# Patient Record
Sex: Female | Born: 1945 | Race: White | Hispanic: No | Marital: Single | Smoking: Former smoker
Health system: Southern US, Community
[De-identification: ages and names within clinical notes are randomized; demographics above are authoritative.]

## PROBLEM LIST (undated history)

## (undated) DIAGNOSIS — E119 Type 2 diabetes mellitus without complications: Secondary | ICD-10-CM

## (undated) DIAGNOSIS — Z9889 Other specified postprocedural states: Secondary | ICD-10-CM

## (undated) DIAGNOSIS — K602 Anal fissure, unspecified: Secondary | ICD-10-CM

## (undated) DIAGNOSIS — I10 Essential (primary) hypertension: Secondary | ICD-10-CM

## (undated) DIAGNOSIS — I2699 Other pulmonary embolism without acute cor pulmonale: Secondary | ICD-10-CM

## (undated) DIAGNOSIS — M899 Disorder of bone, unspecified: Secondary | ICD-10-CM

## (undated) DIAGNOSIS — D3A8 Other benign neuroendocrine tumors: Secondary | ICD-10-CM

## (undated) DIAGNOSIS — E785 Hyperlipidemia, unspecified: Secondary | ICD-10-CM

## (undated) DIAGNOSIS — Z973 Presence of spectacles and contact lenses: Secondary | ICD-10-CM

## (undated) DIAGNOSIS — Z860100 Personal history of colon polyps, unspecified: Secondary | ICD-10-CM

## (undated) DIAGNOSIS — J309 Allergic rhinitis, unspecified: Secondary | ICD-10-CM

## (undated) DIAGNOSIS — R202 Paresthesia of skin: Secondary | ICD-10-CM

## (undated) DIAGNOSIS — N6452 Nipple discharge: Secondary | ICD-10-CM

## (undated) DIAGNOSIS — J45909 Unspecified asthma, uncomplicated: Secondary | ICD-10-CM

## (undated) DIAGNOSIS — K219 Gastro-esophageal reflux disease without esophagitis: Secondary | ICD-10-CM

## (undated) DIAGNOSIS — L409 Psoriasis, unspecified: Secondary | ICD-10-CM

## (undated) DIAGNOSIS — M1990 Unspecified osteoarthritis, unspecified site: Secondary | ICD-10-CM

## (undated) DIAGNOSIS — M419 Scoliosis, unspecified: Secondary | ICD-10-CM

## (undated) DIAGNOSIS — D649 Anemia, unspecified: Secondary | ICD-10-CM

## (undated) DIAGNOSIS — R112 Nausea with vomiting, unspecified: Secondary | ICD-10-CM

## (undated) DIAGNOSIS — G5600 Carpal tunnel syndrome, unspecified upper limb: Secondary | ICD-10-CM

## (undated) DIAGNOSIS — M949 Disorder of cartilage, unspecified: Secondary | ICD-10-CM

## (undated) DIAGNOSIS — I2510 Atherosclerotic heart disease of native coronary artery without angina pectoris: Secondary | ICD-10-CM

## (undated) HISTORY — DX: Gastro-esophageal reflux disease without esophagitis: K21.9

## (undated) HISTORY — DX: Essential (primary) hypertension: I10

## (undated) HISTORY — DX: Type 2 diabetes mellitus without complications: E11.9

## (undated) HISTORY — DX: Unspecified asthma, uncomplicated: J45.909

## (undated) HISTORY — DX: Psoriasis, unspecified: L40.9

## (undated) HISTORY — PX: BREAST EXCISIONAL BIOPSY: SUR124

## (undated) HISTORY — DX: Personal history of colon polyps, unspecified: Z86.0100

## (undated) HISTORY — PX: BREAST BIOPSY: SHX20

## (undated) HISTORY — DX: Hyperlipidemia, unspecified: E78.5

## (undated) HISTORY — DX: Anemia, unspecified: D64.9

## (undated) HISTORY — DX: Allergic rhinitis, unspecified: J30.9

## (undated) HISTORY — DX: Personal history of colonic polyps: Z86.010

## (undated) HISTORY — DX: Disorder of bone, unspecified: M94.9

## (undated) HISTORY — DX: Scoliosis, unspecified: M41.9

## (undated) HISTORY — DX: Disorder of bone, unspecified: M89.9

## (undated) HISTORY — DX: Carpal tunnel syndrome, unspecified upper limb: G56.00

## (undated) HISTORY — DX: Anal fissure, unspecified: K60.2

---

## 1986-02-14 HISTORY — PX: BREAST SURGERY: SHX581

## 1998-06-11 ENCOUNTER — Other Ambulatory Visit: Admission: RE | Admit: 1998-06-11 | Discharge: 1998-06-11 | Payer: Self-pay | Admitting: Obstetrics and Gynecology

## 1999-06-10 ENCOUNTER — Other Ambulatory Visit: Admission: RE | Admit: 1999-06-10 | Discharge: 1999-06-10 | Payer: Self-pay | Admitting: Obstetrics and Gynecology

## 2000-06-12 ENCOUNTER — Other Ambulatory Visit: Admission: RE | Admit: 2000-06-12 | Discharge: 2000-06-12 | Payer: Self-pay | Admitting: Obstetrics and Gynecology

## 2001-12-04 ENCOUNTER — Encounter: Payer: Self-pay | Admitting: Emergency Medicine

## 2001-12-04 ENCOUNTER — Emergency Department (HOSPITAL_COMMUNITY): Admission: EM | Admit: 2001-12-04 | Discharge: 2001-12-04 | Payer: Self-pay | Admitting: Emergency Medicine

## 2002-02-14 HISTORY — PX: OTHER SURGICAL HISTORY: SHX169

## 2002-07-08 ENCOUNTER — Other Ambulatory Visit: Admission: RE | Admit: 2002-07-08 | Discharge: 2002-07-08 | Payer: Self-pay | Admitting: Obstetrics and Gynecology

## 2002-07-19 ENCOUNTER — Ambulatory Visit (HOSPITAL_COMMUNITY): Admission: RE | Admit: 2002-07-19 | Discharge: 2002-07-19 | Payer: Self-pay | Admitting: Gastroenterology

## 2002-09-06 ENCOUNTER — Encounter: Admission: RE | Admit: 2002-09-06 | Discharge: 2002-09-06 | Payer: Self-pay | Admitting: General Surgery

## 2002-09-06 ENCOUNTER — Encounter: Payer: Self-pay | Admitting: General Surgery

## 2003-03-21 ENCOUNTER — Ambulatory Visit (HOSPITAL_BASED_OUTPATIENT_CLINIC_OR_DEPARTMENT_OTHER): Admission: RE | Admit: 2003-03-21 | Discharge: 2003-03-21 | Payer: Self-pay | Admitting: Family Medicine

## 2003-03-21 ENCOUNTER — Encounter: Payer: Self-pay | Admitting: Internal Medicine

## 2003-07-22 ENCOUNTER — Encounter: Admission: RE | Admit: 2003-07-22 | Discharge: 2003-07-22 | Payer: Self-pay | Admitting: Family Medicine

## 2004-01-30 ENCOUNTER — Encounter: Admission: RE | Admit: 2004-01-30 | Discharge: 2004-01-30 | Payer: Self-pay | Admitting: Obstetrics and Gynecology

## 2004-02-20 ENCOUNTER — Ambulatory Visit (HOSPITAL_COMMUNITY): Admission: RE | Admit: 2004-02-20 | Discharge: 2004-02-20 | Payer: Self-pay | Admitting: Gastroenterology

## 2004-07-16 ENCOUNTER — Encounter: Admission: RE | Admit: 2004-07-16 | Discharge: 2004-07-16 | Payer: Self-pay | Admitting: Obstetrics and Gynecology

## 2004-07-28 ENCOUNTER — Encounter: Admission: RE | Admit: 2004-07-28 | Discharge: 2004-07-28 | Payer: Self-pay | Admitting: Obstetrics and Gynecology

## 2005-02-18 ENCOUNTER — Encounter: Admission: RE | Admit: 2005-02-18 | Discharge: 2005-02-18 | Payer: Self-pay | Admitting: Obstetrics and Gynecology

## 2005-09-19 ENCOUNTER — Encounter: Admission: RE | Admit: 2005-09-19 | Discharge: 2005-09-19 | Payer: Self-pay | Admitting: Family Medicine

## 2006-06-15 LAB — CONVERTED CEMR LAB: Pap Smear: NORMAL

## 2006-07-14 ENCOUNTER — Encounter: Payer: Self-pay | Admitting: Internal Medicine

## 2006-09-22 ENCOUNTER — Encounter: Payer: Self-pay | Admitting: Internal Medicine

## 2006-09-22 ENCOUNTER — Encounter: Admission: RE | Admit: 2006-09-22 | Discharge: 2006-09-22 | Payer: Self-pay | Admitting: Obstetrics and Gynecology

## 2006-10-05 ENCOUNTER — Encounter: Admission: RE | Admit: 2006-10-05 | Discharge: 2006-10-05 | Payer: Self-pay | Admitting: Obstetrics and Gynecology

## 2006-11-14 ENCOUNTER — Encounter: Payer: Self-pay | Admitting: Internal Medicine

## 2006-11-15 LAB — HM COLONOSCOPY

## 2007-04-12 ENCOUNTER — Encounter: Admission: RE | Admit: 2007-04-12 | Discharge: 2007-04-12 | Payer: Self-pay | Admitting: Gastroenterology

## 2007-06-21 ENCOUNTER — Encounter: Payer: Self-pay | Admitting: Internal Medicine

## 2007-09-26 ENCOUNTER — Encounter: Payer: Self-pay | Admitting: Internal Medicine

## 2007-11-15 LAB — HM MAMMOGRAPHY: HM Mammogram: NORMAL

## 2007-12-14 ENCOUNTER — Encounter: Admission: RE | Admit: 2007-12-14 | Discharge: 2007-12-14 | Payer: Self-pay | Admitting: Obstetrics and Gynecology

## 2007-12-21 ENCOUNTER — Encounter: Admission: RE | Admit: 2007-12-21 | Discharge: 2007-12-21 | Payer: Self-pay | Admitting: Obstetrics and Gynecology

## 2008-06-03 ENCOUNTER — Ambulatory Visit: Payer: Self-pay | Admitting: Internal Medicine

## 2008-06-03 DIAGNOSIS — E1159 Type 2 diabetes mellitus with other circulatory complications: Secondary | ICD-10-CM | POA: Insufficient documentation

## 2008-06-03 DIAGNOSIS — J45909 Unspecified asthma, uncomplicated: Secondary | ICD-10-CM | POA: Insufficient documentation

## 2008-06-03 DIAGNOSIS — E1169 Type 2 diabetes mellitus with other specified complication: Secondary | ICD-10-CM | POA: Insufficient documentation

## 2008-06-03 DIAGNOSIS — M8580 Other specified disorders of bone density and structure, unspecified site: Secondary | ICD-10-CM | POA: Insufficient documentation

## 2008-06-03 DIAGNOSIS — E119 Type 2 diabetes mellitus without complications: Secondary | ICD-10-CM | POA: Insufficient documentation

## 2008-06-03 DIAGNOSIS — J309 Allergic rhinitis, unspecified: Secondary | ICD-10-CM | POA: Insufficient documentation

## 2008-06-03 DIAGNOSIS — I10 Essential (primary) hypertension: Secondary | ICD-10-CM | POA: Insufficient documentation

## 2008-06-03 DIAGNOSIS — E1122 Type 2 diabetes mellitus with diabetic chronic kidney disease: Secondary | ICD-10-CM | POA: Insufficient documentation

## 2008-06-03 DIAGNOSIS — E785 Hyperlipidemia, unspecified: Secondary | ICD-10-CM | POA: Insufficient documentation

## 2008-06-03 DIAGNOSIS — Z860100 Personal history of colon polyps, unspecified: Secondary | ICD-10-CM | POA: Insufficient documentation

## 2008-06-03 DIAGNOSIS — K219 Gastro-esophageal reflux disease without esophagitis: Secondary | ICD-10-CM | POA: Insufficient documentation

## 2008-06-03 DIAGNOSIS — D649 Anemia, unspecified: Secondary | ICD-10-CM | POA: Insufficient documentation

## 2008-06-03 DIAGNOSIS — G5600 Carpal tunnel syndrome, unspecified upper limb: Secondary | ICD-10-CM | POA: Insufficient documentation

## 2008-06-03 LAB — CONVERTED CEMR LAB: Vit D, 25-Hydroxy: 34 ng/mL (ref 30–89)

## 2008-06-04 LAB — CONVERTED CEMR LAB
ALT: 30 units/L (ref 0–35)
AST: 26 units/L (ref 0–37)
Albumin: 4.2 g/dL (ref 3.5–5.2)
Alkaline Phosphatase: 87 units/L (ref 39–117)
Bilirubin, Direct: 0.2 mg/dL (ref 0.0–0.3)
Cholesterol: 197 mg/dL (ref 0–200)
HDL: 63.1 mg/dL (ref >39.00)
Hgb A1c MFr Bld: 6.2 % (ref 4.6–6.5)
LDL Cholesterol: 113 mg/dL — ABNORMAL HIGH (ref 0–99)
Total Bilirubin: 1.3 mg/dL — ABNORMAL HIGH (ref 0.3–1.2)
Total CHOL/HDL Ratio: 3
Total Protein: 7.8 g/dL (ref 6.0–8.3)
Triglycerides: 106 mg/dL (ref 0.0–149.0)
VLDL: 21.2 mg/dL (ref 0.0–40.0)

## 2008-09-02 LAB — HM DIABETES EYE EXAM: HM Diabetic Eye Exam: NORMAL

## 2008-09-18 ENCOUNTER — Encounter: Payer: Self-pay | Admitting: Internal Medicine

## 2008-10-06 ENCOUNTER — Ambulatory Visit: Payer: Self-pay | Admitting: Internal Medicine

## 2008-10-06 LAB — CONVERTED CEMR LAB: Vit D, 25-Hydroxy: 32 ng/mL (ref 30–89)

## 2008-10-07 ENCOUNTER — Encounter (INDEPENDENT_AMBULATORY_CARE_PROVIDER_SITE_OTHER): Payer: Self-pay | Admitting: *Deleted

## 2008-10-07 LAB — CONVERTED CEMR LAB
ALT: 28 units/L (ref 0–35)
AST: 23 units/L (ref 0–37)
Albumin: 4.2 g/dL (ref 3.5–5.2)
Alkaline Phosphatase: 73 units/L (ref 39–117)
BUN: 20 mg/dL (ref 6–23)
Basophils Absolute: 0.1 10*3/uL (ref 0.0–0.1)
Basophils Relative: 1.9 % (ref 0.0–3.0)
Bilirubin Urine: NEGATIVE
Bilirubin, Direct: 0.1 mg/dL (ref 0.0–0.3)
CO2: 29 meq/L (ref 19–32)
Calcium: 9.8 mg/dL (ref 8.4–10.5)
Chloride: 105 meq/L (ref 96–112)
Cholesterol: 215 mg/dL — ABNORMAL HIGH (ref 0–200)
Creatinine, Ser: 0.8 mg/dL (ref 0.4–1.2)
Direct LDL: 127.3 mg/dL
Eosinophils Absolute: 0.3 10*3/uL (ref 0.0–0.7)
Eosinophils Relative: 4.7 % (ref 0.0–5.0)
GFR calc non Af Amer: 76.52 mL/min (ref >60)
Glucose, Bld: 112 mg/dL — ABNORMAL HIGH (ref 70–99)
HCT: 37.8 % (ref 36.0–46.0)
HDL: 54 mg/dL (ref >39.00)
Hemoglobin: 13.2 g/dL (ref 12.0–15.0)
Hgb A1c MFr Bld: 6 % (ref 4.6–6.5)
Ketones, ur: NEGATIVE mg/dL
Leukocytes, UA: NEGATIVE
Lymphocytes Relative: 27.5 % (ref 12.0–46.0)
Lymphs Abs: 1.5 10*3/uL (ref 0.7–4.0)
MCHC: 34.9 g/dL (ref 30.0–36.0)
MCV: 94.6 fL (ref 78.0–100.0)
Monocytes Absolute: 0.4 10*3/uL (ref 0.1–1.0)
Monocytes Relative: 7.4 % (ref 3.0–12.0)
Neutro Abs: 3.2 10*3/uL (ref 1.4–7.7)
Neutrophils Relative %: 58.5 % (ref 43.0–77.0)
Nitrite: NEGATIVE
Platelets: 243 10*3/uL (ref 150.0–400.0)
Potassium: 3.9 meq/L (ref 3.5–5.1)
RBC: 3.99 M/uL (ref 3.87–5.11)
RDW: 12.8 % (ref 11.5–14.6)
Sodium: 143 meq/L (ref 135–145)
Specific Gravity, Urine: <=1.005 (ref 1.000–1.030)
TSH: 3.03 microintl units/mL (ref 0.35–5.50)
Total Bilirubin: 1.3 mg/dL — ABNORMAL HIGH (ref 0.3–1.2)
Total CHOL/HDL Ratio: 4
Total Protein, Urine: NEGATIVE mg/dL
Total Protein: 8.5 g/dL — ABNORMAL HIGH (ref 6.0–8.3)
Triglycerides: 141 mg/dL (ref 0.0–149.0)
Urine Glucose: NEGATIVE mg/dL
Urobilinogen, UA: 0.2 (ref 0.0–1.0)
VLDL: 28.2 mg/dL (ref 0.0–40.0)
WBC: 5.5 10*3/uL (ref 4.5–10.5)
pH: 6 (ref 5.0–8.0)

## 2009-03-03 ENCOUNTER — Emergency Department (HOSPITAL_COMMUNITY): Admission: EM | Admit: 2009-03-03 | Discharge: 2009-03-03 | Payer: Self-pay | Admitting: Emergency Medicine

## 2009-03-09 ENCOUNTER — Ambulatory Visit: Payer: Self-pay | Admitting: Internal Medicine

## 2009-03-09 DIAGNOSIS — S0100XA Unspecified open wound of scalp, initial encounter: Secondary | ICD-10-CM | POA: Insufficient documentation

## 2009-06-26 ENCOUNTER — Encounter: Admission: RE | Admit: 2009-06-26 | Discharge: 2009-06-26 | Payer: Self-pay | Admitting: Obstetrics and Gynecology

## 2009-10-13 ENCOUNTER — Ambulatory Visit: Payer: Self-pay | Admitting: Internal Medicine

## 2009-10-13 ENCOUNTER — Encounter: Payer: Self-pay | Admitting: Internal Medicine

## 2009-10-13 DIAGNOSIS — R5381 Other malaise: Secondary | ICD-10-CM | POA: Insufficient documentation

## 2009-10-13 DIAGNOSIS — R5383 Other fatigue: Secondary | ICD-10-CM

## 2009-10-13 LAB — CONVERTED CEMR LAB
ALT: 38 units/L — ABNORMAL HIGH (ref 0–35)
AST: 30 units/L (ref 0–37)
Albumin: 4.2 g/dL (ref 3.5–5.2)
Alkaline Phosphatase: 79 units/L (ref 39–117)
BUN: 21 mg/dL (ref 6–23)
Basophils Absolute: 0.1 10*3/uL (ref 0.0–0.1)
Basophils Relative: 1.4 % (ref 0.0–3.0)
Bilirubin Urine: NEGATIVE
Bilirubin, Direct: 0.1 mg/dL (ref 0.0–0.3)
CO2: 26 meq/L (ref 19–32)
Calcium: 9.6 mg/dL (ref 8.4–10.5)
Chloride: 105 meq/L (ref 96–112)
Cholesterol: 271 mg/dL — ABNORMAL HIGH (ref 0–200)
Creatinine, Ser: 0.8 mg/dL (ref 0.4–1.2)
Direct LDL: 172 mg/dL
Eosinophils Absolute: 0.2 10*3/uL (ref 0.0–0.7)
Eosinophils Relative: 4.1 % (ref 0.0–5.0)
GFR calc non Af Amer: 80.93 mL/min (ref >60)
Glucose, Bld: 102 mg/dL — ABNORMAL HIGH (ref 70–99)
HCT: 37.9 % (ref 36.0–46.0)
HDL: 45.8 mg/dL (ref >39.00)
Hemoglobin: 13.2 g/dL (ref 12.0–15.0)
Hgb A1c MFr Bld: 6.2 % (ref 4.6–6.5)
Ketones, ur: NEGATIVE mg/dL
Leukocytes, UA: NEGATIVE
Lymphocytes Relative: 25.3 % (ref 12.0–46.0)
Lymphs Abs: 1.4 10*3/uL (ref 0.7–4.0)
MCHC: 34.8 g/dL (ref 30.0–36.0)
MCV: 94 fL (ref 78.0–100.0)
Monocytes Absolute: 0.4 10*3/uL (ref 0.1–1.0)
Monocytes Relative: 6.8 % (ref 3.0–12.0)
Neutro Abs: 3.5 10*3/uL (ref 1.4–7.7)
Neutrophils Relative %: 62.4 % (ref 43.0–77.0)
Nitrite: NEGATIVE
Platelets: 261 10*3/uL (ref 150.0–400.0)
Potassium: 4.6 meq/L (ref 3.5–5.1)
RBC: 4.03 M/uL (ref 3.87–5.11)
RDW: 13.8 % (ref 11.5–14.6)
Sodium: 142 meq/L (ref 135–145)
Specific Gravity, Urine: <=1.005 (ref 1.000–1.030)
TSH: 2.42 microintl units/mL (ref 0.35–5.50)
Total Bilirubin: 0.8 mg/dL (ref 0.3–1.2)
Total CHOL/HDL Ratio: 6
Total Protein, Urine: NEGATIVE mg/dL
Total Protein: 7.2 g/dL (ref 6.0–8.3)
Triglycerides: 207 mg/dL — ABNORMAL HIGH (ref 0.0–149.0)
Urine Glucose: NEGATIVE mg/dL
Urobilinogen, UA: 0.2 (ref 0.0–1.0)
VLDL: 41.4 mg/dL — ABNORMAL HIGH (ref 0.0–40.0)
WBC: 5.6 10*3/uL (ref 4.5–10.5)
pH: 6 (ref 5.0–8.0)

## 2009-10-14 ENCOUNTER — Encounter (INDEPENDENT_AMBULATORY_CARE_PROVIDER_SITE_OTHER): Payer: Self-pay | Admitting: *Deleted

## 2009-10-14 LAB — CONVERTED CEMR LAB: Vit D, 25-Hydroxy: 35 ng/mL (ref 30–89)

## 2010-03-07 ENCOUNTER — Encounter: Payer: Self-pay | Admitting: Obstetrics and Gynecology

## 2010-03-16 NOTE — Assessment & Plan Note (Signed)
Summary: cpx,uhc,$50,cd   Vital Signs:  Patient profile:   65 year old female Height:      62 inches (157.48 cm) Weight:      134.0 pounds (60.91 kg) O2 Sat:      98 % Temp:     98.7 degrees F (37.06 degrees C) oral Pulse rate:   76 / minute BP sitting:   132 / 94  (left arm) Cuff size:   regular  Vitals Entered By: Orlan Leavens (October 06, 2008 8:16 AM) CC: CPX Is Patient Diabetic? Yes  Pain Assessment Patient in pain? no        Primary Care Provider:  Newt Lukes MD  CC:  CPX.  History of Present Illness: patient is here today for annual physical. Patient feels well and has no complaints.  gyn exam sched next mo, eye exam last mo, mammo done <81yr/colo done <10y no problems with meds  not compliant with diet/exercise since last visit  Preventive Screening-Counseling & Management  Alcohol-Tobacco     Alcohol drinks/day: 0     Smoking Status: quit > 6 months     Year Quit: 1976  Clinical Review Panels:  Prevention   Last Mammogram:  normal (11/15/2007)   Last Pap Smear:  normal (06/15/2006)   Last Colonoscopy:  Polyp (11/15/2006)  Immunizations   Last Tetanus Booster:  Td (02/15/2004)   Last Pneumovax:  given (02/14/2002)  Lipid Management   Cholesterol:  197 (06/03/2008)   LDL (bad choesterol):  113 (06/03/2008)   HDL (good cholesterol):  63.10 (06/03/2008)  Diabetes Management   HgBA1C:  6.2 (06/03/2008)   Last Dilated Eye Exam:  normal (09/02/2008)   Last Foot Exam:  yes (10/06/2008)   Last Pneumovax:  given (02/14/2002)   Current Medications (verified): 1)  Pravastatin Sodium 20 Mg Tabs (Pravastatin Sodium) .... Take 1 By Mouth Qd 2)  Prilosec 20 Mg Cpdr (Omeprazole) .... Take 1 By Mouth Qd 3)  Vitamin D3 1000 Unit Tabs (Cholecalciferol) .... Take 1 By Mouth Qd 4)  Coq10 200 Mg Caps (Coenzyme Q10) .... Take 1 By Mouth Qd 5)  Oscal 500/200 D-3 500-200 Mg-Unit Tabs (Calcium-Vitamin D) .... Take 1 By Mouth Qd 6)  Afrin Nasal Spray 0.05 % Soln  (Oxymetazoline Hcl) .... Use Once Daily 7)  Finacea 15 % Gel (Azelaic Acid) .... Apply Once Daily 8)  Clobetasol Propionate 0.05 % Soln (Clobetasol Propionate) .... Use Two Times A Day 9)  Capex 0.01 % Sham (Fluocinolone Acetonide) .... Use Two Times A Week 10)  Benicar 20 Mg Tabs (Olmesartan Medoxomil) .Marland Kitchen.. 1 By Mouth Daily 11)  Halobetasol Propionate 0.05 % Crea (Halobetasol Propionate) .... Apply 3-4 Times A Week 12)  Nasonex 50 Mcg/act Susp (Mometasone Furoate) .... Use 2-3 Times Q Week 13)  Omega 3 1280mg   Mg Cpdr (Omega-3 Fatty Acids) .... Take 2 By Mouth Qd  Allergies (verified): 1)  ! * Biaxin 2)  ! * E-Mycins 3)  ! Tetracycline 4)  ! Codeine PMH-FH-SH reviewed-no changes except otherwise noted  Review of Systems       see HPI above. I have reviewed all other systems and they were negative.   Physical Exam  General:  alert, well-developed, well-nourished, and cooperative to examination.  slightly overweight-appearing.   Eyes:  vision grossly intact; pupils equal, round and reactive to light.  conjunctiva and lids normal.   ears corrective lenses Ears:  normal pinnae bilaterally, without erythema, swelling, or tenderness to palpation. TMs clear, without effusion,  or cerumen impaction. Hearing grossly normal bilaterally  Mouth:  Oral mucosa and oropharynx without lesions or exudates.  Teeth in good repair. Lungs:  normal respiratory effort, no intercostal retractions or use of accessory muscles; normal breath sounds bilaterally - no crackles and no wheezes.    Heart:  normal rate, regular rhythm, no murmur, and no rub. BLE without edema. normal DP pulses and normal cap refill in all 4 extremities    Abdomen:  soft, non-tender, normal bowel sounds, no distention; no masses and no appreciable hepatomegaly or splenomegaly.   Rectal:  defer to gyn Genitalia:  defer to gyn Msk:  no gross deformities or acute effusions -  Neurologic:  alert & oriented X3 and cranial nerves II-XII  symetrically intact.  strength normal in all extremities, sensation intact to light touch, and gait normal. speech fluent without dysarthria or aphasia  follows commands with good comprehension.  Skin:  no rashes, vesicles, ulcers, or erythema. No nodules or irregularity to palpation. smalll AK L lateral thigh without irritation Cervical Nodes:  No lymphadenopathy noted Axillary Nodes:  No palpable lymphadenopathy Inguinal Nodes:  No significant adenopathy Psych:  Oriented X3, memory intact for recent and remote, normally interactive, good eye contact, not anxious appearing, not depressed appearing, and not agitated.     Diabetes Management Exam:    Foot Exam (with socks and/or shoes not present):       Sensory-Pinprick/Light touch:          Left medial foot (L-4): normal          Left dorsal foot (L-5): normal          Left lateral foot (S-1): normal          Right medial foot (L-4): normal          Right dorsal foot (L-5): normal          Right lateral foot (S-1): normal       Sensory-Monofilament:          Left foot: normal          Right foot: normal       Inspection:          Left foot: normal          Right foot: normal       Nails:          Left foot: normal          Right foot: normal    Eye Exam:       Eye Exam done elsewhere          Date: 09/02/2008          Results: normal          Done by: dr. Rondel Baton   Impression & Recommendations:  Problem # 1:  PREVENTIVE HEALTH CARE (ICD-V70.0) Patient has been counseled on age-appropriate routine health concerns for screening and prevention.  These are reviewed and up-to-date.  Immunizations are up-to-date or declined. Labs ordered today  and ECG reviewed.   Problem # 2:  HYPERTENSION (ICD-401.9)  Her updated medication list for this problem includes:    Benicar 20 Mg Tabs (Olmesartan medoxomil) .Marland Kitchen... 1 by mouth daily  Orders: EKG w/ Interpretation (93000)  BP today: 132/94 Prior BP: 132/98 (06/03/2008)  Labs  Reviewed: Chol: 197 (06/03/2008)   HDL: 63.10 (06/03/2008)   LDL: 113 (06/03/2008)   TG: 106.0 (06/03/2008)  Problem # 3:  HYPERLIPIDEMIA (ICD-272.4)  Her updated medication list for this  problem includes:    Pravastatin Sodium 20 Mg Tabs (Pravastatin sodium) .Marland Kitchen... Take 1 by mouth qd  Labs Reviewed: SGOT: 26 (06/03/2008)   SGPT: 30 (06/03/2008)   HDL:63.10 (06/03/2008)  LDL:113 (06/03/2008)  Chol:197 (06/03/2008)  Trig:106.0 (06/03/2008)  Problem # 4:  CARPAL TUNNEL SYNDROME (ICD-354.0) still occ L hand symptoms but not frequwnt or troublesome to everyday life cont as needed survvellience w/o PNCS at this time  Problem # 5:  DIABETES MELLITUS, TYPE II (ICD-250.00)  Her updated medication list for this problem includes:    Benicar 20 Mg Tabs (Olmesartan medoxomil) .Marland Kitchen... 1 by mouth daily  Orders: TLB-A1C / Hgb A1C (Glycohemoglobin) (83036-A1C)  Last Eye Exam: normal (09/02/2008) Reviewed HgBA1c results: 6.2 (06/03/2008)  Complete Medication List: 1)  Pravastatin Sodium 20 Mg Tabs (Pravastatin sodium) .... Take 1 by mouth qd 2)  Prilosec 20 Mg Cpdr (Omeprazole) .... Take 1 by mouth qd 3)  Vitamin D3 1000 Unit Tabs (Cholecalciferol) .... Take 1 by mouth qd 4)  Coq10 200 Mg Caps (Coenzyme q10) .... Take 1 by mouth qd 5)  Oscal 500/200 D-3 500-200 Mg-unit Tabs (Calcium-vitamin d) .... Take 1 by mouth qd 6)  Afrin Nasal Spray 0.05 % Soln (Oxymetazoline hcl) .... Use once daily 7)  Finacea 15 % Gel (Azelaic acid) .... Apply once daily 8)  Clobetasol Propionate 0.05 % Soln (Clobetasol propionate) .... Use two times a day 9)  Capex 0.01 % Sham (Fluocinolone acetonide) .... Use two times a week 10)  Benicar 20 Mg Tabs (Olmesartan medoxomil) .Marland Kitchen.. 1 by mouth daily 11)  Halobetasol Propionate 0.05 % Crea (Halobetasol propionate) .... Apply 3-4 times a week 12)  Nasonex 50 Mcg/act Susp (Mometasone furoate) .... Use 2-3 times q week 13)  Omega 3 1280mg  Mg Cpdr (omega-3 Fatty Acids)  .... Take  2 by mouth qd 14)  Onetouch Test Strp (Glucose blood) .... Check two times a day 15)  Onetouch Lancets Misc (Lancets) .... Use as directed  Other Orders: TLB-CBC Platelet - w/Differential (85025-CBCD) TLB-TSH (Thyroid Stimulating Hormone) (84443-TSH) TLB-Lipid Panel (80061-LIPID) TLB-BMP (Basic Metabolic Panel-BMET) (80048-METABOL) TLB-Hepatic/Liver Function Pnl (80076-HEPATIC) T-Vitamin D (25-Hydroxy) ([address]) TLB-Udip ONLY (81003-UDIP)  Patient Instructions: 1)  physical labs today - you will be mailed a copy of these for review 2)  followup 3-4 months, sooner if problems 3)  refills on meds as discussed today Prescriptions: ONETOUCH LANCETS  MISC (LANCETS) use as directed  #100 x 11   Entered by:   Orlan Leavens   Authorized by:   Newt Lukes MD   Signed by:   Orlan Leavens on 10/06/2008   Method used:   Faxed to ...       [facility] (retail)       [address] [place]       [address]       [place], [place]  [address]       Ph: [phone]       Fax: [phone]   RxID:   [id number] [facility] TEST  STRP (GLUCOSE BLOOD) check two times a day  #100 x 11   Entered by:   Orlan Leavens   Authorized by:   Newt Lukes MD   Signed by:   Orlan Leavens on 10/06/2008   Method used:   Faxed to ...       [facility] (retail)       [address] [employer]       [address]  [place], [place]  [address]       Ph: [phone]       Fax: [phone]   RxID:   [id number] NASONEX 50 MCG/ACT SUSP (MOMETASONE FUROATE) use 2-3 times q week  #1 x 5   Entered and Authorized by:   Newt Lukes MD   Signed by:   Newt Lukes MD on 10/06/2008   Method used:   Faxed to ...       [facility] (retail)       [address] [place]       [address]       [place], [place]  [address]       Ph: [phone]       Fax: [phone]   RxID:   [id number] BENICAR 20 MG TABS (OLMESARTAN MEDOXOMIL) 1 by mouth daily  #30 x 11   Entered and Authorized by:   Newt Lukes MD    Signed by:   Newt Lukes MD on 10/06/2008   Method used:   Faxed to ...       [facility] (retail)       [address] [place]       [address]       [place], [place]  [address]       Ph: [phone]       Fax: [phone]   RxID:   [id number] PRAVASTATIN SODIUM 20 MG TABS (PRAVASTATIN SODIUM) TAKE 1 by mouth QD  #30 x 11   Entered and Authorized by:   Newt Lukes MD   Signed by:   Newt Lukes MD on 10/06/2008   Method used:   Faxed to ...       [facility] (retail)       [address] [place]       [address]       [place], [place]  [address]       Ph: [phone]       Fax: [phone]   RxID:   [id number]

## 2010-03-16 NOTE — Letter (Signed)
Summary: Lipid Letter  [facility]  [address] [place], [place] [address]   Phone: [phone]  Fax: [phone]    10/07/2008  Jennise Both [address] [place], [place]  [address]  Dear Ms. [place]mouth:  We have carefully reviewed your last lipid profile from 10/06/08 and the results are noted below with a summary of recommendations for lipid management.    Cholesterol:       215       HDL "good" Cholesterol:   57.84       LDL "bad" Cholesterol:   113       Triglycerides:       141.0      Labs are all within appropriate range-no medicine changes recommended. Attached a copy of your labs for your record.    LIFESTYLE RECOMMENDATIONS   TLC Diet (Therapeutic Lifestyle Change): Saturated Fats & Transfatty acids should be kept < 7% of total calories ***Reduce Saturated Fats Polyunstaurated Fat can be up to 10% of total calories Monounsaturated Fat Fat can be up to 20% of total calories Total Fat should be no greater than 25-35% of total calories Carbohydrates should be 50-60% of total calories Protein should be approximately 15% of total calories Fiber should be at least 20-30 grams a day ***Increased fiber may help lower LDL Total Cholesterol should be < 200mg /day Consider adding plant stanol/sterols to diet (example: Benacol spread) ***A higher intake of unsaturated fat may reduce Triglycerides and Increase HDL    Adjunctive Measures (may lower LIPIDS and reduce risk of Heart Attack) include: Aerobic Exercise (20-30 minutes 3-4 times a week) Limit Alcohol Consumption Weight Reduction Aspirin 75-81 mg a day by mouth (if not allergic or contraindicated) Dietary Fiber 20-30 grams a day by mouth     Current Medications: 1)    Pravastatin Sodium 20 Mg Tabs (Pravastatin sodium) .... Take 1 by mouth qd 2)    Prilosec 20 Mg Cpdr (Omeprazole) .... Take 1 by mouth qd 3)    Vitamin D3 1000 Unit Tabs (Cholecalciferol) .... Take 1 by mouth qd 4)     Coq10 200 Mg Caps (Coenzyme q10) .... Take 1 by mouth qd 5)    Oscal 500/200 D-3 500-200 Mg-unit Tabs (Calcium-vitamin d) .... Take 1 by mouth qd 6)    Afrin Nasal Spray 0.05 % Soln (Oxymetazoline hcl) .... Use once daily 7)    Finacea 15 % Gel (Azelaic acid) .... Apply once daily 8)    Clobetasol Propionate 0.05 % Soln (Clobetasol propionate) .... Use two times a day 9)    Capex 0.01 % Sham (Fluocinolone acetonide) .... Use two times a week 10)    Benicar 20 Mg Tabs (Olmesartan medoxomil) .Marland Kitchen.. 1 by mouth daily 11)    Halobetasol Propionate 0.05 % Crea (Halobetasol propionate) .... Apply 3-4 times a week 12)    Nasonex 50 Mcg/act Susp (Mometasone furoate) .... Use 2-3 times q week 13)    Omega 3 1280mg   Mg Cpdr (omega-3 Fatty Acids)  .... Take 2 by mouth qd 14)    Onetouch Test  Strp (Glucose blood) .... Check two times a day 15)    Onetouch Lancets  Misc (Lancets) .... Use as directed  If you have any questions, please call. We appreciate being able to work with you.   Sincerely,    [facility] Newt Lukes MD

## 2010-03-16 NOTE — Letter (Signed)
Summary: Mazzocchi,MD  Mazzocchi,MD   Imported By: Lester Webster 10/21/2008 10:53:00  _____________________________________________________________________  External Attachment:    Type:   Image     Comment:   External Document

## 2010-03-16 NOTE — Assessment & Plan Note (Signed)
Summary: YEARLY FU/ MEDICARE/ LABS SAME DAY /NWS  #   Vital Signs:  Patient profile:   65 year old female Height:      62 inches (157.48 cm) Weight:      137.0 pounds (62.27 kg) O2 Sat:      96 % on Room air Temp:     98.3 degrees F (36.83 degrees C) oral Pulse rate:   80 / minute BP sitting:   122 / 84  (left arm) Cuff size:   regular  Vitals Entered By: Orlan Leavens RMA (October 13, 2009 8:18 AM)  O2 Flow:  Room air CC: Yearly CPX Is Patient Diabetic? Yes Did you bring your meter with you today? No Pain Assessment Patient in pain? no       Does patient need assistance? Functional Status Self care Ambulation Normal Comments Need renewal on all meds   Primary Care Provider:  Newt Lukes MD  CC:  Yearly CPX.  History of Present Illness: Here for wellness Diet: Heart Healthy or DM if diabetic Physical Activities: Sedentary Depression/mood screen: Negative Hearing:grosslly normal Visual Acuity: Grossly normal, wears reading glasses, sees optho yearly  ADL's: Capable  Fall Risk: None Home Safety: Good End-of-Life Planning: Advance directive - Full code/I agree  also reviewed chronic med issues:  HTN - concerned her BP has been running higher than usual - stopped all her meds mid Nov 2010 due to muscle and bone fatigue - since head trauma 02/2009, feels BP running higher than usual so resumed benicar- no CP, or vision changes; mild occassional right side HA (relieved with ibuprofen)- mild fatigue and muscle c/o persist wether on or off medications  dyslipidemia - feels omega 3 increases her bleeding risk -  increase in telectasias (face, arms) while on this medication and esp while on ibuprofen did not feel different when off pravastatin (re: muscle fatigue) so resumed same last OV  DM2 - diet controlled  Preventive Screening-Counseling & Management  Alcohol-Tobacco     Alcohol drinks/day: 0     Smoking Status: quit > 6 months     Year Quit:  1976  Caffeine-Diet-Exercise     Caffeine use/day: 1     Caffeine Counseling: not indicated; caffeine use is not excessive or problematic     Does Patient Exercise: yes     Type of exercise: walk     Exercise (avg: min/session): 30-60     Times/week: 5     Exercise Counseling: not indicated; exercise is adequate     Depression Counseling: not indicated; screening negative for depression  Safety-Violence-Falls     Violence Counseling: not applicable     Fall Risk Counseling: not indicated; no significant falls noted  Clinical Review Panels:  Prevention   Last Mammogram:  normal (11/15/2007)   Last Pap Smear:  normal (06/15/2006)   Last Colonoscopy:  Polyp (11/15/2006)  Immunizations   Last Tetanus Booster:  Td (02/15/2004)   Last Pneumovax:  given (02/14/2002)  Lipid Management   Cholesterol:  215 (10/06/2008)   LDL (bad choesterol):  113 (06/03/2008)   HDL (good cholesterol):  54.00 (10/06/2008)  Diabetes Management   HgBA1C:  6.0 (10/06/2008)   Creatinine:  0.8 (10/06/2008)   Last Dilated Eye Exam:  normal (09/02/2008)   Last Foot Exam:  yes (10/13/2009)   Last Pneumovax:  given (02/14/2002)  CBC   WBC:  5.5 (10/06/2008)   RBC:  3.99 (10/06/2008)   Hgb:  13.2 (10/06/2008)   Hct:  37.8 (10/06/2008)  Platelets:  243.0 (10/06/2008)   MCV  94.6 (10/06/2008)   MCHC  34.9 (10/06/2008)   RDW  12.8 (10/06/2008)   PMN:  58.5 (10/06/2008)   Lymphs:  27.5 (10/06/2008)   Monos:  7.4 (10/06/2008)   Eosinophils:  4.7 (10/06/2008)   Basophil:  1.9 (10/06/2008)  Complete Metabolic Panel   Glucose:  112 (10/06/2008)   Sodium:  143 (10/06/2008)   Potassium:  3.9 (10/06/2008)   Chloride:  105 (10/06/2008)   CO2:  29 (10/06/2008)   BUN:  20 (10/06/2008)   Creatinine:  0.8 (10/06/2008)   Albumin:  4.2 (10/06/2008)   Total Protein:  8.5 (10/06/2008)   Calcium:  9.8 (10/06/2008)   Total Bili:  1.3 (10/06/2008)   Alk Phos:  73 (10/06/2008)   SGPT (ALT):  28 (10/06/2008)    SGOT (AST):  23 (10/06/2008)   Current Medications (verified): 1)  Afrin Nasal Spray 0.05 % Soln (Oxymetazoline Hcl) .... Use Once Daily 2)  Finacea 15 % Gel (Azelaic Acid) .... Apply Once Daily 3)  Clobetasol Propionate 0.05 % Soln (Clobetasol Propionate) .... Use Two Times A Day 4)  Capex 0.01 % Sham (Fluocinolone Acetonide) .... Use Two Times A Week 5)  Halobetasol Propionate 0.05 % Crea (Halobetasol Propionate) .... Apply 3-4 Times A Week 6)  Nasonex 50 Mcg/act Susp (Mometasone Furoate) .... Use 2-3 Times Q Week 7)  Onetouch Test  Strp (Glucose Blood) .... Check Two Times A Day 8)  Onetouch Lancets  Misc (Lancets) .... Use As Directed 9)  Prilosec 20 Mg Cpdr (Omeprazole) .... Take 1 By Mouth Qd 10)  Benicar 20 Mg Tabs (Olmesartan Medoxomil) .... 2 By Mouth Daily (Or As Directed) 11)  Pravastatin Sodium 20 Mg Tabs (Pravastatin Sodium) .Marland Kitchen.. 1 By Mouth Once Daily 12)  Aspirin 81 Mg Tabs (Aspirin) .... Take 1 By Mouth Once Daily 13)  Multivitamins  Tabs (Multiple Vitamin) .... Take 1 By Mouth Once Daily  Allergies (verified): 1)  ! * Biaxin 2)  ! * E-Mycins 3)  ! Tetracycline 4)  ! Codeine  Past History:  Past medical, surgical, family and social histories (including risk factors) reviewed, and no changes noted (except as noted below).  Past Medical History: Allergic rhinitis Anemia-NOS Asthma Colonic polyps, hx of Diabetes mellitus, type II, diet controlled GERD Hyperlipidemia Hypertension Rectal fissue  Past Surgical History: Reviewed history from 06/03/2008 and no changes required. Caesarean section Breast biopsy (1988) Benign rectal growth in 2004 removed by Dr.Rosenbower  Family History: Reviewed history from 06/03/2008 and no changes required. Family History of Arthritis (parents) Family History of Asthma (parent & other blood relative) Family History Breast cancer 1st degree relative <50 (other blood relative) Family History Hypertension (parent & other blood  relative) Family History Lung cancer (father) Family History of Sudden Death (father died at age 88 due to lung cancer) Heart Disease (parent & other blood relative)  Social History: Reviewed history from 06/03/2008 and no changes required. lives alone Remote smoker, none since 22. No alcohol. Retired Child psychotherapist  Review of Systems       see HPI above. I have reviewed all other systems and they were negative.   Physical Exam  General:  alert, well-developed, well-nourished, and cooperative to examination.  slightly overweight-appearing.   Head:  Normocephalic and atraumatic without obvious abnormalities. No apparent alopecia or balding. Eyes:  vision grossly intact; pupils equal, round and reactive to light.  conjunctiva and lids normal.   ears corrective lenses Ears:  normal pinnae bilaterally,  without erythema, swelling, or tenderness to palpation. TMs clear, without effusion, or cerumen impaction. Hearing grossly normal bilaterally  Mouth:  Oral mucosa and oropharynx without lesions or exudates.  Teeth in good repair. Neck:  supple, full ROM, no masses, no thyromegaly; no thyroid nodules or tenderness. no JVD or carotid bruits.   Lungs:  normal respiratory effort, no intercostal retractions or use of accessory muscles; normal breath sounds bilaterally - no crackles and no wheezes.    Heart:  normal rate, regular rhythm, no murmur, and no rub. BLE without edema. normal DP pulses and normal cap refill in all 4 extremities    Abdomen:  soft, non-tender, normal bowel sounds, no distention; no masses and no appreciable hepatomegaly or splenomegaly.   Genitalia:  defer Msk:  no gross deformities or acute effusions -  Neurologic:  alert & oriented X3 and cranial nerves II-XII symetrically intact.  strength normal in all extremities, sensation intact to light touch, and gait normal. speech fluent without dysarthria or aphasia  follows commands with good comprehension.  Skin:  no rashes,  vesicles, ulcers, or erythema. No nodules or irregularity to palpation.  Psych:  Oriented X3, memory intact for recent and remote, normally interactive, good eye contact, not anxious appearing, not depressed appearing, and not agitated.     Diabetes Management Exam:    Foot Exam (with socks and/or shoes not present):       Sensory-Pinprick/Light touch:          Left medial foot (L-4): normal          Left dorsal foot (L-5): normal          Left lateral foot (S-1): normal          Right medial foot (L-4): normal          Right dorsal foot (L-5): normal          Right lateral foot (S-1): normal       Sensory-Monofilament:          Left foot: normal          Right foot: normal       Inspection:          Left foot: normal          Right foot: normal       Nails:          Left foot: normal          Right foot: normal   Impression & Recommendations:  Problem # 1:  PREVENTIVE HEALTH CARE (ICD-V70.0) Patient has been counseled on age-appropriate routine health concerns for screening and prevention. These are reviewed and up-to-date. Immunizations are up-to-date or declined. Labs and ECG reviewed.  Risk factors for depression per USPSTF are reviewed and negative;Patient hearing function and visual acuity is screened today; ADLs are reviewed and addressed as needed; functional ability and level of safety have been reviewed and are appropriate. Education, counseling, and referrals are performed based on identified issues/dx - see below Orders: EKG w/ Interpretation (93000) Medicare -1st Annual Wellness Visit [phone])  Problem # 2:  HYPERTENSION (ICD-401.9)  Her updated medication list for this problem includes:    Benicar 20 Mg Tabs (Olmesartan medoxomil) .Marland Kitchen... 2 by mouth daily (or as directed)  BP today: 122/84 Prior BP: 162/110 (03/09/2009)  Labs Reviewed: K+: 3.9 (10/06/2008) Creat: : 0.8 (10/06/2008)   Chol: 215 (10/06/2008)   HDL: 54.00 (10/06/2008)   LDL: 113 (06/03/2008)   TG:  141.0 (10/06/2008)  Orders:  TLB-BMP (Basic Metabolic Panel-BMET) (80048-METABOL) TLB-Udip w/ Micro (81001-URINE)  Problem # 3:  HYPERLIPIDEMIA (ICD-272.4)  Her updated medication list for this problem includes:    Pravastatin Sodium 20 Mg Tabs (Pravastatin sodium) .Marland Kitchen... 1 by mouth once daily  Labs Reviewed: SGOT: 23 (10/06/2008)   SGPT: 28 (10/06/2008)   HDL:54.00 (10/06/2008), 63.10 (06/03/2008)  LDL:113 (06/03/2008)  Chol:215 (10/06/2008), 197 (06/03/2008)  Trig:141.0 (10/06/2008), 106.0 (06/03/2008)  as Mskel symptoms not improved off statin, so resumed the statin now esp with other med risk factors for ASD pt agrees but will remain off omega 3  Problem # 4:  DIABETES MELLITUS, TYPE II (ICD-250.00)  has been diet controlled to dat e- monitor a1c Her updated medication list for this problem includes:    Benicar 20 Mg Tabs (Olmesartan medoxomil) .Marland Kitchen... 2 by mouth daily (or as directed)    Aspirin 81 Mg Tabs (Aspirin) .Marland Kitchen... Take 1 by mouth once daily  Labs Reviewed: Creat: 0.8 (10/06/2008)     Last Eye Exam: normal (09/02/2008) Reviewed HgBA1c results: 6.0 (10/06/2008)  6.2 (06/03/2008)  Orders: TLB-A1C / Hgb A1C (Glycohemoglobin) (83036-A1C)  Problem # 5:  OSTEOPENIA (ICD-733.90)  diagnosed by previous bone scans per patient report check vitamin D level today given history of low level.  cont vit d supplement as ongoing with recommended calcium 1000 mg daily  Orders: T-Vitamin D (25-Hydroxy) (32951-88416)  Problem # 6:  FATIGUE (ICD-780.79)  Orders: TLB-BMP (Basic Metabolic Panel-BMET) (80048-METABOL) TLB-CBC Platelet - w/Differential (85025-CBCD) TLB-TSH (Thyroid Stimulating Hormone) (84443-TSH) TLB-Hepatic/Liver Function Pnl (80076-HEPATIC)  Complete Medication List: 1)  Afrin Nasal Spray 0.05 % Soln (Oxymetazoline hcl) .... Use once daily 2)  Finacea 15 % Gel (Azelaic acid) .... Apply once daily 3)  Clobetasol Propionate 0.05 % Soln (Clobetasol propionate)  .... Use two times a day 4)  Capex 0.01 % Sham (Fluocinolone acetonide) .... Use two times a week 5)  Halobetasol Propionate 0.05 % Crea (Halobetasol propionate) .... Apply 3-4 times a week 6)  Nasonex 50 Mcg/act Susp (Mometasone furoate) .... Use 2-3 times q week 7)  Onetouch Test Strp (Glucose blood) .... Check two times a day 8)  Onetouch Lancets Misc (Lancets) .... Use as directed 9)  Prilosec 20 Mg Cpdr (Omeprazole) .... Take 1 by mouth qd 10)  Benicar 20 Mg Tabs (Olmesartan medoxomil) .... 2 by mouth daily (or as directed) 11)  Pravastatin Sodium 20 Mg Tabs (Pravastatin sodium) .Marland Kitchen.. 1 by mouth once daily 12)  Aspirin 81 Mg Tabs (Aspirin) .... Take 1 by mouth once daily 13)  Multivitamins Tabs (Multiple vitamin) .... Take 1 by mouth once daily  Other Orders: TLB-Lipid Panel (80061-LIPID)  Patient Instructions: 1)  it was good to see you today. 2)  test(s) ordered today - your results will be mailed to you after review in 48-72 hours from the time of test completion; if any changes need to be made or there are abnormal results, you will be contacted directly.  3)  Please schedule a follow-up appointment in 3-6 months to monitor a1c and diet/weight, call sooner if problems.  4)  refills on medications done as discussed Prescriptions: NASONEX 50 MCG/ACT SUSP (MOMETASONE FUROATE) use 2-3 times q week  #1 x 5   Entered by:   Orlan Leavens RMA   Authorized by:   Newt Lukes MD   Signed by:   Orlan Leavens RMA on 10/13/2009   Method used:   Faxed to ...       [facility] (retail)  [address]       [address]       [place], [place]  [address]       Ph: [phone]       Fax: [phone]   RxID:   [id number] PRAVASTATIN SODIUM 20 MG TABS (PRAVASTATIN SODIUM) 1 by mouth once daily  #30 x 11   Entered by:   Orlan Leavens RMA   Authorized by:   Newt Lukes MD   Signed by:   Orlan Leavens RMA on 10/13/2009   Method used:   Faxed to ...       [facility]  (retail)       [address] [place]       [address]       [place], [place]  [address]       Ph: [phone]       Fax: [phone]   RxID:   [id number] BENICAR 20 MG TABS (OLMESARTAN MEDOXOMIL) 2 by mouth daily (or as directed)  #60 x 11   Entered by:   Orlan Leavens RMA   Authorized by:   Newt Lukes MD   Signed by:   Orlan Leavens RMA on 10/13/2009   Method used:   Faxed to ...       [facility] (retail)       [address] [place]       [address]       [place], [place]  [address]       Ph: [phone]       Fax: [phone]   RxID:   [id number] [facility] LANCETS  MISC (LANCETS) use as directed  #100 x 11   Entered by:   Orlan Leavens RMA   Authorized by:   Newt Lukes MD   Signed by:   Orlan Leavens RMA on 10/13/2009   Method used:   Faxed to ...       [facility] (retail)       [address] [place]       [address]       [place], [place]  [address]       Ph: [phone]       Fax: [phone]   RxID:   [id number] [facility] TEST  STRP (GLUCOSE BLOOD) check two times a day  #100 x 11   Entered by:   Orlan Leavens RMA   Authorized by:   Newt Lukes MD   Signed by:   Orlan Leavens RMA on 10/13/2009   Method used:   Faxed to ...       [facility] (retail)       [address] [place]       [address]       [place], [place]  [address]       Ph: [phone]       Fax: [phone]   RxID:   [id number]

## 2010-03-16 NOTE — Letter (Signed)
Summary: Lipid Letter  [facility]  [address] [place], [place] [address]   Phone: [phone]  Fax: [phone]    10/14/2009  [name] [address] [place], [place]  [address]  Dear Ms. Jasperson:  We have carefully reviewed your last lipid profile from 10/03/09 and the results are noted below with a summary of recommendations for lipid management.    Cholesterol:       271       HDL "good" Cholesterol:   40.10       LDL "bad" Cholesterol:   113       Triglycerides:       207.0      Cholestrol- total and LDL is creeping up as you expected. Diabetes still ok. Need to take pravastatin as we discuss at your office visit. No meds for Diabetes, but work on diet control. All other labs was normal. Attached copy of labwork for your records.        Current Medications: 1)    Afrin Nasal Spray 0.05 % Soln (Oxymetazoline hcl) .... Use once daily 2)    Finacea 15 % Gel (Azelaic acid) .... Apply once daily 3)    Clobetasol Propionate 0.05 % Soln (Clobetasol propionate) .... Use two times a day 4)    Capex 0.01 % Sham (Fluocinolone acetonide) .... Use two times a week 5)    Halobetasol Propionate 0.05 % Crea (Halobetasol propionate) .... Apply 3-4 times a week 6)    Nasonex 50 Mcg/act Susp (Mometasone furoate) .... Use 2-3 times q week 7)    Onetouch Test  Strp (Glucose blood) .... Check two times a day 8)    Onetouch Lancets  Misc (Lancets) .... Use as directed 9)    Prilosec 20 Mg Cpdr (Omeprazole) .... Take 1 by mouth qd 10)    Benicar 20 Mg Tabs (Olmesartan medoxomil) .... 2 by mouth daily (or as directed) 11)    Pravastatin Sodium 20 Mg Tabs (Pravastatin sodium) .Marland Kitchen.. 1 by mouth once daily 12)    Aspirin 81 Mg Tabs (Aspirin) .... Take 1 by mouth once daily 13)    Multivitamins  Tabs (Multiple vitamin) .... Take 1 by mouth once daily  If you have any questions, please call. We appreciate being able to work with you.   Sincerely,    [facility] Newt Lukes MD

## 2010-03-16 NOTE — Assessment & Plan Note (Signed)
Summary: staple removal from back of head, post hosp [facility]/cd   Vital Signs:  Patient profile:   65 year old female Height:      62 inches (157.48 cm) Weight:      137.12 pounds (62.33 kg) BMI:     25.17 O2 Sat:      97 % on Room air Temp:     98.5 degrees F (36.94 degrees C) oral Pulse rate:   78 / minute BP sitting:   162 / 110  (left arm) Cuff size:   regular  Vitals Entered By: Orlan Leavens (March 09, 2009 10:39 AM)  O2 Flow:  Room air CC: F/u from ER/ staples need to be removed/ Pt states she had stop taking all meds due to muscle/joint pain. Just started back  taking benicar  Is Patient Diabetic? Yes Did you bring your meter with you today? No Pain Assessment Patient in pain? no        Primary Care Provider:  Newt Lukes MD  CC:  F/u from ER/ staples need to be removed/ Pt states she had stop taking all meds due to muscle/joint pain. Just started back  taking benicar .  History of Present Illness: scalp lac - fell 1/18 - accidental slip on ice at home fell backwards - hit head and got laceration - seen in ed for same - dictation reviewed no abn on CT head - staples x 6 - needs removal of same - no headache - no vision change no bleeding or fever  HTN - concerned her BP has been running higher than usual - stopped all her meds mid Nov due to muscle and bone fatigue - since head trauma, noted BP running higher than usual so resumed benicar last week but feels it is not working - no CP, vision changes or HA - mild fatigue and muscle c/o persist even off medications ?if needs higher dose  dyslipidemia - feels omega 3 increases her bleeding risk -  increase in telectasias (face, arms) while on this medication and esp while on ibuprofen did not feel different when off pravastatin (re: muscle fatigue) so ?if should resume this med -  Clinical Review Panels:  Immunizations   Last Tetanus Booster:  Td (02/15/2004)   Last Pneumovax:  given  (02/14/2002)  Lipid Management   Cholesterol:  215 (10/06/2008)   LDL (bad choesterol):  113 (06/03/2008)   HDL (good cholesterol):  54.00 (10/06/2008)   Current Medications (verified): 1)  Coq10 200 Mg Caps (Coenzyme Q10) .... Take 1 By Mouth Qd 2)  Afrin Nasal Spray 0.05 % Soln (Oxymetazoline Hcl) .... Use Once Daily 3)  Finacea 15 % Gel (Azelaic Acid) .... Apply Once Daily 4)  Clobetasol Propionate 0.05 % Soln (Clobetasol Propionate) .... Use Two Times A Day 5)  Capex 0.01 % Sham (Fluocinolone Acetonide) .... Use Two Times A Week 6)  Benicar 20 Mg Tabs (Olmesartan Medoxomil) .Marland Kitchen.. 1 By Mouth Daily 7)  Halobetasol Propionate 0.05 % Crea (Halobetasol Propionate) .... Apply 3-4 Times A Week 8)  Nasonex 50 Mcg/act Susp (Mometasone Furoate) .... Use 2-3 Times Q Week 9)  Onetouch Test  Strp (Glucose Blood) .... Check Two Times A Day 10)  Onetouch Lancets  Misc (Lancets) .... Use As Directed 11)  Prilosec 20 Mg Cpdr (Omeprazole) .... Take 1 By Mouth Qd  Allergies (verified): 1)  ! * Biaxin 2)  ! * E-Mycins 3)  ! Tetracycline 4)  ! Codeine  Past History:  Past Medical History: Last updated: 06/03/2008 Allergic rhinitis Anemia-NOS Asthma Colonic polyps, hx of Diabetes mellitus, type II GERD Hyperlipidemia Hypertension Rectal fissue  Review of Systems  The patient denies anorexia, fever, weight gain, chest pain, syncope, and headaches.    Physical Exam  General:  alert, well-developed, well-nourished, and cooperative to examination.  slightly overweight-appearing.   Lungs:  normal respiratory effort, no intercostal retractions or use of accessory muscles; normal breath sounds bilaterally - no crackles and no wheezes.    Heart:  normal rate, regular rhythm, no murmur, and no rub. BLE without edema. normal DP pulses and normal cap refill in all 4 extremities    Neurologic:  alert & oriented X3 and cranial nerves II-XII symetrically intact.  strength normal in all extremities,  sensation intact to light touch, and gait normal. speech fluent without dysarthria or aphasia  follows commands with good comprehension.  Skin:  well approximated and closed 3 cm lac on posterior scalp - 6 staples removed without complication Psych:  Oriented X3, memory intact for recent and remote, normally interactive, good eye contact, not anxious appearing, not depressed appearing, and not agitated.      Impression & Recommendations:  Problem # 1:  LACERATION, SCALP (ICD-873.0) staples removed from injury 1/18 - good progress w/o signs infx Time spent with patient 25 minutes, more than 50% of this time was spent counseling patient on med effects of HTN and chol treatmetns and removal of staples from scalp lac  Problem # 2:  HYPERTENSION (ICD-401.9)  will increase dose benicar -  consider change in meds if this not effective - pt to monitor her BP at home and let us know in next 7 days- if good control with change, will send over new rx for 40mg  dose would like to remain on ARB due to tolerability and co-exisitng DM Her updated medication list for this problem includes:    Benicar 20 Mg Tabs (Olmesartan medoxomil) .Marland Kitchen... 2 by mouth daily (or as directed)  BP today: 162/110 Prior BP: 132/94 (10/06/2008)  Labs Reviewed: K+: 3.9 (10/06/2008) Creat: : 0.8 (10/06/2008)   Chol: 215 (10/06/2008)   HDL: 54.00 (10/06/2008)   LDL: 113 (06/03/2008)   TG: 141.0 (10/06/2008)  Problem # 3:  HYPERLIPIDEMIA (ICD-272.4) as Mskel symptoms not improved off statin, i rec resuming the statin now esp with other med risk factors for ASD pt agrees but will remain off omega 3 The following medications were removed from the medication list:    Pravastatin Sodium 20 Mg Tabs (Pravastatin sodium) .Marland Kitchen... Take 1 by mouth qd Her updated medication list for this problem includes:    Pravastatin Sodium 20 Mg Tabs (Pravastatin sodium) .Marland Kitchen... 1 by mouth once daily  Labs Reviewed: SGOT: 23 (10/06/2008)   SGPT: 28  (10/06/2008)   HDL:54.00 (10/06/2008), 63.10 (06/03/2008)  LDL:113 (06/03/2008)  Chol:215 (10/06/2008), 197 (06/03/2008)  Trig:141.0 (10/06/2008), 106.0 (06/03/2008)  Complete Medication List: 1)  Coq10 200 Mg Caps (Coenzyme q10) .... Take 1 by mouth qd 2)  Afrin Nasal Spray 0.05 % Soln (Oxymetazoline hcl) .... Use once daily 3)  Finacea 15 % Gel (Azelaic acid) .... Apply once daily 4)  Clobetasol Propionate 0.05 % Soln (Clobetasol propionate) .... Use two times a day 5)  Capex 0.01 % Sham (Fluocinolone acetonide) .... Use two times a week 6)  Halobetasol Propionate 0.05 % Crea (Halobetasol propionate) .... Apply 3-4 times a week 7)  Nasonex 50 Mcg/act Susp (Mometasone furoate) .... Use 2-3 times q week 8)  Onetouch Test Strp (Glucose blood) .... Check two times a day 9)  Onetouch Lancets Misc (Lancets) .... Use as directed 10)  Prilosec 20 Mg Cpdr (Omeprazole) .... Take 1 by mouth qd 11)  Benicar 20 Mg Tabs (Olmesartan medoxomil) .... 2 by mouth daily (or as directed) 12)  Pravastatin Sodium 20 Mg Tabs (Pravastatin sodium) .Marland Kitchen.. 1 by mouth once daily  Patient Instructions: 1)  staples removed without problems today - ok to gently wash affected skin when you are washing your hair as usual - no need for any other ointment to area 2)  increase Benicar as discussed - if this has improved your blood pressure, call us and we will send prescription for 40mg  tabs - if this has not improved your blood pressure, we will change to a new medication 3)  resume the pravastain as discussed but ok to stop fish oil 4)  Please schedule a follow-up appointment as previously scheduled, sooner if problems.

## 2010-03-16 NOTE — Assessment & Plan Note (Signed)
Summary: NEW PT ---[employer]----$50---PKG---STC   Vital Signs:  Patient profile:   65 year old female Height:      62 inches (157.48 cm) Weight:      133.0 pounds (60.45 kg) BMI:     24.41 O2 Sat:      99 % Temp:     98.4 degrees F (36.89 degrees C) oral Pulse rate:   81 / minute BP sitting:   132 / 98  (left arm) Cuff size:   regular  Vitals Entered By: Orlan Leavens (June 03, 2008 10:19 AM) CC: New patient est. Is Patient Diabetic? Yes  Pain Assessment Patient in pain? no        Primary Care Provider:  Newt Lukes MD  CC:  New patient est..  History of Present Illness: 65 year old patient here today to establish care. Previously followed by Dr. Smith Mince who is no longer in practice.  patient has history of diabetes, which has been diet controlled. She has never been on medications for control of her sugars and her A1c has generally run 6.0 or less. She was due for a recheck of her A1c in December 2009 but not done due to no primary care physician. she is concerned that this may be poorly controlled at this time due to an approximate 10 pound weight gain since last fall.   Patient also with history of hypertension. 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 she has tried HCTZ in the past, but when diagnosed with diabetes, discontinued this medication. She has also tried samples of Benicar 20 mg daily with good results but when her blood pressure became controlled with weight loss, she stopped the medication.  She also has history of high cholesterol, for which she takes statin medication. She has tolerated pravastatin, without complications of muscle weakness or pain. she also takes fish oil supplements for control of her cholesterol.  she describes history of microscopic hematuria with positive blood on urinalysis for "years. She relates this as a positive family treat as both her mother and her daughter had the same diagnosis.  She has had no dysuria. No gross hematuria. No change in frequency.  Lastly, she describes concerns that the entire left hand will occasionally go none. She is not localize this to any particular fingers or section of her hands, but describes the entirety of her hand. The numbness sensation will creep upwards towards her elbow and occasionally involve her entire arm. The symptoms are worse upon waking in the morning and also while driving. She has found she can relieve the numbness symptoms simply with change in position of her hand. she has no previous diagnosis of known carpal tunnel or other neuropathy related to her diabetes.  Preventive Screening-Counseling & Management     Alcohol drinks/day: 0     Smoking Status: quit > 6 months     Year Quit: 1976     Caffeine use/day: 1     Caffeine Counseling: not indicated; caffeine use is not excessive or problematic     Does Patient Exercise: yes     Type of exercise: walk     Exercise (avg: min/session): 30-60     Times/week: 5     Exercise Counseling: not indicated; exercise is adequate  Current Medications (verified): 1)  Pravastatin Sodium 20 Mg Tabs (Pravastatin Sodium) .... Take 1 By Mouth Qd 2)  Prilosec 20 Mg Cpdr (Omeprazole) .... Take 1 By Mouth Qd 3)  Vitamin  D3 1000 Unit Tabs (Cholecalciferol) .... Take 1 By Mouth Qd 4)  Hawthorne Berry 500 Mg Caps (Hawthorn) .... Take 2 By Mouth Qd 5)  Coq10 200 Mg Caps (Coenzyme Q10) .... Take 1 By Mouth Qd 6)  Oscal 500/200 D-3 500-200 Mg-Unit Tabs (Calcium-Vitamin D) .... Take 1 By Mouth Qd 7)  Afrin Nasal Spray 0.05 % Soln (Oxymetazoline Hcl) .... Use Once Daily 8)  Finacea 15 % Gel (Azelaic Acid) .... Apply Once Daily 9)  Clobetasol Propionate 0.05 % Crea (Clobetasol Propionate) .... Apply 3-5 Times A Week 10)  Clobetasol Propionate 0.05 % Soln (Clobetasol Propionate) .... Use Two Times A Day 11)  Capex 0.01 % Sham (Fluocinolone Acetonide) .... Use Two Times A Week  Allergies  (verified): 1)  ! * Biaxin 2)  ! * E-Mycins 3)  ! Tetracycline 4)  ! Codeine  Past History:  Past Medical History:    Allergic rhinitis    Anemia-NOS    Asthma    Colonic polyps, hx of    Diabetes mellitus, type II    GERD    Hyperlipidemia    Hypertension    Rectal fissue  Past Surgical History:    Caesarean section    Breast biopsy (1988)    Benign rectal growth in 2004 removed by Dr.Rosenbower  Family History:    Family History of Arthritis (parents)    Family History of Asthma (parent & other blood relative)    Family History Breast cancer 1st degree relative <50 (other blood relative)    Family History Hypertension (parent & other blood relative)    Family History Lung cancer (father)    Family History of Sudden Death (father died at age 38 due to lung cancer)    Heart Disease (parent & other blood relative)  Social History:    lives alone    Remote smoker, none since 89.    No alcohol.    Retired Child psychotherapist    Smoking Status:  quit > 6 months    Does Patient Exercise:  yes    Caffeine use/day:  1  Review of Systems       see HPI above. I have reviewed all other systems and they were negative.   Physical Exam  General:  alert, well-developed, well-nourished, and cooperative to examination.  slightly overweight-appearing.   Head:  Normocephalic and atraumatic without obvious abnormalities. No apparent alopecia or balding. Eyes:  vision grossly intact; pupils equal, round and reactive to light.  conjunctiva and lids normal.   ears corrective lenses Nose:  no external deformity, no external erythema, and no nasal discharge.   Mouth:  Oral mucosa and oropharynx without lesions or exudates.  Teeth in good repair. Neck:  supple, full ROM, no masses, no thyromegaly; no thyroid nodules or tenderness. no JVD or carotid bruits.   Chest Wall:  No deformities, masses, or tenderness noted. Lungs:  normal respiratory effort, no intercostal retractions or use of  accessory muscles; normal breath sounds bilaterally - no crackles and no wheezes.    Heart:  normal rate, regular rhythm, no murmur, and no rub. BLE without edema. normal DP pulses and normal cap refill in all 4 extremities    Abdomen:  soft, non-tender, normal bowel sounds, no distention; no masses and no appreciable hepatomegaly or splenomegaly.   Pulses:  R radial normal, R carotid normal, L radial normal, and L carotid normal.   Extremities:  No clubbing, cyanosis, edema, or deformity noted with normal full range of  motion of all joints.   Neurologic:  alert & oriented X3 and cranial nerves II-XII symetrically intact.  strength normal in all extremities, sensation intact to light touch, and gait normal. speech fluent without dysarthria or aphasia  follows commands with good comprehension.  Skin:  Intact without suspicious lesions or rashes Cervical Nodes:  No lymphadenopathy noted Axillary Nodes:  No palpable lymphadenopathy Psych:  Oriented X3, memory intact for recent and remote, normally interactive, good eye contact, not anxious appearing, not depressed appearing, and not agitated.      Impression & Recommendations:  Problem # 1:  DIABETES MELLITUS, TYPE II (ICD-250.00) diet controlled -A1c has been 6.0 or less.  However, with recent weight gain, concerned for lack of control. we'll check A1c today and encourage diet changes with weight loss as discussed. We'll arrange for eye exam and performed foot exam next visit for August -Physical. Orders: TLB-A1C / Hgb A1C (Glycohemoglobin) (83036-A1C)  Her updated medication list for this problem includes:    Benicar 20 Mg Tabs (Olmesartan medoxomil) .Marland Kitchen... 1 by mouth daily  Problem # 2:  HYPERTENSION (ICD-401.9) elev BP assoc with wt gain - also with white coat hypertensive syndrome by patient report. resume previously prescribed Benicar at this time patient to continue monitoring pressures at home and notify us of results. Her updated  medication list for this problem includes:    Benicar 20 Mg Tabs (Olmesartan medoxomil) .Marland Kitchen... 1 by mouth daily  BP today: 132/98  Orders: Prescription Created Electronically [phone])  Problem # 3:  OSTEOPENIA (ICD-733.90) diagnosed by previous bone scans per patient report check vitamin D level today given history of low level.  cont vit d supplement as ongoing with recommended calcium 1000 mg daily  Orders: T-Vitamin D (25-Hydroxy) ([id number])  Problem # 4:  HYPERLIPIDEMIA (ICD-272.4) has been on statin medications for over one year. Currently tolerating pravastatin well. Check lipids today as fasting, along with hepatic function for adverse side effects.  Also, continue current treatment with fish oil as ongoing pending results of test today. Her updated medication list for this problem includes:    Pravastatin Sodium 20 Mg Tabs (Pravastatin sodium) .Marland Kitchen... Take 1 by mouth qd  Orders: TLB-Lipid Panel (80061-LIPID) TLB-Hepatic/Liver Function Pnl (80076-HEPATIC)  Problem # 5:  CARPAL TUNNEL SYNDROME (ICD-354.0) history of burning, pain in hands, left greater than right, especially upon waking in morning and while driving.  Symptoms relieved with positional changes. Symptoms consistent with carpal tunnel by history and exam. unable to tolerate diuretics or B6 due to adverse side effects. also intolerant of steroid injections due to diabetes. discussed benefit of weight loss, and use of wrist splint at night. If persisting symptoms, we'll consider referral for nerve conduction studies to rule out other diabetic neuropathy versus carpal tunnel.  Problem # 6:  GERD (ICD-530.81)  Her updated medication list for this problem includes:    Prilosec 20 Mg Cpdr (Omeprazole) .Marland Kitchen... Take 1 by mouth qd  Complete Medication List: 1)  Pravastatin Sodium 20 Mg Tabs (Pravastatin sodium) .... Take 1 by mouth qd 2)  Prilosec 20 Mg Cpdr (Omeprazole) .... Take 1 by mouth qd 3)  Vitamin D3 1000  Unit Tabs (Cholecalciferol) .... Take 1 by mouth qd 4)  Hawthorne Berry 500 Mg Caps (Hawthorn) .... Take 2 by mouth qd 5)  Coq10 200 Mg Caps (Coenzyme q10) .... Take 1 by mouth qd 6)  Oscal 500/200 D-3 500-200 Mg-unit Tabs (Calcium-vitamin d) .... Take 1 by mouth qd 7)  Afrin Nasal  Spray 0.05 % Soln (Oxymetazoline hcl) .... Use once daily 8)  Finacea 15 % Gel (Azelaic acid) .... Apply once daily 9)  Clobetasol Propionate 0.05 % Crea (Clobetasol propionate) .... Apply 3-5 times a week 10)  Clobetasol Propionate 0.05 % Soln (Clobetasol propionate) .... Use two times a day 11)  Capex 0.01 % Sham (Fluocinolone acetonide) .... Use two times a week 12)  Benicar 20 Mg Tabs (Olmesartan medoxomil) .Marland Kitchen.. 1 by mouth daily  Patient Instructions: 1)  we'll check A1c, cholesterol panel, hepatic function, and vitamin D today. 2)  Will represcribed Benicar 20 mg once daily for elevated pressure and watch pressures with weight loss effort. 3)  Revisit in August 2010, sooner as needed, for evaluation of physical, labs and recheck of weight and pressure. 4)  for now continue current medications pending results of lab test. 5)  Please schedule a follow-up appointment in 4 months. 6)  You need to lose weight. Consider a lower calorie diet and regular exercise.  Prescriptions: BENICAR 20 MG TABS (OLMESARTAN MEDOXOMIL) 1 by mouth daily  #30 x 2   Entered by:   Orlan Leavens   Authorized by:   Newt Lukes MD   Signed by:   Orlan Leavens on 06/03/2008   Method used:   Faxed to ...       [facility] (retail)       [address] [place]       [address]       [place], [place]  [address]       Ph: [phone]       Fax: [phone]   RxID:   [id number] BENICAR 20 MG TABS (OLMESARTAN MEDOXOMIL) 1 by mouth daily  #30 x 2   Entered and Authorized by:   Newt Lukes MD   Signed by:   Newt Lukes MD on 06/03/2008   Method used:   Print then Give to Patient   RxID:    [id number]       Preventive Care Screening  Mammogram:    Date:  11/15/2007    Results:  normal   Colonoscopy:    Date:  11/15/2006    Results:  Polyp   Pap Smear:    Date:  06/15/2006    Results:  normal   Last Tetanus Booster:    Date:  02/15/2004    Results:  Td   Last Pneumovax:    Date:  02/14/2002    Results:  given

## 2010-03-16 NOTE — Letter (Signed)
Summary: [facility]abetic [facility] Exam/[facility]  [facility]   Imported By: Lester [place] 10/21/2008 11:00:48  _____________________________________________________________________  External Attachment:    Type:   Image     Comment:   External Document

## 2010-03-16 NOTE — Letter (Signed)
Summary: [facility]  [facility]   Imported By: Lester [place] 10/21/2008 10:55:36  _____________________________________________________________________  External Attachment:    Type:   Image     Comment:   External Document

## 2010-07-02 NOTE — Op Note (Signed)
NAME:  [name], [name]                     ACCOUNT NO.:  [id number]   MEDICAL RECORD NO.:  [id number]                   PATIENT TYPE:  AMB   LOCATION:  ENDO                                 FACILITY:  [facility]   PHYSICIAN:  Anselmo Rod, M.D.               DATE OF BIRTH:  1945/04/14   DATE OF PROCEDURE:  07/19/2002  DATE OF DISCHARGE:                                 OPERATIVE REPORT   PROCEDURE PERFORMED:  Colonoscopy.   ENDOSCOPIST:  Anselmo Rod, M.D.   INSTRUMENT USED:  Olympus video colonoscope.   INDICATIONS FOR PROCEDURE:  A 65 year old white female with a history of  rectal bleeding.  The patient denies a family history of colon cancer.  There is a history of breast cancer in a maternal aunt.  Rule out colonic  polyps, masses, etc.   PREPROCEDURE PREPARATION:  Informed consent was procured from the patient.  The patient was fasted for eight hours prior to the procedure after being  prepped with a bottle of magnesium citrate and a gallon of GoLYTELY the  night prior to the procedure.   PREPROCEDURE PHYSICAL EXAMINATION:  VITAL SIGNS:  The patient with stable  vital signs.  NECK:  Supple.  CHEST:  Clear to auscultation.  CARDIAC:  S1, S2 regular.  ABDOMEN:  Soft with normal bowel sounds.   DESCRIPTION OF PROCEDURE:  The patient was placed in left lateral decubitus  position, sedated with 50 mg of Demerol and 5 mg of Versed intravenously.  Once the patient was adequately sedated and maintained on low-flow oxygen  and continuous cardiac monitoring, the Olympus video colonoscope was  advanced from the rectum to the cecum without difficulty.  The patient had  an excellent prep.  No masses, polyps, erosions, or ulcerations were seen.  There was no evidence of diverticulosis.  The appendiceal orifice and the  ileocecal valve were clearly visualized and photographed.  Small internal  and external hemorrhoids were seen on anal inspection.  Small internal  hemorrhoid  was seen on retroflexion of rectum along with prominent anal  papillae.  The patient tolerated the procedure well without complications.   IMPRESSION:  1. Small internal and external hemorrhoids.  2. Prominent anal papillae seen on retroflexion.  3. Otherwise, normal colonoscopy.   RECOMMENDATIONS:  1. Surgical evaluation for removal of anal papillae and possible treatment     of hemorrhoids to help with rectal bleeding.  2. Repeat colorectal cancer screening in the next five years unless the     patient develops anymore symptoms in the     interim.  3. High fiber diet with liberal fluid intake.  4. Outpatient follow-up within the next two weeks or earlier if need be.  Anselmo Rod, M.D.    JNM/MEDQ  D:  07/19/2002  T:  07/19/2002  Job:  [id number]   cc:   Sherry A. Rosalio Macadamia, M.D.  [address]  [address]  [place]  [place] [address]  Fax: [phone]   Adolph Pollack, M.D.  [address]  [place]  [place] [address]  Fax: [phone]

## 2010-07-02 NOTE — Op Note (Signed)
NAMEHAYVEN, CROY NO.:  [id number]   MEDICAL RECORD NO.:  [id number]          PATIENT TYPE:  AMB   LOCATION:  ENDO                         FACILITY:  [facility]   PHYSICIAN:  Anselmo Rod, M.D.  DATE OF BIRTH:  [birth year]   DATE OF PROCEDURE:  02/20/2004  DATE OF DISCHARGE:                                 OPERATIVE REPORT   PROCEDURE PERFORMED:  Flexible sigmoidoscopy.   ENDOSCOPIST:  Anselmo Rod, M.D.   INSTRUMENT USED:  Olympus video colonoscope.   INDICATIONS FOR PROCEDURE:  The patient is a 65 year old white female with a  history of an anal papilla removed in the past undergoing repeat flexible  sigmoidoscopy for nodularity felt at the anal verge initially detected by  Cordelia Pen A. Rosalio Macadamia, M.D.  Rule out hemorrhoids, masses, etc.   PREPROCEDURE PREPARATION:  Informed consent was procured from the patient.  The patient was fasted for eight hours prior to the procedure and prepped  with two Fleet enemas prior to procedure.   PREPROCEDURE PHYSICAL:  The patient had stable vital signs, neck supple,  chest clear to auscultation, S1 and S2 regular.  Abdomen soft with normal  bowel sounds.   DESCRIPTION OF PROCEDURE:  The patient was placed in the left lateral  decubitus position and sedated with 50 mg of Demerol and 5 mg of Versed in  slow incremental doses.  Once the patient was adequately sedated and  maintained on low-flow oxygen and continuous cardiac monitoring.  The  Olympus video colonoscope was advanced from the rectum to 20 cm. The scope  could not be advanced beyond this point as there was a large amount of  residual stool.  Retroflexion revealed internal hemorrhoids with some  redundant tissue around at the anal verge.  No definite mass was identified.  Rectal mucosa up to 20 cm appeared healthy.  The patient tolerated the  procedure well without complication.   IMPRESSION:  1.  Small internal hemorrhoids.  2.  No other masses or polyps  identified at the anal verge.  3.  Some redundant tissue around the hemorrhoids.   RECOMMENDATIONS:  1.  I will discuss these findings with Dr. Abbey Chatters and make further      recommendations for the patient.  2.  Anusol HC 2.5% suppositories may help with local inflammation.  3.  Outpatient followup in the next two weeks or earlier if need be.      Jyot   JNM/MEDQ  D:  02/21/2004  T:  02/21/2004  Job:  [id number]   cc:   Sherry A. Rosalio Macadamia, M.D.  [address]  [place]  [place] [address]  Fax: [phone]   Adolph Pollack, M.D.  [address], [address]  [place]  [place] [address]  Fax: [phone]

## 2010-08-04 ENCOUNTER — Other Ambulatory Visit: Payer: Self-pay | Admitting: Internal Medicine

## 2010-08-04 DIAGNOSIS — Z1231 Encounter for screening mammogram for malignant neoplasm of breast: Secondary | ICD-10-CM

## 2010-08-16 ENCOUNTER — Encounter: Payer: Self-pay | Admitting: Internal Medicine

## 2010-09-01 ENCOUNTER — Ambulatory Visit
Admission: RE | Admit: 2010-09-01 | Discharge: 2010-09-01 | Disposition: A | Discharge status: Home / Self Care | Payer: Medicare Other | Source: Ambulatory Visit | Attending: Internal Medicine | Admitting: Internal Medicine

## 2010-09-01 DIAGNOSIS — Z1231 Encounter for screening mammogram for malignant neoplasm of breast: Secondary | ICD-10-CM

## 2010-12-20 ENCOUNTER — Other Ambulatory Visit: Payer: Self-pay | Admitting: Internal Medicine

## 2010-12-20 ENCOUNTER — Other Ambulatory Visit (INDEPENDENT_AMBULATORY_CARE_PROVIDER_SITE_OTHER): Payer: Medicare Other

## 2010-12-20 ENCOUNTER — Ambulatory Visit (INDEPENDENT_AMBULATORY_CARE_PROVIDER_SITE_OTHER): Payer: Medicare Other | Admitting: Internal Medicine

## 2010-12-20 ENCOUNTER — Encounter: Payer: Self-pay | Admitting: Internal Medicine

## 2010-12-20 VITALS — BP 146/102 | HR 83 | Temp 97.9°F | Ht 62.0 in | Wt 137.0 lb

## 2010-12-20 DIAGNOSIS — E785 Hyperlipidemia, unspecified: Secondary | ICD-10-CM

## 2010-12-20 DIAGNOSIS — Z0000 Encounter for general adult medical examination without abnormal findings: Secondary | ICD-10-CM

## 2010-12-20 DIAGNOSIS — I10 Essential (primary) hypertension: Secondary | ICD-10-CM

## 2010-12-20 DIAGNOSIS — E119 Type 2 diabetes mellitus without complications: Secondary | ICD-10-CM

## 2010-12-20 DIAGNOSIS — Z136 Encounter for screening for cardiovascular disorders: Secondary | ICD-10-CM

## 2010-12-20 DIAGNOSIS — R51 Headache: Secondary | ICD-10-CM

## 2010-12-20 DIAGNOSIS — Z13820 Encounter for screening for osteoporosis: Secondary | ICD-10-CM

## 2010-12-20 LAB — HEMOGLOBIN A1C: Hgb A1c MFr Bld: 6.2 % (ref 4.6–6.5)

## 2010-12-20 LAB — LDL CHOLESTEROL, DIRECT: Direct LDL: 180.7 mg/dL

## 2010-12-20 LAB — CREATININE, SERUM: Creatinine, Ser: 0.8 mg/dL (ref 0.4–1.2)

## 2010-12-20 LAB — LIPID PANEL
Cholesterol: 265 mg/dL — ABNORMAL HIGH (ref 0–200)
HDL: 56 mg/dL (ref >39.00)
Total CHOL/HDL Ratio: 5
Triglycerides: 173 mg/dL — ABNORMAL HIGH (ref 0.0–149.0)
VLDL: 34.6 mg/dL (ref 0.0–40.0)

## 2010-12-20 MED ORDER — ONETOUCH LANCETS MISC: Status: DC

## 2010-12-20 MED ORDER — GLUCOSE BLOOD VI STRP: ORAL_STRIP | Status: DC

## 2010-12-20 MED ORDER — OLMESARTAN MEDOXOMIL 40 MG PO TABS: 40.0000 mg | ORAL_TABLET | Freq: Every day | ORAL | Status: DC

## 2010-12-20 MED ORDER — PRAVASTATIN SODIUM 20 MG PO TABS: 20.0000 mg | ORAL_TABLET | Freq: Every day | ORAL | Status: DC

## 2010-12-20 NOTE — Assessment & Plan Note (Signed)
Uncontrolled today, variable compliance with prescribed therapy Renewal on ARB prescription today,  encourage followup to review adequate control of blood pressure and titration of meds as needed BP Readings from Last 3 Encounters:  12/20/10 146/102  10/13/09 122/84  10/13/09 122/84

## 2010-12-20 NOTE — Assessment & Plan Note (Signed)
Declines omega-3 - variable compliance with statin therapy as prescribed Prescribed Pravachol 2010, not taking Since summer 2012 present  Recheck lipid panel, represcribed statin therapy today

## 2010-12-20 NOTE — Progress Notes (Addendum)
Subjective:    Patient ID: [name], female    DOB: 1945-12-29, 65 y.o.   MRN: [id number]  HPI   Here for wellness visit today: - feels generally well:  Diet: Heart Healthy and carb modified as diabetic Physical Activities: fit and active Depression/mood screen: Negative Cognition screen: Normal orientation, appropriate identification and recall Hearing:grosslly normal Visual Acuity: Grossly normal, wears reading glasses, sees optho yearly   ADL's: Capable   Fall Risk: None Home Safety: Good End-of-Life Planning: Advance directive - Full code/I agree   also reviewed chronic med issues:   HTN - concerned her BP has been running high due to stress (mom sick) - stopped all her meds mid Nov 2010 due to muscle and bone fatigue - mild head trauma 02/2009, resumed benicar- but off med again since summer 2012 no CP, or vision changes; mild occassional left side HA (relieved with ibuprofen)-  Persisting headache for approx 2 years - worse when lying down - ? MRI mild fatigue and myalgia persist wether on or off ARB medications   dyslipidemia -prescribed statin  - feels omega 3 increases her bleeding risk -   increase in telectasias (face, arms) while on this medication and esp while on ibuprofen did not feel different when off pravastatin (re: muscle fatigue) so resumed same last OV   DM2 - diet controlled -but less attention to current status and exercise in past 6 months due to illness of mother   Past Medical History  Diagnosis Date  . ALLERGIC RHINITIS   . ANEMIA-NOS   . ASTHMA   . Carpal tunnel syndrome   . COLONIC POLYPS, HX OF   . DIABETES MELLITUS, TYPE II     diet controlled  . GERD   . HYPERLIPIDEMIA   . HYPERTENSION   . OSTEOPENIA   . Rectal fissure    Family History  Problem Relation Age of Onset  . Lung cancer Father   . Arthritis Other     Parents  . Asthma Other     parent, other relative  . Breast cancer Other     other relative  .  Hypertension Other     parent, other relative  . Heart disease Other     parent, other relative   History  Substance Use Topics  . Smoking status: Former Smoker    Quit date: 02/14/1974  . Smokeless tobacco: Not on file  . Alcohol Use: No     Review of Systems Constitutional: Negative for fever or weight change.  Respiratory: Negative for cough and shortness of breath.   Cardiovascular: Negative for chest pain or palpitations.  Gastrointestinal: Negative for abdominal pain, no bowel changes.  Musculoskeletal: Negative for gait problem or joint swelling.  Skin: Negative for rash.  Neurological: Negative for dizziness or dysarthria/weakness. chronic L occipital headache  No other specific complaints in a complete review of systems (except as listed in HPI above).     Objective:   Physical Exam BP 146/102  Pulse 83  Temp(Src) 97.9 F (36.6 C) (Oral)  Ht 5\' 2"  (1.575 m)  Wt 137 lb (62.143 kg)  BMI 25.06 kg/m2  SpO2 99% Wt Readings from Last 3 Encounters:  12/20/10 137 lb (62.143 kg)  10/13/09 137 lb (62.143 kg)  03/09/09 137 lb 1.9 oz (62.197 kg)   Constitutional: She appears well-developed and well-nourished. No distress.  HENT: Head: Normocephalic and atraumatic. Ears: B TMs ok, no erythema or effusion; Nose: Nose normal.  Mouth/Throat: Oropharynx is clear  and moist. No oropharyngeal exudate.  Eyes: Conjunctivae and EOM are normal. Pupils are equal, round, and reactive to light. No scleral icterus.  Neck: Normal range of motion. Neck supple. No JVD present. No thyromegaly present.  Cardiovascular: Normal rate, regular rhythm and normal heart sounds.  No murmur heard. No BLE edema. Pulmonary/Chest: Effort normal and breath sounds normal. No respiratory distress. She has no wheezes.  Abdominal: Soft. Bowel sounds are normal. She exhibits no distension. There is no tenderness. no masses Musculoskeletal: Normal range of motion, no joint effusions. No gross  deformities Neurological: She is alert and oriented to person, place, and time. No cranial nerve deficit. Coordination, speech, balance and cognition normal.  Skin: Skin is warm and dry. No rash noted. No erythema.  Psychiatric: She has a normal mood and affect. Her behavior is normal. Judgment and thought content normal.   Lab Results  Component Value Date   WBC 5.6 10/13/2009   HGB 13.2 10/13/2009   HCT 37.9 10/13/2009   PLT 261.0 10/13/2009   GLUCOSE 102* 10/13/2009   CHOL 271* 10/13/2009   TRIG 207.0* 10/13/2009   HDL 45.80 10/13/2009   LDLDIRECT 172.0 10/13/2009   LDLCALC 113* 06/03/2008   ALT 38* 10/13/2009   AST 30 10/13/2009   NA 142 10/13/2009   K 4.6 10/13/2009   CL 105 10/13/2009   CREATININE 0.8 10/13/2009   BUN 21 10/13/2009   CO2 26 10/13/2009   TSH 2.42 10/13/2009   HGBA1C 6.2 10/13/2009    EKG: sinus rhythm - no arrhythmia or ischemic change     Assessment & Plan:  AWV - v70.0 - Today patient counseled on age appropriate routine health concerns for screening and prevention, each reviewed and up to date or declined. Immunizations reviewed and up to date or declined. Labs/ECG reviewed. Risk factors for depression reviewed and negative. Hearing function and visual acuity are intact. ADLs screened and addressed as needed. Functional ability and level of safety reviewed and appropriate. Education, counseling and referrals performed based on assessed risks today. Patient provided with a copy of personalized plan for preventive services.  headache - chronic > 2 years - located left occipital region. Symptoms worse with supine position. Symptoms relieved with ibuprofen; neuro exam today benign.  the patient would like imaging to review the anatomy due to history of mild head trauma (precipitating event 02/2009)

## 2010-12-20 NOTE — Assessment & Plan Note (Signed)
Lab Results  Component Value Date   HGBA1C 6.2 10/13/2009   Diet controlled by history - check A1c today to monitor same Wt Readings from Last 3 Encounters:  12/20/10 137 lb (62.143 kg)  10/13/09 137 lb (62.143 kg)  03/09/09 137 lb 1.9 oz (62.197 kg)

## 2010-12-20 NOTE — Patient Instructions (Addendum)
It was good to see you today. We have reviewed your prior records including labs and tests today - Health Maintenance reviewed - glycohemoglobin ordered, other recommended immunizations are up to date. Medications reviewed, no changes at this time. Refill on medication(s) as discussed today.  we'll make referral for bone density at [facility] and MRI of brain for headache symptoms. Our office will contact you regarding appointment(s) once made. Test(s) ordered today. Your results will be called to you after review (48-72hours after test completion). If any changes need to be made, you will be notified at that time. Please schedule followup in 3-4 months for check on diabetes mellitus, cholesterol and blood pressure, call sooner if problems.

## 2010-12-29 ENCOUNTER — Ambulatory Visit
Admission: RE | Admit: 2010-12-29 | Discharge: 2010-12-29 | Disposition: A | Discharge status: Home / Self Care | Payer: Medicare Other | Source: Ambulatory Visit | Attending: Internal Medicine | Admitting: Internal Medicine

## 2010-12-29 DIAGNOSIS — R51 Headache: Secondary | ICD-10-CM

## 2011-03-21 ENCOUNTER — Ambulatory Visit: Payer: Medicare Other | Admitting: Internal Medicine

## 2011-04-18 ENCOUNTER — Encounter: Payer: Self-pay | Admitting: Internal Medicine

## 2011-04-18 ENCOUNTER — Other Ambulatory Visit (INDEPENDENT_AMBULATORY_CARE_PROVIDER_SITE_OTHER): Payer: Medicare Other

## 2011-04-18 ENCOUNTER — Ambulatory Visit (INDEPENDENT_AMBULATORY_CARE_PROVIDER_SITE_OTHER): Payer: Medicare Other | Admitting: Internal Medicine

## 2011-04-18 DIAGNOSIS — R5383 Other fatigue: Secondary | ICD-10-CM

## 2011-04-18 DIAGNOSIS — R5381 Other malaise: Secondary | ICD-10-CM

## 2011-04-18 DIAGNOSIS — E785 Hyperlipidemia, unspecified: Secondary | ICD-10-CM

## 2011-04-18 DIAGNOSIS — I10 Essential (primary) hypertension: Secondary | ICD-10-CM

## 2011-04-18 DIAGNOSIS — E119 Type 2 diabetes mellitus without complications: Secondary | ICD-10-CM

## 2011-04-18 DIAGNOSIS — J309 Allergic rhinitis, unspecified: Secondary | ICD-10-CM

## 2011-04-18 LAB — CBC WITH DIFFERENTIAL/PLATELET
Basophils Absolute: 0.1 10*3/uL (ref 0.0–0.1)
Basophils Relative: 1.1 % (ref 0.0–3.0)
Eosinophils Absolute: 0.2 10*3/uL (ref 0.0–0.7)
Eosinophils Relative: 2.7 % (ref 0.0–5.0)
HCT: 39.8 % (ref 36.0–46.0)
Hemoglobin: 13.3 g/dL (ref 12.0–15.0)
Lymphocytes Relative: 26.3 % (ref 12.0–46.0)
Lymphs Abs: 1.6 10*3/uL (ref 0.7–4.0)
MCHC: 33.4 g/dL (ref 30.0–36.0)
MCV: 94.3 fl (ref 78.0–100.0)
Monocytes Absolute: 0.4 10*3/uL (ref 0.1–1.0)
Monocytes Relative: 6.2 % (ref 3.0–12.0)
Neutro Abs: 4 10*3/uL (ref 1.4–7.7)
Neutrophils Relative %: 63.7 % (ref 43.0–77.0)
Platelets: 248 10*3/uL (ref 150.0–400.0)
RBC: 4.22 Mil/uL (ref 3.87–5.11)
RDW: 13.3 % (ref 11.5–14.6)
WBC: 6.3 10*3/uL (ref 4.5–10.5)

## 2011-04-18 LAB — TSH: TSH: 1.58 u[IU]/mL (ref 0.35–5.50)

## 2011-04-18 LAB — HEPATIC FUNCTION PANEL
ALT: 26 U/L (ref 0–35)
AST: 24 U/L (ref 0–37)
Albumin: 4.3 g/dL (ref 3.5–5.2)
Alkaline Phosphatase: 61 U/L (ref 39–117)
Bilirubin, Direct: 0.1 mg/dL (ref 0.0–0.3)
Total Bilirubin: 0.5 mg/dL (ref 0.3–1.2)
Total Protein: 7.2 g/dL (ref 6.0–8.3)

## 2011-04-18 LAB — HEMOGLOBIN A1C: Hgb A1c MFr Bld: 6.2 % (ref 4.6–6.5)

## 2011-04-18 LAB — LIPID PANEL
Cholesterol: 209 mg/dL — ABNORMAL HIGH (ref 0–200)
HDL: 56.8 mg/dL (ref >39.00)
Total CHOL/HDL Ratio: 4
Triglycerides: 104 mg/dL (ref 0.0–149.0)
VLDL: 20.8 mg/dL (ref 0.0–40.0)

## 2011-04-18 LAB — LDL CHOLESTEROL, DIRECT: Direct LDL: 132.3 mg/dL

## 2011-04-18 MED ORDER — OLMESARTAN MEDOXOMIL 20 MG PO TABS: 20.0000 mg | ORAL_TABLET | Freq: Every day | ORAL | Status: DC

## 2011-04-18 MED ORDER — MOMETASONE FUROATE 50 MCG/ACT NA SUSP: 2.0000 | Freq: Every day | NASAL | Status: DC

## 2011-04-18 NOTE — Assessment & Plan Note (Addendum)
Uncontrolled today, variable compliance with prescribed therapy Renewal on ARB prescription  12/2010 - but using less dose than rx'd Home BP readings reviewed - refill at lower dose  encourage followup to review adequate control of blood pressure and titration of meds as needed BP Readings from Last 3 Encounters:  04/18/11 130/92  12/20/10 146/102  10/13/09 122/84

## 2011-04-18 NOTE — Assessment & Plan Note (Signed)
Declines omega-3 - variable compliance with statin therapy as prescribed Prescribed Pravachol 2010, not taking Since summer 2012 present  Recheck lipid panel, represcribed statin therapy 12/2010 Also on better diet and exercise + omega 3 and Co Q 10

## 2011-04-18 NOTE — Assessment & Plan Note (Signed)
Lab Results  Component Value Date   HGBA1C 6.2 12/20/2010   Diet controlled by history - check A1c today to monitor same

## 2011-04-18 NOTE — Assessment & Plan Note (Signed)
Renew nasal steroid - The current medical regimen is effective;  continue present plan and medications.

## 2011-04-18 NOTE — Progress Notes (Signed)
  Subjective:    Patient ID: [name], female    DOB: [birth year], 66 y.o.   MRN: [id number]  HPI  Here for follow up - reviewed chronic medical issues:   HTN - concerned her BP has been running high due to stress (mom sick) - stopped all her meds mid Nov 2010 due to muscle and bone fatigue - mild head trauma 02/2009, resumed benicar- but off med again thru summer 2012 No chest pain, or vision changes; mild occassional left side HA (relieved with ibuprofen)-  Persisting headache for approx 2 years - worse when lying down - MRI 12/2010 unremarkable mild fatigue and myalgia persist wether on or off ARB medications   dyslipidemia -prescribed statin  - feels omega 3 increases her bleeding risk -   increase in telectasias (face, arms) while on this medication and esp while on ibuprofen did not feel different when off pravastatin (re: muscle fatigue) so resumed same last OV   DM2 - diet controlled -but less attention to current status and exercise in past 9 months due to illness of mother   Past Medical History  Diagnosis Date  . ALLERGIC RHINITIS   . ANEMIA-NOS   . ASTHMA   . Carpal tunnel syndrome   . COLONIC POLYPS, HX OF   . DIABETES MELLITUS, TYPE II     diet controlled  . GERD   . HYPERLIPIDEMIA   . HYPERTENSION   . OSTEOPENIA   . Rectal fissure      Review of Systems  Constitutional: Negative for fever or weight change. + fatigue Respiratory: Negative for cough and shortness of breath.   Cardiovascular: Negative for chest pain or palpitations.       Objective:   Physical Exam  BP 130/92  Pulse 76  Temp(Src) 97.9 F (36.6 C) (Oral)  Ht 5\' 2"  (1.575 m)  Wt 133 lb 6.4 oz (60.51 kg)  BMI 24.40 kg/m2  SpO2 98% Wt Readings from Last 3 Encounters:  04/18/11 133 lb 6.4 oz (60.51 kg)  12/20/10 137 lb (62.143 kg)  10/13/09 137 lb (62.143 kg)   Constitutional: She appears well-developed and well-nourished. No distress. Cardiovascular: Normal rate, regular  rhythm and normal heart sounds.  No murmur heard. No BLE edema. Pulmonary/Chest: Effort normal and breath sounds normal. No respiratory distress. She has no wheezes.  Psychiatric: She has a normal mood and affect. Her behavior is normal. Judgment and thought content normal.   Lab Results  Component Value Date   WBC 5.6 10/13/2009   HGB 13.2 10/13/2009   HCT 37.9 10/13/2009   PLT 261.0 10/13/2009   GLUCOSE 102* 10/13/2009   CHOL 265* 12/20/2010   TRIG 173.0* 12/20/2010   HDL 56.00 12/20/2010   LDLDIRECT 180.7 12/20/2010   LDLCALC 113* 06/03/2008   ALT 38* 10/13/2009   AST 30 10/13/2009   NA 142 10/13/2009   K 4.6 10/13/2009   CL 105 10/13/2009   CREATININE 0.8 12/20/2010   BUN 21 10/13/2009   CO2 26 10/13/2009   TSH 2.42 10/13/2009   HGBA1C 6.2 12/20/2010       Assessment & Plan:   See problem list. Medications and labs reviewed today.  Fatigue - nonspecific, check labs

## 2011-04-18 NOTE — Patient Instructions (Signed)
It was good to see you today. Medications reviewed, reduce Benicar to 20mg  daily, no other changes Refill on medication(s) as discussed today.  Test(s) ordered today. Your results will be called to you after review (48-72hours after test completion). If any changes need to be made, you will be notified at that time. Please schedule followup in 6 months for check on diabetes mellitus, cholesterol and blood pressure, call sooner if problems.

## 2011-09-01 ENCOUNTER — Other Ambulatory Visit: Payer: Self-pay | Admitting: Internal Medicine

## 2011-09-01 DIAGNOSIS — Z1231 Encounter for screening mammogram for malignant neoplasm of breast: Secondary | ICD-10-CM

## 2011-09-15 ENCOUNTER — Ambulatory Visit
Admission: RE | Admit: 2011-09-15 | Discharge: 2011-09-15 | Disposition: A | Discharge status: Home / Self Care | Payer: Medicare Other | Source: Ambulatory Visit | Attending: Internal Medicine | Admitting: Internal Medicine

## 2011-09-15 DIAGNOSIS — Z1231 Encounter for screening mammogram for malignant neoplasm of breast: Secondary | ICD-10-CM

## 2011-10-24 LAB — HM DIABETES EYE EXAM: HM Diabetic Eye Exam: NORMAL

## 2012-01-27 LAB — HEPATIC FUNCTION PANEL
ALT: 41 U/L — AB (ref 7–35)
AST: 27 U/L (ref 13–35)
Alkaline Phosphatase: 72 U/L (ref 25–125)
Bilirubin, Direct: 0.1 mg/dL (ref 0.01–0.4)

## 2012-01-27 LAB — CBC AND DIFFERENTIAL
Hemoglobin: 13.9 g/dL (ref 12.0–16.0)
Platelets: 304 10*3/uL (ref 150–399)
WBC: 8.2 10^3/mL

## 2012-02-20 ENCOUNTER — Other Ambulatory Visit (INDEPENDENT_AMBULATORY_CARE_PROVIDER_SITE_OTHER): Payer: 59

## 2012-02-20 ENCOUNTER — Encounter: Payer: Self-pay | Admitting: Internal Medicine

## 2012-02-20 ENCOUNTER — Ambulatory Visit (INDEPENDENT_AMBULATORY_CARE_PROVIDER_SITE_OTHER): Payer: 59 | Admitting: Internal Medicine

## 2012-02-20 VITALS — BP 142/98 | HR 85 | Temp 98.4°F | Ht 62.0 in | Wt 141.0 lb

## 2012-02-20 DIAGNOSIS — M899 Disorder of bone, unspecified: Secondary | ICD-10-CM

## 2012-02-20 DIAGNOSIS — L409 Psoriasis, unspecified: Secondary | ICD-10-CM

## 2012-02-20 DIAGNOSIS — Z79899 Other long term (current) drug therapy: Secondary | ICD-10-CM

## 2012-02-20 DIAGNOSIS — E785 Hyperlipidemia, unspecified: Secondary | ICD-10-CM

## 2012-02-20 DIAGNOSIS — Z136 Encounter for screening for cardiovascular disorders: Secondary | ICD-10-CM

## 2012-02-20 DIAGNOSIS — E119 Type 2 diabetes mellitus without complications: Secondary | ICD-10-CM

## 2012-02-20 DIAGNOSIS — L408 Other psoriasis: Secondary | ICD-10-CM

## 2012-02-20 DIAGNOSIS — M4120 Other idiopathic scoliosis, site unspecified: Secondary | ICD-10-CM

## 2012-02-20 DIAGNOSIS — M419 Scoliosis, unspecified: Secondary | ICD-10-CM | POA: Insufficient documentation

## 2012-02-20 DIAGNOSIS — M949 Disorder of cartilage, unspecified: Secondary | ICD-10-CM

## 2012-02-20 LAB — HEPATIC FUNCTION PANEL
ALT: 68 U/L — ABNORMAL HIGH (ref 0–35)
AST: 39 U/L — ABNORMAL HIGH (ref 0–37)
Albumin: 3.9 g/dL (ref 3.5–5.2)
Alkaline Phosphatase: 79 U/L (ref 39–117)
Bilirubin, Direct: 0 mg/dL (ref 0.0–0.3)
Total Bilirubin: 0.8 mg/dL (ref 0.3–1.2)
Total Protein: 7.6 g/dL (ref 6.0–8.3)

## 2012-02-20 LAB — LIPID PANEL
Cholesterol: 253 mg/dL — ABNORMAL HIGH (ref 0–200)
HDL: 50.2 mg/dL (ref >39.00)
Total CHOL/HDL Ratio: 5
Triglycerides: 200 mg/dL — ABNORMAL HIGH (ref 0.0–149.0)
VLDL: 40 mg/dL (ref 0.0–40.0)

## 2012-02-20 LAB — BASIC METABOLIC PANEL
BUN: 22 mg/dL (ref 6–23)
CO2: 26 mEq/L (ref 19–32)
Calcium: 9.7 mg/dL (ref 8.4–10.5)
Chloride: 106 mEq/L (ref 96–112)
Creatinine, Ser: 0.7 mg/dL (ref 0.4–1.2)
GFR: 82.87 mL/min (ref >60.00)
Glucose, Bld: 114 mg/dL — ABNORMAL HIGH (ref 70–99)
Potassium: 5.1 mEq/L (ref 3.5–5.1)
Sodium: 141 mEq/L (ref 135–145)

## 2012-02-20 LAB — HEMOGLOBIN A1C: Hgb A1c MFr Bld: 6.4 % (ref 4.6–6.5)

## 2012-02-20 LAB — LDL CHOLESTEROL, DIRECT: Direct LDL: 168 mg/dL

## 2012-02-20 NOTE — Progress Notes (Signed)
Subjective:    Patient ID: [name], female    DOB: 04-Mar-1945, 67 y.o.   MRN: [id number]  HPI  Here for wellness visit today - feels generally well:  Diet: Heart Healthy and carb modified as diabetic Physical Activities: fit and active Depression/mood screen: Negative Cognition screen: Normal orientation, appropriate identification and recall Hearing:grosslly normal Visual Acuity: Grossly normal, wears reading glasses, sees optho yearly   ADL's: Capable   Fall Risk: None Home Safety: Good End-of-Life Planning: Advance directive - Full code/I agree   also reviewed chronic medical issues:   HTN - generally attributes high BP to stress (mom sick) - Intermittent medical compliance use to side effects  No chest pain, or vision changes; mild occassional left side HA (relieved with ibuprofen)-  Persisting headache since approx 2010 - worse when lying down - MRI 12/2010 unremarkable mild fatigue and myalgia persist if on or off ARB medications   dyslipidemia -prescribed statin - also takes omega 3 - variable statin compliance   DM2 - diet controlled -but less attention to current status and exercise due to illness of mother    Past Medical History  Diagnosis Date  . ALLERGIC RHINITIS   . ANEMIA-NOS   . ASTHMA   . Carpal tunnel syndrome   . COLONIC POLYPS, HX OF   . DIABETES MELLITUS, TYPE II     diet controlled  . GERD   . HYPERLIPIDEMIA   . HYPERTENSION   . OSTEOPENIA   . Rectal fissure   . Psoriasis     began soriatane 01/2012  . Scoliosis    Family History  Problem Relation Age of Onset  . Lung cancer Father   . Arthritis Other     Parents  . Asthma Other     parent, other relative  . Breast cancer Other     other relative  . Hypertension Other     parent, other relative  . Heart disease Other     parent, other relative   History  Substance Use Topics  . Smoking status: Former Smoker    Quit date: 02/14/1974  . Smokeless tobacco: Not on file    . Alcohol Use: No    Review of Systems  Constitutional: Negative for fever or weight change.  Respiratory: Negative for cough and shortness of breath.   Cardiovascular: Negative for chest pain or palpitations.  Gastrointestinal: Negative for abdominal pain, no bowel changes.  Musculoskeletal: Negative for gait problem or joint swelling. intermittent back pain without radiation - ?PA or scoli or DDD? Skin: Negative for rash.  Neurological: Negative for dizziness or dysarthria/weakness. chronic L occipital headache without change  No other specific complaints in a complete review of systems (except as listed in HPI above).     Objective:   Physical Exam  BP 142/98  Pulse 85  Temp 98.4 F (36.9 C) (Oral)  Ht 5\' 2"  (1.575 m)  Wt 141 lb (63.957 kg)  BMI 25.79 kg/m2  SpO2 96% Wt Readings from Last 3 Encounters:  02/20/12 141 lb (63.957 kg)  04/18/11 133 lb 6.4 oz (60.51 kg)  12/20/10 137 lb (62.143 kg)   Constitutional: She appears well-developed and well-nourished. No distress.  HENT: Head: Normocephalic and atraumatic. Ears: B TMs ok, no erythema or effusion; Nose: Nose normal. Mouth/Throat: Oropharynx is clear and moist. No oropharyngeal exudate.  Eyes: Conjunctivae and EOM are normal. Pupils are equal, round, and reactive to light. No scleral icterus.  Neck: Normal range of motion. Neck supple.  No JVD present. No thyromegaly present.  Cardiovascular: Normal rate, regular rhythm and normal heart sounds.  No murmur heard. No BLE edema. Pulmonary/Chest: Effort normal and breath sounds normal. No respiratory distress. She has no wheezes.  Abdominal: Soft. Bowel sounds are normal. She exhibits no distension. There is no tenderness. no masses Musculoskeletal: thoracolumbar scoli with L curve - otherwise normal range of motion, no joint effusions. No gross deformities Neurological: She is alert and oriented to person, place, and time. No cranial nerve deficit. Coordination, speech,  balance and cognition normal.  Skin: Skin is warm and dry. No rash noted. No erythema.  Psychiatric: She has a normal mood and affect. Her behavior is normal. Judgment and thought content normal.   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 Immunizations reviewed and up to date or declined. Labs/ECG reviewed. Risk factors for depression reviewed and negative. Hearing function and visual acuity are intact. ADLs screened and addressed as needed. Functional ability and level of safety reviewed and appropriate. Education, counseling and referrals performed based on assessed risks today. Patient provided with a copy of personalized plan for preventive services.  Also See problem list. Medications and labs reviewed today.

## 2012-02-20 NOTE — Assessment & Plan Note (Signed)
Pt requests [facility] as done there previously Not currently on Ca or Vit D supplements

## 2012-02-20 NOTE — Patient Instructions (Signed)
It was good to see you today. We have reviewed your prior records including labs and tests today - Health Maintenance reviewed - all recommended immunizations and age-appropriate screenings are up-to-date. Medications reviewed and updated, no changes at this time. Refill on medication(s) as discussed today.  we'll make referral for bone density at [facility] and to spine and scoliosis specialist. Our office will contact you regarding appointment(s) once made. Test(s) ordered today. Your results will be called to you after review (48-72hours after test completion). If any changes need to be made, you will be notified at that time. Work on lifestyle changes as discussed (low fat, low carb, increased protein diet; improved exercise efforts; weight loss) to control sugar, blood pressure and cholesterol levels and/or reduce risk of developing other medical problems. Look into [email or web address] or other type of food journal to assist you in this process. Please schedule followup in 6 months for check on diabetes mellitus, cholesterol and blood pressure, call sooner if problems.   Health Maintenance, Females A healthy lifestyle and preventative care can promote health and wellness.  Maintain regular health, dental, and eye exams.   Eat a healthy diet. Foods like vegetables, fruits, whole grains, low-fat dairy products, and lean protein foods contain the nutrients you need without too many calories. Decrease your intake of foods high in solid fats, added sugars, and salt. Get information about a proper diet from your caregiver, if necessary.   Regular physical exercise is one of the most important things you can do for your health. Most adults should get at least 150 minutes of moderate-intensity exercise (any activity that increases your heart rate and causes you to sweat) each week. In addition, most adults need muscle-strengthening exercises on 2 or more days a week.     Maintain a healthy weight. The body  mass index (BMI) is a screening tool to identify possible weight problems. It provides an estimate of body fat based on height and weight. Your caregiver can help determine your BMI, and can help you achieve or maintain a healthy weight. For adults 20 years and older:   A BMI below 18.5 is considered underweight.   A BMI of 18.5 to 24.9 is normal.   A BMI of 25 to 29.9 is considered overweight.   A BMI of 30 and above is considered obese.   Maintain normal blood lipids and cholesterol by exercising and minimizing your intake of saturated fat. Eat a balanced diet with plenty of fruits and vegetables. Blood tests for lipids and cholesterol should begin at age 47 and be repeated every 5 years. If your lipid or cholesterol levels are high, you are over 50, or you are a high risk for heart disease, you may need your cholesterol levels checked more frequently. Ongoing high lipid and cholesterol levels should be treated with medicines if diet and exercise are not effective.   If you smoke, find out from your caregiver how to quit. If you do not use tobacco, do not start.   If you are pregnant, do not drink alcohol. If you are breastfeeding, be very cautious about drinking alcohol. If you are not pregnant and choose to drink alcohol, do not exceed 1 drink per day. One drink is considered to be 12 ounces (355 mL) of beer, 5 ounces (148 mL) of wine, or 1.5 ounces (44 mL) of liquor.   Avoid use of street drugs. Do not share needles with anyone. Ask for help if you need support or instructions about stopping  the use of drugs.   High blood pressure causes heart disease and increases the risk of stroke. Blood pressure should be checked at least every 1 to 2 years. Ongoing high blood pressure should be treated with medicines, if weight loss and exercise are not effective.   If you are 84 to 69 years old, ask your caregiver if you should take aspirin to prevent strokes.   Diabetes screening involves taking a  blood sample to check your fasting blood sugar level. This should be done once every 3 years, after age 63, if you are within normal weight and without risk factors for diabetes. Testing should be considered at a younger age or be carried out more frequently if you are overweight and have at least 1 risk factor for diabetes.   Breast cancer screening is essential preventative care for women. You should practice "breast self-awareness." This means understanding the normal appearance and feel of your breasts and may include breast self-examination. Any changes detected, no matter how small, should be reported to a caregiver. Women in their 38s and 30s should have a clinical breast exam (CBE) by a caregiver as part of a regular health exam every 1 to 3 years. After age 58, women should have a CBE every year. Starting at age 4, women should consider having a mammogram (breast X-ray) every year. Women who have a family history of breast cancer should talk to their caregiver about genetic screening. Women at a high risk of breast cancer should talk to their caregiver about having an MRI and a mammogram every year.   The Pap test is a screening test for cervical cancer. Women should have a Pap test starting at age 62. Between ages 53 and 54, Pap tests should be repeated every 2 years. Beginning at age 69, you should have a Pap test every 3 years as long as the past 3 Pap tests have been normal. If you had a hysterectomy for a problem that was not cancer or a condition that could lead to cancer, then you no longer need Pap tests. If you are between ages 89 and 40, and you have had normal Pap tests going back 10 years, you no longer need Pap tests. If you have had past treatment for cervical cancer or a condition that could lead to cancer, you need Pap tests and screening for cancer for at least 20 years after your treatment. If Pap tests have been discontinued, risk factors (such as a new sexual partner) need to be  reassessed to determine if screening should be resumed. Some women have medical problems that increase the chance of getting cervical cancer. In these cases, your caregiver may recommend more frequent screening and Pap tests.   The human papillomavirus (HPV) test is an additional test that may be used for cervical cancer screening. The HPV test looks for the virus that can cause the cell changes on the cervix. The cells collected during the Pap test can be tested for HPV. The HPV test could be used to screen women aged 44 years and older, and should be used in women of any age who have unclear Pap test results. After the age of 27, women should have HPV testing at the same frequency as a Pap test.   Colorectal cancer can be detected and often prevented. Most routine colorectal cancer screening begins at the age of 33 and continues through age 72. However, your caregiver may recommend screening at an earlier age if you have  risk factors for colon cancer. On a yearly basis, your caregiver may provide home test kits to check for hidden blood in the stool. Use of a small camera at the end of a tube, to directly examine the colon (sigmoidoscopy or colonoscopy), can detect the earliest forms of colorectal cancer. Talk to your caregiver about this at age 57, when routine screening begins. Direct examination of the colon should be repeated every 5 to 10 years through age 49, unless early forms of pre-cancerous polyps or small growths are found.   Hepatitis C blood testing is recommended for all people born from [birth year] through 1965 and any individual with known risks for hepatitis C.   Practice safe sex. Use condoms and avoid high-risk sexual practices to reduce the spread of sexually transmitted infections (STIs). Sexually active women aged 69 and younger should be checked for Chlamydia, which is a common sexually transmitted infection. Older women with new or multiple partners should also be tested for Chlamydia.  Testing for other STIs is recommended if you are sexually active and at increased risk.   Osteoporosis is a disease in which the bones lose minerals and strength with aging. This can result in serious bone fractures. The risk of osteoporosis can be identified using a bone density scan. Women ages 55 and over and women at risk for fractures or osteoporosis should discuss screening with their caregivers. Ask your caregiver whether you should be taking a calcium supplement or vitamin D to reduce the rate of osteoporosis.   Menopause can be associated with physical symptoms and risks. Hormone replacement therapy is available to decrease symptoms and risks. You should talk to your caregiver about whether hormone replacement therapy is right for you.   Use sunscreen with a sun protection factor (SPF) of 30 or greater. Apply sunscreen liberally and repeatedly throughout the day. You should seek shade when your shadow is shorter than you. Protect yourself by wearing long sleeves, pants, a wide-brimmed hat, and sunglasses year round, whenever you are outdoors.   Notify your caregiver of new moles or changes in moles, especially if there is a change in shape or color. Also notify your caregiver if a mole is larger than the size of a pencil eraser.   Stay current with your immunizations.  Document Released: 08/16/2010 Document Revised: 04/25/2011 Document Reviewed: 08/16/2010 [facility] Patient Information 2013 [place], [place].

## 2012-02-20 NOTE — Assessment & Plan Note (Signed)
on omega-3 + variable compliance with statin therapy as prescribed since 2010- represcribed statin therapy 12/2010 Also on better diet and exercise + omega 3 and Co Q 10 Recheck lipid panel annually and prn

## 2012-02-20 NOTE — Assessment & Plan Note (Signed)
Lab Results  Component Value Date   HGBA1C 6.2 04/18/2011   Diet controlled by history - check A1c today to monitor same

## 2012-02-20 NOTE — Assessment & Plan Note (Signed)
Intermittent back pain symptoms -?improved since starting Soriatane 01/2012 Chronic scoli but no radiculopathy Pt requests eval by specialists for "updated options" on same

## 2012-02-24 ENCOUNTER — Other Ambulatory Visit: Payer: Self-pay | Admitting: Internal Medicine

## 2012-02-27 ENCOUNTER — Other Ambulatory Visit: Payer: Self-pay | Admitting: *Deleted

## 2012-02-27 MED ORDER — PRAVASTATIN SODIUM 20 MG PO TABS: 20.0000 mg | ORAL_TABLET | Freq: Every day | ORAL | Status: DC

## 2012-03-19 ENCOUNTER — Telehealth: Payer: Self-pay | Admitting: Internal Medicine

## 2012-03-19 DIAGNOSIS — M899 Disorder of bone, unspecified: Secondary | ICD-10-CM

## 2012-03-19 NOTE — Telephone Encounter (Signed)
Notified pt with md response.../lmb 

## 2012-03-19 NOTE — Telephone Encounter (Signed)
Please call patient: Bone density scan from [facility] 03/09/12 received and reviewed: she remains mildly osteopenic without significant change since 2008 scan. No treatment changes recommended, maintain adequate dietary calcium and vitamin D with daily weight bearing activity (such as walking)

## 2012-03-26 ENCOUNTER — Encounter: Payer: Self-pay | Admitting: Internal Medicine

## 2012-05-08 ENCOUNTER — Other Ambulatory Visit: Payer: Self-pay | Admitting: Internal Medicine

## 2012-08-20 ENCOUNTER — Ambulatory Visit: Payer: 59 | Admitting: Internal Medicine

## 2012-12-28 ENCOUNTER — Ambulatory Visit (INDEPENDENT_AMBULATORY_CARE_PROVIDER_SITE_OTHER): Payer: 59 | Admitting: Internal Medicine

## 2012-12-28 ENCOUNTER — Encounter: Payer: Self-pay | Admitting: Internal Medicine

## 2012-12-28 ENCOUNTER — Other Ambulatory Visit (INDEPENDENT_AMBULATORY_CARE_PROVIDER_SITE_OTHER): Payer: 59

## 2012-12-28 VITALS — BP 160/102 | HR 72 | Temp 97.1°F | Wt 138.0 lb

## 2012-12-28 DIAGNOSIS — E119 Type 2 diabetes mellitus without complications: Secondary | ICD-10-CM

## 2012-12-28 DIAGNOSIS — L409 Psoriasis, unspecified: Secondary | ICD-10-CM

## 2012-12-28 DIAGNOSIS — Z79899 Other long term (current) drug therapy: Secondary | ICD-10-CM

## 2012-12-28 DIAGNOSIS — I10 Essential (primary) hypertension: Secondary | ICD-10-CM

## 2012-12-28 DIAGNOSIS — Z0000 Encounter for general adult medical examination without abnormal findings: Secondary | ICD-10-CM

## 2012-12-28 DIAGNOSIS — L408 Other psoriasis: Secondary | ICD-10-CM

## 2012-12-28 LAB — CBC WITH DIFFERENTIAL/PLATELET
Basophils Absolute: 0.1 10*3/uL (ref 0.0–0.1)
Basophils Relative: 0.7 % (ref 0.0–3.0)
Eosinophils Absolute: 0.2 10*3/uL (ref 0.0–0.7)
Eosinophils Relative: 2.9 % (ref 0.0–5.0)
HCT: 39.5 % (ref 36.0–46.0)
Hemoglobin: 13.3 g/dL (ref 12.0–15.0)
Lymphocytes Relative: 25.3 % (ref 12.0–46.0)
Lymphs Abs: 2.2 10*3/uL (ref 0.7–4.0)
MCHC: 33.7 g/dL (ref 30.0–36.0)
MCV: 92.3 fl (ref 78.0–100.0)
Monocytes Absolute: 0.5 10*3/uL (ref 0.1–1.0)
Monocytes Relative: 6.3 % (ref 3.0–12.0)
Neutro Abs: 5.6 10*3/uL (ref 1.4–7.7)
Neutrophils Relative %: 64.8 % (ref 43.0–77.0)
Platelets: 304 10*3/uL (ref 150.0–400.0)
RBC: 4.28 Mil/uL (ref 3.87–5.11)
RDW: 13.6 % (ref 11.5–14.6)
WBC: 8.6 10*3/uL (ref 4.5–10.5)

## 2012-12-28 LAB — HEPATIC FUNCTION PANEL
ALT: 27 U/L (ref 0–35)
AST: 21 U/L (ref 0–37)
Albumin: 4.1 g/dL (ref 3.5–5.2)
Alkaline Phosphatase: 69 U/L (ref 39–117)
Bilirubin, Direct: 0.1 mg/dL (ref 0.0–0.3)
Total Bilirubin: 0.9 mg/dL (ref 0.3–1.2)
Total Protein: 7.6 g/dL (ref 6.0–8.3)

## 2012-12-28 LAB — HEMOGLOBIN A1C: Hgb A1c MFr Bld: 6.4 % (ref 4.6–6.5)

## 2012-12-28 MED ORDER — OLMESARTAN MEDOXOMIL 20 MG PO TABS: 40.0000 mg | ORAL_TABLET | Freq: Every day | ORAL | Status: DC

## 2012-12-28 NOTE — Assessment & Plan Note (Signed)
Lab Results  Component Value Date   HGBA1C 6.4 02/20/2012   Diet controlled by history - check A1c today to monitor same

## 2012-12-28 NOTE — Assessment & Plan Note (Signed)
Following at [facility] since summer 2014 Changed from soriatane to [facility] 09/2012 - skeletal symptoms improved but concerned about side effects -  will check cbc and lft now *v58.69

## 2012-12-28 NOTE — Patient Instructions (Signed)
It was good to see you today.  Health Maintenance reviewed - all recommended immunizations and age-appropriate screenings are up-to-date.  Medications reviewed and updated, increase Benicar to 40mg  daily for blood pressure  Test(s) ordered today. Your results will be called to you after review (48-72hours after test completion). If any changes need to be made, you will be notified at that time.  Work on lifestyle changes as discussed (low fat, low carb, increased protein diet; improved exercise efforts; weight loss) to control sugar, blood pressure and cholesterol levels and/or reduce risk of developing other medical problems. Look into [email or web address] or other type of food journal to assist you in this process.  Please schedule followup in 02/2013 for annual wellness exam and labs  Health Maintenance, Females A healthy lifestyle and preventative care can promote health and wellness.  Maintain regular health, dental, and eye exams.   Eat a healthy diet. Foods like vegetables, fruits, whole grains, low-fat dairy products, and lean protein foods contain the nutrients you need without too many calories. Decrease your intake of foods high in solid fats, added sugars, and salt. Get information about a proper diet from your caregiver, if necessary.   Regular physical exercise is one of the most important things you can do for your health. Most adults should get at least 150 minutes of moderate-intensity exercise (any activity that increases your heart rate and causes you to sweat) each week. In addition, most adults need muscle-strengthening exercises on 2 or more days a week.     Maintain a healthy weight. The body mass index (BMI) is a screening tool to identify possible weight problems. It provides an estimate of body fat based on height and weight. Your caregiver can help determine your BMI, and can help you achieve or maintain a healthy weight. For adults 20 years and older:   A BMI below 18.5  is considered underweight.   A BMI of 18.5 to 24.9 is normal.   A BMI of 25 to 29.9 is considered overweight.   A BMI of 30 and above is considered obese.   Maintain normal blood lipids and cholesterol by exercising and minimizing your intake of saturated fat. Eat a balanced diet with plenty of fruits and vegetables. Blood tests for lipids and cholesterol should begin at age 12 and be repeated every 5 years. If your lipid or cholesterol levels are high, you are over 50, or you are a high risk for heart disease, you may need your cholesterol levels checked more frequently. Ongoing high lipid and cholesterol levels should be treated with medicines if diet and exercise are not effective.   If you smoke, find out from your caregiver how to quit. If you do not use tobacco, do not start.   If you are pregnant, do not drink alcohol. If you are breastfeeding, be very cautious about drinking alcohol. If you are not pregnant and choose to drink alcohol, do not exceed 1 drink per day. One drink is considered to be 12 ounces (355 mL) of beer, 5 ounces (148 mL) of wine, or 1.5 ounces (44 mL) of liquor.   Avoid use of street drugs. Do not share needles with anyone. Ask for help if you need support or instructions about stopping the use of drugs.   High blood pressure causes heart disease and increases the risk of stroke. Blood pressure should be checked at least every 1 to 2 years. Ongoing high blood pressure should be treated with medicines, if weight loss and  exercise are not effective.   If you are 55 to 69 years old, ask your caregiver if you should take aspirin to prevent strokes.   Diabetes screening involves taking a blood sample to check your fasting blood sugar level. This should be done once every 3 years, after age 25, if you are within normal weight and without risk factors for diabetes. Testing should be considered at a younger age or be carried out more frequently if you are overweight and have  at least 1 risk factor for diabetes.   Breast cancer screening is essential preventative care for women. You should practice "breast self-awareness." This means understanding the normal appearance and feel of your breasts and may include breast self-examination. Any changes detected, no matter how small, should be reported to a caregiver. Women in their 38s and 30s should have a clinical breast exam (CBE) by a caregiver as part of a regular health exam every 1 to 3 years. After age 69, women should have a CBE every year. Starting at age 53, women should consider having a mammogram (breast X-ray) every year. Women who have a family history of breast cancer should talk to their caregiver about genetic screening. Women at a high risk of breast cancer should talk to their caregiver about having an MRI and a mammogram every year.   The Pap test is a screening test for cervical cancer. Women should have a Pap test starting at age 27. Between ages 16 and 38, Pap tests should be repeated every 2 years. Beginning at age 23, you should have a Pap test every 3 years as long as the past 3 Pap tests have been normal. If you had a hysterectomy for a problem that was not cancer or a condition that could lead to cancer, then you no longer need Pap tests. If you are between ages 8 and 28, and you have had normal Pap tests going back 10 years, you no longer need Pap tests. If you have had past treatment for cervical cancer or a condition that could lead to cancer, you need Pap tests and screening for cancer for at least 20 years after your treatment. If Pap tests have been discontinued, risk factors (such as a new sexual partner) need to be reassessed to determine if screening should be resumed. Some women have medical problems that increase the chance of getting cervical cancer. In these cases, your caregiver may recommend more frequent screening and Pap tests.   The human papillomavirus (HPV) test is an additional test that  may be used for cervical cancer screening. The HPV test looks for the virus that can cause the cell changes on the cervix. The cells collected during the Pap test can be tested for HPV. The HPV test could be used to screen women aged 90 years and older, and should be used in women of any age who have unclear Pap test results. After the age of 4, women should have HPV testing at the same frequency as a Pap test.   Colorectal cancer can be detected and often prevented. Most routine colorectal cancer screening begins at the age of 78 and continues through age 23. However, your caregiver may recommend screening at an earlier age if you have risk factors for colon cancer. On a yearly basis, your caregiver may provide home test kits to check for hidden blood in the stool. Use of a small camera at the end of a tube, to directly examine the colon (sigmoidoscopy or colonoscopy),  can detect the earliest forms of colorectal cancer. Talk to your caregiver about this at age 17, when routine screening begins. Direct examination of the colon should be repeated every 5 to 10 years through age 75, unless early forms of pre-cancerous polyps or small growths are found.   Hepatitis C blood testing is recommended for all people born from [birth year] through 1967 and any individual with known risks for hepatitis C.   Practice safe sex. Use condoms and avoid high-risk sexual practices to reduce the spread of sexually transmitted infections (STIs). Sexually active women aged 8 and younger should be checked for Chlamydia, which is a common sexually transmitted infection. Older women with new or multiple partners should also be tested for Chlamydia. Testing for other STIs is recommended if you are sexually active and at increased risk.   Osteoporosis is a disease in which the bones lose minerals and strength with aging. This can result in serious bone fractures. The risk of osteoporosis can be identified using a bone density scan. Women  ages 76 and over and women at risk for fractures or osteoporosis should discuss screening with their caregivers. Ask your caregiver whether you should be taking a calcium supplement or vitamin D to reduce the rate of osteoporosis.   Menopause can be associated with physical symptoms and risks. Hormone replacement therapy is available to decrease symptoms and risks. You should talk to your caregiver about whether hormone replacement therapy is right for you.   Use sunscreen with a sun protection factor (SPF) of 30 or greater. Apply sunscreen liberally and repeatedly throughout the day. You should seek shade when your shadow is shorter than you. Protect yourself by wearing long sleeves, pants, a wide-brimmed hat, and sunglasses year round, whenever you are outdoors.   Notify your caregiver of new moles or changes in moles, especially if there is a change in shape or color. Also notify your caregiver if a mole is larger than the size of a pencil eraser.   Stay current with your immunizations.  Document Released: 08/16/2010 Document Revised: 04/25/2011 Document Reviewed: 08/16/2010 [facility] Patient Information 2013 [place], [place].

## 2012-12-28 NOTE — Assessment & Plan Note (Signed)
Uncontrolled today, variable compliance with prescribed therapy Renewal on [facility]B prescription 12/2010 - but using less dose than rx'd Home BP readings reviewed -  Increase to max dose Benicar 40mg  qd encourage followup to review adequate control of blood pressure and titration of meds as needed BP Readings from Last 3 Encounters:  12/28/12 160/102  02/20/12 142/98  04/18/11 130/92

## 2012-12-28 NOTE — Progress Notes (Signed)
  Subjective:    Patient ID: [name], female    DOB: [birth year], 67 y.o.   MRN: [id number]  HPI Here to review medication concerns -Embrel   also reviewed chronic medical issues:   HTN, dyslipidemia, DM2 (diet controlled)   Past Medical History  Diagnosis Date  . ALLERGIC RHINITIS   . ANEMIA-NOS   . ASTHMA   . Carpal tunnel syndrome   . COLONIC POLYPS, HX OF   . DIABETES MELLITUS, TYPE II     diet controlled  . GERD   . HYPERLIPIDEMIA   . HYPERTENSION   . OSTEOPENIA   . Rectal fissure   . Psoriasis     began soriatane 01/2012  . Scoliosis     Review of Systems  Cardiovascular: Negative for chest pain and leg swelling.  Gastrointestinal: Negative for diarrhea and constipation.  Psychiatric/Behavioral: Positive for dysphoric mood. Negative for sleep disturbance and self-injury. The patient is nervous/anxious.         Objective:   Physical Exam BP 160/102  Pulse 72  Temp(Src) 97.1 F (36.2 C) (Oral)  Wt 138 lb (62.596 kg)  SpO2 97% Wt Readings from Last 3 Encounters:  12/28/12 138 lb (62.596 kg)  02/20/12 141 lb (63.957 kg)  04/18/11 133 lb 6.4 oz (60.51 kg)   Constitutional: She appears well-developed and well-nourished. No distress.  Cardiovascular: Normal rate, regular rhythm and normal heart sounds.  No murmur heard. No BLE edema. Pulmonary/Chest: Effort normal and breath sounds normal. No respiratory distress. She has no wheezes.  Psychiatric: She has an anxious mood and affect. Her behavior is normal. Judgment and thought content normal.   Lab Results  Component Value Date   WBC 8.2 01/27/2012   HGB 13.9 01/27/2012   HCT 39.8 04/18/2011   PLT 304 01/27/2012   GLUCOSE 114* 02/20/2012   CHOL 253* 02/20/2012   TRIG 200.0* 02/20/2012   HDL 50.20 02/20/2012   LDLDIRECT 168.0 02/20/2012   LDLCALC 113* 06/03/2008   ALT 68* 02/20/2012   AST 39* 02/20/2012   NA 141 02/20/2012   K 5.1 02/20/2012   CL 106 02/20/2012   CREATININE 0.7 02/20/2012   BUN 22 02/20/2012   CO2 26 02/20/2012   TSH 1.58 04/18/2011   HGBA1C 6.4 02/20/2012       Assessment & Plan:   AWV/v70.0 - reviewed same issues as 02/2012 but told by insurance no "wellness" has been billed in 2014 - resubmit now  Also see problem list. Medications and labs reviewed today.

## 2013-01-30 ENCOUNTER — Other Ambulatory Visit: Payer: Self-pay | Admitting: Internal Medicine

## 2013-01-30 ENCOUNTER — Other Ambulatory Visit: Payer: Self-pay

## 2013-01-30 DIAGNOSIS — Z9889 Other specified postprocedural states: Secondary | ICD-10-CM

## 2013-01-30 DIAGNOSIS — Z1231 Encounter for screening mammogram for malignant neoplasm of breast: Secondary | ICD-10-CM

## 2013-02-12 ENCOUNTER — Other Ambulatory Visit: Payer: Self-pay

## 2013-02-12 ENCOUNTER — Ambulatory Visit
Admission: RE | Admit: 2013-02-12 | Discharge: 2013-02-12 | Disposition: A | Discharge status: Home / Self Care | Payer: Medicare Other | Source: Ambulatory Visit

## 2013-02-12 DIAGNOSIS — Z9889 Other specified postprocedural states: Secondary | ICD-10-CM

## 2013-02-12 DIAGNOSIS — Z1231 Encounter for screening mammogram for malignant neoplasm of breast: Secondary | ICD-10-CM

## 2013-03-04 ENCOUNTER — Ambulatory Visit (INDEPENDENT_AMBULATORY_CARE_PROVIDER_SITE_OTHER): Payer: Medicare Other | Admitting: Internal Medicine

## 2013-03-04 ENCOUNTER — Encounter: Payer: Self-pay | Admitting: Internal Medicine

## 2013-03-04 ENCOUNTER — Other Ambulatory Visit (INDEPENDENT_AMBULATORY_CARE_PROVIDER_SITE_OTHER): Payer: Medicare Other

## 2013-03-04 VITALS — BP 142/98 | HR 89 | Temp 98.3°F | Ht 62.0 in | Wt 139.4 lb

## 2013-03-04 DIAGNOSIS — E785 Hyperlipidemia, unspecified: Secondary | ICD-10-CM

## 2013-03-04 DIAGNOSIS — E119 Type 2 diabetes mellitus without complications: Secondary | ICD-10-CM

## 2013-03-04 DIAGNOSIS — Z0000 Encounter for general adult medical examination without abnormal findings: Secondary | ICD-10-CM

## 2013-03-04 DIAGNOSIS — M899 Disorder of bone, unspecified: Secondary | ICD-10-CM

## 2013-03-04 DIAGNOSIS — M949 Disorder of cartilage, unspecified: Secondary | ICD-10-CM

## 2013-03-04 DIAGNOSIS — I10 Essential (primary) hypertension: Secondary | ICD-10-CM

## 2013-03-04 DIAGNOSIS — H9190 Unspecified hearing loss, unspecified ear: Secondary | ICD-10-CM

## 2013-03-04 LAB — LIPID PANEL
Cholesterol: 291 mg/dL — ABNORMAL HIGH (ref 0–200)
HDL: 56.5 mg/dL (ref >39.00)
Total CHOL/HDL Ratio: 5
Triglycerides: 174 mg/dL — ABNORMAL HIGH (ref 0.0–149.0)
VLDL: 34.8 mg/dL (ref 0.0–40.0)

## 2013-03-04 LAB — LDL CHOLESTEROL, DIRECT: Direct LDL: 203.8 mg/dL

## 2013-03-04 LAB — BASIC METABOLIC PANEL
BUN: 14 mg/dL (ref 6–23)
CO2: 28 mEq/L (ref 19–32)
Calcium: 9.8 mg/dL (ref 8.4–10.5)
Chloride: 105 mEq/L (ref 96–112)
Creatinine, Ser: 0.9 mg/dL (ref 0.4–1.2)
GFR: 68.54 mL/min (ref >60.00)
Glucose, Bld: 113 mg/dL — ABNORMAL HIGH (ref 70–99)
Potassium: 4.3 mEq/L (ref 3.5–5.1)
Sodium: 141 mEq/L (ref 135–145)

## 2013-03-04 LAB — MAGNESIUM: Magnesium: 1.8 mg/dL (ref 1.5–2.5)

## 2013-03-04 LAB — HEMOGLOBIN A1C: Hgb A1c MFr Bld: 6.4 % (ref 4.6–6.5)

## 2013-03-04 MED ORDER — ONETOUCH ULTRASOFT LANCETS MISC: Status: DC

## 2013-03-04 MED ORDER — PRAVASTATIN SODIUM 20 MG PO TABS: 20.0000 mg | ORAL_TABLET | Freq: Every day | ORAL | Status: DC

## 2013-03-04 MED ORDER — OLMESARTAN MEDOXOMIL 20 MG PO TABS: 20.0000 mg | ORAL_TABLET | Freq: Every day | ORAL | Status: DC

## 2013-03-04 MED ORDER — GLUCOSE BLOOD VI STRP: ORAL_STRIP | Status: DC

## 2013-03-04 NOTE — Assessment & Plan Note (Signed)
Pt requests [facility] q24h as done there previously Not currently on Ca or Vit D supplements

## 2013-03-04 NOTE — Patient Instructions (Addendum)
It was good to see you today.  Health Maintenance reviewed - all recommended immunizations and age-appropriate screenings are up-to-date. Ask about Prevnar as discussed  Medications reviewed and updated, no changes recommended  Refill on medication(s) as discussed today.  Test(s) ordered today. Your results will be called to you after review (48-72hours after test completion). If any changes need to be made, you will be notified at that time.  we'll make referral to audiology for hearing test. Our office will contact you regarding appointment(s) once made.  Work on lifestyle changes as discussed (low fat, low carb, increased protein diet; improved exercise efforts; weight loss) to control sugar, blood pressure and cholesterol levels and/or reduce risk of developing other medical problems. Look into [email or web address] or other type of food journal to assist you in this process.  Please schedule followup in 6 months for diabetes mellitus exam and labs   Health Maintenance, Female A healthy lifestyle and preventative care can promote health and wellness.  Maintain regular health, dental, and eye exams.  Eat a healthy diet. Foods like vegetables, fruits, whole grains, low-fat dairy products, and lean protein foods contain the nutrients you need without too many calories. Decrease your intake of foods high in solid fats, added sugars, and salt. Get information about a proper diet from your caregiver, if necessary.  Regular physical exercise is one of the most important things you can do for your health. Most adults should get at least 150 minutes of moderate-intensity exercise (any activity that increases your heart rate and causes you to sweat) each week. In addition, most adults need muscle-strengthening exercises on 2 or more days a week.   Maintain a healthy weight. The body mass index (BMI) is a screening tool to identify possible weight problems. It provides an estimate of body fat based on  height and weight. Your caregiver can help determine your BMI, and can help you achieve or maintain a healthy weight. For adults 20 years and older:  A BMI below 18.5 is considered underweight.  A BMI of 18.5 to 24.9 is normal.  A BMI of 25 to 29.9 is considered overweight.  A BMI of 30 and above is considered obese.  Maintain normal blood lipids and cholesterol by exercising and minimizing your intake of saturated fat. Eat a balanced diet with plenty of fruits and vegetables. Blood tests for lipids and cholesterol should begin at age 79 and be repeated every 5 years. If your lipid or cholesterol levels are high, you are over 50, or you are a high risk for heart disease, you may need your cholesterol levels checked more frequently.Ongoing high lipid and cholesterol levels should be treated with medicines if diet and exercise are not effective.  If you smoke, find out from your caregiver how to quit. If you do not use tobacco, do not start.  Lung cancer screening is recommended for adults aged 69 80 years who are at high risk for developing lung cancer because of a history of smoking. Yearly low-dose computed tomography (CT) is recommended for people who have at least a 30-pack-year history of smoking and are a current smoker or have quit within the past 15 years. A pack year of smoking is smoking an average of 1 pack of cigarettes a day for 1 year (for example: 1 pack a day for 30 years or 2 packs a day for 15 years). Yearly screening should continue until the smoker has stopped smoking for at least 15 years. Yearly screening should also  be stopped for people who develop a health problem that would prevent them from having lung cancer treatment.  If you are pregnant, do not drink alcohol. If you are breastfeeding, be very cautious about drinking alcohol. If you are not pregnant and choose to drink alcohol, do not exceed 1 drink per day. One drink is considered to be 12 ounces (355 mL) of beer, 5  ounces (148 mL) of wine, or 1.5 ounces (44 mL) of liquor.  Avoid use of street drugs. Do not share needles with anyone. Ask for help if you need support or instructions about stopping the use of drugs.  High blood pressure causes heart disease and increases the risk of stroke. Blood pressure should be checked at least every 1 to 2 years. Ongoing high blood pressure should be treated with medicines, if weight loss and exercise are not effective.  If you are 30 to 69 years old, ask your caregiver if you should take aspirin to prevent strokes.  Diabetes screening involves taking a blood sample to check your fasting blood sugar level. This should be done once every 3 years, after age 12, if you are within normal weight and without risk factors for diabetes. Testing should be considered at a younger age or be carried out more frequently if you are overweight and have at least 1 risk factor for diabetes.  Breast cancer screening is essential preventative care for women. You should practice "breast self-awareness." This means understanding the normal appearance and feel of your breasts and may include breast self-examination. Any changes detected, no matter how small, should be reported to a caregiver. Women in their 26s and 30s should have a clinical breast exam (CBE) by a caregiver as part of a regular health exam every 1 to 3 years. After age 54, women should have a CBE every year. Starting at age 63, women should consider having a mammogram (breast X-ray) every year. Women who have a family history of breast cancer should talk to their caregiver about genetic screening. Women at a high risk of breast cancer should talk to their caregiver about having an MRI and a mammogram every year.  Breast cancer gene (BRCA)-related cancer risk assessment is recommended for women who have family members with BRCA-related cancers. BRCA-related cancers include breast, ovarian, tubal, and peritoneal cancers. Having family  members with these cancers may be associated with an increased risk for harmful changes (mutations) in the breast cancer genes BRCA1 and BRCA2. Results of the assessment will determine the need for genetic counseling and BRCA1 and BRCA2 testing.  The Pap test is a screening test for cervical cancer. Women should have a Pap test starting at age 39. Between ages 71 and 58, Pap tests should be repeated every 2 years. Beginning at age 59, you should have a Pap test every 3 years as long as the past 3 Pap tests have been normal. If you had a hysterectomy for a problem that was not cancer or a condition that could lead to cancer, then you no longer need Pap tests. If you are between ages 105 and 56, and you have had normal Pap tests going back 10 years, you no longer need Pap tests. If you have had past treatment for cervical cancer or a condition that could lead to cancer, you need Pap tests and screening for cancer for at least 20 years after your treatment. If Pap tests have been discontinued, risk factors (such as a new sexual partner) need to be reassessed  to determine if screening should be resumed. Some women have medical problems that increase the chance of getting cervical cancer. In these cases, your caregiver may recommend more frequent screening and Pap tests.  The human papillomavirus (HPV) test is an additional test that may be used for cervical cancer screening. The HPV test looks for the virus that can cause the cell changes on the cervix. The cells collected during the Pap test can be tested for HPV. The HPV test could be used to screen women aged 32 years and older, and should be used in women of any age who have unclear Pap test results. After the age of 81, women should have HPV testing at the same frequency as a Pap test.  Colorectal cancer can be detected and often prevented. Most routine colorectal cancer screening begins at the age of 52 and continues through age 23. However, your caregiver  may recommend screening at an earlier age if you have risk factors for colon cancer. On a yearly basis, your caregiver may provide home test kits to check for hidden blood in the stool. Use of a small camera at the end of a tube, to directly examine the colon (sigmoidoscopy or colonoscopy), can detect the earliest forms of colorectal cancer. Talk to your caregiver about this at age 8, when routine screening begins. Direct examination of the colon should be repeated every 5 to 10 years through age 66, unless early forms of pre-cancerous polyps or small growths are found.  Hepatitis C blood testing is recommended for all people born from [birth year] through 1967 and any individual with known risks for hepatitis C.  Practice safe sex. Use condoms and avoid high-risk sexual practices to reduce the spread of sexually transmitted infections (STIs). Sexually active women aged 51 and younger should be checked for Chlamydia, which is a common sexually transmitted infection. Older women with new or multiple partners should also be tested for Chlamydia. Testing for other STIs is recommended if you are sexually active and at increased risk.  Osteoporosis is a disease in which the bones lose minerals and strength with aging. This can result in serious bone fractures. The risk of osteoporosis can be identified using a bone density scan. Women ages 58 and over and women at risk for fractures or osteoporosis should discuss screening with their caregivers. Ask your caregiver whether you should be taking a calcium supplement or vitamin D to reduce the rate of osteoporosis.  Menopause can be associated with physical symptoms and risks. Hormone replacement therapy is available to decrease symptoms and risks. You should talk to your caregiver about whether hormone replacement therapy is right for you.  Use sunscreen. Apply sunscreen liberally and repeatedly throughout the day. You should seek shade when your shadow is shorter than  you. Protect yourself by wearing long sleeves, pants, a wide-brimmed hat, and sunglasses year round, whenever you are outdoors.  Notify your caregiver of new moles or changes in moles, especially if there is a change in shape or color. Also notify your caregiver if a mole is larger than the size of a pencil eraser.  Stay current with your immunizations. Document Released: 08/16/2010 Document Revised: 05/28/2012 Document Reviewed: 08/16/2010 [facility] Patient Information 2014 [place].

## 2013-03-04 NOTE — Progress Notes (Signed)
Pre-visit discussion using our clinic review tool. No additional management support is needed unless otherwise documented below in the visit note.  

## 2013-03-04 NOTE — Assessment & Plan Note (Signed)
Uncontrolled today, variable compliance with prescribed therapy Renewal on ARB prescription 12/2010 - but using less dose than rx'd Home BP readings reviewed -  Increased to max dose Benicar 40mg  qd 12/2012 but intolerant of same Continue 20mg  daily and work on life style control as able encourage followup to review adequate control of blood pressure and titration of meds as needed BP Readings from Last 3 Encounters:  03/04/13 142/98  12/28/12 160/102  02/20/12 142/98

## 2013-03-04 NOTE — Progress Notes (Signed)
Subjective:    Patient ID: [name], female    DOB: 07/15/1945, 68 y.o.   MRN: [id number]  HPI  Patient here today for annual wellness and physical exam.  Chronic medical issues also reviewed.     Here for medicare wellness  Diet: heart healthy and diabetic Physical activity: sedentary Depression/mood screen: negative Hearing: intact to whispered voice Visual acuity: grossly normal, performs annual eye exam  ADLs: capable Fall risk: none Home safety: good Cognitive evaluation: intact to orientation, naming, recall and repetition EOL planning: adv directives, full code/ I agree  I have personally reviewed and have noted 1. The patient's medical and social history 2. Their use of alcohol, tobacco or illicit drugs 3. Their current medications and supplements 4. The patient's functional ability including ADL's, fall risks, home safety risks and hearing or visual impairment. 5. Diet and physical activities 6. Evidence for depression or mood disorders  Also reviewed chronic medical issues:  HTN - maintained on ARB with suboptimal blood pressure control.  Benicar dose increased to 40mg  daily 12/2012.  Pt reports compliance with current therapy.  Home blood pressure readings reviewed. No CV symptoms.   Dyslipidemia - on pravachol.  Reports compliance with current therapy.  No adverse reactions noted.   Type II diabetes - diet controlled.  Pt denies polyuria, polyphagia.    Psoriasis - followed at [facility] since summer 2014.  Changed from soriatane to [facility] 09/2012 with improvement in symptoms.   Past Medical History  Diagnosis Date  . ALLERGIC RHINITIS   . ANEMIA-NOS   . ASTHMA   . Carpal tunnel syndrome   . COLONIC POLYPS, HX OF   . DIABETES MELLITUS, TYPE II     diet controlled  . GERD   . HYPERLIPIDEMIA   . HYPERTENSION   . OSTEOPENIA   . Rectal fissure   . Psoriasis     began soriatane 01/2012  . Scoliosis    Family History  Problem Relation Age of  Onset  . Lung cancer Father   . Arthritis Other     Parents  . Asthma Other     parent, other relative  . Breast cancer Other     other relative  . Hypertension Other     parent, other relative  . Heart disease Other     parent, other relative   History  Substance Use Topics  . Smoking status: Former Smoker    Quit date: 02/14/1974  . Smokeless tobacco: Not on file  . Alcohol Use: No    Review of Systems  Constitutional: Positive for fatigue. Negative for fever and unexpected weight change.  HENT: Positive for hearing loss.   Respiratory: Negative for cough, shortness of breath and wheezing.   Cardiovascular: Negative for chest pain, palpitations and leg swelling.  Gastrointestinal: Negative for nausea, abdominal pain and diarrhea.  Endocrine: Positive for polyphagia.  Neurological: Negative for dizziness, weakness, light-headedness and headaches.  Hematological: Bruises/bleeds easily.  Psychiatric/Behavioral: Negative for dysphoric mood. The patient is not nervous/anxious.   All other systems reviewed and are negative.       Objective:   Physical Exam BP 142/98  Pulse 89  Temp(Src) 98.3 F (36.8 C) (Oral)  Ht 5\' 2"  (1.575 m)  Wt 139 lb 6.4 oz (63.231 kg)  BMI 25.49 kg/m2  SpO2 98% Wt Readings from Last 3 Encounters:  12/28/12 138 lb (62.596 kg)  02/20/12 141 lb (63.957 kg)  04/18/11 133 lb 6.4 oz (60.51 kg)   Constitutional:  She appears well-developed and well-nourished. No distress.  HENT: Head: Normocephalic and atraumatic. Ears: B TMs ok, no erythema or effusion; Nose: Nose normal. Mouth/Throat: Oropharynx is clear and moist. No oropharyngeal exudate.  Eyes: Conjunctivae and EOM are normal. Pupils are equal, round, and reactive to light. No scleral icterus.  Neck: Normal range of motion. Neck supple. No JVD present. No thyromegaly present.  Cardiovascular: Normal rate, regular rhythm and normal heart sounds.  No murmur heard. No BLE edema. Pulmonary/Chest:  Effort normal and breath sounds normal. No respiratory distress. She has no wheezes.  Abdominal: Soft. Bowel sounds are normal. She exhibits no distension. There is no tenderness. no masses Musculoskeletal: Normal range of motion, no joint effusions. No gross deformities Neurological: She is alert and oriented to person, place, and time. No cranial nerve deficit. Coordination, balance, strength, speech and gait are normal.  Skin: Skin is warm and dry. No rash noted. No erythema.  Psychiatric: She has a normal mood and affect. Her behavior is normal. Judgment and thought content normal.    Lab Results  Component Value Date   WBC 8.6 12/28/2012   HGB 13.3 12/28/2012   HCT 39.5 12/28/2012   PLT 304.0 12/28/2012   GLUCOSE 114* 02/20/2012   CHOL 253* 02/20/2012   TRIG 200.0* 02/20/2012   HDL 50.20 02/20/2012   LDLDIRECT 168.0 02/20/2012   LDLCALC 113* 06/03/2008   ALT 27 12/28/2012   AST 21 12/28/2012   NA 141 02/20/2012   K 5.1 02/20/2012   CL 106 02/20/2012   CREATININE 0.7 02/20/2012   BUN 22 02/20/2012   CO2 26 02/20/2012   TSH 1.58 04/18/2011   HGBA1C 6.4 12/28/2012       Assessment & Plan:   CPX/v70.0 - Patient has been counseled on age-appropriate routine health concerns for screening and prevention. These are reviewed and up-to-date. Immunizations are up-to-date or declined. Labs ordered and reviewed.  Also See problem list. Medications and labs reviewed today.  Decreased hearing -no abnormality evident on exam. Refer to audiology for further hearing evaluation and treatment as needed

## 2013-03-04 NOTE — Assessment & Plan Note (Signed)
Lab Results  Component Value Date   HGBA1C 6.4 12/28/2012   Diet controlled by history - check A1c today to monitor same

## 2013-03-04 NOTE — Assessment & Plan Note (Signed)
on omega-3 + variable compliance with statin therapy as prescribed since 2010- represcribed statin therapy 12/2010, but refuses same due to concern for side effects  Also on better diet and exercise + omega 3 and Co Q 10 Recheck lipid panel annually and prn

## 2013-03-05 LAB — VITAMIN D 25 HYDROXY (VIT D DEFICIENCY, FRACTURES): Vit D, 25-Hydroxy: 56 ng/mL (ref 30–89)

## 2013-03-07 ENCOUNTER — Other Ambulatory Visit: Payer: Self-pay | Admitting: Internal Medicine

## 2013-04-17 LAB — HM DIABETES EYE EXAM

## 2013-04-22 ENCOUNTER — Encounter: Payer: Self-pay | Admitting: Internal Medicine

## 2013-06-07 ENCOUNTER — Other Ambulatory Visit: Payer: Self-pay | Admitting: Internal Medicine

## 2013-06-17 ENCOUNTER — Other Ambulatory Visit (INDEPENDENT_AMBULATORY_CARE_PROVIDER_SITE_OTHER): Payer: Medicare Other

## 2013-06-17 ENCOUNTER — Ambulatory Visit (INDEPENDENT_AMBULATORY_CARE_PROVIDER_SITE_OTHER): Payer: Medicare Other | Admitting: Internal Medicine

## 2013-06-17 ENCOUNTER — Encounter: Payer: Self-pay | Admitting: Internal Medicine

## 2013-06-17 VITALS — BP 148/102 | HR 74 | Temp 97.1°F | Wt 140.2 lb

## 2013-06-17 DIAGNOSIS — E785 Hyperlipidemia, unspecified: Secondary | ICD-10-CM

## 2013-06-17 DIAGNOSIS — E119 Type 2 diabetes mellitus without complications: Secondary | ICD-10-CM

## 2013-06-17 DIAGNOSIS — I10 Essential (primary) hypertension: Secondary | ICD-10-CM

## 2013-06-17 DIAGNOSIS — R0789 Other chest pain: Secondary | ICD-10-CM

## 2013-06-17 LAB — LIPID PANEL
Cholesterol: 230 mg/dL — ABNORMAL HIGH (ref 0–200)
HDL: 46.4 mg/dL (ref >39.00)
LDL Cholesterol: 138 mg/dL — ABNORMAL HIGH (ref 0–99)
Total CHOL/HDL Ratio: 5
Triglycerides: 226 mg/dL — ABNORMAL HIGH (ref 0.0–149.0)
VLDL: 45.2 mg/dL — ABNORMAL HIGH (ref 0.0–40.0)

## 2013-06-17 LAB — HEMOGLOBIN A1C: Hgb A1c MFr Bld: 6.1 % (ref 4.6–6.5)

## 2013-06-17 NOTE — Assessment & Plan Note (Signed)
Uncontrolled today, but reviewed home BP log from past week demonstrating SBP 102-140  Reviewed variable compliance with prescribed therapy Rx'd increased to max dose Benicar 40mg  qd 12/2012 but intolerant of same Continue 20mg  daily and work on life style control as able encourage followup to review adequate control of blood pressure and titration of meds as needed BP Readings from Last 3 Encounters:  06/17/13 148/102  03/04/13 142/98  12/28/12 160/102

## 2013-06-17 NOTE — Patient Instructions (Signed)
It was good to see you today.  We have reviewed your prior records including labs and tests today  Test(s) ordered today. Your results will be released to [place] (or called to you) after review, usually within 72hours after test completion. If any changes need to be made, you will be notified at that same time.  Medications reviewed and updated, no changes recommended at this time. Refill on medication(s) as discussed today.  we'll make referral for exercise stress test. Our office will contact you regarding appointment(s) once made.  Please schedule followup in 3-4 months, call sooner if problems.

## 2013-06-17 NOTE — Assessment & Plan Note (Signed)
Lab Results  Component Value Date   HGBA1C 6.4 03/04/2013   Diet controlled by history - check A1c today to monitor same

## 2013-06-17 NOTE — Assessment & Plan Note (Signed)
on omega-3 + variable compliance with statin therapy as prescribed since 2010- represcribed statin therapy 12/2010, but refused same due to concern for side effects  Again re-rx'd prava 02/2013 and reports improved compliance - recheck now Reminded importance of diet and exercise + omega 3 and Co Q 10

## 2013-06-17 NOTE — Progress Notes (Signed)
Subjective:    Patient ID: [name], female    DOB: 10/15/1945, 68 y.o.   MRN: [id number]  HPI  Patient is here for follow up  Reviewed chronic medical issues and interval medical events - hypertension, lipids and diet controlled DM2  Past Medical History  Diagnosis Date  . ALLERGIC RHINITIS   . ANEMIA-NOS   . ASTHMA   . Carpal tunnel syndrome   . COLONIC POLYPS, HX OF   . DIABETES MELLITUS, TYPE II     diet controlled  . GERD   . HYPERLIPIDEMIA   . HYPERTENSION   . OSTEOPENIA   . Rectal fissure   . Psoriasis     began soriatane 01/2012  . Scoliosis     Review of Systems  Constitutional: Positive for fatigue. Negative for fever.  Respiratory: Negative for cough and shortness of breath.   Cardiovascular: Positive for chest pain (intermittent, "like anxiety"). Negative for leg swelling.  Neurological: Negative for weakness and headaches.       Objective:   Physical Exam  BP 148/102  Pulse 74  Temp(Src) 97.1 F (36.2 C) (Oral)  Wt 140 lb 3.2 oz (63.594 kg)  SpO2 96% Wt Readings from Last 3 Encounters:  06/17/13 140 lb 3.2 oz (63.594 kg)  03/04/13 139 lb 6.4 oz (63.231 kg)  12/28/12 138 lb (62.596 kg)   Constitutional: She appears well-developed and well-nourished. No distress.  Neck: Normal range of motion. Neck supple. No JVD present. No thyromegaly present.  Cardiovascular: Normal rate, regular rhythm and normal heart sounds.  No murmur heard. No BLE edema. Pulmonary/Chest: Effort normal and breath sounds normal. No respiratory distress. She has no wheezes.  Psychiatric: She has a normal mood and affect. Her behavior is normal. Judgment and thought content normal.   Lab Results  Component Value Date   WBC 8.6 12/28/2012   HGB 13.3 12/28/2012   HCT 39.5 12/28/2012   PLT 304.0 12/28/2012   GLUCOSE 113* 03/04/2013   CHOL 291* 03/04/2013   TRIG 174.0* 03/04/2013   HDL 56.50 03/04/2013   LDLDIRECT 203.8 03/04/2013   LDLCALC 113* 06/03/2008   ALT 27  12/28/2012   AST 21 12/28/2012   NA 141 03/04/2013   K 4.3 03/04/2013   CL 105 03/04/2013   CREATININE 0.9 03/04/2013   BUN 14 03/04/2013   CO2 28 03/04/2013   TSH 1.58 04/18/2011   HGBA1C 6.4 03/04/2013    Mm Digital Screening  02/15/2013   CLINICAL DATA:  Screening.  EXAM: DIGITAL SCREENING BILATERAL MAMMOGRAM WITH CAD  DIGITAL BREAST TOMOSYNTHESIS  Digital breast tomosynthesis images are acquired in two projections. These images are reviewed in combination with the digital mammogram, confirming the findings below.  COMPARISON:  Previous exam(s).  ACR Breast Density Category c: The breasts are heterogeneously dense, which may obscure small masses.  FINDINGS: There are no findings suspicious for malignancy. Images were processed with CAD.  IMPRESSION: No mammographic evidence of malignancy. A result letter of this screening mammogram will be mailed directly to the patient.  RECOMMENDATION: Screening mammogram in one year. (Code:[id number]-B-01Y)  BI-RADS CATEGORY  1: Negative   Electronically Signed   By: Shon Hale M.D.   On: 02/15/2013 13:25       Assessment & Plan:   Atypical chest pain - resting only, intermittent, but ongoing >6 mo No symptoms present at this time -  Refer for ETT given cardiac risk factor for CAD  Problem List Items Addressed This Visit  DIABETES MELLITUS, TYPE II      Lab Results  Component Value Date   HGBA1C 6.4 03/04/2013   Diet controlled by history - check A1c today to monitor same    Relevant Orders      Hemoglobin A1c      Exercise Tolerance Test   HYPERLIPIDEMIA     on omega-3 + variable compliance with statin therapy as prescribed since 2010- represcribed statin therapy 12/2010, but refused same due to concern for side effects  Again re-rx'd prava 02/2013 and reports improved compliance - recheck now Reminded importance of diet and exercise + omega 3 and Co Q 10    Relevant Orders      Lipid panel      Exercise Tolerance Test   HYPERTENSION - Primary        Uncontrolled today, but reviewed home BP log from past week demonstrating SBP 102-140  Reviewed variable compliance with prescribed therapy Rx'd increased to max dose Benicar 40mg  qd 12/2012 but intolerant of same Continue 20mg  daily and work on life style control as able encourage followup to review adequate control of blood pressure and titration of meds as needed BP Readings from Last 3 Encounters:  06/17/13 148/102  03/04/13 142/98  12/28/12 160/102      Relevant Orders      Exercise Tolerance Test    Other Visit Diagnoses   Atypical chest pain        Relevant Orders       Exercise Tolerance Test

## 2013-06-17 NOTE — Progress Notes (Signed)
Pre visit review using our clinic review tool, if applicable. No additional management support is needed unless otherwise documented below in the visit note. 

## 2013-07-29 ENCOUNTER — Encounter: Payer: Self-pay | Admitting: Internal Medicine

## 2013-07-29 ENCOUNTER — Ambulatory Visit (INDEPENDENT_AMBULATORY_CARE_PROVIDER_SITE_OTHER): Payer: Medicare Other | Admitting: Internal Medicine

## 2013-07-29 VITALS — BP 132/90 | HR 78 | Temp 98.7°F | Wt 140.8 lb

## 2013-07-29 DIAGNOSIS — L409 Psoriasis, unspecified: Secondary | ICD-10-CM

## 2013-07-29 DIAGNOSIS — B370 Candidal stomatitis: Secondary | ICD-10-CM

## 2013-07-29 DIAGNOSIS — R05 Cough: Secondary | ICD-10-CM

## 2013-07-29 DIAGNOSIS — J029 Acute pharyngitis, unspecified: Secondary | ICD-10-CM

## 2013-07-29 DIAGNOSIS — R059 Cough, unspecified: Secondary | ICD-10-CM

## 2013-07-29 DIAGNOSIS — L408 Other psoriasis: Secondary | ICD-10-CM

## 2013-07-29 MED ORDER — LEVOFLOXACIN 500 MG PO TABS: 500.0000 mg | ORAL_TABLET | Freq: Every day | ORAL | Status: DC

## 2013-07-29 MED ORDER — NYSTATIN 100000 UNIT/ML MT SUSP: 5.0000 mL | Freq: Four times a day (QID) | OROMUCOSAL | Status: DC

## 2013-07-29 NOTE — Progress Notes (Signed)
Subjective:    Patient ID: [name], female    DOB: 1946/01/17, 68 y.o.   MRN: [id number]  Cough This is a new problem. The current episode started in the past 7 days. The problem has been gradually worsening. The problem occurs every few minutes. The cough is productive of sputum. Associated symptoms include chills, a fever and a sore throat. Pertinent negatives include no chest pain, headaches, hemoptysis, nasal congestion, rash, shortness of breath, weight loss or wheezing. The symptoms are aggravated by dust and lying down. She has tried OTC cough suppressant for the symptoms. The treatment provided no relief.  Fever  This is a new problem. The current episode started yesterday. The problem occurs daily. The problem has been waxing and waning. The maximum temperature noted was 101 to 101.9 F. The temperature was taken using an oral thermometer. Associated symptoms include coughing and a sore throat. Pertinent negatives include no chest pain, diarrhea, headaches, nausea, rash, sleepiness, urinary pain or wheezing. She has tried NSAIDs for the symptoms. The treatment provided moderate relief.    Also reviewed chronic medical issues and interval medical events  Past Medical History  Diagnosis Date  . ALLERGIC RHINITIS   . ANEMIA-NOS   . ASTHMA   . Carpal tunnel syndrome   . COLONIC POLYPS, HX OF   . DIABETES MELLITUS, TYPE II     diet controlled  . GERD   . HYPERLIPIDEMIA   . HYPERTENSION   . OSTEOPENIA   . Rectal fissure   . Psoriasis     began soriatane 01/2012  . Scoliosis     Review of Systems  Constitutional: Positive for fever and chills. Negative for weight loss.  HENT: Positive for sore throat and trouble swallowing.        Recurrent thrush in past 72h, unresponsive to Diflucan  Respiratory: Positive for cough. Negative for hemoptysis, shortness of breath and wheezing.   Cardiovascular: Negative for chest pain and leg swelling.  Gastrointestinal: Negative for  nausea and diarrhea.  Skin: Negative for rash.  Neurological: Negative for headaches.       Objective:   Physical Exam  BP 132/90  Pulse 78  Temp(Src) 98.7 F (37.1 C) (Oral)  Wt 140 lb 12.8 oz (63.866 kg)  SpO2 96% Wt Readings from Last 3 Encounters:  07/29/13 140 lb 12.8 oz (63.866 kg)  06/17/13 140 lb 3.2 oz (63.594 kg)  03/04/13 139 lb 6.4 oz (63.231 kg)   Constitutional: She is hoarse, coughing but nontoxic - appears well-developed and well-nourished.  HENT: Sinuses nontender, OP with erythema, no specific exudate or postnasal drip. TMs clear bilaterally Neck: Normal range of motion. Neck supple. No LAD or JVD present. No thyromegaly present.  Cardiovascular: Normal rate, regular rhythm and normal heart sounds.  No murmur heard. No BLE edema. Pulmonary/Chest: Effort normal and breath sounds with scattered rhonchi. No respiratory distress. She has no wheezes.  Psychiatric: She has a normal mood and affect. Her behavior is normal. Judgment and thought content normal.   Lab Results  Component Value Date   WBC 8.6 12/28/2012   HGB 13.3 12/28/2012   HCT 39.5 12/28/2012   PLT 304.0 12/28/2012   GLUCOSE 113* 03/04/2013   CHOL 230* 06/17/2013   TRIG 226.0* 06/17/2013   HDL 46.40 06/17/2013   LDLDIRECT 203.8 03/04/2013   LDLCALC 138* 06/17/2013   ALT 27 12/28/2012   AST 21 12/28/2012   NA 141 03/04/2013   K 4.3 03/04/2013   CL 105  03/04/2013   CREATININE 0.9 03/04/2013   BUN 14 03/04/2013   CO2 28 03/04/2013   TSH 1.58 04/18/2011   HGBA1C 6.1 06/17/2013    Mm Digital Screening  02/15/2013   CLINICAL DATA:  Screening.  EXAM: DIGITAL SCREENING BILATERAL MAMMOGRAM WITH CAD  DIGITAL BREAST TOMOSYNTHESIS  Digital breast tomosynthesis images are acquired in two projections. These images are reviewed in combination with the digital mammogram, confirming the findings below.  COMPARISON:  Previous exam(s).  ACR Breast Density Category c: The breasts are heterogeneously dense, which may obscure  small masses.  FINDINGS: There are no findings suspicious for malignancy. Images were processed with CAD.  IMPRESSION: No mammographic evidence of malignancy. A result letter of this screening mammogram will be mailed directly to the patient.  RECOMMENDATION: Screening mammogram in one year. (Code:SM-B-01Y)  BI-RADS CATEGORY  1: Negative   Electronically Signed   By: Shon Hale M.D.   On: 02/15/2013 13:25       Assessment & Plan:   Acute pharyngitis Fever Immunosuppression Oral candidiasis   Will treat for bacterial disease with Levaquin x7 days Also oral nystatin as requested in place of systemic Diflucan Continue Advil with Tylenol for fever and symptom control Patient understands to call in next 72 hours if symptoms unimproved or worse for further evaluation to include chest x-ray or labs as needed, but doing so today would not change planned course of treatment  Problem List Items Addressed This Visit   Psoriasis     Following at [facility] since summer 2014 Changed from Faxon to Enbrel 09/2012 - skeletal symptoms improved but concerned about side effects -  Given immunosuppression related to Enbrel, treat aggressively for potential infectious etiology of pharyngitis and cough symptoms ( see above)     Other Visit Diagnoses   Acute pharyngitis    -  Primary    Cough        Oral candidiasis        Relevant Medications       nystatin (MYCOSTATIN) 100000 UNIT/ML suspension

## 2013-07-29 NOTE — Progress Notes (Signed)
Pre visit review using our clinic review tool, if applicable. No additional management support is needed unless otherwise documented below in the visit note. 

## 2013-07-29 NOTE — Assessment & Plan Note (Signed)
Following at [facility] since summer 2014 Changed from [place] to Enbrel 09/2012 - skeletal symptoms improved but concerned about side effects -  Given immunosuppression related to Enbrel, treat aggressively for potential infectious etiology of pharyngitis and cough symptoms ( see above)

## 2013-07-29 NOTE — Patient Instructions (Addendum)
It was good to see you today.  Levaquin antibiotics and prescription mouthwash for yeast - Your prescription(s) have been submitted to your pharmacy. Please take as directed and contact our office if you believe you are having problem(s) with the medication(s).  Alternate between ibuprofen and tylenol for aches, pain and fever symptoms as discussed  Hydrate, rest and call if worse or unimproved  Pharyngitis Pharyngitis is redness, pain, and swelling (inflammation) of your pharynx.  CAUSES  Pharyngitis is usually caused by infection. Most of the time, these infections are from viruses (viral) and are part of a cold. However, sometimes pharyngitis is caused by bacteria (bacterial). Pharyngitis can also be caused by allergies. Viral pharyngitis may be spread from person to person by coughing, sneezing, and personal items or utensils (cups, forks, spoons, toothbrushes). Bacterial pharyngitis may be spread from person to person by more intimate contact, such as kissing.  SIGNS AND SYMPTOMS  Symptoms of pharyngitis include:   Sore throat.   Tiredness (fatigue).   Low-grade fever.   Headache.  Joint pain and muscle aches.  Skin rashes.  Swollen lymph nodes.  Plaque-like film on throat or tonsils (often seen with bacterial pharyngitis). DIAGNOSIS  Your health care provider will ask you questions about your illness and your symptoms. Your medical history, along with a physical exam, is often all that is needed to diagnose pharyngitis. Sometimes, a rapid strep test is done. Other lab tests may also be done, depending on the suspected cause.  TREATMENT  Viral pharyngitis will usually get better in 3 4 days without the use of medicine. Bacterial pharyngitis is treated with medicines that kill germs (antibiotics).  HOME CARE INSTRUCTIONS   Drink enough water and fluids to keep your urine clear or pale yellow.   Only take over-the-counter or prescription medicines as directed by your  health care provider:   If you are prescribed antibiotics, make sure you finish them even if you start to feel better.   Do not take aspirin.   Get lots of rest.   Gargle with 8 oz of salt water ( tsp of salt per 1 qt of water) as often as every 1 2 hours to soothe your throat.   Throat lozenges (if you are not at risk for choking) or sprays may be used to soothe your throat. SEEK MEDICAL CARE IF:   You have large, tender lumps in your neck.  You have a rash.  You cough up green, yellow-brown, or bloody spit. SEEK IMMEDIATE MEDICAL CARE IF:   Your neck becomes stiff.  You drool or are unable to swallow liquids.  You vomit or are unable to keep medicines or liquids down.  You have severe pain that does not go away with the use of recommended medicines.  You have trouble breathing (not caused by a stuffy nose). MAKE SURE YOU:   Understand these instructions.  Will watch your condition.  Will get help right away if you are not doing well or get worse. Document Released: 01/31/2005 Document Revised: 11/21/2012 Document Reviewed: 10/08/2012 [facility] Patient Information 2014 [place].

## 2013-08-20 ENCOUNTER — Telehealth: Payer: Self-pay

## 2013-08-20 DIAGNOSIS — E119 Type 2 diabetes mellitus without complications: Secondary | ICD-10-CM

## 2013-08-20 NOTE — Telephone Encounter (Signed)
Diabetic bundle-a1c and bmet ordered

## 2013-09-02 ENCOUNTER — Ambulatory Visit: Payer: Medicare Other | Admitting: Internal Medicine

## 2013-10-22 ENCOUNTER — Ambulatory Visit (INDEPENDENT_AMBULATORY_CARE_PROVIDER_SITE_OTHER): Payer: Medicare Other | Admitting: Internal Medicine

## 2013-10-22 ENCOUNTER — Other Ambulatory Visit (INDEPENDENT_AMBULATORY_CARE_PROVIDER_SITE_OTHER): Payer: Medicare Other

## 2013-10-22 ENCOUNTER — Encounter: Payer: Self-pay | Admitting: Internal Medicine

## 2013-10-22 VITALS — BP 132/80 | HR 70 | Temp 98.3°F | Ht 62.0 in | Wt 139.0 lb

## 2013-10-22 DIAGNOSIS — J029 Acute pharyngitis, unspecified: Secondary | ICD-10-CM

## 2013-10-22 DIAGNOSIS — E785 Hyperlipidemia, unspecified: Secondary | ICD-10-CM

## 2013-10-22 DIAGNOSIS — E119 Type 2 diabetes mellitus without complications: Secondary | ICD-10-CM

## 2013-10-22 DIAGNOSIS — Z23 Encounter for immunization: Secondary | ICD-10-CM

## 2013-10-22 DIAGNOSIS — I10 Essential (primary) hypertension: Secondary | ICD-10-CM

## 2013-10-22 LAB — BASIC METABOLIC PANEL
BUN: 14 mg/dL (ref 6–23)
CO2: 30 mEq/L (ref 19–32)
Calcium: 10.2 mg/dL (ref 8.4–10.5)
Chloride: 102 mEq/L (ref 96–112)
Creatinine, Ser: 0.9 mg/dL (ref 0.4–1.2)
GFR: 64.96 mL/min (ref >60.00)
Glucose, Bld: 106 mg/dL — ABNORMAL HIGH (ref 70–99)
Potassium: 4 mEq/L (ref 3.5–5.1)
Sodium: 141 mEq/L (ref 135–145)

## 2013-10-22 LAB — LIPID PANEL
Cholesterol: 231 mg/dL — ABNORMAL HIGH (ref 0–200)
HDL: 55 mg/dL (ref >39.00)
NonHDL: 176
Total CHOL/HDL Ratio: 4
Triglycerides: 231 mg/dL — ABNORMAL HIGH (ref 0.0–149.0)
VLDL: 46.2 mg/dL — ABNORMAL HIGH (ref 0.0–40.0)

## 2013-10-22 LAB — HEMOGLOBIN A1C: Hgb A1c MFr Bld: 6.3 % (ref 4.6–6.5)

## 2013-10-22 LAB — LDL CHOLESTEROL, DIRECT: Direct LDL: 148.8 mg/dL

## 2013-10-22 MED ORDER — AZITHROMYCIN 250 MG PO TABS: ORAL_TABLET | ORAL | Status: DC

## 2013-10-22 NOTE — Assessment & Plan Note (Signed)
Episodically aggrevated by anxiety and "white cot" reviewed home BP log from past week demonstrating SBP 102-140  Reviewed variable compliance with prescribed therapy Rx'd increased to max dose Benicar 40mg  qd 12/2012 but intolerant of same Continue 20mg  daily and work on life style control as able encourage followup to review adequate control of blood pressure and titration of meds as needed BP Readings from Last 3 Encounters:  10/22/13 132/80  07/29/13 132/90  06/17/13 148/102

## 2013-10-22 NOTE — Progress Notes (Signed)
Subjective:    Patient ID: [name], female    DOB: August 18, 1945, 68 y.o.   MRN: [id number]  HPI  Patient is here for follow up  Reviewed chronic medical issues and interval medical events  Past Medical History  Diagnosis Date  . ALLERGIC RHINITIS   . ANEMIA-NOS   . ASTHMA   . Carpal tunnel syndrome   . COLONIC POLYPS, HX OF   . DIABETES MELLITUS, TYPE II     diet controlled  . GERD   . HYPERLIPIDEMIA   . HYPERTENSION   . OSTEOPENIA   . Rectal fissure   . Psoriasis     began soriatane 01/2012  . Scoliosis     Review of Systems  Constitutional: Negative for fever and fatigue.  HENT: Positive for sinus pressure (chronic w/o change) and sore throat (scratchy x 24h). Negative for ear pain, facial swelling, postnasal drip and voice change.   Respiratory: Negative for cough and shortness of breath.   Cardiovascular: Negative for chest pain, palpitations and leg swelling.  Skin: Positive for rash. Negative for wound.       Objective:   Physical Exam  BP 132/80  Pulse 70  Temp(Src) 98.3 F (36.8 C) (Oral)  Ht 5\' 2"  (1.575 m)  Wt 139 lb (63.05 kg)  BMI 25.42 kg/m2  SpO2 96% Wt Readings from Last 3 Encounters:  10/22/13 139 lb (63.05 kg)  07/29/13 140 lb 12.8 oz (63.866 kg)  06/17/13 140 lb 3.2 oz (63.594 kg)    Constitutional: She appears well-developed and well-nourished. No distress.  HENT: NCAT, no sinus tenderness to palpation, OP with min erythema, mild shoddy cervical anterior LAD R>L Neck: Normal range of motion. Neck supple. No JVD present. No thyromegaly present.  Cardiovascular: Normal rate, regular rhythm and normal heart sounds.  No murmur heard. No BLE edema. Pulmonary/Chest: Effort normal and breath sounds normal. No respiratory distress. She has no wheezes.  Psychiatric: She has a normal mood and affect. Her behavior is normal. Judgment and thought content normal.   Lab Results  Component Value Date   WBC 8.6 12/28/2012   HGB 13.3  12/28/2012   HCT 39.5 12/28/2012   PLT 304.0 12/28/2012   GLUCOSE 113* 03/04/2013   CHOL 230* 06/17/2013   TRIG 226.0* 06/17/2013   HDL 46.40 06/17/2013   LDLDIRECT 203.8 03/04/2013   LDLCALC 138* 06/17/2013   ALT 27 12/28/2012   AST 21 12/28/2012   NA 141 03/04/2013   K 4.3 03/04/2013   CL 105 03/04/2013   CREATININE 0.9 03/04/2013   BUN 14 03/04/2013   CO2 28 03/04/2013   TSH 1.58 04/18/2011   HGBA1C 6.1 06/17/2013    Mm Digital Screening  02/15/2013   CLINICAL DATA:  Screening.  EXAM: DIGITAL SCREENING BILATERAL MAMMOGRAM WITH CAD  DIGITAL BREAST TOMOSYNTHESIS  Digital breast tomosynthesis images are acquired in two projections. These images are reviewed in combination with the digital mammogram, confirming the findings below.  COMPARISON:  Previous exam(s).  ACR Breast Density Category c: The breasts are heterogeneously dense, which may obscure small masses.  FINDINGS: There are no findings suspicious for malignancy. Images were processed with CAD.  IMPRESSION: No mammographic evidence of malignancy. A result letter of this screening mammogram will be mailed directly to the patient.  RECOMMENDATION: Screening mammogram in one year. (Code:SM-B-01Y)  BI-RADS CATEGORY  1: Negative   Electronically Signed   By: Shon Hale M.D.   On: 02/15/2013 13:25  Assessment & Plan:   sore throat - ?viral - ok for immunizations today - printed Zpack to take if symptoms worse  Problem List Items Addressed This Visit   DIABETES MELLITUS, TYPE II - Primary      Lab Results  Component Value Date   HGBA1C 6.1 06/17/2013   Diet controlled by history Reports recent "binge eating" due to faulty strips showing "low cbg", but now new meter and normalized cbg/eating habits check A1c today to monitor same    Relevant Orders      Hemoglobin A1c      Basic metabolic panel      Lipid panel   HYPERLIPIDEMIA     on omega-3 + variable compliance with statin therapy as prescribed since 2010- represcribed statin therapy  12/2010, but intermittent same due to concern for side effects  Again re-rx'd prava 02/2013 and reports improved compliance - recheck now Reminded importance of diet and exercise + omega 3 and Co Q 10    HYPERTENSION      Episodically aggrevated by anxiety and "white cot" reviewed home BP log from past week demonstrating SBP 102-140  Reviewed variable compliance with prescribed therapy Rx'd increased to max dose Benicar 40mg  qd 12/2012 but intolerant of same Continue 20mg  daily and work on life style control as able encourage followup to review adequate control of blood pressure and titration of meds as needed BP Readings from Last 3 Encounters:  10/22/13 132/80  07/29/13 132/90  06/17/13 148/102       Other Visit Diagnoses   Need for prophylactic vaccination against Streptococcus pneumoniae (pneumococcus)        Relevant Orders       Pneumococcal conjugate vaccine 13-valent    Sore throat

## 2013-10-22 NOTE — Assessment & Plan Note (Signed)
on omega-3 + variable compliance with statin therapy as prescribed since 2010- represcribed statin therapy 12/2010, but intermittent same due to concern for side effects  Again re-rx'd prava 02/2013 and reports improved compliance - recheck now Reminded importance of diet and exercise + omega 3 and Co Q 10

## 2013-10-22 NOTE — Progress Notes (Signed)
Pre visit review using our clinic review tool, if applicable. No additional management support is needed unless otherwise documented below in the visit note. 

## 2013-10-22 NOTE — Assessment & Plan Note (Signed)
Lab Results  Component Value Date   HGBA1C 6.1 06/17/2013   Diet controlled by history Reports recent "binge eating" due to faulty strips showing "low cbg", but now new meter and normalized cbg/eating habits check A1c today to monitor same

## 2013-10-22 NOTE — Patient Instructions (Addendum)
It was good to see you today.  We have reviewed your prior records including labs and tests today  Your annual flu shot and Prevnar immunization was given and/or updated today.  Test(s) ordered today. Your results will be released to [place] (or called to you) after review, usually within 72hours after test completion. If any changes need to be made, you will be notified at that same time.  Medications reviewed and updated, no changes recommended at this time. Printed prescription of Zpack given to you to take as needed Refill on medication(s) as discussed today.  Please schedule followup in 6 months for semiannual check and labs, call sooner if problems.  Diabetes and Standards of Medical Care Diabetes is complicated. You may find that your diabetes team includes a dietitian, nurse, diabetes educator, eye doctor, and more. To help everyone know what is going on and to help you get the care you deserve, the following schedule of care was developed to help keep you on track. Below are the tests, exams, vaccines, medicines, education, and plans you will need. HbA1c test This test shows how well you have controlled your glucose over the past 2-3 months. It is used to see if your diabetes management plan needs to be adjusted.   It is performed at least 2 times a year if you are meeting treatment goals.  It is performed 4 times a year if therapy has changed or if you are not meeting treatment goals. Blood pressure test  This test is performed at every routine medical visit. The goal is less than 140/90 mm Hg for most people, but 130/80 mm Hg in some cases. Ask your health care provider about your goal. Dental exam  Follow up with the dentist regularly. Eye exam  If you are diagnosed with type 1 diabetes as a child, get an exam upon reaching the age of 55 years or older and have had diabetes for 3-5 years. Yearly eye exams are recommended after that initial eye exam.  If you are diagnosed  with type 1 diabetes as an adult, get an exam within 5 years of diagnosis and then yearly.  If you are diagnosed with type 2 diabetes, get an exam as soon as possible after the diagnosis and then yearly. Foot care exam  Visual foot exams are performed at every routine medical visit. The exams check for cuts, injuries, or other problems with the feet.  A comprehensive foot exam should be done yearly. This includes visual inspection as well as assessing foot pulses and testing for loss of sensation.  Check your feet nightly for cuts, injuries, or other problems with your feet. Tell your health care provider if anything is not healing. Kidney function test (urine microalbumin)  This test is performed once a year.  Type 1 diabetes: The first test is performed 5 years after diagnosis.  Type 2 diabetes: The first test is performed at the time of diagnosis.  A serum creatinine and estimated glomerular filtration rate (eGFR) test is done once a year to assess the level of chronic kidney disease (CKD), if present. Lipid profile (cholesterol, HDL, LDL, triglycerides)  Performed every 5 years for most people.  The goal for LDL is less than 100 mg/dL. If you are at high risk, the goal is less than 70 mg/dL.  The goal for HDL is 40 mg/dL-50 mg/dL for men and 50 mg/dL-60 mg/dL for women. An HDL cholesterol of 60 mg/dL or higher gives some protection against heart disease.  The goal for triglycerides is less than 150 mg/dL. Influenza vaccine, pneumococcal vaccine, and hepatitis B vaccine  The influenza vaccine is recommended yearly.  It is recommended that people with diabetes who are over 59 years old get the pneumonia vaccine. In some cases, two separate shots may be given. Ask your health care provider if your pneumonia vaccination is up to date.  The hepatitis B vaccine is also recommended for adults with diabetes. Diabetes self-management education  Education is recommended at diagnosis  and ongoing as needed. Treatment plan  Your treatment plan is reviewed at every medical visit. Document Released: 11/28/2008 Document Revised: 06/17/2013 Document Reviewed: 07/03/2012 [facility] Patient Information 2015 [place], [place]. This information is not intended to replace advice given to you by your health care provider. Make sure you discuss any questions you have with your health care provider.

## 2014-02-03 ENCOUNTER — Other Ambulatory Visit: Payer: Self-pay | Admitting: Internal Medicine

## 2014-03-21 ENCOUNTER — Other Ambulatory Visit: Payer: Self-pay

## 2014-03-21 DIAGNOSIS — Z1231 Encounter for screening mammogram for malignant neoplasm of breast: Secondary | ICD-10-CM

## 2014-04-02 ENCOUNTER — Encounter (INDEPENDENT_AMBULATORY_CARE_PROVIDER_SITE_OTHER): Payer: Self-pay

## 2014-04-02 ENCOUNTER — Ambulatory Visit
Admission: RE | Admit: 2014-04-02 | Discharge: 2014-04-02 | Disposition: A | Discharge status: Home / Self Care | Payer: Medicare Other | Source: Ambulatory Visit

## 2014-04-02 ENCOUNTER — Other Ambulatory Visit: Payer: Self-pay | Admitting: Internal Medicine

## 2014-04-02 DIAGNOSIS — R928 Other abnormal and inconclusive findings on diagnostic imaging of breast: Secondary | ICD-10-CM

## 2014-04-02 DIAGNOSIS — Z1231 Encounter for screening mammogram for malignant neoplasm of breast: Secondary | ICD-10-CM

## 2014-04-09 ENCOUNTER — Other Ambulatory Visit: Payer: Self-pay | Admitting: Internal Medicine

## 2014-04-09 ENCOUNTER — Ambulatory Visit
Admission: RE | Admit: 2014-04-09 | Discharge: 2014-04-09 | Disposition: A | Discharge status: Home / Self Care | Payer: Medicare Other | Source: Ambulatory Visit | Attending: Internal Medicine | Admitting: Internal Medicine

## 2014-04-09 DIAGNOSIS — R928 Other abnormal and inconclusive findings on diagnostic imaging of breast: Secondary | ICD-10-CM

## 2014-04-09 DIAGNOSIS — N6310 Unspecified lump in the right breast, unspecified quadrant: Secondary | ICD-10-CM

## 2014-04-16 ENCOUNTER — Encounter: Payer: Medicare Other | Admitting: Internal Medicine

## 2014-04-21 ENCOUNTER — Other Ambulatory Visit: Payer: Self-pay | Admitting: Internal Medicine

## 2014-04-22 ENCOUNTER — Other Ambulatory Visit: Payer: Self-pay | Admitting: Internal Medicine

## 2014-04-22 DIAGNOSIS — N6310 Unspecified lump in the right breast, unspecified quadrant: Secondary | ICD-10-CM

## 2014-04-23 ENCOUNTER — Ambulatory Visit
Admission: RE | Admit: 2014-04-23 | Discharge: 2014-04-23 | Disposition: A | Discharge status: Home / Self Care | Payer: Medicare Other | Source: Ambulatory Visit | Attending: Internal Medicine | Admitting: Internal Medicine

## 2014-04-23 ENCOUNTER — Other Ambulatory Visit: Payer: Self-pay | Admitting: Internal Medicine

## 2014-04-23 DIAGNOSIS — N6310 Unspecified lump in the right breast, unspecified quadrant: Secondary | ICD-10-CM

## 2014-06-03 ENCOUNTER — Encounter: Payer: Self-pay | Admitting: Internal Medicine

## 2014-06-03 ENCOUNTER — Other Ambulatory Visit (INDEPENDENT_AMBULATORY_CARE_PROVIDER_SITE_OTHER): Payer: Medicare Other

## 2014-06-03 ENCOUNTER — Ambulatory Visit (INDEPENDENT_AMBULATORY_CARE_PROVIDER_SITE_OTHER): Payer: Medicare Other | Admitting: Internal Medicine

## 2014-06-03 ENCOUNTER — Ambulatory Visit (INDEPENDENT_AMBULATORY_CARE_PROVIDER_SITE_OTHER)
Admission: RE | Admit: 2014-06-03 | Discharge: 2014-06-03 | Disposition: A | Discharge status: Home / Self Care | Payer: Medicare Other | Source: Ambulatory Visit | Attending: Internal Medicine | Admitting: Internal Medicine

## 2014-06-03 VITALS — BP 166/94 | HR 80 | Temp 97.6°F | Ht 62.0 in | Wt 138.0 lb

## 2014-06-03 DIAGNOSIS — E785 Hyperlipidemia, unspecified: Secondary | ICD-10-CM | POA: Diagnosis not present

## 2014-06-03 DIAGNOSIS — R1032 Left lower quadrant pain: Secondary | ICD-10-CM

## 2014-06-03 DIAGNOSIS — E119 Type 2 diabetes mellitus without complications: Secondary | ICD-10-CM | POA: Diagnosis not present

## 2014-06-03 DIAGNOSIS — Z23 Encounter for immunization: Secondary | ICD-10-CM | POA: Diagnosis not present

## 2014-06-03 DIAGNOSIS — M5410 Radiculopathy, site unspecified: Secondary | ICD-10-CM

## 2014-06-03 DIAGNOSIS — M419 Scoliosis, unspecified: Secondary | ICD-10-CM

## 2014-06-03 DIAGNOSIS — Z0000 Encounter for general adult medical examination without abnormal findings: Secondary | ICD-10-CM | POA: Diagnosis not present

## 2014-06-03 LAB — HEMOGLOBIN A1C: Hgb A1c MFr Bld: 6.1 % (ref 4.6–6.5)

## 2014-06-03 LAB — BASIC METABOLIC PANEL
BUN: 15 mg/dL (ref 6–23)
CO2: 29 mEq/L (ref 19–32)
Calcium: 10 mg/dL (ref 8.4–10.5)
Chloride: 103 mEq/L (ref 96–112)
Creatinine, Ser: 0.86 mg/dL (ref 0.40–1.20)
GFR: 69.21 mL/min (ref >60.00)
Glucose, Bld: 111 mg/dL — ABNORMAL HIGH (ref 70–99)
Potassium: 4.1 mEq/L (ref 3.5–5.1)
Sodium: 138 mEq/L (ref 135–145)

## 2014-06-03 LAB — MICROALBUMIN / CREATININE URINE RATIO
Creatinine,U: 25 mg/dL
Microalb Creat Ratio: 2.8 mg/g (ref 0.0–30.0)
Microalb, Ur: <0.7 mg/dL (ref 0.0–1.9)

## 2014-06-03 MED ORDER — DOXYCYCLINE HYCLATE 100 MG PO TABS: 100.0000 mg | ORAL_TABLET | Freq: Two times a day (BID) | ORAL | Status: DC

## 2014-06-03 NOTE — Progress Notes (Signed)
Pre visit review using our clinic review tool, if applicable. No additional management support is needed unless otherwise documented below in the visit note. 

## 2014-06-03 NOTE — Assessment & Plan Note (Signed)
Intermittent back pain symptoms - Worse in past 4-42mo predominiately left side with radiation around into groin Prev symptoms improved after starting Soriatane 01/2012 but has since stopped all oral antiimmunologic meds Chronic scoli with new radiculopathy Check DG L-spine and MRI Refer to back specialists for "updated options" on same, though reluctant to consider surg if needed

## 2014-06-03 NOTE — Patient Instructions (Signed)
It was good to see you today.  We have reviewed your prior records including labs and tests today  Health Maintenance reviewed - tetanus updated today.  all other recommended immunizations and age-appropriate screenings are up-to-date.  Test(s) ordered today. Your results will be released to [name] (or called to you) after review, usually within 72hours after test completion. If any changes need to be made, you will be notified at that same time.  Medications reviewed and updated,  Doxycycline course as needed as discussed. No other changes recommended at this time.  Will refer for MRI and gastroenterology evaluation of pain symptoms as discussed My office will call with these appointments once made  Please schedule followup in 6 months for semiannual exam and diabetes labs, call sooner if problems.

## 2014-06-03 NOTE — Progress Notes (Signed)
Subjective:    Patient ID: [name], female    DOB: 07/14/1945, 69 y.o.   MRN: [id number]  HPI   Here for medicare wellness  Diet: heart healthy with carb mod, diabetic Physical activity: sedentary Depression/mood screen: negative Hearing: intact to whispered voice Visual acuity: grossly normal, performs annual eye exam  ADLs: capable Fall risk: none Home safety: good Cognitive evaluation: intact to orientation, naming, recall and repetition EOL planning: adv directives, full code/ I agree  I have personally reviewed and have noted 1. The patient's medical and social history 2. Their use of alcohol, tobacco or illicit drugs 3. Their current medications and supplements 4. The patient's functional ability including ADL's, fall risks, home safety risks and hearing or visual impairment. 5. Diet and physical activities 6. Evidence for depression or mood disorders  Also reviewed chronic conditions, interval events and current concerns   Past Medical History  Diagnosis Date  . ALLERGIC RHINITIS   . ANEMIA-NOS   . ASTHMA   . Carpal tunnel syndrome   . COLONIC POLYPS, HX OF   . DIABETES MELLITUS, TYPE II     diet controlled  . GERD   . HYPERLIPIDEMIA   . HYPERTENSION   . OSTEOPENIA   . Rectal fissure   . Psoriasis     severe, began soriatane 01/2012  . Scoliosis    Family History  Problem Relation Age of Onset  . Lung cancer Father   . Arthritis Other     Parents  . Asthma Other     parent, other relative  . Breast cancer Other     other relative  . Hypertension Other     parent, other relative  . Heart disease Other     parent, other relative   History  Substance Use Topics  . Smoking status: Former Smoker    Quit date: 02/14/1974  . Smokeless tobacco: Not on file  . Alcohol Use: No    Review of Systems  Constitutional: Negative for fatigue and unexpected weight change.  Respiratory: Negative for cough, shortness of breath and wheezing.     Cardiovascular: Negative for chest pain, palpitations and leg swelling.  Gastrointestinal: Negative for nausea, abdominal pain and diarrhea. Constipation: alt bowels.  Musculoskeletal: Positive for back pain (on/off x 6 mo, better since stopping statin, but not resolved - stop) and arthralgias (L hip/groin pain).  Neurological: Negative for dizziness, weakness, light-headedness and headaches.  Psychiatric/Behavioral: Negative for dysphoric mood. The patient is not nervous/anxious.   All other systems reviewed and are negative.      Objective:    Physical Exam  Constitutional: She appears well-developed and well-nourished. No distress.  Cardiovascular: Normal rate, regular rhythm and normal heart sounds.   No murmur heard. Pulmonary/Chest: Effort normal and breath sounds normal. No respiratory distress.  Abdominal: Soft. Bowel sounds are normal. She exhibits no distension and no mass. There is no tenderness. There is no rebound and no guarding.  Musculoskeletal: She exhibits no edema.  Back: scoliosis with left lumbar shift. Limited  range of motion of thoracic and lumbar spine. Non tender to palpation. Negative straight leg raise. DTR's are symmetrically intact. Sensation intact in all dermatomes of the lower extremities. grossly strength to manual muscle testing. patient is able to heel toe walk without difficulty and ambulates with antalgic gait.   Psychiatric: She has a normal mood and affect. Her behavior is normal. Judgment and thought content normal.    BP 166/94 mmHg  Pulse 80  Temp(Src) 97.6 F (36.4 C) (Oral)  Ht '5\' 2"'  (1.575 m)  Wt 138 lb (62.596 kg)  BMI 25.23 kg/m2  SpO2 99% Wt Readings from Last 3 Encounters:  06/03/14 138 lb (62.596 kg)  10/22/13 139 lb (63.05 kg)  07/29/13 140 lb 12.8 oz (63.866 kg)    Lab Results  Component Value Date   WBC 8.6 12/28/2012   HGB 13.3 12/28/2012   HCT 39.5 12/28/2012   PLT 304.0 12/28/2012   GLUCOSE 106* 10/22/2013   CHOL  231* 10/22/2013   TRIG 231.0* 10/22/2013   HDL 55.00 10/22/2013   LDLDIRECT 148.8 10/22/2013   LDLCALC 138* 06/17/2013   ALT 27 12/28/2012   AST 21 12/28/2012   NA 141 10/22/2013   K 4.0 10/22/2013   CL 102 10/22/2013   CREATININE 0.9 10/22/2013   BUN 14 10/22/2013   CO2 30 10/22/2013   TSH 1.58 04/18/2011   HGBA1C 6.3 10/22/2013    Mm Digital Diagnostic Unilat R  04/23/2014   CLINICAL DATA:  69 year old female status post ultrasound-guided right breast biopsy  EXAM: DIAGNOSTIC RIGHT MAMMOGRAM POST ULTRASOUND BIOPSY  COMPARISON:  Previous exam(s).  FINDINGS: Mammographic images were obtained following ultrasound guided biopsy of indeterminate right breast mass at 10 o'clock, 4 cm from the nipple. Post biopsy images demonstrate a coil clip within the expected position within the upper, outer right breast.  IMPRESSION: Satisfactory placement of a coil clip post ultrasound-guided right breast biopsy.  Final Assessment: Post Procedure Mammograms for Marker Placement   Electronically Signed   By: Pamelia Hoit M.D.   On: 04/23/2014 11:01   [place] Rt Breast Bx W Loc Dev 1st Lesion Img Bx Spec US Guide  04/25/2014   ADDENDUM REPORT: 04/25/2014 16:54  ADDENDUM: PATHOLOGY ADDENDUM:  Pathology: Fibrocystic changes with usual ductal hyperplasia. No evidence of malignancy.  Pathology concordant with imaging findings: Yes  Recommendation: Right diagnostic mammogram and possible ultrasound in 6 months.  Localization/excision considerations: Excision not necessary.  Results were given to the patient by telephone on 04/24/2014 by Susa Raring, RN. She reports doing well after the biopsy other than tenderness at the biopsy site.   Electronically Signed   By: Pamelia Hoit M.D.   On: 04/25/2014 16:54   04/25/2014   CLINICAL DATA:  69 year old female with indeterminate right breast mass at 10 o'clock, 4 cm from the nipple  EXAM: ULTRASOUND GUIDED RIGHT BREAST CORE NEEDLE BIOPSY  COMPARISON:  Previous exam(s).  PROCEDURE: I  met with the patient and we discussed the procedure of ultrasound-guided biopsy, including benefits and alternatives. We discussed the high likelihood of a successful procedure. We discussed the risks of the procedure including infection, bleeding, tissue injury, clip migration, and inadequate sampling. Informed written consent was given. The usual time-out protocol was performed immediately prior to the procedure.  Using sterile technique and 2% Lidocaine as local anesthetic, under direct ultrasound visualization, a 12 gauge vacuum-assisted device was used to perform biopsy of an indeterminate right breast mass at 10 o'clock, 4 cm from the nippleusing a lateral to medial approach. At the conclusion of the procedure, a coil shaped tissue marker clip was deployed into the biopsy cavity. Follow-up 2-view mammogram was performed and dictated separately.  IMPRESSION: Ultrasound-guided biopsy of an indeterminate right breast mass at 10 o'clock. No apparent complications.  Electronically Signed: By: Pamelia Hoit M.D. On: 04/23/2014 11:00       Assessment & Plan:   AWV/z00.00 - Today patient counseled on age appropriate routine health  concerns for screening and prevention, each reviewed and up to date or declined. Immunizations reviewed and up to date or declined. Labs ordered and reviewed. Risk factors for depression reviewed and negative. Hearing function and visual acuity are intact. ADLs screened and addressed as needed. Functional ability and level of safety reviewed and appropriate. Education, counseling and referrals performed based on assessed risks today. Patient provided with a copy of personalized plan for preventive services.  Problem List Items Addressed This Visit    Diabetes type 2, controlled    Lab Results  Component Value Date   HGBA1C 6.3 10/22/2013   Diet controlled by history Reports recent "binge eating" due to mother's illness and hosp/SNF stay check A1c today to monitor sam       Relevant Orders   Hemoglobin J2I   Basic metabolic panel   Microalbumin / creatinine urine ratio   Dyslipidemia    on omega-3 + variable compliance with statin therapy as prescribed since 2010- represcribed statin therapy 12/2010, but intermittent same due to concern for side effects  Again re-rx'd prava 02/2013 and reports improved compliance until back pain problems later fall 2015 Check lipids annually, last reviewed Reminded importance of diet and exercise + omega 3 and Co Q 10      Scoliosis    Intermittent back pain symptoms - Worse in past 4-20mo predominiately left side with radiation around into groin Prev symptoms improved after starting Soriatane 01/2012 but has since stopped all oral antiimmunologic meds Chronic scoli with new radiculopathy Check DG L-spine and MRI Refer to back specialists for "updated options" on same, though reluctant to consider surg if needed        Relevant Orders   DG Lumbar Spine 2-3 Views   MR Lumbar Spine Wo Contrast    Other Visit Diagnoses    Routine general medical examination at a health care facility    -  Primary    Need for prophylactic vaccination with tetanus toxoid alone        Relevant Orders    Td vaccine greater than or equal to 7yo preservative free IM (Completed)    LLQ abdominal pain        Relevant Orders    Ambulatory referral to Gastroenterology    Back pain with left-sided radiculopathy        Relevant Orders    DG Lumbar Spine 2-3 Views    MR Lumbar Spine Wo Contrast        VGwendolyn Grant MD

## 2014-06-03 NOTE — Assessment & Plan Note (Signed)
on omega-3 + variable compliance with statin therapy as prescribed since 2010- represcribed statin therapy 12/2010, but intermittent same due to concern for side effects  Again re-rx'd prava 02/2013 and reports improved compliance until back pain problems later fall 2015 Check lipids annually, last reviewed Reminded importance of diet and exercise + omega 3 and Co Q 10

## 2014-06-03 NOTE — Assessment & Plan Note (Signed)
Lab Results  Component Value Date   HGBA1C 6.3 10/22/2013   Diet controlled by history Reports recent "binge eating" due to mother's illness and hosp/SNF stay check A1c today to monitor sam

## 2014-06-18 ENCOUNTER — Other Ambulatory Visit: Payer: Self-pay | Admitting: Internal Medicine

## 2014-06-28 ENCOUNTER — Ambulatory Visit
Admission: RE | Admit: 2014-06-28 | Discharge: 2014-06-28 | Disposition: A | Discharge status: Home / Self Care | Payer: Medicare Other | Source: Ambulatory Visit | Attending: Internal Medicine | Admitting: Internal Medicine

## 2014-06-28 DIAGNOSIS — M419 Scoliosis, unspecified: Secondary | ICD-10-CM

## 2014-06-28 DIAGNOSIS — M5410 Radiculopathy, site unspecified: Secondary | ICD-10-CM

## 2014-07-03 ENCOUNTER — Other Ambulatory Visit: Payer: Self-pay | Admitting: Internal Medicine

## 2014-07-03 DIAGNOSIS — M5416 Radiculopathy, lumbar region: Secondary | ICD-10-CM

## 2014-10-17 ENCOUNTER — Other Ambulatory Visit: Payer: Self-pay | Admitting: Internal Medicine

## 2014-10-17 DIAGNOSIS — N63 Unspecified lump in unspecified breast: Secondary | ICD-10-CM

## 2014-10-27 ENCOUNTER — Ambulatory Visit
Admission: RE | Admit: 2014-10-27 | Discharge: 2014-10-27 | Disposition: A | Discharge status: Home / Self Care | Payer: Medicare Other | Source: Ambulatory Visit | Attending: Internal Medicine | Admitting: Internal Medicine

## 2014-10-27 DIAGNOSIS — N63 Unspecified lump in unspecified breast: Secondary | ICD-10-CM

## 2014-10-30 LAB — HM DIABETES EYE EXAM

## 2014-11-07 ENCOUNTER — Ambulatory Visit (INDEPENDENT_AMBULATORY_CARE_PROVIDER_SITE_OTHER): Payer: Medicare Other

## 2014-11-07 DIAGNOSIS — Z23 Encounter for immunization: Secondary | ICD-10-CM

## 2014-11-19 ENCOUNTER — Encounter: Payer: Self-pay | Admitting: Internal Medicine

## 2014-12-23 ENCOUNTER — Ambulatory Visit (INDEPENDENT_AMBULATORY_CARE_PROVIDER_SITE_OTHER): Payer: Medicare Other | Admitting: Internal Medicine

## 2014-12-23 ENCOUNTER — Encounter: Payer: Self-pay | Admitting: Internal Medicine

## 2014-12-23 ENCOUNTER — Other Ambulatory Visit (INDEPENDENT_AMBULATORY_CARE_PROVIDER_SITE_OTHER): Payer: Medicare Other

## 2014-12-23 VITALS — BP 128/80 | HR 68 | Temp 97.5°F | Ht 62.0 in | Wt 137.0 lb

## 2014-12-23 DIAGNOSIS — E785 Hyperlipidemia, unspecified: Secondary | ICD-10-CM

## 2014-12-23 DIAGNOSIS — I10 Essential (primary) hypertension: Secondary | ICD-10-CM | POA: Diagnosis not present

## 2014-12-23 DIAGNOSIS — Z23 Encounter for immunization: Secondary | ICD-10-CM | POA: Diagnosis not present

## 2014-12-23 DIAGNOSIS — E1165 Type 2 diabetes mellitus with hyperglycemia: Secondary | ICD-10-CM

## 2014-12-23 DIAGNOSIS — Z5181 Encounter for therapeutic drug level monitoring: Secondary | ICD-10-CM | POA: Diagnosis not present

## 2014-12-23 LAB — HEPATIC FUNCTION PANEL
ALT: 28 U/L (ref 0–35)
AST: 21 U/L (ref 0–37)
Albumin: 4.5 g/dL (ref 3.5–5.2)
Alkaline Phosphatase: 87 U/L (ref 39–117)
Bilirubin, Direct: 0.1 mg/dL (ref 0.0–0.3)
Total Bilirubin: 0.8 mg/dL (ref 0.2–1.2)
Total Protein: 8 g/dL (ref 6.0–8.3)

## 2014-12-23 LAB — MICROALBUMIN / CREATININE URINE RATIO
Creatinine,U: 22 mg/dL
Microalb Creat Ratio: 3.2 mg/g (ref 0.0–30.0)
Microalb, Ur: <0.7 mg/dL (ref 0.0–1.9)

## 2014-12-23 LAB — BASIC METABOLIC PANEL
BUN: 12 mg/dL (ref 6–23)
CO2: 27 mEq/L (ref 19–32)
Calcium: 9.9 mg/dL (ref 8.4–10.5)
Chloride: 103 mEq/L (ref 96–112)
Creatinine, Ser: 0.78 mg/dL (ref 0.40–1.20)
GFR: 77.34 mL/min (ref >60.00)
Glucose, Bld: 102 mg/dL — ABNORMAL HIGH (ref 70–99)
Potassium: 3.6 mEq/L (ref 3.5–5.1)
Sodium: 140 mEq/L (ref 135–145)

## 2014-12-23 LAB — LIPID PANEL
Cholesterol: 285 mg/dL — ABNORMAL HIGH (ref 0–200)
HDL: 48.9 mg/dL (ref >39.00)
NonHDL: 236.21
Total CHOL/HDL Ratio: 6
Triglycerides: 286 mg/dL — ABNORMAL HIGH (ref 0.0–149.0)
VLDL: 57.2 mg/dL — ABNORMAL HIGH (ref 0.0–40.0)

## 2014-12-23 LAB — HEMOGLOBIN A1C: Hgb A1c MFr Bld: 6.1 % (ref 4.6–6.5)

## 2014-12-23 LAB — LDL CHOLESTEROL, DIRECT: Direct LDL: 177 mg/dL

## 2014-12-23 MED ORDER — DIAZEPAM 2 MG PO TABS: 1.0000 mg | ORAL_TABLET | Freq: Two times a day (BID) | ORAL | Status: DC | PRN

## 2014-12-23 NOTE — Patient Instructions (Signed)
It was good to see you today.  We have reviewed your prior records including labs and tests today  Pneumovax updated today  Test(s) ordered today. Your results will be released to [place] (or called to you) after review, usually within 72hours after test completion. If any changes need to be made, you will be notified at that same time.  Medications reviewed and updated: Use generic Valium as discussed prior to endoscopic procedure to control blood pressure in the setting of anxiety -prescription for this has been given to you today to take to your pharmacy when needed   Also okay to try krill oil for triglyceride control in place of fish oil We'll consider resuming lovastatin for cholesterol if needed, but no changes recommended at this time.  Please schedule followup in 4-6 months for your annual visit and to establish care with new primary care provider Dr. Quay Burow, please call sooner if problems.

## 2014-12-23 NOTE — Assessment & Plan Note (Signed)
Episodically aggrevated by anxiety and "white coat" reviewed home BP log from past 3 months Also reviewed variable compliance with prescribed therapy Rx'd increased to max dose Benicar '40mg'$  qd 12/2012 but intolerant of same, prefers to continue '20mg'$  daily and work on life style control as able encourage followup to review adequate control of blood pressure and titration of meds as needed Also provided Limited supply of generic diazepam 2 mg to take prior to GI procedures to help control blood pressure in setting of increased anxiety (reports endo procedure canceled this summer due to uncontrolled blood pressure and has not yet been rescheduled with Dr. Collene Mares) BP Readings from Last 3 Encounters:  12/23/14 128/80  06/03/14 166/94  10/22/13 132/80

## 2014-12-23 NOTE — Progress Notes (Signed)
Subjective:    Patient ID: [name], female    DOB: 1945/08/17, 69 y.o.   MRN: [id number]  HPI  Patient here for follow-up. Reviewed chronic medical issues, interval events and current concerns. Reviewed home blood pressure log taken daily in both arms. No significant change between sides but variable readings systolic pressure ranging from 89-160 over past 3 months  Past Medical History  Diagnosis Date  . ALLERGIC RHINITIS   . ANEMIA-NOS   . ASTHMA   . Carpal tunnel syndrome   . COLONIC POLYPS, HX OF   . DIABETES MELLITUS, TYPE II     diet controlled  . GERD   . HYPERLIPIDEMIA   . HYPERTENSION   . OSTEOPENIA   . Rectal fissure   . Psoriasis     severe, began soriatane 01/2012  . Scoliosis     Review of Systems  Constitutional: Positive for fatigue. Negative for unexpected weight change.  Respiratory: Negative for cough and shortness of breath.   Cardiovascular: Negative for chest pain and leg swelling.       Objective:    Physical Exam  Constitutional: She appears well-developed and well-nourished. No distress.  Cardiovascular: Normal rate, regular rhythm and normal heart sounds.   No murmur heard. Pulmonary/Chest: Effort normal and breath sounds normal. No respiratory distress.  Musculoskeletal: She exhibits no edema.    BP 128/80 mmHg  Pulse 68  Temp(Src) 97.5 F (36.4 C) (Oral)  Ht '5\' 2"'$  (1.575 m)  Wt 137 lb (62.143 kg)  BMI 25.05 kg/m2  SpO2 98% Wt Readings from Last 3 Encounters:  12/23/14 137 lb (62.143 kg)  06/03/14 138 lb (62.596 kg)  10/22/13 139 lb (63.05 kg)     Lab Results  Component Value Date   WBC 8.6 12/28/2012   HGB 13.3 12/28/2012   HCT 39.5 12/28/2012   PLT 304.0 12/28/2012   GLUCOSE 111* 06/03/2014   CHOL 231* 10/22/2013   TRIG 231.0* 10/22/2013   HDL 55.00 10/22/2013   LDLDIRECT 148.8 10/22/2013   LDLCALC 138* 06/17/2013   ALT 27 12/28/2012   AST 21 12/28/2012   NA 138 06/03/2014   K 4.1 06/03/2014   CL 103  06/03/2014   CREATININE 0.86 06/03/2014   BUN 15 06/03/2014   CO2 29 06/03/2014   TSH 1.58 04/18/2011   HGBA1C 6.1 06/03/2014   MICROALBUR <0.7 06/03/2014    Mm Diag Breast Tomo Uni Right  10/27/2014  CLINICAL DATA:  Six month follow-up of the right breast following ultrasound-guided biopsy of the right breast. Pathology results showed fibrocystic changes with usual ductal hyperplasia. EXAM: DIGITAL DIAGNOSTIC RIGHT MAMMOGRAM WITH 3D TOMOSYNTHESIS AND CAD COMPARISON:  04/23/2014, 04/09/2014, 04/02/2014, 02/12/2013 ACR Breast Density Category b: There are scattered areas of fibroglandular density. FINDINGS: Coil shaped biopsy clip is present in the posterior third of the outer right breast. The previously described mass in the outer right breast that was biopsied under ultrasound guidance in March 2016 is no longer present following biopsy, consistent with resolution of a cyst. No new or suspicious mass, distortion, or suspicious microcalcification is identified in the right breast. Mammographic images were processed with CAD. IMPRESSION: No evidence of malignancy in the right breast. RECOMMENDATION: Bilateral screening mammogram is recommended in February 2017. I have discussed the findings and recommendations with the patient. Results were also provided in writing at the conclusion of the visit. If applicable, a reminder letter will be sent to the patient regarding the next appointment. BI-RADS CATEGORY  1:  Negative. Electronically Signed   By: Curlene Dolphin M.D.   On: 10/27/2014 14:41       Assessment & Plan:   Problem List Items Addressed This Visit    None       Deanza Grant, MD

## 2014-12-23 NOTE — Assessment & Plan Note (Signed)
on omega-3 but causes bruising  variable compliance with statin therapy as prescribed since 2010 -has tried both pravastatin and lovastatin in the past- represcribed statin therapy 12/2010, but intermittent same due to concern for side effects (increase in myalgias) Again re-rx'd prava 02/2013, but stopped same 5 2015 in the setting of exacerbation of back pain Check lipids annually, last reviewed Patient willing to resume low-dose lovastatin if needed but prefers to focus on diet and exercise -encouragement to focus on weight loss reviewed today

## 2014-12-23 NOTE — Assessment & Plan Note (Signed)
Lab Results  Component Value Date   HGBA1C 6.1 06/03/2014   Diet controlled by history Reports recent "binge eating" due to mother's illness and hosp/SNF stay check A1c today to monitor same

## 2014-12-25 ENCOUNTER — Telehealth: Payer: Self-pay | Admitting: Internal Medicine

## 2014-12-25 MED ORDER — LOVASTATIN 20 MG PO TABS: 20.0000 mg | ORAL_TABLET | Freq: Every day | ORAL | Status: DC

## 2014-12-25 NOTE — Telephone Encounter (Signed)
Pt called back and I read Dr's notes to her. She is willing to try the statin and you can call in to [facility] pharmacy. She would also like all her lab results mailed to her.

## 2014-12-25 NOTE — Telephone Encounter (Signed)
As discussed at our [place], will resume low-dose lovastatin - erc done Please let pt know same thanks

## 2014-12-29 NOTE — Telephone Encounter (Signed)
LVM for pt to call back as soon as possible. \  RE: MD response below.  

## 2014-12-30 NOTE — Telephone Encounter (Signed)
Patient called in. Advised of below

## 2015-03-23 ENCOUNTER — Other Ambulatory Visit: Payer: Self-pay

## 2015-03-23 DIAGNOSIS — Z1231 Encounter for screening mammogram for malignant neoplasm of breast: Secondary | ICD-10-CM

## 2015-04-08 ENCOUNTER — Ambulatory Visit
Admission: RE | Admit: 2015-04-08 | Discharge: 2015-04-08 | Disposition: A | Discharge status: Home / Self Care | Payer: Medicare Other | Source: Ambulatory Visit

## 2015-04-08 DIAGNOSIS — Z1231 Encounter for screening mammogram for malignant neoplasm of breast: Secondary | ICD-10-CM

## 2015-04-22 ENCOUNTER — Encounter: Payer: Self-pay | Admitting: Internal Medicine

## 2015-04-22 ENCOUNTER — Ambulatory Visit (INDEPENDENT_AMBULATORY_CARE_PROVIDER_SITE_OTHER): Payer: Medicare Other | Admitting: Internal Medicine

## 2015-04-22 ENCOUNTER — Other Ambulatory Visit (INDEPENDENT_AMBULATORY_CARE_PROVIDER_SITE_OTHER): Payer: Medicare Other

## 2015-04-22 VITALS — BP 140/88 | HR 77 | Temp 98.4°F | Resp 16 | Wt 140.0 lb

## 2015-04-22 DIAGNOSIS — E1165 Type 2 diabetes mellitus with hyperglycemia: Secondary | ICD-10-CM

## 2015-04-22 DIAGNOSIS — K219 Gastro-esophageal reflux disease without esophagitis: Secondary | ICD-10-CM

## 2015-04-22 DIAGNOSIS — I10 Essential (primary) hypertension: Secondary | ICD-10-CM

## 2015-04-22 DIAGNOSIS — E785 Hyperlipidemia, unspecified: Secondary | ICD-10-CM

## 2015-04-22 DIAGNOSIS — Z139 Encounter for screening, unspecified: Secondary | ICD-10-CM

## 2015-04-22 LAB — COMPREHENSIVE METABOLIC PANEL
ALT: 49 U/L — ABNORMAL HIGH (ref 0–35)
AST: 30 U/L (ref 0–37)
Albumin: 4.5 g/dL (ref 3.5–5.2)
Alkaline Phosphatase: 79 U/L (ref 39–117)
BUN: 17 mg/dL (ref 6–23)
CO2: 29 mEq/L (ref 19–32)
Calcium: 10.6 mg/dL — ABNORMAL HIGH (ref 8.4–10.5)
Chloride: 100 mEq/L (ref 96–112)
Creatinine, Ser: 0.93 mg/dL (ref 0.40–1.20)
GFR: 63.08 mL/min (ref >60.00)
Glucose, Bld: 120 mg/dL — ABNORMAL HIGH (ref 70–99)
Potassium: 4.6 mEq/L (ref 3.5–5.1)
Sodium: 138 mEq/L (ref 135–145)
Total Bilirubin: 1 mg/dL (ref 0.2–1.2)
Total Protein: 7.8 g/dL (ref 6.0–8.3)

## 2015-04-22 LAB — LIPID PANEL
Cholesterol: 274 mg/dL — ABNORMAL HIGH (ref 0–200)
HDL: 50.8 mg/dL (ref >39.00)
NonHDL: 222.83
Total CHOL/HDL Ratio: 5
Triglycerides: 300 mg/dL — ABNORMAL HIGH (ref 0.0–149.0)
VLDL: 60 mg/dL — ABNORMAL HIGH (ref 0.0–40.0)

## 2015-04-22 LAB — LDL CHOLESTEROL, DIRECT: Direct LDL: 151 mg/dL

## 2015-04-22 LAB — HEMOGLOBIN A1C: Hgb A1c MFr Bld: 6.4 % (ref 4.6–6.5)

## 2015-04-22 NOTE — Assessment & Plan Note (Signed)
Controlled Requires daily medication Continue omeprazole daily Improve diet

## 2015-04-22 NOTE — Assessment & Plan Note (Signed)
Has been well controlled Continue exercise Improve diet Check a1c Continue control with lifestyle

## 2015-04-22 NOTE — Patient Instructions (Signed)
   Test(s) ordered today. Your results will be released to [place] (or called to you) after review, usually within 72hours after test completion. If any changes need to be made, you will be notified at that same time.  All other Health Maintenance issues reviewed.   All recommended immunizations and age-appropriate screenings are up-to-date.  No immunizations administered today.   Medications reviewed and updated.  No changes recommended at this time.   Please followup in 6 months for a physical

## 2015-04-22 NOTE — Progress Notes (Signed)
Pre visit review using our clinic review tool, if applicable. No additional management support is needed unless otherwise documented below in the visit note. 

## 2015-04-22 NOTE — Assessment & Plan Note (Signed)
She monitors at home - overall well controlled Slightly elevated here - some degree of white coat htn Continue current meds Continue regular exercise Improve diet Check cmp

## 2015-04-22 NOTE — Progress Notes (Signed)
Subjective:    Patient ID: [name], female    DOB: Jul 11, 1945, 70 y.o.   MRN: [id number]  HPI She is here to establish with a new pcp.    Diabetes: She is controlling with lifestyle. She is not compliant with a diabetic diet. She is exercising regularly. She monitors her sugars on occasion and they have been running in the 90-120's. She checks her feet daily and denies foot lesions. She is up-to-date with an ophthalmology examination.   Hypertension: She is taking her medication daily. She is compliant with a low sodium diet.  She denies chest pain, palpitations, edema, shortness of breath and regular headaches. She is exercising regularly.  She does monitor her blood pressure at home and overall it is well controlled.    Hyperlipidemia: She has tried taking the lovastatin several times and has had to stop it intermittently.  She has not tolerated other statins due to muscle aches.  With the lovastatin she has dizziness, fatigue and it feels like her muscles due not work. She has tried even 10 mg and still has side effects.  She is not compliant with a low fat/cholesterol diet. She is exercising regularly - walks.   GERD:  She is taking her medication daily as prescribed.  She denies any GERD symptoms and feels her GERD is well controlled. She is not always compliant with a GERD diet, especially recent.y.  Psoriasis:  She is on enbrel and has just started laser treatment on her hands and feet.  Medications and allergies reviewed with patient and updated if appropriate.  Patient Active Problem List   Diagnosis Date Noted  . Psoriasis   . Scoliosis   . Diabetes type 2, controlled ([place]) 06/03/2008  . Dyslipidemia 06/03/2008  . ANEMIA-NOS 06/03/2008  . CARPAL TUNNEL SYNDROME 06/03/2008  . Essential hypertension 06/03/2008  . ALLERGIC RHINITIS 06/03/2008  . ASTHMA 06/03/2008  . GERD 06/03/2008  . OSTEOPENIA 06/03/2008  . COLONIC POLYPS, HX OF 06/03/2008    Current  Outpatient Prescriptions on File Prior to Visit  Medication Sig Dispense Refill  . Azelaic Acid (FINACEA) 15 % cream Apply topically daily. After skin is thoroughly washed and patted dry, gently but thoroughly massage a thin film of azelaic acid cream into the affected area once daily, in the morning and evening.    . betamethasone dipropionate (DIPROLENE) 0.05 % ointment Apply topically daily.     . clobetasol (TEMOVATE) 0.05 % external solution Apply topically 2 (two) times daily.      Marland Kitchen doxylamine, Sleep, (UNISOM) 25 MG tablet Take 15 mg by mouth at bedtime as needed.     Scarlette Shorts SURECLICK 50 MG/ML injection Once a week    . glucose blood (ONE TOUCH ULTRA TEST) test strip Use test strip to check blood sugar at least 3 times 300 each 1  . halobetasol (ULTRAVATE) 0.05 % cream Apply topically 3 (three) times a week.      Marland Kitchen ibuprofen (ADVIL) 200 MG tablet Take 400 mg by mouth as needed.      . Lancets (ONETOUCH ULTRASOFT) lancets Use lancet to test blood sugar at least twice a day or as directed by your physician. 200 each 11  . loratadine (CLARITIN REDITABS) 10 MG dissolvable tablet Take 10 mg by mouth every other day as needed for allergies.    Marland Kitchen lovastatin (MEVACOR) 20 MG tablet Take 1 tablet (20 mg total) by mouth at bedtime. 90 tablet 3  . Multiple Vitamin (MULTIVITAMIN)  tablet Take 1 tablet by mouth daily.      . NON FORMULARY [place] Tea 2-3 cups a day    . olmesartan (BENICAR) 20 MG tablet Take 1 tablet (20 mg total) by mouth daily. 30 tablet 11  . omeprazole (PRILOSEC) 20 MG capsule Take 20 mg by mouth daily. OTC    . oxymetazoline (AFRIN 12 HOUR) 0.05 % nasal spray Place 1 spray into the nose daily.      . diazepam (VALIUM) 2 MG tablet Take 0.5-1 tablets (1-2 mg total) by mouth every 12 (twelve) hours as needed for anxiety. (Patient not taking: Reported on 04/22/2015) 10 tablet 0   No current facility-administered medications on file prior to visit.    Past Medical History  Diagnosis  Date  . ALLERGIC RHINITIS   . ANEMIA-NOS   . ASTHMA   . Carpal tunnel syndrome   . COLONIC POLYPS, HX OF   . DIABETES MELLITUS, TYPE II     diet controlled  . GERD   . HYPERLIPIDEMIA   . HYPERTENSION   . OSTEOPENIA   . Rectal fissure   . Psoriasis     severe, began soriatane 01/2012  . Scoliosis     Past Surgical History  Procedure Laterality Date  . Cesarean section    . Breast surgery  1988    biopsy  . Benign rectal growth  2004    removed by Dr. Zella Richer    Social History   Social History  . Marital Status: Single    Spouse Name: N/A  . Number of Children: N/A  . Years of Education: N/A   Social History Main Topics  . Smoking status: Former Smoker    Quit date: 02/14/1974  . Smokeless tobacco: Not on file  . Alcohol Use: No  . Drug Use: No  . Sexual Activity: Not on file   Other Topics Concern  . Not on file   Social History Narrative    Family History  Problem Relation Age of Onset  . Lung cancer Father   . Arthritis Other     Parents  . Asthma Other     parent, other relative  . Breast cancer Other     other relative  . Hypertension Other     parent, other relative  . Heart disease Other     parent, other relative    Review of Systems  Constitutional: Negative for fever.  HENT: Positive for congestion and sneezing.   Respiratory: Negative for cough, shortness of breath and wheezing.   Cardiovascular: Negative for chest pain, palpitations and leg swelling.  Gastrointestinal: Positive for constipation.  Neurological: Positive for dizziness. Negative for light-headedness and headaches.       Objective:   Filed Vitals:   04/22/15 0801  BP: 140/88  Pulse: 77  Temp: 98.4 F (36.9 C)  Resp: 16   Filed Weights   04/22/15 0801  Weight: 140 lb (63.504 kg)   Body mass index is 25.6 kg/(m^2).   Physical Exam Constitutional: Appears well-developed and well-nourished. No distress.  Neck: Neck supple. No tracheal deviation present.  No thyromegaly present.  No carotid bruit. No cervical adenopathy.   Cardiovascular: Normal rate, regular rhythm and normal heart sounds.   No murmur heard.  No edema Pulmonary/Chest: Effort normal and breath sounds normal. No respiratory distress. No wheezes.        Assessment & Plan:   See Problem List for Assessment and Plan of chronic medical problems.  Follow up  in 6 months - PE Follow up in 6 months

## 2015-04-22 NOTE — Assessment & Plan Note (Signed)
Does not tolerate statins - on lovastatin 10 mg daily now -- doing ok, but has occasional side effects She will try to continue lovastatin at low dose Check lipids Will try zetia if LDL is still elevated, which it likely will be Recheck labs after 6 weeks if she takes the zetia Continue exercise Work on improving diet

## 2015-04-23 LAB — HEPATITIS C ANTIBODY: HCV Ab: NEGATIVE

## 2015-04-24 ENCOUNTER — Encounter: Payer: Self-pay | Admitting: Emergency Medicine

## 2015-04-24 ENCOUNTER — Other Ambulatory Visit: Payer: Self-pay | Admitting: Internal Medicine

## 2015-04-24 DIAGNOSIS — E785 Hyperlipidemia, unspecified: Secondary | ICD-10-CM

## 2015-04-24 MED ORDER — EZETIMIBE 10 MG PO TABS: 10.0000 mg | ORAL_TABLET | Freq: Every day | ORAL | Status: DC

## 2015-04-26 ENCOUNTER — Other Ambulatory Visit: Payer: Self-pay | Admitting: Internal Medicine

## 2015-05-19 ENCOUNTER — Inpatient Hospital Stay (HOSPITAL_COMMUNITY): Admission: RE | Admit: 2015-05-19 | Payer: Medicare Other | Source: Ambulatory Visit

## 2015-05-27 ENCOUNTER — Other Ambulatory Visit: Payer: Self-pay | Admitting: Emergency Medicine

## 2015-05-27 ENCOUNTER — Ambulatory Visit (HOSPITAL_COMMUNITY)
Admission: RE | Admit: 2015-05-27 | Discharge: 2015-05-27 | Disposition: A | Discharge status: Home / Self Care | Payer: Medicare Other | Source: Ambulatory Visit | Attending: Cardiology | Admitting: Cardiology

## 2015-05-27 DIAGNOSIS — K219 Gastro-esophageal reflux disease without esophagitis: Secondary | ICD-10-CM | POA: Diagnosis not present

## 2015-05-27 DIAGNOSIS — I6523 Occlusion and stenosis of bilateral carotid arteries: Secondary | ICD-10-CM | POA: Diagnosis not present

## 2015-05-27 DIAGNOSIS — I10 Essential (primary) hypertension: Secondary | ICD-10-CM | POA: Diagnosis not present

## 2015-05-27 DIAGNOSIS — E785 Hyperlipidemia, unspecified: Secondary | ICD-10-CM | POA: Diagnosis not present

## 2015-05-27 DIAGNOSIS — E1165 Type 2 diabetes mellitus with hyperglycemia: Secondary | ICD-10-CM | POA: Diagnosis not present

## 2015-05-27 DIAGNOSIS — R938 Abnormal findings on diagnostic imaging of other specified body structures: Secondary | ICD-10-CM | POA: Diagnosis not present

## 2015-05-27 MED ORDER — OLMESARTAN MEDOXOMIL 20 MG PO TABS: 20.0000 mg | ORAL_TABLET | Freq: Every day | ORAL | Status: DC

## 2015-05-27 MED ORDER — GLUCOSE BLOOD VI STRP: ORAL_STRIP | Status: DC

## 2015-05-31 ENCOUNTER — Encounter: Payer: Self-pay | Admitting: Internal Medicine

## 2015-05-31 ENCOUNTER — Other Ambulatory Visit: Payer: Self-pay | Admitting: Internal Medicine

## 2015-05-31 DIAGNOSIS — E041 Nontoxic single thyroid nodule: Secondary | ICD-10-CM

## 2015-05-31 DIAGNOSIS — I739 Peripheral vascular disease, unspecified: Secondary | ICD-10-CM

## 2015-05-31 DIAGNOSIS — I779 Disorder of arteries and arterioles, unspecified: Secondary | ICD-10-CM | POA: Insufficient documentation

## 2015-06-04 ENCOUNTER — Telehealth: Payer: Self-pay | Admitting: Internal Medicine

## 2015-06-04 NOTE — Telephone Encounter (Signed)
Pt called returning your call from 06/02/15 pt was out of town. Looks like pt had a [place] of carotid artery. Call pt @ [phone]. Thank you!

## 2015-06-04 NOTE — Telephone Encounter (Signed)
Spoke with pt to inform.  

## 2015-06-11 ENCOUNTER — Telehealth: Payer: Self-pay | Admitting: Emergency Medicine

## 2015-06-11 ENCOUNTER — Ambulatory Visit
Admission: RE | Admit: 2015-06-11 | Discharge: 2015-06-11 | Disposition: A | Discharge status: Home / Self Care | Payer: Medicare Other | Source: Ambulatory Visit | Attending: Internal Medicine | Admitting: Internal Medicine

## 2015-06-11 DIAGNOSIS — E041 Nontoxic single thyroid nodule: Secondary | ICD-10-CM

## 2015-06-11 NOTE — Telephone Encounter (Signed)
Labs ordered.

## 2015-06-11 NOTE — Telephone Encounter (Signed)
-----   Message from [name], MD sent at 06/11/2015  4:36 PM EDT ----- Yes, ok to order.  Also order cbc

## 2015-06-22 ENCOUNTER — Other Ambulatory Visit (INDEPENDENT_AMBULATORY_CARE_PROVIDER_SITE_OTHER): Payer: Medicare Other

## 2015-06-22 DIAGNOSIS — E041 Nontoxic single thyroid nodule: Secondary | ICD-10-CM | POA: Diagnosis not present

## 2015-06-22 DIAGNOSIS — E785 Hyperlipidemia, unspecified: Secondary | ICD-10-CM | POA: Diagnosis not present

## 2015-06-22 DIAGNOSIS — R7989 Other specified abnormal findings of blood chemistry: Secondary | ICD-10-CM

## 2015-06-22 LAB — COMPREHENSIVE METABOLIC PANEL
ALT: 38 U/L — ABNORMAL HIGH (ref 0–35)
AST: 22 U/L (ref 0–37)
Albumin: 4.3 g/dL (ref 3.5–5.2)
Alkaline Phosphatase: 71 U/L (ref 39–117)
BUN: 16 mg/dL (ref 6–23)
CO2: 29 mEq/L (ref 19–32)
Calcium: 10.1 mg/dL (ref 8.4–10.5)
Chloride: 103 mEq/L (ref 96–112)
Creatinine, Ser: 0.82 mg/dL (ref 0.40–1.20)
GFR: 72.9 mL/min (ref >60.00)
Glucose, Bld: 91 mg/dL (ref 70–99)
Potassium: 4.1 mEq/L (ref 3.5–5.1)
Sodium: 140 mEq/L (ref 135–145)
Total Bilirubin: 0.5 mg/dL (ref 0.2–1.2)
Total Protein: 7.5 g/dL (ref 6.0–8.3)

## 2015-06-22 LAB — CBC WITH DIFFERENTIAL/PLATELET
Basophils Absolute: 0.1 10*3/uL (ref 0.0–0.1)
Basophils Relative: 1.1 % (ref 0.0–3.0)
Eosinophils Absolute: 0.3 10*3/uL (ref 0.0–0.7)
Eosinophils Relative: 4.1 % (ref 0.0–5.0)
HCT: 39.3 % (ref 36.0–46.0)
Hemoglobin: 13.4 g/dL (ref 12.0–15.0)
Lymphocytes Relative: 36.9 % (ref 12.0–46.0)
Lymphs Abs: 2.4 10*3/uL (ref 0.7–4.0)
MCHC: 34.1 g/dL (ref 30.0–36.0)
MCV: 92.8 fl (ref 78.0–100.0)
Monocytes Absolute: 0.5 10*3/uL (ref 0.1–1.0)
Monocytes Relative: 7.8 % (ref 3.0–12.0)
Neutro Abs: 3.2 10*3/uL (ref 1.4–7.7)
Neutrophils Relative %: 50.1 % (ref 43.0–77.0)
Platelets: 303 10*3/uL (ref 150.0–400.0)
RBC: 4.23 Mil/uL (ref 3.87–5.11)
RDW: 14 % (ref 11.5–15.5)
WBC: 6.5 10*3/uL (ref 4.0–10.5)

## 2015-06-22 LAB — LIPID PANEL
Cholesterol: 264 mg/dL — ABNORMAL HIGH (ref 0–200)
HDL: 50.5 mg/dL (ref >39.00)
NonHDL: 213.48
Total CHOL/HDL Ratio: 5
Triglycerides: 228 mg/dL — ABNORMAL HIGH (ref 0.0–149.0)
VLDL: 45.6 mg/dL — ABNORMAL HIGH (ref 0.0–40.0)

## 2015-06-22 LAB — TSH: TSH: 2.44 u[IU]/mL (ref 0.35–4.50)

## 2015-06-22 LAB — LDL CHOLESTEROL, DIRECT: Direct LDL: 166 mg/dL

## 2015-06-23 ENCOUNTER — Encounter: Payer: Self-pay | Admitting: Emergency Medicine

## 2015-10-08 ENCOUNTER — Ambulatory Visit (INDEPENDENT_AMBULATORY_CARE_PROVIDER_SITE_OTHER): Payer: Medicare Other | Admitting: Family

## 2015-10-08 ENCOUNTER — Telehealth: Payer: Self-pay | Admitting: Internal Medicine

## 2015-10-08 ENCOUNTER — Encounter: Payer: Self-pay | Admitting: Family

## 2015-10-08 DIAGNOSIS — J4530 Mild persistent asthma, uncomplicated: Secondary | ICD-10-CM

## 2015-10-08 DIAGNOSIS — J45909 Unspecified asthma, uncomplicated: Secondary | ICD-10-CM | POA: Insufficient documentation

## 2015-10-08 MED ORDER — LEVOFLOXACIN 500 MG PO TABS: 500.0000 mg | ORAL_TABLET | Freq: Every day | ORAL | 0 refills | Status: DC

## 2015-10-08 MED ORDER — FLUTICASONE FUROATE-VILANTEROL 100-25 MCG/INH IN AEPB: 1.0000 | INHALATION_SPRAY | Freq: Every day | RESPIRATORY_TRACT | 0 refills | Status: DC

## 2015-10-08 NOTE — Progress Notes (Signed)
Subjective:    Patient ID: [name], female    DOB: Jun 15, 1945, 70 y.o.   MRN: [id number]  Chief Complaint  Patient presents with  . Cough    x4 days, cough, chest tightness, congestion, if she puts her head down she can not breath good at all, fever    HPI:  [name] is a 70 y.o. female who  has a past medical history of ALLERGIC RHINITIS; ANEMIA-NOS; ASTHMA; Carpal tunnel syndrome; COLONIC POLYPS, HX OF; DIABETES MELLITUS, TYPE II; GERD; HYPERLIPIDEMIA; HYPERTENSION; OSTEOPENIA; Psoriasis; Rectal fissure; and Scoliosis. and presents today for an acute office visit.  This is a new problem. Associated symptoms of cough, chest tightness and congestion hemming going on for approximately 4 days. Describes her symptoms are worse when she places her head down. Notes a fever of 100.5 yesterday . Modifying factors include Advil which helped a little with her symptoms. She is on Enbrel. Cough is non-productive. Describes feeling a large plug of mucus may be blocking her airway. No recent antibiotics.    Allergies  Allergen Reactions  . Clarithromycin   . Codeine   . Statins   . Tetracycline       Outpatient Medications Prior to Visit  Medication Sig Dispense Refill  . Azelaic Acid (FINACEA) 15 % cream Apply topically daily. After skin is thoroughly washed and patted dry, gently but thoroughly massage a thin film of azelaic acid cream into the affected area once daily, in the morning and evening.    . clobetasol (TEMOVATE) 0.05 % external solution Apply topically 2 (two) times daily.      Marland Kitchen doxylamine, Sleep, (UNISOM) 25 MG tablet Take 15 mg by mouth at bedtime as needed.     Scarlette Shorts SURECLICK 50 MG/ML injection Once a week    . ezetimibe (ZETIA) 10 MG tablet Take 1 tablet (10 mg total) by mouth daily. 90 tablet 3  . glucose blood (ONE TOUCH ULTRA TEST) test strip Use test strip to check blood sugar at least 3 times 300 each 1  . halobetasol (ULTRAVATE) 0.05 % cream  Apply topically 3 (three) times a week.      Marland Kitchen ibuprofen (ADVIL) 200 MG tablet Take 400 mg by mouth as needed.      . Lancets (ONETOUCH ULTRASOFT) lancets Use lancet to test blood sugar at least twice a day or as directed by your physician. 200 each 11  . loratadine (CLARITIN REDITABS) 10 MG dissolvable tablet Take 10 mg by mouth every other day as needed for allergies.    . Multiple Vitamin (MULTIVITAMIN) tablet Take 1 tablet by mouth daily.      . NON FORMULARY Hibicus Tea 2-3 cups a day    . olmesartan (BENICAR) 20 MG tablet Take 1 tablet (20 mg total) by mouth daily. 30 tablet 11  . omeprazole (PRILOSEC) 20 MG capsule Take 20 mg by mouth daily. OTC    . oxymetazoline (AFRIN 12 HOUR) 0.05 % nasal spray Place 1 spray into the nose daily.      . betamethasone dipropionate (DIPROLENE) 0.05 % ointment Apply topically daily.     . Coenzyme Q10 (CO Q 10 PO) Take 400 mg by mouth daily.    . Inulin (FIBER CHOICE PO) Take by mouth. 2-3 tablets daily    . Omega 3 1000 MG CAPS Take 1,000 mg by mouth daily.     No facility-administered medications prior to visit.       Past Surgical History:  Procedure Laterality Date  . benign rectal growth  2004   removed by Dr. Zella Richer  . BREAST SURGERY  1988   biopsy  . CESAREAN SECTION        Past Medical History:  Diagnosis Date  . ALLERGIC RHINITIS   . ANEMIA-NOS   . ASTHMA   . Carpal tunnel syndrome   . COLONIC POLYPS, HX OF   . DIABETES MELLITUS, TYPE II    diet controlled  . GERD   . HYPERLIPIDEMIA   . HYPERTENSION   . OSTEOPENIA   . Psoriasis    severe, began soriatane 01/2012  . Rectal fissure   . Scoliosis       Review of Systems  Constitutional: Positive for fever. Negative for chills.  HENT: Positive for congestion, ear pain and sore throat. Negative for sinus pressure.   Respiratory: Positive for cough, chest tightness and shortness of breath.   Neurological: Negative for headaches.      Objective:    BP (!)  160/110 (BP Location: Left Arm, Patient Position: Sitting, Cuff Size: Normal)   Pulse 87   Temp 98.4 F (36.9 C) (Oral)   Resp 18   Ht '5\' 2"'$  (1.575 m)   Wt 142 lb (64.4 kg)   SpO2 98%   BMI 25.97 kg/m  Nursing note and vital signs reviewed.  Physical Exam  Constitutional: She is oriented to person, place, and time. She appears well-developed and well-nourished. No distress.  HENT:  Right Ear: Hearing, tympanic membrane, external ear and ear canal normal.  Left Ear: Hearing, tympanic membrane, external ear and ear canal normal.  Nose: Nose normal.  Mouth/Throat: Uvula is midline, oropharynx is clear and moist and mucous membranes are normal.  Cardiovascular: Normal rate, regular rhythm, normal heart sounds and intact distal pulses.   Pulmonary/Chest: Effort normal. No respiratory distress. She has wheezes. She has no rales. She exhibits no tenderness.  Neurological: She is alert and oriented to person, place, and time.  Skin: Skin is warm and dry.  Psychiatric: She has a normal mood and affect. Her behavior is normal. Judgment and thought content normal.       Assessment & Plan:   Problem List Items Addressed This Visit      Respiratory   Asthmatic bronchitis    Symptoms and exam consistent with asthmatic bronchitis. Start levofloxacin. Start Breo. Continue over-the-counter medications as needed for symptom relief and supportive care. Follow-up if symptoms worsen or do not improve.      Relevant Medications   fluticasone furoate-vilanterol (BREO ELLIPTA) 100-25 MCG/INH AEPB   levofloxacin (LEVAQUIN) 500 MG tablet    Other Visit Diagnoses   None.     I have discontinued [name]'s betamethasone dipropionate, Inulin (FIBER CHOICE PO), Coenzyme Q10 (CO Q 10 PO), and Omega 3. I am also having her start on fluticasone furoate-vilanterol and levofloxacin. Additionally, I am having her maintain her oxymetazoline, clobetasol, Azelaic Acid, halobetasol, multivitamin, omeprazole,  doxylamine (Sleep), ibuprofen, ENBREL SURECLICK, NON FORMULARY, loratadine, onetouch ultrasoft, ezetimibe, glucose blood, and olmesartan.   Meds ordered this encounter  Medications  . fluticasone furoate-vilanterol (BREO ELLIPTA) 100-25 MCG/INH AEPB    Sig: Inhale 1 puff into the lungs daily.    Dispense:  28 each    Refill:  0    Order Specific Question:   Supervising Provider    Answer:   Pricilla Holm A [[id number]]  . levofloxacin (LEVAQUIN) 500 MG tablet    Sig: Take 1 tablet (500 mg total)  by mouth daily.    Dispense:  7 tablet    Refill:  0    Order Specific Question:   Supervising Provider    Answer:   Pricilla Holm A [[id number]]     Follow-up: Return if symptoms worsen or fail to improve.  Mauricio Po, FNP

## 2015-10-08 NOTE — Telephone Encounter (Signed)
Noted - to see Marya Amsler today

## 2015-10-08 NOTE — Assessment & Plan Note (Signed)
Symptoms and exam consistent with asthmatic bronchitis. Start levofloxacin. Start Breo. Continue over-the-counter medications as needed for symptom relief and supportive care. Follow-up if symptoms worsen or do not improve.

## 2015-10-08 NOTE — Patient Instructions (Signed)
Thank you for choosing [facility].  SUMMARY AND INSTRUCTIONS:  Medication:  Your prescription(s) have been submitted to your pharmacy or been printed and provided for you. Please take as directed and contact our office if you believe you are having problem(s) with the medication(s) or have any questions.  Follow up:  If your symptoms worsen or fail to improve, please contact our office for further instruction, or in case of emergency go directly to the emergency room at the closest medical facility.   General Recommendations:    Please drink plenty of fluids.  Get plenty of rest   Sleep in humidified air  Use saline nasal sprays  Netti pot   OTC Medications:  Decongestants - helps relieve congestion   Flonase (generic fluticasone) or Nasacort (generic triamcinolone) - please make sure to use the "cross-over" technique at a 45 degree angle towards the opposite eye as opposed to straight up the nasal passageway.   Sudafed (generic pseudoephedrine - Note this is the one that is available behind the pharmacy counter); Products with phenylephrine (-PE) may also be used but is often not as effective as pseudoephedrine.   If you have HIGH BLOOD PRESSURE - Coricidin HBP; AVOID any product that is -D as this contains pseudoephedrine which may increase your blood pressure.  Afrin (oxymetazoline) every 6-8 hours for up to 3 days.   Allergies - helps relieve runny nose, itchy eyes and sneezing   Claritin (generic loratidine), Allegra (fexofenidine), or Zyrtec (generic cyrterizine) for runny nose. These medications should not cause drowsiness.  Note - Benadryl (generic diphenhydramine) may be used however may cause drowsiness  Cough -   Delsym or Robitussin (generic dextromethorphan)  Expectorants - helps loosen mucus to ease removal   Mucinex (generic guaifenesin) as directed on the package.  Headaches / General Aches   Tylenol (generic acetaminophen) - DO NOT  EXCEED 3 grams (3,000 mg) in a 24 hour time period  Advil/Motrin (generic ibuprofen)   Sore Throat -   Salt water gargle   Chloraseptic (generic benzocaine) spray or lozenges / Sucrets (generic dyclonine)    Acute Bronchitis Bronchitis is inflammation of the airways that extend from the windpipe into the lungs (bronchi). The inflammation often causes mucus to develop. This leads to a cough, which is the most common symptom of bronchitis.  In acute bronchitis, the condition usually develops suddenly and goes away over time, usually in a couple weeks. Smoking, allergies, and asthma can make bronchitis worse. Repeated episodes of bronchitis may cause further lung problems.  CAUSES Acute bronchitis is most often caused by the same virus that causes a cold. The virus can spread from person to person (contagious) through coughing, sneezing, and touching contaminated objects. SIGNS AND SYMPTOMS   Cough.   Fever.   Coughing up mucus.   Body aches.   Chest congestion.   Chills.   Shortness of breath.   Sore throat.  DIAGNOSIS  Acute bronchitis is usually diagnosed through a physical exam. Your health care provider will also ask you questions about your medical history. Tests, such as chest X-rays, are sometimes done to rule out other conditions.  TREATMENT  Acute bronchitis usually goes away in a couple weeks. Oftentimes, no medical treatment is necessary. Medicines are sometimes given for relief of fever or cough. Antibiotic medicines are usually not needed but may be prescribed in certain situations. In some cases, an inhaler may be recommended to help reduce shortness of breath and control the cough. A cool mist  vaporizer may also be used to help thin bronchial secretions and make it easier to clear the chest.  HOME CARE INSTRUCTIONS  Get plenty of rest.   Drink enough fluids to keep your urine clear or pale yellow (unless you have a medical condition that requires fluid  restriction). Increasing fluids may help thin your respiratory secretions (sputum) and reduce chest congestion, and it will prevent dehydration.   Take medicines only as directed by your health care provider.  If you were prescribed an antibiotic medicine, finish it all even if you start to feel better.  Avoid smoking and secondhand smoke. Exposure to cigarette smoke or irritating chemicals will make bronchitis worse. If you are a smoker, consider using nicotine gum or skin patches to help control withdrawal symptoms. Quitting smoking will help your lungs heal faster.   Reduce the chances of another bout of acute bronchitis by washing your hands frequently, avoiding people with cold symptoms, and trying not to touch your hands to your mouth, nose, or eyes.   Keep all follow-up visits as directed by your health care provider.  SEEK MEDICAL CARE IF: Your symptoms do not improve after 1 week of treatment.  SEEK IMMEDIATE MEDICAL CARE IF:  You develop an increased fever or chills.   You have chest pain.   You have severe shortness of breath.  You have bloody sputum.   You develop dehydration.  You faint or repeatedly feel like you are going to pass out.  You develop repeated vomiting.  You develop a severe headache. MAKE SURE YOU:   Understand these instructions.  Will watch your condition.  Will get help right away if you are not doing well or get worse.   This information is not intended to replace advice given to you by your health care provider. Make sure you discuss any questions you have with your health care provider.   Document Released: 03/10/2004 Document Revised: 02/21/2014 Document Reviewed: 07/24/2012 Elsevier Interactive Patient Education [employer].

## 2015-10-08 NOTE — Telephone Encounter (Signed)
Patient Name: [name] DOB: 19-May-1945 Initial Comment Caller states she feels like she has something in her throat, she can't get any air. She has to sit up. Nurse Assessment Nurse: Ronnald Ramp, RN, Miranda Date/Time (Eastern Time): 10/08/2015 8:06:42 AM Confirm and document reason for call. If symptomatic, describe symptoms. You must click the next button to save text entered. ---Caller states for the last 3-4 days has felt like she an infection. Feels like there is mucus caught in her throat, sore throat, and having trouble breathing when laying down. She started running a fever yesterday. Has not checked temp today. Has the patient traveled out of the country within the last 30 days? ---No Does the patient have any new or worsening symptoms? ---Yes Will a triage be completed? ---Yes Related visit to physician within the last 2 weeks? ---No Does the PT have any chronic conditions? (i.e. diabetes, asthma, etc.) ---Yes List chronic conditions. ---Psoriasis, Diabetes, HTN, High Cholesterol Is this a behavioral health or substance abuse call? ---No Guidelines Guideline Title Affirmed Question Affirmed Notes Cough - Acute Productive [1] Fever > 100.5 F (38.1 C) AND [2] diabetes mellitus or weak immune system (e.g., HIV positive, cancer chemo, splenectomy, organ transplant, chronic steroids) Final Disposition User See Physician within 4 Hours (or PCP triage) Ronnald Ramp, RN, Miranda Comments No appt available with PCP. Appt scheduled at 10:30 today with Dr. Elna Breslow. Referrals REFERRED TO PCP OFFICE Disagree/Comply: Comply

## 2015-10-28 ENCOUNTER — Other Ambulatory Visit (INDEPENDENT_AMBULATORY_CARE_PROVIDER_SITE_OTHER): Payer: Medicare Other

## 2015-10-28 ENCOUNTER — Ambulatory Visit (INDEPENDENT_AMBULATORY_CARE_PROVIDER_SITE_OTHER): Payer: Medicare Other | Admitting: Internal Medicine

## 2015-10-28 ENCOUNTER — Encounter: Payer: Self-pay | Admitting: Internal Medicine

## 2015-10-28 VITALS — BP 200/106 | HR 79 | Temp 98.7°F | Resp 16 | Wt 141.0 lb

## 2015-10-28 DIAGNOSIS — Z0000 Encounter for general adult medical examination without abnormal findings: Secondary | ICD-10-CM | POA: Diagnosis not present

## 2015-10-28 DIAGNOSIS — L409 Psoriasis, unspecified: Secondary | ICD-10-CM

## 2015-10-28 DIAGNOSIS — K219 Gastro-esophageal reflux disease without esophagitis: Secondary | ICD-10-CM | POA: Diagnosis not present

## 2015-10-28 DIAGNOSIS — Z23 Encounter for immunization: Secondary | ICD-10-CM

## 2015-10-28 DIAGNOSIS — I10 Essential (primary) hypertension: Secondary | ICD-10-CM

## 2015-10-28 DIAGNOSIS — M8580 Other specified disorders of bone density and structure, unspecified site: Secondary | ICD-10-CM

## 2015-10-28 DIAGNOSIS — E1165 Type 2 diabetes mellitus with hyperglycemia: Secondary | ICD-10-CM

## 2015-10-28 DIAGNOSIS — J4520 Mild intermittent asthma, uncomplicated: Secondary | ICD-10-CM

## 2015-10-28 LAB — COMPREHENSIVE METABOLIC PANEL
ALT: 32 U/L (ref 0–35)
AST: 24 U/L (ref 0–37)
Albumin: 4.2 g/dL (ref 3.5–5.2)
Alkaline Phosphatase: 70 U/L (ref 39–117)
BUN: 11 mg/dL (ref 6–23)
CO2: 26 mEq/L (ref 19–32)
Calcium: 9.6 mg/dL (ref 8.4–10.5)
Chloride: 106 mEq/L (ref 96–112)
Creatinine, Ser: 0.88 mg/dL (ref 0.40–1.20)
GFR: 67.13 mL/min (ref >60.00)
Glucose, Bld: 114 mg/dL — ABNORMAL HIGH (ref 70–99)
Potassium: 4.2 mEq/L (ref 3.5–5.1)
Sodium: 142 mEq/L (ref 135–145)
Total Bilirubin: 0.6 mg/dL (ref 0.2–1.2)
Total Protein: 7.5 g/dL (ref 6.0–8.3)

## 2015-10-28 LAB — HEMOGLOBIN A1C: Hgb A1c MFr Bld: 6.2 % (ref 4.6–6.5)

## 2015-10-28 LAB — LIPID PANEL
Cholesterol: 231 mg/dL — ABNORMAL HIGH (ref 0–200)
HDL: 56.5 mg/dL (ref >39.00)
LDL Cholesterol: 141 mg/dL — ABNORMAL HIGH (ref 0–99)
NonHDL: 174.38
Total CHOL/HDL Ratio: 4
Triglycerides: 168 mg/dL — ABNORMAL HIGH (ref 0.0–149.0)
VLDL: 33.6 mg/dL (ref 0.0–40.0)

## 2015-10-28 LAB — MICROALBUMIN / CREATININE URINE RATIO
Creatinine,U: 26.9 mg/dL
Microalb Creat Ratio: 2.6 mg/g (ref 0.0–30.0)
Microalb, Ur: <0.7 mg/dL (ref 0.0–1.9)

## 2015-10-28 MED ORDER — GLUCOSE BLOOD VI STRP: ORAL_STRIP | 1 refills | Status: DC

## 2015-10-28 MED ORDER — ONETOUCH ULTRASOFT LANCETS MISC: 11 refills | Status: DC

## 2015-10-28 MED ORDER — AMLODIPINE BESYLATE 2.5 MG PO TABS: 2.5000 mg | ORAL_TABLET | Freq: Every day | ORAL | 3 refills | Status: DC

## 2015-10-28 NOTE — Progress Notes (Signed)
Subjective:    Patient ID: [name], female    DOB: 10/26/1945, 70 y.o.   MRN: [id number]  HPI Here for medicare wellness exam and an physical exam.   She spontaneously has bruising on hands, face, neck, chest, occasionally legs/feet. It does not always occur with trauma. She has gotten with for years, but it has gotten worse.   BP at home:  102/75-134/81 at rest.  Once it was 157/93.  She always checks it at rest.  She does feel is is higher on average and is spiking more with stress.   I have personally reviewed and have noted 1.The patient's medical and social history 2.Their use of alcohol, tobacco or illicit drugs 3.Their current medications and supplements 4.The patient's functional ability including ADL's, fall risks, home safety risks and hearing or visual impairment. 5.Diet and physical activities 6.Evidence for depression or mood disorders 7.Care team reviewed -- Dr Athena Masse for psoriasis   Are there smokers in your home (other than you)? No  Risk Factors Exercise: walking about 2 miles, exercising less in the past couple of weeks Dietary issues discussed:  Eating too much junk and sweets.  Needs to change her diet which she knows - will change diet  Cardiac risk factors: advanced age, hypertension, hyperlipidemia   Depression Screen  Have you felt down, depressed or hopeless? No  Have you felt little interest or pleasure in doing things?  No  Activities of Daily Living In your present state of health, do you have any difficulty performing the following activities?:  Driving? No Managing money?  No Feeding yourself? No Getting from bed to chair? No Climbing a flight of stairs? No Preparing food and eating?: No Bathing or showering? No Getting dressed: No Getting to/using the toilet? No Moving around from place to place: No In the past year have you fallen or had a near fall?: yes, fell  pulling ivy our of trees   Are you sexually active?  No  Do you have more than one partner?  N/A  Hearing Difficulties: No Do you often ask people to speak up or repeat themselves? No Do you experience ringing or noises in your ears? No Do you have difficulty understanding soft or whispered voices? No Vision:              Any change in vision:  no             Up to date with eye exam:  yes Memory:  Do you feel that you have a problem with memory? No  Do you often misplace items? Yes, places something somewhere and does not remember where she left it  Do you feel safe at home?  Yes  Cognitive Testing  Alert, Orientated? Yes  Normal Appearance? Yes  Recall of three objects?  Yes  Can perform simple calculations? Yes  Displays appropriate judgment? Yes  Can read the correct time from a watch face? Yes   Advanced Directives have been discussed with the patient? Yes   Medications and allergies reviewed with patient and updated if appropriate.  Patient Active Problem List   Diagnosis Date Noted  . Bilateral carotid artery disease, Mild 05/31/2015  . Thyroid nodule 05/31/2015  . Psoriasis   . Scoliosis   . Diabetes type 2, controlled ([place]) 06/03/2008  . Dyslipidemia 06/03/2008  . CARPAL TUNNEL SYNDROME 06/03/2008  . Essential hypertension 06/03/2008  . ALLERGIC RHINITIS 06/03/2008  . ASTHMA 06/03/2008  . GERD 06/03/2008  .  Osteopenia 06/03/2008  . COLONIC POLYPS, HX OF 06/03/2008    Current Outpatient Prescriptions on File Prior to Visit  Medication Sig Dispense Refill  . Azelaic Acid (FINACEA) 15 % cream Apply topically daily. After skin is thoroughly washed and patted dry, gently but thoroughly massage a thin film of azelaic acid cream into the affected area once daily, in the morning and evening.    . clobetasol (TEMOVATE) 0.05 % external solution Apply topically 2 (two) times daily.      Marland Kitchen doxylamine, Sleep, (UNISOM) 25 MG tablet Take 15 mg by mouth at bedtime as  needed.     Scarlette Shorts SURECLICK 50 MG/ML injection Once a week    . ezetimibe (ZETIA) 10 MG tablet Take 1 tablet (10 mg total) by mouth daily. 90 tablet 3  . glucose blood (ONE TOUCH ULTRA TEST) test strip Use test strip to check blood sugar at least 3 times 300 each 1  . halobetasol (ULTRAVATE) 0.05 % cream Apply topically 3 (three) times a week.      . Lancets (ONETOUCH ULTRASOFT) lancets Use lancet to test blood sugar at least twice a day or as directed by your physician. 200 each 11  . loratadine (CLARITIN REDITABS) 10 MG dissolvable tablet Take 10 mg by mouth every other day as needed for allergies.    . Multiple Vitamin (MULTIVITAMIN) tablet Take 1 tablet by mouth daily.      . NON FORMULARY [place] Tea 2-3 cups a day    . olmesartan (BENICAR) 20 MG tablet Take 1 tablet (20 mg total) by mouth daily. 30 tablet 11  . omeprazole (PRILOSEC) 20 MG capsule Take 20 mg by mouth daily. OTC    . oxymetazoline (AFRIN 12 HOUR) 0.05 % nasal spray Place 1 spray into the nose daily.       No current facility-administered medications on file prior to visit.     Past Medical History:  Diagnosis Date  . ALLERGIC RHINITIS   . ANEMIA-NOS   . ASTHMA   . Carpal tunnel syndrome   . COLONIC POLYPS, HX OF   . DIABETES MELLITUS, TYPE II    diet controlled  . GERD   . HYPERLIPIDEMIA   . HYPERTENSION   . OSTEOPENIA   . Psoriasis    severe, began soriatane 01/2012  . Rectal fissure   . Scoliosis     Past Surgical History:  Procedure Laterality Date  . benign rectal growth  2004   removed by Dr. Zella Richer  . BREAST SURGERY  1988   biopsy  . CESAREAN SECTION      Social History   Social History  . Marital status: Single    Spouse name: N/A  . Number of children: N/A  . Years of education: N/A   Social History Main Topics  . Smoking status: Former Smoker    Quit date: 02/14/1974  . Smokeless tobacco: None  . Alcohol use No  . Drug use: No  . Sexual activity: Not Asked   Other Topics  Concern  . None   Social History Narrative  . None    Family History  Problem Relation Age of Onset  . Lung cancer Father   . Arthritis Other     Parents  . Asthma Other     parent, other relative  . Breast cancer Other     other relative  . Hypertension Other     parent, other relative  . Heart disease Other  parent, other relative    Review of Systems  Constitutional: Negative for chills, diaphoresis and fever.  HENT: Negative for hearing loss and tinnitus.   Eyes: Negative for visual disturbance.  Respiratory: Positive for cough (still coughing from recent bronchitis - still productive of discolored phlegm). Negative for shortness of breath and wheezing.   Cardiovascular: Negative for chest pain, palpitations and leg swelling.  Gastrointestinal: Positive for anal bleeding (anal fissure, recurrent). Negative for abdominal pain, constipation and diarrhea.  Genitourinary: Negative for dysuria and hematuria.  Musculoskeletal: Positive for back pain (scoliosis).  Skin: Negative for color change and rash.  Neurological: Positive for headaches. Negative for light-headedness.  Psychiatric/Behavioral: Negative for dysphoric mood. The patient is not nervous/anxious.        Objective:   Vitals:   10/28/15 0801  BP: (!) 200/106  Pulse: 79  Resp: 16  Temp: 98.7 F (37.1 C)   Filed Weights   10/28/15 0801  Weight: 141 lb (64 kg)   Body mass index is 25.79 kg/m.   Physical Exam Constitutional: She appears well-developed and well-nourished. No distress.  HENT:  Head: Normocephalic and atraumatic.  Right Ear: External ear normal. Normal ear canal and TM Left Ear: External ear normal.  Normal ear canal and TM Mouth/Throat: Oropharynx is clear and moist.  Eyes: Conjunctivae and EOM are normal.  Neck: Neck supple. No tracheal deviation present. No thyromegaly present.  No carotid bruit  Cardiovascular: Normal rate, regular rhythm and normal heart sounds.   No murmur  heard.  No edema. Pulmonary/Chest: Effort normal and breath sounds normal. No respiratory distress. She has no wheezes. She has no rales.  Breast: deferred to Gyn Abdominal: Soft. She exhibits no distension. There is no tenderness.  Lymphadenopathy: She has no cervical adenopathy.  Skin: Skin is warm and dry. She is not diaphoretic. small, mild bruising on leg, hand and neck Psychiatric: She has a normal mood and affect. Her behavior is normal.       Assessment & Plan:   Wellness Exam: Immunizations flu vaccine today, other vaccines up to date Colonoscopy  Up to date  Mammogram  Up to date  Dexa - due -- deferred -  Will discuss in 6 months Gyn - no longer sees gyn Eye exam   Up to date  Hearing loss  none Memory concerns/difficulties - some difficulty recall and forgetting where she places things Independent of ADLs  - fully Stressed the importance of regular exercise   Patient received copy of preventative screening tests/immunizations recommended for the next 5-10 years.  Physical exam: Screening blood work Immunizations - flu vaccine today, other vaccines up to date Colonoscopy  Up to date  Mammogram   Up to date  20 - no longer seeing gyn Dexa - last done 2014 - osteopenia - due now - last done at [place] - deferred Eye exams  Up to date  Exercise - not currently exercising, but typically walks, stressed regular exercise Weight - overweight - will work on weight loss Skin  - none, sees derm Substance abuse  - none  Increased bruising likely related to enbrel and supplements  See Problem List for Assessment and Plan of chronic medical problems.  F/u in 6 months

## 2015-10-28 NOTE — Assessment & Plan Note (Signed)
Requires daily medication - symptomatic when missing one dose of medication GERD controlled Continue daily medication

## 2015-10-28 NOTE — Assessment & Plan Note (Addendum)
dexa due Will defer until 6 months from now Calcium and vitamin d daily Stressed regular exercise

## 2015-10-28 NOTE — Patient Instructions (Addendum)
[name] , Thank you for taking time to come for your Medicare Wellness Visit. I appreciate your ongoing commitment to your health goals. Please review the following plan we discussed and let me know if I can assist you in the future.   These are the goals we discussed: Goals    Work on weight loss, improve diet      This is a list of the screening recommended for you and due dates:  Health Maintenance  Topic Date Due  . DEXA scan (bone density measurement)  03/09/2014  . Complete foot exam   Done today  . Flu Shot  Given today  . Hemoglobin A1C  10/23/2015  . Eye exam for diabetics  10/30/2015  . Colon Cancer Screening  11/14/2016  . Mammogram  04/07/2017  . Tetanus Vaccine  06/02/2024  . Shingles Vaccine  Addressed  .  Hepatitis C: One time screening is recommended by Center for Disease Control  (CDC) for  adults born from [birth year] through 1967.   Completed  . Pneumonia vaccines  Completed     Test(s) ordered today. Your results will be released to [facility] (or called to you) after review, usually within 72hours after test completion. If any changes need to be made, you will be notified at that same time.  All other Health Maintenance issues reviewed.   All recommended immunizations and age-appropriate screenings are up-to-date or discussed.  Flu vaccine administered today.   Medications reviewed and updated.  Changes include adding amlodipine for your blood pressure.  Your prescription(s) have been submitted to your pharmacy. Please take as directed and contact our office if you believe you are having problem(s) with the medication(s).   Please followup in 6 months  Health Maintenance, Female Adopting a healthy lifestyle and getting preventive care can go a long way to promote health and wellness. Talk with your health care provider about what schedule of regular examinations is right for you. This is a good chance for you to check in with your provider about disease  prevention and staying healthy. In between checkups, there are plenty of things you can do on your own. Experts have done a lot of research about which lifestyle changes and preventive measures are most likely to keep you healthy. Ask your health care provider for more information. WEIGHT AND DIET  Eat a healthy diet  Be sure to include plenty of vegetables, fruits, low-fat dairy products, and lean protein.  Do not eat a lot of foods high in solid fats, added sugars, or salt.  Get regular exercise. This is one of the most important things you can do for your health.  Most adults should exercise for at least 150 minutes each week. The exercise should increase your heart rate and make you sweat (moderate-intensity exercise).  Most adults should also do strengthening exercises at least twice a week. This is in addition to the moderate-intensity exercise.  Maintain a healthy weight  Body mass index (BMI) is a measurement that can be used to identify possible weight problems. It estimates body fat based on height and weight. Your health care provider can help determine your BMI and help you achieve or maintain a healthy weight.  For females 37 years of age and older:   A BMI below 18.5 is considered underweight.  A BMI of 18.5 to 24.9 is normal.  A BMI of 25 to 29.9 is considered overweight.  A BMI of 30 and above is considered obese.  Watch levels  of cholesterol and blood lipids  You should start having your blood tested for lipids and cholesterol at 72 years of age, then have this test every 5 years.  You may need to have your cholesterol levels checked more often if:  Your lipid or cholesterol levels are high.  You are older than 72 years of age.  You are at high risk for heart disease.  CANCER SCREENING   Lung Cancer  Lung cancer screening is recommended for adults 12-45 years old who are at high risk for lung cancer because of a history of smoking.  A yearly low-dose  CT scan of the lungs is recommended for people who:  Currently smoke.  Have quit within the past 15 years.  Have at least a 30-pack-year history of smoking. A pack year is smoking an average of one pack of cigarettes a day for 1 year.  Yearly screening should continue until it has been 15 years since you quit.  Yearly screening should stop if you develop a health problem that would prevent you from having lung cancer treatment.  Breast Cancer  Practice breast self-awareness. This means understanding how your breasts normally appear and feel.  It also means doing regular breast self-exams. Let your health care provider know about any changes, no matter how small.  If you are in your 20s or 30s, you should have a clinical breast exam (CBE) by a health care provider every 1-3 years as part of a regular health exam.  If you are 62 or older, have a CBE every year. Also consider having a breast X-ray (mammogram) every year.  If you have a family history of breast cancer, talk to your health care provider about genetic screening.  If you are at high risk for breast cancer, talk to your health care provider about having an MRI and a mammogram every year.  Breast cancer gene (BRCA) assessment is recommended for women who have family members with BRCA-related cancers. BRCA-related cancers include:  Breast.  Ovarian.  Tubal.  Peritoneal cancers.  Results of the assessment will determine the need for genetic counseling and BRCA1 and BRCA2 testing. Cervical Cancer Your health care provider may recommend that you be screened regularly for cancer of the pelvic organs (ovaries, uterus, and vagina). This screening involves a pelvic examination, including checking for microscopic changes to the surface of your cervix (Pap test). You may be encouraged to have this screening done every 3 years, beginning at age 57.  For women ages 70-65, health care providers may recommend pelvic exams and Pap  testing every 3 years, or they may recommend the Pap and pelvic exam, combined with testing for human papilloma virus (HPV), every 5 years. Some types of HPV increase your risk of cervical cancer. Testing for HPV may also be done on women of any age with unclear Pap test results.  Other health care providers may not recommend any screening for nonpregnant women who are considered low risk for pelvic cancer and who do not have symptoms. Ask your health care provider if a screening pelvic exam is right for you.  If you have had past treatment for cervical cancer or a condition that could lead to cancer, you need Pap tests and screening for cancer for at least 20 years after your treatment. If Pap tests have been discontinued, your risk factors (such as having a new sexual partner) need to be reassessed to determine if screening should resume. Some women have medical problems that increase the  chance of getting cervical cancer. In these cases, your health care provider may recommend more frequent screening and Pap tests. Colorectal Cancer  This type of cancer can be detected and often prevented.  Routine colorectal cancer screening usually begins at 72 years of age and continues through 72 years of age.  Your health care provider may recommend screening at an earlier age if you have risk factors for colon cancer.  Your health care provider may also recommend using home test kits to check for hidden blood in the stool.  A small camera at the end of a tube can be used to examine your colon directly (sigmoidoscopy or colonoscopy). This is done to check for the earliest forms of colorectal cancer.  Routine screening usually begins at age 89.  Direct examination of the colon should be repeated every 5-10 years through 72 years of age. However, you may need to be screened more often if early forms of precancerous polyps or small growths are found. Skin Cancer  Check your skin from head to toe  regularly.  Tell your health care provider about any new moles or changes in moles, especially if there is a change in a mole's shape or color.  Also tell your health care provider if you have a mole that is larger than the size of a pencil eraser.  Always use sunscreen. Apply sunscreen liberally and repeatedly throughout the day.  Protect yourself by wearing long sleeves, pants, a wide-brimmed hat, and sunglasses whenever you are outside. HEART DISEASE, DIABETES, AND HIGH BLOOD PRESSURE   High blood pressure causes heart disease and increases the risk of stroke. High blood pressure is more likely to develop in:  People who have blood pressure in the high end of the normal range (130-139/85-89 mm Hg).  People who are overweight or obese.  People who are African American.  If you are 71-36 years of age, have your blood pressure checked every 3-5 years. If you are 38 years of age or older, have your blood pressure checked every year. You should have your blood pressure measured twice--once when you are at a hospital or clinic, and once when you are not at a hospital or clinic. Record the average of the two measurements. To check your blood pressure when you are not at a hospital or clinic, you can use:  An automated blood pressure machine at a pharmacy.  A home blood pressure monitor.  If you are between 33 years and 5 years old, ask your health care provider if you should take aspirin to prevent strokes.  Have regular diabetes screenings. This involves taking a blood sample to check your fasting blood sugar level.  If you are at a normal weight and have a low risk for diabetes, have this test once every three years after 72 years of age.  If you are overweight and have a high risk for diabetes, consider being tested at a younger age or more often. PREVENTING INFECTION  Hepatitis B  If you have a higher risk for hepatitis B, you should be screened for this virus. You are considered  at high risk for hepatitis B if:  You were born in a country where hepatitis B is common. Ask your health care provider which countries are considered high risk.  Your parents were born in a high-risk country, and you have not been immunized against hepatitis B (hepatitis B vaccine).  You have HIV or AIDS.  You use needles to inject street drugs.  You live with someone who has hepatitis B.  You have had sex with someone who has hepatitis B.  You get hemodialysis treatment.  You take certain medicines for conditions, including cancer, organ transplantation, and autoimmune conditions. Hepatitis C  Blood testing is recommended for:  Everyone born from [birth year] through 1967.  Anyone with known risk factors for hepatitis C. Sexually transmitted infections (STIs)  You should be screened for sexually transmitted infections (STIs) including gonorrhea and chlamydia if:  You are sexually active and are younger than 72 years of age.  You are older than 72 years of age and your health care provider tells you that you are at risk for this type of infection.  Your sexual activity has changed since you were last screened and you are at an increased risk for chlamydia or gonorrhea. Ask your health care provider if you are at risk.  If you do not have HIV, but are at risk, it may be recommended that you take a prescription medicine daily to prevent HIV infection. This is called pre-exposure prophylaxis (PrEP). You are considered at risk if:  You are sexually active and do not regularly use condoms or know the HIV status of your partner(s).  You take drugs by injection.  You are sexually active with a partner who has HIV. Talk with your health care provider about whether you are at high risk of being infected with HIV. If you choose to begin PrEP, you should first be tested for HIV. You should then be tested every 3 months for as long as you are taking PrEP.  PREGNANCY   If you are  premenopausal and you may become pregnant, ask your health care provider about preconception counseling.  If you may become pregnant, take 400 to 800 micrograms (mcg) of folic acid every day.  If you want to prevent pregnancy, talk to your health care provider about birth control (contraception). OSTEOPOROSIS AND MENOPAUSE   Osteoporosis is a disease in which the bones lose minerals and strength with aging. This can result in serious bone fractures. Your risk for osteoporosis can be identified using a bone density scan.  If you are 3 years of age or older, or if you are at risk for osteoporosis and fractures, ask your health care provider if you should be screened.  Ask your health care provider whether you should take a calcium or vitamin D supplement to lower your risk for osteoporosis.  Menopause may have certain physical symptoms and risks.  Hormone replacement therapy may reduce some of these symptoms and risks. Talk to your health care provider about whether hormone replacement therapy is right for you.  HOME CARE INSTRUCTIONS   Schedule regular health, dental, and eye exams.  Stay current with your immunizations.   Do not use any tobacco products including cigarettes, chewing tobacco, or electronic cigarettes.  If you are pregnant, do not drink alcohol.  If you are breastfeeding, limit how much and how often you drink alcohol.  Limit alcohol intake to no more than 1 drink per day for nonpregnant women. One drink equals 12 ounces of beer, 5 ounces of wine, or 1 ounces of hard liquor.  Do not use street drugs.  Do not share needles.  Ask your health care provider for help if you need support or information about quitting drugs.  Tell your health care provider if you often feel depressed.  Tell your health care provider if you have ever been abused or do not feel safe  at home.   This information is not intended to replace advice given to you by your health care  provider. Make sure you discuss any questions you have with your health care provider.   Document Released: 08/16/2010 Document Revised: 02/21/2014 Document Reviewed: 01/02/2013 Elsevier Interactive Patient Education [employer].

## 2015-10-28 NOTE — Progress Notes (Signed)
Pre visit review using our clinic review tool, if applicable. No additional management support is needed unless otherwise documented below in the visit note. 

## 2015-10-28 NOTE — Assessment & Plan Note (Signed)
Management per dermatology at [facility]

## 2015-10-28 NOTE — Assessment & Plan Note (Signed)
Has some white coat htn BP at home controlled, but occasionally high -- she feels it is getting higher and spiking more often Continue benicar 20 mg daily Add amlodipine 2.5 mg daily She will continue to monitor closely at home

## 2015-10-28 NOTE — Assessment & Plan Note (Signed)
No symptoms Not using any inhalers

## 2015-10-30 ENCOUNTER — Encounter: Payer: Self-pay | Admitting: Emergency Medicine

## 2015-12-18 ENCOUNTER — Emergency Department (HOSPITAL_COMMUNITY): Payer: Medicare Other

## 2015-12-18 ENCOUNTER — Emergency Department (HOSPITAL_COMMUNITY)
Admission: EM | Admit: 2015-12-18 | Discharge: 2015-12-19 | Disposition: A | Discharge status: Home / Self Care | Payer: Medicare Other | Attending: Emergency Medicine | Admitting: Emergency Medicine

## 2015-12-18 ENCOUNTER — Encounter (HOSPITAL_COMMUNITY): Payer: Self-pay

## 2015-12-18 DIAGNOSIS — R42 Dizziness and giddiness: Secondary | ICD-10-CM | POA: Diagnosis not present

## 2015-12-18 DIAGNOSIS — J45909 Unspecified asthma, uncomplicated: Secondary | ICD-10-CM | POA: Insufficient documentation

## 2015-12-18 DIAGNOSIS — R110 Nausea: Secondary | ICD-10-CM | POA: Insufficient documentation

## 2015-12-18 DIAGNOSIS — Z87891 Personal history of nicotine dependence: Secondary | ICD-10-CM | POA: Diagnosis not present

## 2015-12-18 DIAGNOSIS — I10 Essential (primary) hypertension: Secondary | ICD-10-CM | POA: Insufficient documentation

## 2015-12-18 DIAGNOSIS — R791 Abnormal coagulation profile: Secondary | ICD-10-CM | POA: Insufficient documentation

## 2015-12-18 DIAGNOSIS — E119 Type 2 diabetes mellitus without complications: Secondary | ICD-10-CM | POA: Diagnosis not present

## 2015-12-18 LAB — CBC
HCT: 39.3 % (ref 36.0–46.0)
Hemoglobin: 13.1 g/dL (ref 12.0–15.0)
MCH: 31 pg (ref 26.0–34.0)
MCHC: 33.3 g/dL (ref 30.0–36.0)
MCV: 93.1 fL (ref 78.0–100.0)
Platelets: 294 10*3/uL (ref 150–400)
RBC: 4.22 MIL/uL (ref 3.87–5.11)
RDW: 14 % (ref 11.5–15.5)
WBC: 9.6 10*3/uL (ref 4.0–10.5)

## 2015-12-18 LAB — I-STAT CHEM 8, ED
BUN: 14 mg/dL (ref 6–20)
Calcium, Ion: 1.13 mmol/L — ABNORMAL LOW (ref 1.15–1.40)
Chloride: 103 mmol/L (ref 101–111)
Creatinine, Ser: 0.9 mg/dL (ref 0.44–1.00)
Glucose, Bld: 113 mg/dL — ABNORMAL HIGH (ref 65–99)
HCT: 41 % (ref 36.0–46.0)
Hemoglobin: 13.9 g/dL (ref 12.0–15.0)
Potassium: 4 mmol/L (ref 3.5–5.1)
Sodium: 139 mmol/L (ref 135–145)
TCO2: 24 mmol/L (ref 0–100)

## 2015-12-18 LAB — COMPREHENSIVE METABOLIC PANEL
ALT: 43 U/L (ref 14–54)
AST: 35 U/L (ref 15–41)
Albumin: 3.9 g/dL (ref 3.5–5.0)
Alkaline Phosphatase: 70 U/L (ref 38–126)
Anion gap: 11 (ref 5–15)
BUN: 13 mg/dL (ref 6–20)
CO2: 25 mmol/L (ref 22–32)
Calcium: 10 mg/dL (ref 8.9–10.3)
Chloride: 102 mmol/L (ref 101–111)
Creatinine, Ser: 0.94 mg/dL (ref 0.44–1.00)
GFR calc Af Amer: >60 mL/min (ref >60)
GFR calc non Af Amer: 59 mL/min — ABNORMAL LOW (ref >60)
Glucose, Bld: 116 mg/dL — ABNORMAL HIGH (ref 65–99)
Potassium: 4.1 mmol/L (ref 3.5–5.1)
Sodium: 138 mmol/L (ref 135–145)
Total Bilirubin: 1 mg/dL (ref 0.3–1.2)
Total Protein: 7.4 g/dL (ref 6.5–8.1)

## 2015-12-18 LAB — APTT: aPTT: 27 seconds (ref 24–36)

## 2015-12-18 LAB — DIFFERENTIAL
Basophils Absolute: 0.1 10*3/uL (ref 0.0–0.1)
Basophils Relative: 1 %
Eosinophils Absolute: 0.3 10*3/uL (ref 0.0–0.7)
Eosinophils Relative: 3 %
Lymphocytes Relative: 31 %
Lymphs Abs: 3 10*3/uL (ref 0.7–4.0)
Monocytes Absolute: 0.6 10*3/uL (ref 0.1–1.0)
Monocytes Relative: 6 %
Neutro Abs: 5.6 10*3/uL (ref 1.7–7.7)
Neutrophils Relative %: 59 %

## 2015-12-18 LAB — I-STAT TROPONIN, ED: Troponin i, poc: 0 ng/mL (ref 0.00–0.08)

## 2015-12-18 LAB — PROTIME-INR
INR: 0.92
Prothrombin Time: 12.3 seconds (ref 11.4–15.2)

## 2015-12-18 MED ORDER — ONDANSETRON 4 MG PO TBDP
ORAL_TABLET | ORAL | Status: AC
  Filled 2015-12-18: qty 1

## 2015-12-18 MED ORDER — ONDANSETRON 4 MG PO TBDP
4.0000 mg | ORAL_TABLET | Freq: Once | ORAL | Status: AC | PRN
  Administered 2015-12-18: 4 mg via ORAL

## 2015-12-18 NOTE — ED Notes (Signed)
RN called to ensure CT ready for patient

## 2015-12-18 NOTE — ED Provider Notes (Signed)
[facility] DEPT Provider Note   CSN: [id number] Arrival date & time: 12/18/15  1740  History   Chief Complaint Chief Complaint  Patient presents with  . Dizziness  . Nausea   HPI [name] is a 70 y.o. female.   Dizziness  Quality:  Imbalance and lightheadedness Severity:  Moderate Onset quality:  Sudden Duration:  4 hours Timing:  Constant Progression:  Worsening Chronicity:  New Context: bending over and head movement   Context: not with inactivity   Associated symptoms: nausea and vomiting   Associated symptoms: no chest pain, no headaches, no shortness of breath, no syncope and no vision changes   Risk factors: no hx of stroke     Past Medical History:  Diagnosis Date  . ALLERGIC RHINITIS   . ANEMIA-NOS   . ASTHMA   . Carpal tunnel syndrome   . COLONIC POLYPS, HX OF   . DIABETES MELLITUS, TYPE II    diet controlled  . GERD   . HYPERLIPIDEMIA   . HYPERTENSION   . OSTEOPENIA   . Psoriasis    severe, began soriatane 01/2012  . Rectal fissure   . Scoliosis     Patient Active Problem List   Diagnosis Date Noted  . Bilateral carotid artery disease, Mild 05/31/2015  . Thyroid nodule 05/31/2015  . Psoriasis   . Scoliosis   . Diabetes type 2, controlled ([place]) 06/03/2008  . Dyslipidemia 06/03/2008  . CARPAL TUNNEL SYNDROME 06/03/2008  . Essential hypertension 06/03/2008  . ALLERGIC RHINITIS 06/03/2008  . Asthma 06/03/2008  . GERD 06/03/2008  . Osteopenia 06/03/2008  . COLONIC POLYPS, HX OF 06/03/2008    Past Surgical History:  Procedure Laterality Date  . benign rectal growth  2004   removed by Dr. Zella Richer  . BREAST SURGERY  1988   biopsy  . CESAREAN SECTION      OB History    No data available       Home Medications    Prior to Admission medications   Medication Sig Start Date End Date Taking? Authorizing Provider  amLODipine (NORVASC) 2.5 MG tablet Take 1 tablet (2.5 mg total) by mouth daily. 10/28/15   [name],  MD  Azelaic Acid (FINACEA) 15 % cream Apply topically daily. After skin is thoroughly washed and patted dry, gently but thoroughly massage a thin film of azelaic acid cream into the affected area once daily, in the morning and evening.    Historical Provider, MD  clobetasol (TEMOVATE) 0.05 % external solution Apply topically 2 (two) times daily.      Historical Provider, MD  doxylamine, Sleep, (UNISOM) 25 MG tablet Take 15 mg by mouth at bedtime as needed.     Historical Provider, MD  ENBREL SURECLICK 50 MG/ML injection Once a week 12/05/12   Historical Provider, MD  ezetimibe (ZETIA) 10 MG tablet Take 1 tablet (10 mg total) by mouth daily. 04/24/15   [name], MD  glucose blood (ONE TOUCH ULTRA TEST) test strip Use test strip to check blood sugar at least 3 times 10/28/15   [name], MD  halobetasol (ULTRAVATE) 0.05 % cream Apply topically 3 (three) times a week.      Historical Provider, MD  Lancets [facility] ULTRASOFT) lancets Use lancet to test blood sugar at least twice a day or as directed by your physician. 10/28/15   [name], MD  loratadine (CLARITIN REDITABS) 10 MG dissolvable tablet Take 10 mg by mouth every other day as  needed for allergies.    Historical Provider, MD  Multiple Vitamin (MULTIVITAMIN) tablet Take 1 tablet by mouth daily.      Historical Provider, MD  NON FORMULARY [place] Tea 2-3 cups a day    Historical Provider, MD  olmesartan (BENICAR) 20 MG tablet Take 1 tablet (20 mg total) by mouth daily. 05/27/15   [name], MD  omeprazole (PRILOSEC) 20 MG capsule Take 20 mg by mouth daily. OTC    Historical Provider, MD  oxymetazoline (AFRIN 12 HOUR) 0.05 % nasal spray Place 1 spray into the nose daily.      Historical Provider, MD    Family History Family History  Problem Relation Age of Onset  . Lung cancer Father   . Arthritis Other     Parents  . Asthma Other     parent, other relative  . Breast cancer Other     other relative  . Hypertension Other       parent, other relative  . Heart disease Other     parent, other relative    Social History Social History  Substance Use Topics  . Smoking status: Former Smoker    Quit date: 02/14/1974  . Smokeless tobacco: Not on file  . Alcohol use No     Allergies   Clarithromycin; Codeine; Statins; and Tetracycline   Review of Systems Review of Systems  Respiratory: Negative for shortness of breath.   Cardiovascular: Negative for chest pain and syncope.  Gastrointestinal: Positive for nausea and vomiting.  Neurological: Positive for dizziness. Negative for headaches.  All other systems reviewed and are negative.  Physical Exam Updated Vital Signs BP (!) 189/104   Pulse 79   Temp 98.1 F (36.7 C) (Oral)   Resp 14   Ht '5\' 1"'$  (1.549 m)   Wt 64 kg   SpO2 100%   BMI 26.64 kg/m   Physical Exam  Constitutional: She is oriented to person, place, and time. She appears well-developed and well-nourished. No distress.  HENT:  Head: Normocephalic and atraumatic.  Eyes: Pupils are equal, round, and reactive to light.  Neck: Normal range of motion.  Cardiovascular: Normal rate.   Pulmonary/Chest: Effort normal. No respiratory distress.  Abdominal: Soft.  Musculoskeletal: Normal range of motion.  Neurological: She is alert and oriented to person, place, and time. No cranial nerve deficit. She exhibits normal muscle tone. Coordination normal.  5/5 bilateral intrinsic hand grip, bicep flexion, tricep extension.   5/5 plantar flexion, dorsiflexion and hip flexion.   No nystagmus noted.   Normal finger to nose, no dysmetria  Unsteady gait  Skin: Skin is warm. She is not diaphoretic.  Nursing note and vitals reviewed.  ED Treatments / Results  Labs (all labs ordered are listed, but only abnormal results are displayed) Labs Reviewed  COMPREHENSIVE METABOLIC PANEL - Abnormal; Notable for the following:       Result Value   Glucose, Bld 116 (*)    GFR calc non Af Amer 59 (*)     All other components within normal limits  I-STAT CHEM 8, ED - Abnormal; Notable for the following:    Glucose, Bld 113 (*)    Calcium, Ion 1.13 (*)    All other components within normal limits  CBC  PROTIME-INR  APTT  DIFFERENTIAL  Randolm Idol, ED  CBG MONITORING, ED    EKG  EKG Interpretation  Date/Time:  Friday December 18 2015 18:04:12 EDT Ventricular Rate:  77 PR Interval:  150 QRS  Duration: 82 QT Interval:  382 QTC Calculation: 432 R Axis:   -9 Text Interpretation:  Normal sinus rhythm Moderate voltage criteria for LVH, may be normal variant Inferior infarct , age undetermined Anterior infarct , age undetermined Abnormal ECG Confirmed by Reather Converse MD, JOSHUA [phone]) on 12/18/2015 7:04:40 PM      Radiology No results found.  Procedures Procedures (including critical care time)  Medications Ordered in ED Medications  ondansetron (ZOFRAN-ODT) disintegrating tablet 4 mg (4 mg Oral Given 12/18/15 1803)     Initial Impression / Assessment and Plan / ED Course  I have reviewed the triage vital signs and the nursing notes.  Pertinent labs & imaging results that were available during my care of the patient were reviewed by me and considered in my medical decision making (see chart for details).  Clinical Course   Patient is a 70 year old female who presents versus primary today with sudden onset of dizziness, lightheadedness, imbalance and nausea. This started at 4 PM today after bending down to pick up something from her car.  Patient states that this is worse with head movement.  No significant neurological deficits with the exception of unsteady gait. Well appearing. Patient also has latency and worsening symptoms with Marye Round but no significant nystagmus.   Patient's signs and symptoms are more consistent with vertigo however gait instability, we'll obtain CT and if negative may need MRI no movement in symptoms. Possible hypertension emergency but patient's  BP has significantly improved since initial presentation and much less likely.   CT head negative. Discussed with patient and will obtain MRI.   MRI pending. If negative, believe her widened gait and instability to be secondary to vertigo and will discharge home with meclizine and zofran.  MRI negative. Continued improvement in symptomatology. Discussed meclizine in elderly patients.  Patient in agreement with plan. Patient discharged home in good health.   Final Clinical Impressions(s) / ED Diagnoses   Final diagnoses:  Vertigo  Nausea     Roberto Scales, MD 12/19/15 1595    Elnora Morrison, MD 12/25/15 1102

## 2015-12-18 NOTE — ED Notes (Signed)
Caitlin Ehrich, MD resident at bedside

## 2015-12-18 NOTE — ED Triage Notes (Signed)
Pt presents for evaluation of nausea/dizziness starting today. Pt. States dizziness started suddenly while reaching to get something out of car around 1600. Pt. States dizziness has not stopped since. Pt. Has intermittent vomiting. Pt. States nothing improves the symptoms, states she was hypertensive at home.

## 2015-12-18 NOTE — ED Notes (Signed)
RN called CT to ask that she go over for exam

## 2015-12-19 MED ORDER — ONDANSETRON 4 MG PO TBDP
4.0000 mg | ORAL_TABLET | Freq: Once | ORAL | Status: AC
  Administered 2015-12-19: 4 mg via ORAL
  Filled 2015-12-19: qty 1

## 2015-12-19 MED ORDER — ONDANSETRON 4 MG PO TBDP: 4.0000 mg | ORAL_TABLET | Freq: Three times a day (TID) | ORAL | 0 refills | Status: DC | PRN

## 2015-12-19 MED ORDER — MECLIZINE HCL 12.5 MG PO TABS: 12.5000 mg | ORAL_TABLET | Freq: Three times a day (TID) | ORAL | 0 refills | Status: DC | PRN

## 2015-12-24 ENCOUNTER — Ambulatory Visit (INDEPENDENT_AMBULATORY_CARE_PROVIDER_SITE_OTHER): Payer: Medicare Other | Admitting: Internal Medicine

## 2015-12-24 ENCOUNTER — Other Ambulatory Visit (INDEPENDENT_AMBULATORY_CARE_PROVIDER_SITE_OTHER): Payer: Medicare Other

## 2015-12-24 ENCOUNTER — Encounter: Payer: Self-pay | Admitting: Internal Medicine

## 2015-12-24 VITALS — BP 132/76 | HR 88 | Temp 98.5°F | Resp 16 | Wt 142.0 lb

## 2015-12-24 DIAGNOSIS — H8113 Benign paroxysmal vertigo, bilateral: Secondary | ICD-10-CM

## 2015-12-24 DIAGNOSIS — E041 Nontoxic single thyroid nodule: Secondary | ICD-10-CM

## 2015-12-24 DIAGNOSIS — R9431 Abnormal electrocardiogram [ECG] [EKG]: Secondary | ICD-10-CM

## 2015-12-24 DIAGNOSIS — R5383 Other fatigue: Secondary | ICD-10-CM | POA: Diagnosis not present

## 2015-12-24 DIAGNOSIS — R42 Dizziness and giddiness: Secondary | ICD-10-CM | POA: Insufficient documentation

## 2015-12-24 LAB — BRAIN NATRIURETIC PEPTIDE: Pro B Natriuretic peptide (BNP): 21 pg/mL (ref 0.0–100.0)

## 2015-12-24 LAB — CARDIAC PANEL
CK-MB: 3.9 ng/mL (ref 0.3–4.0)
Relative Index: 4.4 calc — ABNORMAL HIGH (ref 0.0–2.5)
Total CK: 88 U/L (ref 7–177)

## 2015-12-24 LAB — TROPONIN I: TNIDX: 0 ug/l (ref 0.00–0.06)

## 2015-12-24 MED ORDER — DIAZEPAM 2 MG PO TABS: 2.0000 mg | ORAL_TABLET | Freq: Four times a day (QID) | ORAL | 0 refills | Status: DC | PRN

## 2015-12-24 NOTE — Progress Notes (Signed)
Subjective:  Patient ID: [name], female    DOB: May 12, 1945  Age: 70 y.o. MRN: [id number]  CC: Fatigue  NEW TO ME  HPI [name] presents for an ER follow-up after a recent episode of abrupt and acute onset of vertigo and spinning sensation. Her symptoms have improved but are still present. She doesn't think Antivert helped much. She had an MRI that showed chronic microvascular changes but no acute pathology that would explain dizziness or vertigo.  She also complains of about a 6 week history of profound fatigue. She denies chest pain, dyspnea on exertion, palpitations, edema, or near-syncope. She had an abnormal EKG done about 3 years ago.  Outpatient Medications Prior to Visit  Medication Sig Dispense Refill  . Azelaic Acid (FINACEA) 15 % cream Apply topically daily. After skin is thoroughly washed and patted dry, gently but thoroughly massage a thin film of azelaic acid cream into the affected area once daily, in the morning and evening.    . clobetasol (TEMOVATE) 0.05 % external solution Apply 1 application topically at bedtime.     Marland Kitchen doxylamine, Sleep, (UNISOM) 25 MG tablet Take 15 mg by mouth at bedtime as needed for sleep.     Scarlette Shorts SURECLICK 50 MG/ML injection Inject into the skin once a week. Once a week sometimes on sundays or mondays. Patient unsure of dose    . glucose blood (ONE TOUCH ULTRA TEST) test strip Use test strip to check blood sugar at least 3 times 300 each 1  . halobetasol (ULTRAVATE) 0.05 % cream Apply 1 application topically daily.     Marland Kitchen ibuprofen (ADVIL,MOTRIN) 200 MG tablet Take 200 mg by mouth every 6 (six) hours as needed for mild pain.    . Lancets (ONETOUCH ULTRASOFT) lancets Use lancet to test blood sugar at least twice a day or as directed by your physician. 200 each 11  . loratadine (CLARITIN REDITABS) 10 MG dissolvable tablet Take 10 mg by mouth every other day as needed for allergies.    . Multiple Vitamin (MULTIVITAMIN) tablet  Take 1 tablet by mouth daily.      Marland Kitchen olmesartan (BENICAR) 20 MG tablet Take 1 tablet (20 mg total) by mouth daily. 30 tablet 11  . omeprazole (PRILOSEC) 20 MG capsule Take 20 mg by mouth daily. OTC    . oxymetazoline (AFRIN 12 HOUR) 0.05 % nasal spray Place 1 spray into the nose daily.      Marland Kitchen amLODipine (NORVASC) 2.5 MG tablet Take 1 tablet (2.5 mg total) by mouth daily. 30 tablet 3  . ezetimibe (ZETIA) 10 MG tablet Take 1 tablet (10 mg total) by mouth daily. (Patient not taking: Reported on 12/24/2015) 90 tablet 3  . meclizine (ANTIVERT) 12.5 MG tablet Take 1 tablet (12.5 mg total) by mouth 3 (three) times daily as needed for dizziness. 30 tablet 0  . ondansetron (ZOFRAN ODT) 4 MG disintegrating tablet Take 1 tablet (4 mg total) by mouth every 8 (eight) hours as needed for nausea or vomiting. 8 tablet 0   No facility-administered medications prior to visit.     ROS Review of Systems  Constitutional: Positive for fatigue. Negative for activity change, appetite change and unexpected weight change.  HENT: Negative.   Eyes: Negative for photophobia and visual disturbance.  Respiratory: Negative.  Negative for cough, choking, chest tightness, shortness of breath and stridor.   Cardiovascular: Negative.  Negative for chest pain, palpitations and leg swelling.  Gastrointestinal: Negative for abdominal pain,  constipation, diarrhea, nausea and vomiting.  Endocrine: Negative.   Genitourinary: Negative.   Musculoskeletal: Negative.  Negative for back pain, neck pain and neck stiffness.  Skin: Negative.   Allergic/Immunologic: Negative.   Neurological: Positive for dizziness. Negative for tremors, syncope, facial asymmetry, speech difficulty, weakness, light-headedness, numbness and headaches.  Hematological: Negative.  Negative for adenopathy. Does not bruise/bleed easily.  Psychiatric/Behavioral: Negative.     Objective:  BP 132/76   Pulse 88   Temp 98.5 F (36.9 C) (Oral)   Resp 16   Wt  142 lb (64.4 kg)   SpO2 99%   BMI 26.83 kg/m   BP Readings from Last 3 Encounters:  12/24/15 132/76  12/19/15 164/89  10/28/15 (!) 200/106    Wt Readings from Last 3 Encounters:  12/24/15 142 lb (64.4 kg)  12/18/15 141 lb (64 kg)  10/28/15 141 lb (64 kg)    Physical Exam  Constitutional: She is oriented to person, place, and time. No distress.  HENT:  Mouth/Throat: Oropharynx is clear and moist. No oropharyngeal exudate.  Eyes: Conjunctivae are normal. Right eye exhibits no discharge. Left eye exhibits no discharge. No scleral icterus.  Neck: Normal range of motion. Neck supple. No JVD present. No tracheal deviation present. No thyromegaly present.  Cardiovascular: Normal rate, regular rhythm, normal heart sounds and intact distal pulses.  Exam reveals no gallop and no friction rub.   No murmur heard. Pulmonary/Chest: Effort normal and breath sounds normal. No stridor. No respiratory distress. She has no wheezes. She has no rales. She exhibits no tenderness.  Abdominal: Soft. Bowel sounds are normal. She exhibits no distension and no mass. There is no tenderness. There is no rebound and no guarding.  Musculoskeletal: Normal range of motion. She exhibits no edema, tenderness or deformity.  Lymphadenopathy:    She has no cervical adenopathy.  Neurological: She is alert and oriented to person, place, and time. She has normal reflexes. She displays normal reflexes. No cranial nerve deficit. She exhibits normal muscle tone. Coordination normal.  Skin: Skin is warm and dry. No rash noted. She is not diaphoretic. No erythema. No pallor.  Psychiatric: She has a normal mood and affect. Her behavior is normal. Judgment and thought content normal.  Vitals reviewed.   Lab Results  Component Value Date   WBC 9.6 12/18/2015   HGB 13.9 12/18/2015   HCT 41.0 12/18/2015   PLT 294 12/18/2015   GLUCOSE 113 (H) 12/18/2015   CHOL 231 (H) 10/28/2015   TRIG 168.0 (H) 10/28/2015   HDL 56.50  10/28/2015   LDLDIRECT 166.0 06/22/2015   LDLCALC 141 (H) 10/28/2015   ALT 43 12/18/2015   AST 35 12/18/2015   NA 139 12/18/2015   K 4.0 12/18/2015   CL 103 12/18/2015   CREATININE 0.90 12/18/2015   BUN 14 12/18/2015   CO2 25 12/18/2015   TSH 2.54 12/24/2015   INR 0.92 12/18/2015   HGBA1C 6.2 10/28/2015   MICROALBUR <0.7 10/28/2015    Ct Head Wo Contrast  Result Date: 12/18/2015 CLINICAL DATA:  Dizziness EXAM: CT HEAD WITHOUT CONTRAST TECHNIQUE: Contiguous axial images were obtained from the base of the skull through the vertex without intravenous contrast. COMPARISON:  03/03/2009 FINDINGS: Brain: No evidence of acute infarction, hemorrhage, hydrocephalus, extra-axial collection or mass lesion/mass effect. Vascular: No hyperdense vessel or unexpected calcification. Skull: Normal. Negative for fracture or focal lesion. Sinuses/Orbits: No acute finding. Other: None. IMPRESSION: No acute abnormality noted. Electronically Signed   By: Linus Mako.D.  On: 12/18/2015 20:23   Mr Brain Wo Contrast  Result Date: 12/19/2015 CLINICAL DATA:  Nausea and dizziness.  Intermittent vomiting. EXAM: MRI HEAD WITHOUT CONTRAST TECHNIQUE: Multiplanar, multiecho pulse sequences of the brain and surrounding structures were obtained without intravenous contrast. COMPARISON:  Head CT 12/18/2015 FINDINGS: Brain: No acute infarct or intraparenchymal hemorrhage. The midline structures are normal. There is beginning confluent hyperintense T2-weighted signal within the periventricular and deep white matter, most often seen in the setting of chronic microvascular ischemia. No mass lesion or midline shift. No hydrocephalus or extra-axial fluid collection. Vascular: Major intracranial arterial and venous sinus flow voids are preserved. No evidence of chronic microhemorrhage or amyloid angiopathy. Skull and upper cervical spine: The visualized skull base, calvarium, upper cervical spine and extracranial soft tissues are  normal. Sinuses/Orbits: No fluid levels or advanced mucosal thickening. No mastoid effusion. Normal orbits. IMPRESSION: 1. Acute intracranial abnormality. 2. Chronic microvascular ischemia. Electronically Signed   By: Ulyses Jarred M.D.   On: 12/19/2015 00:31    Assessment & Plan:   Axel was seen today for fatigue.  Diagnoses and all orders for this visit:  Thyroid nodule  BPPV (benign paroxysmal positional vertigo), bilateral- she has not benefited much from [place] so I have asked her to stop taking it, will try low-dose Valium for symptom relief. If she doesn't get better soon then she agrees to be referred for vestibular rehabilitation -     diazepam (VALIUM) 2 MG tablet; Take 1 tablet (2 mg total) by mouth every 6 (six) hours as needed for anxiety.  Fatigue, unspecified type- her EKG is abnormal, her labs are negative for any secondary causes such as thyroid disease or hypercalcemia. See notes below for further evaluation. -     Thyroid Panel With TSH; Future -     Myocardial Perfusion Imaging; Future -     Troponin I; Future -     Cardiac panel; Future -     Brain natriuretic peptide; Future  Nonspecific abnormal electrocardiogram (ECG) (EKG)- an EKG done nearly 3 years ago showed Q waves and loss of voltage in lead 3 and aVF, the EKG recently done in the emergency room showed those changes in addition to Q waves and loss of voltage in V3, V4, and V5. These changes are new compared to the prior EKG. Her cardiac enzymes are normal today. I'm concerned her fatigue is an angina equivalent and have asked her to undergo a myocardial perfusion imaging test to screen for coronary artery disease and ischemia. -     Myocardial Perfusion Imaging; Future -     Troponin I; Future -     Cardiac panel; Future -     Brain natriuretic peptide; Future   I have discontinued Ms. Larrabee's ezetimibe, amLODipine, meclizine, and ondansetron. I am also having her start on diazepam. Additionally, I  am having her maintain her oxymetazoline, clobetasol, Azelaic Acid, halobetasol, multivitamin, omeprazole, doxylamine (Sleep), ENBREL SURECLICK, loratadine, olmesartan, glucose blood, onetouch ultrasoft, and ibuprofen.  Meds ordered this encounter  Medications  . diazepam (VALIUM) 2 MG tablet    Sig: Take 1 tablet (2 mg total) by mouth every 6 (six) hours as needed for anxiety.    Dispense:  45 tablet    Refill:  0     Follow-up: Return in about 4 weeks (around 01/21/2016).  Scarlette Calico, MD

## 2015-12-24 NOTE — Progress Notes (Signed)
Pre visit review using our clinic review tool, if applicable. No additional management support is needed unless otherwise documented below in the visit note. 

## 2015-12-24 NOTE — Patient Instructions (Signed)

## 2015-12-25 LAB — THYROID PANEL WITH TSH
Free Thyroxine Index: 2.3 (ref 1.4–3.8)
T3 Uptake: 26 % (ref 22–35)
T4, Total: 9 ug/dL (ref 4.5–12.0)
TSH: 2.54 mIU/L

## 2015-12-31 ENCOUNTER — Telehealth (HOSPITAL_COMMUNITY): Payer: Self-pay

## 2015-12-31 NOTE — Telephone Encounter (Signed)
Encounter complete. 

## 2016-01-05 ENCOUNTER — Ambulatory Visit (HOSPITAL_COMMUNITY)
Admission: RE | Admit: 2016-01-05 | Discharge: 2016-01-05 | Disposition: A | Discharge status: Home / Self Care | Payer: Medicare Other | Source: Ambulatory Visit | Attending: Cardiovascular Disease | Admitting: Cardiovascular Disease

## 2016-01-05 DIAGNOSIS — R5383 Other fatigue: Secondary | ICD-10-CM | POA: Diagnosis not present

## 2016-01-05 DIAGNOSIS — E041 Nontoxic single thyroid nodule: Secondary | ICD-10-CM | POA: Diagnosis not present

## 2016-01-05 DIAGNOSIS — R9431 Abnormal electrocardiogram [ECG] [EKG]: Secondary | ICD-10-CM

## 2016-01-05 DIAGNOSIS — Z8249 Family history of ischemic heart disease and other diseases of the circulatory system: Secondary | ICD-10-CM | POA: Insufficient documentation

## 2016-01-05 DIAGNOSIS — R42 Dizziness and giddiness: Secondary | ICD-10-CM | POA: Insufficient documentation

## 2016-01-05 DIAGNOSIS — I779 Disorder of arteries and arterioles, unspecified: Secondary | ICD-10-CM | POA: Insufficient documentation

## 2016-01-05 DIAGNOSIS — R0609 Other forms of dyspnea: Secondary | ICD-10-CM | POA: Insufficient documentation

## 2016-01-05 LAB — MYOCARDIAL PERFUSION IMAGING
LV dias vol: 5 mL (ref 46–106)
LV sys vol: 31 mL
Peak HR: 103 {beats}/min
Rest HR: 76 {beats}/min
SDS: 12
SRS: 0
SSS: 12
TID: 0.77

## 2016-01-05 MED ORDER — TECHNETIUM TC 99M TETROFOSMIN IV KIT
9.9000 | PACK | Freq: Once | INTRAVENOUS | Status: AC | PRN
  Administered 2016-01-05: 9.9 via INTRAVENOUS
  Filled 2016-01-05: qty 10

## 2016-01-05 MED ORDER — AMINOPHYLLINE 25 MG/ML IV SOLN
75.0000 mg | Freq: Once | INTRAVENOUS | Status: AC
  Administered 2016-01-05: 75 mg via INTRAVENOUS

## 2016-01-05 MED ORDER — REGADENOSON 0.4 MG/5ML IV SOLN
0.4000 mg | Freq: Once | INTRAVENOUS | Status: AC
  Administered 2016-01-05: 0.4 mg via INTRAVENOUS

## 2016-01-05 MED ORDER — TECHNETIUM TC 99M TETROFOSMIN IV KIT
31.9000 | PACK | Freq: Once | INTRAVENOUS | Status: AC | PRN
  Administered 2016-01-05: 31.9 via INTRAVENOUS
  Filled 2016-01-05: qty 32

## 2016-01-22 ENCOUNTER — Ambulatory Visit (INDEPENDENT_AMBULATORY_CARE_PROVIDER_SITE_OTHER): Payer: Medicare Other | Admitting: Internal Medicine

## 2016-01-22 VITALS — BP 146/90 | HR 97 | Temp 98.1°F | Resp 16 | Wt 143.0 lb

## 2016-01-22 DIAGNOSIS — I10 Essential (primary) hypertension: Secondary | ICD-10-CM | POA: Diagnosis not present

## 2016-01-22 DIAGNOSIS — E785 Hyperlipidemia, unspecified: Secondary | ICD-10-CM

## 2016-01-22 DIAGNOSIS — R9431 Abnormal electrocardiogram [ECG] [EKG]: Secondary | ICD-10-CM | POA: Diagnosis not present

## 2016-01-22 DIAGNOSIS — I517 Cardiomegaly: Secondary | ICD-10-CM

## 2016-01-22 DIAGNOSIS — R5383 Other fatigue: Secondary | ICD-10-CM | POA: Diagnosis not present

## 2016-01-22 MED ORDER — AMLODIPINE BESYLATE 2.5 MG PO TABS: 2.5000 mg | ORAL_TABLET | Freq: Two times a day (BID) | ORAL | 1 refills | Status: DC

## 2016-01-22 NOTE — Progress Notes (Signed)
Subjective:    Patient ID: [name], female    DOB: [birth year], 70 y.o.   MRN: [id number]  HPI The patient is here for follow up.  She had a nuclear stress test not long ago and was told that she had a stiff heart, but her heart function was normal. Since that time she has been very anxious and concerned that this was a death sentence. She had stopped the Zetia because she did not tolerate it and has restarted. She also stopped the amlodipine and has restarted that. She also started taking a baby aspirin daily.  She is concerned regarding her prior abnormal EKGs, the findings of her stress test in her overall prognosis.  She did not tolerate this at a daily basis because it caused increased bowel movements and possibly fatigue. She is tolerating on an every other day basis.  She has been monitoring her blood pressure at home and at times it is elevated. There has been several measures with diastolic pressure of around 90. Her systolic pressure varies from 110-140s.  She is taking both blood pressure medications once daily.  She has not been exercising because Dr. Ronnald Ramp told her she should not exercise. She was not instructed to restart exercise after the stress test and was unsure if she should or should not exercise.  Since the ED visit her balance has not been as good.  In last couple of weeks she also notes difficulty with thought process.  No obvious cause.  She has been very anxious about her diagnosis of a stiff heart.     Medications and allergies reviewed with patient and updated if appropriate.  Patient Active Problem List   Diagnosis Date Noted  . BPPV (benign paroxysmal positional vertigo), bilateral 12/24/2015  . Fatigue 12/24/2015  . Nonspecific abnormal electrocardiogram (ECG) (EKG) 12/24/2015  . Bilateral carotid artery disease, Mild 05/31/2015  . Thyroid nodule 05/31/2015  . Psoriasis   . Scoliosis   . Diabetes type 2, controlled ([place]) 06/03/2008  .  Dyslipidemia 06/03/2008  . CARPAL TUNNEL SYNDROME 06/03/2008  . Essential hypertension 06/03/2008  . ALLERGIC RHINITIS 06/03/2008  . Asthma 06/03/2008  . GERD 06/03/2008  . Osteopenia 06/03/2008  . COLONIC POLYPS, HX OF 06/03/2008    Current Outpatient Prescriptions on File Prior to Visit  Medication Sig Dispense Refill  . Azelaic Acid (FINACEA) 15 % cream Apply topically daily. After skin is thoroughly washed and patted dry, gently but thoroughly massage a thin film of azelaic acid cream into the affected area once daily, in the morning and evening.    . clobetasol (TEMOVATE) 0.05 % external solution Apply 1 application topically at bedtime.     . diazepam (VALIUM) 2 MG tablet Take 1 tablet (2 mg total) by mouth every 6 (six) hours as needed for anxiety. 45 tablet 0  . doxylamine, Sleep, (UNISOM) 25 MG tablet Take 15 mg by mouth at bedtime as needed for sleep.     Scarlette Shorts SURECLICK 50 MG/ML injection Inject into the skin once a week. Once a week sometimes on sundays or mondays. Patient unsure of dose    . glucose blood (ONE TOUCH ULTRA TEST) test strip Use test strip to check blood sugar at least 3 times 300 each 1  . halobetasol (ULTRAVATE) 0.05 % cream Apply 1 application topically daily.     Marland Kitchen ibuprofen (ADVIL,MOTRIN) 200 MG tablet Take 200 mg by mouth every 6 (six) hours as needed for mild pain.    Marland Kitchen  Lancets (ONETOUCH ULTRASOFT) lancets Use lancet to test blood sugar at least twice a day or as directed by your physician. 200 each 11  . loratadine (CLARITIN REDITABS) 10 MG dissolvable tablet Take 10 mg by mouth every other day as needed for allergies.    . Multiple Vitamin (MULTIVITAMIN) tablet Take 1 tablet by mouth daily.      Marland Kitchen olmesartan (BENICAR) 20 MG tablet Take 1 tablet (20 mg total) by mouth daily. 30 tablet 11  . omeprazole (PRILOSEC) 20 MG capsule Take 20 mg by mouth daily. OTC    . oxymetazoline (AFRIN 12 HOUR) 0.05 % nasal spray Place 1 spray into the nose daily.       No  current facility-administered medications on file prior to visit.     Past Medical History:  Diagnosis Date  . ALLERGIC RHINITIS   . ANEMIA-NOS   . ASTHMA   . Carpal tunnel syndrome   . COLONIC POLYPS, HX OF   . DIABETES MELLITUS, TYPE II    diet controlled  . GERD   . HYPERLIPIDEMIA   . HYPERTENSION   . OSTEOPENIA   . Psoriasis    severe, began soriatane 01/2012  . Rectal fissure   . Scoliosis     Past Surgical History:  Procedure Laterality Date  . benign rectal growth  2004   removed by Dr. Zella Richer  . BREAST SURGERY  1988   biopsy  . CESAREAN SECTION      Social History   Social History  . Marital status: Single    Spouse name: N/A  . Number of children: N/A  . Years of education: N/A   Social History Main Topics  . Smoking status: Former Smoker    Quit date: 02/14/1974  . Smokeless tobacco: Not on file  . Alcohol use No  . Drug use: No  . Sexual activity: Not on file   Other Topics Concern  . Not on file   Social History Narrative  . No narrative on file    Family History  Problem Relation Age of Onset  . Lung cancer Father   . Arthritis Other     Parents  . Asthma Other     parent, other relative  . Breast cancer Other     other relative  . Hypertension Other     parent, other relative  . Heart disease Other     parent, other relative    Review of Systems  Constitutional: Positive for fatigue (tires more easily). Negative for fever.  Respiratory: Negative for cough, shortness of breath and wheezing.   Cardiovascular: Positive for chest pain (occ tightness in chest). Negative for palpitations and leg swelling.  Neurological: Positive for dizziness (mild ) and headaches (occ). Negative for light-headedness.       Objective:   Vitals:   01/22/16 0946  BP: (!) 146/90  Pulse: 97  Resp: 16  Temp: 98.1 F (36.7 C)   Filed Weights   01/22/16 0946  Weight: 143 lb (64.9 kg)   Body mass index is 27.02 kg/m.   Physical Exam      Constitutional: Appears well-developed and well-nourished. No distress.  HENT:  Head: Normocephalic and atraumatic.  Neck: Neck supple. No tracheal deviation present. No thyromegaly present.  No cervical lymphadenopathy Cardiovascular: Normal rate, regular rhythm and normal heart sounds.   No murmur heard. No carotid bruit .  No edema Pulmonary/Chest: Effort normal and breath sounds normal. No respiratory distress. No has no wheezes. No  rales.  Skin: Skin is warm and dry. Not diaphoretic.  Psychiatric: Normal mood and affect. Behavior is normal.      Assessment & Plan:   Echocardiogram ordered-once the results are back we will get her in to see me within the next couple of days so we can review this in person  See Problem List for Assessment and Plan of chronic medical problems.

## 2016-01-22 NOTE — Progress Notes (Signed)
Pre visit review using our clinic review tool, if applicable. No additional management support is needed unless otherwise documented below in the visit note. 

## 2016-01-22 NOTE — Patient Instructions (Addendum)
   Medications reviewed and updated.  Changes include starting amlodipine 2.5 mg twice a day.  Your prescription(s) have been submitted to your pharmacy. Please take as directed and contact our office if you believe you are having problem(s) with the medication(s).   Please followup after Echo

## 2016-01-23 ENCOUNTER — Encounter: Payer: Self-pay | Admitting: Internal Medicine

## 2016-01-23 NOTE — Assessment & Plan Note (Signed)
Unsure of cause-include possibly be secondary to medication She was concerned it was related to her heart and reassured her this was not the case Restart regular exercise We'll evaluate fatigue further at her next visit

## 2016-01-23 NOTE — Assessment & Plan Note (Signed)
Intolerant of statins Taking Zetia, but does not tolerate it on a daily basis due to excessive bowel movements Continue every other day

## 2016-01-23 NOTE — Assessment & Plan Note (Signed)
Blood pressure not ideally controlled Continue Benicar 20 mg daily We'll try increasing amlodipine from 2.5 mg daily to twice daily Discussed possible side effects She will continue to monitor her blood pressure

## 2016-01-23 NOTE — Assessment & Plan Note (Signed)
Possible LVH on EKG-likely related to poorly controlled hypertension Will check 2-D echo for further evaluation of LVH and diastolic dysfunction

## 2016-01-23 NOTE — Assessment & Plan Note (Signed)
EKGs have been abnormal Nuclear stress test 12/2015 shows no evidence of ischemia Reassured her that the abnormal EKGs may not be accurate in her diagnosis of a history of myocardial infarction

## 2016-02-01 ENCOUNTER — Telehealth: Payer: Self-pay | Admitting: Internal Medicine

## 2016-02-01 ENCOUNTER — Other Ambulatory Visit: Payer: Self-pay

## 2016-02-01 ENCOUNTER — Ambulatory Visit (HOSPITAL_COMMUNITY): Payer: Medicare Other | Attending: Internal Medicine

## 2016-02-01 DIAGNOSIS — I2510 Atherosclerotic heart disease of native coronary artery without angina pectoris: Secondary | ICD-10-CM | POA: Diagnosis not present

## 2016-02-01 DIAGNOSIS — I517 Cardiomegaly: Secondary | ICD-10-CM

## 2016-02-01 DIAGNOSIS — I361 Nonrheumatic tricuspid (valve) insufficiency: Secondary | ICD-10-CM | POA: Insufficient documentation

## 2016-02-01 DIAGNOSIS — I501 Left ventricular failure: Secondary | ICD-10-CM | POA: Diagnosis not present

## 2016-02-01 DIAGNOSIS — I10 Essential (primary) hypertension: Secondary | ICD-10-CM | POA: Insufficient documentation

## 2016-02-01 NOTE — Telephone Encounter (Signed)
Patient called to check on EKG order. She has not heard anything and is worried with her dx given. Please follow up asap and call patient on both home and mobile # (which ever you are able to reach her at) to advise today.

## 2016-02-01 NOTE — Telephone Encounter (Signed)
[name] at [facility] will call pt to schedule

## 2016-02-03 ENCOUNTER — Encounter: Payer: Self-pay | Admitting: Internal Medicine

## 2016-02-03 ENCOUNTER — Ambulatory Visit (INDEPENDENT_AMBULATORY_CARE_PROVIDER_SITE_OTHER): Payer: Medicare Other | Admitting: Internal Medicine

## 2016-02-03 VITALS — BP 136/94 | HR 95 | Temp 98.3°F | Resp 16 | Wt 144.0 lb

## 2016-02-03 DIAGNOSIS — I519 Heart disease, unspecified: Secondary | ICD-10-CM | POA: Diagnosis not present

## 2016-02-03 DIAGNOSIS — I10 Essential (primary) hypertension: Secondary | ICD-10-CM | POA: Diagnosis not present

## 2016-02-03 DIAGNOSIS — I5189 Other ill-defined heart diseases: Secondary | ICD-10-CM | POA: Insufficient documentation

## 2016-02-03 NOTE — Patient Instructions (Addendum)
Discontinue the amlodipine because of the swelling. If the swelling does not improve call me.   Monitor your BP at home.  If elevated increase Benicar to twice daily.

## 2016-02-03 NOTE — Progress Notes (Signed)
Pre visit review using our clinic review tool, if applicable. No additional management support is needed unless otherwise documented below in the visit note. 

## 2016-02-03 NOTE — Progress Notes (Signed)
Subjective:    Patient ID: [name], female    DOB: Mar 25, 1945, 70 y.o.   MRN: [id number]  HPI She is here for follow up.   She is here to follow up on her recent echocardiogram.  She wanted to discuss the results in person.  She was initially advised the stress test was normal, but showed a stiff heart.  After looking this up on the internet she thought she was dying.  Her stress test showed no evidence of ischemia.   Swelling in right leg/ankle:  Her swelling started after she was here last and we increased the amlodipine to twice daily.  The swelling has gotten a little worse.  She is taking her BP medications as prescribed.  She monitors her BP at home.  She is tolerating the zetia every other day.    Medications and allergies reviewed with patient and updated if appropriate.  Patient Active Problem List   Diagnosis Date Noted  . LVH (left ventricular hypertrophy) 01/22/2016  . BPPV (benign paroxysmal positional vertigo), bilateral 12/24/2015  . Fatigue 12/24/2015  . Nonspecific abnormal electrocardiogram (ECG) (EKG) 12/24/2015  . Bilateral carotid artery disease, Mild 05/31/2015  . Thyroid nodule 05/31/2015  . Psoriasis   . Scoliosis   . Diabetes type 2, controlled ([place]) 06/03/2008  . Dyslipidemia 06/03/2008  . CARPAL TUNNEL SYNDROME 06/03/2008  . Essential hypertension 06/03/2008  . ALLERGIC RHINITIS 06/03/2008  . Asthma 06/03/2008  . GERD 06/03/2008  . Osteopenia 06/03/2008  . COLONIC POLYPS, HX OF 06/03/2008    Current Outpatient Prescriptions on File Prior to Visit  Medication Sig Dispense Refill  . amLODipine (NORVASC) 2.5 MG tablet Take 1 tablet (2.5 mg total) by mouth 2 (two) times daily. 180 tablet 1  . aspirin EC 81 MG tablet Take 81 mg by mouth daily.    . Azelaic Acid (FINACEA) 15 % cream Apply topically daily. After skin is thoroughly washed and patted dry, gently but thoroughly massage a thin film of azelaic acid cream into the affected area  once daily, in the morning and evening.    . clobetasol (TEMOVATE) 0.05 % external solution Apply 1 application topically at bedtime.     . diazepam (VALIUM) 2 MG tablet Take 1 tablet (2 mg total) by mouth every 6 (six) hours as needed for anxiety. 45 tablet 0  . doxylamine, Sleep, (UNISOM) 25 MG tablet Take 15 mg by mouth at bedtime as needed for sleep.     Scarlette Shorts SURECLICK 50 MG/ML injection Inject into the skin once a week. Once a week sometimes on sundays or mondays. Patient unsure of dose    . ezetimibe (ZETIA) 10 MG tablet Take 20 mg by mouth every other day.    Marland Kitchen glucose blood (ONE TOUCH ULTRA TEST) test strip Use test strip to check blood sugar at least 3 times 300 each 1  . halobetasol (ULTRAVATE) 0.05 % cream Apply 1 application topically daily.     Marland Kitchen ibuprofen (ADVIL,MOTRIN) 200 MG tablet Take 200 mg by mouth every 6 (six) hours as needed for mild pain.    . Lancets (ONETOUCH ULTRASOFT) lancets Use lancet to test blood sugar at least twice a day or as directed by your physician. 200 each 11  . loratadine (CLARITIN REDITABS) 10 MG dissolvable tablet Take 10 mg by mouth every other day as needed for allergies.    . Multiple Vitamin (MULTIVITAMIN) tablet Take 1 tablet by mouth daily.      Marland Kitchen  olmesartan (BENICAR) 20 MG tablet Take 1 tablet (20 mg total) by mouth daily. 30 tablet 11  . omeprazole (PRILOSEC) 20 MG capsule Take 20 mg by mouth daily. OTC    . oxymetazoline (AFRIN 12 HOUR) 0.05 % nasal spray Place 1 spray into the nose daily.       No current facility-administered medications on file prior to visit.     Past Medical History:  Diagnosis Date  . ALLERGIC RHINITIS   . ANEMIA-NOS   . ASTHMA   . Carpal tunnel syndrome   . COLONIC POLYPS, HX OF   . DIABETES MELLITUS, TYPE II    diet controlled  . GERD   . HYPERLIPIDEMIA   . HYPERTENSION   . OSTEOPENIA   . Psoriasis    severe, began soriatane 01/2012  . Rectal fissure   . Scoliosis     Past Surgical History:    Procedure Laterality Date  . benign rectal growth  2004   removed by Dr. Zella Richer  . BREAST SURGERY  1988   biopsy  . CESAREAN SECTION      Social History   Social History  . Marital status: Single    Spouse name: N/A  . Number of children: N/A  . Years of education: N/A   Social History Main Topics  . Smoking status: Former Smoker    Quit date: 02/14/1974  . Smokeless tobacco: Not on file  . Alcohol use No  . Drug use: No  . Sexual activity: Not on file   Other Topics Concern  . Not on file   Social History Narrative  . No narrative on file    Family History  Problem Relation Age of Onset  . Lung cancer Father   . Arthritis Other     Parents  . Asthma Other     parent, other relative  . Breast cancer Other     other relative  . Hypertension Other     parent, other relative  . Heart disease Other     parent, other relative    Review of Systems  Respiratory: Negative for cough, shortness of breath and wheezing.   Cardiovascular: Positive for leg swelling. Negative for chest pain and palpitations.  Neurological: Negative for light-headedness and headaches.       Objective:   Vitals:   02/03/16 1432  BP: (!) 136/94  Pulse: 95  Resp: 16  Temp: 98.3 F (36.8 C)   Filed Weights   02/03/16 1432  Weight: 144 lb (65.3 kg)   Body mass index is 27.21 kg/m.   Physical Exam        Assessment & Plan:   20 minutes were spent face-to-face with the patient, over 50% of which was spent counseling regarding the results of her echocardiogram and her blood pressure    See Problem List for Assessment and Plan of chronic medical problems.

## 2016-02-03 NOTE — Assessment & Plan Note (Signed)
Reviewed Echo with her - grade 1 diastolic dysfunction Will keep BP well controlled Stressed regular exercise

## 2016-02-03 NOTE — Assessment & Plan Note (Signed)
Edema with amlodipine 2.5 mg BID Stop amlodipine Continue Benicar 20 mg daily - if BP elevated increase Benicar to 20 mg BID Call if BP not controlled Follow up in March

## 2016-04-27 ENCOUNTER — Other Ambulatory Visit: Payer: Self-pay | Admitting: Internal Medicine

## 2016-04-27 ENCOUNTER — Encounter: Payer: Self-pay | Admitting: Internal Medicine

## 2016-04-27 ENCOUNTER — Ambulatory Visit (INDEPENDENT_AMBULATORY_CARE_PROVIDER_SITE_OTHER): Payer: Medicare Other | Admitting: Internal Medicine

## 2016-04-27 ENCOUNTER — Other Ambulatory Visit (INDEPENDENT_AMBULATORY_CARE_PROVIDER_SITE_OTHER): Payer: Medicare Other

## 2016-04-27 VITALS — BP 172/112 | HR 89 | Temp 98.3°F | Resp 16 | Wt 143.0 lb

## 2016-04-27 DIAGNOSIS — E1165 Type 2 diabetes mellitus with hyperglycemia: Secondary | ICD-10-CM

## 2016-04-27 DIAGNOSIS — I10 Essential (primary) hypertension: Secondary | ICD-10-CM

## 2016-04-27 DIAGNOSIS — R5383 Other fatigue: Secondary | ICD-10-CM | POA: Diagnosis not present

## 2016-04-27 DIAGNOSIS — K219 Gastro-esophageal reflux disease without esophagitis: Secondary | ICD-10-CM | POA: Diagnosis not present

## 2016-04-27 DIAGNOSIS — E785 Hyperlipidemia, unspecified: Secondary | ICD-10-CM

## 2016-04-27 DIAGNOSIS — Z0000 Encounter for general adult medical examination without abnormal findings: Secondary | ICD-10-CM

## 2016-04-27 LAB — LIPID PANEL
Cholesterol: 254 mg/dL — ABNORMAL HIGH (ref 0–200)
HDL: 52.7 mg/dL (ref >39.00)
LDL Cholesterol: 172 mg/dL — ABNORMAL HIGH (ref 0–99)
NonHDL: 201.51
Total CHOL/HDL Ratio: 5
Triglycerides: 147 mg/dL (ref 0.0–149.0)
VLDL: 29.4 mg/dL (ref 0.0–40.0)

## 2016-04-27 LAB — COMPREHENSIVE METABOLIC PANEL
ALT: 38 U/L — ABNORMAL HIGH (ref 0–35)
AST: 21 U/L (ref 0–37)
Albumin: 4.3 g/dL (ref 3.5–5.2)
Alkaline Phosphatase: 70 U/L (ref 39–117)
BUN: 16 mg/dL (ref 6–23)
CO2: 27 mEq/L (ref 19–32)
Calcium: 10.1 mg/dL (ref 8.4–10.5)
Chloride: 103 mEq/L (ref 96–112)
Creatinine, Ser: 0.92 mg/dL (ref 0.40–1.20)
GFR: 63.69 mL/min (ref >60.00)
Glucose, Bld: 120 mg/dL — ABNORMAL HIGH (ref 70–99)
Potassium: 4.5 mEq/L (ref 3.5–5.1)
Sodium: 141 mEq/L (ref 135–145)
Total Bilirubin: 0.9 mg/dL (ref 0.2–1.2)
Total Protein: 7.7 g/dL (ref 6.0–8.3)

## 2016-04-27 LAB — HEMOGLOBIN A1C: Hgb A1c MFr Bld: 6.6 % — ABNORMAL HIGH (ref 4.6–6.5)

## 2016-04-27 MED ORDER — GLUCOSE BLOOD VI STRP: ORAL_STRIP | 1 refills | Status: DC

## 2016-04-27 MED ORDER — OLMESARTAN MEDOXOMIL 20 MG PO TABS
20.0000 mg | ORAL_TABLET | Freq: Two times a day (BID) | ORAL | 1 refills | Status: DC
Start: 2016-04-27 — End: 2016-05-05

## 2016-04-27 MED ORDER — ONETOUCH ULTRASOFT LANCETS MISC: 11 refills | Status: DC

## 2016-04-27 NOTE — Patient Instructions (Addendum)
  Test(s) ordered today. Your results will be released to [place] (or called to you) after review, usually within 72hours after test completion. If any changes need to be made, you will be notified at that same time.   No immunizations administered today.   Medications reviewed and updated.   No changes recommended at this time.  Your prescription(s) have been submitted to your pharmacy. Please take as directed and contact our office if you believe you are having problem(s) with the medication(s).   Please followup in 6 months for a physical exam

## 2016-04-27 NOTE — Assessment & Plan Note (Addendum)
BP well controlled at home - has white coat htn Current regimen effective and well tolerated Continue current medications at current doses cmp today

## 2016-04-27 NOTE — Assessment & Plan Note (Signed)
GERD controlled Continue daily medication  

## 2016-04-27 NOTE — Progress Notes (Signed)
Subjective:    Patient ID: [name], female    DOB: 1945-11-19, 71 y.o.   MRN: [id number]  HPI The patient is here for follow up.  Hypertension: She is taking her medication daily - she did increase the benicar to 20 mg twice daily. She is compliant with a low sodium diet.  She denies chest pain, palpitations, edema, shortness of breath and regular headaches. She is exercising.  She does monitor her blood pressure at home - well controlled -- 99/68 -138/79.    GERD:  She is taking her medication daily as prescribed.  She denies any GERD symptoms and feels her GERD is well controlled.   Diabetes: She is taking her medication daily as prescribed. She has not been compliant with a diabetic diet recently. She is exercising regularly. She checks her feet daily and denies foot lesions. She is up-to-date with an ophthalmology examination.   Hyperlipidemia: She is taking her zetia every other day - she does not tolerate more. She is compliant with a low fat/cholesterol diet. She is exercising regularly. She denies myalgias.   Fatigue, ? Depression:  She has felt a little tired - it started after increasing the benicar.  She is having a hard time motivating herself to walk - she is not sure if she is just tired or depressed.  If she goes out and walks she feels well.    Cyst on top of right foot:  It has been there one month.  It is not painful.  It was larger and is a little smaller.   Medications and allergies reviewed with patient and updated if appropriate.  Patient Active Problem List   Diagnosis Date Noted  . Diastolic dysfunction 95/63/8756  . BPPV (benign paroxysmal positional vertigo), bilateral 12/24/2015  . Fatigue 12/24/2015  . Nonspecific abnormal electrocardiogram (ECG) (EKG) 12/24/2015  . Bilateral carotid artery disease, Mild 05/31/2015  . Thyroid nodule 05/31/2015  . Psoriasis   . Scoliosis   . Diabetes type 2, controlled ([place]) 06/03/2008  . Dyslipidemia  06/03/2008  . CARPAL TUNNEL SYNDROME 06/03/2008  . Essential hypertension 06/03/2008  . ALLERGIC RHINITIS 06/03/2008  . Asthma 06/03/2008  . GERD 06/03/2008  . Osteopenia 06/03/2008  . COLONIC POLYPS, HX OF 06/03/2008    Current Outpatient Prescriptions on File Prior to Visit  Medication Sig Dispense Refill  . Azelaic Acid (FINACEA) 15 % cream Apply topically daily. After skin is thoroughly washed and patted dry, gently but thoroughly massage a thin film of azelaic acid cream into the affected area once daily, in the morning and evening.    . clobetasol (TEMOVATE) 0.05 % external solution Apply 1 application topically at bedtime.     Marland Kitchen doxylamine, Sleep, (UNISOM) 25 MG tablet Take 15 mg by mouth at bedtime as needed for sleep.     Scarlette Shorts SURECLICK 50 MG/ML injection Inject into the skin once a week. Once a week sometimes on sundays or mondays. Patient unsure of dose    . ezetimibe (ZETIA) 10 MG tablet Take 20 mg by mouth every other day.    Marland Kitchen glucose blood (ONE TOUCH ULTRA TEST) test strip Use test strip to check blood sugar at least 3 times 300 each 1  . ibuprofen (ADVIL,MOTRIN) 200 MG tablet Take 200 mg by mouth every 6 (six) hours as needed for mild pain.    . Lancets (ONETOUCH ULTRASOFT) lancets Use lancet to test blood sugar at least twice a day or as directed by your  physician. 200 each 11  . loratadine (CLARITIN REDITABS) 10 MG dissolvable tablet Take 10 mg by mouth every other day as needed for allergies.    . Multiple Vitamin (MULTIVITAMIN) tablet Take 1 tablet by mouth daily.      Marland Kitchen olmesartan (BENICAR) 20 MG tablet Take 1 tablet (20 mg total) by mouth daily. (Patient taking differently: Take 20 mg by mouth 2 (two) times daily. ) 30 tablet 11  . omeprazole (PRILOSEC) 20 MG capsule Take 20 mg by mouth daily. OTC    . oxymetazoline (AFRIN 12 HOUR) 0.05 % nasal spray Place 1 spray into the nose daily.       No current facility-administered medications on file prior to visit.      Past Medical History:  Diagnosis Date  . ALLERGIC RHINITIS   . ANEMIA-NOS   . ASTHMA   . Carpal tunnel syndrome   . COLONIC POLYPS, HX OF   . DIABETES MELLITUS, TYPE II    diet controlled  . GERD   . HYPERLIPIDEMIA   . HYPERTENSION   . OSTEOPENIA   . Psoriasis    severe, began soriatane 01/2012  . Rectal fissure   . Scoliosis     Past Surgical History:  Procedure Laterality Date  . benign rectal growth  2004   removed by Dr. Zella Richer  . BREAST SURGERY  1988   biopsy  . CESAREAN SECTION      Social History   Social History  . Marital status: Single    Spouse name: N/A  . Number of children: N/A  . Years of education: N/A   Social History Main Topics  . Smoking status: Former Smoker    Quit date: 02/14/1974  . Smokeless tobacco: Not on file  . Alcohol use No  . Drug use: No  . Sexual activity: Not on file   Other Topics Concern  . Not on file   Social History Narrative  . No narrative on file    Family History  Problem Relation Age of Onset  . Lung cancer Father   . Arthritis Other     Parents  . Asthma Other     parent, other relative  . Breast cancer Other     other relative  . Hypertension Other     parent, other relative  . Heart disease Other     parent, other relative    Review of Systems  Constitutional: Negative for fever.       Red flushed face intermittently, body temp varies  Respiratory: Negative for cough, shortness of breath and wheezing.   Cardiovascular: Positive for palpitations (one episode) and leg swelling (mild foot swelling). Negative for chest pain.  Gastrointestinal:       Jerrye Bushy controlled  Neurological: Negative for light-headedness and headaches.       Objective:   Vitals:   04/27/16 0823  BP: (!) 172/112  Pulse: 89  Resp: 16  Temp: 98.3 F (36.8 C)   Wt Readings from Last 3 Encounters:  04/27/16 143 lb (64.9 kg)  02/03/16 144 lb (65.3 kg)  01/22/16 143 lb (64.9 kg)   Body mass index is 27.02  kg/m.   Physical Exam    Constitutional: Appears well-developed and well-nourished. No distress.  HENT:  Head: Normocephalic and atraumatic.  Neck: Neck supple. No tracheal deviation present. No thyromegaly present.  No cervical lymphadenopathy Cardiovascular: Normal rate, regular rhythm and normal heart sounds.   No murmur heard. No carotid bruit .  No  edema RLE, mild edema left foot Pulmonary/Chest: Effort normal and breath sounds normal. No respiratory distress. No has no wheezes. No rales.  Skin: Skin is warm and dry. Not diaphoretic. cyst on first MTP tendon - non tender Psychiatric: Normal mood and affect. Behavior is normal.      Assessment & Plan:    See Problem List for Assessment and Plan of chronic medical problems.   FU 6 months

## 2016-04-27 NOTE — Assessment & Plan Note (Signed)
Check a1c Low sugar / carb diet Stressed regular exercise, weight loss  

## 2016-04-27 NOTE — Assessment & Plan Note (Signed)
Mild  Unlikely depression ? Related to increasing benicar Continue regular exercise Check labs

## 2016-04-27 NOTE — Progress Notes (Signed)
Pre visit review using our clinic review tool, if applicable. No additional management support is needed unless otherwise documented below in the visit note. 

## 2016-04-28 ENCOUNTER — Encounter: Payer: Self-pay | Admitting: Emergency Medicine

## 2016-05-05 ENCOUNTER — Telehealth: Payer: Self-pay | Admitting: Emergency Medicine

## 2016-05-05 MED ORDER — OLMESARTAN MEDOXOMIL 20 MG PO TABS: ORAL_TABLET | ORAL | 1 refills | Status: DC

## 2016-05-05 NOTE — Telephone Encounter (Signed)
New RX sent to pharmacy, insurance would not cover Benicar 2 times daily.

## 2016-05-09 ENCOUNTER — Telehealth: Payer: Self-pay | Admitting: Emergency Medicine

## 2016-05-09 MED ORDER — OLMESARTAN MEDOXOMIL 40 MG PO TABS: 20.0000 mg | ORAL_TABLET | Freq: Every day | ORAL | 5 refills | Status: DC

## 2016-05-09 NOTE — Telephone Encounter (Signed)
Sent to [facility] 

## 2016-05-09 NOTE — Telephone Encounter (Signed)
Pt came into office after calling and states that the pharmacy told her that she could try having 40 mg of Benicar and taking 1/2 a tablet a day. Insurance will not cover 2 tablets per day. Please advise and send to [facility] pharmacy. Pt states she has to take the 20 mg bc that's the only thing that will regulate her BP.

## 2016-05-10 ENCOUNTER — Other Ambulatory Visit: Payer: Self-pay | Admitting: Internal Medicine

## 2016-05-23 ENCOUNTER — Encounter: Payer: Self-pay | Admitting: Nurse Practitioner

## 2016-05-23 ENCOUNTER — Ambulatory Visit (INDEPENDENT_AMBULATORY_CARE_PROVIDER_SITE_OTHER): Payer: Medicare Other | Admitting: Nurse Practitioner

## 2016-05-23 VITALS — BP 162/106 | HR 81 | Temp 98.5°F | Ht 61.2 in | Wt 144.0 lb

## 2016-05-23 DIAGNOSIS — S30861A Insect bite (nonvenomous) of abdominal wall, initial encounter: Secondary | ICD-10-CM

## 2016-05-23 DIAGNOSIS — W57XXXA Bitten or stung by nonvenomous insect and other nonvenomous arthropods, initial encounter: Secondary | ICD-10-CM | POA: Diagnosis not present

## 2016-05-23 MED ORDER — DOXYCYCLINE HYCLATE 100 MG PO TABS: 200.0000 mg | ORAL_TABLET | Freq: Once | ORAL | 0 refills | Status: AC

## 2016-05-23 NOTE — Patient Instructions (Signed)
Tick bite  Tick Bite Information Introduction Ticks are insects that attach themselves to the skin. There are many types of ticks. Common types include wood ticks and deer ticks. Sometimes, ticks carry diseases that can make a person very ill. The most common places for ticks to attach themselves are the scalp, neck, armpits, waist, and groin. HOW CAN YOU PREVENT TICK BITES? Take these steps to help prevent tick bites when you are outdoors:  Wear long sleeves and long pants.  Wear white clothes so you can see ticks more easily.  Tuck your pant legs into your socks.  If walking on a trail, stay in the middle of the trail to avoid brushing against bushes.  Avoid walking through areas with long grass.  Put bug spray on all skin that is showing and along boot tops, pant legs, and sleeve cuffs.  Check clothes, hair, and skin often and before going inside.  Brush off any ticks that are not attached.  Take a shower or bath as soon as possible after being outdoors. HOW SHOULD YOU REMOVE A TICK? Ticks should be removed as soon as possible to help prevent diseases. 1. If latex gloves are available, put them on before trying to remove a tick. 2. Use tweezers to grasp the tick as close to the skin as possible. You may also use curved forceps or a tick removal tool. Grasp the tick as close to its head as possible. Avoid grasping the tick on its body. 3. Pull gently upward until the tick lets go. Do not twist the tick or jerk it suddenly. This may break off the tick's head or mouth parts. 4. Do not squeeze or crush the tick's body. This could force disease-carrying fluids from the tick into your body. 5. After the tick is removed, wash the bite area and your hands with soap and water or alcohol. 6. Apply a small amount of antiseptic cream or ointment to the bite site. 7. Wash any tools that were used. Do not try to remove a tick by applying a hot match, petroleum jelly, or fingernail polish to  the tick. These methods do not work. They may also increase the chances of disease being spread from the tick bite. WHEN SHOULD YOU SEEK HELP? Contact your health care provider if you are unable to remove a tick or if a part of the tick breaks off in the skin. After a tick bite, you need to watch for signs and symptoms of diseases that can be spread by ticks. Contact your health care provider if you develop any of the following:  Fever.  Rash.  Redness and puffiness (swelling) in the area of the tick bite.  Tender, puffy lymph glands.  Watery poop (diarrhea).  Weight loss.  Cough.  Feeling more tired than normal (fatigue).  Muscle, joint, or bone pain.  Belly (abdominal) pain.  Headache.  Change in your level of consciousness.  Trouble walking or moving your legs.  Loss of feeling (numbness) in the legs.  Loss of movement (paralysis).  Shortness of breath.  Confusion.  Throwing up (vomiting) many times. This information is not intended to replace advice given to you by your health care provider. Make sure you discuss any questions you have with your health care provider. Document Released: 04/27/2009 Document Revised: 07/09/2015 Document Reviewed: 07/11/2012 Elsevier Interactive Patient Education  2017 [employer].

## 2016-05-23 NOTE — Progress Notes (Signed)
Pre visit review using our clinic review tool, if applicable. No additional management support is needed unless otherwise documented below in the visit note. 

## 2016-05-23 NOTE — Progress Notes (Signed)
Subjective:  Patient ID: [name], female    DOB: [birth year]  Age: 71 y.o. MRN: [id number]  CC: Tick Removal (found tick on body this morning. pt is concern due to low immune system. )   HPI [name] presents with remnant of tick on right side of ABD. She believes tick has been attached <48hrs. No fever, no joint pain, no nausea or fatigue.  She reports that she is able to take doxycycline but not tetracycline. Take doxycycline in past with no adverse effects.  Outpatient Medications Prior to Visit  Medication Sig Dispense Refill  . Azelaic Acid (FINACEA) 15 % cream Apply topically daily. After skin is thoroughly washed and patted dry, gently but thoroughly massage a thin film of azelaic acid cream into the affected area once daily, in the morning and evening.    . clobetasol (TEMOVATE) 0.05 % external solution Apply 1 application topically at bedtime.     Marland Kitchen doxylamine, Sleep, (UNISOM) 25 MG tablet Take 15 mg by mouth at bedtime as needed for sleep.     Scarlette Shorts SURECLICK 50 MG/ML injection Inject into the skin once a week. Once a week sometimes on sundays or mondays. Patient unsure of dose    . ezetimibe (ZETIA) 10 MG tablet Take 20 mg by mouth every other day.    Marland Kitchen glucose blood (ONE TOUCH ULTRA TEST) test strip Use test strip to check blood sugar at least 3 times 300 each 1  . ibuprofen (ADVIL,MOTRIN) 200 MG tablet Take 200 mg by mouth every 6 (six) hours as needed for mild pain.    . Lancets (ONETOUCH ULTRASOFT) lancets Use lancet to test blood sugar at least twice a day or as directed by your physician. 200 each 11  . loratadine (CLARITIN REDITABS) 10 MG dissolvable tablet Take 10 mg by mouth every other day as needed for allergies.    . Multiple Vitamin (MULTIVITAMIN) tablet Take 1 tablet by mouth daily.      Marland Kitchen olmesartan (BENICAR) 40 MG tablet Take 0.5 tablets (20 mg total) by mouth daily. Take 0.5 tablet if BP > 140/90 prn 30 tablet 5  . omeprazole (PRILOSEC) 20 MG  capsule Take 20 mg by mouth daily. OTC    . oxymetazoline (AFRIN 12 HOUR) 0.05 % nasal spray Place 1 spray into the nose daily.      Marland Kitchen ZETIA 10 MG tablet TAKE ONE (1) TABLET BY MOUTH EVERY DAY 90 tablet 1   No facility-administered medications prior to visit.     ROS See HPI  Objective:  BP (!) 162/106   Pulse 81   Temp 98.5 F (36.9 C)   Ht 5' 1.2" (1.554 m)   Wt 144 lb (65.3 kg)   SpO2 99%   BMI 27.03 kg/m   BP Readings from Last 3 Encounters:  05/23/16 (!) 162/106  04/27/16 (!) 172/112  02/03/16 (!) 136/94    Wt Readings from Last 3 Encounters:  05/23/16 144 lb (65.3 kg)  04/27/16 143 lb (64.9 kg)  02/03/16 144 lb (65.3 kg)    Physical Exam  Constitutional: She is oriented to person, place, and time. No distress.  Cardiovascular: Normal rate.   Pulmonary/Chest: Effort normal.  Abdominal: Soft. She exhibits no distension.    Procedure note:  Removal of tick Remnant:  The patient was placed in a decubitus position. The area was cleaned with betadine. Used #11 scapel to removed tick remnant.  The wound was dressed with antibiotic ointment and a  bandaid.  Tolerated well.  Complications: None.   Wound instructions provided.  Neurological: She is alert and oriented to person, place, and time.  Skin: Skin is warm and dry. There is erythema.  Vitals reviewed.   Lab Results  Component Value Date   WBC 9.6 12/18/2015   HGB 13.9 12/18/2015   HCT 41.0 12/18/2015   PLT 294 12/18/2015   GLUCOSE 120 (H) 04/27/2016   CHOL 254 (H) 04/27/2016   TRIG 147.0 04/27/2016   HDL 52.70 04/27/2016   LDLDIRECT 166.0 06/22/2015   LDLCALC 172 (H) 04/27/2016   ALT 38 (H) 04/27/2016   AST 21 04/27/2016   NA 141 04/27/2016   K 4.5 04/27/2016   CL 103 04/27/2016   CREATININE 0.92 04/27/2016   BUN 16 04/27/2016   CO2 27 04/27/2016   TSH 2.54 12/24/2015   INR 0.92 12/18/2015   HGBA1C 6.6 (H) 04/27/2016   MICROALBUR <0.7 10/28/2015    No results found.  Assessment &  Plan:   [name] was seen today for tick removal.  Diagnoses and all orders for this visit:  Tick bite of abdomen, initial encounter -     doxycycline (VIBRA-TABS) 100 MG tablet; Take 2 tablets (200 mg total) by mouth once.   I am having [name] start on doxycycline. I am also having her maintain her oxymetazoline, clobetasol, Azelaic Acid, multivitamin, omeprazole, doxylamine (Sleep), ENBREL SURECLICK, loratadine, ibuprofen, ezetimibe, glucose blood, onetouch ultrasoft, olmesartan, and ZETIA.  Meds ordered this encounter  Medications  . doxycycline (VIBRA-TABS) 100 MG tablet    Sig: Take 2 tablets (200 mg total) by mouth once.    Dispense:  2 tablet    Refill:  0    Order Specific Question:   Supervising Provider    Answer:   Cassandria Anger [[id number]]    Follow-up: No Follow-up on file.  Wilfred Lacy, NP

## 2016-06-06 ENCOUNTER — Other Ambulatory Visit: Payer: Self-pay | Admitting: Internal Medicine

## 2016-06-06 ENCOUNTER — Other Ambulatory Visit (INDEPENDENT_AMBULATORY_CARE_PROVIDER_SITE_OTHER): Payer: Medicare Other

## 2016-06-06 ENCOUNTER — Ambulatory Visit (INDEPENDENT_AMBULATORY_CARE_PROVIDER_SITE_OTHER): Payer: Medicare Other | Admitting: Internal Medicine

## 2016-06-06 ENCOUNTER — Encounter: Payer: Self-pay | Admitting: Internal Medicine

## 2016-06-06 VITALS — BP 156/102 | HR 87 | Temp 98.1°F | Resp 16 | Wt 142.0 lb

## 2016-06-06 DIAGNOSIS — W57XXXD Bitten or stung by nonvenomous insect and other nonvenomous arthropods, subsequent encounter: Secondary | ICD-10-CM

## 2016-06-06 DIAGNOSIS — I10 Essential (primary) hypertension: Secondary | ICD-10-CM | POA: Diagnosis not present

## 2016-06-06 DIAGNOSIS — R197 Diarrhea, unspecified: Secondary | ICD-10-CM | POA: Diagnosis not present

## 2016-06-06 DIAGNOSIS — R5383 Other fatigue: Secondary | ICD-10-CM

## 2016-06-06 DIAGNOSIS — S30861D Insect bite (nonvenomous) of abdominal wall, subsequent encounter: Secondary | ICD-10-CM

## 2016-06-06 DIAGNOSIS — W57XXXA Bitten or stung by nonvenomous insect and other nonvenomous arthropods, initial encounter: Secondary | ICD-10-CM | POA: Insufficient documentation

## 2016-06-06 LAB — CBC WITH DIFFERENTIAL/PLATELET
Basophils Absolute: 0.1 10*3/uL (ref 0.0–0.1)
Basophils Relative: 1.5 % (ref 0.0–3.0)
Eosinophils Absolute: 0.3 10*3/uL (ref 0.0–0.7)
Eosinophils Relative: 3.9 % (ref 0.0–5.0)
HCT: 39.8 % (ref 36.0–46.0)
Hemoglobin: 13.4 g/dL (ref 12.0–15.0)
Lymphocytes Relative: 39.7 % (ref 12.0–46.0)
Lymphs Abs: 3.1 10*3/uL (ref 0.7–4.0)
MCHC: 33.6 g/dL (ref 30.0–36.0)
MCV: 93.8 fl (ref 78.0–100.0)
Monocytes Absolute: 0.7 10*3/uL (ref 0.1–1.0)
Monocytes Relative: 9.1 % (ref 3.0–12.0)
Neutro Abs: 3.5 10*3/uL (ref 1.4–7.7)
Neutrophils Relative %: 45.8 % (ref 43.0–77.0)
Platelets: 302 10*3/uL (ref 150.0–400.0)
RBC: 4.25 Mil/uL (ref 3.87–5.11)
RDW: 14.2 % (ref 11.5–15.5)
WBC: 7.7 10*3/uL (ref 4.0–10.5)

## 2016-06-06 LAB — COMPREHENSIVE METABOLIC PANEL
ALT: 35 U/L (ref 0–35)
AST: 23 U/L (ref 0–37)
Albumin: 4.3 g/dL (ref 3.5–5.2)
Alkaline Phosphatase: 68 U/L (ref 39–117)
BUN: 17 mg/dL (ref 6–23)
CO2: 27 mEq/L (ref 19–32)
Calcium: 9.9 mg/dL (ref 8.4–10.5)
Chloride: 106 mEq/L (ref 96–112)
Creatinine, Ser: 0.94 mg/dL (ref 0.40–1.20)
GFR: 62.11 mL/min (ref >60.00)
Glucose, Bld: 82 mg/dL (ref 70–99)
Potassium: 4.4 mEq/L (ref 3.5–5.1)
Sodium: 141 mEq/L (ref 135–145)
Total Bilirubin: 0.4 mg/dL (ref 0.2–1.2)
Total Protein: 7.3 g/dL (ref 6.0–8.3)

## 2016-06-06 NOTE — Progress Notes (Signed)
Subjective:    Patient ID: [name], female    DOB: March 19, 1945, 71 y.o.   MRN: [id number]  HPI She is here for an acute visit.   She had tick bite two weeks ago.  She was given doxycyline 200 mg x 1.  She did bring the tick in that day, but it was not looked at.  She brought it in again today - it has been in the freezer.  She does not think it is a deer tick.    She has been having several symptoms and is concerned about lyme disease.   Two - three days after she was here she started having low grade fevers, headaches, diarrhea, achy joints - especially in the rib cage, stabbing transient pain near her tick bite, shortness of breath when walking.  She is very tired.    Medications and allergies reviewed with patient and updated if appropriate.  Patient Active Problem List   Diagnosis Date Noted  . Diastolic dysfunction 75/91/6384  . BPPV (benign paroxysmal positional vertigo), bilateral 12/24/2015  . Fatigue 12/24/2015  . Nonspecific abnormal electrocardiogram (ECG) (EKG) 12/24/2015  . Bilateral carotid artery disease, Mild 05/31/2015  . Thyroid nodule 05/31/2015  . Psoriasis   . Scoliosis   . Diabetes type 2, controlled ([place]) 06/03/2008  . Dyslipidemia 06/03/2008  . CARPAL TUNNEL SYNDROME 06/03/2008  . Essential hypertension 06/03/2008  . ALLERGIC RHINITIS 06/03/2008  . Asthma 06/03/2008  . GERD 06/03/2008  . Osteopenia 06/03/2008  . COLONIC POLYPS, HX OF 06/03/2008    Current Outpatient Prescriptions on File Prior to Visit  Medication Sig Dispense Refill  . Azelaic Acid (FINACEA) 15 % cream Apply topically daily. After skin is thoroughly washed and patted dry, gently but thoroughly massage a thin film of azelaic acid cream into the affected area once daily, in the morning and evening.    . clobetasol (TEMOVATE) 0.05 % external solution Apply 1 application topically at bedtime.     Marland Kitchen doxylamine, Sleep, (UNISOM) 25 MG tablet Take 15 mg by mouth at bedtime as  needed for sleep.     Scarlette Shorts SURECLICK 50 MG/ML injection Inject into the skin once a week. Once a week sometimes on sundays or mondays. Patient unsure of dose    . ezetimibe (ZETIA) 10 MG tablet Take 20 mg by mouth every other day.    Marland Kitchen glucose blood (ONE TOUCH ULTRA TEST) test strip Use test strip to check blood sugar at least 3 times 300 each 1  . ibuprofen (ADVIL,MOTRIN) 200 MG tablet Take 200 mg by mouth every 6 (six) hours as needed for mild pain.    . Lancets (ONETOUCH ULTRASOFT) lancets Use lancet to test blood sugar at least twice a day or as directed by your physician. 200 each 11  . loratadine (CLARITIN REDITABS) 10 MG dissolvable tablet Take 10 mg by mouth every other day as needed for allergies.    . Multiple Vitamin (MULTIVITAMIN) tablet Take 1 tablet by mouth daily.      Marland Kitchen olmesartan (BENICAR) 40 MG tablet Take 0.5 tablets (20 mg total) by mouth daily. Take 0.5 tablet if BP > 140/90 prn 30 tablet 5  . omeprazole (PRILOSEC) 20 MG capsule Take 20 mg by mouth daily. OTC    . oxymetazoline (AFRIN 12 HOUR) 0.05 % nasal spray Place 1 spray into the nose daily.      Marland Kitchen ZETIA 10 MG tablet TAKE ONE (1) TABLET BY MOUTH EVERY DAY 90 tablet  1   No current facility-administered medications on file prior to visit.     Past Medical History:  Diagnosis Date  . ALLERGIC RHINITIS   . ANEMIA-NOS   . ASTHMA   . Carpal tunnel syndrome   . COLONIC POLYPS, HX OF   . DIABETES MELLITUS, TYPE II    diet controlled  . GERD   . HYPERLIPIDEMIA   . HYPERTENSION   . OSTEOPENIA   . Psoriasis    severe, began soriatane 01/2012  . Rectal fissure   . Scoliosis     Past Surgical History:  Procedure Laterality Date  . benign rectal growth  2004   removed by Dr. Zella Richer  . BREAST SURGERY  1988   biopsy  . CESAREAN SECTION      Social History   Social History  . Marital status: Single    Spouse name: N/A  . Number of children: N/A  . Years of education: N/A   Social History Main  Topics  . Smoking status: Former Smoker    Quit date: 02/14/1974  . Smokeless tobacco: Never Used  . Alcohol use No  . Drug use: No  . Sexual activity: Not on file   Other Topics Concern  . Not on file   Social History Narrative  . No narrative on file    Family History  Problem Relation Age of Onset  . Lung cancer Father   . Arthritis Other     Parents  . Asthma Other     parent, other relative  . Breast cancer Other     other relative  . Hypertension Other     parent, other relative  . Heart disease Other     parent, other relative    Review of Systems  Constitutional: Positive for fatigue and fever (low grade).  Respiratory: Positive for cough (occ, not new) and shortness of breath (with walking for exercise). Negative for wheezing.   Cardiovascular: Positive for leg swelling (mild, chronic). Negative for chest pain and palpitations.  Gastrointestinal: Positive for abdominal pain (jabbing pain near the bite) and diarrhea (3-4 times a day, every day except yesterday). Negative for blood in stool.  Musculoskeletal: Positive for arthralgias (right hip, soreness right rib cage, left hip (chronic), knee ( chronic)). Negative for joint swelling.  Skin: Positive for rash (around bite x few days then resolved - did have a bandaid on it).  Neurological: Positive for headaches (none today).       Objective:   Vitals:   06/06/16 1542  BP: (!) 156/102  Pulse: 87  Resp: 16  Temp: 98.1 F (36.7 C)   Filed Weights   06/06/16 1542  Weight: 142 lb (64.4 kg)   Body mass index is 26.66 kg/m.  Wt Readings from Last 3 Encounters:  06/06/16 142 lb (64.4 kg)  05/23/16 144 lb (65.3 kg)  04/27/16 143 lb (64.9 kg)     Physical Exam Constitutional: Appears well-developed and well-nourished. No distress.  HENT:  Head: Normocephalic and atraumatic.  Neck: Neck supple. No tracheal deviation present. No thyromegaly present.  No cervical lymphadenopathy Cardiovascular: Normal  rate, regular rhythm and normal heart sounds.   No murmur heard. No carotid bruit .  No edema Pulmonary/Chest: Effort normal and breath sounds normal. No respiratory distress. No has no wheezes. No rales.  Abdomen: soft, NT, ND Skin: Skin is warm and dry. Not diaphoretic. indurated area the size of a dime on her right abdomen that is slightly tender.  No  surrounding rash or bulls eye. No other visible rash Psychiatric: Normal mood and affect. Behavior is normal.         Assessment & Plan:   See Problem List for Assessment and Plan of chronic medical problems.

## 2016-06-06 NOTE — Assessment & Plan Note (Signed)
Given doxy 200 mg x 1 Now with symptoms consistent with possible tick disease - unlikely lyme, but will check lyme Check cbc, cmp If symptoms persist - will refer to ID

## 2016-06-06 NOTE — Assessment & Plan Note (Signed)
In combination of several symptoms Concern for tick born disease Check lyme - the tick does not looks like a deer tick, but reassurance is needed as far as lyme disease goes Check cbc, cmp If symptoms persist will consider ID referral given tick she brought in - ? Lone star tick

## 2016-06-06 NOTE — Assessment & Plan Note (Signed)
She has white coat htn - BP better controlled at home Continue current medication and continue to monitor BP at home

## 2016-06-06 NOTE — Patient Instructions (Signed)
  Test(s) ordered today. Your results will be released to [place] (or called to you) after review, usually within 72hours after test completion. If any changes need to be made, you will be notified at that same time.   Monitor your symptoms

## 2016-06-06 NOTE — Assessment & Plan Note (Signed)
Unlikely related to other symptoms/ tick disease ? Related to zetia - she can only tolerate the medication every other day due to diarrhea Will hold zetia temporarily to see if that improves diarrhea

## 2016-06-06 NOTE — Progress Notes (Signed)
Pre visit review using our clinic review tool, if applicable. No additional management support is needed unless otherwise documented below in the visit note. 

## 2016-06-07 LAB — LYME AB/WESTERN BLOT REFLEX: B burgdorferi Ab IgG+IgM: 1.18 Index — ABNORMAL HIGH (ref <0.90)

## 2016-06-08 LAB — LYME ABY, WSTRN BLT IGG & IGM W/BANDS
B burgdorferi IgG Abs (IB): NEGATIVE
B burgdorferi IgM Abs (IB): NEGATIVE
Lyme Disease 18 kD IgG: NONREACTIVE
Lyme Disease 23 kD IgG: NONREACTIVE
Lyme Disease 23 kD IgM: NONREACTIVE
Lyme Disease 28 kD IgG: NONREACTIVE
Lyme Disease 30 kD IgG: NONREACTIVE
Lyme Disease 39 kD IgG: REACTIVE — AB
Lyme Disease 39 kD IgM: NONREACTIVE
Lyme Disease 41 kD IgG: NONREACTIVE
Lyme Disease 41 kD IgM: NONREACTIVE
Lyme Disease 45 kD IgG: NONREACTIVE
Lyme Disease 58 kD IgG: NONREACTIVE
Lyme Disease 66 kD IgG: NONREACTIVE
Lyme Disease 93 kD IgG: NONREACTIVE

## 2016-06-13 ENCOUNTER — Other Ambulatory Visit: Payer: Self-pay | Admitting: Internal Medicine

## 2016-06-13 DIAGNOSIS — Z1231 Encounter for screening mammogram for malignant neoplasm of breast: Secondary | ICD-10-CM

## 2016-06-30 ENCOUNTER — Ambulatory Visit
Admission: RE | Admit: 2016-06-30 | Discharge: 2016-06-30 | Disposition: A | Discharge status: Home / Self Care | Payer: Medicare Other | Source: Ambulatory Visit | Attending: Internal Medicine | Admitting: Internal Medicine

## 2016-06-30 DIAGNOSIS — Z1231 Encounter for screening mammogram for malignant neoplasm of breast: Secondary | ICD-10-CM

## 2016-07-01 LAB — HM DIABETES EYE EXAM

## 2016-07-12 ENCOUNTER — Encounter: Payer: Self-pay | Admitting: Internal Medicine

## 2016-07-12 NOTE — Progress Notes (Unsigned)
Results entered and sent to scan  

## 2016-10-29 NOTE — Progress Notes (Signed)
Subjective:    Patient ID: [name], female    DOB: [birth year], 71 y.o.   MRN: [id number]  HPI She is here for a physical exam.   After the tick bite she kept getting infections - left tonsil infection, face boil and right tonsil infection.  She started to get pus filled bumps in her hands, then legs, back, arms and scalp.  She saw her dermatologist at [facility], Dr Lyman Speller.  She also had fatigue, joint pain - one toe would become swollen and then a different one, intermittent fever, muscle aches and mental confusion.  He thought it was either related to the tick bite or a yeast infection.  She was put on doxycycline for 30 days and diflucan for 2 weeks.  She was bite by a lone star tick at that time and is concerned about ehrlichiosis.    Hypertension: She is taking her medication daily.  She does monitor her blood pressure at home - 121/73, 139/83, 132/79, 139/85, 128/79, 130/75.     She has not been exercising regularly for 3 weeks and has not been eating as well recently.   Her toes are red and they have been that way for a couple of years.  They do not vary in color - the redness is constant.  Her left foot swells at times.  She has intermittent toe pain.  She denies diffuse pain where the redness is.  She does use steroid cream on her feet a lot for psoriasis.  Her dermatologist did not know what the redness was from.     Medications and allergies reviewed with patient and updated if appropriate.  Patient Active Problem List   Diagnosis Date Noted  . Tick bite 06/06/2016  . Diarrhea 06/06/2016  . Diastolic dysfunction 41/32/4401  . BPPV (benign paroxysmal positional vertigo), bilateral 12/24/2015  . Fatigue 12/24/2015  . Nonspecific abnormal electrocardiogram (ECG) (EKG) 12/24/2015  . Bilateral carotid artery disease, Mild 05/31/2015  . Thyroid nodule 05/31/2015  . Psoriasis   . Scoliosis   . Diabetes type 2, controlled ([place]) 06/03/2008  . Dyslipidemia 06/03/2008    . CARPAL TUNNEL SYNDROME 06/03/2008  . Essential hypertension 06/03/2008  . ALLERGIC RHINITIS 06/03/2008  . Asthma 06/03/2008  . GERD 06/03/2008  . Osteopenia 06/03/2008  . COLONIC POLYPS, HX OF 06/03/2008    Current Outpatient Prescriptions on File Prior to Visit  Medication Sig Dispense Refill  . Azelaic Acid (FINACEA) 15 % cream Apply topically daily. After skin is thoroughly washed and patted dry, gently but thoroughly massage a thin film of azelaic acid cream into the affected area once daily, in the morning and evening.    . clobetasol (TEMOVATE) 0.05 % external solution Apply 1 application topically at bedtime.     Marland Kitchen doxylamine, Sleep, (UNISOM) 25 MG tablet Take 15 mg by mouth at bedtime as needed for sleep.     Scarlette Shorts SURECLICK 50 MG/ML injection Inject into the skin once a week. Once a week sometimes on sundays or mondays. Patient unsure of dose    . ezetimibe (ZETIA) 10 MG tablet Take 20 mg by mouth every other day.    Marland Kitchen glucose blood (ONE TOUCH ULTRA TEST) test strip Use test strip to check blood sugar at least 3 times 300 each 1  . ibuprofen (ADVIL,MOTRIN) 200 MG tablet Take 200 mg by mouth every 6 (six) hours as needed for mild pain.    . Lancets (ONETOUCH ULTRASOFT) lancets Use lancet to test  blood sugar at least twice a day or as directed by your physician. 200 each 11  . loratadine (CLARITIN REDITABS) 10 MG dissolvable tablet Take 10 mg by mouth every other day as needed for allergies.    . Multiple Vitamin (MULTIVITAMIN) tablet Take 1 tablet by mouth daily.      Marland Kitchen olmesartan (BENICAR) 40 MG tablet Take 0.5 tablets (20 mg total) by mouth daily. Take 0.5 tablet if BP > 140/90 prn 30 tablet 5  . omeprazole (PRILOSEC) 20 MG capsule Take 20 mg by mouth daily. OTC    . oxymetazoline (AFRIN 12 HOUR) 0.05 % nasal spray Place 1 spray into the nose daily.       No current facility-administered medications on file prior to visit.     Past Medical History:  Diagnosis Date  .  ALLERGIC RHINITIS   . ANEMIA-NOS   . ASTHMA   . Carpal tunnel syndrome   . COLONIC POLYPS, HX OF   . DIABETES MELLITUS, TYPE II    diet controlled  . GERD   . HYPERLIPIDEMIA   . HYPERTENSION   . OSTEOPENIA   . Psoriasis    severe, began soriatane 01/2012  . Rectal fissure   . Scoliosis     Past Surgical History:  Procedure Laterality Date  . benign rectal growth  2004   removed by Dr. Zella Richer  . BREAST SURGERY  1988   biopsy  . CESAREAN SECTION      Social History   Social History  . Marital status: Single    Spouse name: N/A  . Number of children: N/A  . Years of education: N/A   Social History Main Topics  . Smoking status: Former Smoker    Quit date: 02/14/1974  . Smokeless tobacco: Never Used  . Alcohol use No  . Drug use: No  . Sexual activity: Not Asked   Other Topics Concern  . None   Social History Narrative  . None    Family History  Problem Relation Age of Onset  . Lung cancer Father   . Arthritis Other        Parents  . Asthma Other        parent, other relative  . Breast cancer Other        other relative  . Hypertension Other        parent, other relative  . Heart disease Other        parent, other relative    Review of Systems  Constitutional: Positive for fatigue. Negative for chills and fever.  Eyes: Negative for visual disturbance.  Respiratory: Positive for cough (dry cough) and shortness of breath (recently - with exertion - with incline). Negative for wheezing.   Cardiovascular: Positive for leg swelling (left foot intermittent). Negative for chest pain and palpitations.  Gastrointestinal: Positive for anal bleeding (fissure bleeding intermittently). Negative for abdominal pain, blood in stool, constipation, diarrhea and nausea.       Gerd  Genitourinary: Negative for dysuria and hematuria.  Musculoskeletal: Positive for arthralgias.  Skin: Positive for rash (being treated by derm).  Neurological: Negative for dizziness,  light-headedness and headaches.  Psychiatric/Behavioral: Negative for dysphoric mood. The patient is not nervous/anxious.        Objective:   Vitals:   10/31/16 0838  BP: (!) 172/104  Pulse: 77  Resp: 16  Temp: 98 F (36.7 C)  SpO2: 97%   Filed Weights   10/31/16 0838  Weight: 139 lb (63 kg)  Body mass index is 26.09 kg/m.  Wt Readings from Last 3 Encounters:  10/31/16 139 lb (63 kg)  06/06/16 142 lb (64.4 kg)  05/23/16 144 lb (65.3 kg)     Physical Exam Constitutional: She appears well-developed and well-nourished. No distress.  HENT:  Head: Normocephalic and atraumatic.  Right Ear: External ear normal. Normal ear canal and TM Left Ear: External ear normal.  Normal ear canal and TM Mouth/Throat: Oropharynx is clear and moist.  Eyes: Conjunctivae and EOM are normal.  Neck: Neck supple. No tracheal deviation present. No thyromegaly present.  No carotid bruit  Cardiovascular: Normal rate, regular rhythm and normal heart sounds.   No murmur heard.  No edema. Pulmonary/Chest: Effort normal and breath sounds normal. No respiratory distress. She has no wheezes. She has no rales.  Breast: deferred Abdominal: Soft. She exhibits no distension. There is no tenderness.  Lymphadenopathy: She has no cervical adenopathy.  Skin: Skin is warm and dry. She is not diaphoretic.  redness in distal feet b/l - non tender, capillaries present and redness blanches with pressure Psychiatric: She has a normal mood and affect. Her behavior is normal.         Assessment & Plan:   Physical exam: Screening blood work   ordered Immunizations   Flu today, shingrix discussed Colonoscopy    Last done 2008,  Due this year Mammogram   Up to date  Gyn   No longer sees one Dexa    Last done 2014 - due - she declined Eye exams   Up to date  EKG   Done 12/2015 Exercise  Not currently - will try to get back into it Weight  - advised weight loss Skin   Derm annually Substance abuse   none  See Problem List for Assessment and Plan of chronic medical problems.   FU in 6 months

## 2016-10-29 NOTE — Patient Instructions (Addendum)
Test(s) ordered today. Your results will be released to MyChart (or called to you) after review, usually within 72hours after test completion. If any changes need to be made, you will be notified at that same time.  All other Health Maintenance issues reviewed.   All recommended immunizations and age-appropriate screenings are up-to-date or discussed.  Flu immunization administered today.    Medications reviewed and updated.   No changes recommended at this time.   Please followup in 6 months   Health Maintenance, Female Adopting a healthy lifestyle and getting preventive care can go a long way to promote health and wellness. Talk with your health care provider about what schedule of regular examinations is right for you. This is a good chance for you to check in with your provider about disease prevention and staying healthy. In between checkups, there are plenty of things you can do on your own. Experts have done a lot of research about which lifestyle changes and preventive measures are most likely to keep you healthy. Ask your health care provider for more information. Weight and diet Eat a healthy diet  Be sure to include plenty of vegetables, fruits, low-fat dairy products, and lean protein.  Do not eat a lot of foods high in solid fats, added sugars, or salt.  Get regular exercise. This is one of the most important things you can do for your health. ? Most adults should exercise for at least 150 minutes each week. The exercise should increase your heart rate and make you sweat (moderate-intensity exercise). ? Most adults should also do strengthening exercises at least twice a week. This is in addition to the moderate-intensity exercise.  Maintain a healthy weight  Body mass index (BMI) is a measurement that can be used to identify possible weight problems. It estimates body fat based on height and weight. Your health care provider can help determine your BMI and help you achieve  or maintain a healthy weight.  For females 20 years of age and older: ? A BMI below 18.5 is considered underweight. ? A BMI of 18.5 to 24.9 is normal. ? A BMI of 25 to 29.9 is considered overweight. ? A BMI of 30 and above is considered obese.  Watch levels of cholesterol and blood lipids  You should start having your blood tested for lipids and cholesterol at 73 years of age, then have this test every 5 years.  You may need to have your cholesterol levels checked more often if: ? Your lipid or cholesterol levels are high. ? You are older than 73 years of age. ? You are at high risk for heart disease.  Cancer screening Lung Cancer  Lung cancer screening is recommended for adults 55-80 years old who are at high risk for lung cancer because of a history of smoking.  A yearly low-dose CT scan of the lungs is recommended for people who: ? Currently smoke. ? Have quit within the past 15 years. ? Have at least a 30-pack-year history of smoking. A pack year is smoking an average of one pack of cigarettes a day for 1 year.  Yearly screening should continue until it has been 15 years since you quit.  Yearly screening should stop if you develop a health problem that would prevent you from having lung cancer treatment.  Breast Cancer  Practice breast self-awareness. This means understanding how your breasts normally appear and feel.  It also means doing regular breast self-exams. Let your health care provider know about   changes, no matter how small.  If you are in your 20s or 30s, you should have a clinical breast exam (CBE) by a health care provider every 1-3 years as part of a regular health exam.  If you are 40 or older, have a CBE every year. Also consider having a breast X-ray (mammogram) every year.  If you have a family history of breast cancer, talk to your health care provider about genetic screening.  If you are at high risk for breast cancer, talk to your health care  provider about having an MRI and a mammogram every year.  Breast cancer gene (BRCA) assessment is recommended for women who have family members with BRCA-related cancers. BRCA-related cancers include: ? Breast. ? Ovarian. ? Tubal. ? Peritoneal cancers.  Results of the assessment will determine the need for genetic counseling and BRCA1 and BRCA2 testing.  Cervical Cancer Your health care provider may recommend that you be screened regularly for cancer of the pelvic organs (ovaries, uterus, and vagina). This screening involves a pelvic examination, including checking for microscopic changes to the surface of your cervix (Pap test). You may be encouraged to have this screening done every 3 years, beginning at age 21.  For women ages 30-65, health care providers may recommend pelvic exams and Pap testing every 3 years, or they may recommend the Pap and pelvic exam, combined with testing for human papilloma virus (HPV), every 5 years. Some types of HPV increase your risk of cervical cancer. Testing for HPV may also be done on women of any age with unclear Pap test results.  Other health care providers may not recommend any screening for nonpregnant women who are considered low risk for pelvic cancer and who do not have symptoms. Ask your health care provider if a screening pelvic exam is right for you.  If you have had past treatment for cervical cancer or a condition that could lead to cancer, you need Pap tests and screening for cancer for at least 20 years after your treatment. If Pap tests have been discontinued, your risk factors (such as having a new sexual partner) need to be reassessed to determine if screening should resume. Some women have medical problems that increase the chance of getting cervical cancer. In these cases, your health care provider may recommend more frequent screening and Pap tests.  Colorectal Cancer  This type of cancer can be detected and often prevented.  Routine  colorectal cancer screening usually begins at 73 years of age and continues through 73 years of age.  Your health care provider may recommend screening at an earlier age if you have risk factors for colon cancer.  Your health care provider may also recommend using home test kits to check for hidden blood in the stool.  A small camera at the end of a tube can be used to examine your colon directly (sigmoidoscopy or colonoscopy). This is done to check for the earliest forms of colorectal cancer.  Routine screening usually begins at age 50.  Direct examination of the colon should be repeated every 5-10 years through 73 years of age. However, you may need to be screened more often if early forms of precancerous polyps or small growths are found.  Skin Cancer  Check your skin from head to toe regularly.  Tell your health care provider about any new moles or changes in moles, especially if there is a change in a mole's shape or color.  Also tell your health care provider if   you have a mole that is larger than the size of a pencil eraser.  Always use sunscreen. Apply sunscreen liberally and repeatedly throughout the day.  Protect yourself by wearing long sleeves, pants, a wide-brimmed hat, and sunglasses whenever you are outside.  Heart disease, diabetes, and high blood pressure  High blood pressure causes heart disease and increases the risk of stroke. High blood pressure is more likely to develop in: ? People who have blood pressure in the high end of the normal range (130-139/85-89 mm Hg). ? People who are overweight or obese. ? People who are African American.  If you are 55-1 years of age, have your blood pressure checked every 3-5 years. If you are 86 years of age or older, have your blood pressure checked every year. You should have your blood pressure measured twice-once when you are at a hospital or clinic, and once when you are not at a hospital or clinic. Record the average of the  two measurements. To check your blood pressure when you are not at a hospital or clinic, you can use: ? An automated blood pressure machine at a pharmacy. ? A home blood pressure monitor.  If you are between 15 years and 81 years old, ask your health care provider if you should take aspirin to prevent strokes.  Have regular diabetes screenings. This involves taking a blood sample to check your fasting blood sugar level. ? If you are at a normal weight and have a low risk for diabetes, have this test once every three years after 73 years of age. ? If you are overweight and have a high risk for diabetes, consider being tested at a younger age or more often. Preventing infection Hepatitis B  If you have a higher risk for hepatitis B, you should be screened for this virus. You are considered at high risk for hepatitis B if: ? You were born in a country where hepatitis B is common. Ask your health care provider which countries are considered high risk. ? Your parents were born in a high-risk country, and you have not been immunized against hepatitis B (hepatitis B vaccine). ? You have HIV or AIDS. ? You use needles to inject street drugs. ? You live with someone who has hepatitis B. ? You have had sex with someone who has hepatitis B. ? You get hemodialysis treatment. ? You take certain medicines for conditions, including cancer, organ transplantation, and autoimmune conditions.  Hepatitis C  Blood testing is recommended for: ? Everyone born from [birth year] through 1967. ? Anyone with known risk factors for hepatitis C.  Sexually transmitted infections (STIs)  You should be screened for sexually transmitted infections (STIs) including gonorrhea and chlamydia if: ? You are sexually active and are younger than 73 years of age. ? You are older than 73 years of age and your health care provider tells you that you are at risk for this type of infection. ? Your sexual activity has changed since you  were last screened and you are at an increased risk for chlamydia or gonorrhea. Ask your health care provider if you are at risk.  If you do not have HIV, but are at risk, it may be recommended that you take a prescription medicine daily to prevent HIV infection. This is called pre-exposure prophylaxis (PrEP). You are considered at risk if: ? You are sexually active and do not regularly use condoms or know the HIV status of your partner(s). ? You take drugs by  injection. ? You are sexually active with a partner who has HIV.  Talk with your health care provider about whether you are at high risk of being infected with HIV. If you choose to begin PrEP, you should first be tested for HIV. You should then be tested every 3 months for as long as you are taking PrEP. Pregnancy  If you are premenopausal and you may become pregnant, ask your health care provider about preconception counseling.  If you may become pregnant, take 400 to 800 micrograms (mcg) of folic acid every day.  If you want to prevent pregnancy, talk to your health care provider about birth control (contraception). Osteoporosis and menopause  Osteoporosis is a disease in which the bones lose minerals and strength with aging. This can result in serious bone fractures. Your risk for osteoporosis can be identified using a bone density scan.  If you are 69 years of age or older, or if you are at risk for osteoporosis and fractures, ask your health care provider if you should be screened.  Ask your health care provider whether you should take a calcium or vitamin D supplement to lower your risk for osteoporosis.  Menopause may have certain physical symptoms and risks.  Hormone replacement therapy may reduce some of these symptoms and risks. Talk to your health care provider about whether hormone replacement therapy is right for you. Follow these instructions at home:  Schedule regular health, dental, and eye exams.  Stay current  with your immunizations.  Do not use any tobacco products including cigarettes, chewing tobacco, or electronic cigarettes.  If you are pregnant, do not drink alcohol.  If you are breastfeeding, limit how much and how often you drink alcohol.  Limit alcohol intake to no more than 1 drink per day for nonpregnant women. One drink equals 12 ounces of beer, 5 ounces of wine, or 1 ounces of hard liquor.  Do not use street drugs.  Do not share needles.  Ask your health care provider for help if you need support or information about quitting drugs.  Tell your health care provider if you often feel depressed.  Tell your health care provider if you have ever been abused or do not feel safe at home. This information is not intended to replace advice given to you by your health care provider. Make sure you discuss any questions you have with your health care provider. Document Released: 08/16/2010 Document Revised: 07/09/2015 Document Reviewed: 11/04/2014 Elsevier Interactive Patient Education  [employer].

## 2016-10-31 ENCOUNTER — Encounter: Payer: Self-pay | Admitting: Internal Medicine

## 2016-10-31 ENCOUNTER — Other Ambulatory Visit (INDEPENDENT_AMBULATORY_CARE_PROVIDER_SITE_OTHER): Payer: Medicare Other

## 2016-10-31 ENCOUNTER — Ambulatory Visit (INDEPENDENT_AMBULATORY_CARE_PROVIDER_SITE_OTHER): Payer: Medicare Other | Admitting: Internal Medicine

## 2016-10-31 VITALS — BP 172/104 | HR 77 | Temp 98.0°F | Resp 16 | Ht 61.2 in | Wt 139.0 lb

## 2016-10-31 DIAGNOSIS — K219 Gastro-esophageal reflux disease without esophagitis: Secondary | ICD-10-CM

## 2016-10-31 DIAGNOSIS — Z0001 Encounter for general adult medical examination with abnormal findings: Secondary | ICD-10-CM

## 2016-10-31 DIAGNOSIS — E559 Vitamin D deficiency, unspecified: Secondary | ICD-10-CM

## 2016-10-31 DIAGNOSIS — M85839 Other specified disorders of bone density and structure, unspecified forearm: Secondary | ICD-10-CM | POA: Diagnosis not present

## 2016-10-31 DIAGNOSIS — I10 Essential (primary) hypertension: Secondary | ICD-10-CM

## 2016-10-31 DIAGNOSIS — Z0000 Encounter for general adult medical examination without abnormal findings: Secondary | ICD-10-CM | POA: Diagnosis not present

## 2016-10-31 DIAGNOSIS — E1165 Type 2 diabetes mellitus with hyperglycemia: Secondary | ICD-10-CM | POA: Diagnosis not present

## 2016-10-31 DIAGNOSIS — E785 Hyperlipidemia, unspecified: Secondary | ICD-10-CM

## 2016-10-31 DIAGNOSIS — L539 Erythematous condition, unspecified: Secondary | ICD-10-CM | POA: Insufficient documentation

## 2016-10-31 DIAGNOSIS — Z23 Encounter for immunization: Secondary | ICD-10-CM

## 2016-10-31 LAB — LIPID PANEL
Cholesterol: 205 mg/dL — ABNORMAL HIGH (ref 0–200)
HDL: 71.2 mg/dL (ref >39.00)
LDL Cholesterol: 104 mg/dL — ABNORMAL HIGH (ref 0–99)
NonHDL: 133.75
Total CHOL/HDL Ratio: 3
Triglycerides: 148 mg/dL (ref 0.0–149.0)
VLDL: 29.6 mg/dL (ref 0.0–40.0)

## 2016-10-31 LAB — CBC WITH DIFFERENTIAL/PLATELET
Basophils Absolute: 0.1 10*3/uL (ref 0.0–0.1)
Basophils Relative: 1 % (ref 0.0–3.0)
Eosinophils Absolute: 0.2 10*3/uL (ref 0.0–0.7)
Eosinophils Relative: 1.9 % (ref 0.0–5.0)
HCT: 39.4 % (ref 36.0–46.0)
Hemoglobin: 13.4 g/dL (ref 12.0–15.0)
Lymphocytes Relative: 20.9 % (ref 12.0–46.0)
Lymphs Abs: 2.4 10*3/uL (ref 0.7–4.0)
MCHC: 34 g/dL (ref 30.0–36.0)
MCV: 94.8 fl (ref 78.0–100.0)
Monocytes Absolute: 0.7 10*3/uL (ref 0.1–1.0)
Monocytes Relative: 6.2 % (ref 3.0–12.0)
Neutro Abs: 7.9 10*3/uL — ABNORMAL HIGH (ref 1.4–7.7)
Neutrophils Relative %: 70 % (ref 43.0–77.0)
Platelets: 378 10*3/uL (ref 150.0–400.0)
RBC: 4.16 Mil/uL (ref 3.87–5.11)
RDW: 13.7 % (ref 11.5–15.5)
WBC: 11.2 10*3/uL — ABNORMAL HIGH (ref 4.0–10.5)

## 2016-10-31 LAB — COMPREHENSIVE METABOLIC PANEL
ALT: 25 U/L (ref 0–35)
AST: 17 U/L (ref 0–37)
Albumin: 4.3 g/dL (ref 3.5–5.2)
Alkaline Phosphatase: 85 U/L (ref 39–117)
BUN: 14 mg/dL (ref 6–23)
CO2: 27 mEq/L (ref 19–32)
Calcium: 10.1 mg/dL (ref 8.4–10.5)
Chloride: 102 mEq/L (ref 96–112)
Creatinine, Ser: 0.89 mg/dL (ref 0.40–1.20)
GFR: 66.07 mL/min (ref >60.00)
Glucose, Bld: 120 mg/dL — ABNORMAL HIGH (ref 70–99)
Potassium: 4 mEq/L (ref 3.5–5.1)
Sodium: 140 mEq/L (ref 135–145)
Total Bilirubin: 0.9 mg/dL (ref 0.2–1.2)
Total Protein: 8.1 g/dL (ref 6.0–8.3)

## 2016-10-31 LAB — VITAMIN D 25 HYDROXY (VIT D DEFICIENCY, FRACTURES): VITD: 44.56 ng/mL (ref 30.00–100.00)

## 2016-10-31 LAB — TSH: TSH: 2.84 u[IU]/mL (ref 0.35–4.50)

## 2016-10-31 LAB — HEMOGLOBIN A1C: Hgb A1c MFr Bld: 6.3 % (ref 4.6–6.5)

## 2016-10-31 NOTE — Assessment & Plan Note (Signed)
Taking vitamin d daily Will check level

## 2016-10-31 NOTE — Assessment & Plan Note (Signed)
Chronic, unchanged and asymptomatic ? Related to chronic use of steroid cream Dermatology is not sure of cause Will monitor for now since it is unchanged, chronic and asymptomatic

## 2016-10-31 NOTE — Assessment & Plan Note (Signed)
Check a1c Low sugar / carb diet Stressed regular exercise, weight loss  

## 2016-10-31 NOTE — Assessment & Plan Note (Addendum)
Check lipid panel  Continue zetia every other day - she did stop it for a while Regular exercise and healthy diet encouraged

## 2016-10-31 NOTE — Assessment & Plan Note (Signed)
Deferred dexa Can not take calcium Taking vitamin d Exercising irregularly

## 2016-10-31 NOTE — Assessment & Plan Note (Signed)
Having increased GERD symptoms related to taking doxycycline  Advised increasing prilosec temporarily

## 2016-10-31 NOTE — Assessment & Plan Note (Signed)
Better controlled at home She will continue to monitor at home given her white coat htn Current regimen effective and well tolerated Continue current medications at current doses cmp

## 2017-02-16 ENCOUNTER — Other Ambulatory Visit: Payer: Self-pay | Admitting: Internal Medicine

## 2017-03-09 ENCOUNTER — Other Ambulatory Visit: Payer: Self-pay | Admitting: Internal Medicine

## 2017-03-15 ENCOUNTER — Telehealth: Payer: Self-pay | Admitting: Internal Medicine

## 2017-03-15 NOTE — Telephone Encounter (Signed)
Spoke with [name] regarding AWV. Pt stated declined to schedule appointment. SF.

## 2017-04-30 NOTE — Patient Instructions (Addendum)
  Test(s) ordered today. Your results will be released to [facility] (or called to you) after review, usually within 72hours after test completion. If any changes need to be made, you will be notified at that same time.  Medications reviewed and updated.  No changes recommended at this time.  Your prescription(s) have been submitted to your pharmacy. Please take as directed and contact our office if you believe you are having problem(s) with the medication(s).  A thyroid ultrasound was ordered.   Please followup in 6 months

## 2017-04-30 NOTE — Progress Notes (Signed)
Subjective:    Patient ID: [name], female    DOB: 03-17-1945, 72 y.o.   MRN: [id number]  HPI The patient is here for follow up.  Fatigue:  For the past few months she has been very tired.  She is not been exercising as much and has gained some weight.  Lump in throat:  She has a lump in her throat sometimes when she swallows.  She was concerned this may be her thyroid.  She does have a history of thyroid nodules.   She does have difficulty swallowing at times and there does not seem to be any obvious pattern to it.  She does need to drink liquid when she is eating.  Her skin is dry and she is losing hair much more than she should.  She has gained weight.    She has periodic, but consistent joint pain in toes.  She was not sure if that is from arthritis.  She has not needed to take anything. The pain comes and lasts a few days.  The joint gets a little swollen and feels hot.  She puts cortisone cream on it.  She has had two attacks.  She has applied topical cortisone liquid and it helps.  She has periodic knee pain.  She is chronic redness in her toes likely from using steroid cream on a daily basis.  Hypertension: She is taking her medication daily. She is compliant with a low sodium diet.  She denies chest pain, palpitations and regular headaches. She is exercising some.  She does monitor her blood pressure at home - 114/82 - 139/81.    Hyperlipidemia: She is taking her medication daily. She is compliant with a low fat/cholesterol diet. She is exercising some. She denies myalgias.   Diabetes: She is taking her medication daily as prescribed. She is compliant with a diabetic diet. She is exercising some - walks 5000-7000 steps a day. She checks her feet daily and denies foot lesions. She is up-to-date with an ophthalmology examination.     Medications and allergies reviewed with patient and updated if appropriate.  Patient Active Problem List   Diagnosis Date Noted  .  Vitamin D deficiency 10/31/2016  . Redness of skin, feet 10/31/2016  . Tick bite 06/06/2016  . Diastolic dysfunction 42/70/6237  . BPPV (benign paroxysmal positional vertigo), bilateral 12/24/2015  . Fatigue 12/24/2015  . Nonspecific abnormal electrocardiogram (ECG) (EKG) 12/24/2015  . Bilateral carotid artery disease, Mild 05/31/2015  . Thyroid nodule 05/31/2015  . Psoriasis   . Scoliosis   . Diabetes type 2, controlled ([place]) 06/03/2008  . Dyslipidemia 06/03/2008  . CARPAL TUNNEL SYNDROME 06/03/2008  . Essential hypertension 06/03/2008  . ALLERGIC RHINITIS 06/03/2008  . Asthma 06/03/2008  . GERD 06/03/2008  . Osteopenia 06/03/2008  . COLONIC POLYPS, HX OF 06/03/2008    Current Outpatient Medications on File Prior to Visit  Medication Sig Dispense Refill  . BENICAR 40 MG tablet TAKE 1/2 TABLET BY MOUTH DAILY. TAKE 1/2TABLET IF BLOOD PRESSURE IS GREATER THAN140/90 AS NEEDED 30 tablet 1  . clobetasol (TEMOVATE) 0.05 % external solution Apply 1 application topically at bedtime.     Marland Kitchen doxylamine, Sleep, (UNISOM) 25 MG tablet Take 15 mg by mouth at bedtime as needed for sleep.     Scarlette Shorts SURECLICK 50 MG/ML injection Inject into the skin once a week. Once a week sometimes on sundays or mondays. Patient unsure of dose    . halobetasol (ULTRAVATE) 0.05 %  cream Apply topically 2 (two) times daily.    Marland Kitchen ibuprofen (ADVIL,MOTRIN) 200 MG tablet Take 200 mg by mouth every 6 (six) hours as needed for mild pain.    Marland Kitchen loratadine (CLARITIN REDITABS) 10 MG dissolvable tablet Take 10 mg by mouth every other day as needed for allergies.    . Multiple Vitamin (MULTIVITAMIN) tablet Take 1 tablet by mouth daily.      Marland Kitchen omeprazole (PRILOSEC) 20 MG capsule Take 20 mg by mouth daily. OTC    . oxymetazoline (AFRIN 12 HOUR) 0.05 % nasal spray Place 1 spray into the nose daily.      Marland Kitchen ZETIA 10 MG tablet TAKE ONE (1) TABLET BY MOUTH EVERY DAY (Patient taking differently: TAKE ONE (1) TABLET BY MOUTH EVERY OTHER  DAY) 90 tablet 1   No current facility-administered medications on file prior to visit.     Past Medical History:  Diagnosis Date  . ALLERGIC RHINITIS   . ANEMIA-NOS   . ASTHMA   . Carpal tunnel syndrome   . COLONIC POLYPS, HX OF   . DIABETES MELLITUS, TYPE II    diet controlled  . GERD   . HYPERLIPIDEMIA   . HYPERTENSION   . OSTEOPENIA   . Psoriasis    severe, began soriatane 01/2012  . Rectal fissure   . Scoliosis     Past Surgical History:  Procedure Laterality Date  . benign rectal growth  2004   removed by Dr. Zella Richer  . BREAST SURGERY  1988   biopsy  . CESAREAN SECTION      Social History   Socioeconomic History  . Marital status: Single    Spouse name: None  . Number of children: None  . Years of education: None  . Highest education level: None  Social Needs  . Financial resource strain: None  . Food insecurity - worry: None  . Food insecurity - inability: None  . Transportation needs - medical: None  . Transportation needs - non-medical: None  Occupational History  . None  Tobacco Use  . Smoking status: Former Smoker    Last attempt to quit: 02/14/1974    Years since quitting: 43.2  . Smokeless tobacco: Never Used  Substance and Sexual Activity  . Alcohol use: No  . Drug use: No  . Sexual activity: None  Other Topics Concern  . None  Social History Narrative  . None    Family History  Problem Relation Age of Onset  . Lung cancer Father   . Arthritis Other        Parents  . Asthma Other        parent, other relative  . Breast cancer Other        other relative  . Hypertension Other        parent, other relative  . Heart disease Other        parent, other relative    Review of Systems  Constitutional: Positive for fatigue. Negative for chills and fever.  HENT: Positive for trouble swallowing (no pattern, need liquid).   Respiratory: Positive for cough (? benicar) and shortness of breath (with activity sometimes - definitely with  hills). Negative for wheezing.   Cardiovascular: Positive for leg swelling (sometimes on left, mild). Negative for chest pain and palpitations.  Gastrointestinal: Positive for anal bleeding (fissure). Negative for abdominal pain, blood in stool, constipation, diarrhea and nausea.       GERD controlled - prilosec daily  Skin:  Dry skin, hair loss  Neurological: Positive for light-headedness (rare) and headaches (rare).       Objective:   Vitals:   05/01/17 0829  BP: (!) 152/106  Pulse: 85  Resp: 16  Temp: 97.9 F (36.6 C)  SpO2: 97%   BP Readings from Last 3 Encounters:  05/01/17 (!) 152/106  10/31/16 (!) 172/104  06/06/16 (!) 156/102   Wt Readings from Last 3 Encounters:  05/01/17 144 lb (65.3 kg)  10/31/16 139 lb (63 kg)  06/06/16 142 lb (64.4 kg)   Body mass index is 27.03 kg/m.   Physical Exam    Constitutional: Appears well-developed and well-nourished. No distress.  HENT:  Head: Normocephalic and atraumatic.  Neck: Neck supple. No tracheal deviation present. No thyromegaly present.  No cervical lymphadenopathy Cardiovascular: Normal rate, regular rhythm and normal heart sounds.   No murmur heard. No carotid bruit .  No edema Pulmonary/Chest: Effort normal and breath sounds normal. No respiratory distress. No has no wheezes. No rales.  Abdomen:  Soft, nt, nd Msk: no joint swelling in toes or deformities Skin: Skin is warm and dry. Not diaphoretic. generalized erythema all toes Psychiatric: Normal mood and affect. Behavior is normal.    Diabetic Foot Exam - Simple   Simple Foot Form Diabetic Foot exam was performed with the following findings:  Yes 05/01/2017  8:52 AM  Visual Inspection No deformities, no ulcerations, no other skin breakdown bilaterally:  Yes Sensation Testing Intact to touch and monofilament testing bilaterally:  Yes Pulse Check Posterior Tibialis and Dorsalis pulse intact bilaterally:  Yes Comments Erythematous toes - likely from  chronic steroid use       Assessment & Plan:    See Problem List for Assessment and Plan of chronic medical problems.

## 2017-05-01 ENCOUNTER — Other Ambulatory Visit (INDEPENDENT_AMBULATORY_CARE_PROVIDER_SITE_OTHER): Payer: Medicare Other

## 2017-05-01 ENCOUNTER — Encounter: Payer: Self-pay | Admitting: Internal Medicine

## 2017-05-01 ENCOUNTER — Ambulatory Visit (INDEPENDENT_AMBULATORY_CARE_PROVIDER_SITE_OTHER): Payer: Medicare Other | Admitting: Internal Medicine

## 2017-05-01 VITALS — BP 152/106 | HR 85 | Temp 97.9°F | Resp 16 | Wt 144.0 lb

## 2017-05-01 DIAGNOSIS — R5383 Other fatigue: Secondary | ICD-10-CM | POA: Diagnosis not present

## 2017-05-01 DIAGNOSIS — K219 Gastro-esophageal reflux disease without esophagitis: Secondary | ICD-10-CM | POA: Diagnosis not present

## 2017-05-01 DIAGNOSIS — E1165 Type 2 diabetes mellitus with hyperglycemia: Secondary | ICD-10-CM

## 2017-05-01 DIAGNOSIS — I10 Essential (primary) hypertension: Secondary | ICD-10-CM

## 2017-05-01 DIAGNOSIS — E785 Hyperlipidemia, unspecified: Secondary | ICD-10-CM | POA: Diagnosis not present

## 2017-05-01 DIAGNOSIS — E041 Nontoxic single thyroid nodule: Secondary | ICD-10-CM

## 2017-05-01 DIAGNOSIS — L659 Nonscarring hair loss, unspecified: Secondary | ICD-10-CM | POA: Insufficient documentation

## 2017-05-01 DIAGNOSIS — M2550 Pain in unspecified joint: Secondary | ICD-10-CM | POA: Insufficient documentation

## 2017-05-01 LAB — URIC ACID: Uric Acid, Serum: 7.9 mg/dL — ABNORMAL HIGH (ref 2.4–7.0)

## 2017-05-01 LAB — T4, FREE: Free T4: 0.75 ng/dL (ref 0.60–1.60)

## 2017-05-01 LAB — COMPREHENSIVE METABOLIC PANEL
ALT: 27 U/L (ref 0–35)
AST: 19 U/L (ref 0–37)
Albumin: 4.3 g/dL (ref 3.5–5.2)
Alkaline Phosphatase: 76 U/L (ref 39–117)
BUN: 17 mg/dL (ref 6–23)
CO2: 27 mEq/L (ref 19–32)
Calcium: 10.1 mg/dL (ref 8.4–10.5)
Chloride: 102 mEq/L (ref 96–112)
Creatinine, Ser: 0.92 mg/dL (ref 0.40–1.20)
GFR: 63.51 mL/min (ref >60.00)
Glucose, Bld: 121 mg/dL — ABNORMAL HIGH (ref 70–99)
Potassium: 4.3 mEq/L (ref 3.5–5.1)
Sodium: 139 mEq/L (ref 135–145)
Total Bilirubin: 0.8 mg/dL (ref 0.2–1.2)
Total Protein: 7.9 g/dL (ref 6.0–8.3)

## 2017-05-01 LAB — LDL CHOLESTEROL, DIRECT: Direct LDL: 140 mg/dL

## 2017-05-01 LAB — LIPID PANEL
Cholesterol: 228 mg/dL — ABNORMAL HIGH (ref 0–200)
HDL: 54.4 mg/dL (ref >39.00)
NonHDL: 173.47
Total CHOL/HDL Ratio: 4
Triglycerides: 211 mg/dL — ABNORMAL HIGH (ref 0.0–149.0)
VLDL: 42.2 mg/dL — ABNORMAL HIGH (ref 0.0–40.0)

## 2017-05-01 LAB — CBC WITH DIFFERENTIAL/PLATELET
Basophils Absolute: 0.1 10*3/uL (ref 0.0–0.1)
Basophils Relative: 1.7 % (ref 0.0–3.0)
Eosinophils Absolute: 0.3 10*3/uL (ref 0.0–0.7)
Eosinophils Relative: 4 % (ref 0.0–5.0)
HCT: 42.1 % (ref 36.0–46.0)
Hemoglobin: 14.1 g/dL (ref 12.0–15.0)
Lymphocytes Relative: 28.5 % (ref 12.0–46.0)
Lymphs Abs: 2.3 10*3/uL (ref 0.7–4.0)
MCHC: 33.5 g/dL (ref 30.0–36.0)
MCV: 94.1 fl (ref 78.0–100.0)
Monocytes Absolute: 0.6 10*3/uL (ref 0.1–1.0)
Monocytes Relative: 6.9 % (ref 3.0–12.0)
Neutro Abs: 4.8 10*3/uL (ref 1.4–7.7)
Neutrophils Relative %: 58.9 % (ref 43.0–77.0)
Platelets: 301 10*3/uL (ref 150.0–400.0)
RBC: 4.47 Mil/uL (ref 3.87–5.11)
RDW: 13.9 % (ref 11.5–15.5)
WBC: 8.1 10*3/uL (ref 4.0–10.5)

## 2017-05-01 LAB — HEMOGLOBIN A1C: Hgb A1c MFr Bld: 6.4 % (ref 4.6–6.5)

## 2017-05-01 LAB — TSH: TSH: 3.01 u[IU]/mL (ref 0.35–4.50)

## 2017-05-01 LAB — VITAMIN B12: Vitamin B-12: 426 pg/mL (ref 211–911)

## 2017-05-01 MED ORDER — BENICAR 40 MG PO TABS: 40.0000 mg | ORAL_TABLET | Freq: Every day | ORAL | 3 refills | Status: DC

## 2017-05-01 MED ORDER — ONETOUCH ULTRASOFT LANCETS MISC: 11 refills | Status: DC

## 2017-05-01 MED ORDER — BLOOD GLUCOSE MONITOR KIT: PACK | 0 refills | Status: DC

## 2017-05-01 MED ORDER — GLUCOSE BLOOD VI STRP: ORAL_STRIP | 1 refills | Status: DC

## 2017-05-01 NOTE — Assessment & Plan Note (Addendum)
Experiencsing knee pain, toe pain at MCP joints Possible gout Check uric acid level

## 2017-05-01 NOTE — Assessment & Plan Note (Signed)
Diet controlled Check a1c Low sugar / carb diet Stressed regular exercise

## 2017-05-01 NOTE — Assessment & Plan Note (Signed)
Mild hair loss Will check B12 level, TSH

## 2017-05-01 NOTE — Assessment & Plan Note (Signed)
?    Cause  check blood work including CBC, TSH, free T4 She is exercising less and has gained weight-both may be contributing

## 2017-05-01 NOTE — Assessment & Plan Note (Signed)
Blood pressure elevated here today, but well controlled at home-she did bring her readings continue current medication at current doses CMP

## 2017-05-01 NOTE — Assessment & Plan Note (Signed)
GERD is controlled she takes her medication daily She does experience occasional dysphasia-if ultrasound of thyroid is negative advised her to follow-up with GI

## 2017-05-01 NOTE — Assessment & Plan Note (Signed)
Small bilateral thyroid nodules on ultrasound in 2017-unlikely the cause of her difficulty swallowing at times, but will recheck ultrasound-ordered

## 2017-05-01 NOTE — Assessment & Plan Note (Addendum)
Statin intolerant Tolerating Zetia every other day-we will continue  Check lipid panel Encouraged continuing regular exercise, heart healthy diet

## 2017-05-03 ENCOUNTER — Encounter: Payer: Self-pay | Admitting: Emergency Medicine

## 2017-05-03 ENCOUNTER — Encounter: Payer: Self-pay | Admitting: Internal Medicine

## 2017-05-03 DIAGNOSIS — E790 Hyperuricemia without signs of inflammatory arthritis and tophaceous disease: Secondary | ICD-10-CM | POA: Insufficient documentation

## 2017-05-04 ENCOUNTER — Telehealth: Payer: Self-pay | Admitting: Emergency Medicine

## 2017-05-04 ENCOUNTER — Other Ambulatory Visit: Payer: Self-pay | Admitting: Internal Medicine

## 2017-05-04 MED ORDER — INDOMETHACIN 50 MG PO CAPS: 50.0000 mg | ORAL_CAPSULE | Freq: Three times a day (TID) | ORAL | 1 refills | Status: DC

## 2017-05-04 NOTE — Telephone Encounter (Signed)
PA completed for Indomethacin- [id number]. Awaiting Response.

## 2017-05-05 ENCOUNTER — Encounter: Payer: Self-pay | Admitting: Family Medicine

## 2017-05-05 ENCOUNTER — Ambulatory Visit: Payer: Self-pay | Admitting: *Deleted

## 2017-05-05 ENCOUNTER — Ambulatory Visit (INDEPENDENT_AMBULATORY_CARE_PROVIDER_SITE_OTHER): Payer: Medicare Other | Admitting: Family Medicine

## 2017-05-05 VITALS — BP 150/110 | HR 82 | Temp 98.2°F | Wt 142.7 lb

## 2017-05-05 DIAGNOSIS — R030 Elevated blood-pressure reading, without diagnosis of hypertension: Secondary | ICD-10-CM | POA: Diagnosis not present

## 2017-05-05 DIAGNOSIS — R42 Dizziness and giddiness: Secondary | ICD-10-CM

## 2017-05-05 MED ORDER — ONDANSETRON 4 MG PO TBDP: 4.0000 mg | ORAL_TABLET | Freq: Three times a day (TID) | ORAL | 0 refills | Status: DC | PRN

## 2017-05-05 MED ORDER — DIAZEPAM 2 MG PO TABS: ORAL_TABLET | ORAL | 0 refills | Status: DC

## 2017-05-05 MED ORDER — SULINDAC 150 MG PO TABS: 150.0000 mg | ORAL_TABLET | Freq: Two times a day (BID) | ORAL | 5 refills | Status: DC

## 2017-05-05 NOTE — Telephone Encounter (Signed)
Alternative sent

## 2017-05-05 NOTE — Progress Notes (Signed)
Subjective:     Patient ID: [name], female   DOB: 29-Sep-1945, 72 y.o.   MRN: [id number]  HPI Patient seen with chief complaint of dizziness. She had similar episode back in November 2017 when she went to emergency room. She had workup including CT of head then along with MRI which were unremarkable. She was given Zofran which helped her nausea and prescribed meclizine which she never filled. She eventually received low-dose diazepam which seemed to help. She tried Epley maneuvers on just a couple of occasions.   These current symptoms are very similar. Onset yesterday.  She has sensation of vertigo which is worse with head movement. No syncope or orthostatic symptoms.  Mild nausea but no vomiting. No headache. No focal weakness. No speech changes. No visual changes.  She states she has long-standing history of "whitecoat syndrome". She brings in long list of home blood pressures which have been well controlled  Past Medical History:  Diagnosis Date  . ALLERGIC RHINITIS   . ANEMIA-NOS   . ASTHMA   . Carpal tunnel syndrome   . COLONIC POLYPS, HX OF   . DIABETES MELLITUS, TYPE II    diet controlled  . GERD   . HYPERLIPIDEMIA   . HYPERTENSION   . OSTEOPENIA   . Psoriasis    severe, began soriatane 01/2012  . Rectal fissure   . Scoliosis    Past Surgical History:  Procedure Laterality Date  . benign rectal growth  2004   removed by Dr. Zella Richer  . BREAST SURGERY  1988   biopsy  . CESAREAN SECTION      reports that she quit smoking about 43 years ago. She has never used smokeless tobacco. She reports that she does not drink alcohol or use drugs. family history includes Arthritis in her other; Asthma in her other; Breast cancer in her other; Heart disease in her other; Hypertension in her other; Lung cancer in her father. Allergies  Allergen Reactions  . Clarithromycin     Nausea, I can pass out   . Codeine     Nausea, I can pass out   . Erythromycin     Other  reaction(s): Vomiting (intolerance)  . Statins     Muscle pain   . Tetracycline      Review of Systems  Constitutional: Negative for chills and fever.  Respiratory: Negative for shortness of breath.   Cardiovascular: Negative for chest pain.  Gastrointestinal: Positive for nausea. Negative for abdominal pain and vomiting.  Genitourinary: Negative for dysuria.  Neurological: Positive for dizziness. Negative for seizures, syncope, speech difficulty, weakness, light-headedness and headaches.  Psychiatric/Behavioral: Negative for confusion.       Objective:   Physical Exam  Constitutional: She is oriented to person, place, and time. She appears well-developed and well-nourished.  Cardiovascular: Normal rate and regular rhythm.  Pulmonary/Chest: Effort normal and breath sounds normal. No respiratory distress. She has no wheezes. She has no rales.  Neurological: She is alert and oriented to person, place, and time. No cranial nerve deficit. Coordination normal.  She has some mild horizontal nystagmus Normal finger to nose testing No focal weakness       Assessment:     #1 recurrent vertigo. Symptoms worse with head to the left. Suspect benign peripheral positional vertigo  #2 elevated blood pressure. Reported history of whitecoat syndrome    Plan:     -Refilled ondansetron for as needed use for nausea -Refilled a few low-dose diazepam 2 mg every  6-8 hours as needed which she states helped in the past -Handout on Epley maneuvers given -Follow-up with primary if symptoms not resolving in the next week -consider vestibular PT if symptoms persist.    Eulas Post MD [facility] at [facility]

## 2017-05-05 NOTE — Telephone Encounter (Signed)
Pt called with complaints of dizziness, nausea, and "feeling like my balance is off"; her CBG is 132 this morning, nurse triage initiated and recommendations made per protocol to include seeing a physician within 24 hours; pt normally sees Dr Quay Burow but she has no appointments available today; the pt further states that she "does not want to be seen by a practitioner"; no MD appointments available today at [facility]; pt offered and accepted appointment with Dr Carolann Littler, [facility], 05/05/17 at 1045; pt verbalizes understanding; will route to both offices for notification of this upcoming appointment and notification of this encounter. Reason for Disposition . [1] MODERATE dizziness (e.g., interferes with normal activities) AND [2] has NOT been evaluated by physician for this  (Exception: dizziness caused by heat exposure, sudden standing, or poor fluid intake)  Answer Assessment - Initial Assessment Questions 1. DESCRIPTION: "Describe your dizziness."     "feels like I can't walk or keep balance" 2. LIGHTHEADED: "Do you feel lightheaded?" (e.g., somewhat faint, woozy, weak upon standing)     Feels tired 3. VERTIGO: "Do you feel like either you or the room is spinning or tilting?" (i.e. vertigo)     no 4. SEVERITY: "How bad is it?"  "Do you feel like you are going to faint?" "Can you stand and walk?"   - MILD - walking normally   - MODERATE - interferes with normal activities (e.g., work, school)    - SEVERE - unable to stand, requires support to walk, feels like passing out now.      moderate 5. ONSET:  "When did the dizziness begin?"     05/04/17 6. AGGRAVATING FACTORS: "Does anything make it worse?" (e.g., standing, change in head position)     Bending down 7. HEART RATE: "Can you tell me your heart rate?" "How many beats in 15 seconds?"  (Note: not all patients can do this)       21 beats in 15 seconds 8. CAUSE: "What do you think is causing the dizziness?"     unsure 9. RECURRENT  SYMPTOM: "Have you had dizziness before?" If so, ask: "When was the last time?" "What happened that time?"     Yes vertigo 10. OTHER SYMPTOMS: "Do you have any other symptoms?" (e.g., fever, chest pain, vomiting, diarrhea, bleeding)       nausea 11. PREGNANCY: "Is there any chance you are pregnant?" "When was your last menstrual period?"       no  Protocols used: DIZZINESS [facility]

## 2017-05-05 NOTE — Telephone Encounter (Signed)
Spoke with pt to advise alternative was sent to [facility]

## 2017-05-05 NOTE — Telephone Encounter (Signed)
PA will not be approved until pt has tried mitigare/ colchicine/ sulindac. Please send in alternative.

## 2017-05-05 NOTE — Telephone Encounter (Signed)
Spoke with pt this morning, advised if symptoms got worse before appt to go to the ED. She did advise that someone was driving her to appt.

## 2017-05-16 ENCOUNTER — Other Ambulatory Visit: Payer: Self-pay | Admitting: Internal Medicine

## 2017-05-16 DIAGNOSIS — E041 Nontoxic single thyroid nodule: Secondary | ICD-10-CM

## 2017-05-26 ENCOUNTER — Ambulatory Visit
Admission: RE | Admit: 2017-05-26 | Discharge: 2017-05-26 | Disposition: A | Discharge status: Home / Self Care | Payer: Medicare Other | Source: Ambulatory Visit | Attending: Internal Medicine | Admitting: Internal Medicine

## 2017-05-26 DIAGNOSIS — E041 Nontoxic single thyroid nodule: Secondary | ICD-10-CM

## 2017-05-29 DIAGNOSIS — R42 Dizziness and giddiness: Secondary | ICD-10-CM | POA: Insufficient documentation

## 2017-05-29 NOTE — Progress Notes (Signed)
Subjective:    Patient ID: [name], female    DOB: Sep 09, 1945, 72 y.o.   MRN: [id number]  HPI The patient is here for an acute visit.   Dizziness:  She saw Dr Elease Hashimoto on 3/22 for dizziness.  It felt like she is on a boat and is rocking.  She had a similar episode in 2017 and had a CT and MRI of her head at that time, which were normal.  Valium helped.  Her vertigo is worse with head movement.  She has mild nausea.  There were no concerning symptoms.  She received ondansetron, valium and a handout of epley maneuvers.  He discussed vestibular PT if symptoms persisted.   Her symptoms have improved, but are not gone.  She took both valium and zofran 3/day for 2-3 days and then tapered off.  She no longer feels like she is on the boat, but if she lays down she feels a fainting like feeling for about 5 minutes.  Once she gets up she has to sit there for several movements.  She still feels off balanced overall.    Fatigue, SOB:  She has had some fatigue and increased sob with walking a few months ago.  Since the dizziness started she has to stop more often to catch her breath and is not able to walk as far.    She has been bruising more frequently recently - it has been that for years, but it has gotten worse.  She has bruising on her arms, legs and neck.    This morning she woke up with a bitter taste in her mouth.  This is new.  It was causing some nausea.  She has chronic drainage from her sinuses and there has been no change.   Hypertension: She is taking her medication daily. She is compliant with a low sodium diet. She is exercising regularly.  She does monitor her blood pressure at home - it is 140-150's, which is higher than normal.  Whenever she does not feel well her BP does go up.     Medications and allergies reviewed with patient and updated if appropriate.  Patient Active Problem List   Diagnosis Date Noted  . Dizziness 05/29/2017  . Hyperuricemia 05/03/2017  .  Arthralgia 05/01/2017  . Hair loss 05/01/2017  . Vitamin D deficiency 10/31/2016  . Redness of skin, feet 10/31/2016  . Diastolic dysfunction 42/68/3419  . BPPV (benign paroxysmal positional vertigo), bilateral 12/24/2015  . Fatigue 12/24/2015  . Nonspecific abnormal electrocardiogram (ECG) (EKG) 12/24/2015  . Bilateral carotid artery disease, Mild 05/31/2015  . Thyroid nodule 05/31/2015  . Psoriasis   . Scoliosis   . Diabetes type 2, controlled ([place]) 06/03/2008  . Dyslipidemia 06/03/2008  . CARPAL TUNNEL SYNDROME 06/03/2008  . Essential hypertension 06/03/2008  . ALLERGIC RHINITIS 06/03/2008  . Asthma 06/03/2008  . GERD 06/03/2008  . Osteopenia 06/03/2008  . COLONIC POLYPS, HX OF 06/03/2008    Current Outpatient Medications on File Prior to Visit  Medication Sig Dispense Refill  . BENICAR 40 MG tablet Take 1 tablet (40 mg total) by mouth daily. 90 tablet 3  . blood glucose meter kit and supplies KIT Dispense based on patient and insurance preference. ONE TOUCH ULTRA 1 each 0  . Cholecalciferol (VITAMIN D3) 2000 units TABS Take by mouth.    . clobetasol (TEMOVATE) 0.05 % external solution Apply 1 application topically at bedtime.     . Coenzyme Q10 (COQ10) 200 MG CAPS  Take 1 capsule by mouth daily.    . diazepam (VALIUM) 2 MG tablet Take one every 6 hours as needed for vertigo 30 tablet 0  . doxylamine, Sleep, (UNISOM) 25 MG tablet Take 15 mg by mouth at bedtime as needed for sleep.     Scarlette Shorts SURECLICK 50 MG/ML injection Inject into the skin once a week. Once a week sometimes on sundays or mondays. Patient unsure of dose    . glucose blood (ONE TOUCH ULTRA TEST) test strip Use test strip to check blood sugar at least 3 times 300 each 1  . halobetasol (ULTRAVATE) 0.05 % cream Apply topically 2 (two) times daily.    Marland Kitchen ibuprofen (ADVIL,MOTRIN) 200 MG tablet Take 200 mg by mouth every 6 (six) hours as needed for mild pain.    . Lancets (ONETOUCH ULTRASOFT) lancets Use lancet to  test blood sugar at least twice a day or as directed by your physician. 200 each 11  . loratadine (CLARITIN REDITABS) 10 MG dissolvable tablet Take 10 mg by mouth every other day as needed for allergies.    . Multiple Vitamin (MULTIVITAMIN) tablet Take 1 tablet by mouth daily.      Marland Kitchen omeprazole (PRILOSEC) 20 MG capsule Take 20 mg by mouth daily. OTC    . ondansetron (ZOFRAN ODT) 4 MG disintegrating tablet Take 1 tablet (4 mg total) by mouth every 8 (eight) hours as needed for nausea or vomiting. 20 tablet 0  . oxymetazoline (AFRIN 12 HOUR) 0.05 % nasal spray Place 1 spray into the nose daily.      . sulindac (CLINORIL) 150 MG tablet Take 1 tablet (150 mg total) by mouth 2 (two) times daily. Take for 7 days prn for gout 28 tablet 5  . ZETIA 10 MG tablet TAKE ONE (1) TABLET BY MOUTH EVERY DAY (Patient taking differently: TAKE ONE (1) TABLET BY MOUTH EVERY OTHER DAY) 90 tablet 1   No current facility-administered medications on file prior to visit.     Past Medical History:  Diagnosis Date  . ALLERGIC RHINITIS   . ANEMIA-NOS   . ASTHMA   . Carpal tunnel syndrome   . COLONIC POLYPS, HX OF   . DIABETES MELLITUS, TYPE II    diet controlled  . GERD   . HYPERLIPIDEMIA   . HYPERTENSION   . OSTEOPENIA   . Psoriasis    severe, began soriatane 01/2012  . Rectal fissure   . Scoliosis     Past Surgical History:  Procedure Laterality Date  . benign rectal growth  2004   removed by Dr. Zella Richer  . BREAST SURGERY  1988   biopsy  . CESAREAN SECTION      Social History   Socioeconomic History  . Marital status: Single    Spouse name: Not on file  . Number of children: Not on file  . Years of education: Not on file  . Highest education level: Not on file  Occupational History  . Not on file  Social Needs  . Financial resource strain: Not on file  . Food insecurity:    Worry: Not on file    Inability: Not on file  . Transportation needs:    Medical: Not on file    Non-medical:  Not on file  Tobacco Use  . Smoking status: Former Smoker    Last attempt to quit: 02/14/1974    Years since quitting: 43.3  . Smokeless tobacco: Never Used  Substance and Sexual Activity  .  Alcohol use: No  . Drug use: No  . Sexual activity: Not on file  Lifestyle  . Physical activity:    Days per week: Not on file    Minutes per session: Not on file  . Stress: Not on file  Relationships  . Social connections:    Talks on phone: Not on file    Gets together: Not on file    Attends religious service: Not on file    Active member of club or organization: Not on file    Attends meetings of clubs or organizations: Not on file    Relationship status: Not on file  Other Topics Concern  . Not on file  Social History Narrative  . Not on file    Family History  Problem Relation Age of Onset  . Lung cancer Father   . Arthritis Other        Parents  . Asthma Other        parent, other relative  . Breast cancer Other        other relative  . Hypertension Other        parent, other relative  . Heart disease Other        parent, other relative    Review of Systems  Constitutional: Negative for chills and fever.  HENT: Positive for postnasal drip (right side) and voice change. Negative for sore throat.   Respiratory: Positive for cough (this morning from bitter taste) and shortness of breath. Negative for wheezing.   Cardiovascular: Negative for chest pain, palpitations and leg swelling.  Gastrointestinal: Positive for nausea (with bitterness in throat).       No gerd  Musculoskeletal: Positive for back pain.  Neurological: Negative for weakness, numbness and headaches.  Hematological: Bruises/bleeds easily.       Objective:   Vitals:   05/30/17 0747  BP: (!) 228/110  Pulse: 88  Resp: 16  Temp: 98.5 F (36.9 C)  SpO2: 96%   BP Readings from Last 3 Encounters:  05/30/17 (!) 228/110  05/05/17 (!) 150/110  05/01/17 (!) 152/106   Wt Readings from Last 3  Encounters:  05/05/17 142 lb 11.2 oz (64.7 kg)  05/01/17 144 lb (65.3 kg)  10/31/16 139 lb (63 kg)   There is no height or weight on file to calculate BMI.   Physical Exam  Constitutional: She is oriented to person, place, and time. She appears well-developed and well-nourished. No distress.  HENT:  Head: Normocephalic and atraumatic.  Right Ear: External ear normal.  Left Ear: External ear normal.  Nose: Nose normal.  Mouth/Throat: Oropharynx is clear and moist. No oropharyngeal exudate.  Normal B/L ear canals and TM  Eyes: Conjunctivae and EOM are normal.  Neck: Neck supple. No tracheal deviation present. No thyromegaly present.  Cardiovascular: Normal rate, regular rhythm and normal heart sounds.  No murmur heard. Pulmonary/Chest: Effort normal and breath sounds normal. No respiratory distress.  Musculoskeletal: She exhibits no edema.  Lymphadenopathy:    She has no cervical adenopathy.  Neurological: She is alert and oriented to person, place, and time. No cranial nerve deficit. Coordination normal.  Skin: Skin is warm and dry. She is not diaphoretic.  Bruising on both sides of neck  Psychiatric: She has a normal mood and affect. Her behavior is normal.           Assessment & Plan:    See Problem List for Assessment and Plan of chronic medical problems.

## 2017-05-30 ENCOUNTER — Ambulatory Visit (INDEPENDENT_AMBULATORY_CARE_PROVIDER_SITE_OTHER): Payer: Medicare Other | Admitting: Internal Medicine

## 2017-05-30 ENCOUNTER — Encounter: Payer: Self-pay | Admitting: Internal Medicine

## 2017-05-30 VITALS — BP 228/110 | HR 88 | Temp 98.5°F | Resp 16

## 2017-05-30 DIAGNOSIS — I10 Essential (primary) hypertension: Secondary | ICD-10-CM

## 2017-05-30 DIAGNOSIS — R238 Other skin changes: Secondary | ICD-10-CM | POA: Diagnosis not present

## 2017-05-30 DIAGNOSIS — H8113 Benign paroxysmal vertigo, bilateral: Secondary | ICD-10-CM | POA: Diagnosis not present

## 2017-05-30 DIAGNOSIS — R5383 Other fatigue: Secondary | ICD-10-CM | POA: Diagnosis not present

## 2017-05-30 DIAGNOSIS — R233 Spontaneous ecchymoses: Secondary | ICD-10-CM | POA: Insufficient documentation

## 2017-05-30 MED ORDER — NEBIVOLOL HCL 2.5 MG PO TABS: 2.5000 mg | ORAL_TABLET | Freq: Every day | ORAL | 5 refills | Status: DC

## 2017-05-30 NOTE — Assessment & Plan Note (Signed)
She does have white coat htn, but it is very elevated here today - at home 140-150's - which is higher than usual Continue benicar 40 mg daily Start bystolic 2.5 mg daily  - if not tolerated or too expensive can consider CCB ( not amlodipine) Continue to monitor at home

## 2017-05-30 NOTE — Assessment & Plan Note (Signed)
Chronic in nature - worse recently Labs unremarkable in past Consider cardio referral - she deferred today

## 2017-05-30 NOTE — Assessment & Plan Note (Addendum)
Chronic, slighlty worse No nsaids Cbc normal last month   she deferred labs today

## 2017-05-30 NOTE — Patient Instructions (Addendum)
Start claritin daily.   A referral was ordered for PT.   Bystolic was sent to your pharmacy for your blood pressure.     Continue to monitor your BP at home - it should be less than 140/90.     Schedule a follow up.

## 2017-05-30 NOTE — Assessment & Plan Note (Signed)
Improved, but not gone Take zofran or valium prn Will refer to PT Take claritin daily

## 2017-06-13 ENCOUNTER — Other Ambulatory Visit: Payer: Self-pay

## 2017-06-13 ENCOUNTER — Encounter: Payer: Self-pay | Admitting: Physical Therapy

## 2017-06-13 ENCOUNTER — Ambulatory Visit: Payer: Medicare Other | Attending: Internal Medicine | Admitting: Physical Therapy

## 2017-06-13 DIAGNOSIS — R2681 Unsteadiness on feet: Secondary | ICD-10-CM | POA: Insufficient documentation

## 2017-06-13 DIAGNOSIS — H8113 Benign paroxysmal vertigo, bilateral: Secondary | ICD-10-CM | POA: Insufficient documentation

## 2017-06-13 DIAGNOSIS — R2689 Other abnormalities of gait and mobility: Secondary | ICD-10-CM | POA: Diagnosis present

## 2017-06-13 NOTE — Therapy (Signed)
[facility] [address] [place], [place], [address] Phone: [phone]   Fax:  [phone]  Physical Therapy Evaluation  Patient Details  Name: [name] MRN: [id number] Date of Birth: 08-Aug-1945 Referring Provider: [name], MD    Encounter Date: 06/13/2017  PT End of Session - 06/13/17 1613    Visit Number  1    Number of Visits  9 eval plus 2x/week for 4 weeks    Date for PT Re-Evaluation  07/13/17    Authorization Type  [employer] Medicare    Authorization Time Period  06/13/17 to 09/11/2017    PT Start Time  0931    PT Stop Time  1125 pt not billed for time she was resting between sessions    PT Time Calculation (min)  114 min    Activity Tolerance  Treatment limited secondary to medical complications (Comment)    Behavior During Therapy  [facility] for tasks assessed/performed       Past Medical History:  Diagnosis Date  . ALLERGIC RHINITIS   . ANEMIA-NOS   . ASTHMA   . Carpal tunnel syndrome   . COLONIC POLYPS, HX OF   . DIABETES MELLITUS, TYPE II    diet controlled  . GERD   . HYPERLIPIDEMIA   . HYPERTENSION   . OSTEOPENIA   . Psoriasis    severe, began soriatane 01/2012  . Rectal fissure   . Scoliosis     Past Surgical History:  Procedure Laterality Date  . benign rectal growth  2004   removed by Dr. Zella Richer  . BREAST SURGERY  1988   biopsy  . CESAREAN SECTION      There were no vitals filed for this visit.   Subjective Assessment - 06/13/17 0932    Subjective  It wasn't a spinning or swirling, but a rocking on a boat feeling. Now it's a more constant low-level off-balance. It's there all the time. It's worse when I lie down on my left. I also have other things going on--my blood pressure has been up (if I drive by a medical facility it will go up) and they've given me new meds). And my blood glucose has been elevated (CBG 190).     Pertinent History  DM, HTN, osteopenia, psoriasis,  scoliosis    Patient Stated Goals  find out what is going on-- I don't think it's just positional    Currently in Pain?  No/denies         [facility] PT Assessment - 06/13/17 0936      Assessment   Medical Diagnosis  BPPV    Referring Provider  [name], MD     Onset Date/Surgical Date  05/30/17 referral date; began mid-March per pt    Prior Therapy  none for vertigo      Precautions   Precautions  Fall      Restrictions   Weight Bearing Restrictions  No      Balance Screen   Has the patient fallen in the past 6 months  No almost fell when not looking in the park and came up to step    Has the patient had a decrease in activity level because of a fear of falling?   No    Is the patient reluctant to leave their home because of a fear of falling?   No      Home Social worker  Private residence    Living  Arrangements  Alone      Prior Function   Level of Independence  Independent    Leisure  walks everyday      Cognition   Overall Cognitive Status  Within Functional Limits for tasks assessed      Observation/Other Assessments   Focus on Therapeutic Outcomes (FOTO)   accidentally discharged by PT after pt completed           Vestibular Assessment - 06/13/17 0936      Vestibular Assessment   General Observation  pt walks slowly and close to wall as moving from lobby to exam room      Symptom Behavior   Type of Dizziness  Imbalance going to left side or straight supine I feel faint    Frequency of Dizziness  off balance all the time; reaching down or looking up feeling of losing control    Duration of Dizziness  seconds for the bending dizziness; the sense of being off-balance is there all the time (even in sitting)    Aggravating Factors  Looking up to the ceiling;Lying supine;Sitting with head tilted back;Forward bending    Relieving Factors  Head stationary;Slow movements for positional vertigo; nothing helps sense of imbalance       Occulomotor Exam   Occulomotor Alignment  Normal    Spontaneous  Absent    Gaze-induced  Absent    Smooth Pursuits  Intact    Saccades  Intact    Comment  asymptomatic      Vestibulo-Occular Reflex   Comment  unable to assess as pt became too dizzy and BP too elevated for further examination      Positional Testing   Dix-Hallpike  Dix-Hallpike Right;Dix-Hallpike Left    Horizontal Canal Testing  Horizontal Canal Right;Horizontal Canal Left      Dix-Hallpike Right   Dix-Hallpike Right Duration  10    Dix-Hallpike Right Symptoms  Upbeat, right rotatory nystagmus      Dix-Hallpike Left   Dix-Hallpike Left Duration  20    Dix-Hallpike Left Symptoms  Upbeat, left rotatory nystagmus      Horizontal Canal Right   Horizontal Canal Right Duration  none    Horizontal Canal Right Symptoms  Normal      Horizontal Canal Left   Horizontal Canal Left Duration  none    Horizontal Canal Left Symptoms  Normal          Objective measurements completed on examination: See above findings.       Vestibular Treatment/Exercise - 06/13/17 1054      Vestibular Treatment/Exercise   Vestibular Treatment Provided  Canalith Repositioning    Canalith Repositioning  Epley Manuever Left;Epley Manuever Right       EPLEY MANUEVER RIGHT   Number of Reps   1    Overall Response  -- not reassessed; progressed to test Lt Hallpike-Dix    Response Details   very mild, brief vertigo with steps 1 and 3       EPLEY MANUEVER LEFT   Number of Reps   2    Overall Response   Symptoms Worsened s/s worsened after 1st treatment, improved after 2nd     RESPONSE DETAILS LEFT  Pt reported 7/10 dizziness during L Dix-Hallpike with no improvement after treatment. pt's BP at rest after treatment: 203/125 and HR: 67bpm.             PT Education - 06/13/17 1610    Education provided  Yes    Education  Details  instructed not to drive herself home (she called a friend to come get her, but was going to have to  wait 45 minutes and she called friend back and cancelled with plan to drive herself home). Advised to go directly home and take her BP medication, lie down and rest. Patient's friend planned to meet her at her home and "check on her."    Person(s) Educated  Patient    Methods  Explanation    Comprehension  Verbalized understanding          PT Long Term Goals - 06/13/17 1655      PT LONG TERM GOAL #1   Title  Patient will have negative positional testing for BPPV (Target all LTGs 07/13/17)    Baseline  + bil posterior (left worse)    Time  4    Period  Weeks    Status  New      PT LONG TERM GOAL #2   Title  Patient will complete FGA to assess balance and gait with goal to be set as appropriate.    Time  1    Period  Weeks    Status  New      PT LONG TERM GOAL #3   Title  Patient will complete gait velocity assessment and goal to be set as appropriate.     Time  1    Period  Weeks    Status  New      PT LONG TERM GOAL #4   Title  Patient will be independent with HEP for balance and able to continue/resume her walking program (as prior to vertigo/imbalance).     Time  4    Period  Weeks    Status  New             Plan - 06/13/17 1618    Clinical Impression Statement  Patient referred for PT Vestibular evaluation due to onset of sudden dizziness in mid-March. Patient reports she has had vertigo in the past and she does not think this is "just the crystals-thing." Patient was + for bil posterior canalithiasis with left side more symptomatic than right. After first Lt Epley maneuver (rt Epley had been completed already), she reported she felt worse. Nauseated and symptomatic when turning her head left and right. Concerned that pt now had converted to horizontal canal BPPV and pt in no condition to drive herself. Patient allowed to rest in treatment room and sipping on diet caffeine-free soda (she specifically asked for caffeine-free due to her recent elevated BP's). At that  time, a second PT Geoffry Paradise, PT) had an opening in her schedule and went in to check on pt. She checked for bil horizontal canalithiasis with both being negative. She repeated Lt Epley with patient reporting feeling better after second Epley. She was reporting feeling like her BP was high (dizzy, offbalance) and BP assessed at 203/125 and HR 67. Patient again allowed to rest in darkened room with PT educating her on what to expect to feel after Epley maneuver vs what symptoms would be concerning for high BP and potential need to go to the ED. (See pt education section for more detail). Unable to complete Vestibular evaluation due to elevated BP (?also has a hypofunction due to prolonged BPPV vs ongoing/constant symptoms due to elevated BP). Patient agreed to return for follow-up appointment this week for continued vestibular assessment. Anticipate patient can continue to benefit from the PT interventions listed below to address  the deficits listed below.     History and Personal Factors relevant to plan of care:  PMH-DM, HTN, osteopenia, psoriasis, scoliosis;  Personal factors-access to/from home (lives alone and not recommended to drive when experiencing vertigo); ?expected progression as vestibular evaluation incomplete    Clinical Presentation  Unstable    Clinical Presentation due to:  could not complete full vestibular evaluation due to elevated BP and nausea    Clinical Decision Making  High    Rehab Potential  Good    Clinical Impairments Affecting Rehab Potential  elevated BP (>200/100) limited today's participation    PT Frequency  2x / week    PT Duration  4 weeks    PT Treatment/Interventions  ADLs/Self Care Home Management;Canalith Repostioning;Gait training;Stair training;Functional mobility training;Therapeutic activities;Therapeutic exercise;Balance training;Neuromuscular re-education;Patient/family education;Passive range of motion;Vestibular;Visual/perceptual remediation/compensation     PT Next Visit Plan  reassess for Lt posterior BPPV; if still having imbalance issues, ? check for hypofunction; complete FGA, gait velocity and update goals as appropriate; will need to schedule more appts (I set 2x/wk x 4 weeks freq... I didn't get to discuss with her, so I just did 4 weeks...)    Consulted and Agree with Plan of Care  Patient       Patient will benefit from skilled therapeutic intervention in order to improve the following deficits and impairments:  Abnormal gait, Decreased activity tolerance, Decreased balance, Decreased mobility, Dizziness  Visit Diagnosis: BPPV (benign paroxysmal positional vertigo), bilateral - Plan: PT plan of care cert/re-cert  Unsteadiness on feet - Plan: PT plan of care cert/re-cert  Other abnormalities of gait and mobility - Plan: PT plan of care cert/re-cert     Problem List Patient Active Problem List   Diagnosis Date Noted  . Easy bruising 05/30/2017  . Hyperuricemia 05/03/2017  . Arthralgia 05/01/2017  . Hair loss 05/01/2017  . Vitamin D deficiency 10/31/2016  . Redness of skin, feet 10/31/2016  . Diastolic dysfunction 22/63/3354  . BPPV (benign paroxysmal positional vertigo), bilateral 12/24/2015  . Fatigue 12/24/2015  . Nonspecific abnormal electrocardiogram (ECG) (EKG) 12/24/2015  . Bilateral carotid artery disease, Mild 05/31/2015  . Thyroid nodule 05/31/2015  . Psoriasis   . Scoliosis   . Diabetes type 2, controlled ([place]) 06/03/2008  . Dyslipidemia 06/03/2008  . CARPAL TUNNEL SYNDROME 06/03/2008  . Essential hypertension 06/03/2008  . ALLERGIC RHINITIS 06/03/2008  . Asthma 06/03/2008  . GERD 06/03/2008  . Osteopenia 06/03/2008  . COLONIC POLYPS, HX OF 06/03/2008    Rexanne Mano, PT 06/13/2017, 5:19 PM  [facility] [address] [place], [place], [address] Phone: [phone]   Fax:  [phone]  Name: [name] MRN: [id number] Date of  Birth: Jul 30, 1945

## 2017-06-13 NOTE — Progress Notes (Signed)
Subjective:    Patient ID: [name], female    DOB: Nov 27, 1945, 72 y.o.   MRN: [id number]  HPI The patient is here for follow up.  BPPV:  She is taking zofran or valium as needed.  She just started PT yesterday - it did make her vertigo worse.  She is feeling dizziness today.  She has taken some Valium, which she would prefer not to take, but it does help with the vertigo.  She is also had headaches - left posterior head or above eye- relieved with advil.  The dizziness is sometimes related to head movements and sometimes occurs at rest.  She is to more physical therapy sessions scheduled.  Hypertension:  We started bystolic 2.5 mg a couple of weeks ago.  She is taking her medication daily.  She has tried taking the Benicar 40 mg-splitting it in half and taking half in the morning and half in the evening.  She is compliant with a low sodium diet.  She has had a couple episodes of chest pain that was nonexertional and lasted a couple minutes.  She is experiencing the dizziness as above-some of it is not related to head movements.  She has minimal leg edema which she feels is new.  She is still experiencing some shortness of breath when she is exercising and resting seems to improve this.  She also has chronic fatigue.She is exercising regularly-walking.  She does monitor her blood pressure at home - 118/78 - 153/89.  Her heart rate has been on the lower side 58-72 for the most part.  She wondered if it being on the lower side was causing some of her fatigue.      Medications and allergies reviewed with patient and updated if appropriate.  Patient Active Problem List   Diagnosis Date Noted  . Easy bruising 05/30/2017  . Hyperuricemia 05/03/2017  . Arthralgia 05/01/2017  . Hair loss 05/01/2017  . Vitamin D deficiency 10/31/2016  . Redness of skin, feet 10/31/2016  . Diastolic dysfunction 30/94/0768  . BPPV (benign paroxysmal positional vertigo), bilateral 12/24/2015  . Fatigue  12/24/2015  . Nonspecific abnormal electrocardiogram (ECG) (EKG) 12/24/2015  . Bilateral carotid artery disease, Mild 05/31/2015  . Thyroid nodule 05/31/2015  . Psoriasis   . Scoliosis   . Diabetes type 2, controlled ([place]) 06/03/2008  . Dyslipidemia 06/03/2008  . CARPAL TUNNEL SYNDROME 06/03/2008  . Essential hypertension 06/03/2008  . ALLERGIC RHINITIS 06/03/2008  . Asthma 06/03/2008  . GERD 06/03/2008  . Osteopenia 06/03/2008  . COLONIC POLYPS, HX OF 06/03/2008    Current Outpatient Medications on File Prior to Visit  Medication Sig Dispense Refill  . blood glucose meter kit and supplies KIT Dispense based on patient and insurance preference. ONE TOUCH ULTRA 1 each 0  . Cholecalciferol (VITAMIN D3) 2000 units TABS Take by mouth.    . clobetasol (TEMOVATE) 0.05 % external solution Apply 1 application topically at bedtime.     . Coenzyme Q10 (COQ10) 200 MG CAPS Take 1 capsule by mouth daily.    . diazepam (VALIUM) 2 MG tablet Take one every 6 hours as needed for vertigo 30 tablet 0  . doxylamine, Sleep, (UNISOM) 25 MG tablet Take 15 mg by mouth at bedtime as needed for sleep.     Scarlette Shorts SURECLICK 50 MG/ML injection Inject into the skin once a week. Once a week sometimes on sundays or mondays. Patient unsure of dose    . glucose blood (ONE TOUCH  ULTRA TEST) test strip Use test strip to check blood sugar at least 3 times 300 each 1  . halobetasol (ULTRAVATE) 0.05 % cream Apply topically 2 (two) times daily.    Marland Kitchen ibuprofen (ADVIL,MOTRIN) 200 MG tablet Take 200 mg by mouth every 6 (six) hours as needed for mild pain.    . Lancets (ONETOUCH ULTRASOFT) lancets Use lancet to test blood sugar at least twice a day or as directed by your physician. 200 each 11  . loratadine (CLARITIN REDITABS) 10 MG dissolvable tablet Take 10 mg by mouth every other day as needed for allergies.    . Multiple Vitamin (MULTIVITAMIN) tablet Take 1 tablet by mouth daily.      . nebivolol (BYSTOLIC) 2.5 MG tablet  Take 1 tablet (2.5 mg total) by mouth daily. 30 tablet 5  . omeprazole (PRILOSEC) 20 MG capsule Take 20 mg by mouth daily. OTC    . ondansetron (ZOFRAN ODT) 4 MG disintegrating tablet Take 1 tablet (4 mg total) by mouth every 8 (eight) hours as needed for nausea or vomiting. 20 tablet 0  . oxymetazoline (AFRIN 12 HOUR) 0.05 % nasal spray Place 1 spray into the nose daily.      Marland Kitchen ZETIA 10 MG tablet TAKE ONE (1) TABLET BY MOUTH EVERY DAY (Patient taking differently: TAKE ONE (1) TABLET BY MOUTH EVERY OTHER DAY) 90 tablet 1   No current facility-administered medications on file prior to visit.     Past Medical History:  Diagnosis Date  . ALLERGIC RHINITIS   . ANEMIA-NOS   . ASTHMA   . Carpal tunnel syndrome   . COLONIC POLYPS, HX OF   . DIABETES MELLITUS, TYPE II    diet controlled  . GERD   . HYPERLIPIDEMIA   . HYPERTENSION   . OSTEOPENIA   . Psoriasis    severe, began soriatane 01/2012  . Rectal fissure   . Scoliosis     Past Surgical History:  Procedure Laterality Date  . benign rectal growth  2004   removed by Dr. Zella Richer  . BREAST SURGERY  1988   biopsy  . CESAREAN SECTION      Social History   Socioeconomic History  . Marital status: Single    Spouse name: Not on file  . Number of children: Not on file  . Years of education: Not on file  . Highest education level: Not on file  Occupational History  . Not on file  Social Needs  . Financial resource strain: Not on file  . Food insecurity:    Worry: Not on file    Inability: Not on file  . Transportation needs:    Medical: Not on file    Non-medical: Not on file  Tobacco Use  . Smoking status: Former Smoker    Last attempt to quit: 02/14/1974    Years since quitting: 43.3  . Smokeless tobacco: Never Used  Substance and Sexual Activity  . Alcohol use: No  . Drug use: No  . Sexual activity: Not on file  Lifestyle  . Physical activity:    Days per week: Not on file    Minutes per session: Not on file    . Stress: Not on file  Relationships  . Social connections:    Talks on phone: Not on file    Gets together: Not on file    Attends religious service: Not on file    Active member of club or organization: Not on file  Attends meetings of clubs or organizations: Not on file    Relationship status: Not on file  Other Topics Concern  . Not on file  Social History Narrative  . Not on file    Family History  Problem Relation Age of Onset  . Lung cancer Father   . Arthritis Other        Parents  . Asthma Other        parent, other relative  . Breast cancer Other        other relative  . Hypertension Other        parent, other relative  . Heart disease Other        parent, other relative    Review of Systems  Constitutional: Negative for chills and fever.  Respiratory: Positive for shortness of breath. Negative for cough and wheezing.   Cardiovascular: Positive for chest pain (a little a couple of times, went away - lasted a 2 minutes, non exertional) and leg swelling (minimal). Negative for palpitations.  Neurological: Positive for headaches (left side - above eye or posterior head).       Objective:   Vitals:   06/14/17 0755  BP: (!) 158/92  Pulse: 81  Resp: 16  Temp: 98.5 F (36.9 C)  SpO2: 98%   BP Readings from Last 3 Encounters:  06/14/17 (!) 158/92  05/30/17 (!) 228/110  05/05/17 (!) 150/110   Wt Readings from Last 3 Encounters:  06/14/17 142 lb (64.4 kg)  05/05/17 142 lb 11.2 oz (64.7 kg)  05/01/17 144 lb (65.3 kg)   Body mass index is 26.66 kg/m.   Physical Exam    Constitutional: Appears well-developed and well-nourished. No distress.  HENT:  Head: Normocephalic and atraumatic.  Neck: Neck supple. No tracheal deviation present. No thyromegaly present.  No cervical lymphadenopathy Cardiovascular: Normal rate, regular rhythm and normal heart sounds.   No murmur heard. No carotid bruit .  No edema Pulmonary/Chest: Effort normal and breath  sounds normal. No respiratory distress. No has no wheezes. No rales.  Skin: Skin is warm and dry. Not diaphoretic.  Psychiatric: Normal mood and affect. Behavior is normal.      Assessment & Plan:    See Problem List for Assessment and Plan of chronic medical problems.

## 2017-06-14 ENCOUNTER — Telehealth: Payer: Self-pay | Admitting: Internal Medicine

## 2017-06-14 ENCOUNTER — Encounter: Payer: Self-pay | Admitting: Internal Medicine

## 2017-06-14 ENCOUNTER — Ambulatory Visit (INDEPENDENT_AMBULATORY_CARE_PROVIDER_SITE_OTHER): Payer: Medicare Other | Admitting: Internal Medicine

## 2017-06-14 VITALS — BP 158/92 | HR 81 | Temp 98.5°F | Resp 16 | Wt 142.0 lb

## 2017-06-14 DIAGNOSIS — H8113 Benign paroxysmal vertigo, bilateral: Secondary | ICD-10-CM

## 2017-06-14 DIAGNOSIS — I10 Essential (primary) hypertension: Secondary | ICD-10-CM | POA: Diagnosis not present

## 2017-06-14 DIAGNOSIS — R42 Dizziness and giddiness: Secondary | ICD-10-CM

## 2017-06-14 DIAGNOSIS — R0789 Other chest pain: Secondary | ICD-10-CM | POA: Diagnosis not present

## 2017-06-14 DIAGNOSIS — R5383 Other fatigue: Secondary | ICD-10-CM

## 2017-06-14 MED ORDER — OLMESARTAN MEDOXOMIL 20 MG PO TABS: 20.0000 mg | ORAL_TABLET | Freq: Two times a day (BID) | ORAL | 1 refills | Status: DC

## 2017-06-14 NOTE — Assessment & Plan Note (Signed)
Some of her dizziness is related to head movements and is likely BPPV, but she states other dizziness that is not related to head movements and can occur at rest and sometimes feels more like a lightheadedness Will be seen cardiology to make sure there is no cardiac cause Doing physical therapy for probable BPPV

## 2017-06-14 NOTE — Assessment & Plan Note (Signed)
Doing physical therapy Some of her dizziness is related to head movements-continue physical therapy Continue Valium only as needed

## 2017-06-14 NOTE — Patient Instructions (Addendum)
  Medications reviewed and updated.  Changes include change Benicar to the 20 mg twice daily.  Your prescription(s) have been submitted to your pharmacy. Please take as directed and contact our office if you believe you are having problem(s) with the medication(s).  Please followup in 6 months, sooner if needed.  Update me if your dizziness does not improve.  A referral for cardiology - Dr [name] was ordered.

## 2017-06-14 NOTE — Assessment & Plan Note (Signed)
Chronic Labs have been unremarkable Referred to cardiology to rule out cardiac cause given other symptoms

## 2017-06-14 NOTE — Assessment & Plan Note (Signed)
She has had a couple episodes of atypical chest pain-lasts for minutes and resolves She is walking regularly She has been experiencing some shortness of breath with exertion and fatigue Referred to cardiology for further evaluation

## 2017-06-14 NOTE — Assessment & Plan Note (Signed)
Blood pressure overall better controlled with addition of Bystolic, but still variable.  She is concerned that her blood pressure does elevate at times with reaction to anything happens We will try changing Benicar 20 mg twice daily Continue Bystolic 2.5 mg daily Continue regular exercise Continue to monitor blood pressure at home

## 2017-06-14 NOTE — Telephone Encounter (Signed)
Copied from [facility] [id number]. Topic: Quick Communication - See Telephone Encounter >> Jun 14, 2017 12:31 PM Rutherford Nail, NT wrote: CRM for notification. See Telephone encounter for: 06/14/17. Izora Gala from [facility] states that on the patient's olmesartan (BENICAR) 20 MG tablet that insurance will not cover 2 tablets a day. Requesting to change prescription because insurance will only cover one tablet a day. Please advise. Pharmacy would like a call back. CB#: [phone]

## 2017-06-15 ENCOUNTER — Encounter: Payer: Self-pay | Admitting: Physical Therapy

## 2017-06-15 ENCOUNTER — Ambulatory Visit: Payer: Medicare Other | Attending: Internal Medicine | Admitting: Physical Therapy

## 2017-06-15 DIAGNOSIS — R2681 Unsteadiness on feet: Secondary | ICD-10-CM | POA: Diagnosis present

## 2017-06-15 DIAGNOSIS — R2689 Other abnormalities of gait and mobility: Secondary | ICD-10-CM | POA: Insufficient documentation

## 2017-06-15 DIAGNOSIS — H8113 Benign paroxysmal vertigo, bilateral: Secondary | ICD-10-CM | POA: Insufficient documentation

## 2017-06-15 NOTE — Telephone Encounter (Signed)
PA completed. Awaiting response. Key [id number]

## 2017-06-15 NOTE — Therapy (Signed)
[facility] [address] [place], [place], [address] Phone: [phone]   Fax:  [phone]  Physical Therapy Treatment  Patient Details  Name: [name] MRN: [id number] Date of Birth: 11/18/1945 Referring Provider: [name], MD    Encounter Date: 06/15/2017  PT End of Session - 06/15/17 2145    Visit Number  2    Number of Visits  9    Date for PT Re-Evaluation  07/13/17    Authorization Type  UHC Medicare    Authorization Time Period  06/13/17 to 09/11/2017    PT Start Time  1017    PT Stop Time  1101    PT Time Calculation (min)  44 min       Past Medical History:  Diagnosis Date  . ALLERGIC RHINITIS   . ANEMIA-NOS   . ASTHMA   . Carpal tunnel syndrome   . COLONIC POLYPS, HX OF   . DIABETES MELLITUS, TYPE II    diet controlled  . GERD   . HYPERLIPIDEMIA   . HYPERTENSION   . OSTEOPENIA   . Psoriasis    severe, began soriatane 01/2012  . Rectal fissure   . Scoliosis     Past Surgical History:  Procedure Laterality Date  . benign rectal growth  2004   removed by Dr. Zella Richer  . BREAST SURGERY  1988   biopsy  . CESAREAN SECTION      There were no vitals filed for this visit.  Subjective Assessment - 06/15/17 2130    Subjective  Pt states she is better than she was at time of last treatment session (06-13-17) but says she is "still not right" - says some aspects are better but other aspects are different/not improved - pt states "now when I look up to the ceiling (at home) it looks like it is moving back and forth"  Pt states she saw Dr. Quay Burow yesterday and informed her of high BP at session on 06-13-17; pt states Dr. Quay Burow referred her to cardiologist but she is unable to get appt. til 4 months     Pertinent History  DM, HTN, osteopenia, psoriasis, scoliosis    Patient Stated Goals  find out what is going on-- I don't think it's just positional    Currently in Pain?  No/denies                Self Care; discussed symptoms, BP readings which pt had recorded at home; informed pt of symptoms consistent with orthostatic hypotension, but Unable to accurately assess due to pt's high BP readings which occur being in/near MD offices; pt agreed that readings would not be accurate  Rt and Lt Dix-Hallpike tests (-) with no nystagmus in any positions - including Rt and Lt sidelying; pt reported dizziness in Dix-Hallpike positions but denies True room spinning vertigo    Pt instructed in SLS and standing on floor (NOT FOAM) with EC in corner for 15 secs for HEP  Pt amb. 200' near wall at end of session to assist in decreasing dizziness provoked with positional testing/habituation exercises       Vestibular Treatment/Exercise - 06/15/17 0001      Vestibular Treatment/Exercise   Vestibular Treatment Provided  Habituation    Habituation Exercises  Legrand Como Daroff   Number of Reps   3    Symptom Description   no improvement - pt reported dizziness with sidelyng to sitting  position            PT Education - 06/15/17 2138    Education provided  Yes    Education Details  SLS and standing on floor (NOT PILLOW) with EC     Person(s) Educated  Patient    Methods  Explanation;Handout;Demonstration    Comprehension  Verbalized understanding;Returned demonstration          PT Long Term Goals - 06/13/17 1655      PT LONG TERM GOAL #1   Title  Patient will have negative positional testing for BPPV (Target all LTGs 07/13/17)    Baseline  + bil posterior (left worse)    Time  4    Period  Weeks    Status  New      PT LONG TERM GOAL #2   Title  Patient will complete FGA to assess balance and gait with goal to be set as appropriate.    Time  1    Period  Weeks    Status  New      PT LONG TERM GOAL #3   Title  Patient will complete gait velocity assessment and goal to be set as appropriate.     Time  1    Period  Weeks    Status  New       PT LONG TERM GOAL #4   Title  Patient will be independent with HEP for balance and able to continue/resume her walking program (as prior to vertigo/imbalance).     Time  4    Period  Weeks    Status  New            Plan - 06/15/17 2147    Clinical Impression Statement  No nystagmus was observed today with any positional testing; (-) Rt and Lt Dix-Hallpike tests for nystagmus, but pt reported "dizziness" in test position and also with return to upright sitting position.  Symptoms appear to be multi-factorial in etiology - and possibly orthostatic hypotension contributing to symptoms, however, BP not recorded today due to pt's BP increasing signficantly when in/around MD offices.  Pt has list of BP readings recorded at home and BP much lower than readings obtained during session on 06-13-17.  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      Rehab Potential  Good    PT Frequency  2x / week    PT Duration  4 weeks    PT Treatment/Interventions  ADLs/Self Care Home Management;Canalith Repostioning;Gait training;Stair training;Functional mobility training;Therapeutic activities;Therapeutic exercise;Balance training;Neuromuscular re-education;Patient/family education;Passive range of motion;Vestibular;Visual/perceptual remediation/compensation    PT Next Visit Plan  reassess for Lt posterior BPPV; if still having imbalance issues, check HEP     Consulted and Agree with Plan of Care  Patient       Patient will benefit from skilled therapeutic intervention in order to improve the following deficits and impairments:  Abnormal gait, Decreased activity tolerance, Decreased balance, Decreased mobility, Dizziness  Visit Diagnosis: BPPV (benign paroxysmal positional vertigo), bilateral  Unsteadiness on feet     Problem List Patient Active Problem List   Diagnosis Date Noted  . Atypical chest pain 06/14/2017  . Dizziness 06/14/2017  . Easy bruising 05/30/2017  .  Hyperuricemia 05/03/2017  . Arthralgia 05/01/2017  . Hair loss 05/01/2017  . Vitamin D deficiency 10/31/2016  . Redness of skin, feet 10/31/2016  . Diastolic dysfunction 19/14/7829  . BPPV (benign paroxysmal positional vertigo), bilateral 12/24/2015  . Fatigue 12/24/2015  .  Nonspecific abnormal electrocardiogram (ECG) (EKG) 12/24/2015  . Bilateral carotid artery disease, Mild 05/31/2015  . Thyroid nodule 05/31/2015  . Psoriasis   . Scoliosis   . Diabetes type 2, controlled ([place]) 06/03/2008  . Dyslipidemia 06/03/2008  . CARPAL TUNNEL SYNDROME 06/03/2008  . Essential hypertension 06/03/2008  . ALLERGIC RHINITIS 06/03/2008  . Asthma 06/03/2008  . GERD 06/03/2008  . Osteopenia 06/03/2008  . COLONIC POLYPS, HX OF 06/03/2008    Alda Lea, PT 06/15/2017, 10:03 PM  [facility] [address] [place], [place], [address] Phone: [phone]   Fax:  [phone]  Name: [name] MRN: [id number] Date of Birth: 03/31/1945

## 2017-06-15 NOTE — Telephone Encounter (Signed)
PA has been approved. Pharmacy notified.

## 2017-06-15 NOTE — Patient Instructions (Signed)
Feet Apart (Compliant Surface) Varied Arm Positions - Eyes Closed    Stand on compliant surface: __FLOOR _____ with feet shoulder width apart and arms out. Close eyes and visualize upright position. Hold_15___ seconds. Repeat ___2-3_ times per session. Do __2__ sessions per day.  STAND ON FLOOR!!!     SINGLE LIMB STANCE    Stance: single leg on floor. Raise leg. Hold _10__ seconds. Repeat with other leg. _2__ reps per set, __2 sets per day, __5_ days per week  Copyright  VHI. All rights reserved.

## 2017-06-20 ENCOUNTER — Ambulatory Visit: Payer: Medicare Other | Admitting: Physical Therapy

## 2017-06-20 DIAGNOSIS — R2689 Other abnormalities of gait and mobility: Secondary | ICD-10-CM

## 2017-06-20 DIAGNOSIS — H8113 Benign paroxysmal vertigo, bilateral: Secondary | ICD-10-CM | POA: Diagnosis not present

## 2017-06-20 DIAGNOSIS — R2681 Unsteadiness on feet: Secondary | ICD-10-CM

## 2017-06-20 NOTE — Patient Instructions (Addendum)
Tandem Stance    Right foot in front of left, heel touching toe both feet "straight ahead". Stand on Foot Triangle of Support with both feet. Balance in this position _30__ seconds. Do with left foot in front of right.  PARTIAL HEEL TO TOE  Sit to Side-Lying    Sit on edge of bed. 1. Turn head 45 to right. 2. Maintain head position and lie down slowly on left side. Hold until symptoms subside. 3. Sit up slowly. Hold until symptoms subside. 4. Turn head 45 to left. 5. Maintain head position and lie down slowly on right side. Hold until symptoms subside. 6. Sit up slowly. Repeat sequence __5__ times per session. Do _2-3___ sessions per day.  Copyright  VHI. All rights reserved.   Feet Together (Compliant Surface) Varied Arm Positions - Eyes Open    With eyes open, standing on compliant surface: ___pillow_____, feet together and arms out, look at a stationary object. Hold _30__ seconds. Repeat _1-2___ times per session. Do _1___ sessions per day.  ALSO with feet apart - head turns   Feet Apart (Compliant Surface) Head Motion - Eyes Closed    Stand on compliant surface: __pillow______ with feet shoulder width apart. Close eyes and move head slowly, up and down. Repeat __1-2__ times per session. Do _1___ sessions per day.  ALSO - with feet together - head turns

## 2017-06-21 ENCOUNTER — Encounter: Payer: Self-pay | Admitting: Physical Therapy

## 2017-06-21 NOTE — Therapy (Signed)
[facility] [address] [place], [place], [address] Phone: [phone]   Fax:  [phone]  Physical Therapy Treatment  Patient Details  Name: [name] MRN: [id number] Date of Birth: 11-18-1945 Referring Provider: [name], MD    Encounter Date: 06/20/2017  PT End of Session - 06/21/17 1019    Visit Number  3    Number of Visits  9    Date for PT Re-Evaluation  07/13/17    Authorization Type  [employer]HC Medicare    Authorization Time Period  06/13/17 to 09/11/2017    PT Start Time  1150    PT Stop Time  1236    PT Time Calculation (min)  46 min       Past Medical History:  Diagnosis Date  . ALLERGIC RHINITIS   . ANEMIA-NOS   . ASTHMA   . Carpal tunnel syndrome   . COLONIC POLYPS, HX OF   . DIABETES MELLITUS, TYPE II    diet controlled  . GERD   . HYPERLIPIDEMIA   . HYPERTENSION   . OSTEOPENIA   . Psoriasis    severe, began soriatane 01/2012  . Rectal fissure   . Scoliosis     Past Surgical History:  Procedure Laterality Date  . benign rectal growth  2004   removed by Dr. Zella Richer  . BREAST SURGERY  1988   biopsy  . CESAREAN SECTION      There were no vitals filed for this visit.  Subjective Assessment - 06/21/17 1012    Subjective  Pt states she slept on left side on Friday and woke up feeling "floaty"; states that she is doing better overall but that it varies from day to day; states she walked 1 3/4 miles the other day but says this was too much for her; cardiologist appt remains scheduled in 4 months but pt says "my blood pressure is doing great" now     Pertinent History  DM, HTN, osteopenia, psoriasis, scoliosis    Patient Stated Goals  find out what is going on-- I don't think it's just positional    Currently in Pain?  No/denies               Rt and Lt sidelying tests (-) with no nystagmus noted; pt reported feeling "floaty" in Lt sidelying position and reported more   Light-headedness with sidelying to sitting from Lt side than from Rt side  Pt performed x1 viewing exercise in standing - target on plain background 6' away - 60 secs horizontally and 60 secs vertically  Pt reported slightly increased symptoms with vertical head movement than with horizontal    Pt amb. For approx. 2" in exam room to assist with dizziness decreasing in severity           Balance Exercises - 06/21/17 1014      Balance Exercises: Standing   Standing Eyes Opened  Narrow base of support (BOS);Wide (BOA);Head turns;Foam/compliant surface;5 reps    Standing Eyes Closed  Narrow base of support (BOS);Wide (BOA);Head turns;Foam/compliant surface;5 reps    Tandem Stance  Eyes open;Intermittent upper extremity support;1 rep;30 secs on floor - solid surface    SLS  Eyes open;Solid surface;1 rep;Intermittent upper extremity support performed with each leg - approx. 4-6 secs        PT Education - 06/21/17 1018    Education provided  Yes    Education Details  discontinued standing on floor with EC due  to not challenging for pt;  added x1 viewing in standing, habituation for  sit to Lt sidelying, standing on pillow with feet apart/together with EO/EC and head turns ; added tandem stance    Person(s) Educated  Patient    Methods  Explanation;Demonstration;Handout    Comprehension  Verbalized understanding;Returned demonstration          PT Long Term Goals - 06/13/17 1655      PT LONG TERM GOAL #1   Title  Patient will have negative positional testing for BPPV (Target all LTGs 07/13/17)    Baseline  + bil posterior (left worse)    Time  4    Period  Weeks    Status  New      PT LONG TERM GOAL #2   Title  Patient will complete FGA to assess balance and gait with goal to be set as appropriate.    Time  1    Period  Weeks    Status  New      PT LONG TERM GOAL #3   Title  Patient will complete gait velocity assessment and goal to be set as appropriate.     Time  1     Period  Weeks    Status  New      PT LONG TERM GOAL #4   Title  Patient will be independent with HEP for balance and able to continue/resume her walking program (as prior to vertigo/imbalance).     Time  4    Period  Weeks    Status  New            Plan - 06/21/17 1020    Clinical Impression Statement  Pt reports more symptoms "feels more floaty" with Lt sidelying and return to sitting than with Rt sidelying and return to sitting position today; she states that it fluctuates and symptoms provoked are not consistent.  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  No nystagmus noted with Rt or Lt sidelying tests, indicative of no current episode of BPPV at this time.                                                                                                                    Rehab Potential  Good    PT Frequency  2x / week    PT Duration  4 weeks    PT Treatment/Interventions  ADLs/Self Care Home Management;Canalith Repostioning;Gait training;Stair training;Functional mobility training;Therapeutic activities;Therapeutic exercise;Balance training;Neuromuscular re-education;Patient/family education;Passive range of motion;Vestibular;Visual/perceptual remediation/compensation    PT Next Visit Plan  check HEP given on 06-20-17; continue with balance/vestibular exercises - discuss frequency of PT (do you think she needs 2x/week at this time?)     PT Home Exercise Plan  x1 viewing, standing balance on foam, habituation , balance exercises - SLS and tandem    Consulted and Agree with Plan of Care  Patient       Patient will benefit from skilled therapeutic  intervention in order to improve the following deficits and impairments:  Abnormal gait, Decreased activity tolerance, Decreased balance, Decreased mobility, Dizziness  Visit Diagnosis: Unsteadiness on feet  Other abnormalities of gait and mobility  BPPV (benign paroxysmal positional  vertigo), bilateral     Problem List Patient Active Problem List   Diagnosis Date Noted  . Atypical chest pain 06/14/2017  . Dizziness 06/14/2017  . Easy bruising 05/30/2017  . Hyperuricemia 05/03/2017  . Arthralgia 05/01/2017  . Hair loss 05/01/2017  . Vitamin D deficiency 10/31/2016  . Redness of skin, feet 10/31/2016  . Diastolic dysfunction 29/56/2130  . BPPV (benign paroxysmal positional vertigo), bilateral 12/24/2015  . Fatigue 12/24/2015  . Nonspecific abnormal electrocardiogram (ECG) (EKG) 12/24/2015  . Bilateral carotid artery disease, Mild 05/31/2015  . Thyroid nodule 05/31/2015  . Psoriasis   . Scoliosis   . Diabetes type 2, controlled ([place]) 06/03/2008  . Dyslipidemia 06/03/2008  . CARPAL TUNNEL SYNDROME 06/03/2008  . Essential hypertension 06/03/2008  . ALLERGIC RHINITIS 06/03/2008  . Asthma 06/03/2008  . GERD 06/03/2008  . Osteopenia 06/03/2008  . COLONIC POLYPS, HX OF 06/03/2008    Alda Lea, PT 06/21/2017, 10:27 AM  [facility] [address] [place], [place], [address] Phone: [phone]   Fax:  [phone]  Name: [name] MRN: [id number] Date of Birth: 12-20-1945

## 2017-06-23 ENCOUNTER — Encounter: Payer: Self-pay | Admitting: Physical Therapy

## 2017-06-23 ENCOUNTER — Ambulatory Visit: Payer: Medicare Other | Admitting: Physical Therapy

## 2017-06-23 ENCOUNTER — Ambulatory Visit: Payer: Medicare Other | Admitting: Internal Medicine

## 2017-06-23 DIAGNOSIS — H8113 Benign paroxysmal vertigo, bilateral: Secondary | ICD-10-CM | POA: Diagnosis not present

## 2017-06-23 DIAGNOSIS — R2681 Unsteadiness on feet: Secondary | ICD-10-CM

## 2017-06-23 NOTE — Therapy (Signed)
[facility] [address] [place], [place], [address] Phone: [phone]   Fax:  [phone]  Physical Therapy Treatment  Patient Details  Name: [name] MRN: [id number] Date of Birth: [birth year] Referring Provider: [name], MD    Encounter Date: 06/23/2017  PT End of Session - 06/23/17 0950    Visit Number  4    Number of Visits  9    Date for PT Re-Evaluation  07/13/17    Authorization Type  UHC Medicare    Authorization Time Period  06/13/17 to 09/11/2017    PT Start Time  0850    PT Stop Time  0935    PT Time Calculation (min)  45 min    Activity Tolerance  Patient tolerated treatment well    Behavior During Therapy  [facility] for tasks assessed/performed;Anxious       Past Medical History:  Diagnosis Date  . ALLERGIC RHINITIS   . ANEMIA-NOS   . ASTHMA   . Carpal tunnel syndrome   . COLONIC POLYPS, HX OF   . DIABETES MELLITUS, TYPE II    diet controlled  . GERD   . HYPERLIPIDEMIA   . HYPERTENSION   . OSTEOPENIA   . Psoriasis    severe, began soriatane 01/2012  . Rectal fissure   . Scoliosis     Past Surgical History:  Procedure Laterality Date  . benign rectal growth  2004   removed by Dr. Zella Richer  . BREAST SURGERY  1988   biopsy  . CESAREAN SECTION      There were no vitals filed for this visit.  Subjective Assessment - 06/23/17 0852    Subjective  Tues after I'd been here, eaten lunch, walked a mile, and then as sitting still I had a terrible wave of "vertigo" (walls crumple in and get wavy) lasted for a few minutes (not sure, but that's what it felt like), but the imbalance lasted for 24 hours. Since then I did my exercises the minimum, I had hoped to really hit them hard but didn't after that bad episode. I have not done the lying side to side exercise at all because it makes me feel bad and I don't feel safe doing them alone. Several years ago I had a lot of headaches and then  ultimately went away and my MRI was normal.  They were never diagnosed as migraines.     Pertinent History  DM, HTN, osteopenia, psoriasis, scoliosis    Patient Stated Goals  find out what is going on-- I don't think it's just positional    Currently in Pain?  No/denies             Vestibular Assessment - 06/23/17 0924      Dix-Hallpike Right   Dix-Hallpike Right Duration  15    Dix-Hallpike Right Symptoms  Upbeat Nystagmus      Dix-Hallpike Left   Dix-Hallpike Left Duration  0    Dix-Hallpike Left Symptoms  No nystagmus       Initially completed straight head hanging test to rule out anterior canal BPPV and pt with no symptoms.            Vestibular Treatment/Exercise - 06/23/17 0926      Vestibular Treatment/Exercise   Vestibular Treatment Provided  Canalith Repositioning    Canalith Repositioning  Epley Manuever Right       EPLEY MANUEVER RIGHT   Number of Reps   1  Response Details   + sense of environment moving with each step for several seconds (each with several seconds of latency)            PT Education - 06/23/17 0948    Education Details  rationale for Brandt-Daroff exercises (ultimately agreed for her to not do them as they make her nervous/uncomfortable to do when alone); continue balance exercises; unexplained episode that began when sitting still    Person(s) Educated  Patient    Methods  Explanation    Comprehension  Verbalized understanding          PT Long Term Goals - 06/13/17 1655      PT LONG TERM GOAL #1   Title  Patient will have negative positional testing for BPPV (Target all LTGs 07/13/17)    Baseline  + bil posterior (left worse)    Time  4    Period  Weeks    Status  New      PT LONG TERM GOAL #2   Title  Patient will complete FGA to assess balance and gait with goal to be set as appropriate.    Time  1    Period  Weeks    Status  New      PT LONG TERM GOAL #3   Title  Patient will complete gait velocity  assessment and goal to be set as appropriate.     Time  1    Period  Weeks    Status  New      PT LONG TERM GOAL #4   Title  Patient will be independent with HEP for balance and able to continue/resume her walking program (as prior to vertigo/imbalance).     Time  4    Period  Weeks    Status  New            Plan - 06/23/17 3545    Clinical Impression Statement  Patient reported 2 separate episodes of vertigo (she does not spin, but walls get wavey/crumple in) since last seen. Last night going to lie down on her right side had several seconds. Assessed for BPPV and again +rt Hallpike-Dix and treated with Epley x1. The other episode she described was not characteristic of inner ear or BP cause as she was sitting still watching TV and +vertigo that lasted several minutes. The imbalance feeling afterwards lasted 24 hours. Patient feels strongly she needs another MRI of her brain (last done 2017) and sees her PCP in ~10 days. Reviewed prior HEP provided and agreed to stop doing Brandt-Daroff (see education). Will continue to address balance and vestibular issues as appropriate. Will send note to Dr. Quay Burow re: recent episode that did not seem peripheral in nature.     Rehab Potential  Good    PT Frequency  2x / week    PT Duration  4 weeks    PT Treatment/Interventions  ADLs/Self Care Home Management;Canalith Repostioning;Gait training;Stair training;Functional mobility training;Therapeutic activities;Therapeutic exercise;Balance training;Neuromuscular re-education;Patient/family education;Passive range of motion;Vestibular;Visual/perceptual remediation/compensation    PT Next Visit Plan  check recent symptoms and if need to reassess for Rt posterior BPPV; check tolerance for VORx1; continue with balance/vestibular exercises - discuss frequency of PT (do you think she needs 2x/week at this time?)     PT Home Exercise Plan  x1 viewing, standing balance on foam, habituation , balance exercises -  SLS and tandem    Consulted and Agree with Plan of Care  Patient  Patient will benefit from skilled therapeutic intervention in order to improve the following deficits and impairments:  Abnormal gait, Decreased activity tolerance, Decreased balance, Decreased mobility, Dizziness  Visit Diagnosis: Unsteadiness on feet  BPPV (benign paroxysmal positional vertigo), bilateral     Problem List Patient Active Problem List   Diagnosis Date Noted  . Atypical chest pain 06/14/2017  . Dizziness 06/14/2017  . Easy bruising 05/30/2017  . Hyperuricemia 05/03/2017  . Arthralgia 05/01/2017  . Hair loss 05/01/2017  . Vitamin D deficiency 10/31/2016  . Redness of skin, feet 10/31/2016  . Diastolic dysfunction 45/36/4680  . BPPV (benign paroxysmal positional vertigo), bilateral 12/24/2015  . Fatigue 12/24/2015  . Nonspecific abnormal electrocardiogram (ECG) (EKG) 12/24/2015  . Bilateral carotid artery disease, Mild 05/31/2015  . Thyroid nodule 05/31/2015  . Psoriasis   . Scoliosis   . Diabetes type 2, controlled ([place]) 06/03/2008  . Dyslipidemia 06/03/2008  . CARPAL TUNNEL SYNDROME 06/03/2008  . Essential hypertension 06/03/2008  . ALLERGIC RHINITIS 06/03/2008  . Asthma 06/03/2008  . GERD 06/03/2008  . Osteopenia 06/03/2008  . COLONIC POLYPS, HX OF 06/03/2008    Rexanne Mano, PT 06/23/2017, 9:59 AM  [facility] [address] [place], [place], [address] Phone: [phone]   Fax:  [phone]  Name: RAKEL JUNIO MRN: [id number] Date of Birth: 1945/08/04

## 2017-06-29 ENCOUNTER — Encounter: Payer: Self-pay | Admitting: Physical Therapy

## 2017-06-29 ENCOUNTER — Ambulatory Visit: Payer: Medicare Other | Admitting: Physical Therapy

## 2017-06-29 DIAGNOSIS — H8113 Benign paroxysmal vertigo, bilateral: Secondary | ICD-10-CM | POA: Diagnosis not present

## 2017-06-29 DIAGNOSIS — R2681 Unsteadiness on feet: Secondary | ICD-10-CM

## 2017-06-29 DIAGNOSIS — R2689 Other abnormalities of gait and mobility: Secondary | ICD-10-CM

## 2017-06-29 NOTE — Therapy (Signed)
[facility] [address] [place], [place], [address] Phone: [phone]   Fax:  [phone]  Physical Therapy Treatment  Patient Details  Name: [name] MRN: [id number] Date of Birth: [birth year] Referring Provider: [name], MD    Encounter Date: 06/29/2017  PT End of Session - 06/29/17 1723    Visit Number  5    Number of Visits  9    Date for PT Re-Evaluation  07/13/17    Authorization Type  [employer] Medicare    Authorization Time Period  06/13/17 to 09/11/2017    PT Start Time  0804    PT Stop Time  0847    PT Time Calculation (min)  43 min    Activity Tolerance  Patient tolerated treatment well    Behavior During Therapy  [facility] for tasks assessed/performed;Anxious       Past Medical History:  Diagnosis Date  . ALLERGIC RHINITIS   . ANEMIA-NOS   . ASTHMA   . Carpal tunnel syndrome   . COLONIC POLYPS, HX OF   . DIABETES MELLITUS, TYPE II    diet controlled  . GERD   . HYPERLIPIDEMIA   . HYPERTENSION   . OSTEOPENIA   . Psoriasis    severe, began soriatane 01/2012  . Rectal fissure   . Scoliosis     Past Surgical History:  Procedure Laterality Date  . benign rectal growth  2004   removed by Dr. Zella Richer  . BREAST SURGERY  1988   biopsy  . CESAREAN SECTION      There were no vitals filed for this visit.  Subjective Assessment - 06/29/17 0806    Subjective  I didn't have any episodes at all since last week until last night. When I laid down (to my right) I had just a brief (seconds) episode feeling of rocking and that I was going to pass out. Did not have the underlying constant feeling of imbalance. Balance was better. Doesn't want to try [place] today since she has overall been doing well. Did not sleep well last night.     Pertinent History  DM, HTN, osteopenia, psoriasis, scoliosis    Patient Stated Goals  find out what is going on-- I don't think it's just positional    Currently in  Pain?  No/denies         [facility] PT Assessment - 06/29/17 0831      Functional Gait  Assessment   Gait assessed   Yes    Gait Level Surface  Walks 20 ft in less than 5.5 sec, no assistive devices, good speed, no evidence for imbalance, normal gait pattern, deviates no more than 6 in outside of the 12 in walkway width.    Change in Gait Speed  Able to smoothly change walking speed without loss of balance or gait deviation. Deviate no more than 6 in outside of the 12 in walkway width.    Gait with Horizontal Head Turns  Performs head turns smoothly with slight change in gait velocity (eg, minor disruption to smooth gait path), deviates 6-10 in outside 12 in walkway width, or uses an assistive device.    Gait with Vertical Head Turns  Performs task with slight change in gait velocity (eg, minor disruption to smooth gait path), deviates 6 - 10 in outside 12 in walkway width or uses assistive device    Gait and Pivot Turn  Pivot turns safely within 3 sec and stops quickly with  no loss of balance.    Step Over Obstacle  Is able to step over 2 stacked shoe boxes taped together (9 in total height) without changing gait speed. No evidence of imbalance.    Gait with Narrow Base of Support  Is able to ambulate for 10 steps heel to toe with no staggering.    Gait with Eyes Closed  Walks 20 ft, uses assistive device, slower speed, mild gait deviations, deviates 6-10 in outside 12 in walkway width. Ambulates 20 ft in less than 9 sec but greater than 7 sec.    Ambulating Backwards  Walks 20 ft, slow speed, abnormal gait pattern, evidence for imbalance, deviates 10-15 in outside 12 in walkway width.    Steps  Alternating feet, must use rail.    Total Score  24                   [facility] Adult PT Treatment/Exercise - 06/29/17 0831      Ambulation/Gait   Ambulation/Gait  Yes    Ambulation/Gait Assistance  7: Independent    Ambulation Distance (Feet)  75 Feet 120, 100    Assistive device  None     Gait Pattern  Step-through pattern;Wide base of support;Decreased trunk rotation    Gait velocity  32.8/10.04=3.27 normal; 32.8/7.72=4.25   norm for age 22.79 ft/sec    Stairs  Yes    Stairs Assistance  6: Modified independent (Device/Increase time)    Stair Management Technique  One rail Right;Alternating pattern;Forwards    Number of Stairs  4    Height of Stairs  6             PT Education - 06/29/17 1724    Education Details  discussed original POC ends 5/30; due to high pt census and difficulty getting appts, she wanted to schedule into June however explained anticipate she will not need an extension    Person(s) Educated  Patient    Methods  Explanation    Comprehension  Verbalized understanding;Need further instruction          PT Long Term Goals - 06/13/17 1655      PT LONG TERM GOAL #1   Title  Patient will have negative positional testing for BPPV (Target all LTGs 07/13/17)    Baseline  + bil posterior (left worse)    Time  4    Period  Weeks    Status  New      PT LONG TERM GOAL #2   Title  Patient will complete FGA to assess balance and gait with goal to be set as appropriate.    Time  1    Period  Weeks    Status  New      PT LONG TERM GOAL #3   Title  Patient will complete gait velocity assessment and goal to be set as appropriate.     Time  1    Period  Weeks    Status  New      PT LONG TERM GOAL #4   Title  Patient will be independent with HEP for balance and able to continue/resume her walking program (as prior to vertigo/imbalance).     Time  4    Period  Weeks    Status  New            Plan - 06/29/17 1726    Clinical Impression Statement  Overall pt much improved. Most of the week she did not have vertigo  or underlying sense of imbalance, however it did return last night. BPPV assessment deferred at pt's request ("it was so mild, let's just wait and see.") Further balance assessment completed with pt scoring 24/30 on FGA (moderate fall  risk category). She had episodes of imbalance during assessment with head turns and her velocity was below normal for test standards. Patient can continue to benefit from PT to work towards her goals.     Rehab Potential  Good    PT Frequency  2x / week    PT Duration  4 weeks    PT Treatment/Interventions  ADLs/Self Care Home Management;Canalith Repostioning;Gait training;Stair training;Functional mobility training;Therapeutic activities;Therapeutic exercise;Balance training;Neuromuscular re-education;Patient/family education;Passive range of motion;Vestibular;Visual/perceptual remediation/compensation    PT Next Visit Plan  check recent symptoms and if need to reassess for Rt posterior BPPV; continue with balance/vestibular exercises (add to HEP for balance)     PT Home Exercise Plan  x1 viewing, standing balance on foam, habituation , balance exercises - SLS and tandem    Consulted and Agree with Plan of Care  Patient       Patient will benefit from skilled therapeutic intervention in order to improve the following deficits and impairments:  Abnormal gait, Decreased activity tolerance, Decreased balance, Decreased mobility, Dizziness  Visit Diagnosis: Unsteadiness on feet  Other abnormalities of gait and mobility     Problem List Patient Active Problem List   Diagnosis Date Noted  . Atypical chest pain 06/14/2017  . Dizziness 06/14/2017  . Easy bruising 05/30/2017  . Hyperuricemia 05/03/2017  . Arthralgia 05/01/2017  . Hair loss 05/01/2017  . Vitamin D deficiency 10/31/2016  . Redness of skin, feet 10/31/2016  . Diastolic dysfunction 34/19/6222  . BPPV (benign paroxysmal positional vertigo), bilateral 12/24/2015  . Fatigue 12/24/2015  . Nonspecific abnormal electrocardiogram (ECG) (EKG) 12/24/2015  . Bilateral carotid artery disease, Mild 05/31/2015  . Thyroid nodule 05/31/2015  . Psoriasis   . Scoliosis   . Diabetes type 2, controlled ([place]) 06/03/2008  . Dyslipidemia  06/03/2008  . CARPAL TUNNEL SYNDROME 06/03/2008  . Essential hypertension 06/03/2008  . ALLERGIC RHINITIS 06/03/2008  . Asthma 06/03/2008  . GERD 06/03/2008  . Osteopenia 06/03/2008  . COLONIC POLYPS, HX OF 06/03/2008    Rexanne Mano, PT 06/29/2017, 5:32 PM  [facility] [address] [place], [place], [address] Phone: [phone]   Fax:  [phone]  Name: [name] MRN: [id number] Date of Birth: [birth year]

## 2017-07-01 NOTE — Progress Notes (Signed)
Subjective:    Patient ID: [name], female    DOB: 27-Nov-1945, 72 y.o.   MRN: [id number]  HPI The patient is here for follow up.   Concern over fatigue, low HR, BP  Hypertension: She is taking her medication daily - Bystolic 2.5 mg daily, Benicar 20 mg twice daily. She has had a little edema in her feet, which she feels may be related to the Bystolic.  It is very mild.  She is compliant with a low sodium diet.  She still experiences some tightness in her chest that she feels may be related to atypical GERD.  She denies  palpitations, shortness of breath and regular headaches. She is exercising regularly - walking.  She does monitor her blood pressure at home - 120-130-overall it has been well controlled..    Atypical chest pain, SOB with exertion, Fatigue:  She was referred to cardiology at her last visit early this month.  She has an appointment in August.  She still feels tightness in her chest on occasion, but she wonders if this is GERD.  She does have GERD, but as long as she takes her medication on a daily basis it typically is well controlled.  This has not worsened.  She continues to experience shortness of breath with exertion, but typically this is only with inclines and with warmer weather.  Vertigo - BPPV:  Her vertigo is sometimes related to head movements and sometimes not.  She has zofran and valium that she can take as needed-she does take this only as needed, but tries to avoid it.  She is doing PT. she has had 2 episodes of vertigo that did not seem related to head movements-this occurred when she was sitting still and it lasted less than 1 minutes.  She was concerned about these episodes.  Physical therapy stated this did not sound like benign positional vertigo.  She was worried about the possibility of having a TIA with these events.  Medications and allergies reviewed with patient and updated if appropriate.  Patient Active Problem List   Diagnosis Date Noted  .  Atypical chest pain 06/14/2017  . Dizziness 06/14/2017  . Easy bruising 05/30/2017  . Hyperuricemia 05/03/2017  . Arthralgia 05/01/2017  . Hair loss 05/01/2017  . Vitamin D deficiency 10/31/2016  . Redness of skin, feet 10/31/2016  . Diastolic dysfunction 37/05/8887  . Vertigo 12/24/2015  . Fatigue 12/24/2015  . Nonspecific abnormal electrocardiogram (ECG) (EKG) 12/24/2015  . Bilateral carotid artery disease, Mild 05/31/2015  . Thyroid nodule 05/31/2015  . Psoriasis   . Scoliosis   . Diabetes type 2, controlled ([place]) 06/03/2008  . Dyslipidemia 06/03/2008  . CARPAL TUNNEL SYNDROME 06/03/2008  . Essential hypertension 06/03/2008  . ALLERGIC RHINITIS 06/03/2008  . Asthma 06/03/2008  . GERD 06/03/2008  . Osteopenia 06/03/2008  . COLONIC POLYPS, HX OF 06/03/2008    Current Outpatient Medications on File Prior to Visit  Medication Sig Dispense Refill  . blood glucose meter kit and supplies KIT Dispense based on patient and insurance preference. ONE TOUCH ULTRA 1 each 0  . Cholecalciferol (VITAMIN D3) 2000 units TABS Take by mouth.    . clobetasol (TEMOVATE) 0.05 % external solution Apply 1 application topically at bedtime.     . Coenzyme Q10 (COQ10) 200 MG CAPS Take 1 capsule by mouth daily.    . diazepam (VALIUM) 2 MG tablet Take one every 6 hours as needed for vertigo 30 tablet 0  . doxylamine, Sleep, (  UNISOM) 25 MG tablet Take 15 mg by mouth at bedtime as needed for sleep.     Scarlette Shorts SURECLICK 50 MG/ML injection Inject into the skin once a week. Once a week sometimes on sundays or mondays. Patient unsure of dose    . glucose blood (ONE TOUCH ULTRA TEST) test strip Use test strip to check blood sugar at least 3 times 300 each 1  . halobetasol (ULTRAVATE) 0.05 % cream Apply topically 2 (two) times daily.    Marland Kitchen ibuprofen (ADVIL,MOTRIN) 200 MG tablet Take 200 mg by mouth every 6 (six) hours as needed for mild pain.    . Lancets (ONETOUCH ULTRASOFT) lancets Use lancet to test blood  sugar at least twice a day or as directed by your physician. 200 each 11  . loratadine (CLARITIN REDITABS) 10 MG dissolvable tablet Take 10 mg by mouth every other day as needed for allergies.    . Multiple Vitamin (MULTIVITAMIN) tablet Take 1 tablet by mouth daily.      . nebivolol (BYSTOLIC) 2.5 MG tablet Take 1 tablet (2.5 mg total) by mouth daily. 30 tablet 5  . olmesartan (BENICAR) 20 MG tablet Take 1 tablet (20 mg total) by mouth 2 (two) times daily. 180 tablet 1  . omeprazole (PRILOSEC) 20 MG capsule Take 20 mg by mouth daily. OTC    . ondansetron (ZOFRAN ODT) 4 MG disintegrating tablet Take 1 tablet (4 mg total) by mouth every 8 (eight) hours as needed for nausea or vomiting. 20 tablet 0  . oxymetazoline (AFRIN 12 HOUR) 0.05 % nasal spray Place 1 spray into the nose daily.      Marland Kitchen ZETIA 10 MG tablet TAKE ONE (1) TABLET BY MOUTH EVERY DAY (Patient taking differently: TAKE ONE (1) TABLET BY MOUTH EVERY OTHER DAY) 90 tablet 1   No current facility-administered medications on file prior to visit.     Past Medical History:  Diagnosis Date  . ALLERGIC RHINITIS   . ANEMIA-NOS   . ASTHMA   . Carpal tunnel syndrome   . COLONIC POLYPS, HX OF   . DIABETES MELLITUS, TYPE II    diet controlled  . GERD   . HYPERLIPIDEMIA   . HYPERTENSION   . OSTEOPENIA   . Psoriasis    severe, began soriatane 01/2012  . Rectal fissure   . Scoliosis     Past Surgical History:  Procedure Laterality Date  . benign rectal growth  2004   removed by Dr. Zella Richer  . BREAST SURGERY  1988   biopsy  . CESAREAN SECTION      Social History   Socioeconomic History  . Marital status: Single    Spouse name: Not on file  . Number of children: Not on file  . Years of education: Not on file  . Highest education level: Not on file  Occupational History  . Not on file  Social Needs  . Financial resource strain: Not on file  . Food insecurity:    Worry: Not on file    Inability: Not on file  .  Transportation needs:    Medical: Not on file    Non-medical: Not on file  Tobacco Use  . Smoking status: Former Smoker    Last attempt to quit: 02/14/1974    Years since quitting: 43.4  . Smokeless tobacco: Never Used  Substance and Sexual Activity  . Alcohol use: No  . Drug use: No  . Sexual activity: Not on file  Lifestyle  .  Physical activity:    Days per week: Not on file    Minutes per session: Not on file  . Stress: Not on file  Relationships  . Social connections:    Talks on phone: Not on file    Gets together: Not on file    Attends religious service: Not on file    Active member of club or organization: Not on file    Attends meetings of clubs or organizations: Not on file    Relationship status: Not on file  Other Topics Concern  . Not on file  Social History Narrative  . Not on file    Family History  Problem Relation Age of Onset  . Lung cancer Father   . Arthritis Other        Parents  . Asthma Other        parent, other relative  . Breast cancer Other        other relative  . Hypertension Other        parent, other relative  . Heart disease Other        parent, other relative    Review of Systems  Constitutional: Positive for fatigue (? related to not sleeping well). Negative for chills and fever.  Respiratory: Positive for shortness of breath (intermittent with exertion - occurs with inclines or very warm out). Negative for cough and wheezing.   Cardiovascular: Positive for chest pain (occ tightness in chest - she feels it is gastric) and leg swelling (mild foot swelling). Negative for palpitations.  Neurological: Positive for dizziness and headaches (occasional).       Objective:   Vitals:   07/03/17 0750  BP: (!) 154/110  Pulse: 81  Resp: 16  Temp: 98.5 F (36.9 C)  SpO2: 98%   BP Readings from Last 3 Encounters:  07/03/17 (!) 154/110  06/14/17 (!) 158/92  05/30/17 (!) 228/110   Wt Readings from Last 3 Encounters:  07/03/17 143  lb (64.9 kg)  06/14/17 142 lb (64.4 kg)  05/05/17 142 lb 11.2 oz (64.7 kg)   Body mass index is 26.84 kg/m.   Physical Exam    Constitutional: Appears well-developed and well-nourished. No distress.  HENT:  Head: Normocephalic and atraumatic.  Neck: Neck supple. No tracheal deviation present. No thyromegaly present.  No cervical lymphadenopathy Cardiovascular: Normal rate, regular rhythm and normal heart sounds.   No murmur heard. No carotid bruit .  No edema Pulmonary/Chest: Effort normal and breath sounds normal. No respiratory distress. No has no wheezes. No rales.  Skin: Skin is warm and dry. Not diaphoretic.  Psychiatric: Normal mood and affect. Behavior is normal.      Assessment & Plan:    See Problem List for Assessment and Plan of chronic medical problems.

## 2017-07-03 ENCOUNTER — Encounter: Payer: Self-pay | Admitting: Internal Medicine

## 2017-07-03 ENCOUNTER — Ambulatory Visit (INDEPENDENT_AMBULATORY_CARE_PROVIDER_SITE_OTHER): Payer: Medicare Other | Admitting: Internal Medicine

## 2017-07-03 VITALS — BP 154/110 | HR 81 | Temp 98.5°F | Resp 16 | Wt 143.0 lb

## 2017-07-03 DIAGNOSIS — I10 Essential (primary) hypertension: Secondary | ICD-10-CM | POA: Diagnosis not present

## 2017-07-03 DIAGNOSIS — R42 Dizziness and giddiness: Secondary | ICD-10-CM | POA: Diagnosis not present

## 2017-07-03 DIAGNOSIS — H81399 Other peripheral vertigo, unspecified ear: Secondary | ICD-10-CM | POA: Diagnosis not present

## 2017-07-03 DIAGNOSIS — R0789 Other chest pain: Secondary | ICD-10-CM | POA: Diagnosis not present

## 2017-07-03 NOTE — Patient Instructions (Addendum)
  Medications reviewed and updated.  No changes recommended at this time.    A referral was ordered for additional PT.    An MRI/ MRA of your head was ordered.    Please followup in 3 months

## 2017-07-03 NOTE — Assessment & Plan Note (Signed)
She continues to have intermittent tightness in her chest that is not associated with activity She feels this may be related to atypical GERD-she feels her GERD is controlled as long she takes her medication on a daily basis She is continuing to exercise No escalation chest pain Has an appointment with cardiology this summer-if her chest pain increases or changes she will let me know, but at this point I feel she is low risk and okay to wait until August to see cardiology

## 2017-07-03 NOTE — Assessment & Plan Note (Addendum)
Doing PT - some improvement in BPPV Now having vertigo that occurs when just sitting w/o movement - lasts < 1 min, but unsteady for a couple of hours Given change of vertigo will do imaging-MRI/MRA of brain Depending on results in the vertigo improves or does not improve we will consider neurology referral Continue Zofran and Valium as needed Continue physical therapy

## 2017-07-03 NOTE — Assessment & Plan Note (Signed)
Blood pressure elevated here today, but overall has been well controlled at home She has mild foot swelling and feels this may be related to the diastolic-we will continue versus trying a different medication that she may have other side effects to Continue current medications at current doses She will continue to monitor at home

## 2017-07-04 ENCOUNTER — Ambulatory Visit: Payer: Medicare Other | Admitting: Physical Therapy

## 2017-07-04 DIAGNOSIS — H8113 Benign paroxysmal vertigo, bilateral: Secondary | ICD-10-CM | POA: Diagnosis not present

## 2017-07-04 DIAGNOSIS — R2681 Unsteadiness on feet: Secondary | ICD-10-CM

## 2017-07-05 ENCOUNTER — Encounter: Payer: Self-pay | Admitting: Physical Therapy

## 2017-07-05 NOTE — Therapy (Signed)
[facility] [address] [place], [place], [address] Phone: [phone]   Fax:  [phone]  Physical Therapy Treatment  Patient Details  Name: [name] MRN: [id number] Date of Birth: 08/12/1945 Referring Provider: [name], MD    Encounter Date: 07/04/2017  PT End of Session - 07/05/17 1824    Visit Number  6    Number of Visits  9    Date for PT Re-Evaluation  07/13/17    Authorization Type  [employer] Medicare    Authorization Time Period  06/13/17 to 09/11/2017    PT Start Time  1150    PT Stop Time  1235    PT Time Calculation (min)  45 min       Past Medical History:  Diagnosis Date  . ALLERGIC RHINITIS   . ANEMIA-NOS   . ASTHMA   . Carpal tunnel syndrome   . COLONIC POLYPS, HX OF   . DIABETES MELLITUS, TYPE II    diet controlled  . GERD   . HYPERLIPIDEMIA   . HYPERTENSION   . OSTEOPENIA   . Psoriasis    severe, began soriatane 01/2012  . Rectal fissure   . Scoliosis     Past Surgical History:  Procedure Laterality Date  . benign rectal growth  2004   removed by Dr. Zella Richer  . BREAST SURGERY  1988   biopsy  . CESAREAN SECTION      There were no vitals filed for this visit.  Subjective Assessment - 07/05/17 1815    Subjective  Episode of dysequilibirum/disorientation/light headedness on Saturday, 5-18 that lasted for less than a minute; but then the unsteadiness lasted for a couple of hours; she had to walk for about 15" to get it to settle    Pertinent History  DM, HTN, osteopenia, psoriasis, scoliosis    Patient Stated Goals  find out what is going on-- I don't think it's just positional    Currently in Pain?  No/denies                       [facility] Adult PT Treatment/Exercise - 07/05/17 0001      Ambulation/Gait   Ambulation/Gait  Yes    Ambulation/Gait Assistance  5: Supervision    Ambulation Distance (Feet)  400 Feet    Assistive device  None    Gait  Pattern  Step-through pattern    Gait Comments  pt amb. making circles clockwise and counterclockwise with ball to improve gaze stabilization; amb. with quick turn and stop x 4 reps           Balance Exercises - 07/05/17 1817      Balance Exercises: Standing   Standing Eyes Opened  Narrow base of support (BOS);Wide (BOA);Head turns;Foam/compliant surface;5 reps    Standing Eyes Closed  Narrow base of support (BOS);Wide (BOA);Head turns;Foam/compliant surface;5 reps    Tandem Stance  Eyes open;2 reps;15 secs;Intermittent upper extremity support    SLS  Eyes open;2 reps;10 secs    Rockerboard  Anterior/posterior;Head turns;EO;EC;10 reps    Gait with Head Turns  Forward;2 reps    Other Standing Exercises  marching on foam with EO/EC and with head turns with CGA              PT Long Term Goals - 06/13/17 1655      PT LONG TERM GOAL #1   Title  Patient will have negative positional testing for BPPV (  Target all LTGs 07/13/17)    Baseline  + bil posterior (left worse)    Time  4    Period  Weeks    Status  New      PT LONG TERM GOAL #2   Title  Patient will complete FGA to assess balance and gait with goal to be set as appropriate.    Time  1    Period  Weeks    Status  New      PT LONG TERM GOAL #3   Title  Patient will complete gait velocity assessment and goal to be set as appropriate.     Time  1    Period  Weeks    Status  New      PT LONG TERM GOAL #4   Title  Patient will be independent with HEP for balance and able to continue/resume her walking program (as prior to vertigo/imbalance).     Time  4    Period  Weeks    Status  New            Plan - 07/05/17 1825    Clinical Impression Statement  Pt progressing very well with dynamic standing balance on compliant surfaces - only mild LOB noted with activities with EC with pt able to recover with use of wall as needed.      PT Frequency  2x / week    PT Duration  4 weeks    PT Treatment/Interventions   ADLs/Self Care Home Management;Canalith Repostioning;Gait training;Stair training;Functional mobility training;Therapeutic activities;Therapeutic exercise;Balance training;Neuromuscular re-education;Patient/family education;Passive range of motion;Vestibular;Visual/perceptual remediation/compensation    PT Next Visit Plan  cont balance and vestibular exercises    PT Home Exercise Plan  x1 viewing, standing balance on foam, habituation , balance exercises - SLS and tandem       Patient will benefit from skilled therapeutic intervention in order to improve the following deficits and impairments:  Abnormal gait, Decreased activity tolerance, Decreased balance, Decreased mobility, Dizziness  Visit Diagnosis: Unsteadiness on feet     Problem List Patient Active Problem List   Diagnosis Date Noted  . Atypical chest pain 06/14/2017  . Dizziness 06/14/2017  . Easy bruising 05/30/2017  . Hyperuricemia 05/03/2017  . Arthralgia 05/01/2017  . Hair loss 05/01/2017  . Vitamin D deficiency 10/31/2016  . Redness of skin, feet 10/31/2016  . Diastolic dysfunction 41/32/4401  . Vertigo 12/24/2015  . Fatigue 12/24/2015  . Nonspecific abnormal electrocardiogram (ECG) (EKG) 12/24/2015  . Bilateral carotid artery disease, Mild 05/31/2015  . Thyroid nodule 05/31/2015  . Psoriasis   . Scoliosis   . Diabetes type 2, controlled ([place]) 06/03/2008  . Dyslipidemia 06/03/2008  . CARPAL TUNNEL SYNDROME 06/03/2008  . Essential hypertension 06/03/2008  . ALLERGIC RHINITIS 06/03/2008  . Asthma 06/03/2008  . GERD 06/03/2008  . Osteopenia 06/03/2008  . COLONIC POLYPS, HX OF 06/03/2008    Alda Lea, PT 07/05/2017, 6:29 PM  [facility] [address] [place], [place], [address] Phone: [phone]   Fax:  [phone]  Name: CLARENE CURRAN MRN: [id number] Date of Birth: 22-Sep-1945

## 2017-07-06 ENCOUNTER — Ambulatory Visit: Payer: Medicare Other | Admitting: Physical Therapy

## 2017-07-06 DIAGNOSIS — H8113 Benign paroxysmal vertigo, bilateral: Secondary | ICD-10-CM | POA: Diagnosis not present

## 2017-07-06 DIAGNOSIS — R2689 Other abnormalities of gait and mobility: Secondary | ICD-10-CM

## 2017-07-06 DIAGNOSIS — R2681 Unsteadiness on feet: Secondary | ICD-10-CM

## 2017-07-07 ENCOUNTER — Encounter: Payer: Self-pay | Admitting: Physical Therapy

## 2017-07-07 NOTE — Therapy (Signed)
[facility] [address] [place], [place], [address] Phone: [phone]   Fax:  [phone]  Physical Therapy Treatment  Patient Details  Name: [name] MRN: [id number] Date of Birth: [birth year] Referring Provider: [name], MD    Encounter Date: 07/06/2017  PT End of Session - 07/07/17 1353    Visit Number  7    Number of Visits  9    Date for PT Re-Evaluation  07/13/17    Authorization Type  [employer] Medicare    Authorization Time Period  06/13/17 to 09/11/2017    PT Start Time  0931    PT Stop Time  1014    PT Time Calculation (min)  43 min       Past Medical History:  Diagnosis Date  . ALLERGIC RHINITIS   . ANEMIA-NOS   . ASTHMA   . Carpal tunnel syndrome   . COLONIC POLYPS, HX OF   . DIABETES MELLITUS, TYPE II    diet controlled  . GERD   . HYPERLIPIDEMIA   . HYPERTENSION   . OSTEOPENIA   . Psoriasis    severe, began soriatane 01/2012  . Rectal fissure   . Scoliosis     Past Surgical History:  Procedure Laterality Date  . benign rectal growth  2004   removed by Dr. Zella Richer  . BREAST SURGERY  1988   biopsy  . CESAREAN SECTION      There were no vitals filed for this visit.  Subjective Assessment - 07/07/17 1348    Subjective  Pt states she is not as good today as she was at time of previous PT session (on Tues. this week); pt states that she thinks that the exercises she did with all the head movements caused the vertigo to return    Pertinent History  DM, HTN, osteopenia, psoriasis, scoliosis    Patient Stated Goals  find out what is going on-- I don't think it's just positional    Currently in Pain?  No/denies         Self care; discussed pt's status with increased symptoms since treatment session on 07-04-17     Sit to sidelying right - no nystagmus but pt reported some dizziness in this position Sit to sidelying left - no nystagmus but c/o light-headedness with return  to upright position  (BP not recorded due to pt stating she had been taking readings at home and stating it had not been high)  Rt Dix-Hallpike test - no nystagmus but c/o dizziness in test position (pt denies room spinning vertigo  Lt Dix-Hallpike test - no nystagmus- pt reports less dizziness in this position compared to that in Rt Dix-Hallpike position        NeuroRe-ed;  Reviewed SLS and tandem stance for HEP;  Pt performed tandem stance with UE support for approx. 15 secs each position SLS with UE support 10 secs with UE support   Vestibular Treatment/Exercise - 07/07/17 0001      Vestibular Treatment/Exercise   Vestibular Treatment Provided  Canalith Repositioning    Canalith Repositioning  Epley Manuever Right       EPLEY MANUEVER RIGHT   Number of Reps   3    Overall Response  Improved Symptoms    Response Details   no nystagmus was noted on any rep of Epley manuever but pt reported improvement in symptoms on reps 2 and 3 with most improvement reported after 3rd rep  Pt amb. Around room, making turns after 3rd rep of Epley maneuver - no LOB occurred      PT Education - 07/07/17 1352    Education provided  Yes    Education Details  Pt instructed to continue to do SLS and tandem stance for balance exercises and do walking - incorporating backwards and sidestepping and to hold on the exercises with head movements at this time    Person(s) Educated  Patient    Methods  Explanation;Demonstration    Comprehension  Verbalized understanding;Returned demonstration          PT Long Term Goals - 06/13/17 1655      PT LONG TERM GOAL #1   Title  Patient will have negative positional testing for BPPV (Target all LTGs 07/13/17)    Baseline  + bil posterior (left worse)    Time  4    Period  Weeks    Status  New      PT LONG TERM GOAL #2   Title  Patient will complete FGA to assess balance and gait with goal to be set as appropriate.    Time  1    Period  Weeks     Status  New      PT LONG TERM GOAL #3   Title  Patient will complete gait velocity assessment and goal to be set as appropriate.     Time  1    Period  Weeks    Status  New      PT LONG TERM GOAL #4   Title  Patient will be independent with HEP for balance and able to continue/resume her walking program (as prior to vertigo/imbalance).     Time  4    Period  Weeks    Status  New            Plan - 07/07/17 1354    Clinical Impression Statement  Pt reported feeling much better at end of session, after completion of 3 reps of Epley maneuver for Rt BPPV (based on pt's subjective symptoms as no nystagmus noted with any positional testing);  vertigo appears to be multi-factorial and not strictly BPPV , but habituation helps to decrease vertigo per pt's report    Rehab Potential  Good    Clinical Impairments Affecting Rehab Potential  elevated BP (>200/100) limited today's participation    PT Frequency  2x / week    PT Duration  4 weeks    PT Treatment/Interventions  ADLs/Self Care Home Management;Canalith Repostioning;Gait training;Stair training;Functional mobility training;Therapeutic activities;Therapeutic exercise;Balance training;Neuromuscular re-education;Patient/family education;Passive range of motion;Vestibular;Visual/perceptual remediation/compensation    PT Next Visit Plan  reassess vertigo based on symptoms on 07-06-17;  D/C if resolved?    PT Home Exercise Plan  x1 viewing, standing balance on foam, habituation , balance exercises - SLS and tandem    Consulted and Agree with Plan of Care  Patient       Patient will benefit from skilled therapeutic intervention in order to improve the following deficits and impairments:  Abnormal gait, Decreased activity tolerance, Decreased balance, Decreased mobility, Dizziness  Visit Diagnosis: BPPV (benign paroxysmal positional vertigo), bilateral  Unsteadiness on feet  Other abnormalities of gait and mobility     Problem  List Patient Active Problem List   Diagnosis Date Noted  . Atypical chest pain 06/14/2017  . Dizziness 06/14/2017  . Easy bruising 05/30/2017  . Hyperuricemia 05/03/2017  . Arthralgia 05/01/2017  . Hair loss 05/01/2017  .  Vitamin D deficiency 10/31/2016  . Redness of skin, feet 10/31/2016  . Diastolic dysfunction 35/59/7416  . Vertigo 12/24/2015  . Fatigue 12/24/2015  . Nonspecific abnormal electrocardiogram (ECG) (EKG) 12/24/2015  . Bilateral carotid artery disease, Mild 05/31/2015  . Thyroid nodule 05/31/2015  . Psoriasis   . Scoliosis   . Diabetes type 2, controlled ([facility]) 06/03/2008  . Dyslipidemia 06/03/2008  . CARPAL TUNNEL SYNDROME 06/03/2008  . Essential hypertension 06/03/2008  . ALLERGIC RHINITIS 06/03/2008  . Asthma 06/03/2008  . GERD 06/03/2008  . Osteopenia 06/03/2008  . COLONIC POLYPS, HX OF 06/03/2008    Alda Lea, PT 07/07/2017, 2:01 PM  [facility] [address] [place], [place], [address] Phone: [phone]   Fax:  [phone]  Name: ETHELDA DEANGELO MRN: [id number] Date of Birth: 1945/04/23

## 2017-07-12 ENCOUNTER — Encounter: Payer: Self-pay | Admitting: Physical Therapy

## 2017-07-12 ENCOUNTER — Ambulatory Visit: Payer: Medicare Other | Admitting: Physical Therapy

## 2017-07-12 DIAGNOSIS — R2689 Other abnormalities of gait and mobility: Secondary | ICD-10-CM

## 2017-07-12 DIAGNOSIS — R2681 Unsteadiness on feet: Secondary | ICD-10-CM

## 2017-07-12 DIAGNOSIS — H8113 Benign paroxysmal vertigo, bilateral: Secondary | ICD-10-CM | POA: Diagnosis not present

## 2017-07-12 NOTE — Therapy (Signed)
[facility] [address] [place], [place], [address] Phone: [phone]   Fax:  [phone]  Physical Therapy Treatment  Patient Details  Name: [name] MRN: [id number] Date of Birth: [birth year] Referring Provider: [name], MD    Encounter Date: 07/12/2017  PT End of Session - 07/12/17 1429    Visit Number  8    Number of Visits  9    Date for PT Re-Evaluation  07/13/17    Authorization Type  [employer]HC Medicare    Authorization Time Period  06/13/17 to 09/11/2017    PT Start Time  0846    PT Stop Time  0932    PT Time Calculation (min)  46 min    Activity Tolerance  Patient tolerated treatment well    Behavior During Therapy  [facility] for tasks assessed/performed;Anxious       Past Medical History:  Diagnosis Date  . ALLERGIC RHINITIS   . ANEMIA-NOS   . ASTHMA   . Carpal tunnel syndrome   . COLONIC POLYPS, HX OF   . DIABETES MELLITUS, TYPE II    diet controlled  . GERD   . HYPERLIPIDEMIA   . HYPERTENSION   . OSTEOPENIA   . Psoriasis    severe, began soriatane 01/2012  . Rectal fissure   . Scoliosis     Past Surgical History:  Procedure Laterality Date  . benign rectal growth  2004   removed by Dr. Zella Richer  . BREAST SURGERY  1988   biopsy  . CESAREAN SECTION      There were no vitals filed for this visit.  Subjective Assessment - 07/12/17 0847    Subjective  Felt better after Epley last session. No episodes since. Don't feel as sharp as 3 months ago. Balance seems better. Stopped doing exercises that require her to look up and down quickly because they seem to make her feel worse. Still doing these movements in everyday life (as in grocery store, does scan higher shelves and lower shelves). Has been practicing walking backwards in the store (and decided not to practice when [place] alone in the park).     Pertinent History  DM, HTN, osteopenia, psoriasis, scoliosis    Patient Stated Goals   find out what is going on-- I don't think it's just positional    Currently in Pain?  No/denies                            Balance Exercises - 07/12/17 1425      Balance Exercises: Standing   Standing Eyes Opened  Narrow base of support (BOS);Head turns;Solid surface feet together; partial tandem left and right    Standing Eyes Closed  Narrow base of support (BOS);Head turns;Solid surface feet together, partial tandem left and right    Balance Beam  blue beam crosswise: EO step off/on forward and backward alternating legs; marching; EC static standing feet apart    Gait with Head Turns  Forward;Cognitive challenge manipulating small ball hand to hand; A to Z naming foods      During ambulation with cognitive challenge, pt maintained straight path with no evidence of imbalance, however had great difficulty staying on task (naming foods starting with A to Z). She frequently said words that were not foods and/or struggled to find foods that fit the letter she was on.    PT Education - 07/12/17 1428    Education Details  rationale for maintaining neck ROM and moving head in all directions to avoid hypofunction; does not have to be fast movements    Person(s) Educated  Patient    Methods  Explanation;Demonstration    Comprehension  Verbalized understanding;Returned demonstration          PT Long Term Goals - 06/13/17 1655      PT LONG TERM GOAL #1   Title  Patient will have negative positional testing for BPPV (Target all LTGs 07/13/17)    Baseline  + bil posterior (left worse)    Time  4    Period  Weeks    Status  New      PT LONG TERM GOAL #2   Title  Patient will complete FGA to assess balance and gait with goal to be set as appropriate.    Time  1    Period  Weeks    Status  New      PT LONG TERM GOAL #3   Title  Patient will complete gait velocity assessment and goal to be set as appropriate.     Time  1    Period  Weeks    Status  New      PT  LONG TERM GOAL #4   Title  Patient will be independent with HEP for balance and able to continue/resume her walking program (as prior to vertigo/imbalance).     Time  4    Period  Weeks    Status  New            Plan - 07/12/17 0929    Clinical Impression Statement  Patient with no indications of continued BPPV and agreed no need to do testing (she was afraid this could make it come back). Balance training completed with pt overall doing very well. Most challenging on compliant surface with eyes closed (as expected) with pt able to self-correct or catch her balance with single step forward or backward. Discussed PT POC moving forward. She is agreeable to instruction in basic balance HEP [facility] program) which we will do the end of this week and assess LTGs. Patient is to have an MRI 6/2 and agreed will not d/c her until results known in case further vestibular rehab is indicated. She is agreeable with this plan.     Rehab Potential  Good    Clinical Impairments Affecting Rehab Potential  elevated BP (>200/100) limited today's participation    PT Frequency  2x / week    PT Duration  4 weeks    PT Treatment/Interventions  ADLs/Self Care Home Management;Canalith Repostioning;Gait training;Stair training;Functional mobility training;Therapeutic activities;Therapeutic exercise;Balance training;Neuromuscular re-education;Patient/family education;Passive range of motion;Vestibular;Visual/perceptual remediation/compensation    PT Next Visit Plan  check LTGs; instruct in [place]; Epley handout;     PT Home Exercise Plan  x1 viewing (as of 5/29 pt reports not doing bc make her worse), standing balance on foam, habituation , balance exercises - SLS and tandem    Consulted and Agree with Plan of Care  Patient       Patient will benefit from skilled therapeutic intervention in order to improve the following deficits and impairments:  Abnormal gait, Decreased activity tolerance, Decreased balance, Decreased  mobility, Dizziness  Visit Diagnosis: Unsteadiness on feet  Other abnormalities of gait and mobility     Problem List Patient Active Problem List   Diagnosis Date Noted  . Atypical chest pain 06/14/2017  . Dizziness 06/14/2017  . Easy bruising 05/30/2017  . Hyperuricemia  05/03/2017  . Arthralgia 05/01/2017  . Hair loss 05/01/2017  . Vitamin D deficiency 10/31/2016  . Redness of skin, feet 10/31/2016  . Diastolic dysfunction 95/10/3265  . Vertigo 12/24/2015  . Fatigue 12/24/2015  . Nonspecific abnormal electrocardiogram (ECG) (EKG) 12/24/2015  . Bilateral carotid artery disease, Mild 05/31/2015  . Thyroid nodule 05/31/2015  . Psoriasis   . Scoliosis   . Diabetes type 2, controlled ([facility]) 06/03/2008  . Dyslipidemia 06/03/2008  . CARPAL TUNNEL SYNDROME 06/03/2008  . Essential hypertension 06/03/2008  . ALLERGIC RHINITIS 06/03/2008  . Asthma 06/03/2008  . GERD 06/03/2008  . Osteopenia 06/03/2008  . COLONIC POLYPS, HX OF 06/03/2008    Rexanne Mano, PT 07/12/2017, 2:35 PM  [facility] [address] [place], [place], [address] Phone: [phone]   Fax:  [phone]  Name: LADELLE TEODORO MRN: [id number] Date of Birth: 1945-03-17

## 2017-07-14 ENCOUNTER — Ambulatory Visit: Payer: Medicare Other | Admitting: Physical Therapy

## 2017-07-14 ENCOUNTER — Encounter: Payer: Self-pay | Admitting: Physical Therapy

## 2017-07-14 DIAGNOSIS — H8113 Benign paroxysmal vertigo, bilateral: Secondary | ICD-10-CM | POA: Diagnosis not present

## 2017-07-14 DIAGNOSIS — R2681 Unsteadiness on feet: Secondary | ICD-10-CM

## 2017-07-14 NOTE — Therapy (Signed)
[facility] [address] [place], [place], [address] Phone: [phone]   Fax:  [phone]  Physical Therapy Treatment  Patient Details  Name: [name] MRN: [id number] Date of Birth: Feb 15, 1946 Referring Provider: [name], MD    Encounter Date: 07/14/2017  PT End of Session - 07/14/17 1821    Visit Number  9    Number of Visits  9    Date for PT Re-Evaluation  07/13/17    Authorization Type  [employer]HC Medicare    Authorization Time Period  06/13/17 to 09/11/2017    PT Start Time  1150    PT Stop Time  1228    PT Time Calculation (min)  38 min    Activity Tolerance  Patient tolerated treatment well    Behavior During Therapy  [facility] for tasks assessed/performed       Past Medical History:  Diagnosis Date  . ALLERGIC RHINITIS   . ANEMIA-NOS   . ASTHMA   . Carpal tunnel syndrome   . COLONIC POLYPS, HX OF   . DIABETES MELLITUS, TYPE II    diet controlled  . GERD   . HYPERLIPIDEMIA   . HYPERTENSION   . OSTEOPENIA   . Psoriasis    severe, began soriatane 01/2012  . Rectal fissure   . Scoliosis     Past Surgical History:  Procedure Laterality Date  . benign rectal growth  2004   removed by Dr. Zella Richer  . BREAST SURGERY  1988   biopsy  . CESAREAN SECTION      There were no vitals filed for this visit.  Subjective Assessment - 07/14/17 1153    Subjective  Has felt like she was going to have a spell several times and then didn't. What causes that?    Pertinent History  DM, HTN, osteopenia, psoriasis, scoliosis    Patient Stated Goals  find out what is going on-- I don't think it's just positional    Currently in Pain?  No/denies                            Balance Exercises - 07/14/17 1809      OTAGO PROGRAM   Head Movements  Sitting;5 reps    Neck Movements  Sitting;5 reps    Back Extension  -- deferred due to pt reports incr back pain with this ex previ    Trunk  Movements  Sitting;5 reps    Ankle Movements  Sitting;10 reps    Knee Flexor  5 reps standing    Hip ABductor  5 reps each leg, alternating    Ankle Plantorflexors  -- 5 reps no support    Ankle Dorsiflexors  -- support, 5 reps    Knee Bends  -- no support 5 reps    Backwards Walking  No support    Walking and Turning Around  -- deferred, pt already has a version she does    Sideways Walking  No assistive device    Tandem Stance  10 seconds, no support    Tandem Walk  No support    One Leg Stand  10 seconds, no support    Heel Walking  No support    Toe Walk  No support    Heel Toe Walking Backward  No support    Sit to Stand  -- 2 reps no support    Stair Walking  -- deferred pt  does not have a place to do this    Overall OTAGO Comments  Due to time constraints only completed enough reps of exercises to assure pt was doing correctly; pt had experience with many of these exercises        PT Education - 07/14/17 1814    Education Details  [place] HEP, rationale for exercises and benefits to reduce fall risk; If remains asymptomatic and does not have ?s re: exercises she can cancel her appts next week. She is nervous about lying supine 45 minutes for MRI on Sunday and wants to keep the Tues appt for sure (in case symptoms return)    Person(s) Educated  Patient    Methods  Explanation;Demonstration;Verbal cues;Handout    Comprehension  Verbalized understanding;Returned demonstration          PT Long Term Goals - 07/14/17 1911      PT LONG TERM GOAL #1   Title  Patient will have negative positional testing for BPPV (Target all LTGs 07/13/17)    Baseline  + bil posterior (left worse); 5/31 pt did not want to assess--asymptomatic since last round of Epley maneuvers and does not want to take a chance     Time  4    Period  Weeks    Status  Deferred      PT LONG TERM GOAL #2   Title  Patient will complete FGA to assess balance and gait with goal to be set as appropriate.    Baseline   5/16 24/30    Time  1    Period  Weeks    Status  Achieved      PT LONG TERM GOAL #3   Title  Patient will complete gait velocity assessment and goal to be set as appropriate.     Baseline  5/16  3.27 ft/sec (above norm for age with no goal needed)    Time  1    Period  Weeks    Status  Achieved      PT LONG TERM GOAL #4   Title  Patient will be independent with HEP for balance and able to continue/resume her walking program (as prior to vertigo/imbalance).     Baseline  5/31 has not fully resumed walking program; HEP updated/finalized this date    Time  4    Period  Weeks    Status  Partially Met            Plan - 07/14/17 1822    Clinical Impression Statement  Skilled session focused on instructing pt in [place] exercise program for improved balance. Patient has had no further episodes of vertigo, therefore deferred assessing for BPPV. Remaining LTGs assessed with pt meeting 2 of 4, partially met 1 of 4 (progressed but not to goal level), and final goal deferred. Patient has 2 additional appts scheduled for next week in case of recurrence due to positioning during her upcoming MRI 6/2. If patient returns after MRI, will need to recertify for those additional visits.     Rehab Potential  Good    Clinical Impairments Affecting Rehab Potential  elevated BP (>200/100) limited today's participation    PT Frequency  2x / week    PT Duration  4 weeks    PT Treatment/Interventions  ADLs/Self Care Home Management;Canalith Repostioning;Gait training;Stair training;Functional mobility training;Therapeutic activities;Therapeutic exercise;Balance training;Neuromuscular re-education;Patient/family education;Passive range of motion;Vestibular;Visual/perceptual remediation/compensation    PT Next Visit Plan  check result of MRI and if need to assess for  BPPV; check for ?s re: [place] pgm; give Epley handout (pt request)    PT Home Exercise Plan  x1 viewing (as of 5/29 pt reports not doing bc make  her worse), standing balance on foam, habituation , balance exercises - SLS and tandem    Consulted and Agree with Plan of Care  Patient       Patient will benefit from skilled therapeutic intervention in order to improve the following deficits and impairments:  Abnormal gait, Decreased activity tolerance, Decreased balance, Decreased mobility, Dizziness  Visit Diagnosis: Unsteadiness on feet     Problem List Patient Active Problem List   Diagnosis Date Noted  . Atypical chest pain 06/14/2017  . Dizziness 06/14/2017  . Easy bruising 05/30/2017  . Hyperuricemia 05/03/2017  . Arthralgia 05/01/2017  . Hair loss 05/01/2017  . Vitamin D deficiency 10/31/2016  . Redness of skin, feet 10/31/2016  . Diastolic dysfunction 63/87/5643  . Vertigo 12/24/2015  . Fatigue 12/24/2015  . Nonspecific abnormal electrocardiogram (ECG) (EKG) 12/24/2015  . Bilateral carotid artery disease, Mild 05/31/2015  . Thyroid nodule 05/31/2015  . Psoriasis   . Scoliosis   . Diabetes type 2, controlled ([place] [facility]) 06/03/2008  . Dyslipidemia 06/03/2008  . CARPAL TUNNEL SYNDROME 06/03/2008  . Essential hypertension 06/03/2008  . ALLERGIC RHINITIS 06/03/2008  . Asthma 06/03/2008  . GERD 06/03/2008  . Osteopenia 06/03/2008  . COLONIC POLYPS, HX OF 06/03/2008    Rexanne Mano, PT 07/14/2017, 7:20 PM  [facility] [address] [place], [place], [address] Phone: [phone]   Fax:  [phone]  Name: [name] MRN: [id number] Date of Birth: 08-Aug-1945

## 2017-07-16 ENCOUNTER — Ambulatory Visit
Admission: RE | Admit: 2017-07-16 | Discharge: 2017-07-16 | Disposition: A | Discharge status: Home / Self Care | Payer: Medicare Other | Source: Ambulatory Visit | Attending: Internal Medicine | Admitting: Internal Medicine

## 2017-07-16 DIAGNOSIS — H81399 Other peripheral vertigo, unspecified ear: Secondary | ICD-10-CM

## 2017-07-17 ENCOUNTER — Encounter: Payer: Self-pay | Admitting: Emergency Medicine

## 2017-07-17 ENCOUNTER — Telehealth: Payer: Self-pay | Admitting: Internal Medicine

## 2017-07-17 NOTE — Telephone Encounter (Signed)
Results mailed 

## 2017-07-17 NOTE — Telephone Encounter (Signed)
Copied from [facility] [id number]. Topic: Quick Communication - See Telephone Encounter >> Jul 17, 2017  1:54 PM Rutherford Nail, NT wrote: CRM for notification. See Telephone encounter for: 07/17/17. Patient is requesting a written MRI results from 07/16/17 be sent to her home address. Please advise. CB#: [phone].

## 2017-07-18 ENCOUNTER — Encounter: Payer: Self-pay | Admitting: Physical Therapy

## 2017-07-21 ENCOUNTER — Encounter: Payer: Self-pay | Admitting: Physical Therapy

## 2017-07-27 ENCOUNTER — Encounter: Payer: Self-pay | Admitting: Physical Therapy

## 2017-07-30 NOTE — Progress Notes (Signed)
Subjective:    Patient ID: [name], female    DOB: 08/24/1945, 72 y.o.   MRN: [id number]  HPI The patient is here for follow up to discuss her recent MRI.  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  It was mild- mod in 2012, present but no quantified in 2017.   Hypertension:  Her BP at home is low to mid 110's/low 60's.  She is trying to get back to walking regularly.  She could do better with diet.    Hyperlipidemia: She is taking the zetia every other day. She is fairly compliant with a low fat/cholesterol diet. She is exercising irregularly. She denies myalgias.   Diabetes: She is controlling her sugars with diet. She is somewhat compliant with a diabetic diet, but could do better. She is exercising irregularly.        MR Angiogram Head Wo Contrast CLINICAL DATA:  Benign positional vertigo since March.  EXAM: MRI HEAD WITHOUT CONTRAST  MRA HEAD WITHOUT CONTRAST  TECHNIQUE: Multiplanar, multiecho pulse sequences of the brain and surrounding structures were obtained without intravenous contrast. Angiographic images of the head were obtained using MRA technique without contrast.  COMPARISON:  12/18/2015  FINDINGS: MRI HEAD FINDINGS  Brain: Diffusion imaging does not show any acute or subacute infarction. There chronic small-vessel ischemic changes throughout the pons, thalami, basal ganglia and hemispheric white matter. No focal cerebellar insult. No cortical or large vessel territory infarction. No mass lesion, hemorrhage, hydrocephalus or extra-axial collection. CP angle regions appear normal. No sign of vestibular schwannoma or other CP angle abnormality.  Vascular: Major vessels at the base of the brain show flow.  Skull and upper cervical spine: Negative  Sinuses/Orbits: Clear/normal  Other: Mastoids are clear.  MRA HEAD FINDINGS  Both internal carotid arteries are widely patent into the brain.  The anterior and middle cerebral vessels are patent without proximal stenosis, aneurysm or vascular malformation.  Both vertebral arteries are patent to the basilar. No basilar stenosis. Posterior circulation branch vessels are normal.  IMPRESSION: No acute or reversible finding. Advanced chronic small-vessel ischemic changes throughout the brain as outlined above. No specific cause of dizziness identified.  Normal intracranial MR angiography of the large and medium size vessels.  Electronically Signed   By: Nelson Chimes M.D.   On: 07/16/2017 11:03 MR Brain Wo Contrast CLINICAL DATA:  Benign positional vertigo since March.  EXAM: MRI HEAD WITHOUT CONTRAST  MRA HEAD WITHOUT CONTRAST  TECHNIQUE: Multiplanar, multiecho pulse sequences of the brain and surrounding structures were obtained without intravenous contrast. Angiographic images of the head were obtained using MRA technique without contrast.  COMPARISON:  12/18/2015  FINDINGS: MRI HEAD FINDINGS  Brain: Diffusion imaging does not show any acute or subacute infarction. There chronic small-vessel ischemic changes throughout the pons, thalami, basal ganglia and hemispheric white matter. No focal cerebellar insult. No cortical or large vessel territory infarction. No mass lesion, hemorrhage, hydrocephalus or extra-axial collection. CP angle regions appear normal. No sign of vestibular schwannoma or other CP angle abnormality.  Vascular: Major vessels at the base of the brain show flow.  Skull and upper cervical spine: Negative  Sinuses/Orbits: Clear/normal  Other: Mastoids are clear.  MRA HEAD FINDINGS  Both internal carotid arteries are widely patent into the brain. The anterior and middle cerebral vessels are patent without proximal stenosis, aneurysm or vascular malformation.  Both vertebral arteries are patent to the basilar. No basilar stenosis. Posterior  circulation branch vessels are  normal.  IMPRESSION: No acute or reversible finding. Advanced chronic small-vessel ischemic changes throughout the brain as outlined above. No specific cause of dizziness identified.  Normal intracranial MR angiography of the large and medium size vessels.  Electronically Signed   By: Nelson Chimes M.D.   On: 07/16/2017 11:03   Medications and allergies reviewed with patient and updated if appropriate.  Patient Active Problem List   Diagnosis Date Noted  . Atypical chest pain 06/14/2017  . Dizziness 06/14/2017  . Easy bruising 05/30/2017  . Hyperuricemia 05/03/2017  . Arthralgia 05/01/2017  . Hair loss 05/01/2017  . Vitamin D deficiency 10/31/2016  . Redness of skin, feet 10/31/2016  . Diastolic dysfunction 51/88/4166  . Vertigo 12/24/2015  . Fatigue 12/24/2015  . Nonspecific abnormal electrocardiogram (ECG) (EKG) 12/24/2015  . Bilateral carotid artery disease, Mild 05/31/2015  . Thyroid nodule 05/31/2015  . Psoriasis   . Scoliosis   . Diabetes type 2, controlled ([place]) 06/03/2008  . Dyslipidemia 06/03/2008  . CARPAL TUNNEL SYNDROME 06/03/2008  . Essential hypertension 06/03/2008  . ALLERGIC RHINITIS 06/03/2008  . Asthma 06/03/2008  . GERD 06/03/2008  . Osteopenia 06/03/2008  . COLONIC POLYPS, HX OF 06/03/2008    Current Outpatient Medications on File Prior to Visit  Medication Sig Dispense Refill  . blood glucose meter kit and supplies KIT Dispense based on patient and insurance preference. ONE TOUCH ULTRA 1 each 0  . Cholecalciferol (VITAMIN D3) 2000 units TABS Take by mouth.    . clobetasol (TEMOVATE) 0.05 % external solution Apply 1 application topically at bedtime.     . Coenzyme Q10 (COQ10) 200 MG CAPS Take 1 capsule by mouth daily.    . diazepam (VALIUM) 2 MG tablet Take one every 6 hours as needed for vertigo 30 tablet 0  . doxylamine, Sleep, (UNISOM) 25 MG tablet Take 15 mg by mouth at bedtime as needed for sleep.     Scarlette Shorts SURECLICK 50 MG/ML injection  Inject into the skin once a week. Once a week sometimes on sundays or mondays. Patient unsure of dose    . glucose blood (ONE TOUCH ULTRA TEST) test strip Use test strip to check blood sugar at least 3 times 300 each 1  . halobetasol (ULTRAVATE) 0.05 % cream Apply topically 2 (two) times daily.    Marland Kitchen ibuprofen (ADVIL,MOTRIN) 200 MG tablet Take 200 mg by mouth every 6 (six) hours as needed for mild pain.    . Lancets (ONETOUCH ULTRASOFT) lancets Use lancet to test blood sugar at least twice a day or as directed by your physician. 200 each 11  . loratadine (CLARITIN REDITABS) 10 MG dissolvable tablet Take 10 mg by mouth every other day as needed for allergies.    . Multiple Vitamin (MULTIVITAMIN) tablet Take 1 tablet by mouth daily.      . nebivolol (BYSTOLIC) 2.5 MG tablet Take 1 tablet (2.5 mg total) by mouth daily. 30 tablet 5  . olmesartan (BENICAR) 20 MG tablet Take 1 tablet (20 mg total) by mouth 2 (two) times daily. 180 tablet 1  . omeprazole (PRILOSEC) 20 MG capsule Take 20 mg by mouth daily. OTC    . ondansetron (ZOFRAN ODT) 4 MG disintegrating tablet Take 1 tablet (4 mg total) by mouth every 8 (eight) hours as needed for nausea or vomiting. 20 tablet 0  . oxymetazoline (AFRIN 12 HOUR) 0.05 % nasal spray Place 1 spray into the nose daily.      Marland Kitchen  ZETIA 10 MG tablet TAKE ONE (1) TABLET BY MOUTH EVERY DAY (Patient taking differently: TAKE ONE (1) TABLET BY MOUTH EVERY OTHER DAY) 90 tablet 1   No current facility-administered medications on file prior to visit.     Past Medical History:  Diagnosis Date  . ALLERGIC RHINITIS   . ANEMIA-NOS   . ASTHMA   . Carpal tunnel syndrome   . COLONIC POLYPS, HX OF   . DIABETES MELLITUS, TYPE II    diet controlled  . GERD   . HYPERLIPIDEMIA   . HYPERTENSION   . OSTEOPENIA   . Psoriasis    severe, began soriatane 01/2012  . Rectal fissure   . Scoliosis     Past Surgical History:  Procedure Laterality Date  . benign rectal growth  2004    removed by Dr. Zella Richer  . BREAST SURGERY  1988   biopsy  . CESAREAN SECTION      Social History   Socioeconomic History  . Marital status: Single    Spouse name: Not on file  . Number of children: Not on file  . Years of education: Not on file  . Highest education level: Not on file  Occupational History  . Not on file  Social Needs  . Financial resource strain: Not on file  . Food insecurity:    Worry: Not on file    Inability: Not on file  . Transportation needs:    Medical: Not on file    Non-medical: Not on file  Tobacco Use  . Smoking status: Former Smoker    Last attempt to quit: 02/14/1974    Years since quitting: 43.4  . Smokeless tobacco: Never Used  Substance and Sexual Activity  . Alcohol use: No  . Drug use: No  . Sexual activity: Not on file  Lifestyle  . Physical activity:    Days per week: Not on file    Minutes per session: Not on file  . Stress: Not on file  Relationships  . Social connections:    Talks on phone: Not on file    Gets together: Not on file    Attends religious service: Not on file    Active member of club or organization: Not on file    Attends meetings of clubs or organizations: Not on file    Relationship status: Not on file  Other Topics Concern  . Not on file  Social History Narrative  . Not on file    Family History  Problem Relation Age of Onset  . Lung cancer Father   . Arthritis Other        Parents  . Asthma Other        parent, other relative  . Breast cancer Other        other relative  . Hypertension Other        parent, other relative  . Heart disease Other        parent, other relative    Review of Systems  Musculoskeletal: Positive for gait problem (balance is not as good as it used to).  Neurological: Negative for dizziness, light-headedness and headaches.       Objective:   Vitals:   07/31/17 1500  BP: (!) 162/94  Pulse: 80  Resp: 16  Temp: 99.2 F (37.3 C)  SpO2: 95%   BP Readings  from Last 3 Encounters:  07/31/17 (!) 162/94  07/03/17 (!) 154/110  06/14/17 (!) 158/92   Wt Readings from Last 3  Encounters:  07/31/17 142 lb (64.4 kg)  07/03/17 143 lb (64.9 kg)  06/14/17 142 lb (64.4 kg)   Body mass index is 26.66 kg/m.   Physical Exam  Constitutional: She is oriented to person, place, and time. She appears well-developed and well-nourished. No distress.  HENT:  Head: Normocephalic and atraumatic.  Neurological: She is alert and oriented to person, place, and time.  Skin: She is not diaphoretic.           Assessment & Plan:   25 minutes were spent face-to-face with the patient, over 50% of which was spent counseling regarding her MRI - causes of the chronic small vessel disease, risk reduction and potential consequences.      See Problem List for Assessment and Plan of chronic medical problems.

## 2017-07-31 ENCOUNTER — Ambulatory Visit (INDEPENDENT_AMBULATORY_CARE_PROVIDER_SITE_OTHER): Payer: Medicare Other | Admitting: Internal Medicine

## 2017-07-31 ENCOUNTER — Encounter: Payer: Self-pay | Admitting: Internal Medicine

## 2017-07-31 VITALS — BP 162/94 | HR 80 | Temp 99.2°F | Resp 16 | Wt 142.0 lb

## 2017-07-31 DIAGNOSIS — I10 Essential (primary) hypertension: Secondary | ICD-10-CM | POA: Diagnosis not present

## 2017-07-31 DIAGNOSIS — E785 Hyperlipidemia, unspecified: Secondary | ICD-10-CM | POA: Diagnosis not present

## 2017-07-31 DIAGNOSIS — E1165 Type 2 diabetes mellitus with hyperglycemia: Secondary | ICD-10-CM | POA: Diagnosis not present

## 2017-07-31 DIAGNOSIS — I679 Cerebrovascular disease, unspecified: Secondary | ICD-10-CM

## 2017-07-31 MED ORDER — PITAVASTATIN CALCIUM 1 MG PO TABS: 1.0000 mg | ORAL_TABLET | Freq: Every day | ORAL | 5 refills | Status: DC

## 2017-07-31 NOTE — Assessment & Plan Note (Addendum)
zetia every other day (can not tolerate it due to loose bowels) Hold zetia Trial of pitavastatin 1 mg every other day - has had muscle aches with other statins but willing to try a new one - d/c if develops muscle pain

## 2017-07-31 NOTE — Assessment & Plan Note (Signed)
Diet controlled Stressed good sugar control Increase exercise Increase compliance to diabetic diet

## 2017-07-31 NOTE — Assessment & Plan Note (Signed)
BP well controlled at home, but elevated here Current regimen effective per home BP and well tolerated Continue current medications at current doses

## 2017-07-31 NOTE — Assessment & Plan Note (Signed)
Reveiwed MRI Discussed causes of the chronic small vessel disease, risk reduction and potential consequences

## 2017-07-31 NOTE — Patient Instructions (Addendum)
Continue your current medications.  Work on increasing your exercise.  Continue to work on [employer].     Try Pitavastatin twice a week.  If you have muscle pain stop the medication.

## 2017-08-07 ENCOUNTER — Other Ambulatory Visit: Payer: Self-pay | Admitting: Internal Medicine

## 2017-08-07 DIAGNOSIS — Z1231 Encounter for screening mammogram for malignant neoplasm of breast: Secondary | ICD-10-CM

## 2017-08-31 ENCOUNTER — Ambulatory Visit
Admission: RE | Admit: 2017-08-31 | Discharge: 2017-08-31 | Disposition: A | Discharge status: Home / Self Care | Payer: Medicare Other | Source: Ambulatory Visit | Attending: Internal Medicine | Admitting: Internal Medicine

## 2017-08-31 DIAGNOSIS — Z1231 Encounter for screening mammogram for malignant neoplasm of breast: Secondary | ICD-10-CM

## 2017-10-06 ENCOUNTER — Emergency Department (HOSPITAL_COMMUNITY): Payer: Medicare Other

## 2017-10-06 ENCOUNTER — Other Ambulatory Visit: Payer: Self-pay

## 2017-10-06 ENCOUNTER — Emergency Department (HOSPITAL_COMMUNITY)
Admission: EM | Admit: 2017-10-06 | Discharge: 2017-10-06 | Disposition: A | Discharge status: Home / Self Care | Payer: Medicare Other | Attending: Emergency Medicine | Admitting: Emergency Medicine

## 2017-10-06 ENCOUNTER — Encounter (HOSPITAL_COMMUNITY): Payer: Self-pay | Admitting: Emergency Medicine

## 2017-10-06 DIAGNOSIS — S0993XA Unspecified injury of face, initial encounter: Secondary | ICD-10-CM | POA: Diagnosis present

## 2017-10-06 DIAGNOSIS — S0081XA Abrasion of other part of head, initial encounter: Secondary | ICD-10-CM | POA: Insufficient documentation

## 2017-10-06 DIAGNOSIS — Y998 Other external cause status: Secondary | ICD-10-CM | POA: Insufficient documentation

## 2017-10-06 DIAGNOSIS — Y9301 Activity, walking, marching and hiking: Secondary | ICD-10-CM | POA: Diagnosis not present

## 2017-10-06 DIAGNOSIS — I10 Essential (primary) hypertension: Secondary | ICD-10-CM | POA: Insufficient documentation

## 2017-10-06 DIAGNOSIS — T148XXA Other injury of unspecified body region, initial encounter: Secondary | ICD-10-CM | POA: Diagnosis not present

## 2017-10-06 DIAGNOSIS — E119 Type 2 diabetes mellitus without complications: Secondary | ICD-10-CM | POA: Insufficient documentation

## 2017-10-06 DIAGNOSIS — Z87891 Personal history of nicotine dependence: Secondary | ICD-10-CM | POA: Diagnosis not present

## 2017-10-06 DIAGNOSIS — S80812A Abrasion, left lower leg, initial encounter: Secondary | ICD-10-CM | POA: Insufficient documentation

## 2017-10-06 DIAGNOSIS — W01198A Fall on same level from slipping, tripping and stumbling with subsequent striking against other object, initial encounter: Secondary | ICD-10-CM | POA: Diagnosis not present

## 2017-10-06 DIAGNOSIS — J45909 Unspecified asthma, uncomplicated: Secondary | ICD-10-CM | POA: Insufficient documentation

## 2017-10-06 DIAGNOSIS — R0781 Pleurodynia: Secondary | ICD-10-CM

## 2017-10-06 DIAGNOSIS — Y92480 Sidewalk as the place of occurrence of the external cause: Secondary | ICD-10-CM | POA: Diagnosis not present

## 2017-10-06 DIAGNOSIS — S40811A Abrasion of right upper arm, initial encounter: Secondary | ICD-10-CM | POA: Diagnosis not present

## 2017-10-06 DIAGNOSIS — W19XXXA Unspecified fall, initial encounter: Secondary | ICD-10-CM

## 2017-10-06 LAB — COMPREHENSIVE METABOLIC PANEL
ALT: 24 U/L (ref 0–44)
AST: 20 U/L (ref 15–41)
Albumin: 3.9 g/dL (ref 3.5–5.0)
Alkaline Phosphatase: 73 U/L (ref 38–126)
Anion gap: 9 (ref 5–15)
BUN: 13 mg/dL (ref 8–23)
CO2: 28 mmol/L (ref 22–32)
Calcium: 9.8 mg/dL (ref 8.9–10.3)
Chloride: 105 mmol/L (ref 98–111)
Creatinine, Ser: 0.98 mg/dL (ref 0.44–1.00)
GFR calc Af Amer: >60 mL/min (ref >60)
GFR calc non Af Amer: 56 mL/min — ABNORMAL LOW (ref >60)
Glucose, Bld: 104 mg/dL — ABNORMAL HIGH (ref 70–99)
Potassium: 3.9 mmol/L (ref 3.5–5.1)
Sodium: 142 mmol/L (ref 135–145)
Total Bilirubin: 0.9 mg/dL (ref 0.3–1.2)
Total Protein: 7.7 g/dL (ref 6.5–8.1)

## 2017-10-06 LAB — CBC
HCT: 42.2 % (ref 36.0–46.0)
Hemoglobin: 13.5 g/dL (ref 12.0–15.0)
MCH: 30.7 pg (ref 26.0–34.0)
MCHC: 32 g/dL (ref 30.0–36.0)
MCV: 95.9 fL (ref 78.0–100.0)
Platelets: 283 10*3/uL (ref 150–400)
RBC: 4.4 MIL/uL (ref 3.87–5.11)
RDW: 13.2 % (ref 11.5–15.5)
WBC: 14.5 10*3/uL — ABNORMAL HIGH (ref 4.0–10.5)

## 2017-10-06 LAB — I-STAT TROPONIN, ED: Troponin i, poc: 0.01 ng/mL (ref 0.00–0.08)

## 2017-10-06 NOTE — ED Triage Notes (Signed)
Per [facility], [place] and [place] a limb and landed facefirst on the pavement. No loc, not on thinners. Abrasions to area between nose and upper lip, right arm skin tear and abrasion to left knee. C-collar in place due to age. VSS. Axox4.

## 2017-10-06 NOTE — ED Triage Notes (Signed)
Fell   Rt side pain when she takes a deep breath see other notes

## 2017-10-06 NOTE — ED Provider Notes (Signed)
[facility] Emergency Department Provider Note MRN:  [id number]  Arrival date & time: 10/06/17     Chief Complaint   Fall   History of Present Illness   [name] is a 72 y.o. year-old female with a history of hypertension, hyperlipidemia, asthma presenting to the ED with chief complaint of fall.  Shortly prior to arrival, patient was walking on the concrete walkway that was uneven.  She thinks that she lost her balance of the uneven slopes, falling forward onto her face.  Her hands were in her pockets and unable to fully brace her fall.  Denies blood thinner use, no loss of consciousness, abrasions to the face right arm left leg.  Also endorsing mild to moderate severity chest pain, located on the right lateral ribs, constant but worse with deep breathing.  Denies any recent fever cough, denies any significant symptoms such as dizziness or chest pain prior to the fall.  Review of Systems  A complete 10 system review of systems was obtained and all systems are negative except as noted in the HPI and PMH.   Patient's Health History    Past Medical History:  Diagnosis Date  . ALLERGIC RHINITIS   . ANEMIA-NOS   . ASTHMA   . Carpal tunnel syndrome   . COLONIC POLYPS, HX OF   . DIABETES MELLITUS, TYPE II    diet controlled  . GERD   . HYPERLIPIDEMIA   . HYPERTENSION   . OSTEOPENIA   . Psoriasis    severe, began soriatane 01/2012  . Rectal fissure   . Scoliosis     Past Surgical History:  Procedure Laterality Date  . benign rectal growth  2004   removed by Dr. Zella Richer  . BREAST BIOPSY Right   . BREAST EXCISIONAL BIOPSY Left   . BREAST SURGERY  1988   biopsy  . CESAREAN SECTION      Family History  Problem Relation Age of Onset  . Lung cancer Father   . Arthritis Other        Parents  . Asthma Other        parent, other relative  . Breast cancer Other        other relative  . Hypertension Other        parent, other relative  . Heart  disease Other        parent, other relative  . Breast cancer Maternal Aunt 68    Social History   Socioeconomic History  . Marital status: Single    Spouse name: Not on file  . Number of children: Not on file  . Years of education: Not on file  . Highest education level: Not on file  Occupational History  . Not on file  Social Needs  . Financial resource strain: Not on file  . Food insecurity:    Worry: Not on file    Inability: Not on file  . Transportation needs:    Medical: Not on file    Non-medical: Not on file  Tobacco Use  . Smoking status: Former Smoker    Last attempt to quit: 02/14/1974    Years since quitting: 43.6  . Smokeless tobacco: Never Used  Substance and Sexual Activity  . Alcohol use: No  . Drug use: No  . Sexual activity: Not on file  Lifestyle  . Physical activity:    Days per week: Not on file    Minutes per session: Not on file  .  Stress: Not on file  Relationships  . Social connections:    Talks on phone: Not on file    Gets together: Not on file    Attends religious service: Not on file    Active member of club or organization: Not on file    Attends meetings of clubs or organizations: Not on file    Relationship status: Not on file  . Intimate partner violence:    Fear of current or ex partner: Not on file    Emotionally abused: Not on file    Physically abused: Not on file    Forced sexual activity: Not on file  Other Topics Concern  . Not on file  Social History Narrative  . Not on file     Physical Exam  Vital Signs and Nursing Notes reviewed Vitals:   10/06/17 1345 10/06/17 1400  BP:    Pulse: 79 76  Resp: 18 15  Temp:    SpO2: 100% 94%    CONSTITUTIONAL: Well-appearing, NAD NEURO:  Alert and oriented x 3, no focal deficits EYES:  eyes equal and reactive ENT/NECK:  no LAD, no JVD CARDIO: Regular rate, well-perfused, normal S1 and S2 PULM:  CTAB no wheezing or rhonchi GI/GU:  normal bowel sounds, non-distended,  non-tender MSK/SPINE:  No gross deformities, no edema SKIN:  no rash, mild abrasions to the midline upper lip, scattered skin tears to the right forearm, abrasion to the left knee PSYCH:  Appropriate speech and behavior  Diagnostic and Interventional Summary    EKG Interpretation  Date/Time:    Ventricular Rate:    PR Interval:    QRS Duration:   QT Interval:    QTC Calculation:   R Axis:     Text Interpretation:        Labs Reviewed  CBC - Abnormal; Notable for the following components:      Result Value   WBC 14.5 (*)    All other components within normal limits  COMPREHENSIVE METABOLIC PANEL - Abnormal; Notable for the following components:   Glucose, Bld 104 (*)    GFR calc non Af Amer 56 (*)    All other components within normal limits  I-STAT TROPONIN, ED    DG Chest 2 View  Final Result    DG Knee Complete 4 Views Left  Final Result      Medications - No data to display   Procedures Critical Care  ED Course and Medical Decision Making  I have reviewed the triage vital signs and the nursing notes.  Pertinent labs & imaging results that were available during my care of the patient were reviewed by me and considered in my medical decision making (see below for details). Clinical Course as of Oct 07 1622  Fri Oct 06, 2017  1142 Ground-level fall, thought to be mechanical in nature.  Vital signs stable, no anticoagulation use, no focal neurological deficits, preserved range of motion of the neck with no midline tenderness to suggest fracture.  C-spine cleared clinically.  Awaiting imaging of the chest knee, screening labs.   [MB]  1402 Imaging with no acute injuries, no evidence of septal hematoma, no significant dental trauma, pain to the right thenar eminence, but no snuffbox tenderness, no concern for scaphoid fracture, largely no bony tenderness to suggest need for imaging.  Wound care provided, encouraged Tylenol and ibuprofen at home for pain  management.After the discussed management above, the patient was determined to be safe for discharge.  The patient  was in agreement with this plan and all questions regarding their care were answered.  ED return precautions were discussed and the patient will return to the ED with any significant worsening of condition.   [MB]    Clinical Course User Index [MB] Sedonia Small Barth Kirks, MD     Barth Kirks. Sedonia Small, [place] [email or web address]  Final Clinical Impressions(s) / ED Diagnoses     ICD-10-CM   1. Fall, initial encounter W19.XXXA   2. Rib pain on right side R07.81 DG Chest 2 View    DG Chest 2 View  3. Abrasion [email or web address]     ED Discharge Orders    None         Maudie Flakes, MD 10/06/17 [phone]

## 2017-10-06 NOTE — Discharge Instructions (Addendum)
You were evaluated in the Emergency Department and after careful evaluation, we did not find any emergent condition requiring admission or further testing in the hospital.  Your symptoms today seem to be due to abrasions and bruising related to the fall.  Please use Tylenol and ibuprofen at home for the pain, keep your wounds clean and use Neosporin to prevent scarring.  Please return to the Emergency Department if you experience any worsening of your condition.  We encourage you to follow up with a primary care provider.  Thank you for allowing [place] to be a part of your care.

## 2017-10-13 ENCOUNTER — Encounter: Payer: Self-pay | Admitting: Neurology

## 2017-10-13 ENCOUNTER — Encounter: Payer: Self-pay | Admitting: Cardiology

## 2017-10-13 ENCOUNTER — Other Ambulatory Visit: Payer: Self-pay | Admitting: Cardiology

## 2017-10-13 ENCOUNTER — Ambulatory Visit (INDEPENDENT_AMBULATORY_CARE_PROVIDER_SITE_OTHER): Payer: Medicare Other | Admitting: Cardiology

## 2017-10-13 VITALS — BP 120/80 | HR 75 | Ht 61.2 in | Wt 138.2 lb

## 2017-10-13 DIAGNOSIS — L539 Erythematous condition, unspecified: Secondary | ICD-10-CM | POA: Diagnosis not present

## 2017-10-13 DIAGNOSIS — I739 Peripheral vascular disease, unspecified: Secondary | ICD-10-CM

## 2017-10-13 DIAGNOSIS — I10 Essential (primary) hypertension: Secondary | ICD-10-CM

## 2017-10-13 DIAGNOSIS — M79606 Pain in leg, unspecified: Secondary | ICD-10-CM

## 2017-10-13 DIAGNOSIS — R0609 Other forms of dyspnea: Secondary | ICD-10-CM | POA: Diagnosis not present

## 2017-10-13 DIAGNOSIS — R0602 Shortness of breath: Secondary | ICD-10-CM

## 2017-10-13 DIAGNOSIS — I679 Cerebrovascular disease, unspecified: Secondary | ICD-10-CM | POA: Diagnosis not present

## 2017-10-13 DIAGNOSIS — R0600 Dyspnea, unspecified: Secondary | ICD-10-CM

## 2017-10-13 MED ORDER — METOPROLOL TARTRATE 50 MG PO TABS: 50.0000 mg | ORAL_TABLET | Freq: Once | ORAL | 0 refills | Status: DC

## 2017-10-13 NOTE — Progress Notes (Signed)
Cardiology Office Note:    Date:  10/13/2017   ID:  BRIELL Rios, DOB 08-02-1945, MRN [id number]  PCP:  [name], MD  Cardiologist:  Ena Dawley, MD   Referring MD: [name], MD   Chief complain: Shortness of breath, uncontrolled hypertension, hyperlipidemia  History of Present Illness:    [name] is a 72 y.o. female with a hx of difficult to control hypertension, hyperlipidemia, who is coming for concern of worsening shortness of breath on exertion, she has been having difficulties controlling her blood pressure, now managed well by Dr. Quay Burow, she was previously on amlodipine that caused lower extremity edema, her blood pressure could be as high as 200, also any stool low she gets dizzy and she was experiencing falls. She states that since his blood pressure has been better controlled her shortness of breath has improved but is still persistent.  She can only walk about quarter mile before she has to stop.  She has also developed unusual redness in her toes, denies any claudications.  She has recently underwent brain MRI that showed progressive microvascular disease.  Past Medical History:  Diagnosis Date  . ALLERGIC RHINITIS   . ANEMIA-NOS   . ASTHMA   . Carpal tunnel syndrome   . COLONIC POLYPS, HX OF   . DIABETES MELLITUS, TYPE II    diet controlled  . GERD   . HYPERLIPIDEMIA   . HYPERTENSION   . OSTEOPENIA   . Psoriasis    severe, began soriatane 01/2012  . Rectal fissure   . Scoliosis     Past Surgical History:  Procedure Laterality Date  . benign rectal growth  2004   removed by Dr. Zella Richer  . BREAST BIOPSY Right   . BREAST EXCISIONAL BIOPSY Left   . BREAST SURGERY  1988   biopsy  . CESAREAN SECTION      Current Medications: Current Meds  Medication Sig  . blood glucose meter kit and supplies KIT Dispense based on patient and insurance preference. ONE TOUCH ULTRA  . Cholecalciferol (VITAMIN D3) 2000 units TABS Take  2,000 Units by mouth at bedtime.   . clindamycin (CLEOCIN T) 1 % external solution Apply 1 application topically as needed.  . clobetasol (TEMOVATE) 0.05 % external solution Apply 1 application topically as needed.  . Coenzyme Q10 (COQ10) 200 MG CAPS Take 1 capsule by mouth at bedtime. Gummies  . doxylamine, Sleep, (UNISOM) 25 MG tablet Take 12.5 mg by mouth at bedtime as needed. Take 1/2 a tablet (12.5 mg) by mouth daily at bedtime as needed for insomnia.  Marland Kitchen ENBREL SURECLICK 50 MG/ML injection Inject into the skin once a week. Once a week sometimes on sundays or mondays. Patient unsure of dose  . Flurandrenolide (CORDRAN) 4 MCG/SQCM TAPE Apply 4 each topically as needed.  Marland Kitchen glucose blood (ONE TOUCH ULTRA TEST) test strip Use test strip to check blood sugar at least 3 times  . halobetasol (ULTRAVATE) 0.05 % cream Apply 1 application topically daily.  Marland Kitchen ibuprofen (ADVIL,MOTRIN) 200 MG tablet Take 200 mg by mouth every 6 (six) hours as needed for mild pain.  . Lancets (ONETOUCH ULTRASOFT) lancets Use lancet to test blood sugar at least twice a day or as directed by your physician.  . loratadine (CLARITIN REDITABS) 10 MG dissolvable tablet Take 10 mg by mouth every other day as needed for allergies.  . Multiple Vitamin (MULTIVITAMIN) tablet Take 1 tablet by mouth at bedtime.   Marland Kitchen  nebivolol (BYSTOLIC) 2.5 MG tablet Take 1 tablet (2.5 mg total) by mouth daily.  Marland Kitchen olmesartan (BENICAR) 20 MG tablet Take 1 tablet (20 mg total) by mouth 2 (two) times daily.  Marland Kitchen omeprazole (PRILOSEC) 20 MG capsule Take 20 mg by mouth daily. OTC  . oxymetazoline (AFRIN 12 HOUR) 0.05 % nasal spray Place 1 spray into the nose 2 (two) times daily.   . Pitavastatin Calcium 1 MG TABS Take 1 tablet (1 mg total) by mouth daily. (Patient taking differently: Take 1 mg by mouth every other day. )     Allergies:   Amlodipine; Clarithromycin; Codeine; Erythromycin; Statins; and Tetracycline   Social History   Socioeconomic History    . Marital status: Single    Spouse name: Not on file  . Number of children: Not on file  . Years of education: Not on file  . Highest education level: Not on file  Occupational History  . Not on file  Social Needs  . Financial resource strain: Not on file  . Food insecurity:    Worry: Not on file    Inability: Not on file  . Transportation needs:    Medical: Not on file    Non-medical: Not on file  Tobacco Use  . Smoking status: Former Smoker    Last attempt to quit: 02/14/1974    Years since quitting: 43.6  . Smokeless tobacco: Never Used  Substance and Sexual Activity  . Alcohol use: No  . Drug use: No  . Sexual activity: Not on file  Lifestyle  . Physical activity:    Days per week: Not on file    Minutes per session: Not on file  . Stress: Not on file  Relationships  . Social connections:    Talks on phone: Not on file    Gets together: Not on file    Attends religious service: Not on file    Active member of club or organization: Not on file    Attends meetings of clubs or organizations: Not on file    Relationship status: Not on file  Other Topics Concern  . Not on file  Social History Narrative  . Not on file     Family History: The patient's family history includes Arthritis in her other; Asthma in her other; Breast cancer in her other; Breast cancer (age of onset: 12) in her maternal aunt; Heart disease in her other; Hypertension in her other; Lung cancer in her father.  ROS:   Please see the history of present illness.    All other systems reviewed and are negative.  EKGs/Labs/Other Studies Reviewed:    The following studies were reviewed today:  EKG:  EKG is not ordered today.   Recent Labs: 05/01/2017: TSH 3.01 10/06/2017: ALT 24; BUN 13; Creatinine, Ser 0.98; Hemoglobin 13.5; Platelets 283; Potassium 3.9; Sodium 142  Recent Lipid Panel    Component Value Date/Time   CHOL 228 (H) 05/01/2017 0901   TRIG 211.0 (H) 05/01/2017 0901   HDL 54.40  05/01/2017 0901   CHOLHDL 4 05/01/2017 0901   VLDL 42.2 (H) 05/01/2017 0901   LDLCALC 104 (H) 10/31/2016 0933   LDLDIRECT 140.0 05/01/2017 0901    Physical Exam:    VS:  BP 120/80   Pulse 75   Ht 5' 1.2" (1.554 m)   Wt 138 lb 3.2 oz (62.7 kg)   SpO2 97%   BMI 25.94 kg/m     Wt Readings from Last 3 Encounters:  10/13/17 138  lb 3.2 oz (62.7 kg)  07/31/17 142 lb (64.4 kg)  07/03/17 143 lb (64.9 kg)     GEN: Well nourished, well developed in no acute distress HEENT: Normal NECK: No JVD; No carotid bruits LYMPHATICS: No lymphadenopathy CARDIAC: RRR, no murmurs, rubs, gallops RESPIRATORY:  Clear to auscultation without rales, wheezing or rhonchi  ABDOMEN: Soft, non-tender, non-distended MUSCULOSKELETAL: Minimal bilateral edema around her ankles, significant erythema in bilateral toes but good pulses.   SKIN: Warm and dry NEUROLOGIC:  Alert and oriented x 3 PSYCHIATRIC:  Normal affect   ASSESSMENT:    1. Small vessel disease, cerebrovascular   2. SOB (shortness of breath)   3. DOE (dyspnea on exertion)   4. Erythema of lower extremity   5. Essential hypertension    PLAN:    In order of problems listed above:  1.  Shortness of breath -with significant risk factors including hypertension, hyperlipidemia, microvascular disease of her brain she has normal LVEF and grade 1 diastolic dysfunction on echo from 2017, EKG from 10/07/2017 showed only nonspecific ST-T wave abnormalities.  She is already on statin, I would continue.  We will obtain a coronary CTA to further evaluate.  2.  Hypertension -I agree with management that Dr. Quay Burow started I would continue.  3.  Hyperlipidemia -tolerating pivastatin  4.  Lower extremity erythema, I will obtain bilateral lower extremity arterial duplex  Medication Adjustments/Labs and Tests Ordered: Current medicines are reviewed at length with the patient today.  Concerns regarding medicines are outlined above.  Orders Placed This  Encounter  Procedures  . CT CORONARY MORPH W/CTA COR W/SCORE W/CA W/CM &/OR WO/CM  . CT CORONARY FRACTIONAL FLOW RESERVE DATA PREP  . CT CORONARY FRACTIONAL FLOW RESERVE FLUID ANALYSIS  . Basic Metabolic Panel (BMET)  . Ambulatory referral to Neurology   Meds ordered this encounter  Medications  . metoprolol tartrate (LOPRESSOR) 50 MG tablet    Sig: Take 1 tablet (50 mg total) by mouth once for 1 dose. Take 1 hour prior to your Coronary CT.    Dispense:  1 tablet    Refill:  0    Patient Instructions  Medication Instructions:   Your physician recommends that you continue on your current medications as directed. Please refer to the Current Medication list given to you today.   Labwork:  BMET--PRIOR TO WHEN YOU SCHEDULE HER CORONARY CT    Testing/Procedures:  Your physician has requested that you have a lower extremity arterial duplex. This test is an ultrasound of the arteries in the legs or arms. It looks at arterial blood flow in the legs and arms. Allow one hour for Lower and Upper Arterial scans. There are no restrictions or special instructions   CORONARY CT Please arrive at the [facility] main entrance of [facility] at xx:xx AM (30-45 minutes prior to test start time)  [facility] [place], [place] [address] [phone]  Proceed to the [facility] Radiology Department (First Floor).  Please follow these instructions carefully (unless otherwise directed):   On the Night Before the Test: . Drink plenty of water. . Do not consume any caffeinated/decaffeinated beverages or chocolate 12 hours prior to your test. . Do not take any antihistamines 12 hours prior to your test.   On the Day of the Test: . Drink plenty of water. Do not drink any water within one hour of the test. . Do not eat any food 4 hours prior to the test. .  You may take your regular medications prior to the test. . IF NOT ON A BETA BLOCKER - Take 50 mg of  lopressor (metoprolol) one hour before the test.   After the Test: . Drink plenty of water. . After receiving IV contrast, you may experience a mild flushed feeling. This is normal. . On occasion, you may experience a mild rash up to 24 hours after the test. This is not dangerous. If this occurs, you can take Benadryl 25 mg and increase your fluid intake. . If you experience trouble breathing, this can be serious. If it is severe call 911 IMMEDIATELY. If it is mild, please call our office.     You have been referred to DR. DONIKA PATEL WITH NEUROLOGY FOR NEW PATIENT CONSULT FOR YOUR MICROVASCULAR DISEAES      Follow-Up:  4 MONTHS WITH AN EXTENDER ON DR Langley Ingalls'S TEAM       If you need a refill on your cardiac medications before your next appointment, please call your pharmacy.      Signed, Ena Dawley, MD  10/13/2017 10:44 AM    [place]

## 2017-10-13 NOTE — Patient Instructions (Signed)
Medication Instructions:   Your physician recommends that you continue on your current medications as directed. Please refer to the Current Medication list given to you today.   Labwork:  BMET--PRIOR TO WHEN YOU SCHEDULE HER CORONARY CT    Testing/Procedures:  Your physician has requested that you have a lower extremity arterial duplex. This test is an ultrasound of the arteries in the legs or arms. It looks at arterial blood flow in the legs and arms. Allow one hour for Lower and Upper Arterial scans. There are no restrictions or special instructions   CORONARY CT Please arrive at the [facility] main entrance of [facility] at xx:xx AM (30-45 minutes prior to test start time)  [facility] [place], [place] [address] [phone]  Proceed to the [facility] Radiology Department (First Floor).  Please follow these instructions carefully (unless otherwise directed):   On the Night Before the Test: . Drink plenty of water. . Do not consume any caffeinated/decaffeinated beverages or chocolate 12 hours prior to your test. . Do not take any antihistamines 12 hours prior to your test.   On the Day of the Test: . Drink plenty of water. Do not drink any water within one hour of the test. . Do not eat any food 4 hours prior to the test. . You may take your regular medications prior to the test. . IF NOT ON A BETA BLOCKER - Take 50 mg of lopressor (metoprolol) one hour before the test.   After the Test: . Drink plenty of water. . After receiving IV contrast, you may experience a mild flushed feeling. This is normal. . On occasion, you may experience a mild rash up to 24 hours after the test. This is not dangerous. If this occurs, you can take Benadryl 25 mg and increase your fluid intake. . If you experience trouble breathing, this can be serious. If it is severe call 911 IMMEDIATELY. If it is mild, please call our office.     You have been  referred to DR. DONIKA PATEL WITH NEUROLOGY FOR NEW PATIENT CONSULT FOR YOUR MICROVASCULAR DISEAES      Follow-Up:  4 MONTHS WITH AN EXTENDER ON DR NELSON'S TEAM       If you need a refill on your cardiac medications before your next appointment, please call your pharmacy.

## 2017-10-17 ENCOUNTER — Ambulatory Visit (HOSPITAL_COMMUNITY)
Admission: RE | Admit: 2017-10-17 | Discharge: 2017-10-17 | Disposition: A | Discharge status: Home / Self Care | Payer: Medicare Other | Source: Ambulatory Visit | Attending: Cardiology | Admitting: Cardiology

## 2017-10-17 DIAGNOSIS — M79606 Pain in leg, unspecified: Secondary | ICD-10-CM | POA: Diagnosis not present

## 2017-10-17 DIAGNOSIS — I739 Peripheral vascular disease, unspecified: Secondary | ICD-10-CM | POA: Insufficient documentation

## 2017-10-17 DIAGNOSIS — L539 Erythematous condition, unspecified: Secondary | ICD-10-CM | POA: Diagnosis present

## 2017-10-29 NOTE — Patient Instructions (Addendum)
  Test(s) ordered today. Your results will be released to MyChart (or called to you) after review, usually within 72hours after test completion. If any changes need to be made, you will be notified at that same time.  Flu immunization administered today.    Medications reviewed and updated.  No changes recommended at this time.    Please followup in 6months   

## 2017-10-29 NOTE — Progress Notes (Signed)
Subjective:    Patient ID: [name], female    DOB: 11/02/1945, 72 y.o.   MRN: [id number]  HPI The patient is here for follow up.  Hypertension: She is taking her medication daily. She is compliant with a low sodium diet.  She denies chest pain, palpitations, shortness of breath. She is exercising - doing some walking.  She does monitor her blood pressure at home - 114/81-128/85.    Hyperlipidemia: She is taking her medication daily. She is compliant with a low fat/cholesterol diet. She is exercising regularly - walking some. She denies myalgias.   Diabetes: She is controlling her sugars with diet. She is compliant with a diabetic diet. She is exercising regularly.  She checks her feet daily and denies foot lesions. She is up-to-date with an ophthalmology examination.   GERD:  She is taking her medication daily as prescribed.  She has had increased GERD recently and is not sure why.     Dizziness: Last month she was at the beach and had a wave of dizziness.  Since then she has had intermittent dizziness of feeling off balanced.  She was walking on a trail and fell hard.  She has had more episodes of dizziness and has had headaches every other day.  Cardiology did refer her to neurology, but she is unsure if she wants to see a different neurologist.  She is concerned about her risk of stroke and dementia.  Medications and allergies reviewed with patient and updated if appropriate.  Patient Active Problem List   Diagnosis Date Noted  . Small vessel disease, cerebrovascular 07/31/2017  . Atypical chest pain 06/14/2017  . Dizziness 06/14/2017  . Easy bruising 05/30/2017  . Hyperuricemia 05/03/2017  . Arthralgia 05/01/2017  . Hair loss 05/01/2017  . Vitamin D deficiency 10/31/2016  . Redness of skin, feet 10/31/2016  . Diastolic dysfunction 40/98/1191  . Vertigo 12/24/2015  . Fatigue 12/24/2015  . Nonspecific abnormal electrocardiogram (ECG) (EKG) 12/24/2015  . Bilateral  carotid artery disease, Mild 05/31/2015  . Thyroid nodule 05/31/2015  . Psoriasis   . Scoliosis   . Diabetes type 2, controlled ([place]) 06/03/2008  . Dyslipidemia 06/03/2008  . CARPAL TUNNEL SYNDROME 06/03/2008  . Essential hypertension 06/03/2008  . ALLERGIC RHINITIS 06/03/2008  . Asthma 06/03/2008  . GERD 06/03/2008  . Osteopenia 06/03/2008  . COLONIC POLYPS, HX OF 06/03/2008    Current Outpatient Medications on File Prior to Visit  Medication Sig Dispense Refill  . blood glucose meter kit and supplies KIT Dispense based on patient and insurance preference. ONE TOUCH ULTRA 1 each 0  . Cholecalciferol (VITAMIN D3) 2000 units TABS Take 2,000 Units by mouth at bedtime.     . clindamycin (CLEOCIN T) 1 % external solution Apply 1 application topically as needed.    . clobetasol (TEMOVATE) 0.05 % external solution Apply 1 application topically as needed.    . Coenzyme Q10 (COQ10) 200 MG CAPS Take 1 capsule by mouth at bedtime. Gummies    . doxylamine, Sleep, (UNISOM) 25 MG tablet Take 12.5 mg by mouth at bedtime as needed. Take 1/2 a tablet (12.5 mg) by mouth daily at bedtime as needed for insomnia.    Marland Kitchen ENBREL SURECLICK 50 MG/ML injection Inject into the skin once a week. Once a week sometimes on sundays or mondays. Patient unsure of dose    . Flurandrenolide (CORDRAN) 4 MCG/SQCM TAPE Apply 4 each topically as needed.    Marland Kitchen glucose blood (ONE TOUCH ULTRA  TEST) test strip Use test strip to check blood sugar at least 3 times 300 each 1  . halobetasol (ULTRAVATE) 0.05 % cream Apply 1 application topically daily.    Marland Kitchen ibuprofen (ADVIL,MOTRIN) 200 MG tablet Take 200 mg by mouth every 6 (six) hours as needed for mild pain.    . Lancets (ONETOUCH ULTRASOFT) lancets Use lancet to test blood sugar at least twice a day or as directed by your physician. 200 each 11  . loratadine (CLARITIN REDITABS) 10 MG dissolvable tablet Take 10 mg by mouth every other day as needed for allergies.    . Multiple  Vitamin (MULTIVITAMIN) tablet Take 1 tablet by mouth at bedtime.     . nebivolol (BYSTOLIC) 2.5 MG tablet Take 1 tablet (2.5 mg total) by mouth daily. 30 tablet 5  . olmesartan (BENICAR) 20 MG tablet Take 1 tablet (20 mg total) by mouth 2 (two) times daily. 180 tablet 1  . omeprazole (PRILOSEC) 20 MG capsule Take 20 mg by mouth daily. OTC    . oxymetazoline (AFRIN 12 HOUR) 0.05 % nasal spray Place 1 spray into the nose 2 (two) times daily.     . Pitavastatin Calcium 1 MG TABS Take 1 tablet (1 mg total) by mouth daily. (Patient taking differently: Take 1 mg by mouth every other day. ) 30 tablet 5  . metoprolol tartrate (LOPRESSOR) 50 MG tablet Take 1 tablet (50 mg total) by mouth once for 1 dose. Take 1 hour prior to your Coronary CT. 1 tablet 0   No current facility-administered medications on file prior to visit.     Past Medical History:  Diagnosis Date  . ALLERGIC RHINITIS   . ANEMIA-NOS   . ASTHMA   . Carpal tunnel syndrome   . COLONIC POLYPS, HX OF   . DIABETES MELLITUS, TYPE II    diet controlled  . GERD   . HYPERLIPIDEMIA   . HYPERTENSION   . OSTEOPENIA   . Psoriasis    severe, began soriatane 01/2012  . Rectal fissure   . Scoliosis     Past Surgical History:  Procedure Laterality Date  . benign rectal growth  2004   removed by Dr. Zella Richer  . BREAST BIOPSY Right   . BREAST EXCISIONAL BIOPSY Left   . BREAST SURGERY  1988   biopsy  . CESAREAN SECTION      Social History   Socioeconomic History  . Marital status: Single    Spouse name: Not on file  . Number of children: Not on file  . Years of education: Not on file  . Highest education level: Not on file  Occupational History  . Not on file  Social Needs  . Financial resource strain: Not on file  . Food insecurity:    Worry: Not on file    Inability: Not on file  . Transportation needs:    Medical: Not on file    Non-medical: Not on file  Tobacco Use  . Smoking status: Former Smoker    Last attempt  to quit: 02/14/1974    Years since quitting: 43.7  . Smokeless tobacco: Never Used  Substance and Sexual Activity  . Alcohol use: No  . Drug use: No  . Sexual activity: Not on file  Lifestyle  . Physical activity:    Days per week: Not on file    Minutes per session: Not on file  . Stress: Not on file  Relationships  . Social connections:    Talks  on phone: Not on file    Gets together: Not on file    Attends religious service: Not on file    Active member of club or organization: Not on file    Attends meetings of clubs or organizations: Not on file    Relationship status: Not on file  Other Topics Concern  . Not on file  Social History Narrative  . Not on file    Family History  Problem Relation Age of Onset  . Lung cancer Father   . Arthritis Other        Parents  . Asthma Other        parent, other relative  . Breast cancer Other        other relative  . Hypertension Other        parent, other relative  . Heart disease Other        parent, other relative  . Breast cancer Maternal Aunt 68    Review of Systems  Constitutional: Negative for chills and fever.  Eyes: Negative for visual disturbance.  Respiratory: Positive for cough. Negative for shortness of breath (has not been walking as much) and wheezing.   Cardiovascular: Positive for leg swelling (left ankle). Negative for chest pain and palpitations.  Neurological: Positive for dizziness (not daily) and headaches (left side of head).       Objective:   Vitals:   10/30/17 0825  BP: (!) 188/108  Pulse: 64  Resp: 16  Temp: 97.9 F (36.6 C)  SpO2: 95%   BP Readings from Last 3 Encounters:  10/30/17 (!) 188/108  10/13/17 120/80  10/06/17 (!) 148/81   Wt Readings from Last 3 Encounters:  10/30/17 137 lb (62.1 kg)  10/13/17 138 lb 3.2 oz (62.7 kg)  07/31/17 142 lb (64.4 kg)   Body mass index is 25.72 kg/m.   Physical Exam    Constitutional: Appears well-developed and well-nourished. No  distress.  HENT:  Head: Normocephalic and atraumatic.  Neck: Neck supple. No tracheal deviation present. No thyromegaly present.  No cervical lymphadenopathy Cardiovascular: Normal rate, regular rhythm and normal heart sounds.   No murmur heard. No carotid bruit .  No edema Pulmonary/Chest: Effort normal and breath sounds normal. No respiratory distress. No has no wheezes. No rales.  Skin: Skin is warm and dry. Not diaphoretic.  Psychiatric: Normal mood and affect. Behavior is normal.      Assessment & Plan:    See Problem List for Assessment and Plan of chronic medical problems.

## 2017-10-30 ENCOUNTER — Encounter: Payer: Self-pay | Admitting: Internal Medicine

## 2017-10-30 ENCOUNTER — Ambulatory Visit (INDEPENDENT_AMBULATORY_CARE_PROVIDER_SITE_OTHER): Payer: Medicare Other | Admitting: Internal Medicine

## 2017-10-30 ENCOUNTER — Other Ambulatory Visit (INDEPENDENT_AMBULATORY_CARE_PROVIDER_SITE_OTHER): Payer: Medicare Other

## 2017-10-30 VITALS — BP 188/108 | HR 64 | Temp 97.9°F | Resp 16 | Ht 61.2 in | Wt 137.0 lb

## 2017-10-30 DIAGNOSIS — E1165 Type 2 diabetes mellitus with hyperglycemia: Secondary | ICD-10-CM | POA: Diagnosis not present

## 2017-10-30 DIAGNOSIS — I10 Essential (primary) hypertension: Secondary | ICD-10-CM | POA: Diagnosis not present

## 2017-10-30 DIAGNOSIS — Z23 Encounter for immunization: Secondary | ICD-10-CM | POA: Diagnosis not present

## 2017-10-30 DIAGNOSIS — K219 Gastro-esophageal reflux disease without esophagitis: Secondary | ICD-10-CM

## 2017-10-30 DIAGNOSIS — E785 Hyperlipidemia, unspecified: Secondary | ICD-10-CM

## 2017-10-30 LAB — COMPREHENSIVE METABOLIC PANEL
ALT: 20 U/L (ref 0–35)
AST: 16 U/L (ref 0–37)
Albumin: 4.3 g/dL (ref 3.5–5.2)
Alkaline Phosphatase: 74 U/L (ref 39–117)
BUN: 16 mg/dL (ref 6–23)
CO2: 28 mEq/L (ref 19–32)
Calcium: 10.2 mg/dL (ref 8.4–10.5)
Chloride: 101 mEq/L (ref 96–112)
Creatinine, Ser: 0.88 mg/dL (ref 0.40–1.20)
GFR: 66.76 mL/min (ref >60.00)
Glucose, Bld: 110 mg/dL — ABNORMAL HIGH (ref 70–99)
Potassium: 4.1 mEq/L (ref 3.5–5.1)
Sodium: 139 mEq/L (ref 135–145)
Total Bilirubin: 0.6 mg/dL (ref 0.2–1.2)
Total Protein: 8 g/dL (ref 6.0–8.3)

## 2017-10-30 LAB — LIPID PANEL
Cholesterol: 210 mg/dL — ABNORMAL HIGH (ref 0–200)
HDL: 48.3 mg/dL (ref >39.00)
NonHDL: 161.29
Total CHOL/HDL Ratio: 4
Triglycerides: 213 mg/dL — ABNORMAL HIGH (ref 0.0–149.0)
VLDL: 42.6 mg/dL — ABNORMAL HIGH (ref 0.0–40.0)

## 2017-10-30 LAB — CBC WITH DIFFERENTIAL/PLATELET
Basophils Absolute: 0 10*3/uL (ref 0.0–0.1)
Basophils Relative: 0.3 % (ref 0.0–3.0)
Eosinophils Absolute: 0.3 10*3/uL (ref 0.0–0.7)
Eosinophils Relative: 4.5 % (ref 0.0–5.0)
HCT: 40.6 % (ref 36.0–46.0)
Hemoglobin: 13.7 g/dL (ref 12.0–15.0)
Lymphocytes Relative: 31.6 % (ref 12.0–46.0)
Lymphs Abs: 2.2 10*3/uL (ref 0.7–4.0)
MCHC: 33.8 g/dL (ref 30.0–36.0)
MCV: 92.4 fl (ref 78.0–100.0)
Monocytes Absolute: 0.5 10*3/uL (ref 0.1–1.0)
Monocytes Relative: 7.1 % (ref 3.0–12.0)
Neutro Abs: 3.9 10*3/uL (ref 1.4–7.7)
Neutrophils Relative %: 56.5 % (ref 43.0–77.0)
Platelets: 340 10*3/uL (ref 150.0–400.0)
RBC: 4.39 Mil/uL (ref 3.87–5.11)
RDW: 13.7 % (ref 11.5–15.5)
WBC: 6.8 10*3/uL (ref 4.0–10.5)

## 2017-10-30 LAB — LDL CHOLESTEROL, DIRECT: Direct LDL: 131 mg/dL

## 2017-10-30 LAB — HEMOGLOBIN A1C: Hgb A1c MFr Bld: 6.3 % (ref 4.6–6.5)

## 2017-10-30 NOTE — Assessment & Plan Note (Signed)
BP well controlled Current regimen effective and well tolerated Continue current medications at current doses cmp  

## 2017-10-30 NOTE — Assessment & Plan Note (Signed)
Tolerating pitavastatin Check lipids, cmp Increase exercise

## 2017-10-30 NOTE — Assessment & Plan Note (Signed)
GERD controlled Continue daily medication  

## 2017-10-30 NOTE — Assessment & Plan Note (Signed)
Diet controlled Continue regular exercise Check a1c

## 2017-11-01 ENCOUNTER — Encounter: Payer: Self-pay | Admitting: Internal Medicine

## 2017-11-28 ENCOUNTER — Other Ambulatory Visit: Payer: Medicare Other

## 2017-11-28 DIAGNOSIS — I679 Cerebrovascular disease, unspecified: Secondary | ICD-10-CM

## 2017-11-28 DIAGNOSIS — R0602 Shortness of breath: Secondary | ICD-10-CM

## 2017-11-28 DIAGNOSIS — L539 Erythematous condition, unspecified: Secondary | ICD-10-CM

## 2017-11-28 DIAGNOSIS — R0609 Other forms of dyspnea: Secondary | ICD-10-CM

## 2017-11-28 DIAGNOSIS — R0600 Dyspnea, unspecified: Secondary | ICD-10-CM

## 2017-11-28 DIAGNOSIS — I10 Essential (primary) hypertension: Secondary | ICD-10-CM

## 2017-11-28 LAB — BASIC METABOLIC PANEL
BUN/Creatinine Ratio: 17 (ref 12–28)
BUN: 16 mg/dL (ref 8–27)
CO2: 24 mmol/L (ref 20–29)
Calcium: 10.1 mg/dL (ref 8.7–10.3)
Chloride: 100 mmol/L (ref 96–106)
Creatinine, Ser: 0.93 mg/dL (ref 0.57–1.00)
GFR calc Af Amer: 70 mL/min/{1.73_m2} (ref >59)
GFR calc non Af Amer: 61 mL/min/{1.73_m2} (ref >59)
Glucose: 105 mg/dL — ABNORMAL HIGH (ref 65–99)
Potassium: 4.4 mmol/L (ref 3.5–5.2)
Sodium: 141 mmol/L (ref 134–144)

## 2017-12-04 ENCOUNTER — Other Ambulatory Visit: Payer: Self-pay | Admitting: Internal Medicine

## 2017-12-05 ENCOUNTER — Telehealth: Payer: Self-pay | Admitting: Cardiology

## 2017-12-05 ENCOUNTER — Telehealth: Payer: Self-pay | Admitting: *Deleted

## 2017-12-05 ENCOUNTER — Ambulatory Visit (HOSPITAL_COMMUNITY)
Admission: RE | Admit: 2017-12-05 | Discharge: 2017-12-05 | Disposition: A | Discharge status: Home / Self Care | Payer: Medicare Other | Source: Ambulatory Visit | Attending: Cardiology | Admitting: Cardiology

## 2017-12-05 ENCOUNTER — Ambulatory Visit (HOSPITAL_COMMUNITY): Admission: RE | Admit: 2017-12-05 | Payer: Medicare Other | Source: Ambulatory Visit

## 2017-12-05 DIAGNOSIS — I2510 Atherosclerotic heart disease of native coronary artery without angina pectoris: Secondary | ICD-10-CM | POA: Diagnosis not present

## 2017-12-05 DIAGNOSIS — R911 Solitary pulmonary nodule: Secondary | ICD-10-CM

## 2017-12-05 DIAGNOSIS — I679 Cerebrovascular disease, unspecified: Secondary | ICD-10-CM | POA: Diagnosis not present

## 2017-12-05 DIAGNOSIS — R0602 Shortness of breath: Secondary | ICD-10-CM

## 2017-12-05 DIAGNOSIS — I10 Essential (primary) hypertension: Secondary | ICD-10-CM

## 2017-12-05 DIAGNOSIS — R0609 Other forms of dyspnea: Secondary | ICD-10-CM | POA: Diagnosis not present

## 2017-12-05 DIAGNOSIS — R918 Other nonspecific abnormal finding of lung field: Secondary | ICD-10-CM

## 2017-12-05 DIAGNOSIS — L539 Erythematous condition, unspecified: Secondary | ICD-10-CM | POA: Insufficient documentation

## 2017-12-05 DIAGNOSIS — R0600 Dyspnea, unspecified: Secondary | ICD-10-CM

## 2017-12-05 MED ORDER — NITROGLYCERIN 0.4 MG SL SUBL
SUBLINGUAL_TABLET | SUBLINGUAL | Status: AC
  Filled 2017-12-05: qty 1

## 2017-12-05 MED ORDER — IOPAMIDOL (ISOVUE-370) INJECTION 76%
100.0000 mL | Freq: Once | INTRAVENOUS | Status: AC
  Administered 2017-12-05: 80 mL via INTRAVENOUS

## 2017-12-05 NOTE — Telephone Encounter (Signed)
-----   Message from Dorothy Spark, MD sent at 12/05/2017  1:15 PM EDT ----- She has minimal CAD, but a lung mass suspicious for malignancy, she needs to be scheduled for PET CT and referred to oncology. Thank you, [place] Siren

## 2017-12-05 NOTE — Telephone Encounter (Signed)
Follow Up:     Pt said she received a call on her home and cell phone, but nobody left a message. SHe thought it might be about her CT test she had this morning.

## 2017-12-05 NOTE — Telephone Encounter (Signed)
Spoke with the pt and informed her that per Dr [name], her Coronary CT showed that she has minimal CAD, but noted on the CT showed a lung mass suspicious for malignancy, and she recommends that we schedule for her to have PET CT done and be referred to oncology.  Informed the pt that both of these are located at [facility].  Informed the pt that I will place the orders in the system, and have our [facility] schedulers call her back to arrange her PET CT and her referral to Oncology.  Pt verbalized understanding and agrees with this plan.

## 2017-12-05 NOTE — Telephone Encounter (Signed)
Conversation  Data processing manager First)  Me      12/05/17 2:30 PM  Note    Spoke with the pt and informed her that per Dr [name], her Coronary CT showed that she has minimal CAD, but noted on the CT showed a lung mass suspicious for malignancy, and she recommends that we schedule for her to have PET CT done and be referred to oncology.  Informed the pt that both of these are located at [facility].  Informed the pt that I will place the orders in the system, and have our [facility] schedulers call her back to arrange her PET CT and her referral to Oncology.  Pt verbalized understanding and agrees with this plan.     Me       12/05/17 2:30 PM  Note    ----- Message from Dorothy Spark, MD sent at 12/05/2017  1:15 PM EDT ----- She has minimal CAD, but a lung mass suspicious for malignancy, she needs to be scheduled for PET CT and referred to oncology. Thank you, Houston Siren

## 2017-12-06 ENCOUNTER — Encounter: Payer: Self-pay | Admitting: Internal Medicine

## 2017-12-06 ENCOUNTER — Telehealth: Payer: Self-pay | Admitting: Internal Medicine

## 2017-12-06 NOTE — Telephone Encounter (Signed)
New referral received from Dr. [name] for lung mass. Pt has been scheduled to see Dr. Julien Nordmann on 11/5 at 2:15pm and labs at 1:45pm. Pt agreed to the appt date and time. Letter mailed.

## 2017-12-15 ENCOUNTER — Telehealth: Payer: Self-pay | Admitting: Internal Medicine

## 2017-12-15 ENCOUNTER — Ambulatory Visit (HOSPITAL_COMMUNITY): Payer: Medicare Other

## 2017-12-15 NOTE — Telephone Encounter (Signed)
Patient came to scheduling inquiring about PET Scan that was cancelled.  She states she was never informed of this.  I called [facility] Sched[facility] and spoke with [place].  She stated it was cancelled 10/30 and patient was notified via VM that was left on home phone number.  I also called Dr Houston Siren Nelson's office inquiring about PET Scan auth.  They are still waiting for authorization and will notify patient once it has been received.  I sent IB message to Dr Julien Nordmann asking if he wanted to keep NP Appt as is for 11/5 or r/s until after patient has PET Scan.  I explained this to patient and will notify her of Dr Worthy Flank response.

## 2017-12-18 ENCOUNTER — Telehealth: Payer: Self-pay | Admitting: Internal Medicine

## 2017-12-18 NOTE — Telephone Encounter (Signed)
Patient notified regarding appointment with [facility] per 11/1 sch msg

## 2017-12-19 ENCOUNTER — Encounter: Payer: Self-pay | Admitting: Internal Medicine

## 2017-12-19 ENCOUNTER — Telehealth: Payer: Self-pay

## 2017-12-19 ENCOUNTER — Inpatient Hospital Stay: Payer: Medicare Other | Attending: Internal Medicine | Admitting: Internal Medicine

## 2017-12-19 ENCOUNTER — Other Ambulatory Visit: Payer: Self-pay | Admitting: *Deleted

## 2017-12-19 ENCOUNTER — Inpatient Hospital Stay: Payer: Medicare Other

## 2017-12-19 DIAGNOSIS — I2699 Other pulmonary embolism without acute cor pulmonale: Secondary | ICD-10-CM | POA: Diagnosis not present

## 2017-12-19 DIAGNOSIS — E785 Hyperlipidemia, unspecified: Secondary | ICD-10-CM | POA: Diagnosis not present

## 2017-12-19 DIAGNOSIS — R42 Dizziness and giddiness: Secondary | ICD-10-CM | POA: Diagnosis not present

## 2017-12-19 DIAGNOSIS — L409 Psoriasis, unspecified: Secondary | ICD-10-CM

## 2017-12-19 DIAGNOSIS — M419 Scoliosis, unspecified: Secondary | ICD-10-CM | POA: Insufficient documentation

## 2017-12-19 DIAGNOSIS — I10 Essential (primary) hypertension: Secondary | ICD-10-CM | POA: Diagnosis not present

## 2017-12-19 DIAGNOSIS — Z853 Personal history of malignant neoplasm of breast: Secondary | ICD-10-CM

## 2017-12-19 DIAGNOSIS — J45909 Unspecified asthma, uncomplicated: Secondary | ICD-10-CM

## 2017-12-19 DIAGNOSIS — R918 Other nonspecific abnormal finding of lung field: Secondary | ICD-10-CM | POA: Diagnosis not present

## 2017-12-19 DIAGNOSIS — D649 Anemia, unspecified: Secondary | ICD-10-CM | POA: Diagnosis not present

## 2017-12-19 DIAGNOSIS — K219 Gastro-esophageal reflux disease without esophagitis: Secondary | ICD-10-CM | POA: Diagnosis not present

## 2017-12-19 DIAGNOSIS — E119 Type 2 diabetes mellitus without complications: Secondary | ICD-10-CM | POA: Diagnosis not present

## 2017-12-19 DIAGNOSIS — R911 Solitary pulmonary nodule: Secondary | ICD-10-CM

## 2017-12-19 DIAGNOSIS — Z87891 Personal history of nicotine dependence: Secondary | ICD-10-CM

## 2017-12-19 DIAGNOSIS — Z79899 Other long term (current) drug therapy: Secondary | ICD-10-CM

## 2017-12-19 DIAGNOSIS — Z801 Family history of malignant neoplasm of trachea, bronchus and lung: Secondary | ICD-10-CM

## 2017-12-19 DIAGNOSIS — Z803 Family history of malignant neoplasm of breast: Secondary | ICD-10-CM

## 2017-12-19 LAB — CBC WITH DIFFERENTIAL (CANCER CENTER ONLY)
Abs Immature Granulocytes: 0.02 10*3/uL (ref 0.00–0.07)
Basophils Absolute: 0.1 10*3/uL (ref 0.0–0.1)
Basophils Relative: 1 %
Eosinophils Absolute: 0.2 10*3/uL (ref 0.0–0.5)
Eosinophils Relative: 3 %
HCT: 37.7 % (ref 36.0–46.0)
Hemoglobin: 12.1 g/dL (ref 12.0–15.0)
Immature Granulocytes: 0 %
Lymphocytes Relative: 33 %
Lymphs Abs: 2.6 10*3/uL (ref 0.7–4.0)
MCH: 30.9 pg (ref 26.0–34.0)
MCHC: 32.1 g/dL (ref 30.0–36.0)
MCV: 96.2 fL (ref 80.0–100.0)
Monocytes Absolute: 0.6 10*3/uL (ref 0.1–1.0)
Monocytes Relative: 7 %
Neutro Abs: 4.4 10*3/uL (ref 1.7–7.7)
Neutrophils Relative %: 56 %
Platelet Count: 253 10*3/uL (ref 150–400)
RBC: 3.92 MIL/uL (ref 3.87–5.11)
RDW: 14 % (ref 11.5–15.5)
WBC Count: 7.8 10*3/uL (ref 4.0–10.5)
nRBC: 0 % (ref 0.0–0.2)

## 2017-12-19 LAB — CMP (CANCER CENTER ONLY)
ALT: 23 U/L (ref 0–44)
AST: 18 U/L (ref 15–41)
Albumin: 3.7 g/dL (ref 3.5–5.0)
Alkaline Phosphatase: 73 U/L (ref 38–126)
Anion gap: 8 (ref 5–15)
BUN: 16 mg/dL (ref 8–23)
CO2: 26 mmol/L (ref 22–32)
Calcium: 9.8 mg/dL (ref 8.9–10.3)
Chloride: 107 mmol/L (ref 98–111)
Creatinine: 0.96 mg/dL (ref 0.44–1.00)
GFR, Est AFR Am: >60 mL/min (ref >60)
GFR, Estimated: 57 mL/min — ABNORMAL LOW (ref >60)
Glucose, Bld: 106 mg/dL — ABNORMAL HIGH (ref 70–99)
Potassium: 4.1 mmol/L (ref 3.5–5.1)
Sodium: 141 mmol/L (ref 135–145)
Total Bilirubin: 0.5 mg/dL (ref 0.3–1.2)
Total Protein: 7.4 g/dL (ref 6.5–8.1)

## 2017-12-19 NOTE — Telephone Encounter (Signed)
Printed avs and calender of upcoming appointment. Per 11/5 los. Gave CT information. Provider will order appointments after the scan results

## 2017-12-19 NOTE — Progress Notes (Signed)
[place] Telephone:([phone]) [phone]   Fax:([phone]) [phone]  CONSULT NOTE  REFERRING PHYSICIAN: Dr. Billey Gosling  REASON FOR CONSULTATION:  72 years old white female with suspicious lung cancer.  HPI [name] is a 72 y.o. female with past medical history significant for hypertension, diabetes mellitus, GERD, asthma, anemia, dyslipidemia, psoriasis, scoliosis, history of left breast cancer for benign lesion as well as remote history of few years of smoking but quit in 1976.  The patient mentioned that she has been complaining of increasing shortness of breath as well as vertigo recently.  She was referred by her primary care physician to Dr. [name] for cardiac evaluation. She had CT angiogram of the coronaries and heart performed on December 05, 2017 and that showed evidence of aortic atherosclerosis.  There was incidental finding of an elongated mass measuring 3.8 x 1.6 x 1.6 cm in the anterior aspect of the left lower lobe. The patient was referred to me today for further evaluation and recommendation regarding her condition.  She was supposed to have a PET scan performed but for some reason it was denied by her insurance. When seen today she is feeling fine except for fatigue in addition to intermittent back and hip pain.  She denied having any significant chest pain, shortness of breath but has mild cough with no hemoptysis.  She denied having any headache or visual changes.  The patient denied having any significant weight loss or night sweats. Family history significant for father died from lung cancer at age 92, maternal aunt had breast cancer in her 21s, mother died from congestive heart failure at age 47 and brother had prostate cancer. The patient is single and has 1 daughter.  She is a retired Education officer, [employer].  She was accompanied by a friend named Paediatric nurse.  She has remote history of few years of his smoking for around 5 years and quit in 1976.  She has no history of  alcohol or drug abuse.  HPI  Past Medical History:  Diagnosis Date  . ALLERGIC RHINITIS   . ANEMIA-NOS   . ASTHMA   . Carpal tunnel syndrome   . COLONIC POLYPS, HX OF   . DIABETES MELLITUS, TYPE II    diet controlled  . GERD   . HYPERLIPIDEMIA   . HYPERTENSION   . OSTEOPENIA   . Psoriasis    severe, began soriatane 01/2012  . Rectal fissure   . Scoliosis     Past Surgical History:  Procedure Laterality Date  . benign rectal growth  2004   removed by Dr. Zella Richer  . BREAST BIOPSY Right   . BREAST EXCISIONAL BIOPSY Left   . BREAST SURGERY  1988   biopsy  . CESAREAN SECTION      Family History  Problem Relation Age of Onset  . Lung cancer Father   . Arthritis Other        Parents  . Asthma Other        parent, other relative  . Breast cancer Other        other relative  . Hypertension Other        parent, other relative  . Heart disease Other        parent, other relative  . Breast cancer Maternal Aunt 68    Social History Social History   Tobacco Use  . Smoking status: Former Smoker    Last attempt to quit: 02/14/1974    Years since quitting: 43.8  .  Smokeless tobacco: Never Used  Substance Use Topics  . Alcohol use: No  . Drug use: No    Allergies  Allergen Reactions  . Amlodipine     swelling  . Clarithromycin     Nausea, I can pass out   . Codeine     Nausea, I can pass out   . Erythromycin     Other reaction(s): Vomiting (intolerance)  . Statins     Muscle pain   . Tetracycline     Current Outpatient Medications  Medication Sig Dispense Refill  . BENICAR 20 MG tablet TAKE ONE (1) TABLET BY MOUTH TWO (2) TIMES DAILY 180 tablet 1  . blood glucose meter kit and supplies KIT Dispense based on patient and insurance preference. ONE TOUCH ULTRA 1 each 0  . BYSTOLIC 2.5 MG tablet TAKE ONE (1) TABLET BY MOUTH EVERY DAY 30 tablet 5  . Cholecalciferol (VITAMIN D3) 2000 units TABS Take 2,000 Units by mouth at bedtime.     . clindamycin  (CLEOCIN T) 1 % external solution Apply 1 application topically as needed.    . clobetasol (TEMOVATE) 0.05 % external solution Apply 1 application topically as needed.    . Coenzyme Q10 (COQ10) 200 MG CAPS Take 1 capsule by mouth at bedtime. Gummies    . doxylamine, Sleep, (UNISOM) 25 MG tablet Take 12.5 mg by mouth at bedtime as needed. Take 1/2 a tablet (12.5 mg) by mouth daily at bedtime as needed for insomnia.    Marland Kitchen ENBREL SURECLICK 50 MG/ML injection Inject into the skin once a week. Once a week sometimes on sundays or mondays. Patient unsure of dose    . Flurandrenolide (CORDRAN) 4 MCG/SQCM TAPE Apply 4 each topically as needed.    Marland Kitchen glucose blood (ONE TOUCH ULTRA TEST) test strip Use test strip to check blood sugar at least 3 times 300 each 1  . halobetasol (ULTRAVATE) 0.05 % cream Apply 1 application topically daily.    Marland Kitchen ibuprofen (ADVIL,MOTRIN) 200 MG tablet Take 200 mg by mouth every 6 (six) hours as needed for mild pain.    . Lancets (ONETOUCH ULTRASOFT) lancets Use lancet to test blood sugar at least twice a day or as directed by your physician. 200 each 11  . loratadine (CLARITIN REDITABS) 10 MG dissolvable tablet Take 10 mg by mouth every other day as needed for allergies.    . Multiple Vitamin (MULTIVITAMIN) tablet Take 1 tablet by mouth at bedtime.     Marland Kitchen omeprazole (PRILOSEC) 20 MG capsule Take 20 mg by mouth daily. OTC    . oxymetazoline (AFRIN 12 HOUR) 0.05 % nasal spray Place 1 spray into the nose 2 (two) times daily.     . Pitavastatin Calcium 1 MG TABS Take 1 tablet (1 mg total) by mouth daily. (Patient taking differently: Take 1 mg by mouth every other day. ) 30 tablet 5  . metoprolol tartrate (LOPRESSOR) 50 MG tablet Take 1 tablet (50 mg total) by mouth once for 1 dose. Take 1 hour prior to your Coronary CT. 1 tablet 0  . Pediatric Multiple Vit-C-FA (MULTIVITAMIN ANIMAL SHAPES, WITH CA/FA,) with C & FA chewable tablet Chew by mouth.     No current facility-administered  medications for this visit.     Review of Systems  Constitutional: positive for fatigue Eyes: negative Ears, nose, mouth, throat, and face: negative Respiratory: positive for cough Cardiovascular: negative Gastrointestinal: negative Genitourinary:negative Integument/breast: negative Hematologic/lymphatic: negative Musculoskeletal:positive for arthralgias and back pain Neurological: negative  Behavioral/Psych: negative Endocrine: negative Allergic/Immunologic: negative  Physical Exam  OEH:OZYYQ, healthy, no distress, well nourished, well developed and anxious SKIN: skin color, texture, turgor are normal, no rashes or significant lesions HEAD: Normocephalic, No masses, lesions, tenderness or abnormalities EYES: normal, PERRLA, Conjunctiva are pink and non-injected EARS: External ears normal, Canals clear OROPHARYNX:no exudate, no erythema and lips, buccal mucosa, and tongue normal  NECK: supple, no adenopathy, no JVD LYMPH:  no palpable lymphadenopathy, no hepatosplenomegaly BREAST:not examined LUNGS: clear to auscultation , and palpation HEART: regular rate & rhythm, no murmurs and no gallops ABDOMEN:abdomen soft, non-tender, normal bowel sounds and no masses or organomegaly BACK: No CVA tenderness, Range of motion is normal EXTREMITIES:no joint deformities, effusion, or inflammation, no edema  NEURO: alert & oriented x 3 with fluent speech, no focal motor/sensory deficits  PERFORMANCE STATUS: ECOG 1  LABORATORY DATA: Lab Results  Component Value Date   WBC 7.8 12/19/2017   HGB 12.1 12/19/2017   HCT 37.7 12/19/2017   MCV 96.2 12/19/2017   PLT 253 12/19/2017      Chemistry      Component Value Date/Time   NA 141 12/19/2017 1327   NA 141 11/28/2017 0833   K 4.1 12/19/2017 1327   CL 107 12/19/2017 1327   CO2 26 12/19/2017 1327   BUN 16 12/19/2017 1327   BUN 16 11/28/2017 0833   CREATININE 0.96 12/19/2017 1327      Component Value Date/Time   CALCIUM 9.8  12/19/2017 1327   ALKPHOS 73 12/19/2017 1327   AST 18 12/19/2017 1327   ALT 23 12/19/2017 1327   BILITOT 0.5 12/19/2017 1327       RADIOGRAPHIC STUDIES: Ct Coronary Morph W/cta Cor W/score W/ca W/cm &/or Wo/cm  Addendum Date: 12/05/2017   ADDENDUM REPORT: 12/05/2017 13:09 CLINICAL DATA:  72 year old female with hypertension, hyperlipidemia, morbid obesity and SOB. EXAM: Cardiac/Coronary  CT TECHNIQUE: The patient was scanned on a [employer]. FINDINGS: A 120 kV prospective scan was triggered in the descending thoracic aorta at 111 HU's. Axial non-contrast 3 mm slices were carried out through the heart. The data set was analyzed on a dedicated work station and scored using the [place]. Gantry rotation speed was 250 msecs and collimation was .6 mm. No beta blockade and 0.8 mg of sl NTG was given. The 3D data set was reconstructed in 5% intervals of the 67-82 % of the R-R cycle. Diastolic phases were analyzed on a dedicated work station using MPR, MIP and VRT modes. The patient received 80 cc of contrast. Aorta:  Normal size.  Mild calcifications.  No dissection. Aortic Valve:  Trileaflet.  No calcifications. Coronary Arteries:  Normal coronary origin.  Right dominance. RCA is a large dominant artery that gives rise to PDA and PLVB. There is minimal plaque. Left main is a large artery that gives rise to LAD and LCX arteries. Left main has no plaque. LAD is a large vessel that gives rise to one large diagonal artery. Proximal LAD has mild plaque with associated stenosis 25-50%. D1 has no significant plaque. LCX is a non-dominant artery that gives rise to one large OM1 branch. There is no plaque. Other findings: Normal pulmonary vein drainage into the left atrium. Normal let atrial appendage without a thrombus. Normal size of the pulmonary artery. IMPRESSION: 1. Coronary calcium score of 6. This was 82 percentile for age and sex matched control. 2. Normal coronary origin with right  dominance. 3. 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 Electronically Signed   By: Ena Dawley   On: 12/05/2017 13:09   Result Date: 12/05/2017 EXAM: OVER-READ INTERPRETATION  CT CHEST The following report is an over-read performed by radiologist Dr. Vinnie Langton of [facility], [place] on 12/05/2017. This over-read does not include interpretation of cardiac or coronary anatomy or pathology. The coronary calcium score/coronary CTA interpretation by the cardiologist is attached. COMPARISON:  None. FINDINGS: Aortic atherosclerosis. In the anterior aspect of the left lower lobe there is an elongated mass which measures 3.8 x 1.6 x 1.6 cm (coronal image 53 of series 602 and axial image 33 of series 12). Within the visualized portions of the thorax there is no acute consolidative airspace disease, no pleural effusions, no pneumothorax and no lymphadenopathy. Visualized portions of the upper abdomen are unremarkable. There are no aggressive appearing lytic or blastic lesions noted in the visualized portions of the skeleton. IMPRESSION: 1. 3.8 x 1.6 x 1.6 cm macrolobulated elongated mass in the anterior aspect of the left lower lobe. This is suspicious for potential neoplasm, and further evaluation with PET-CT is recommended in the near future. 2.  Aortic Atherosclerosis (ICD10-I70.0). Electronically Signed: By: Vinnie Langton M.D. On: 12/05/2017 10:41    ASSESSMENT: This is a very pleasant 72 years old white female with concerning left lower lobe lung mass suspicious for lung cancer that was found incidentally on CT scan of the coronaries.   PLAN: I had a lengthy discussion with the patient today about her current condition and further investigation to confirm diagnosis as well as treatment options. I personally and independently reviewed the scan images and discussed the result and showed  the images to the patient today. I recommended for the patient to have a dedicated CT scan of the chest with contrast for evaluation of this left lower lobe lung lesion.  Once the CT scan of the chest confirm the lesion, we will consider the patient for a PET scan for further evaluation and staging of her disease. The patient had a lot of question about her condition and future treatment plans.  I explained to the patient that if the CT scan of the chest and the PET scan showed no other concerning findings and the lesion is hypermetabolic on the PET scan, I may refer her directly to cardiothoracic surgery for consideration of surgical resection if her pulmonary functions are adequate for surgery. If the lesion is hypermetabolic on the PET scan and the patient is not a good surgical candidate for resection, I will consider her for CT-guided core biopsy followed by referral to radiation oncology for curative radiotherapy if there is no other metastatic lesion in the lymph nodes or distant metastasis. I will arrange for the patient to come back for follow-up visit at the multidisciplinary thoracic oncology clinic for more detailed discussion of her treatment options based on the PET scan results. The patient was advised to call immediately if she has any concerning symptoms in the interval.  The patient voices understanding of current disease status and treatment options and is in agreement with the current care plan.  All questions were answered. The patient knows to call the clinic with any problems, questions or concerns. We can certainly see the patient much sooner if necessary.  Thank you so much for allowing me to participate in the care of [employer]. I will continue to follow up the patient with you and  assist in her care.  I spent 55 minutes counseling the patient face to face. The total time spent in the appointment was 80 minutes.  Disclaimer: This note was dictated with voice  recognition software. Similar sounding words can inadvertently be transcribed and may not be corrected upon review.   Eilleen Kempf December 19, 2017, 3:00 PM

## 2017-12-22 ENCOUNTER — Encounter (HOSPITAL_COMMUNITY): Payer: Self-pay

## 2017-12-22 ENCOUNTER — Ambulatory Visit (HOSPITAL_COMMUNITY)
Admission: RE | Admit: 2017-12-22 | Discharge: 2017-12-22 | Disposition: A | Discharge status: Home / Self Care | Payer: Medicare Other | Source: Ambulatory Visit | Attending: Internal Medicine | Admitting: Internal Medicine

## 2017-12-22 DIAGNOSIS — R911 Solitary pulmonary nodule: Secondary | ICD-10-CM | POA: Diagnosis present

## 2017-12-22 DIAGNOSIS — K760 Fatty (change of) liver, not elsewhere classified: Secondary | ICD-10-CM | POA: Diagnosis not present

## 2017-12-22 DIAGNOSIS — I700 Atherosclerosis of aorta: Secondary | ICD-10-CM | POA: Diagnosis not present

## 2017-12-22 DIAGNOSIS — R918 Other nonspecific abnormal finding of lung field: Secondary | ICD-10-CM | POA: Insufficient documentation

## 2017-12-22 DIAGNOSIS — I2510 Atherosclerotic heart disease of native coronary artery without angina pectoris: Secondary | ICD-10-CM | POA: Insufficient documentation

## 2017-12-22 MED ORDER — SODIUM CHLORIDE (PF) 0.9 % IJ SOLN
INTRAMUSCULAR | Status: AC
  Filled 2017-12-22: qty 50

## 2017-12-22 MED ORDER — IOHEXOL 300 MG/ML  SOLN
75.0000 mL | Freq: Once | INTRAMUSCULAR | Status: AC | PRN
  Administered 2017-12-22: 75 mL via INTRAVENOUS

## 2017-12-23 ENCOUNTER — Observation Stay (HOSPITAL_COMMUNITY)
Admission: EM | Admit: 2017-12-23 | Discharge: 2017-12-24 | Disposition: A | Discharge status: Home / Self Care | Payer: Medicare Other | Attending: Family Medicine | Admitting: Family Medicine

## 2017-12-23 ENCOUNTER — Encounter (HOSPITAL_COMMUNITY): Payer: Self-pay | Admitting: *Deleted

## 2017-12-23 ENCOUNTER — Other Ambulatory Visit: Payer: Self-pay

## 2017-12-23 DIAGNOSIS — I10 Essential (primary) hypertension: Secondary | ICD-10-CM

## 2017-12-23 DIAGNOSIS — E119 Type 2 diabetes mellitus without complications: Secondary | ICD-10-CM

## 2017-12-23 DIAGNOSIS — Z87891 Personal history of nicotine dependence: Secondary | ICD-10-CM | POA: Insufficient documentation

## 2017-12-23 DIAGNOSIS — Z853 Personal history of malignant neoplasm of breast: Secondary | ICD-10-CM | POA: Diagnosis not present

## 2017-12-23 DIAGNOSIS — I2699 Other pulmonary embolism without acute cor pulmonale: Secondary | ICD-10-CM | POA: Diagnosis not present

## 2017-12-23 DIAGNOSIS — Z79899 Other long term (current) drug therapy: Secondary | ICD-10-CM | POA: Insufficient documentation

## 2017-12-23 DIAGNOSIS — K219 Gastro-esophageal reflux disease without esophagitis: Secondary | ICD-10-CM | POA: Diagnosis not present

## 2017-12-23 DIAGNOSIS — E1159 Type 2 diabetes mellitus with other circulatory complications: Secondary | ICD-10-CM

## 2017-12-23 DIAGNOSIS — R918 Other nonspecific abnormal finding of lung field: Secondary | ICD-10-CM | POA: Diagnosis not present

## 2017-12-23 DIAGNOSIS — Z888 Allergy status to other drugs, medicaments and biological substances status: Secondary | ICD-10-CM | POA: Insufficient documentation

## 2017-12-23 DIAGNOSIS — E785 Hyperlipidemia, unspecified: Secondary | ICD-10-CM

## 2017-12-23 DIAGNOSIS — R0609 Other forms of dyspnea: Secondary | ICD-10-CM | POA: Diagnosis present

## 2017-12-23 DIAGNOSIS — E1122 Type 2 diabetes mellitus with diabetic chronic kidney disease: Secondary | ICD-10-CM

## 2017-12-23 DIAGNOSIS — Z86711 Personal history of pulmonary embolism: Secondary | ICD-10-CM | POA: Diagnosis present

## 2017-12-23 DIAGNOSIS — L409 Psoriasis, unspecified: Secondary | ICD-10-CM | POA: Diagnosis not present

## 2017-12-23 DIAGNOSIS — Z885 Allergy status to narcotic agent status: Secondary | ICD-10-CM | POA: Insufficient documentation

## 2017-12-23 DIAGNOSIS — Z881 Allergy status to other antibiotic agents status: Secondary | ICD-10-CM | POA: Insufficient documentation

## 2017-12-23 LAB — CBC WITH DIFFERENTIAL/PLATELET
Abs Immature Granulocytes: 0.02 10*3/uL (ref 0.00–0.07)
Basophils Absolute: 0.1 10*3/uL (ref 0.0–0.1)
Basophils Relative: 1 %
Eosinophils Absolute: 0.4 10*3/uL (ref 0.0–0.5)
Eosinophils Relative: 5 %
HCT: 43 % (ref 36.0–46.0)
Hemoglobin: 13.7 g/dL (ref 12.0–15.0)
Immature Granulocytes: 0 %
Lymphocytes Relative: 38 %
Lymphs Abs: 3 10*3/uL (ref 0.7–4.0)
MCH: 31.1 pg (ref 26.0–34.0)
MCHC: 31.9 g/dL (ref 30.0–36.0)
MCV: 97.7 fL (ref 80.0–100.0)
Monocytes Absolute: 0.8 10*3/uL (ref 0.1–1.0)
Monocytes Relative: 10 %
Neutro Abs: 3.7 10*3/uL (ref 1.7–7.7)
Neutrophils Relative %: 46 %
Platelets: 277 10*3/uL (ref 150–400)
RBC: 4.4 MIL/uL (ref 3.87–5.11)
RDW: 14.1 % (ref 11.5–15.5)
WBC: 8 10*3/uL (ref 4.0–10.5)
nRBC: 0 % (ref 0.0–0.2)

## 2017-12-23 NOTE — ED Triage Notes (Signed)
Pt had CT yesterday, received call from Dr. Benay Spice approx [phone] requesting to come in to start Lovenox.

## 2017-12-23 NOTE — ED Provider Notes (Addendum)
[facility] DEPT Provider Note   CSN: [id number] Arrival date & time: 12/23/17  2228     History   Chief Complaint Chief Complaint  Patient presents with  . Pulmonary embolus    HPI [name] is a 72 y.o. female.  72 yo F with a chief complaint of shortness of breath on exertion.  Patient work-up for this as an outpatient.  She ended up having a CT scan done with contrast to evaluate for a right lung mass.  Incidentally noted with pulmonary embolism.  She was then called this evening and sent here for admission.  Patient denies chest pain denies lower extremity edema.  Denies hemoptysis.  The history is provided by the patient.  Shortness of Breath  This is a new problem. The average episode lasts 2 weeks. The problem occurs continuously.The current episode started more than 1 week ago. The problem has not changed since onset.Pertinent negatives include no fever, no headaches, no rhinorrhea, no wheezing, no chest pain and no vomiting. She has tried nothing for the symptoms. The treatment provided no relief. She has had no prior hospitalizations. She has had no prior ED visits. She has had no prior ICU admissions.    Past Medical History:  Diagnosis Date  . ALLERGIC RHINITIS   . ANEMIA-NOS   . ASTHMA   . Carpal tunnel syndrome   . COLONIC POLYPS, HX OF   . DIABETES MELLITUS, TYPE II    diet controlled  . GERD   . HYPERLIPIDEMIA   . HYPERTENSION   . OSTEOPENIA   . Psoriasis    severe, began soriatane 01/2012  . Rectal fissure   . Scoliosis     Patient Active Problem List   Diagnosis Date Noted  . Pulmonary embolism ([place]) 12/24/2017  . Lung mass 12/24/2017  . HLD (hyperlipidemia) 12/24/2017  . Small vessel disease, cerebrovascular 07/31/2017  . Atypical chest pain 06/14/2017  . Dizziness 06/14/2017  . Easy bruising 05/30/2017  . Hyperuricemia 05/03/2017  . Arthralgia 05/01/2017  . Hair loss 05/01/2017  . Vitamin D  deficiency 10/31/2016  . Redness of skin, feet 10/31/2016  . Diastolic dysfunction 94/49/6759  . Vertigo 12/24/2015  . Fatigue 12/24/2015  . Nonspecific abnormal electrocardiogram (ECG) (EKG) 12/24/2015  . Bilateral carotid artery disease, Mild 05/31/2015  . Thyroid nodule 05/31/2015  . Psoriasis   . Scoliosis   . Diabetes type 2, controlled ([place]) 06/03/2008  . Dyslipidemia 06/03/2008  . CARPAL TUNNEL SYNDROME 06/03/2008  . HTN (hypertension) 06/03/2008  . ALLERGIC RHINITIS 06/03/2008  . Asthma 06/03/2008  . GERD (gastroesophageal reflux disease) 06/03/2008  . Osteopenia 06/03/2008  . COLONIC POLYPS, HX OF 06/03/2008    Past Surgical History:  Procedure Laterality Date  . benign rectal growth  2004   removed by Dr. Zella Richer  . BREAST BIOPSY Right   . BREAST EXCISIONAL BIOPSY Left   . BREAST SURGERY  1988   biopsy  . CESAREAN SECTION       OB History   None      Home Medications    Prior to Admission medications   Medication Sig Start Date End Date Taking? Authorizing Provider  BENICAR 20 MG tablet TAKE ONE (1) TABLET BY MOUTH TWO (2) TIMES DAILY Patient taking differently: Take 20 mg by mouth daily.  12/04/17  Yes Burns, Claudina Lick, MD  blood glucose meter kit and supplies KIT Dispense based on patient and insurance preference. ONE TOUCH ULTRA 05/01/17  Yes Billey Gosling  J, MD  BYSTOLIC 2.5 MG tablet TAKE ONE (1) TABLET BY MOUTH EVERY DAY Patient taking differently: Take 2.5 mg by mouth daily after breakfast.  12/04/17  Yes Burns, Claudina Lick, MD  Cholecalciferol (VITAMIN D3) 2000 units TABS Take 2,000 Units by mouth at bedtime.    Yes [provider]  clindamycin (CLEOCIN T) 1 % external solution Apply 1 application topically daily as needed (cuts).  09/13/17  Yes [provider]  clobetasol (TEMOVATE) 0.05 % external solution Apply 1 application topically daily.  04/17/17 04/17/18 Yes [provider]  Coenzyme Q10 (COQ10) 200 MG CAPS Take 1 capsule  by mouth at bedtime. Gummies   Yes [provider]  doxylamine, Sleep, (UNISOM) 25 MG tablet Take 12.5 mg by mouth at bedtime as needed for sleep.    Yes [provider]  ENBREL SURECLICK 50 MG/ML injection Inject into the skin once a week. Mondays. Patient unsure of dose 12/05/12  Yes [provider]  glucose blood (ONE TOUCH ULTRA TEST) test strip Use test strip to check blood sugar at least 3 times 05/01/17  Yes Burns, Claudina Lick, MD  halobetasol (ULTRAVATE) 0.05 % cream Apply 1 application topically daily. 10/24/16  Yes [provider]  ibuprofen (ADVIL,MOTRIN) 200 MG tablet Take 200 mg by mouth every 6 (six) hours as needed for mild pain.   Yes [provider]  Lancets (ONETOUCH ULTRASOFT) lancets Use lancet to test blood sugar at least twice a day or as directed by your physician. 05/01/17  Yes Burns, Claudina Lick, MD  loratadine (CLARITIN REDITABS) 10 MG dissolvable tablet Take 10 mg by mouth every other day as needed for allergies.   Yes [provider]  omeprazole (PRILOSEC) 20 MG capsule Take 20 mg by mouth daily. OTC   Yes [provider]  oxymetazoline (AFRIN 12 HOUR) 0.05 % nasal spray Place 1 spray into the nose 2 (two) times daily.    Yes [provider]  Pediatric Multiple Vit-C-FA (MULTIVITAMIN ANIMAL SHAPES, WITH CA/FA,) with C & FA chewable tablet Chew 1 tablet by mouth at bedtime.    Yes [provider]  Pitavastatin Calcium 1 MG TABS Take 1 tablet (1 mg total) by mouth daily. Patient taking differently: Take 1 mg by mouth at bedtime.  07/31/17  Yes [name], MD    Family History Family History  Problem Relation Age of Onset  . Lung cancer Father   . Arthritis Other        Parents  . Asthma Other        parent, other relative  . Breast cancer Other        other relative  . Hypertension Other        parent, other relative  . Heart disease Other        parent, other relative  . Breast cancer  Maternal Aunt 68    Social History Social History   Tobacco Use  . Smoking status: Former Smoker    Last attempt to quit: 02/14/1974    Years since quitting: 43.8  . Smokeless tobacco: Never Used  Substance Use Topics  . Alcohol use: No  . Drug use: No     Allergies   Amlodipine; Clarithromycin; Codeine; Erythromycin; Statins; and Tetracycline   Review of Systems Review of Systems  Constitutional: Negative for chills and fever.  HENT: Negative for congestion and rhinorrhea.   Eyes: Negative for redness and visual disturbance.  Respiratory: Positive for shortness of breath (  on exertion). Negative for wheezing.   Cardiovascular: Negative for chest pain and palpitations.  Gastrointestinal: Negative for nausea and vomiting.  Genitourinary: Negative for dysuria and urgency.  Musculoskeletal: Negative for arthralgias and myalgias.  Skin: Negative for pallor and wound.  Neurological: Negative for dizziness and headaches.     Physical Exam Updated Vital Signs BP (!) 162/77 (BP Location: Right Arm)   Pulse (!) 58   Temp 98 F (36.7 C) (Oral)   Resp 14   Ht '5\' 1"'  (1.549 m)   Wt 63.6 kg   SpO2 100%   BMI 26.51 kg/m   Physical Exam  Constitutional: She is oriented to person, place, and time. She appears well-developed and well-nourished. No distress.  HENT:  Head: Normocephalic and atraumatic.  Eyes: Pupils are equal, round, and reactive to light. EOM are normal.  Neck: Normal range of motion. Neck supple.  Cardiovascular: Normal rate and regular rhythm. Exam reveals no gallop and no friction rub.  No murmur heard. Pulmonary/Chest: Effort normal. She has no wheezes. She has no rales.  Abdominal: Soft. She exhibits no distension. There is no tenderness.  Musculoskeletal: She exhibits no edema or tenderness.  Neurological: She is alert and oriented to person, place, and time.  Skin: Skin is warm and dry. She is not diaphoretic.  Psychiatric: She has a normal mood and  affect. Her behavior is normal.  Nursing note and vitals reviewed.    ED Treatments / Results  Labs (all labs ordered are listed, but only abnormal results are displayed) Labs Reviewed  BASIC METABOLIC PANEL - Abnormal; Notable for the following components:      Result Value   BUN 25 (*)    GFR calc non Af Amer 56 (*)    All other components within normal limits  CBC WITH DIFFERENTIAL/PLATELET  BRAIN NATRIURETIC PEPTIDE  I-STAT TROPONIN, ED    EKG EKG Interpretation  Date/Time:  Saturday December 23 2017 23:23:11 EST Ventricular Rate:  70 PR Interval:    QRS Duration: 106 QT Interval:  412 QTC Calculation: 445 R Axis:   -8 Text Interpretation:  Sinus rhythm Low voltage, precordial leads No significant change since last tracing Confirmed by Deno Etienne [phone]) on 12/23/2017 11:50:46 PM   Radiology Ct Chest W Contrast  Result Date: 12/23/2017 CLINICAL DATA:  72 year old female with history of cough for the past 8-10 months. Shortness of breath for the past 4-5 months. EXAM: CT CHEST WITH CONTRAST TECHNIQUE: Multidetector CT imaging of the chest was performed during intravenous contrast administration. CONTRAST:  39m OMNIPAQUE IOHEXOL 300 MG/ML  SOLN COMPARISON:  Cardiac CTA 12/05/2017 FINDINGS: Cardiovascular: Small nonobstructive filling defect in the distal aspect of the left main pulmonary artery (axial image 63 of series 2), compatible with a nonobstructive pulmonary embolism. Heart size is normal. There is no significant pericardial fluid, thickening or pericardial calcification. There is aortic atherosclerosis, as well as atherosclerosis of the great vessels of the mediastinum and the coronary arteries, including calcified atherosclerotic plaque in the left main and left anterior descending coronary arteries. Calcifications of the aortic valve. Mediastinum/Nodes: No pathologically enlarged mediastinal or hilar lymph nodes. Esophagus is unremarkable in appearance. No axillary  lymphadenopathy. Lungs/Pleura: Previously noted elongated mass in the left lower lobe is similar to the recent prior examination currently measuring 3.6 x 1.7 x 1.8 cm (coronal image 69 of series 5 and axial image 112 of series 7). Several other smaller pulmonary nodules are scattered throughout the lungs bilaterally, many of which are somewhat  elongated and irregular in shape. No acute consolidative airspace disease. No pleural effusions. Upper Abdomen: Diffuse low attenuation throughout the visualized hepatic parenchyma, indicative of hepatic steatosis. Calcified granuloma in the liver incidentally noted. 3.7 x 2.8 cm low-attenuation lesion in the right kidney is incompletely imaged, but likely represents a simple cyst. Aortic atherosclerosis. Musculoskeletal: There are no aggressive appearing lytic or blastic lesions noted in the visualized portions of the skeleton. IMPRESSION: 1. In addition to the previously noted mass in the left lower lobe there several other smaller irregular-shaped nodules scattered throughout the lungs bilaterally. The irregular shape of the nodules would be unusual for metastatic disease. The possibility of a benign etiology such as multifocal bronchoceles should be considered, but is a diagnosis of exclusion. Malignancy should be excluded, and further evaluation with PET-CT is again suggested. 2. Nonocclusive filling defect in the distal left main pulmonary artery compatible with pulmonary embolism. 3. Hepatic steatosis. 4. Aortic atherosclerosis, in addition to left main and left anterior descending coronary artery disease. Assessment for potential risk factor modification, dietary therapy or pharmacologic therapy may be warranted, if clinically indicated. 5. There are calcifications of the aortic valve. Echocardiographic correlation for evaluation of potential valvular dysfunction may be warranted if clinically indicated. These results will be called to the ordering clinician or  representative by the Radiologist Assistant, and communication documented in the PACS or zVision Dashboard. Aortic Atherosclerosis (ICD10-I70.0). Electronically Signed   By: Vinnie Langton M.D.   On: 12/23/2017 20:34    Procedures Procedures (including critical care time)  Medications Ordered in ED Medications  irbesartan (AVAPRO) tablet 150 mg (150 mg Oral Given 12/24/17 0303)  nebivolol (BYSTOLIC) tablet 2.5 mg (2.5 mg Oral Not Given 12/24/17 0303)  pantoprazole (PROTONIX) EC tablet 40 mg (has no administration in time range)  cholecalciferol (VITAMIN D3) tablet 2,000 Units (2,000 Units Oral Not Given 12/24/17 0304)  multivitamin with minerals tablet 1 tablet (1 tablet Oral Not Given 12/24/17 0304)  loratadine (CLARITIN) tablet 10 mg (has no administration in time range)  oxymetazoline (AFRIN) 0.05 % nasal spray 1 spray (has no administration in time range)  acetaminophen (TYLENOL) tablet 650 mg (has no administration in time range)    Or  acetaminophen (TYLENOL) suppository 650 mg (has no administration in time range)  insulin aspart (novoLOG) injection 0-9 Units (has no administration in time range)  enoxaparin (LOVENOX) injection 65 mg (has no administration in time range)     Initial Impression / Assessment and Plan / ED Course  I have reviewed the triage vital signs and the nursing notes.  Pertinent labs & imaging results that were available during my care of the patient were reviewed by me and considered in my medical decision making (see chart for details).     72 yo F with a chief complaint of shortness of breath on exertion.  Patient had a pulmonary embolism incidentally found on a CT scan with contrast to further identify a right lung mass.  As the patient has likely cancer and a new pulmonary embolism this makes her intermediate risk on the PESI score. Will obtain labs, ecg start on lovenox if able and discuss with hospitalist.   CRITICAL CARE Performed by: Cecilio Asper   Total critical care time: 35 minutes  Critical care time was exclusive of separately billable procedures and treating other patients.  Critical care was necessary to treat or prevent imminent or life-threatening deterioration.  Critical care was time spent personally by me on the following  activities: development of treatment plan with patient and/or surrogate as well as nursing, discussions with consultants, evaluation of patient's response to treatment, examination of patient, obtaining history from patient or surrogate, ordering and performing treatments and interventions, ordering and review of laboratory studies, ordering and review of radiographic studies, pulse oximetry and re-evaluation of patient's condition.   The patients results and plan were reviewed and discussed.   Any x-rays performed were independently reviewed by myself.   Differential diagnosis were considered with the presenting HPI.  Medications  irbesartan (AVAPRO) tablet 150 mg (150 mg Oral Given 12/24/17 0303)  nebivolol (BYSTOLIC) tablet 2.5 mg (2.5 mg Oral Not Given 12/24/17 0303)  pantoprazole (PROTONIX) EC tablet 40 mg (has no administration in time range)  cholecalciferol (VITAMIN D3) tablet 2,000 Units (2,000 Units Oral Not Given 12/24/17 0304)  multivitamin with minerals tablet 1 tablet (1 tablet Oral Not Given 12/24/17 0304)  loratadine (CLARITIN) tablet 10 mg (has no administration in time range)  oxymetazoline (AFRIN) 0.05 % nasal spray 1 spray (has no administration in time range)  acetaminophen (TYLENOL) tablet 650 mg (has no administration in time range)    Or  acetaminophen (TYLENOL) suppository 650 mg (has no administration in time range)  insulin aspart (novoLOG) injection 0-9 Units (has no administration in time range)  enoxaparin (LOVENOX) injection 65 mg (has no administration in time range)    Vitals:   12/24/17 0100 12/24/17 0300 12/24/17 0320 12/24/17 0327  BP: (!) 184/94  (!) 151/84  (!) 162/77  Pulse: 65 63  (!) 58  Resp: '15 12  14  ' Temp:    98 F (36.7 C)  TempSrc:    Oral  SpO2: 98% 96%  100%  Weight:   63.6 kg   Height:   '5\' 1"'  (1.549 m)     Final diagnoses:  Acute pulmonary embolism without acute cor pulmonale, unspecified pulmonary embolism type ([place])    Admission/ observation were discussed with the admitting physician, patient and/or family and they are comfortable with the plan.   Final Clinical Impressions(s) / ED Diagnoses   Final diagnoses:  Acute pulmonary embolism without acute cor pulmonale, unspecified pulmonary embolism type [facility])    ED Discharge Orders    None       Deno Etienne, DO 12/24/17 [place], [place], DO 01/10/18 1211

## 2017-12-23 NOTE — ED Notes (Signed)
Bed: QJ33 Expected date:  Expected time:  Means of arrival:  Comments: Triage 1

## 2017-12-24 DIAGNOSIS — I2699 Other pulmonary embolism without acute cor pulmonale: Secondary | ICD-10-CM | POA: Diagnosis not present

## 2017-12-24 DIAGNOSIS — R918 Other nonspecific abnormal finding of lung field: Secondary | ICD-10-CM

## 2017-12-24 DIAGNOSIS — Z86711 Personal history of pulmonary embolism: Secondary | ICD-10-CM | POA: Diagnosis present

## 2017-12-24 DIAGNOSIS — E785 Hyperlipidemia, unspecified: Secondary | ICD-10-CM

## 2017-12-24 LAB — BASIC METABOLIC PANEL
Anion gap: 11 (ref 5–15)
BUN: 25 mg/dL — ABNORMAL HIGH (ref 8–23)
CO2: 25 mmol/L (ref 22–32)
Calcium: 9.6 mg/dL (ref 8.9–10.3)
Chloride: 105 mmol/L (ref 98–111)
Creatinine, Ser: 0.97 mg/dL (ref 0.44–1.00)
GFR calc Af Amer: >60 mL/min (ref >60)
GFR calc non Af Amer: 56 mL/min — ABNORMAL LOW (ref >60)
Glucose, Bld: 99 mg/dL (ref 70–99)
Potassium: 3.8 mmol/L (ref 3.5–5.1)
Sodium: 141 mmol/L (ref 135–145)

## 2017-12-24 LAB — BRAIN NATRIURETIC PEPTIDE: B Natriuretic Peptide: 40.2 pg/mL (ref 0.0–100.0)

## 2017-12-24 LAB — I-STAT TROPONIN, ED: Troponin i, poc: 0 ng/mL (ref 0.00–0.08)

## 2017-12-24 LAB — GLUCOSE, CAPILLARY: Glucose-Capillary: 103 mg/dL — ABNORMAL HIGH (ref 70–99)

## 2017-12-24 MED ORDER — ACETAMINOPHEN 325 MG PO TABS: 650.0000 mg | ORAL_TABLET | Freq: Four times a day (QID) | ORAL | Status: DC | PRN

## 2017-12-24 MED ORDER — PANTOPRAZOLE SODIUM 40 MG PO TBEC: 40.0000 mg | DELAYED_RELEASE_TABLET | Freq: Every day | ORAL | Status: DC

## 2017-12-24 MED ORDER — RIVAROXABAN (XARELTO) VTE STARTER PACK (15 & 20 MG): ORAL_TABLET | ORAL | 0 refills | Status: DC

## 2017-12-24 MED ORDER — INSULIN ASPART 100 UNIT/ML ~~LOC~~ SOLN: 0.0000 [IU] | Freq: Three times a day (TID) | SUBCUTANEOUS | Status: DC

## 2017-12-24 MED ORDER — IRBESARTAN 150 MG PO TABS
150.0000 mg | ORAL_TABLET | Freq: Every day | ORAL | Status: DC
  Administered 2017-12-24: 150 mg via ORAL
  Filled 2017-12-24: qty 1

## 2017-12-24 MED ORDER — ENOXAPARIN SODIUM 80 MG/0.8ML ~~LOC~~ SOLN
65.0000 mg | Freq: Once | SUBCUTANEOUS | Status: DC
  Administered 2017-12-24: 65 mg via SUBCUTANEOUS
  Filled 2017-12-24: qty 0.65

## 2017-12-24 MED ORDER — ENOXAPARIN (LOVENOX) PATIENT EDUCATION KIT
PACK | Freq: Once | Status: DC
  Filled 2017-12-24: qty 1

## 2017-12-24 MED ORDER — ANIMAL SHAPES WITH C & FA PO CHEW: 1.0000 | CHEWABLE_TABLET | Freq: Every day | ORAL | Status: DC

## 2017-12-24 MED ORDER — LORATADINE 10 MG PO TABS: 10.0000 mg | ORAL_TABLET | ORAL | Status: DC | PRN

## 2017-12-24 MED ORDER — ACETAMINOPHEN 650 MG RE SUPP: 650.0000 mg | Freq: Four times a day (QID) | RECTAL | Status: DC | PRN

## 2017-12-24 MED ORDER — RIVAROXABAN 15 MG PO TABS
15.0000 mg | ORAL_TABLET | Freq: Two times a day (BID) | ORAL | Status: DC
  Administered 2017-12-24: 15 mg via ORAL
  Filled 2017-12-24: qty 1

## 2017-12-24 MED ORDER — OXYMETAZOLINE HCL 0.05 % NA SOLN
1.0000 | Freq: Two times a day (BID) | NASAL | Status: DC
  Filled 2017-12-24: qty 15

## 2017-12-24 MED ORDER — VITAMIN D 25 MCG (1000 UNIT) PO TABS: 2000.0000 [IU] | ORAL_TABLET | Freq: Every day | ORAL | Status: DC

## 2017-12-24 MED ORDER — ADULT MULTIVITAMIN W/MINERALS CH: 1.0000 | ORAL_TABLET | Freq: Every day | ORAL | Status: DC

## 2017-12-24 MED ORDER — ENOXAPARIN SODIUM 80 MG/0.8ML ~~LOC~~ SOLN: 65.0000 mg | Freq: Two times a day (BID) | SUBCUTANEOUS | Status: DC

## 2017-12-24 MED ORDER — COQ10 200 MG PO CAPS: 1.0000 | ORAL_CAPSULE | Freq: Every day | ORAL | Status: DC

## 2017-12-24 MED ORDER — NEBIVOLOL HCL 2.5 MG PO TABS
2.5000 mg | ORAL_TABLET | Freq: Every day | ORAL | Status: DC
  Filled 2017-12-24: qty 1

## 2017-12-24 NOTE — Progress Notes (Signed)
Patient discharged home, discharge instructions/precription  given and explained to patient and he verbalized understanding, denies any pain/distress. No wound noted, skin intact, transported to the car by staff.

## 2017-12-24 NOTE — ED Notes (Signed)
Admitting provider at the bedside.

## 2017-12-24 NOTE — Progress Notes (Signed)
ANTICOAGULATION CONSULT NOTE - Initial Consult  Pharmacy Consult for enoxaparin Indication: pulmonary embolus  Allergies  Allergen Reactions  . Amlodipine     swelling  . Clarithromycin     Nausea, I can pass out   . Codeine     Nausea, I can pass out   . Erythromycin     Other reaction(s): Vomiting (intolerance)  . Statins     Muscle pain   . Tetracycline     Patient Measurements: Height: 5\' 1"  (154.9 cm) Weight: 140 lb 4.8 oz (63.6 kg) IBW/kg (Calculated) : 47.8 Heparin Dosing Weight:   Vital Signs: Temp: 98 F (36.7 C) (11/10 0327) Temp Source: Oral (11/10 0327) BP: 162/77 (11/10 0327) Pulse Rate: 58 (11/10 0327)  Labs: Recent Labs    12/23/17 2339  HGB 13.7  HCT 43.0  PLT 277  CREATININE 0.97    Estimated Creatinine Clearance: 43.5 mL/min (by C-G formula based on SCr of 0.97 mg/dL).   Medical History: Past Medical History:  Diagnosis Date  . ALLERGIC RHINITIS   . ANEMIA-NOS   . ASTHMA   . Carpal tunnel syndrome   . COLONIC POLYPS, HX OF   . DIABETES MELLITUS, TYPE II    diet controlled  . GERD   . HYPERLIPIDEMIA   . HYPERTENSION   . OSTEOPENIA   . Psoriasis    severe, began soriatane 01/2012  . Rectal fissure   . Scoliosis     Medications:  Scheduled:  . cholecalciferol  2,000 Units Oral QHS  . enoxaparin (LOVENOX) injection  65 mg Subcutaneous BID  . insulin aspart  0-9 Units Subcutaneous TID WC  . irbesartan  150 mg Oral Daily  . multivitamin with minerals  1 tablet Oral QHS  . nebivolol  2.5 mg Oral Daily  . oxymetazoline  1 spray Each Nare BID  . pantoprazole  40 mg Oral Daily    Assessment: Patient with new PE.  MD wishes for enoxaparin per pharmacy.  Goal of Therapy:  Anti-Xa level 0.6-1 units/ml 4hrs after LMWH dose given Monitor platelets by anticoagulation protocol: Yes   Plan:  Enoxaparin 65mg  sq q12hr  Tyler Deis, Shea Stakes [place] 12/24/2017,5:28 AM

## 2017-12-24 NOTE — ED Notes (Signed)
I have spoke with JC in the pharmacy. Clarified with him okay to give 65mg  lovenox at this time as it is appropriate dose. Okay to document on previous lovenox ordered prior.

## 2017-12-24 NOTE — ED Notes (Signed)
ED TO INPATIENT HANDOFF REPORT  Name/Age/Gender [name] 72 y.o. female  Code Status    Code Status Orders  (From admission, onward)         Start     Ordered   12/24/17 0125  Full code  Continuous     12/24/17 0125        Code Status History    This patient has a current code status but no historical code status.    Advance Directive Documentation     Most Recent Value  Type of Advance Directive  Healthcare Power of Attorney  Pre-existing out of facility DNR order (yellow form or pink MOST form)  -  "MOST" Form in Place?  -      Home/SNF/Other Home  Chief Complaint Blood clot in lung  Level of Care/Admitting Diagnosis ED Disposition    ED Disposition Condition [place]: [place] [[id number]]  Level of Care: Telemetry [5]  Admit to tele based on following criteria: Other see comments  Comments: PE  Diagnosis: Pulmonary embolism [facility]) [[id number]]  Admitting Physician: Shela Leff [[id number]]  Attending Physician: Shela Leff [[id number]]  PT Class (Do Not Modify): Observation [104]  PT Acc Code (Do Not Modify): Observation [10022]       Medical History Past Medical History:  Diagnosis Date  . ALLERGIC RHINITIS   . ANEMIA-NOS   . ASTHMA   . Carpal tunnel syndrome   . COLONIC POLYPS, HX OF   . DIABETES MELLITUS, TYPE II    diet controlled  . GERD   . HYPERLIPIDEMIA   . HYPERTENSION   . OSTEOPENIA   . Psoriasis    severe, began soriatane 01/2012  . Rectal fissure   . Scoliosis     Allergies Allergies  Allergen Reactions  . Amlodipine     swelling  . Clarithromycin     Nausea, I can pass out   . Codeine     Nausea, I can pass out   . Erythromycin     Other reaction(s): Vomiting (intolerance)  . Statins     Muscle pain   . Tetracycline     IV Location/Drains/Wounds Patient Lines/Drains/Airways Status   Active Line/Drains/Airways    Name:   Placement date:   Placement time:    Site:   Days:   Peripheral IV 12/23/17 Left;Lateral;Upper Forearm   12/23/17    2353    Forearm   1          Labs/Imaging Results for orders placed or performed during the hospital encounter of 12/23/17 (from the past 48 hour(s))  CBC with Differential     Status: None   Collection Time: 12/23/17 11:39 PM  Result Value Ref Range   WBC 8.0 4.0 - 10.5 K/uL   RBC 4.40 3.87 - 5.11 MIL/uL   Hemoglobin 13.7 12.0 - 15.0 g/dL   HCT 43.0 36.0 - 46.0 %   MCV 97.7 80.0 - 100.0 fL   MCH 31.1 26.0 - 34.0 pg   MCHC 31.9 30.0 - 36.0 g/dL   RDW 14.1 11.5 - 15.5 %   Platelets 277 150 - 400 K/uL   nRBC 0.0 0.0 - 0.2 %   Neutrophils Relative % 46 %   Neutro Abs 3.7 1.7 - 7.7 K/uL   Lymphocytes Relative 38 %   Lymphs Abs 3.0 0.7 - 4.0 K/uL   Monocytes Relative 10 %   Monocytes Absolute 0.8 0.1 - 1.0 K/uL  Eosinophils Relative 5 %   Eosinophils Absolute 0.4 0.0 - 0.5 K/uL   Basophils Relative 1 %   Basophils Absolute 0.1 0.0 - 0.1 K/uL   Immature Granulocytes 0 %   Abs Immature Granulocytes 0.02 0.00 - 0.07 K/uL    Comment: Performed at [facility], [place] [address], [place], [place] [address]  Basic metabolic panel     Status: Abnormal   Collection Time: 12/23/17 11:39 PM  Result Value Ref Range   Sodium 141 135 - 145 mmol/L   Potassium 3.8 3.5 - 5.1 mmol/L   Chloride 105 98 - 111 mmol/L   CO2 25 22 - 32 mmol/L   Glucose, Bld 99 70 - 99 mg/dL   BUN 25 (H) 8 - 23 mg/dL   Creatinine, Ser 0.97 0.44 - 1.00 mg/dL   Calcium 9.6 8.9 - 10.3 mg/dL   GFR calc non Af Amer 56 (L) >60 mL/min   GFR calc Af Amer >60 >60 mL/min    Comment: (NOTE) The eGFR has been calculated using the CKD EPI equation. This calculation has not been validated in all clinical situations. eGFR's persistently <60 mL/min signify possible Chronic Kidney Disease.    Anion gap 11 5 - 15    Comment: Performed at [facility], [place] [address] [place], [place] [address]  Brain  natriuretic peptide     Status: None   Collection Time: 12/23/17 11:39 PM  Result Value Ref Range   B Natriuretic Peptide 40.2 0.0 - 100.0 pg/mL    Comment: Performed at [facility], [place] [address], [place], [place] [address]  I-stat troponin, ED     Status: None   Collection Time: 12/23/17 11:50 PM  Result Value Ref Range   Troponin i, poc 0.00 0.00 - 0.08 ng/mL   Comment 3            Comment: Due to the release kinetics of cTnI, a negative result within the first hours of the onset of symptoms does not rule out myocardial infarction with certainty. If myocardial infarction is still suspected, repeat the test at appropriate intervals.    Ct Chest W Contrast  Result Date: 12/23/2017 CLINICAL DATA:  72 year old female with history of cough for the past 8-10 months. Shortness of breath for the past 4-5 months. EXAM: CT CHEST WITH CONTRAST TECHNIQUE: Multidetector CT imaging of the chest was performed during intravenous contrast administration. CONTRAST:  25m OMNIPAQUE IOHEXOL 300 MG/ML  SOLN COMPARISON:  Cardiac CTA 12/05/2017 FINDINGS: Cardiovascular: Small nonobstructive filling defect in the distal aspect of the left main pulmonary artery (axial image 63 of series 2), compatible with a nonobstructive pulmonary embolism. Heart size is normal. There is no significant pericardial fluid, thickening or pericardial calcification. There is aortic atherosclerosis, as well as atherosclerosis of the great vessels of the mediastinum and the coronary arteries, including calcified atherosclerotic plaque in the left main and left anterior descending coronary arteries. Calcifications of the aortic valve. Mediastinum/Nodes: No pathologically enlarged mediastinal or hilar lymph nodes. Esophagus is unremarkable in appearance. No axillary lymphadenopathy. Lungs/Pleura: Previously noted elongated mass in the left lower lobe is similar to the recent prior examination currently measuring 3.6 x  1.7 x 1.8 cm (coronal image 69 of series 5 and axial image 112 of series 7). Several other smaller pulmonary nodules are scattered throughout the lungs bilaterally, many of which are somewhat elongated and irregular in shape. No acute consolidative airspace disease. No pleural effusions. Upper Abdomen: Diffuse  low attenuation throughout the visualized hepatic parenchyma, indicative of hepatic steatosis. Calcified granuloma in the liver incidentally noted. 3.7 x 2.8 cm low-attenuation lesion in the right kidney is incompletely imaged, but likely represents a simple cyst. Aortic atherosclerosis. Musculoskeletal: There are no aggressive appearing lytic or blastic lesions noted in the visualized portions of the skeleton. IMPRESSION: 1. In addition to the previously noted mass in the left lower lobe there several other smaller irregular-shaped nodules scattered throughout the lungs bilaterally. The irregular shape of the nodules would be unusual for metastatic disease. The possibility of a benign etiology such as multifocal bronchoceles should be considered, but is a diagnosis of exclusion. Malignancy should be excluded, and further evaluation with PET-CT is again suggested. 2. Nonocclusive filling defect in the distal left main pulmonary artery compatible with pulmonary embolism. 3. Hepatic steatosis. 4. Aortic atherosclerosis, in addition to left main and left anterior descending coronary artery disease. Assessment for potential risk factor modification, dietary therapy or pharmacologic therapy may be warranted, if clinically indicated. 5. There are calcifications of the aortic valve. Echocardiographic correlation for evaluation of potential valvular dysfunction may be warranted if clinically indicated. These results will be called to the ordering clinician or representative by the Radiologist Assistant, and communication documented in the PACS or zVision Dashboard. Aortic Atherosclerosis (ICD10-I70.0). Electronically  Signed   By: Vinnie Langton M.D.   On: 12/23/2017 20:34   EKG Interpretation  Date/Time:  Saturday December 23 2017 23:23:11 EST Ventricular Rate:  70 PR Interval:    QRS Duration: 106 QT Interval:  412 QTC Calculation: 445 R Axis:   -8 Text Interpretation:  Sinus rhythm Low voltage, precordial leads No significant change since last tracing Confirmed by Deno Etienne [phone]) on 12/23/2017 11:50:46 PM   Pending Labs Unresulted Labs (From admission, onward)   None      Vitals/Pain Today's Vitals   12/23/17 2309 12/24/17 0030 12/24/17 0100 12/24/17 0300  BP: (!) 166/88 135/72 (!) 184/94 (!) 151/84  Pulse: (!) 59 64 65 63  Resp: (!) '8 12 15 12  ' Temp:      TempSrc:      SpO2: 99% 92% 98% 96%  PainSc:        Isolation Precautions No active isolations  Medications Medications  irbesartan (AVAPRO) tablet 150 mg (150 mg Oral Given 12/24/17 0303)  nebivolol (BYSTOLIC) tablet 2.5 mg (2.5 mg Oral Not Given 12/24/17 0303)  pantoprazole (PROTONIX) EC tablet 40 mg (has no administration in time range)  Vitamin D3 TABS 2,000 Units (2,000 Units Oral Not Given 12/24/17 0304)  multivitamin with minerals tablet 1 tablet (1 tablet Oral Not Given 12/24/17 0304)  loratadine (CLARITIN) tablet 10 mg (has no administration in time range)  oxymetazoline (AFRIN) 0.05 % nasal spray 1 spray (has no administration in time range)  acetaminophen (TYLENOL) tablet 650 mg (has no administration in time range)    Or  acetaminophen (TYLENOL) suppository 650 mg (has no administration in time range)  insulin aspart (novoLOG) injection 0-9 Units (has no administration in time range)  enoxaparin (LOVENOX) injection 65 mg (has no administration in time range)    Mobility walks

## 2017-12-24 NOTE — H&P (Signed)
History and Physical    [name] QTM:[id number] DOB: August 21, 1945 DOA: 12/23/2017  PCP: [name], MD Patient coming from: Home  Chief Complaint: Dyspnea on exertion  HPI: [name] is a 72 y.o. female with medical history significant of asthma, type 2 diabetes, hypertension, hyperlipidemia, history of left breast cancer presenting with a chief complaint of dyspnea on exertion.  Patient had an outpatient CT with contrast done to evaluate for a right lung mass and the study revealed an incidental finding of a PE.  She was called this evening and sent here for admission. Patient reports having dyspnea on exertion for several months.  Denies having any wheezing or chest pain.  Denies having any fevers or chills.  Denies having any calf pain, swelling, or erythema.  Denies history of blood clots.  No recent travel.  She is a former smoker; quit in 1976.  ED Course: Hemodynamically stable.  Satting well on room air.  No leukocytosis.  BNP pending.  I-STAT troponin negative and EKG not suggestive of ACS.  ED physician spoke to Dr. Benay Spice who advised starting Lovenox and admitting the patient.  Oncology will see the patient in the morning.   Review of Systems: As per HPI otherwise 10 point review of systems negative.  Past Medical History:  Diagnosis Date  . ALLERGIC RHINITIS   . ANEMIA-NOS   . ASTHMA   . Carpal tunnel syndrome   . COLONIC POLYPS, HX OF   . DIABETES MELLITUS, TYPE II    diet controlled  . GERD   . HYPERLIPIDEMIA   . HYPERTENSION   . OSTEOPENIA   . Psoriasis    severe, began soriatane 01/2012  . Rectal fissure   . Scoliosis     Past Surgical History:  Procedure Laterality Date  . benign rectal growth  2004   removed by Dr. Zella Richer  . BREAST BIOPSY Right   . BREAST EXCISIONAL BIOPSY Left   . BREAST SURGERY  1988   biopsy  . CESAREAN SECTION       reports that she quit smoking about 43 years ago. She has never used smokeless tobacco.  She reports that she does not drink alcohol or use drugs.  Allergies  Allergen Reactions  . Amlodipine     swelling  . Clarithromycin     Nausea, I can pass out   . Codeine     Nausea, I can pass out   . Erythromycin     Other reaction(s): Vomiting (intolerance)  . Statins     Muscle pain   . Tetracycline     Family History  Problem Relation Age of Onset  . Lung cancer Father   . Arthritis Other        Parents  . Asthma Other        parent, other relative  . Breast cancer Other        other relative  . Hypertension Other        parent, other relative  . Heart disease Other        parent, other relative  . Breast cancer Maternal Aunt 68    Prior to Admission medications   Medication Sig Start Date End Date Taking? Authorizing Provider  BENICAR 20 MG tablet TAKE ONE (1) TABLET BY MOUTH TWO (2) TIMES DAILY 12/04/17   Burns, Claudina Lick, MD  blood glucose meter kit and supplies KIT Dispense based on patient and insurance preference. ONE TOUCH ULTRA 05/01/17   Burns,  Claudina Lick, MD  BYSTOLIC 2.5 MG tablet TAKE ONE (1) TABLET BY MOUTH EVERY DAY 12/04/17   [name], MD  Cholecalciferol (VITAMIN D3) 2000 units TABS Take 2,000 Units by mouth at bedtime.     [provider]  clindamycin (CLEOCIN T) 1 % external solution Apply 1 application topically as needed. 09/13/17   [provider]  clobetasol (TEMOVATE) 0.05 % external solution Apply 1 application topically as needed. 04/17/17 04/17/18  [provider]  Coenzyme Q10 (COQ10) 200 MG CAPS Take 1 capsule by mouth at bedtime. Gummies    [provider]  doxylamine, Sleep, (UNISOM) 25 MG tablet Take 12.5 mg by mouth at bedtime as needed. Take 1/2 a tablet (12.5 mg) by mouth daily at bedtime as needed for insomnia.    [provider]  ENBREL SURECLICK 50 MG/ML injection Inject into the skin once a week. Once a week sometimes on sundays or mondays. Patient unsure of dose 12/05/12   [provider]  Flurandrenolide [facility]) 4 MCG/SQCM TAPE Apply 4 each topically as needed.    [provider]  glucose blood (ONE TOUCH ULTRA TEST) test strip Use test strip to check blood sugar at least 3 times 05/01/17   [name], MD  halobetasol (ULTRAVATE) 0.05 % cream Apply 1 application topically daily. 10/24/16   [provider]  ibuprofen (ADVIL,MOTRIN) 200 MG tablet Take 200 mg by mouth every 6 (six) hours as needed for mild pain.    [provider]  Lancets [facility] ULTRASOFT) lancets Use lancet to test blood sugar at least twice a day or as directed by your physician. 05/01/17   [name], MD  loratadine (CLARITIN REDITABS) 10 MG dissolvable tablet Take 10 mg by mouth every other day as needed for allergies.    [provider]  metoprolol tartrate (LOPRESSOR) 50 MG tablet Take 1 tablet (50 mg total) by mouth once for 1 dose. Take 1 hour prior to your Coronary CT. 10/13/17 10/13/17  Dorothy Spark, MD  Multiple Vitamin (MULTIVITAMIN) tablet Take 1 tablet by mouth at bedtime.     [provider]  omeprazole (PRILOSEC) 20 MG capsule Take 20 mg by mouth daily. OTC    [provider]  oxymetazoline (AFRIN 12 HOUR) 0.05 % nasal spray Place 1 spray into the nose 2 (two) times daily.     [provider]  Pediatric Multiple Vit-C-FA (MULTIVITAMIN ANIMAL SHAPES, WITH CA/FA,) with C & FA chewable tablet Chew by mouth.    [provider]  Pitavastatin Calcium 1 MG TABS Take 1 tablet (1 mg total) by mouth daily. Patient taking differently: Take 1 mg by mouth every other day.  07/31/17   [name], MD    Physical Exam: Vitals:   12/23/17 2237 12/23/17 2309 12/24/17 0030 12/24/17 0100  BP: (!) 186/82 (!) 166/88 135/72 (!) 184/94  Pulse: 61 (!) 59 64 65  Resp: 18 (!) _0 Temp: 97.7 F (36.5 C)     TempSrc: Oral     SpO2: 99% 99% 92% 98%    Physical Exam  Constitutional: She is oriented to person,  place, and time. She appears well-developed and well-nourished. No distress.  Sitting up comfortably in a hospital stretcher  HENT:  Head: Normocephalic and atraumatic.  Mouth/Throat: Oropharynx is clear and moist.  Eyes: Right eye exhibits no discharge. Left eye exhibits no discharge.  Neck: Neck supple. No tracheal deviation present.  Cardiovascular: Normal  rate, regular rhythm and intact distal pulses.  Pulmonary/Chest: Effort normal and breath sounds normal. No respiratory distress. She has no wheezes. She has no rales.  Abdominal: Soft. Bowel sounds are normal. She exhibits no distension. There is no tenderness.  Musculoskeletal: She exhibits no edema.  Calves appear symmetrical.  No erythema, increased warmth, or tenderness.  Neurological: She is alert and oriented to person, place, and time.  Skin: Skin is warm and dry. She is not diaphoretic.  Psychiatric: Her behavior is normal.     Labs on Admission: I have personally reviewed following labs and imaging studies  CBC: Recent Labs  Lab 12/19/17 1327 12/23/17 2339  WBC 7.8 8.0  NEUTROABS 4.4 3.7  HGB 12.1 13.7  HCT 37.7 43.0  MCV 96.2 97.7  PLT 253 709   Basic Metabolic Panel: Recent Labs  Lab 12/19/17 1327 12/23/17 2339  NA 141 141  K 4.1 3.8  CL 107 105  CO2 26 25  GLUCOSE 106* 99  BUN 16 25*  CREATININE 0.96 0.97  CALCIUM 9.8 9.6   GFR: Estimated Creatinine Clearance: 44 mL/min (by C-G formula based on SCr of 0.97 mg/dL). Liver Function Tests: Recent Labs  Lab 12/19/17 1327  AST 18  ALT 23  ALKPHOS 73  BILITOT 0.5  PROT 7.4  ALBUMIN 3.7   No results for input(s): LIPASE, AMYLASE in the last 168 hours. No results for input(s): AMMONIA in the last 168 hours. Coagulation Profile: No results for input(s): INR, PROTIME in the last 168 hours. Cardiac Enzymes: No results for input(s): CKTOTAL, CKMB, CKMBINDEX, TROPONINI in the last 168 hours. BNP (last 3 results) No results for input(s): PROBNP in  the last 8760 hours. HbA1C: No results for input(s): HGBA1C in the last 72 hours. CBG: No results for input(s): GLUCAP in the last 168 hours. Lipid Profile: No results for input(s): CHOL, HDL, LDLCALC, TRIG, CHOLHDL, LDLDIRECT in the last 72 hours. Thyroid Function Tests: No results for input(s): TSH, T4TOTAL, FREET4, T3FREE, THYROIDAB in the last 72 hours. Anemia Panel: No results for input(s): VITAMINB12, FOLATE, FERRITIN, TIBC, IRON, RETICCTPCT in the last 72 hours. Urine analysis:    Component Value Date/Time   COLORURINE LT. YELLOW 10/13/2009 0843   APPEARANCEUR CLEAR 10/13/2009 0843   LABSPEC <=1.005 10/13/2009 0843   PHURINE 6.0 10/13/2009 0843   GLUCOSEU NEGATIVE 10/13/2009 0843   BILIRUBINUR NEGATIVE 10/13/2009 0843   KETONESUR NEGATIVE 10/13/2009 0843   UROBILINOGEN 0.2 10/13/2009 0843   NITRITE NEGATIVE 10/13/2009 0843   LEUKOCYTESUR NEGATIVE 10/13/2009 0843    Radiological Exams on Admission: Ct Chest W Contrast  Result Date: 12/23/2017 CLINICAL DATA:  72 year old female with history of cough for the past 8-10 months. Shortness of breath for the past 4-5 months. EXAM: CT CHEST WITH CONTRAST TECHNIQUE: Multidetector CT imaging of the chest was performed during intravenous contrast administration. CONTRAST:  75m OMNIPAQUE IOHEXOL 300 MG/ML  SOLN COMPARISON:  Cardiac CTA 12/05/2017 FINDINGS: Cardiovascular: Small nonobstructive filling defect in the distal aspect of the left main pulmonary artery (axial image 63 of series 2), compatible with a nonobstructive pulmonary embolism. Heart size is normal. There is no significant pericardial fluid, thickening or pericardial calcification. There is aortic atherosclerosis, as well as atherosclerosis of the great vessels of the mediastinum and the coronary arteries, including calcified atherosclerotic plaque in the left main and left anterior descending coronary arteries. Calcifications of the aortic valve. Mediastinum/Nodes: No  pathologically enlarged mediastinal or hilar lymph nodes. Esophagus is unremarkable in appearance. No axillary  lymphadenopathy. Lungs/Pleura: Previously noted elongated mass in the left lower lobe is similar to the recent prior examination currently measuring 3.6 x 1.7 x 1.8 cm (coronal image 69 of series 5 and axial image 112 of series 7). Several other smaller pulmonary nodules are scattered throughout the lungs bilaterally, many of which are somewhat elongated and irregular in shape. No acute consolidative airspace disease. No pleural effusions. Upper Abdomen: Diffuse low attenuation throughout the visualized hepatic parenchyma, indicative of hepatic steatosis. Calcified granuloma in the liver incidentally noted. 3.7 x 2.8 cm low-attenuation lesion in the right kidney is incompletely imaged, but likely represents a simple cyst. Aortic atherosclerosis. Musculoskeletal: There are no aggressive appearing lytic or blastic lesions noted in the visualized portions of the skeleton. IMPRESSION: 1. In addition to the previously noted mass in the left lower lobe there several other smaller irregular-shaped nodules scattered throughout the lungs bilaterally. The irregular shape of the nodules would be unusual for metastatic disease. The possibility of a benign etiology such as multifocal bronchoceles should be considered, but is a diagnosis of exclusion. Malignancy should be excluded, and further evaluation with PET-CT is again suggested. 2. Nonocclusive filling defect in the distal left main pulmonary artery compatible with pulmonary embolism. 3. Hepatic steatosis. 4. Aortic atherosclerosis, in addition to left main and left anterior descending coronary artery disease. Assessment for potential risk factor modification, dietary therapy or pharmacologic therapy may be warranted, if clinically indicated. 5. There are calcifications of the aortic valve. Echocardiographic correlation for evaluation of potential valvular  dysfunction may be warranted if clinically indicated. These results will be called to the ordering clinician or representative by the Radiologist Assistant, and communication documented in the PACS or zVision Dashboard. Aortic Atherosclerosis (ICD10-I70.0). Electronically Signed   By: Vinnie Langton M.D.   On: 12/23/2017 20:34    EKG: Independently reviewed.  Sinus rhythm-heart rate 70.  No significant change since prior tracing.  Assessment/Plan Principal Problem:   Pulmonary embolism (HCC) Active Problems:   Diabetes type 2, controlled ([place])   HTN (hypertension)   GERD (gastroesophageal reflux disease)   Lung mass   HLD (hyperlipidemia)   Pulmonary embolism Likely related to suspected underlying lung malignancy.  CT chest done on November 8 for evaluation of lung lesion showing incidental finding of PE (nonocclusive filling defect in the distal left main pulmonary artery).  Patient is hemodynamically stable.  Satting well on room air and no respiratory distress.  I-STAT troponin negative.  EKG showing sinus rhythm.  ED physician spoke to Dr. Benay Spice who advised starting Lovenox and admitting the patient. -Lovenox per pharmacy -Cardiac monitoring  Lung mass Patient was found to have an incidental finding of a left lower lobe lung mass suspicious for lung cancer on CT scan of the coronaries.  She was seen by Dr. Ivin Poot on December 19, 2017 and a CT of the chest with contrast was ordered for evaluation of this left lower lung lesion.  CT chest done on November 8 showing "In addition to the previously noted mass in the left lower lobe there several other smaller irregular-shaped nodules scattered throughout the lungs bilaterally. The irregular shape of the nodules would be unusual for metastatic disease. The possibility of a benign etiology such as multifocal bronchoceles should be considered, but is a diagnosis of exclusion. Malignancy should be excluded, and further evaluation with PET-CT is  again suggested." -Patient will be seen by oncology in the morning  Hypertension -Currently hypertensive.  Continue home medications.  Hyperlipidemia -History of statin  intolerance.  Currently taking Pivastatin every other day at home which is not available on the hospital formulary.  Well-controlled type 2 diabetes -A1c 6.3 on October 30, 2017.  Currently not on any home medications. -Sliding scale insulin sensitive -CBG checks  GERD -Continue PPI  DVT prophylaxis: Lovenox Code Status: Patient wishes to be full code. Family Communication: Family at bedside updated. Disposition Plan: Anticipate discharge to home in 1 day. Consults called: Oncology- Dr. Benay Spice  Admission status: Observation  Shela Leff MD [facility] Pager [phone]  If 7PM-7AM, please contact night-coverage [email or web address] Password [email or web address]  12/24/2017, 1:38 AM

## 2017-12-24 NOTE — Discharge Summary (Signed)
Physician Discharge Summary  [name] MLY:[id number] DOB: 1945/05/01 DOA: 12/23/2017  PCP: [name], MD  Admit date: 12/23/2017 Discharge date: 12/24/2017  Admitted From: Home  Disposition:  Home   Recommendations for Outpatient Follow-up:  1. Follow up with PCP in 1-2 weeks 2. Follow up with Oncology in 1 week 3. Please obtain CBC in 2 weeks    Home Health: None  Equipment/Devices: None  Discharge Condition: Good  CODE STATUS: FULL Diet recommendation: Diabetic  Brief/Interim Summary: [name] is a 72 y.o. F with asthma, DM, HTN and left breast cancer as well as recently discovered lung mass who presented with incidental finding of pulmonary embolism.  The patient had a CT chest with contrast ordered yesterday as part of her work up for lung mass and in the read for this CT, was found an incidental non-occlusive left main pulmonary artery embolism.       PRINCIPAL HOSPITAL DIAGNOSIS: Pulmonary embolism    Discharge Diagnoses:   Acute pulmonary embolism Acuity unclear.  Case was discussed with Dr. Benay Spice by phone who recommended Xarelto or Lovenox per patient preference.  Would prefer to start anticoagulation for 2-3 weeks before biopsy if able.  She agreed to start Xarelto.  Has close follow up with Dr. Earlie Server.  Patient had many questions about bleeding risk, as she has frail skin and easy bruising and is worried about superficial scratches and bleeding, but also more serious bleeding risk and reversal agents.         Discharge Instructions  Discharge Instructions    Discharge instructions   Complete by:  As directed    From Dr. Loleta Books: You were admitted to start a blood thinner for the blood clot in your lungs.  You were started on Lovenox, and we have transitioned to Xarelto. Take Xarelto 15 mg twice daily for 21 days then transition to 20 mg once nightly on day 22 (this would be Sunday Dec 1)  If you haven't heard from Dr.  Lew Dawes office by Tuesday, call them at [phone]  If you have skin cuts that bleed longer than usual with the Xarelto, use pressure to stop the bleeding. If you have a large amount of red blood in your bowel movements, if you have black and tarry bowel movements, or if you have dizziness, weakness and fatigue, seek care immediately.   Increase activity slowly   Complete by:  As directed      Allergies as of 12/24/2017      Reactions   Amlodipine    swelling   Clarithromycin    Nausea, I can pass out    Codeine    Nausea, I can pass out    Erythromycin    Other reaction(s): Vomiting (intolerance)   Statins    Muscle pain    Tetracycline       Medication List    TAKE these medications   AFRIN 12 HOUR 0.05 % nasal spray Generic drug:  oxymetazoline Place 1 spray into the nose 2 (two) times daily.   BENICAR 20 MG tablet Generic drug:  olmesartan TAKE ONE (1) TABLET BY MOUTH TWO (2) TIMES DAILY What changed:  See the new instructions.   blood glucose meter kit and supplies Kit Dispense based on patient and insurance preference. ONE TOUCH ULTRA   BYSTOLIC 2.5 MG tablet Generic drug:  nebivolol TAKE ONE (1) TABLET BY MOUTH EVERY DAY What changed:  See the new instructions.   clindamycin 1 % external solution Commonly  known as:  CLEOCIN T Apply 1 application topically daily as needed (cuts).   clobetasol 0.05 % external solution Commonly known as:  TEMOVATE Apply 1 application topically daily.   CoQ10 200 MG Caps Take 1 capsule by mouth at bedtime. Gummies   ENBREL SURECLICK 50 MG/ML injection Generic drug:  etanercept Inject into the skin once a week. Mondays. Patient unsure of dose   glucose blood test strip Use test strip to check blood sugar at least 3 times   halobetasol 0.05 % cream Commonly known as:  ULTRAVATE Apply 1 application topically daily.   ibuprofen 200 MG tablet Commonly known as:  ADVIL,MOTRIN Take 200 mg by mouth every 6 (six)  hours as needed for mild pain.   loratadine 10 MG dissolvable tablet Commonly known as:  CLARITIN REDITABS Take 10 mg by mouth every other day as needed for allergies.   multivitamin animal shapes (with Ca/FA) with C & FA chewable tablet Chew 1 tablet by mouth at bedtime.   omeprazole 20 MG capsule Commonly known as:  PRILOSEC Take 20 mg by mouth daily. OTC   onetouch ultrasoft lancets Use lancet to test blood sugar at least twice a day or as directed by your physician.   Pitavastatin Calcium 1 MG Tabs Take 1 tablet (1 mg total) by mouth daily. What changed:  when to take this   Rivaroxaban 15 & 20 MG Tbpk Take as directed on package: Start with one 37m tablet by mouth twice a day with food. On Day 22, switch to one 279mtablet once a day with food.   UNISOM 25 MG tablet Generic drug:  doxylamine (Sleep) Take 12.5 mg by mouth at bedtime as needed for sleep.   Vitamin D3 50 MCG (2000 UT) Tabs Take 2,000 Units by mouth at bedtime.       Allergies  Allergen Reactions  . Amlodipine     swelling  . Clarithromycin     Nausea, I can pass out   . Codeine     Nausea, I can pass out   . Erythromycin     Other reaction(s): Vomiting (intolerance)  . Statins     Muscle pain   . Tetracycline     Consultations:  Oncology   Procedures/Studies: Ct Chest W Contrast  Result Date: 12/23/2017 CLINICAL DATA:  [age]ear old female with history of cough for the past 8-10 months. Shortness of breath for the past 4-5 months. EXAM: CT CHEST WITH CONTRAST TECHNIQUE: Multidetector CT imaging of the chest was performed during intravenous contrast administration. CONTRAST:  7569mMNIPAQUE IOHEXOL 300 MG/ML  SOLN COMPARISON:  Cardiac CTA 12/05/2017 FINDINGS: Cardiovascular: Small nonobstructive filling defect in the distal aspect of the left main pulmonary artery (axial image 63 of series 2), compatible with a nonobstructive pulmonary embolism. Heart size is normal. There is no significant  pericardial fluid, thickening or pericardial calcification. There is aortic atherosclerosis, as well as atherosclerosis of the great vessels of the mediastinum and the coronary arteries, including calcified atherosclerotic plaque in the left main and left anterior descending coronary arteries. Calcifications of the aortic valve. Mediastinum/Nodes: No pathologically enlarged mediastinal or hilar lymph nodes. Esophagus is unremarkable in appearance. No axillary lymphadenopathy. Lungs/Pleura: Previously noted elongated mass in the left lower lobe is similar to the recent prior examination currently measuring 3.6 x 1.7 x 1.8 cm (coronal image 69 of series 5 and axial image 112 of series 7). Several other smaller pulmonary nodules are scattered throughout the lungs bilaterally, many of which  are somewhat elongated and irregular in shape. No acute consolidative airspace disease. No pleural effusions. Upper Abdomen: Diffuse low attenuation throughout the visualized hepatic parenchyma, indicative of hepatic steatosis. Calcified granuloma in the liver incidentally noted. 3.7 x 2.8 cm low-attenuation lesion in the right kidney is incompletely imaged, but likely represents a simple cyst. Aortic atherosclerosis. Musculoskeletal: There are no aggressive appearing lytic or blastic lesions noted in the visualized portions of the skeleton. IMPRESSION: 1. In addition to the previously noted mass in the left lower lobe there several other smaller irregular-shaped nodules scattered throughout the lungs bilaterally. The irregular shape of the nodules would be unusual for metastatic disease. The possibility of a benign etiology such as multifocal bronchoceles should be considered, but is a diagnosis of exclusion. Malignancy should be excluded, and further evaluation with PET-CT is again suggested. 2. Nonocclusive filling defect in the distal left main pulmonary artery compatible with pulmonary embolism. 3. Hepatic steatosis. 4. Aortic  atherosclerosis, in addition to left main and left anterior descending coronary artery disease. Assessment for potential risk factor modification, dietary therapy or pharmacologic therapy may be warranted, if clinically indicated. 5. There are calcifications of the aortic valve. Echocardiographic correlation for evaluation of potential valvular dysfunction may be warranted if clinically indicated. These results will be called to the ordering clinician or representative by the Radiologist Assistant, and communication documented in the PACS or zVision Dashboard. Aortic Atherosclerosis (ICD10-I70.0). Electronically Signed   By: Vinnie Langton M.D.   On: 12/23/2017 20:34   Ct Coronary Morph W/cta Cor W/score W/ca W/cm &/or Wo/cm  Addendum Date: 12/05/2017   ADDENDUM REPORT: 12/05/2017 13:09 CLINICAL DATA:  72 year old female with hypertension, hyperlipidemia, morbid obesity and SOB. EXAM: Cardiac/Coronary  CT TECHNIQUE: The patient was scanned on a [employer]. FINDINGS: A 120 kV prospective scan was triggered in the descending thoracic aorta at 111 HU's. Axial non-contrast 3 mm slices were carried out through the heart. The data set was analyzed on a dedicated work station and scored using the Portia. Gantry rotation speed was 250 msecs and collimation was .6 mm. No beta blockade and 0.8 mg of sl NTG was given. The 3D data set was reconstructed in 5% intervals of the 67-82 % of the R-R cycle. Diastolic phases were analyzed on a dedicated work station using MPR, MIP and VRT modes. The patient received 80 cc of contrast. Aorta:  Normal size.  Mild calcifications.  No dissection. Aortic Valve:  Trileaflet.  No calcifications. Coronary Arteries:  Normal coronary origin.  Right dominance. RCA is a large dominant artery that gives rise to PDA and PLVB. There is minimal plaque. Left main is a large artery that gives rise to LAD and LCX arteries. Left main has no plaque. LAD is a large vessel that  gives rise to one large diagonal artery. Proximal LAD has mild plaque with associated stenosis 25-50%. D1 has no significant plaque. LCX is a non-dominant artery that gives rise to one large OM1 branch. There is no plaque. Other findings: Normal pulmonary vein drainage into the left atrium. Normal let atrial appendage without a thrombus. Normal size of the pulmonary artery. IMPRESSION: 1. Coronary calcium score of 6. This was 18 percentile for age and sex matched control. 2. Normal coronary origin with right dominance. 3. There is mild CAD in the proximal LAD, aggressive risk factor modification is recommended SOM most probably secondary to obesity and deconditioning. Electronically Signed   By: Ena Dawley   On: 12/05/2017 13:09  Result Date: 12/05/2017 EXAM: OVER-READ INTERPRETATION  CT CHEST The following report is an over-read performed by radiologist Dr. Vinnie Langton of [facility], [place] on 12/05/2017. This over-read does not include interpretation of cardiac or coronary anatomy or pathology. The coronary calcium score/coronary CTA interpretation by the cardiologist is attached. COMPARISON:  None. FINDINGS: Aortic atherosclerosis. In the anterior aspect of the left lower lobe there is an elongated mass which measures 3.8 x 1.6 x 1.6 cm (coronal image 53 of series 602 and axial image 33 of series 12). Within the visualized portions of the thorax there is no acute consolidative airspace disease, no pleural effusions, no pneumothorax and no lymphadenopathy. Visualized portions of the upper abdomen are unremarkable. There are no aggressive appearing lytic or blastic lesions noted in the visualized portions of the skeleton. IMPRESSION: 1. 3.8 x 1.6 x 1.6 cm macrolobulated elongated mass in the anterior aspect of the left lower lobe. This is suspicious for potential neoplasm, and further evaluation with PET-CT is recommended in the near future. 2.  Aortic Atherosclerosis (ICD10-I70.0).  Electronically Signed: By: Vinnie Langton M.D. On: 12/05/2017 10:41       Subjective: No chest pain, no dyspnea, no leg swelling.  No confusion, fever, malaise.    Discharge Exam: Vitals:   12/24/17 0300 12/24/17 0327  BP: (!) 151/84 (!) 162/77  Pulse: 63 (!) 58  Resp: 12 14  Temp:  98 F (36.7 C)  SpO2: 96% 100%   Vitals:   12/24/17 0100 12/24/17 0300 12/24/17 0320 12/24/17 0327  BP: (!) 184/94 (!) 151/84  (!) 162/77  Pulse: 65 63  (!) 58  Resp: '15 12  14  ' Temp:    98 F (36.7 C)  TempSrc:    Oral  SpO2: 98% 96%  100%  Weight:   63.6 kg   Height:   '5\' 1"'  (1.549 m)     General: Pt is alert, awake, not in acute distress Cardiovascular: RRR, nl S1-S2, no murmurs appreciated.   No LE edema.   Respiratory: Normal respiratory rate and rhythm.  CTAB without rales or wheezes. Abdominal: Abdomen soft and non-tender.  No distension or HSM.   Neuro/Psych: Strength symmetric in upper and lower extremities.  Judgment and insight appear normal.   The results of significant diagnostics from this hospitalization (including imaging, microbiology, ancillary and laboratory) are listed below for reference.     Microbiology: No results found for this or any previous visit (from the past 240 hour(s)).   Labs: BNP (last 3 results) Recent Labs    12/23/17 2339  BNP 75.4   Basic Metabolic Panel: Recent Labs  Lab 12/19/17 1327 12/23/17 2339  NA 141 141  K 4.1 3.8  CL 107 105  CO2 26 25  GLUCOSE 106* 99  BUN 16 25*  CREATININE 0.96 0.97  CALCIUM 9.8 9.6   Liver Function Tests: Recent Labs  Lab 12/19/17 1327  AST 18  ALT 23  ALKPHOS 73  BILITOT 0.5  PROT 7.4  ALBUMIN 3.7   No results for input(s): LIPASE, AMYLASE in the last 168 hours. No results for input(s): AMMONIA in the last 168 hours. CBC: Recent Labs  Lab 12/19/17 1327 12/23/17 2339  WBC 7.8 8.0  NEUTROABS 4.4 3.7  HGB 12.1 13.7  HCT 37.7 43.0  MCV 96.2 97.7  PLT 253 277   Cardiac Enzymes: No  results for input(s): CKTOTAL, CKMB, CKMBINDEX, TROPONINI in the last 168 hours. BNP: Invalid input(s): POCBNP CBG: Recent Labs  Lab  12/24/17 0745  GLUCAP 103*   D-Dimer No results for input(s): DDIMER in the last 72 hours. Hgb A1c No results for input(s): HGBA1C in the last 72 hours. Lipid Profile No results for input(s): CHOL, HDL, LDLCALC, TRIG, CHOLHDL, LDLDIRECT in the last 72 hours. Thyroid function studies No results for input(s): TSH, T4TOTAL, T3FREE, THYROIDAB in the last 72 hours.  Invalid input(s): FREET3 Anemia work up No results for input(s): VITAMINB12, FOLATE, FERRITIN, TIBC, IRON, RETICCTPCT in the last 72 hours. Urinalysis    Component Value Date/Time   COLORURINE LT. YELLOW 10/13/2009 [place] 10/13/2009 0843   LABSPEC <=1.005 10/13/2009 0843   PHURINE 6.0 10/13/2009 0843   GLUCOSEU NEGATIVE 10/13/2009 0843   BILIRUBINUR NEGATIVE 10/13/2009 0843   KETONESUR NEGATIVE 10/13/2009 0843   UROBILINOGEN 0.2 10/13/2009 0843   NITRITE NEGATIVE 10/13/2009 0843   LEUKOCYTESUR NEGATIVE 10/13/2009 0843   Sepsis Labs Invalid input(s): PROCALCITONIN,  WBC,  LACTICIDVEN Microbiology No results found for this or any previous visit (from the past 240 hour(s)).   Time coordinating discharge: 45 minutes       SIGNED:   Edwin Dada, MD  [facility] 12/24/2017, 5:20 PM

## 2017-12-25 ENCOUNTER — Telehealth: Payer: Self-pay | Admitting: *Deleted

## 2017-12-25 ENCOUNTER — Other Ambulatory Visit: Payer: Self-pay | Admitting: Internal Medicine

## 2017-12-25 ENCOUNTER — Telehealth: Payer: Self-pay | Admitting: Medical Oncology

## 2017-12-25 DIAGNOSIS — C3490 Malignant neoplasm of unspecified part of unspecified bronchus or lung: Secondary | ICD-10-CM

## 2017-12-25 NOTE — Telephone Encounter (Signed)
Pt was on TCM report admitted 12/23/17 for PE. Per summary for pt to f/u 1-2 weeks w/PCP, but pt states she will follow-up w/her oncologist Dr. Inda Merlin. They have scheduled her to have PET SCAN, and then she will see oncologist. She states with what going on with her she doesn't feel she need to see Dr. Quay Burow. Did not schedule f/u w/Dr. Quay Burow.Marland KitchenJohny Chess

## 2017-12-25 NOTE — Telephone Encounter (Signed)
err

## 2017-12-26 ENCOUNTER — Telehealth: Payer: Self-pay | Admitting: Medical Oncology

## 2017-12-26 NOTE — Telephone Encounter (Signed)
Pt stayed overnight in ED , had Lovenox and started Xarleto 15 mg bid ( started nov 3rd ). Schedule message snet for appt next week after PET scan.

## 2017-12-26 NOTE — Telephone Encounter (Signed)
-----   Message from Caitlin Bears, MD sent at 12/25/2017  8:40 AM EST ----- Please call her to find out what was done for her in the emergency room.  I also ordered a PET scan and hopefully this will be done within the next 7-10 days.  Then I will see her for follow-up visit afterwards the scan. ----- Message ----- From: Caitlin Pier, MD Sent: 12/23/2017  10:01 PM EST To: Caitlin Bears, MD  Called by radiology at 9:45 PM on 11/9 with PE found on ct done 11/8 AM,  I called [name]. She has no resp. Symptoms. She is hesitant to start xarelto, due to bleeding risk. Does not think she can give herself an injection.  She will go to ER to discuss Lovenox with MD.  I called Dr. William Hamburger in ER  Please check on her 11/11  Thanks  Brad

## 2017-12-28 ENCOUNTER — Telehealth: Payer: Self-pay | Admitting: Internal Medicine

## 2017-12-28 NOTE — Telephone Encounter (Signed)
Called regarding 11/22

## 2018-01-01 ENCOUNTER — Ambulatory Visit (HOSPITAL_COMMUNITY)
Admission: RE | Admit: 2018-01-01 | Discharge: 2018-01-01 | Disposition: A | Discharge status: Home / Self Care | Payer: Medicare Other | Source: Ambulatory Visit | Attending: Internal Medicine | Admitting: Internal Medicine

## 2018-01-01 DIAGNOSIS — C3490 Malignant neoplasm of unspecified part of unspecified bronchus or lung: Secondary | ICD-10-CM | POA: Insufficient documentation

## 2018-01-01 LAB — GLUCOSE, CAPILLARY: Glucose-Capillary: 97 mg/dL (ref 70–99)

## 2018-01-01 MED ORDER — FLUDEOXYGLUCOSE F - 18 (FDG) INJECTION
7.7500 | Freq: Once | INTRAVENOUS | Status: AC | PRN
  Administered 2018-01-01: 7.75 via INTRAVENOUS

## 2018-01-05 ENCOUNTER — Encounter: Payer: Self-pay | Admitting: Internal Medicine

## 2018-01-05 ENCOUNTER — Inpatient Hospital Stay (HOSPITAL_BASED_OUTPATIENT_CLINIC_OR_DEPARTMENT_OTHER): Payer: Medicare Other | Admitting: Internal Medicine

## 2018-01-05 VITALS — BP 179/93 | HR 67 | Temp 98.2°F | Resp 16 | Ht 61.0 in | Wt 140.1 lb

## 2018-01-05 DIAGNOSIS — Z853 Personal history of malignant neoplasm of breast: Secondary | ICD-10-CM

## 2018-01-05 DIAGNOSIS — I2699 Other pulmonary embolism without acute cor pulmonale: Secondary | ICD-10-CM

## 2018-01-05 DIAGNOSIS — M419 Scoliosis, unspecified: Secondary | ICD-10-CM | POA: Diagnosis not present

## 2018-01-05 DIAGNOSIS — R918 Other nonspecific abnormal finding of lung field: Secondary | ICD-10-CM | POA: Diagnosis not present

## 2018-01-05 DIAGNOSIS — I10 Essential (primary) hypertension: Secondary | ICD-10-CM

## 2018-01-05 DIAGNOSIS — K219 Gastro-esophageal reflux disease without esophagitis: Secondary | ICD-10-CM

## 2018-01-05 DIAGNOSIS — E785 Hyperlipidemia, unspecified: Secondary | ICD-10-CM

## 2018-01-05 DIAGNOSIS — D649 Anemia, unspecified: Secondary | ICD-10-CM | POA: Diagnosis not present

## 2018-01-05 DIAGNOSIS — R42 Dizziness and giddiness: Secondary | ICD-10-CM

## 2018-01-05 MED ORDER — RIVAROXABAN 20 MG PO TABS: 20.0000 mg | ORAL_TABLET | Freq: Every day | ORAL | 1 refills | Status: DC

## 2018-01-05 NOTE — Progress Notes (Signed)
[place] [phone]   [phone]  OFFICE PROGRESS NOTE  Caitlin Rail, MD [name] [id number]  DIAGNOSIS:   1) Left lower lobe lung mass, rule out lung cancer. 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  PRIOR THERAPY: None  CURRENT THERAPY: Xarelto 15 mg p.o. twice daily for 3 weeks followed by 20 mg p.o. daily.  INTERVAL HISTORY: [name] 72 y.o. female returns to the clinic today for follow-up visit.  The patient is feeling fine today with no concerning complaints.  She was recently diagnosed with incidental left main pulmonary artery embolism on CT scan of the chest.  The patient denied having any current chest pain, shortness breath, cough or hemoptysis.  She denied having any fever or chills.  She has no nausea, vomiting, diarrhea or constipation.  She denied having any headache or visual changes.  She had CT scan of the chest followed by a PET scan for evaluation of left lower lobe lung mass and the patient is here today for evaluation and discussion of her scan results and treatment options.  MEDICAL HISTORY: Past Medical History:  Diagnosis Date  . ALLERGIC RHINITIS   . ANEMIA-NOS   . ASTHMA   . Carpal tunnel syndrome   . COLONIC POLYPS, HX OF   . DIABETES MELLITUS, TYPE II    diet controlled  . GERD   . HYPERLIPIDEMIA   . HYPERTENSION   . OSTEOPENIA   . Psoriasis    severe, began soriatane 01/2012  . Rectal fissure   . Scoliosis     ALLERGIES:  is allergic to amlodipine; clarithromycin; codeine; erythromycin; statins; and tetracycline.  MEDICATIONS:  Current Outpatient Medications  Medication Sig Dispense Refill  . BENICAR 20 MG tablet TAKE ONE (1) TABLET BY MOUTH TWO (2) TIMES DAILY (Patient taking differently: Take 20 mg by mouth daily. ) 180 tablet 1  . blood glucose meter kit and supplies KIT Dispense based  on patient and insurance preference. ONE TOUCH ULTRA 1 each 0  . BYSTOLIC 2.5 MG tablet TAKE ONE (1) TABLET BY MOUTH EVERY DAY (Patient taking differently: Take 2.5 mg by mouth daily after breakfast. ) 30 tablet 5  . Cholecalciferol (VITAMIN D3) 2000 units TABS Take 2,000 Units by mouth at bedtime.     . clindamycin (CLEOCIN T) 1 % external solution Apply 1 application topically daily as needed (cuts).     . clobetasol (TEMOVATE) 0.05 % external solution Apply 1 application topically daily.     . Coenzyme Q10 (COQ10) 200 MG CAPS Take 1 capsule by mouth at bedtime. Gummies    . doxylamine, Sleep, (UNISOM) 25 MG tablet Take 12.5 mg by mouth at bedtime as needed for sleep.     Scarlette Shorts SURECLICK 50 MG/ML injection Inject into the skin once a week. Mondays. Patient unsure of dose    . glucose blood (ONE TOUCH ULTRA TEST) test strip Use test strip to check blood sugar at least 3 times 300 each 1  . halobetasol (ULTRAVATE) 0.05 % cream Apply 1 application topically daily.    Marland Kitchen ibuprofen (ADVIL,MOTRIN) 200 MG tablet Take 200 mg by mouth every 6 (six) hours as needed for mild pain.    . Lancets (ONETOUCH ULTRASOFT) lancets Use lancet to test blood sugar at least twice a day or as directed by your physician. 200 each 11  . loratadine (  CLARITIN REDITABS) 10 MG dissolvable tablet Take 10 mg by mouth every other day as needed for allergies.    Marland Kitchen omeprazole (PRILOSEC) 20 MG capsule Take 20 mg by mouth daily. OTC    . oxymetazoline (AFRIN 12 HOUR) 0.05 % nasal spray Place 1 spray into the nose 2 (two) times daily.     . Pediatric Multiple Vit-C-FA (MULTIVITAMIN ANIMAL SHAPES, WITH CA/FA,) with C & FA chewable tablet Chew 1 tablet by mouth at bedtime.     . Pitavastatin Calcium 1 MG TABS Take 1 tablet (1 mg total) by mouth daily. (Patient taking differently: Take 1 mg by mouth at bedtime. ) 30 tablet 5  . Rivaroxaban 15 & 20 MG TBPK Take as directed on package: Start with one 56m tablet by mouth twice a day with  food. On Day 22, switch to one 246mtablet once a day with food. 51 each 0   No current facility-administered medications for this visit.     SURGICAL HISTORY:  Past Surgical History:  Procedure Laterality Date  . benign rectal growth  2004   removed by Dr. RoZella Richer. BREAST BIOPSY Right   . BREAST EXCISIONAL BIOPSY Left   . BREAST SURGERY  1988   biopsy  . CESAREAN SECTION      REVIEW OF SYSTEMS:  Constitutional: positive for fatigue Eyes: negative Ears, nose, mouth, throat, and face: negative Respiratory: negative Cardiovascular: negative Gastrointestinal: negative Genitourinary:negative Integument/breast: negative Hematologic/lymphatic: negative Musculoskeletal:positive for back pain Neurological: negative Behavioral/Psych: negative Endocrine: negative Allergic/Immunologic: negative   PHYSICAL EXAMINATION: General appearance: alert, cooperative and no distress Head: Normocephalic, without obvious abnormality, atraumatic Neck: no adenopathy, no JVD, supple, symmetrical, trachea midline and thyroid not enlarged, symmetric, no tenderness/mass/nodules Lymph nodes: Cervical, supraclavicular, and axillary nodes normal. Resp: clear to auscultation bilaterally Back: symmetric, no curvature. ROM normal. No CVA tenderness. Cardio: regular rate and rhythm, S1, S2 normal, no murmur, click, rub or gallop GI: soft, non-tender; bowel sounds normal; no masses,  no organomegaly Extremities: extremities normal, atraumatic, no cyanosis or edema Neurologic: Alert and oriented X 3, normal strength and tone. Normal symmetric reflexes. Normal coordination and gait  ECOG PERFORMANCE STATUS: 1 - Symptomatic but completely ambulatory  Blood pressure (!) 179/93, pulse 67, temperature 98.2 F (36.8 C), temperature source Oral, resp. rate 16, height 5' 1" (1.549 m), weight 140 lb 1.6 oz (63.5 kg), SpO2 100 %.  LABORATORY DATA: Lab Results  Component Value Date   WBC 8.0 12/23/2017   HGB  13.7 12/23/2017   HCT 43.0 12/23/2017   MCV 97.7 12/23/2017   PLT 277 12/23/2017      Chemistry      Component Value Date/Time   NA 141 12/23/2017 2339   NA 141 11/28/2017 0833   K 3.8 12/23/2017 2339   CL 105 12/23/2017 2339   CO2 25 12/23/2017 2339   BUN 25 (H) 12/23/2017 2339   BUN 16 11/28/2017 0833   CREATININE 0.97 12/23/2017 2339   CREATININE 0.96 12/19/2017 1327      Component Value Date/Time   CALCIUM 9.6 12/23/2017 2339   ALKPHOS 73 12/19/2017 1327   AST 18 12/19/2017 1327   ALT 23 12/19/2017 1327   BILITOT 0.5 12/19/2017 1327       RADIOGRAPHIC STUDIES: Ct Chest W Contrast  Result Date: 12/23/2017 CLINICAL DATA:  [age]ear old female with history of cough for the past 8-10 months. Shortness of breath for the past 4-5 months. EXAM: CT CHEST WITH CONTRAST TECHNIQUE: Multidetector CT  imaging of the chest was performed during intravenous contrast administration. CONTRAST:  47m OMNIPAQUE IOHEXOL 300 MG/ML  SOLN COMPARISON:  Cardiac CTA 12/05/2017 FINDINGS: Cardiovascular: Small nonobstructive filling defect in the distal aspect of the left main pulmonary artery (axial image 63 of series 2), compatible with a nonobstructive pulmonary embolism. Heart size is normal. There is no significant pericardial fluid, thickening or pericardial calcification. There is aortic atherosclerosis, as well as atherosclerosis of the great vessels of the mediastinum and the coronary arteries, including calcified atherosclerotic plaque in the left main and left anterior descending coronary arteries. Calcifications of the aortic valve. Mediastinum/Nodes: No pathologically enlarged mediastinal or hilar lymph nodes. Esophagus is unremarkable in appearance. No axillary lymphadenopathy. Lungs/Pleura: Previously noted elongated mass in the left lower lobe is similar to the recent prior examination currently measuring 3.6 x 1.7 x 1.8 cm (coronal image 69 of series 5 and axial image 112 of series 7). Several  other smaller pulmonary nodules are scattered throughout the lungs bilaterally, many of which are somewhat elongated and irregular in shape. No acute consolidative airspace disease. No pleural effusions. Upper Abdomen: Diffuse low attenuation throughout the visualized hepatic parenchyma, indicative of hepatic steatosis. Calcified granuloma in the liver incidentally noted. 3.7 x 2.8 cm low-attenuation lesion in the right kidney is incompletely imaged, but likely represents a simple cyst. Aortic atherosclerosis. Musculoskeletal: There are no aggressive appearing lytic or blastic lesions noted in the visualized portions of the skeleton. IMPRESSION: 1. In addition to the previously noted mass in the left lower lobe there several other smaller irregular-shaped nodules scattered throughout the lungs bilaterally. The irregular shape of the nodules would be unusual for metastatic disease. The possibility of a benign etiology such as multifocal bronchoceles should be considered, but is a diagnosis of exclusion. Malignancy should be excluded, and further evaluation with PET-CT is again suggested. 2. Nonocclusive filling defect in the distal left main pulmonary artery compatible with pulmonary embolism. 3. Hepatic steatosis. 4. Aortic atherosclerosis, in addition to left main and left anterior descending coronary artery disease. Assessment for potential risk factor modification, dietary therapy or pharmacologic therapy may be warranted, if clinically indicated. 5. There are calcifications of the aortic valve. Echocardiographic correlation for evaluation of potential valvular dysfunction may be warranted if clinically indicated. These results will be called to the ordering clinician or representative by the Radiologist Assistant, and communication documented in the PACS or zVision Dashboard. Aortic Atherosclerosis (ICD10-I70.0). Electronically Signed   By: DVinnie LangtonM.D.   On: 12/23/2017 20:34   Nm Pet Image Initial  (pi) Skull Base To Thigh  Result Date: 01/01/2018 CLINICAL DATA:  Initial treatment strategy for lung cancer. EXAM: NUCLEAR MEDICINE PET SKULL BASE TO THIGH TECHNIQUE: 7.8 mCi F-18 FDG was injected intravenously. Full-ring PET imaging was performed from the skull base to thigh after the radiotracer. CT data was obtained and used for attenuation correction and anatomic localization. Fasting blood glucose: 97 mg/dl COMPARISON:  Multiple exams, including 12/22/2017 FINDINGS: Mediastinal blood pool activity: SUV max 2.8 NECK: Bilateral tonsillar activity without CT correlate, maximum SUV 9.6 on the right and 7.9 on the left. Incidental CT findings: Bilateral common carotid atherosclerotic calcification. CHEST: The branching nodularity posteriorly in the right upper lobe on image 16/8 has maximum SUV of 1.7. The nodularity inferiorly in the right lower lobe on image 25/8 has maximum SUV of 0.8. The subtle nodularity along the right hemidiaphragm in the right middle lobe is not appreciably hypermetabolic. The triangular density anteriorly in the left lower  lobe is not appreciably hypermetabolic, maximum SUV 0.5. The mild nodularity anteriorly in the left lower lobe currently shown on image 29/8 has a maximum SUV of 0.9. The left paramediastinal nodularity shown on image 18/8 has a maximum SUV of 1.6. Incidental CT findings: Aortic and branch vessel atherosclerotic calcification. Mild aortic valve calcification. Small hiatal hernia contains adipose tissue and a small portion of the stomach. ABDOMEN/PELVIS: Mildly complex but photopenic cyst of the right mid kidney laterally. Activity in the sigmoid colon without CT correlate, believed to probably be physiologic. Incidental CT findings: Punctate and likely postinflammatory calcification anteriorly along the liver margin, image 87/4. Dependent density in the gallbladder could be from sludge or gallstones. Fatty atrophy of the pancreas. Aortoiliac atherosclerotic vascular  disease. SKELETON: No significant abnormal hypermetabolic activity in this region. Incidental CT findings: Levoconvex lumbar scoliosis with rotary component. IMPRESSION: 1. For the most part the branching nodularity in the lungs is of low activity and likely benign. Given the somewhat branching tubular appearance, the possibility of multifocal bronchocele is certainly raised. The lesion along the left upper lobe paramediastinal area, and in the right upper lobe just adjacent to the fissure do have some low-grade activity (1.6 and 1.7, respectively) which may well be inflammatory but for which surveillance imaging in 6 months time would be suggested. 2. The larger lesion in the left lower lobe is not hypermetabolic and is likely benign. Bilateral tonsillar activity in the neck is likely benign. 3. No worrisome findings in the neck, abdomen/pelvis, or skeleton. 4. Other imaging findings of potential clinical significance: Aortic Atherosclerosis (ICD10-I70.0). Mild aortic valve calcification. Small hiatal hernia. Levoconvex lumbar scoliosis with rotary component. Possible cholelithiasis. Electronically Signed   By: Van Clines M.D.   On: 01/01/2018 15:34    ASSESSMENT AND PLAN: This is a very pleasant 72 years old white female who was seen for evaluation of left lower lobe lung mass in addition to multiple pulmonary nodules concerning for lung cancer.  The patient was also incidentally diagnosed with acute pulmonary embolism and currently on treatment with Xarelto. I order several studies recently including CT scan of the chest followed by PET scan for evaluation of the lung mass.  I personally and independently reviewed the scan images and discussed the results with the patient and her family.  Her PET scan showed no concerning hypermetabolic activity and the left lower lobe lesion or the pulmonary nodules.  These findings are more suggestive of benign etiology and could be inflammatory in origin. I  recommended for the patient to continue on observation with her primary care physician and probably repeat CT scan of the chest in 6 months. I will also refer the patient to pulmonary medicine for evaluation of her condition and further recommendation regarding the lung mass and pulmonary nodules. For the acute pulmonary embolism she will continue her current treatment with Xarelto and I gave the patient a refill for the next 2 months.  She was advised to get any further refills from her primary care physician since I am not seeing the patient at regular basis at this point. She was advised to call immediately if there is any other concerning findings. For hypertension she was very anxious today about her scan results and the patient was advised to take her blood pressure medication at regular basis and to consult with her primary care physician for adjustment of her medication if needed. The patient voices understanding of current disease status and treatment options and is in agreement with  the current care plan.  All questions were answered. The patient knows to call the clinic with any problems, questions or concerns. We can certainly see the patient much sooner if necessary.  I spent 15 minutes counseling the patient face to face. The total time spent in the appointment was 25 minutes.  Disclaimer: This note was dictated with voice recognition software. Similar sounding words can inadvertently be transcribed and may not be corrected upon review.

## 2018-01-10 ENCOUNTER — Ambulatory Visit (INDEPENDENT_AMBULATORY_CARE_PROVIDER_SITE_OTHER): Payer: Medicare Other | Admitting: Internal Medicine

## 2018-01-10 ENCOUNTER — Encounter: Payer: Self-pay | Admitting: Internal Medicine

## 2018-01-10 VITALS — BP 160/98 | HR 72 | Temp 98.2°F | Resp 16 | Ht 61.0 in | Wt 140.1 lb

## 2018-01-10 DIAGNOSIS — J984 Other disorders of lung: Secondary | ICD-10-CM | POA: Diagnosis not present

## 2018-01-10 DIAGNOSIS — I2699 Other pulmonary embolism without acute cor pulmonale: Secondary | ICD-10-CM | POA: Diagnosis not present

## 2018-01-10 DIAGNOSIS — I10 Essential (primary) hypertension: Secondary | ICD-10-CM | POA: Diagnosis not present

## 2018-01-10 MED ORDER — CLINDAMYCIN PHOSPHATE 1 % EX SOLN: 1.0000 "application " | Freq: Every day | CUTANEOUS | 1 refills | Status: DC | PRN

## 2018-01-10 NOTE — Patient Instructions (Addendum)
[facility] -  Address: [address], [place], [place] [address]     Phone: [phone]  Continue your current medications.

## 2018-01-10 NOTE — Progress Notes (Signed)
Subjective:    Patient ID: [name], female    DOB: July 30, 1945, 72 y.o.   MRN: [id number]  HPI The patient is here for an acute visit to review her recent tests.  She saw cardiology in August 2019 for shortness of breath and atypical chest pain.  Dr. [name] ordered a coronary CTA.  She also ordered a bilateral lower extremity arterial duplex to evaluate her lower extremity erythema.  The lower extremity ultrasound did not reveal any peripheral arterial disease.  The CT scan showed minimal coronary artery disease.  It did show a left lower lung mass that was concerning for possible malignancy.  A PET CT was ordered and a referral for oncology was placed.  LLL mass: She has seen oncology.  She has had a CT scan of the chest and a recent PET scan for further evaluation.  The PET scan did not show any hypermetabolic activity of the left lower lobe lesion or other pulmonary nodules in the lungs.  The radiologist felt the findings were more suggestive of a benign etiology likely inflammatory in origin, possible bronchocels.  The oncologist recommended repeat CT scan of the chest in 6 months.  She was referred to pulmonary for further evaluation as well.  Pulmonary embolism: This was an incidental finding on the CT scan of the chest with contrast on 12/22/2017 and she is currently taking Xarelto.  She has had increased bruising since being on the Xarelto, but denies any abnormal bleeding.  She is nervous about being on the Xarelto because she knows the risks.  She wonders if she should switch to warfarin.  Hypertension: She is taking her medication daily. She is compliant with a low sodium diet.    She does monitor her blood pressure at home and and has been well controlled.  At one point after starting the Xarelto it was actually very low.    White pus filled dots on hands - has used clindamycin liquid in the past and it has worked.  She has had this intermittently over the years.  She wanted  to get a refill of the clindamycin.  Medications and allergies reviewed with patient and updated if appropriate.  Patient Active Problem List   Diagnosis Date Noted  . Pulmonary embolism ([place]) 12/24/2017  . Lung mass 12/24/2017  . HLD (hyperlipidemia) 12/24/2017  . Small vessel disease, cerebrovascular 07/31/2017  . Atypical chest pain 06/14/2017  . Dizziness 06/14/2017  . Easy bruising 05/30/2017  . Hyperuricemia 05/03/2017  . Arthralgia 05/01/2017  . Hair loss 05/01/2017  . Vitamin D deficiency 10/31/2016  . Redness of skin, feet 10/31/2016  . Diastolic dysfunction 40/98/1191  . Vertigo 12/24/2015  . Fatigue 12/24/2015  . Nonspecific abnormal electrocardiogram (ECG) (EKG) 12/24/2015  . Bilateral carotid artery disease, Mild 05/31/2015  . Thyroid nodule 05/31/2015  . Psoriasis   . Scoliosis   . Diabetes type 2, controlled ([place]) 06/03/2008  . Dyslipidemia 06/03/2008  . CARPAL TUNNEL SYNDROME 06/03/2008  . HTN (hypertension) 06/03/2008  . ALLERGIC RHINITIS 06/03/2008  . Asthma 06/03/2008  . GERD (gastroesophageal reflux disease) 06/03/2008  . Osteopenia 06/03/2008  . COLONIC POLYPS, HX OF 06/03/2008    Current Outpatient Medications on File Prior to Visit  Medication Sig Dispense Refill  . BENICAR 20 MG tablet TAKE ONE (1) TABLET BY MOUTH TWO (2) TIMES DAILY (Patient taking differently: Take 20 mg by mouth daily. ) 180 tablet 1  . blood glucose meter kit and supplies KIT Dispense based  on patient and insurance preference. ONE TOUCH ULTRA 1 each 0  . BYSTOLIC 2.5 MG tablet TAKE ONE (1) TABLET BY MOUTH EVERY DAY (Patient taking differently: Take 2.5 mg by mouth daily after breakfast. ) 30 tablet 5  . Cholecalciferol (VITAMIN D3) 2000 units TABS Take 2,000 Units by mouth at bedtime.     . clindamycin (CLEOCIN T) 1 % external solution Apply 1 application topically daily as needed (cuts).     . clobetasol (TEMOVATE) 0.05 % external solution Apply 1 application topically daily.      . Coenzyme Q10 (COQ10) 200 MG CAPS Take 1 capsule by mouth at bedtime. Gummies    . doxylamine, Sleep, (UNISOM) 25 MG tablet Take 12.5 mg by mouth at bedtime as needed for sleep.     Scarlette Shorts SURECLICK 50 MG/ML injection Inject into the skin once a week. Mondays. Patient unsure of dose    . glucose blood (ONE TOUCH ULTRA TEST) test strip Use test strip to check blood sugar at least 3 times 300 each 1  . halobetasol (ULTRAVATE) 0.05 % cream Apply 1 application topically daily.    Marland Kitchen ibuprofen (ADVIL,MOTRIN) 200 MG tablet Take 200 mg by mouth every 6 (six) hours as needed for mild pain.    . Lancets (ONETOUCH ULTRASOFT) lancets Use lancet to test blood sugar at least twice a day or as directed by your physician. 200 each 11  . loratadine (CLARITIN REDITABS) 10 MG dissolvable tablet Take 10 mg by mouth every other day as needed for allergies.    Marland Kitchen omeprazole (PRILOSEC) 20 MG capsule Take 20 mg by mouth daily. OTC    . oxymetazoline (AFRIN 12 HOUR) 0.05 % nasal spray Place 1 spray into the nose 2 (two) times daily.     . Pediatric Multiple Vit-C-FA (MULTIVITAMIN ANIMAL SHAPES, WITH CA/FA,) with C & FA chewable tablet Chew 1 tablet by mouth at bedtime.     . Pitavastatin Calcium 1 MG TABS Take 1 tablet (1 mg total) by mouth daily. (Patient taking differently: Take 1 mg by mouth at bedtime. ) 30 tablet 5  . rivaroxaban (XARELTO) 20 MG TABS tablet Take 1 tablet (20 mg total) by mouth daily with supper. 30 tablet 1  . Rivaroxaban 15 & 20 MG TBPK Take as directed on package: Start with one 58m tablet by mouth twice a day with food. On Day 22, switch to one 223mtablet once a day with food. 51 each 0   No current facility-administered medications on file prior to visit.     Past Medical History:  Diagnosis Date  . ALLERGIC RHINITIS   . ANEMIA-NOS   . ASTHMA   . Carpal tunnel syndrome   . COLONIC POLYPS, HX OF   . DIABETES MELLITUS, TYPE II    diet controlled  . GERD   . HYPERLIPIDEMIA   .  HYPERTENSION   . OSTEOPENIA   . Psoriasis    severe, began soriatane 01/2012  . Rectal fissure   . Scoliosis     Past Surgical History:  Procedure Laterality Date  . benign rectal growth  2004   removed by Dr. RoZella Richer. BREAST BIOPSY Right   . BREAST EXCISIONAL BIOPSY Left   . BREAST SURGERY  1988   biopsy  . CESAREAN SECTION      Social History   Socioeconomic History  . Marital status: Single    Spouse name: Not on file  . Number of children: Not on file  .  Years of education: Not on file  . Highest education level: Not on file  Occupational History  . Not on file  Social Needs  . Financial resource strain: Not on file  . Food insecurity:    Worry: Not on file    Inability: Not on file  . Transportation needs:    Medical: Not on file    Non-medical: Not on file  Tobacco Use  . Smoking status: Former Smoker    Last attempt to quit: 02/14/1974    Years since quitting: 43.9  . Smokeless tobacco: Never Used  Substance and Sexual Activity  . Alcohol use: No  . Drug use: No  . Sexual activity: Not on file  Lifestyle  . Physical activity:    Days per week: Not on file    Minutes per session: Not on file  . Stress: Not on file  Relationships  . Social connections:    Talks on phone: Not on file    Gets together: Not on file    Attends religious service: Not on file    Active member of club or organization: Not on file    Attends meetings of clubs or organizations: Not on file    Relationship status: Not on file  Other Topics Concern  . Not on file  Social History Narrative  . Not on file    Family History  Problem Relation Age of Onset  . Lung cancer Father   . Arthritis Other        Parents  . Asthma Other        parent, other relative  . Breast cancer Other        other relative  . Hypertension Other        parent, other relative  . Heart disease Other        parent, other relative  . Breast cancer Maternal Aunt 68    Review of  Systems     Objective:   Vitals:   01/10/18 1315  BP: (!) 160/98  Pulse: 72  Resp: 16  Temp: 98.2 F (36.8 C)  SpO2: 99%   BP Readings from Last 3 Encounters:  01/10/18 (!) 160/98  01/05/18 (!) 179/93  12/24/17 (!) 162/77   Wt Readings from Last 3 Encounters:  01/10/18 140 lb 1.9 oz (63.6 kg)  01/05/18 140 lb 1.6 oz (63.5 kg)  12/24/17 140 lb 4.8 oz (63.6 kg)   Body mass index is 26.48 kg/m.   Physical Exam  Constitutional: She appears well-developed and well-nourished. No distress.  Pulmonary/Chest: Breath sounds normal.  Skin: Skin is warm and dry. She is not diaphoretic.  Few small white papules on right hand-areas where the papules had popped and there is mild skin erosion.  No surrounding erythema or swelling           Assessment & Plan:   30 minutes were spent face-to-face with the patient, over 50% of which was spent reviewing her recent test results in detail and counseling her regarding the follow-up and possible treatment.  We specifically discussed her mild coronary artery disease, lung mass, findings and lungs suggestive of possible bronchocele's, pulmonary embolism and other findings on the imaging such as hepatic steatosis and pancreatic atrophy.  We also discussed warfarin versus Xarelto.    See Problem List for Assessment and Plan of chronic medical problems.

## 2018-01-11 DIAGNOSIS — J984 Other disorders of lung: Secondary | ICD-10-CM | POA: Insufficient documentation

## 2018-01-11 NOTE — Assessment & Plan Note (Signed)
Incidental finding on CT of chest Currently on Xarelto-she may want to switch to warfarin, but will think about it Has been referred to pulmonary

## 2018-01-11 NOTE — Assessment & Plan Note (Signed)
Abnormal CT/PET scan of lungs No hypermetabolic activity-unlikely cancer Possible bronchocele's or other lung disease Will be seeing pulmonary for further evaluation Oncology right recommended possible CT in 6 months-we will let pulmonary follow-up as they see fit

## 2018-01-11 NOTE — Assessment & Plan Note (Signed)
Continue current medications She does monitor her blood pressure at home and it is much better controlled-she will continue to monitor her blood pressure on a regular basis Regular exercise stressed Work on weight loss Low-sodium diet

## 2018-01-15 ENCOUNTER — Encounter: Payer: Self-pay | Admitting: Neurology

## 2018-01-15 ENCOUNTER — Ambulatory Visit (INDEPENDENT_AMBULATORY_CARE_PROVIDER_SITE_OTHER): Payer: Medicare Other | Admitting: Neurology

## 2018-01-15 ENCOUNTER — Encounter

## 2018-01-15 VITALS — BP 150/100 | HR 74 | Ht 61.0 in | Wt 141.5 lb

## 2018-01-15 DIAGNOSIS — I679 Cerebrovascular disease, unspecified: Secondary | ICD-10-CM | POA: Diagnosis not present

## 2018-01-15 NOTE — Patient Instructions (Addendum)
General guidelines for stroke risk factor management, if present  Hypertension  Target blood pressure <140/90, <130/80 for high risk; normal is 120/80   Hyperlipidemia  Target total cholesterol < 200  Target LDL <100, < 70 for high risk  Target HDL >45 for men, >55 for women  Target triglycerides <150   Diabetes  Target HgbA1c <7%   Physical inactivity  Target is exercise at least 3 times per week  Target waist circumference, in inches is <35 for women and <40 for men  I recommend that you start daily aspirin after you have completed xeralto Please follow-up with your primary care doctor for cholesterol management Start low salt, low fat diet Stay active  Return to clinic as needed

## 2018-01-15 NOTE — Progress Notes (Signed)
[facility] Clinic Note - Initial Visit   Date: 01/15/18  [name] MRN: [id number] DOB: [birth year]   Dear Dr. Quay Burow:  Thank you for your kind referral of [name] for consultation of cerebrovascular disease. Although her history is well known to you, please allow [place] to reiterate it for the purpose of our medical record. The patient was accompanied to the clinic by self.    History of Present Illness: [name] is a 72 y.o. right-handed Caucasian female with hypertension, diabetes mellitus, GERD, asthma, hyperlipidemia, history of left breast cancer, remote history of tobacco use, PE, psoriasis, presenting for evaluation of cerebrovascular disease.   She is a retired Clinical biochemist.  She first developed vertigo in October 2017 and had MRI brain which showed moderate microvascular changes.  Symptoms improved with vestibular therapy.  She was doing well until April 2019 and had another bout of severe vertigo.  She was referred to vestibular therapy again and had MRI brain which showed progressive microvascular ischemic changes.  She did a lot of online research about this and is concerned about potential risk of developing stroke and dementia and is here for my opinion.  She continues to have spells of unsteadiness, such as swaying.  These spells last about 5-10 seconds and occur every 3-4 weeks. Symptoms tends to occur with turning in bed or sitting still and are alleviated with walking. She does not have numbness/tingling, weakness, or headache.  No personal history of migraine.  Diabetes is well-controlled on diet.  Her cholesterol has been elevated and is taking pitavastatin 84m daily.  She is unable to tolerate statin due to myalgia. She has severe white coat syndrome and her blood pressure is always elevated, but much better controlled at home.   She had no personal history of stroke or TIA.  She had not been on aspirin due to  easy bruising and concerns of bleeding.    Recently, she was being evaluated for shortness of breath and had imaging concerning for lung mass.  PET scan did not show concerning lesion and was more suggestive of benign inflammatory changes and referred to pulmonology for their opinion.   Out-side paper records, electronic medical record, and images have been reviewed where available and summarized as:  Lab Results  Component Value Date   HGBA1C 6.3 10/30/2017   Lab Results  Component Value Date   TSH 3.01 05/01/2017    MRI brain wo contrast 12/29/2010:  Mild atrophy with mild to moderate chronic microvascular ischemic change.  No acute intracranial findings.  MRI brain wo constat 12/19/2015: 1. Acute intracranial abnormality. 2. Chronic microvascular ischemia.  MRI/A brain 07/16/2017:  No acute or reversible finding. Advanced chronic small-vessel ischemic changes throughout the brain as outlined above. No specific cause of dizziness identified.  Normal intracranial MR angiography of the large and medium size vessels.  UKoreacarotids 05/27/2015:  1-39% bilateral ICA stenosis  Lab Results  Component Value Date   CHOL 210 (H) 10/30/2017   HDL 48.30 10/30/2017   LDLCALC 104 (H) 10/31/2016   LDLDIRECT 131.0 10/30/2017   TRIG 213.0 (H) 10/30/2017   CHOLHDL 4 10/30/2017     Past Medical History:  Diagnosis Date  . ALLERGIC RHINITIS   . ANEMIA-NOS   . ASTHMA   . Carpal tunnel syndrome   . COLONIC POLYPS, HX OF   . DIABETES MELLITUS, TYPE II    diet controlled  . GERD   . HYPERLIPIDEMIA   .  HYPERTENSION   . OSTEOPENIA   . Psoriasis    severe, began soriatane 01/2012  . Rectal fissure   . Scoliosis     Past Surgical History:  Procedure Laterality Date  . benign rectal growth  2004   removed by Dr. Zella Richer  . BREAST BIOPSY Right   . BREAST EXCISIONAL BIOPSY Left   . BREAST SURGERY  1988   biopsy  . CESAREAN SECTION       Medications:  Outpatient Encounter  Medications as of 01/15/2018  Medication Sig  . BENICAR 20 MG tablet TAKE ONE (1) TABLET BY MOUTH TWO (2) TIMES DAILY (Patient taking differently: Take 20 mg by mouth daily. )  . blood glucose meter kit and supplies KIT Dispense based on patient and insurance preference. ONE TOUCH ULTRA  . BYSTOLIC 2.5 MG tablet TAKE ONE (1) TABLET BY MOUTH EVERY DAY (Patient taking differently: Take 2.5 mg by mouth daily after breakfast. )  . Cholecalciferol (VITAMIN D3) 2000 units TABS Take 2,000 Units by mouth at bedtime.   . clindamycin (CLEOCIN T) 1 % external solution Apply 1 application topically daily as needed (cuts).  . clobetasol (TEMOVATE) 0.05 % external solution Apply 1 application topically daily.   . Coenzyme Q10 (COQ10) 200 MG CAPS Take 1 capsule by mouth at bedtime. Gummies  . doxylamine, Sleep, (UNISOM) 25 MG tablet Take 12.5 mg by mouth at bedtime as needed for sleep.   Scarlette Shorts SURECLICK 50 MG/ML injection Inject into the skin once a week. Mondays. Patient unsure of dose  . glucose blood (ONE TOUCH ULTRA TEST) test strip Use test strip to check blood sugar at least 3 times  . halobetasol (ULTRAVATE) 0.05 % cream Apply 1 application topically daily.  Marland Kitchen ibuprofen (ADVIL,MOTRIN) 200 MG tablet Take 200 mg by mouth every 6 (six) hours as needed for mild pain.  . Lancets (ONETOUCH ULTRASOFT) lancets Use lancet to test blood sugar at least twice a day or as directed by your physician.  . loratadine (CLARITIN REDITABS) 10 MG dissolvable tablet Take 10 mg by mouth every other day as needed for allergies.  Marland Kitchen omeprazole (PRILOSEC) 20 MG capsule Take 20 mg by mouth daily. OTC  . oxymetazoline (AFRIN 12 HOUR) 0.05 % nasal spray Place 1 spray into the nose 2 (two) times daily.   . Pediatric Multiple Vit-C-FA (MULTIVITAMIN ANIMAL SHAPES, WITH CA/FA,) with C & FA chewable tablet Chew 1 tablet by mouth at bedtime.   . Pitavastatin Calcium 1 MG TABS Take 1 tablet (1 mg total) by mouth daily. (Patient taking  differently: Take 1 mg by mouth at bedtime. )  . rivaroxaban (XARELTO) 20 MG TABS tablet Take 1 tablet (20 mg total) by mouth daily with supper.  . Rivaroxaban 15 & 20 MG TBPK Take as directed on package: Start with one 54m tablet by mouth twice a day with food. On Day 22, switch to one 261mtablet once a day with food.   No facility-administered encounter medications on file as of 01/15/2018.      Allergies:  Allergies  Allergen Reactions  . Amlodipine     swelling  . Clarithromycin     Nausea, I can pass out   . Codeine     Nausea, I can pass out   . Erythromycin     Other reaction(s): Vomiting (intolerance)  . Statins     Muscle pain   . Tetracycline     Family History: Family History  Problem Relation Age of  Onset  . Lung cancer Father   . Arthritis Other        Parents  . Asthma Other        parent, other relative  . Breast cancer Other        other relative  . Hypertension Other        parent, other relative  . Heart disease Other        parent, other relative  . Heart disease Mother   . Asthma Mother   . Breast cancer Maternal Aunt 68    Social History: Social History   Tobacco Use  . Smoking status: Former Smoker    Last attempt to quit: 02/14/1974    Years since quitting: 43.9  . Smokeless tobacco: Never Used  Substance Use Topics  . Alcohol use: No  . Drug use: No   Social History   Social History Narrative   Lives alone in a one story home.  Has one child and one grandchild.  Retired Careers adviser.  Education: college.     Review of Systems:  CONSTITUTIONAL: No fevers, chills, night sweats, or weight loss.   EYES: No visual changes or eye pain ENT: No hearing changes.  No history of nose bleeds.   RESPIRATORY: No cough, wheezing and shortness of breath.   CARDIOVASCULAR: Negative for chest pain, and palpitations.   GI: Negative for abdominal discomfort, blood in stools or black stools.  No recent change in  bowel habits.   GU:  No history of incontinence.   MUSCLOSKELETAL: +history of joint pain or swelling.  No myalgias.   SKIN: Negative for lesions, rash, and itching.   HEMATOLOGY/ONCOLOGY: Negative for prolonged bleeding, bruising easily, and swollen nodes.  +history of cancer.   ENDOCRINE: Negative for cold or heat intolerance, polydipsia or goiter.   PSYCH:  No depression +anxiety symptoms.   NEURO: As Above.   Vital Signs:  BP (!) 150/100   Pulse 74   Ht '5\' 1"'  (1.549 m)   Wt 141 lb 8 oz (64.2 kg)   SpO2 96%   BMI 26.74 kg/m    General Medical Exam:   General:  Well appearing, comfortable.   Eyes/ENT: see cranial nerve examination.   Neck: No masses appreciated.  Full range of motion without tenderness.  No carotid bruits. Respiratory:  Clear to auscultation, good air entry bilaterally.   Cardiac:  Regular rate and rhythm, no murmur.   Extremities:  No deformities, edema, or skin discoloration.  Skin:  No rashes or lesions.  Neurological Exam: MENTAL STATUS including orientation to time, place, person, recent and remote memory, attention span and concentration, language, and fund of knowledge is normal.  Speech is not dysarthric.  CRANIAL NERVES: II:  No visual field defects.  Unremarkable fundi.   III-IV-VI: Pupils equal round and reactive to light.  Normal conjugate, extra-ocular eye movements in all directions of gaze.  No nystagmus.  No ptosis.   V:  Normal facial sensation.     VII:  Normal facial symmetry and movements.   VIII:  Normal hearing and vestibular function.   IX-X:  Normal palatal movement.   XI:  Normal shoulder shrug and head rotation.   XII:  Normal tongue strength and range of motion, no deviation or fasciculation.  MOTOR:  No atrophy, fasciculations or abnormal movements.  No pronator drift.  Tone is normal.    Right Upper Extremity:    Left Upper Extremity:    Deltoid  5/5   Deltoid  5/5   Biceps  5/5   Biceps  5/5   Triceps  5/5   Triceps  5/5     Wrist extensors  5/5   Wrist extensors  5/5   Wrist flexors  5/5   Wrist flexors  5/5   Finger extensors  5/5   Finger extensors  5/5   Finger flexors  5/5   Finger flexors  5/5   Dorsal interossei  5/5   Dorsal interossei  5/5   Abductor pollicis  5/5   Abductor pollicis  5/5   Tone (Ashworth scale)  0  Tone (Ashworth scale)  0   Right Lower Extremity:    Left Lower Extremity:    Hip flexors  5/5   Hip flexors  5/5   Hip extensors  5/5   Hip extensors  5/5   Knee flexors  5/5   Knee flexors  5/5   Knee extensors  5/5   Knee extensors  5/5   Dorsiflexors  5/5   Dorsiflexors  5/5   Plantarflexors  5/5   Plantarflexors  5/5   Toe extensors  5/5   Toe extensors  5/5   Toe flexors  5/5   Toe flexors  5/5   Tone (Ashworth scale)  0  Tone (Ashworth scale)  0   MSRs:  Right                                                                 Left brachioradialis 2+  brachioradialis 2+  biceps 2+  biceps 2+  triceps 2+  triceps 2+  patellar 2+  patellar 2+  ankle jerk 2+  ankle jerk 2+  Hoffman no  Hoffman no  plantar response down  plantar response down   SENSORY:  Normal and symmetric perception of light touch, pinprick, vibration, and proprioception.  Romberg's sign absent.   COORDINATION/GAIT: Normal finger-to- nose-finger.  Intact rapid alternating movements bilaterally.  Able to rise from a chair without using arms.  Gait narrow based and stable. Tandem and stressed gait intact.    IMPRESSION: Cerebrovascular small vessel disease.  MRI brain from 2012, 2017, and 2019 was personally viewed with patient.  There is evidence of progressive small vessel changes on her imaging, which can be seen in individuals as they age and especially with vascular risk factors.  Fortunately, she has not had stroke or TIA and her neurological exam is non-focal.  She was informed that both uncontrolled hypertension and hyperlipidemia are her greatest risk factors contributing to these findings.  Her diabetes  is well-controlled on diet and she has remote history of tobacco use.  No personal history of migraines.  Patient was reassured that white matter changes are not suggestive of demyelinating disease or leukodystrophy.    She is taking pitavastatin 2m daily for cholesterol and does not tolerate other statins or zetia due to myalgias.  LDL is 131, goal is <100.  Will need to discuss with her PCP for alternative options, such as PCSK9 inhibitor.   Her blood pressure is elevated in the office, which she attributes to white coat syndrome, and states that her BP is better on her home checks. I stressed the importance of life style modification including low fat,  low salt diet and exercise.  She is currently on Xeralto for PE and once she has completed therapy, I recommend starting daily aspirin 87m daily.  She had many questions, which were addressed to the best of my ability.   Return to clinic as needed  The duration of this appointment visit was 50 minutes of face-to-face time with the patient.  Greater than 50% of this time was spent in counseling, explanation of diagnosis, planning of further management, and coordination of care.   Thank you for allowing me to participate in patient's care.  If I can answer any additional questions, I would be pleased to do so.    Sincerely,    Lakysha Kossman K. PPosey Pronto DO

## 2018-02-01 ENCOUNTER — Ambulatory Visit (INDEPENDENT_AMBULATORY_CARE_PROVIDER_SITE_OTHER): Payer: Medicare Other | Admitting: Pulmonary Disease

## 2018-02-01 ENCOUNTER — Encounter: Payer: Self-pay | Admitting: Pulmonary Disease

## 2018-02-01 ENCOUNTER — Ambulatory Visit (INDEPENDENT_AMBULATORY_CARE_PROVIDER_SITE_OTHER)
Admission: RE | Admit: 2018-02-01 | Discharge: 2018-02-01 | Disposition: A | Discharge status: Home / Self Care | Payer: Medicare Other | Source: Ambulatory Visit | Attending: Pulmonary Disease | Admitting: Pulmonary Disease

## 2018-02-01 VITALS — BP 158/88 | HR 77 | Ht 61.0 in | Wt 140.0 lb

## 2018-02-01 DIAGNOSIS — R9389 Abnormal findings on diagnostic imaging of other specified body structures: Secondary | ICD-10-CM | POA: Diagnosis not present

## 2018-02-01 DIAGNOSIS — R918 Other nonspecific abnormal finding of lung field: Secondary | ICD-10-CM

## 2018-02-01 DIAGNOSIS — J984 Other disorders of lung: Secondary | ICD-10-CM

## 2018-02-01 DIAGNOSIS — R0600 Dyspnea, unspecified: Secondary | ICD-10-CM | POA: Diagnosis not present

## 2018-02-01 DIAGNOSIS — J410 Simple chronic bronchitis: Secondary | ICD-10-CM

## 2018-02-01 MED ORDER — LEVOFLOXACIN 750 MG PO TABS: 750.0000 mg | ORAL_TABLET | Freq: Every day | ORAL | 0 refills | Status: DC

## 2018-02-01 NOTE — Patient Instructions (Signed)
Pulmonary embolism: Continue taking Xarelto as you are doing  Abnormal pulmonary nodules: As discussed today is not clear to me what is causing this.  There are multiple possible infectious or inflammatory causes.  I would like to check your blood for something called amyloidosis so we will send a blood test as well as a urine test.  We will also sample your mucus and send it for atypical cultures as you take Enbrel which increases the possibility of you having an atypical chest infection. Please provide [place] with a sample of your mucus. You may ultimately need a bronchoscopy to assess further  Acute bronchitis: I am hopeful that this is just bronchitis and not pneumonia.  We will check a chest x-ray. We will treat you with Levaquin 750 mg daily x5 days Call me if no improvement  Shortness of breath this year: I am glad this is improved I would like to get a pulmonary function test when you return to clinic.  We will see you back in 3 to 4 weeks or sooner if needed
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
## 2018-02-05 ENCOUNTER — Other Ambulatory Visit: Payer: Medicare Other

## 2018-02-05 LAB — PROTEIN ELECTROPHORESIS, SERUM
Albumin ELP: 3.7 g/dL — ABNORMAL LOW (ref 3.8–4.8)
Alpha 1: 0.3 g/dL (ref 0.2–0.3)
Alpha 2: 0.8 g/dL (ref 0.5–0.9)
Beta 2: 0.4 g/dL (ref 0.2–0.5)
Beta Globulin: 0.6 g/dL (ref 0.4–0.6)
Gamma Globulin: 1.1 g/dL (ref 0.8–1.7)
Total Protein: 6.9 g/dL (ref 6.1–8.1)

## 2018-02-05 NOTE — Addendum Note (Signed)
Addended by: Parke Poisson E on: 02/05/2018 11:06 AM   Modules accepted: Orders

## 2018-02-14 LAB — UPEP/UIFE/LIGHT CHAINS/TP, 24-HR UR
% BETA, Urine: 0 %
ALBUMIN, U: 0 %
ALPHA 1 URINE: 0 %
ALPHA-2-GLOBULIN, U: 0 %
Free Kappa Lt Chains,Ur: 18.8 mg/L (ref 1.35–24.19)
Free Lambda Lt Chains,Ur: 0.64 mg/L (ref 0.24–6.66)
GAMMA GLOBULIN URINE: 0 %
Kappa/Lambda Ratio,U: 29.38 — ABNORMAL HIGH (ref 2.04–10.37)
Protein, 24H Urine: 75 mg/24 hr (ref 30–150)
Protein, Ur: 4.4 mg/dL

## 2018-02-20 ENCOUNTER — Telehealth: Payer: Self-pay | Admitting: Pulmonary Disease

## 2018-02-20 NOTE — Telephone Encounter (Signed)
Called and spoke with patient, BQ had an opening for 02/28/18 here in [place] office. Patient has been scheduled and confirmed ok with [facility]. Nothing further needed.

## 2018-02-21 ENCOUNTER — Ambulatory Visit: Payer: Medicare Other | Admitting: Pulmonary Disease

## 2018-02-22 ENCOUNTER — Encounter: Payer: Self-pay | Admitting: Cardiology

## 2018-02-22 ENCOUNTER — Ambulatory Visit (INDEPENDENT_AMBULATORY_CARE_PROVIDER_SITE_OTHER): Payer: Medicare Other | Admitting: Cardiology

## 2018-02-22 VITALS — BP 144/90 | HR 72 | Ht 61.0 in | Wt 140.6 lb

## 2018-02-22 DIAGNOSIS — E785 Hyperlipidemia, unspecified: Secondary | ICD-10-CM

## 2018-02-22 DIAGNOSIS — R0609 Other forms of dyspnea: Secondary | ICD-10-CM

## 2018-02-22 DIAGNOSIS — I10 Essential (primary) hypertension: Secondary | ICD-10-CM | POA: Diagnosis not present

## 2018-02-22 DIAGNOSIS — R918 Other nonspecific abnormal finding of lung field: Secondary | ICD-10-CM | POA: Diagnosis not present

## 2018-02-22 DIAGNOSIS — I2699 Other pulmonary embolism without acute cor pulmonale: Secondary | ICD-10-CM | POA: Diagnosis not present

## 2018-02-22 DIAGNOSIS — I2510 Atherosclerotic heart disease of native coronary artery without angina pectoris: Secondary | ICD-10-CM

## 2018-02-22 DIAGNOSIS — R0600 Dyspnea, unspecified: Secondary | ICD-10-CM

## 2018-02-22 MED ORDER — RIVAROXABAN 20 MG PO TABS: 20.0000 mg | ORAL_TABLET | Freq: Every day | ORAL | 11 refills | Status: DC

## 2018-02-22 NOTE — Patient Instructions (Signed)
Medication Instructions:   Your physician recommends that you continue on your current medications as directed. Please refer to the Current Medication list given to you today.  If you need a refill on your cardiac medications before your next appointment, please call your pharmacy.       Lab work:  TODAY--CMET AND CBC W DIFF  If you have labs (blood work) drawn today and your tests are completely normal, you will receive your results only by: Marland Kitchen MyChart Message (if you have MyChart) OR . A paper copy in the mail If you have any lab test that is abnormal or we need to change your treatment, we will call you to review the results.     Follow-Up:  3 MONTHS WITH DR [name]

## 2018-02-22 NOTE — Progress Notes (Signed)
Cardiology Office Note:    Date:  02/22/2018   ID:  [name], DOB [birth year], MRN [id number]  PCP:  Caitlin Rail, MD  Cardiologist:  Caitlin Dawley, MD   Referring MD: Caitlin Rail, MD   Chief complain: Shortness of breath, uncontrolled hypertension, hyperlipidemia  History of Present Illness:    [name] is a 73 y.o. female with a hx of difficult to control hypertension, hyperlipidemia, who is coming for concern of worsening shortness of breath on exertion, she has been having difficulties controlling her blood pressure, now managed well by Dr. Quay Burow, she was previously on amlodipine that caused lower extremity edema, her blood pressure could be as high as 200, also any stool low she gets dizzy and she was experiencing falls. She states that since his blood pressure has been better controlled her shortness of breath has improved but is still persistent.  She can only walk about quarter mile before she has to stop.  She has also developed unusual redness in her toes, denies any claudications. She has recently underwent brain MRI that showed progressive microvascular disease.  02/22/2018 -this is a follow-up, the patient underwent coronary CTA that showed mild nonobstructive CAD.  She was started on pitavastatin 1 mg, she previously did not tolerate pravastatin and atorvastatin.  She developed hip pain and back pain and discontinued pitavastatin with some improvement of symptoms, she is willing to retry again.  She was also found to have a lung mass in her left lower lobe, full chest CT showed multiple other masses however PET CT was negative for malignancy.  She is now following with pulmonary Dr. Lake Bells who wants to perform bronchoscopy in the near future.  She denies any chest pain.  She recently had bronchitis and still is recovering from that continues to have productive cough.  She was given Levaquin by Dr. Lake Bells.  Past Medical History:  Diagnosis Date  .  ALLERGIC RHINITIS   . ANEMIA-NOS   . ASTHMA   . Carpal tunnel syndrome   . COLONIC POLYPS, HX OF   . DIABETES MELLITUS, TYPE II    diet controlled  . GERD   . HYPERLIPIDEMIA   . HYPERTENSION   . OSTEOPENIA   . Psoriasis    severe, began soriatane 01/2012  . Rectal fissure   . Scoliosis     Past Surgical History:  Procedure Laterality Date  . benign rectal growth  2004   removed by Dr. Zella Richer  . BREAST BIOPSY Right   . BREAST EXCISIONAL BIOPSY Left   . BREAST SURGERY  1988   biopsy  . CESAREAN SECTION     Current Medications: Current Meds  Medication Sig  . blood glucose meter kit and supplies KIT Dispense based on patient and insurance preference. ONE TOUCH ULTRA  . Cholecalciferol (VITAMIN D3) 2000 units TABS Take 2,000 Units by mouth at bedtime.   . clindamycin (CLEOCIN T) 1 % external solution Apply 1 application topically daily as needed (cuts).  . clobetasol (TEMOVATE) 0.05 % external solution Apply 1 application topically daily.   . Coenzyme Q10 (COQ10) 200 MG CAPS Take 1 capsule by mouth at bedtime. Gummies  . doxylamine, Sleep, (UNISOM) 25 MG tablet Take 12.5 mg by mouth at bedtime as needed for sleep.   Scarlette Shorts SURECLICK 50 MG/ML injection Inject into the skin once a week. Mondays. Patient unsure of dose  . glucose blood (ONE TOUCH ULTRA TEST) test strip Use test strip to check  blood sugar at least 3 times  . halobetasol (ULTRAVATE) 0.05 % cream Apply 1 application topically daily.  . Lancets (ONETOUCH ULTRASOFT) lancets Use lancet to test blood sugar at least twice a day or as directed by your physician.  . levofloxacin (LEVAQUIN) 750 MG tablet Take 1 tablet (750 mg total) by mouth daily.  Marland Kitchen loratadine (CLARITIN REDITABS) 10 MG dissolvable tablet Take 10 mg by mouth every other day as needed for allergies.  Marland Kitchen nebivolol (BYSTOLIC) 2.5 MG tablet Take 2.5 mg by mouth daily.  Marland Kitchen olmesartan (BENICAR) 20 MG tablet Take 20 mg by mouth 2 (two) times daily.  Marland Kitchen  omeprazole (PRILOSEC) 20 MG capsule Take 20 mg by mouth daily. OTC  . oxymetazoline (AFRIN 12 HOUR) 0.05 % nasal spray Place 1 spray into the nose 2 (two) times daily.   . Pediatric Multiple Vit-C-FA (MULTIVITAMIN ANIMAL SHAPES, WITH CA/FA,) with C & FA chewable tablet Chew 1 tablet by mouth at bedtime.   . Pitavastatin Calcium 1 MG TABS Take 1 mg by mouth every other day.  . rivaroxaban (XARELTO) 20 MG TABS tablet Take 1 tablet (20 mg total) by mouth daily with supper.  . [DISCONTINUED] rivaroxaban (XARELTO) 20 MG TABS tablet Take 1 tablet (20 mg total) by mouth daily with supper.    Allergies:   Amlodipine; Clarithromycin; Codeine; Erythromycin; Statins; and Tetracycline   Social History   Socioeconomic History  . Marital status: Single    Spouse name: Not on file  . Number of children: 1  . Years of education: 7  . Highest education level: Bachelor's degree (e.g., BA, AB, BS)  Occupational History  . Occupation: retired Careers adviser  Social Needs  . Financial resource strain: Not on file  . Food insecurity:    Worry: Not on file    Inability: Not on file  . Transportation needs:    Medical: Not on file    Non-medical: Not on file  Tobacco Use  . Smoking status: Former Smoker    Packs/day: 1.00    Years: 10.00    Pack years: 10.00    Last attempt to quit: 02/14/1974    Years since quitting: 44.0  . Smokeless tobacco: Never Used  Substance and Sexual Activity  . Alcohol use: No  . Drug use: No  . Sexual activity: Not on file  Lifestyle  . Physical activity:    Days per week: Not on file    Minutes per session: Not on file  . Stress: Not on file  Relationships  . Social connections:    Talks on phone: Not on file    Gets together: Not on file    Attends religious service: Not on file    Active member of club or organization: Not on file    Attends meetings of clubs or organizations: Not on file    Relationship status: Not on file    Other Topics Concern  . Not on file  Social History Narrative   Lives alone in a one story home.  Has one child and one grandchild.  Retired Careers adviser.  Education: college.      Family History: The patient's family history includes Arthritis in an other family member; Asthma in her mother and another family member; Breast cancer in an other family member; Breast cancer (age of onset: 28) in her maternal aunt; Heart disease in her mother and another family member; Hypertension in an other family member;  Lung cancer in her father; Parkinson's disease in her maternal grandmother; Rheumatic fever in her maternal grandfather.  ROS:   Please see the history of present illness.    All other systems reviewed and are negative.  EKGs/Labs/Other Studies Reviewed:    The following studies were reviewed today:  EKG:  EKG is not ordered today.   Recent Labs: 05/01/2017: TSH 3.01 12/19/2017: ALT 23 12/23/2017: B Natriuretic Peptide 40.2; BUN 25; Creatinine, Ser 0.97; Hemoglobin 13.7; Platelets 277; Potassium 3.8; Sodium 141  Recent Lipid Panel    Component Value Date/Time   CHOL 210 (H) 10/30/2017 0922   TRIG 213.0 (H) 10/30/2017 0922   HDL 48.30 10/30/2017 0922   CHOLHDL 4 10/30/2017 0922   VLDL 42.6 (H) 10/30/2017 0922   LDLCALC 104 (H) 10/31/2016 0933   LDLDIRECT 131.0 10/30/2017 0922    Physical Exam:    VS:  BP (!) 144/90   Pulse 72   Ht '5\' 1"'  (1.549 m)   Wt 140 lb 9.6 oz (63.8 kg)   SpO2 99%   BMI 26.57 kg/m     Wt Readings from Last 3 Encounters:  02/22/18 140 lb 9.6 oz (63.8 kg)  02/01/18 140 lb (63.5 kg)  01/15/18 141 lb 8 oz (64.2 kg)    GEN: Well nourished, well developed in no acute distress HEENT: Normal NECK: No JVD; No carotid bruits LYMPHATICS: No lymphadenopathy CARDIAC: RRR, no murmurs, rubs, gallops RESPIRATORY:  Clear to auscultation without rales, wheezing or rhonchi  ABDOMEN: Soft, non-tender,  non-distended MUSCULOSKELETAL: Minimal bilateral edema around her ankles, significant erythema in bilateral toes but good pulses.   SKIN: Warm and dry NEUROLOGIC:  Alert and oriented x 3 PSYCHIATRIC:  Normal affect   CT chest: 12/22/2017  1. In addition to the previously noted mass in the left lower lobe there several other smaller irregular-shaped nodules scattered throughout the lungs bilaterally. The irregular shape of the nodules would be unusual for metastatic disease. The possibility of a benign etiology such as multifocal bronchoceles should be considered, but is a diagnosis of exclusion. Malignancy should be excluded, and further evaluation with PET-CT is again suggested. 2. Nonocclusive filling defect in the distal left main pulmonary artery compatible with pulmonary embolism. 3. Hepatic steatosis. 4. Aortic atherosclerosis, in addition to left main and left anterior descending coronary artery disease. Assessment for potential risk factor modification, dietary therapy or pharmacologic therapy may be warranted, if clinically indicated. 5. There are calcifications of the aortic valve. Echocardiographic correlation for evaluation of potential valvular dysfunction may be warranted if clinically indicated.  Coronary CTA: 12/05/2017  1. Coronary calcium score of 6. This was 42 percentile for age and sex matched control. 2. Normal coronary origin with right dominance. 3. There is mild CAD in the proximal LAD, aggressive risk factor modification is recommended SOM most probably secondary to obesity and deconditioning.  PET CT: 01/01/2018  1. For the most part the branching nodularity in the lungs is of low activity and likely benign. Given the somewhat branching tubular appearance, the possibility of multifocal bronchocele is certainly raised. The lesion along the left upper lobe paramediastinal area, and in the right upper lobe just adjacent to the fissure do have some low-grade  activity (1.6 and 1.7, respectively) which may well be inflammatory but for which surveillance imaging in 6 months time would be suggested. 2. The larger lesion in the left lower lobe is not hypermetabolic and is likely benign. Bilateral tonsillar activity in the neck is likely benign. 3.  No worrisome findings in the neck, abdomen/pelvis, or skeleton.   ASSESSMENT:    1. Pulmonary embolism without acute cor pulmonale, unspecified chronicity, unspecified pulmonary embolism type ([place])   2. Hyperlipidemia, unspecified hyperlipidemia type   3. Essential hypertension   4. Lung mass    PLAN:    In order of problems listed above:  1.  Pulmonary embolism -on Xarelto, will refill, will repeat chest CTA in the next follow-up unless she has CT scheduled for follow-up for her pulmonary masses.  2.  Left lower lobe lung mass and other smaller masses, so far considered to be benign however Dr. Lake Bells has suspicion for malignancy and is planning to perform bronchoscopy.  3.  Mild nonobstructive CAD -and aortic atherosclerosis as well as microvascular disease on her brain imaging, I would support continuation of statin she is going to retry and if this fails we will refer to our pharmacy for PCSK9 inhibitors.  4.  Hypertension -controlled  5. Hyperlipidemia - retry pivastatin  4.  Lower extremity erythema, bilateral lower extremity arterial duplex did not show any peripheral arterial disease.  Medication Adjustments/Labs and Tests Ordered: Current medicines are reviewed at length with the patient today.  Concerns regarding medicines are outlined above.  Orders Placed This Encounter  Procedures  . Comp Met (CMET)  . CBC w/Diff   Meds ordered this encounter  Medications  . rivaroxaban (XARELTO) 20 MG TABS tablet    Sig: Take 1 tablet (20 mg total) by mouth daily with supper.    Dispense:  30 tablet    Refill:  11    Patient Instructions  Medication Instructions:   Your physician  recommends that you continue on your current medications as directed. Please refer to the Current Medication list given to you today.  If you need a refill on your cardiac medications before your next appointment, please call your pharmacy.       Lab work:  TODAY--CMET AND CBC W DIFF  If you have labs (blood work) drawn today and your tests are completely normal, you will receive your results only by: Marland Kitchen MyChart Message (if you have MyChart) OR . A paper copy in the mail If you have any lab test that is abnormal or we need to change your treatment, we will call you to review the results.     Follow-Up:  3 MONTHS WITH DR Kate Sable, Caitlin Dawley, MD  02/22/2018 11:55 AM    [place]

## 2018-02-23 LAB — CBC WITH DIFFERENTIAL/PLATELET
Basophils Absolute: 0.1 10*3/uL (ref 0.0–0.2)
Basos: 1 %
EOS (ABSOLUTE): 0.3 10*3/uL (ref 0.0–0.4)
Eos: 4 %
Hematocrit: 37.8 % (ref 34.0–46.6)
Hemoglobin: 12.7 g/dL (ref 11.1–15.9)
Immature Grans (Abs): 0 10*3/uL (ref 0.0–0.1)
Immature Granulocytes: 0 %
Lymphocytes Absolute: 2.9 10*3/uL (ref 0.7–3.1)
Lymphs: 38 %
MCH: 31.2 pg (ref 26.6–33.0)
MCHC: 33.6 g/dL (ref 31.5–35.7)
MCV: 93 fL (ref 79–97)
Monocytes Absolute: 0.6 10*3/uL (ref 0.1–0.9)
Monocytes: 8 %
Neutrophils Absolute: 3.7 10*3/uL (ref 1.4–7.0)
Neutrophils: 49 %
Platelets: 330 10*3/uL (ref 150–450)
RBC: 4.07 x10E6/uL (ref 3.77–5.28)
RDW: 13.1 % (ref 11.7–15.4)
WBC: 7.6 10*3/uL (ref 3.4–10.8)

## 2018-02-23 LAB — COMPREHENSIVE METABOLIC PANEL
ALT: 20 IU/L (ref 0–32)
AST: 17 IU/L (ref 0–40)
Albumin/Globulin Ratio: 1.4 (ref 1.2–2.2)
Albumin: 4.2 g/dL (ref 3.5–4.8)
Alkaline Phosphatase: 74 IU/L (ref 39–117)
BUN/Creatinine Ratio: 15 (ref 12–28)
BUN: 14 mg/dL (ref 8–27)
Bilirubin Total: 0.4 mg/dL (ref 0.0–1.2)
CO2: 22 mmol/L (ref 20–29)
Calcium: 10 mg/dL (ref 8.7–10.3)
Chloride: 103 mmol/L (ref 96–106)
Creatinine, Ser: 0.92 mg/dL (ref 0.57–1.00)
GFR calc Af Amer: 71 mL/min/{1.73_m2} (ref >59)
GFR calc non Af Amer: 62 mL/min/{1.73_m2} (ref >59)
Globulin, Total: 3 g/dL (ref 1.5–4.5)
Glucose: 101 mg/dL — ABNORMAL HIGH (ref 65–99)
Potassium: 4.4 mmol/L (ref 3.5–5.2)
Sodium: 141 mmol/L (ref 134–144)
Total Protein: 7.2 g/dL (ref 6.0–8.5)

## 2018-02-28 ENCOUNTER — Encounter: Payer: Self-pay | Admitting: Pulmonary Disease

## 2018-02-28 ENCOUNTER — Telehealth: Payer: Self-pay | Admitting: Pulmonary Disease

## 2018-02-28 ENCOUNTER — Ambulatory Visit (INDEPENDENT_AMBULATORY_CARE_PROVIDER_SITE_OTHER): Payer: Medicare Other | Admitting: Pulmonary Disease

## 2018-02-28 VITALS — BP 132/74 | HR 64 | Temp 98.2°F | Ht 61.0 in | Wt 141.0 lb

## 2018-02-28 DIAGNOSIS — R918 Other nonspecific abnormal finding of lung field: Secondary | ICD-10-CM

## 2018-02-28 NOTE — Telephone Encounter (Signed)
Thank you :)

## 2018-02-28 NOTE — Progress Notes (Signed)
 Synopsis: Referred in 01/2018 for a lung mass found on imaging as part of a work up for dyspnea Psoriasis, started on Enbrel 2016 Found to have an incidental pulmonary embolism in 01/2018  Subjective:   PATIENT ID: [name] GENDER: female DOB: 05/26/1945, MRN: [id number]   HPI  Chief Complaint  Patient presents with  . Follow-up    pt c/o chest congestion, prod cough with light green/white mucus.     Lynn had a difficult December as she had a severe cold that made her cough up a lot of mucus.  She says that she was not able to produce the sputum sample for our requested test until several days into taking her antibiotics.  She is here today to follow-up the results of the culture and then determine with neck steps are for her ill-defined pulmonary nodules.  Past Medical History:  Diagnosis Date  . ALLERGIC RHINITIS   . ANEMIA-NOS   . ASTHMA   . Carpal tunnel syndrome   . COLONIC POLYPS, HX OF   . DIABETES MELLITUS, TYPE II    diet controlled  . GERD   . HYPERLIPIDEMIA   . HYPERTENSION   . OSTEOPENIA   . Psoriasis    severe, began soriatane 01/2012  . Rectal fissure   . Scoliosis     Review of Systems  Constitutional: Negative for fever and weight loss.  HENT: Positive for congestion. Negative for ear pain, nosebleeds and sore throat.   Eyes: Negative for redness.  Respiratory: Positive for cough and shortness of breath. Negative for wheezing.   Cardiovascular: Negative for palpitations, leg swelling and PND.  Gastrointestinal: Negative for nausea and vomiting.  Genitourinary: Negative for dysuria.  Skin: Negative for rash.  Neurological: Negative for headaches.  Endo/Heme/Allergies: Does not bruise/bleed easily.  Psychiatric/Behavioral: Negative for depression. The patient is not nervous/anxious.       Objective:  Physical Exam   Vitals:   02/28/18 1206  BP: 132/74  Pulse: 64  Temp: 98.2 F (36.8 C)  TempSrc: Oral  SpO2: 97%  Weight: 141 lb  (64 kg)  Height: 5' 1" (1.549 m)   Gen: well appearing HENT: OP clear, TM's clear, neck supple PULM: few wheezes bases B, normal percussion CV: RRR, no mgr, trace edema GI: BS+, soft, nontender Derm: no cyanosis or rash Psyche: normal mood and affect   CBC    Component Value Date/Time   WBC 7.6 02/22/2018 0956   WBC 8.0 12/23/2017 2339   RBC 4.07 02/22/2018 0956   RBC 4.40 12/23/2017 2339   HGB 12.7 02/22/2018 0956   HCT 37.8 02/22/2018 0956   PLT 330 02/22/2018 0956   MCV 93 02/22/2018 0956   MCH 31.2 02/22/2018 0956   MCH 31.1 12/23/2017 2339   MCHC 33.6 02/22/2018 0956   MCHC 31.9 12/23/2017 2339   RDW 13.1 02/22/2018 0956   LYMPHSABS 2.9 02/22/2018 0956   MONOABS 0.8 12/23/2017 2339   EOSABS 0.3 02/22/2018 0956   BASOSABS 0.1 02/22/2018 0956     Chest imaging: 11/2017 Coronary calcium score 6, mild coronary calcification, LLL mass noted December 22, 2017 CT chest images independently reviewed showing multiple lobular masses of irregular shaped, solid in consistency without spiculation throughout both lungs, the largest of which is in the left lower lobeAnd is 2.7 cm in its widest dimension, a pulmonary embolism was also seen January 01, 2018 PET/CT images independently reviewed showing that the multiple areas of nodularity in the lungs was low and   FDG activity, question inflammatory.  Some low-grade FDG activity in the tonsils.  Question bronchocele  MICRO: 01/2018 Micro: AFB pending  PFT:  Labs: 01/2018 SPEP abnormal protein band detected in gammaglobulins and possible second abnormal protein band in gamma globulins that may represent monoclonal immunoglobulins or light chains 01/2018 UPEP: not observed  Path:  Echo:  Heart Catheterization:  Records from her visit with hematology oncology reviewed where she was seen for the lung nodules which were felt to be benign, she was continued on hypertension treatment and referred to us for further evaluation of the  lung nodules per     Assessment & Plan:   Multiple lung nodules - Plan: CT SUPER D CHEST WO CONTRAST  Discussion: Unfortunately the sputum culture result is going to be useless because she provided it to us after she was on antibiotics.  I still do not know exactly what is causing this very unusual pattern of pulmonary nodules throughout lens chest CT.  So I think the best approach moving forward is to perform a navigational bronchoscopy to get a biopsy and also perform a washing and BAL at the same time.  I remain concerned about the remote possibility of amyloidosis considering the fact that she did have some unusual gammaglobulin spike seen on her SPEP.  Plan: Multiple pulmonary nodules: I think the best approach moving forward is to perform a bronchoscopy We will determine the date of the bronchoscopy and call you You will need to hold Xarelto 48 hours prior to the procedure You should not eat any food or water the day of the procedure We will need to get a CT scan of your chest in order to create the data needed by the navigational software to guide us during this test When the test is performed I will request that my partner physicians will obtain a culture as well as send the tissue biopsy for a [place] red stain to look for a condition called amyloidosis You will be contacted by the anesthesia department prior to the procedure, you should share your concerns with them regarding postoperative nausea and vomiting.  We will see you back in 3 to 4 weeks or sooner if needed  > 50% of this 27 minute visit spent face to face      Current Outpatient Medications:  .  blood glucose meter kit and supplies KIT, Dispense based on patient and insurance preference. ONE TOUCH ULTRA, Disp: 1 each, Rfl: 0 .  Cholecalciferol (VITAMIN D3) 2000 units TABS, Take 2,000 Units by mouth at bedtime. , Disp: , Rfl:  .  clindamycin (CLEOCIN T) 1 % external solution, Apply 1 application topically daily as  needed (cuts)., Disp: 30 mL, Rfl: 1 .  clobetasol (TEMOVATE) 0.05 % external solution, Apply 1 application topically daily. , Disp: , Rfl:  .  Coenzyme Q10 (COQ10) 200 MG CAPS, Take 1 capsule by mouth at bedtime. Gummies, Disp: , Rfl:  .  doxylamine, Sleep, (UNISOM) 25 MG tablet, Take 12.5 mg by mouth at bedtime as needed for sleep. , Disp: , Rfl:  .  ENBREL SURECLICK 50 MG/ML injection, Inject into the skin once a week. Mondays. Patient unsure of dose, Disp: , Rfl:  .  glucose blood (ONE TOUCH ULTRA TEST) test strip, Use test strip to check blood sugar at least 3 times, Disp: 300 each, Rfl: 1 .  halobetasol (ULTRAVATE) 0.05 % cream, Apply 1 application topically daily., Disp: , Rfl:  .  Lancets (ONETOUCH ULTRASOFT) lancets, Use lancet   to test blood sugar at least twice a day or as directed by your physician., Disp: 200 each, Rfl: 11 .  loratadine (CLARITIN REDITABS) 10 MG dissolvable tablet, Take 10 mg by mouth as needed for allergies. , Disp: , Rfl:  .  nebivolol (BYSTOLIC) 2.5 MG tablet, Take 2.5 mg by mouth daily., Disp: , Rfl:  .  olmesartan (BENICAR) 20 MG tablet, Take 20 mg by mouth 2 (two) times daily., Disp: , Rfl:  .  omeprazole (PRILOSEC) 20 MG capsule, Take 20 mg by mouth daily. OTC, Disp: , Rfl:  .  oxymetazoline (AFRIN 12 HOUR) 0.05 % nasal spray, Place 1 spray into the nose 2 (two) times daily. , Disp: , Rfl:  .  Pediatric Multiple Vit-C-FA (MULTIVITAMIN ANIMAL SHAPES, WITH CA/FA,) with C & FA chewable tablet, Chew 1 tablet by mouth at bedtime. , Disp: , Rfl:  .  Pitavastatin Calcium 1 MG TABS, Take 1 mg by mouth every other day., Disp: , Rfl:  .  rivaroxaban (XARELTO) 20 MG TABS tablet, Take 1 tablet (20 mg total) by mouth daily with supper., Disp: 30 tablet, Rfl: 11  

## 2018-02-28 NOTE — Addendum Note (Signed)
Addended by: Len Blalock on: 02/28/2018 01:43 PM   Modules accepted: Orders

## 2018-02-28 NOTE — H&P (View-Only) (Signed)
Synopsis: Referred in 01/2018 for a lung mass found on imaging as part of a work up for dyspnea Psoriasis, started on Enbrel 2016 Found to have an incidental pulmonary embolism in 01/2018  Subjective:   PATIENT ID: [name] GENDER: female DOB: [birth year], MRN: [id number]   HPI  Chief Complaint  Patient presents with  . Follow-up    pt c/o chest congestion, prod cough with light green/white mucus.     [name] had a difficult December as she had a severe cold that made her cough up a lot of mucus.  She says that she was not able to produce the sputum sample for our requested test until several days into taking her antibiotics.  She is here today to follow-up the results of the culture and then determine with neck steps are for her ill-defined pulmonary nodules.  Past Medical History:  Diagnosis Date  . ALLERGIC RHINITIS   . ANEMIA-NOS   . ASTHMA   . Carpal tunnel syndrome   . COLONIC POLYPS, HX OF   . DIABETES MELLITUS, TYPE II    diet controlled  . GERD   . HYPERLIPIDEMIA   . HYPERTENSION   . OSTEOPENIA   . Psoriasis    severe, began soriatane 01/2012  . Rectal fissure   . Scoliosis     Review of Systems  Constitutional: Negative for fever and weight loss.  HENT: Positive for congestion. Negative for ear pain, nosebleeds and sore throat.   Eyes: Negative for redness.  Respiratory: Positive for cough and shortness of breath. Negative for wheezing.   Cardiovascular: Negative for palpitations, leg swelling and PND.  Gastrointestinal: Negative for nausea and vomiting.  Genitourinary: Negative for dysuria.  Skin: Negative for rash.  Neurological: Negative for headaches.  Endo/Heme/Allergies: Does not bruise/bleed easily.  Psychiatric/Behavioral: Negative for depression. The patient is not nervous/anxious.       Objective:  Physical Exam   Vitals:   02/28/18 1206  BP: 132/74  Pulse: 64  Temp: 98.2 F (36.8 C)  TempSrc: Oral  SpO2: 97%  Weight: 141 lb  (64 kg)  Height: 5' 1" (1.549 m)   Gen: well appearing HENT: OP clear, TM's clear, neck supple PULM: few wheezes bases B, normal percussion CV: RRR, no mgr, trace edema GI: BS+, soft, nontender Derm: no cyanosis or rash Psyche: normal mood and affect   CBC    Component Value Date/Time   WBC 7.6 02/22/2018 0956   WBC 8.0 12/23/2017 2339   RBC 4.07 02/22/2018 0956   RBC 4.40 12/23/2017 2339   HGB 12.7 02/22/2018 0956   HCT 37.8 02/22/2018 0956   PLT 330 02/22/2018 0956   MCV 93 02/22/2018 0956   MCH 31.2 02/22/2018 0956   MCH 31.1 12/23/2017 2339   MCHC 33.6 02/22/2018 0956   MCHC 31.9 12/23/2017 2339   RDW 13.1 02/22/2018 0956   LYMPHSABS 2.9 02/22/2018 0956   MONOABS 0.8 12/23/2017 2339   EOSABS 0.3 02/22/2018 0956   BASOSABS 0.1 02/22/2018 0956     Chest imaging: 11/2017 Coronary calcium score 6, mild coronary calcification, LLL mass noted December 22, 2017 CT chest images independently reviewed showing multiple lobular masses of irregular shaped, solid in consistency without spiculation throughout both lungs, the largest of which is in the left lower lobeAnd is 2.7 cm in its widest dimension, a pulmonary embolism was also seen January 01, 2018 PET/CT images independently reviewed showing that the multiple areas of nodularity in the lungs was low and  FDG activity, question inflammatory.  Some low-grade FDG activity in the tonsils.  Question bronchocele  MICRO: 01/2018 Micro: AFB pending  PFT:  Labs: 01/2018 SPEP abnormal protein band detected in gammaglobulins and possible second abnormal protein band in gamma globulins that may represent monoclonal immunoglobulins or light chains 01/2018 UPEP: not observed  Path:  Echo:  Heart Catheterization:  Records from her visit with hematology oncology reviewed where she was seen for the lung nodules which were felt to be benign, she was continued on hypertension treatment and referred to us for further evaluation of the  lung nodules per     Assessment & Plan:   Multiple lung nodules - Plan: CT SUPER D CHEST WO CONTRAST  Discussion: Unfortunately the sputum culture result is going to be useless because she provided it to us after she was on antibiotics.  I still do not know exactly what is causing this very unusual pattern of pulmonary nodules throughout lens chest CT.  So I think the best approach moving forward is to perform a navigational bronchoscopy to get a biopsy and also perform a washing and BAL at the same time.  I remain concerned about the remote possibility of amyloidosis considering the fact that she did have some unusual gammaglobulin spike seen on her SPEP.  Plan: Multiple pulmonary nodules: I think the best approach moving forward is to perform a bronchoscopy We will determine the date of the bronchoscopy and call you You will need to hold Xarelto 48 hours prior to the procedure You should not eat any food or water the day of the procedure We will need to get a CT scan of your chest in order to create the data needed by the navigational software to guide us during this test When the test is performed I will request that my partner physicians will obtain a culture as well as send the tissue biopsy for a [place] red stain to look for a condition called amyloidosis You will be contacted by the anesthesia department prior to the procedure, you should share your concerns with them regarding postoperative nausea and vomiting.  We will see you back in 3 to 4 weeks or sooner if needed  > 50% of this 27 minute visit spent face to face      Current Outpatient Medications:  .  blood glucose meter kit and supplies KIT, Dispense based on patient and insurance preference. ONE TOUCH ULTRA, Disp: 1 each, Rfl: 0 .  Cholecalciferol (VITAMIN D3) 2000 units TABS, Take 2,000 Units by mouth at bedtime. , Disp: , Rfl:  .  clindamycin (CLEOCIN T) 1 % external solution, Apply 1 application topically daily as  needed (cuts)., Disp: 30 mL, Rfl: 1 .  clobetasol (TEMOVATE) 0.05 % external solution, Apply 1 application topically daily. , Disp: , Rfl:  .  Coenzyme Q10 (COQ10) 200 MG CAPS, Take 1 capsule by mouth at bedtime. Gummies, Disp: , Rfl:  .  doxylamine, Sleep, (UNISOM) 25 MG tablet, Take 12.5 mg by mouth at bedtime as needed for sleep. , Disp: , Rfl:  .  ENBREL SURECLICK 50 MG/ML injection, Inject into the skin once a week. Mondays. Patient unsure of dose, Disp: , Rfl:  .  glucose blood (ONE TOUCH ULTRA TEST) test strip, Use test strip to check blood sugar at least 3 times, Disp: 300 each, Rfl: 1 .  halobetasol (ULTRAVATE) 0.05 % cream, Apply 1 application topically daily., Disp: , Rfl:  .  Lancets (ONETOUCH ULTRASOFT) lancets, Use lancet   to test blood sugar at least twice a day or as directed by your physician., Disp: 200 each, Rfl: 11 .  loratadine (CLARITIN REDITABS) 10 MG dissolvable tablet, Take 10 mg by mouth as needed for allergies. , Disp: , Rfl:  .  nebivolol (BYSTOLIC) 2.5 MG tablet, Take 2.5 mg by mouth daily., Disp: , Rfl:  .  olmesartan (BENICAR) 20 MG tablet, Take 20 mg by mouth 2 (two) times daily., Disp: , Rfl:  .  omeprazole (PRILOSEC) 20 MG capsule, Take 20 mg by mouth daily. OTC, Disp: , Rfl:  .  oxymetazoline (AFRIN 12 HOUR) 0.05 % nasal spray, Place 1 spray into the nose 2 (two) times daily. , Disp: , Rfl:  .  Pediatric Multiple Vit-C-FA (MULTIVITAMIN ANIMAL SHAPES, WITH CA/FA,) with C & FA chewable tablet, Chew 1 tablet by mouth at bedtime. , Disp: , Rfl:  .  Pitavastatin Calcium 1 MG TABS, Take 1 mg by mouth every other day., Disp: , Rfl:  .  rivaroxaban (XARELTO) 20 MG TABS tablet, Take 1 tablet (20 mg total) by mouth daily with supper., Disp: 30 tablet, Rfl: 11

## 2018-02-28 NOTE — Telephone Encounter (Signed)
Reviewed the scan - interesting case. I do believe there is at least one good ENB target. Will discuss further w Dr Lake Bells, see when we can perform if pt agrees.

## 2018-02-28 NOTE — Telephone Encounter (Signed)
Per BQ- RB can do the navigational bronch on 03/14/2018 at [facility].  A Super-D CT chest was ordered today in pt's OV.  This has not yet been scheduled.    I have placed an order for [facility] to help schedule the nav bronch at the above listed date/location.    Ria Comment please advise if anything further is needed for me regarding this case.  Thanks!

## 2018-02-28 NOTE — Patient Instructions (Signed)
Multiple pulmonary nodules: I think the best approach moving forward is to perform a bronchoscopy We will determine the date of the bronchoscopy and call you You will need to hold Xarelto 48 hours prior to the procedure You should not eat any food or water the day of the procedure We will need to get a CT scan of your chest in order to create the data needed by the navigational software to guide [place] during this test When the test is performed I will request that my partner physicians will obtain a culture as well as send the tissue biopsy for a [place] red stain to look for a condition called amyloidosis You will be contacted by the anesthesia department prior to the procedure, you should share your concerns with them regarding postoperative nausea and vomiting.  We will see you back in 3 to 4 weeks or sooner if needed

## 2018-02-28 NOTE — Telephone Encounter (Signed)
Please review case for possible navigational bronchoscopy

## 2018-03-01 ENCOUNTER — Telehealth: Payer: Self-pay | Admitting: Pulmonary Disease

## 2018-03-01 NOTE — Telephone Encounter (Signed)
Ok thanks. I called pt and scheduled her an app on 03/20/2018 at 10:00 am. Nothing further is needed.

## 2018-03-01 NOTE — Telephone Encounter (Signed)
Ok to use held spot

## 2018-03-01 NOTE — Telephone Encounter (Signed)
Spoke with pt, she states she is supposed to see Dr. Lake Bells in 3-4 weeks, but BQ has no openings. I asked her if she wanted to see a NP but she refused and said she wanted to see BQ. There are some held spots on 2/4 and 2/5, can I use one of those for pt appt? Super D is on 03/06/2018     Patient Instructions by Juanito Doom, MD at 02/28/2018 11:30 AM  Author: Juanito Doom, MD Author Type: Physician Filed: 02/28/2018 12:53 PM  Note Status: Signed Cosign: Cosign Not Required Encounter Date: 02/28/2018  Editor: Juanito Doom, MD (Physician)    Multiple pulmonary nodules: I think the best approach moving forward is to perform a bronchoscopy We will determine the date of the bronchoscopy and call you You will need to hold Xarelto 48 hours prior to the procedure You should not eat any food or water the day of the procedure We will need to get a CT scan of your chest in order to create the data needed by the navigational software to guide [place] during this test When the test is performed I will request that my partner physicians will obtain a culture as well as send the tissue biopsy for a [place] red stain to look for a condition called amyloidosis You will be contacted by the anesthesia department prior to the procedure, you should share your concerns with them regarding postoperative nausea and vomiting.  We will see you back in 3 to 4 weeks or sooner if needed

## 2018-03-06 ENCOUNTER — Ambulatory Visit (INDEPENDENT_AMBULATORY_CARE_PROVIDER_SITE_OTHER)
Admission: RE | Admit: 2018-03-06 | Discharge: 2018-03-06 | Disposition: A | Discharge status: Home / Self Care | Payer: Medicare Other | Source: Ambulatory Visit | Attending: Pulmonary Disease | Admitting: Pulmonary Disease

## 2018-03-06 DIAGNOSIS — R918 Other nonspecific abnormal finding of lung field: Secondary | ICD-10-CM | POA: Diagnosis not present

## 2018-03-07 NOTE — Pre-Procedure Instructions (Signed)
DEVONIA FARRO  03/07/2018      [facility] at [facility], [place] [address] Phone: [phone] Fax: [phone]  [id number] - [place], [place]. AT [address] [place] [place] [address] Phone: [phone] Fax: [phone]    Your procedure is scheduled on January 29  Report to [facility] at 0830 A.M.  Call this number if you have problems the morning of surgery:  [phone]   Remember:  Do not eat or drink after midnight.    Take these medicines the morning of surgery with A SIP OF WATER  loratadine (CLARITIN REDITABS) nebivolol (BYSTOLIC) omeprazole (PRILOSEC)   7 days prior to surgery STOP taking any Aspirin (unless otherwise instructed by your surgeon), Aleve, Naproxen, Ibuprofen, Motrin, Advil, Goody's, BC's, all herbal medications, fish oil, and all vitamins.  Stop Xarelto 2 days prior to surgery per Dr. Lamonte Sakai     Do not wear jewelry, make-up or nail polish.  Do not wear lotions, powders, or perfumes, or deodorant.  Do not shave 48 hours prior to surgery.   Do not bring valuables to the hospital.  [facility] is not responsible for any belongings or valuables.  Contacts, dentures or bridgework may not be worn into surgery.  Leave your suitcase in the car.  After surgery it may be brought to your room.  For patients admitted to the hospital, discharge time will be determined by your treatment team.  Patients discharged the day of surgery will not be allowed to drive home.    Special instructions:   [facility]- Preparing For Surgery  Before surgery, you can play an important role. Because skin is not sterile, your skin needs to be as free of germs as possible. You can reduce the number of germs on your skin by washing with CHG (chlorahexidine gluconate) Soap before surgery.  CHG  is an antiseptic cleaner which kills germs and bonds with the skin to continue killing germs even after washing.    Oral Hygiene is also important to reduce your risk of infection.  Remember - BRUSH YOUR TEETH THE MORNING OF SURGERY WITH YOUR REGULAR TOOTHPASTE  Please do not use if you have an allergy to CHG or antibacterial soaps. If your skin becomes reddened/irritated stop using the CHG.  Do not shave (including legs and underarms) for at least 48 hours prior to first CHG shower. It is OK to shave your face.  Please follow these instructions carefully.   1. Shower the NIGHT BEFORE SURGERY and the MORNING OF SURGERY with CHG.   2. If you chose to wash your hair, wash your hair first as usual with your normal shampoo.  3. After you shampoo, rinse your hair and body thoroughly to remove the shampoo.  4. Use CHG as you would any other liquid soap. You can apply CHG directly to the skin and wash gently with a scrungie or a clean washcloth.   5. Apply the CHG Soap to your body ONLY FROM THE NECK DOWN.  Do not use on open wounds or open sores. Avoid contact with your eyes, ears, mouth and genitals (private parts). Wash Face and genitals (private parts)  with your normal soap.  6. Wash thoroughly, paying special attention to the area where your surgery will be performed.  7. Thoroughly rinse your body with warm water from  the neck down.  8. DO NOT shower/wash with your normal soap after using and rinsing off the CHG Soap.  9. Pat yourself dry with a CLEAN TOWEL.  10. Wear CLEAN PAJAMAS to bed the night before surgery, wear comfortable clothes the morning of surgery  11. Place CLEAN SHEETS on your bed the night of your first shower and DO NOT SLEEP WITH PETS.    Day of Surgery:  Do not apply any deodorants/lotions.  Please wear clean clothes to the hospital/surgery center.   Remember to brush your teeth WITH YOUR REGULAR TOOTHPASTE.    Please read over the following fact sheets  that you were given.

## 2018-03-08 ENCOUNTER — Ambulatory Visit: Payer: Medicare Other | Admitting: Pulmonary Disease

## 2018-03-08 ENCOUNTER — Other Ambulatory Visit: Payer: Self-pay

## 2018-03-08 ENCOUNTER — Encounter (HOSPITAL_COMMUNITY)
Admission: RE | Admit: 2018-03-08 | Discharge: 2018-03-08 | Disposition: A | Discharge status: Home / Self Care | Payer: Medicare Other | Source: Ambulatory Visit | Attending: Emergency Medicine | Admitting: Emergency Medicine

## 2018-03-08 ENCOUNTER — Encounter (HOSPITAL_COMMUNITY): Payer: Self-pay

## 2018-03-08 DIAGNOSIS — Z01812 Encounter for preprocedural laboratory examination: Secondary | ICD-10-CM | POA: Diagnosis present

## 2018-03-08 HISTORY — DX: Atherosclerotic heart disease of native coronary artery without angina pectoris: I25.10

## 2018-03-08 HISTORY — DX: Other specified postprocedural states: Z98.890

## 2018-03-08 HISTORY — DX: Other specified postprocedural states: R11.2

## 2018-03-08 HISTORY — DX: Unspecified osteoarthritis, unspecified site: M19.90

## 2018-03-08 LAB — COMPREHENSIVE METABOLIC PANEL
ALT: 23 U/L (ref 0–44)
AST: 24 U/L (ref 15–41)
Albumin: 3.8 g/dL (ref 3.5–5.0)
Alkaline Phosphatase: 59 U/L (ref 38–126)
Anion gap: 8 (ref 5–15)
BUN: 13 mg/dL (ref 8–23)
CO2: 26 mmol/L (ref 22–32)
Calcium: 9.3 mg/dL (ref 8.9–10.3)
Chloride: 107 mmol/L (ref 98–111)
Creatinine, Ser: 0.94 mg/dL (ref 0.44–1.00)
GFR calc Af Amer: >60 mL/min (ref >60)
GFR calc non Af Amer: 60 mL/min — ABNORMAL LOW (ref >60)
Glucose, Bld: 113 mg/dL — ABNORMAL HIGH (ref 70–99)
Potassium: 4.2 mmol/L (ref 3.5–5.1)
Sodium: 141 mmol/L (ref 135–145)
Total Bilirubin: 0.4 mg/dL (ref 0.3–1.2)
Total Protein: 7.4 g/dL (ref 6.5–8.1)

## 2018-03-08 LAB — GLUCOSE, CAPILLARY: Glucose-Capillary: 107 mg/dL — ABNORMAL HIGH (ref 70–99)

## 2018-03-08 LAB — HEMOGLOBIN A1C
Hgb A1c MFr Bld: 6.2 % — ABNORMAL HIGH (ref 4.8–5.6)
Mean Plasma Glucose: 131.24 mg/dL

## 2018-03-08 LAB — CBC
HCT: 40.4 % (ref 36.0–46.0)
Hemoglobin: 12.8 g/dL (ref 12.0–15.0)
MCH: 30.5 pg (ref 26.0–34.0)
MCHC: 31.7 g/dL (ref 30.0–36.0)
MCV: 96.4 fL (ref 80.0–100.0)
Platelets: 273 10*3/uL (ref 150–400)
RBC: 4.19 MIL/uL (ref 3.87–5.11)
RDW: 13.4 % (ref 11.5–15.5)
WBC: 8.2 10*3/uL (ref 4.0–10.5)
nRBC: 0 % (ref 0.0–0.2)

## 2018-03-08 NOTE — Progress Notes (Signed)
PCP - Dr. Billey Gosling Cardiologist - Dr. Ena Dawley Pulmonologist- Dr. Simonne Maffucci   Chest x-ray - 02/01/18 EKG - 12/23/17 Stress Test - 01/05/16 ECHO - 02/01/16 Cardiac Cath - denies  Sleep Study - 20+ years ago; needed CPAP then but patient refused to use one.   CBG at PAT: 107 Fasting Blood Sugar - 100-110 Checks Blood Sugar 1 time a day  Blood Thinner Instructions: Xarelto; Hold 48 prior to surgery.  Will need to draw PT/INR and APTT on DOS.   Aspirin Instructions: N/A  Anesthesia review: Yes, mild CAD noted on coronary CT on 12/05/17  Patient denies shortness of breath, fever, cough and chest pain at PAT appointment   Patient verbalized understanding of instructions that were given to them at the PAT appointment. Patient was also instructed that they will need to review over the PAT instructions again at home before surgery.

## 2018-03-09 ENCOUNTER — Telehealth: Payer: Self-pay | Admitting: Pulmonary Disease

## 2018-03-09 NOTE — Telephone Encounter (Signed)
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 Can she use the is day of the procedure?  Are both lungs involved in the procedure because she thought it was only one but from the conversation she had yesterday, it seemed that a biopsy would be done on both lungs.   Can she take her Enbrel injection today or put it off until next week, since it effects her immune system. If she can does  she need to take an antibiotic before the procedure?  She loses bladder control under anesthesia and wondered if she can wear depends during surgery?  How long will surgery last?  BQ please advise.

## 2018-03-09 NOTE — Telephone Encounter (Signed)
Called and spoke with patient, she is aware of response below and verbalized understanding. Nothing further needed.

## 2018-03-09 NOTE — Telephone Encounter (Signed)
Afrin yes Typically just one lung biopsied OK to take Enbrel No antibiotic OK for depends I think, this is a question for anesthesia Procedure typically no longer than an hour

## 2018-03-09 NOTE — Anesthesia Preprocedure Evaluation (Addendum)
Anesthesia Evaluation  Patient identified by MRN, date of birth, ID band Patient awake    Reviewed: Allergy & Precautions, NPO status , Patient's Chart, lab work & pertinent test results, reviewed documented beta blocker date and time   History of Anesthesia Complications (+) PONV and history of anesthetic complications  Airway Mallampati: II  TM Distance: <3 FB Neck ROM: Full    Dental  (+) Teeth Intact, Dental Advisory Given, Caps,    Pulmonary asthma , former smoker,  Lung mass    Pulmonary exam normal breath sounds clear to auscultation       Cardiovascular hypertension, Pt. on home beta blockers and Pt. on medications (-) angina+ CAD  (-) Past MI and (-) Cardiac Stents Normal cardiovascular exam Rhythm:Regular Rate:Normal  Echo 02/01/16: LVEF 65-70%, normal wall thickness and motion, abnormal GLPSS at -16% with anteroseptal strain abnormality, diastolic dysfunction, indeterminate LV filling pressure, aortic valve sclerosis, normal LA size, trivial TR, normal RVSP   Neuro/Psych negative neurological ROS  negative psych ROS   GI/Hepatic Neg liver ROS, GERD  Medicated and Controlled,  Endo/Other  diabetes, Well Controlled, Type 2  Renal/GU negative Renal ROS     Musculoskeletal  (+) Arthritis , Osteoarthritis,  Scoliosis    Abdominal   Peds  Hematology  (+) Blood dyscrasia, anemia ,   Anesthesia Other Findings Day of surgery medications reviewed with the patient.  Reproductive/Obstetrics                          Anesthesia Physical Anesthesia Plan  ASA: III  Anesthesia Plan: General   Post-op Pain Management:    Induction: Intravenous  PONV Risk Score and Plan: 4 or greater and Dexamethasone, Ondansetron, Treatment may vary due to age or medical condition and TIVA  Airway Management Planned: Oral ETT  Additional Equipment:   Intra-op Plan:   Post-operative Plan: Extubation  in OR  Informed Consent: I have reviewed the patients History and Physical, chart, labs and discussed the procedure including the risks, benefits and alternatives for the proposed anesthesia with the patient or authorized representative who has indicated his/her understanding and acceptance.     Dental advisory given  Plan Discussed with: CRNA  Anesthesia Plan Comments:       Anesthesia Quick Evaluation

## 2018-03-12 ENCOUNTER — Telehealth: Payer: Self-pay | Admitting: Pulmonary Disease

## 2018-03-12 NOTE — Telephone Encounter (Signed)
Spoke with pt. States that she cut her hand over the weekend and it's bandaged. The pt's skin is "like paper" and tears very easily. She has been cleaning the area 2 times per day with antibacterial soap and applying antibiotic creams and ointments. Pt wants to make sure that she is still okay to have her bronch on Wednesday.  I spoke with Dr. Lamonte Sakai and he said it would be just fine.  Spoke with pt again. She is aware of what Dr. Lamonte Sakai said. Nothing further was needed at this time.

## 2018-03-14 ENCOUNTER — Ambulatory Visit (HOSPITAL_COMMUNITY): Payer: Medicare Other

## 2018-03-14 ENCOUNTER — Encounter (HOSPITAL_COMMUNITY): Payer: Self-pay | Admitting: *Deleted

## 2018-03-14 ENCOUNTER — Observation Stay (HOSPITAL_COMMUNITY)
Admission: RE | Admit: 2018-03-14 | Discharge: 2018-03-16 | Disposition: A | Discharge status: Home / Self Care | Payer: Medicare Other | Attending: Emergency Medicine | Admitting: Emergency Medicine

## 2018-03-14 ENCOUNTER — Encounter (HOSPITAL_COMMUNITY)
Admission: RE | Disposition: A | Discharge status: Home / Self Care | Payer: Self-pay | Source: Home / Self Care | Attending: Emergency Medicine

## 2018-03-14 ENCOUNTER — Ambulatory Visit (HOSPITAL_COMMUNITY): Payer: Medicare Other | Admitting: Certified Registered Nurse Anesthetist

## 2018-03-14 ENCOUNTER — Ambulatory Visit (HOSPITAL_COMMUNITY): Payer: Medicare Other | Admitting: Physician Assistant

## 2018-03-14 ENCOUNTER — Other Ambulatory Visit: Payer: Self-pay

## 2018-03-14 DIAGNOSIS — I10 Essential (primary) hypertension: Secondary | ICD-10-CM

## 2018-03-14 DIAGNOSIS — K219 Gastro-esophageal reflux disease without esophagitis: Secondary | ICD-10-CM | POA: Diagnosis not present

## 2018-03-14 DIAGNOSIS — Z86711 Personal history of pulmonary embolism: Secondary | ICD-10-CM | POA: Insufficient documentation

## 2018-03-14 DIAGNOSIS — Z801 Family history of malignant neoplasm of trachea, bronchus and lung: Secondary | ICD-10-CM | POA: Diagnosis not present

## 2018-03-14 DIAGNOSIS — I2510 Atherosclerotic heart disease of native coronary artery without angina pectoris: Secondary | ICD-10-CM | POA: Diagnosis not present

## 2018-03-14 DIAGNOSIS — Z885 Allergy status to narcotic agent status: Secondary | ICD-10-CM | POA: Diagnosis not present

## 2018-03-14 DIAGNOSIS — Y848 Other medical procedures as the cause of abnormal reaction of the patient, or of later complication, without mention of misadventure at the time of the procedure: Secondary | ICD-10-CM | POA: Insufficient documentation

## 2018-03-14 DIAGNOSIS — Z9889 Other specified postprocedural states: Secondary | ICD-10-CM | POA: Diagnosis not present

## 2018-03-14 DIAGNOSIS — Z419 Encounter for procedure for purposes other than remedying health state, unspecified: Secondary | ICD-10-CM

## 2018-03-14 DIAGNOSIS — E119 Type 2 diabetes mellitus without complications: Secondary | ICD-10-CM | POA: Diagnosis not present

## 2018-03-14 DIAGNOSIS — Z7901 Long term (current) use of anticoagulants: Secondary | ICD-10-CM | POA: Diagnosis not present

## 2018-03-14 DIAGNOSIS — J45909 Unspecified asthma, uncomplicated: Secondary | ICD-10-CM | POA: Insufficient documentation

## 2018-03-14 DIAGNOSIS — Z79899 Other long term (current) drug therapy: Secondary | ICD-10-CM | POA: Diagnosis not present

## 2018-03-14 DIAGNOSIS — Z881 Allergy status to other antibiotic agents status: Secondary | ICD-10-CM | POA: Diagnosis not present

## 2018-03-14 DIAGNOSIS — J95811 Postprocedural pneumothorax: Secondary | ICD-10-CM | POA: Diagnosis not present

## 2018-03-14 DIAGNOSIS — Z888 Allergy status to other drugs, medicaments and biological substances status: Secondary | ICD-10-CM | POA: Insufficient documentation

## 2018-03-14 DIAGNOSIS — J984 Other disorders of lung: Secondary | ICD-10-CM

## 2018-03-14 DIAGNOSIS — Z87891 Personal history of nicotine dependence: Secondary | ICD-10-CM | POA: Insufficient documentation

## 2018-03-14 DIAGNOSIS — E785 Hyperlipidemia, unspecified: Secondary | ICD-10-CM | POA: Diagnosis not present

## 2018-03-14 DIAGNOSIS — R918 Other nonspecific abnormal finding of lung field: Secondary | ICD-10-CM | POA: Diagnosis present

## 2018-03-14 DIAGNOSIS — L409 Psoriasis, unspecified: Secondary | ICD-10-CM | POA: Diagnosis not present

## 2018-03-14 DIAGNOSIS — I2699 Other pulmonary embolism without acute cor pulmonale: Secondary | ICD-10-CM

## 2018-03-14 DIAGNOSIS — J939 Pneumothorax, unspecified: Secondary | ICD-10-CM | POA: Diagnosis present

## 2018-03-14 HISTORY — PX: VIDEO BRONCHOSCOPY WITH ENDOBRONCHIAL NAVIGATION: SHX6175

## 2018-03-14 LAB — APTT: aPTT: 30 seconds (ref 24–36)

## 2018-03-14 LAB — PROTIME-INR
INR: 1
Prothrombin Time: 13.1 seconds (ref 11.4–15.2)

## 2018-03-14 LAB — GLUCOSE, CAPILLARY
Glucose-Capillary: 106 mg/dL — ABNORMAL HIGH (ref 70–99)
Glucose-Capillary: 102 mg/dL — ABNORMAL HIGH (ref 70–99)
Glucose-Capillary: 165 mg/dL — ABNORMAL HIGH (ref 70–99)

## 2018-03-14 SURGERY — VIDEO BRONCHOSCOPY WITH ENDOBRONCHIAL NAVIGATION
Anesthesia: General

## 2018-03-14 MED ORDER — ONDANSETRON HCL 4 MG/2ML IJ SOLN
INTRAMUSCULAR | Status: AC
  Filled 2018-03-14: qty 2

## 2018-03-14 MED ORDER — SODIUM CHLORIDE 0.9 % IV SOLN
INTRAVENOUS | Status: DC | PRN
  Administered 2018-03-14: 20 ug/min via INTRAVENOUS

## 2018-03-14 MED ORDER — PHENYLEPHRINE 40 MCG/ML (10ML) SYRINGE FOR IV PUSH (FOR BLOOD PRESSURE SUPPORT)
PREFILLED_SYRINGE | INTRAVENOUS | Status: DC | PRN
  Administered 2018-03-14: 80 ug via INTRAVENOUS

## 2018-03-14 MED ORDER — INSULIN ASPART 100 UNIT/ML ~~LOC~~ SOLN
0.0000 [IU] | Freq: Three times a day (TID) | SUBCUTANEOUS | Status: DC
  Administered 2018-03-15: 2 [IU] via SUBCUTANEOUS

## 2018-03-14 MED ORDER — VITAMIN D 25 MCG (1000 UNIT) PO TABS: 2000.0000 [IU] | ORAL_TABLET | Freq: Every day | ORAL | Status: DC

## 2018-03-14 MED ORDER — OLMESARTAN MEDOXOMIL 20 MG PO TABS
20.0000 mg | ORAL_TABLET | Freq: Two times a day (BID) | ORAL | Status: DC
  Administered 2018-03-14 – 2018-03-16 (×4): 20 mg via ORAL
  Filled 2018-03-14 (×4): qty 1

## 2018-03-14 MED ORDER — FENTANYL CITRATE (PF) 250 MCG/5ML IJ SOLN
INTRAMUSCULAR | Status: AC
  Filled 2018-03-14: qty 5

## 2018-03-14 MED ORDER — ONDANSETRON HCL 4 MG/2ML IJ SOLN: 4.0000 mg | Freq: Once | INTRAMUSCULAR | Status: DC | PRN

## 2018-03-14 MED ORDER — FENTANYL CITRATE (PF) 100 MCG/2ML IJ SOLN: 25.0000 ug | INTRAMUSCULAR | Status: DC | PRN

## 2018-03-14 MED ORDER — PROPOFOL 10 MG/ML IV BOLUS
INTRAVENOUS | Status: DC | PRN
  Administered 2018-03-14: 150 mg via INTRAVENOUS

## 2018-03-14 MED ORDER — PROPOFOL 10 MG/ML IV BOLUS
INTRAVENOUS | Status: AC
  Filled 2018-03-14: qty 20

## 2018-03-14 MED ORDER — 0.9 % SODIUM CHLORIDE (POUR BTL) OPTIME
TOPICAL | Status: DC | PRN
  Administered 2018-03-14: 1000 mL

## 2018-03-14 MED ORDER — DEXAMETHASONE SODIUM PHOSPHATE 10 MG/ML IJ SOLN
INTRAMUSCULAR | Status: AC
  Filled 2018-03-14: qty 1

## 2018-03-14 MED ORDER — FENTANYL CITRATE (PF) 100 MCG/2ML IJ SOLN
INTRAMUSCULAR | Status: DC | PRN
  Administered 2018-03-14 (×2): 50 ug via INTRAVENOUS

## 2018-03-14 MED ORDER — ACETAMINOPHEN 325 MG PO TABS
650.0000 mg | ORAL_TABLET | ORAL | Status: DC | PRN
  Administered 2018-03-14 – 2018-03-15 (×2): 650 mg via ORAL
  Filled 2018-03-14 (×3): qty 2

## 2018-03-14 MED ORDER — NEBIVOLOL HCL 2.5 MG PO TABS
2.5000 mg | ORAL_TABLET | Freq: Every day | ORAL | Status: DC
  Administered 2018-03-15 – 2018-03-16 (×2): 2.5 mg via ORAL
  Filled 2018-03-14 (×2): qty 1

## 2018-03-14 MED ORDER — ONDANSETRON HCL 4 MG PO TABS: 4.0000 mg | ORAL_TABLET | Freq: Every day | ORAL | 1 refills | Status: DC | PRN

## 2018-03-14 MED ORDER — RIVAROXABAN 20 MG PO TABS: 20.0000 mg | ORAL_TABLET | Freq: Every day | ORAL | 11 refills | Status: DC

## 2018-03-14 MED ORDER — ROCURONIUM BROMIDE 50 MG/5ML IV SOSY
PREFILLED_SYRINGE | INTRAVENOUS | Status: AC
  Filled 2018-03-14: qty 5

## 2018-03-14 MED ORDER — COQ10 200 MG PO CAPS: 200.0000 mg | ORAL_CAPSULE | Freq: Every day | ORAL | Status: DC

## 2018-03-14 MED ORDER — ONDANSETRON HCL 4 MG/2ML IJ SOLN
INTRAMUSCULAR | Status: DC | PRN
  Administered 2018-03-14: 4 mg via INTRAVENOUS

## 2018-03-14 MED ORDER — PHENYLEPHRINE 40 MCG/ML (10ML) SYRINGE FOR IV PUSH (FOR BLOOD PRESSURE SUPPORT)
PREFILLED_SYRINGE | INTRAVENOUS | Status: AC
  Filled 2018-03-14: qty 10

## 2018-03-14 MED ORDER — LIDOCAINE 2% (20 MG/ML) 5 ML SYRINGE
INTRAMUSCULAR | Status: DC | PRN
  Administered 2018-03-14 (×2): 50 mg via INTRAVENOUS

## 2018-03-14 MED ORDER — PANTOPRAZOLE SODIUM 40 MG PO TBEC
40.0000 mg | DELAYED_RELEASE_TABLET | Freq: Every day | ORAL | Status: DC
  Administered 2018-03-15 – 2018-03-16 (×2): 40 mg via ORAL
  Filled 2018-03-14 (×2): qty 1

## 2018-03-14 MED ORDER — PROPOFOL 500 MG/50ML IV EMUL
INTRAVENOUS | Status: DC | PRN
  Administered 2018-03-14: 125 ug/kg/min via INTRAVENOUS

## 2018-03-14 MED ORDER — ACETAMINOPHEN 325 MG PO TABS
ORAL_TABLET | ORAL | Status: AC
  Filled 2018-03-14: qty 2

## 2018-03-14 MED ORDER — TRAMADOL HCL 50 MG PO TABS
50.0000 mg | ORAL_TABLET | Freq: Once | ORAL | Status: AC
  Administered 2018-03-14: 50 mg via ORAL
  Filled 2018-03-14: qty 1

## 2018-03-14 MED ORDER — EPHEDRINE 5 MG/ML INJ
INTRAVENOUS | Status: AC
  Filled 2018-03-14: qty 10

## 2018-03-14 MED ORDER — SUGAMMADEX SODIUM 200 MG/2ML IV SOLN
INTRAVENOUS | Status: DC | PRN
  Administered 2018-03-14: 200 mg via INTRAVENOUS

## 2018-03-14 MED ORDER — LIDOCAINE 2% (20 MG/ML) 5 ML SYRINGE
INTRAMUSCULAR | Status: AC
  Filled 2018-03-14: qty 15

## 2018-03-14 MED ORDER — FENTANYL CITRATE (PF) 100 MCG/2ML IJ SOLN: 25.0000 ug | Freq: Once | INTRAMUSCULAR | Status: DC

## 2018-03-14 MED ORDER — ONDANSETRON HCL 4 MG/2ML IJ SOLN
4.0000 mg | Freq: Four times a day (QID) | INTRAMUSCULAR | Status: DC | PRN
  Filled 2018-03-14: qty 2

## 2018-03-14 MED ORDER — ROCURONIUM BROMIDE 50 MG/5ML IV SOSY
PREFILLED_SYRINGE | INTRAVENOUS | Status: DC | PRN
  Administered 2018-03-14: 50 mg via INTRAVENOUS
  Administered 2018-03-14: 20 mg via INTRAVENOUS

## 2018-03-14 MED ORDER — DEXAMETHASONE SODIUM PHOSPHATE 10 MG/ML IJ SOLN
INTRAMUSCULAR | Status: DC | PRN
  Administered 2018-03-14: 4 mg via INTRAVENOUS

## 2018-03-14 MED ORDER — IRBESARTAN 150 MG PO TABS
150.0000 mg | ORAL_TABLET | Freq: Every day | ORAL | Status: DC
  Filled 2018-03-14: qty 1

## 2018-03-14 MED ORDER — HEPARIN SODIUM (PORCINE) 5000 UNIT/ML IJ SOLN: 5000.0000 [IU] | Freq: Three times a day (TID) | INTRAMUSCULAR | Status: DC

## 2018-03-14 MED ORDER — NON FORMULARY: 20.0000 mg | Freq: Two times a day (BID) | Status: DC

## 2018-03-14 MED ORDER — LACTATED RINGERS IV SOLN
INTRAVENOUS | Status: DC
  Administered 2018-03-14 (×2): via INTRAVENOUS

## 2018-03-14 SURGICAL SUPPLY — 38 items
ADAPTER BRONCHOSCOPE OLYMPUS (ADAPTER) ×3 IMPLANT
BRUSH CYTOL CELLEBRITY 1.5X140 (MISCELLANEOUS) ×5 IMPLANT
BRUSH SUPERTRAX BIOPSY (INSTRUMENTS) IMPLANT
BRUSH SUPERTRAX NDL-TIP CYTO (INSTRUMENTS) ×3 IMPLANT
CANISTER SUCT 3000ML PPV (MISCELLANEOUS) ×3 IMPLANT
CONT SPEC 4OZ CLIKSEAL STRL BL (MISCELLANEOUS) ×3 IMPLANT
COVER BACK TABLE 60X90IN (DRAPES) ×3 IMPLANT
COVER WAND RF STERILE (DRAPES) ×3 IMPLANT
FILTER STRAW FLUID ASPIR (MISCELLANEOUS) IMPLANT
FORCEPS BIOP SUPERTRX PREMAR (INSTRUMENTS) ×3 IMPLANT
GAUZE SPONGE 4X4 12PLY STRL (GAUZE/BANDAGES/DRESSINGS) ×3 IMPLANT
GLOVE BIO SURGEON STRL SZ7.5 (GLOVE) ×6 IMPLANT
GOWN STRL REUS W/ TWL LRG LVL3 (GOWN DISPOSABLE) ×2 IMPLANT
GOWN STRL REUS W/TWL LRG LVL3 (GOWN DISPOSABLE) ×4
KIT CLEAN ENDO COMPLIANCE (KITS) ×3 IMPLANT
KIT LOCATABLE GUIDE (CANNULA) IMPLANT
KIT MARKER FIDUCIAL DELIVERY (KITS) IMPLANT
KIT PROCEDURE EDGE 180 (KITS) ×2 IMPLANT
KIT TURNOVER KIT B (KITS) ×3 IMPLANT
MARKER SKIN DUAL TIP RULER LAB (MISCELLANEOUS) ×3 IMPLANT
NDL SUPERTRX PREMARK BIOPSY (NEEDLE) ×1 IMPLANT
NEEDLE SUPERTRX PREMARK BIOPSY (NEEDLE) ×3 IMPLANT
NS IRRIG 1000ML POUR BTL (IV SOLUTION) ×3 IMPLANT
OIL SILICONE PENTAX (PARTS (SERVICE/REPAIRS)) ×3 IMPLANT
PAD ARMBOARD 7.5X6 YLW CONV (MISCELLANEOUS) ×6 IMPLANT
PATCHES PATIENT (LABEL) ×9 IMPLANT
SYR 20CC LL (SYRINGE) ×3 IMPLANT
SYR 20ML ECCENTRIC (SYRINGE) ×3 IMPLANT
SYR 50ML SLIP (SYRINGE) ×3 IMPLANT
TOWEL OR 17X24 6PK STRL BLUE (TOWEL DISPOSABLE) ×3 IMPLANT
TRAP SPECIMEN MUCOUS 40CC (MISCELLANEOUS) IMPLANT
TUBE CONNECTING 20'X1/4 (TUBING) ×1
TUBE CONNECTING 20X1/4 (TUBING) ×2 IMPLANT
UNDERPAD 30X30 (UNDERPADS AND DIAPERS) ×3 IMPLANT
VALVE BIOPSY  SINGLE USE (MISCELLANEOUS) ×2
VALVE BIOPSY SINGLE USE (MISCELLANEOUS) ×1 IMPLANT
VALVE SUCTION BRONCHIO DISP (MISCELLANEOUS) ×3 IMPLANT
WATER STERILE IRR 1000ML POUR (IV SOLUTION) ×3 IMPLANT

## 2018-03-14 NOTE — Anesthesia Procedure Notes (Signed)
Procedure Name: Intubation Date/Time: 03/14/2018 10:41 AM Performed by: Inda Coke, CRNA Pre-anesthesia Checklist: Patient identified, Emergency Drugs available, Suction available and Patient being monitored Patient Re-evaluated:Patient Re-evaluated prior to induction Oxygen Delivery Method: Circle System Utilized Preoxygenation: Pre-oxygenation with 100% oxygen Induction Type: IV induction Ventilation: Mask ventilation without difficulty Laryngoscope Size: Mac and 3 Grade View: Grade I Tube type: Oral Tube size: 8.5 mm Number of attempts: 1 Airway Equipment and Method: Stylet and Oral airway Placement Confirmation: ETT inserted through vocal cords under direct vision,  positive ETCO2 and breath sounds checked- equal and bilateral Secured at: 22 cm Tube secured with: Tape Dental Injury: Teeth and Oropharynx as per pre-operative assessment

## 2018-03-14 NOTE — Anesthesia Postprocedure Evaluation (Signed)
Anesthesia Post Note  Patient: [name]  Procedure(s) Performed: VIDEO BRONCHOSCOPY WITH ENDOBRONCHIAL NAVIGATION (N/A )     Patient location during evaluation: PACU Anesthesia Type: General Level of consciousness: awake and alert Pain management: pain level controlled Vital Signs Assessment: post-procedure vital signs reviewed and stable Respiratory status: spontaneous breathing, nonlabored ventilation, respiratory function stable and patient connected to nasal cannula oxygen Cardiovascular status: blood pressure returned to baseline and stable Postop Assessment: no apparent nausea or vomiting Anesthetic complications: no    Last Vitals:  Vitals:   03/14/18 1411 03/14/18 1427  BP: (!) 158/75 (!) 160/88  Pulse: 70 74  Resp: 11 14  Temp:  36.5 C  SpO2: 100% 100%    Last Pain:  Vitals:   03/14/18 1427  TempSrc:   PainSc: 0-No pain                 Ryan P Ellender

## 2018-03-14 NOTE — Transfer of Care (Signed)
Immediate Anesthesia Transfer of Care Note  Patient: [name]  Procedure(s) Performed: VIDEO BRONCHOSCOPY WITH ENDOBRONCHIAL NAVIGATION (N/A )  Patient Location: PACU  Anesthesia Type:General  Level of Consciousness: awake, alert  and oriented  Airway & Oxygen Therapy: Patient Spontanous Breathing and Patient connected to nasal cannula oxygen  Post-op Assessment: Report given to RN and Post -op Vital signs reviewed and stable  Post vital signs: Reviewed and stable  Last Vitals:  Vitals Value Taken Time  BP 143/73 03/14/2018 12:25 PM  Temp    Pulse 75 03/14/2018 12:32 PM  Resp 9 03/14/2018 12:32 PM  SpO2 98 % 03/14/2018 12:32 PM  Vitals shown include unvalidated device data.  Last Pain:  Vitals:   03/14/18 0903  TempSrc:   PainSc: 2       Patients Stated Pain Goal: 3 (07/61/51 8343)  Complications: No apparent anesthesia complications

## 2018-03-14 NOTE — Op Note (Signed)
Video Bronchoscopy with Electromagnetic Navigation Procedure Note  Date of Operation: 03/14/2018  Pre-op Diagnosis: Pulmonary nodules  Post-op Diagnosis: Same  Surgeon: Baltazar Apo  Assistants: None  Anesthesia: General endotracheal anesthesia  Operation: Flexible video fiberoptic bronchoscopy with electromagnetic navigation and biopsies.  Estimated Blood Loss: Minimal  Complications: None apparent  Indications and History: [name] is a 73 y.o. female who is being followed by Dr. Lake Bells in our office for pulmonary nodules, unclear etiology.  Recommendation was made to achieve tissue diagnosis via navigational bronchoscopy.  The risks, benefits, complications, treatment options and expected outcomes were discussed with the patient.  The possibilities of pneumothorax, pneumonia, reaction to medication, pulmonary aspiration, perforation of a viscus, bleeding, failure to diagnose a condition and creating a complication requiring transfusion or operation were discussed with the patient who freely signed the consent.    Description of Procedure: The patient was seen in the Preoperative Area, was examined and was deemed appropriate to proceed.  The patient was taken to OR 10, identified as [name] and the procedure verified as Flexible Video Fiberoptic Bronchoscopy.  A Time Out was held and the above information confirmed.   Prior to the date of the procedure a high-resolution CT scan of the chest was performed. Utilizing [place] a virtual tracheobronchial tree was generated to allow the creation of distinct navigation pathways to the patient's parenchymal abnormalities. After being taken to the operating room general anesthesia was initiated and the patient  was orally intubated. The video fiberoptic bronchoscope was introduced via the endotracheal tube and a general inspection was performed which showed normal airways throughout.  There were no  endobronchial lesions or abnormal secretions seen.. The extendable working channel and locator guide were introduced into the bronchoscope. The distinct navigation pathways prepared prior to this procedure were then utilized to navigate to within 0.6 cm from the center of patient's dominant left lower lobe nodular lesion identified on CT scan, and subsequently 0.8 cm from the center of a less well formed right lower lobe groundglass nodule. The extendable working channel was secured into place and the locator guide was withdrawn. Under fluoroscopic guidance transbronchial brushings were performed at both locations to be sent for cytology. Transbronchial forceps biopsies were performed at the left lower lobe nodule to be sent for pathology and Congo Red staining.  Needle sampling was deferred because the lesions were in the gutter adjacent to the pleura and diaphragm.  A bronchioalveolar lavage was performed in the left lower lobe and sent for cytology and microbiology (bacterial, fungal, AFB smears and cultures). At the end of the procedure a general airway inspection was performed and there was no evidence of active bleeding. The bronchoscope was removed.  The patient tolerated the procedure well. There was no significant blood loss and there were no obvious complications. A post-procedural chest x-ray is pending.  Samples: 1. Transbronchial brushings from LLL nodule 2. Transbronchial brushings from RLL nodule 3. Transbronchial forceps biopsies from LLL nodule 4. Bronchoalveolar lavage from LLL  Plans:  The patient will be discharged from the PACU to home when recovered from anesthesia and after chest x-ray is reviewed. We will review the cytology, pathology and microbiology results with the patient when they become available. Outpatient followup will be with Dr Lake Bells.    Baltazar Apo, MD, PhD 03/14/2018, 12:18 PM [facility] [phone] or if no answer [phone]

## 2018-03-14 NOTE — H&P (Signed)
NAME:  [name], MRN:  [id number], DOB:  [birth year], LOS: 0 ADMISSION DATE:  03/14/2018, CONSULTATION DATE: March 14, 2018 REFERRING MD: Dr. Lamonte Sakai, CHIEF COMPLAINT: Chest pain  Brief History   73 year old female with multiple pulmonary nodules underwent navigational biopsy/trans-bronchial biopsy on March 14, 2018.  Found to have a left-sided pneumothorax after the procedure.  History of present illness   This is a pleasant 73 year old female who I know from clinic who underwent an elective navigational bronchoscopy on January 29th 2020.  She had presented to my clinic in late 2019 with multiple pulmonary nodules.  Sputum culture was obtained which did not grow any particular organisms.  Repeat CT scan of her chest showed persistent pulmonary nodules.  After discussions about ways to obtain a diagnosis we elected to proceed with a navigational bronchoscopy.  She underwent that procedure today and afterward was found to have a small left pneumothorax.  Past Medical History  Hypertension, hyperlipidemia, diabetes mellitus, GERD, asthma  Significant Hospital Events   March 14, 2018 admission  Consults:    Procedures:  Navigational bronchoscopy January 29  Significant Diagnostic Tests:    Micro Data:  January 29 BAL  Antimicrobials:     Interim history/subjective:    Objective   Blood pressure (!) 156/95, pulse 70, temperature 97.7 F (36.5 C), resp. rate 20, height 5' 1" (1.549 m), weight 63.5 kg, SpO2 100 %.        Intake/Output Summary (Last 24 hours) at 03/14/2018 1459 Last data filed at 03/14/2018 1229 Gross per 24 hour  Intake 900 ml  Output 2 ml  Net 898 ml   Filed Weights   03/14/18 1583  Weight: 63.5 kg    Examination:  General:  Resting comfortably in bed HENT: NCAT OP clear PULM: CTA B, normal effort CV: RRR, no mgr GI: BS+, soft, nontender MSK: normal bulk and tone Neuro: awake, alert, no distress, MAEW   Resolved Hospital Problem  list     Assessment & Plan:  Postprocedural pneumothorax: Small, currently hemodynamically stable without respiratory compromise.  Will provide oxygen therapy, close monitoring with repeat chest imaging.  May need chest tube at this point think that is unnecessary  Hypertension: Continue home antihypertensive regimen  Diabetes mellitus: Sliding-scale insulin  Advance diet  Pulmonary embolism 01/2018: Hold Xarelto today, resume tomorrow  Best practice:  Diet: advance Pain/Anxiety/Delirium protocol (if indicated): no VAP protocol (if indicated): n/a DVT prophylaxis: scd GI prophylaxis: n/a Glucose control: SSI Mobility: up ad lib Code Status: full Family Communication: none bedside Disposition:   Labs   CBC: Recent Labs  Lab 03/08/18 0919  WBC 8.2  HGB 12.8  HCT 40.4  MCV 96.4  PLT 094    Basic Metabolic Panel: Recent Labs  Lab 03/08/18 0919  NA 141  K 4.2  CL 107  CO2 26  GLUCOSE 113*  BUN 13  CREATININE 0.94  CALCIUM 9.3   GFR: Estimated Creatinine Clearance: 44.8 mL/min (by C-G formula based on SCr of 0.94 mg/dL). Recent Labs  Lab 03/08/18 0919  WBC 8.2    Liver Function Tests: Recent Labs  Lab 03/08/18 0919  AST 24  ALT 23  ALKPHOS 59  BILITOT 0.4  PROT 7.4  ALBUMIN 3.8   No results for input(s): LIPASE, AMYLASE in the last 168 hours. No results for input(s): AMMONIA in the last 168 hours.  ABG    Component Value Date/Time   TCO2 24 12/18/2015 1819     Coagulation Profile: Recent  Labs  Lab 03/14/18 0920  INR 1.00    Cardiac Enzymes: No results for input(s): CKTOTAL, CKMB, CKMBINDEX, TROPONINI in the last 168 hours.  HbA1C: Hgb A1c MFr Bld  Date/Time Value Ref Range Status  03/08/2018 10:46 AM 6.2 (H) 4.8 - 5.6 % Final    Comment:    (NOTE) Pre diabetes:          5.7%-6.4% Diabetes:              >6.4% Glycemic control for   <7.0% adults with diabetes   10/30/2017 09:22 AM 6.3 4.6 - 6.5 % Final    Comment:     Glycemic Control Guidelines for People with Diabetes:Non Diabetic:  <6%Goal of Therapy: <7%Additional Action Suggested:  >8%     CBG: Recent Labs  Lab 03/08/18 0840 03/14/18 0820 03/14/18 1225  GLUCAP 107* 106* 102*    Review of Systems:   Gen: Denies fever, chills, weight change, fatigue, night sweats HEENT: Denies blurred vision, double vision, hearing loss, tinnitus, sinus congestion, rhinorrhea, sore throat, neck stiffness, dysphagia PULM: per HPI CV: notes some mild left sided chest pain, denies edema, orthopnea, paroxysmal nocturnal dyspnea, palpitations GI: Denies abdominal pain, nausea, vomiting, diarrhea, hematochezia, melena, constipation, change in bowel habits GU: Denies dysuria, hematuria, polyuria, oliguria, urethral discharge Endocrine: Denies hot or cold intolerance, polyuria, polyphagia or appetite change Derm: Denies rash, dry skin, scaling or peeling skin change Heme: Denies easy bruising, bleeding, bleeding gums Neuro: Denies headache, numbness, weakness, slurred speech, loss of memory or consciousness   Past Medical History  She,  has a past medical history of ALLERGIC RHINITIS, ANEMIA-NOS, Arthritis, ASTHMA, Carpal tunnel syndrome, COLONIC POLYPS, HX OF, Coronary artery disease, DIABETES MELLITUS, TYPE II, GERD, HYPERLIPIDEMIA, HYPERTENSION, OSTEOPENIA, PONV (postoperative nausea and vomiting), Psoriasis, Rectal fissure, and Scoliosis.   Surgical History    Past Surgical History:  Procedure Laterality Date  . benign rectal growth  2004   removed by Dr. Zella Richer  . BREAST BIOPSY Right   . BREAST EXCISIONAL BIOPSY Left   . BREAST SURGERY  1988   biopsy  . CESAREAN SECTION       Social History   reports that she quit smoking about 44 years ago. She has a 10.00 pack-year smoking history. She has never used smokeless tobacco. She reports that she does not drink alcohol or use drugs.   Family History   Her family history includes Arthritis in an other  family member; Asthma in her mother and another family member; Breast cancer in an other family member; Breast cancer (age of onset: 37) in her maternal aunt; Heart disease in her mother and another family member; Hypertension in an other family member; Lung cancer in her father; Parkinson's disease in her maternal grandmother; Rheumatic fever in her maternal grandfather.   Allergies Allergies  Allergen Reactions  . Amlodipine Swelling  . Clarithromycin Nausea Only    I can pass out   . Codeine Nausea Only    I can pass out   . Erythromycin     Other reaction(s): Vomiting (intolerance)  . Statins     Muscle pain   . Tetracycline      Home Medications  Prior to Admission medications   Medication Sig Start Date End Date Taking? Authorizing Provider  Cholecalciferol (VITAMIN D3) 2000 units TABS Take 2,000 Units by mouth at bedtime.    Yes [provider]  clindamycin (CLEOCIN T) 1 % external solution Apply 1 application topically daily as needed (  cuts). 01/10/18  Yes Burns, Stacy J, MD  clobetasol (TEMOVATE) 0.05 % external solution Apply 1 application topically daily as needed (psoriasis).  04/17/17 04/17/18 Yes [provider]  Coenzyme Q10 (COQ10) 200 MG CAPS Take 200 mg by mouth at bedtime. Gummies   Yes [provider]  doxylamine, Sleep, (UNISOM) 25 MG tablet Take 12.5 mg by mouth at bedtime.    Yes [provider]  ENBREL SURECLICK 50 MG/ML injection Inject 50 mg into the skin every Monday.  12/05/12  Yes [provider]  halobetasol (ULTRAVATE) 0.05 % cream Apply 1 application topically daily. 10/24/16  Yes [provider]  loratadine (CLARITIN REDITABS) 10 MG dissolvable tablet Take 10 mg by mouth as needed for allergies.    Yes [provider]  nebivolol (BYSTOLIC) 2.5 MG tablet Take 2.5 mg by mouth daily.   Yes [provider]  olmesartan (BENICAR) 20 MG tablet Take 20 mg by mouth 2 (two) times daily.   Yes  [provider]  omeprazole (PRILOSEC) 20 MG capsule Take 20 mg by mouth daily.    Yes [provider]  oxymetazoline (AFRIN 12 HOUR) 0.05 % nasal spray Place 1 spray into the nose 3 (three) times daily as needed for congestion.    Yes [provider]  Pediatric Multiple Vit-C-FA (MULTIVITAMIN ANIMAL SHAPES, WITH CA/FA,) with C & FA chewable tablet Chew 1 tablet by mouth at bedtime.    Yes [provider]  Pitavastatin Calcium 1 MG TABS Take 1 mg by mouth every other day.   Yes [provider]  blood glucose meter kit and supplies KIT Dispense based on patient and insurance preference. ONE TOUCH ULTRA 05/01/17   Burns, Stacy J, MD  glucose blood (ONE TOUCH ULTRA TEST) test strip Use test strip to check blood sugar at least 3 times 05/01/17   Burns, Stacy J, MD  Lancets (ONETOUCH ULTRASOFT) lancets Use lancet to test blood sugar at least twice a day or as directed by your physician. 05/01/17   Burns, Stacy J, MD  ondansetron (ZOFRAN) 4 MG tablet Take 1 tablet (4 mg total) by mouth daily as needed for nausea or vomiting. 03/14/18 03/14/19  Byrum, Robert S, MD  rivaroxaban (XARELTO) 20 MG TABS tablet Take 1 tablet (20 mg total) by mouth daily with supper. OK to restart this medication on 03/15/2018. 03/14/18   Byrum, Robert S, MD    Brent McQuaid, MD [facility] Pager: [phone] Cell: [phone] If no response, call [phone]   

## 2018-03-14 NOTE — Progress Notes (Signed)
PHARMACIST - PHYSICIAN ORDER COMMUNICATION  CONCERNING: P&T Medication Policy on Herbal Medications  DESCRIPTION:  This patient's order for:  Co Q 10  has been noted.  This product(s) is classified as an "herbal" or natural product. Due to a lack of definitive safety studies or FDA approval, nonstandard manufacturing practices, plus the potential risk of unknown drug-drug interactions while on inpatient medications, the Pharmacy and Therapeutics Committee does not permit the use of "herbal" or natural products of this type within [facility].   ACTION TAKEN: The pharmacy department is unable to verify this order at this time and your patient has been informed of this safety policy. Please reevaluate patient's clinical condition at discharge and address if the herbal or natural product(s) should be resumed at that time.   Salayah Meares A. Levada Dy, PharmD, [place] Please utilize Amion for appropriate phone number to reach the unit pharmacist ([place])

## 2018-03-14 NOTE — Interval H&P Note (Signed)
PCCM Interval Note  [name] is 65, followed by Dr. Lake Bells in our office for pulmonary nodular disease.  She has a history of psoriasis on Enbrel, pulmonary embolism in December 2019 for which she is been treated with Xarelto.  She tells me that she was treated recently for a bronchitis, has completed the antibiotics but still has some cough and mucus production.  Otherwise she is stable.  She notes that she does get nauseated when she has general anesthesia and we will be aware of this, probably treat prophylactically with Zofran.  Vitals:   03/14/18 0822  BP: (!) 174/88  Pulse: 64  Resp: 18  Temp: 98 F (36.7 C)  TempSrc: Oral  SpO2: 100%  Weight: 63.5 kg  Height: 5\' 1"  (1.549 m)  Obese pleasant woman, no distress.  Well oriented.  Lungs are clear bilaterally.  Heart regular without a murmur, borderline tachycardic.  Abdomen is obese, soft nondistended with positive bowel sounds.  There is no significant lower extremity edema.  Impression: Bilateral pulmonary nodules of unclear etiology, predominant nodule is in the left lower lobe and appears to be a good target for navigational bronchoscopy.  She had an abnormal protein band on her SPEP and we will make sure to check Congo red staining to evaluate for possible amyloidosis.  Risk and benefits of the procedure discussed with the patient, all questions answered.  She agrees to proceed.  No barriers identified.  She has been off her Xarelto for 4 days.  Baltazar Apo, MD, PhD 03/14/2018, 10:09 AM [facility] [phone] or if no answer [phone]

## 2018-03-15 ENCOUNTER — Observation Stay (HOSPITAL_COMMUNITY): Payer: Medicare Other

## 2018-03-15 ENCOUNTER — Encounter (HOSPITAL_COMMUNITY): Payer: Self-pay | Admitting: Emergency Medicine

## 2018-03-15 DIAGNOSIS — R918 Other nonspecific abnormal finding of lung field: Secondary | ICD-10-CM | POA: Diagnosis not present

## 2018-03-15 DIAGNOSIS — I10 Essential (primary) hypertension: Secondary | ICD-10-CM | POA: Diagnosis not present

## 2018-03-15 DIAGNOSIS — J95811 Postprocedural pneumothorax: Secondary | ICD-10-CM | POA: Diagnosis not present

## 2018-03-15 DIAGNOSIS — I2699 Other pulmonary embolism without acute cor pulmonale: Secondary | ICD-10-CM | POA: Diagnosis not present

## 2018-03-15 DIAGNOSIS — R739 Hyperglycemia, unspecified: Secondary | ICD-10-CM | POA: Diagnosis not present

## 2018-03-15 LAB — BASIC METABOLIC PANEL
Anion gap: 11 (ref 5–15)
BUN: 14 mg/dL (ref 8–23)
CO2: 26 mmol/L (ref 22–32)
Calcium: 9.5 mg/dL (ref 8.9–10.3)
Chloride: 103 mmol/L (ref 98–111)
Creatinine, Ser: 0.91 mg/dL (ref 0.44–1.00)
GFR calc Af Amer: >60 mL/min (ref >60)
GFR calc non Af Amer: >60 mL/min (ref >60)
Glucose, Bld: 150 mg/dL — ABNORMAL HIGH (ref 70–99)
Potassium: 3.8 mmol/L (ref 3.5–5.1)
Sodium: 140 mmol/L (ref 135–145)

## 2018-03-15 LAB — ACID FAST SMEAR (AFB, MYCOBACTERIA): Acid Fast Smear: NEGATIVE

## 2018-03-15 LAB — CBC
HCT: 38.4 % (ref 36.0–46.0)
Hemoglobin: 12.4 g/dL (ref 12.0–15.0)
MCH: 30.5 pg (ref 26.0–34.0)
MCHC: 32.3 g/dL (ref 30.0–36.0)
MCV: 94.3 fL (ref 80.0–100.0)
Platelets: 281 10*3/uL (ref 150–400)
RBC: 4.07 MIL/uL (ref 3.87–5.11)
RDW: 13.5 % (ref 11.5–15.5)
WBC: 12.5 10*3/uL — ABNORMAL HIGH (ref 4.0–10.5)
nRBC: 0 % (ref 0.0–0.2)

## 2018-03-15 LAB — GLUCOSE, CAPILLARY
Glucose-Capillary: 157 mg/dL — ABNORMAL HIGH (ref 70–99)
Glucose-Capillary: 98 mg/dL (ref 70–99)
Glucose-Capillary: 113 mg/dL — ABNORMAL HIGH (ref 70–99)
Glucose-Capillary: 115 mg/dL — ABNORMAL HIGH (ref 70–99)

## 2018-03-15 MED ORDER — RIVAROXABAN 20 MG PO TABS
20.0000 mg | ORAL_TABLET | Freq: Every day | ORAL | Status: DC
  Administered 2018-03-15: 20 mg via ORAL
  Filled 2018-03-15: qty 1

## 2018-03-15 NOTE — H&P (Addendum)
NAME:  [name], MRN:  [id number], DOB:  September 26, 1945, LOS: 0 ADMISSION DATE:  03/14/2018, CONSULTATION DATE: March 14, 2018 REFERRING MD: Dr. Lamonte Sakai, CHIEF COMPLAINT: Chest pain  Brief History   73 year old female with multiple pulmonary nodules underwent navigational biopsy/trans-bronchial biopsy on March 14, 2018.  Found to have a left-sided pneumothorax after the procedure.  Followed by Dr. Lake Bells since late 2019 with multiple pulmoanry nodules.   Sputum culture was obtained which did not grow any particular organisms.  Repeat CT scan of her chest showed persistent pulmonary nodules.  After discussions about ways to obtain a diagnosis we elected to proceed with a navigational bronchoscopy.   Past Medical History  Hypertension, hyperlipidemia, diabetes mellitus, GERD, asthma  Significant Hospital Events   March 14, 2018 admission  Consults:    Procedures:  Navigational Bronchoscopy 1/29   Significant Diagnostic Tests:    Micro Data:  January 29 BAL  Antimicrobials:     Interim history/subjective:  Pt states she wants to do anything to avoid chest tube if possible.  No acute events overnight.   Objective   Blood pressure 119/63, pulse 70, temperature 98.2 F (36.8 C), temperature source Oral, resp. rate 18, height _0  (1.549 m), weight 63.5 kg, SpO2 98 %.        Intake/Output Summary (Last 24 hours) at 03/15/2018 1044 Last data filed at 03/14/2018 2000 Gross per 24 hour  Intake 1140 ml  Output 602 ml  Net 538 ml   Filed Weights   03/14/18 0822  Weight: 63.5 kg    Examination: General: pleasant elderly adult female lying in bed in NAD  HEENT: MM pink/moist, no jvd Neuro: AAOx4, speech clear, MAE  CV: s1s2 rrr, no m/r/g PULM: even/non-labored, lungs bilaterally faint bibasilar crackles, otherwise clear  YB:WLSL, non-tender, bsx4 active  Extremities: warm/dry, no edema  Skin: no rashes or lesions  Resolved Hospital Problem list      Assessment & Plan:   Postprocedural pneumothorax: Small, currently hemodynamically stable without respiratory compromise.   P: O2 for nitrogen wash out > does not have a need for oxygen  No need for chest tube at this point   Hypertension P: Continue home regimen   Diabetes mellitus P: SSI   Pulmonary embolism  -01/2018 P: Resume xarelto   Best practice:  Diet: advance diet as tolerated  Pain/Anxiety/Delirium protocol (if indicated): no VAP protocol (if indicated): n/a DVT prophylaxis: scd GI prophylaxis: n/a Glucose control: SSI Mobility: up ad lib Code Status: full Family Communication:Patient updated at bedside on plan of care Disposition: Observation  Labs   CBC: Recent Labs  Lab 03/15/18 0252  WBC 12.5*  HGB 12.4  HCT 38.4  MCV 94.3  PLT 373    Basic Metabolic Panel: Recent Labs  Lab 03/15/18 0252  NA 140  K 3.8  CL 103  CO2 26  GLUCOSE 150*  BUN 14  CREATININE 0.91  CALCIUM 9.5   GFR: Estimated Creatinine Clearance: 46.3 mL/min (by C-G formula based on SCr of 0.91 mg/dL). Recent Labs  Lab 03/15/18 0252  WBC 12.5*    Liver Function Tests: No results for input(s): AST, ALT, ALKPHOS, BILITOT, PROT, ALBUMIN in the last 168 hours. No results for input(s): LIPASE, AMYLASE in the last 168 hours. No results for input(s): AMMONIA in the last 168 hours.  ABG    Component Value Date/Time   TCO2 24 12/18/2015 1819     Coagulation Profile: Recent Labs  Lab 03/14/18 0920  INR  1.00    Cardiac Enzymes: No results for input(s): CKTOTAL, CKMB, CKMBINDEX, TROPONINI in the last 168 hours.  HbA1C: Hgb A1c MFr Bld  Date/Time Value Ref Range Status  03/08/2018 10:46 AM 6.2 (H) 4.8 - 5.6 % Final    Comment:    (NOTE) Pre diabetes:          5.7%-6.4% Diabetes:              >6.4% Glycemic control for   <7.0% adults with diabetes   10/30/2017 09:22 AM 6.3 4.6 - 6.5 % Final    Comment:    Glycemic Control Guidelines for People with  Diabetes:Non Diabetic:  <6%Goal of Therapy: <7%Additional Action Suggested:  >8%     CBG: Recent Labs  Lab 03/14/18 0820 03/14/18 1225 03/14/18 2120 03/15/18 0810  GLUCAP 106* 102* 165* 157*    Noe Gens, NP-C [facility] Pgr: [phone] or if no answer [phone] 03/15/2018, 10:44 AM  Attending Note:  73 year old female s/p PTX post procedure who is doing very well this AM without complaints.  On exam, lungs are clear.  I reviewed CXR myself, PTX stable.  Discussed with PCCM-NP.  PTX:  - 100% O2  - CXR in AM  Essential HTN:  - Restart home regiment  DM:  - ISS  - CBGs  PE:  - Xarelto  Anticipate D/C in AM if CXR is stable  Patient seen and examined, agree with above note.  I dictated the care and orders written for this patient under my direction.  Rush Farmer, [place]

## 2018-03-15 NOTE — Plan of Care (Signed)

## 2018-03-16 ENCOUNTER — Observation Stay (HOSPITAL_COMMUNITY): Payer: Medicare Other

## 2018-03-16 DIAGNOSIS — R918 Other nonspecific abnormal finding of lung field: Secondary | ICD-10-CM | POA: Diagnosis not present

## 2018-03-16 DIAGNOSIS — J95811 Postprocedural pneumothorax: Secondary | ICD-10-CM | POA: Diagnosis not present

## 2018-03-16 DIAGNOSIS — Z9889 Other specified postprocedural states: Secondary | ICD-10-CM | POA: Diagnosis not present

## 2018-03-16 LAB — GLUCOSE, CAPILLARY: Glucose-Capillary: 93 mg/dL (ref 70–99)

## 2018-03-16 MED ORDER — RIVAROXABAN 20 MG PO TABS: 20.0000 mg | ORAL_TABLET | Freq: Every day | ORAL | 11 refills | Status: DC

## 2018-03-16 NOTE — Discharge Summary (Addendum)
Physician Discharge Summary  Patient ID: [name] MRN: [id number] DOB/AGE: [birth year] 75 y.o.  Admit date: 03/14/2018 Discharge date: 03/16/2018    Discharge Diagnoses:  Pneumothorax  Pulmonary Nodules  Hx PE on Eliquis  DM  HTN                                                                      DISCHARGE PLAN BY DIAGNOSIS      Pneumothorax  Pulmonary Nodules   Discharge Plan: Return to Pulmonary Clinic 2/4 for follow up  Close follow up encouraged given atypical cells on biopsy  May need repeat imaging vs further biopsy pending imaging review (close follow up of nodules)  Hx PE  Discharge Plan: Continue Eliquis   DM  -diet controlled  Discharge Plan: Resume cautious diet + exercise   HTN Discharge Plan: Continue Bystolic, Benicar                     DISCHARGE SUMMARY    73 year old female who was admitted on 03/14/2022 planned biopsy of pulmonary nodules.  Patient is followed by Dr. Lake Bells since late 2019 for pulmonary nodules.  Initial work-up included a sputum culture which was negative.  Follow-up CT imaging demonstrated persistent pulmonary nodules.  After further discussion with patient the decision was made to proceed with navigational bronchoscopy for evaluation.  She underwent navigational bronchoscopy with transbronchial biopsy on 03/14/2018.  Post procedure she was found to have a small left-sided pneumothorax and was admitted for observation.  She was placed on high flow O2 for pneumothorax.  On 1/30 chest x-ray evaluation was notable for stable small pneumothorax.  The patient had slight discomfort in her upper back and shoulder.  Follow-up chest x-ray 1/31 demonstrated resolution of pneumothorax.  Cultures are pending from procedure. Transbronchial needle aspiration of the LLL showed atypical cells. No evidence of amyloid on sampling.  The patient met criteria on 1/31 for discharge.  She has plans for follow-up with outpatient pulmonary as  below.            SIGNIFICANT DIAGNOSTIC STUDIES Super D CT Chest w/o 1/21 >> left lobe nodule again demonstrated, nodule minimally metabolic on comparison PET scan, other branching nodules unchanged  MICRO DATA  1/29 LLL Biopsy >> scant benign lung tissue, no evidence of amyloid 1/29 Transbronchial Needle Aspiration LLL >> atypical cells 1/29 Transbronchial Needle Bx RLL >> no evidence of malignant cells  1/29 AFB >> smear negative >>  1/29 Fungus >>   Discharge Exam: General: elderly female lying in bed in NAD  HEENT: MM pink/moist, no jvd Neuro: AAOx4, speech clear, MAE  CV: s1s2 rrr, no m/r/g PULM: even/non-labored, lungs bilaterally clear, good air movement bilaterally  ZD:GLOV, non-tender, bsx4 active  Extremities: warm/dry, no edema  Skin: no rashes or abrasions   Vitals:   03/15/18 1623 03/15/18 2034 03/16/18 0015 03/16/18 0729  BP: (!) 147/75 (!) 152/79 (!) 153/83 (!) 151/75  Pulse: 68  61 (!) 57  Resp: 18  18   Temp: 98.8 F (37.1 C)  (!) 97.5 F (36.4 C) 97.6 F (36.4 C)  TempSrc:   Oral Oral  SpO2: 100% 100% 100% 100%  Weight:      Height:  Discharge Labs  BMET Recent Labs  Lab 03/15/18 0252  NA 140  K 3.8  CL 103  CO2 26  GLUCOSE 150*  BUN 14  CREATININE 0.91  CALCIUM 9.5    CBC Recent Labs  Lab 03/15/18 0252  HGB 12.4  HCT 38.4  WBC 12.5*  PLT 281    Anti-Coagulation Recent Labs  Lab 03/14/18 0920  INR 1.00    Discharge Instructions    Call MD for:  difficulty breathing, headache or visual disturbances   Complete by:  As directed    Call MD for:  extreme fatigue   Complete by:  As directed    Call MD for:  hives   Complete by:  As directed    Call MD for:  persistant dizziness or light-headedness   Complete by:  As directed    Call MD for:  persistant nausea and vomiting   Complete by:  As directed    Call MD for:  redness, tenderness, or signs of infection (pain, swelling, redness, odor or green/yellow discharge  around incision site)   Complete by:  As directed    Call MD for:  severe uncontrolled pain   Complete by:  As directed    Call MD for:  temperature >100.4   Complete by:  As directed    Diet - low sodium heart healthy   Complete by:  As directed    Diet Carb Modified   Complete by:  As directed    Discharge instructions   Complete by:  As directed    1.  Resume prior home medications as prescribed 2.  Follow up with Dr. Lake Bells as planned on 2/4 3.  It is normal to have cough & occasional bloody sputum production   Discharge patient   Complete by:  As directed    Discharge disposition:  01-Home or Self Care   Discharge patient date:  03/14/2018   Increase activity slowly   Complete by:  As directed        Follow-up Information    Juanito Doom, MD On 03/20/2018.   Specialty:  Pulmonary Disease Why:  10:00am  Contact information: [address] [place]  [address] [phone]            Allergies as of 03/16/2018      Reactions   Amlodipine Swelling   Clarithromycin Nausea Only   I can pass out    Codeine Nausea Only   I can pass out    Erythromycin    Other reaction(s): Vomiting (intolerance)   Statins    Muscle pain    Tetracycline       Medication List    TAKE these medications   AFRIN 12 HOUR 0.05 % nasal spray Generic drug:  oxymetazoline Place 1 spray into the nose 3 (three) times daily as needed for congestion.   blood glucose meter kit and supplies Kit Dispense based on patient and insurance preference. ONE TOUCH ULTRA   clindamycin 1 % external solution Commonly known as:  CLEOCIN T Apply 1 application topically daily as needed (cuts).   clobetasol 0.05 % external solution Commonly known as:  TEMOVATE Apply 1 application topically daily as needed (psoriasis).   CoQ10 200 MG Caps Take 200 mg by mouth at bedtime. Gummies   ENBREL SURECLICK 50 MG/ML injection Generic drug:  etanercept Inject 50 mg into the skin every  Monday.   glucose blood test strip Commonly known as:  ONE TOUCH ULTRA  TEST Use test strip to check blood sugar at least 3 times   halobetasol 0.05 % cream Commonly known as:  ULTRAVATE Apply 1 application topically daily.   loratadine 10 MG dissolvable tablet Commonly known as:  CLARITIN REDITABS Take 10 mg by mouth as needed for allergies.   multivitamin animal shapes (with Ca/FA) with C & FA chewable tablet Chew 1 tablet by mouth at bedtime.   nebivolol 2.5 MG tablet Commonly known as:  BYSTOLIC Take 2.5 mg by mouth daily.   olmesartan 20 MG tablet Commonly known as:  BENICAR Take 20 mg by mouth 2 (two) times daily.   omeprazole 20 MG capsule Commonly known as:  PRILOSEC Take 20 mg by mouth daily.   ondansetron 4 MG tablet Commonly known as:  ZOFRAN Take 1 tablet (4 mg total) by mouth daily as needed for nausea or vomiting.   onetouch ultrasoft lancets Use lancet to test blood sugar at least twice a day or as directed by your physician.   Pitavastatin Calcium 1 MG Tabs Take 1 mg by mouth every other day.   rivaroxaban 20 MG Tabs tablet Commonly known as:  XARELTO Take 1 tablet (20 mg total) by mouth daily with supper. OK to restart this medication on 03/15/2018. What changed:  additional instructions   UNISOM 25 MG tablet Generic drug:  doxylamine (Sleep) Take 12.5 mg by mouth at bedtime.   Vitamin D3 50 MCG (2000 UT) Tabs Take 2,000 Units by mouth at bedtime.         Disposition: Home.  No new home health needs identified.   Discharged Condition: [name] has met maximum benefit of inpatient care and is medically stable and cleared for discharge.  Patient is pending follow up as above.      Time spent on disposition:  35 Minutes.   Signed: Noe Gens, NP-C [facility] Pgr: [phone] Office: [phone]   Attending Note:  Patient ready for discharge.  No events overnight.  Lungs are clear.  I reviewed CXR myself,  no PTX noted.  Discharge instruction as above.  F/U with Dr. Lake Bells in the clinic.  Patient seen and examined, agree with above note.  I dictated the care and orders written for this patient under my direction.  Rush Farmer, [place]

## 2018-03-16 NOTE — Discharge Instructions (Signed)
Flexible Bronchoscopy, Care After This sheet gives you information about how to care for yourself after your test. Your doctor may also give you more specific instructions. If you have problems or questions, contact your doctor. Follow these instructions at home: Eating and drinking  Do not eat or drink anything (not even water) for 2 hours after your test, or until your numbing medicine (local anesthetic) wears off.  When your numbness is gone and your cough and gag reflexes have come back, you may: ? Eat only soft foods. ? Slowly drink liquids.  The day after the test, go back to your normal diet. Driving  Do not drive for 24 hours if you were given a medicine to help you relax (sedative).  Do not drive or use heavy machinery while taking prescription pain medicine. General instructions   Take over-the-counter and prescription medicines only as told by your doctor.  Return to your normal activities as told. Ask what activities are safe for you.  Do not use any products that have nicotine or tobacco in them. This includes cigarettes and e-cigarettes. If you need help quitting, ask your doctor.  Keep all follow-up visits as told by your doctor. This is important. It is very important if you had a tissue sample (biopsy) taken. Get help right away if:  You have shortness of breath that gets worse.  You get light-headed.  You feel like you are going to pass out (faint).  You have chest pain.  You cough up: ? More than a little blood. ? More blood than before. Summary  Do not eat or drink anything (not even water) for 2 hours after your test, or until your numbing medicine wears off.  Do not use cigarettes. Do not use e-cigarettes.  Get help right away if you have chest pain.   Please call our office for any questions or concerns. [phone].   This information is not intended to replace advice given to you by your health care provider. Make sure you discuss any  questions you have with your health care provider. Document Released: 11/28/2008 Document Revised: 02/19/2016 Document Reviewed: 02/19/2016 Elsevier Interactive Patient Education  2019 [employer].  ==========================================================================  Information on my medicine - XARELTO (rivaroxaban)  [place]? Xarelto was prescribed to treat blood clots that were found in your lungs (pulmonary embolism) in November of 2019 and to reduce the risk of them occurring again.  What do you need to know about Xarelto?  Your current dose is one 20 mg tablet taken ONCE A DAY with your evening meal.  DO NOT stop taking Xarelto without talking to the health care provider who prescribed the medication.  Refill your prescription for 20 mg tablets before you run out.  After discharge, you should have regular check-up appointments with your healthcare provider that is prescribing your Xarelto.  In the future your dose may need to be changed if your kidney function changes by a significant amount.  What do you do if you miss a dose? If you are taking Xarelto TWICE DAILY and you miss a dose, take it as soon as you remember. You may take two 15 mg tablets (total 30 mg) at the same time then resume your regularly scheduled 15 mg twice daily the next day.  If you are taking Xarelto ONCE DAILY and you miss a dose, take it as soon as you remember on the same day then continue your regularly scheduled once daily regimen the next  day. Do not take two doses of Xarelto at the same time.   Important Safety Information Xarelto is a blood thinner medicine that can cause bleeding. You should call your healthcare provider right away if you experience any of the following: ? Bleeding from an injury or your nose that does not stop. ? Unusual colored urine (red or dark brown) or unusual colored stools (red or black). ? Unusual bruising for unknown reasons. ? A  serious fall or if you hit your head (even if there is no bleeding).  Some medicines may interact with Xarelto and might increase your risk of bleeding while on Xarelto. To help avoid this, consult your healthcare provider or pharmacist prior to using any new prescription or non-prescription medications, including herbals, vitamins, non-steroidal anti-inflammatory drugs (NSAIDs) and supplements.  This website has more information on Xarelto: [email or web address]

## 2018-03-17 LAB — CULTURE, RESPIRATORY W GRAM STAIN
Culture: NO GROWTH
Gram Stain: NONE SEEN

## 2018-03-20 ENCOUNTER — Encounter: Payer: Self-pay | Admitting: Pulmonary Disease

## 2018-03-20 ENCOUNTER — Ambulatory Visit (INDEPENDENT_AMBULATORY_CARE_PROVIDER_SITE_OTHER): Payer: Medicare Other | Admitting: Pulmonary Disease

## 2018-03-20 VITALS — BP 134/72 | HR 64 | Ht 61.0 in | Wt 139.0 lb

## 2018-03-20 DIAGNOSIS — R918 Other nonspecific abnormal finding of lung field: Secondary | ICD-10-CM | POA: Diagnosis not present

## 2018-03-20 DIAGNOSIS — J410 Simple chronic bronchitis: Secondary | ICD-10-CM

## 2018-03-20 DIAGNOSIS — I2699 Other pulmonary embolism without acute cor pulmonale: Secondary | ICD-10-CM | POA: Diagnosis not present

## 2018-03-20 DIAGNOSIS — L409 Psoriasis, unspecified: Secondary | ICD-10-CM

## 2018-03-20 NOTE — Progress Notes (Signed)
Synopsis: Referred in 01/2018 for a lung mass found on imaging as part of a work up for dyspnea Psoriasis, started on Enbrel 2016 Found to have an incidental pulmonary embolism in 01/2018 Navigational biopsy in January 2020 performed on the left lower lobe nodule showing no evidence of malignancy, Congo red staining was negative, brushings did show atypical cells.  Subjective:   PATIENT ID: [name] GENDER: female DOB: 07/07/1945, MRN: [id number]   HPI  Chief Complaint  Patient presents with  . Follow-up    review Super-D ct, bronchoscopy.     [name] has been doing well since hospital discharge.  Unfortunately she had a pneumothorax which was associated with some chest pain.  However, this is completely resolved.  March 16, 2018 chest x-ray showed resolution.  She no longer has chest pain or shortness of breath.  No problems with cough right now.  She is back to walking, she says that she walked a mile and a half yesterday.  She is here today to go over pathology report.  Past Medical History:  Diagnosis Date  . ALLERGIC RHINITIS   . ANEMIA-NOS   . Arthritis   . ASTHMA   . Carpal tunnel syndrome   . COLONIC POLYPS, HX OF   . Coronary artery disease    "mild CAD" noted on 12/05/17 in coronary CT scan  . DIABETES MELLITUS, TYPE II    diet controlled  . GERD   . HYPERLIPIDEMIA   . HYPERTENSION   . OSTEOPENIA   . PONV (postoperative nausea and vomiting)   . Psoriasis    severe, began soriatane 01/2012  . Rectal fissure   . Scoliosis     Review of Systems  Constitutional: Negative for fever and weight loss.  HENT: Positive for congestion. Negative for ear pain, nosebleeds and sore throat.   Eyes: Negative for redness.  Respiratory: Positive for cough and shortness of breath. Negative for wheezing.   Cardiovascular: Negative for palpitations, leg swelling and PND.  Gastrointestinal: Negative for nausea and vomiting.  Genitourinary: Negative for dysuria.  Skin:  Negative for rash.  Neurological: Negative for headaches.  Endo/Heme/Allergies: Does not bruise/bleed easily.  Psychiatric/Behavioral: Negative for depression. The patient is not nervous/anxious.       Objective:  Physical Exam   Vitals:   03/20/18 0954  BP: 134/72  Pulse: 64  SpO2: 97%  Weight: 139 lb (63 kg)  Height: 5' 1" (1.549 m)    Gen: well appearing HENT: OP clear, TM's clear, neck supple PULM: CTA B, normal percussion CV: RRR, no mgr, trace edema GI: BS+, soft, nontender Derm: no cyanosis or rash Psyche: normal mood and affect   CBC    Component Value Date/Time   WBC 12.5 (H) 03/15/2018 0252   RBC 4.07 03/15/2018 0252   HGB 12.4 03/15/2018 0252   HGB 12.7 02/22/2018 0956   HCT 38.4 03/15/2018 0252   HCT 37.8 02/22/2018 0956   PLT 281 03/15/2018 0252   PLT 330 02/22/2018 0956   MCV 94.3 03/15/2018 0252   MCV 93 02/22/2018 0956   MCH 30.5 03/15/2018 0252   MCHC 32.3 03/15/2018 0252   RDW 13.5 03/15/2018 0252   RDW 13.1 02/22/2018 0956   LYMPHSABS 2.9 02/22/2018 0956   MONOABS 0.8 12/23/2017 2339   EOSABS 0.3 02/22/2018 0956   BASOSABS 0.1 02/22/2018 0956     Chest imaging: 11/2017 Coronary calcium score 6, mild coronary calcification, LLL mass noted December 22, 2017 CT chest images independently  reviewed showing multiple lobular masses of irregular shaped, solid in consistency without spiculation throughout both lungs, the largest of which is in the left lower lobeAnd is 2.7 cm in its widest dimension, a pulmonary embolism was also seen January 01, 2018 PET/CT images independently reviewed showing that the multiple areas of nodularity in the lungs was low and FDG activity, question inflammatory.  Some low-grade FDG activity in the tonsils.  Question bronchocele January 2020 CT chest left lower lobe nodule again demonstrated, other branching nodules unchanged no significant change compared to November 2019  MICRO: 01/2018 Micro: AFB  pending  PFT:  Labs: 01/2018 SPEP abnormal protein band detected in gammaglobulins and possible second abnormal protein band in gamma globulins that may represent monoclonal immunoglobulins or light chains 01/2018 UPEP: not observed  Path: March 14, 2018 surgical pathology and cytology performed Congo red staining was negative, benign lung tissue seen however on 1 cytology report atypical cells were seen (left lower lobe brushing)  Echo:  Heart Catheterization:  Records from her visit with hematology oncology reviewed where she was seen for the lung nodules which were felt to be benign, she was continued on hypertension treatment and referred to [place] for further evaluation of the lung nodules per     Assessment & Plan:   Multiple lung nodules  Abnormal findings on diagnostic imaging of lung - Plan: CT Chest Wo Contrast  Simple chronic bronchitis (HCC)  Psoriasis  Other pulmonary embolism without acute cor pulmonale, unspecified chronicity ([place])  Discussion: Fortunately there was no evidence of malignancy on the left lower lobe biopsy.  There were some atypical cells seen which in her particular case I think are due to an underlying inflammatory problem.  The differential diagnosis of the nodule is broad, including organizing pneumonia, less likely an atypical infection in the setting of Enbrel use.  I do not have a high index of suspicion of malignancy even with the atypical cells so I do not think she needs to proceed with a surgical lung biopsy right now.  However, we do need to keep a close eye on the nodule so we will plan on repeating a CT scan of her chest in April.  If there is any evidence of growth then she will need to have a surgical biopsy.  In regards to her pulmonary embolism I consider it unprovoked and she needs lifelong anticoagulation.  Plan: Unprovoked pulmonary embolism: Continue taking Xarelto as you are doing In May 2020 we can consider reducing the dose to  10 mg daily  Pulmonary nodule: We will follow-up the results of the cultures which were obtained during the bronchoscopy and let she know the results between now and the next visit We need to continue watching this pulmonary nodule with a repeat CT scan of your chest in April 2020  We will plan on seeing you back in April or sooner if needed  Greater than 50% of this 30-minute visit was spent face-to-face      Current Outpatient Medications:  .  blood glucose meter kit and supplies KIT, Dispense based on patient and insurance preference. ONE TOUCH ULTRA, Disp: 1 each, Rfl: 0 .  Cholecalciferol (VITAMIN D3) 2000 units TABS, Take 2,000 Units by mouth at bedtime. , Disp: , Rfl:  .  clindamycin (CLEOCIN T) 1 % external solution, Apply 1 application topically daily as needed (cuts)., Disp: 30 mL, Rfl: 1 .  clobetasol (TEMOVATE) 0.05 % external solution, Apply 1 application topically daily as needed (psoriasis). ,  Disp: , Rfl:  .  Coenzyme Q10 (COQ10) 200 MG CAPS, Take 200 mg by mouth at bedtime. Gummies, Disp: , Rfl:  .  doxylamine, Sleep, (UNISOM) 25 MG tablet, Take 12.5 mg by mouth at bedtime. , Disp: , Rfl:  .  ENBREL SURECLICK 50 MG/ML injection, Inject 50 mg into the skin every Monday. , Disp: , Rfl:  .  glucose blood (ONE TOUCH ULTRA TEST) test strip, Use test strip to check blood sugar at least 3 times, Disp: 300 each, Rfl: 1 .  halobetasol (ULTRAVATE) 0.05 % cream, Apply 1 application topically daily., Disp: , Rfl:  .  Lancets (ONETOUCH ULTRASOFT) lancets, Use lancet to test blood sugar at least twice a day or as directed by your physician., Disp: 200 each, Rfl: 11 .  loratadine (CLARITIN REDITABS) 10 MG dissolvable tablet, Take 10 mg by mouth as needed for allergies. , Disp: , Rfl:  .  nebivolol (BYSTOLIC) 2.5 MG tablet, Take 2.5 mg by mouth daily., Disp: , Rfl:  .  olmesartan (BENICAR) 20 MG tablet, Take 20 mg by mouth 2 (two) times daily., Disp: , Rfl:  .  Pediatric Multiple  Vit-C-FA (MULTIVITAMIN ANIMAL SHAPES, WITH CA/FA,) with C & FA chewable tablet, Chew 1 tablet by mouth at bedtime. , Disp: , Rfl:  .  Pitavastatin Calcium 1 MG TABS, Take 1 mg by mouth every other day., Disp: , Rfl:  .  rivaroxaban (XARELTO) 20 MG TABS tablet, Take 1 tablet (20 mg total) by mouth daily with supper. OK to restart this medication on 03/15/2018., Disp: 30 tablet, Rfl: 11 .  ENBREL SURECLICK 50 MG/ML injection, Inject 50 mg into the skin every Monday. , Disp: , Rfl:  .  glucose blood (ONE TOUCH ULTRA TEST) test strip, Use test strip to check blood sugar at least 3 times, Disp: 300 each, Rfl: 1 .  Lancets (ONETOUCH ULTRASOFT) lancets, Use lancet to test blood sugar at least twice a day or as directed by your physician., Disp: 200 each, Rfl: 11 .  loratadine (CLARITIN REDITABS) 10 MG dissolvable tablet, Take 10 mg by mouth as needed for allergies. , Disp: , Rfl:  .  omeprazole (PRILOSEC) 20 MG capsule, Take 20 mg by mouth daily. , Disp: , Rfl:  .  oxymetazoline (AFRIN 12 HOUR) 0.05 % nasal spray, Place 1 spray into the nose 3 (three) times daily as needed for congestion. , Disp: , Rfl:

## 2018-03-20 NOTE — Patient Instructions (Signed)
Unprovoked pulmonary embolism: Continue taking Xarelto as you are doing In May 2020 we can consider reducing the dose to 10 mg daily  Pulmonary nodule: We will follow-up the results of the cultures which were obtained during the bronchoscopy and let she know the results between now and the next visit We need to continue watching this pulmonary nodule with a repeat CT scan of your chest in April 2020  We will plan on seeing you back in April or sooner if needed

## 2018-03-23 LAB — AFB CULTURE WITH SMEAR (NOT AT ARMC)
Acid Fast Culture: NEGATIVE
Acid Fast Smear: NEGATIVE

## 2018-04-03 ENCOUNTER — Encounter: Payer: Self-pay | Admitting: Physical Therapy

## 2018-04-03 NOTE — Therapy (Signed)
[facility] [address] [place], [place], [address] Phone: [phone]   Fax:  [phone]  Patient Details  Name: [name] MRN: [id number] Date of Birth: Jun 18, 1945 Referring Provider:  No ref. provider found  Encounter Date: 04/03/2018  PHYSICAL THERAPY DISCHARGE SUMMARY  Visits from Start of Care: 9  Current functional level related to goals / functional outcomes: Pt did not return to therapy after MRI   Remaining deficits: See last progress note 07/14/17   Education / Equipment: See HEP  Plan: Patient agrees to discharge.  Patient goals were partially met. Patient is being discharged due to not returning since the last visit.  ?????       Rexanne Mano, PT 04/03/2018, 10:50 AM  [facility] [address] [place], [place], [address] Phone: [phone]   Fax:  [phone]

## 2018-04-12 LAB — FUNGUS CULTURE WITH STAIN

## 2018-04-12 LAB — FUNGUS CULTURE RESULT

## 2018-04-12 LAB — FUNGAL ORGANISM REFLEX

## 2018-04-26 LAB — ACID FAST CULTURE WITH REFLEXED SENSITIVITIES (MYCOBACTERIA): Acid Fast Culture: NEGATIVE

## 2018-04-29 NOTE — Progress Notes (Signed)
Subjective:    Patient ID: [name], female    DOB: [birth year], 73 y.o.   MRN: [id number]  HPI The patient is here for follow up.  Hypertension: She is taking her medication daily. She is compliant with a low sodium diet.  She has mild foot edema. She denies chest pain, palpitations, shortness of breath and regular headaches. She is exercising regularly.  She does monitor her blood pressure at home - 86/59 - 130/81.    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  She is following with Dr Lake Bells and Dr [name].  Dr Lake Bells may reduce her Xarelto dose to 10 mg daily in May.    multiple pulmonary nodules:  She is following with Dr Lake Bells.  She had a biopsy that showed atypical cells, no malignancy and thought to be low risk for malignancy.  Pulmonary will monitor.  Next CT of chest is April 2020.      Diabetes: She is taking her medication daily as prescribed. She is compliant with a diabetic diet. She is exercising regularly - walking.  She checks her feet daily and denies foot lesions. She is up-to-date with an ophthalmology examination.   Hyperlipidemia: She is taking her medication daily. She is compliant with a low fat/cholesterol diet. She is exercising regularly - walking. She denies myalgias.   GERD:  She is taking her medication daily as prescribed.  She denies any GERD symptoms daily, but has occasional GERD.   joint pain:  She has had some joint pain recently, but it has gone away.  She typically does not have joint pain.  She wonders if it could be psoriasic arthritis or RA.  She denies pain right now.  She is on Enbrel.  She was unsure if she should be tested for an autoimmune arthritis.   Dizziness:  She has a history of BPPV and started having dizziness again.  It is intermittent.  She tried the eply maneuver at home and it did not help.  In the past valium has helped.   Medications and allergies reviewed with patient and updated if appropriate.  Patient Active Problem List   Diagnosis Date Noted  . Pneumothorax 03/14/2018  . S/P bronchoscopy with biopsy   . Lung abnormality 01/11/2018  . Pulmonary embolism ([place]) 12/24/2017  . Lung mass 12/24/2017  . HLD (hyperlipidemia) 12/24/2017  . Small vessel disease, cerebrovascular 07/31/2017  . Atypical chest pain 06/14/2017  . Dizziness 06/14/2017  . Easy bruising 05/30/2017  . Hyperuricemia 05/03/2017  . Arthralgia 05/01/2017  . Hair loss 05/01/2017  . Vitamin D deficiency 10/31/2016  . Redness of skin, feet 10/31/2016  . Diastolic dysfunction 92/42/6834  . Vertigo 12/24/2015  . Fatigue 12/24/2015  . Nonspecific abnormal electrocardiogram (ECG) (EKG) 12/24/2015  . Bilateral carotid artery disease, Mild 05/31/2015  . Thyroid nodule 05/31/2015  . Psoriasis   . Scoliosis   . Diabetes type 2, controlled ([place]) 06/03/2008  . Dyslipidemia 06/03/2008  . CARPAL TUNNEL SYNDROME 06/03/2008  . HTN (hypertension) 06/03/2008  . ALLERGIC RHINITIS 06/03/2008  . Asthma 06/03/2008  . GERD (gastroesophageal reflux disease) 06/03/2008  . Osteopenia 06/03/2008  . COLONIC POLYPS, HX OF 06/03/2008    Current Outpatient Medications on File Prior to Visit  Medication Sig Dispense Refill  . blood glucose meter kit and supplies KIT Dispense based on patient and insurance preference. ONE TOUCH ULTRA 1 each 0  .  Cholecalciferol (VITAMIN D3) 2000 units TABS Take 2,000 Units by mouth at bedtime.     . clindamycin (CLEOCIN T) 1 % external solution Apply 1 application topically daily as needed (cuts). 30 mL 1  . Coenzyme Q10 (COQ10) 200 MG CAPS Take 200 mg by mouth at bedtime. Gummies    . doxylamine, Sleep, (UNISOM) 25 MG tablet Take 12.5 mg by mouth at bedtime.     Scarlette Shorts SURECLICK 50 MG/ML injection Inject 50 mg into the skin every Monday.     Marland Kitchen glucose blood (ONE TOUCH ULTRA TEST) test strip Use test strip to check  blood sugar at least 3 times 300 each 1  . halobetasol (ULTRAVATE) 0.05 % cream Apply 1 application topically daily.    . Lancets (ONETOUCH ULTRASOFT) lancets Use lancet to test blood sugar at least twice a day or as directed by your physician. 200 each 11  . loratadine (CLARITIN REDITABS) 10 MG dissolvable tablet Take 10 mg by mouth as needed for allergies.     Marland Kitchen nebivolol (BYSTOLIC) 2.5 MG tablet Take 2.5 mg by mouth daily.    Marland Kitchen olmesartan (BENICAR) 20 MG tablet Take 20 mg by mouth 2 (two) times daily.    Marland Kitchen omeprazole (PRILOSEC) 20 MG capsule Take 20 mg by mouth daily.     Marland Kitchen oxymetazoline (AFRIN 12 HOUR) 0.05 % nasal spray Place 1 spray into the nose 3 (three) times daily as needed for congestion.     . Pediatric Multiple Vit-C-FA (MULTIVITAMIN ANIMAL SHAPES, WITH CA/FA,) with C & FA chewable tablet Chew 1 tablet by mouth at bedtime.     . Pitavastatin Calcium 1 MG TABS Take 1 mg by mouth every other day.    . rivaroxaban (XARELTO) 20 MG TABS tablet Take 1 tablet (20 mg total) by mouth daily with supper. OK to restart this medication on 03/15/2018. 30 tablet 11  . clobetasol (TEMOVATE) 0.05 % external solution Apply 1 application topically daily as needed (psoriasis).      No current facility-administered medications on file prior to visit.     Past Medical History:  Diagnosis Date  . ALLERGIC RHINITIS   . ANEMIA-NOS   . Arthritis   . ASTHMA   . Carpal tunnel syndrome   . COLONIC POLYPS, HX OF   . Coronary artery disease    "mild CAD" noted on 12/05/17 in coronary CT scan  . DIABETES MELLITUS, TYPE II    diet controlled  . GERD   . HYPERLIPIDEMIA   . HYPERTENSION   . OSTEOPENIA   . PONV (postoperative nausea and vomiting)   . Psoriasis    severe, began soriatane 01/2012  . Rectal fissure   . Scoliosis     Past Surgical History:  Procedure Laterality Date  . benign rectal growth  2004   removed by Dr. Zella Richer  . BREAST BIOPSY Right   . BREAST EXCISIONAL BIOPSY Left   .  BREAST SURGERY  1988   biopsy  . CESAREAN SECTION    . VIDEO BRONCHOSCOPY WITH ENDOBRONCHIAL NAVIGATION N/A 03/14/2018   Procedure: VIDEO BRONCHOSCOPY WITH ENDOBRONCHIAL NAVIGATION;  Surgeon: Collene Gobble, MD;  Location: [facility] OR;  Service: Thoracic;  Laterality: N/A;    Social History   Socioeconomic History  . Marital status: Single    Spouse name: Not on file  . Number of children: 1  . Years of education: 59  . Highest education level: Bachelor's degree (e.g., BA, AB, BS)  Occupational History  .  Occupation: retired Careers adviser  Social Needs  . Financial resource strain: Not on file  . Food insecurity:    Worry: Not on file    Inability: Not on file  . Transportation needs:    Medical: Not on file    Non-medical: Not on file  Tobacco Use  . Smoking status: Former Smoker    Packs/day: 1.00    Years: 10.00    Pack years: 10.00    Last attempt to quit: 02/14/1974    Years since quitting: 44.2  . Smokeless tobacco: Never Used  Substance and Sexual Activity  . Alcohol use: No  . Drug use: No  . Sexual activity: Not on file  Lifestyle  . Physical activity:    Days per week: Not on file    Minutes per session: Not on file  . Stress: Not on file  Relationships  . Social connections:    Talks on phone: Not on file    Gets together: Not on file    Attends religious service: Not on file    Active member of club or organization: Not on file    Attends meetings of clubs or organizations: Not on file    Relationship status: Not on file  Other Topics Concern  . Not on file  Social History Narrative   Lives alone in a one story home.  Has one child and one grandchild.  Retired Careers adviser.  Education: college.     Family History  Problem Relation Age of Onset  . Lung cancer Father   . Arthritis Other        Parents  . Asthma Other        parent, other relative  . Breast cancer Other        other  relative  . Hypertension Other        parent, other relative  . Heart disease Other        parent, other relative  . Heart disease Mother   . Asthma Mother   . Breast cancer Maternal Aunt 68  . Parkinson's disease Maternal Grandmother   . Rheumatic fever Maternal Grandfather     Review of Systems  Constitutional: Negative for chills and fever.  Respiratory: Negative for cough, shortness of breath and wheezing.   Cardiovascular: Positive for leg swelling (mild). Negative for chest pain and palpitations.  Gastrointestinal: Positive for nausea (with dizziness). Negative for abdominal pain.  Neurological: Positive for dizziness and headaches (occasional - no change).       Objective:   Vitals:   04/30/18 0819  BP: (!) 172/90  Pulse: 61  Resp: 16  Temp: 98.2 F (36.8 C)  SpO2: 99%   BP Readings from Last 3 Encounters:  04/30/18 (!) 172/90  03/20/18 134/72  03/16/18 (!) 151/75   Wt Readings from Last 3 Encounters:  04/30/18 141 lb 12.8 oz (64.3 kg)  03/20/18 139 lb (63 kg)  03/14/18 140 lb (63.5 kg)   Body mass index is 26.79 kg/m.   Physical Exam    Constitutional: Appears well-developed and well-nourished. No distress.  HENT:  Head: Normocephalic and atraumatic.  Neck: Neck supple. No tracheal deviation present. No thyromegaly present.  No cervical lymphadenopathy Cardiovascular: Normal rate, regular rhythm and normal heart sounds.   No murmur heard. No carotid bruit .  No edema Pulmonary/Chest: Effort normal and breath sounds normal. No respiratory distress. No has no wheezes. No rales.  Skin: Skin  is warm and dry. Not diaphoretic.  Psychiatric: Normal mood and affect. Behavior is normal.      Assessment & Plan:    See Problem List for Assessment and Plan of chronic medical problems.

## 2018-04-29 NOTE — Patient Instructions (Addendum)
  Tests ordered today. Your results will be released to [place] (or called to you) after review, usually within 72hours after test completion. If any changes need to be made, you will be notified at that same time.  Medications reviewed and updated.  Changes include :   Valium as needed for the dizziness. Adjust your BP medication as we discussed and monitor your BP.    Your prescription(s) have been submitted to your pharmacy. Please take as directed and contact our office if you believe you are having problem(s) with the medication(s).   Please followup in 6 months

## 2018-04-30 ENCOUNTER — Other Ambulatory Visit (INDEPENDENT_AMBULATORY_CARE_PROVIDER_SITE_OTHER): Payer: Medicare Other

## 2018-04-30 ENCOUNTER — Ambulatory Visit (INDEPENDENT_AMBULATORY_CARE_PROVIDER_SITE_OTHER): Payer: Medicare Other | Admitting: Internal Medicine

## 2018-04-30 ENCOUNTER — Other Ambulatory Visit: Payer: Self-pay

## 2018-04-30 ENCOUNTER — Other Ambulatory Visit: Payer: Self-pay | Admitting: Internal Medicine

## 2018-04-30 ENCOUNTER — Encounter: Payer: Self-pay | Admitting: Internal Medicine

## 2018-04-30 VITALS — BP 172/90 | HR 61 | Temp 98.2°F | Resp 16 | Ht 61.0 in | Wt 141.8 lb

## 2018-04-30 DIAGNOSIS — M2550 Pain in unspecified joint: Secondary | ICD-10-CM

## 2018-04-30 DIAGNOSIS — K219 Gastro-esophageal reflux disease without esophagitis: Secondary | ICD-10-CM

## 2018-04-30 DIAGNOSIS — R42 Dizziness and giddiness: Secondary | ICD-10-CM

## 2018-04-30 DIAGNOSIS — I10 Essential (primary) hypertension: Secondary | ICD-10-CM

## 2018-04-30 DIAGNOSIS — E785 Hyperlipidemia, unspecified: Secondary | ICD-10-CM | POA: Diagnosis not present

## 2018-04-30 DIAGNOSIS — E1165 Type 2 diabetes mellitus with hyperglycemia: Secondary | ICD-10-CM | POA: Diagnosis not present

## 2018-04-30 DIAGNOSIS — I2699 Other pulmonary embolism without acute cor pulmonale: Secondary | ICD-10-CM

## 2018-04-30 LAB — CBC WITH DIFFERENTIAL/PLATELET
Basophils Absolute: 0 10*3/uL (ref 0.0–0.1)
Basophils Relative: 0.2 % (ref 0.0–3.0)
Eosinophils Absolute: 0.3 10*3/uL (ref 0.0–0.7)
Eosinophils Relative: 4.5 % (ref 0.0–5.0)
HCT: 39.6 % (ref 36.0–46.0)
Hemoglobin: 13.4 g/dL (ref 12.0–15.0)
Lymphocytes Relative: 30.6 % (ref 12.0–46.0)
Lymphs Abs: 2.2 10*3/uL (ref 0.7–4.0)
MCHC: 33.9 g/dL (ref 30.0–36.0)
MCV: 92.6 fl (ref 78.0–100.0)
Monocytes Absolute: 0.6 10*3/uL (ref 0.1–1.0)
Monocytes Relative: 8.3 % (ref 3.0–12.0)
Neutro Abs: 4.1 10*3/uL (ref 1.4–7.7)
Neutrophils Relative %: 56.4 % (ref 43.0–77.0)
Platelets: 305 10*3/uL (ref 150.0–400.0)
RBC: 4.27 Mil/uL (ref 3.87–5.11)
RDW: 14.3 % (ref 11.5–15.5)
WBC: 7.2 10*3/uL (ref 4.0–10.5)

## 2018-04-30 LAB — COMPREHENSIVE METABOLIC PANEL
ALT: 19 U/L (ref 0–35)
AST: 16 U/L (ref 0–37)
Albumin: 4.2 g/dL (ref 3.5–5.2)
Alkaline Phosphatase: 71 U/L (ref 39–117)
BUN: 18 mg/dL (ref 6–23)
CO2: 27 mEq/L (ref 19–32)
Calcium: 10 mg/dL (ref 8.4–10.5)
Chloride: 102 mEq/L (ref 96–112)
Creatinine, Ser: 0.99 mg/dL (ref 0.40–1.20)
GFR: 54.75 mL/min — ABNORMAL LOW (ref >60.00)
Glucose, Bld: 118 mg/dL — ABNORMAL HIGH (ref 70–99)
Potassium: 4.4 mEq/L (ref 3.5–5.1)
Sodium: 139 mEq/L (ref 135–145)
Total Bilirubin: 0.8 mg/dL (ref 0.2–1.2)
Total Protein: 7.7 g/dL (ref 6.0–8.3)

## 2018-04-30 LAB — LIPID PANEL
Cholesterol: 262 mg/dL — ABNORMAL HIGH (ref 0–200)
HDL: 49.2 mg/dL (ref >39.00)
NonHDL: 213.19
Total CHOL/HDL Ratio: 5
Triglycerides: 227 mg/dL — ABNORMAL HIGH (ref 0.0–149.0)
VLDL: 45.4 mg/dL — ABNORMAL HIGH (ref 0.0–40.0)

## 2018-04-30 LAB — HEMOGLOBIN A1C: Hgb A1c MFr Bld: 6.4 % (ref 4.6–6.5)

## 2018-04-30 LAB — LDL CHOLESTEROL, DIRECT: Direct LDL: 165 mg/dL

## 2018-04-30 MED ORDER — NEBIVOLOL HCL 2.5 MG PO TABS: 2.5000 mg | ORAL_TABLET | Freq: Every day | ORAL | 1 refills | Status: DC

## 2018-04-30 MED ORDER — DIAZEPAM 2 MG PO TABS: 2.0000 mg | ORAL_TABLET | Freq: Four times a day (QID) | ORAL | 0 refills | Status: DC | PRN

## 2018-04-30 MED ORDER — OLMESARTAN MEDOXOMIL 20 MG PO TABS: 20.0000 mg | ORAL_TABLET | Freq: Two times a day (BID) | ORAL | 1 refills | Status: DC

## 2018-04-30 NOTE — Assessment & Plan Note (Signed)
BP elevated here today, but actually on the low side at home and she does not feel well with BP so low She is sensitive to the medication and we have had difficulty finding medications she tolerates Continue bystolic daily She will try benicar BID, QD alternating to see if that helps She will continue to monitor her BP closely F/u with Dr [name] next month, but can call with any concerns cmp

## 2018-04-30 NOTE — Assessment & Plan Note (Signed)
Have vertigo - BPPV Tried eply at home x 3 - did not help Does not want to do PT due to coronavirus now Valium has helped in past - will renew - she will use it only as needed

## 2018-04-30 NOTE — Assessment & Plan Note (Signed)
Taking pitavastatin twice daily-has not been able to tolerate it more than that Lipid, cmp

## 2018-04-30 NOTE — Telephone Encounter (Signed)
Copied from [place] [phone]. Topic: Quick Communication - Rx Refill/Question >> Apr 30, 2018  2:29 PM [place], [place] D wrote: Medication: olmesartan (BENICAR) 20 MG tablet / Pharmacy stated pt requested a 90 day supply and only 45 days were sent in. Please advise.   Has the patient contacted their pharmacy? Yes.   (Agent: If no, request that the patient contact the pharmacy for the refill.) (Agent: If yes, when and what did the pharmacy advise?)  Preferred Pharmacy (with phone number or street name): [facility] at [facility], [place] - [address] [address] [phone] (Phone) [phone] (Fax)  Agent: Please be advised that RX refills may take up to 3 business days. We ask that you follow-up with your pharmacy.

## 2018-04-30 NOTE — Assessment & Plan Note (Signed)
Has had some joint pain, but has not been persistent and currently has no pain She has psoriasis and wondered if she should be tested for psoriatic arthritis or rheumatoid arthritis Since she has no pain we will hold off on testing If pain recurs will test or refer to rheumatology Currently on Enbrel by dermatology

## 2018-04-30 NOTE — Assessment & Plan Note (Signed)
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

## 2018-04-30 NOTE — Assessment & Plan Note (Signed)
Diet controlled Compliant with a diabetic diet Walking for exercise Check A1c

## 2018-04-30 NOTE — Assessment & Plan Note (Signed)
GERD controlled Continue daily medication  

## 2018-05-01 MED ORDER — OLMESARTAN MEDOXOMIL 20 MG PO TABS: 20.0000 mg | ORAL_TABLET | Freq: Two times a day (BID) | ORAL | 1 refills | Status: DC

## 2018-05-01 NOTE — Addendum Note (Signed)
Addended by: Johney Frame, Adelina Mings M on: 05/01/2018 03:00 PM   Modules accepted: Orders

## 2018-05-01 NOTE — Telephone Encounter (Signed)
90 day supply with 1 refill sent to [facility]. Pt informed of same.

## 2018-05-23 ENCOUNTER — Telehealth: Payer: Self-pay | Admitting: *Deleted

## 2018-05-23 NOTE — Telephone Encounter (Signed)
Virtual Visit Pre-Appointment Phone Call  Steps For Call:  1. Confirm consent - "In the setting of the current Covid19 crisis, you are scheduled for a (phone ) visit with your Dr. [name] on 06/08/18 at 8 am .  Just as we do with many in-office visits, in order for you to participate in this visit, we must obtain consent.  If you'd like, I can send this to your mychart (if signed up) or email for you to review.  Otherwise, I can obtain your verbal consent now.  All virtual visits are billed to your insurance company just like a normal visit would be.  By agreeing to a virtual visit, we'd like you to understand that the technology does not allow for your provider to perform an examination, and thus may limit your provider's ability to fully assess your condition.  Finally, though the technology is pretty good, we cannot assure that it will always work on either your or our end, and in the setting of a video visit, we may have to convert it to a phone-only visit.  In either situation, we cannot ensure that we have a secure connection.  Are you willing to proceed?"   2. Advise patient to be prepared with any vital sign or heart rhythm information, their current medicines, and a piece of paper and pen handy for any instructions they may receive the day of their visit  3. Inform patient they will receive a phone call 15 minutes prior to their appointment time (may be from unknown caller ID) so they should be prepared to answer  4. Confirm that appointment type is correct in Epic appointment notes (video vs telephone)    TELEPHONE CALL NOTE  [name] has been deemed a candidate for a follow-up tele-health visit to limit community exposure during the Covid-19 pandemic. I spoke with the patient via phone to ensure availability of phone/video source, confirm preferred email & phone number, and discuss instructions and expectations.  I reminded [name] to be prepared with any vital  sign and/or heart rhythm information that could potentially be obtained via home monitoring, at the time of her visit. I reminded [name] to expect a phone call at the time of her visit if her visit.  Did the patient verbally acknowledge consent to treatment? PATIENT GAVE VERBAL CONSENT FOR DR NELSON TO DO A REGULAR TELEPHONE VISIT ON THE PATIENT FOR 06/07/12 AT 8 AM-PT DOES NOT HAVE A SMARTPHONE OR A COMPUTER-CAN DO TELEPHONE ONLY.   Caitlin Alpha, LPN 02/19/1094 0:45 PM     CONSENT FOR TELE-HEALTH VISIT - PLEASE REVIEW  I hereby voluntarily request, consent and authorize [facility] and its employed or contracted physicians, physician assistants, nurse practitioners or other licensed health care professionals (the Practitioner), to provide me with telemedicine health care services (the Services") as deemed necessary by the treating Practitioner. I acknowledge and consent to receive the Services by the Practitioner via telemedicine. I understand that the telemedicine visit will involve communicating with the Practitioner through live audiovisual communication technology and the disclosure of certain medical information by electronic transmission. I acknowledge that I have been given the opportunity to request an in-person assessment or other available alternative prior to the telemedicine visit and am voluntarily participating in the telemedicine visit.  I understand that I have the right to withhold or withdraw my consent to the use of telemedicine in the course of my care at any time, without affecting my right  to future care or treatment, and that the Practitioner or I may terminate the telemedicine visit at any time. I understand that I have the right to inspect all information obtained and/or recorded in the course of the telemedicine visit and may receive copies of available information for a reasonable fee.  I understand that some of the potential risks of receiving the Services  via telemedicine include:   Delay or interruption in medical evaluation due to technological equipment failure or disruption;  Information transmitted may not be sufficient (e.g. poor resolution of images) to allow for appropriate medical decision making by the Practitioner; and/or   In rare instances, security protocols could fail, causing a breach of personal health information.  Furthermore, I acknowledge that it is my responsibility to provide information about my medical history, conditions and care that is complete and accurate to the best of my ability. I acknowledge that Practitioner's advice, recommendations, and/or decision may be based on factors not within their control, such as incomplete or inaccurate data provided by me or distortions of diagnostic images or specimens that may result from electronic transmissions. I understand that the practice of medicine is not an exact science and that Practitioner makes no warranties or guarantees regarding treatment outcomes. I acknowledge that I will receive a copy of this consent concurrently upon execution via email to the email address I last provided but may also request a printed copy by calling the office of [facility].    I understand that my insurance will be billed for this visit.   I have read or had this consent read to me.  I understand the contents of this consent, which adequately explains the benefits and risks of the Services being provided via telemedicine.   I have been provided ample opportunity to ask questions regarding this consent and the Services and have had my questions answered to my satisfaction.  I give my informed consent for the services to be provided through the use of telemedicine in my medical care  By participating in this telemedicine visit I agree to the above.

## 2018-05-23 NOTE — Telephone Encounter (Signed)
Patient called and refused to come out for CT scan due to current COVID pandemic. Patient rescheduled for July 1.

## 2018-05-29 ENCOUNTER — Ambulatory Visit: Payer: Medicare Other | Admitting: Pulmonary Disease

## 2018-05-31 ENCOUNTER — Inpatient Hospital Stay: Admission: RE | Admit: 2018-05-31 | Payer: Medicare Other | Source: Ambulatory Visit

## 2018-06-04 ENCOUNTER — Ambulatory Visit: Payer: Medicare Other | Admitting: Pulmonary Disease

## 2018-06-08 ENCOUNTER — Telehealth: Payer: Self-pay | Admitting: Cardiology

## 2018-06-08 ENCOUNTER — Other Ambulatory Visit: Payer: Self-pay

## 2018-06-08 ENCOUNTER — Encounter: Payer: Self-pay | Admitting: Cardiology

## 2018-06-08 ENCOUNTER — Telehealth (INDEPENDENT_AMBULATORY_CARE_PROVIDER_SITE_OTHER): Payer: Medicare Other | Admitting: Cardiology

## 2018-06-08 DIAGNOSIS — I2609 Other pulmonary embolism with acute cor pulmonale: Secondary | ICD-10-CM | POA: Diagnosis not present

## 2018-06-08 DIAGNOSIS — I10 Essential (primary) hypertension: Secondary | ICD-10-CM

## 2018-06-08 DIAGNOSIS — E782 Mixed hyperlipidemia: Secondary | ICD-10-CM

## 2018-06-08 DIAGNOSIS — R0609 Other forms of dyspnea: Secondary | ICD-10-CM

## 2018-06-08 DIAGNOSIS — I2782 Chronic pulmonary embolism: Secondary | ICD-10-CM | POA: Diagnosis not present

## 2018-06-08 DIAGNOSIS — R0600 Dyspnea, unspecified: Secondary | ICD-10-CM

## 2018-06-08 DIAGNOSIS — I25118 Atherosclerotic heart disease of native coronary artery with other forms of angina pectoris: Secondary | ICD-10-CM

## 2018-06-08 DIAGNOSIS — Z789 Other specified health status: Secondary | ICD-10-CM

## 2018-06-08 MED ORDER — PITAVASTATIN CALCIUM 1 MG PO TABS: 1.0000 mg | ORAL_TABLET | ORAL | 1 refills | Status: DC

## 2018-06-08 MED ORDER — PITAVASTATIN CALCIUM 1 MG PO TABS: 1.0000 mg | ORAL_TABLET | ORAL | 2 refills | Status: DC

## 2018-06-08 MED ORDER — EZETIMIBE 10 MG PO TABS: 10.0000 mg | ORAL_TABLET | Freq: Every day | ORAL | 2 refills | Status: DC

## 2018-06-08 MED FILL — ZETIA 10 MG TABLET: 10 | 90 days supply | Qty: 90 | Fill #0

## 2018-06-08 MED FILL — CLOBETASOL 0.05% SOLUTION: 0.05 | 30 days supply | Qty: 50 | Fill #0

## 2018-06-08 MED FILL — LIVALO 1 MG TABLET: 1 | 90 days supply | Qty: 36 | Fill #0

## 2018-06-08 NOTE — Patient Instructions (Addendum)
Medication Instructions:   INCREASE YOUR PITAVASTATIN  (LIVALO) 1 MG TABS TO TAKING IT 3 TIMES WEEKLY  START TAKING ZETIA 10 MG BY MOUTH DAILY  If you need a refill on your cardiac medications before your next appointment, please call your pharmacy.    Lab work:  IS SCHEDULED AT OUR OFFICE ON Monday 09/03/18 TO CHECK CMET AND LIPIDS--PLEASE COME FASTING TO THIS LAB APPOINTMENT  If you have labs (blood work) drawn today and your tests are completely normal, you will receive your results only by: Marland Kitchen MyChart Message (if you have MyChart) OR . A paper copy in the mail If you have any lab test that is abnormal or we need to change your treatment, we will call you to review the results.    Follow-Up:  AS ANOTHER TELEPHONE VIRTUAL VISIT WITH DR [name] ON Friday 09/07/18 AT 8:40 AM--YOU WILL HAVE YOUR LABS DONE AT OUR OFFICE ON THE Monday PRIOR TO THIS VISIT ON 09/03/18.

## 2018-06-08 NOTE — Telephone Encounter (Signed)
Spoke with [name] at [facility] outpatient and informed her that yes the pts Livalo should be increased from taking it 2 times weekly to taking it now 3 times weekly, per Dr [name]. [name] verbalized understanding and agrees with this plan.  [name] will fill this now for the pt.

## 2018-06-08 NOTE — Addendum Note (Signed)
Addended by: Nuala Alpha on: 06/08/2018 12:28 PM   Modules accepted: Orders

## 2018-06-08 NOTE — Telephone Encounter (Signed)
5.Hyperlipidemia -increasepivastatin to 3x/week, add zetia, repeat lipids prior to the next visit in 3 months, she is very nervous to be on two different biological drugs in the settings of pandemia. We will recheck her lipids prior to the next visit and discuss with our lipid clinic.

## 2018-06-08 NOTE — Telephone Encounter (Signed)
New Message    Benjamine Mola has questions about a prescription that was sent over today    Please call

## 2018-06-08 NOTE — Progress Notes (Signed)
Virtual Visit via Video Note   This visit type was conducted due to national recommendations for restrictions regarding the COVID-19 Pandemic (e.g. social distancing) in an effort to limit this patient's exposure and mitigate transmission in our community.  Due to her co-morbid illnesses, this patient is at least at moderate risk for complications without adequate follow up.  This format is felt to be most appropriate for this patient at this time.  All issues noted in this document were discussed and addressed.  A limited physical exam was performed with this format.  Please refer to the patient's chart for her consent to telehealth for [facility].   Evaluation Performed:  Follow-up visit  Date:  06/08/2018   ID:  [name], [name] 25-May-1945, MRN [id number]  Patient Location: Home Provider Location: Home  PCP:  [name], MD  Cardiologist:  Ena Dawley, MD  Electrophysiologist:  None   Chief Complaint:   History of Present Illness:    [name] is a 73 y.o. female with hx of difficult to control hypertension, hyperlipidemia, who is coming for concern of worsening shortness of breath on exertion, she has been having difficulties controlling her blood pressure, now managed well by Dr. Quay Burow, she was previously on amlodipine that caused lower extremity edema, her blood pressure could be as high as 200, also any stool low she gets dizzy and she was experiencing falls. She states that since his blood pressure has been better controlled her shortness of breath has improved but is still persistent.  She can only walk about quarter mile before she has to stop.  She has also developed unusual redness in her toes, denies any claudications. She has recently underwent brain MRI that showed progressive microvascular disease.  02/22/2018 -this is a follow-up, the patient underwent coronary CTA that showed mild nonobstructive CAD.  She was started on pitavastatin 1 mg, she  previously did not tolerate pravastatin and atorvastatin.  She developed hip pain and back pain and discontinued pitavastatin with some improvement of symptoms, she is willing to retry again.  She was also found to have a lung mass in her left lower lobe, full chest CT showed multiple other masses however PET CT was negative for malignancy.  She is now following with pulmonary Dr. Lake Bells who wants to perform bronchoscopy in the near future.  She denies any chest pain.  She recently had bronchitis and still is recovering from that continues to have productive cough.  She was given Levaquin by Dr. Lake Bells.  06/08/2018 - 3 months follow up, she underwent bronchoscopy that didn't show malignancy. BP well control, occassional dizziness, no falls, she walks up to 2 miles a day with no chest pain and minimal SOB. No fever. She tried to [employer], but daily livalo causes severe back pains. Now down to 2x/week. Minimal RLE edema, no pain.   The patient does not have symptoms concerning for COVID-19 infection (fever, chills, cough, or new shortness of breath).    Past Medical History:  Diagnosis Date  . ALLERGIC RHINITIS   . ANEMIA-NOS   . Arthritis   . ASTHMA   . Carpal tunnel syndrome   . COLONIC POLYPS, HX OF   . Coronary artery disease    "mild CAD" noted on 12/05/17 in coronary CT scan  . DIABETES MELLITUS, TYPE II    diet controlled  . GERD   . HYPERLIPIDEMIA   . HYPERTENSION   . OSTEOPENIA   . PONV (postoperative  nausea and vomiting)   . Psoriasis    severe, began soriatane 01/2012  . Rectal fissure   . Scoliosis    Past Surgical History:  Procedure Laterality Date  . benign rectal growth  2004   removed by Dr. Zella Richer  . BREAST BIOPSY Right   . BREAST EXCISIONAL BIOPSY Left   . BREAST SURGERY  1988   biopsy  . CESAREAN SECTION    . VIDEO BRONCHOSCOPY WITH ENDOBRONCHIAL NAVIGATION N/A 03/14/2018   Procedure: VIDEO BRONCHOSCOPY WITH ENDOBRONCHIAL NAVIGATION;  Surgeon:  Collene Gobble, MD;  Location: [facility] OR;  Service: Thoracic;  Laterality: N/A;     Current Meds  Medication Sig  . blood glucose meter kit and supplies KIT Dispense based on patient and insurance preference. ONE TOUCH ULTRA  . Cholecalciferol (VITAMIN D3) 2000 units TABS Take 2,000 Units by mouth at bedtime.   . clindamycin (CLEOCIN T) 1 % external solution Apply 1 application topically daily as needed (cuts).  . Coenzyme Q10 (COQ10) 200 MG CAPS Take 200 mg by mouth at bedtime. Gummies  . diazepam (VALIUM) 2 MG tablet Take 1 tablet (2 mg total) by mouth every 6 (six) hours as needed (dizziness).  . diphenhydrAMINE HCl, Sleep, 25 MG TBDP Take 12 mg by mouth at bedtime.  Scarlette Shorts SURECLICK 50 MG/ML injection Inject 50 mg into the skin once a week.   Marland Kitchen glucose blood (ONE TOUCH ULTRA TEST) test strip Use test strip to check blood sugar at least 3 times  . halobetasol (ULTRAVATE) 0.05 % cream Apply 1 application topically daily.  . Lancets (ONETOUCH ULTRASOFT) lancets Use lancet to test blood sugar at least twice a day or as directed by your physician.  . loratadine (CLARITIN REDITABS) 10 MG dissolvable tablet Take 10 mg by mouth as needed for allergies.   Marland Kitchen nebivolol (BYSTOLIC) 2.5 MG tablet Take 1 tablet (2.5 mg total) by mouth daily.  Marland Kitchen olmesartan (BENICAR) 20 MG tablet Take 1 tablet (20 mg total) by mouth 2 (two) times daily.  Marland Kitchen omeprazole (PRILOSEC) 20 MG capsule Take 20 mg by mouth daily.   Marland Kitchen oxymetazoline (AFRIN 12 HOUR) 0.05 % nasal spray Place 1 spray into the nose 3 (three) times daily as needed for congestion.   . Pediatric Multiple Vit-C-FA (MULTIVITAMIN ANIMAL SHAPES, WITH CA/FA,) with C & FA chewable tablet Chew 1 tablet by mouth at bedtime.   . Pitavastatin Calcium 1 MG TABS Take 1 mg by mouth 2 (two) times a week.   . rivaroxaban (XARELTO) 20 MG TABS tablet Take 1 tablet (20 mg total) by mouth daily with supper. OK to restart this medication on 03/15/2018.     Allergies:    Amlodipine; Clarithromycin; Codeine; Erythromycin; Statins; and Tetracycline   Social History   Tobacco Use  . Smoking status: Former Smoker    Packs/day: 1.00    Years: 10.00    Pack years: 10.00    Last attempt to quit: 02/14/1974    Years since quitting: 44.3  . Smokeless tobacco: Never Used  Substance Use Topics  . Alcohol use: No  . Drug use: No     Family Hx: The patient's family history includes Arthritis in an other family member; Asthma in her mother and another family member; Breast cancer in an other family member; Breast cancer (age of onset: 101) in her maternal aunt; Heart disease in her mother and another family member; Hypertension in an other family member; Lung cancer in her father; Parkinson's disease in  her maternal grandmother; Rheumatic fever in her maternal grandfather.  ROS:   Please see the history of present illness.    All other systems reviewed and are negative.   Prior CV studies:   The following studies were reviewed today:  Labs/Other Tests and Data Reviewed:    EKG:  No ECG reviewed.  Recent Labs: 12/23/2017: B Natriuretic Peptide 40.2 04/30/2018: ALT 19; BUN 18; Creatinine, Ser 0.99; Hemoglobin 13.4; Platelets 305.0; Potassium 4.4; Sodium 139   Recent Lipid Panel Lab Results  Component Value Date/Time   CHOL 262 (H) 04/30/2018 09:08 AM   TRIG 227.0 (H) 04/30/2018 09:08 AM   HDL 49.20 04/30/2018 09:08 AM   CHOLHDL 5 04/30/2018 09:08 AM   LDLCALC 104 (H) 10/31/2016 09:33 AM   LDLDIRECT 165.0 04/30/2018 09:08 AM    Wt Readings from Last 3 Encounters:  06/08/18 143 lb (64.9 kg)  04/30/18 141 lb 12.8 oz (64.3 kg)  03/20/18 139 lb (63 kg)     Objective:    Vital Signs:  BP 123/74   Pulse 61   Temp 97.8 F (36.6 C)   Ht _0  (1.549 m)   Wt 143 lb (64.9 kg)   SpO2 98%   BMI 27.02 kg/m    VITAL SIGNS:  reviewed  ASSESSMENT & PLAN:     1. Pulmonary embolism -on Xarelto, we will continue, this might be lifelong given that it was  unprovoked, also there is unclear underlying lung malignancy. We will repeat chest CTA in the next follow-up unless she has CT scheduled for follow-up for her pulmonary masses.  2.  Left lower lobe lung mass and other smaller masses, so far considered to be benign so far on PET and bronchoscopy, she was supposed to undergo another surgcal biopsy, however postponed sec to Covid pandemic.  3.  Mild nonobstructive CAD -and aortic atherosclerosis as well as microvascular disease on her brain imaging, I would support continuation of statin she is going to retry and if this fails we will refer to our pharmacy for PCSK9 inhibitors.  4.  Hypertension -controlled  5. Hyperlipidemia - increase pivastatin to 3x/week, add zetia, repeat lipids prior to the next visit in 3 months, she is very nervous to be on two different biological drugs in the settings of pandemia. We will recheck her lipids prior to the next visit and discuss with our lipid clinic.  4.  Lower extremity erythema, unilateral, very mild, she is on xarelto already - low chance of DVT, no pain, she is instructed to elevate and use compression stockings  COVID-19 Education: The signs and symptoms of COVID-19 were discussed with the patient and how to seek care for testing (follow up with PCP or arrange E-visit).  The importance of social distancing was discussed today.  Time:   Today, I have spent 35 minutes with the patient with telehealth technology discussing the above problems.     Medication Adjustments/Labs and Tests Ordered: Current medicines are reviewed at length with the patient today.  Concerns regarding medicines are outlined above.   Tests Ordered: No orders of the defined types were placed in this encounter.   Medication Changes: No orders of the defined types were placed in this encounter.   Disposition:  Follow up in 3 month(s)  Signed, Ena Dawley, MD  06/08/2018 8:06 AM    [place]

## 2018-06-09 MED FILL — HALOBETASOL PROP 0.05% CRM: 0.05 | 30 days supply | Qty: 50 | Fill #0

## 2018-07-27 ENCOUNTER — Emergency Department (HOSPITAL_COMMUNITY): Payer: Medicare Other

## 2018-07-27 ENCOUNTER — Encounter (HOSPITAL_COMMUNITY): Payer: Self-pay | Admitting: Emergency Medicine

## 2018-07-27 ENCOUNTER — Ambulatory Visit: Payer: Self-pay | Admitting: Internal Medicine

## 2018-07-27 ENCOUNTER — Other Ambulatory Visit: Payer: Self-pay

## 2018-07-27 ENCOUNTER — Emergency Department (HOSPITAL_COMMUNITY)
Admission: EM | Admit: 2018-07-27 | Discharge: 2018-07-27 | Disposition: A | Discharge status: Home / Self Care | Payer: Medicare Other | Attending: Emergency Medicine | Admitting: Emergency Medicine

## 2018-07-27 DIAGNOSIS — R197 Diarrhea, unspecified: Secondary | ICD-10-CM

## 2018-07-27 DIAGNOSIS — R1084 Generalized abdominal pain: Secondary | ICD-10-CM | POA: Insufficient documentation

## 2018-07-27 DIAGNOSIS — Z79899 Other long term (current) drug therapy: Secondary | ICD-10-CM | POA: Diagnosis not present

## 2018-07-27 DIAGNOSIS — E119 Type 2 diabetes mellitus without complications: Secondary | ICD-10-CM | POA: Diagnosis not present

## 2018-07-27 DIAGNOSIS — I10 Essential (primary) hypertension: Secondary | ICD-10-CM | POA: Diagnosis not present

## 2018-07-27 DIAGNOSIS — R112 Nausea with vomiting, unspecified: Secondary | ICD-10-CM | POA: Diagnosis not present

## 2018-07-27 LAB — COMPREHENSIVE METABOLIC PANEL
ALT: 27 U/L (ref 0–44)
AST: 23 U/L (ref 15–41)
Albumin: 4 g/dL (ref 3.5–5.0)
Alkaline Phosphatase: 61 U/L (ref 38–126)
Anion gap: 10 (ref 5–15)
BUN: 9 mg/dL (ref 8–23)
CO2: 24 mmol/L (ref 22–32)
Calcium: 9.3 mg/dL (ref 8.9–10.3)
Chloride: 107 mmol/L (ref 98–111)
Creatinine, Ser: 1 mg/dL (ref 0.44–1.00)
GFR calc Af Amer: >60 mL/min (ref >60)
GFR calc non Af Amer: 55 mL/min — ABNORMAL LOW (ref >60)
Glucose, Bld: 121 mg/dL — ABNORMAL HIGH (ref 70–99)
Potassium: 4.2 mmol/L (ref 3.5–5.1)
Sodium: 141 mmol/L (ref 135–145)
Total Bilirubin: 1.1 mg/dL (ref 0.3–1.2)
Total Protein: 7.3 g/dL (ref 6.5–8.1)

## 2018-07-27 LAB — URINALYSIS, ROUTINE W REFLEX MICROSCOPIC
Bacteria, UA: NONE SEEN
Bilirubin Urine: NEGATIVE
Glucose, UA: NEGATIVE mg/dL
Ketones, ur: NEGATIVE mg/dL
Leukocytes,Ua: NEGATIVE
Nitrite: NEGATIVE
Protein, ur: NEGATIVE mg/dL
Specific Gravity, Urine: 1.02 (ref 1.005–1.030)
pH: 6 (ref 5.0–8.0)

## 2018-07-27 LAB — CBC
HCT: 39 % (ref 36.0–46.0)
Hemoglobin: 12.7 g/dL (ref 12.0–15.0)
MCH: 31.4 pg (ref 26.0–34.0)
MCHC: 32.6 g/dL (ref 30.0–36.0)
MCV: 96.5 fL (ref 80.0–100.0)
Platelets: 287 10*3/uL (ref 150–400)
RBC: 4.04 MIL/uL (ref 3.87–5.11)
RDW: 13.4 % (ref 11.5–15.5)
WBC: 8 10*3/uL (ref 4.0–10.5)
nRBC: 0 % (ref 0.0–0.2)

## 2018-07-27 LAB — LIPASE, BLOOD: Lipase: 27 U/L (ref 11–51)

## 2018-07-27 MED ORDER — SODIUM CHLORIDE 0.9% FLUSH
3.0000 mL | Freq: Once | INTRAVENOUS | Status: AC
  Administered 2018-07-27: 3 mL via INTRAVENOUS

## 2018-07-27 MED ORDER — IOHEXOL 300 MG/ML  SOLN
100.0000 mL | Freq: Once | INTRAMUSCULAR | Status: AC | PRN
  Administered 2018-07-27: 100 mL via INTRAVENOUS

## 2018-07-27 MED ORDER — SODIUM CHLORIDE 0.9 % IV SOLN
INTRAVENOUS | Status: DC
  Administered 2018-07-27: 11:00:00 via INTRAVENOUS

## 2018-07-27 MED ORDER — METOCLOPRAMIDE HCL 10 MG PO TABS: 10.0000 mg | ORAL_TABLET | Freq: Four times a day (QID) | ORAL | 0 refills | Status: DC

## 2018-07-27 NOTE — Telephone Encounter (Signed)
Patient has been advised to go to ED.

## 2018-07-27 NOTE — Discharge Instructions (Addendum)
Your testing today has not shown anything specific other than the presence of diarrhea.  This is reassuring however you will need to take lots of fluids over the next couple of days, you may use Reglan every 6 hours as needed for nausea.  If this makes you feel stiff or agitated take 25 mg of Benadryl.  Return to the emergency department for increasing abdominal pain vomiting or any blood in the stools.  Take Imodium, you may take 2 tablets immediately and then 1 tablet with every bowel movement with a maximum of 8/day.  You may get this medication at your pharmacy over-the-counter.

## 2018-07-27 NOTE — ED Provider Notes (Signed)
[facility] EMERGENCY DEPARTMENT Provider Note   CSN: [id number] Arrival date & time: 07/27/18  4098    History   Chief Complaint Chief Complaint  Patient presents with   Emesis   Nausea   Gastroesophageal Reflux   Diarrhea    HPI [name] is a 73 y.o. female.     HPI  73 year old female, she has a known history of hyperlipidemia, hypertension, chronic rectal fissure and anemia.  She states that over the last week she has had some nausea, intermittent vomiting but tolerated this well.  Overnight she developed diffuse watery diarrhea with abdominal mild cramping, lots of gas.  The report was that the patient had multiple episodes of diarrhea this morning and states that is now turned extremely watery.  There is no fevers or chills, no coughing or shortness of breath and no chest pain.  She has minimal left lower quadrant discomfort which she states she thinks may be related to her sciatic nerve.  She was told by her family doctor to come to the hospital today instead of going into the office.  The patient denies recent travel The patient denies recent antibiotics The patient denies any sick contacts and lives by herself She has last been out of the house on Thursday when she went to the grocery store She denies any food exposures that may be food poisoning. She has been taking ondansetron which helps with the nausea but gives her headache. She did take Pepto-Bismol  Past Medical History:  Diagnosis Date   ALLERGIC RHINITIS    ANEMIA-NOS    Arthritis    ASTHMA    Carpal tunnel syndrome    COLONIC POLYPS, HX OF    Coronary artery disease    "mild CAD" noted on 12/05/17 in coronary CT scan   DIABETES MELLITUS, TYPE II    diet controlled   GERD    HYPERLIPIDEMIA    HYPERTENSION    OSTEOPENIA    PONV (postoperative nausea and vomiting)    Psoriasis    severe, began soriatane 01/2012   Rectal fissure    Scoliosis      Patient Active Problem List   Diagnosis Date Noted   Pneumothorax 03/14/2018   S/P bronchoscopy with biopsy    Lung abnormality 01/11/2018   Pulmonary embolism ([place]) 12/24/2017   Lung mass 12/24/2017   Small vessel disease, cerebrovascular 07/31/2017   Atypical chest pain 06/14/2017   Dizziness 06/14/2017   Easy bruising 05/30/2017   Hyperuricemia 05/03/2017   Arthralgia 05/01/2017   Hair loss 05/01/2017   Vitamin D deficiency 10/31/2016   Redness of skin, feet 11/91/4782   Diastolic dysfunction 95/62/1308   Vertigo 12/24/2015   Fatigue 12/24/2015   Nonspecific abnormal electrocardiogram (ECG) (EKG) 12/24/2015   Bilateral carotid artery disease, Mild 05/31/2015   Thyroid nodule 05/31/2015   Psoriasis    Scoliosis    Diabetes type 2, controlled ([place]) 06/03/2008   Dyslipidemia 06/03/2008   CARPAL TUNNEL SYNDROME 06/03/2008   HTN (hypertension) 06/03/2008   ALLERGIC RHINITIS 06/03/2008   Asthma 06/03/2008   GERD (gastroesophageal reflux disease) 06/03/2008   Osteopenia 06/03/2008   COLONIC POLYPS, HX OF 06/03/2008    Past Surgical History:  Procedure Laterality Date   benign rectal growth  2004   removed by Dr. Zella Richer   BREAST BIOPSY Right    BREAST EXCISIONAL BIOPSY Left    BREAST SURGERY  1988   biopsy   [place]  BRONCHOSCOPY WITH ENDOBRONCHIAL NAVIGATION N/A 03/14/2018   Procedure: VIDEO BRONCHOSCOPY WITH ENDOBRONCHIAL NAVIGATION;  Surgeon: Collene Gobble, MD;  Location: [facility] OR;  Service: Thoracic;  Laterality: N/A;     OB History   No obstetric history on file.      Home Medications    Prior to Admission medications   Medication Sig Start Date End Date Taking? Authorizing Provider  blood glucose meter kit and supplies KIT Dispense based on patient and insurance preference. ONE TOUCH ULTRA 05/01/17   [name], MD  Cholecalciferol (VITAMIN D3) 2000 units TABS Take 2,000 Units by mouth at  bedtime.     [provider]  clindamycin (CLEOCIN T) 1 % external solution Apply 1 application topically daily as needed (cuts). 01/10/18   [name], MD  clobetasol (TEMOVATE) 0.05 % external solution Apply 1 application topically daily as needed (psoriasis).  04/17/17 04/17/18  [provider]  Coenzyme Q10 (COQ10) 200 MG CAPS Take 200 mg by mouth at bedtime. Gummies    [provider]  diazepam (VALIUM) 2 MG tablet Take 1 tablet (2 mg total) by mouth every 6 (six) hours as needed (dizziness). 04/30/18   [name], MD  diphenhydrAMINE HCl, Sleep, 25 MG TBDP Take 12 mg by mouth at bedtime.    [provider]  ENBREL SURECLICK 50 MG/ML injection Inject 50 mg into the skin once a week.  12/05/12   [provider]  ezetimibe (ZETIA) 10 MG tablet Take 1 tablet (10 mg total) by mouth daily. 06/08/18   Dorothy Spark, MD  glucose blood (ONE TOUCH ULTRA TEST) test strip Use test strip to check blood sugar at least 3 times 05/01/17   [name], MD  halobetasol (ULTRAVATE) 0.05 % cream Apply 1 application topically daily. 10/24/16   [provider]  Lancets (ONETOUCH ULTRASOFT) lancets Use lancet to test blood sugar at least twice a day or as directed by your physician. 05/01/17   [name], MD  loratadine (CLARITIN REDITABS) 10 MG dissolvable tablet Take 10 mg by mouth as needed for allergies.     [provider]  metoCLOPramide (REGLAN) 10 MG tablet Take 1 tablet (10 mg total) by mouth every 6 (six) hours. 07/27/18   Noemi Chapel, MD  nebivolol (BYSTOLIC) 2.5 MG tablet Take 1 tablet (2.5 mg total) by mouth daily. 04/30/18   [name], MD  olmesartan (BENICAR) 20 MG tablet Take 1 tablet (20 mg total) by mouth 2 (two) times daily. 05/01/18   [name], MD  omeprazole (PRILOSEC) 20 MG capsule Take 20 mg by mouth daily.     [provider]  oxymetazoline (AFRIN 12 HOUR) 0.05 % nasal spray Place 1 spray into the nose  3 (three) times daily as needed for congestion.     [provider]  Pediatric Multiple Vit-C-FA (MULTIVITAMIN ANIMAL SHAPES, WITH CA/FA,) with C & FA chewable tablet Chew 1 tablet by mouth at bedtime.     [provider]  Pitavastatin Calcium 1 MG TABS Take 1 tablet (1 mg total) by mouth 3 (three) times a week. 06/08/18   Dorothy Spark, MD  rivaroxaban (XARELTO) 20 MG TABS tablet Take 1 tablet (20 mg total) by mouth daily with supper. OK to restart this medication on 03/15/2018. 03/16/18   Donita Brooks, NP    Family History Family History  Problem Relation Age of Onset   Lung cancer Father    Arthritis  Other        Parents   Asthma Other        parent, other relative   Breast cancer Other        other relative   Hypertension Other        parent, other relative   Heart disease Other        parent, other relative   Heart disease Mother    Asthma Mother    Breast cancer Maternal Aunt 68   Parkinson's disease Maternal Grandmother    Rheumatic fever Maternal Grandfather     Social History Social History   Tobacco Use   Smoking status: Former Smoker    Packs/day: 1.00    Years: 10.00    Pack years: 10.00    Quit date: 02/14/1974    Years since quitting: 44.4   Smokeless tobacco: Never Used  Substance Use Topics   Alcohol use: No   Drug use: No     Allergies   Amlodipine, Clarithromycin, Codeine, Erythromycin, Statins, and Tetracycline   Review of Systems Review of Systems  All other systems reviewed and are negative.    Physical Exam Updated Vital Signs BP 132/73 (BP Location: Left Arm)    Pulse 71    Temp 98.5 F (36.9 C) (Oral)    Resp 14    Ht 1.549 m (5' 1")    SpO2 97%    BMI 27.02 kg/m   Physical Exam Vitals signs and nursing note reviewed.  Constitutional:      General: She is not in acute distress.    Appearance: She is well-developed.  HENT:     Head: Normocephalic and atraumatic.     Mouth/Throat:      Pharynx: No oropharyngeal exudate.  Eyes:     General: No scleral icterus.       Right eye: No discharge.        Left eye: No discharge.     Conjunctiva/sclera: Conjunctivae normal.     Pupils: Pupils are equal, round, and reactive to light.  Neck:     Musculoskeletal: Normal range of motion and neck supple.     Thyroid: No thyromegaly.     Vascular: No JVD.  Cardiovascular:     Rate and Rhythm: Normal rate and regular rhythm.     Heart sounds: Normal heart sounds. No murmur. No friction rub. No gallop.   Pulmonary:     Effort: Pulmonary effort is normal. No respiratory distress.     Breath sounds: Normal breath sounds. No wheezing or rales.  Abdominal:     General: There is no distension.     Palpations: Abdomen is soft. There is no mass.     Tenderness: There is no abdominal tenderness.     Comments: The abdomen is very soft, there is no focal tenderness except for the very small amount on the left lower quadrant.  There is no guarding or peritoneal signs, no tenderness in the right upper quadrant or the right lower quadrant, the bowel sounds are very increased  Musculoskeletal: Normal range of motion.        General: No tenderness.  Lymphadenopathy:     Cervical: No cervical adenopathy.  Skin:    General: Skin is warm and dry.     Findings: No erythema or rash.  Neurological:     Mental Status: She is alert.     Coordination: Coordination normal.  Psychiatric:        Behavior: Behavior normal.  ED Treatments / Results  Labs (all labs ordered are listed, but only abnormal results are displayed) Labs Reviewed  COMPREHENSIVE METABOLIC PANEL - Abnormal; Notable for the following components:      Result Value   Glucose, Bld 121 (*)    GFR calc non Af Amer 55 (*)    All other components within normal limits  URINALYSIS, ROUTINE W REFLEX MICROSCOPIC - Abnormal; Notable for the following components:   Color, Urine STRAW (*)    Hgb urine dipstick MODERATE (*)    All  other components within normal limits  LIPASE, BLOOD  CBC    EKG None  Radiology Ct Abdomen Pelvis W Contrast  Result Date: 07/27/2018 CLINICAL DATA:  Diarrhea.  Nausea vomiting for a week. EXAM: CT ABDOMEN AND PELVIS WITH CONTRAST TECHNIQUE: Multidetector CT imaging of the abdomen and pelvis was performed using the standard protocol following bolus administration of intravenous contrast. CONTRAST:  146m OMNIPAQUE IOHEXOL 300 MG/ML  SOLN COMPARISON:  12/22/2017 chest CT. 01/01/2018 PET. Abdominopelvic CT of 04/12/2007 FINDINGS: Lower chest: Left lower lobe somewhat irregular density is similar in size to 01/01/2018 PET, favored to represent a mucocele. There are also areas of right middle lobe nodularity which are unchanged and favored to represent mucoceles. New or increased right lower lobe 1.2 cm nodular density including on image 10/4. Normal heart size without pericardial or pleural effusion. Tiny hiatal hernia. Hepatobiliary: Mild hepatic steatosis, without focal liver lesion. Normal gallbladder, without biliary ductal dilatation. Pancreas: Pancreatic fatty replacement throughout, without duct dilatation or dominant mass. Spleen: Normal in size, without focal abnormality. Adrenals/Urinary Tract: Normal adrenal glands. Normal left kidney. Inter/lower pole right renal lesion measures 2.9 cm. Has mild interstitial thickening surrounding and heterogeneity within, including image 34/3. At the site of a simple 7.2 cm cyst back in 2009. Normal urinary bladder. Stomach/Bowel: The proximal stomach is underdistended but appears thick walled, including at 3.1 cm on 21/3. Fluid-filled colon. Scattered colonic diverticula. Normal terminal ileum. Normal small bowel. Vascular/Lymphatic: Aortic and branch vessel atherosclerosis. No abdominopelvic adenopathy. Reproductive: Central uterine hypoattenuation, including at 1.3 cm on sagittal image 92. No adnexal mass. Other: Moderate pelvic floor laxity.  No significant  free fluid. Musculoskeletal: Convex left lumbar spine curvature. Lumbosacral spondylosis. IMPRESSION: 1.  No acute process in the abdomen or pelvis. 2. Fluid-filled colon, consistent with a diarrheal state. 3. Primarily similar bilateral pulmonary nodular densities, favored to represent mucoceles. A right lower lobe density is new or increased. As per the prior PET, chest CT follow-up is recommended. 4. Hepatic steatosis. 5. Gastric underdistention. Cannot exclude concurrent gastritis. Appearance of wall thickening has been present over multiple prior exams. 6.  Aortic Atherosclerosis (ICD10-I70.0). 7. Right renal lesion which demonstrates complexity. Favored to represent an involuting cyst, possibly complicated by prior hemorrhage or infection. Recommend attention on follow-up. 8. Central uterine hypoattenuation, abnormal for age. Correlate with postmenopausal bleeding and consider nonemergent pelvic ultrasound. Electronically Signed   By: KAbigail MiyamotoM.D.   On: 07/27/2018 12:54    Procedures Procedures (including critical care time)  Medications Ordered in ED Medications  0.9 %  sodium chloride infusion ( Intravenous New Bag/Given 07/27/18 1120)  sodium chloride flush (NS) 0.9 % injection 3 mL (3 mLs Intravenous Given 07/27/18 1121)  iohexol (OMNIPAQUE) 300 MG/ML solution 100 mL (100 mLs Intravenous Contrast Given 07/27/18 1201)     Initial Impression / Assessment and Plan / ED Course  I have reviewed the triage vital signs and the nursing notes.  Pertinent  labs & imaging results that were available during my care of the patient were reviewed by me and considered in my medical decision making (see chart for details).        The patient does not appear to be in distress, she may very well be starting to get dehydrated but does not have a surgical abdomen.  Her labs are unremarkable with no leukocytosis, normal lipase and metabolic panel with renal function.  We will add a CT scan to make sure  this is not an infectious colitis or other complication.  The patient is agreeable.  She does report having a small amount of blood in the stool but also states this is related to a rectal fissure which is a chronic problem.  CT reassuring as are the lab tests  I informed the patient about all of the results She is agreeable to f/u and share results with her PCP She is aware of lung findings from prior w/u with Bronch / Pulm and PET scan.  Final Clinical Impressions(s) / ED Diagnoses   Final diagnoses:  Nausea vomiting and diarrhea    ED Discharge Orders         Ordered    metoCLOPramide (REGLAN) 10 MG tablet  Every 6 hours     07/27/18 1445           Noemi Chapel, MD 07/27/18 1448

## 2018-07-27 NOTE — Telephone Encounter (Signed)
Pt calling in c/o diarrhea and everything going straight thru her.  Still very nauseas and having watery diarrhea.   Only eating toast and Pedialyte but it's going straight thru.  I have referred her to the ED.   She is going to [facility].  I have sent these notes to Dr. Quay Burow so she would be aware of the ED referral.    Reason for Disposition . Patient sounds very sick or weak to the triager  Answer Assessment - Initial Assessment Questions 1. DIARRHEA SEVERITY: "How bad is the diarrhea?" "How many extra stools have you had in the past 24 hours than normal?"    - NO DIARRHEA (SCALE 0)   - MILD (SCALE 1-3): Few loose or mushy BMs; increase of 1-3 stools over normal daily number of stools; mild increase in ostomy output.   -  MODERATE (SCALE 4-7): Increase of 4-6 stools daily over normal; moderate increase in ostomy output. * SEVERE (SCALE 8-10; OR 'WORST POSSIBLE'): Increase of 7 or more stools daily over normal; moderate increase in ostomy output; incontinence.     A week ago I started having nausea and acid reflux.   The Prilosec didn't help.  Yesterday morning the diarrhea started it's explosive.   Everything is going straight thru me.   My rectum is bleeding.   I'm drinking Pedialyte but I'm not getting better. 2. ONSET: "When did the diarrhea begin?"      Yesterday morning 3. BM CONSISTENCY: "How loose or watery is the diarrhea?"      Explosive the first time and now it's pure liquid.  The Pepto-Bismal and it did not help.  The dark did not start until after taking the Pepto-Bismal.   I'm on blood thinner.  Diabetes and heart problems.   I have a bitter acidic taste in my mouth. 4. VOMITING: "Are you also vomiting?" If so, ask: "How many times in the past 24 hours?"      Vomited once 2 days ago.   I'm nauseated all the time now. 5. ABDOMINAL PAIN: "Are you having any abdominal pain?" If yes: "What does it feel like?" (e.g., crampy, dull, intermittent, constant)      A little  cramping.   A lot of gas and rumbling 6. ABDOMINAL PAIN SEVERITY: If present, ask: "How bad is the pain?"  (e.g., Scale 1-10; mild, moderate, or severe)   - MILD (1-3): doesn't interfere with normal activities, abdomen soft and not tender to touch    - MODERATE (4-7): interferes with normal activities or awakens from sleep, tender to touch    - SEVERE (8-10): excruciating pain, doubled over, unable to do any normal activities       3-5 on pain scale.    7. ORAL INTAKE: If vomiting, "Have you been able to drink liquids?" "How much fluids have you had in the past 24 hours?"     Drinking Pedialyte and eating toast and it's all going thru me. 8. HYDRATION: "Any signs of dehydration?" (e.g., dry mouth [not just dry lips], too weak to stand, dizziness, new weight loss) "When did you last urinate?"     I have vertigo but not now.    I'm so tired.  My urine is reduced. 9. EXPOSURE: "Have you traveled to a foreign country recently?" "Have you been exposed to anyone with diarrhea?" "Could you have eaten any food that was spoiled?"     No  Don't even leave the house due to the COVID-19 pandemic.  10. ANTIBIOTIC USE: "Are you taking antibiotics now or have you taken antibiotics in the past 2 months?"       No 11. OTHER SYMPTOMS: "Do you have any other symptoms?" (e.g., fever, blood in stool)       Rectal bleeding from the diarrhea.   No fever or chills. 12. PREGNANCY: "Is there any chance you are pregnant?" "When was your last menstrual period?"       Not asked due to age  Protocols used: DIARRHEA-A-AH

## 2018-07-27 NOTE — ED Triage Notes (Signed)
Pt. Stated, [name] had N/V/D for a week but the severe diarrhea started yesterday just liquid. I called my Dr. And they told me to come here.

## 2018-07-31 ENCOUNTER — Other Ambulatory Visit: Payer: Self-pay

## 2018-07-31 ENCOUNTER — Encounter: Payer: Self-pay | Admitting: Internal Medicine

## 2018-07-31 ENCOUNTER — Ambulatory Visit (INDEPENDENT_AMBULATORY_CARE_PROVIDER_SITE_OTHER): Payer: Medicare Other | Admitting: Internal Medicine

## 2018-07-31 ENCOUNTER — Other Ambulatory Visit (INDEPENDENT_AMBULATORY_CARE_PROVIDER_SITE_OTHER): Payer: Medicare Other

## 2018-07-31 VITALS — BP 150/84 | HR 72 | Temp 98.1°F | Resp 16 | Ht 61.0 in | Wt 142.0 lb

## 2018-07-31 DIAGNOSIS — R197 Diarrhea, unspecified: Secondary | ICD-10-CM | POA: Diagnosis not present

## 2018-07-31 DIAGNOSIS — R935 Abnormal findings on diagnostic imaging of other abdominal regions, including retroperitoneum: Secondary | ICD-10-CM | POA: Diagnosis not present

## 2018-07-31 LAB — CBC WITH DIFFERENTIAL/PLATELET
Basophils Absolute: 0 10*3/uL (ref 0.0–0.1)
Basophils Relative: 0.4 % (ref 0.0–3.0)
Eosinophils Absolute: 0.1 10*3/uL (ref 0.0–0.7)
Eosinophils Relative: 1.2 % (ref 0.0–5.0)
HCT: 39.7 % (ref 36.0–46.0)
Hemoglobin: 12.9 g/dL (ref 12.0–15.0)
Lymphocytes Relative: 23.6 % (ref 12.0–46.0)
Lymphs Abs: 1.4 10*3/uL (ref 0.7–4.0)
MCHC: 32.5 g/dL (ref 30.0–36.0)
MCV: 96.8 fl (ref 78.0–100.0)
Monocytes Absolute: 0.5 10*3/uL (ref 0.1–1.0)
Monocytes Relative: 8.8 % (ref 3.0–12.0)
Neutro Abs: 4 10*3/uL (ref 1.4–7.7)
Neutrophils Relative %: 66 % (ref 43.0–77.0)
Platelets: 276 10*3/uL (ref 150.0–400.0)
RBC: 4.1 Mil/uL (ref 3.87–5.11)
RDW: 14 % (ref 11.5–15.5)
WBC: 6 10*3/uL (ref 4.0–10.5)

## 2018-07-31 LAB — COMPREHENSIVE METABOLIC PANEL
ALT: 25 U/L (ref 0–35)
AST: 21 U/L (ref 0–37)
Albumin: 3.9 g/dL (ref 3.5–5.2)
Alkaline Phosphatase: 56 U/L (ref 39–117)
BUN: 9 mg/dL (ref 6–23)
CO2: 23 mEq/L (ref 19–32)
Calcium: 8.5 mg/dL (ref 8.4–10.5)
Chloride: 108 mEq/L (ref 96–112)
Creatinine, Ser: 1.01 mg/dL (ref 0.40–1.20)
GFR: 53.47 mL/min — ABNORMAL LOW (ref >60.00)
Glucose, Bld: 95 mg/dL (ref 70–99)
Potassium: 3.4 mEq/L — ABNORMAL LOW (ref 3.5–5.1)
Sodium: 140 mEq/L (ref 135–145)
Total Bilirubin: 1 mg/dL (ref 0.2–1.2)
Total Protein: 6.6 g/dL (ref 6.0–8.3)

## 2018-07-31 MED ORDER — DIPHENOXYLATE-ATROPINE 2.5-0.025 MG PO TABS: 2.0000 | ORAL_TABLET | Freq: Four times a day (QID) | ORAL | 1 refills | Status: DC | PRN

## 2018-07-31 NOTE — Patient Instructions (Signed)
  Tests ordered today. Your results will be released to Tobias (or called to you) after review, usually within 72hours after test completion. If any changes need to be made, you will be notified at that same time.   Medications reviewed and updated.  Changes include :   Lomotil as needed for diarrhea.  Continue zofran   Your prescription(s) have been submitted to your pharmacy. Please take as directed and contact our office if you believe you are having problem(s) with the medication(s).  Call Dr Collene Mares for an appointment.    A pelvic ultrasound was ordered.

## 2018-07-31 NOTE — Assessment & Plan Note (Signed)
T scan done in the emergency room showed central uterine hypoattenuation, abnormal for age.  Recommended correlation with nonemergent pelvic ultrasound Pelvic/transvaginal ultrasound ordered since she does not follow with a gynecologist

## 2018-07-31 NOTE — Progress Notes (Signed)
  Subjective:    Patient ID: [name], female    DOB: 07/04/1945, 72 y.o.   MRN: [id number]  HPI The patient is here for follow up from the ED.  ED on 07/27/18 for nausea, vomiting, GERD and diarrhea.  Blood work at that time was unremarkable.  A CT scan showed no acute cause for the diarrhea.  There is no evidence of colitis.   Her nausea started about two weeks ago.  She has had some vomiting.  She has taken some zofran.  She had increased GERD initially and a bitter taste in her mouth.  She had a lot of gurgling and gas.  She then started having diarrhea last Thursday 5 days ago.  She went to the ED the next day.   She took pepto bismol initially and then was prescribed imodium by th ED.  She has been taking zofran and imodium since the ED.  She is taking 4 imodium a day.  Today she has had liquid diarrhea 4-5 times today already. She is eating some, but not much.  Her nausea is better, but she did take a zofran this morning.    She initially had some mucus in her stool, but that has resolved.  Her diarrhea and vomit is non-bloody.  She has a anal fissure and hemorrhoids and had bleeding and that has gotten better.   She denies travel, sick contacts, new foods.  She has been very compliant with COVID-19 restrictions.   Her last antibiotic was levaquin 01/2018.     She denies new medications.  She stopped her zetia one week ago.  She does not take any nsaids.     Medications and allergies reviewed with patient and updated if appropriate.  Patient Active Problem List   Diagnosis Date Noted  . Pneumothorax 03/14/2018  . S/P bronchoscopy with biopsy   . Lung abnormality 01/11/2018  . Pulmonary embolism (HCC) 12/24/2017  . Lung mass 12/24/2017  . Small vessel disease, cerebrovascular 07/31/2017  . Atypical chest pain 06/14/2017  . Dizziness 06/14/2017  . Easy bruising 05/30/2017  . Hyperuricemia 05/03/2017  . Arthralgia 05/01/2017  . Hair loss 05/01/2017  . Vitamin D  deficiency 10/31/2016  . Redness of skin, feet 10/31/2016  . Diastolic dysfunction 02/03/2016  . Vertigo 12/24/2015  . Fatigue 12/24/2015  . Nonspecific abnormal electrocardiogram (ECG) (EKG) 12/24/2015  . Bilateral carotid artery disease, Mild 05/31/2015  . Thyroid nodule 05/31/2015  . Psoriasis   . Scoliosis   . Diabetes type 2, controlled (HCC) 06/03/2008  . Dyslipidemia 06/03/2008  . CARPAL TUNNEL SYNDROME 06/03/2008  . HTN (hypertension) 06/03/2008  . ALLERGIC RHINITIS 06/03/2008  . Asthma 06/03/2008  . GERD (gastroesophageal reflux disease) 06/03/2008  . Osteopenia 06/03/2008  . COLONIC POLYPS, HX OF 06/03/2008    Current Outpatient Medications on File Prior to Visit  Medication Sig Dispense Refill  . blood glucose meter kit and supplies KIT Dispense based on patient and insurance preference. ONE TOUCH ULTRA 1 each 0  . Cholecalciferol (VITAMIN D3) 2000 units TABS Take 2,000 Units by mouth at bedtime.     . clindamycin (CLEOCIN T) 1 % external solution Apply 1 application topically daily as needed (cuts). 30 mL 1  . Coenzyme Q10 (COQ10) 200 MG CAPS Take 200 mg by mouth at bedtime. Gummies    . diazepam (VALIUM) 2 MG tablet Take 1 tablet (2 mg total) by mouth every 6 (six) hours as needed (dizziness). 20 tablet 0  . diphenhydrAMINE   HCl, Sleep, 25 MG TBDP Take 12 mg by mouth at bedtime.    . ENBREL SURECLICK 50 MG/ML injection Inject 50 mg into the skin once a week.     . ezetimibe (ZETIA) 10 MG tablet Take 1 tablet (10 mg total) by mouth daily. 90 tablet 2  . glucose blood (ONE TOUCH ULTRA TEST) test strip Use test strip to check blood sugar at least 3 times 300 each 1  . halobetasol (ULTRAVATE) 0.05 % cream Apply 1 application topically daily.    . Lancets (ONETOUCH ULTRASOFT) lancets Use lancet to test blood sugar at least twice a day or as directed by your physician. 200 each 11  . loratadine (CLARITIN REDITABS) 10 MG dissolvable tablet Take 10 mg by mouth as needed for  allergies.     . nebivolol (BYSTOLIC) 2.5 MG tablet Take 1 tablet (2.5 mg total) by mouth daily. 90 tablet 1  . olmesartan (BENICAR) 20 MG tablet Take 1 tablet (20 mg total) by mouth 2 (two) times daily. 180 tablet 1  . omeprazole (PRILOSEC) 20 MG capsule Take 20 mg by mouth daily.     . ondansetron (ZOFRAN) 4 MG tablet Take 4 mg by mouth every 8 (eight) hours as needed for nausea or vomiting.    . oxymetazoline (AFRIN 12 HOUR) 0.05 % nasal spray Place 1 spray into the nose 3 (three) times daily as needed for congestion.     . Pediatric Multiple Vit-C-FA (MULTIVITAMIN ANIMAL SHAPES, WITH CA/FA,) with C & FA chewable tablet Chew 1 tablet by mouth at bedtime.     . Pitavastatin Calcium 1 MG TABS Take 1 tablet (1 mg total) by mouth 3 (three) times a week. 45 tablet 2  . rivaroxaban (XARELTO) 20 MG TABS tablet Take 1 tablet (20 mg total) by mouth daily with supper. OK to restart this medication on 03/15/2018. 30 tablet 11  . clobetasol (TEMOVATE) 0.05 % external solution Apply 1 application topically daily as needed (psoriasis).      No current facility-administered medications on file prior to visit.     Past Medical History:  Diagnosis Date  . ALLERGIC RHINITIS   . ANEMIA-NOS   . Arthritis   . ASTHMA   . Carpal tunnel syndrome   . COLONIC POLYPS, HX OF   . Coronary artery disease    "mild CAD" noted on 12/05/17 in coronary CT scan  . DIABETES MELLITUS, TYPE II    diet controlled  . GERD   . HYPERLIPIDEMIA   . HYPERTENSION   . OSTEOPENIA   . PONV (postoperative nausea and vomiting)   . Psoriasis    severe, began soriatane 01/2012  . Rectal fissure   . Scoliosis     Past Surgical History:  Procedure Laterality Date  . benign rectal growth  2004   removed by Dr. Rosenbower  . BREAST BIOPSY Right   . BREAST EXCISIONAL BIOPSY Left   . BREAST SURGERY  1988   biopsy  . CESAREAN SECTION    . VIDEO BRONCHOSCOPY WITH ENDOBRONCHIAL NAVIGATION N/A 03/14/2018   Procedure: VIDEO  BRONCHOSCOPY WITH ENDOBRONCHIAL NAVIGATION;  Surgeon: Byrum, Robert S, MD;  Location: [facility] OR;  Service: Thoracic;  Laterality: N/A;    Social History   Socioeconomic History  . Marital status: Single    Spouse name: Not on file  . Number of children: 1  . Years of education: 16  . Highest education level: Bachelor's degree (e.g., BA, AB, BS)  Occupational History  .   Occupation: retired Careers adviser  Social Needs  . Financial resource strain: Not on file  . Food insecurity    Worry: Not on file    Inability: Not on file  . Transportation needs    Medical: Not on file    Non-medical: Not on file  Tobacco Use  . Smoking status: Former Smoker    Packs/day: 1.00    Years: 10.00    Pack years: 10.00    Quit date: 02/14/1974    Years since quitting: 44.4  . Smokeless tobacco: Never Used  Substance and Sexual Activity  . Alcohol use: No  . Drug use: No  . Sexual activity: Not on file  Lifestyle  . Physical activity    Days per week: Not on file    Minutes per session: Not on file  . Stress: Not on file  Relationships  . Social Herbalist on phone: Not on file    Gets together: Not on file    Attends religious service: Not on file    Active member of club or organization: Not on file    Attends meetings of clubs or organizations: Not on file    Relationship status: Not on file  Other Topics Concern  . Not on file  Social History Narrative   Lives alone in a one story home.  Has one child and one grandchild.  Retired Careers adviser.  Education: college.     Family History  Problem Relation Age of Onset  . Lung cancer Father   . Arthritis Other        Parents  . Asthma Other        parent, other relative  . Breast cancer Other        other relative  . Hypertension Other        parent, other relative  . Heart disease Other        parent, other relative  . Heart disease Mother   . Asthma Mother    . Breast cancer Maternal Aunt 68  . Parkinson's disease Maternal Grandmother   . Rheumatic fever Maternal Grandfather     Review of Systems  Constitutional: Negative for chills and fever.  Respiratory: Negative for cough, shortness of breath and wheezing.   Cardiovascular: Negative for chest pain and palpitations.  Gastrointestinal: Positive for abdominal pain, anal bleeding, diarrhea, nausea and vomiting. Negative for blood in stool.  Genitourinary: Negative for dysuria.  Musculoskeletal: Positive for back pain (left lower back-side - chronic).  Neurological: Positive for light-headedness. Negative for headaches.       Objective:   Vitals:   07/31/18 1002  BP: (!) 150/84  Pulse: 72  Resp: 16  Temp: 98.1 F (36.7 C)  SpO2: 98%   BP Readings from Last 3 Encounters:  07/31/18 (!) 150/84  07/27/18 (!) 133/106  06/08/18 123/74   Wt Readings from Last 3 Encounters:  07/31/18 142 lb (64.4 kg)  06/08/18 143 lb (64.9 kg)  04/30/18 141 lb 12.8 oz (64.3 kg)   Body mass index is 26.83 kg/m.   Physical Exam    Constitutional: Appears well-developed and well-nourished. No distress.  HENT:  Head: Normocephalic and atraumatic.  Neck: Neck supple. No tracheal deviation present. No thyromegaly present.  No cervical lymphadenopathy Cardiovascular: Normal rate, regular rhythm and normal heart sounds.   No edema Pulmonary/Chest: Effort normal and breath sounds normal. No respiratory distress. No has no wheezes. No  rales. Abdomen: Soft, diffuse tenderness without rebound or guarding.  No distention.  No mass. Skin: Skin is warm and dry. Not diaphoretic.  Psychiatric: Normal mood and affect. Behavior is normal.    Lab Results  Component Value Date   WBC 6.0 07/31/2018   HGB 12.9 07/31/2018   HCT 39.7 07/31/2018   PLT 276.0 07/31/2018   GLUCOSE 95 07/31/2018   CHOL 262 (H) 04/30/2018   TRIG 227.0 (H) 04/30/2018   HDL 49.20 04/30/2018   LDLDIRECT 165.0 04/30/2018   LDLCALC  104 (H) 10/31/2016   ALT 25 07/31/2018   AST 21 07/31/2018   NA 140 07/31/2018   K 3.4 (L) 07/31/2018   CL 108 07/31/2018   CREATININE 1.01 07/31/2018   BUN 9 07/31/2018   CO2 23 07/31/2018   TSH 3.01 05/01/2017   INR 1.00 03/14/2018   HGBA1C 6.4 04/30/2018   MICROALBUR <0.7 10/28/2015   CT ABDOMEN PELVIS W CONTRAST CLINICAL DATA:  Diarrhea.  Nausea vomiting for a week.  EXAM: CT ABDOMEN AND PELVIS WITH CONTRAST  TECHNIQUE: Multidetector CT imaging of the abdomen and pelvis was performed using the standard protocol following bolus administration of intravenous contrast.  CONTRAST:  116m OMNIPAQUE IOHEXOL 300 MG/ML  SOLN  COMPARISON:  12/22/2017 chest CT. 01/01/2018 PET. Abdominopelvic CT of 04/12/2007  FINDINGS: Lower chest: Left lower lobe somewhat irregular density is similar in size to 01/01/2018 PET, favored to represent a mucocele. There are also areas of right middle lobe nodularity which are unchanged and favored to represent mucoceles.  New or increased right lower lobe 1.2 cm nodular density including on image 10/4.  Normal heart size without pericardial or pleural effusion. Tiny hiatal hernia.  Hepatobiliary: Mild hepatic steatosis, without focal liver lesion. Normal gallbladder, without biliary ductal dilatation.  Pancreas: Pancreatic fatty replacement throughout, without duct dilatation or dominant mass.  Spleen: Normal in size, without focal abnormality.  Adrenals/Urinary Tract: Normal adrenal glands. Normal left kidney. Inter/lower pole right renal lesion measures 2.9 cm. Has mild interstitial thickening surrounding and heterogeneity within, including image 34/3. At the site of a simple 7.2 cm cyst back in 2009. Normal urinary bladder.  Stomach/Bowel: The proximal stomach is underdistended but appears thick walled, including at 3.1 cm on 21/3.  Fluid-filled colon. Scattered colonic diverticula. Normal terminal ileum. Normal small bowel.   Vascular/Lymphatic: Aortic and branch vessel atherosclerosis. No abdominopelvic adenopathy.  Reproductive: Central uterine hypoattenuation, including at 1.3 cm on sagittal image 92. No adnexal mass.  Other: Moderate pelvic floor laxity.  No significant free fluid.  Musculoskeletal: Convex left lumbar spine curvature. Lumbosacral spondylosis.  IMPRESSION: 1.  No acute process in the abdomen or pelvis. 2. Fluid-filled colon, consistent with a diarrheal state. 3. Primarily similar bilateral pulmonary nodular densities, favored to represent mucoceles. A right lower lobe density is new or increased. As per the prior PET, chest CT follow-up is recommended. 4. Hepatic steatosis. 5. Gastric underdistention. Cannot exclude concurrent gastritis. Appearance of wall thickening has been present over multiple prior exams. 6.  Aortic Atherosclerosis (ICD10-I70.0). 7. Right renal lesion which demonstrates complexity. Favored to represent an involuting cyst, possibly complicated by prior hemorrhage or infection. Recommend attention on follow-up. 8. Central uterine hypoattenuation, abnormal for age. Correlate with postmenopausal bleeding and consider nonemergent pelvic ultrasound.  Electronically Signed   By: KAbigail MiyamotoM.D.   On: 07/27/2018 12:54    Assessment & Plan:    See Problem List for Assessment and Plan of chronic medical problems.

## 2018-07-31 NOTE — Assessment & Plan Note (Signed)
Diarrhea started 5 days ago and has gotten worse since it started Has had nausea, vomiting, GERD and abdominal discomfort, no fever or cold-like symptoms Blood work was unremarkable No cause seen on CT scan Low risk for infection-has been fairly isolated ?  Microscopic colitis We will check stool studies Advised her to follow-up with Dr. Jeb Levering will call to make an appointment.  May need a colonoscopy Will recheck CMP, CBC to make sure she is not getting dehydrated and no evidence of elevated white blood cell count We will try Lomotil-Imodium is not working [employer]

## 2018-08-01 ENCOUNTER — Emergency Department (HOSPITAL_COMMUNITY): Payer: Medicare Other

## 2018-08-01 ENCOUNTER — Encounter (HOSPITAL_COMMUNITY): Payer: Self-pay | Admitting: Emergency Medicine

## 2018-08-01 ENCOUNTER — Other Ambulatory Visit: Payer: Self-pay

## 2018-08-01 ENCOUNTER — Inpatient Hospital Stay (HOSPITAL_COMMUNITY)
Admission: EM | Admit: 2018-08-01 | Discharge: 2018-08-10 | DRG: 394 | Disposition: A | Discharge status: Home Health | Payer: Medicare Other | Attending: Internal Medicine | Admitting: Internal Medicine

## 2018-08-01 DIAGNOSIS — R55 Syncope and collapse: Secondary | ICD-10-CM | POA: Diagnosis not present

## 2018-08-01 DIAGNOSIS — K640 First degree hemorrhoids: Secondary | ICD-10-CM | POA: Diagnosis present

## 2018-08-01 DIAGNOSIS — N179 Acute kidney failure, unspecified: Secondary | ICD-10-CM | POA: Diagnosis present

## 2018-08-01 DIAGNOSIS — D132 Benign neoplasm of duodenum: Secondary | ICD-10-CM | POA: Diagnosis present

## 2018-08-01 DIAGNOSIS — G5600 Carpal tunnel syndrome, unspecified upper limb: Secondary | ICD-10-CM | POA: Diagnosis present

## 2018-08-01 DIAGNOSIS — Z825 Family history of asthma and other chronic lower respiratory diseases: Secondary | ICD-10-CM

## 2018-08-01 DIAGNOSIS — K2970 Gastritis, unspecified, without bleeding: Secondary | ICD-10-CM | POA: Diagnosis present

## 2018-08-01 DIAGNOSIS — E785 Hyperlipidemia, unspecified: Secondary | ICD-10-CM | POA: Diagnosis not present

## 2018-08-01 DIAGNOSIS — M1990 Unspecified osteoarthritis, unspecified site: Secondary | ICD-10-CM | POA: Diagnosis present

## 2018-08-01 DIAGNOSIS — I2510 Atherosclerotic heart disease of native coronary artery without angina pectoris: Secondary | ICD-10-CM | POA: Diagnosis present

## 2018-08-01 DIAGNOSIS — R5383 Other fatigue: Secondary | ICD-10-CM | POA: Diagnosis present

## 2018-08-01 DIAGNOSIS — R197 Diarrhea, unspecified: Secondary | ICD-10-CM

## 2018-08-01 DIAGNOSIS — K644 Residual hemorrhoidal skin tags: Secondary | ICD-10-CM | POA: Diagnosis present

## 2018-08-01 DIAGNOSIS — E1159 Type 2 diabetes mellitus with other circulatory complications: Secondary | ICD-10-CM | POA: Diagnosis present

## 2018-08-01 DIAGNOSIS — Z8249 Family history of ischemic heart disease and other diseases of the circulatory system: Secondary | ICD-10-CM

## 2018-08-01 DIAGNOSIS — Z86711 Personal history of pulmonary embolism: Secondary | ICD-10-CM | POA: Diagnosis not present

## 2018-08-01 DIAGNOSIS — I10 Essential (primary) hypertension: Secondary | ICD-10-CM | POA: Diagnosis present

## 2018-08-01 DIAGNOSIS — E876 Hypokalemia: Secondary | ICD-10-CM | POA: Diagnosis not present

## 2018-08-01 DIAGNOSIS — R112 Nausea with vomiting, unspecified: Secondary | ICD-10-CM | POA: Diagnosis present

## 2018-08-01 DIAGNOSIS — I119 Hypertensive heart disease without heart failure: Secondary | ICD-10-CM | POA: Diagnosis present

## 2018-08-01 DIAGNOSIS — J45909 Unspecified asthma, uncomplicated: Secondary | ICD-10-CM | POA: Diagnosis present

## 2018-08-01 DIAGNOSIS — E1169 Type 2 diabetes mellitus with other specified complication: Secondary | ICD-10-CM | POA: Diagnosis present

## 2018-08-01 DIAGNOSIS — M4150 Other secondary scoliosis, site unspecified: Secondary | ICD-10-CM | POA: Diagnosis not present

## 2018-08-01 DIAGNOSIS — F329 Major depressive disorder, single episode, unspecified: Secondary | ICD-10-CM | POA: Diagnosis not present

## 2018-08-01 DIAGNOSIS — K55019 Acute (reversible) ischemia of small intestine, extent unspecified: Secondary | ICD-10-CM | POA: Diagnosis not present

## 2018-08-01 DIAGNOSIS — K760 Fatty (change of) liver, not elsewhere classified: Secondary | ICD-10-CM | POA: Diagnosis present

## 2018-08-01 DIAGNOSIS — Z9111 Patient's noncompliance with dietary regimen: Secondary | ICD-10-CM

## 2018-08-01 DIAGNOSIS — K601 Chronic anal fissure: Secondary | ICD-10-CM | POA: Diagnosis present

## 2018-08-01 DIAGNOSIS — K5730 Diverticulosis of large intestine without perforation or abscess without bleeding: Secondary | ICD-10-CM | POA: Diagnosis present

## 2018-08-01 DIAGNOSIS — K449 Diaphragmatic hernia without obstruction or gangrene: Secondary | ICD-10-CM | POA: Diagnosis present

## 2018-08-01 DIAGNOSIS — K317 Polyp of stomach and duodenum: Secondary | ICD-10-CM | POA: Diagnosis present

## 2018-08-01 DIAGNOSIS — Z1159 Encounter for screening for other viral diseases: Secondary | ICD-10-CM | POA: Diagnosis not present

## 2018-08-01 DIAGNOSIS — M8580 Other specified disorders of bone density and structure, unspecified site: Secondary | ICD-10-CM | POA: Diagnosis present

## 2018-08-01 DIAGNOSIS — R1013 Epigastric pain: Secondary | ICD-10-CM | POA: Diagnosis not present

## 2018-08-01 DIAGNOSIS — Z87891 Personal history of nicotine dependence: Secondary | ICD-10-CM

## 2018-08-01 DIAGNOSIS — K219 Gastro-esophageal reflux disease without esophagitis: Secondary | ICD-10-CM | POA: Diagnosis present

## 2018-08-01 DIAGNOSIS — E1122 Type 2 diabetes mellitus with diabetic chronic kidney disease: Secondary | ICD-10-CM

## 2018-08-01 DIAGNOSIS — I5189 Other ill-defined heart diseases: Secondary | ICD-10-CM

## 2018-08-01 DIAGNOSIS — F488 Other specified nonpsychotic mental disorders: Secondary | ICD-10-CM | POA: Diagnosis not present

## 2018-08-01 DIAGNOSIS — E119 Type 2 diabetes mellitus without complications: Secondary | ICD-10-CM

## 2018-08-01 DIAGNOSIS — E860 Dehydration: Secondary | ICD-10-CM | POA: Diagnosis present

## 2018-08-01 DIAGNOSIS — Z86718 Personal history of other venous thrombosis and embolism: Secondary | ICD-10-CM

## 2018-08-01 DIAGNOSIS — L409 Psoriasis, unspecified: Secondary | ICD-10-CM | POA: Diagnosis present

## 2018-08-01 DIAGNOSIS — Z7901 Long term (current) use of anticoagulants: Secondary | ICD-10-CM

## 2018-08-01 DIAGNOSIS — M419 Scoliosis, unspecified: Secondary | ICD-10-CM | POA: Diagnosis present

## 2018-08-01 DIAGNOSIS — Z79899 Other long term (current) drug therapy: Secondary | ICD-10-CM

## 2018-08-01 DIAGNOSIS — Z881 Allergy status to other antibiotic agents status: Secondary | ICD-10-CM

## 2018-08-01 DIAGNOSIS — E1121 Type 2 diabetes mellitus with diabetic nephropathy: Secondary | ICD-10-CM | POA: Diagnosis not present

## 2018-08-01 DIAGNOSIS — Z888 Allergy status to other drugs, medicaments and biological substances status: Secondary | ICD-10-CM

## 2018-08-01 DIAGNOSIS — R42 Dizziness and giddiness: Secondary | ICD-10-CM

## 2018-08-01 DIAGNOSIS — D131 Benign neoplasm of stomach: Secondary | ICD-10-CM | POA: Diagnosis present

## 2018-08-01 DIAGNOSIS — I2699 Other pulmonary embolism without acute cor pulmonale: Secondary | ICD-10-CM | POA: Diagnosis present

## 2018-08-01 DIAGNOSIS — E118 Type 2 diabetes mellitus with unspecified complications: Secondary | ICD-10-CM | POA: Diagnosis not present

## 2018-08-01 DIAGNOSIS — Z9114 Patient's other noncompliance with medication regimen: Secondary | ICD-10-CM

## 2018-08-01 DIAGNOSIS — Z885 Allergy status to narcotic agent status: Secondary | ICD-10-CM

## 2018-08-01 DIAGNOSIS — Z860100 Personal history of colon polyps, unspecified: Secondary | ICD-10-CM

## 2018-08-01 LAB — COMPREHENSIVE METABOLIC PANEL
ALT: 33 U/L (ref 0–44)
AST: 26 U/L (ref 15–41)
Albumin: 3.6 g/dL (ref 3.5–5.0)
Alkaline Phosphatase: 63 U/L (ref 38–126)
Anion gap: 9 (ref 5–15)
BUN: 9 mg/dL (ref 8–23)
CO2: 17 mmol/L — ABNORMAL LOW (ref 22–32)
Calcium: 8.8 mg/dL — ABNORMAL LOW (ref 8.9–10.3)
Chloride: 113 mmol/L — ABNORMAL HIGH (ref 98–111)
Creatinine, Ser: 1.7 mg/dL — ABNORMAL HIGH (ref 0.44–1.00)
GFR calc Af Amer: 34 mL/min — ABNORMAL LOW (ref >60)
GFR calc non Af Amer: 29 mL/min — ABNORMAL LOW (ref >60)
Glucose, Bld: 108 mg/dL — ABNORMAL HIGH (ref 70–99)
Potassium: 3.4 mmol/L — ABNORMAL LOW (ref 3.5–5.1)
Sodium: 139 mmol/L (ref 135–145)
Total Bilirubin: 1.5 mg/dL — ABNORMAL HIGH (ref 0.3–1.2)
Total Protein: 6.7 g/dL (ref 6.5–8.1)

## 2018-08-01 LAB — C DIFFICILE QUICK SCREEN W PCR REFLEX
C Diff antigen: NEGATIVE
C Diff interpretation: NOT DETECTED
C Diff toxin: NEGATIVE

## 2018-08-01 LAB — URINALYSIS, ROUTINE W REFLEX MICROSCOPIC
Bilirubin Urine: NEGATIVE
Glucose, UA: NEGATIVE mg/dL
Hgb urine dipstick: NEGATIVE
Ketones, ur: 5 mg/dL — AB
Nitrite: NEGATIVE
Protein, ur: 100 mg/dL — AB
Specific Gravity, Urine: 1.02 (ref 1.005–1.030)
pH: 5 (ref 5.0–8.0)

## 2018-08-01 LAB — GLUCOSE, CAPILLARY: Glucose-Capillary: 74 mg/dL (ref 70–99)

## 2018-08-01 LAB — LACTIC ACID, PLASMA: Lactic Acid, Venous: 1.2 mmol/L (ref 0.5–1.9)

## 2018-08-01 LAB — CBC
HCT: 40.6 % (ref 36.0–46.0)
Hemoglobin: 12.7 g/dL (ref 12.0–15.0)
MCH: 31.3 pg (ref 26.0–34.0)
MCHC: 31.3 g/dL (ref 30.0–36.0)
MCV: 100 fL (ref 80.0–100.0)
Platelets: 266 10*3/uL (ref 150–400)
RBC: 4.06 MIL/uL (ref 3.87–5.11)
RDW: 14.1 % (ref 11.5–15.5)
WBC: 7.8 10*3/uL (ref 4.0–10.5)
nRBC: 0 % (ref 0.0–0.2)

## 2018-08-01 LAB — POC OCCULT BLOOD, ED: Fecal Occult Bld: POSITIVE — AB

## 2018-08-01 LAB — MAGNESIUM: Magnesium: 1 mg/dL — ABNORMAL LOW (ref 1.7–2.4)

## 2018-08-01 LAB — LIPASE, BLOOD: Lipase: 32 U/L (ref 11–51)

## 2018-08-01 MED ORDER — ONDANSETRON HCL 4 MG/2ML IJ SOLN
4.0000 mg | Freq: Four times a day (QID) | INTRAMUSCULAR | Status: DC | PRN
  Administered 2018-08-01 – 2018-08-05 (×8): 4 mg via INTRAVENOUS
  Filled 2018-08-01 (×8): qty 2

## 2018-08-01 MED ORDER — LACTATED RINGERS IV SOLN
INTRAVENOUS | Status: DC
  Administered 2018-08-01 – 2018-08-04 (×2): via INTRAVENOUS

## 2018-08-01 MED ORDER — LORATADINE 10 MG PO TABS: 10.0000 mg | ORAL_TABLET | Freq: Every day | ORAL | Status: DC | PRN

## 2018-08-01 MED ORDER — INSULIN ASPART 100 UNIT/ML ~~LOC~~ SOLN: 0.0000 [IU] | Freq: Every day | SUBCUTANEOUS | Status: DC

## 2018-08-01 MED ORDER — DIAZEPAM 2 MG PO TABS: 2.0000 mg | ORAL_TABLET | Freq: Four times a day (QID) | ORAL | Status: DC | PRN

## 2018-08-01 MED ORDER — LACTATED RINGERS IV BOLUS
1000.0000 mL | Freq: Once | INTRAVENOUS | Status: AC
  Administered 2018-08-01: 1000 mL via INTRAVENOUS

## 2018-08-01 MED ORDER — METRONIDAZOLE IN NACL 5-0.79 MG/ML-% IV SOLN
500.0000 mg | Freq: Three times a day (TID) | INTRAVENOUS | Status: DC
  Administered 2018-08-01 – 2018-08-06 (×13): 500 mg via INTRAVENOUS
  Filled 2018-08-01 (×13): qty 100

## 2018-08-01 MED ORDER — INSULIN ASPART 100 UNIT/ML ~~LOC~~ SOLN: 0.0000 [IU] | Freq: Three times a day (TID) | SUBCUTANEOUS | Status: DC

## 2018-08-01 MED ORDER — EZETIMIBE 10 MG PO TABS
10.0000 mg | ORAL_TABLET | Freq: Every day | ORAL | Status: DC
  Administered 2018-08-05: 10 mg via ORAL
  Filled 2018-08-01 (×3): qty 1

## 2018-08-01 MED ORDER — COQ10 200 MG PO CAPS: 200.0000 mg | ORAL_CAPSULE | Freq: Every day | ORAL | Status: DC

## 2018-08-01 MED ORDER — ONDANSETRON HCL 4 MG PO TABS: 4.0000 mg | ORAL_TABLET | Freq: Three times a day (TID) | ORAL | Status: DC | PRN

## 2018-08-01 MED ORDER — VITAMIN D 25 MCG (1000 UNIT) PO TABS
2000.0000 [IU] | ORAL_TABLET | Freq: Every day | ORAL | Status: DC
  Administered 2018-08-02: 2000 [IU] via ORAL
  Filled 2018-08-01 (×8): qty 2

## 2018-08-01 MED ORDER — DIPHENHYDRAMINE HCL (SLEEP) 25 MG PO TBDP: 12.0000 mg | ORAL_TABLET | Freq: Every day | ORAL | Status: DC

## 2018-08-01 MED ORDER — MAGNESIUM SULFATE IN D5W 1-5 GM/100ML-% IV SOLN
1.0000 g | Freq: Once | INTRAVENOUS | Status: AC
  Administered 2018-08-01: 1 g via INTRAVENOUS
  Filled 2018-08-01: qty 100

## 2018-08-01 MED ORDER — RIVAROXABAN 20 MG PO TABS
20.0000 mg | ORAL_TABLET | Freq: Every day | ORAL | Status: DC
  Administered 2018-08-03 – 2018-08-09 (×6): 20 mg via ORAL
  Filled 2018-08-01 (×8): qty 1

## 2018-08-01 MED ORDER — HALOBETASOL PROPIONATE 0.05 % EX CREA: 1.0000 "application " | TOPICAL_CREAM | Freq: Every day | CUTANEOUS | Status: DC

## 2018-08-01 MED ORDER — SODIUM CHLORIDE 0.9 % IV BOLUS: 1000.0000 mL | Freq: Once | INTRAVENOUS | Status: DC

## 2018-08-01 MED ORDER — SODIUM CHLORIDE 0.9% FLUSH
3.0000 mL | Freq: Once | INTRAVENOUS | Status: AC
  Administered 2018-08-01: 3 mL via INTRAVENOUS

## 2018-08-01 MED ORDER — ENSURE ENLIVE PO LIQD: 237.0000 mL | Freq: Two times a day (BID) | ORAL | Status: DC

## 2018-08-01 MED ORDER — ANIMAL SHAPES WITH C & FA PO CHEW: 1.0000 | CHEWABLE_TABLET | Freq: Every day | ORAL | Status: DC

## 2018-08-01 MED ORDER — NEBIVOLOL HCL 2.5 MG PO TABS
2.5000 mg | ORAL_TABLET | Freq: Every day | ORAL | Status: DC
  Administered 2018-08-01 – 2018-08-07 (×5): 2.5 mg via ORAL
  Filled 2018-08-01 (×7): qty 1

## 2018-08-01 MED ORDER — CLOBETASOL PROPIONATE 0.05 % EX CREA
1.0000 "application " | TOPICAL_CREAM | Freq: Every day | CUTANEOUS | Status: DC | PRN
  Filled 2018-08-01: qty 15

## 2018-08-01 MED ORDER — PRAVASTATIN SODIUM 10 MG PO TABS
20.0000 mg | ORAL_TABLET | Freq: Every day | ORAL | Status: DC
  Filled 2018-08-01 (×6): qty 2

## 2018-08-01 MED ORDER — ETANERCEPT 50 MG/ML ~~LOC~~ SOAJ: 50.0000 mg | SUBCUTANEOUS | Status: DC

## 2018-08-01 MED ORDER — CIPROFLOXACIN IN D5W 400 MG/200ML IV SOLN
400.0000 mg | INTRAVENOUS | Status: DC
  Administered 2018-08-01 – 2018-08-04 (×4): 400 mg via INTRAVENOUS
  Filled 2018-08-01 (×4): qty 200

## 2018-08-01 MED ORDER — ONDANSETRON HCL 4 MG PO TABS: 4.0000 mg | ORAL_TABLET | Freq: Four times a day (QID) | ORAL | Status: DC | PRN

## 2018-08-01 MED ORDER — PANTOPRAZOLE SODIUM 40 MG PO TBEC
40.0000 mg | DELAYED_RELEASE_TABLET | Freq: Every day | ORAL | Status: DC
  Administered 2018-08-02: 40 mg via ORAL
  Filled 2018-08-01: qty 1

## 2018-08-01 NOTE — ED Notes (Signed)
Doctor at bedside.

## 2018-08-01 NOTE — Progress Notes (Signed)
Patient arrived to unit [place] bed 19 from emergency room.Assisted to bed by staff.Oriented patient to nursing unit and call bell system.Educated patient to call for assistance prior to getting out of bed and verbalized understanding.No acute distress noted at present time. Will continue to monitor.

## 2018-08-01 NOTE — ED Provider Notes (Signed)
[facility] EMERGENCY DEPARTMENT Provider Note   CSN: [id number] Arrival date & time: 08/01/18  1546     History   Chief Complaint Chief Complaint  Patient presents with  . Nausea  . Diarrhea  . Loss of Consciousness    HPI NGOC DETJEN is a 73 y.o. female.     HPI  Patient is a 73 year old female past medical history of hyperlipidemia, hypertension, asthma, arthritis, type 2 diabetes mellitus, chronic rectal fissure presenting for diarrhea, nausea, and episode of syncope today.  Patient reports that she has had diarrhea for approximately 3 weeks.  It began getting better last week however over the past 5 days she has had greater than 8 episodes of watery stool per day.  She also reports nausea with intermittent vomiting.  She has not vomited today.  She reports that she has had some hemorrhoidal bleeding as result of the diarrhea and has seen some streaks of bright red blood, but no hematochezia no melena.  She does report she is developing more abdominal pain than when she presented last week.  Is primarily in the lower abdomen and crampy in nature.  Patient reports that her diarrhea is gotten significantly worse the past 24 hours and as she was walking back from the bathroom she thought she was going to pass out and her vision went "amber".  She reports that she lowered herself to the ground and sat down on the floor.  She reports that she woke up lying on the floor.  She denied having any chest pain or shortness of breath.  She called her daughter who transferred her to the emergency department.  Past Medical History:  Diagnosis Date  . ALLERGIC RHINITIS   . ANEMIA-NOS   . Arthritis   . ASTHMA   . Carpal tunnel syndrome   . COLONIC POLYPS, HX OF   . Coronary artery disease    "mild CAD" noted on 12/05/17 in coronary CT scan  . DIABETES MELLITUS, TYPE II    diet controlled  . GERD   . HYPERLIPIDEMIA   . HYPERTENSION   . OSTEOPENIA   . PONV  (postoperative nausea and vomiting)   . Psoriasis    severe, began soriatane 01/2012  . Rectal fissure   . Scoliosis     Patient Active Problem List   Diagnosis Date Noted  . Abnormal CT scan, pelvis 07/31/2018  . Pneumothorax 03/14/2018  . S/P bronchoscopy with biopsy   . Lung abnormality 01/11/2018  . Pulmonary embolism ([place]) 12/24/2017  . Lung mass 12/24/2017  . Small vessel disease, cerebrovascular 07/31/2017  . Atypical chest pain 06/14/2017  . Dizziness 06/14/2017  . Easy bruising 05/30/2017  . Hyperuricemia 05/03/2017  . Arthralgia 05/01/2017  . Hair loss 05/01/2017  . Vitamin D deficiency 10/31/2016  . Redness of skin, feet 10/31/2016  . Diarrhea 06/06/2016  . Diastolic dysfunction 82/50/5397  . Vertigo 12/24/2015  . Fatigue 12/24/2015  . Nonspecific abnormal electrocardiogram (ECG) (EKG) 12/24/2015  . Bilateral carotid artery disease, Mild 05/31/2015  . Thyroid nodule 05/31/2015  . Psoriasis   . Scoliosis   . Diabetes type 2, controlled ([place]) 06/03/2008  . Dyslipidemia 06/03/2008  . CARPAL TUNNEL SYNDROME 06/03/2008  . HTN (hypertension) 06/03/2008  . ALLERGIC RHINITIS 06/03/2008  . Asthma 06/03/2008  . GERD (gastroesophageal reflux disease) 06/03/2008  . Osteopenia 06/03/2008  . COLONIC POLYPS, HX OF 06/03/2008    Past Surgical History:  Procedure Laterality Date  . benign rectal growth  2004   removed by Dr. Zella Richer  . BREAST BIOPSY Right   . BREAST EXCISIONAL BIOPSY Left   . BREAST SURGERY  1988   biopsy  . CESAREAN SECTION    . VIDEO BRONCHOSCOPY WITH ENDOBRONCHIAL NAVIGATION N/A 03/14/2018   Procedure: VIDEO BRONCHOSCOPY WITH ENDOBRONCHIAL NAVIGATION;  Surgeon: Collene Gobble, MD;  Location: [facility] OR;  Service: Thoracic;  Laterality: N/A;     OB History   No obstetric history on file.      Home Medications    Prior to Admission medications   Medication Sig Start Date End Date Taking? Authorizing Provider  blood glucose meter kit and  supplies KIT Dispense based on patient and insurance preference. ONE TOUCH ULTRA 05/01/17   [name], MD  Cholecalciferol (VITAMIN D3) 2000 units TABS Take 2,000 Units by mouth at bedtime.     [provider]  clindamycin (CLEOCIN T) 1 % external solution Apply 1 application topically daily as needed (cuts). 01/10/18   [name], MD  clobetasol (TEMOVATE) 0.05 % external solution Apply 1 application topically daily as needed (psoriasis).  04/17/17 04/17/18  [provider]  Coenzyme Q10 (COQ10) 200 MG CAPS Take 200 mg by mouth at bedtime. Gummies    [provider]  diazepam (VALIUM) 2 MG tablet Take 1 tablet (2 mg total) by mouth every 6 (six) hours as needed (dizziness). 04/30/18   [name], MD  diphenhydrAMINE HCl, Sleep, 25 MG TBDP Take 12 mg by mouth at bedtime.    [provider]  diphenoxylate-atropine (LOMOTIL) 2.5-0.025 MG tablet Take 2 tablets by mouth 4 (four) times daily as needed for diarrhea or loose stools. 07/31/18   [name], MD  ENBREL SURECLICK 50 MG/ML injection Inject 50 mg into the skin once a week.  12/05/12   [provider]  ezetimibe (ZETIA) 10 MG tablet Take 1 tablet (10 mg total) by mouth daily. 06/08/18   Dorothy Spark, MD  glucose blood (ONE TOUCH ULTRA TEST) test strip Use test strip to check blood sugar at least 3 times 05/01/17   [name], MD  halobetasol (ULTRAVATE) 0.05 % cream Apply 1 application topically daily. 10/24/16   [provider]  Lancets (ONETOUCH ULTRASOFT) lancets Use lancet to test blood sugar at least twice a day or as directed by your physician. 05/01/17   [name], MD  loratadine (CLARITIN REDITABS) 10 MG dissolvable tablet Take 10 mg by mouth as needed for allergies.     [provider]  nebivolol (BYSTOLIC) 2.5 MG tablet Take 1 tablet (2.5 mg total) by mouth daily. 04/30/18   [name], MD  olmesartan (BENICAR) 20 MG tablet Take 1 tablet (20 mg total)  by mouth 2 (two) times daily. 05/01/18   [name], MD  omeprazole (PRILOSEC) 20 MG capsule Take 20 mg by mouth daily.     [provider]  ondansetron (ZOFRAN) 4 MG tablet Take 4 mg by mouth every 8 (eight) hours as needed for nausea or vomiting.    [provider]  oxymetazoline (AFRIN 12 HOUR) 0.05 % nasal spray Place 1 spray into the nose 3 (three) times daily as needed for congestion.     [provider]  Pediatric Multiple Vit-C-FA (MULTIVITAMIN ANIMAL SHAPES, WITH CA/FA,) with C & FA chewable tablet Chew 1 tablet by mouth at bedtime.     [provider]  Pitavastatin Calcium 1 MG TABS Take 1 tablet (1  mg total) by mouth 3 (three) times a week. 06/08/18   Dorothy Spark, MD  rivaroxaban (XARELTO) 20 MG TABS tablet Take 1 tablet (20 mg total) by mouth daily with supper. OK to restart this medication on 03/15/2018. 03/16/18   Donita Brooks, NP    Family History Family History  Problem Relation Age of Onset  . Lung cancer Father   . Arthritis Other        Parents  . Asthma Other        parent, other relative  . Breast cancer Other        other relative  . Hypertension Other        parent, other relative  . Heart disease Other        parent, other relative  . Heart disease Mother   . Asthma Mother   . Breast cancer Maternal Aunt 68  . Parkinson's disease Maternal Grandmother   . Rheumatic fever Maternal Grandfather     Social History Social History   Tobacco Use  . Smoking status: Former Smoker    Packs/day: 1.00    Years: 10.00    Pack years: 10.00    Quit date: 02/14/1974    Years since quitting: 44.4  . Smokeless tobacco: Never Used  Substance Use Topics  . Alcohol use: No  . Drug use: No     Allergies   Amlodipine, Clarithromycin, Codeine, Erythromycin, Statins, and Tetracycline   Review of Systems Review of Systems  Constitutional: Negative for chills and fever.  HENT: Negative for congestion and sore throat.    Eyes: Negative for visual disturbance.  Respiratory: Negative for cough, chest tightness and shortness of breath.   Cardiovascular: Negative for chest pain, palpitations and leg swelling.  Gastrointestinal: Positive for abdominal pain, anal bleeding, diarrhea, nausea and vomiting. Negative for constipation.  Genitourinary: Negative for dysuria and flank pain.  Musculoskeletal: Negative for back pain and myalgias.  Skin: Negative for rash.  Neurological: Positive for syncope and light-headedness. Negative for dizziness and headaches.     Physical Exam Updated Vital Signs BP (!) 92/58 (BP Location: Left Arm)   Pulse 72   Temp 98.5 F (36.9 C) (Oral)   Resp 16   Ht '5\' 1"'  (1.549 m)   Wt 64.4 kg   SpO2 98%   BMI 26.83 kg/m   Physical Exam Vitals signs and nursing note reviewed.  Constitutional:      General: She is not in acute distress.    Appearance: She is well-developed.  HENT:     Head: Normocephalic and atraumatic.     Mouth/Throat:     Mouth: Mucous membranes are dry.  Eyes:     Conjunctiva/sclera: Conjunctivae normal.     Pupils: Pupils are equal, round, and reactive to light.  Neck:     Musculoskeletal: Normal range of motion and neck supple.  Cardiovascular:     Rate and Rhythm: Normal rate and regular rhythm.     Heart sounds: S1 normal and S2 normal. No murmur.  Pulmonary:     Effort: Pulmonary effort is normal.     Breath sounds: Normal breath sounds. No wheezing or rales.  Abdominal:     General: There is no distension.     Palpations: Abdomen is soft.     Tenderness: There is abdominal tenderness. There is no guarding.     Comments: Mild lower abdominal tenderness without focality.   Genitourinary:    Comments: Rectal exam demonstrating excoriated tissue surrounding  the rectum.  Patient has multiple nonthrombosed external hemorrhoids.  Chronic and nonbleeding fissure present. Musculoskeletal: Normal range of motion.     Comments: Kyphosis and scoliosis  of spine.   Lymphadenopathy:     Cervical: No cervical adenopathy.  Skin:    General: Skin is warm and dry.     Findings: No erythema or rash.  Neurological:     Mental Status: She is alert.     Comments: Cranial nerves grossly intact. Patient moves extremities symmetrically and with good coordination.  Psychiatric:        Behavior: Behavior normal.        Thought Content: Thought content normal.        Judgment: Judgment normal.      ED Treatments / Results  Labs (all labs ordered are listed, but only abnormal results are displayed) Labs Reviewed  COMPREHENSIVE METABOLIC PANEL - Abnormal; Notable for the following components:      Result Value   Potassium 3.4 (*)    Chloride 113 (*)    CO2 17 (*)    Glucose, Bld 108 (*)    Creatinine, Ser 1.70 (*)    Calcium 8.8 (*)    Total Bilirubin 1.5 (*)    GFR calc non Af Amer 29 (*)    GFR calc Af Amer 34 (*)    All other components within normal limits  GASTROINTESTINAL PANEL BY PCR, STOOL (REPLACES STOOL CULTURE)  C DIFFICILE QUICK SCREEN W PCR REFLEX  LIPASE, BLOOD  CBC  URINALYSIS, ROUTINE W REFLEX MICROSCOPIC  LACTIC ACID, PLASMA  LACTIC ACID, PLASMA  MAGNESIUM  OCCULT BLOOD X 1 CARD TO LAB, STOOL  CBG MONITORING, ED    EKG EKG Interpretation  Date/Time:  Wednesday August 01 2018 16:03:35 EDT Ventricular Rate:  71 PR Interval:  160 QRS Duration: 90 QT Interval:  398 QTC Calculation: 432 R Axis:   -13 Text Interpretation:  Normal sinus rhythm Minimal voltage criteria for LVH, may be normal variant Inferior infarct , age undetermined Anterior infarct , age undetermined Abnormal ECG No significant change since last tracing Confirmed by Deno Etienne [phone]) on 08/01/2018 5:42:46 PM   Radiology No results found.  Procedures Procedures (including critical care time)  Medications Ordered in ED Medications  sodium chloride flush (NS) 0.9 % injection 3 mL (has no administration in time range)  lactated ringers  bolus 1,000 mL (has no administration in time range)     Initial Impression / Assessment and Plan / ED Course  I have reviewed the triage vital signs and the nursing notes.  Pertinent labs & imaging results that were available during my care of the patient were reviewed by me and considered in my medical decision making (see chart for details).  Clinical Course as of Jul 31 1908  Wed Aug 01, 2018  1758 Suggestive of dehydration.   CO2(!): 17 [AM]  1758 Evidence of AKI.   Creatinine(!): 1.70 [AM]  1910 Spoke with Dr. Jonelle Sidle who will admit patient.  Appreciate his involvement.   [AM]    Clinical Course User Index [AM] Albesa Seen, PA-C       Patient is nontoxic-appearing, afebrile, and hemodynamically stable on my exam.  Initial triage blood pressure was 92/58, however this improved to systolic of 024.  Clinically, patient appears dry.  Suspect that patient's syncope is in relation to volume depletion by history, and patient story not consistent with cardiogenic syncope.  She did not have any head trauma in her  syncopal event today.  Also doubt pulmonary embolism as cause of patient's syncope as she is on Xarelto for previous PE.  Work-up demonstrating no leukocytosis.  Patient has AKI with creatinine of 1.7, up from patient's baseline around 1.  No elevation in BUN.  Magnesium is 1.  Fecal occult blood is positive.  Lactic acid is normal at 1.2.  EKG normal sinus rhythm without evidence of acute ischemia, infarction, or arrhythmia or QT prolongation.  Patient received lactated Ringer's and magnesium supplementation here in the emergency department.  She is admitted to tried hospitalist.  Appreciate their involvement.  This is a shared visit with Dr. Deno Etienne. Patient was independently evaluated by this attending physician. Attending physician consulted in evaluation and admission management.  Final Clinical Impressions(s) / ED Diagnoses   Final diagnoses:  Diarrhea, unspecified  type  AKI (acute kidney injury) [facility])  Hypomagnesemia    ED Discharge Orders    None       Tamala Julian 08/01/18 [place], [place], DO 08/01/18 1936

## 2018-08-01 NOTE — ED Triage Notes (Signed)
Pt states she has had nausea for a week. Pt started having "severe diarrhea" for a week. States the diarrhea is only getting worse. Pt feeling very weak all over. Pt states she passed out today. Denies CP or SOB prior to the episode.

## 2018-08-01 NOTE — H&P (Signed)
History and Physical   [name] XLK:[id number] DOB: 08-10-1945 DOA: 08/01/2018  Referring MD/NP/PA: Dr. Tyrone Nine  PCP: [name], MD   Outpatient Specialists: None  Patient coming from: Home  Chief Complaint: Nausea vomiting and diarrhea  HPI: [name] is a 73 y.o. female with medical history significant of diabetes, hypertension, coronary artery disease, GERD, vertigo, scoliosis, hyperlipidemia, asthma and chronic anticoagulation with Xarelto who presented with 1 week of persistent diarrhea with some nausea.  Patient reported up to 8-10 stools a day.  Associated with persistent nausea and decreased oral intake.  She has been trying to keep up with fluids.  Denied any fever, denied any sick contact.  Denies eating outside the house.  No canned foods.  Patient has been trying to handle it at home not going out of her house at all.  Today however she was going to the bathroom when she felt dizzy weak and passed out.  She therefore decided to come to the ER.  She was found to be orthostatic here with signs of dehydration so patient is being admitted for work-up.  Patient reported seeing streaks of blood in her stool today..  ED Course: Initial temperature is 98.5 with blood pressure 92/58 pulse 76 respiratory rate of 16 oxygen sat 98% on room air.  Patient was also orthostatic.  Labs showed a white count 7.8 hemoglobin 12.7 and platelets of 266.  Sodium is 139 potassium 3.4 chloride 113.  Lactic acid is 1.2.  Magnesium 1.0.  Urinalysis not impressive. Fecal occult blood testing was positive x2.  CT abdomen pelvis showed acute enteritis and stool studies currently pending.  Patient is being admitted therefore for treatment and work-up.  Review of Systems: As per HPI otherwise 10 point review of systems negative.    Past Medical History:  Diagnosis Date  . ALLERGIC RHINITIS   . ANEMIA-NOS   . Arthritis   . ASTHMA   . Carpal tunnel syndrome   . COLONIC POLYPS, HX OF   .  Coronary artery disease    "mild CAD" noted on 12/05/17 in coronary CT scan  . DIABETES MELLITUS, TYPE II    diet controlled  . GERD   . HYPERLIPIDEMIA   . HYPERTENSION   . OSTEOPENIA   . PONV (postoperative nausea and vomiting)   . Psoriasis    severe, began soriatane 01/2012  . Rectal fissure   . Scoliosis     Past Surgical History:  Procedure Laterality Date  . benign rectal growth  2004   removed by Dr. Zella Richer  . BREAST BIOPSY Right   . BREAST EXCISIONAL BIOPSY Left   . BREAST SURGERY  1988   biopsy  . CESAREAN SECTION    . VIDEO BRONCHOSCOPY WITH ENDOBRONCHIAL NAVIGATION N/A 03/14/2018   Procedure: VIDEO BRONCHOSCOPY WITH ENDOBRONCHIAL NAVIGATION;  Surgeon: Collene Gobble, MD;  Location: [facility];  Service: Thoracic;  Laterality: N/A;     reports that she quit smoking about 44 years ago. She has a 10.00 pack-year smoking history. She has never used smokeless tobacco. She reports that she does not drink alcohol or use drugs.  Allergies  Allergen Reactions  . Amlodipine Swelling  . Clarithromycin Nausea Only    I can pass out   . Codeine Nausea Only    I can pass out   . Erythromycin     Other reaction(s): Vomiting (intolerance)  . Statins     Muscle pain   . Tetracycline  Family History  Problem Relation Age of Onset  . Lung cancer Father   . Arthritis Other        Parents  . Asthma Other        parent, other relative  . Breast cancer Other        other relative  . Hypertension Other        parent, other relative  . Heart disease Other        parent, other relative  . Heart disease Mother   . Asthma Mother   . Breast cancer Maternal Aunt 68  . Parkinson's disease Maternal Grandmother   . Rheumatic fever Maternal Grandfather      Prior to Admission medications   Medication Sig Start Date End Date Taking? Authorizing Provider  blood glucose meter kit and supplies KIT Dispense based on patient and insurance preference. ONE TOUCH ULTRA 05/01/17    [name], MD  Cholecalciferol (VITAMIN D3) 2000 units TABS Take 2,000 Units by mouth at bedtime.     [provider]  clindamycin (CLEOCIN T) 1 % external solution Apply 1 application topically daily as needed (cuts). 01/10/18   [name], MD  clobetasol (TEMOVATE) 0.05 % external solution Apply 1 application topically daily as needed (psoriasis).  04/17/17 04/17/18  [provider]  Coenzyme Q10 (COQ10) 200 MG CAPS Take 200 mg by mouth at bedtime. Gummies    [provider]  diazepam (VALIUM) 2 MG tablet Take 1 tablet (2 mg total) by mouth every 6 (six) hours as needed (dizziness). 04/30/18   [name], MD  diphenhydrAMINE HCl, Sleep, 25 MG TBDP Take 12 mg by mouth at bedtime.    [provider]  diphenoxylate-atropine (LOMOTIL) 2.5-0.025 MG tablet Take 2 tablets by mouth 4 (four) times daily as needed for diarrhea or loose stools. 07/31/18   [name], MD  ENBREL SURECLICK 50 MG/ML injection Inject 50 mg into the skin once a week.  12/05/12   [provider]  ezetimibe (ZETIA) 10 MG tablet Take 1 tablet (10 mg total) by mouth daily. 06/08/18   Dorothy Spark, MD  glucose blood (ONE TOUCH ULTRA TEST) test strip Use test strip to check blood sugar at least 3 times 05/01/17   [name], MD  halobetasol (ULTRAVATE) 0.05 % cream Apply 1 application topically daily. 10/24/16   [provider]  Lancets (ONETOUCH ULTRASOFT) lancets Use lancet to test blood sugar at least twice a day or as directed by your physician. 05/01/17   [name], MD  loratadine (CLARITIN REDITABS) 10 MG dissolvable tablet Take 10 mg by mouth as needed for allergies.     [provider]  nebivolol (BYSTOLIC) 2.5 MG tablet Take 1 tablet (2.5 mg total) by mouth daily. 04/30/18   [name], MD  olmesartan (BENICAR) 20 MG tablet Take 1 tablet (20 mg total) by mouth 2 (two) times daily. 05/01/18   [name], MD  omeprazole (PRILOSEC) 20 MG  capsule Take 20 mg by mouth daily.     [provider]  ondansetron (ZOFRAN) 4 MG tablet Take 4 mg by mouth every 8 (eight) hours as needed for nausea or vomiting.    [provider]  oxymetazoline (AFRIN 12 HOUR) 0.05 % nasal spray Place 1 spray into the nose 3 (three) times daily as needed for congestion.     [provider]  Pediatric Multiple Vit-C-FA (MULTIVITAMIN ANIMAL SHAPES, WITH CA/FA,) with C &  FA chewable tablet Chew 1 tablet by mouth at bedtime.     [provider]  Pitavastatin Calcium 1 MG TABS Take 1 tablet (1 mg total) by mouth 3 (three) times a week. 06/08/18   Dorothy Spark, MD  rivaroxaban (XARELTO) 20 MG TABS tablet Take 1 tablet (20 mg total) by mouth daily with supper. OK to restart this medication on 03/15/2018. 03/16/18   Donita Brooks, NP    Physical Exam: Vitals:   08/01/18 1555 08/01/18 1556 08/01/18 1900 08/01/18 1915  BP: (!) 92/58  (!) 97/59 111/62  Pulse: 72  76 76  Resp: 16     Temp: 98.5 F (36.9 C)     TempSrc: Oral     SpO2: 98%  100% 98%  Weight:  64.4 kg    Height:  '5\' 1"'  (1.549 m)        Constitutional: NAD, calm, acutely ill looking Vitals:   08/01/18 1555 08/01/18 1556 08/01/18 1900 08/01/18 1915  BP: (!) 92/58  (!) 97/59 111/62  Pulse: 72  76 76  Resp: 16     Temp: 98.5 F (36.9 C)     TempSrc: Oral     SpO2: 98%  100% 98%  Weight:  64.4 kg    Height:  '5\' 1"'  (1.549 m)     Eyes: PERRL, lids and conjunctivae normal ENMT: Mucous membranes are dry posterior pharynx clear of any exudate or lesions.Normal dentition.  Neck: normal, supple, no masses, no thyromegaly Respiratory: clear to auscultation bilaterally, no wheezing, no crackles. Normal respiratory effort. No accessory muscle use.  Cardiovascular: Regular rate and rhythm, no murmurs / rubs / gallops. No extremity edema. 2+ pedal pulses. No carotid bruits.  Abdomen: Distended, diffusely tender, no masses palpated. No hepatosplenomegaly. Bowel  sounds positive.  Musculoskeletal: no clubbing / cyanosis. No joint deformity upper and lower extremities. Good ROM, no contractures. Normal muscle tone.  Skin: no rashes, lesions, ulcers. No induration Neurologic: CN 2-12 grossly intact. Sensation intact, DTR normal. Strength 5/5 in all 4.  Psychiatric: Normal judgment and insight. Alert and oriented x 3. Normal mood.     Labs on Admission: I have personally reviewed following labs and imaging studies  CBC: Recent Labs  Lab 07/27/18 1006 07/31/18 1102 08/01/18 1600  WBC 8.0 6.0 7.8  NEUTROABS  --  4.0  --   HGB 12.7 12.9 12.7  HCT 39.0 39.7 40.6  MCV 96.5 96.8 100.0  PLT 287 276.0 150   Basic Metabolic Panel: Recent Labs  Lab 07/27/18 1006 07/31/18 1102 08/01/18 1600 08/01/18 1655  NA 141 140 139  --   K 4.2 3.4* 3.4*  --   CL 107 108 113*  --   CO2 24 23 17*  --   GLUCOSE 121* 95 108*  --   BUN '9 9 9  ' --   CREATININE 1.00 1.01 1.70*  --   CALCIUM 9.3 8.5 8.8*  --   MG  --   --   --  1.0*   GFR: Estimated Creatinine Clearance: 24.9 mL/min (A) (by C-G formula based on SCr of 1.7 mg/dL (H)). Liver Function Tests: Recent Labs  Lab 07/27/18 1006 07/31/18 1102 08/01/18 1600  AST '23 21 26  ' ALT 27 25 33  ALKPHOS 61 56 63  BILITOT 1.1 1.0 1.5*  PROT 7.3 6.6 6.7  ALBUMIN 4.0 3.9 3.6   Recent Labs  Lab 07/27/18 1006 08/01/18 1600  LIPASE 27 32   No results for input(s):  AMMONIA in the last 168 hours. Coagulation Profile: No results for input(s): INR, PROTIME in the last 168 hours. Cardiac Enzymes: No results for input(s): CKTOTAL, CKMB, CKMBINDEX, TROPONINI in the last 168 hours. BNP (last 3 results) No results for input(s): PROBNP in the last 8760 hours. HbA1C: No results for input(s): HGBA1C in the last 72 hours. CBG: No results for input(s): GLUCAP in the last 168 hours. Lipid Profile: No results for input(s): CHOL, HDL, LDLCALC, TRIG, CHOLHDL, LDLDIRECT in the last 72 hours. Thyroid Function  Tests: No results for input(s): TSH, T4TOTAL, FREET4, T3FREE, THYROIDAB in the last 72 hours. Anemia Panel: No results for input(s): VITAMINB12, FOLATE, FERRITIN, TIBC, IRON, RETICCTPCT in the last 72 hours. Urine analysis:    Component Value Date/Time   COLORURINE AMBER (A) 08/01/2018 1850   APPEARANCEUR CLOUDY (A) 08/01/2018 1850   LABSPEC 1.020 08/01/2018 1850   PHURINE 5.0 08/01/2018 1850   GLUCOSEU NEGATIVE 08/01/2018 1850   GLUCOSEU NEGATIVE 10/13/2009 0843   HGBUR NEGATIVE 08/01/2018 1850   BILIRUBINUR NEGATIVE 08/01/2018 1850   KETONESUR 5 (A) 08/01/2018 1850   PROTEINUR 100 (A) 08/01/2018 1850   UROBILINOGEN 0.2 10/13/2009 0843   NITRITE NEGATIVE 08/01/2018 1850   LEUKOCYTESUR TRACE (A) 08/01/2018 1850   Sepsis Labs: '@LABRCNTIP' (procalcitonin:4,lacticidven:4) ) Recent Results (from the past 240 hour(s))  Stool Giardia/Cryptosporidium     Status: None (In process)   Collection Time: 07/31/18  4:55 PM   Specimen: Stool  Result Value Ref Range Status   MICRO NUMBER: [id number]  Final   SPECIMEN QUALITY: Adequate  Final   Source: STOOL  Final   STATUS: FINAL  Final   RESULT: Not Detected  Final   COMMENT:   Final    NOTE: Due to intermittent shedding, one negative sample does not necessarily rule out the presence of a parasitic infection.     Radiological Exams on Admission: Ct Abdomen Pelvis Wo Contrast  Result Date: 08/01/2018 CLINICAL DATA:  Nausea for a week, diarrhea EXAM: CT ABDOMEN AND PELVIS WITHOUT CONTRAST TECHNIQUE: Multidetector CT imaging of the abdomen and pelvis was performed following the standard protocol without IV contrast. COMPARISON:  07/27/2018 FINDINGS: Lower chest: Stable low-attenuation 2 cm nodular density in the lingula likely reflecting a mucocele. Right middle lobe nodularity again noted. 13 mm known right lower lobe nodule. Hepatobiliary: No focal liver abnormality is seen. Hepatic steatosis. No gallstones, gallbladder wall thickening, or  biliary dilatation. High density material within the gallbladder likely reflecting contrast from recent IV contrast administration. Pancreas: Unremarkable. No pancreatic ductal dilatation or surrounding inflammatory changes. Spleen: Normal in size without focal abnormality. Adrenals/Urinary Tract: Normal adrenal glands. 2.7 cm hypodense, fluid attenuating right renal mass consistent with a cyst. No urolithiasis or obstructive uropathy. Normal decompressed bladder. Stomach/Bowel: Small hiatal hernia. Stomach is within normal limits. No evidence of bowel wall thickening, distention, or inflammatory changes. Multiple fluid-filled loops of small as can be seen with mild enteritis. Vascular/Lymphatic: Normal caliber abdominal aorta with mild atherosclerosis. No lymphadenopathy. Reproductive: Uterus and bilateral adnexa are unremarkable. Other: No abdominal wall hernia or abnormality. No abdominopelvic ascites. Musculoskeletal: Thoracolumbar spine spondylosis. No acute osseous abnormality. No aggressive osseous lesion. Levoscoliosis of the lumbar spine. IMPRESSION: 1. Multiple fluid-filled loops of small as can be seen with mild enteritis. 2.  Aortic Atherosclerosis (ICD10-I70.0). 3. 13 mm known right lower lobe nodule. As per the prior PET, chest CT follow-up is recommended. 4. Hepatic steatosis. 5. Right middle lobe and lingular low density nodules likely reflecting mucoceles. Electronically  Signed   By: Kathreen Devoid   On: 08/01/2018 18:26      Assessment/Plan Principal Problem:   Acute ischemic enteritis (HCC) Active Problems:   Diabetes type 2, controlled (HCC)   Dyslipidemia   HTN (hypertension)   GERD (gastroesophageal reflux disease)   Vertigo   Diastolic dysfunction   Pulmonary embolism (HCC)   Syncope   Hypokalemia     #1 acute enteritis: Most likely secondary to infection.  C. difficile is high on the list but patient has not had any recent exposure to antibiotics.  Her last exposure was in  December.  She could still be a carrier.  Other bacterial and nonbacterial pathogens are possible.  Stool studies is pending.  Initiate Cipro and Flagyl as well as IV fluids.  Supportive care in general.  #2 syncope: Most likely due to orthostasis and dehydration.  Aggressively being hydrated.  Blood pressure is responding to fluids.  Continue Ringer's lactate at 125.  #3 diabetes: Initiate sliding scale insulin with home regimen.  #4 hypertension: Hold antihypertensives now due to low blood pressure.  Resume when stable.  #5 history of pulmonary embolism: On chronic Xarelto.  Continue treatment  #6 hypokalemia: Most likely secondary to diarrhea and vomiting.  Replete potassium.  #7 history history of vertigo: No dizziness at the moment.  Symptoms seem to be more due to dehydration.  Continue to monitor  #8 hyperlipidemia: Resume home regimen when stable.  #9 coronary artery disease: Stable.  No chest pain.  No evidence of worsening symptoms.  #10 GERD: Continue PPIs.   DVT prophylaxis: Xarelto Code Status: Full code Family Communication: Discussed care with the patient no family at bedside Disposition Plan: To be determined Consults called: None Admission status: Inpatient  Severity of Illness: The appropriate patient status for this patient is INPATIENT. Inpatient status is judged to be reasonable and necessary in order to provide the required intensity of service to ensure the patient's safety. The patient's presenting symptoms, physical exam findings, and initial radiographic and laboratory data in the context of their chronic comorbidities is felt to place them at high risk for further clinical deterioration. Furthermore, it is not anticipated that the patient will be medically stable for discharge from the hospital within 2 midnights of admission. The following factors support the patient status of inpatient.   " The patient's presenting symptoms include nausea vomiting and  diarrhea. " The worrisome physical exam findings include generalized dehydration with orthostasis. " The initial radiographic and laboratory data are worrisome because of acute enteritis on CT scan. " The chronic co-morbidities include pulmonary embolism with coronary artery disease.   * I certify that at the point of admission it is my clinical judgment that the patient will require inpatient hospital care spanning beyond 2 midnights from the point of admission due to high intensity of service, high risk for further deterioration and high frequency of surveillance required.Barbette Merino MD [facility] Pager [phone]  If 7PM-7AM, please contact night-coverage [email or web address] Password [facility]  08/01/2018, 7:36 PM

## 2018-08-01 NOTE — ED Notes (Signed)
Pt to ct 

## 2018-08-01 NOTE — Progress Notes (Signed)
Called and received report from ED RN.

## 2018-08-01 NOTE — ED Notes (Signed)
Admitting doctor at the bedside 

## 2018-08-01 NOTE — ED Notes (Signed)
Unsuccessful attempt to call report  Was told bed just assigned  23 minutes on our clock

## 2018-08-02 LAB — GASTROINTESTINAL PANEL BY PCR, STOOL (REPLACES STOOL CULTURE)

## 2018-08-02 LAB — GLUCOSE, CAPILLARY
Glucose-Capillary: 75 mg/dL (ref 70–99)
Glucose-Capillary: 87 mg/dL (ref 70–99)
Glucose-Capillary: 73 mg/dL (ref 70–99)
Glucose-Capillary: 79 mg/dL (ref 70–99)

## 2018-08-02 LAB — CBC
HCT: 34.4 % — ABNORMAL LOW (ref 36.0–46.0)
Hemoglobin: 11.2 g/dL — ABNORMAL LOW (ref 12.0–15.0)
MCH: 31.7 pg (ref 26.0–34.0)
MCHC: 32.6 g/dL (ref 30.0–36.0)
MCV: 97.5 fL (ref 80.0–100.0)
Platelets: 226 10*3/uL (ref 150–400)
RBC: 3.53 MIL/uL — ABNORMAL LOW (ref 3.87–5.11)
RDW: 14 % (ref 11.5–15.5)
WBC: 5.6 10*3/uL (ref 4.0–10.5)
nRBC: 0 % (ref 0.0–0.2)

## 2018-08-02 LAB — COMPREHENSIVE METABOLIC PANEL
ALT: 28 U/L (ref 0–44)
AST: 19 U/L (ref 15–41)
Albumin: 3 g/dL — ABNORMAL LOW (ref 3.5–5.0)
Alkaline Phosphatase: 47 U/L (ref 38–126)
Anion gap: 9 (ref 5–15)
BUN: 9 mg/dL (ref 8–23)
CO2: 16 mmol/L — ABNORMAL LOW (ref 22–32)
Calcium: 8.4 mg/dL — ABNORMAL LOW (ref 8.9–10.3)
Chloride: 114 mmol/L — ABNORMAL HIGH (ref 98–111)
Creatinine, Ser: 1.59 mg/dL — ABNORMAL HIGH (ref 0.44–1.00)
GFR calc Af Amer: 37 mL/min — ABNORMAL LOW (ref >60)
GFR calc non Af Amer: 32 mL/min — ABNORMAL LOW (ref >60)
Glucose, Bld: 85 mg/dL (ref 70–99)
Potassium: 3.5 mmol/L (ref 3.5–5.1)
Sodium: 139 mmol/L (ref 135–145)
Total Bilirubin: 1.5 mg/dL — ABNORMAL HIGH (ref 0.3–1.2)
Total Protein: 5.6 g/dL — ABNORMAL LOW (ref 6.5–8.1)

## 2018-08-02 LAB — CLOSTRIDIUM DIFFICILE EIA: C difficile Toxins A+B, EIA: NEGATIVE

## 2018-08-02 MED ORDER — PROMETHAZINE HCL 25 MG/ML IJ SOLN
25.0000 mg | Freq: Four times a day (QID) | INTRAMUSCULAR | Status: DC | PRN
  Administered 2018-08-02: 25 mg via INTRAVENOUS
  Filled 2018-08-02: qty 1

## 2018-08-02 MED ORDER — SACCHAROMYCES BOULARDII 250 MG PO CAPS
500.0000 mg | ORAL_CAPSULE | Freq: Two times a day (BID) | ORAL | Status: DC
  Administered 2018-08-02 – 2018-08-04 (×4): 500 mg via ORAL
  Administered 2018-08-04: 250 mg via ORAL
  Administered 2018-08-05 – 2018-08-09 (×6): 500 mg via ORAL
  Filled 2018-08-02 (×16): qty 2

## 2018-08-02 MED ORDER — SODIUM CHLORIDE 0.9 % IV SOLN
INTRAVENOUS | Status: DC
  Administered 2018-08-03 – 2018-08-04 (×2): via INTRAVENOUS

## 2018-08-02 MED ORDER — GLUCERNA SHAKE PO LIQD
237.0000 mL | Freq: Three times a day (TID) | ORAL | Status: DC
  Administered 2018-08-02 – 2018-08-07 (×4): 237 mL via ORAL

## 2018-08-02 MED ORDER — POTASSIUM CHLORIDE IN NACL 20-0.9 MEQ/L-% IV SOLN
INTRAVENOUS | Status: DC
  Administered 2018-08-02 – 2018-08-10 (×14): via INTRAVENOUS
  Filled 2018-08-02 (×14): qty 1000

## 2018-08-02 NOTE — H&P (View-Only) (Signed)
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  Three weeks ago she started to have a worsening of her GERD and she tried to self-medicate with omeprazole, but it was not effective.  She started using omeprazole since the advent of the medication in 1990 and consistently eradicates all of her GERD symptoms, except for this time.  She feels that her GERD is uncontrollable, but she denies any problems with vomiting, hematemesis, or dysphagia.  Nausea is a significant problem for her.  One week prior to admission, last Thursday, she acutely started to have diarrhea.  The stools were soft at first, but as the week progressed her stools were clear an watery.  She denies any evidence of hematochezia or melena.  Her last colonoscopy with Dr. Collene Mares was in 2008 and two attempts to repeat the procedure in the years past failed.  She was not able to tolerate any prep.  Yesterday, she suffered an episode of syncope and this prompted her to seek medical attention.  In the hospital, her creatinine increased from a baseline of 0.9 up to 1.7 on admission.  Her electrolytes were stable and there was no leukocytosis.  On average she will have 8-10 watery bowel movements in a day and a hemoccult in the ER was positive for blood.  Her GI pathogen panel and C. Diff were negative for any overt infection.  Her abdominal CT was not revealing for any overt source of her symptoms.  Past Medical History:  Diagnosis Date  . ALLERGIC RHINITIS   . ANEMIA-NOS   . Arthritis   . ASTHMA   . Carpal tunnel syndrome   . COLONIC POLYPS, HX OF   . Coronary artery disease    "mild CAD" noted on 12/05/17 in coronary CT scan  . DIABETES MELLITUS, TYPE II    diet controlled  . GERD   .  HYPERLIPIDEMIA   . HYPERTENSION   . OSTEOPENIA   . PONV (postoperative nausea and vomiting)   . Psoriasis    severe, began soriatane 01/2012  . Rectal fissure   . Scoliosis     Past Surgical History:  Procedure Laterality Date  . benign rectal growth  2004   removed by Dr. Zella Richer  . BREAST BIOPSY Right   . BREAST EXCISIONAL BIOPSY Left   . BREAST SURGERY  1988   biopsy  . CESAREAN SECTION    . VIDEO BRONCHOSCOPY WITH ENDOBRONCHIAL NAVIGATION N/A 03/14/2018   Procedure: VIDEO BRONCHOSCOPY WITH ENDOBRONCHIAL NAVIGATION;  Surgeon: Collene Gobble, MD;  Location: [facility] OR;  Service: Thoracic;  Laterality: N/A;    Family History  Problem Relation Age of Onset  . Lung cancer Father   . Arthritis Other        Parents  . Asthma Other        parent, other relative  . Breast cancer Other        other relative  . Hypertension Other        parent, other relative  . Heart disease Other        parent, other relative  . Heart disease Mother   . Asthma Mother   . Breast cancer Maternal Aunt 68  . Parkinson's disease Maternal Grandmother   . Rheumatic fever Maternal  Grandfather     Social History:  reports that she quit smoking about 44 years ago. She has a 10.00 pack-year smoking history. She has never used smokeless tobacco. She reports that she does not drink alcohol or use drugs.  Allergies:  Allergies  Allergen Reactions  . Phenergan [Promethazine Hcl] Shortness Of Breath and Anxiety  . Amlodipine Swelling  . Clarithromycin Nausea Only    I can pass out   . Codeine Nausea Only    I can pass out   . Erythromycin     Other reaction(s): Vomiting (intolerance)  . Statins     Muscle pain   . Tetracycline     Medications:  Scheduled: . cholecalciferol  2,000 Units Oral QHS  . ezetimibe  10 mg Oral Daily  . feeding supplement (GLUCERNA SHAKE)  237 mL Oral TID BM  . insulin aspart  0-5 Units Subcutaneous QHS  . insulin aspart  0-9 Units Subcutaneous TID WC  . nebivolol   2.5 mg Oral Daily  . pantoprazole  40 mg Oral Daily  . pravastatin  20 mg Oral q1800  . rivaroxaban  20 mg Oral Q supper  . saccharomyces boulardii  500 mg Oral BID   Continuous: . 0.9 % NaCl with KCl 20 mEq / L 100 mL/hr at 08/02/18 1644  . ciprofloxacin 400 mg (08/01/18 2220)  . lactated ringers 125 mL/hr at 08/01/18 2217  . metronidazole 500 mg (08/02/18 1511)    Results for orders placed or performed during the hospital encounter of 08/01/18 (from the past 24 hour(s))  Urinalysis, Routine w reflex microscopic     Status: Abnormal   Collection Time: 08/01/18  6:50 PM  Result Value Ref Range   Color, Urine AMBER (A) YELLOW   APPearance CLOUDY (A) CLEAR   Specific Gravity, Urine 1.020 1.005 - 1.030   pH 5.0 5.0 - 8.0   Glucose, UA NEGATIVE NEGATIVE mg/dL   Hgb urine dipstick NEGATIVE NEGATIVE   Bilirubin Urine NEGATIVE NEGATIVE   Ketones, ur 5 (A) NEGATIVE mg/dL   Protein, ur 100 (A) NEGATIVE mg/dL   Nitrite NEGATIVE NEGATIVE   Leukocytes,Ua TRACE (A) NEGATIVE   RBC / HPF 0-5 0 - 5 RBC/hpf   WBC, UA 6-10 0 - 5 WBC/hpf   Bacteria, UA RARE (A) NONE SEEN   Squamous Epithelial / LPF 0-5 0 - 5   Mucus PRESENT    Hyaline Casts, UA PRESENT    Granular Casts, UA PRESENT    Non Squamous Epithelial 0-5 (A) NONE SEEN  Gastrointestinal Panel by PCR , Stool     Status: None   Collection Time: 08/01/18  6:50 PM   Specimen: Stool  Result Value Ref Range   Campylobacter species NOT DETECTED NOT DETECTED   Plesimonas shigelloides NOT DETECTED NOT DETECTED   Salmonella species NOT DETECTED NOT DETECTED   Yersinia enterocolitica NOT DETECTED NOT DETECTED   Vibrio species NOT DETECTED NOT DETECTED   Vibrio cholerae NOT DETECTED NOT DETECTED   Enteroaggregative E coli (EAEC) NOT DETECTED NOT DETECTED   Enteropathogenic E coli (EPEC) NOT DETECTED NOT DETECTED   Enterotoxigenic E coli (ETEC) NOT DETECTED NOT DETECTED   Shiga like toxin producing E coli (STEC) NOT DETECTED NOT DETECTED    Shigella/Enteroinvasive E coli (EIEC) NOT DETECTED NOT DETECTED   Cryptosporidium NOT DETECTED NOT DETECTED   Cyclospora cayetanensis NOT DETECTED NOT DETECTED   Entamoeba histolytica NOT DETECTED NOT DETECTED   Giardia lamblia NOT DETECTED NOT DETECTED  Adenovirus F40/41 NOT DETECTED NOT DETECTED   Astrovirus NOT DETECTED NOT DETECTED   Norovirus GI/GII NOT DETECTED NOT DETECTED   Rotavirus A NOT DETECTED NOT DETECTED   Sapovirus (I, II, IV, and V) NOT DETECTED NOT DETECTED  C Difficile Quick Screen w PCR reflex     Status: None   Collection Time: 08/01/18  6:50 PM   Specimen: Stool  Result Value Ref Range   C Diff antigen NEGATIVE NEGATIVE   C Diff toxin NEGATIVE NEGATIVE   C Diff interpretation No C. difficile detected.   POC occult blood, ED     Status: Abnormal   Collection Time: 08/01/18  7:07 PM  Result Value Ref Range   Fecal Occult Bld POSITIVE (A) NEGATIVE  Glucose, capillary     Status: None   Collection Time: 08/01/18  8:47 PM  Result Value Ref Range   Glucose-Capillary 74 70 - 99 mg/dL  Comprehensive metabolic panel     Status: Abnormal   Collection Time: 08/02/18  4:40 AM  Result Value Ref Range   Sodium 139 135 - 145 mmol/L   Potassium 3.5 3.5 - 5.1 mmol/L   Chloride 114 (H) 98 - 111 mmol/L   CO2 16 (L) 22 - 32 mmol/L   Glucose, Bld 85 70 - 99 mg/dL   BUN 9 8 - 23 mg/dL   Creatinine, Ser 1.59 (H) 0.44 - 1.00 mg/dL   Calcium 8.4 (L) 8.9 - 10.3 mg/dL   Total Protein 5.6 (L) 6.5 - 8.1 g/dL   Albumin 3.0 (L) 3.5 - 5.0 g/dL   AST 19 15 - 41 U/L   ALT 28 0 - 44 U/L   Alkaline Phosphatase 47 38 - 126 U/L   Total Bilirubin 1.5 (H) 0.3 - 1.2 mg/dL   GFR calc non Af Amer 32 (L) >60 mL/min   GFR calc Af Amer 37 (L) >60 mL/min   Anion gap 9 5 - 15  CBC     Status: Abnormal   Collection Time: 08/02/18  4:40 AM  Result Value Ref Range   WBC 5.6 4.0 - 10.5 K/uL   RBC 3.53 (L) 3.87 - 5.11 MIL/uL   Hemoglobin 11.2 (L) 12.0 - 15.0 g/dL   HCT 34.4 (L) 36.0 - 46.0 %    MCV 97.5 80.0 - 100.0 fL   MCH 31.7 26.0 - 34.0 pg   MCHC 32.6 30.0 - 36.0 g/dL   RDW 14.0 11.5 - 15.5 %   Platelets 226 150 - 400 K/uL   nRBC 0.0 0.0 - 0.2 %  Glucose, capillary     Status: None   Collection Time: 08/02/18  6:01 AM  Result Value Ref Range   Glucose-Capillary 75 70 - 99 mg/dL  Glucose, capillary     Status: None   Collection Time: 08/02/18 11:54 AM  Result Value Ref Range   Glucose-Capillary 87 70 - 99 mg/dL  Glucose, capillary     Status: None   Collection Time: 08/02/18  5:00 PM  Result Value Ref Range   Glucose-Capillary 73 70 - 99 mg/dL     Ct Abdomen Pelvis Wo Contrast  Result Date: 08/01/2018 CLINICAL DATA:  Nausea for a week, diarrhea EXAM: CT ABDOMEN AND PELVIS WITHOUT CONTRAST TECHNIQUE: Multidetector CT imaging of the abdomen and pelvis was performed following the standard protocol without IV contrast. COMPARISON:  07/27/2018 FINDINGS: Lower chest: Stable low-attenuation 2 cm nodular density in the lingula likely reflecting a mucocele. Right middle lobe nodularity again noted. [place]  mm known right lower lobe nodule. Hepatobiliary: No focal liver abnormality is seen. Hepatic steatosis. No gallstones, gallbladder wall thickening, or biliary dilatation. High density material within the gallbladder likely reflecting contrast from recent IV contrast administration. Pancreas: Unremarkable. No pancreatic ductal dilatation or surrounding inflammatory changes. Spleen: Normal in size without focal abnormality. Adrenals/Urinary Tract: Normal adrenal glands. 2.7 cm hypodense, fluid attenuating right renal mass consistent with a cyst. No urolithiasis or obstructive uropathy. Normal decompressed bladder. Stomach/Bowel: Small hiatal hernia. Stomach is within normal limits. No evidence of bowel wall thickening, distention, or inflammatory changes. Multiple fluid-filled loops of small as can be seen with mild enteritis. Vascular/Lymphatic: Normal caliber abdominal aorta with mild  atherosclerosis. No lymphadenopathy. Reproductive: Uterus and bilateral adnexa are unremarkable. Other: No abdominal wall hernia or abnormality. No abdominopelvic ascites. Musculoskeletal: Thoracolumbar spine spondylosis. No acute osseous abnormality. No aggressive osseous lesion. Levoscoliosis of the lumbar spine. IMPRESSION: 1. Multiple fluid-filled loops of small as can be seen with mild enteritis. 2.  Aortic Atherosclerosis (ICD10-I70.0). 3. 13 mm known right lower lobe nodule. As per the prior PET, chest CT follow-up is recommended. 4. Hepatic steatosis. 5. Right middle lobe and lingular low density nodules likely reflecting mucoceles. Electronically Signed   By: Kathreen Devoid   On: 08/01/2018 18:26    ROS:  As stated above in the HPI otherwise negative.  Blood pressure 117/81, pulse (!) 58, temperature 97.9 F (36.6 C), temperature source Oral, resp. rate 16, height 5\' 1"  (1.549 m), weight 64 kg, SpO2 100 %.    PE: Gen: NAD, Alert and Oriented HEENT:  [place]/AT, EOMI Neck: Supple, no LAD Lungs: CTA Bilaterally CV: RRR without M/G/R ABM: Soft, mid abdominal tenderness - mild to moderate, no rebound, +BS Ext: No C/C/E  Assessment/Plan: 1) Diarrhea. 2) GERD. 3) Dehydration. 4) Syncope. 5) ABM pain.   Further evaluation with an EGD and colonoscopy will be performed tomorrow.  She is adamant that she cannot tolerate any prep.  With the voluminous diarrhea, an unprepped colonoscopy will be pursued.  The goal is to identify any evidence of overt mucosal inflammation.  Biopsies of the colonic mucosal will be obtained.  An esophagitis can cause all of these symptoms, including the diarrhea, however, the diarrhea is typically not this severe.    Plan: 1) EGD/Colonoscopy tomorrow. 2) The procedure can be performed on Xarelto, but she declines using the medication. 3) Continue with IV hydration.  Deshondra Worst D 08/02/2018, 5:13 PM

## 2018-08-02 NOTE — Progress Notes (Signed)
PROGRESS NOTE    [name]  QVZ:[id number] DOB: August 22, 1945 DOA: 08/01/2018 PCP: [name], MD    Brief Narrative:73 y.o. female with medical history significant of diabetes, hypertension, coronary artery disease, GERD, vertigo, scoliosis, hyperlipidemia, asthma and chronic anticoagulation with Xarelto who presented with 1 week of persistent diarrhea with some nausea.  Patient reported up to 8-10 stools a day.  Associated with persistent nausea and decreased oral intake.  She has been trying to keep up with fluids.  Denied any fever, denied any sick contact.  Denies eating outside the house.  No canned foods.  Patient has been trying to handle it at home not going out of her house at all.  Today however she was going to the bathroom when she felt dizzy weak and passed out.  She therefore decided to come to the ER.  She was found to be orthostatic here with signs of dehydration so patient is being admitted for work-up.  Patient reported seeing streaks of blood in her stool today..  ED Course: Initial temperature is 98.5 with blood pressure 92/58 pulse 76 respiratory rate of 16 oxygen sat 98% on room air.  Patient was also orthostatic.  Labs showed a white count 7.8 hemoglobin 12.7 and platelets of 266.  Sodium is 139 potassium 3.4 chloride 113.  Lactic acid is 1.2.  Magnesium 1.0.  Urinalysis not impressive. Fecal occult blood testing was positive x2.  CT abdomen pelvis showed acute enteritis and stool studies currently pending.  Patient is being admitted therefore for treatment and work-up.  Assessment & Plan:   Principal Problem:   Acute ischemic enteritis (HCC) Active Problems:   Diabetes type 2, controlled ([place])   Dyslipidemia   HTN (hypertension)   GERD (gastroesophageal reflux disease)   Vertigo   Diastolic dysfunction   Pulmonary embolism (HCC)   Syncope   Hypokalemia    #1 acute enteritis: Patient admitted with nausea vomiting diarrhea.  She started on Cipro and  Flagyl.  Stool studies including C. difficile and GI panel negative.  She continues to be nauseous and having 8-10 stools daily.  Lactoferrin positive.  She is still on IV fluids and unable to tolerate p.o. intake.  She continues with lightheadedness.  Discussed with GI who will see her in consult today.  Dr. Benson Norway to see her today.  Continue supportive care IV fluids Phenergan and Zofran probiotics.   FOBT positive x2 on Xarelto no evidence of active bleeding monitor closely.  Patient needs ongoing medical care due to persistent nausea and diarrhea unable to tolerate p.o. intake in spite of Phenergan and Zofran.  #2 syncope: Most likely due to orthostasis and dehydration.  Continue IV fluids.   #3 diabetes: Initiate sliding scale insulin with home regimen.  #4 hypertension: Hold antihypertensives now due to low blood pressure.  Resume when stable.  #5 history of pulmonary embolism: On chronic Xarelto.  Continue treatment  #6 hypokalemia: Most likely secondary to diarrhea and vomiting.  Replete potassium.  #7history of vertigo: No dizziness at the moment.  Symptoms seem to be more due to dehydration.  Continue to monitor  #8 hyperlipidemia: Resume home regimen when stable.  #9 coronary artery disease: Stable.  No chest pain.  No evidence of worsening symptoms.  #10 GERD: Continue PPIs.  #11 AKI due to ongoing gastroenteritis and dehydration continue IV fluids.   DVT prophylaxis: Xarelto Code Status: Full code Family Communication:None Disposition Plan: To be determined Consults called: gi    Nutrition Problem:  Inadequate oral intake Etiology: nausea, vomiting     Signs/Symptoms: per patient/family report    Interventions: Glucerna shake, Magic cup  Estimated body mass index is 26.68 kg/m as calculated from the following:   Height as of this encounter: 5\' 1"  (1.549 m).   Weight as of this encounter: 64 kg.  Subjective: Continues to feel lightheaded with nausea  and 8-10 loose liquid loose stools she describes loose stools as opening a tap of water  Objective: Vitals:   08/02/18 0013 08/02/18 0359 08/02/18 0845 08/02/18 1157  BP: 116/62 (!) 98/57 111/62 117/81  Pulse: 69 66 61 (!) 58  Resp: 18  15 16   Temp: 98 F (36.7 C) 98 F (36.7 C) 97.6 F (36.4 C) 97.9 F (36.6 C)  TempSrc:  Oral Oral Oral  SpO2: 96% 97% 99% 100%  Weight:      Height:        Intake/Output Summary (Last 24 hours) at 08/02/2018 1403 Last data filed at 08/01/2018 1957 Gross per 24 hour  Intake 1000 ml  Output -  Net 1000 ml   Filed Weights   08/01/18 1556 08/01/18 2040  Weight: 64.4 kg 64 kg    Examination:  General exam: Appears calm and comfortable oral mucosa dry Respiratory system: Clear to auscultation. Respiratory effort normal. Cardiovascular system: S1 & S2 heard, RRR. No JVD, murmurs, rubs, gallops or clicks. No pedal edema. Gastrointestinal system: Abdomen is nondistended, soft and tender to palpation no organomegaly or masses felt. Normal bowel sounds heard. Central nervous system: Alert and oriented. No focal neurological deficits. Extremities: Symmetric 5 x 5 power. Skin: No rashes, lesions or ulcers Psychiatry: Judgement and insight appear normal. Mood & affect appropriate.     Data Reviewed: I have personally reviewed following labs and imaging studies  CBC: Recent Labs  Lab 07/27/18 1006 07/31/18 1102 08/01/18 1600 08/02/18 0440  WBC 8.0 6.0 7.8 5.6  NEUTROABS  --  4.0  --   --   HGB 12.7 12.9 12.7 11.2*  HCT 39.0 39.7 40.6 34.4*  MCV 96.5 96.8 100.0 97.5  PLT 287 276.0 266 485   Basic Metabolic Panel: Recent Labs  Lab 07/27/18 1006 07/31/18 1102 08/01/18 1600 08/01/18 1655 08/02/18 0440  NA 141 140 139  --  139  K 4.2 3.4* 3.4*  --  3.5  CL 107 108 113*  --  114*  CO2 24 23 17*  --  16*  GLUCOSE 121* 95 108*  --  85  BUN 9 9 9   --  9  CREATININE 1.00 1.01 1.70*  --  1.59*  CALCIUM 9.3 8.5 8.8*  --  8.4*  MG  --    --   --  1.0*  --    GFR: Estimated Creatinine Clearance: 26.6 mL/min (A) (by C-G formula based on SCr of 1.59 mg/dL (H)). Liver Function Tests: Recent Labs  Lab 07/27/18 1006 07/31/18 1102 08/01/18 1600 08/02/18 0440  AST 23 21 26 19   ALT 27 25 33 28  ALKPHOS 61 56 63 47  BILITOT 1.1 1.0 1.5* 1.5*  PROT 7.3 6.6 6.7 5.6*  ALBUMIN 4.0 3.9 3.6 3.0*   Recent Labs  Lab 07/27/18 1006 08/01/18 1600  LIPASE 27 32   No results for input(s): AMMONIA in the last 168 hours. Coagulation Profile: No results for input(s): INR, PROTIME in the last 168 hours. Cardiac Enzymes: No results for input(s): CKTOTAL, CKMB, CKMBINDEX, TROPONINI in the last 168 hours. BNP (last 3 results) No  results for input(s): PROBNP in the last 8760 hours. HbA1C: No results for input(s): HGBA1C in the last 72 hours. CBG: Recent Labs  Lab 08/01/18 2047 08/02/18 0601 08/02/18 1154  GLUCAP 74 75 87   Lipid Profile: No results for input(s): CHOL, HDL, LDLCALC, TRIG, CHOLHDL, LDLDIRECT in the last 72 hours. Thyroid Function Tests: No results for input(s): TSH, T4TOTAL, FREET4, T3FREE, THYROIDAB in the last 72 hours. Anemia Panel: No results for input(s): VITAMINB12, FOLATE, FERRITIN, TIBC, IRON, RETICCTPCT in the last 72 hours. Sepsis Labs: Recent Labs  Lab 08/01/18 1637  LATICACIDVEN 1.2    Recent Results (from the past 240 hour(s))  Stool C-Diff Toxin Assay     Status: None   Collection Time: 07/31/18  4:55 PM   Specimen: Stool   STOOL  Result Value Ref Range Status   C difficile Toxins A+B, EIA Negative Negative Final  Stool Giardia/Cryptosporidium     Status: None (In process)   Collection Time: 07/31/18  4:55 PM   Specimen: Stool  Result Value Ref Range Status   MICRO NUMBER: [id number]  Final   SPECIMEN QUALITY: Adequate  Final   Source: STOOL  Final   STATUS: FINAL  Final   RESULT: Not Detected  Final   COMMENT:   Final    NOTE: Due to intermittent shedding, one negative sample does  not necessarily rule out the presence of a parasitic infection.  Stool Culture     Status: None (Preliminary result)   Collection Time: 07/31/18  4:55 PM   Specimen: Stool  Result Value Ref Range Status   MICRO NUMBER: [id number]  Final   SPECIMEN QUALITY: Adequate  Final   SOURCE: STOOL  Final   STATUS: FINAL  Final   SHIGA RESULT: Not Detected  Final   MICRO NUMBER: [id number]  Preliminary   SPECIMEN QUALITY: Adequate  Preliminary   Source STOOL  Preliminary   STATUS: PRELIMINARY  Preliminary   CAM RESULT: Culture in progress  Preliminary   MICRO NUMBER: [id number]  Final   SPECIMEN QUALITY: Adequate  Final   SOURCE: STOOL  Final   STATUS: FINAL  Final   SS RESULT: No Salmonella or Shigella isolated  Final  Gastrointestinal Panel by PCR , Stool     Status: None   Collection Time: 08/01/18  6:50 PM   Specimen: Stool  Result Value Ref Range Status   Campylobacter species NOT DETECTED NOT DETECTED Final   Plesimonas shigelloides NOT DETECTED NOT DETECTED Final   Salmonella species NOT DETECTED NOT DETECTED Final   Yersinia enterocolitica NOT DETECTED NOT DETECTED Final   Vibrio species NOT DETECTED NOT DETECTED Final   Vibrio cholerae NOT DETECTED NOT DETECTED Final   Enteroaggregative E coli (EAEC) NOT DETECTED NOT DETECTED Final   Enteropathogenic E coli (EPEC) NOT DETECTED NOT DETECTED Final   Enterotoxigenic E coli (ETEC) NOT DETECTED NOT DETECTED Final   Shiga like toxin producing E coli (STEC) NOT DETECTED NOT DETECTED Final   Shigella/Enteroinvasive E coli (EIEC) NOT DETECTED NOT DETECTED Final   Cryptosporidium NOT DETECTED NOT DETECTED Final   Cyclospora cayetanensis NOT DETECTED NOT DETECTED Final   Entamoeba histolytica NOT DETECTED NOT DETECTED Final   Giardia lamblia NOT DETECTED NOT DETECTED Final   Adenovirus F40/41 NOT DETECTED NOT DETECTED Final   Astrovirus NOT DETECTED NOT DETECTED Final   Norovirus GI/GII NOT DETECTED NOT DETECTED Final   Rotavirus A NOT  DETECTED NOT DETECTED Final   Sapovirus (I, II, IV, and V)  NOT DETECTED NOT DETECTED Final    Comment: Performed at [facility], [place], [place] [address]  C Difficile Quick Screen w PCR reflex     Status: None   Collection Time: 08/01/18  6:50 PM   Specimen: Stool  Result Value Ref Range Status   C Diff antigen NEGATIVE NEGATIVE Final   C Diff toxin NEGATIVE NEGATIVE Final   C Diff interpretation No C. difficile detected.  Final    Comment: Performed at [facility], [place] [address] [place],  [address]         Radiology Studies: Ct Abdomen Pelvis Wo Contrast  Result Date: 08/01/2018 CLINICAL DATA:  Nausea for a week, diarrhea EXAM: CT ABDOMEN AND PELVIS WITHOUT CONTRAST TECHNIQUE: Multidetector CT imaging of the abdomen and pelvis was performed following the standard protocol without IV contrast. COMPARISON:  07/27/2018 FINDINGS: Lower chest: Stable low-attenuation 2 cm nodular density in the lingula likely reflecting a mucocele. Right middle lobe nodularity again noted. 13 mm known right lower lobe nodule. Hepatobiliary: No focal liver abnormality is seen. Hepatic steatosis. No gallstones, gallbladder wall thickening, or biliary dilatation. High density material within the gallbladder likely reflecting contrast from recent IV contrast administration. Pancreas: Unremarkable. No pancreatic ductal dilatation or surrounding inflammatory changes. Spleen: Normal in size without focal abnormality. Adrenals/Urinary Tract: Normal adrenal glands. 2.7 cm hypodense, fluid attenuating right renal mass consistent with a cyst. No urolithiasis or obstructive uropathy. Normal decompressed bladder. Stomach/Bowel: Small hiatal hernia. Stomach is within normal limits. No evidence of bowel wall thickening, distention, or inflammatory changes. Multiple fluid-filled loops of small as can be seen with mild enteritis. Vascular/Lymphatic: Normal caliber abdominal aorta with mild  atherosclerosis. No lymphadenopathy. Reproductive: Uterus and bilateral adnexa are unremarkable. Other: No abdominal wall hernia or abnormality. No abdominopelvic ascites. Musculoskeletal: Thoracolumbar spine spondylosis. No acute osseous abnormality. No aggressive osseous lesion. Levoscoliosis of the lumbar spine. IMPRESSION: 1. Multiple fluid-filled loops of small as can be seen with mild enteritis. 2.  Aortic Atherosclerosis (ICD10-I70.0). 3. 13 mm known right lower lobe nodule. As per the prior PET, chest CT follow-up is recommended. 4. Hepatic steatosis. 5. Right middle lobe and lingular low density nodules likely reflecting mucoceles. Electronically Signed   By: Kathreen Devoid   On: 08/01/2018 18:26        Scheduled Meds: . cholecalciferol  2,000 Units Oral QHS  . ezetimibe  10 mg Oral Daily  . feeding supplement (GLUCERNA SHAKE)  237 mL Oral TID BM  . insulin aspart  0-5 Units Subcutaneous QHS  . insulin aspart  0-9 Units Subcutaneous TID WC  . nebivolol  2.5 mg Oral Daily  . pantoprazole  40 mg Oral Daily  . pravastatin  20 mg Oral q1800  . rivaroxaban  20 mg Oral Q supper  . saccharomyces boulardii  500 mg Oral BID   Continuous Infusions: . ciprofloxacin 400 mg (08/01/18 2220)  . lactated ringers 125 mL/hr at 08/01/18 2217  . metronidazole 500 mg (08/02/18 0556)     LOS: 1 day     Georgette Shell, MD [facility]  If 7PM-7AM, please contact night-coverage [email or web address] Password [facility] 08/02/2018, 2:03 PM

## 2018-08-02 NOTE — Progress Notes (Signed)
Pt reports feeling anxious after IV phenergen. Was very drowsy and felt she couldn't "take a breath". Respiration were even and unlabored at 18 with sats of 97%. Lungs CTA. Pt quickly drifted off to sleep. Upon waking pt states the phenergan made her feel "awful" but did allevaite nausea symptoms. Did report to MD. Updated as an intolerance.

## 2018-08-02 NOTE — Progress Notes (Signed)
Pt refused xarelto and pravastation doses. States "me and statins don't get along and I'm already a high risk for bleeding". Did get clearance from Dr. Benson Norway to give xarelto but pt still declines. Not given per pt request.

## 2018-08-02 NOTE — Progress Notes (Signed)
Initial Nutrition Assessment  RD working remotely.  DOCUMENTATION CODES:   Not applicable  INTERVENTION:   - d/c Ensure Enlive  - Glucerna Shake po TID, each supplement provides 220 kcal and 10 grams of protein  - Magic cup TID with meals, each supplement provides 290 kcal and 9 grams of protein  NUTRITION DIAGNOSIS:   Inadequate oral intake related to nausea, vomiting as evidenced by per patient/family report.  GOAL:   Patient will meet greater than or equal to 90% of their needs  MONITOR:   PO intake, Supplement acceptance, Labs, I & O's, Weight trends  REASON FOR ASSESSMENT:   Malnutrition Screening Tool    ASSESSMENT:   73 year old female who presented to the ED on 6/17 with nausea and diarrhea x 1 week. PMH of HLD, HTN, asthma, arthritis, T2DM, chronic rectal fissure, GERD, CAD. Pt admitted with acute enteritis.  Pt is C Diff negative.  Spoke with pt via phone call to room. Pt is somewhat confused and states "I'm in and out of it because of this nausea medication."  Pt states she has no appetite and that everything she eats "comes back up." Pt reports that for breakfast this morning, she was able to eat a few bites and pieces of pancakes. Pt reports that right now she is drinking some ginger ale to help settle her stomach. No meal completions recorded in pt's chart at this time.  Pt reports that over the last 2 days PTA, she has just been eating crackers (saltines or graham crackers) with peanut butter and mashed potatoes. Pt states she has not been eating true meals.  Pt states that she received [place] sausage on her breakfast tray which she does not eat. RD will add sausage as a dislike to pt's profile in [place].  When asked if she is willing to try an oral nutrition supplement, pt states something unintelligible about the RN then states "I just don't know about that." RD will order Glucerna Shake between meals and Magic Cup with meals to aid  pt in meeting kcal and protein needs as it appears that pt is tolerating liquids/very soft foods better than most solids.  Reviewed weight history in chart. Weight stable over the last year.  Pt states she feels weak and asks to rest. Unable to obtain further diet and weight history at this time.  Medications reviewed and include: Ensure Enlive BID, vitamin D3, SSI, Protonix, Florastor, IV abx IVF: LR @ 125 ml/hr  Labs reviewed. CBG's: 74-83 x 24 hours  NUTRITION - FOCUSED PHYSICAL EXAM:  Unable to complete at this time. RD working remotely.  Diet Order:   Diet Order            Diet Carb Modified Fluid consistency: Thin; Room service appropriate? Yes  Diet effective now              EDUCATION NEEDS:   No education needs have been identified at this time  Skin:  Skin Assessment: Reviewed RN Assessment  Last BM:  08/02/18 medium type 7  Height:   Ht Readings from Last 1 Encounters:  08/01/18 5\' 1"  (1.549 m)    Weight:   Wt Readings from Last 1 Encounters:  08/01/18 64 kg    Ideal Body Weight:  47.7 kg  BMI:  Body mass index is 26.68 kg/m.  Estimated Nutritional Needs:   Kcal:  1450-1650  Protein:  70-85 grams  Fluid:  >/= 1.5 L    Gaynell Face,  MS, RD, LDN Inpatient Clinical Dietitian Pager: [phone] Weekend/After Hours: [phone]

## 2018-08-02 NOTE — Consult Note (Signed)
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  Three weeks ago she started to have a worsening of her GERD and she tried to self-medicate with omeprazole, but it was not effective.  She started using omeprazole since the advent of the medication in 1990 and consistently eradicates all of her GERD symptoms, except for this time.  She feels that her GERD is uncontrollable, but she denies any problems with vomiting, hematemesis, or dysphagia.  Nausea is a significant problem for her.  One week prior to admission, last Thursday, she acutely started to have diarrhea.  The stools were soft at first, but as the week progressed her stools were clear an watery.  She denies any evidence of hematochezia or melena.  Her last colonoscopy with Dr. Collene Mares was in 2008 and two attempts to repeat the procedure in the years past failed.  She was not able to tolerate any prep.  Yesterday, she suffered an episode of syncope and this prompted her to seek medical attention.  In the hospital, her creatinine increased from a baseline of 0.9 up to 1.7 on admission.  Her electrolytes were stable and there was no leukocytosis.  On average she will have 8-10 watery bowel movements in a day and a hemoccult in the ER was positive for blood.  Her GI pathogen panel and C. Diff were negative for any overt infection.  Her abdominal CT was not revealing for any overt source of her symptoms.  Past Medical History:  Diagnosis Date  . ALLERGIC RHINITIS   . ANEMIA-NOS   . Arthritis   . ASTHMA   . Carpal tunnel syndrome   . COLONIC POLYPS, HX OF   . Coronary artery disease    "mild CAD" noted on 12/05/17 in coronary CT scan  . DIABETES MELLITUS, TYPE II    diet controlled  . GERD   .  HYPERLIPIDEMIA   . HYPERTENSION   . OSTEOPENIA   . PONV (postoperative nausea and vomiting)   . Psoriasis    severe, began soriatane 01/2012  . Rectal fissure   . Scoliosis     Past Surgical History:  Procedure Laterality Date  . benign rectal growth  2004   removed by Dr. Zella Richer  . BREAST BIOPSY Right   . BREAST EXCISIONAL BIOPSY Left   . BREAST SURGERY  1988   biopsy  . CESAREAN SECTION    . VIDEO BRONCHOSCOPY WITH ENDOBRONCHIAL NAVIGATION N/A 03/14/2018   Procedure: VIDEO BRONCHOSCOPY WITH ENDOBRONCHIAL NAVIGATION;  Surgeon: Collene Gobble, MD;  Location: [facility] OR;  Service: Thoracic;  Laterality: N/A;    Family History  Problem Relation Age of Onset  . Lung cancer Father   . Arthritis Other        Parents  . Asthma Other        parent, other relative  . Breast cancer Other        other relative  . Hypertension Other        parent, other relative  . Heart disease Other        parent, other relative  . Heart disease Mother   . Asthma Mother   . Breast cancer Maternal Aunt 68  . Parkinson's disease Maternal Grandmother   . Rheumatic fever Maternal  Grandfather     Social History:  reports that she quit smoking about 44 years ago. She has a 10.00 pack-year smoking history. She has never used smokeless tobacco. She reports that she does not drink alcohol or use drugs.  Allergies:  Allergies  Allergen Reactions  . Phenergan [Promethazine Hcl] Shortness Of Breath and Anxiety  . Amlodipine Swelling  . Clarithromycin Nausea Only    I can pass out   . Codeine Nausea Only    I can pass out   . Erythromycin     Other reaction(s): Vomiting (intolerance)  . Statins     Muscle pain   . Tetracycline     Medications:  Scheduled: . cholecalciferol  2,000 Units Oral QHS  . ezetimibe  10 mg Oral Daily  . feeding supplement (GLUCERNA SHAKE)  237 mL Oral TID BM  . insulin aspart  0-5 Units Subcutaneous QHS  . insulin aspart  0-9 Units Subcutaneous TID WC  . nebivolol   2.5 mg Oral Daily  . pantoprazole  40 mg Oral Daily  . pravastatin  20 mg Oral q1800  . rivaroxaban  20 mg Oral Q supper  . saccharomyces boulardii  500 mg Oral BID   Continuous: . 0.9 % NaCl with KCl 20 mEq / L 100 mL/hr at 08/02/18 1644  . ciprofloxacin 400 mg (08/01/18 2220)  . lactated ringers 125 mL/hr at 08/01/18 2217  . metronidazole 500 mg (08/02/18 1511)    Results for orders placed or performed during the hospital encounter of 08/01/18 (from the past 24 hour(s))  Urinalysis, Routine w reflex microscopic     Status: Abnormal   Collection Time: 08/01/18  6:50 PM  Result Value Ref Range   Color, Urine AMBER (A) YELLOW   APPearance CLOUDY (A) CLEAR   Specific Gravity, Urine 1.020 1.005 - 1.030   pH 5.0 5.0 - 8.0   Glucose, UA NEGATIVE NEGATIVE mg/dL   Hgb urine dipstick NEGATIVE NEGATIVE   Bilirubin Urine NEGATIVE NEGATIVE   Ketones, ur 5 (A) NEGATIVE mg/dL   Protein, ur 100 (A) NEGATIVE mg/dL   Nitrite NEGATIVE NEGATIVE   Leukocytes,Ua TRACE (A) NEGATIVE   RBC / HPF 0-5 0 - 5 RBC/hpf   WBC, UA 6-10 0 - 5 WBC/hpf   Bacteria, UA RARE (A) NONE SEEN   Squamous Epithelial / LPF 0-5 0 - 5   Mucus PRESENT    Hyaline Casts, UA PRESENT    Granular Casts, UA PRESENT    Non Squamous Epithelial 0-5 (A) NONE SEEN  Gastrointestinal Panel by PCR , Stool     Status: None   Collection Time: 08/01/18  6:50 PM   Specimen: Stool  Result Value Ref Range   Campylobacter species NOT DETECTED NOT DETECTED   Plesimonas shigelloides NOT DETECTED NOT DETECTED   Salmonella species NOT DETECTED NOT DETECTED   Yersinia enterocolitica NOT DETECTED NOT DETECTED   Vibrio species NOT DETECTED NOT DETECTED   Vibrio cholerae NOT DETECTED NOT DETECTED   Enteroaggregative E coli (EAEC) NOT DETECTED NOT DETECTED   Enteropathogenic E coli (EPEC) NOT DETECTED NOT DETECTED   Enterotoxigenic E coli (ETEC) NOT DETECTED NOT DETECTED   Shiga like toxin producing E coli (STEC) NOT DETECTED NOT DETECTED    Shigella/Enteroinvasive E coli (EIEC) NOT DETECTED NOT DETECTED   Cryptosporidium NOT DETECTED NOT DETECTED   Cyclospora cayetanensis NOT DETECTED NOT DETECTED   Entamoeba histolytica NOT DETECTED NOT DETECTED   Giardia lamblia NOT DETECTED NOT DETECTED  Adenovirus F40/41 NOT DETECTED NOT DETECTED   Astrovirus NOT DETECTED NOT DETECTED   Norovirus GI/GII NOT DETECTED NOT DETECTED   Rotavirus A NOT DETECTED NOT DETECTED   Sapovirus (I, II, IV, and V) NOT DETECTED NOT DETECTED  C Difficile Quick Screen w PCR reflex     Status: None   Collection Time: 08/01/18  6:50 PM   Specimen: Stool  Result Value Ref Range   C Diff antigen NEGATIVE NEGATIVE   C Diff toxin NEGATIVE NEGATIVE   C Diff interpretation No C. difficile detected.   POC occult blood, ED     Status: Abnormal   Collection Time: 08/01/18  7:07 PM  Result Value Ref Range   Fecal Occult Bld POSITIVE (A) NEGATIVE  Glucose, capillary     Status: None   Collection Time: 08/01/18  8:47 PM  Result Value Ref Range   Glucose-Capillary 74 70 - 99 mg/dL  Comprehensive metabolic panel     Status: Abnormal   Collection Time: 08/02/18  4:40 AM  Result Value Ref Range   Sodium 139 135 - 145 mmol/L   Potassium 3.5 3.5 - 5.1 mmol/L   Chloride 114 (H) 98 - 111 mmol/L   CO2 16 (L) 22 - 32 mmol/L   Glucose, Bld 85 70 - 99 mg/dL   BUN 9 8 - 23 mg/dL   Creatinine, Ser 1.59 (H) 0.44 - 1.00 mg/dL   Calcium 8.4 (L) 8.9 - 10.3 mg/dL   Total Protein 5.6 (L) 6.5 - 8.1 g/dL   Albumin 3.0 (L) 3.5 - 5.0 g/dL   AST 19 15 - 41 U/L   ALT 28 0 - 44 U/L   Alkaline Phosphatase 47 38 - 126 U/L   Total Bilirubin 1.5 (H) 0.3 - 1.2 mg/dL   GFR calc non Af Amer 32 (L) >60 mL/min   GFR calc Af Amer 37 (L) >60 mL/min   Anion gap 9 5 - 15  CBC     Status: Abnormal   Collection Time: 08/02/18  4:40 AM  Result Value Ref Range   WBC 5.6 4.0 - 10.5 K/uL   RBC 3.53 (L) 3.87 - 5.11 MIL/uL   Hemoglobin 11.2 (L) 12.0 - 15.0 g/dL   HCT 34.4 (L) 36.0 - 46.0 %    MCV 97.5 80.0 - 100.0 fL   MCH 31.7 26.0 - 34.0 pg   MCHC 32.6 30.0 - 36.0 g/dL   RDW 14.0 11.5 - 15.5 %   Platelets 226 150 - 400 K/uL   nRBC 0.0 0.0 - 0.2 %  Glucose, capillary     Status: None   Collection Time: 08/02/18  6:01 AM  Result Value Ref Range   Glucose-Capillary 75 70 - 99 mg/dL  Glucose, capillary     Status: None   Collection Time: 08/02/18 11:54 AM  Result Value Ref Range   Glucose-Capillary 87 70 - 99 mg/dL  Glucose, capillary     Status: None   Collection Time: 08/02/18  5:00 PM  Result Value Ref Range   Glucose-Capillary 73 70 - 99 mg/dL     Ct Abdomen Pelvis Wo Contrast  Result Date: 08/01/2018 CLINICAL DATA:  Nausea for a week, diarrhea EXAM: CT ABDOMEN AND PELVIS WITHOUT CONTRAST TECHNIQUE: Multidetector CT imaging of the abdomen and pelvis was performed following the standard protocol without IV contrast. COMPARISON:  07/27/2018 FINDINGS: Lower chest: Stable low-attenuation 2 cm nodular density in the lingula likely reflecting a mucocele. Right middle lobe nodularity again noted. [place]  mm known right lower lobe nodule. Hepatobiliary: No focal liver abnormality is seen. Hepatic steatosis. No gallstones, gallbladder wall thickening, or biliary dilatation. High density material within the gallbladder likely reflecting contrast from recent IV contrast administration. Pancreas: Unremarkable. No pancreatic ductal dilatation or surrounding inflammatory changes. Spleen: Normal in size without focal abnormality. Adrenals/Urinary Tract: Normal adrenal glands. 2.7 cm hypodense, fluid attenuating right renal mass consistent with a cyst. No urolithiasis or obstructive uropathy. Normal decompressed bladder. Stomach/Bowel: Small hiatal hernia. Stomach is within normal limits. No evidence of bowel wall thickening, distention, or inflammatory changes. Multiple fluid-filled loops of small as can be seen with mild enteritis. Vascular/Lymphatic: Normal caliber abdominal aorta with mild  atherosclerosis. No lymphadenopathy. Reproductive: Uterus and bilateral adnexa are unremarkable. Other: No abdominal wall hernia or abnormality. No abdominopelvic ascites. Musculoskeletal: Thoracolumbar spine spondylosis. No acute osseous abnormality. No aggressive osseous lesion. Levoscoliosis of the lumbar spine. IMPRESSION: 1. Multiple fluid-filled loops of small as can be seen with mild enteritis. 2.  Aortic Atherosclerosis (ICD10-I70.0). 3. 13 mm known right lower lobe nodule. As per the prior PET, chest CT follow-up is recommended. 4. Hepatic steatosis. 5. Right middle lobe and lingular low density nodules likely reflecting mucoceles. Electronically Signed   By: Kathreen Devoid   On: 08/01/2018 18:26    ROS:  As stated above in the HPI otherwise negative.  Blood pressure 117/81, pulse (!) 58, temperature 97.9 F (36.6 C), temperature source Oral, resp. rate 16, height 5\' 1"  (1.549 m), weight 64 kg, SpO2 100 %.    PE: Gen: NAD, Alert and Oriented HEENT:  [place]/AT, EOMI Neck: Supple, no LAD Lungs: CTA Bilaterally CV: RRR without M/G/R ABM: Soft, mid abdominal tenderness - mild to moderate, no rebound, +BS Ext: No C/C/E  Assessment/Plan: 1) Diarrhea. 2) GERD. 3) Dehydration. 4) Syncope. 5) ABM pain.   Further evaluation with an EGD and colonoscopy will be performed tomorrow.  She is adamant that she cannot tolerate any prep.  With the voluminous diarrhea, an unprepped colonoscopy will be pursued.  The goal is to identify any evidence of overt mucosal inflammation.  Biopsies of the colonic mucosal will be obtained.  An esophagitis can cause all of these symptoms, including the diarrhea, however, the diarrhea is typically not this severe.    Plan: 1) EGD/Colonoscopy tomorrow. 2) The procedure can be performed on Xarelto, but she declines using the medication. 3) Continue with IV hydration.  [name] 08/02/2018, 5:13 PM

## 2018-08-03 LAB — COMPREHENSIVE METABOLIC PANEL
ALT: 25 U/L (ref 0–44)
AST: 19 U/L (ref 15–41)
Albumin: 2.8 g/dL — ABNORMAL LOW (ref 3.5–5.0)
Alkaline Phosphatase: 46 U/L (ref 38–126)
Anion gap: 7 (ref 5–15)
BUN: 8 mg/dL (ref 8–23)
CO2: 15 mmol/L — ABNORMAL LOW (ref 22–32)
Calcium: 8.6 mg/dL — ABNORMAL LOW (ref 8.9–10.3)
Chloride: 121 mmol/L — ABNORMAL HIGH (ref 98–111)
Creatinine, Ser: 1.24 mg/dL — ABNORMAL HIGH (ref 0.44–1.00)
GFR calc Af Amer: 50 mL/min — ABNORMAL LOW (ref >60)
GFR calc non Af Amer: 43 mL/min — ABNORMAL LOW (ref >60)
Glucose, Bld: 92 mg/dL (ref 70–99)
Potassium: 3.6 mmol/L (ref 3.5–5.1)
Sodium: 143 mmol/L (ref 135–145)
Total Bilirubin: 1.2 mg/dL (ref 0.3–1.2)
Total Protein: 5.3 g/dL — ABNORMAL LOW (ref 6.5–8.1)

## 2018-08-03 LAB — URINALYSIS, ROUTINE W REFLEX MICROSCOPIC
Bacteria, UA: NONE SEEN
Bilirubin Urine: NEGATIVE
Glucose, UA: NEGATIVE mg/dL
Ketones, ur: 20 mg/dL — AB
Nitrite: NEGATIVE
Protein, ur: NEGATIVE mg/dL
Specific Gravity, Urine: 1.014 (ref 1.005–1.030)
pH: 5 (ref 5.0–8.0)

## 2018-08-03 LAB — CBC WITH DIFFERENTIAL/PLATELET
Abs Immature Granulocytes: 0.02 10*3/uL (ref 0.00–0.07)
Basophils Absolute: 0 10*3/uL (ref 0.0–0.1)
Basophils Relative: 1 %
Eosinophils Absolute: 0.1 10*3/uL (ref 0.0–0.5)
Eosinophils Relative: 2 %
HCT: 33.2 % — ABNORMAL LOW (ref 36.0–46.0)
Hemoglobin: 10.7 g/dL — ABNORMAL LOW (ref 12.0–15.0)
Immature Granulocytes: 0 %
Lymphocytes Relative: 29 %
Lymphs Abs: 1.9 10*3/uL (ref 0.7–4.0)
MCH: 31.5 pg (ref 26.0–34.0)
MCHC: 32.2 g/dL (ref 30.0–36.0)
MCV: 97.6 fL (ref 80.0–100.0)
Monocytes Absolute: 0.7 10*3/uL (ref 0.1–1.0)
Monocytes Relative: 11 %
Neutro Abs: 3.8 10*3/uL (ref 1.7–7.7)
Neutrophils Relative %: 57 %
Platelets: 241 10*3/uL (ref 150–400)
RBC: 3.4 MIL/uL — ABNORMAL LOW (ref 3.87–5.11)
RDW: 14.5 % (ref 11.5–15.5)
WBC: 6.5 10*3/uL (ref 4.0–10.5)
nRBC: 0 % (ref 0.0–0.2)

## 2018-08-03 LAB — GLUCOSE, CAPILLARY
Glucose-Capillary: 84 mg/dL (ref 70–99)
Glucose-Capillary: 84 mg/dL (ref 70–99)
Glucose-Capillary: 84 mg/dL (ref 70–99)
Glucose-Capillary: 74 mg/dL (ref 70–99)
Glucose-Capillary: 87 mg/dL (ref 70–99)

## 2018-08-03 LAB — NOVEL CORONAVIRUS, NAA (HOSP ORDER, SEND-OUT TO REF LAB; TAT 18-24 HRS): SARS-CoV-2, NAA: NOT DETECTED

## 2018-08-03 LAB — LIPASE, BLOOD: Lipase: 32 U/L (ref 11–51)

## 2018-08-03 LAB — MAGNESIUM: Magnesium: 1.2 mg/dL — ABNORMAL LOW (ref 1.7–2.4)

## 2018-08-03 MED ORDER — MAGNESIUM SULFATE 4 GM/100ML IV SOLN
4.0000 g | Freq: Once | INTRAVENOUS | Status: AC
  Administered 2018-08-03: 4 g via INTRAVENOUS
  Filled 2018-08-03: qty 100

## 2018-08-03 MED ORDER — POTASSIUM CHLORIDE 10 MEQ/100ML IV SOLN
10.0000 meq | INTRAVENOUS | Status: AC
  Administered 2018-08-03: 10 meq via INTRAVENOUS
  Filled 2018-08-03 (×2): qty 100

## 2018-08-03 MED ORDER — POTASSIUM CHLORIDE 10 MEQ/100ML IV SOLN
10.0000 meq | INTRAVENOUS | Status: AC
  Administered 2018-08-03: 10 meq via INTRAVENOUS

## 2018-08-03 MED ORDER — SCOPOLAMINE 1 MG/3DAYS TD PT72
1.0000 | MEDICATED_PATCH | TRANSDERMAL | Status: DC
  Administered 2018-08-03 – 2018-08-09 (×3): 1.5 mg via TRANSDERMAL
  Filled 2018-08-03 (×3): qty 1

## 2018-08-03 NOTE — Care Management Important Message (Signed)
Important Message  Patient Details  Name: [name] MRN: [id number] Date of Birth: 1945/05/10   Medicare Important Message Given:  Yes     [name] 08/03/2018, 11:58 AM

## 2018-08-03 NOTE — Progress Notes (Signed)
PROGRESS NOTE    CHRISTELL STEINMILLER  TKW:[id number] DOB: 10-29-1945 DOA: 08/01/2018 PCP: [name], MD   Brief Narrative: 73 y.o.femalewith medical history significant ofdiabetes, hypertension, coronary artery disease, GERD, vertigo, scoliosis, hyperlipidemia, asthma and chronic anticoagulation with Xarelto who presented with 1 week of persistent diarrhea with some nausea. Patient reported up to 8-10 stools a day. Associated with persistent nausea and decreased oral intake. She has been trying to keep up with fluids. Denied any fever, denied any sick contact. Denies eating outside the house. No canned foods. Patient has been trying to handle it at home not going out of her house at all. Today however she was going to the bathroom when she felt dizzy weak and passed out. She therefore decided to come to the ER. She was found to be orthostatic here with signs of dehydration so patient is being admitted for work-up. Patient reported seeing streaks of blood in her stool today..  ED Course:Initial temperature is 98.5 with blood pressure 92/58 pulse 76 respiratory rate of 16 oxygen sat 98% on room air. Patient was also orthostatic. Labs showed a white count 7.8 hemoglobin 12.7 and platelets of 266. Sodium is 139 potassium 3.4 chloride 113. Lactic acid is 1.2. Magnesium 1.0. Urinalysis not impressive. Fecal occult blood testing was positive x2.CT abdomen pelvis showed acute enteritis and stool studies currently pending. Patient is being admitted therefore for treatment and work-up Assessment & Plan:   Principal Problem:   Acute ischemic enteritis ([place]) Active Problems:   Diabetes type 2, controlled ([place])   Dyslipidemia   HTN (hypertension)   GERD (gastroesophageal reflux disease)   Vertigo   Diastolic dysfunction   Pulmonary embolism ([facility])   Syncope   Hypokalemia  #1 acute enteritis: Patient admitted with nausea vomiting diarrhea.  She started on Cipro and Flagyl.   Stool studies including C. difficile and GI panel negative.  She continues to be nauseous and having 8-10 stools daily.  Lactoferrin positive.  She is still on IV fluids and unable to tolerate p.o. intake.  She continues with lightheadedness.dr hung to do egd/colonoscopy today   FOBT positive x2 on Xarelto no evidence of active bleeding monitor closely.  Patient needs ongoing medical care due to persistent nausea and diarrhea unable to tolerate p.o. intake in spite of Phenergan and Zofran.dc phenergan it made her weak Scopolamine patch ordered  #2 syncope:Most likely due to orthostasis and dehydration.  Continue IV fluids.  #3 diabetes:Initiate sliding scale insulin with home regimen.  #4 hypertension:Hold antihypertensives now due to low blood pressure. Resume when stable.  #5 history of pulmonary embolism:On chronic Xarelto. Continue treatment  #6 hypokalemia/hypomagnasemia:Most likely secondary to diarrhea and vomiting. Replete potassium and mag.  #7history of vertigo:No dizziness at the moment. Symptoms seem to be more due to dehydration. Continue to monitor  #8 hyperlipidemia:Resume home regimen when stable.  #9 coronary artery disease:Stable. No chest pain. No evidence of worsening symptoms.  #10 GERD:Continue PPIs.  #11 AKI due to ongoing gastroenteritis and dehydration continue IV fluids  DVT prophylaxis:Xarelto Code Status:Full code Family Communication:None Disposition Plan:To be determined Consults called:gi      Nutrition Problem: Inadequate oral intake Etiology: nausea, vomiting     Signs/Symptoms: per patient/family report    Interventions: Glucerna shake, Magic cup  Estimated body mass index is 26.51 kg/m as calculated from the following:   Height as of this encounter: 5\' 1"  (1.549 m).   Weight as of this encounter: 63.6 kg.   Subjective: Continues to  have multiple episodes of diarrhea overnight with nausea unable to  keep even clear liquids down in spite of Zofran and Phenergan Phenergan made her very weak  Objective: Vitals:   08/02/18 1927 08/03/18 0333 08/03/18 0336 08/03/18 1215  BP: 117/68  123/62 132/65  Pulse: 65  (!) 58 (!) 58  Resp: 18  13   Temp: 98.2 F (36.8 C)  98 F (36.7 C) 98.2 F (36.8 C)  TempSrc: Oral  Oral Oral  SpO2: 98%  98% 98%  Weight:  63.6 kg    Height:        Intake/Output Summary (Last 24 hours) at 08/03/2018 1218 Last data filed at 08/03/2018 0600 Gross per 24 hour  Intake 3006.9 ml  Output -  Net 3006.9 ml   Filed Weights   08/01/18 1556 08/01/18 2040 08/03/18 0333  Weight: 64.4 kg 64 kg 63.6 kg    Examination:  General exam: Appears calm and comfortable  Respiratory system: Clear to auscultation. Respiratory effort normal. Cardiovascular system: S1 & S2 heard, RRR. No JVD, murmurs, rubs, gallops or clicks. No pedal edema. Gastrointestinal system: Abdomen is nondistended, soft and generalized tender. No organomegaly or masses felt. Normal bowel sounds heard. Central nervous system: Alert and oriented. No focal neurological deficits. Extremities: Symmetric 5 x 5 power. Skin: No rashes, lesions or ulcers Psychiatry: Judgement and insight appear normal. Mood & affect appropriate.     Data Reviewed: I have personally reviewed following labs and imaging studies  CBC: Recent Labs  Lab 07/31/18 1102 08/01/18 1600 08/02/18 0440 08/03/18 0638  WBC 6.0 7.8 5.6 6.5  NEUTROABS 4.0  --   --  3.8  HGB 12.9 12.7 11.2* 10.7*  HCT 39.7 40.6 34.4* 33.2*  MCV 96.8 100.0 97.5 97.6  PLT 276.0 266 226 989   Basic Metabolic Panel: Recent Labs  Lab 07/31/18 1102 08/01/18 1600 08/01/18 1655 08/02/18 0440 08/03/18 0638  NA 140 139  --  139 143  K 3.4* 3.4*  --  3.5 3.6  CL 108 113*  --  114* 121*  CO2 23 17*  --  16* 15*  GLUCOSE 95 108*  --  85 92  BUN 9 9  --  9 8  CREATININE 1.01 1.70*  --  1.59* 1.24*  CALCIUM 8.5 8.8*  --  8.4* 8.6*  MG  --   --   1.0*  --  1.2*   GFR: Estimated Creatinine Clearance: 34 mL/min (A) (by C-G formula based on SCr of 1.24 mg/dL (H)). Liver Function Tests: Recent Labs  Lab 07/31/18 1102 08/01/18 1600 08/02/18 0440 08/03/18 0638  AST 21 26 19 19   ALT 25 33 28 25  ALKPHOS 56 63 47 46  BILITOT 1.0 1.5* 1.5* 1.2  PROT 6.6 6.7 5.6* 5.3*  ALBUMIN 3.9 3.6 3.0* 2.8*   Recent Labs  Lab 08/01/18 1600  LIPASE 32   No results for input(s): AMMONIA in the last 168 hours. Coagulation Profile: No results for input(s): INR, PROTIME in the last 168 hours. Cardiac Enzymes: No results for input(s): CKTOTAL, CKMB, CKMBINDEX, TROPONINI in the last 168 hours. BNP (last 3 results) No results for input(s): PROBNP in the last 8760 hours. HbA1C: No results for input(s): HGBA1C in the last 72 hours. CBG: Recent Labs  Lab 08/02/18 1700 08/02/18 2058 08/03/18 0042 08/03/18 0635 08/03/18 1213  GLUCAP 73 79 84 84 84   Lipid Profile: No results for input(s): CHOL, HDL, LDLCALC, TRIG, CHOLHDL, LDLDIRECT in the last 72 hours.  Thyroid Function Tests: No results for input(s): TSH, T4TOTAL, FREET4, T3FREE, THYROIDAB in the last 72 hours. Anemia Panel: No results for input(s): VITAMINB12, FOLATE, FERRITIN, TIBC, IRON, RETICCTPCT in the last 72 hours. Sepsis Labs: Recent Labs  Lab 08/01/18 1637  LATICACIDVEN 1.2    Recent Results (from the past 240 hour(s))  Stool C-Diff Toxin Assay     Status: None   Collection Time: 07/31/18  4:55 PM   Specimen: Stool   STOOL  Result Value Ref Range Status   C difficile Toxins A+B, EIA Negative Negative Final  Stool Giardia/Cryptosporidium     Status: None (In process)   Collection Time: 07/31/18  4:55 PM   Specimen: Stool  Result Value Ref Range Status   MICRO NUMBER: [id number]  Final   SPECIMEN QUALITY: Adequate  Final   Source: STOOL  Final   STATUS: FINAL  Final   RESULT: Not Detected  Final   COMMENT:   Final    NOTE: Due to intermittent shedding, one negative  sample does not necessarily rule out the presence of a parasitic infection.  Stool Culture     Status: None (Preliminary result)   Collection Time: 07/31/18  4:55 PM   Specimen: Stool  Result Value Ref Range Status   MICRO NUMBER: [id number]  Final   SPECIMEN QUALITY: Adequate  Final   SOURCE: STOOL  Final   STATUS: FINAL  Final   SHIGA RESULT: Not Detected  Final   MICRO NUMBER: [id number]  Preliminary   SPECIMEN QUALITY: Adequate  Preliminary   Source STOOL  Preliminary   STATUS: PRELIMINARY  Preliminary   CAM RESULT: Culture in progress  Preliminary   MICRO NUMBER: [id number]  Final   SPECIMEN QUALITY: Adequate  Final   SOURCE: STOOL  Final   STATUS: FINAL  Final   SS RESULT: No Salmonella or Shigella isolated  Final  Gastrointestinal Panel by PCR , Stool     Status: None   Collection Time: 08/01/18  6:50 PM   Specimen: Stool  Result Value Ref Range Status   Campylobacter species NOT DETECTED NOT DETECTED Final   Plesimonas shigelloides NOT DETECTED NOT DETECTED Final   Salmonella species NOT DETECTED NOT DETECTED Final   Yersinia enterocolitica NOT DETECTED NOT DETECTED Final   Vibrio species NOT DETECTED NOT DETECTED Final   Vibrio cholerae NOT DETECTED NOT DETECTED Final   Enteroaggregative E coli (EAEC) NOT DETECTED NOT DETECTED Final   Enteropathogenic E coli (EPEC) NOT DETECTED NOT DETECTED Final   Enterotoxigenic E coli (ETEC) NOT DETECTED NOT DETECTED Final   Shiga like toxin producing E coli (STEC) NOT DETECTED NOT DETECTED Final   Shigella/Enteroinvasive E coli (EIEC) NOT DETECTED NOT DETECTED Final   Cryptosporidium NOT DETECTED NOT DETECTED Final   Cyclospora cayetanensis NOT DETECTED NOT DETECTED Final   Entamoeba histolytica NOT DETECTED NOT DETECTED Final   Giardia lamblia NOT DETECTED NOT DETECTED Final   Adenovirus F40/41 NOT DETECTED NOT DETECTED Final   Astrovirus NOT DETECTED NOT DETECTED Final   Norovirus GI/GII NOT DETECTED NOT DETECTED Final   Rotavirus A  NOT DETECTED NOT DETECTED Final   Sapovirus (I, II, IV, and V) NOT DETECTED NOT DETECTED Final    Comment: Performed at [facility], [address] [place], [place] [address]  C Difficile Quick Screen w PCR reflex     Status: None   Collection Time: 08/01/18  6:50 PM   Specimen: Stool  Result Value Ref Range Status  C Diff antigen NEGATIVE NEGATIVE Final   C Diff toxin NEGATIVE NEGATIVE Final   C Diff interpretation No C. difficile detected.  Final    Comment: Performed at [facility], [place] [address], [place], [place] [address]         Radiology Studies: Ct Abdomen Pelvis Wo Contrast  Result Date: 08/01/2018 CLINICAL DATA:  Nausea for a week, diarrhea EXAM: CT ABDOMEN AND PELVIS WITHOUT CONTRAST TECHNIQUE: Multidetector CT imaging of the abdomen and pelvis was performed following the standard protocol without IV contrast. COMPARISON:  07/27/2018 FINDINGS: Lower chest: Stable low-attenuation 2 cm nodular density in the lingula likely reflecting a mucocele. Right middle lobe nodularity again noted. 13 mm known right lower lobe nodule. Hepatobiliary: No focal liver abnormality is seen. Hepatic steatosis. No gallstones, gallbladder wall thickening, or biliary dilatation. High density material within the gallbladder likely reflecting contrast from recent IV contrast administration. Pancreas: Unremarkable. No pancreatic ductal dilatation or surrounding inflammatory changes. Spleen: Normal in size without focal abnormality. Adrenals/Urinary Tract: Normal adrenal glands. 2.7 cm hypodense, fluid attenuating right renal mass consistent with a cyst. No urolithiasis or obstructive uropathy. Normal decompressed bladder. Stomach/Bowel: Small hiatal hernia. Stomach is within normal limits. No evidence of bowel wall thickening, distention, or inflammatory changes. Multiple fluid-filled loops of small as can be seen with mild enteritis. Vascular/Lymphatic: Normal caliber abdominal aorta with  mild atherosclerosis. No lymphadenopathy. Reproductive: Uterus and bilateral adnexa are unremarkable. Other: No abdominal wall hernia or abnormality. No abdominopelvic ascites. Musculoskeletal: Thoracolumbar spine spondylosis. No acute osseous abnormality. No aggressive osseous lesion. Levoscoliosis of the lumbar spine. IMPRESSION: 1. Multiple fluid-filled loops of small as can be seen with mild enteritis. 2.  Aortic Atherosclerosis (ICD10-I70.0). 3. 13 mm known right lower lobe nodule. As per the prior PET, chest CT follow-up is recommended. 4. Hepatic steatosis. 5. Right middle lobe and lingular low density nodules likely reflecting mucoceles. Electronically Signed   By: Kathreen Devoid   On: 08/01/2018 18:26        Scheduled Meds: . cholecalciferol  2,000 Units Oral QHS  . ezetimibe  10 mg Oral Daily  . feeding supplement (GLUCERNA SHAKE)  237 mL Oral TID BM  . insulin aspart  0-5 Units Subcutaneous QHS  . insulin aspart  0-9 Units Subcutaneous TID WC  . nebivolol  2.5 mg Oral Daily  . pantoprazole  40 mg Oral Daily  . pravastatin  20 mg Oral q1800  . rivaroxaban  20 mg Oral Q supper  . saccharomyces boulardii  500 mg Oral BID  . scopolamine  1 patch Transdermal Q72H   Continuous Infusions: . sodium chloride 20 mL/hr at 08/03/18 0939  . 0.9 % NaCl with KCl 20 mEq / L 100 mL/hr at 08/03/18 6803  . ciprofloxacin Stopped (08/02/18 2345)  . lactated ringers 125 mL/hr at 08/01/18 2217  . metronidazole 500 mg (08/03/18 2122)     LOS: 2 days      Georgette Shell, MD [facility]  If 7PM-7AM, please contact night-coverage [email or web address] Password [facility] 08/03/2018, 12:18 PM

## 2018-08-03 NOTE — Progress Notes (Signed)
Subjective: No change with her symptoms.  Objective: Vital signs in last 24 hours: Temp:  [98 F (36.7 C)-98.2 F (36.8 C)] 98.2 F (36.8 C) (06/19 1215) Pulse Rate:  [58-65] 58 (06/19 1215) Resp:  [13-18] 13 (06/19 0336) BP: (117-132)/(62-68) 132/65 (06/19 1215) SpO2:  [98 %] 98 % (06/19 1215) Weight:  [63.6 kg] 63.6 kg (06/19 0333) Last BM Date: 08/02/18  Intake/Output from previous day: 06/18 0701 - 06/19 0700 In: 3006.9 [P.O.:222; I.V.:1934.2; IV Piggyback:850.7] Out: -  Intake/Output this shift: Total I/O In: 917.2 [I.V.:727.6; IV Piggyback:189.7] Out: -   General appearance: alert and no distress GI: mid to lower abdominal tenderness  Lab Results: Recent Labs    08/01/18 1600 08/02/18 0440 08/03/18 0638  WBC 7.8 5.6 6.5  HGB 12.7 11.2* 10.7*  HCT 40.6 34.4* 33.2*  PLT 266 226 241   BMET Recent Labs    08/01/18 1600 08/02/18 0440 08/03/18 0638  NA 139 139 143  K 3.4* 3.5 3.6  CL 113* 114* 121*  CO2 17* 16* 15*  GLUCOSE 108* 85 92  BUN 9 9 8   CREATININE 1.70* 1.59* 1.24*  CALCIUM 8.8* 8.4* 8.6*   LFT Recent Labs    08/03/18 0638  PROT 5.3*  ALBUMIN 2.8*  AST 19  ALT 25  ALKPHOS 46  BILITOT 1.2   PT/INR No results for input(s): LABPROT, INR in the last 72 hours. Hepatitis Panel No results for input(s): HEPBSAG, HCVAB, HEPAIGM, HEPBIGM in the last 72 hours. C-Diff Recent Labs    07/31/18 1655 08/01/18 1850  CDIFFTOX Negative NEGATIVE   Fecal Lactopherrin Recent Labs    07/31/18 1655  FECLLACTOFRN Positive*    Studies/Results: Ct Abdomen Pelvis Wo Contrast  Result Date: 08/01/2018 CLINICAL DATA:  Nausea for a week, diarrhea EXAM: CT ABDOMEN AND PELVIS WITHOUT CONTRAST TECHNIQUE: Multidetector CT imaging of the abdomen and pelvis was performed following the standard protocol without IV contrast. COMPARISON:  07/27/2018 FINDINGS: Lower chest: Stable low-attenuation 2 cm nodular density in the lingula likely reflecting a mucocele.  Right middle lobe nodularity again noted. 13 mm known right lower lobe nodule. Hepatobiliary: No focal liver abnormality is seen. Hepatic steatosis. No gallstones, gallbladder wall thickening, or biliary dilatation. High density material within the gallbladder likely reflecting contrast from recent IV contrast administration. Pancreas: Unremarkable. No pancreatic ductal dilatation or surrounding inflammatory changes. Spleen: Normal in size without focal abnormality. Adrenals/Urinary Tract: Normal adrenal glands. 2.7 cm hypodense, fluid attenuating right renal mass consistent with a cyst. No urolithiasis or obstructive uropathy. Normal decompressed bladder. Stomach/Bowel: Small hiatal hernia. Stomach is within normal limits. No evidence of bowel wall thickening, distention, or inflammatory changes. Multiple fluid-filled loops of small as can be seen with mild enteritis. Vascular/Lymphatic: Normal caliber abdominal aorta with mild atherosclerosis. No lymphadenopathy. Reproductive: Uterus and bilateral adnexa are unremarkable. Other: No abdominal wall hernia or abnormality. No abdominopelvic ascites. Musculoskeletal: Thoracolumbar spine spondylosis. No acute osseous abnormality. No aggressive osseous lesion. Levoscoliosis of the lumbar spine. IMPRESSION: 1. Multiple fluid-filled loops of small as can be seen with mild enteritis. 2.  Aortic Atherosclerosis (ICD10-I70.0). 3. 13 mm known right lower lobe nodule. As per the prior PET, chest CT follow-up is recommended. 4. Hepatic steatosis. 5. Right middle lobe and lingular low density nodules likely reflecting mucoceles. Electronically Signed   By: Kathreen Devoid   On: 08/01/2018 18:26    Medications:  Scheduled: . cholecalciferol  2,000 Units Oral QHS  . ezetimibe  10 mg Oral Daily  .  feeding supplement (GLUCERNA SHAKE)  237 mL Oral TID BM  . insulin aspart  0-5 Units Subcutaneous QHS  . insulin aspart  0-9 Units Subcutaneous TID WC  . nebivolol  2.5 mg Oral  Daily  . pantoprazole  40 mg Oral Daily  . pravastatin  20 mg Oral q1800  . rivaroxaban  20 mg Oral Q supper  . saccharomyces boulardii  500 mg Oral BID  . scopolamine  1 patch Transdermal Q72H   Continuous: . sodium chloride 20 mL/hr at 08/03/18 0939  . 0.9 % NaCl with KCl 20 mEq / L 100 mL/hr at 08/03/18 3545  . ciprofloxacin Stopped (08/02/18 2345)  . lactated ringers 125 mL/hr at 08/01/18 2217  . magnesium sulfate bolus IVPB 4 g (08/03/18 1538)  . metronidazole 500 mg (08/03/18 6256)    Assessment/Plan: 1) GERD. 2) Diarrhea.   The patient was not able to undergo the EGD/unprepped colonoscopy today.  Her Covid-19 send out test did not return.  The plan is to perform the tests tomorrow.  Plan: 1) Continue with supportive care. 2) EGD/FFS tomorrow with Dr. Fuller Plan.  LOS: 2 days   Gill Delrossi D 08/03/2018, 4:05 PM

## 2018-08-04 ENCOUNTER — Inpatient Hospital Stay (HOSPITAL_COMMUNITY): Payer: Medicare Other | Admitting: Certified Registered"

## 2018-08-04 ENCOUNTER — Encounter (HOSPITAL_COMMUNITY)
Admission: EM | Disposition: A | Discharge status: Home Health | Payer: Self-pay | Source: Home / Self Care | Attending: Internal Medicine

## 2018-08-04 ENCOUNTER — Encounter (HOSPITAL_COMMUNITY): Payer: Self-pay | Admitting: Gastroenterology

## 2018-08-04 HISTORY — PX: FLEXIBLE SIGMOIDOSCOPY: SHX5431

## 2018-08-04 HISTORY — PX: ESOPHAGOGASTRODUODENOSCOPY (EGD) WITH PROPOFOL: SHX5813

## 2018-08-04 LAB — GLUCOSE, CAPILLARY
Glucose-Capillary: 83 mg/dL (ref 70–99)
Glucose-Capillary: 76 mg/dL (ref 70–99)
Glucose-Capillary: 85 mg/dL (ref 70–99)
Glucose-Capillary: 74 mg/dL (ref 70–99)

## 2018-08-04 SURGERY — ESOPHAGOGASTRODUODENOSCOPY (EGD) WITH PROPOFOL
Anesthesia: Monitor Anesthesia Care

## 2018-08-04 MED ORDER — DIPHENOXYLATE-ATROPINE 2.5-0.025 MG PO TABS
2.0000 | ORAL_TABLET | Freq: Four times a day (QID) | ORAL | Status: DC | PRN
  Administered 2018-08-04 – 2018-08-05 (×4): 2 via ORAL
  Filled 2018-08-04 (×6): qty 2

## 2018-08-04 MED ORDER — ONDANSETRON HCL 4 MG/2ML IJ SOLN
INTRAMUSCULAR | Status: DC | PRN
  Administered 2018-08-04: 4 mg via INTRAVENOUS

## 2018-08-04 MED ORDER — FAMOTIDINE 20 MG PO TABS
40.0000 mg | ORAL_TABLET | Freq: Every day | ORAL | Status: DC
  Administered 2018-08-04 – 2018-08-09 (×6): 40 mg via ORAL
  Filled 2018-08-04 (×6): qty 2

## 2018-08-04 MED ORDER — PANTOPRAZOLE SODIUM 40 MG PO TBEC
40.0000 mg | DELAYED_RELEASE_TABLET | Freq: Two times a day (BID) | ORAL | Status: DC
  Administered 2018-08-04 – 2018-08-10 (×12): 40 mg via ORAL
  Filled 2018-08-04 (×14): qty 1

## 2018-08-04 MED ORDER — PROPOFOL 500 MG/50ML IV EMUL
INTRAVENOUS | Status: DC | PRN
  Administered 2018-08-04: 100 ug/kg/min via INTRAVENOUS

## 2018-08-04 MED ORDER — LIDOCAINE 2% (20 MG/ML) 5 ML SYRINGE
INTRAMUSCULAR | Status: DC | PRN
  Administered 2018-08-04: 40 mg via INTRAVENOUS

## 2018-08-04 MED ORDER — PROPOFOL 10 MG/ML IV BOLUS
INTRAVENOUS | Status: DC | PRN
  Administered 2018-08-04 (×2): 20 mg via INTRAVENOUS

## 2018-08-04 MED ORDER — MAGNESIUM SULFATE 4 GM/100ML IV SOLN
4.0000 g | Freq: Once | INTRAVENOUS | Status: AC
  Administered 2018-08-04: 4 g via INTRAVENOUS
  Filled 2018-08-04: qty 100

## 2018-08-04 MED ORDER — SCOPOLAMINE 1 MG/3DAYS TD PT72
MEDICATED_PATCH | TRANSDERMAL | Status: DC | PRN
  Administered 2018-08-04: 1 via TRANSDERMAL

## 2018-08-04 SURGICAL SUPPLY — 25 items

## 2018-08-04 NOTE — Anesthesia Preprocedure Evaluation (Signed)
Anesthesia Evaluation  Patient identified by MRN, date of birth, ID band Patient awake    Reviewed: Allergy & Precautions, H&P , NPO status , Patient's Chart, lab work & pertinent test results  History of Anesthesia Complications (+) PONV and history of anesthetic complications  Airway Mallampati: II   Neck ROM: full    Dental   Pulmonary asthma , former smoker,    breath sounds clear to auscultation       Cardiovascular hypertension, + CAD   Rhythm:regular Rate:Normal     Neuro/Psych  Neuromuscular disease    GI/Hepatic GERD  ,  Endo/Other  diabetes, Type 2  Renal/GU      Musculoskeletal  (+) Arthritis ,   Abdominal   Peds  Hematology  (+) Blood dyscrasia, anemia ,   Anesthesia Other Findings   Reproductive/Obstetrics                             Anesthesia Physical Anesthesia Plan  ASA: III  Anesthesia Plan: MAC   Post-op Pain Management:    Induction: Intravenous  PONV Risk Score and Plan: 3 and Propofol infusion and Treatment may vary due to age or medical condition  Airway Management Planned: Nasal Cannula  Additional Equipment:   Intra-op Plan:   Post-operative Plan:   Informed Consent: I have reviewed the patients History and Physical, chart, labs and discussed the procedure including the risks, benefits and alternatives for the proposed anesthesia with the patient or authorized representative who has indicated his/her understanding and acceptance.       Plan Discussed with: CRNA and Anesthesiologist  Anesthesia Plan Comments:         Anesthesia Quick Evaluation

## 2018-08-04 NOTE — Transfer of Care (Signed)
Immediate Anesthesia Transfer of Care Note  Patient: [name]  Procedure(s) Performed: ESOPHAGOGASTRODUODENOSCOPY (EGD) WITH PROPOFOL (N/A ) FLEXIBLE SIGMOIDOSCOPY (N/A )  Patient Location: Endoscopy Unit  Anesthesia Type:MAC  Level of Consciousness: awake  Airway & Oxygen Therapy: Patient Spontanous Breathing and Patient connected to nasal cannula oxygen  Post-op Assessment: Report given to RN and Post -op Vital signs reviewed and stable  Post vital signs: Reviewed and stable  Last Vitals:  Vitals Value Taken Time  BP    Temp    Pulse    Resp    SpO2      Last Pain:  Vitals:   08/04/18 0833  TempSrc: Oral  PainSc: 3       Patients Stated Pain Goal: 0 (45/99/77 [id number])  Complications: No apparent anesthesia complications

## 2018-08-04 NOTE — Interval H&P Note (Signed)
History and Physical Interval Note:  08/04/2018 9:06 AM  [name]  has presented today for surgery, with the diagnosis of Diarrhea and GERD.  The various methods of treatment have been discussed with the patient and family. After consideration of risks, benefits and other options for treatment, the patient has consented to  Procedure(s): ESOPHAGOGASTRODUODENOSCOPY (EGD) WITH PROPOFOL (N/A) FLEXIBLE SIGMOIDOSCOPY (N/A) as a surgical intervention.  The patient's history has been reviewed, patient examined, no change in status, stable for surgery.  I have reviewed the patient's chart and labs.  Questions were answered to the patient's satisfaction.     Pricilla Riffle. Fuller Plan

## 2018-08-04 NOTE — Progress Notes (Signed)
PROGRESS NOTE    [name]  YVO:[id number] DOB: [birth year] DOA: 08/01/2018 PCP: [name], MD    Brief Narrative: 73 y.o.femalewith medical history significant ofdiabetes, hypertension, coronary artery disease, GERD, vertigo, scoliosis, hyperlipidemia, asthma and chronic anticoagulation with Xarelto who presented with 1 week of persistent diarrhea with some nausea. Patient reported up to 8-10 stools a day. Associated with persistent nausea and decreased oral intake. She has been trying to keep up with fluids. Denied any fever, denied any sick contact. Denies eating outside the house. No canned foods. Patient has been trying to handle it at home not going out of her house at all. Today however she was going to the bathroom when she felt dizzy weak and passed out. She therefore decided to come to the ER. She was found to be orthostatic here with signs of dehydration so patient is being admitted for work-up. Patient reported seeing streaks of blood in her stool today..  ED Course:Initial temperature is 98.5 with blood pressure 92/58 pulse 76 respiratory rate of 16 oxygen sat 98% on room air. Patient was also orthostatic. Labs showed a white count 7.8 hemoglobin 12.7 and platelets of 266. Sodium is 139 potassium 3.4 chloride 113. Lactic acid is 1.2. Magnesium 1.0. Urinalysis not impressive. Fecal occult blood testing was positive x2.CT abdomen pelvis showed acute enteritis and stool studies currently pending. Patient is being admitted therefore for treatment and work-up  Assessment & Plan:   Principal Problem:   Acute ischemic enteritis ([place]) Active Problems:   Diabetes type 2, controlled ([place])   Dyslipidemia   HTN (hypertension)   GERD (gastroesophageal reflux disease)   Vertigo   Diastolic dysfunction   Pulmonary embolism ([facility])   Syncope   Hypokalemia   #1 acute enteritis:Patient admitted with nausea vomiting diarrhea. She started on Cipro and Flagyl.  Stool studies including C. difficile and GI panel negative. She continues to be nauseous and having 8-10 stools daily. Lactoferrin positive. She is still on IV fluids and unable to tolerate p.o. intake. She continues with lightheadedness.dr hung to do egd/colonoscopy today FOBT positive x2 on Xarelto no evidence of active bleeding monitor closely. Patient needs ongoing medical care due to persistent nausea and diarrhea unable to tolerate p.o. intake in spite of Phenergan and Zofran.dc phenergan it made her weak Scopolamine patch ordered S/p  flex sig-mild diverticulosis left colon,internal hemorrhoids S/p egd gastiris and polyps added pepcid ppi increased to bid not biopsied as she is still on xarelto  #2 syncope:Most likely due to orthostasis and dehydration.Continue IV fluids.  #3 diabetes:Initiate sliding scale insulin with home regimen.  #4 hypertension:Hold antihypertensives now due to low blood pressure. Resume when stable.  #5 history of pulmonary embolism:On chronic Xarelto. Continue treatment  #6 hypokalemia/hypomagnasemia:Most likely secondary to diarrhea and vomiting. Replete potassium and mag.  #7history of vertigo:No dizziness at the moment. Symptoms seem to be more due to dehydration. Continue to monitor  #8 hyperlipidemia:Resume home regimen when stable.  #9 coronary artery disease:Stable. No chest pain. No evidence of worsening symptoms.  #10 GERD:Continue PPIs.   #11 AKI due to ongoing gastroenteritis and dehydration continue IV fluids  DVT prophylaxis:Xarelto Code Status:Full code Family Communication:None Disposition Plan:To be determined Consults called:gi      Nutrition Problem: Inadequate oral intake Etiology: nausea, vomiting     Signs/Symptoms: per patient/family report    Interventions: Glucerna shake, Magic cup  Estimated body mass index is 26.91 kg/m as calculated from the following:   Height as of  this encounter: 5\' 1"  (1.549 m).   Weight as of this encounter: 64.6 kg.    Subjective:  Still having lots of diarrhea and nausea and vomited  Objective: Vitals:   08/04/18 0544 08/04/18 0833 08/04/18 0948 08/04/18 0958  BP: (!) 144/95 (!) 166/76  (!) 119/58  Pulse: (!) 56 62 67 66  Resp: 16 13 14 16   Temp: 98.1 F (36.7 C) 97.9 F (36.6 C) 97.7 F (36.5 C)   TempSrc: Oral Oral Temporal   SpO2: 97% 100% 96% 99%  Weight: 64.6 kg 64.6 kg    Height:  5\' 1"  (1.549 m)      Intake/Output Summary (Last 24 hours) at 08/04/2018 1042 Last data filed at 08/04/2018 0941 Gross per 24 hour  Intake 2264.58 ml  Output 1900 ml  Net 364.58 ml   Filed Weights   08/03/18 0333 08/04/18 0544 08/04/18 0833  Weight: 63.6 kg 64.6 kg 64.6 kg    Examination:  General exam: Appears calm and comfortable  Respiratory system: Clear to auscultation. Respiratory effort normal. Cardiovascular system: S1 & S2 heard, RRR. No JVD, murmurs, rubs, gallops or clicks. No pedal edema. Gastrointestinal system: Abdomen is nondistended, soft and generalized  tender. No organomegaly or masses felt. Normal bowel sounds heard. Central nervous system: Alert and oriented. No focal neurological deficits. Extremities: Symmetric 5 x 5 power. Skin: No rashes, lesions or ulcers Psychiatry: Judgement and insight appear normal. Mood & affect appropriate.     Data Reviewed: I have personally reviewed following labs and imaging studies  CBC: Recent Labs  Lab 07/31/18 1102 08/01/18 1600 08/02/18 0440 08/03/18 0638  WBC 6.0 7.8 5.6 6.5  NEUTROABS 4.0  --   --  3.8  HGB 12.9 12.7 11.2* 10.7*  HCT 39.7 40.6 34.4* 33.2*  MCV 96.8 100.0 97.5 97.6  PLT 276.0 266 226 892   Basic Metabolic Panel: Recent Labs  Lab 07/31/18 1102 08/01/18 1600 08/01/18 1655 08/02/18 0440 08/03/18 0638  NA 140 139  --  139 143  K 3.4* 3.4*  --  3.5 3.6  CL 108 113*  --  114* 121*  CO2 23 17*  --  16* 15*  GLUCOSE 95 108*  --  85  92  BUN 9 9  --  9 8  CREATININE 1.01 1.70*  --  1.59* 1.24*  CALCIUM 8.5 8.8*  --  8.4* 8.6*  MG  --   --  1.0*  --  1.2*   GFR: Estimated Creatinine Clearance: 34.2 mL/min (A) (by C-G formula based on SCr of 1.24 mg/dL (H)). Liver Function Tests: Recent Labs  Lab 07/31/18 1102 08/01/18 1600 08/02/18 0440 08/03/18 0638  AST 21 26 19 19   ALT 25 33 28 25  ALKPHOS 56 63 47 46  BILITOT 1.0 1.5* 1.5* 1.2  PROT 6.6 6.7 5.6* 5.3*  ALBUMIN 3.9 3.6 3.0* 2.8*   Recent Labs  Lab 08/01/18 1600 08/03/18 0714  LIPASE 32 32   No results for input(s): AMMONIA in the last 168 hours. Coagulation Profile: No results for input(s): INR, PROTIME in the last 168 hours. Cardiac Enzymes: No results for input(s): CKTOTAL, CKMB, CKMBINDEX, TROPONINI in the last 168 hours. BNP (last 3 results) No results for input(s): PROBNP in the last 8760 hours. HbA1C: No results for input(s): HGBA1C in the last 72 hours. CBG: Recent Labs  Lab 08/03/18 0635 08/03/18 1213 08/03/18 1626 08/03/18 2109 08/04/18 0550  GLUCAP 84 84 74 87 83   Lipid Profile: No results  for input(s): CHOL, HDL, LDLCALC, TRIG, CHOLHDL, LDLDIRECT in the last 72 hours. Thyroid Function Tests: No results for input(s): TSH, T4TOTAL, FREET4, T3FREE, THYROIDAB in the last 72 hours. Anemia Panel: No results for input(s): VITAMINB12, FOLATE, FERRITIN, TIBC, IRON, RETICCTPCT in the last 72 hours. Sepsis Labs: Recent Labs  Lab 08/01/18 1637  LATICACIDVEN 1.2    Recent Results (from the past 240 hour(s))  Stool C-Diff Toxin Assay     Status: None   Collection Time: 07/31/18  4:55 PM   Specimen: Stool   STOOL  Result Value Ref Range Status   C difficile Toxins A+B, EIA Negative Negative Final  Stool Giardia/Cryptosporidium     Status: None (In process)   Collection Time: 07/31/18  4:55 PM   Specimen: Stool  Result Value Ref Range Status   MICRO NUMBER: [id number]  Final   SPECIMEN QUALITY: Adequate  Final   Source: STOOL   Final   STATUS: FINAL  Final   RESULT: Not Detected  Final   COMMENT:   Final    NOTE: Due to intermittent shedding, one negative sample does not necessarily rule out the presence of a parasitic infection.  Stool Culture     Status: None (Preliminary result)   Collection Time: 07/31/18  4:55 PM   Specimen: Stool  Result Value Ref Range Status   MICRO NUMBER: [id number]  Final   SPECIMEN QUALITY: Adequate  Final   SOURCE: STOOL  Final   STATUS: FINAL  Final   SHIGA RESULT: Not Detected  Final   MICRO NUMBER: [id number]  Preliminary   SPECIMEN QUALITY: Adequate  Preliminary   Source STOOL  Preliminary   STATUS: PRELIMINARY  Preliminary   CAM RESULT: Culture in progress  Preliminary   MICRO NUMBER: [id number]  Final   SPECIMEN QUALITY: Adequate  Final   SOURCE: STOOL  Final   STATUS: FINAL  Final   SS RESULT: No Salmonella or Shigella isolated  Final  Gastrointestinal Panel by PCR , Stool     Status: None   Collection Time: 08/01/18  6:50 PM   Specimen: Stool  Result Value Ref Range Status   Campylobacter species NOT DETECTED NOT DETECTED Final   Plesimonas shigelloides NOT DETECTED NOT DETECTED Final   Salmonella species NOT DETECTED NOT DETECTED Final   Yersinia enterocolitica NOT DETECTED NOT DETECTED Final   Vibrio species NOT DETECTED NOT DETECTED Final   Vibrio cholerae NOT DETECTED NOT DETECTED Final   Enteroaggregative E coli (EAEC) NOT DETECTED NOT DETECTED Final   Enteropathogenic E coli (EPEC) NOT DETECTED NOT DETECTED Final   Enterotoxigenic E coli (ETEC) NOT DETECTED NOT DETECTED Final   Shiga like toxin producing E coli (STEC) NOT DETECTED NOT DETECTED Final   Shigella/Enteroinvasive E coli (EIEC) NOT DETECTED NOT DETECTED Final   Cryptosporidium NOT DETECTED NOT DETECTED Final   Cyclospora cayetanensis NOT DETECTED NOT DETECTED Final   Entamoeba histolytica NOT DETECTED NOT DETECTED Final   Giardia lamblia NOT DETECTED NOT DETECTED Final   Adenovirus F40/41 NOT  DETECTED NOT DETECTED Final   Astrovirus NOT DETECTED NOT DETECTED Final   Norovirus GI/GII NOT DETECTED NOT DETECTED Final   Rotavirus A NOT DETECTED NOT DETECTED Final   Sapovirus (I, II, IV, and V) NOT DETECTED NOT DETECTED Final    Comment: Performed at [facility], [address], [place], [place] [address]  C Difficile Quick Screen w PCR reflex     Status: None   Collection Time: 08/01/18  6:50  PM   Specimen: Stool  Result Value Ref Range Status   C Diff antigen NEGATIVE NEGATIVE Final   C Diff toxin NEGATIVE NEGATIVE Final   C Diff interpretation No C. difficile detected.  Final    Comment: Performed at [facility], [place] [address], [place], [place] [address]  Novel Coronavirus,NAA,(SEND-OUT TO REF LAB - TAT 24-48 hrs); Hosp Order     Status: None   Collection Time: 08/01/18  7:35 PM   Specimen: Nasopharyngeal Swab; Respiratory  Result Value Ref Range Status   SARS-CoV-2, NAA NOT DETECTED NOT DETECTED Final    Comment: (NOTE) This test was developed and its performance characteristics determined by [employer]. This test has not been FDA cleared or approved. This test has been authorized by FDA under an Emergency Use Authorization (EUA). This test is only authorized for the duration of time the declaration that circumstances exist justifying the authorization of the emergency use of in vitro diagnostic tests for detection of SARS-CoV-2 virus and/or diagnosis of COVID-19 infection under section 564(b)(1) of the Act, 21 U.S.C. 979YIA-1(K)(5), unless the authorization is terminated or revoked sooner. When diagnostic testing is negative, the possibility of a false negative result should be considered in the context of a patient's recent exposures and the presence of clinical signs and symptoms consistent with COVID-19. An individual without symptoms of COVID-19 and who is not shedding SARS-CoV-2 virus would expect to have a negative (not detected) result  in this assay. Performed  At: [facility] [address] [place], [place] [id number] Rush Farmer MD EM:[id number]    [place]  Final    Comment: Performed at [facility], [place] [address] [place], [place] [address]         Radiology Studies: No results found.      Scheduled Meds: . cholecalciferol  2,000 Units Oral QHS  . ezetimibe  10 mg Oral Daily  . famotidine  40 mg Oral QHS  . feeding supplement (GLUCERNA SHAKE)  237 mL Oral TID BM  . insulin aspart  0-5 Units Subcutaneous QHS  . insulin aspart  0-9 Units Subcutaneous TID WC  . nebivolol  2.5 mg Oral Daily  . pantoprazole  40 mg Oral BID AC  . pravastatin  20 mg Oral q1800  . rivaroxaban  20 mg Oral Q supper  . saccharomyces boulardii  500 mg Oral BID  . scopolamine  1 patch Transdermal Q72H   Continuous Infusions: . 0.9 % NaCl with KCl 20 mEq / L 100 mL/hr at 08/04/18 0049  . ciprofloxacin Stopped (08/03/18 2250)  . lactated ringers 125 mL/hr at 08/01/18 2217  . metronidazole 500 mg (08/04/18 0029)     LOS: 3 days     Georgette Shell, MD [facility] If 7PM-7AM, please contact night-coverage [email or web address] Password [facility] 08/04/2018, 10:42 AM

## 2018-08-04 NOTE — Op Note (Signed)
[facility] Patient Name: [name] Procedure Date : 08/04/2018 MRN: [id number] Attending MD: Ladene Artist , MD Date of Birth: 11-21-1945 CSN: [id number] Age: 73 Admit Type: Inpatient Procedure:                Upper GI endoscopy Indications:              Epigastric abdominal pain, Gastroesophageal reflux                            disease Providers:                Pricilla Riffle. Fuller Plan, MD, Glori Bickers, RN, Laverda Sorenson, Technician, Clearnce Sorrel, CRNA Referring MD:             Carol Ada, MD Medicines:                Propofol per Anesthesia Complications:            No immediate complications. Estimated Blood Loss:     Estimated blood loss: none. Procedure:                Pre-Anesthesia Assessment:                           - Prior to the procedure, a History and Physical                            was performed, and patient medications and                            allergies were reviewed. The patient's tolerance of                            previous anesthesia was also reviewed. The risks                            and benefits of the procedure and the sedation                            options and risks were discussed with the patient.                            All questions were answered, and informed consent                            was obtained. Prior Anticoagulants: The patient has                            taken Xarelto (rivaroxaban), last dose was 1 day                            prior to procedure. ASA Grade Assessment: III - A  patient with severe systemic disease. After                            reviewing the risks and benefits, the patient was                            deemed in satisfactory condition to undergo the                            procedure.                           After obtaining informed consent, the endoscope was                            passed under direct vision.  Throughout the                            procedure, the patient's blood pressure, pulse, and                            oxygen saturations were monitored continuously. The                            [id number]-H190 ([id number]) Olympus gastroscope was                            introduced through the mouth, and advanced to the                            second part of duodenum. The upper GI endoscopy was                            accomplished without difficulty. The patient                            tolerated the procedure well. No biospies were                            obtained as patient is anticoagulated. Scope In: Scope Out: Findings:      The examined esophagus was normal.      Patchy mild inflammation characterized by erythema and granularity was       found in the gastric body.      Multiple 3 to 5 mm sessile polyps with no bleeding and no stigmata of       recent bleeding were found in the gastric body, appearance typcial of       benign fundic gland polyps.      A small hiatal hernia was present.      The exam of the stomach was otherwise normal.      A single 4 mm sessile polyp with no bleeding was found in the second       portion of the duodenum.      The exam of the duodenum was otherwise normal. Impression:               -  Normal esophagus.                           - Gastritis.                           - Multiple gastric polyps.                           - Small hiatal hernia.                           - A single duodenal polyp.                           - No specimens collected. Recommendation:           - Return patient to hospital ward for ongoing care.                           - Resume previous diet.                           - Continue present medications.                           - Increase Protonix to 40 mg po bid taken ac                           - Add famotidine 40 mg po hs                           - Dr. Benson Norway to consider repeat EGD off anticoagulant                             to biopsies duodenal polyp, gastric polyps,                            gastritis Procedure Code(s):        --- Professional ---                           [phone], Esophagogastroduodenoscopy, flexible,                            transoral; diagnostic, including collection of                            specimen(s) by brushing or washing, when performed                            (separate procedure) Diagnosis Code(s):        --- Professional ---                           K29.70, Gastritis, unspecified, without bleeding  K31.7, Polyp of stomach and duodenum                           K44.9, Diaphragmatic hernia without obstruction or                            gangrene                           R10.13, Epigastric pain                           K21.9, Gastro-esophageal reflux disease without                            esophagitis CPT copyright 2019 American Medical Association. All rights reserved. The codes documented in this report are preliminary and upon coder review may  be revised to meet current compliance requirements. Ladene Artist, MD 08/04/2018 9:55:02 AM This report has been signed electronically. Number of Addenda: 0

## 2018-08-04 NOTE — Op Note (Signed)
[facility] Patient Name: [name] Procedure Date : 08/04/2018 MRN: [id number] Attending MD: [name] , MD Date of Birth: 1945/03/23 CSN: [id number] Age: 73 Admit Type: Inpatient Procedure:                Flexible Sigmoidoscopy Indications:              Gastrointestinal occult blood loss, Diarrhea Providers:                Caitlin Riffle. Fuller Plan, MD, Caitlin Bickers, RN, [name], Technician Referring MD:             Caitlin Ada, MD Medicines:                Monitored Anesthesia Care Complications:            No immediate complications. Estimated Blood Loss:     Estimated blood loss: none. Procedure:                Pre-Anesthesia Assessment:                           - Prior to the procedure, a History and Physical                            was performed, and patient medications and                            allergies were reviewed. The patient's tolerance of                            previous anesthesia was also reviewed. The risks                            and benefits of the procedure and the sedation                            options and risks were discussed with the patient.                            All questions were answered, and informed consent                            was obtained. Prior Anticoagulants: The patient has                            taken Xarelto (rivaroxaban), last dose was 1 day                            prior to procedure. ASA Grade Assessment: III - A                            patient with severe systemic disease. After  reviewing the risks and benefits, the patient was                            deemed in satisfactory condition to undergo the                            procedure.                           After obtaining informed consent, the scope was                            passed under direct vision. The [id number]-H190DL                            ([id number]) Olympus  pediatric colonscope was                            introduced through the anus and advanced to the the                            descending colon. The flexible sigmoidoscopy was                            accomplished without difficulty. The patient                            tolerated the procedure well. Scope In: 9:19:59 AM Scope Out: 9:29:06 AM Total Procedure Duration: 0 hours 9 minutes 7 seconds  Findings:      Skin tags were found on perianal exam.      Scattered small-mouthed diverticula were found in the left colon. There       was no evidence of diverticular bleeding.      Internal hemorrhoids were found during retroflexion. The hemorrhoids       were small and Grade I (internal hemorrhoids that do not prolapse).      The exam was otherwise without abnormality. No biopsies performed with       patient on Xarleto. Impression:               - Perianal skin tags found on perianal exam.                           - Mild diverticulosis in the left colon.                           - Internal hemorrhoids.                           - The examination was otherwise normal to the                            descending colon.                           - No specimens collected. Recommendation:           -  Return patient to hospital ward for ongoing care.                           - Resume previous diet today.                           - Futher plans per Dr. Benson Rios. Procedure Code(s):        --- Professional ---                           [phone], Sigmoidoscopy, flexible; diagnostic,                            including collection of specimen(s) by brushing or                            washing, when performed (separate procedure) Diagnosis Code(s):        --- Professional ---                           K64.0, First degree hemorrhoids                           K64.4, Residual hemorrhoidal skin tags                           R19.5, Other fecal abnormalities                           R19.7,  Diarrhea, unspecified                           K57.30, Diverticulosis of large intestine without                            perforation or abscess without bleeding CPT copyright 2019 American Medical Association. All rights reserved. The codes documented in this report are preliminary and upon coder review may  be revised to meet current compliance requirements. Caitlin Artist, MD 08/04/2018 9:34:44 AM This report has been signed electronically. Number of Addenda: 0

## 2018-08-05 ENCOUNTER — Encounter (HOSPITAL_COMMUNITY): Payer: Self-pay | Admitting: Gastroenterology

## 2018-08-05 DIAGNOSIS — R1013 Epigastric pain: Secondary | ICD-10-CM

## 2018-08-05 DIAGNOSIS — K219 Gastro-esophageal reflux disease without esophagitis: Secondary | ICD-10-CM

## 2018-08-05 DIAGNOSIS — R197 Diarrhea, unspecified: Secondary | ICD-10-CM

## 2018-08-05 LAB — GLUCOSE, CAPILLARY
Glucose-Capillary: 76 mg/dL (ref 70–99)
Glucose-Capillary: 69 mg/dL — ABNORMAL LOW (ref 70–99)
Glucose-Capillary: 76 mg/dL (ref 70–99)
Glucose-Capillary: 102 mg/dL — ABNORMAL HIGH (ref 70–99)
Glucose-Capillary: 70 mg/dL (ref 70–99)

## 2018-08-05 LAB — BASIC METABOLIC PANEL
Anion gap: 10 (ref 5–15)
BUN: 6 mg/dL — ABNORMAL LOW (ref 8–23)
CO2: 10 mmol/L — ABNORMAL LOW (ref 22–32)
Calcium: 8.1 mg/dL — ABNORMAL LOW (ref 8.9–10.3)
Chloride: 120 mmol/L — ABNORMAL HIGH (ref 98–111)
Creatinine, Ser: 1.09 mg/dL — ABNORMAL HIGH (ref 0.44–1.00)
GFR calc Af Amer: 58 mL/min — ABNORMAL LOW (ref >60)
GFR calc non Af Amer: 50 mL/min — ABNORMAL LOW (ref >60)
Glucose, Bld: 73 mg/dL (ref 70–99)
Potassium: 4.9 mmol/L (ref 3.5–5.1)
Sodium: 140 mmol/L (ref 135–145)

## 2018-08-05 LAB — CBC WITH DIFFERENTIAL/PLATELET
Abs Immature Granulocytes: 0.05 10*3/uL (ref 0.00–0.07)
Basophils Absolute: 0 10*3/uL (ref 0.0–0.1)
Basophils Relative: 1 %
Eosinophils Absolute: 0.4 10*3/uL (ref 0.0–0.5)
Eosinophils Relative: 4 %
HCT: 33.2 % — ABNORMAL LOW (ref 36.0–46.0)
Hemoglobin: 10.5 g/dL — ABNORMAL LOW (ref 12.0–15.0)
Immature Granulocytes: 1 %
Lymphocytes Relative: 19 %
Lymphs Abs: 1.6 10*3/uL (ref 0.7–4.0)
MCH: 32 pg (ref 26.0–34.0)
MCHC: 31.6 g/dL (ref 30.0–36.0)
MCV: 101.2 fL — ABNORMAL HIGH (ref 80.0–100.0)
Monocytes Absolute: 0.8 10*3/uL (ref 0.1–1.0)
Monocytes Relative: 10 %
Neutro Abs: 5.4 10*3/uL (ref 1.7–7.7)
Neutrophils Relative %: 65 %
Platelets: 224 10*3/uL (ref 150–400)
RBC: 3.28 MIL/uL — ABNORMAL LOW (ref 3.87–5.11)
RDW: 14.7 % (ref 11.5–15.5)
WBC: 8.2 10*3/uL (ref 4.0–10.5)
nRBC: 0 % (ref 0.0–0.2)

## 2018-08-05 LAB — MAGNESIUM: Magnesium: 2 mg/dL (ref 1.7–2.4)

## 2018-08-05 MED ORDER — DICYCLOMINE HCL 10 MG PO CAPS
10.0000 mg | ORAL_CAPSULE | Freq: Three times a day (TID) | ORAL | Status: DC
  Administered 2018-08-05 – 2018-08-10 (×19): 10 mg via ORAL
  Filled 2018-08-05 (×20): qty 1

## 2018-08-05 MED ORDER — SODIUM CHLORIDE 0.9 % IV SOLN
INTRAVENOUS | Status: DC | PRN
  Administered 2018-08-05 (×2): 1000 mL via INTRAVENOUS

## 2018-08-05 MED ORDER — CIPROFLOXACIN IN D5W 400 MG/200ML IV SOLN
400.0000 mg | Freq: Two times a day (BID) | INTRAVENOUS | Status: DC
  Administered 2018-08-05 – 2018-08-06 (×2): 400 mg via INTRAVENOUS
  Filled 2018-08-05 (×2): qty 200

## 2018-08-05 MED ORDER — ONDANSETRON HCL 4 MG PO TABS
4.0000 mg | ORAL_TABLET | Freq: Three times a day (TID) | ORAL | Status: DC
  Administered 2018-08-05 – 2018-08-10 (×16): 4 mg via ORAL
  Filled 2018-08-05 (×16): qty 1

## 2018-08-05 NOTE — Progress Notes (Signed)
PHARMACY NOTE:  ANTIMICROBIAL RENAL DOSAGE ADJUSTMENT  Current antimicrobial regimen includes a mismatch between antimicrobial dosage and estimated renal function.  As per policy approved by the Pharmacy & Therapeutics and Medical Executive Committees, the antimicrobial dosage will be adjusted accordingly.  Current antimicrobial dosage:   - Ciprofloxacin 400mg  IV Q24hrs  - Metronidazole 500mg  IV Q8hrs  Indication: Acute enteritis   Renal Function:  Estimated Creatinine Clearance: 39.2 mL/min (A) (by C-G formula based on SCr of 1.09 mg/dL (H)).    Antimicrobial dosage has been changed to:   - Ciprofloxacin 400mg  IV Q12hrs  - Continue metronidazole at current dose   Additional comments: Will continue to follow renal function and LOT   Thank you for allowing pharmacy to be a part of this patient's care.  Juanell Fairly, PharmD PGY1 Pharmacy Resident 08/05/2018 1:31 PM

## 2018-08-05 NOTE — Progress Notes (Signed)
Hypoglycemic Event  CBG: 69  Treatment: 15 minute protocol. Gingerale and crackers. Symptoms: asymptomatic  Follow-up CBG: Time:1327 CBG Result:76  Comments/MD notified: Rodena Piety MD    Julianne Handler

## 2018-08-05 NOTE — Progress Notes (Signed)
PROGRESS NOTE    [name]  ZRA:[id number] DOB: 01/30/1946 DOA: 08/01/2018 PCP: [name], MD  Brief Narrative:73 y.o.femalewith medical history significant ofdiabetes, hypertension, coronary artery disease, GERD, vertigo, scoliosis, hyperlipidemia, asthma and chronic anticoagulation with Xarelto who presented with 1 week of persistent diarrhea with some nausea. Patient reported up to 8-10 stools a day. Associated with persistent nausea and decreased oral intake. She has been trying to keep up with fluids. Denied any fever, denied any sick contact. Denies eating outside the house. No canned foods. Patient has been trying to handle it at home not going out of her house at all. Today however she was going to the bathroom when she felt dizzy weak and passed out. She therefore decided to come to the ER. She was found to be orthostatic here with signs of dehydration so patient is being admitted for work-up. Patient reported seeing streaks of blood in her stool today..  ED Course:Initial temperature is 98.5 with blood pressure 92/58 pulse 76 respiratory rate of 16 oxygen sat 98% on room air. Patient was also orthostatic. Labs showed a white count 7.8 hemoglobin 12.7 and platelets of 266. Sodium is 139 potassium 3.4 chloride 113. Lactic acid is 1.2. Magnesium 1.0. Urinalysis not impressive. Fecal occult blood testing was positive x2.CT abdomen pelvis showed acute enteritis and stool studies currently pending. Patient is being admitted therefore for treatment and work-up   Assessment & Plan:   Principal Problem:   Acute ischemic enteritis ([place]) Active Problems:   Diabetes type 2, controlled ([place])   Dyslipidemia   HTN (hypertension)   GERD (gastroesophageal reflux disease)   Vertigo   Diastolic dysfunction   Pulmonary embolism ([facility])   Syncope   Hypokalemia    #1 acute enteritis:Patient admitted with nausea vomiting diarrhea. She started on Cipro and  Flagyl. Stool studies including C. difficile and GI panel negative. She continues to be nauseous and having 8-10 stools daily. Lactoferrin positive. She is still on IV fluids and unable to tolerate p.o. intake.FOBT positive x2 on Xarelto no evidence of active bleeding monitor closely. Patient needs ongoing medical care due to persistent nausea and diarrhea unable to tolerate p.o. intake in spite of Phenergan and Zofran.dc phenergan it made her weak Scopolamine patch ordered S/p  flex sig-mild diverticulosis left colon,internal hemorrhoids S/p egd gastiris and polyps added pepcid ppi increased to bid not biopsied as she is still on xarelto GI starting patient on dicyclomine hopefully this will help.  #2 syncope:Most likely due to orthostasis and dehydration.Continue IV fluids.  #3 diabetes:Initiate sliding scale insulin with home regimen.  #4 hypertension:Hold antihypertensives now due to low blood pressure. Resume when stable.  #5 history of pulmonary embolism:On chronic Xarelto. Continue treatment  #6 hypokalemia/hypomagnasemia:Most likely secondary to diarrhea and vomiting. Replete potassiumand mag.  #7history of vertigo:No dizziness at the moment. Symptoms seem to be more due to dehydration. Continue to monitor  #8 hyperlipidemia:Resume home regimen when stable.  #9 coronary artery disease:Stable. No chest pain. No evidence of worsening symptoms.  #10 GERD:Continue PPIs.   #11 AKI due to ongoing gastroenteritis and dehydration continue IV fluids  DVT prophylaxis:Xarelto Code Status:Full code Family Communication:None Disposition Plan:To be determined Consults called:gi  Nutrition Problem: Inadequate oral intake Etiology: nausea, vomiting     Signs/Symptoms: per patient/family report    Interventions: Glucerna shake, Magic cup  Estimated body mass index is 27.19 kg/m as calculated from the following:   Height as of this  encounter: 5\' 1"  (1.549 m).  Weight as of this encounter: 65.3 kg.   Subjective:  She is starting to complain of a dry cough and irritation in her throat fever shortness of breath Objective: Vitals:   08/04/18 1957 08/05/18 0306 08/05/18 0443 08/05/18 0942  BP: 128/62  127/69 134/71  Pulse: (!) 59  (!) 58 (!) 55  Resp: 18  16   Temp: 98 F (36.7 C)  99.1 F (37.3 C)   TempSrc: Oral  Oral   SpO2: 100%  96% 99%  Weight:  65.3 kg    Height:        Intake/Output Summary (Last 24 hours) at 08/05/2018 1155 Last data filed at 08/05/2018 0948 Gross per 24 hour  Intake 515.79 ml  Output -  Net 515.79 ml   Filed Weights   08/04/18 0544 08/04/18 0833 08/05/18 0306  Weight: 64.6 kg 64.6 kg 65.3 kg    Examination:  General exam: Appears calm and comfortable  Respiratory system: Clear to auscultation. Respiratory effort normal. Cardiovascular system: S1 & S2 heard, RRR. No JVD, murmurs, rubs, gallops or clicks. No pedal edema. Gastrointestinal system: Abdomen is nondistended, soft and nontender. No organomegaly or masses felt. Normal bowel sounds heard. Central nervous system: Alert and oriented. No focal neurological deficits. Extremities: Symmetric 5 x 5 power. Skin: No rashes, lesions or ulcers Psychiatry: Judgement and insight appear normal. Mood & affect appropriate.     Data Reviewed: I have personally reviewed following labs and imaging studies  CBC: Recent Labs  Lab 07/31/18 1102 08/01/18 1600 08/02/18 0440 08/03/18 0638 08/05/18 0711  WBC 6.0 7.8 5.6 6.5 8.2  NEUTROABS 4.0  --   --  3.8 5.4  HGB 12.9 12.7 11.2* 10.7* 10.5*  HCT 39.7 40.6 34.4* 33.2* 33.2*  MCV 96.8 100.0 97.5 97.6 101.2*  PLT 276.0 266 226 241 308   Basic Metabolic Panel: Recent Labs  Lab 07/31/18 1102 08/01/18 1600 08/01/18 1655 08/02/18 0440 08/03/18 0638 08/05/18 0711  NA 140 139  --  139 143 140  K 3.4* 3.4*  --  3.5 3.6 4.9  CL 108 113*  --  114* 121* 120*  CO2 23 17*  --   16* 15* 10*  GLUCOSE 95 108*  --  85 92 73  BUN 9 9  --  9 8 6*  CREATININE 1.01 1.70*  --  1.59* 1.24* 1.09*  CALCIUM 8.5 8.8*  --  8.4* 8.6* 8.1*  MG  --   --  1.0*  --  1.2* 2.0   GFR: Estimated Creatinine Clearance: 39.2 mL/min (A) (by C-G formula based on SCr of 1.09 mg/dL (H)). Liver Function Tests: Recent Labs  Lab 07/31/18 1102 08/01/18 1600 08/02/18 0440 08/03/18 0638  AST 21 26 19 19   ALT 25 33 28 25  ALKPHOS 56 63 47 46  BILITOT 1.0 1.5* 1.5* 1.2  PROT 6.6 6.7 5.6* 5.3*  ALBUMIN 3.9 3.6 3.0* 2.8*   Recent Labs  Lab 08/01/18 1600 08/03/18 0714  LIPASE 32 32   No results for input(s): AMMONIA in the last 168 hours. Coagulation Profile: No results for input(s): INR, PROTIME in the last 168 hours. Cardiac Enzymes: No results for input(s): CKTOTAL, CKMB, CKMBINDEX, TROPONINI in the last 168 hours. BNP (last 3 results) No results for input(s): PROBNP in the last 8760 hours. HbA1C: No results for input(s): HGBA1C in the last 72 hours. CBG: Recent Labs  Lab 08/04/18 0550 08/04/18 1200 08/04/18 1706 08/04/18 2104 08/05/18 0601  GLUCAP 83 76 85 74  76   Lipid Profile: No results for input(s): CHOL, HDL, LDLCALC, TRIG, CHOLHDL, LDLDIRECT in the last 72 hours. Thyroid Function Tests: No results for input(s): TSH, T4TOTAL, FREET4, T3FREE, THYROIDAB in the last 72 hours. Anemia Panel: No results for input(s): VITAMINB12, FOLATE, FERRITIN, TIBC, IRON, RETICCTPCT in the last 72 hours. Sepsis Labs: Recent Labs  Lab 08/01/18 1637  LATICACIDVEN 1.2    Recent Results (from the past 240 hour(s))  Stool C-Diff Toxin Assay     Status: None   Collection Time: 07/31/18  4:55 PM   Specimen: Stool   STOOL  Result Value Ref Range Status   C difficile Toxins A+B, EIA Negative Negative Final  Stool Giardia/Cryptosporidium     Status: None (In process)   Collection Time: 07/31/18  4:55 PM   Specimen: Stool  Result Value Ref Range Status   MICRO NUMBER: [id number]   Final   SPECIMEN QUALITY: Adequate  Final   Source: STOOL  Final   STATUS: FINAL  Final   RESULT: Not Detected  Final   COMMENT:   Final    NOTE: Due to intermittent shedding, one negative sample does not necessarily rule out the presence of a parasitic infection.  Stool Culture     Status: None   Collection Time: 07/31/18  4:55 PM   Specimen: Stool  Result Value Ref Range Status   MICRO NUMBER: [id number]  Final   SPECIMEN QUALITY: Adequate  Final   SOURCE: STOOL  Final   STATUS: FINAL  Final   SHIGA RESULT: Not Detected  Final   MICRO NUMBER: [id number]  Final   SPECIMEN QUALITY: Adequate  Final   Source STOOL  Final   STATUS: FINAL  Final   CAM RESULT: No enteric Campylobacter isolated  Final   MICRO NUMBER: [id number]  Final   SPECIMEN QUALITY: Adequate  Final   SOURCE: STOOL  Final   STATUS: FINAL  Final   SS RESULT: No Salmonella or Shigella isolated  Final  Gastrointestinal Panel by PCR , Stool     Status: None   Collection Time: 08/01/18  6:50 PM   Specimen: Stool  Result Value Ref Range Status   Campylobacter species NOT DETECTED NOT DETECTED Final   Plesimonas shigelloides NOT DETECTED NOT DETECTED Final   Salmonella species NOT DETECTED NOT DETECTED Final   Yersinia enterocolitica NOT DETECTED NOT DETECTED Final   Vibrio species NOT DETECTED NOT DETECTED Final   Vibrio cholerae NOT DETECTED NOT DETECTED Final   Enteroaggregative E coli (EAEC) NOT DETECTED NOT DETECTED Final   Enteropathogenic E coli (EPEC) NOT DETECTED NOT DETECTED Final   Enterotoxigenic E coli (ETEC) NOT DETECTED NOT DETECTED Final   Shiga like toxin producing E coli (STEC) NOT DETECTED NOT DETECTED Final   Shigella/Enteroinvasive E coli (EIEC) NOT DETECTED NOT DETECTED Final   Cryptosporidium NOT DETECTED NOT DETECTED Final   Cyclospora cayetanensis NOT DETECTED NOT DETECTED Final   Entamoeba histolytica NOT DETECTED NOT DETECTED Final   Giardia lamblia NOT DETECTED NOT DETECTED Final   Adenovirus  F40/41 NOT DETECTED NOT DETECTED Final   Astrovirus NOT DETECTED NOT DETECTED Final   Norovirus GI/GII NOT DETECTED NOT DETECTED Final   Rotavirus A NOT DETECTED NOT DETECTED Final   Sapovirus (I, II, IV, and V) NOT DETECTED NOT DETECTED Final    Comment: Performed at [facility], [address] [place], [place] [address]  C Difficile Quick Screen w PCR reflex     Status: None  Collection Time: 08/01/18  6:50 PM   Specimen: Stool  Result Value Ref Range Status   C Diff antigen NEGATIVE NEGATIVE Final   C Diff toxin NEGATIVE NEGATIVE Final   C Diff interpretation No C. difficile detected.  Final    Comment: Performed at [facility], [place] [address], [place], [place] [address]  Novel Coronavirus,NAA,(SEND-OUT TO REF LAB - TAT 24-48 hrs); Hosp Order     Status: None   Collection Time: 08/01/18  7:35 PM   Specimen: Nasopharyngeal Swab; Respiratory  Result Value Ref Range Status   SARS-CoV-2, NAA NOT DETECTED NOT DETECTED Final    Comment: (NOTE) This test was developed and its performance characteristics determined by [employer]. This test has not been FDA cleared or approved. This test has been authorized by FDA under an Emergency Use Authorization (EUA). This test is only authorized for the duration of time the declaration that circumstances exist justifying the authorization of the emergency use of in vitro diagnostic tests for detection of SARS-CoV-2 virus and/or diagnosis of COVID-19 infection under section 564(b)(1) of the Act, 21 U.S.C. 767HAL-9(F)(7), unless the authorization is terminated or revoked sooner. When diagnostic testing is negative, the possibility of a false negative result should be considered in the context of a patient's recent exposures and the presence of clinical signs and symptoms consistent with COVID-19. An individual without symptoms of COVID-19 and who is not shedding SARS-CoV-2 virus would expect to have a negative (not  detected) result in this assay. Performed  At: [facility] [address] [place], [place] [id number] Rush Farmer MD HG:[id number]    [place]  Final    Comment: Performed at [facility], [place] [address] [place], [place] [address]         Radiology Studies: No results found.      Scheduled Meds: . cholecalciferol  2,000 Units Oral QHS  . dicyclomine  10 mg Oral TID AC & HS  . ezetimibe  10 mg Oral Daily  . famotidine  40 mg Oral QHS  . feeding supplement (GLUCERNA SHAKE)  237 mL Oral TID BM  . insulin aspart  0-5 Units Subcutaneous QHS  . insulin aspart  0-9 Units Subcutaneous TID WC  . nebivolol  2.5 mg Oral Daily  . ondansetron  4 mg Oral TID AC  . pantoprazole  40 mg Oral BID AC  . pravastatin  20 mg Oral q1800  . rivaroxaban  20 mg Oral Q supper  . saccharomyces boulardii  500 mg Oral BID  . scopolamine  1 patch Transdermal Q72H   Continuous Infusions: . sodium chloride Stopped (08/05/18 1122)  . 0.9 % NaCl with KCl 20 mEq / L 100 mL/hr at 08/05/18 1129  . ciprofloxacin 400 mg (08/04/18 2103)  . lactated ringers Stopped (08/04/18 1320)  . metronidazole Stopped (08/05/18 1123)     LOS: 4 days     Georgette Shell, MD [facility]  If 7PM-7AM, please contact night-coverage [email or web address] Password [email or web address] 08/05/2018, 11:55 AM ,

## 2018-08-05 NOTE — Progress Notes (Signed)
  GI Progress Note Covering for Drs. Mann & Hung   Subjective  Persistent diarrhea, epigastric pain, nausea - no change.    Objective  Vital signs in last 24 hours: Temp:  [97.6 F (36.4 C)-99.1 F (37.3 C)] 99.1 F (37.3 C) (06/21 0443) Pulse Rate:  [52-67] 58 (06/21 0443) Resp:  [14-20] 16 (06/21 0443) BP: (119-139)/(58-69) 127/69 (06/21 0443) SpO2:  [96 %-100 %] 96 % (06/21 0443) Weight:  [65.3 kg] 65.3 kg (06/21 0306) Last BM Date: 08/04/18  General: Alert, well-developed, in NAD Heart:  Regular rate and rhythm; no murmurs Chest: Clear to ascultation bilaterally Abdomen:  Soft, nontender and nondistended. Normal bowel sounds, without guarding, and without rebound.   Extremities:  Without edema. Neurologic:  Alert and  oriented x4; grossly normal neurologically. Psych:  Alert and cooperative. Normal mood and affect.  Intake/Output from previous day: 06/20 0701 - 06/21 0700 In: 660 [P.O.:360; I.V.:300] Out: 0  Intake/Output this shift: No intake/output data recorded.  Lab Results: Recent Labs    08/03/18 0638 08/05/18 0711  WBC 6.5 8.2  HGB 10.7* 10.5*  HCT 33.2* 33.2*  PLT 241 224   BMET Recent Labs    08/03/18 0638 08/05/18 0711  NA 143 140  K 3.6 4.9  CL 121* 120*  CO2 15* 10*  GLUCOSE 92 73  BUN 8 6*  CREATININE 1.24* 1.09*  CALCIUM 8.6* 8.1*   LFT Recent Labs    08/03/18 0638  PROT 5.3*  ALBUMIN 2.8*  AST 19  ALT 25  ALKPHOS 46  BILITOT 1.2      Assessment & Recommendations   1. Epigastric pain, diarrhea, nausea are unchanged.  Mild gastritis was the primary finding on EGD/Flex sig yesterday. No improvement with step up in GERD therapy. Start dicyclomine 10 mg po ac & hs and Zofran 4 mg po ac.   2. Heme + stool, anemia on Xarleto.  Drs. Collene Mares and Benson Norway will resume GI care on Monday.    LOS: 4 days   Norberto Sorenson T. Fuller Plan MD 08/05/2018, 9:17 AM

## 2018-08-06 LAB — GLUCOSE, CAPILLARY
Glucose-Capillary: 75 mg/dL (ref 70–99)
Glucose-Capillary: 72 mg/dL (ref 70–99)
Glucose-Capillary: 95 mg/dL (ref 70–99)
Glucose-Capillary: 73 mg/dL (ref 70–99)
Glucose-Capillary: 86 mg/dL (ref 70–99)

## 2018-08-06 LAB — BASIC METABOLIC PANEL
Anion gap: 7 (ref 5–15)
BUN: <5 mg/dL — ABNORMAL LOW (ref 8–23)
CO2: 14 mmol/L — ABNORMAL LOW (ref 22–32)
Calcium: 8.2 mg/dL — ABNORMAL LOW (ref 8.9–10.3)
Chloride: 117 mmol/L — ABNORMAL HIGH (ref 98–111)
Creatinine, Ser: 1.03 mg/dL — ABNORMAL HIGH (ref 0.44–1.00)
GFR calc Af Amer: >60 mL/min (ref >60)
GFR calc non Af Amer: 54 mL/min — ABNORMAL LOW (ref >60)
Glucose, Bld: 108 mg/dL — ABNORMAL HIGH (ref 70–99)
Potassium: 3.8 mmol/L (ref 3.5–5.1)
Sodium: 138 mmol/L (ref 135–145)

## 2018-08-06 MED ORDER — DIPHENOXYLATE-ATROPINE 2.5-0.025 MG PO TABS
2.0000 | ORAL_TABLET | Freq: Three times a day (TID) | ORAL | Status: DC
  Administered 2018-08-06 – 2018-08-10 (×13): 2 via ORAL
  Filled 2018-08-06 (×13): qty 2

## 2018-08-06 MED ORDER — RIFAXIMIN 550 MG PO TABS
550.0000 mg | ORAL_TABLET | Freq: Two times a day (BID) | ORAL | Status: DC
  Administered 2018-08-06 – 2018-08-09 (×8): 550 mg via ORAL
  Filled 2018-08-06 (×9): qty 1

## 2018-08-06 NOTE — Anesthesia Postprocedure Evaluation (Signed)
Anesthesia Post Note  Patient: [name]  Procedure(s) Performed: ESOPHAGOGASTRODUODENOSCOPY (EGD) WITH PROPOFOL (N/A ) FLEXIBLE SIGMOIDOSCOPY (N/A )     Patient location during evaluation: PACU Anesthesia Type: MAC Level of consciousness: awake and alert Pain management: pain level controlled Vital Signs Assessment: post-procedure vital signs reviewed and stable Respiratory status: spontaneous breathing, nonlabored ventilation, respiratory function stable and patient connected to nasal cannula oxygen Cardiovascular status: stable and blood pressure returned to baseline Postop Assessment: no apparent nausea or vomiting Anesthetic complications: no    Last Vitals:  Vitals:   08/05/18 2142 08/06/18 0439  BP:  140/74  Pulse:  65  Resp:  18  Temp: 36.9 C 37 C  SpO2:  96%    Last Pain:  Vitals:   08/06/18 0439  TempSrc: Oral  PainSc:                  [name] S

## 2018-08-06 NOTE — Progress Notes (Signed)
Subjective: I had a long discussion with the patient about further workup of her diarrhea as she has not had a recent colonoscopy. Her last colonoscopy was done in 2008 and was essentially normal. She was sent several reminder but she never scheduled an office visit as she did not feel she could tolerate the prep. She had a flexible sigmoidoscopy but no biopsies were done. She is feeling much better overall as the nausea resolved once the Flagyl was discontinued. She has had 4 loose BM's today. She was having 8-10 BM's for about 10 days before she was admitted. She denies any sick contacts and has not travelled outside the state. She has been using artificial sweeteners on a daily basis. She denies having any melena but ahs had some hematochezia which she feels is from her hemorrhoids.    Objective: Vital signs in last 24 hours: Temp:  [98.5 F (36.9 C)-98.6 F (37 C)] 98.6 F (37 C) (06/22 0439) Pulse Rate:  [56-65] 56 (06/22 0933) Resp:  [16-18] 18 (06/22 0933) BP: (139-156)/(74-96) 139/76 (06/22 0933) SpO2:  [96 %-97 %] 96 % (06/22 0439) Weight:  [66.5 kg] 66.5 kg (06/22 0439) Last BM Date: 08/05/18  Intake/Output from previous day: 06/21 0701 - 06/22 0700 In: 2138.3 [P.O.:530; I.V.:1108.2; IV Piggyback:500] Out: -  Intake/Output this shift: Total I/O In: 50 [P.O.:50] Out: 1 [Stool:1]  General appearance: alert, cooperative, appears stated age and no distress Resp: clear to auscultation bilaterally Cardio: regular rate and rhythm, S1, S2 normal, no murmur, click, rub or gallop GI: soft, non-tender; bowel sounds normal; no masses,  no organomegaly Extremities: extremities normal, atraumatic, no cyanosis or edema  Lab Results: Recent Labs    08/05/18 0711  WBC 8.2  HGB 10.5*  HCT 33.2*  PLT 224   BMET Recent Labs    08/05/18 0711 08/06/18 0615  NA 140 138  K 4.9 3.8  CL 120* 117*  CO2 10* 14*  GLUCOSE 73 108*  BUN 6* <5*  CREATININE 1.09* 1.03*  CALCIUM 8.1* 8.2*    Medications: I have reviewed the patient's current medications.  Assessment/Plan: 1) Mild enteritis on CT scan/Change in bowel habits/diarhea with nausea and vomiting-the nausea and vomiting seem to have resolved. Inspite of discussing the need for a colonoscopy, after stopping Xarelto and putting her on Heparin drip for a colonoscopy, she says she is not going to be able to drink the prep as even drinking water makes her nauseated. I have discussed her situation at length with Dr. Zigmund Daniel. A trial of Xifaxan amy help. There is not much I can offer as the patient is very adamant about what she can and cannot do. She may have microscopic colitis but that cannot be confirmed without colonic biopsies. She has been advised to STOP the use of ALL artificial sweeteners as these can cause diarrhea as well. I also discussed stopping PPI's as these can cause diarrhea but she claims "she cannot live" without her PPI's. Flex sig done on 08/04/18 revealed a few scattered diverticulosis. 2) GERD/Hiatal hernia-on PPI's 3) Fatty liver/AODM-on SSC-low fat diet advised. 4) HTN-meds on hold at the present time. 5) History of PE on Xarelto. 6) AKI-resolved with IV fluids.   LOS: 5 days   Juanita Craver 08/06/2018, 12:26 PM

## 2018-08-06 NOTE — Progress Notes (Signed)
PROGRESS NOTE    [name]  BDZ:[id number] DOB: 08/06/1945 DOA: 08/01/2018 PCP: [name], MD    Brief Narrative:73 y.o.femalewith medical history significant ofdiabetes, hypertension, coronary artery disease, GERD, vertigo, scoliosis, hyperlipidemia, asthma and chronic anticoagulation with Xarelto who presented with 1 week of persistent diarrhea with some nausea. Patient reported up to 8-10 stools a day. Associated with persistent nausea and decreased oral intake. She has been trying to keep up with fluids. Denied any fever, denied any sick contact. Denies eating outside the house. No canned foods. Patient has been trying to handle it at home not going out of her house at all. Today however she was going to the bathroom when she felt dizzy weak and passed out. She therefore decided to come to the ER. She was found to be orthostatic here with signs of dehydration so patient is being admitted for work-up. Patient reported seeing streaks of blood in her stool today..  ED Course:Initial temperature is 98.5 with blood pressure 92/58 pulse 76 respiratory rate of 16 oxygen sat 98% on room air. Patient was also orthostatic. Labs showed a white count 7.8 hemoglobin 12.7 and platelets of 266. Sodium is 139 potassium 3.4 chloride 113. Lactic acid is 1.2. Magnesium 1.0. Urinalysis not impressive. Fecal occult blood testing was positive x2.CT abdomen pelvis showed acute enteritis and stool studies currently pending. Patient is being admitted therefore for treatment and work-up  Assessment & Plan:   Principal Problem:   Acute ischemic enteritis ([place]) Active Problems:   Diabetes type 2, controlled ([place])   Dyslipidemia   HTN (hypertension)   GERD (gastroesophageal reflux disease)   Vertigo   Diastolic dysfunction   Pulmonary embolism (HCC)   Syncope   Hypokalemia   #1 acute enteritis:Patient admitted with nausea vomiting diarrhea. She was  started on Cipro and  Flagyl. Stool studies including C. difficile and GI panel negative. She continues to be nauseous and having 8-10 stools daily. Lactoferrin positive. She is still on IV fluids and unable to tolerate p.o. intake.FOBT positive x2 on Xarelto no evidence of active bleeding monitor closely. Patient needs ongoing medical care due to persistent nausea and diarrhea unable to tolerate p.o. intake in spite of Phenergan and Zofran.dc phenergan it made her weak Scopolamine patch ordered S/p flex sig-mild diverticulosis left colon,internal hemorrhoids S/p egd gastiris and polyps added pepcid ppi increased to bid not biopsied as she is still on xarelto GI starting patient on dicyclomine  -I will stop her Cipro and Flagyl 08/06/2018 Flagyl must be worsening her nausea. -Changed Imodium to standing order  #2 syncope:Most likely due to orthostasis and dehydration.Continue IV fluids.  #3 diabetes:Initiate sliding scale insulin with home regimen.  #4 hypertension:Hold antihypertensives now due to low blood pressure. Resume when stable.  #5 history of pulmonary embolism:On chronic Xarelto. Continue treatment  #6 hypokalemia/hypomagnasemia:Most likely secondary to diarrhea and vomiting. Replete potassiumand mag.  #7history of vertigo:No dizziness at the moment. Symptoms seem to be more due to dehydration. Continue to monitor  #8 hyperlipidemia:Resume home regimen when stable.  #9 coronary artery disease:Stable. No chest pain. No evidence of worsening symptoms.  #10 GERD:Continue PPIs.  #11 AKI resolved with IV fluids.   DVT prophylaxis:Xarelto Code Status:Full code Family Communication:None Disposition Plan:To be determined Consults called:gi    Nutrition Problem: Inadequate oral intake Etiology: nausea, vomiting     Signs/Symptoms: per patient/family report    Interventions: Glucerna shake, Magic cup  Estimated body mass index is 27.68 kg/m as  calculated from  the following:   Height as of this encounter: 5\' 1"  (1.549 m).   Weight as of this encounter: 66.5 kg.    Subjective:  Still with similar complaints of nausea vomiting and diarrhea reports not able to eat or keep anything down vomited this morning she feels like nothing is helped her so far Objective: Vitals:   08/05/18 2142 08/06/18 0439 08/06/18 0933 08/06/18 1250  BP:  140/74 139/76 138/84  Pulse:  65 (!) 56 67  Resp:  18 18 18   Temp: 98.5 F (36.9 C) 98.6 F (37 C)  98.5 F (36.9 C)  TempSrc: Oral Oral  Oral  SpO2:  96%  97%  Weight:  66.5 kg    Height:        Intake/Output Summary (Last 24 hours) at 08/06/2018 1315 Last data filed at 08/06/2018 1000 Gross per 24 hour  Intake 1532.47 ml  Output 1 ml  Net 1531.47 ml   Filed Weights   08/04/18 0833 08/05/18 0306 08/06/18 0439  Weight: 64.6 kg 65.3 kg 66.5 kg    Examination:  General exam: Appears calm and comfortable  Respiratory system: Clear to auscultation. Respiratory effort normal. Cardiovascular system: S1 & S2 heard, RRR. No JVD, murmurs, rubs, gallops or clicks. No pedal edema. Gastrointestinal system: Abdomen is nondistended, soft and nontender. No organomegaly or masses felt. Normal bowel sounds heard. Central nervous system: Alert and oriented. No focal neurological deficits. Extremities: Symmetric 5 x 5 power. Skin: No rashes, lesions or ulcers Psychiatry: Judgement and insight appear normal. Mood & affect appropriate.     Data Reviewed: I have personally reviewed following labs and imaging studies  CBC: Recent Labs  Lab 07/31/18 1102 08/01/18 1600 08/02/18 0440 08/03/18 0638 08/05/18 0711  WBC 6.0 7.8 5.6 6.5 8.2  NEUTROABS 4.0  --   --  3.8 5.4  HGB 12.9 12.7 11.2* 10.7* 10.5*  HCT 39.7 40.6 34.4* 33.2* 33.2*  MCV 96.8 100.0 97.5 97.6 101.2*  PLT 276.0 266 226 241 614   Basic Metabolic Panel: Recent Labs  Lab 08/01/18 1600 08/01/18 1655 08/02/18 0440 08/03/18 0638  08/05/18 0711 08/06/18 0615  NA 139  --  139 143 140 138  K 3.4*  --  3.5 3.6 4.9 3.8  CL 113*  --  114* 121* 120* 117*  CO2 17*  --  16* 15* 10* 14*  GLUCOSE 108*  --  85 92 73 108*  BUN 9  --  9 8 6* <5*  CREATININE 1.70*  --  1.59* 1.24* 1.09* 1.03*  CALCIUM 8.8*  --  8.4* 8.6* 8.1* 8.2*  MG  --  1.0*  --  1.2* 2.0  --    GFR: Estimated Creatinine Clearance: 41.8 mL/min (A) (by C-G formula based on SCr of 1.03 mg/dL (H)). Liver Function Tests: Recent Labs  Lab 07/31/18 1102 08/01/18 1600 08/02/18 0440 08/03/18 0638  AST 21 26 19 19   ALT 25 33 28 25  ALKPHOS 56 63 47 46  BILITOT 1.0 1.5* 1.5* 1.2  PROT 6.6 6.7 5.6* 5.3*  ALBUMIN 3.9 3.6 3.0* 2.8*   Recent Labs  Lab 08/01/18 1600 08/03/18 0714  LIPASE 32 32   No results for input(s): AMMONIA in the last 168 hours. Coagulation Profile: No results for input(s): INR, PROTIME in the last 168 hours. Cardiac Enzymes: No results for input(s): CKTOTAL, CKMB, CKMBINDEX, TROPONINI in the last 168 hours. BNP (last 3 results) No results for input(s): PROBNP in the last 8760 hours. HbA1C: No  results for input(s): HGBA1C in the last 72 hours. CBG: Recent Labs  Lab 08/05/18 1327 08/05/18 1601 08/05/18 2140 08/06/18 0557 08/06/18 1101  GLUCAP 76 102* 70 72 95   Lipid Profile: No results for input(s): CHOL, HDL, LDLCALC, TRIG, CHOLHDL, LDLDIRECT in the last 72 hours. Thyroid Function Tests: No results for input(s): TSH, T4TOTAL, FREET4, T3FREE, THYROIDAB in the last 72 hours. Anemia Panel: No results for input(s): VITAMINB12, FOLATE, FERRITIN, TIBC, IRON, RETICCTPCT in the last 72 hours. Sepsis Labs: Recent Labs  Lab 08/01/18 1637  LATICACIDVEN 1.2    Recent Results (from the past 240 hour(s))  Stool C-Diff Toxin Assay     Status: None   Collection Time: 07/31/18  4:55 PM   Specimen: Stool   STOOL  Result Value Ref Range Status   C difficile Toxins A+B, EIA Negative Negative Final  Stool  Giardia/Cryptosporidium     Status: None (In process)   Collection Time: 07/31/18  4:55 PM   Specimen: Stool  Result Value Ref Range Status   MICRO NUMBER: [id number]  Final   SPECIMEN QUALITY: Adequate  Final   Source: STOOL  Final   STATUS: FINAL  Final   RESULT: Not Detected  Final   COMMENT:   Final    NOTE: Due to intermittent shedding, one negative sample does not necessarily rule out the presence of a parasitic infection.  Stool Culture     Status: None   Collection Time: 07/31/18  4:55 PM   Specimen: Stool  Result Value Ref Range Status   MICRO NUMBER: [id number]  Final   SPECIMEN QUALITY: Adequate  Final   SOURCE: STOOL  Final   STATUS: FINAL  Final   SHIGA RESULT: Not Detected  Final   MICRO NUMBER: [id number]  Final   SPECIMEN QUALITY: Adequate  Final   Source STOOL  Final   STATUS: FINAL  Final   CAM RESULT: No enteric Campylobacter isolated  Final   MICRO NUMBER: [id number]  Final   SPECIMEN QUALITY: Adequate  Final   SOURCE: STOOL  Final   STATUS: FINAL  Final   SS RESULT: No Salmonella or Shigella isolated  Final  Gastrointestinal Panel by PCR , Stool     Status: None   Collection Time: 08/01/18  6:50 PM   Specimen: Stool  Result Value Ref Range Status   Campylobacter species NOT DETECTED NOT DETECTED Final   Plesimonas shigelloides NOT DETECTED NOT DETECTED Final   Salmonella species NOT DETECTED NOT DETECTED Final   Yersinia enterocolitica NOT DETECTED NOT DETECTED Final   Vibrio species NOT DETECTED NOT DETECTED Final   Vibrio cholerae NOT DETECTED NOT DETECTED Final   Enteroaggregative E coli (EAEC) NOT DETECTED NOT DETECTED Final   Enteropathogenic E coli (EPEC) NOT DETECTED NOT DETECTED Final   Enterotoxigenic E coli (ETEC) NOT DETECTED NOT DETECTED Final   Shiga like toxin producing E coli (STEC) NOT DETECTED NOT DETECTED Final   Shigella/Enteroinvasive E coli (EIEC) NOT DETECTED NOT DETECTED Final   Cryptosporidium NOT DETECTED NOT DETECTED Final    Cyclospora cayetanensis NOT DETECTED NOT DETECTED Final   Entamoeba histolytica NOT DETECTED NOT DETECTED Final   Giardia lamblia NOT DETECTED NOT DETECTED Final   Adenovirus F40/41 NOT DETECTED NOT DETECTED Final   Astrovirus NOT DETECTED NOT DETECTED Final   Norovirus GI/GII NOT DETECTED NOT DETECTED Final   Rotavirus A NOT DETECTED NOT DETECTED Final   Sapovirus (I, II, IV, and V) NOT DETECTED NOT DETECTED Final  Comment: Performed at [facility], [place], [place] [address]  C Difficile Quick Screen w PCR reflex     Status: None   Collection Time: 08/01/18  6:50 PM   Specimen: Stool  Result Value Ref Range Status   C Diff antigen NEGATIVE NEGATIVE Final   C Diff toxin NEGATIVE NEGATIVE Final   C Diff interpretation No C. difficile detected.  Final    Comment: Performed at [facility], [place] [address], [place], [place] [address]  Novel Coronavirus,NAA,(SEND-OUT TO REF LAB - TAT 24-48 hrs); Hosp Order     Status: None   Collection Time: 08/01/18  7:35 PM   Specimen: Nasopharyngeal Swab; Respiratory  Result Value Ref Range Status   SARS-CoV-2, NAA NOT DETECTED NOT DETECTED Final    Comment: (NOTE) This test was developed and its performance characteristics determined by [employer]. This test has not been FDA cleared or approved. This test has been authorized by FDA under an Emergency Use Authorization (EUA). This test is only authorized for the duration of time the declaration that circumstances exist justifying the authorization of the emergency use of in vitro diagnostic tests for detection of SARS-CoV-2 virus and/or diagnosis of COVID-19 infection under section 564(b)(1) of the Act, 21 U.S.C. 782UMP-5(T)(6), unless the authorization is terminated or revoked sooner. When diagnostic testing is negative, the possibility of a false negative result should be considered in the context of a patient's recent exposures and the presence of  clinical signs and symptoms consistent with COVID-19. An individual without symptoms of COVID-19 and who is not shedding SARS-CoV-2 virus would expect to have a negative (not detected) result in this assay. Performed  At: [facility] [address] [place], [place] [id number] Rush Farmer MD QQ:[phone]    [place]  Final    Comment: Performed at [facility], [place] [address] [place], [place] [address]         Radiology Studies: No results found.      Scheduled Meds: . cholecalciferol  2,000 Units Oral QHS  . dicyclomine  10 mg Oral TID AC & HS  . diphenoxylate-atropine  2 tablet Oral TID  . famotidine  40 mg Oral QHS  . feeding supplement (GLUCERNA SHAKE)  237 mL Oral TID BM  . insulin aspart  0-5 Units Subcutaneous QHS  . insulin aspart  0-9 Units Subcutaneous TID WC  . nebivolol  2.5 mg Oral Daily  . ondansetron  4 mg Oral TID AC  . pantoprazole  40 mg Oral BID AC  . pravastatin  20 mg Oral q1800  . rivaroxaban  20 mg Oral Q supper  . saccharomyces boulardii  500 mg Oral BID  . scopolamine  1 patch Transdermal Q72H   Continuous Infusions: . sodium chloride 1,000 mL (08/05/18 1425)  . 0.9 % NaCl with KCl 20 mEq / L 100 mL/hr at 08/06/18 0943  . lactated ringers Stopped (08/04/18 1320)     LOS: 5 days    Georgette Shell, MD [facility]  If 7PM-7AM, please contact night-coverage [email or web address] Password [email or web address] 08/06/2018, 1:15 PM

## 2018-08-06 NOTE — Care Management Important Message (Signed)
Important Message  Patient Details  Name: [name] MRN: [id number] Date of Birth: 1945/09/14   Medicare Important Message Given:  Yes     [name] 08/06/2018, 12:49 PM

## 2018-08-07 LAB — BASIC METABOLIC PANEL
Anion gap: 7 (ref 5–15)
BUN: <5 mg/dL — ABNORMAL LOW (ref 8–23)
CO2: 17 mmol/L — ABNORMAL LOW (ref 22–32)
Calcium: 8.3 mg/dL — ABNORMAL LOW (ref 8.9–10.3)
Chloride: 115 mmol/L — ABNORMAL HIGH (ref 98–111)
Creatinine, Ser: 1.01 mg/dL — ABNORMAL HIGH (ref 0.44–1.00)
GFR calc Af Amer: >60 mL/min (ref >60)
GFR calc non Af Amer: 55 mL/min — ABNORMAL LOW (ref >60)
Glucose, Bld: 82 mg/dL (ref 70–99)
Potassium: 3.8 mmol/L (ref 3.5–5.1)
Sodium: 139 mmol/L (ref 135–145)

## 2018-08-07 LAB — STOOL CULTURE
MICRO NUMBER:: 574462
MICRO NUMBER:: 574463
MICRO NUMBER:: 574465
SHIGA RESULT:: NOT DETECTED
SPECIMEN QUALITY:: ADEQUATE
SPECIMEN QUALITY:: ADEQUATE
SPECIMEN QUALITY:: ADEQUATE

## 2018-08-07 LAB — GLUCOSE, CAPILLARY
Glucose-Capillary: 77 mg/dL (ref 70–99)
Glucose-Capillary: 92 mg/dL (ref 70–99)
Glucose-Capillary: 64 mg/dL — ABNORMAL LOW (ref 70–99)
Glucose-Capillary: 65 mg/dL — ABNORMAL LOW (ref 70–99)
Glucose-Capillary: 81 mg/dL (ref 70–99)
Glucose-Capillary: 93 mg/dL (ref 70–99)

## 2018-08-07 LAB — FECAL LACTOFERRIN, QUANT
Fecal Lactoferrin: POSITIVE — AB
MICRO NUMBER:: 574568
SPECIMEN QUALITY:: ADEQUATE

## 2018-08-07 LAB — GIARDIA/CRYPTOSPORIDIUM (EIA)
MICRO NUMBER:: 574464
MICRO NUMBER:: 574611
RESULT:: NOT DETECTED
RESULT:: NOT DETECTED
SPECIMEN QUALITY:: ADEQUATE
SPECIMEN QUALITY:: ADEQUATE

## 2018-08-07 MED ORDER — NEBIVOLOL HCL 5 MG PO TABS
5.0000 mg | ORAL_TABLET | Freq: Every day | ORAL | Status: DC
  Administered 2018-08-10: 5 mg via ORAL
  Filled 2018-08-07 (×3): qty 1

## 2018-08-07 MED ORDER — LOPERAMIDE HCL 2 MG PO CAPS
4.0000 mg | ORAL_CAPSULE | Freq: Four times a day (QID) | ORAL | Status: AC
  Administered 2018-08-07 – 2018-08-08 (×7): 4 mg via ORAL
  Filled 2018-08-07 (×9): qty 2

## 2018-08-07 MED ORDER — CHOLESTYRAMINE 4 G PO PACK
4.0000 g | PACK | ORAL | Status: DC
  Administered 2018-08-07 – 2018-08-08 (×2): 4 g via ORAL
  Filled 2018-08-07 (×10): qty 1

## 2018-08-07 NOTE — Progress Notes (Signed)
Subjective: Still with diarrhea.  GERD symptoms have essentially resolved.  Objective: Vital signs in last 24 hours: Temp:  [98.4 F (36.9 C)-98.8 F (37.1 C)] 98.8 F (37.1 C) (06/23 1145) Pulse Rate:  [57-66] 61 (06/23 1145) Resp:  [16-18] 16 (06/23 1145) BP: (136-146)/(57-72) 139/72 (06/23 1145) SpO2:  [97 %-99 %] 98 % (06/23 1145) Weight:  [66.2 kg] 66.2 kg (06/23 0422) Last BM Date: 08/07/18  Intake/Output from previous day: 06/22 0701 - 06/23 0700 In: 3302.9 [P.O.:890; I.V.:2412.9] Out: 202 [Urine:200; Stool:2] Intake/Output this shift: Total I/O In: 240 [P.O.:240] Out: 500 [Urine:500]  General appearance: alert and no distress  Lab Results: Recent Labs    08/05/18 0711  WBC 8.2  HGB 10.5*  HCT 33.2*  PLT 224   BMET Recent Labs    08/05/18 0711 08/06/18 0615 08/07/18 0537  NA 140 138 139  K 4.9 3.8 3.8  CL 120* 117* 115*  CO2 10* 14* 17*  GLUCOSE 73 108* 82  BUN 6* <5* <5*  CREATININE 1.09* 1.03* 1.01*  CALCIUM 8.1* 8.2* 8.3*   LFT No results for input(s): PROT, ALBUMIN, AST, ALT, ALKPHOS, BILITOT, BILIDIR, IBILI in the last 72 hours. PT/INR No results for input(s): LABPROT, INR in the last 72 hours. Hepatitis Panel No results for input(s): HEPBSAG, HCVAB, HEPAIGM, HEPBIGM in the last 72 hours. C-Diff No results for input(s): CDIFFTOX in the last 72 hours. Fecal Lactopherrin No results for input(s): FECLLACTOFRN in the last 72 hours.  Studies/Results: No results found.  Medications:  Scheduled: . cholecalciferol  2,000 Units Oral QHS  . cholestyramine  4 g Oral TID  . dicyclomine  10 mg Oral TID AC & HS  . diphenoxylate-atropine  2 tablet Oral TID  . famotidine  40 mg Oral QHS  . feeding supplement (GLUCERNA SHAKE)  237 mL Oral TID BM  . insulin aspart  0-5 Units Subcutaneous QHS  . insulin aspart  0-9 Units Subcutaneous TID WC  . loperamide  4 mg Oral QID  . [START ON 08/08/2018] nebivolol  5 mg Oral Daily  . ondansetron  4 mg Oral  TID AC  . pantoprazole  40 mg Oral BID AC  . pravastatin  20 mg Oral q1800  . rifaximin  550 mg Oral BID  . rivaroxaban  20 mg Oral Q supper  . saccharomyces boulardii  500 mg Oral BID  . scopolamine  1 patch Transdermal Q72H   Continuous: . sodium chloride 1,000 mL (08/05/18 1425)  . 0.9 % NaCl with KCl 20 mEq / L 100 mL/hr at 08/07/18 0656  . lactated ringers Stopped (08/04/18 1320)    Assessment/Plan: 1) Diarrhea. 2) GERD resolved. 3) History of DVT.   She continues to have diarrhea.  The source of her diarrhea is unclear.  The FFS was grossly normal and biopsies were not obtained as she restarted back her anticoagulation.  Xifaxan was started yesterday as well as scheduled Lomotil without any overt improvement.  Plan: 1) Continue with Lomotil and Xifaxan. 2) Start cholestyramine. 3) If the diarrhea persists, a repeat FFS with biopsies should be performed, on anticoagulation.  The bleeding will be minor, overall.  LOS: 6 days   Michall Noffke D 08/07/2018, 2:59 PM

## 2018-08-07 NOTE — TOC Progression Note (Deleted)
Transition of Care [facility]) - Progression Note    Patient Details  Name: [name] MRN: [id number] Date of Birth: [birth year]  [facility] CM/SW Contact  Bartholomew Crews, RN Phone Number: 08/07/2018, 5:23 PM  Clinical Narrative:       Expected Discharge Plan: [place] Barriers to Discharge: Continued Medical Work up  Expected Discharge Plan and Services Expected Discharge Plan: [place] In-house Referral: NA Discharge Planning Services: CM Consult Post Acute Care Choice: Home Health, Durable Medical Equipment Living arrangements for the past 2 months: Single Family Home                 DME Arranged: 3-N-1, [place] rolling DME Agency: [employer] Date DME Agency Contacted: 08/07/18 Time DME Agency Contacted: [phone] Representative spoke with at DME Agency: Thedore Mins HH Arranged: RN, PT [place] Agency: [place] ([place]) Date [place]: 08/07/18 Time [place]: 1610 Representative spoke with at [place]: [place] ([place]) Interventions    Readmission Risk Interventions No flowsheet data found.

## 2018-08-07 NOTE — Progress Notes (Addendum)
PROGRESS NOTE    [name]  VEL:[id number] DOB: Jun 29, 1945 DOA: 08/01/2018 PCP: [name], MD    Brief Narrative: 73 y.o.femalewith medical history significant ofdiabetes, hypertension, coronary artery disease, GERD, vertigo, scoliosis, hyperlipidemia, asthma and chronic anticoagulation with Xarelto who presented with 1 week of persistent diarrhea with some nausea. Patient reported up to 8-10 stools a day. Associated with persistent nausea and decreased oral intake. She has been trying to keep up with fluids. Denied any fever, denied any sick contact. Denies eating outside the house. No canned foods. Patient has been trying to handle it at home not going out of her house at all. Today however she was going to the bathroom when she felt dizzy weak and passed out. She therefore decided to come to the ER. She was found to be orthostatic here with signs of dehydration so patient is being admitted for work-up. Patient reported seeing streaks of blood in her stool today..  ED Course:Initial temperature is 98.5 with blood pressure 92/58 pulse 76 respiratory rate of 16 oxygen sat 98% on room air. Patient was also orthostatic. Labs showed a white count 7.8 hemoglobin 12.7 and platelets of 266. Sodium is 139 potassium 3.4 chloride 113. Lactic acid is 1.2. Magnesium 1.0. Urinalysis not impressive. Fecal occult blood testing was positive x2.CT abdomen pelvis showed acute enteritis and stool studies currently pending. Patient is being admitted therefore for treatment and work-up  In summary this patient was admitted with nausea vomiting and diarrhea GI panel and C. difficile negative lactoferrin positive.  She had EGD and flex sig unprepped.  She continues to complain of the same complaints in spite of treatment with Cipro Flagyl and Imodium probiotics dicyclomine IV fluids.  Assessment & Plan:   Principal Problem:   Acute ischemic enteritis (HCC) Active Problems:    Diabetes type 2, controlled ([place])   Dyslipidemia   HTN (hypertension)   GERD (gastroesophageal reflux disease)   Vertigo   Diastolic dysfunction   Pulmonary embolism (HCC)   Syncope   Hypokalemia  #1 acute enteritis:Patient admitted with nausea vomiting diarrhea. She was  started on Cipro and Flagyl. Stool studies including C. difficile and GI panel negative. She continues to be nauseous and having 8-10 stools daily. Lactoferrin positive. She is still on IV fluids and unable to tolerate p.o. intake.FOBT positive x2 on Xarelto no evidence of active bleeding monitor closely. Patient needs ongoing medical care due to persistent nausea and diarrhea unable to tolerate p.o. intake in spite of Phenergan and Zofran.dc phenergan it made her weak Scopolamine patch ordered S/p flex sig-mild diverticulosis left colon,internal hemorrhoids S/p egd gastiris and polyps added pepcid ppi increased to bid not biopsied as she is still on xarelto GI starting patient on dicyclomine  -I will stop her Cipro and Flagyl 08/06/2018 Flagyl must be worsening her nausea. -Changed Imodium to standing order -She feels her nausea is better however she has not had real food continues to have multiple episodes of diarrhea. -She was started on rifaximin 6/22 see if it makes any improvement in her symptoms. -Continue symptomatic treatment for now  -PT consulted 6/23 -Patient reports she does not want to go to rehab nor does she want to go home like this because of weakness.   #2 syncope:Most likely due to orthostasis and dehydration.Has not had any syncopal episodes during the hospital stay.  Any IV fluids since she is not taking p.o.  #3 diabetes:Initiate sliding scale insulin with home regimen.  #4 hypertension: Increase Bystolic  to 5 mg daily  #5 history of pulmonary embolism:On chronic Xarelto. Continue treatment  #6 hypokalemia/hypomagnasemia:Most likely secondary to diarrhea and vomiting.  Replete potassiumand mag.  #7history of vertigo:No dizziness at the moment. Symptoms seem to be more due to dehydration. Continue to monitor  #8 hyperlipidemia:Resume home regimen when stable.  #9 coronary artery disease:Stable. No chest pain. No evidence of worsening symptoms.  #10 GERD:Continue PPIs.    Nutrition Problem: Inadequate oral intake Etiology: nausea, vomiting     Signs/Symptoms: per patient/family report    Interventions: Glucerna shake, Magic cup  Estimated body mass index is 27.58 kg/m as calculated from the following:   Height as of this encounter: 5\' 1"  (1.549 m).   Weight as of this encounter: 66.2 kg.    Subjective:  Patient continues to complain of nausea not any better feeling worse since coming into the hospital Objective: Vitals:   08/06/18 1250 08/06/18 1924 08/07/18 0422 08/07/18 0911  BP: 138/84 (!) 136/57 140/71 (!) 146/72  Pulse: 67 (!) 59 66 (!) 57  Resp: 18 18 18 18   Temp: 98.5 F (36.9 C) 98.4 F (36.9 C) 98.6 F (37 C) 98.4 F (36.9 C)  TempSrc: Oral Oral Oral Oral  SpO2: 97% 99% 97% 99%  Weight:   66.2 kg   Height:        Intake/Output Summary (Last 24 hours) at 08/07/2018 1026 Last data filed at 08/07/2018 0900 Gross per 24 hour  Intake 2732.85 ml  Output 201 ml  Net 2531.85 ml   Filed Weights   08/05/18 0306 08/06/18 0439 08/07/18 0422  Weight: 65.3 kg 66.5 kg 66.2 kg    Examination:  General exam: Appears calm and comfortable  Respiratory system: Clear to auscultation. Respiratory effort normal. Cardiovascular system: S1 & S2 heard, RRR. No JVD, murmurs, rubs, gallops or clicks. No pedal edema. Gastrointestinal system: Abdomen is nondistended, soft and nontender. No organomegaly or masses felt. Normal bowel sounds heard. Central nervous system: Alert and oriented. No focal neurological deficits. Extremities: Symmetric 5 x 5 power. Skin: No rashes, lesions or ulcers Psychiatry: Judgement and  insight appear normal. Mood & affect appropriate.     Data Reviewed: I have personally reviewed following labs and imaging studies  CBC: Recent Labs  Lab 07/31/18 1102 08/01/18 1600 08/02/18 0440 08/03/18 0638 08/05/18 0711  WBC 6.0 7.8 5.6 6.5 8.2  NEUTROABS 4.0  --   --  3.8 5.4  HGB 12.9 12.7 11.2* 10.7* 10.5*  HCT 39.7 40.6 34.4* 33.2* 33.2*  MCV 96.8 100.0 97.5 97.6 101.2*  PLT 276.0 266 226 241 510   Basic Metabolic Panel: Recent Labs  Lab 08/01/18 1655 08/02/18 0440 08/03/18 0638 08/05/18 0711 08/06/18 0615 08/07/18 0537  NA  --  139 143 140 138 139  K  --  3.5 3.6 4.9 3.8 3.8  CL  --  114* 121* 120* 117* 115*  CO2  --  16* 15* 10* 14* 17*  GLUCOSE  --  85 92 73 108* 82  BUN  --  9 8 6* <5* <5*  CREATININE  --  1.59* 1.24* 1.09* 1.03* 1.01*  CALCIUM  --  8.4* 8.6* 8.1* 8.2* 8.3*  MG 1.0*  --  1.2* 2.0  --   --    GFR: Estimated Creatinine Clearance: 42.6 mL/min (A) (by C-G formula based on SCr of 1.01 mg/dL (H)). Liver Function Tests: Recent Labs  Lab 07/31/18 1102 08/01/18 1600 08/02/18 0440 08/03/18 0638  AST 21 26 19  19  ALT 25 33 28 25  ALKPHOS 56 63 47 46  BILITOT 1.0 1.5* 1.5* 1.2  PROT 6.6 6.7 5.6* 5.3*  ALBUMIN 3.9 3.6 3.0* 2.8*   Recent Labs  Lab 08/01/18 1600 08/03/18 0714  LIPASE 32 32   No results for input(s): AMMONIA in the last 168 hours. Coagulation Profile: No results for input(s): INR, PROTIME in the last 168 hours. Cardiac Enzymes: No results for input(s): CKTOTAL, CKMB, CKMBINDEX, TROPONINI in the last 168 hours. BNP (last 3 results) No results for input(s): PROBNP in the last 8760 hours. HbA1C: No results for input(s): HGBA1C in the last 72 hours. CBG: Recent Labs  Lab 08/06/18 0557 08/06/18 1101 08/06/18 1611 08/06/18 2124 08/07/18 0611  GLUCAP 72 95 73 86 77   Lipid Profile: No results for input(s): CHOL, HDL, LDLCALC, TRIG, CHOLHDL, LDLDIRECT in the last 72 hours. Thyroid Function Tests: No results for  input(s): TSH, T4TOTAL, FREET4, T3FREE, THYROIDAB in the last 72 hours. Anemia Panel: No results for input(s): VITAMINB12, FOLATE, FERRITIN, TIBC, IRON, RETICCTPCT in the last 72 hours. Sepsis Labs: Recent Labs  Lab 08/01/18 1637  LATICACIDVEN 1.2    Recent Results (from the past 240 hour(s))  Stool C-Diff Toxin Assay     Status: None   Collection Time: 07/31/18  4:55 PM   Specimen: Stool   STOOL  Result Value Ref Range Status   C difficile Toxins A+B, EIA Negative Negative Final  Stool Giardia/Cryptosporidium     Status: None (In process)   Collection Time: 07/31/18  4:55 PM   Specimen: Stool  Result Value Ref Range Status   MICRO NUMBER: [id number]  Final   SPECIMEN QUALITY: Adequate  Final   Source: STOOL  Final   STATUS: FINAL  Final   RESULT: Not Detected  Final   COMMENT:   Final    NOTE: Due to intermittent shedding, one negative sample does not necessarily rule out the presence of a parasitic infection.  Stool Culture     Status: None   Collection Time: 07/31/18  4:55 PM   Specimen: Stool  Result Value Ref Range Status   MICRO NUMBER: [id number]  Final   SPECIMEN QUALITY: Adequate  Final   SOURCE: STOOL  Final   STATUS: FINAL  Final   SHIGA RESULT: Not Detected  Final   MICRO NUMBER: [id number]  Final   SPECIMEN QUALITY: Adequate  Final   Source STOOL  Final   STATUS: FINAL  Final   CAM RESULT: No enteric Campylobacter isolated  Final   MICRO NUMBER: [id number]  Final   SPECIMEN QUALITY: Adequate  Final   SOURCE: STOOL  Final   STATUS: FINAL  Final   SS RESULT: No Salmonella or Shigella isolated  Final  Gastrointestinal Panel by PCR , Stool     Status: None   Collection Time: 08/01/18  6:50 PM   Specimen: Stool  Result Value Ref Range Status   Campylobacter species NOT DETECTED NOT DETECTED Final   Plesimonas shigelloides NOT DETECTED NOT DETECTED Final   Salmonella species NOT DETECTED NOT DETECTED Final   Yersinia enterocolitica NOT DETECTED NOT DETECTED Final    Vibrio species NOT DETECTED NOT DETECTED Final   Vibrio cholerae NOT DETECTED NOT DETECTED Final   Enteroaggregative E coli (EAEC) NOT DETECTED NOT DETECTED Final   Enteropathogenic E coli (EPEC) NOT DETECTED NOT DETECTED Final   Enterotoxigenic E coli (ETEC) NOT DETECTED NOT DETECTED Final   Shiga like toxin producing E coli (STEC) NOT  DETECTED NOT DETECTED Final   Shigella/Enteroinvasive E coli (EIEC) NOT DETECTED NOT DETECTED Final   Cryptosporidium NOT DETECTED NOT DETECTED Final   Cyclospora cayetanensis NOT DETECTED NOT DETECTED Final   Entamoeba histolytica NOT DETECTED NOT DETECTED Final   Giardia lamblia NOT DETECTED NOT DETECTED Final   Adenovirus F40/41 NOT DETECTED NOT DETECTED Final   Astrovirus NOT DETECTED NOT DETECTED Final   Norovirus GI/GII NOT DETECTED NOT DETECTED Final   Rotavirus A NOT DETECTED NOT DETECTED Final   Sapovirus (I, II, IV, and V) NOT DETECTED NOT DETECTED Final    Comment: Performed at [facility], [address], [place], [place] [address]  C Difficile Quick Screen w PCR reflex     Status: None   Collection Time: 08/01/18  6:50 PM   Specimen: Stool  Result Value Ref Range Status   C Diff antigen NEGATIVE NEGATIVE Final   C Diff toxin NEGATIVE NEGATIVE Final   C Diff interpretation No C. difficile detected.  Final    Comment: Performed at [facility], [place] [address], [place], [place] [address]  Novel Coronavirus,NAA,(SEND-OUT TO REF LAB - TAT 24-48 hrs); Hosp Order     Status: None   Collection Time: 08/01/18  7:35 PM   Specimen: Nasopharyngeal Swab; Respiratory  Result Value Ref Range Status   SARS-CoV-2, NAA NOT DETECTED NOT DETECTED Final    Comment: (NOTE) This test was developed and its performance characteristics determined by [employer]. This test has not been FDA cleared or approved. This test has been authorized by FDA under an Emergency Use Authorization (EUA). This test is only authorized for the duration  of time the declaration that circumstances exist justifying the authorization of the emergency use of in vitro diagnostic tests for detection of SARS-CoV-2 virus and/or diagnosis of COVID-19 infection under section 564(b)(1) of the Act, 21 U.S.C. 419FXT-0(W)(4), unless the authorization is terminated or revoked sooner. When diagnostic testing is negative, the possibility of a false negative result should be considered in the context of a patient's recent exposures and the presence of clinical signs and symptoms consistent with COVID-19. An individual without symptoms of COVID-19 and who is not shedding SARS-CoV-2 virus would expect to have a negative (not detected) result in this assay. Performed  At: [facility] [address] [place], [place] [id number] Rush Farmer MD ME:[id number]    [place]  Final    Comment: Performed at [facility], [place] [address] [place], [place] [address]         Radiology Studies: No results found.      Scheduled Meds: . cholecalciferol  2,000 Units Oral QHS  . dicyclomine  10 mg Oral TID AC & HS  . diphenoxylate-atropine  2 tablet Oral TID  . famotidine  40 mg Oral QHS  . feeding supplement (GLUCERNA SHAKE)  237 mL Oral TID BM  . insulin aspart  0-5 Units Subcutaneous QHS  . insulin aspart  0-9 Units Subcutaneous TID WC  . nebivolol  2.5 mg Oral Daily  . ondansetron  4 mg Oral TID AC  . pantoprazole  40 mg Oral BID AC  . pravastatin  20 mg Oral q1800  . rifaximin  550 mg Oral BID  . rivaroxaban  20 mg Oral Q supper  . saccharomyces boulardii  500 mg Oral BID  . scopolamine  1 patch Transdermal Q72H   Continuous Infusions: . sodium chloride 1,000 mL (08/05/18 1425)  . 0.9 % NaCl  with KCl 20 mEq / L 100 mL/hr at 08/07/18 0656  . lactated ringers Stopped (08/04/18 1320)     LOS: 6 days     Caitlin Shell, MD [facility]  If 7PM-7AM, please contact  night-coverage [email or web address] Password [facility] 08/07/2018, 10:26 AM

## 2018-08-07 NOTE — Progress Notes (Addendum)
CBG (1703) = 64, RN offered 8 oz orange juice, Pt refused, pt ate ice cream on dinner tray instead. CBG (1828) = 81. Will continue to monitor

## 2018-08-07 NOTE — Evaluation (Signed)
Physical Therapy Evaluation Patient Details Name: [name] MRN: [id number] DOB: [birth year] Today's Date: 08/07/2018   History of Present Illness  73 y.o. female with medical history significant of diabetes, hypertension, coronary artery disease, GERD, vertigo, scoliosis, hyperlipidemia, asthma and chronic anticoagulation with Xarelto. Pt admitted with 1 week of persistent diarrhea with some nausea with syncope and acute enteritis  Clinical Impression  Pt pleasant supine on arrival with cues and increased time for transfers and gait. Pt reports feeling weak and sluggish with decreased activity tolerance, balance and strength who will benefit from acute therapy to maximize mobility and independence with RW for return home. Pt encouraged to mobilize acutely and walk daily with nursing.     Follow Up Recommendations Home health PT    Equipment Recommendations  Rolling walker with 5" wheels    Recommendations for Other Services       Precautions / Restrictions Precautions Precautions: Fall Restrictions Weight Bearing Restrictions: No      Mobility  Bed Mobility Overal bed mobility: Needs Assistance Bed Mobility: Supine to Sit     Supine to sit: Min guard     General bed mobility comments: HOB flat with cues for sequence and need for one hand on rail  Transfers Overall transfer level: Needs assistance   Transfers: Sit to/from Stand Sit to Stand: Supervision         General transfer comment: supervision for lines.  Ambulation/Gait Ambulation/Gait assistance: Supervision Gait Distance (Feet): 350 Feet Assistive device: Rolling walker (2 wheeled) Gait Pattern/deviations: Step-through pattern;Decreased stride length;Trunk flexed   Gait velocity interpretation: <1.8 ft/sec, indicate of risk for recurrent falls General Gait Details: cues for posture and position in RW, very slow cautious gait with reliance on RW. Pt initiated gait without AD but reaching for  environmental support and utilized RW after 10'  Stairs            Wheelchair Mobility    Modified Rankin (Stroke Patients Only)       Balance Overall balance assessment: Needs assistance   Sitting balance-Leahy Scale: Good       Standing balance-Leahy Scale: Fair                               Pertinent Vitals/Pain Pain Assessment: No/denies pain    Home Living Family/patient expects to be discharged to:: Private residence Living Arrangements: Alone   Type of Home: House       Home Layout: One level Home Equipment: None      Prior Function Level of Independence: Independent               Hand Dominance        Extremity/Trunk Assessment   Upper Extremity Assessment Upper Extremity Assessment: Generalized weakness    Lower Extremity Assessment Lower Extremity Assessment: Generalized weakness    Cervical / Trunk Assessment Cervical / Trunk Assessment: Normal  Communication   Communication: No difficulties  Cognition Arousal/Alertness: Awake/alert Behavior During Therapy: Flat affect Overall Cognitive Status: Within Functional Limits for tasks assessed                                        General Comments      Exercises     Assessment/Plan    PT Assessment Patient needs continued PT services  PT Problem List Decreased strength;Decreased mobility;Decreased activity  tolerance;Decreased balance;Decreased knowledge of use of DME       PT Treatment Interventions Gait training;Therapeutic exercise;Patient/family education;Functional mobility training;DME instruction;Therapeutic activities    PT Goals (Current goals can be found in the Care Plan section)  Acute Rehab PT Goals Patient Stated Goal: return home PT Goal Formulation: With patient Time For Goal Achievement: 08/21/18 Potential to Achieve Goals: Good    Frequency Min 3X/week   Barriers to discharge Decreased caregiver support       Co-evaluation               AM-PAC PT "6 Clicks" Mobility  Outcome Measure Help needed turning from your back to your side while in a flat bed without using bedrails?: A Little Help needed moving from lying on your back to sitting on the side of a flat bed without using bedrails?: A Little Help needed moving to and from a bed to a chair (including a wheelchair)?: A Little Help needed standing up from a chair using your arms (e.g., wheelchair or bedside chair)?: A Little Help needed to walk in hospital room?: A Little Help needed climbing 3-5 steps with a railing? : A Little 6 Click Score: 18    End of Session Equipment Utilized During Treatment: Gait belt Activity Tolerance: Patient tolerated treatment well Patient left: in chair;with chair alarm set;with call bell/phone within reach Nurse Communication: Mobility status PT Visit Diagnosis: Other abnormalities of gait and mobility (R26.89);Muscle weakness (generalized) (M62.81)    Time: 6578-4696 PT Time Calculation (min) (ACUTE ONLY): 24 min   Charges:   PT Evaluation $PT Eval Moderate Complexity: 1 Mod PT Treatments $Gait Training: 8-22 mins        Billye Pickerel Pam Drown, PT Acute Rehabilitation Services Pager: [phone] Office: [place] 08/07/2018, 12:39 PM

## 2018-08-07 NOTE — Plan of Care (Signed)
  Problem: Education: Goal: Knowledge of General Education information will improve Description Including pain rating scale, medication(s)/side effects and non-pharmacologic comfort measures Outcome: Progressing   

## 2018-08-07 NOTE — TOC Initial Note (Signed)
Transition of Care [facility]) - Initial/Assessment Note    Patient Details  Name: [name] MRN: [id number] Date of Birth: 07-Jul-1945  [facility] of [facility]) CM/SW Contact:    Bartholomew Crews, RN Phone Number: [phone] 08/07/2018, 5:24 PM  Clinical Narrative:                 Spoke with patient at bedside. PTA home alone. Discussed choice for [facility] PT - patient agreeable to [facility], but needed time to make a decision. Agreed to RW and 3N1 from [employer]. Referral made. Patient will need HH PT order with Face to Face and DME orders for rolling walker and 3N1.  States that her daughter or a friend will pick her up at discharge. CM to follow for transition of care needs.   Expected Discharge Plan: [place] Barriers to Discharge: Continued Medical Work up   Patient Goals and CMS Choice   CMS Medicare.gov Compare Post Acute Care list provided to:: Patient Choice offered to / list presented to : Patient  Expected Discharge Plan and Services Expected Discharge Plan: [place] In-house Referral: NA Discharge Planning Services: CM Consult Post Acute Care Choice: Home Health, Durable Medical Equipment Living arrangements for the past 2 months: Single Family Home                 DME Arranged: 3-N-1, Walker rolling DME Agency: [employer] Date DME Agency Contacted: 08/07/18 Time DME Agency Contacted: [phone] Representative spoke with at DME Agency: Thedore Mins HH Arranged: RN, PT [place] Agency: [place] ([place]) Date [place]: 08/07/18 Time [place]: 83 Representative spoke with at [place] Arrangements/Services Living arrangements for the past 2 months: [place] with:: Self Patient language and need for interpreter reviewed:: Yes              Criminal Activity/Legal Involvement Pertinent to Current Situation/Hospitalization: No - Comment as needed  Activities of Daily Living Home  Assistive Devices/Equipment: Blood pressure cuff, Eyeglasses, Scales, Stimulators ADL Screening (condition at time of admission) Patient's cognitive ability adequate to safely complete daily activities?: Yes Is the patient deaf or have difficulty hearing?: No Does the patient have difficulty seeing, even when wearing glasses/contacts?: No Does the patient have difficulty concentrating, remembering, or making decisions?: No Patient able to express need for assistance with ADLs?: Yes Does the patient have difficulty dressing or bathing?: No Independently performs ADLs?: Yes (appropriate for developmental age) Does the patient have difficulty walking or climbing stairs?: No(no due to weakness) Weakness of Legs: None Weakness of Arms/Hands: None  Permission Sought/Granted                  Emotional Assessment Appearance:: Appears stated age Attitude/Demeanor/Rapport: Engaged Affect (typically observed): Accepting Orientation: : Oriented to Self, Oriented to Place, Oriented to  Time, Oriented to Situation      Admission diagnosis:  Hypomagnesemia [E83.42] AKI (acute kidney injury) ([place]) [N17.9] Diarrhea, unspecified type [R19.7] Patient Active Problem List   Diagnosis Date Noted  . Acute ischemic enteritis ([place]) 08/01/2018  . Syncope 08/01/2018  . Hypokalemia 08/01/2018  . Abnormal CT scan, pelvis 07/31/2018  . Pneumothorax 03/14/2018  . S/P bronchoscopy with biopsy   . Lung abnormality 01/11/2018  . Pulmonary embolism ([place]) 12/24/2017  . Lung mass 12/24/2017  . Small vessel disease, cerebrovascular 07/31/2017  . Atypical chest pain 06/14/2017  . Dizziness 06/14/2017  . Easy bruising 05/30/2017  .  Hyperuricemia 05/03/2017  . Arthralgia 05/01/2017  . Hair loss 05/01/2017  . Vitamin D deficiency 10/31/2016  . Redness of skin, feet 10/31/2016  . Diarrhea 06/06/2016  . Diastolic dysfunction 47/34/0370  . Vertigo 12/24/2015  . Fatigue 12/24/2015  . Nonspecific abnormal  electrocardiogram (ECG) (EKG) 12/24/2015  . Bilateral carotid artery disease, Mild 05/31/2015  . Thyroid nodule 05/31/2015  . Psoriasis   . Scoliosis   . Diabetes type 2, controlled ([place]) 06/03/2008  . Dyslipidemia 06/03/2008  . CARPAL TUNNEL SYNDROME 06/03/2008  . HTN (hypertension) 06/03/2008  . ALLERGIC RHINITIS 06/03/2008  . Asthma 06/03/2008  . GERD (gastroesophageal reflux disease) 06/03/2008  . Osteopenia 06/03/2008  . COLONIC POLYPS, HX OF 06/03/2008   PCP:  [name], MD Pharmacy:   [facility], [place] [address] Phone: [phone] Fax: [phone]  [id number] - [place], [place]. AT [address] [place]. [place] [address] Phone: [phone] Fax: [phone]     Social Determinants of Health (SDOH) Interventions    Readmission Risk Interventions No flowsheet data found.

## 2018-08-08 DIAGNOSIS — E785 Hyperlipidemia, unspecified: Secondary | ICD-10-CM

## 2018-08-08 DIAGNOSIS — E876 Hypokalemia: Secondary | ICD-10-CM

## 2018-08-08 DIAGNOSIS — I5189 Other ill-defined heart diseases: Secondary | ICD-10-CM

## 2018-08-08 DIAGNOSIS — I10 Essential (primary) hypertension: Secondary | ICD-10-CM

## 2018-08-08 DIAGNOSIS — Z86711 Personal history of pulmonary embolism: Secondary | ICD-10-CM

## 2018-08-08 DIAGNOSIS — R55 Syncope and collapse: Secondary | ICD-10-CM

## 2018-08-08 DIAGNOSIS — E118 Type 2 diabetes mellitus with unspecified complications: Secondary | ICD-10-CM

## 2018-08-08 LAB — BASIC METABOLIC PANEL
Anion gap: 5 (ref 5–15)
BUN: <5 mg/dL — ABNORMAL LOW (ref 8–23)
CO2: 20 mmol/L — ABNORMAL LOW (ref 22–32)
Calcium: 8.7 mg/dL — ABNORMAL LOW (ref 8.9–10.3)
Chloride: 116 mmol/L — ABNORMAL HIGH (ref 98–111)
Creatinine, Ser: 0.96 mg/dL (ref 0.44–1.00)
GFR calc Af Amer: >60 mL/min (ref >60)
GFR calc non Af Amer: 58 mL/min — ABNORMAL LOW (ref >60)
Glucose, Bld: 88 mg/dL (ref 70–99)
Potassium: 3.8 mmol/L (ref 3.5–5.1)
Sodium: 141 mmol/L (ref 135–145)

## 2018-08-08 LAB — GLUCOSE, CAPILLARY
Glucose-Capillary: 75 mg/dL (ref 70–99)
Glucose-Capillary: 103 mg/dL — ABNORMAL HIGH (ref 70–99)
Glucose-Capillary: 87 mg/dL (ref 70–99)
Glucose-Capillary: 73 mg/dL (ref 70–99)

## 2018-08-08 MED ORDER — SODIUM CHLORIDE 0.9 % IV SOLN
INTRAVENOUS | Status: DC
  Administered 2018-08-08: 18:00:00 via INTRAVENOUS

## 2018-08-08 NOTE — Progress Notes (Signed)
Subjective: Since I last evaluated the patient, there has not been much change in her overall condition.  She continues to have diarrhea with 4-5 bowel movements per day she denies having any nausea vomiting.  There is no history of melena hematochezia she is concerned about the green color of her stools and has multiple questions as to what her diagnosis could be.  She is wondering why her antihypertensives have not been given to her and claims that none of the interventions we have tried have helped her with her symptoms. Dr. Benson Norway had suggested cholestyramine but she could not even keep one dose down.  She continues to drink beverages with high fructose corn syrup and artificial sweeteners that can cause osmotic diarrhea in spite of my repeated efforts to tell her not to do so.  I had advised her to try Gatorade but she says she cannot drink that and cannot keep it down.  She is questioning whether she would be discharged before diagnosis is made.  Objective: Vital signs in last 24 hours: Temp:  [97.7 F (36.5 C)-98.4 F (36.9 C)] 97.7 F (36.5 C) (06/24 1149) Pulse Rate:  [57-64] 58 (06/24 1149) Resp:  [18] 18 (06/24 1149) BP: (127-156)/(65-75) 148/65 (06/24 1149) SpO2:  [98 %-100 %] 100 % (06/24 1149) Weight:  [65.6 kg] 65.6 kg (06/24 0610) Last BM Date: 08/08/18  Intake/Output from previous day: 06/23 0701 - 06/24 0700 In: 3549.8 [P.O.:1600; I.V.:1949.8] Out: 1200 [Urine:900; Stool:300] Intake/Output this shift: Total I/O In: 120 [P.O.:120] Out: 1 [Stool:1]  General appearance: alert, cooperative, appears stated age, no distress and pale Resp: clear to auscultation bilaterally Cardio: regular rate and rhythm, S1, S2 normal, no murmur, click, rub or gallop GI: soft, non-tender; bowel sounds are hyperactive, normal; no masses,  no organomegaly Extremities: extremities normal, atraumatic, no cyanosis or edema  Lab Results: No results for input(s): WBC, HGB, HCT, PLT in the last 72  hours. BMET Recent Labs    08/06/18 0615 08/07/18 0537 08/08/18 0626  NA 138 139 141  K 3.8 3.8 3.8  CL 117* 115* 116*  CO2 14* 17* 20*  GLUCOSE 108* 82 88  BUN <5* <5* <5*  CREATININE 1.03* 1.01* 0.96  CALCIUM 8.2* 8.3* 8.7*   Studies/Results: No results found.  Medications: I have reviewed the patient's current medications.  Assessment/Plan: 1) Persistent diarrhea with no evidence of melena hematochezia-plans are to do a repeat flex sig with mucosal biopsies tomorrow. It is very important for the patient to avoid all artificial sugars including high fructose corn syrup at this time as these may be worsening her symptoms.  As her stool studies are all negative mucosal biopsies will be done to rule out microscopic colitis further recommendations made as needed. 2) GERD/Hiatal hernia/Fatty liver. 3) History of PE on Xarelto. 4) HTN . LOS: 7 days   Juanita Craver 08/08/2018, 1:32 PM

## 2018-08-08 NOTE — H&P (View-Only) (Signed)
Subjective: Since I last evaluated the patient, there has not been much change in her overall condition.  She continues to have diarrhea with 4-5 bowel movements per day she denies having any nausea vomiting.  There is no history of melena hematochezia she is concerned about the green color of her stools and has multiple questions as to what her diagnosis could be.  She is wondering why her antihypertensives have not been given to her and claims that none of the interventions we have tried have helped her with her symptoms. Dr. Benson Norway had suggested cholestyramine but she could not even keep one dose down.  She continues to drink beverages with high fructose corn syrup and artificial sweeteners that can cause osmotic diarrhea in spite of my repeated efforts to tell her not to do so.  I had advised her to try Gatorade but she says she cannot drink that and cannot keep it down.  She is questioning whether she would be discharged before diagnosis is made.  Objective: Vital signs in last 24 hours: Temp:  [97.7 F (36.5 C)-98.4 F (36.9 C)] 97.7 F (36.5 C) (06/24 1149) Pulse Rate:  [57-64] 58 (06/24 1149) Resp:  [18] 18 (06/24 1149) BP: (127-156)/(65-75) 148/65 (06/24 1149) SpO2:  [98 %-100 %] 100 % (06/24 1149) Weight:  [65.6 kg] 65.6 kg (06/24 0610) Last BM Date: 08/08/18  Intake/Output from previous day: 06/23 0701 - 06/24 0700 In: 3549.8 [P.O.:1600; I.V.:1949.8] Out: 1200 [Urine:900; Stool:300] Intake/Output this shift: Total I/O In: 120 [P.O.:120] Out: 1 [Stool:1]  General appearance: alert, cooperative, appears stated age, no distress and pale Resp: clear to auscultation bilaterally Cardio: regular rate and rhythm, S1, S2 normal, no murmur, click, rub or gallop GI: soft, non-tender; bowel sounds are hyperactive, normal; no masses,  no organomegaly Extremities: extremities normal, atraumatic, no cyanosis or edema  Lab Results: No results for input(s): WBC, HGB, HCT, PLT in the last 72  hours. BMET Recent Labs    08/06/18 0615 08/07/18 0537 08/08/18 0626  NA 138 139 141  K 3.8 3.8 3.8  CL 117* 115* 116*  CO2 14* 17* 20*  GLUCOSE 108* 82 88  BUN <5* <5* <5*  CREATININE 1.03* 1.01* 0.96  CALCIUM 8.2* 8.3* 8.7*   Studies/Results: No results found.  Medications: I have reviewed the patient's current medications.  Assessment/Plan: 1) Persistent diarrhea with no evidence of melena hematochezia-plans are to do a repeat flex sig with mucosal biopsies tomorrow. It is very important for the patient to avoid all artificial sugars including high fructose corn syrup at this time as these may be worsening her symptoms.  As her stool studies are all negative mucosal biopsies will be done to rule out microscopic colitis further recommendations made as needed. 2) GERD/Hiatal hernia/Fatty liver. 3) History of PE on Xarelto. 4) HTN . LOS: 7 days   [name] 08/08/2018, 1:32 PM

## 2018-08-08 NOTE — TOC Progression Note (Signed)
Transition of Care [facility]) - Progression Note    Patient Details  Name: [name] MRN: [id number] Date of Birth: 08-11-1945  Transition of Care [facility]) CM/SW Contact  Bartholomew Crews, RN Phone Number: [phone] 08/08/2018, 4:34 PM  Clinical Narrative:    Spoke with patient at the bedside. Discussed choice list. Referral made to [facility] - pending. Patient states she is scheduled for sigmoid again tomorrow, and is hoping to feel better before discharging home. CM to follow for transition of care needs.    Expected Discharge Plan: [place] Barriers to Discharge: Continued Medical Work up  Expected Discharge Plan and Services Expected Discharge Plan: [place] In-house Referral: NA Discharge Planning Services: CM Consult Post Acute Care Choice: Home Health, Durable Medical Equipment Living arrangements for the past 2 months: Single Family Home                 DME Arranged: 3-N-1, [place] rolling DME Agency: [employer] Date DME Agency Contacted: 08/07/18 Time DME Agency Contacted: [phone] Representative spoke with at DME Agency: [place]: RN, PT [place] Agency: [place] ([place]) Date [place]: 08/07/18 Time [place]: 1610 Representative spoke with at [place]: [place] ([place]) Interventions    Readmission Risk Interventions No flowsheet data found.

## 2018-08-08 NOTE — TOC Progression Note (Signed)
Transition of Care [facility]) - Progression Note    Patient Details  Name: FRONIA DEPASS MRN: [id number] Date of Birth: Aug 18, 1945  Transition of Care [facility]) CM/SW Contact  Bartholomew Crews, RN Phone Number: [phone] 08/08/2018, 5:01 PM  Clinical Narrative:    Alvis Lemmings accepted referral for [facility] PT. Patient will need HH PT order with [facility] at discharge, and will need DME orders for RW and 3N1. [employer] to provide RW and 3N1 once orders in place. CM to continue to follow for transition of care needs.    Expected Discharge Plan: [place] Barriers to Discharge: Continued Medical Work up  Expected Discharge Plan and Services Expected Discharge Plan: [place] In-house Referral: NA Discharge Planning Services: CM Consult Post Acute Care Choice: [place] arrangements for the past 2 months: Single Family Home                 DME Arranged: 3-N-1, [place] DME Agency: [employer] Date DME Agency Contacted: 08/07/18 Time DME Agency Contacted: [phone] Representative spoke with at DME Agency: [place]: PT [place]: [place] Date [place]: 08/08/18 Time [place]: [phone] Representative spoke with at [place]: [place] ([place]) Interventions    Readmission Risk Interventions No flowsheet data found.

## 2018-08-08 NOTE — Progress Notes (Signed)
PROGRESS NOTE    [name]  ZHG:[id number] DOB: 05-16-1945 DOA: 08/01/2018 PCP: [name], MD   Brief Narrative:  73 y.o. WF PMHx DM Type 2 controlled with complication, HTN, CAD, HLD GERD, vertigo, scoliosis, asthma and chronic anticoagulation with Xarelto   Presented with 1 week of persistent diarrhea with some nausea.  Patient reported up to 8-10 stools a day.  Associated with persistent nausea and decreased oral intake.  She has been trying to keep up with fluids.  Denied any fever, denied any sick contact.  Denies eating outside the house.  No canned foods.  Patient has been trying to handle it at home not going out of her house at all.  Today however she was going to the bathroom when she felt dizzy weak and passed out.  She therefore decided to come to the ER.  She was found to be orthostatic here with signs of dehydration so patient is being admitted for work-up.  Patient reported seeing streaks of blood in her stool today..     Subjective: A/O x4, negative CP, negative S OB, positive mild nausea (better than yesterday), negative vomiting.   Assessment & Plan:   Principal Problem:   Acute ischemic enteritis (HCC) Active Problems:   Diabetes type 2, controlled ([place])   Dyslipidemia   HTN (hypertension)   GERD (gastroesophageal reflux disease)   Vertigo   Diastolic dysfunction   Pulmonary embolism (HCC)   Syncope   Hypokalemia   #1 acute enteritis: Patient admitted with nausea vomiting diarrhea.  She was  started on Cipro and Flagyl.  Stool studies including C. difficile and GI panel negative.  She continues to be nauseous and having 8-10 stools daily.  Lactoferrin positive.  She is still on IV fluids and unable to tolerate p.o. intake.   FOBT positive x2 on Xarelto no evidence of active bleeding monitor closely.  Patient needs ongoing medical care due to persistent nausea and diarrhea unable to tolerate p.o. intake in spite of Phenergan and Zofran.dc phenergan it  made her weak Scopolamine patch ordered S/p  flex sig-mild diverticulosis left colon,internal hemorrhoids S/p egd gastiris and polyps added pepcid ppi increased to bid not biopsied as she is still on xarelto GI starting patient on dicyclomine  -I will stop her Cipro and Flagyl 08/06/2018 Flagyl must be worsening her nausea. -Changed Imodium to standing order -She feels her nausea is better however she has not had real food continues to have multiple episodes of diarrhea. -She was started on rifaximin 6/22 see if it makes any improvement in her symptoms. -Continue symptomatic treatment for now  -PT consulted 6/23 -Patient reports she does not want to go to rehab nor does she want to go home like this because of weakness.    #2 syncope: Most likely due to orthostasis and dehydration.  Has not had any syncopal episodes during the hospital stay.  Any IV fluids since she is not taking p.o.   Diabetes type 2 controlled with complication - 1/96 hemoglobin A1c= 6.4   #4 hypertension: Increase Bystolic to 5 mg daily    #5 history of pulmonary embolism: On chronic Xarelto.  Continue treatment   #6 hypokalemia/hypomagnasemia: Most likely secondary to diarrhea and vomiting.  Replete potassium and mag.   #7history of vertigo: No dizziness at the moment.  Symptoms seem to be more due to dehydration.  Continue to monitor   #8 hyperlipidemia: Resume home regimen when stable.   #9 coronary artery disease: Stable.  No chest pain.  No evidence of worsening symptoms.   #10 GERD: Continue PPIs.    DVT prophylaxis: Xarelto Code Status: Full Family Communication: None Disposition Plan: TBD   Consultants:  GI Dr. Carol Ada    Procedures/Significant Events:  6/20 flexible sigmoidoscopy: Diverticulosis left colon, internal hemorrhoids 6/20 upper GI:- Patchy mild inflammation characterized by erythema and granularity was found in the gastric body.Multiple 3 to 5 mm sessile polyps with no  bleeding and no stigmata of      recent bleeding were found in the gastric body, appearance typcial of benign fundic gland polyps. -A small hiatal hernia  -single 4 mm sessile polyp with no bleeding was found in the second  portion of the duodenum. - Normal esophagus.                             I have personally reviewed and interpreted all radiology studies and my findings are as above.  VENTILATOR SETTINGS: None   Cultures   Antimicrobials:    Devices    LINES / TUBES:      Continuous Infusions: . sodium chloride 1,000 mL (08/05/18 1425)  . 0.9 % NaCl with KCl 20 mEq / L 100 mL/hr at 08/08/18 0634  . lactated ringers Stopped (08/04/18 1320)     Objective: Vitals:   08/07/18 1145 08/07/18 1646 08/07/18 2030 08/08/18 0610  BP: 139/72 (!) 156/72 140/75 127/72  Pulse: 61 64 64 62  Resp: 16 18 18 18   Temp: 98.8 F (37.1 C)  98.4 F (36.9 C) 98.2 F (36.8 C)  TempSrc: Oral  Oral Oral  SpO2: 98% 98% 98% 100%  Weight:    65.6 kg  Height:        Intake/Output Summary (Last 24 hours) at 08/08/2018 4332 Last data filed at 08/08/2018 0820 Gross per 24 hour  Intake 3669.76 ml  Output 1200 ml  Net 2469.76 ml   Filed Weights   08/06/18 0439 08/07/18 0422 08/08/18 0610  Weight: 66.5 kg 66.2 kg 65.6 kg    Examination:  General: A/O x4 no acute respiratory distress Eyes: negative scleral hemorrhage, negative anisocoria, negative icterus ENT: Negative Runny nose, negative gingival bleeding, Neck:  Negative scars, masses, torticollis, lymphadenopathy, JVD Lungs: Clear to auscultation bilaterally without wheezes or crackles Cardiovascular: Regular rate and rhythm without murmur gallop or rub normal S1 and S2 Abdomen: negative abdominal pain, nondistended, positive soft, bowel sounds, no rebound, no ascites, no appreciable mass Extremities: No significant cyanosis, clubbing, or edema bilateral lower extremities Skin: Negative rashes, lesions, ulcers Psychiatric:   Negative depression, negative anxiety, negative fatigue, negative mania  Central nervous system:  Cranial nerves II through XII intact, tongue/uvula midline, all extremities muscle strength 5/5, sensation intact throughout, negative dysarthria, negative expressive aphasia, negative receptive aphasia. .     Data Reviewed: Care during the described time interval was provided by me .  I have reviewed this patient's available data, including medical history, events of note, physical examination, and all test results as part of my evaluation.   CBC: Recent Labs  Lab 08/01/18 1600 08/02/18 0440 08/03/18 0638 08/05/18 0711  WBC 7.8 5.6 6.5 8.2  NEUTROABS  --   --  3.8 5.4  HGB 12.7 11.2* 10.7* 10.5*  HCT 40.6 34.4* 33.2* 33.2*  MCV 100.0 97.5 97.6 101.2*  PLT 266 226 241 951   Basic Metabolic Panel: Recent Labs  Lab 08/01/18 1655  08/03/18 0638 08/05/18 8841  08/06/18 0615 08/07/18 0537 08/08/18 0626  NA  --    < > 143 140 138 139 141  K  --    < > 3.6 4.9 3.8 3.8 3.8  CL  --    < > 121* 120* 117* 115* 116*  CO2  --    < > 15* 10* 14* 17* 20*  GLUCOSE  --    < > 92 73 108* 82 88  BUN  --    < > 8 6* <5* <5* <5*  CREATININE  --    < > 1.24* 1.09* 1.03* 1.01* 0.96  CALCIUM  --    < > 8.6* 8.1* 8.2* 8.3* 8.7*  MG 1.0*  --  1.2* 2.0  --   --   --    < > = values in this interval not displayed.   GFR: Estimated Creatinine Clearance: 44.6 mL/min (by C-G formula based on SCr of 0.96 mg/dL). Liver Function Tests: Recent Labs  Lab 08/01/18 1600 08/02/18 0440 08/03/18 0638  AST 26 19 19   ALT 33 28 25  ALKPHOS 63 47 46  BILITOT 1.5* 1.5* 1.2  PROT 6.7 5.6* 5.3*  ALBUMIN 3.6 3.0* 2.8*   Recent Labs  Lab 08/01/18 1600 08/03/18 0714  LIPASE 32 32   No results for input(s): AMMONIA in the last 168 hours. Coagulation Profile: No results for input(s): INR, PROTIME in the last 168 hours. Cardiac Enzymes: No results for input(s): CKTOTAL, CKMB, CKMBINDEX, TROPONINI in the last  168 hours. BNP (last 3 results) No results for input(s): PROBNP in the last 8760 hours. HbA1C: No results for input(s): HGBA1C in the last 72 hours. CBG: Recent Labs  Lab 08/07/18 1703 08/07/18 1730 08/07/18 1828 08/07/18 2140 08/08/18 0608  GLUCAP 64* 65* 81 93 75   Lipid Profile: No results for input(s): CHOL, HDL, LDLCALC, TRIG, CHOLHDL, LDLDIRECT in the last 72 hours. Thyroid Function Tests: No results for input(s): TSH, T4TOTAL, FREET4, T3FREE, THYROIDAB in the last 72 hours. Anemia Panel: No results for input(s): VITAMINB12, FOLATE, FERRITIN, TIBC, IRON, RETICCTPCT in the last 72 hours. Urine analysis:    Component Value Date/Time   COLORURINE YELLOW 08/03/2018 2115   APPEARANCEUR CLEAR 08/03/2018 2115   LABSPEC 1.014 08/03/2018 2115   PHURINE 5.0 08/03/2018 2115   GLUCOSEU NEGATIVE 08/03/2018 2115   GLUCOSEU NEGATIVE 10/13/2009 0843   HGBUR SMALL (A) 08/03/2018 2115   BILIRUBINUR NEGATIVE 08/03/2018 2115   KETONESUR 20 (A) 08/03/2018 2115   PROTEINUR NEGATIVE 08/03/2018 2115   UROBILINOGEN 0.2 10/13/2009 0843   NITRITE NEGATIVE 08/03/2018 2115   LEUKOCYTESUR TRACE (A) 08/03/2018 2115   Sepsis Labs: @LABRCNTIP (procalcitonin:4,lacticidven:4)  ) Recent Results (from the past 240 hour(s))  Stool C-Diff Toxin Assay     Status: None   Collection Time: 07/31/18  4:55 PM   Specimen: Stool   STOOL  Result Value Ref Range Status   C difficile Toxins A+B, EIA Negative Negative Final  Stool Giardia/Cryptosporidium     Status: None   Collection Time: 07/31/18  4:55 PM   Specimen: Stool  Result Value Ref Range Status   MICRO NUMBER: [id number]  Final   SPECIMEN QUALITY: Adequate  Final   Source: STOOL  Final   STATUS: FINAL  Final   RESULT: Not Detected  Final   COMMENT:   Final    NOTE: Due to intermittent shedding, one negative sample does not necessarily rule out the presence of a parasitic infection.   MICRO NUMBER: [id number]  Final   SPECIMEN QUALITY: Adequate   Final   Source: STOOL  Final   STATUS: FINAL  Final   RESULT: Not Detected  Final   COMMENT:   Final    NOTE: Due to intermittent shedding, one negative sample does not necessarily rule out the presence of a parasitic infection.  Stool Culture     Status: None   Collection Time: 07/31/18  4:55 PM   Specimen: Stool  Result Value Ref Range Status   MICRO NUMBER: [id number]  Final   SPECIMEN QUALITY: Adequate  Final   SOURCE: STOOL  Final   STATUS: FINAL  Final   SHIGA RESULT: Not Detected  Final   MICRO NUMBER: [id number]  Final   SPECIMEN QUALITY: Adequate  Final   Source STOOL  Final   STATUS: FINAL  Final   CAM RESULT: No enteric Campylobacter isolated  Final   MICRO NUMBER: [id number]  Final   SPECIMEN QUALITY: Adequate  Final   SOURCE: STOOL  Final   STATUS: FINAL  Final   SS RESULT: No Salmonella or Shigella isolated  Final  Gastrointestinal Panel by PCR , Stool     Status: None   Collection Time: 08/01/18  6:50 PM   Specimen: Stool  Result Value Ref Range Status   Campylobacter species NOT DETECTED NOT DETECTED Final   Plesimonas shigelloides NOT DETECTED NOT DETECTED Final   Salmonella species NOT DETECTED NOT DETECTED Final   Yersinia enterocolitica NOT DETECTED NOT DETECTED Final   Vibrio species NOT DETECTED NOT DETECTED Final   Vibrio cholerae NOT DETECTED NOT DETECTED Final   Enteroaggregative E coli (EAEC) NOT DETECTED NOT DETECTED Final   Enteropathogenic E coli (EPEC) NOT DETECTED NOT DETECTED Final   Enterotoxigenic E coli (ETEC) NOT DETECTED NOT DETECTED Final   Shiga like toxin producing E coli (STEC) NOT DETECTED NOT DETECTED Final   Shigella/Enteroinvasive E coli (EIEC) NOT DETECTED NOT DETECTED Final   Cryptosporidium NOT DETECTED NOT DETECTED Final   Cyclospora cayetanensis NOT DETECTED NOT DETECTED Final   Entamoeba histolytica NOT DETECTED NOT DETECTED Final   Giardia lamblia NOT DETECTED NOT DETECTED Final   Adenovirus F40/41 NOT DETECTED NOT DETECTED  Final   Astrovirus NOT DETECTED NOT DETECTED Final   Norovirus GI/GII NOT DETECTED NOT DETECTED Final   Rotavirus A NOT DETECTED NOT DETECTED Final   Sapovirus (I, II, IV, and V) NOT DETECTED NOT DETECTED Final    Comment: Performed at [facility], [address] [place], [place] [address]  C Difficile Quick Screen w PCR reflex     Status: None   Collection Time: 08/01/18  6:50 PM   Specimen: Stool  Result Value Ref Range Status   C Diff antigen NEGATIVE NEGATIVE Final   C Diff toxin NEGATIVE NEGATIVE Final   C Diff interpretation No C. difficile detected.  Final    Comment: Performed at [facility], [place] [address], [place], [place] [address]  Novel Coronavirus,NAA,(SEND-OUT TO REF LAB - TAT 24-48 hrs); Hosp Order     Status: None   Collection Time: 08/01/18  7:35 PM   Specimen: Nasopharyngeal Swab; Respiratory  Result Value Ref Range Status   SARS-CoV-2, NAA NOT DETECTED NOT DETECTED Final    Comment: (NOTE) This test was developed and its performance characteristics determined by [employer]. This test has not been FDA cleared or approved. This test has been authorized by FDA under an Emergency Use Authorization (EUA). This test is only authorized  for the duration of time the declaration that circumstances exist justifying the authorization of the emergency use of in vitro diagnostic tests for detection of SARS-CoV-2 virus and/or diagnosis of COVID-19 infection under section 564(b)(1) of the Act, 21 U.S.C. 354SFK-8(L)(2), unless the authorization is terminated or revoked sooner. When diagnostic testing is negative, the possibility of a false negative result should be considered in the context of a patient's recent exposures and the presence of clinical signs and symptoms consistent with COVID-19. An individual without symptoms of COVID-19 and who is not shedding SARS-CoV-2 virus would expect to have a negative (not detected) result in this  assay. Performed  At: [facility] [address] [place], [place] [id number] Rush Farmer MD BS:[id number]    [place]  Final    Comment: Performed at [facility], [place] [address] [place], [place] [address]         Radiology Studies: No results found.      Scheduled Meds: . cholecalciferol  2,000 Units Oral QHS  . cholestyramine  4 g Oral 3 times per day  . dicyclomine  10 mg Oral TID AC & HS  . diphenoxylate-atropine  2 tablet Oral TID  . famotidine  40 mg Oral QHS  . feeding supplement (GLUCERNA SHAKE)  237 mL Oral TID BM  . insulin aspart  0-5 Units Subcutaneous QHS  . insulin aspart  0-9 Units Subcutaneous TID WC  . loperamide  4 mg Oral QID  . nebivolol  5 mg Oral Daily  . ondansetron  4 mg Oral TID AC  . pantoprazole  40 mg Oral BID AC  . pravastatin  20 mg Oral q1800  . rifaximin  550 mg Oral BID  . rivaroxaban  20 mg Oral Q supper  . saccharomyces boulardii  500 mg Oral BID  . scopolamine  1 patch Transdermal Q72H   Continuous Infusions: . sodium chloride 1,000 mL (08/05/18 1425)  . 0.9 % NaCl with KCl 20 mEq / L 100 mL/hr at 08/08/18 0634  . lactated ringers Stopped (08/04/18 1320)     LOS: 7 days   The patient is critically ill with multiple organ systems failure and requires high complexity decision making for assessment and support, frequent evaluation and titration of therapies, application of advanced monitoring technologies and extensive interpretation of multiple databases. Critical Care Time devoted to patient care services described in this note  Time spent: 40 minutes     Toneshia Coello, Geraldo Docker, MD [facility] Pager [phone]  If 7PM-7AM, please contact night-coverage [email or web address] Password [facility] 08/08/2018, 8:37 AM

## 2018-08-09 ENCOUNTER — Encounter (HOSPITAL_COMMUNITY)
Admission: EM | Disposition: A | Discharge status: Home Health | Payer: Self-pay | Source: Home / Self Care | Attending: Internal Medicine

## 2018-08-09 ENCOUNTER — Encounter (HOSPITAL_COMMUNITY): Payer: Self-pay | Admitting: *Deleted

## 2018-08-09 DIAGNOSIS — F329 Major depressive disorder, single episode, unspecified: Secondary | ICD-10-CM

## 2018-08-09 DIAGNOSIS — I2510 Atherosclerotic heart disease of native coronary artery without angina pectoris: Secondary | ICD-10-CM

## 2018-08-09 DIAGNOSIS — N179 Acute kidney failure, unspecified: Secondary | ICD-10-CM

## 2018-08-09 DIAGNOSIS — R5383 Other fatigue: Secondary | ICD-10-CM

## 2018-08-09 DIAGNOSIS — R42 Dizziness and giddiness: Secondary | ICD-10-CM

## 2018-08-09 HISTORY — PX: BIOPSY: SHX5522

## 2018-08-09 HISTORY — PX: FLEXIBLE SIGMOIDOSCOPY: SHX5431

## 2018-08-09 LAB — COMPREHENSIVE METABOLIC PANEL
ALT: 19 U/L (ref 0–44)
AST: 16 U/L (ref 15–41)
Albumin: 2.8 g/dL — ABNORMAL LOW (ref 3.5–5.0)
Alkaline Phosphatase: 48 U/L (ref 38–126)
Anion gap: 6 (ref 5–15)
BUN: <5 mg/dL — ABNORMAL LOW (ref 8–23)
CO2: 21 mmol/L — ABNORMAL LOW (ref 22–32)
Calcium: 8.5 mg/dL — ABNORMAL LOW (ref 8.9–10.3)
Chloride: 113 mmol/L — ABNORMAL HIGH (ref 98–111)
Creatinine, Ser: 0.87 mg/dL (ref 0.44–1.00)
GFR calc Af Amer: >60 mL/min (ref >60)
GFR calc non Af Amer: >60 mL/min (ref >60)
Glucose, Bld: 83 mg/dL (ref 70–99)
Potassium: 4.2 mmol/L (ref 3.5–5.1)
Sodium: 140 mmol/L (ref 135–145)
Total Bilirubin: 0.8 mg/dL (ref 0.3–1.2)
Total Protein: 5.5 g/dL — ABNORMAL LOW (ref 6.5–8.1)

## 2018-08-09 LAB — CBC
HCT: 31.4 % — ABNORMAL LOW (ref 36.0–46.0)
Hemoglobin: 10.2 g/dL — ABNORMAL LOW (ref 12.0–15.0)
MCH: 31.6 pg (ref 26.0–34.0)
MCHC: 32.5 g/dL (ref 30.0–36.0)
MCV: 97.2 fL (ref 80.0–100.0)
Platelets: 229 10*3/uL (ref 150–400)
RBC: 3.23 MIL/uL — ABNORMAL LOW (ref 3.87–5.11)
RDW: 14.6 % (ref 11.5–15.5)
WBC: 8 10*3/uL (ref 4.0–10.5)
nRBC: 0 % (ref 0.0–0.2)

## 2018-08-09 LAB — MAGNESIUM: Magnesium: 1.4 mg/dL — ABNORMAL LOW (ref 1.7–2.4)

## 2018-08-09 LAB — TSH: TSH: 2.889 u[IU]/mL (ref 0.350–4.500)

## 2018-08-09 LAB — GLUCOSE, CAPILLARY
Glucose-Capillary: 70 mg/dL (ref 70–99)
Glucose-Capillary: 70 mg/dL (ref 70–99)
Glucose-Capillary: 58 mg/dL — ABNORMAL LOW (ref 70–99)
Glucose-Capillary: 74 mg/dL (ref 70–99)
Glucose-Capillary: 128 mg/dL — ABNORMAL HIGH (ref 70–99)

## 2018-08-09 SURGERY — SIGMOIDOSCOPY, FLEXIBLE
Anesthesia: Moderate Sedation

## 2018-08-09 MED ORDER — MAGNESIUM SULFATE 2 GM/50ML IV SOLN
2.0000 g | Freq: Once | INTRAVENOUS | Status: AC
  Administered 2018-08-09: 2 g via INTRAVENOUS
  Filled 2018-08-09: qty 50

## 2018-08-09 MED ORDER — BOOST / RESOURCE BREEZE PO LIQD CUSTOM
1.0000 | Freq: Three times a day (TID) | ORAL | Status: DC
  Administered 2018-08-09 – 2018-08-10 (×3): 1 via ORAL

## 2018-08-09 MED ORDER — ADULT MULTIVITAMIN W/MINERALS CH
1.0000 | ORAL_TABLET | Freq: Every day | ORAL | Status: DC
  Administered 2018-08-09 – 2018-08-10 (×2): 1 via ORAL
  Filled 2018-08-09 (×2): qty 1

## 2018-08-09 NOTE — Progress Notes (Signed)
PROGRESS NOTE    [name]  GDJ:[id number] DOB: 03-08-1945 DOA: 08/01/2018 PCP: [name], MD   Brief Narrative:  73 y.o. WF PMHx DM Type 2 controlled with complication, HTN, CAD, HLD GERD, vertigo, scoliosis, asthma and chronic anticoagulation with Xarelto   Presented with 1 week of persistent diarrhea with some nausea.  Patient reported up to 8-10 stools a day.  Associated with persistent nausea and decreased oral intake.  She has been trying to keep up with fluids.  Denied any fever, denied any sick contact.  Denies eating outside the house.  No canned foods.  Patient has been trying to handle it at home not going out of her house at all.  Today however she was going to the bathroom when she felt dizzy weak and passed out.  She therefore decided to come to the ER.  She was found to be orthostatic here with signs of dehydration so patient is being admitted for work-up.  Patient reported seeing streaks of blood in her stool today..     Subjective: 6/25 A/O x4, negative CP, S OB.  Negative N/V.  Negative BM since 0 400.  No diarrhea.   Assessment & Plan:   Principal Problem:   Acute ischemic enteritis (HCC) Active Problems:   Diabetes type 2, controlled (HCC)   Dyslipidemia   HTN (hypertension)   GERD (gastroesophageal reflux disease)   COLONIC POLYPS, HX OF   Scoliosis   Vertigo   Fatigue   Diastolic dysfunction   Diarrhea   Pulmonary embolism (HCC)   Syncope   Hypokalemia  Acute enteritis  - Admitted with N/V/D -FOBT positive x2; patient on Xarelto no evidence of acute bleed. - Was started on Cipro and Flagyl with no improvement of symptoms - Stool studies including C. difficile GI panel negative - Currently since being n.p.o. patient decreased N/V, and only 1 BM since 0 400 earlier this morning. - Yesterday previous to n.p.o. order patient consuming significant amounts of junk food consisting of graham crackers, juice cups. -S/p 6/20 upper GI multiple small  polyps negative biopsies obtained secondary to patient being on anticoagulant see results below. - S/p 6/20 flex sig diverticulosis of left colon see results below.  Patient scheduled for repeat flex sig on 6/25 with biopsies.  Diarrhea - Cholestyramine 4 g 3 times daily -Bentyl 10 mg 3 times daily - Rifaximin 550 mg twice daily  Essential HTN - Currently BP slightly elevated however patient about to go downstairs for flex sig explained to patient she will be given sedative therefore would not add additional BP medication at this time. - Bystolic 5 mg daily  CAD - Stable  Syncope - Initially most likely secondary to dehydration -Obtain orthostatic vitals 6/26  Hx vertigo - Patient on diazepam for vertigo this is not a appropriate treatment, in fact benzodiazepines could exacerbate condition will discontinue.     Diabetes type 2 controlled with complication - 6/22 hemoglobin A1c= 6.4   HLD - Previous LDL not within ADA/AHA guideline. - LDL goal<70 - Lipid panel pending - In the a.m. restart Zetia 10 mg daily  Hx PE - Continue Xarelto 20 mg daily    Hypomagnesmia -Secondary to N/V/D - Magnesium goal> 2 - Magnesium IV 2 g   Noncompliance  - Patient the exact medication which would help with her diagnosis.  In addition also eating and drinking food which will aggravate her symptoms.     Goals of care - 6/22 PT consulted SNF recommended,  patient stated does not want to be discharged to SNF.   DVT prophylaxis: Xarelto Code Status: Full Family Communication: None Disposition Plan: TBD   Consultants:  GI Dr. Carol Ada    Procedures/Significant Events:  6/20 flexible sigmoidoscopy: Diverticulosis left colon, internal hemorrhoids 6/20 upper GI:- Patchy mild inflammation characterized by erythema and granularity was found in the gastric body.Multiple 3 to 5 mm sessile polyps with no bleeding and no stigmata of      recent bleeding were found in the gastric body,  appearance typcial of benign fundic gland polyps. -A small hiatal hernia  -single 4 mm sessile polyp with no bleeding was found in the second  portion of the duodenum. - Normal esophagus.                             I have personally reviewed and interpreted all radiology studies and my findings are as above.  VENTILATOR SETTINGS: None   Cultures 6/16 stool negative  Antimicrobials: Anti-infectives (From admission, onward)   Start     Ordered Stop   08/06/18 1415  rifaximin (XIFAXAN) tablet 550 mg     08/06/18 1403     08/05/18 1330  ciprofloxacin (CIPRO) IVPB 400 mg  Status:  Discontinued     08/05/18 1329 08/06/18 0730   08/01/18 2200  ciprofloxacin (CIPRO) IVPB 400 mg  Status:  Discontinued     08/01/18 2051 08/05/18 1329   08/01/18 2200  metroNIDAZOLE (FLAGYL) IVPB 500 mg  Status:  Discontinued     08/01/18 2051 08/06/18 0730       Devices    LINES / TUBES:      Continuous Infusions: . sodium chloride 1,000 mL (08/05/18 1425)  . sodium chloride 20 mL/hr at 08/08/18 1730  . 0.9 % NaCl with KCl 20 mEq / L 100 mL/hr at 08/09/18 0459  . lactated ringers Stopped (08/04/18 1320)  . magnesium sulfate bolus IVPB       Objective: Vitals:   08/09/18 0117 08/09/18 0356 08/09/18 0911 08/09/18 1131  BP:  129/64 (!) 148/80 (!) 147/73  Pulse:  66 (!) 57 (!) 56  Resp:   18 18  Temp:  98.2 F (36.8 C)  98.2 F (36.8 C)  TempSrc:  Oral  Oral  SpO2:  99% 97% 98%  Weight: 67.4 kg     Height:        Intake/Output Summary (Last 24 hours) at 08/09/2018 1456 Last data filed at 08/09/2018 8315 Gross per 24 hour  Intake 1978.81 ml  Output 2 ml  Net 1976.81 ml   Filed Weights   08/07/18 0422 08/08/18 0610 08/09/18 0117  Weight: 66.2 kg 65.6 kg 67.4 kg   Physical Exam:  General: A/O x4, no acute respiratory distress Eyes: negative scleral hemorrhage, negative anisocoria, negative icterus ENT: Negative Runny nose, negative gingival bleeding, Neck:  Negative scars,  masses, torticollis, lymphadenopathy, JVD Lungs: Clear to auscultation bilaterally without wheezes or crackles Cardiovascular: Regular rate and rhythm without murmur gallop or rub normal S1 and S2 Abdomen: negative abdominal pain, nondistended, positive soft, bowel sounds, no rebound, no ascites, no appreciable mass Extremities: No significant cyanosis, clubbing, or edema bilateral lower extremities Skin: Negative rashes, lesions, ulcers Psychiatric:  Negative depression, negative anxiety, negative fatigue, negative mania  Central nervous system:  Cranial nerves II through XII intact, tongue/uvula midline, all extremities muscle strength 5/5, sensation intact throughout, negative dysarthria, negative expressive aphasia, negative receptive aphasia.  Data Reviewed: Care during the described time interval was provided by me .  I have reviewed this patient's available data, including medical history, events of note, physical examination, and all test results as part of my evaluation.   CBC: Recent Labs  Lab 08/03/18 0638 08/05/18 0711 08/09/18 1025  WBC 6.5 8.2 8.0  NEUTROABS 3.8 5.4  --   HGB 10.7* 10.5* 10.2*  HCT 33.2* 33.2* 31.4*  MCV 97.6 101.2* 97.2  PLT 241 224 914   Basic Metabolic Panel: Recent Labs  Lab 08/03/18 0638 08/05/18 0711 08/06/18 0615 08/07/18 0537 08/08/18 0626 08/09/18 1025  NA 143 140 138 139 141 140  K 3.6 4.9 3.8 3.8 3.8 4.2  CL 121* 120* 117* 115* 116* 113*  CO2 15* 10* 14* 17* 20* 21*  GLUCOSE 92 73 108* 82 88 83  BUN 8 6* <5* <5* <5* <5*  CREATININE 1.24* 1.09* 1.03* 1.01* 0.96 0.87  CALCIUM 8.6* 8.1* 8.2* 8.3* 8.7* 8.5*  MG 1.2* 2.0  --   --   --  1.4*   GFR: Estimated Creatinine Clearance: 49.8 mL/min (by C-G formula based on SCr of 0.87 mg/dL). Liver Function Tests: Recent Labs  Lab 08/03/18 0638 08/09/18 1025  AST 19 16  ALT 25 19  ALKPHOS 46 48  BILITOT 1.2 0.8  PROT 5.3* 5.5*  ALBUMIN 2.8* 2.8*   Recent Labs  Lab  08/03/18 0714  LIPASE 32   No results for input(s): AMMONIA in the last 168 hours. Coagulation Profile: No results for input(s): INR, PROTIME in the last 168 hours. Cardiac Enzymes: No results for input(s): CKTOTAL, CKMB, CKMBINDEX, TROPONINI in the last 168 hours. BNP (last 3 results) No results for input(s): PROBNP in the last 8760 hours. HbA1C: No results for input(s): HGBA1C in the last 72 hours. CBG: Recent Labs  Lab 08/08/18 1102 08/08/18 1600 08/08/18 2108 08/09/18 0604 08/09/18 1104  GLUCAP 103* 87 73 70 70   Lipid Profile: No results for input(s): CHOL, HDL, LDLCALC, TRIG, CHOLHDL, LDLDIRECT in the last 72 hours. Thyroid Function Tests: Recent Labs    08/09/18 1025  TSH 2.889   Anemia Panel: No results for input(s): VITAMINB12, FOLATE, FERRITIN, TIBC, IRON, RETICCTPCT in the last 72 hours. Urine analysis:    Component Value Date/Time   COLORURINE YELLOW 08/03/2018 2115   APPEARANCEUR CLEAR 08/03/2018 2115   LABSPEC 1.014 08/03/2018 2115   PHURINE 5.0 08/03/2018 2115   GLUCOSEU NEGATIVE 08/03/2018 2115   GLUCOSEU NEGATIVE 10/13/2009 0843   HGBUR SMALL (A) 08/03/2018 2115   BILIRUBINUR NEGATIVE 08/03/2018 2115   KETONESUR 20 (A) 08/03/2018 2115   PROTEINUR NEGATIVE 08/03/2018 2115   UROBILINOGEN 0.2 10/13/2009 0843   NITRITE NEGATIVE 08/03/2018 2115   LEUKOCYTESUR TRACE (A) 08/03/2018 2115   Sepsis Labs: @LABRCNTIP (procalcitonin:4,lacticidven:4)  ) Recent Results (from the past 240 hour(s))  Stool C-Diff Toxin Assay     Status: None   Collection Time: 07/31/18  4:55 PM   Specimen: Stool   STOOL  Result Value Ref Range Status   C difficile Toxins A+B, EIA Negative Negative Final  Stool Giardia/Cryptosporidium     Status: None   Collection Time: 07/31/18  4:55 PM   Specimen: Stool  Result Value Ref Range Status   MICRO NUMBER: [id number]  Final   SPECIMEN QUALITY: Adequate  Final   Source: STOOL  Final   STATUS: FINAL  Final   RESULT: Not  Detected  Final   COMMENT:   Final    NOTE:  Due to intermittent shedding, one negative sample does not necessarily rule out the presence of a parasitic infection.   MICRO NUMBER: [id number]  Final   SPECIMEN QUALITY: Adequate  Final   Source: STOOL  Final   STATUS: FINAL  Final   RESULT: Not Detected  Final   COMMENT:   Final    NOTE: Due to intermittent shedding, one negative sample does not necessarily rule out the presence of a parasitic infection.  Stool Culture     Status: None   Collection Time: 07/31/18  4:55 PM   Specimen: Stool  Result Value Ref Range Status   MICRO NUMBER: [id number]  Final   SPECIMEN QUALITY: Adequate  Final   SOURCE: STOOL  Final   STATUS: FINAL  Final   SHIGA RESULT: Not Detected  Final   MICRO NUMBER: [id number]  Final   SPECIMEN QUALITY: Adequate  Final   Source STOOL  Final   STATUS: FINAL  Final   CAM RESULT: No enteric Campylobacter isolated  Final   MICRO NUMBER: [id number]  Final   SPECIMEN QUALITY: Adequate  Final   SOURCE: STOOL  Final   STATUS: FINAL  Final   SS RESULT: No Salmonella or Shigella isolated  Final  Gastrointestinal Panel by PCR , Stool     Status: None   Collection Time: 08/01/18  6:50 PM   Specimen: Stool  Result Value Ref Range Status   Campylobacter species NOT DETECTED NOT DETECTED Final   Plesimonas shigelloides NOT DETECTED NOT DETECTED Final   Salmonella species NOT DETECTED NOT DETECTED Final   Yersinia enterocolitica NOT DETECTED NOT DETECTED Final   Vibrio species NOT DETECTED NOT DETECTED Final   Vibrio cholerae NOT DETECTED NOT DETECTED Final   Enteroaggregative E coli (EAEC) NOT DETECTED NOT DETECTED Final   Enteropathogenic E coli (EPEC) NOT DETECTED NOT DETECTED Final   Enterotoxigenic E coli (ETEC) NOT DETECTED NOT DETECTED Final   Shiga like toxin producing E coli (STEC) NOT DETECTED NOT DETECTED Final   Shigella/Enteroinvasive E coli (EIEC) NOT DETECTED NOT DETECTED Final   Cryptosporidium NOT DETECTED NOT  DETECTED Final   Cyclospora cayetanensis NOT DETECTED NOT DETECTED Final   Entamoeba histolytica NOT DETECTED NOT DETECTED Final   Giardia lamblia NOT DETECTED NOT DETECTED Final   Adenovirus F40/41 NOT DETECTED NOT DETECTED Final   Astrovirus NOT DETECTED NOT DETECTED Final   Norovirus GI/GII NOT DETECTED NOT DETECTED Final   Rotavirus A NOT DETECTED NOT DETECTED Final   Sapovirus (I, II, IV, and V) NOT DETECTED NOT DETECTED Final    Comment: Performed at [facility], [address] [place], [place] [address]  C Difficile Quick Screen w PCR reflex     Status: None   Collection Time: 08/01/18  6:50 PM   Specimen: Stool  Result Value Ref Range Status   C Diff antigen NEGATIVE NEGATIVE Final   C Diff toxin NEGATIVE NEGATIVE Final   C Diff interpretation No C. difficile detected.  Final    Comment: Performed at [facility], [place] [address], [place], [place] [address]  Novel Coronavirus,NAA,(SEND-OUT TO REF LAB - TAT 24-48 hrs); Hosp Order     Status: None   Collection Time: 08/01/18  7:35 PM   Specimen: Nasopharyngeal Swab; Respiratory  Result Value Ref Range Status   SARS-CoV-2, NAA NOT DETECTED NOT DETECTED Final    Comment: (NOTE) This test was developed and its performance characteristics determined by [employer]. This test has not  been FDA cleared or approved. This test has been authorized by FDA under an Emergency Use Authorization (EUA). This test is only authorized for the duration of time the declaration that circumstances exist justifying the authorization of the emergency use of in vitro diagnostic tests for detection of SARS-CoV-2 virus and/or diagnosis of COVID-19 infection under section 564(b)(1) of the Act, 21 U.S.C. 256LSL-3(T)(3), unless the authorization is terminated or revoked sooner. When diagnostic testing is negative, the possibility of a false negative result should be considered in the context of a patient's recent exposures and the  presence of clinical signs and symptoms consistent with COVID-19. An individual without symptoms of COVID-19 and who is not shedding SARS-CoV-2 virus would expect to have a negative (not detected) result in this assay. Performed  At: [facility] [address] [place], [place] [id number] Rush Farmer MD BW:[id number]    [place]  Final    Comment: Performed at [facility], [place] [address], [place], [place] [address]         Radiology Studies: No results found.      Scheduled Meds: . cholecalciferol  2,000 Units Oral QHS  . cholestyramine  4 g Oral 3 times per day  . dicyclomine  10 mg Oral TID AC & HS  . diphenoxylate-atropine  2 tablet Oral TID  . famotidine  40 mg Oral QHS  . feeding supplement  1 Container Oral TID BM  . insulin aspart  0-5 Units Subcutaneous QHS  . insulin aspart  0-9 Units Subcutaneous TID WC  . multivitamin with minerals  1 tablet Oral Daily  . nebivolol  5 mg Oral Daily  . ondansetron  4 mg Oral TID AC  . pantoprazole  40 mg Oral BID AC  . pravastatin  20 mg Oral q1800  . rifaximin  550 mg Oral BID  . rivaroxaban  20 mg Oral Q supper  . saccharomyces boulardii  500 mg Oral BID  . scopolamine  1 patch Transdermal Q72H   Continuous Infusions: . sodium chloride 1,000 mL (08/05/18 1425)  . sodium chloride 20 mL/hr at 08/08/18 1730  . 0.9 % NaCl with KCl 20 mEq / L 100 mL/hr at 08/09/18 0459  . lactated ringers Stopped (08/04/18 1320)  . magnesium sulfate bolus IVPB       LOS: 8 days   The patient is critically ill with multiple organ systems failure and requires high complexity decision making for assessment and support, frequent evaluation and titration of therapies, application of advanced monitoring technologies and extensive interpretation of multiple databases. Critical Care Time devoted to patient care services described in this note  Time spent: 40 minutes     Audie Stayer, Geraldo Docker, MD [facility] Pager [phone]  If 7PM-7AM, please contact night-coverage [email or web address] Password [facility] 08/09/2018, 2:56 PM

## 2018-08-09 NOTE — Interval H&P Note (Signed)
History and Physical Interval Note:  08/09/2018 3:42 PM  [name]  has presented today for surgery, with the diagnosis of Diarrhea..  The various methods of treatment have been discussed with the patient and family. After consideration of risks, benefits and other options for treatment, the patient has consented to  Procedure(s): FLEXIBLE SIGMOIDOSCOPY (N/A) BIOPSY as a surgical intervention.  The patient's history has been reviewed, patient examined, no change in status, stable for surgery.  I have reviewed the patient's chart and labs.  Questions were answered to the patient's satisfaction.     Kiffany Schelling D

## 2018-08-09 NOTE — Progress Notes (Signed)
Spoke with unit phlebotomist regarding stat lab work. phlebotomist stated that the lab work will be done as soon as they are finished with their 5 o'clock lab work.  Will call back again to make sure they are done as soon as possible.

## 2018-08-09 NOTE — Care Management Important Message (Signed)
Important Message  Patient Details  Name: [name] MRN: [id number] Date of Birth: [birth year]   Medicare Important Message Given:  Yes     [name] 08/09/2018, 1:19 PM

## 2018-08-09 NOTE — Progress Notes (Addendum)
Nutrition Follow-up  RD working remotely.  DOCUMENTATION CODES:   Not applicable  INTERVENTION:   -D/c Glucerna TID, due to poor acceptance -Continue Magic cup TID with meals, each supplement provides 290 kcal and 9 grams of protein -Boost Breeze po TID, each supplement provides 250 kcal and 9 grams of protein -MVI with minerals daily  NUTRITION DIAGNOSIS:   Inadequate oral intake related to nausea, vomiting as evidenced by per patient/family report.  Ongoing  GOAL:   Patient will meet greater than or equal to 90% of their needs  Progressing   MONITOR:   PO intake, Supplement acceptance, Labs, I & O's, Weight trends  REASON FOR ASSESSMENT:   Malnutrition Screening Tool    ASSESSMENT:   73 year old female who presented to the ED on 6/17 with nausea and diarrhea x 1 week. PMH of HLD, HTN, asthma, arthritis, T2DM, chronic rectal fissure, GERD, CAD. Pt admitted with acute enteritis.  6/20- s/p EGD and flex sig revealed mild gastritis  Reviewed I/O's: +2.7 L x 24 hours and +15.1 L since admission  Per chart review, pt remains with poor appetite, eating minimally off meal trays. Pt has had multiple hypoglycemic events (consumed only ice cream off of her tray yesterday). Noted meal completion 10-40%. Pt has been refusing Glucerna supplements.   Labs reviewed: CBGS: 70-103 (inpatient orders for glycemic control are 0-5 units insulin aspart q HS and 0-9 units insulin aspart TID with meals).   Diet Order:   Diet Order            Diet NPO time specified Except for: Sips with Meds  Diet effective now              EDUCATION NEEDS:   No education needs have been identified at this time  Skin:  Skin Assessment: Reviewed RN Assessment  Last BM:  08/09/18  Height:   Ht Readings from Last 1 Encounters:  08/04/18 5\' 1"  (1.549 m)    Weight:   Wt Readings from Last 1 Encounters:  08/09/18 67.4 kg    Ideal Body Weight:  47.7 kg  BMI:  Body mass index is 28.08  kg/m.  Estimated Nutritional Needs:   Kcal:  1450-1650  Protein:  70-85 grams  Fluid:  >/= 1.5 L    Geremy Rister A. Jimmye Norman, RD, LDN, [place] Registered Dietitian II Certified Diabetes Care and Education Specialist Pager: [phone] After hours Pager: [phone]

## 2018-08-09 NOTE — Op Note (Signed)
[facility] Patient Name: [name] Procedure Date : 08/09/2018 MRN: [id number] Attending MD: [name] , MD Date of Birth: [birth year] CSN: [id number] Age: 73 Admit Type: Inpatient Procedure:                Flexible Sigmoidoscopy Indications:              Diarrhea Providers:                Caitlin Ada, MD, Caitlin Reichert, RN, [name],                            Technician Referring MD:              Medicines:                None Complications:            No immediate complications. Estimated Blood Loss:     Estimated blood loss was minimal. Procedure:                Pre-Anesthesia Assessment:                           - Prior to the procedure, a History and Physical                            was performed, and patient medications and                            allergies were reviewed. The patient's tolerance of                            previous anesthesia was also reviewed. The risks                            and benefits of the procedure and the sedation                            options and risks were discussed with the patient.                            All questions were answered, and informed consent                            was obtained. Prior Anticoagulants: The patient has                            taken Xarelto (rivaroxaban), last dose was 1 day                            prior to procedure. ASA Grade Assessment: III - A                            patient with severe systemic disease. After                            reviewing the  risks and benefits, the patient was                            deemed in satisfactory condition to undergo the                            procedure.                           - No sedation medications were administered.                           After obtaining informed consent, the scope was                            passed under direct vision. The GIF-H190 ([id number])                            Olympus  gastroscope was introduced through the anus                            and advanced to the the sigmoid colon. The flexible                            sigmoidoscopy was accomplished without difficulty.                            The patient tolerated the procedure well. The                            quality of the bowel preparation was adequate. Scope In: Scope Out: Findings:      The entire examined colon appeared normal. Biopsies for histology were       taken with a cold forceps from the sigmoid colon and rectum for       evaluation of microscopic colitis.      Compared to the unprepped Shafter last Saturday and today's unprepped FFS,       she now demonstrated solid stool. The patient also reported her last       bowel movement was at 4 AM today. Even though solid stool was       encountered, with the severity of her diarrhea, that required       hospitalization, mucosal biopsies were obtained. Impression:               - The entire examined colon is normal. Biopsied. Recommendation:           - Return patient to hospital ward for ongoing care.                           - Resume regular diet.                           - Await pathology results.                           - Continue with current treatment.                           -  If she does not have any further diarrhea or                            significant diarrhea tonight, she can be discharged                            home from the GI standpoint.                           - Follow up with Dr. Collene Rios in 2 weeks upon discharge. Procedure Code(s):        --- Professional ---                           [phone], Sigmoidoscopy, flexible; with biopsy, single                            or multiple Diagnosis Code(s):        --- Professional ---                           R19.7, Diarrhea, unspecified CPT copyright 2019 [employer] Medical [employer]. All rights reserved. The codes documented in this report are preliminary and upon coder  review may  be revised to meet current compliance requirements. Caitlin Ada, MD Caitlin Ada, MD 08/09/2018 3:51:02 PM This report has been signed electronically. Number of Addenda: 0

## 2018-08-09 NOTE — Progress Notes (Signed)
Physical Therapy Treatment Patient Details Name: [name] MRN: [id number] DOB: [birth year] Today's Date: 08/09/2018    History of Present Illness 73 y.o. female with medical history significant of diabetes, hypertension, coronary artery disease, GERD, vertigo, scoliosis, hyperlipidemia, asthma and chronic anticoagulation with Xarelto. Pt admitted with 1 week of persistent diarrhea with some nausea with syncope and acute enteritis    PT Comments    Pt very pleasant and reports feeling slightly stronger today despite continued bouts of diarrhea, NPO and awaiting further testing. Pt with improved gait speed and tolerance with education for HEP and walking program. Pt again encouraged to walk with nursing as she states she only got to [facility] yesterday.     Follow Up Recommendations  Home health PT     Equipment Recommendations  Rolling walker with 5" wheels    Recommendations for Other Services       Precautions / Restrictions Precautions Precautions: Fall    Mobility  Bed Mobility Overal bed mobility: Modified Independent Bed Mobility: Supine to Sit           General bed mobility comments: HOB flat with use of rail  Transfers Overall transfer level: Needs assistance   Transfers: Sit to/from Stand Sit to Stand: Supervision         General transfer comment: supervision for lines.  Ambulation/Gait Ambulation/Gait assistance: Supervision Gait Distance (Feet): 375 Feet Assistive device: Rolling walker (2 wheeled) Gait Pattern/deviations: Step-through pattern;Decreased stride length;Trunk flexed   Gait velocity interpretation: 1.31 - 2.62 ft/sec, indicative of limited community ambulator General Gait Details: cues for posture and position in RW pt with slightly increased gait speed with maintained reliance on RW   Stairs             Wheelchair Mobility    Modified Rankin (Stroke Patients Only)       Balance Overall balance assessment: Needs  assistance   Sitting balance-Leahy Scale: Good       Standing balance-Leahy Scale: Fair Standing balance comment: able to stand without UE assist but reliant on RW for gait                            Cognition Arousal/Alertness: Awake/alert Behavior During Therapy: WFL for tasks assessed/performed Overall Cognitive Status: Within Functional Limits for tasks assessed                                        Exercises General Exercises - Lower Extremity Long Arc Quad: AROM;20 reps;Both;Seated Hip ABduction/ADduction: AROM;20 reps;Both;Seated Hip Flexion/Marching: AROM;20 reps;Both;Seated    General Comments        Pertinent Vitals/Pain Pain Assessment: No/denies pain    Home Living                      Prior Function            PT Goals (current goals can now be found in the care plan section) Progress towards PT goals: Progressing toward goals    Frequency           PT Plan      Co-evaluation              AM-PAC PT "6 Clicks" Mobility   Outcome Measure  Help needed turning from your back to your side while in a flat bed without using bedrails?: A  Little Help needed moving from lying on your back to sitting on the side of a flat bed without using bedrails?: A Little Help needed moving to and from a bed to a chair (including a wheelchair)?: A Little Help needed standing up from a chair using your arms (e.g., wheelchair or bedside chair)?: A Little Help needed to walk in hospital room?: A Little Help needed climbing 3-5 steps with a railing? : A Little 6 Click Score: 18    End of Session Equipment Utilized During Treatment: Gait belt Activity Tolerance: Patient tolerated treatment well Patient left: in chair;with call bell/phone within reach Nurse Communication: Mobility status PT Visit Diagnosis: Other abnormalities of gait and mobility (R26.89);Muscle weakness (generalized) (M62.81)     Time: 2909-0301 PT  Time Calculation (min) (ACUTE ONLY): 21 min  Charges:  $Gait Training: 8-22 mins                     [place], PT Acute Rehabilitation Services Pager: [phone] Office: [place] 08/09/2018, 1:04 PM

## 2018-08-09 NOTE — Progress Notes (Signed)
Hypoglycemic Event  CBG: 58  Treatment: 15 minute protocol. Boost drink offered with medications. Meal ordered.   Symptoms: asymptomatic  Follow-up CBG: Time:1738 CBG Result:74  Possible Reasons for Event: pt has been NPO for procedure.  Comments/MD notified:[place] MD    Julianne Handler

## 2018-08-10 DIAGNOSIS — F488 Other specified nonpsychotic mental disorders: Secondary | ICD-10-CM

## 2018-08-10 DIAGNOSIS — M4150 Other secondary scoliosis, site unspecified: Secondary | ICD-10-CM

## 2018-08-10 DIAGNOSIS — E1121 Type 2 diabetes mellitus with diabetic nephropathy: Secondary | ICD-10-CM

## 2018-08-10 LAB — BASIC METABOLIC PANEL
Anion gap: 8 (ref 5–15)
BUN: <5 mg/dL — ABNORMAL LOW (ref 8–23)
CO2: 20 mmol/L — ABNORMAL LOW (ref 22–32)
Calcium: 8.6 mg/dL — ABNORMAL LOW (ref 8.9–10.3)
Chloride: 112 mmol/L — ABNORMAL HIGH (ref 98–111)
Creatinine, Ser: 0.82 mg/dL (ref 0.44–1.00)
GFR calc Af Amer: >60 mL/min (ref >60)
GFR calc non Af Amer: >60 mL/min (ref >60)
Glucose, Bld: 86 mg/dL (ref 70–99)
Potassium: 3.8 mmol/L (ref 3.5–5.1)
Sodium: 140 mmol/L (ref 135–145)

## 2018-08-10 LAB — LIPID PANEL
Cholesterol: 124 mg/dL (ref 0–200)
HDL: 32 mg/dL — ABNORMAL LOW (ref >40)
LDL Cholesterol: 64 mg/dL (ref 0–99)
Total CHOL/HDL Ratio: 3.9 RATIO
Triglycerides: 140 mg/dL (ref <150)
VLDL: 28 mg/dL (ref 0–40)

## 2018-08-10 LAB — CBC
HCT: 33 % — ABNORMAL LOW (ref 36.0–46.0)
Hemoglobin: 10.7 g/dL — ABNORMAL LOW (ref 12.0–15.0)
MCH: 31.6 pg (ref 26.0–34.0)
MCHC: 32.4 g/dL (ref 30.0–36.0)
MCV: 97.3 fL (ref 80.0–100.0)
Platelets: 235 10*3/uL (ref 150–400)
RBC: 3.39 MIL/uL — ABNORMAL LOW (ref 3.87–5.11)
RDW: 14.6 % (ref 11.5–15.5)
WBC: 7.4 10*3/uL (ref 4.0–10.5)
nRBC: 0 % (ref 0.0–0.2)

## 2018-08-10 LAB — MAGNESIUM: Magnesium: 1.7 mg/dL (ref 1.7–2.4)

## 2018-08-10 LAB — GLUCOSE, CAPILLARY: Glucose-Capillary: 87 mg/dL (ref 70–99)

## 2018-08-10 MED ORDER — MAGNESIUM SULFATE 2 GM/50ML IV SOLN
2.0000 g | Freq: Once | INTRAVENOUS | Status: AC
  Administered 2018-08-10: 2 g via INTRAVENOUS
  Filled 2018-08-10: qty 50

## 2018-08-10 MED ORDER — PRAVASTATIN SODIUM 20 MG PO TABS: 20.0000 mg | ORAL_TABLET | Freq: Every day | ORAL | 0 refills | Status: DC

## 2018-08-10 MED ORDER — FAMOTIDINE 40 MG PO TABS: 40.0000 mg | ORAL_TABLET | Freq: Every day | ORAL | 0 refills | Status: DC

## 2018-08-10 MED ORDER — DIPHENOXYLATE-ATROPINE 2.5-0.025 MG PO TABS: 2.0000 | ORAL_TABLET | Freq: Three times a day (TID) | ORAL | 0 refills | Status: DC

## 2018-08-10 MED ORDER — SCOPOLAMINE 1 MG/3DAYS TD PT72: 1.0000 | MEDICATED_PATCH | TRANSDERMAL | 0 refills | Status: DC

## 2018-08-10 MED ORDER — NEBIVOLOL HCL 5 MG PO TABS: 5.0000 mg | ORAL_TABLET | Freq: Every day | ORAL | 0 refills | Status: DC

## 2018-08-10 MED ORDER — CHOLESTYRAMINE 4 G PO PACK: 4.0000 g | PACK | Freq: Three times a day (TID) | ORAL | 0 refills | Status: DC

## 2018-08-10 MED FILL — BYSTOLIC 5 MG TABLET: 5 | 30 days supply | Qty: 30 | Fill #0

## 2018-08-10 MED FILL — SCOPOLAMINE 1 MG/3DAYS PT72: 1 | 3 days supply | Qty: 1 | Fill #0

## 2018-08-10 MED FILL — CHOLESTYRAMINE PACKET: 4 | 20 days supply | Qty: 60 | Fill #0

## 2018-08-10 MED FILL — PRAVASTATIN NA 20 MG TAB: 20 | 30 days supply | Qty: 30 | Fill #0

## 2018-08-10 MED FILL — SM ACID REDUCER 20 MG TAB: 20 | 31 days supply | Qty: 75 | Fill #0

## 2018-08-10 NOTE — Discharge Summary (Signed)
Physician Discharge Summary  [name] MVH:[id number] DOB: 1945-08-30 DOA: 08/01/2018  PCP: [name], MD  Admit date: 08/01/2018 Discharge date: 08/10/2018  Time spent: 30 minutes  Recommendations for Outpatient Follow-up:   Acute enteritis  - Admitted with N/V/D -FOBT positive x2; patient on Xarelto no evidence of acute bleed. - Was started on Cipro and Flagyl with no improvement of symptoms - Stool studies including C. difficile GI panel negative -  Since becoming n.p.o. and discontinuing significant amounts of junk food consisting of graham crackers, juice cups, negative NV/D. -S/p 6/20 upper GI multiple small polyps negative biopsies obtained secondary to patient being on anticoagulant see results below. - S/p 6/20 flex sig diverticulosis of left colon see results below.  -S/P 6/ 20 5 repeat flex sig with biopsies.  - Schedule follow-up appointment with Dr. Juanita Craver in 2 weeks to review pathology results  Diarrhea - Cholestyramine 4 g 3 times daily -Lomotil 2.5--0.067m/tab, 2 tablets p.o. 3 times daily  Essential HTN -Start home medication  CAD - Stable  Syncope - Initially most likely secondary to dehydration  Hx vertigo - Patient on diazepam for vertigo this is not a appropriate treatment, in fact benzodiazepines could exacerbate condition will discontinue.    Diabetes type 2 controlled with complication - 38/41hemoglobin A1c= 6.4  HLD - Previous LDL not within ADA/AHA guideline. - LDL goal<70 - Zetia 10 mg daily  Hx PE - Continue Xarelto 20 mg daily  Hypomagnesmia -Secondary to N/V/D - Magnesium goal> 2 - Magnesium IV 2 g prior to discharge  Noncompliance  - Patient the exact medication which would help with her diagnosis.  In addition also eating and drinking food which will aggravate her symptoms.    Discharge Diagnoses:  Principal Problem:   Acute ischemic enteritis ([place] Active Problems:   Diabetes type 2, controlled  ([place]   Dyslipidemia   HTN (hypertension)   GERD (gastroesophageal reflux disease)   COLONIC POLYPS, HX OF   Scoliosis   Vertigo   Fatigue   Diastolic dysfunction   Diarrhea   Pulmonary embolism (HCC)   Syncope   Hypokalemia   Discharge Condition: Stable  Diet recommendation: Heart healthy/carb modified  Filed Weights   08/08/18 0610 08/09/18 0117 08/10/18 0534  Weight: 65.6 kg 67.4 kg 66 kg    History of present illness:  73y.o.WF PMHx DM Type 2 controlled with complication, HTN, CAD, HLD GERD, vertigo, scoliosis, asthma and chronic anticoagulation with Xarelto   Presented with 1 week of persistent diarrhea with some nausea. Patient reported up to 8-10 stools a day. Associated with persistent nausea and decreased oral intake. She has been trying to keep up with fluids. Denied any fever, denied any sick contact. Denies eating outside the house. No canned foods. Patient has been trying to handle it at home not going out of her house at all. Today however she was going to the bathroom when she felt dizzy weak and passed out. She therefore decided to come to the ER. She was found to be orthostatic here with signs of dehydration so patient is being admitted for work-up. Patient reported seeing streaks of blood in her stool today..Marland Kitchen  Hospital Course:  During his hospitalization patient was worked up for her acute enteritis.  Patient received upper GI scope, and flex sig x2 without finding significant cause of her diarrhea.  Hypothesized that patient's diarrhea because/exacerbated by her noncompliance with medications and diet, given that diarrhea cleared when these 2 factors  were controlled for.  Biopsies were obtained on the second flex sig for completeness patient will follow-up with GI to discuss results.     Procedures: 6/20 flexible sigmoidoscopy: Diverticulosis left colon, internal hemorrhoids 6/20 upper GI:-Patchy mild inflammation characterized by erythema and  granularity was found in the gastric body.Multiple 3 to 5 mm sessile polyps with no bleeding and no stigmata of recent bleeding were found in the gastric body, appearance typcial of benign fundic gland polyps. -A small hiatal hernia  -single 4 mm sessile polyp with no bleeding was found in the second portion of the duodenum. - Normal esophagus 6/25 flex sig  The entire examined colon is normal. Biopsied. - Return patient to hospital ward for ongoing care. - Resume regular diet. - Await pathology results. - Continue with current treatment. - If she does not have any further diarrhea or significant diarrhea tonight, she can be discharged home from the GI standpoint. - Follow up with Dr. Collene Mares in 2 weeks upon discharge.  Consultations: GI Dr. Carol Ada  Cultures   6/25: Biopsy pending    Discharge Exam: Vitals:   08/09/18 2003 08/10/18 0531 08/10/18 0532 08/10/18 0534  BP: (!) 141/76 138/71 (!) 144/69 (!) 144/67  Pulse: 61 64 65 66  Resp: 16 16    Temp: 98.4 F (36.9 C) 98.7 F (37.1 C)    TempSrc: Oral Oral    SpO2: 97% 98%    Weight:    66 kg  Height:        General: A/O x4, no acute respiratory distress Eyes: negative scleral hemorrhage, negative anisocoria, negative icterus ENT: Negative Runny nose, negative gingival bleeding, Neck:  Negative scars, masses, torticollis, lymphadenopathy, JVD Lungs: Clear to auscultation bilaterally without wheezes or crackles Cardiovascular: Regular rate and rhythm without murmur gallop or rub normal S1 and S2   Discharge Instructions   Allergies as of 08/10/2018      Reactions   Phenergan [promethazine Hcl] Shortness Of Breath, Anxiety   Amlodipine Swelling   Clarithromycin Nausea Only   I can pass out    Codeine Nausea Only   I can pass out    Erythromycin    Other reaction(s): Vomiting (intolerance)   Statins    Muscle pain    Tetracycline       Medication List    STOP taking these medications   clindamycin  1 % external solution Commonly known as: CLEOCIN T   diazepam 2 MG tablet Commonly known as: VALIUM   Enbrel SureClick 50 MG/ML injection Generic drug: etanercept   Pitavastatin Calcium 1 MG Tabs Replaced by: pravastatin 20 MG tablet     TAKE these medications   blood glucose meter kit and supplies Kit Dispense based on patient and insurance preference. ONE TOUCH ULTRA   cholestyramine 4 g packet Commonly known as: QUESTRAN Take 1 packet (4 g total) by mouth 3 (three) times daily.   diphenoxylate-atropine 2.5-0.025 MG tablet Commonly known as: LOMOTIL Take 2 tablets by mouth 3 (three) times daily. What changed:   when to take this  reasons to take this   ezetimibe 10 MG tablet Commonly known as: ZETIA Take 1 tablet (10 mg total) by mouth daily.   famotidine 40 MG tablet Commonly known as: PEPCID Take 1 tablet (40 mg total) by mouth at bedtime.   glucose blood test strip Commonly known as: ONE TOUCH ULTRA TEST Use test strip to check blood sugar at least 3 times   nebivolol 5 MG tablet Commonly  known as: BYSTOLIC Take 1 tablet (5 mg total) by mouth daily. What changed:   medication strength  how much to take   olmesartan 20 MG tablet Commonly known as: BENICAR Take 1 tablet (20 mg total) by mouth 2 (two) times daily.   omeprazole 20 MG capsule Commonly known as: PRILOSEC Take 20 mg by mouth daily.   ondansetron 4 MG tablet Commonly known as: ZOFRAN Take 4 mg by mouth every 8 (eight) hours as needed for nausea or vomiting.   onetouch ultrasoft lancets Use lancet to test blood sugar at least twice a day or as directed by your physician.   pravastatin 20 MG tablet Commonly known as: PRAVACHOL Take 1 tablet (20 mg total) by mouth daily at 6 PM. Replaces: Pitavastatin Calcium 1 MG Tabs   rivaroxaban 20 MG Tabs tablet Commonly known as: Xarelto Take 1 tablet (20 mg total) by mouth daily with supper. OK to restart this medication on 03/15/2018.    scopolamine 1 MG/3DAYS Commonly known as: TRANSDERM-SCOP Place 1 patch (1.5 mg total) onto the skin every 3 (three) days. Start taking on: August 12, 2018      Allergies  Allergen Reactions  . Phenergan [Promethazine Hcl] Shortness Of Breath and Anxiety  . Amlodipine Swelling  . Clarithromycin Nausea Only    I can pass out   . Codeine Nausea Only    I can pass out   . Erythromycin     Other reaction(s): Vomiting (intolerance)  . Statins     Muscle pain   . Tetracycline    Follow-up Information    Care, [facility] Follow up.   Specialty: [place] Why: someone will call to schedule a time for physical therapy Contact information: [address] [place] [address] [phone]        [place] Follow up.   Why: provided walker and bedside commode           The results of significant diagnostics from this hospitalization (including imaging, microbiology, ancillary and laboratory) are listed below for reference.    Significant Diagnostic Studies: Ct Abdomen Pelvis Wo Contrast  Result Date: 08/01/2018 CLINICAL DATA:  Nausea for a week, diarrhea EXAM: CT ABDOMEN AND PELVIS WITHOUT CONTRAST TECHNIQUE: Multidetector CT imaging of the abdomen and pelvis was performed following the standard protocol without IV contrast. COMPARISON:  07/27/2018 FINDINGS: Lower chest: Stable low-attenuation 2 cm nodular density in the lingula likely reflecting a mucocele. Right middle lobe nodularity again noted. 13 mm known right lower lobe nodule. Hepatobiliary: No focal liver abnormality is seen. Hepatic steatosis. No gallstones, gallbladder wall thickening, or biliary dilatation. High density material within the gallbladder likely reflecting contrast from recent IV contrast administration. Pancreas: Unremarkable. No pancreatic ductal dilatation or surrounding inflammatory changes. Spleen: Normal in size without focal abnormality. Adrenals/Urinary Tract:  Normal adrenal glands. 2.7 cm hypodense, fluid attenuating right renal mass consistent with a cyst. No urolithiasis or obstructive uropathy. Normal decompressed bladder. Stomach/Bowel: Small hiatal hernia. Stomach is within normal limits. No evidence of bowel wall thickening, distention, or inflammatory changes. Multiple fluid-filled loops of small as can be seen with mild enteritis. Vascular/Lymphatic: Normal caliber abdominal aorta with mild atherosclerosis. No lymphadenopathy. Reproductive: Uterus and bilateral adnexa are unremarkable. Other: No abdominal wall hernia or abnormality. No abdominopelvic ascites. Musculoskeletal: Thoracolumbar spine spondylosis. No acute osseous abnormality. No aggressive osseous lesion. Levoscoliosis of the lumbar spine. IMPRESSION: 1. Multiple fluid-filled loops of small as can be seen with mild enteritis.  2.  Aortic Atherosclerosis (ICD10-I70.0). 3. 13 mm known right lower lobe nodule. As per the prior PET, chest CT follow-up is recommended. 4. Hepatic steatosis. 5. Right middle lobe and lingular low density nodules likely reflecting mucoceles. Electronically Signed   By: Kathreen Devoid   On: 08/01/2018 18:26   Ct Abdomen Pelvis W Contrast  Result Date: 07/27/2018 CLINICAL DATA:  Diarrhea.  Nausea vomiting for a week. EXAM: CT ABDOMEN AND PELVIS WITH CONTRAST TECHNIQUE: Multidetector CT imaging of the abdomen and pelvis was performed using the standard protocol following bolus administration of intravenous contrast. CONTRAST:  122m OMNIPAQUE IOHEXOL 300 MG/ML  SOLN COMPARISON:  12/22/2017 chest CT. 01/01/2018 PET. Abdominopelvic CT of 04/12/2007 FINDINGS: Lower chest: Left lower lobe somewhat irregular density is similar in size to 01/01/2018 PET, favored to represent a mucocele. There are also areas of right middle lobe nodularity which are unchanged and favored to represent mucoceles. New or increased right lower lobe 1.2 cm nodular density including on image 10/4. Normal  heart size without pericardial or pleural effusion. Tiny hiatal hernia. Hepatobiliary: Mild hepatic steatosis, without focal liver lesion. Normal gallbladder, without biliary ductal dilatation. Pancreas: Pancreatic fatty replacement throughout, without duct dilatation or dominant mass. Spleen: Normal in size, without focal abnormality. Adrenals/Urinary Tract: Normal adrenal glands. Normal left kidney. Inter/lower pole right renal lesion measures 2.9 cm. Has mild interstitial thickening surrounding and heterogeneity within, including image 34/3. At the site of a simple 7.2 cm cyst back in 2009. Normal urinary bladder. Stomach/Bowel: The proximal stomach is underdistended but appears thick walled, including at 3.1 cm on 21/3. Fluid-filled colon. Scattered colonic diverticula. Normal terminal ileum. Normal small bowel. Vascular/Lymphatic: Aortic and branch vessel atherosclerosis. No abdominopelvic adenopathy. Reproductive: Central uterine hypoattenuation, including at 1.3 cm on sagittal image 92. No adnexal mass. Other: Moderate pelvic floor laxity.  No significant free fluid. Musculoskeletal: Convex left lumbar spine curvature. Lumbosacral spondylosis. IMPRESSION: 1.  No acute process in the abdomen or pelvis. 2. Fluid-filled colon, consistent with a diarrheal state. 3. Primarily similar bilateral pulmonary nodular densities, favored to represent mucoceles. A right lower lobe density is new or increased. As per the prior PET, chest CT follow-up is recommended. 4. Hepatic steatosis. 5. Gastric underdistention. Cannot exclude concurrent gastritis. Appearance of wall thickening has been present over multiple prior exams. 6.  Aortic Atherosclerosis (ICD10-I70.0). 7. Right renal lesion which demonstrates complexity. Favored to represent an involuting cyst, possibly complicated by prior hemorrhage or infection. Recommend attention on follow-up. 8. Central uterine hypoattenuation, abnormal for age. Correlate with  postmenopausal bleeding and consider nonemergent pelvic ultrasound. Electronically Signed   By: KAbigail MiyamotoM.D.   On: 07/27/2018 12:54    Microbiology: Recent Results (from the past 240 hour(s))  Stool C-Diff Toxin Assay     Status: None   Collection Time: 07/31/18  4:55 PM   Specimen: Stool   STOOL  Result Value Ref Range Status   C difficile Toxins A+B, EIA Negative Negative Final  Stool Giardia/Cryptosporidium     Status: None   Collection Time: 07/31/18  4:55 PM   Specimen: Stool  Result Value Ref Range Status   MICRO NUMBER: [id number] Final   SPECIMEN QUALITY: Adequate  Final   Source: STOOL  Final   STATUS: FINAL  Final   RESULT: Not Detected  Final   COMMENT:   Final    NOTE: Due to intermittent shedding, one negative sample does not necessarily rule out the presence of a parasitic infection.  MICRO NUMBER: [id number]  Final   SPECIMEN QUALITY: Adequate  Final   Source: STOOL  Final   STATUS: FINAL  Final   RESULT: Not Detected  Final   COMMENT:   Final    NOTE: Due to intermittent shedding, one negative sample does not necessarily rule out the presence of a parasitic infection.  Stool Culture     Status: None   Collection Time: 07/31/18  4:55 PM   Specimen: Stool  Result Value Ref Range Status   MICRO NUMBER: [id number]  Final   SPECIMEN QUALITY: Adequate  Final   SOURCE: STOOL  Final   STATUS: FINAL  Final   SHIGA RESULT: Not Detected  Final   MICRO NUMBER: [id number]  Final   SPECIMEN QUALITY: Adequate  Final   Source STOOL  Final   STATUS: FINAL  Final   CAM RESULT: No enteric Campylobacter isolated  Final   MICRO NUMBER: [id number]  Final   SPECIMEN QUALITY: Adequate  Final   SOURCE: STOOL  Final   STATUS: FINAL  Final   SS RESULT: No Salmonella or Shigella isolated  Final  Gastrointestinal Panel by PCR , Stool     Status: None   Collection Time: 08/01/18  6:50 PM   Specimen: Stool  Result Value Ref Range Status   Campylobacter species NOT DETECTED NOT  DETECTED Final   Plesimonas shigelloides NOT DETECTED NOT DETECTED Final   Salmonella species NOT DETECTED NOT DETECTED Final   Yersinia enterocolitica NOT DETECTED NOT DETECTED Final   Vibrio species NOT DETECTED NOT DETECTED Final   Vibrio cholerae NOT DETECTED NOT DETECTED Final   Enteroaggregative E coli (EAEC) NOT DETECTED NOT DETECTED Final   Enteropathogenic E coli (EPEC) NOT DETECTED NOT DETECTED Final   Enterotoxigenic E coli (ETEC) NOT DETECTED NOT DETECTED Final   Shiga like toxin producing E coli (STEC) NOT DETECTED NOT DETECTED Final   Shigella/Enteroinvasive E coli (EIEC) NOT DETECTED NOT DETECTED Final   Cryptosporidium NOT DETECTED NOT DETECTED Final   Cyclospora cayetanensis NOT DETECTED NOT DETECTED Final   Entamoeba histolytica NOT DETECTED NOT DETECTED Final   Giardia lamblia NOT DETECTED NOT DETECTED Final   Adenovirus F40/41 NOT DETECTED NOT DETECTED Final   Astrovirus NOT DETECTED NOT DETECTED Final   Norovirus GI/GII NOT DETECTED NOT DETECTED Final   Rotavirus A NOT DETECTED NOT DETECTED Final   Sapovirus (I, II, IV, and V) NOT DETECTED NOT DETECTED Final    Comment: Performed at [facility], [address] [place], [place] [address]  C Difficile Quick Screen w PCR reflex     Status: None   Collection Time: 08/01/18  6:50 PM   Specimen: Stool  Result Value Ref Range Status   C Diff antigen NEGATIVE NEGATIVE Final   C Diff toxin NEGATIVE NEGATIVE Final   C Diff interpretation No C. difficile detected.  Final    Comment: Performed at [facility], [place] [address] [place], [place] [address]  Novel Coronavirus,NAA,(SEND-OUT TO REF LAB - TAT 24-48 hrs); Hosp Order     Status: None   Collection Time: 08/01/18  7:35 PM   Specimen: Nasopharyngeal Swab; Respiratory  Result Value Ref Range Status   SARS-CoV-2, NAA NOT DETECTED NOT DETECTED Final    Comment: (NOTE) This test was developed and its performance characteristics determined by [employer]. This test has not been FDA cleared or approved. This test has been authorized by FDA under an Emergency Use Authorization (EUA). This  test is only authorized for the duration of time the declaration that circumstances exist justifying the authorization of the emergency use of in vitro diagnostic tests for detection of SARS-CoV-2 virus and/or diagnosis of COVID-19 infection under section 564(b)(1) of the Act, 21 U.S.C. 419QQI-2(L)(7), unless the authorization is terminated or revoked sooner. When diagnostic testing is negative, the possibility of a false negative result should be considered in the context of a patient's recent exposures and the presence of clinical signs and symptoms consistent with COVID-19. An individual without symptoms of COVID-19 and who is not shedding SARS-CoV-2 virus would expect to have a negative (not detected) result in this assay. Performed  At: [facility] [address] [place], [place] [id number] Rush Farmer MD DE:[id number]    [place]  Final    Comment: Performed at [facility], [place] [address] [place], [place] [address]     Labs: Basic Metabolic Panel: Recent Labs  Lab 08/05/18 [phone] 08/06/18 0615 08/07/18 0537 08/08/18 0626 08/09/18 1025  NA 140 138 139 141 140  K 4.9 3.8 3.8 3.8 4.2  CL 120* 117* 115* 116* 113*  CO2 10* 14* 17* 20* 21*  GLUCOSE 73 108* 82 88 83  BUN 6* <5* <5* <5* <5*  CREATININE 1.09* 1.03* 1.01* 0.96 0.87  CALCIUM 8.1* 8.2* 8.3* 8.7* 8.5*  MG 2.0  --   --   --  1.4*   Liver Function Tests: Recent Labs  Lab 08/09/18 1025  AST 16  ALT 19  ALKPHOS 48  BILITOT 0.8  PROT 5.5*  ALBUMIN 2.8*   No results for input(s): LIPASE, AMYLASE in the last 168 hours. No results for input(s): AMMONIA in the last 168 hours. CBC: Recent Labs  Lab 08/05/18 0711 08/09/18 1025  WBC 8.2 8.0  NEUTROABS 5.4  --   HGB 10.5* 10.2*  HCT 33.2* 31.4*  MCV 101.2* 97.2  PLT  224 229   Cardiac Enzymes: No results for input(s): CKTOTAL, CKMB, CKMBINDEX, TROPONINI in the last 168 hours. BNP: BNP (last 3 results) Recent Labs    12/23/17 2339  BNP 40.2    ProBNP (last 3 results) No results for input(s): PROBNP in the last 8760 hours.  CBG: Recent Labs  Lab 08/09/18 1104 08/09/18 1624 08/09/18 1738 08/09/18 2102 08/10/18 0540  GLUCAP 70 58* 74 128* 87       Signed:  Dia Crawford, MD [facility] [phone] pager

## 2018-08-10 NOTE — Progress Notes (Signed)
Physical Therapy Treatment Patient Details Name: [name] MRN: [id number] DOB: [birth year] Today's Date: 08/10/2018    History of Present Illness 73 y.o. female with medical history significant of diabetes, hypertension, coronary artery disease, GERD, vertigo, scoliosis, hyperlipidemia, asthma and chronic anticoagulation with Xarelto. Pt admitted with 1 week of persistent diarrhea with some nausea with syncope and acute enteritis    PT Comments    Pt with progressive activity tolerance with use of rW with increased movement and stability in room without RW as well. Pt even able to high knee during gait a few times and reports continued decreased speed from baseline but progressing. D/C plan appropriate.     Follow Up Recommendations  Home health PT     Equipment Recommendations  Rolling walker with 5" wheels    Recommendations for Other Services       Precautions / Restrictions Precautions Precautions: Fall Restrictions Weight Bearing Restrictions: No    Mobility  Bed Mobility Overal bed mobility: Modified Independent Bed Mobility: Supine to Sit;Sit to Supine     Supine to sit: Modified independent (Device/Increase time) Sit to supine: Modified independent (Device/Increase time)   General bed mobility comments: HOB flat without rail  Transfers Overall transfer level: Modified independent               General transfer comment: pt stood from bed, BSC without assist  Ambulation/Gait Ambulation/Gait assistance: Supervision Gait Distance (Feet): 500 Feet Assistive device: Rolling walker (2 wheeled) Gait Pattern/deviations: Step-through pattern;Decreased stride length   Gait velocity interpretation: >2.62 ft/sec, indicative of community ambulatory General Gait Details: cues for position in RW with increased gait tolerance with maintained reliance on RW and would not attempt without AD   Stairs             Wheelchair Mobility    Modified  Rankin (Stroke Patients Only)       Balance Overall balance assessment: Needs assistance   Sitting balance-Leahy Scale: Good       Standing balance-Leahy Scale: Good Standing balance comment: pt standing at sink to wash face and brush teeth without UE assist                            Cognition Arousal/Alertness: Awake/alert Behavior During Therapy: WFL for tasks assessed/performed Overall Cognitive Status: Within Functional Limits for tasks assessed                                        Exercises General Exercises - Lower Extremity Long Arc Quad: AROM;20 reps;Both;Seated Hip ABduction/ADduction: AROM;20 reps;Both;Supine Hip Flexion/Marching: AROM;20 reps;Both;Seated    General Comments        Pertinent Vitals/Pain Pain Assessment: No/denies pain    Home Living                      Prior Function            PT Goals (current goals can now be found in the care plan section) Progress towards PT goals: Progressing toward goals    Frequency           PT Plan Current plan remains appropriate    Co-evaluation              AM-PAC PT "6 Clicks" Mobility   Outcome Measure  Help needed turning from your back to your  side while in a flat bed without using bedrails?: None Help needed moving from lying on your back to sitting on the side of a flat bed without using bedrails?: None Help needed moving to and from a bed to a chair (including a wheelchair)?: None Help needed standing up from a chair using your arms (e.g., wheelchair or bedside chair)?: None Help needed to walk in hospital room?: A Little Help needed climbing 3-5 steps with a railing? : A Little 6 Click Score: 22    End of Session Equipment Utilized During Treatment: Gait belt Activity Tolerance: Patient tolerated treatment well Patient left: in bed;with call bell/phone within reach;with nursing/sitter in room Nurse Communication: Mobility status PT  Visit Diagnosis: Other abnormalities of gait and mobility (R26.89);Muscle weakness (generalized) (M62.81)     Time: 0930-1000 PT Time Calculation (min) (ACUTE ONLY): 30 min  Charges:  $Gait Training: 8-22 mins $Therapeutic Exercise: 8-22 mins                     Denali Becvar Pam Drown, PT Acute Rehabilitation Services Pager: [phone] Office: [place] 08/10/2018, 12:16 PM

## 2018-08-10 NOTE — Plan of Care (Signed)
  Problem: Education: Goal: Knowledge of General Education information will improve Description: Including pain rating scale, medication(s)/side effects and non-pharmacologic comfort measures Outcome: Adequate for Discharge   Problem: Health Behavior/Discharge Planning: Goal: Ability to manage health-related needs will improve Outcome: Adequate for Discharge   Problem: Clinical Measurements: Goal: Ability to maintain clinical measurements within normal limits will improve Outcome: Adequate for Discharge Goal: Will remain free from infection Outcome: Adequate for Discharge Goal: Diagnostic test results will improve Outcome: Adequate for Discharge Goal: Cardiovascular complication will be avoided Outcome: Adequate for Discharge   Problem: Clinical Measurements: Goal: Ability to maintain clinical measurements within normal limits will improve Outcome: Adequate for Discharge   Problem: Clinical Measurements: Goal: Will remain free from infection Outcome: Adequate for Discharge   Problem: Clinical Measurements: Goal: Diagnostic test results will improve Outcome: Adequate for Discharge   Problem: Clinical Measurements: Goal: Cardiovascular complication will be avoided Outcome: Adequate for Discharge   Problem: Clinical Measurements: Goal: Cardiovascular complication will be avoided Outcome: Adequate for Discharge   Problem: Nutrition: Goal: Adequate nutrition will be maintained Outcome: Adequate for Discharge

## 2018-08-10 NOTE — Plan of Care (Signed)
  Problem: Activity: Goal: Risk for activity intolerance will decrease Outcome: Completed/Met   Problem: Coping: Goal: Level of anxiety will decrease Outcome: Completed/Met   Problem: Elimination: Goal: Will not experience complications related to bowel motility Outcome: Completed/Met Goal: Will not experience complications related to urinary retention Outcome: Completed/Met   Problem: Pain Managment: Goal: General experience of comfort will improve Outcome: Completed/Met   Problem: Safety: Goal: Ability to remain free from injury will improve Outcome: Completed/Met   Problem: Skin Integrity: Goal: Risk for impaired skin integrity will decrease Outcome: Completed/Met

## 2018-08-10 NOTE — Progress Notes (Signed)
Subjective: No bowel movements since 4 AM yesterday.  Objective: Vital signs in last 24 hours: Temp:  [98 F (36.7 C)-98.7 F (37.1 C)] 98.7 F (37.1 C) (06/26 0531) Pulse Rate:  [56-68] 66 (06/26 0534) Resp:  [11-18] 16 (06/26 0531) BP: (138-185)/(67-80) 144/67 (06/26 0534) SpO2:  [97 %-99 %] 98 % (06/26 0531) Weight:  [66 kg] 66 kg (06/26 0534) Last BM Date: 08/09/18  Intake/Output from previous day: 06/25 0701 - 06/26 0700 In: 1650.7 [P.O.:680; I.V.:942.7; IV Piggyback:28] Out: 1850 [Urine:1850] Intake/Output this shift: No intake/output data recorded.  General appearance: alert and no distress GI: soft, non-tender; bowel sounds normal; no masses,  no organomegaly  Lab Results: Recent Labs    08/09/18 1025  WBC 8.0  HGB 10.2*  HCT 31.4*  PLT 229   BMET Recent Labs    08/08/18 0626 08/09/18 1025  NA 141 140  K 3.8 4.2  CL 116* 113*  CO2 20* 21*  GLUCOSE 88 83  BUN <5* <5*  CREATININE 0.96 0.87  CALCIUM 8.7* 8.5*   LFT Recent Labs    08/09/18 1025  PROT 5.5*  ALBUMIN 2.8*  AST 16  ALT 19  ALKPHOS 48  BILITOT 0.8   PT/INR No results for input(s): LABPROT, INR in the last 72 hours. Hepatitis Panel No results for input(s): HEPBSAG, HCVAB, HEPAIGM, HEPBIGM in the last 72 hours. C-Diff No results for input(s): CDIFFTOX in the last 72 hours. Fecal Lactopherrin No results for input(s): FECLLACTOFRN in the last 72 hours.  Studies/Results: No results found.  Medications:  Scheduled: . cholecalciferol  2,000 Units Oral QHS  . cholestyramine  4 g Oral 3 times per day  . dicyclomine  10 mg Oral TID AC & HS  . diphenoxylate-atropine  2 tablet Oral TID  . famotidine  40 mg Oral QHS  . feeding supplement  1 Container Oral TID BM  . insulin aspart  0-5 Units Subcutaneous QHS  . insulin aspart  0-9 Units Subcutaneous TID WC  . multivitamin with minerals  1 tablet Oral Daily  . nebivolol  5 mg Oral Daily  . ondansetron  4 mg Oral TID AC  .  pantoprazole  40 mg Oral BID AC  . pravastatin  20 mg Oral q1800  . rifaximin  550 mg Oral BID  . rivaroxaban  20 mg Oral Q supper  . saccharomyces boulardii  500 mg Oral BID  . scopolamine  1 patch Transdermal Q72H   Continuous: . sodium chloride 1,000 mL (08/05/18 1425)  . 0.9 % NaCl with KCl 20 mEq / L 100 mL/hr at 08/10/18 0647  . lactated ringers Stopped (08/04/18 1320)    Assessment/Plan: 1) Diarrhea - resolved.  Unknown etiology. 2) Nausea resolved.   No further GI work up necessary.  She can be discharged home from the GI standpoint. The patient can be maintained on scheduled Lomotil and cholestyramine.  Her magnesium is low and she has been receiving IV replacement.  She can have oral supplementation, but this can result in diarrhea.  LOS: 9 days   [name] D 08/10/2018, 7:24 AM

## 2018-08-10 NOTE — Progress Notes (Signed)
Patient discharge teaching is complete with IV removed and medications delivered FYI patient does not take pravastatin and take zetia for cholesterol

## 2018-08-13 ENCOUNTER — Telehealth: Payer: Self-pay | Admitting: Internal Medicine

## 2018-08-13 NOTE — Telephone Encounter (Signed)
FYI

## 2018-08-13 NOTE — Telephone Encounter (Signed)
[name] called from [facility],  Patient did decline to be seen over the weekend.  Nate will be seeing patient 6/30 for her start of care visit.    Call back if any questions [phone]

## 2018-08-14 ENCOUNTER — Telehealth: Payer: Self-pay | Admitting: Internal Medicine

## 2018-08-14 ENCOUNTER — Encounter: Payer: Self-pay | Admitting: Internal Medicine

## 2018-08-14 NOTE — Telephone Encounter (Signed)
Home Health Verbal Orders - Caller/Agency: Benjamine Mola Number: [phone] Requesting OT/PT/Skilled Nursing/Social Work/Speech Therapy: PT starting next week, following a recent discharge from the hospital. Frequency: 2 week 3

## 2018-08-15 ENCOUNTER — Other Ambulatory Visit: Payer: Self-pay

## 2018-08-15 ENCOUNTER — Ambulatory Visit (INDEPENDENT_AMBULATORY_CARE_PROVIDER_SITE_OTHER)
Admission: RE | Admit: 2018-08-15 | Discharge: 2018-08-15 | Disposition: A | Discharge status: Home / Self Care | Payer: Medicare Other | Source: Ambulatory Visit | Attending: Pulmonary Disease | Admitting: Pulmonary Disease

## 2018-08-15 DIAGNOSIS — R918 Other nonspecific abnormal finding of lung field: Secondary | ICD-10-CM | POA: Diagnosis not present

## 2018-08-15 NOTE — Telephone Encounter (Signed)
LVM giving ok for verbal orders. 

## 2018-08-19 DIAGNOSIS — K529 Noninfective gastroenteritis and colitis, unspecified: Secondary | ICD-10-CM | POA: Insufficient documentation

## 2018-08-19 NOTE — Progress Notes (Signed)
Subjective:    Patient ID: [name], female    DOB: 23-Aug-1945, 73 y.o.   MRN: [id number]  HPI The patient is here for follow up of the hospital.  Admitted 6/17-6/26/20 for diarrhea  She was having diarrhea for one week with associated nausea and decreased oral intake.  She had 8-10  stools a day. She denied fever.  She passed out the day she went to the hospital.  She was orthostatic and dehydrated.  She had streaks of blood in her stool that morning.   She was diagnosed with acute enteritis.  She was FOBT x 2 positive.  She was started on cipro/flagyl w/o improvement.  Her COVID-19 test and GI stool panel was negative.  She had an EGD and flex sigmoidoscopy x 2.  When she was npo and junk food was discontinued ( graham crackers, juice cups) her symptoms improved.  She was started on cholestyramine 4 g TID and Lomotil.   She was advised to follow up with Dr Collene Mares.  Her chronic medical problems were stable.       Procedures: 6/20 flexible sigmoidoscopy:Diverticulosis left colon, internal hemorrhoids 6/20 upper GI:-Patchy mild inflammation characterized by erythema and granularity was found in the gastric body.Multiple 3 to 5 mm sessile polyps with no bleeding and no stigmata of recent bleeding were found in the gastric body, appearance typcial of benign fundic gland polyps. -A small hiatal hernia  -single 4 mm sessile polyp with no bleeding was found in the second portion of the duodenum. - Normal esophagus 6/25 flex sig  The entire examined colon is normal. Biopsied. - Return patient to hospital ward for ongoing care. - Resume regular diet. - Await pathology results. - Continue with current treatment. - If she does not have any further diarrhea or significant diarrhea tonight, she can be discharged home from the GI standpoint. - Follow up with Dr. Collene Mares in 2 weeks upon discharge.   She tried the cholestyramine and it made it worse.  Lotomil did not help.  She did  see Dr Collene Mares and she put her on xifaxan and a probiotic.  The xifaxan made her sicker and she stopped it after 4 days.  She has not contacted her in regards of what to do next.      She is having difficulty keeping any food down and even liquids down, but is eating ice and trying to drink as much as possible.    She has constant diarrhea - numerous times a day.  She denies mucus or blood in the stool.  Her abdomen is tender but that is just starting.  She denies any abdominal cramping or pain.  She denies fever or chills.   She feels horrible and she is incredibly weak.    She again denies any new medication within the past few weeks of developing diarrhea.  She did states she was taking less Enbrel for a while before the diarrhea started.  Currently she is only taking her Prilosec and Xarelto.  She is not taking her other medications.  The only thing that did help a little when she first came home from the hospital was the scopolamine patch.   Medications and allergies reviewed with patient and updated if appropriate.  Patient Active Problem List   Diagnosis Date Noted  . Enteritis 08/19/2018  . Acute ischemic enteritis ([place]) 08/01/2018  . Syncope 08/01/2018  . Hypokalemia 08/01/2018  . Abnormal CT scan, pelvis 07/31/2018  . Pneumothorax 03/14/2018  .  S/P bronchoscopy with biopsy   . Lung abnormality 01/11/2018  . Pulmonary embolism ([place]) 12/24/2017  . Lung mass 12/24/2017  . Small vessel disease, cerebrovascular 07/31/2017  . Atypical chest pain 06/14/2017  . Dizziness 06/14/2017  . Easy bruising 05/30/2017  . Hyperuricemia 05/03/2017  . Arthralgia 05/01/2017  . Hair loss 05/01/2017  . Vitamin D deficiency 10/31/2016  . Redness of skin, feet 10/31/2016  . Diarrhea 06/06/2016  . Diastolic dysfunction 93/81/8299  . Vertigo 12/24/2015  . Fatigue 12/24/2015  . Nonspecific abnormal electrocardiogram (ECG) (EKG) 12/24/2015  . Bilateral carotid artery disease, Mild 05/31/2015  .  Thyroid nodule 05/31/2015  . Psoriasis   . Scoliosis   . Diabetes type 2, controlled ([place]) 06/03/2008  . Dyslipidemia 06/03/2008  . CARPAL TUNNEL SYNDROME 06/03/2008  . HTN (hypertension) 06/03/2008  . ALLERGIC RHINITIS 06/03/2008  . Asthma 06/03/2008  . GERD (gastroesophageal reflux disease) 06/03/2008  . Osteopenia 06/03/2008  . COLONIC POLYPS, HX OF 06/03/2008    Current Outpatient Medications on File Prior to Visit  Medication Sig Dispense Refill  . blood glucose meter kit and supplies KIT Dispense based on patient and insurance preference. ONE TOUCH ULTRA 1 each 0  . cholestyramine (QUESTRAN) 4 g packet Take 1 packet (4 g total) by mouth 3 (three) times daily. 60 each 0  . diphenoxylate-atropine (LOMOTIL) 2.5-0.025 MG tablet Take 2 tablets by mouth 3 (three) times daily. 180 tablet 0  . ezetimibe (ZETIA) 10 MG tablet Take 1 tablet (10 mg total) by mouth daily. 90 tablet 2  . famotidine (PEPCID) 40 MG tablet Take 1 tablet (40 mg total) by mouth at bedtime. 30 tablet 0  . glucose blood (ONE TOUCH ULTRA TEST) test strip Use test strip to check blood sugar at least 3 times 300 each 1  . Lancets (ONETOUCH ULTRASOFT) lancets Use lancet to test blood sugar at least twice a day or as directed by your physician. 200 each 11  . nebivolol (BYSTOLIC) 5 MG tablet Take 1 tablet (5 mg total) by mouth daily. 30 tablet 0  . olmesartan (BENICAR) 20 MG tablet Take 1 tablet (20 mg total) by mouth 2 (two) times daily. 180 tablet 1  . omeprazole (PRILOSEC) 20 MG capsule Take 20 mg by mouth daily.     . ondansetron (ZOFRAN) 4 MG tablet Take 4 mg by mouth every 8 (eight) hours as needed for nausea or vomiting.    . pravastatin (PRAVACHOL) 20 MG tablet Take 1 tablet (20 mg total) by mouth daily at 6 PM. 30 tablet 0  . rivaroxaban (XARELTO) 20 MG TABS tablet Take 1 tablet (20 mg total) by mouth daily with supper. OK to restart this medication on 03/15/2018. 30 tablet 11  . scopolamine (TRANSDERM-SCOP) 1  MG/3DAYS Place 1 patch (1.5 mg total) onto the skin every 3 (three) days. 1 patch 0   No current facility-administered medications on file prior to visit.     Past Medical History:  Diagnosis Date  . ALLERGIC RHINITIS   . ANEMIA-NOS   . Arthritis   . ASTHMA   . Carpal tunnel syndrome   . COLONIC POLYPS, HX OF   . Coronary artery disease    "mild CAD" noted on 12/05/17 in coronary CT scan  . DIABETES MELLITUS, TYPE II    diet controlled  . GERD   . HYPERLIPIDEMIA   . HYPERTENSION   . OSTEOPENIA   . PONV (postoperative nausea and vomiting)   . Psoriasis    severe, began  soriatane 01/2012  . Rectal fissure   . Scoliosis     Past Surgical History:  Procedure Laterality Date  . benign rectal growth  2004   removed by Dr. Zella Richer  . BIOPSY  08/09/2018   Procedure: BIOPSY;  Surgeon: Carol Ada, MD;  Location: [facility];  Service: Endoscopy;;  . BREAST BIOPSY Right   . BREAST EXCISIONAL BIOPSY Left   . BREAST SURGERY  1988   biopsy  . CESAREAN SECTION    . ESOPHAGOGASTRODUODENOSCOPY (EGD) WITH PROPOFOL N/A 08/04/2018   Procedure: ESOPHAGOGASTRODUODENOSCOPY (EGD) WITH PROPOFOL;  Surgeon: Ladene Artist, MD;  Location: [facility];  Service: Gastroenterology;  Laterality: N/A;  . FLEXIBLE SIGMOIDOSCOPY N/A 08/04/2018   Procedure: FLEXIBLE SIGMOIDOSCOPY;  Surgeon: Ladene Artist, MD;  Location: [facility];  Service: Gastroenterology;  Laterality: N/A;  . FLEXIBLE SIGMOIDOSCOPY N/A 08/09/2018   Procedure: FLEXIBLE SIGMOIDOSCOPY;  Surgeon: Carol Ada, MD;  Location: [facility];  Service: Endoscopy;  Laterality: N/A;  . VIDEO BRONCHOSCOPY WITH ENDOBRONCHIAL NAVIGATION N/A 03/14/2018   Procedure: VIDEO BRONCHOSCOPY WITH ENDOBRONCHIAL NAVIGATION;  Surgeon: Collene Gobble, MD;  Location: [facility];  Service: Thoracic;  Laterality: N/A;    Social History   Socioeconomic History  . Marital status: Single    Spouse name: Not on file  . Number of children: 1  . Years of  education: 15  . Highest education level: Bachelor's degree (e.g., BA, AB, BS)  Occupational History  . Occupation: retired Careers adviser  Social Needs  . Financial resource strain: Not on file  . Food insecurity    Worry: Not on file    Inability: Not on file  . Transportation needs    Medical: Not on file    Non-medical: Not on file  Tobacco Use  . Smoking status: Former Smoker    Packs/day: 1.00    Years: 10.00    Pack years: 10.00    Quit date: 02/14/1974    Years since quitting: 44.5  . Smokeless tobacco: Never Used  Substance and Sexual Activity  . Alcohol use: No  . Drug use: No  . Sexual activity: Not on file  Lifestyle  . Physical activity    Days per week: Not on file    Minutes per session: Not on file  . Stress: Not on file  Relationships  . Social Herbalist on phone: Not on file    Gets together: Not on file    Attends religious service: Not on file    Active member of club or organization: Not on file    Attends meetings of clubs or organizations: Not on file    Relationship status: Not on file  Other Topics Concern  . Not on file  Social History Narrative   Lives alone in a one story home.  Has one child and one grandchild.  Retired Careers adviser.  Education: college.     Family History  Problem Relation Age of Onset  . Lung cancer Father   . Arthritis Other        Parents  . Asthma Other        parent, other relative  . Breast cancer Other        other relative  . Hypertension Other        parent, other relative  . Heart disease Other        parent, other relative  . Heart disease Mother   .  Asthma Mother   . Breast cancer Maternal Aunt 68  . Parkinson's disease Maternal Grandmother   . Rheumatic fever Maternal Grandfather     Review of Systems  Constitutional: Positive for fatigue. Negative for chills and fever.       Generalized weakness - difficulty walking   Cardiovascular: Positive for leg swelling.  Gastrointestinal: Positive for abdominal pain (no cramping - becoming tender), diarrhea, nausea and vomiting. Negative for blood in stool.  Musculoskeletal: Positive for gait problem.  Neurological: Positive for light-headedness.       Objective:   Vitals:   08/20/18 1305  BP: (!) 144/82  Pulse: 78  Resp: 16  Temp: 98.3 F (36.8 C)  SpO2: 98%   BP Readings from Last 3 Encounters:  08/20/18 (!) 144/82  08/10/18 (!) 142/76  07/31/18 (!) 150/84   Wt Readings from Last 3 Encounters:  08/20/18 128 lb (58.1 kg)  08/10/18 145 lb 8 oz (66 kg)  07/31/18 142 lb (64.4 kg)   Body mass index is 24.19 kg/m.   Physical Exam    Constitutional: Ill-appearing. No distress.  HENT:  Head: Normocephalic and atraumatic.  Neck: Neck supple. No tracheal deviation present. No thyromegaly present.  No cervical lymphadenopathy Cardiovascular: Normal rate, regular rhythm and normal heart sounds.   No murmur heard. No carotid bruit .  No edema Pulmonary/Chest: Effort normal and breath sounds normal. No respiratory distress. No has no wheezes. No rales. Abdomen: Soft, nontender, nondistended Skin: Skin is warm and dry. Not diaphoretic.       Assessment & Plan:    See Problem List for Assessment and Plan of chronic medical problems.

## 2018-08-20 ENCOUNTER — Other Ambulatory Visit: Payer: Self-pay

## 2018-08-20 ENCOUNTER — Other Ambulatory Visit: Payer: Self-pay | Admitting: *Deleted

## 2018-08-20 ENCOUNTER — Ambulatory Visit (INDEPENDENT_AMBULATORY_CARE_PROVIDER_SITE_OTHER): Payer: Medicare Other | Admitting: Internal Medicine

## 2018-08-20 ENCOUNTER — Encounter (HOSPITAL_COMMUNITY): Payer: Self-pay

## 2018-08-20 ENCOUNTER — Inpatient Hospital Stay (HOSPITAL_COMMUNITY)
Admission: EM | Admit: 2018-08-20 | Discharge: 2018-08-29 | DRG: 640 | Disposition: A | Discharge status: Home Health | Payer: Medicare Other | Attending: Internal Medicine | Admitting: Internal Medicine

## 2018-08-20 ENCOUNTER — Encounter: Payer: Self-pay | Admitting: Internal Medicine

## 2018-08-20 DIAGNOSIS — E1122 Type 2 diabetes mellitus with diabetic chronic kidney disease: Secondary | ICD-10-CM

## 2018-08-20 DIAGNOSIS — I2510 Atherosclerotic heart disease of native coronary artery without angina pectoris: Secondary | ICD-10-CM | POA: Diagnosis not present

## 2018-08-20 DIAGNOSIS — R197 Diarrhea, unspecified: Secondary | ICD-10-CM | POA: Diagnosis not present

## 2018-08-20 DIAGNOSIS — E119 Type 2 diabetes mellitus without complications: Secondary | ICD-10-CM | POA: Diagnosis present

## 2018-08-20 DIAGNOSIS — I10 Essential (primary) hypertension: Secondary | ICD-10-CM | POA: Diagnosis present

## 2018-08-20 DIAGNOSIS — R112 Nausea with vomiting, unspecified: Secondary | ICD-10-CM

## 2018-08-20 DIAGNOSIS — K219 Gastro-esophageal reflux disease without esophagitis: Secondary | ICD-10-CM | POA: Diagnosis present

## 2018-08-20 DIAGNOSIS — K8681 Exocrine pancreatic insufficiency: Secondary | ICD-10-CM | POA: Diagnosis present

## 2018-08-20 DIAGNOSIS — I2699 Other pulmonary embolism without acute cor pulmonale: Secondary | ICD-10-CM | POA: Diagnosis present

## 2018-08-20 DIAGNOSIS — N170 Acute kidney failure with tubular necrosis: Secondary | ICD-10-CM | POA: Diagnosis present

## 2018-08-20 DIAGNOSIS — R918 Other nonspecific abnormal finding of lung field: Secondary | ICD-10-CM | POA: Diagnosis present

## 2018-08-20 DIAGNOSIS — Z87891 Personal history of nicotine dependence: Secondary | ICD-10-CM

## 2018-08-20 DIAGNOSIS — R911 Solitary pulmonary nodule: Secondary | ICD-10-CM

## 2018-08-20 DIAGNOSIS — K449 Diaphragmatic hernia without obstruction or gangrene: Secondary | ICD-10-CM | POA: Diagnosis present

## 2018-08-20 DIAGNOSIS — Z86711 Personal history of pulmonary embolism: Secondary | ICD-10-CM | POA: Diagnosis present

## 2018-08-20 DIAGNOSIS — Z20828 Contact with and (suspected) exposure to other viral communicable diseases: Secondary | ICD-10-CM | POA: Diagnosis present

## 2018-08-20 DIAGNOSIS — Z86010 Personal history of colonic polyps: Secondary | ICD-10-CM

## 2018-08-20 DIAGNOSIS — D509 Iron deficiency anemia, unspecified: Secondary | ICD-10-CM | POA: Diagnosis present

## 2018-08-20 DIAGNOSIS — Z9119 Patient's noncompliance with other medical treatment and regimen: Secondary | ICD-10-CM

## 2018-08-20 DIAGNOSIS — Z7901 Long term (current) use of anticoagulants: Secondary | ICD-10-CM

## 2018-08-20 DIAGNOSIS — R944 Abnormal results of kidney function studies: Secondary | ICD-10-CM | POA: Diagnosis present

## 2018-08-20 DIAGNOSIS — N179 Acute kidney failure, unspecified: Secondary | ICD-10-CM

## 2018-08-20 DIAGNOSIS — E876 Hypokalemia: Secondary | ICD-10-CM

## 2018-08-20 DIAGNOSIS — Z888 Allergy status to other drugs, medicaments and biological substances status: Secondary | ICD-10-CM

## 2018-08-20 DIAGNOSIS — N281 Cyst of kidney, acquired: Secondary | ICD-10-CM | POA: Diagnosis present

## 2018-08-20 DIAGNOSIS — R55 Syncope and collapse: Secondary | ICD-10-CM

## 2018-08-20 DIAGNOSIS — Z1211 Encounter for screening for malignant neoplasm of colon: Secondary | ICD-10-CM

## 2018-08-20 DIAGNOSIS — J45909 Unspecified asthma, uncomplicated: Secondary | ICD-10-CM | POA: Diagnosis present

## 2018-08-20 DIAGNOSIS — K529 Noninfective gastroenteritis and colitis, unspecified: Secondary | ICD-10-CM | POA: Diagnosis not present

## 2018-08-20 DIAGNOSIS — K8689 Other specified diseases of pancreas: Secondary | ICD-10-CM | POA: Diagnosis present

## 2018-08-20 DIAGNOSIS — E785 Hyperlipidemia, unspecified: Secondary | ICD-10-CM

## 2018-08-20 DIAGNOSIS — Z6826 Body mass index (BMI) 26.0-26.9, adult: Secondary | ICD-10-CM

## 2018-08-20 DIAGNOSIS — E860 Dehydration: Secondary | ICD-10-CM | POA: Diagnosis not present

## 2018-08-20 DIAGNOSIS — Z79899 Other long term (current) drug therapy: Secondary | ICD-10-CM

## 2018-08-20 DIAGNOSIS — R590 Localized enlarged lymph nodes: Secondary | ICD-10-CM | POA: Diagnosis present

## 2018-08-20 DIAGNOSIS — E870 Hyperosmolality and hypernatremia: Secondary | ICD-10-CM | POA: Diagnosis not present

## 2018-08-20 DIAGNOSIS — K55019 Acute (reversible) ischemia of small intestine, extent unspecified: Secondary | ICD-10-CM | POA: Diagnosis not present

## 2018-08-20 DIAGNOSIS — E669 Obesity, unspecified: Secondary | ICD-10-CM | POA: Diagnosis present

## 2018-08-20 DIAGNOSIS — K828 Other specified diseases of gallbladder: Secondary | ICD-10-CM | POA: Diagnosis present

## 2018-08-20 DIAGNOSIS — E861 Hypovolemia: Secondary | ICD-10-CM | POA: Diagnosis present

## 2018-08-20 DIAGNOSIS — Z9111 Patient's noncompliance with dietary regimen: Secondary | ICD-10-CM

## 2018-08-20 DIAGNOSIS — R1110 Vomiting, unspecified: Secondary | ICD-10-CM

## 2018-08-20 DIAGNOSIS — K5730 Diverticulosis of large intestine without perforation or abscess without bleeding: Secondary | ICD-10-CM | POA: Diagnosis present

## 2018-08-20 DIAGNOSIS — Z885 Allergy status to narcotic agent status: Secondary | ICD-10-CM

## 2018-08-20 DIAGNOSIS — N390 Urinary tract infection, site not specified: Secondary | ICD-10-CM | POA: Diagnosis present

## 2018-08-20 DIAGNOSIS — M8580 Other specified disorders of bone density and structure, unspecified site: Secondary | ICD-10-CM | POA: Diagnosis present

## 2018-08-20 DIAGNOSIS — E878 Other disorders of electrolyte and fluid balance, not elsewhere classified: Secondary | ICD-10-CM | POA: Diagnosis not present

## 2018-08-20 DIAGNOSIS — R42 Dizziness and giddiness: Secondary | ICD-10-CM | POA: Diagnosis not present

## 2018-08-20 DIAGNOSIS — E872 Acidosis: Secondary | ICD-10-CM | POA: Diagnosis present

## 2018-08-20 DIAGNOSIS — Z881 Allergy status to other antibiotic agents status: Secondary | ICD-10-CM

## 2018-08-20 DIAGNOSIS — I700 Atherosclerosis of aorta: Secondary | ICD-10-CM | POA: Diagnosis present

## 2018-08-20 LAB — COMPREHENSIVE METABOLIC PANEL
ALT: 30 U/L (ref 0–44)
AST: 24 U/L (ref 15–41)
Albumin: 3.7 g/dL (ref 3.5–5.0)
Alkaline Phosphatase: 69 U/L (ref 38–126)
Anion gap: 13 (ref 5–15)
BUN: 53 mg/dL — ABNORMAL HIGH (ref 8–23)
CO2: 11 mmol/L — ABNORMAL LOW (ref 22–32)
Calcium: 9.5 mg/dL (ref 8.9–10.3)
Chloride: 118 mmol/L — ABNORMAL HIGH (ref 98–111)
Creatinine, Ser: 3.89 mg/dL — ABNORMAL HIGH (ref 0.44–1.00)
GFR calc Af Amer: 12 mL/min — ABNORMAL LOW (ref >60)
GFR calc non Af Amer: 11 mL/min — ABNORMAL LOW (ref >60)
Glucose, Bld: 95 mg/dL (ref 70–99)
Potassium: 4.4 mmol/L (ref 3.5–5.1)
Sodium: 142 mmol/L (ref 135–145)
Total Bilirubin: 1.5 mg/dL — ABNORMAL HIGH (ref 0.3–1.2)
Total Protein: 7.2 g/dL (ref 6.5–8.1)

## 2018-08-20 LAB — CBC
HCT: 40.8 % (ref 36.0–46.0)
Hemoglobin: 12.7 g/dL (ref 12.0–15.0)
MCH: 31.4 pg (ref 26.0–34.0)
MCHC: 31.1 g/dL (ref 30.0–36.0)
MCV: 101 fL — ABNORMAL HIGH (ref 80.0–100.0)
Platelets: 286 10*3/uL (ref 150–400)
RBC: 4.04 MIL/uL (ref 3.87–5.11)
RDW: 15.1 % (ref 11.5–15.5)
WBC: 7 10*3/uL (ref 4.0–10.5)
nRBC: 0 % (ref 0.0–0.2)

## 2018-08-20 LAB — LIPASE, BLOOD: Lipase: 108 U/L — ABNORMAL HIGH (ref 11–51)

## 2018-08-20 MED ORDER — SCOPOLAMINE 1 MG/3DAYS TD PT72: 1.0000 | MEDICATED_PATCH | TRANSDERMAL | 0 refills | Status: DC

## 2018-08-20 MED ORDER — SODIUM CHLORIDE 0.9 % IV BOLUS
1000.0000 mL | Freq: Once | INTRAVENOUS | Status: AC
  Administered 2018-08-21: 1000 mL via INTRAVENOUS

## 2018-08-20 NOTE — Assessment & Plan Note (Signed)
Hold his cholesterol medications until her diarrhea has resolved

## 2018-08-20 NOTE — ED Triage Notes (Signed)
Pt arrives POV for eval of ongoing N/V/D. Pt states that she was recently hospitalized for 1 week for same. Denies abd pain, states her PCP advised her to return because her symptoms were not improving.

## 2018-08-20 NOTE — ED Provider Notes (Signed)
[facility] EMERGENCY DEPARTMENT Provider Note   CSN: [id number] Arrival date & time: 08/20/18  1750     History   Chief Complaint Chief Complaint  Patient presents with  . Abdominal Pain    HPI [name] is a 73 y.o. female.     HPI  This is a 73 yo female with a history of diabetes, hypertension, hyperlipidemia who presents with ongoing nausea, vomiting, diarrhea.  Patient was admitted to the hospital in late June.  She had the same symptoms at that time.  She had work-up to include C. difficile testing, stool studies, endoscopy, colonoscopy, and CT scan.  She reports that she has had persistent symptoms at home.  She reports nausea that is constant.  She also reports loose liquidy stools.  Denies any dysuria or hematuria.  She denies any fevers or known sick contacts.  Denies any recent antibiotic use.  She denies any significant abdominal pain but does report some back pain.  She states "I just do not feel good."  She saw her primary physician who recommended that she come for reevaluation.  I reviewed her chart.  Colonoscopy and endoscopy were largely unremarkable.  Stool studies were negative with the exception of white cells.  C. difficile was negative.  Past Medical History:  Diagnosis Date  . ALLERGIC RHINITIS   . ANEMIA-NOS   . Arthritis   . ASTHMA   . Carpal tunnel syndrome   . COLONIC POLYPS, HX OF   . Coronary artery disease    "mild CAD" noted on 12/05/17 in coronary CT scan  . DIABETES MELLITUS, TYPE II    diet controlled  . GERD   . HYPERLIPIDEMIA   . HYPERTENSION   . OSTEOPENIA   . PONV (postoperative nausea and vomiting)   . Psoriasis    severe, began soriatane 01/2012  . Rectal fissure   . Scoliosis     Patient Active Problem List   Diagnosis Date Noted  . Nausea and vomiting 08/20/2018  . Enteritis 08/19/2018  . Acute ischemic enteritis ([place]) 08/01/2018  . Syncope 08/01/2018  . Hypokalemia 08/01/2018  . Abnormal CT  scan, pelvis 07/31/2018  . Pneumothorax 03/14/2018  . S/P bronchoscopy with biopsy   . Lung abnormality 01/11/2018  . Pulmonary embolism ([place]) 12/24/2017  . Lung mass 12/24/2017  . Small vessel disease, cerebrovascular 07/31/2017  . Atypical chest pain 06/14/2017  . Dizziness 06/14/2017  . Easy bruising 05/30/2017  . Hyperuricemia 05/03/2017  . Arthralgia 05/01/2017  . Hair loss 05/01/2017  . Vitamin D deficiency 10/31/2016  . Redness of skin, feet 10/31/2016  . Diarrhea 06/06/2016  . Diastolic dysfunction 97/35/3299  . Vertigo 12/24/2015  . Fatigue 12/24/2015  . Nonspecific abnormal electrocardiogram (ECG) (EKG) 12/24/2015  . Bilateral carotid artery disease, Mild 05/31/2015  . Thyroid nodule 05/31/2015  . Psoriasis   . Scoliosis   . Diabetes type 2, controlled ([place]) 06/03/2008  . Dyslipidemia 06/03/2008  . CARPAL TUNNEL SYNDROME 06/03/2008  . HTN (hypertension) 06/03/2008  . ALLERGIC RHINITIS 06/03/2008  . Asthma 06/03/2008  . GERD (gastroesophageal reflux disease) 06/03/2008  . Osteopenia 06/03/2008  . COLONIC POLYPS, HX OF 06/03/2008    Past Surgical History:  Procedure Laterality Date  . benign rectal growth  2004   removed by Dr. Zella Richer  . BIOPSY  08/09/2018   Procedure: BIOPSY;  Surgeon: Carol Ada, MD;  Location: [facility];  Service: Endoscopy;;  . BREAST BIOPSY Right   . BREAST EXCISIONAL BIOPSY Left   .  BREAST SURGERY  1988   biopsy  . CESAREAN SECTION    . ESOPHAGOGASTRODUODENOSCOPY (EGD) WITH PROPOFOL N/A 08/04/2018   Procedure: ESOPHAGOGASTRODUODENOSCOPY (EGD) WITH PROPOFOL;  Surgeon: Ladene Artist, MD;  Location: [facility];  Service: Gastroenterology;  Laterality: N/A;  . FLEXIBLE SIGMOIDOSCOPY N/A 08/04/2018   Procedure: FLEXIBLE SIGMOIDOSCOPY;  Surgeon: Ladene Artist, MD;  Location: [facility];  Service: Gastroenterology;  Laterality: N/A;  . FLEXIBLE SIGMOIDOSCOPY N/A 08/09/2018   Procedure: FLEXIBLE SIGMOIDOSCOPY;  Surgeon: Carol Ada, MD;  Location: [facility];  Service: Endoscopy;  Laterality: N/A;  . VIDEO BRONCHOSCOPY WITH ENDOBRONCHIAL NAVIGATION N/A 03/14/2018   Procedure: VIDEO BRONCHOSCOPY WITH ENDOBRONCHIAL NAVIGATION;  Surgeon: Collene Gobble, MD;  Location: [facility];  Service: Thoracic;  Laterality: N/A;     OB History   No obstetric history on file.      Home Medications    Prior to Admission medications   Medication Sig Start Date End Date Taking? Authorizing Provider  blood glucose meter kit and supplies KIT Dispense based on patient and insurance preference. ONE TOUCH ULTRA 05/01/17   Burns, Claudina Lick, MD  diphenoxylate-atropine (LOMOTIL) 2.5-0.025 MG tablet Take 2 tablets by mouth 3 (three) times daily. 08/10/18   Allie Bossier, MD  ezetimibe (ZETIA) 10 MG tablet Take 1 tablet (10 mg total) by mouth daily. 06/08/18   Dorothy Spark, MD  famotidine (PEPCID) 40 MG tablet Take 1 tablet (40 mg total) by mouth at bedtime. 08/10/18   Allie Bossier, MD  glucose blood (ONE TOUCH ULTRA TEST) test strip Use test strip to check blood sugar at least 3 times 05/01/17   [name], MD  Lancets [facility] ULTRASOFT) lancets Use lancet to test blood sugar at least twice a day or as directed by your physician. 05/01/17   [name], MD  nebivolol (BYSTOLIC) 5 MG tablet Take 1 tablet (5 mg total) by mouth daily. 08/10/18   Allie Bossier, MD  olmesartan (BENICAR) 20 MG tablet Take 1 tablet (20 mg total) by mouth 2 (two) times daily. 05/01/18   [name], MD  omeprazole (PRILOSEC) 20 MG capsule Take 20 mg by mouth daily.     [provider]  ondansetron (ZOFRAN) 4 MG tablet Take 4 mg by mouth every 8 (eight) hours as needed for nausea or vomiting.    [provider]  pravastatin (PRAVACHOL) 20 MG tablet Take 1 tablet (20 mg total) by mouth daily at 6 PM. 08/10/18   Allie Bossier, MD  rivaroxaban (XARELTO) 20 MG TABS tablet Take 1 tablet (20 mg total) by mouth daily with supper. OK to  restart this medication on 03/15/2018. 03/16/18   Donita Brooks, NP  scopolamine (TRANSDERM-SCOP) 1 MG/3DAYS Place 1 patch (1.5 mg total) onto the skin every 3 (three) days. 08/20/18   [name], MD    Family History Family History  Problem Relation Age of Onset  . Lung cancer Father   . Arthritis Other        Parents  . Asthma Other        parent, other relative  . Breast cancer Other        other relative  . Hypertension Other        parent, other relative  . Heart disease Other        parent, other relative  . Heart disease Mother   . Asthma Mother   . Breast cancer Maternal Aunt 68  .  Parkinson's disease Maternal Grandmother   . Rheumatic fever Maternal Grandfather     Social History Social History   Tobacco Use  . Smoking status: Former Smoker    Packs/day: 1.00    Years: 10.00    Pack years: 10.00    Quit date: 02/14/1974    Years since quitting: 44.5  . Smokeless tobacco: Never Used  Substance Use Topics  . Alcohol use: No  . Drug use: No     Allergies   Phenergan [promethazine hcl], Amlodipine, Clarithromycin, Codeine, Erythromycin, Statins, and Tetracycline   Review of Systems Review of Systems  Constitutional: Negative for fever.  Respiratory: Negative for shortness of breath.   Cardiovascular: Negative for chest pain.  Gastrointestinal: Positive for diarrhea, nausea and vomiting. Negative for abdominal pain and blood in stool.  Genitourinary: Negative for dysuria.  Musculoskeletal: Positive for back pain.  Neurological: Negative for dizziness.  All other systems reviewed and are negative.    Physical Exam Updated Vital Signs BP 122/89 (BP Location: Right Arm)   Pulse 86   Temp 98.6 F (37 C) (Oral)   Resp 19   Ht 1.6 m (_0 )   Wt 63.5 kg   SpO2 100%   BMI 24.80 kg/m   Physical Exam Vitals signs and nursing note reviewed.  Constitutional:      Appearance: She is well-developed. She is obese.  HENT:     Head: Normocephalic and  atraumatic.     Mouth/Throat:     Comments: Mucous membranes dry Eyes:     Pupils: Pupils are equal, round, and reactive to light.  Neck:     Musculoskeletal: Neck supple.  Cardiovascular:     Rate and Rhythm: Normal rate and regular rhythm.     Heart sounds: Normal heart sounds.  Pulmonary:     Effort: Pulmonary effort is normal. No respiratory distress.     Breath sounds: No wheezing.  Abdominal:     General: Bowel sounds are increased.     Palpations: Abdomen is soft.     Tenderness: There is no abdominal tenderness. There is no guarding or rebound.  Skin:    General: Skin is warm and dry.  Neurological:     Mental Status: She is alert and oriented to person, place, and time.  Psychiatric:        Mood and Affect: Mood normal.      ED Treatments / Results  Labs (all labs ordered are listed, but only abnormal results are displayed) Labs Reviewed  LIPASE, BLOOD - Abnormal; Notable for the following components:      Result Value   Lipase 108 (*)    All other components within normal limits  COMPREHENSIVE METABOLIC PANEL - Abnormal; Notable for the following components:   Chloride 118 (*)    CO2 11 (*)    BUN 53 (*)    Creatinine, Ser 3.89 (*)    Total Bilirubin 1.5 (*)    GFR calc non Af Amer 11 (*)    GFR calc Af Amer 12 (*)    All other components within normal limits  CBC - Abnormal; Notable for the following components:   MCV 101.0 (*)    All other components within normal limits  SARS CORONAVIRUS 2 (HOSPITAL ORDER, [facility])  C DIFFICILE QUICK SCREEN W PCR REFLEX  GASTROINTESTINAL PANEL BY PCR, STOOL (REPLACES STOOL CULTURE)  URINALYSIS, ROUTINE W REFLEX MICROSCOPIC    EKG None  Radiology No results found.  Procedures Procedures (including critical care time)  Medications Ordered in ED Medications  sodium chloride 0.9 % bolus 1,000 mL (has no administration in time range)     Initial Impression / Assessment and  Plan / ED Course  I have reviewed the triage vital signs and the nursing notes.  Pertinent labs & imaging results that were available during my care of the patient were reviewed by me and considered in my medical decision making (see chart for details).        Patient presents with persistent nausea, vomiting, diarrhea.  She is dry appearing on exam but otherwise nontoxic.  Her abdominal exam is fairly benign.  Lab work reviewed.  Mildly elevated lipase to 108.  Additionally her creatinine is now 3.89.  This is up from normal at discharge.  This could be reflective of dehydration.  Urine is pending.  Patient was given fluids.  I have recent C. difficile testing as well as GI studies.  She has hyperactive bowel sounds.  Repeat CT scan also obtained.  She will need admission for hydration and further evaluation.  Final Clinical Impressions(s) / ED Diagnoses   Final diagnoses:  AKI (acute kidney injury) ([place])  Dehydration  Vomiting and diarrhea    ED Discharge Orders    None       Savio Albrecht, Barbette Hair, MD 08/21/18 [id number]

## 2018-08-20 NOTE — Assessment & Plan Note (Signed)
Diagnosed with enteritis-this was not not seen on the first CT scan when she went to the emergency room, but was seen on the second CT scan ?  Enteritis the cause of her diarrhea for reaction to her diarrhea No obvious cause for the diarrhea-stool studies negative-does not appear to be infectious Was tried on Cipro/Flagyl and Xifaxan-both made it worse Associated with nausea, vomiting At this point she is likely dehydrated and becoming malnutrition I will prescribe her more scopolamine Advised that she needs to speak with Dr. Collene Mares to see if there is something else that she can do, but may end up back in the hospital for supportive measures until we figure out what the cause of her diarrhea is

## 2018-08-20 NOTE — Patient Instructions (Addendum)
Continue xarelto and prilosec.  Hold the other medications.     Use the scopolamine for your nausea.   Keep up with fluids.  Try to eat bland foods in small amounts.   Call Dr Collene Mares.

## 2018-08-20 NOTE — Assessment & Plan Note (Signed)
Related to diarrhea-?  Enteritis  Thing that helped was the scopolamine so I will prescribe more of this She is trying to stay hydrated-eating ice and drinking as much fluids as possible, but is likely still dehydrated She will follow-up with Dr. Collene Mares to see if she has any other suggestions for treatment, if not she will likely end up back in the emergency room

## 2018-08-20 NOTE — Assessment & Plan Note (Signed)
Has not taken her blood pressure medications-continue to hold until diarrhea has resolved Likely dehydrated and concern for hypotension

## 2018-08-20 NOTE — Progress Notes (Signed)
Ct ordered for 7/21

## 2018-08-21 ENCOUNTER — Ambulatory Visit: Payer: Medicare Other | Admitting: Internal Medicine

## 2018-08-21 ENCOUNTER — Emergency Department (HOSPITAL_COMMUNITY): Payer: Medicare Other

## 2018-08-21 DIAGNOSIS — D509 Iron deficiency anemia, unspecified: Secondary | ICD-10-CM | POA: Diagnosis present

## 2018-08-21 DIAGNOSIS — R1110 Vomiting, unspecified: Secondary | ICD-10-CM | POA: Diagnosis not present

## 2018-08-21 DIAGNOSIS — E872 Acidosis: Secondary | ICD-10-CM | POA: Diagnosis present

## 2018-08-21 DIAGNOSIS — N281 Cyst of kidney, acquired: Secondary | ICD-10-CM | POA: Diagnosis present

## 2018-08-21 DIAGNOSIS — E876 Hypokalemia: Secondary | ICD-10-CM | POA: Diagnosis not present

## 2018-08-21 DIAGNOSIS — I2699 Other pulmonary embolism without acute cor pulmonale: Secondary | ICD-10-CM

## 2018-08-21 DIAGNOSIS — R944 Abnormal results of kidney function studies: Secondary | ICD-10-CM | POA: Diagnosis present

## 2018-08-21 DIAGNOSIS — E785 Hyperlipidemia, unspecified: Secondary | ICD-10-CM | POA: Diagnosis present

## 2018-08-21 DIAGNOSIS — N390 Urinary tract infection, site not specified: Secondary | ICD-10-CM | POA: Diagnosis present

## 2018-08-21 DIAGNOSIS — K828 Other specified diseases of gallbladder: Secondary | ICD-10-CM | POA: Diagnosis present

## 2018-08-21 DIAGNOSIS — K8689 Other specified diseases of pancreas: Secondary | ICD-10-CM | POA: Diagnosis present

## 2018-08-21 DIAGNOSIS — E1121 Type 2 diabetes mellitus with diabetic nephropathy: Secondary | ICD-10-CM | POA: Diagnosis not present

## 2018-08-21 DIAGNOSIS — E119 Type 2 diabetes mellitus without complications: Secondary | ICD-10-CM | POA: Diagnosis present

## 2018-08-21 DIAGNOSIS — I2510 Atherosclerotic heart disease of native coronary artery without angina pectoris: Secondary | ICD-10-CM | POA: Diagnosis present

## 2018-08-21 DIAGNOSIS — K219 Gastro-esophageal reflux disease without esophagitis: Secondary | ICD-10-CM | POA: Diagnosis present

## 2018-08-21 DIAGNOSIS — I700 Atherosclerosis of aorta: Secondary | ICD-10-CM | POA: Diagnosis present

## 2018-08-21 DIAGNOSIS — K5730 Diverticulosis of large intestine without perforation or abscess without bleeding: Secondary | ICD-10-CM | POA: Diagnosis present

## 2018-08-21 DIAGNOSIS — N179 Acute kidney failure, unspecified: Secondary | ICD-10-CM | POA: Diagnosis not present

## 2018-08-21 DIAGNOSIS — I10 Essential (primary) hypertension: Secondary | ICD-10-CM | POA: Diagnosis present

## 2018-08-21 DIAGNOSIS — R197 Diarrhea, unspecified: Secondary | ICD-10-CM | POA: Diagnosis present

## 2018-08-21 DIAGNOSIS — K449 Diaphragmatic hernia without obstruction or gangrene: Secondary | ICD-10-CM | POA: Diagnosis present

## 2018-08-21 DIAGNOSIS — Z20828 Contact with and (suspected) exposure to other viral communicable diseases: Secondary | ICD-10-CM | POA: Diagnosis present

## 2018-08-21 DIAGNOSIS — N170 Acute kidney failure with tubular necrosis: Secondary | ICD-10-CM | POA: Diagnosis present

## 2018-08-21 DIAGNOSIS — K8681 Exocrine pancreatic insufficiency: Secondary | ICD-10-CM | POA: Diagnosis present

## 2018-08-21 DIAGNOSIS — E870 Hyperosmolality and hypernatremia: Secondary | ICD-10-CM | POA: Diagnosis not present

## 2018-08-21 DIAGNOSIS — E860 Dehydration: Secondary | ICD-10-CM | POA: Diagnosis present

## 2018-08-21 DIAGNOSIS — E878 Other disorders of electrolyte and fluid balance, not elsewhere classified: Secondary | ICD-10-CM | POA: Diagnosis not present

## 2018-08-21 DIAGNOSIS — E861 Hypovolemia: Secondary | ICD-10-CM | POA: Diagnosis present

## 2018-08-21 DIAGNOSIS — R112 Nausea with vomiting, unspecified: Secondary | ICD-10-CM | POA: Diagnosis present

## 2018-08-21 LAB — BASIC METABOLIC PANEL
Anion gap: 11 (ref 5–15)
BUN: 52 mg/dL — ABNORMAL HIGH (ref 8–23)
CO2: 13 mmol/L — ABNORMAL LOW (ref 22–32)
Calcium: 8.8 mg/dL — ABNORMAL LOW (ref 8.9–10.3)
Chloride: 123 mmol/L — ABNORMAL HIGH (ref 98–111)
Creatinine, Ser: 3.15 mg/dL — ABNORMAL HIGH (ref 0.44–1.00)
GFR calc Af Amer: 16 mL/min — ABNORMAL LOW (ref >60)
GFR calc non Af Amer: 14 mL/min — ABNORMAL LOW (ref >60)
Glucose, Bld: 91 mg/dL (ref 70–99)
Potassium: 4 mmol/L (ref 3.5–5.1)
Sodium: 147 mmol/L — ABNORMAL HIGH (ref 135–145)

## 2018-08-21 LAB — URINALYSIS, ROUTINE W REFLEX MICROSCOPIC
Bilirubin Urine: NEGATIVE
Glucose, UA: NEGATIVE mg/dL
Ketones, ur: 5 mg/dL — AB
Nitrite: NEGATIVE
Protein, ur: 100 mg/dL — AB
Specific Gravity, Urine: 1.017 (ref 1.005–1.030)
WBC, UA: >50 WBC/hpf — ABNORMAL HIGH (ref 0–5)
pH: 5 (ref 5.0–8.0)

## 2018-08-21 LAB — HEPARIN LEVEL (UNFRACTIONATED): Heparin Unfractionated: <0.1 IU/mL — ABNORMAL LOW (ref 0.30–0.70)

## 2018-08-21 LAB — GLUCOSE, CAPILLARY
Glucose-Capillary: 77 mg/dL (ref 70–99)
Glucose-Capillary: 88 mg/dL (ref 70–99)
Glucose-Capillary: 77 mg/dL (ref 70–99)
Glucose-Capillary: 108 mg/dL — ABNORMAL HIGH (ref 70–99)
Glucose-Capillary: 94 mg/dL (ref 70–99)
Glucose-Capillary: 94 mg/dL (ref 70–99)

## 2018-08-21 LAB — SARS CORONAVIRUS 2 BY RT PCR (HOSPITAL ORDER, PERFORMED IN ~~LOC~~ HOSPITAL LAB): SARS Coronavirus 2: NEGATIVE

## 2018-08-21 LAB — APTT: aPTT: 32 seconds (ref 24–36)

## 2018-08-21 MED ORDER — SODIUM CHLORIDE 0.45 % IV SOLN
INTRAVENOUS | Status: DC
  Administered 2018-08-21 – 2018-08-22 (×2): via INTRAVENOUS

## 2018-08-21 MED ORDER — DIPHENOXYLATE-ATROPINE 2.5-0.025 MG PO TABS
1.0000 | ORAL_TABLET | Freq: Four times a day (QID) | ORAL | Status: DC | PRN
  Administered 2018-08-23 – 2018-08-24 (×5): 1 via ORAL
  Filled 2018-08-21 (×6): qty 1

## 2018-08-21 MED ORDER — ACETAMINOPHEN 650 MG RE SUPP: 650.0000 mg | Freq: Four times a day (QID) | RECTAL | Status: DC | PRN

## 2018-08-21 MED ORDER — ACETAMINOPHEN 325 MG PO TABS
650.0000 mg | ORAL_TABLET | Freq: Four times a day (QID) | ORAL | Status: DC | PRN
  Administered 2018-08-25: 650 mg via ORAL
  Filled 2018-08-21 (×3): qty 2

## 2018-08-21 MED ORDER — INSULIN ASPART 100 UNIT/ML ~~LOC~~ SOLN
0.0000 [IU] | SUBCUTANEOUS | Status: DC
  Administered 2018-08-23: 1 [IU] via SUBCUTANEOUS
  Administered 2018-08-23 – 2018-08-24 (×2): 2 [IU] via SUBCUTANEOUS
  Administered 2018-08-24 – 2018-08-29 (×6): 1 [IU] via SUBCUTANEOUS

## 2018-08-21 MED ORDER — PANCRELIPASE (LIP-PROT-AMYL) 36000-114000 UNITS PO CPEP
36000.0000 [IU] | ORAL_CAPSULE | Freq: Three times a day (TID) | ORAL | Status: DC
  Administered 2018-08-22 – 2018-08-29 (×20): 36000 [IU] via ORAL
  Filled 2018-08-21 (×21): qty 1

## 2018-08-21 MED ORDER — SODIUM CHLORIDE 0.9 % IV SOLN
1.0000 g | Freq: Every day | INTRAVENOUS | Status: DC
  Administered 2018-08-21 – 2018-08-23 (×4): 1 g via INTRAVENOUS
  Filled 2018-08-21 (×2): qty 1
  Filled 2018-08-21: qty 10
  Filled 2018-08-21 (×2): qty 1

## 2018-08-21 MED ORDER — APIXABAN 2.5 MG PO TABS
2.5000 mg | ORAL_TABLET | Freq: Two times a day (BID) | ORAL | Status: DC
  Administered 2018-08-21 (×2): 2.5 mg via ORAL
  Filled 2018-08-21 (×2): qty 1

## 2018-08-21 MED ORDER — ADULT MULTIVITAMIN W/MINERALS CH
1.0000 | ORAL_TABLET | Freq: Every day | ORAL | Status: DC
  Administered 2018-08-21 – 2018-08-29 (×8): 1 via ORAL
  Filled 2018-08-21 (×9): qty 1

## 2018-08-21 MED ORDER — ONDANSETRON HCL 4 MG PO TABS: 4.0000 mg | ORAL_TABLET | Freq: Four times a day (QID) | ORAL | Status: DC | PRN

## 2018-08-21 MED ORDER — APIXABAN 5 MG PO TABS: 5.0000 mg | ORAL_TABLET | Freq: Two times a day (BID) | ORAL | Status: DC

## 2018-08-21 MED ORDER — SODIUM CHLORIDE 0.9 % IV SOLN
INTRAVENOUS | Status: DC
  Administered 2018-08-21: 03:00:00 via INTRAVENOUS

## 2018-08-21 MED ORDER — PANTOPRAZOLE SODIUM 40 MG PO TBEC
40.0000 mg | DELAYED_RELEASE_TABLET | Freq: Every day | ORAL | Status: DC
  Administered 2018-08-21 – 2018-08-28 (×8): 40 mg via ORAL
  Filled 2018-08-21 (×8): qty 1

## 2018-08-21 MED ORDER — HEPARIN (PORCINE) 25000 UT/250ML-% IV SOLN
900.0000 [IU]/h | INTRAVENOUS | Status: DC
  Administered 2018-08-21: 900 [IU]/h via INTRAVENOUS
  Filled 2018-08-21: qty 250

## 2018-08-21 MED ORDER — ONDANSETRON HCL 4 MG/2ML IJ SOLN
4.0000 mg | Freq: Four times a day (QID) | INTRAMUSCULAR | Status: DC | PRN
  Administered 2018-08-21 – 2018-08-25 (×9): 4 mg via INTRAVENOUS
  Filled 2018-08-21 (×8): qty 2

## 2018-08-21 NOTE — ED Notes (Signed)
Patient transported to CT 

## 2018-08-21 NOTE — Progress Notes (Signed)
ANTICOAGULATION CONSULT NOTE - Initial Consult  Pharmacy Consult for Heparin (Xarelto on hold) Indication: History of PE  Allergies  Allergen Reactions  . Phenergan [Promethazine Hcl] Shortness Of Breath and Anxiety  . Amlodipine Swelling  . Clarithromycin Nausea Only    I can pass out   . Codeine Nausea Only    I can pass out   . Erythromycin     Other reaction(s): Vomiting (intolerance)  . Statins     Muscle pain   . Tetracycline Other (See Comments)    unknown    Patient Measurements: Height: 5\' 1"  (154.9 cm) Weight: 140 lb (63.5 kg) IBW/kg (Calculated) : 47.8  Vital Signs: Temp: 98 F (36.7 C) (07/07 0426) Temp Source: Oral (07/07 0214) BP: 123/71 (07/07 0426) Pulse Rate: 74 (07/07 0426)  Labs: Recent Labs    08/20/18 1939 08/21/18 0501  HGB 12.7  --   HCT 40.8  --   PLT 286  --   APTT  --  32  HEPARINUNFRC  --  <0.10*  CREATININE 3.89* 3.15*    Estimated Creatinine Clearance: 13.4 mL/min (A) (by C-G formula based on SCr of 3.15 mg/dL (H)).   Medical History: Past Medical History:  Diagnosis Date  . ALLERGIC RHINITIS   . ANEMIA-NOS   . Arthritis   . ASTHMA   . Carpal tunnel syndrome   . COLONIC POLYPS, HX OF   . Coronary artery disease    "mild CAD" noted on 12/05/17 in coronary CT scan  . DIABETES MELLITUS, TYPE II    diet controlled  . GERD   . HYPERLIPIDEMIA   . HYPERTENSION   . OSTEOPENIA   . PONV (postoperative nausea and vomiting)   . Psoriasis    severe, began soriatane 01/2012  . Rectal fissure   . Scoliosis    Assessment: On Xarelto PTA for hx PE, holding Xarelto and starting heparin during acute renal failure. Baseline CBC is good.   Baseline heparin level is undetectable (?compliance), no need to monitor aPTTs, it has been >24 hours since last dose, will start heparin now.  Goal of Therapy:  Heparin level 0.3-0.7 units/ml Monitor platelets by anticoagulation protocol: Yes   Plan:  Start heparin drip at 900  units/hr Check heparin level in 8 hours Daily CBC/HL Monitor for bleeding  Narda Bonds, PharmD, [place] Pharmacist Phone: [phone]

## 2018-08-21 NOTE — Progress Notes (Signed)
Updated patient's daughter about condition.

## 2018-08-21 NOTE — ED Notes (Signed)
ED TO INPATIENT HANDOFF REPORT  ED Nurse Name and Phone #:  Patty [phone]  S Name/Age/Gender [name] 73 y.o. female Room/Bed: 017C/017C  Code Status   Code Status: Full Code  Home/SNF/Other Home Patient oriented to: self, place, time and situation Is this baseline? Yes   Triage Complete: Triage complete  Chief Complaint N/V/D  Triage Note Pt arrives POV for eval of ongoing N/V/D. Pt states that she was recently hospitalized for 1 week for same. Denies abd pain, states her PCP advised her to return because her symptoms were not improving.    Allergies Allergies  Allergen Reactions  . Phenergan [Promethazine Hcl] Shortness Of Breath and Anxiety  . Amlodipine Swelling  . Clarithromycin Nausea Only    I can pass out   . Codeine Nausea Only    I can pass out   . Erythromycin     Other reaction(s): Vomiting (intolerance)  . Statins     Muscle pain   . Tetracycline Other (See Comments)    unknown    Level of Care/Admitting Diagnosis ED Disposition    ED Disposition Condition Comment   Admit  Hospital Area: [facility] [[id number]]  Level of Care: Med-Surg [16]  Covid Evaluation: Asymptomatic Screening Protocol (No Symptoms)  Diagnosis: Acute kidney failure [facility]) [[id number]]  Admitting Physician: Doreatha Massed  Attending Physician: Etta Quill [phone]  Estimated length of stay: past midnight tomorrow  Certification:: I certify this patient will need inpatient services for at least 2 midnights  PT Class (Do Not Modify): Inpatient [101]  PT Acc Code (Do Not Modify): Private [1]       B Medical/Surgery History Past Medical History:  Diagnosis Date  . ALLERGIC RHINITIS   . ANEMIA-NOS   . Arthritis   . ASTHMA   . Carpal tunnel syndrome   . COLONIC POLYPS, HX OF   . Coronary artery disease    "mild CAD" noted on 12/05/17 in coronary CT scan  . DIABETES MELLITUS, TYPE II    diet controlled  . GERD   . HYPERLIPIDEMIA   .  HYPERTENSION   . OSTEOPENIA   . PONV (postoperative nausea and vomiting)   . Psoriasis    severe, began soriatane 01/2012  . Rectal fissure   . Scoliosis    Past Surgical History:  Procedure Laterality Date  . benign rectal growth  2004   removed by Dr. Zella Richer  . BIOPSY  08/09/2018   Procedure: BIOPSY;  Surgeon: Carol Ada, MD;  Location: [place];  Service: Endoscopy;;  . BREAST BIOPSY Right   . BREAST EXCISIONAL BIOPSY Left   . BREAST SURGERY  1988   biopsy  . CESAREAN SECTION    . ESOPHAGOGASTRODUODENOSCOPY (EGD) WITH PROPOFOL N/A 08/04/2018   Procedure: ESOPHAGOGASTRODUODENOSCOPY (EGD) WITH PROPOFOL;  Surgeon: Ladene Artist, MD;  Location: [place];  Service: Gastroenterology;  Laterality: N/A;  . FLEXIBLE SIGMOIDOSCOPY N/A 08/04/2018   Procedure: FLEXIBLE SIGMOIDOSCOPY;  Surgeon: Ladene Artist, MD;  Location: [place];  Service: Gastroenterology;  Laterality: N/A;  . FLEXIBLE SIGMOIDOSCOPY N/A 08/09/2018   Procedure: FLEXIBLE SIGMOIDOSCOPY;  Surgeon: Carol Ada, MD;  Location: [place];  Service: Endoscopy;  Laterality: N/A;  . VIDEO BRONCHOSCOPY WITH ENDOBRONCHIAL NAVIGATION N/A 03/14/2018   Procedure: VIDEO BRONCHOSCOPY WITH ENDOBRONCHIAL NAVIGATION;  Surgeon: Collene Gobble, MD;  Location: [facility];  Service: Thoracic;  Laterality: N/A;     A IV Location/Drains/Wounds Patient Lines/Drains/Airways Status   Active Line/Drains/Airways  Name:   Placement date:   Placement time:   Site:   Days:   Peripheral IV 08/21/18 Left;Medial Forearm   08/21/18    0045    Forearm   less than 1          Intake/Output Last 24 hours No intake or output data in the 24 hours ending 08/21/18 0130  Labs/Imaging Results for orders placed or performed during the hospital encounter of 08/20/18 (from the past 48 hour(s))  Lipase, blood     Status: Abnormal   Collection Time: 08/20/18  7:39 PM  Result Value Ref Range   Lipase 108 (H) 11 - 51 U/L    Comment:  Performed at [facility] [address] [place], [place] [address]  Comprehensive metabolic panel     Status: Abnormal   Collection Time: 08/20/18  7:39 PM  Result Value Ref Range   Sodium 142 135 - 145 mmol/L   Potassium 4.4 3.5 - 5.1 mmol/L   Chloride 118 (H) 98 - 111 mmol/L   CO2 11 (L) 22 - 32 mmol/L   Glucose, Bld 95 70 - 99 mg/dL   BUN 53 (H) 8 - 23 mg/dL   Creatinine, Ser 3.89 (H) 0.44 - 1.00 mg/dL   Calcium 9.5 8.9 - 10.3 mg/dL   Total Protein 7.2 6.5 - 8.1 g/dL   Albumin 3.7 3.5 - 5.0 g/dL   AST 24 15 - 41 U/L   ALT 30 0 - 44 U/L   Alkaline Phosphatase 69 38 - 126 U/L   Total Bilirubin 1.5 (H) 0.3 - 1.2 mg/dL   GFR calc non Af Amer 11 (L) >60 mL/min   GFR calc Af Amer 12 (L) >60 mL/min   Anion gap 13 5 - 15    Comment: Performed at [facility], [place] [address] [place], [place] [address]  CBC     Status: Abnormal   Collection Time: 08/20/18  7:39 PM  Result Value Ref Range   WBC 7.0 4.0 - 10.5 K/uL   RBC 4.04 3.87 - 5.11 MIL/uL   Hemoglobin 12.7 12.0 - 15.0 g/dL   HCT 40.8 36.0 - 46.0 %   MCV 101.0 (H) 80.0 - 100.0 fL   MCH 31.4 26.0 - 34.0 pg   MCHC 31.1 30.0 - 36.0 g/dL   RDW 15.1 11.5 - 15.5 %   Platelets 286 150 - 400 K/uL   nRBC 0.0 0.0 - 0.2 %    Comment: Performed at [facility], [place] [address], [place], [place] [address]  Urinalysis, Routine w reflex microscopic     Status: Abnormal   Collection Time: 08/20/18 11:47 PM  Result Value Ref Range   Color, Urine AMBER (A) YELLOW    Comment: BIOCHEMICALS MAY BE AFFECTED BY COLOR   APPearance CLOUDY (A) CLEAR   Specific Gravity, Urine 1.017 1.005 - 1.030   pH 5.0 5.0 - 8.0   Glucose, UA NEGATIVE NEGATIVE mg/dL   Hgb urine dipstick SMALL (A) NEGATIVE   Bilirubin Urine NEGATIVE NEGATIVE   Ketones, ur 5 (A) NEGATIVE mg/dL   Protein, ur 100 (A) NEGATIVE mg/dL   Nitrite NEGATIVE NEGATIVE   Leukocytes,Ua LARGE (A) NEGATIVE   RBC / HPF 11-20 0 - 5 RBC/hpf   WBC, UA >50 (H) 0 - 5 WBC/hpf    Bacteria, UA RARE (A) NONE SEEN   Squamous Epithelial / LPF 6-10 0 - 5   WBC Clumps PRESENT    Mucus  PRESENT    Hyaline Casts, UA PRESENT    Non Squamous Epithelial 0-5 (A) NONE SEEN    Comment: Performed at [facility], [place] [address] [place], [place] [address]   Ct Abdomen Pelvis Wo Contrast  Result Date: 08/21/2018 CLINICAL DATA:  Persistent nausea, vomiting, diarrhea. Recent hospitalization for seen. EXAM: CT ABDOMEN AND PELVIS WITHOUT CONTRAST TECHNIQUE: Multidetector CT imaging of the abdomen and pelvis was performed following the standard protocol without IV contrast. COMPARISON:  CT 08/01/2018.  CT 07/27/2018 FINDINGS: Lower chest: Chronic scattered bilateral pulmonary opacities which are not significantly changed from prior exam, recently evaluated with chest CT. No acute airspace disease or pleural fluid. Hepatobiliary: No focal hepatic abnormality. Persistent high-density material in the gallbladder. No pericholecystic inflammation. No biliary dilatation. Pancreas: Fatty atrophy.  No ductal dilatation or inflammation. Spleen: Normal in size without focal abnormality. Adrenals/Urinary Tract: Normal adrenal glands. No hydronephrosis or perinephric edema. Unchanged cystic lesion in the right mid kidney from recent exams. Urinary bladder is nondistended. No bladder wall thickening. Stomach/Bowel: Small hiatal hernia. Stomach is nondistended. No bowel obstruction or inflammation. Similar fluid-filled small bowel from prior exam. Liquid stool in the proximal colon, fluid within the rectum. Minimal colonic diverticulosis. No diverticulitis. Colon without colonic wall thickening or inflammation. Vascular/Lymphatic: Small central mesenteric nodes, unchanged from prior exam. No enlarged lymph nodes in the abdomen or pelvis. Aortic atherosclerosis and tortuosity. No aneurysm. Reproductive: Unenhanced uterus and bilateral adnexa are unremarkable. Hypodensity in the uterus on contrast-enhanced exam  not appreciated. Other: No free air, free fluid, or intra-abdominal fluid collection. Musculoskeletal: Scoliosis and degenerative change. There are no acute or suspicious osseous abnormalities. IMPRESSION: 1. Similar fluid-filled bowel to prior exam, can be seen with unspecified enteritis. Liquid stool in portions of the colon consistent with diarrhea. No bowel inflammation. 2. High-density material in the gallbladder, favor sludge. 3. No other acute findings. Multiple chronic findings are stable from recent exams. 4.  Aortic Atherosclerosis (ICD10-I70.0). Electronically Signed   By: Keith Rake M.D.   On: 08/21/2018 00:31    Pending Labs Unresulted Labs (From admission, onward)    Start     Ordered   08/21/18 0093  Basic metabolic panel  Tomorrow morning,   R     08/21/18 0126   08/21/18 0128  Culture, Urine  ONCE - STAT,   STAT     08/21/18 0128   08/20/18 2343  Gastrointestinal Panel by PCR , Stool  (Gastrointestinal Panel by PCR, Stool)  Once,   STAT     08/20/18 2342   08/20/18 2342  SARS Coronavirus 2 (CEPHEID - Performed in [place] hospital lab), Hosp Order  (Asymptomatic Patients Labs)  Once,   STAT    Question:  Rule Out  Answer:  Yes   08/20/18 2342   08/20/18 2342  C difficile quick scan w PCR reflex  (C Difficile quick screen w PCR reflex panel)  Once, for 24 hours,   STAT     08/20/18 2342          Vitals/Pain Today's Vitals   08/20/18 1912 08/20/18 2147 08/21/18 0109 08/21/18 0115  BP:  122/89  129/70  Pulse:  86  81  Resp:  19    Temp:      TempSrc:      SpO2:  100%  100%  Weight: 63.5 kg  63.5 kg   Height: 5\' 3"  (1.6 m)  5\' 1"  (1.549 m)   PainSc: 0-No pain  Isolation Precautions Enteric precautions (UV disinfection)  Medications Medications  0.9 %  sodium chloride infusion (has no administration in time range)  acetaminophen (TYLENOL) tablet 650 mg (has no administration in time range)    Or  acetaminophen (TYLENOL) suppository 650 mg (has no  administration in time range)  ondansetron (ZOFRAN) tablet 4 mg (has no administration in time range)    Or  ondansetron (ZOFRAN) injection 4 mg (has no administration in time range)  cefTRIAXone (ROCEPHIN) 1 g in sodium chloride 0.9 % 100 mL IVPB (has no administration in time range)  sodium chloride 0.9 % bolus 1,000 mL (1,000 mLs Intravenous New Bag/Given 08/21/18 0105)    Mobility walks Low fall risk   Focused Assessments    R Recommendations: See Admitting Provider Note  Report given to:   Additional Notes:

## 2018-08-21 NOTE — Progress Notes (Signed)
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 Phenergan [Promethazine Hcl] Shortness Of Breath and Anxiety  . Amlodipine Swelling  . Clarithromycin Nausea Only    I can pass out   . Codeine Nausea Only    I can pass out   . Erythromycin     Other reaction(s): Vomiting (intolerance)  . Statins     Muscle pain   . Tetracycline Other (See Comments)    unknown    Patient Measurements: Height: 5\' 1"  (154.9 cm) Weight: 140 lb (63.5 kg) IBW/kg (Calculated) : 47.8  Vital Signs: Temp: 98.2 F (36.8 C) (07/07 0814) Temp Source: Oral (07/07 0814) BP: 141/70 (07/07 0814) Pulse Rate: 83 (07/07 0814)  Labs: Recent Labs    08/20/18 1939 08/21/18 0501  HGB 12.7  --   HCT 40.8  --   PLT 286  --   APTT  --  32  HEPARINUNFRC  --  <0.10*  CREATININE 3.89* 3.15*    Estimated Creatinine Clearance: 13.4 mL/min (A) (by C-G formula based on SCr of 3.15 mg/dL (H)).   Medical History: Past Medical History:  Diagnosis Date  . ALLERGIC RHINITIS   . ANEMIA-NOS   . Arthritis   . ASTHMA   . Carpal tunnel syndrome   . COLONIC POLYPS, HX OF   . Coronary artery disease    "mild CAD" noted on 12/05/17 in coronary CT scan  . DIABETES MELLITUS, TYPE II    diet controlled  . GERD   . HYPERLIPIDEMIA   . HYPERTENSION   . OSTEOPENIA   . PONV (postoperative nausea and vomiting)   . Psoriasis    severe, began soriatane 01/2012  . Rectal fissure   . Scoliosis    Assessment: On Xarelto PTA for hx PE, holding Xarelto and starting heparin during acute renal failure. Baseline CBC is good.   Transitioning to Eliquis today for prophylaxis for history of PE  Goal of Therapy:  Heparin level 0.3-0.7 units/ml Monitor platelets by anticoagulation protocol: Yes   Plan:  DC heparin and heparin labs Eliquis 2.5 mg po BID  Thank you Anette Guarneri, PharmD [phone]

## 2018-08-21 NOTE — H&P (Signed)
History and Physical    [name] TXM:[id number] DOB: 04-22-1945 DOA: 08/20/2018  PCP: [name], MD  Patient coming from: Home  I have personally briefly reviewed patient's old medical records in [place]  Chief Complaint: N/V/D  HPI: [name] is a 73 y.o. female with medical history significant of DM2, HTN, HLD.  Patient presents to ED with ongoing N/V/D for the past month.  She was admitted 6/17 to 6/26 with similar symptoms.  Extensive work up including C.Diff, GI pathogen panel, upper and lower endoscopy, CT abd/pelvis.  All failed to determine a cause.  She was trace heme positive, on chronic Xarelto, but didn't have acute GIB.  Empiric treatment with cipro/flagyl also didn't help.  Since that time N/V/D has persisted.  CT chest on 7/1 done for follow up of pulmonary nodules just showed slight improvement in areas of bilateral nodularity when compared to 12/2017 CT.  She returns to the ED today for ongoing symptoms of N/V/D.  No fevers, no sick contacts.   ED Course: CT abd/pelvis just shows similar fluid-filled bowel as with prior exam.  Sludge in gall bladder.  No other acute findings though multiple chronic findings (Scattered bilateral pulmonary opacities, R mid kidney cystic lesion, minimal colonic diverticulosis, small central mesenteric lymph nodes) are unchanged from prior exams.  Concerning today however is that her creatinine is 3.8 with BUN 53 up significantly from 0.8 and <5 on discharge 6/26.   Review of Systems: As per HPI otherwise 10 point review of systems negative.   Past Medical History:  Diagnosis Date  . ALLERGIC RHINITIS   . ANEMIA-NOS   . Arthritis   . ASTHMA   . Carpal tunnel syndrome   . COLONIC POLYPS, HX OF   . Coronary artery disease    "mild CAD" noted on 12/05/17 in coronary CT scan  . DIABETES MELLITUS, TYPE II    diet controlled  . GERD   . HYPERLIPIDEMIA   . HYPERTENSION   . OSTEOPENIA   . PONV  (postoperative nausea and vomiting)   . Psoriasis    severe, began soriatane 01/2012  . Rectal fissure   . Scoliosis     Past Surgical History:  Procedure Laterality Date  . benign rectal growth  2004   removed by Dr. Zella Richer  . BIOPSY  08/09/2018   Procedure: BIOPSY;  Surgeon: Carol Ada, MD;  Location: [place] [facility];  Service: Endoscopy;;  . BREAST BIOPSY Right   . BREAST EXCISIONAL BIOPSY Left   . BREAST SURGERY  1988   biopsy  . CESAREAN SECTION    . ESOPHAGOGASTRODUODENOSCOPY (EGD) WITH PROPOFOL N/A 08/04/2018   Procedure: ESOPHAGOGASTRODUODENOSCOPY (EGD) WITH PROPOFOL;  Surgeon: Ladene Artist, MD;  Location: [place];  Service: Gastroenterology;  Laterality: N/A;  . FLEXIBLE SIGMOIDOSCOPY N/A 08/04/2018   Procedure: FLEXIBLE SIGMOIDOSCOPY;  Surgeon: Ladene Artist, MD;  Location: [facility];  Service: Gastroenterology;  Laterality: N/A;  . FLEXIBLE SIGMOIDOSCOPY N/A 08/09/2018   Procedure: FLEXIBLE SIGMOIDOSCOPY;  Surgeon: Carol Ada, MD;  Location: [facility];  Service: Endoscopy;  Laterality: N/A;  . VIDEO BRONCHOSCOPY WITH ENDOBRONCHIAL NAVIGATION N/A 03/14/2018   Procedure: VIDEO BRONCHOSCOPY WITH ENDOBRONCHIAL NAVIGATION;  Surgeon: Collene Gobble, MD;  Location: [facility];  Service: Thoracic;  Laterality: N/A;     reports that she quit smoking about 44 years ago. She has a 10.00 pack-year smoking history. She has never used smokeless tobacco. She reports that she does not drink alcohol or  use drugs.  Allergies  Allergen Reactions  . Phenergan [Promethazine Hcl] Shortness Of Breath and Anxiety  . Amlodipine Swelling  . Clarithromycin Nausea Only    I can pass out   . Codeine Nausea Only    I can pass out   . Erythromycin     Other reaction(s): Vomiting (intolerance)  . Statins     Muscle pain   . Tetracycline Other (See Comments)    unknown    Family History  Problem Relation Age of Onset  . Lung cancer Father   . Arthritis Other        Parents   . Asthma Other        parent, other relative  . Breast cancer Other        other relative  . Hypertension Other        parent, other relative  . Heart disease Other        parent, other relative  . Heart disease Mother   . Asthma Mother   . Breast cancer Maternal Aunt 68  . Parkinson's disease Maternal Grandmother   . Rheumatic fever Maternal Grandfather      Prior to Admission medications   Medication Sig Start Date End Date Taking? Authorizing Provider  omeprazole (PRILOSEC) 20 MG capsule Take 20 mg by mouth daily.    Yes [provider]  ondansetron (ZOFRAN) 4 MG tablet Take 4 mg by mouth every 8 (eight) hours as needed for nausea or vomiting.   Yes [provider]  rivaroxaban (XARELTO) 20 MG TABS tablet Take 1 tablet (20 mg total) by mouth daily with supper. OK to restart this medication on 03/15/2018. 03/16/18  Yes Ollis, Brandi L, NP  blood glucose meter kit and supplies KIT Dispense based on patient and insurance preference. ONE TOUCH ULTRA Patient not taking: Reported on 08/21/2018 05/01/17   [name], MD  diphenoxylate-atropine (LOMOTIL) 2.5-0.025 MG tablet Take 2 tablets by mouth 3 (three) times daily. Patient not taking: Reported on 08/21/2018 08/10/18   Allie Bossier, MD  ezetimibe (ZETIA) 10 MG tablet Take 1 tablet (10 mg total) by mouth daily. Patient not taking: Reported on 08/21/2018 06/08/18   Dorothy Spark, MD  famotidine (PEPCID) 40 MG tablet Take 1 tablet (40 mg total) by mouth at bedtime. Patient not taking: Reported on 08/21/2018 08/10/18   Allie Bossier, MD  glucose blood (ONE TOUCH ULTRA TEST) test strip Use test strip to check blood sugar at least 3 times Patient not taking: Reported on 08/21/2018 05/01/17   [name], MD  Lancets [facility] ULTRASOFT) lancets Use lancet to test blood sugar at least twice a day or as directed by your physician. Patient not taking: Reported on 08/21/2018 05/01/17   [name], MD  nebivolol (BYSTOLIC) 5  MG tablet Take 1 tablet (5 mg total) by mouth daily. Patient not taking: Reported on 08/21/2018 08/10/18   Allie Bossier, MD  olmesartan (BENICAR) 20 MG tablet Take 1 tablet (20 mg total) by mouth 2 (two) times daily. Patient not taking: Reported on 08/21/2018 05/01/18   [name], MD  pravastatin (PRAVACHOL) 20 MG tablet Take 1 tablet (20 mg total) by mouth daily at 6 PM. Patient not taking: Reported on 08/21/2018 08/10/18   Allie Bossier, MD  scopolamine (TRANSDERM-SCOP) 1 MG/3DAYS Place 1 patch (1.5 mg total) onto the skin every 3 (three) days. Patient not taking: Reported on 08/21/2018 08/20/18   [name], MD  Physical Exam: Vitals:   08/20/18 1912 08/20/18 2147 08/21/18 0109 08/21/18 0115  BP:  122/89  129/70  Pulse:  86  81  Resp:  19    Temp:      TempSrc:      SpO2:  100%  100%  Weight: 63.5 kg  63.5 kg   Height: '5\' 3"'  (1.6 m)  '5\' 1"'  (1.549 m)     Constitutional: NAD, calm, comfortable Eyes: PERRL, lids and conjunctivae normal ENMT: Mucous membranes are moist. Posterior pharynx clear of any exudate or lesions.Normal dentition.  Neck: normal, supple, no masses, no thyromegaly Respiratory: clear to auscultation bilaterally, no wheezing, no crackles. Normal respiratory effort. No accessory muscle use.  Cardiovascular: Regular rate and rhythm, no murmurs / rubs / gallops. No extremity edema. 2+ pedal pulses. No carotid bruits.  Abdomen: no tenderness, no masses palpated. No hepatosplenomegaly. Bowel sounds positive.  Musculoskeletal: no clubbing / cyanosis. No joint deformity upper and lower extremities. Good ROM, no contractures. Normal muscle tone.  Skin: no rashes, lesions, ulcers. No induration Neurologic: CN 2-12 grossly intact. Sensation intact, DTR normal. Strength 5/5 in all 4.  Psychiatric: Normal judgment and insight. Alert and oriented x 3. Normal mood.    Labs on Admission: I have personally reviewed following labs and imaging studies  CBC: Recent Labs  Lab  08/20/18 1939  WBC 7.0  HGB 12.7  HCT 40.8  MCV 101.0*  PLT 284   Basic Metabolic Panel: Recent Labs  Lab 08/20/18 1939  NA 142  K 4.4  CL 118*  CO2 11*  GLUCOSE 95  BUN 53*  CREATININE 3.89*  CALCIUM 9.5   GFR: Estimated Creatinine Clearance: 10.8 mL/min (A) (by C-G formula based on SCr of 3.89 mg/dL (H)). Liver Function Tests: Recent Labs  Lab 08/20/18 1939  AST 24  ALT 30  ALKPHOS 69  BILITOT 1.5*  PROT 7.2  ALBUMIN 3.7   Recent Labs  Lab 08/20/18 1939  LIPASE 108*   No results for input(s): AMMONIA in the last 168 hours. Coagulation Profile: No results for input(s): INR, PROTIME in the last 168 hours. Cardiac Enzymes: No results for input(s): CKTOTAL, CKMB, CKMBINDEX, TROPONINI in the last 168 hours. BNP (last 3 results) No results for input(s): PROBNP in the last 8760 hours. HbA1C: No results for input(s): HGBA1C in the last 72 hours. CBG: No results for input(s): GLUCAP in the last 168 hours. Lipid Profile: No results for input(s): CHOL, HDL, LDLCALC, TRIG, CHOLHDL, LDLDIRECT in the last 72 hours. Thyroid Function Tests: No results for input(s): TSH, T4TOTAL, FREET4, T3FREE, THYROIDAB in the last 72 hours. Anemia Panel: No results for input(s): VITAMINB12, FOLATE, FERRITIN, TIBC, IRON, RETICCTPCT in the last 72 hours. Urine analysis:    Component Value Date/Time   COLORURINE AMBER (A) 08/20/2018 2347   APPEARANCEUR CLOUDY (A) 08/20/2018 2347   LABSPEC 1.017 08/20/2018 2347   PHURINE 5.0 08/20/2018 2347   GLUCOSEU NEGATIVE 08/20/2018 2347   GLUCOSEU NEGATIVE 10/13/2009 0843   HGBUR SMALL (A) 08/20/2018 2347   BILIRUBINUR NEGATIVE 08/20/2018 2347   KETONESUR 5 (A) 08/20/2018 2347   PROTEINUR 100 (A) 08/20/2018 2347   UROBILINOGEN 0.2 10/13/2009 0843   NITRITE NEGATIVE 08/20/2018 2347   LEUKOCYTESUR LARGE (A) 08/20/2018 2347    Radiological Exams on Admission: Ct Abdomen Pelvis Wo Contrast  Result Date: 08/21/2018 CLINICAL DATA:   Persistent nausea, vomiting, diarrhea. Recent hospitalization for seen. EXAM: CT ABDOMEN AND PELVIS WITHOUT CONTRAST TECHNIQUE: Multidetector CT imaging of the abdomen  and pelvis was performed following the standard protocol without IV contrast. COMPARISON:  CT 08/01/2018.  CT 07/27/2018 FINDINGS: Lower chest: Chronic scattered bilateral pulmonary opacities which are not significantly changed from prior exam, recently evaluated with chest CT. No acute airspace disease or pleural fluid. Hepatobiliary: No focal hepatic abnormality. Persistent high-density material in the gallbladder. No pericholecystic inflammation. No biliary dilatation. Pancreas: Fatty atrophy.  No ductal dilatation or inflammation. Spleen: Normal in size without focal abnormality. Adrenals/Urinary Tract: Normal adrenal glands. No hydronephrosis or perinephric edema. Unchanged cystic lesion in the right mid kidney from recent exams. Urinary bladder is nondistended. No bladder wall thickening. Stomach/Bowel: Small hiatal hernia. Stomach is nondistended. No bowel obstruction or inflammation. Similar fluid-filled small bowel from prior exam. Liquid stool in the proximal colon, fluid within the rectum. Minimal colonic diverticulosis. No diverticulitis. Colon without colonic wall thickening or inflammation. Vascular/Lymphatic: Small central mesenteric nodes, unchanged from prior exam. No enlarged lymph nodes in the abdomen or pelvis. Aortic atherosclerosis and tortuosity. No aneurysm. Reproductive: Unenhanced uterus and bilateral adnexa are unremarkable. Hypodensity in the uterus on contrast-enhanced exam not appreciated. Other: No free air, free fluid, or intra-abdominal fluid collection. Musculoskeletal: Scoliosis and degenerative change. There are no acute or suspicious osseous abnormalities. IMPRESSION: 1. Similar fluid-filled bowel to prior exam, can be seen with unspecified enteritis. Liquid stool in portions of the colon consistent with diarrhea.  No bowel inflammation. 2. High-density material in the gallbladder, favor sludge. 3. No other acute findings. Multiple chronic findings are stable from recent exams. 4.  Aortic Atherosclerosis (ICD10-I70.0). Electronically Signed   By: Keith Rake M.D.   On: 08/21/2018 00:31    EKG: Independently reviewed.  Assessment/Plan Principal Problem:   Acute kidney failure (HCC) Active Problems:   Diabetes type 2, controlled ([place])   Pulmonary embolism (HCC)   Nausea vomiting and diarrhea   Acute lower UTI    1. AKF - 1. Suspicious for ATN due to dehydration from persistent N/V/D 2. IVF: NS 1L bolus + 125 cc/hr 3. Strict intake and output 4. Repeat BMP in AM 5. No obstructive uropathy seen on CT 6. Further work up needed if not improving with IVF 2. N/V/D - 1. zofran PRN 2. IVF as above 3. Extensive work up done during admit x2 weeks ago was negative (see HPI and DC summary from 6/26). 4. Consult GI in AM 3. DM2 - 1. SSI sensitive Q4H 4. H/o PE - 1. Lifelong anticoagulation 2. Hold Xarelto due to AKF 3. Heparin gtt instead 5. UTI - 1. UCx 2. Rocephin  DVT prophylaxis: Heparin gtt instead of xarelto due to AKF Code Status: Full Family Communication: No family in room Disposition Plan: Home after admit Consults called: None Admission status: Admit to inpatient  Severity of Illness: The appropriate patient status for this patient is INPATIENT. Inpatient status is judged to be reasonable and necessary in order to provide the required intensity of service to ensure the patient's safety. The patient's presenting symptoms, physical exam findings, and initial radiographic and laboratory data in the context of their chronic comorbidities is felt to place them at high risk for further clinical deterioration. Furthermore, it is not anticipated that the patient will be medically stable for discharge from the hospital within 2 midnights of admission. The following factors support the  patient status of inpatient.   IP status for treatment of AKF with creat more than tripling from baseline.   * I certify that at the point of admission it is my  clinical judgment that the patient will require inpatient hospital care spanning beyond 2 midnights from the point of admission due to high intensity of service, high risk for further deterioration and high frequency of surveillance required.*    GARDNER, JARED M. DO [facility]  How to contact the [facility] Attending or Consulting provider [place] or covering provider during after hours [place], for this patient?  1. Check the care team in [facility] and look for a) attending/consulting [facility] provider listed and b) the [facility] team listed 2. Log into [email or web address]  Am[employer] Physician Scheduling and messaging for groups and whole hospitals  On call and physician scheduling software for group practices, residents, hospitalists and other medical providers for call, clinic, rotation and shift schedules. On[employer] is a hospital-wide system for scheduling doctors and paging doctors on call. EasyPlot is for scientific plotting and data analysis.  [email or web address]  and use [facility]'s universal password to access. If you do not have the password, please contact the hospital operator.  3. Locate the [facility] provider you are looking for under [facility] and page to a number that you can be directly reached. 4. If you still have difficulty reaching the provider, please page the [facility] (Director on Call) for the Hospitalists listed on amion for assistance.  08/21/2018, 1:30 AM

## 2018-08-21 NOTE — Consult Note (Addendum)
Reason for Consult: Severe diarrhea with nausea and vomiting. Referring Physician: THP.  [name] is an 73 y.o. female.  HPI: [name] is a 73 year old white female with multiple medical problems listed below; she was recently discharged from the hospital after she was admitted for acute diarrhea with nausea and intermittent vomiting from 6/17-6/26/20 1 in spite of extensive work-up no source of her diarrhea was found identified. Her daughter [name] called me yesterday saying that his mom but the symptoms had worsened and that she was not able to keep even water down and therefore she was advised to come to the emergency room for further evaluation and treatment. As per the patient's daughter she is lost over 14pounds since her discharge from the hospital due to poor oral intake. Patient had extensive GI work-up including GI panel for infectious causes by PCR a flexible sigmoidoscopy with biopsies and a CT scan of the abdomen and pelvis.  Patient is past due for a colonoscopy.  She is tried to schedule multiple office visits and canceled them as she feels she is not able to drink the prep for the procedure.  Her last colonoscopy was over 11 years ago. As she had been on Xarelto for her pulmonary embolus complete colonoscopy was not done on her last admission. However when Dr. Benson Norway attempted to the flexible sigmoidoscopy solid stool was noted in the rectosigmoid colon.  Due to her ATN secondary to diarrhea and dehydration patient has been started on Heparin the Xarelto has been held.  She says she is having 4-5 bowel movements per day and has been very nauseated but denies having any abdominal pain melena or hematochezia.  Multiple medications including Xifaxan and cholestyramine were tried on a previous admission but patient was unable to tolerate any of these medications. She is also been using artificial sweeteners on a regular basis till the day prior to admission as per my discussion  with her daughter yesterday. In spite of me repeatedly reminding the patient about the side effects of osmotic diarrhea caused by artificial sweeteners patient has not abstained from using these sweeteners. Overall noncompliance has been a big issue with her in my experience. On presenting to the emergency room she was found to have a BUN of 53 and a creatinine of 3.86 with significantly increased from a creatinine of 0.8 and BUN of 5 on discharge on 08/10/2018.  CT scan of the abdomen pelvis done on admission revealed an atrophic pancreas with no ductal dilatation inflammation and fluid-filled loops of small bowel consistent with enteritis. High density material in the gallbladder favoring sludge and aortic atherosclerosis was noted.  Past Medical History:  Diagnosis Date  . ALLERGIC RHINITIS   . ANEMIA-NOS   . Arthritis   . ASTHMA   . Carpal tunnel syndrome   . COLONIC POLYPS, HX OF   . Coronary artery disease    "mild CAD" noted on 12/05/17 in coronary CT scan  . DIABETES MELLITUS, TYPE II    diet controlled  . GERD   . HYPERLIPIDEMIA   . HYPERTENSION   . OSTEOPENIA   . PONV (postoperative nausea and vomiting)   . Psoriasis    severe, began soriatane 01/2012  . Rectal fissure   . Scoliosis    Past Surgical History:  Procedure Laterality Date  . benign rectal growth  2004   removed by Dr. Zella Richer  . BIOPSY  08/09/2018   Procedure: BIOPSY;  Surgeon: Carol Ada, MD;  Location: [place];  Service: Endoscopy;;  . BREAST BIOPSY Right   . BREAST EXCISIONAL BIOPSY Left   . BREAST SURGERY  1988   biopsy  . CESAREAN SECTION    . ESOPHAGOGASTRODUODENOSCOPY (EGD) WITH PROPOFOL N/A 08/04/2018   Procedure: ESOPHAGOGASTRODUODENOSCOPY (EGD) WITH PROPOFOL;  Surgeon: Ladene Artist, MD;  Location: [place];  Service: Gastroenterology;  Laterality: N/A;  . FLEXIBLE SIGMOIDOSCOPY N/A 08/04/2018   Procedure: FLEXIBLE SIGMOIDOSCOPY;  Surgeon: Ladene Artist, MD;  Location: [place];  Service: Gastroenterology;  Laterality: N/A;  . FLEXIBLE SIGMOIDOSCOPY N/A 08/09/2018   Procedure: FLEXIBLE SIGMOIDOSCOPY;  Surgeon: Carol Ada, MD;  Location: [place];  Service: Endoscopy;  Laterality: N/A;  . VIDEO BRONCHOSCOPY WITH ENDOBRONCHIAL NAVIGATION N/A 03/14/2018   Procedure: VIDEO BRONCHOSCOPY WITH ENDOBRONCHIAL NAVIGATION;  Surgeon: Collene Gobble, MD;  Location[facility];  Service: Thoracic;  Laterality: N/A;   Family History  Problem Relation Age of Onset  . Lung cancer Father   . Arthritis Other        Parents  . Asthma Other        parent, other relative  . Breast cancer Other        other relative  . Hypertension Other        parent, other relative  . Heart disease Other        parent, other relative  . Heart disease Mother   . Asthma Mother   . Breast cancer Maternal Aunt 68  . Parkinson's disease Maternal Grandmother   . Rheumatic fever Maternal Grandfather    Social History:  reports that she quit smoking about 44 years ago. She has a 10.00 pack-year smoking history. She has never used smokeless tobacco. She reports that she does not drink alcohol or use drugs.  Allergies:  Allergies  Allergen Reactions  . Phenergan [Promethazine Hcl] Shortness Of Breath and Anxiety  . Amlodipine Swelling  . Clarithromycin Nausea Only    I can pass out   . Codeine Nausea Only    I can pass out   . Erythromycin     Other reaction(s): Vomiting (intolerance)  . Statins     Muscle pain   . Tetracycline Other (See Comments)    unknown   Medications: I have reviewed the patient's current medications.  Results for orders placed or performed during the hospital encounter of 08/20/18 (from the past 48 hour(s))  Lipase, blood     Status: Abnormal   Collection Time: 08/20/18  7:39 PM  Result Value Ref Range   Lipase 108 (H) 11 - 51 U/L    Comment: Performed at [facility], [place] [address], [place], [place] [address]  Comprehensive metabolic panel      Status: Abnormal   Collection Time: 08/20/18  7:39 PM  Result Value Ref Range   Sodium 142 135 - 145 mmol/L   Potassium 4.4 3.5 - 5.1 mmol/L   Chloride 118 (H) 98 - 111 mmol/L   CO2 11 (L) 22 - 32 mmol/L   Glucose, Bld 95 70 - 99 mg/dL   BUN 53 (H) 8 - 23 mg/dL   Creatinine, Ser 3.89 (H) 0.44 - 1.00 mg/dL   Calcium 9.5 8.9 - 10.3 mg/dL   Total Protein 7.2 6.5 - 8.1 g/dL   Albumin 3.7 3.5 - 5.0 g/dL   AST 24 15 - 41 U/L   ALT 30 0 - 44 U/L   Alkaline Phosphatase 69 38 - 126 U/L   Total Bilirubin 1.5 (H) 0.3 -  1.2 mg/dL   GFR calc non Af Amer 11 (L) >60 mL/min   GFR calc Af Amer 12 (L) >60 mL/min   Anion gap 13 5 - 15    Comment: Performed at [facility] [address], [place], [place] [address]  CBC     Status: Abnormal   Collection Time: 08/20/18  7:39 PM  Result Value Ref Range   WBC 7.0 4.0 - 10.5 K/uL   RBC 4.04 3.87 - 5.11 MIL/uL   Hemoglobin 12.7 12.0 - 15.0 g/dL   HCT 40.8 36.0 - 46.0 %   MCV 101.0 (H) 80.0 - 100.0 fL   MCH 31.4 26.0 - 34.0 pg   MCHC 31.1 30.0 - 36.0 g/dL   RDW 15.1 11.5 - 15.5 %   Platelets 286 150 - 400 K/uL   nRBC 0.0 0.0 - 0.2 %    Comment: Performed at [facility], [place] [address], [place], [place] [address]  Urinalysis, Routine w reflex microscopic     Status: Abnormal   Collection Time: 08/20/18 11:47 PM  Result Value Ref Range   Color, Urine AMBER (A) YELLOW    Comment: BIOCHEMICALS MAY BE AFFECTED BY COLOR   APPearance CLOUDY (A) CLEAR   Specific Gravity, Urine 1.017 1.005 - 1.030   pH 5.0 5.0 - 8.0   Glucose, UA NEGATIVE NEGATIVE mg/dL   Hgb urine dipstick SMALL (A) NEGATIVE   Bilirubin Urine NEGATIVE NEGATIVE   Ketones, ur 5 (A) NEGATIVE mg/dL   Protein, ur 100 (A) NEGATIVE mg/dL   Nitrite NEGATIVE NEGATIVE   Leukocytes,Ua LARGE (A) NEGATIVE   RBC / HPF 11-20 0 - 5 RBC/hpf   WBC, UA >50 (H) 0 - 5 WBC/hpf   Bacteria, UA RARE (A) NONE SEEN   Squamous Epithelial / LPF 6-10 0 - 5   WBC Clumps PRESENT    Mucus PRESENT     Hyaline Casts, UA PRESENT    Non Squamous Epithelial 0-5 (A) NONE SEEN    Comment: Performed at [facility], [place] [address], [place], [place] [address]  SARS Coronavirus 2 (CEPHEID - Performed in [place] hospital lab), Hosp Order     Status: None   Collection Time: 08/21/18  1:07 AM   Specimen: Nasopharyngeal Swab  Result Value Ref Range   SARS Coronavirus 2 NEGATIVE NEGATIVE    Comment: (NOTE) If result is NEGATIVE SARS-CoV-2 target nucleic acids are NOT DETECTED. The SARS-CoV-2 RNA is generally detectable in upper and lower  respiratory specimens during the acute phase of infection. The lowest  concentration of SARS-CoV-2 viral copies this assay can detect is 250  copies / mL. A negative result does not preclude SARS-CoV-2 infection  and should not be used as the sole basis for treatment or other  patient management decisions.  A negative result may occur with  improper specimen collection / handling, submission of specimen other  than nasopharyngeal swab, presence of viral mutation(s) within the  areas targeted by this assay, and inadequate number of viral copies  (<250 copies / mL). A negative result must be combined with clinical  observations, patient history, and epidemiological information. If result is POSITIVE SARS-CoV-2 target nucleic acids are DETECTED. The SARS-CoV-2 RNA is generally detectable in upper and lower  respiratory specimens dur ing the acute phase of infection.  Positive  results are indicative of active infection with SARS-CoV-2.  Clinical  correlation with patient history and other diagnostic information is  necessary to  determine patient infection status.  Positive results do  not rule out bacterial infection or co-infection with other viruses. If result is PRESUMPTIVE POSTIVE SARS-CoV-2 nucleic acids MAY BE PRESENT.   A presumptive positive result was obtained on the submitted specimen  and confirmed on repeat testing.  While 2019 novel  coronavirus  (SARS-CoV-2) nucleic acids may be present in the submitted sample  additional confirmatory testing may be necessary for epidemiological  and / or clinical management purposes  to differentiate between  SARS-CoV-2 and other Sarbecovirus currently known to infect humans.  If clinically indicated additional testing with an alternate test  methodology [phone]) is advised. The SARS-CoV-2 RNA is generally  detectable in upper and lower respiratory sp ecimens during the acute  phase of infection. The expected result is Negative. Fact Sheet for Patients:  StrictlyIdeas.no Fact Sheet for Healthcare Providers: BankingDealers.co.za This test is not yet approved or cleared by the [place] FDA and has been authorized for detection and/or diagnosis of SARS-CoV-2 by FDA under an Emergency Use Authorization (EUA).  This EUA will remain in effect (meaning this test can be used) for the duration of the COVID-19 declaration under Section 564(b)(1) of the Act, 21 U.S.C. section 360bbb-3(b)(1), unless the authorization is terminated or revoked sooner. Performed at [facility], [place] [address] [place], [place] [address]   Glucose, capillary     Status: None   Collection Time: 08/21/18  4:25 AM  Result Value Ref Range   Glucose-Capillary 88 70 - 99 mg/dL  Basic metabolic panel     Status: Abnormal   Collection Time: 08/21/18  5:01 AM  Result Value Ref Range   Sodium 147 (H) 135 - 145 mmol/L   Potassium 4.0 3.5 - 5.1 mmol/L   Chloride 123 (H) 98 - 111 mmol/L   CO2 13 (L) 22 - 32 mmol/L   Glucose, Bld 91 70 - 99 mg/dL   BUN 52 (H) 8 - 23 mg/dL   Creatinine, Ser 3.15 (H) 0.44 - 1.00 mg/dL   Calcium 8.8 (L) 8.9 - 10.3 mg/dL   GFR calc non Af Amer 14 (L) >60 mL/min   GFR calc Af Amer 16 (L) >60 mL/min   Anion gap 11 5 - 15    Comment: Performed at [facility] [address], [place], [place] [address]  Heparin level  (unfractionated)     Status: Abnormal   Collection Time: 08/21/18  5:01 AM  Result Value Ref Range   Heparin Unfractionated <0.10 (L) 0.30 - 0.70 IU/mL    Comment: (NOTE) If heparin results are below expected values, and patient dosage has  been confirmed, suggest follow up testing of antithrombin III levels. Performed at [facility], [place] [address], [place], [place] [address]   APTT     Status: None   Collection Time: 08/21/18  5:01 AM  Result Value Ref Range   aPTT 32 24 - 36 seconds    Comment: Performed at [facility] [address] [place], [place] [address]  Glucose, capillary     Status: None   Collection Time: 08/21/18  7:48 AM  Result Value Ref Range   Glucose-Capillary 77 70 - 99 mg/dL   Ct Abdomen Pelvis Wo Contrast  Result Date: 08/21/2018 CLINICAL DATA:  Persistent nausea, vomiting, diarrhea. Recent hospitalization for seen. EXAM: CT ABDOMEN AND PELVIS WITHOUT CONTRAST TECHNIQUE: Multidetector CT imaging of the abdomen and pelvis was performed following the standard protocol without IV contrast.  COMPARISON:  CT 08/01/2018.  CT 07/27/2018 FINDINGS: Lower chest: Chronic scattered bilateral pulmonary opacities which are not significantly changed from prior exam, recently evaluated with chest CT. No acute airspace disease or pleural fluid. Hepatobiliary: No focal hepatic abnormality. Persistent high-density material in the gallbladder. No pericholecystic inflammation. No biliary dilatation. Pancreas: Fatty atrophy.  No ductal dilatation or inflammation. Spleen: Normal in size without focal abnormality. Adrenals/Urinary Tract: Normal adrenal glands. No hydronephrosis or perinephric edema. Unchanged cystic lesion in the right mid kidney from recent exams. Urinary bladder is nondistended. No bladder wall thickening. Stomach/Bowel: Small hiatal hernia. Stomach is nondistended. No bowel obstruction or inflammation. Similar fluid-filled small bowel from prior exam. Liquid  stool in the proximal colon, fluid within the rectum. Minimal colonic diverticulosis. No diverticulitis. Colon without colonic wall thickening or inflammation. Vascular/Lymphatic: Small central mesenteric nodes, unchanged from prior exam. No enlarged lymph nodes in the abdomen or pelvis. Aortic atherosclerosis and tortuosity. No aneurysm. Reproductive: Unenhanced uterus and bilateral adnexa are unremarkable. Hypodensity in the uterus on contrast-enhanced exam not appreciated. Other: No free air, free fluid, or intra-abdominal fluid collection. Musculoskeletal: Scoliosis and degenerative change. There are no acute or suspicious osseous abnormalities. IMPRESSION: 1. Similar fluid-filled bowel to prior exam, can be seen with unspecified enteritis. Liquid stool in portions of the colon consistent with diarrhea. No bowel inflammation. 2. High-density material in the gallbladder, favor sludge. 3. No other acute findings. Multiple chronic findings are stable from recent exams. 4.  Aortic Atherosclerosis (ICD10-I70.0). Electronically Signed   By: Keith Rake M.D.   On: 08/21/2018 00:31   ROS Blood pressure (!) 141/70, pulse 83, temperature 98.2 F (36.8 C), temperature source Oral, resp. rate 18, height 5\' 1"  (1.549 m), weight 63.5 kg, SpO2 100 %. Physical Exam  Constitutional: She is oriented to person, place, and time. She appears well-developed and well-nourished.  HENT:  Head: Normocephalic and atraumatic.  Eyes: Pupils are equal, round, and reactive to light. Conjunctivae and EOM are normal.  Neck: Normal range of motion. Neck supple.  Cardiovascular: Normal rate and regular rhythm.  GI: Soft. Bowel sounds are normal. She exhibits no distension and no mass. There is no abdominal tenderness. There is no rebound and no guarding.  Musculoskeletal: Normal range of motion.  Neurological: She is alert and oriented to person, place, and time.  Skin: Skin is warm and dry.  Psychiatric: She has a normal  mood and affect. Her behavior is normal. Judgment and thought content normal.   Assessment/Plan: 1) Severe diarrhea nausea and vomiting causing ATN-plan to do a call unprepped colonoscopy on the patient tomorrow and obtain mucosal biopsies to rule out microscopic colitis. Plans are to hold the heparin for 4 hours prior to the procedure. Additionally due to the fatty atrophic pancreas exocrine pancreatic insufficiency-EPI might be contributing to her diarrhea. Therefore pancreatic enzyme supplementation has been suggested and I will have the pharmacy dose this for her.Marland Kitchen 2) GERD-On PPI's. 3)History of a PE on Xarelto at home currently on heparin Xarelto is been held. 4) Type 2 diabetes mellitus on sliding scale coverage. 5) UTI-on Rocephin. 6) HTN/Hyperlipidemia. 7) Asthma. 8) ?Malnutrition please check prealbumin level  Juanita Craver 08/21/2018, 10:45 AM

## 2018-08-21 NOTE — Progress Notes (Signed)
Initial Nutrition Assessment  DOCUMENTATION CODES:   Not applicable  INTERVENTION:    Magic cup TID with meals, each supplement provides 290 kcal and 9 grams of protein  MVI daily  NUTRITION DIAGNOSIS:   Inadequate oral intake related to nausea, vomiting as evidenced by per patient/family report.  GOAL:   Patient will meet greater than or equal to 90% of their needs  MONITOR:   PO intake, Supplement acceptance, Skin, Weight trends, Labs, I & O's  REASON FOR ASSESSMENT:   Malnutrition Screening Tool    ASSESSMENT:   Patient with PMH significant for HLD, HTN, DM, chronic renal fissure, GERD, and CAD. Recently admitted 6/17 for acute enteritis. Presents this admission with compliant of N/V/D. Found to have AKF suspicious for ATN.   Unable to reach pt by phone. Last admission pt has poor appetite and ate minimally off meal trays. She was non-compliant with Glucerna or Ensure. It was reported that she likes ice cream so will continue with Magic Cup. Meal completion charted as 100% for pt's last meal.   Weight shows to fluctuate over the last year. Recent admission weight of 140 lb looks to be stated. Will need to obtain actual weight reading to assess for weight loss.   Drips: 1/2NS @ 75 ml/hr  Medications: SS novolog Labs: Na 147 (H) CBG 77-108 Cr 3.15-trending down  Diet Order:   Diet Order            Diet heart healthy/carb modified Room service appropriate? Yes; Fluid consistency: Thin  Diet effective now              EDUCATION NEEDS:   Not appropriate for education at this time  Skin:  Skin Assessment: Reviewed RN Assessment  Last BM:  7/7  Height:   Ht Readings from Last 1 Encounters:  08/21/18 5\' 1"  (1.549 m)    Weight:   Wt Readings from Last 1 Encounters:  08/21/18 63.5 kg    Ideal Body Weight:  47.7 kg  BMI:  Body mass index is 26.45 kg/m.  Estimated Nutritional Needs:   Kcal:  1500-1700 kcal  Protein:  75-90 grams  Fluid:  >/=  1.5 L/day   Mariana Single RD, LDN Clinical Nutrition Pager # - [phone]

## 2018-08-21 NOTE — Progress Notes (Signed)
PROGRESS NOTE    [name]  KGU:[id number] DOB: Feb 24, 1945 DOA: 08/20/2018 PCP: [name], MD    Brief Narrative:  73 year old female who presented with nausea, vomiting and diarrhea.  He does have significant past medical history for type 2 diabetes mellitus, hypertension and dyslipidemia.  She reported symptoms persistent for the last month, hospitalization June 17 to June 26 for similar symptoms.  She had extensive gastrointestinal work-up with no conclusive diagnosis.  Initial physical examination blood pressure 122/89, heart rate 86, respirate 19, oxygen saturation 100%, moist mucous membranes, lungs clear to auscultation bilaterally, heart S1-S2 present and rhythmic, abdomen soft nontender, no lower extremity edema sodium 142, potassium 4.4, chloride 118, bicarb 11, glucose 95, BUN 53, creatinine 3.89, white count 7.0, hemoglobin 12.7, hematocrit 40.8, platelets 286, SARS COVID-19 was negative, urinalysis more than 50 white cells, 11-20 red cells, specific gravity 1.017, 100 protein.  CT of the abdomen of the abdomen with fluid-filled bowel, no acute changes.  Patient was admitted to the hospital working diagnosis of acute kidney injury due to acute diarrhea.  Assessment & Plan:   Principal Problem:   Acute kidney failure ([place]) Active Problems:   Diabetes type 2, controlled ([place])   Pulmonary embolism ([facility])   Nausea vomiting and diarrhea   Acute lower UTI   1. Hypovolemic AKI, with non anion gap metabolic acidosis. Patient with severe diarrhea at home with poor oral intake, renal function with serum cr at 3.15 with K at 4,0 and serum bicarbonate at 13, anion gap 11. Patient has developed hypernatremia and hyperchloremia, likely due to normal saline fluids resuscitation, will change to hypotonic saline with 0.45% saline at 75 ml per H, will follow on renal panel in am. Advance diet as tolerated.    2. Acute on chronic diarrhea. Will continue supportive medical therapy with  IV fluids. Recent full GI workup on last hospitalization, full gastrointestinal infectious panel including c diff was negative,  will add lomotil as needed.    3. Pulmonary embolism. Patient on rivaroxaban at home, CT chest from November 2019, with non occlusive filling defect in the distal left main pulmonary artery ( personally reviewed old records). Will continue anticoagulation with renal dose of apixaban and will dc heparin drip.   4. Urinary tract infection. Continue anticoagulation with ceftriaxone, patient reports positive urinary symptoms, follow on cultures.   5, T2DM. Will advance diet, continue glucose cover and monitoring with insulin sliding scale.   DVT prophylaxis: apixaban  Code Status: full Family Communication: no family at the bedside  Disposition Plan/ discharge barriers: pending clinical improvement   Body mass index is 26.45 kg/m. Malnutrition Type:      Malnutrition Characteristics:      Nutrition Interventions:     RN Pressure Injury Documentation:     Consultants:   GI   Procedures:     Antimicrobials:       Subjective: Patient continue to be very weak and deconditioned, no significant diarrhea this am, with no nausea or vomiting.   Objective: Vitals:   08/21/18 0145 08/21/18 0214 08/21/18 0426 08/21/18 0814  BP: 120/64 115/64 123/71 (!) 141/70  Pulse: 80 88 74 83  Resp:  18 18 18   Temp:  98.3 F (36.8 C) 98 F (36.7 C) 98.2 F (36.8 C)  TempSrc:  Oral  Oral  SpO2: 99% 100% 97% 100%  Weight:      Height:        Intake/Output Summary (Last 24 hours) at 08/21/2018 1208  Last data filed at 08/21/2018 0900 Gross per 24 hour  Intake 100 ml  Output 0 ml  Net 100 ml   Filed Weights   08/20/18 1912 08/21/18 0109  Weight: 63.5 kg 63.5 kg    Examination:   General: Not in pain or dyspnea, deconditioned  Neurology: Awake and alert, non focal  E ENT: mild pallor, no icterus, oral mucosa moist Cardiovascular: No JVD. S1-S2  present, rhythmic, no gallops, rubs, or murmurs. No lower extremity edema. Pulmonary: positive breath sounds bilaterally, adequate air movement, no wheezing, rhonchi or rales. Gastrointestinal. Abdomen distended with, no organomegaly, non tender, no rebound or guarding Skin. No rashes Musculoskeletal: no joint deformities     Data Reviewed: I have personally reviewed following labs and imaging studies  CBC: Recent Labs  Lab 08/20/18 1939  WBC 7.0  HGB 12.7  HCT 40.8  MCV 101.0*  PLT 440   Basic Metabolic Panel: Recent Labs  Lab 08/20/18 1939 08/21/18 0501  NA 142 147*  K 4.4 4.0  CL 118* 123*  CO2 11* 13*  GLUCOSE 95 91  BUN 53* 52*  CREATININE 3.89* 3.15*  CALCIUM 9.5 8.8*   GFR: Estimated Creatinine Clearance: 13.4 mL/min (A) (by C-G formula based on SCr of 3.15 mg/dL (H)). Liver Function Tests: Recent Labs  Lab 08/20/18 1939  AST 24  ALT 30  ALKPHOS 69  BILITOT 1.5*  PROT 7.2  ALBUMIN 3.7   Recent Labs  Lab 08/20/18 1939  LIPASE 108*   No results for input(s): AMMONIA in the last 168 hours. Coagulation Profile: No results for input(s): INR, PROTIME in the last 168 hours. Cardiac Enzymes: No results for input(s): CKTOTAL, CKMB, CKMBINDEX, TROPONINI in the last 168 hours. BNP (last 3 results) No results for input(s): PROBNP in the last 8760 hours. HbA1C: No results for input(s): HGBA1C in the last 72 hours. CBG: Recent Labs  Lab 08/21/18 0212 08/21/18 0425 08/21/18 0748 08/21/18 1151  GLUCAP 77 88 77 108*   Lipid Profile: No results for input(s): CHOL, HDL, LDLCALC, TRIG, CHOLHDL, LDLDIRECT in the last 72 hours. Thyroid Function Tests: No results for input(s): TSH, T4TOTAL, FREET4, T3FREE, THYROIDAB in the last 72 hours. Anemia Panel: No results for input(s): VITAMINB12, FOLATE, FERRITIN, TIBC, IRON, RETICCTPCT in the last 72 hours.    Radiology Studies: I have reviewed all of the imaging during this hospital visit  personally     Scheduled Meds: . insulin aspart  0-9 Units Subcutaneous Q4H   Continuous Infusions: . sodium chloride 125 mL/hr at 08/21/18 0236  . cefTRIAXone (ROCEPHIN)  IV 1 g (08/21/18 0300)  . heparin 900 Units/hr (08/21/18 0622)     LOS: 0 days        Mauricio Gerome Apley, MD

## 2018-08-21 NOTE — Progress Notes (Signed)
Put collection hat for stool sample on commode and while patient was using commode knocked the hat into toilet.  Will continue to try and obtain stool sample

## 2018-08-22 ENCOUNTER — Inpatient Hospital Stay (HOSPITAL_COMMUNITY): Payer: Medicare Other | Admitting: Anesthesiology

## 2018-08-22 ENCOUNTER — Encounter (HOSPITAL_COMMUNITY): Payer: Self-pay | Admitting: *Deleted

## 2018-08-22 ENCOUNTER — Encounter (HOSPITAL_COMMUNITY)
Admission: EM | Disposition: A | Discharge status: Home Health | Payer: Self-pay | Source: Home / Self Care | Attending: Internal Medicine

## 2018-08-22 DIAGNOSIS — R1110 Vomiting, unspecified: Secondary | ICD-10-CM

## 2018-08-22 DIAGNOSIS — E119 Type 2 diabetes mellitus without complications: Secondary | ICD-10-CM

## 2018-08-22 DIAGNOSIS — E860 Dehydration: Secondary | ICD-10-CM

## 2018-08-22 HISTORY — PX: BIOPSY: SHX5522

## 2018-08-22 HISTORY — PX: COLONOSCOPY: SHX5424

## 2018-08-22 LAB — MAGNESIUM: Magnesium: 1.4 mg/dL — ABNORMAL LOW (ref 1.7–2.4)

## 2018-08-22 LAB — CBC WITH DIFFERENTIAL/PLATELET
Abs Immature Granulocytes: 0.04 10*3/uL (ref 0.00–0.07)
Basophils Absolute: 0.1 10*3/uL (ref 0.0–0.1)
Basophils Relative: 1 %
Eosinophils Absolute: 0.2 10*3/uL (ref 0.0–0.5)
Eosinophils Relative: 3 %
HCT: 33.8 % — ABNORMAL LOW (ref 36.0–46.0)
Hemoglobin: 11 g/dL — ABNORMAL LOW (ref 12.0–15.0)
Immature Granulocytes: 1 %
Lymphocytes Relative: 20 %
Lymphs Abs: 1.5 10*3/uL (ref 0.7–4.0)
MCH: 31.6 pg (ref 26.0–34.0)
MCHC: 32.5 g/dL (ref 30.0–36.0)
MCV: 97.1 fL (ref 80.0–100.0)
Monocytes Absolute: 0.6 10*3/uL (ref 0.1–1.0)
Monocytes Relative: 8 %
Neutro Abs: 5.1 10*3/uL (ref 1.7–7.7)
Neutrophils Relative %: 67 %
Platelets: 221 10*3/uL (ref 150–400)
RBC: 3.48 MIL/uL — ABNORMAL LOW (ref 3.87–5.11)
RDW: 15 % (ref 11.5–15.5)
WBC: 7.5 10*3/uL (ref 4.0–10.5)
nRBC: 0 % (ref 0.0–0.2)

## 2018-08-22 LAB — GLUCOSE, CAPILLARY
Glucose-Capillary: 108 mg/dL — ABNORMAL HIGH (ref 70–99)
Glucose-Capillary: 76 mg/dL (ref 70–99)
Glucose-Capillary: 81 mg/dL (ref 70–99)
Glucose-Capillary: 81 mg/dL (ref 70–99)
Glucose-Capillary: 83 mg/dL (ref 70–99)
Glucose-Capillary: 88 mg/dL (ref 70–99)
Glucose-Capillary: 97 mg/dL (ref 70–99)

## 2018-08-22 LAB — BASIC METABOLIC PANEL
Anion gap: 10 (ref 5–15)
BUN: 39 mg/dL — ABNORMAL HIGH (ref 8–23)
CO2: 13 mmol/L — ABNORMAL LOW (ref 22–32)
Calcium: 8.7 mg/dL — ABNORMAL LOW (ref 8.9–10.3)
Chloride: 120 mmol/L — ABNORMAL HIGH (ref 98–111)
Creatinine, Ser: 2.65 mg/dL — ABNORMAL HIGH (ref 0.44–1.00)
GFR calc Af Amer: 20 mL/min — ABNORMAL LOW (ref >60)
GFR calc non Af Amer: 17 mL/min — ABNORMAL LOW (ref >60)
Glucose, Bld: 90 mg/dL (ref 70–99)
Potassium: 3.2 mmol/L — ABNORMAL LOW (ref 3.5–5.1)
Sodium: 143 mmol/L (ref 135–145)

## 2018-08-22 SURGERY — COLONOSCOPY
Anesthesia: Monitor Anesthesia Care

## 2018-08-22 MED ORDER — POTASSIUM CHLORIDE CRYS ER 20 MEQ PO TBCR
40.0000 meq | EXTENDED_RELEASE_TABLET | Freq: Once | ORAL | Status: AC
  Administered 2018-08-22: 40 meq via ORAL
  Filled 2018-08-22: qty 2

## 2018-08-22 MED ORDER — PROPOFOL 10 MG/ML IV BOLUS
INTRAVENOUS | Status: DC | PRN
  Administered 2018-08-22: 20 mg via INTRAVENOUS
  Administered 2018-08-22: 10 mg via INTRAVENOUS

## 2018-08-22 MED ORDER — SODIUM BICARBONATE 8.4 % IV SOLN
INTRAVENOUS | Status: DC
  Administered 2018-08-22 – 2018-08-23 (×3): via INTRAVENOUS
  Filled 2018-08-22 (×4): qty 150

## 2018-08-22 MED ORDER — ONDANSETRON HCL 4 MG/2ML IJ SOLN
INTRAMUSCULAR | Status: DC | PRN
  Administered 2018-08-22: 4 mg via INTRAVENOUS

## 2018-08-22 MED ORDER — LACTATED RINGERS IV SOLN
INTRAVENOUS | Status: DC | PRN
  Administered 2018-08-22: 15:00:00 via INTRAVENOUS

## 2018-08-22 MED ORDER — APIXABAN 2.5 MG PO TABS
2.5000 mg | ORAL_TABLET | Freq: Two times a day (BID) | ORAL | Status: DC
  Administered 2018-08-22 – 2018-08-29 (×14): 2.5 mg via ORAL
  Filled 2018-08-22 (×14): qty 1

## 2018-08-22 MED ORDER — SODIUM CHLORIDE 0.9 % IV SOLN: INTRAVENOUS | Status: DC

## 2018-08-22 MED ORDER — MAGNESIUM SULFATE 4 GM/100ML IV SOLN
4.0000 g | Freq: Once | INTRAVENOUS | Status: AC
  Administered 2018-08-22: 4 g via INTRAVENOUS
  Filled 2018-08-22: qty 100

## 2018-08-22 MED ORDER — LACTATED RINGERS IV SOLN: INTRAVENOUS | Status: DC

## 2018-08-22 MED ORDER — OXYMETAZOLINE HCL 0.05 % NA SOLN
1.0000 | Freq: Two times a day (BID) | NASAL | Status: DC | PRN
  Administered 2018-08-23: 1 via NASAL
  Filled 2018-08-22: qty 30

## 2018-08-22 MED ORDER — LIDOCAINE HCL (CARDIAC) PF 100 MG/5ML IV SOSY
PREFILLED_SYRINGE | INTRAVENOUS | Status: DC | PRN
  Administered 2018-08-22: 50 mg via INTRAVENOUS

## 2018-08-22 MED ORDER — PROPOFOL 500 MG/50ML IV EMUL
INTRAVENOUS | Status: DC | PRN
  Administered 2018-08-22: 100 ug/kg/min via INTRAVENOUS

## 2018-08-22 NOTE — Progress Notes (Signed)
[facility] for Apixaban Indication: History of PE (11/19)  Allergies  Allergen Reactions  . Phenergan [Promethazine Hcl] Shortness Of Breath and Anxiety  . Amlodipine Swelling  . Clarithromycin Nausea Only    I can pass out   . Codeine Nausea Only    I can pass out   . Erythromycin     Other reaction(s): Vomiting (intolerance)  . Statins     Muscle pain   . Tetracycline Other (See Comments)    unknown    Patient Measurements: Height: 5\' 1"  (154.9 cm) Weight: 140 lb (63.5 kg) IBW/kg (Calculated) : 47.8  Vital Signs: Temp: 98.4 F (36.9 C) (07/08 1637) Temp Source: Oral (07/08 1637) BP: 145/80 (07/08 1637) Pulse Rate: 70 (07/08 1637)  Labs: Recent Labs    08/20/18 1939 08/21/18 0501 08/22/18 0520 08/22/18 1117  HGB 12.7  --   --  11.0*  HCT 40.8  --   --  33.8*  PLT 286  --   --  221  APTT  --  32  --   --   HEPARINUNFRC  --  <0.10*  --   --   CREATININE 3.89* 3.15* 2.65*  --     Estimated Creatinine Clearance: 15.9 mL/min (A) (by C-G formula based on SCr of 2.65 mg/dL (H)).   Medical History: Past Medical History:  Diagnosis Date  . ALLERGIC RHINITIS   . ANEMIA-NOS   . Arthritis   . ASTHMA   . Carpal tunnel syndrome   . COLONIC POLYPS, HX OF   . Coronary artery disease    "mild CAD" noted on 12/05/17 in coronary CT scan  . DIABETES MELLITUS, TYPE II    diet controlled  . GERD   . HYPERLIPIDEMIA   . HYPERTENSION   . OSTEOPENIA   . PONV (postoperative nausea and vomiting)   . Psoriasis    severe, began soriatane 01/2012  . Rectal fissure   . Scoliosis    Assessment: On Xarelto PTA for hx PE, holding Xarelto and starting heparin during acute renal failure. Baseline CBC is good.   Was transitioned to Eliquis on 7/7 for prophylaxis for history of PE. Hgb 11, plt 221. Underwent colonscopy today- biopsied mucosa vascular pattern in colon. Okay per GI to resume apixaban.   Goal of Therapy:  Monitor platelets by  anticoagulation protocol: Yes   Plan:  Eliquis 2.5 mg po BID  Antonietta Jewel, PharmD, BCCCP Clinical Pharmacist  Pager: [phone] Phone: [phone]

## 2018-08-22 NOTE — Anesthesia Preprocedure Evaluation (Addendum)
Anesthesia Evaluation  Patient identified by MRN, date of birth, ID band Patient awake    Reviewed: Allergy & Precautions, NPO status , Patient's Chart, lab work & pertinent test results  History of Anesthesia Complications (+) PONV and history of anesthetic complications  Airway Mallampati: II  TM Distance: >3 FB Neck ROM: Full    Dental  (+) Dental Advisory Given   Pulmonary former smoker, PE  Denies asthma    breath sounds clear to auscultation       Cardiovascular hypertension, Pt. on medications and Pt. on home beta blockers (-) angina+ CAD   Rhythm:Regular Rate:Normal     Neuro/Psych negative neurological ROS  negative psych ROS   GI/Hepatic Neg liver ROS, GERD  Medicated and Controlled,  Endo/Other  diabetes, Well Controlled, Type 2  Renal/GU CRFRenal disease Hypokalemia Hyperchloremia      Musculoskeletal  (+) Arthritis ,  Scoliosis    Abdominal   Peds  Hematology negative hematology ROS (+)   Anesthesia Other Findings   Reproductive/Obstetrics                            Anesthesia Physical Anesthesia Plan  ASA: III  Anesthesia Plan: MAC   Post-op Pain Management:    Induction: Intravenous  PONV Risk Score and Plan: 3 and Propofol infusion and Treatment may vary due to age or medical condition  Airway Management Planned: Nasal Cannula and Natural Airway  Additional Equipment: None  Intra-op Plan:   Post-operative Plan:   Informed Consent: I have reviewed the patients History and Physical, chart, labs and discussed the procedure including the risks, benefits and alternatives for the proposed anesthesia with the patient or authorized representative who has indicated his/her understanding and acceptance.       Plan Discussed with: CRNA and Anesthesiologist  Anesthesia Plan Comments:        Anesthesia Quick Evaluation

## 2018-08-22 NOTE — Op Note (Addendum)
[facility] Patient Name: [name] Procedure Date : 08/22/2018 MRN: [id number] Attending MD: Juanita Craver , MD Date of Birth: [birth year] CSN: [id number] Age: 73 Admit Type: Inpatient Procedure:                Colonoscopy with random biopsies. Indications:              Change in bowel habits, Abnormal weight loss, CRC                            screening for colorectal malignant neoplasm Providers:                Juanita Craver, MD, Cleda Daub, RN, Ladona Ridgel,                            Technician, Trixie Deis, CRNA Referring MD:             [name], MD Medicines:                Monitored Anesthesia Care. Complications:            No immediate complications. Estimated Blood Loss:     Estimated blood loss was minimal. Procedure:                Pre-Anesthesia Assessment: - Prior to the                            procedure, a history and physical was performed,                            and patient medications and allergies were                            reviewed. The patient's tolerance of previous                            anesthesia was also reviewed. The risks and                            benefits of the procedure and the sedation options                            and risks were discussed with the patient. All                            questions were answered, and informed consent was                            obtained. Prior Anticoagulants: The patient has                            taken Eliquis (apixaban), last dose was 1 day prior                            to procedure. ASA Grade Assessment: III - A patient  with severe systemic disease that is a constant                            threat to life. After reviewing the risks and                            benefits, the patient was deemed in satisfactory                            condition to undergo the procedure. After obtaining                            informed  consent, the colonoscope was passed under                            direct vision. Throughout the procedure, the                            patient's blood pressure, pulse, and oxygen                            saturations were monitored continuously. The                            [id number]-[id number] ([id number] Olympus pediatric colonscope                            was introduced through the anus and advanced to the                            the terminal ileum, with identification of the                            appendiceal orifice and IC valve. The colonoscopy                            was performed with moderate difficulty due to                            inadequate bowel prep. Successful completion of the                            procedure was aided by lavage. The patient                            tolerated the procedure well. The quality of the                            bowel preparation was poor. Scope In: 3:41:36 PM Scope Out: 3:52:34 PM Scope Withdrawal Time: 0 hours 6 minutes 27 seconds  Total Procedure Duration: 0 hours 10 minutes 58 seconds  Findings:      There was patchy loss of vascular markings noted throughout the       colon-random biopsies  were done to rule out microscopic colitis.      The terminal ileum appeared normal.      A few small-mouthed diverticula were found in the sigmoid colon.      No additional abnormalities were found on retroflexion. Impression:               - Preparation of the colon was poor-aggressive                            lavage was done.                           - Decreased mucosa vascular pattern in the entire                            examined colon. Biopsied.                           - The examined portion of the ileum was normal. Recommendation:           - High fiber diet with augmented water consumption                            daily.                           - Continue present medications; resume Eliquis                             today.                           - Await pathology results.                           - Repeat colonoscopy is not recommended due to                            patient's age.                           - Return to GI office in 1 week after discharge. Procedure Code(s):        --- Professional ---                           [phone], Colonoscopy, flexible; with biopsy, single                            or multiple Diagnosis Code(s):        --- Professional ---                           R19.4, Change in bowel habit                           R63.4, Abnormal weight loss  D50.9, Iron deficiency anemia, unspecified                           Z12.11, Encounter for screening for malignant                            neoplasm of colon CPT copyright 2019 [employer]. All rights reserved. The codes documented in this report are preliminary and upon coder review may  be revised to meet current compliance requirements. Juanita Craver, MD Juanita Craver, MD 08/22/2018 4:07:07 PM This report has been signed electronically. Number of Addenda: 0

## 2018-08-22 NOTE — Anesthesia Postprocedure Evaluation (Signed)
Anesthesia Post Note  Patient: [name]  Procedure(s) Performed: COLONOSCOPY (N/A ) BIOPSY     Patient location during evaluation: PACU Anesthesia Type: MAC Level of consciousness: awake and alert Pain management: pain level controlled Vital Signs Assessment: post-procedure vital signs reviewed and stable Respiratory status: spontaneous breathing, nonlabored ventilation and respiratory function stable Cardiovascular status: stable and blood pressure returned to baseline Anesthetic complications: no    Last Vitals:  Vitals:   08/22/18 1620 08/22/18 1637  BP: (!) 125/50 (!) 145/80  Pulse: 74 70  Resp: 16 18  Temp:  36.9 C  SpO2: 100% 100%    Last Pain:  Vitals:   08/22/18 1637  TempSrc: Oral  PainSc:                  Audry Pili

## 2018-08-22 NOTE — Progress Notes (Signed)
PROGRESS NOTE    [name]  QPY:[id number] DOB: August 25, 1945 DOA: 08/20/2018 PCP: [name], MD    Brief Narrative:  73 year old female who presented with nausea, vomiting and diarrhea.  He does have significant past medical history for type 2 diabetes mellitus, hypertension and dyslipidemia.  She reported symptoms persistent for the last month, hospitalization June 17 to June 26 for similar symptoms.  She had extensive gastrointestinal work-up with no conclusive diagnosis.  Initial physical examination blood pressure 122/89, heart rate 86, respirate 19, oxygen saturation 100%, moist mucous membranes, lungs clear to auscultation bilaterally, heart S1-S2 present and rhythmic, abdomen soft nontender, no lower extremity edema sodium 142, potassium 4.4, chloride 118, bicarb 11, glucose 95, BUN 53, creatinine 3.89, white count 7.0, hemoglobin 12.7, hematocrit 40.8, platelets 286, SARS COVID-19 was negative, urinalysis more than 50 white cells, 11-20 red cells, specific gravity 1.017, 100 protein.  CT of the abdomen of the abdomen with fluid-filled bowel, no acute changes.  Patient was admitted to the hospital working diagnosis of acute kidney injury due to acute diarrhea.   Assessment & Plan:   Principal Problem:   Acute kidney failure (HCC) Active Problems:   Diabetes type 2, controlled ([place])   Pulmonary embolism (HCC)   Nausea vomiting and diarrhea   Acute lower UTI   Dehydration   Diarrhea in adult patient  1 hypovolemic acute kidney injury with non-anion gap metabolic acidosis Likely secondary to prerenal azotemia secondary to severe diarrhea that patient had presented with decreased oral intake.  Creatinine on admission was 3.89 from 0.82 on 08/10/2018.  Urinalysis with large leukocytes nitrite negative greater than 50 WBCs.  100 protein noted in urine.  CT abdomen and pelvis with no hydronephrosis or perinephric edema.  Unchanged cystic lesion in the right mid kidney from recent  exams.  Urinary bladder is nondistended.  No bladder wall thickening noted.  Overly recorded.  Renal function trending down with hydration.  Bicarb at 13.  Anion gap at 10.  Change IV fluids to bicarb drip and follow renal function.  Avoid nephrotoxins.  Follow.  2.  Hypokalemia Check a magnesium level.  Replete.  3.  Urinary tract infection Urine cultures pending.  Continue IV Rocephin.  4.  Acute on chronic diarrhea Questionable etiology.  Renal panel pending.  GI work-up during last hospitalization was unremarkable with GI infectious panel including C. difficile been negative.  Patient still with loose stools.  GI consulted patient seen in consultation by Dr. Collene Mares, who was recommending an unprepped colonoscopy to be done today to obtain mucosal so biopsies to rule out microscopic colitis.  Per GI patient also with fatty atrophic pancreas, exocrine pancreatic insufficiency which may be contributing to patient's diarrhea.  Patient started on pancreatic supplementation.  Will discontinue Eliquis which was started yesterday in anticipation of procedure to be done today.  Will make n.p.o. this morning.  Continue IV fluids, supportive care.  Per GI.  5.  Pulmonary embolism Patient noted to be on Eliquis at home prior to admission.  CT chest from November 2019 with nonocclusive filling defect in the distal left main pulmonary artery.  Will discontinue anticoagulation today in anticipation of unprepped colonoscopy.  GI.  GI to advise when anticoagulation may be resumed.  Follow.  6.  Type 2 diabetes Hemoglobin A1c was 6.4 on 04/30/2018.  CBG of 97 this morning.  Continue sliding scale insulin.   DVT prophylaxis: SCDs Code Status: Full Family Communication: Updated patient.  No family at bedside.  Disposition Plan: Home when diarrhea improved and when okay with GI.   Consultants:   Gastroenterology: Dr. Collene Mares 08/21/2018  Procedures:   CT abdomen and pelvis 08/21/2018  Antimicrobials:  IV  Rocephin 08/21/2018   Subjective: Patient sitting up on the side of the bed stating she does not feel well however cannot expand on it.  Patient denies any chest pain.  No shortness of breath.  Patient still with 2 stools per RN this morning.  No nausea or emesis.  No chest pain.  No shortness of breath.  Objective: Vitals:   08/21/18 1654 08/21/18 2021 08/22/18 0427 08/22/18 0906  BP: 140/73 133/68 131/65 (!) 141/66  Pulse: 79 76 71 72  Resp: 18 18 18 18   Temp: 98.7 F (37.1 C) 97.8 F (36.6 C) 97.8 F (36.6 C) 97.8 F (36.6 C)  TempSrc: Oral   Oral  SpO2: 98% 97% 97% 100%  Weight:      Height:        Intake/Output Summary (Last 24 hours) at 08/22/2018 1411 Last data filed at 08/22/2018 1100 Gross per 24 hour  Intake 1559.04 ml  Output 300 ml  Net 1259.04 ml   Filed Weights   08/20/18 1912 08/21/18 0109  Weight: 63.5 kg 63.5 kg    Examination:  General exam: Appears calm and comfortable  Respiratory system: Clear to auscultation. Respiratory effort normal. Cardiovascular system: S1 & S2 heard, RRR. No JVD, murmurs, rubs, gallops or clicks. No pedal edema. Gastrointestinal system: Abdomen is nondistended, soft and nontender. No organomegaly or masses felt. Normal bowel sounds heard. Central nervous system: Alert and oriented. No focal neurological deficits. Extremities: Symmetric 5 x 5 power. Skin: No rashes, lesions or ulcers Psychiatry: Judgement and insight appear normal. Mood & affect appropriate.     Data Reviewed: I have personally reviewed following labs and imaging studies  CBC: Recent Labs  Lab 08/20/18 1939 08/22/18 1117  WBC 7.0 7.5  NEUTROABS  --  5.1  HGB 12.7 11.0*  HCT 40.8 33.8*  MCV 101.0* 97.1  PLT 286 086   Basic Metabolic Panel: Recent Labs  Lab 08/20/18 1939 08/21/18 0501 08/22/18 0520 08/22/18 1117  NA 142 147* 143  --   K 4.4 4.0 3.2*  --   CL 118* 123* 120*  --   CO2 11* 13* 13*  --   GLUCOSE 95 91 90  --   BUN 53* 52* 39*   --   CREATININE 3.89* 3.15* 2.65*  --   CALCIUM 9.5 8.8* 8.7*  --   MG  --   --   --  1.4*   GFR: Estimated Creatinine Clearance: 15.9 mL/min (A) (by C-G formula based on SCr of 2.65 mg/dL (H)). Liver Function Tests: Recent Labs  Lab 08/20/18 1939  AST 24  ALT 30  ALKPHOS 69  BILITOT 1.5*  PROT 7.2  ALBUMIN 3.7   Recent Labs  Lab 08/20/18 1939  LIPASE 108*   No results for input(s): AMMONIA in the last 168 hours. Coagulation Profile: No results for input(s): INR, PROTIME in the last 168 hours. Cardiac Enzymes: No results for input(s): CKTOTAL, CKMB, CKMBINDEX, TROPONINI in the last 168 hours. BNP (last 3 results) No results for input(s): PROBNP in the last 8760 hours. HbA1C: No results for input(s): HGBA1C in the last 72 hours. CBG: Recent Labs  Lab 08/21/18 2020 08/22/18 0017 08/22/18 0426 08/22/18 0757 08/22/18 1132  GLUCAP 94 88 76 97 81   Lipid Profile: No results for input(s):  CHOL, HDL, LDLCALC, TRIG, CHOLHDL, LDLDIRECT in the last 72 hours. Thyroid Function Tests: No results for input(s): TSH, T4TOTAL, FREET4, T3FREE, THYROIDAB in the last 72 hours. Anemia Panel: No results for input(s): VITAMINB12, FOLATE, FERRITIN, TIBC, IRON, RETICCTPCT in the last 72 hours. Sepsis Labs: No results for input(s): PROCALCITON, LATICACIDVEN in the last 168 hours.  Recent Results (from the past 240 hour(s))  SARS Coronavirus 2 (CEPHEID - Performed in [place] hospital lab), Hosp Order     Status: None   Collection Time: 08/21/18  1:07 AM   Specimen: Nasopharyngeal Swab  Result Value Ref Range Status   SARS Coronavirus 2 NEGATIVE NEGATIVE Final    Comment: (NOTE) If result is NEGATIVE SARS-CoV-2 target nucleic acids are NOT DETECTED. The SARS-CoV-2 RNA is generally detectable in upper and lower  respiratory specimens during the acute phase of infection. The lowest  concentration of SARS-CoV-2 viral copies this assay can detect is 250  copies / mL. A negative  result does not preclude SARS-CoV-2 infection  and should not be used as the sole basis for treatment or other  patient management decisions.  A negative result may occur with  improper specimen collection / handling, submission of specimen other  than nasopharyngeal swab, presence of viral mutation(s) within the  areas targeted by this assay, and inadequate number of viral copies  (<250 copies / mL). A negative result must be combined with clinical  observations, patient history, and epidemiological information. If result is POSITIVE SARS-CoV-2 target nucleic acids are DETECTED. The SARS-CoV-2 RNA is generally detectable in upper and lower  respiratory specimens dur ing the acute phase of infection.  Positive  results are indicative of active infection with SARS-CoV-2.  Clinical  correlation with patient history and other diagnostic information is  necessary to determine patient infection status.  Positive results do  not rule out bacterial infection or co-infection with other viruses. If result is PRESUMPTIVE POSTIVE SARS-CoV-2 nucleic acids MAY BE PRESENT.   A presumptive positive result was obtained on the submitted specimen  and confirmed on repeat testing.  While 2019 novel coronavirus  (SARS-CoV-2) nucleic acids may be present in the submitted sample  additional confirmatory testing may be necessary for epidemiological  and / or clinical management purposes  to differentiate between  SARS-CoV-2 and other Sarbecovirus currently known to infect humans.  If clinically indicated additional testing with an alternate test  methodology [phone]) is advised. The SARS-CoV-2 RNA is generally  detectable in upper and lower respiratory sp ecimens during the acute  phase of infection. The expected result is Negative. Fact Sheet for Patients:  StrictlyIdeas.no Fact Sheet for Healthcare Providers: BankingDealers.co.za This test is not yet  approved or cleared by the [place] FDA and has been authorized for detection and/or diagnosis of SARS-CoV-2 by FDA under an Emergency Use Authorization (EUA).  This EUA will remain in effect (meaning this test can be used) for the duration of the COVID-19 declaration under Section 564(b)(1) of the Act, 21 U.S.C. section 360bbb-3(b)(1), unless the authorization is terminated or revoked sooner. Performed at [facility], [place] [address] [place], [place] [address]          Radiology Studies: Ct Abdomen Pelvis Wo Contrast  Result Date: 08/21/2018 CLINICAL DATA:  Persistent nausea, vomiting, diarrhea. Recent hospitalization for seen. EXAM: CT ABDOMEN AND PELVIS WITHOUT CONTRAST TECHNIQUE: Multidetector CT imaging of the abdomen and pelvis was performed following the standard protocol without IV contrast. COMPARISON:  CT 08/01/2018.  CT  07/27/2018 FINDINGS: Lower chest: Chronic scattered bilateral pulmonary opacities which are not significantly changed from prior exam, recently evaluated with chest CT. No acute airspace disease or pleural fluid. Hepatobiliary: No focal hepatic abnormality. Persistent high-density material in the gallbladder. No pericholecystic inflammation. No biliary dilatation. Pancreas: Fatty atrophy.  No ductal dilatation or inflammation. Spleen: Normal in size without focal abnormality. Adrenals/Urinary Tract: Normal adrenal glands. No hydronephrosis or perinephric edema. Unchanged cystic lesion in the right mid kidney from recent exams. Urinary bladder is nondistended. No bladder wall thickening. Stomach/Bowel: Small hiatal hernia. Stomach is nondistended. No bowel obstruction or inflammation. Similar fluid-filled small bowel from prior exam. Liquid stool in the proximal colon, fluid within the rectum. Minimal colonic diverticulosis. No diverticulitis. Colon without colonic wall thickening or inflammation. Vascular/Lymphatic: Small central mesenteric nodes, unchanged  from prior exam. No enlarged lymph nodes in the abdomen or pelvis. Aortic atherosclerosis and tortuosity. No aneurysm. Reproductive: Unenhanced uterus and bilateral adnexa are unremarkable. Hypodensity in the uterus on contrast-enhanced exam not appreciated. Other: No free air, free fluid, or intra-abdominal fluid collection. Musculoskeletal: Scoliosis and degenerative change. There are no acute or suspicious osseous abnormalities. IMPRESSION: 1. Similar fluid-filled bowel to prior exam, can be seen with unspecified enteritis. Liquid stool in portions of the colon consistent with diarrhea. No bowel inflammation. 2. High-density material in the gallbladder, favor sludge. 3. No other acute findings. Multiple chronic findings are stable from recent exams. 4.  Aortic Atherosclerosis (ICD10-I70.0). Electronically Signed   By: Keith Rake M.D.   On: 08/21/2018 00:31        Scheduled Meds: . insulin aspart  0-9 Units Subcutaneous Q4H  . lipase/protease/amylase  36,000 Units Oral TID WC  . multivitamin with minerals  1 tablet Oral Daily  . pantoprazole  40 mg Oral Daily   Continuous Infusions: . cefTRIAXone (ROCEPHIN)  IV 1 g (08/21/18 2149)  .  sodium bicarbonate  infusion 1000 mL 100 mL/hr at 08/22/18 1217     LOS: 1 day    Time spent: 35 minutes    Irine Seal, MD [facility]  If 7PM-7AM, please contact night-coverage [email or web address] 08/22/2018, 2:11 PM

## 2018-08-22 NOTE — Transfer of Care (Signed)
Immediate Anesthesia Transfer of Care Note  Patient: [name]  Procedure(s) Performed: COLONOSCOPY (N/A ) BIOPSY  Patient Location: Endoscopy Unit  Anesthesia Type:MAC  Level of Consciousness: sedated  Airway & Oxygen Therapy: Patient Spontanous Breathing and Patient connected to face mask oxygen  Post-op Assessment: Report given to RN and Post -op Vital signs reviewed and stable  Post vital signs: Reviewed and stable  Last Vitals:  Vitals Value Taken Time  BP    Temp    Pulse 69 08/22/18 1602  Resp 18 08/22/18 1602  SpO2 99 % 08/22/18 1602  Vitals shown include unvalidated device data.  Last Pain:  Vitals:   08/22/18 1431  TempSrc: Oral  PainSc: 0-No pain         Complications: No apparent anesthesia complications

## 2018-08-22 NOTE — Progress Notes (Signed)
Received message from Cassie at [facility]. Patient is active for [facility] PT. Will need HH orders for PT at discharge.   Manya Silvas, RN CM Transition of Care [phone]

## 2018-08-22 NOTE — Progress Notes (Signed)
Subjective: Patient is in endoscopy for a unprepped colonoscopy. She has been off the Heparin since yesterday,   Objective: Vital signs in last 24 hours: Temp:  [97.8 F (36.6 C)-98.8 F (37.1 C)] 98.8 F (37.1 C) (07/08 1431) Pulse Rate:  [71-79] 77 (07/08 1431) Resp:  [11-18] 11 (07/08 1431) BP: (131-163)/(65-78) 163/78 (07/08 1431) SpO2:  [97 %-100 %] 99 % (07/08 1431) Last BM Date: 08/22/18  Intake/Output from previous day: 07/07 0701 - 07/08 0700 In: 1659 [P.O.:340; I.V.:1218; IV Piggyback:101] Out: 0  Intake/Output this shift: Total I/O In: 120 [P.O.:120] Out: 300 [Stool:300]  General appearance: cooperative, appears stated age, fatigued, no distress and pale Resp: clear to auscultation bilaterally Cardio: regular rate and rhythm, S1, S2 normal, no murmur, click, rub or gallop GI: soft, non-tender; bowel sounds normal; no masses,  no organomegaly Extremities: extremities normal, atraumatic, no cyanosis or edema  Lab Results: Recent Labs    08/20/18 1939 08/22/18 1117  WBC 7.0 7.5  HGB 12.7 11.0*  HCT 40.8 33.8*  PLT 286 221   BMET Recent Labs    08/20/18 1939 08/21/18 0501 08/22/18 0520  NA 142 147* 143  K 4.4 4.0 3.2*  CL 118* 123* 120*  CO2 11* 13* 13*  GLUCOSE 95 91 90  BUN 53* 52* 39*  CREATININE 3.89* 3.15* 2.65*  CALCIUM 9.5 8.8* 8.7*   LFT Recent Labs    08/20/18 1939  PROT 7.2  ALBUMIN 3.7  AST 24  ALT 30  ALKPHOS 69  BILITOT 1.5*   Studies/Results: Ct Abdomen Pelvis Wo Contrast  Result Date: 08/21/2018 CLINICAL DATA:  Persistent nausea, vomiting, diarrhea. Recent hospitalization for seen. EXAM: CT ABDOMEN AND PELVIS WITHOUT CONTRAST TECHNIQUE: Multidetector CT imaging of the abdomen and pelvis was performed following the standard protocol without IV contrast. COMPARISON:  CT 08/01/2018.  CT 07/27/2018 FINDINGS: Lower chest: Chronic scattered bilateral pulmonary opacities which are not significantly changed from prior exam, recently  evaluated with chest CT. No acute airspace disease or pleural fluid. Hepatobiliary: No focal hepatic abnormality. Persistent high-density material in the gallbladder. No pericholecystic inflammation. No biliary dilatation. Pancreas: Fatty atrophy.  No ductal dilatation or inflammation. Spleen: Normal in size without focal abnormality. Adrenals/Urinary Tract: Normal adrenal glands. No hydronephrosis or perinephric edema. Unchanged cystic lesion in the right mid kidney from recent exams. Urinary bladder is nondistended. No bladder wall thickening. Stomach/Bowel: Small hiatal hernia. Stomach is nondistended. No bowel obstruction or inflammation. Similar fluid-filled small bowel from prior exam. Liquid stool in the proximal colon, fluid within the rectum. Minimal colonic diverticulosis. No diverticulitis. Colon without colonic wall thickening or inflammation. Vascular/Lymphatic: Small central mesenteric nodes, unchanged from prior exam. No enlarged lymph nodes in the abdomen or pelvis. Aortic atherosclerosis and tortuosity. No aneurysm. Reproductive: Unenhanced uterus and bilateral adnexa are unremarkable. Hypodensity in the uterus on contrast-enhanced exam not appreciated. Other: No free air, free fluid, or intra-abdominal fluid collection. Musculoskeletal: Scoliosis and degenerative change. There are no acute or suspicious osseous abnormalities. IMPRESSION: 1. Similar fluid-filled bowel to prior exam, can be seen with unspecified enteritis. Liquid stool in portions of the colon consistent with diarrhea. No bowel inflammation. 2. High-density material in the gallbladder, favor sludge. 3. No other acute findings. Multiple chronic findings are stable from recent exams. 4.  Aortic Atherosclerosis (ICD10-I70.0). Electronically Signed   By: Keith Rake M.D.   On: 08/21/2018 00:31   Medications: I have reviewed the patient's current medications.  Assessment/Plan: 1) Change in bowel habits/abnormal weight  loss-proceed  with a colonoscopy at this time.   LOS: 1 day   Juanita Craver 08/22/2018, 3:13 PM

## 2018-08-23 LAB — GLUCOSE, CAPILLARY
Glucose-Capillary: 113 mg/dL — ABNORMAL HIGH (ref 70–99)
Glucose-Capillary: 118 mg/dL — ABNORMAL HIGH (ref 70–99)
Glucose-Capillary: 124 mg/dL — ABNORMAL HIGH (ref 70–99)
Glucose-Capillary: 127 mg/dL — ABNORMAL HIGH (ref 70–99)
Glucose-Capillary: 141 mg/dL — ABNORMAL HIGH (ref 70–99)
Glucose-Capillary: 148 mg/dL — ABNORMAL HIGH (ref 70–99)
Glucose-Capillary: 166 mg/dL — ABNORMAL HIGH (ref 70–99)

## 2018-08-23 LAB — GASTROINTESTINAL PANEL BY PCR, STOOL (REPLACES STOOL CULTURE)

## 2018-08-23 LAB — BASIC METABOLIC PANEL
Anion gap: 11 (ref 5–15)
Anion gap: 9 (ref 5–15)
BUN: 22 mg/dL (ref 8–23)
BUN: 20 mg/dL (ref 8–23)
CO2: 20 mmol/L — ABNORMAL LOW (ref 22–32)
CO2: 24 mmol/L (ref 22–32)
Calcium: 8.4 mg/dL — ABNORMAL LOW (ref 8.9–10.3)
Calcium: 8.4 mg/dL — ABNORMAL LOW (ref 8.9–10.3)
Chloride: 111 mmol/L (ref 98–111)
Chloride: 111 mmol/L (ref 98–111)
Creatinine, Ser: 1.75 mg/dL — ABNORMAL HIGH (ref 0.44–1.00)
Creatinine, Ser: 1.67 mg/dL — ABNORMAL HIGH (ref 0.44–1.00)
GFR calc Af Amer: 33 mL/min — ABNORMAL LOW (ref >60)
GFR calc Af Amer: 35 mL/min — ABNORMAL LOW (ref >60)
GFR calc non Af Amer: 28 mL/min — ABNORMAL LOW (ref >60)
GFR calc non Af Amer: 30 mL/min — ABNORMAL LOW (ref >60)
Glucose, Bld: 140 mg/dL — ABNORMAL HIGH (ref 70–99)
Glucose, Bld: 143 mg/dL — ABNORMAL HIGH (ref 70–99)
Potassium: 2.5 mmol/L — CL (ref 3.5–5.1)
Potassium: 4.7 mmol/L (ref 3.5–5.1)
Sodium: 142 mmol/L (ref 135–145)
Sodium: 144 mmol/L (ref 135–145)

## 2018-08-23 LAB — CBC WITH DIFFERENTIAL/PLATELET
Abs Immature Granulocytes: 0.04 10*3/uL (ref 0.00–0.07)
Basophils Absolute: 0.1 10*3/uL (ref 0.0–0.1)
Basophils Relative: 1 %
Eosinophils Absolute: 0.4 10*3/uL (ref 0.0–0.5)
Eosinophils Relative: 6 %
HCT: 30.6 % — ABNORMAL LOW (ref 36.0–46.0)
Hemoglobin: 10.4 g/dL — ABNORMAL LOW (ref 12.0–15.0)
Immature Granulocytes: 1 %
Lymphocytes Relative: 19 %
Lymphs Abs: 1.5 10*3/uL (ref 0.7–4.0)
MCH: 31.1 pg (ref 26.0–34.0)
MCHC: 34 g/dL (ref 30.0–36.0)
MCV: 91.6 fL (ref 80.0–100.0)
Monocytes Absolute: 0.7 10*3/uL (ref 0.1–1.0)
Monocytes Relative: 9 %
Neutro Abs: 5.1 10*3/uL (ref 1.7–7.7)
Neutrophils Relative %: 64 %
Platelets: 199 10*3/uL (ref 150–400)
RBC: 3.34 MIL/uL — ABNORMAL LOW (ref 3.87–5.11)
RDW: 14.1 % (ref 11.5–15.5)
WBC: 7.9 10*3/uL (ref 4.0–10.5)
nRBC: 0 % (ref 0.0–0.2)

## 2018-08-23 LAB — URINE CULTURE: Culture: NO GROWTH

## 2018-08-23 LAB — MAGNESIUM: Magnesium: 2.5 mg/dL — ABNORMAL HIGH (ref 1.7–2.4)

## 2018-08-23 MED ORDER — POTASSIUM CHLORIDE CRYS ER 20 MEQ PO TBCR
40.0000 meq | EXTENDED_RELEASE_TABLET | Freq: Four times a day (QID) | ORAL | Status: DC
  Administered 2018-08-23: 40 meq via ORAL
  Filled 2018-08-23: qty 2

## 2018-08-23 MED ORDER — HYDRALAZINE HCL 20 MG/ML IJ SOLN: 5.0000 mg | Freq: Four times a day (QID) | INTRAMUSCULAR | Status: DC | PRN

## 2018-08-23 MED ORDER — POTASSIUM CHLORIDE 20 MEQ PO PACK
40.0000 meq | PACK | Freq: Once | ORAL | Status: AC
  Administered 2018-08-23: 40 meq via ORAL
  Filled 2018-08-23: qty 2

## 2018-08-23 MED ORDER — PRAVASTATIN SODIUM 10 MG PO TABS
20.0000 mg | ORAL_TABLET | Freq: Every day | ORAL | Status: DC
  Administered 2018-08-23 – 2018-08-28 (×5): 20 mg via ORAL
  Filled 2018-08-23 (×6): qty 2

## 2018-08-23 MED ORDER — NEBIVOLOL HCL 5 MG PO TABS
5.0000 mg | ORAL_TABLET | Freq: Every day | ORAL | Status: DC
  Administered 2018-08-23 – 2018-08-29 (×7): 5 mg via ORAL
  Filled 2018-08-23 (×7): qty 1

## 2018-08-23 NOTE — Progress Notes (Signed)
PROGRESS NOTE    [name]  NIO:[id number] DOB: March 19, 1945 DOA: 08/20/2018 PCP: [name], MD    Brief Narrative:  73 year old female who presented with nausea, vomiting and diarrhea.  He does have significant past medical history for type 2 diabetes mellitus, hypertension and dyslipidemia.  She reported symptoms persistent for the last month, hospitalization June 17 to June 26 for similar symptoms.  She had extensive gastrointestinal work-up with no conclusive diagnosis.  Initial physical examination blood pressure 122/89, heart rate 86, respirate 19, oxygen saturation 100%, moist mucous membranes, lungs clear to auscultation bilaterally, heart S1-S2 present and rhythmic, abdomen soft nontender, no lower extremity edema sodium 142, potassium 4.4, chloride 118, bicarb 11, glucose 95, BUN 53, creatinine 3.89, white count 7.0, hemoglobin 12.7, hematocrit 40.8, platelets 286, SARS COVID-19 was negative, urinalysis more than 50 white cells, 11-20 red cells, specific gravity 1.017, 100 protein.  CT of the abdomen of the abdomen with fluid-filled bowel, no acute changes.  Patient was admitted to the hospital working diagnosis of acute kidney injury due to acute diarrhea.   Assessment & Plan:   Principal Problem:   Acute kidney failure (HCC) Active Problems:   Diabetes type 2, controlled ([place])   Pulmonary embolism (HCC)   Nausea vomiting and diarrhea   Acute lower UTI   Dehydration   Diarrhea in adult patient  1 hypovolemic acute kidney injury with non-anion gap metabolic acidosis Likely secondary to prerenal azotemia secondary to severe diarrhea that patient had presented with decreased oral intake.  Creatinine on admission was 3.89 from 0.82 on 08/10/2018.  Urinalysis with large leukocytes nitrite negative greater than 50 WBCs.  100 protein noted in urine.  CT abdomen and pelvis with no hydronephrosis or perinephric edema.  Unchanged cystic lesion in the right mid kidney from recent  exams.  Urinary bladder is nondistended.  No bladder wall thickening noted.  Urine output recorded of 1 L over the past 24 hours however not accurate.  Acidosis improving with bicarb drip.  Renal function slowly trending down creatinine currently at 1.75.  Avoid nephrotoxins.  Continue bicarb drip.  Follow.    2.  Hypokalemia/hypomagnesemia. Likely secondary to GI losses.  Potassium currently at 2.5.  K. Dur 40 mEq every 4 hours x2 doses.  Magnesium repleted currently at 2.5 from 1.4.  Repeat BMET this afternoon.    3.  Urinary tract infection Urine cultures pending.  Continue IV Rocephin.  4.  Acute on chronic diarrhea Questionable etiology.  GI pathogen panel negative. GI work-up during last hospitalization was unremarkable with GI infectious panel including C. difficile been negative.  Patient still with loose stools.  GI consulted patient seen in consultation by Dr. Collene Mares, who was recommending an unprepped colonoscopy to be done today to obtain mucosal so biopsies to rule out microscopic colitis.  Per GI patient also with fatty atrophic pancreas, exocrine pancreatic insufficiency which may be contributing to patient's diarrhea.  Patient started on pancreatic supplementation.  Patient underwent unprepped colonoscopy on 08/22/2018 per Dr. Collene Mares with biopsies taken to rule out microscopic colitis.  Patient still with loose stools however volume decreasing per RN.  Continue Lomotil as needed.  Continue IV fluids, supportive care.  Per GI.   5.  Pulmonary embolism Patient noted to be on Eliquis at home prior to admission.  CT chest from November 2019 with nonocclusive filling defect in the distal left main pulmonary artery.  Eliquis resumed after being held for unprepped colonoscopy on 08/22/2018.  No signs  of bleeding.  Follow.   6.  Type 2 diabetes Hemoglobin A1c was 6.4 on 04/30/2018.  CBG of 141 this morning.  Continue sliding scale insulin.   DVT prophylaxis: Eliquis. Code Status: Full Family  Communication: Updated patient.  No family at bedside. Disposition Plan: Home when diarrhea improved and when okay with GI.   Consultants:   Gastroenterology: Dr. Collene Mares 08/21/2018  Procedures:   CT abdomen and pelvis 08/21/2018  Unprepped colonoscopy per Dr. Collene Mares 08/22/2018  Antimicrobials:  IV Rocephin 08/21/2018   Subjective: Patient states not feeling too well.  Patient still noted to have multiple loose stools.  No chest pain.  No shortness of breath.  No nausea or emesis.  No bleeding noted.   Objective: Vitals:   08/22/18 1637 08/22/18 2035 08/23/18 0437 08/23/18 0950  BP: (!) 145/80 (!) 148/80 (!) 152/76 (!) 157/76  Pulse: 70 74 67 65  Resp: 18 18 18 18   Temp: 98.4 F (36.9 C) 98.4 F (36.9 C) 97.8 F (36.6 C) 98.3 F (36.8 C)  TempSrc: Oral Oral  Oral  SpO2: 100% 97% 98% 97%  Weight:      Height:        Intake/Output Summary (Last 24 hours) at 08/23/2018 1139 Last data filed at 08/23/2018 0700 Gross per 24 hour  Intake 1978.74 ml  Output 1301 ml  Net 677.74 ml   Filed Weights   08/20/18 1912 08/21/18 0109  Weight: 63.5 kg 63.5 kg    Examination:  General exam: NAD. Respiratory system: CTAB. No wheezes, no crackles, no rhonchi.  Normal respiratory effort.   Cardiovascular system: Regular rate rhythm no murmurs rubs or gallops.  No JVD.  No lower extremity edema.  Gastrointestinal system: Abdomen is soft, nontender, nondistended, positive bowel sounds.  No rebound.  No guarding.  Central nervous system: Alert and oriented. No focal neurological deficits. Extremities: Symmetric 5 x 5 power. Skin: No rashes, lesions or ulcers Psychiatry: Judgement and insight appear normal. Mood & affect appropriate.     Data Reviewed: I have personally reviewed following labs and imaging studies  CBC: Recent Labs  Lab 08/20/18 1939 08/22/18 1117 08/23/18 0324  WBC 7.0 7.5 7.9  NEUTROABS  --  5.1 5.1  HGB 12.7 11.0* 10.4*  HCT 40.8 33.8* 30.6*  MCV 101.0* 97.1 91.6   PLT 286 221 829   Basic Metabolic Panel: Recent Labs  Lab 08/20/18 1939 08/21/18 0501 08/22/18 0520 08/22/18 1117 08/23/18 0324  NA 142 147* 143  --  142  K 4.4 4.0 3.2*  --  2.5*  CL 118* 123* 120*  --  111  CO2 11* 13* 13*  --  20*  GLUCOSE 95 91 90  --  140*  BUN 53* 52* 39*  --  22  CREATININE 3.89* 3.15* 2.65*  --  1.75*  CALCIUM 9.5 8.8* 8.7*  --  8.4*  MG  --   --   --  1.4* 2.5*   GFR: Estimated Creatinine Clearance: 24.1 mL/min (A) (by C-G formula based on SCr of 1.75 mg/dL (H)). Liver Function Tests: Recent Labs  Lab 08/20/18 1939  AST 24  ALT 30  ALKPHOS 69  BILITOT 1.5*  PROT 7.2  ALBUMIN 3.7   Recent Labs  Lab 08/20/18 1939  LIPASE 108*   No results for input(s): AMMONIA in the last 168 hours. Coagulation Profile: No results for input(s): INR, PROTIME in the last 168 hours. Cardiac Enzymes: No results for input(s): CKTOTAL, CKMB, CKMBINDEX, TROPONINI in the  last 168 hours. BNP (last 3 results) No results for input(s): PROBNP in the last 8760 hours. HbA1C: No results for input(s): HGBA1C in the last 72 hours. CBG: Recent Labs  Lab 08/22/18 2033 08/23/18 0038 08/23/18 0436 08/23/18 0736 08/23/18 1112  GLUCAP 108* 118* 141* 148* 127*   Lipid Profile: No results for input(s): CHOL, HDL, LDLCALC, TRIG, CHOLHDL, LDLDIRECT in the last 72 hours. Thyroid Function Tests: No results for input(s): TSH, T4TOTAL, FREET4, T3FREE, THYROIDAB in the last 72 hours. Anemia Panel: No results for input(s): VITAMINB12, FOLATE, FERRITIN, TIBC, IRON, RETICCTPCT in the last 72 hours. Sepsis Labs: No results for input(s): PROCALCITON, LATICACIDVEN in the last 168 hours.  Recent Results (from the past 240 hour(s))  SARS Coronavirus 2 (CEPHEID - Performed in [place] hospital lab), Hosp Order     Status: None   Collection Time: 08/21/18  1:07 AM   Specimen: Nasopharyngeal Swab  Result Value Ref Range Status   SARS Coronavirus 2 NEGATIVE NEGATIVE Final     Comment: (NOTE) If result is NEGATIVE SARS-CoV-2 target nucleic acids are NOT DETECTED. The SARS-CoV-2 RNA is generally detectable in upper and lower  respiratory specimens during the acute phase of infection. The lowest  concentration of SARS-CoV-2 viral copies this assay can detect is 250  copies / mL. A negative result does not preclude SARS-CoV-2 infection  and should not be used as the sole basis for treatment or other  patient management decisions.  A negative result may occur with  improper specimen collection / handling, submission of specimen other  than nasopharyngeal swab, presence of viral mutation(s) within the  areas targeted by this assay, and inadequate number of viral copies  (<250 copies / mL). A negative result must be combined with clinical  observations, patient history, and epidemiological information. If result is POSITIVE SARS-CoV-2 target nucleic acids are DETECTED. The SARS-CoV-2 RNA is generally detectable in upper and lower  respiratory specimens dur ing the acute phase of infection.  Positive  results are indicative of active infection with SARS-CoV-2.  Clinical  correlation with patient history and other diagnostic information is  necessary to determine patient infection status.  Positive results do  not rule out bacterial infection or co-infection with other viruses. If result is PRESUMPTIVE POSTIVE SARS-CoV-2 nucleic acids MAY BE PRESENT.   A presumptive positive result was obtained on the submitted specimen  and confirmed on repeat testing.  While 2019 novel coronavirus  (SARS-CoV-2) nucleic acids may be present in the submitted sample  additional confirmatory testing may be necessary for epidemiological  and / or clinical management purposes  to differentiate between  SARS-CoV-2 and other Sarbecovirus currently known to infect humans.  If clinically indicated additional testing with an alternate test  methodology [phone]) is advised. The SARS-CoV-2  RNA is generally  detectable in upper and lower respiratory sp ecimens during the acute  phase of infection. The expected result is Negative. Fact Sheet for Patients:  StrictlyIdeas.no Fact Sheet for Healthcare Providers: BankingDealers.co.za This test is not yet approved or cleared by the [place] FDA and has been authorized for detection and/or diagnosis of SARS-CoV-2 by FDA under an Emergency Use Authorization (EUA).  This EUA will remain in effect (meaning this test can be used) for the duration of the COVID-19 declaration under Section 564(b)(1) of the Act, 21 U.S.C. section 360bbb-3(b)(1), unless the authorization is terminated or revoked sooner. Performed at [facility], [place] [address] [place], [place] [address]   Gastrointestinal Panel  by PCR , Stool     Status: None   Collection Time: 08/22/18 12:55 PM   Specimen: Stool  Result Value Ref Range Status   Campylobacter species NOT DETECTED NOT DETECTED Final   Plesimonas shigelloides NOT DETECTED NOT DETECTED Final   Salmonella species NOT DETECTED NOT DETECTED Final   Yersinia enterocolitica NOT DETECTED NOT DETECTED Final   Vibrio species NOT DETECTED NOT DETECTED Final   Vibrio cholerae NOT DETECTED NOT DETECTED Final   Enteroaggregative E coli (EAEC) NOT DETECTED NOT DETECTED Final   Enteropathogenic E coli (EPEC) NOT DETECTED NOT DETECTED Final   Enterotoxigenic E coli (ETEC) NOT DETECTED NOT DETECTED Final   Shiga like toxin producing E coli (STEC) NOT DETECTED NOT DETECTED Final   Shigella/Enteroinvasive E coli (EIEC) NOT DETECTED NOT DETECTED Final   Cryptosporidium NOT DETECTED NOT DETECTED Final   Cyclospora cayetanensis NOT DETECTED NOT DETECTED Final   Entamoeba histolytica NOT DETECTED NOT DETECTED Final   Giardia lamblia NOT DETECTED NOT DETECTED Final   Adenovirus F40/41 NOT DETECTED NOT DETECTED Final   Astrovirus NOT DETECTED NOT DETECTED Final    Norovirus GI/GII NOT DETECTED NOT DETECTED Final   Rotavirus A NOT DETECTED NOT DETECTED Final   Sapovirus (I, II, IV, and V) NOT DETECTED NOT DETECTED Final    Comment: Performed at [facility], [address], [place], [place] [address]         Radiology Studies: No results found.      Scheduled Meds: . apixaban  2.5 mg Oral BID  . insulin aspart  0-9 Units Subcutaneous Q4H  . lipase/protease/amylase  36,000 Units Oral TID WC  . multivitamin with minerals  1 tablet Oral Daily  . pantoprazole  40 mg Oral Daily   Continuous Infusions: . cefTRIAXone (ROCEPHIN)  IV 1 g (08/22/18 2137)  .  sodium bicarbonate  infusion 1000 mL 100 mL/hr at 08/23/18 0040     LOS: 2 days    Time spent: 35 minutes    Irine Seal, MD [facility]  If 7PM-7AM, please contact night-coverage [email or web address] 08/23/2018, 11:39 AM

## 2018-08-23 NOTE — Evaluation (Signed)
Physical Therapy Evaluation Patient Details Name: [name] MRN: [id number] DOB: [birth year] Today's Date: 08/23/2018   History of Present Illness  73 y.o. female with medical history significant of diabetes, hypertension, coronary artery disease, GERD, vertigo, scoliosis, hyperlipidemia, asthma and chronic anticoagulation with Xarelto. Pt admitted with 1 week of persistent diarrhea with some nausea with syncope and acute enteritis    Clinical Impression  Pt admitted with above diagnosis. Pt currently with functional limitations due to the deficits listed below (see PT Problem List). PTA pt living alone independent with mobility. Today, ambulating short distance with RW, no overt LOB. Pt reports feeling far from baseline strength overall. HHPT for safe progression.  Pt will benefit from skilled PT to increase their independence and safety with mobility to allow discharge to the venue listed below.       Follow Up Recommendations Home health PT    Equipment Recommendations  None recommended by PT    Recommendations for Other Services OT consult     Precautions / Restrictions Precautions Precautions: Fall Restrictions Weight Bearing Restrictions: No      Mobility  Bed Mobility Overal bed mobility: Modified Independent                Transfers Overall transfer level: Needs assistance Equipment used: Rolling walker (2 wheeled) Transfers: Sit to/from Stand Sit to Stand: Min guard         General transfer comment: min guard for safety and stability  Ambulation/Gait Ambulation/Gait assistance: Supervision Gait Distance (Feet): 30 Feet Assistive device: Rolling walker (2 wheeled) Gait Pattern/deviations: Step-through pattern;Decreased stride length Gait velocity: decreased   General Gait Details: safe with RW, no overt LOB  Stairs            Wheelchair Mobility    Modified Rankin (Stroke Patients Only)       Balance Overall balance assessment:  Needs assistance Sitting-balance support: No upper extremity supported;Feet supported Sitting balance-Leahy Scale: Good     Standing balance support: No upper extremity supported;During functional activity Standing balance-Leahy Scale: Good Standing balance comment: standing to complete pericare;mingaurd for stability                              Pertinent Vitals/Pain      Home Living Family/patient expects to be discharged to:: Private residence Living Arrangements: Alone   Type of Home: House Home Access: Level entry     Home Layout: One level Home Equipment: Environmental consultant - 4 wheels;Bedside commode      Prior Function Level of Independence: Independent         Comments: pt was driving, grocery shopping, etc.      Hand Dominance   Dominant Hand: Right    Extremity/Trunk Assessment   Upper Extremity Assessment Upper Extremity Assessment: Generalized weakness         Cervical / Trunk Assessment Cervical / Trunk Assessment: Normal  Communication   Communication: No difficulties  Cognition Arousal/Alertness: Awake/alert Behavior During Therapy: WFL for tasks assessed/performed Overall Cognitive Status: Within Functional Limits for tasks assessed                                        General Comments      Exercises     Assessment/Plan    PT Assessment Patient needs continued PT services  PT Problem List Decreased strength;Decreased  mobility;Decreased activity tolerance;Decreased balance;Decreased knowledge of use of DME       PT Treatment Interventions Gait training;Therapeutic exercise;Patient/family education;Functional mobility training;DME instruction;Therapeutic activities    PT Goals (Current goals can be found in the Care Plan section)  Acute Rehab PT Goals Patient Stated Goal: to get stronger PT Goal Formulation: With patient Time For Goal Achievement: 09/04/18 Potential to Achieve Goals: Good    Frequency Min  3X/week   Barriers to discharge Decreased caregiver support      Co-evaluation               AM-PAC PT "6 Clicks" Mobility  Outcome Measure Help needed turning from your back to your side while in a flat bed without using bedrails?: None Help needed moving from lying on your back to sitting on the side of a flat bed without using bedrails?: None Help needed moving to and from a bed to a chair (including a wheelchair)?: None Help needed standing up from a chair using your arms (e.g., wheelchair or bedside chair)?: None Help needed to walk in hospital room?: A Little Help needed climbing 3-5 steps with a railing? : A Little 6 Click Score: 22    End of Session Equipment Utilized During Treatment: Gait belt Activity Tolerance: Patient tolerated treatment well Patient left: in bed;with call bell/phone within reach;with nursing/sitter in room Nurse Communication: Mobility status PT Visit Diagnosis: Other abnormalities of gait and mobility (R26.89);Muscle weakness (generalized) (M62.81)    Time: 7209-4709 PT Time Calculation (min) (ACUTE ONLY): 23 min   Charges:   PT Evaluation $PT Eval Low Complexity: 1 Low PT Treatments $Gait Training: 8-22 mins        Reinaldo Berber, PT, DPT Acute Rehabilitation Services Pager: [phone] Office: [phone]    Reinaldo Berber 08/23/2018, 5:53 PM

## 2018-08-23 NOTE — Plan of Care (Signed)
  Problem: Activity: Goal: Activity intolerance will improve Outcome: Completed/Met   Problem: Fluid Volume: Goal: Fluid volume balance will be maintained or improved Outcome: Completed/Met   Problem: Nutritional: Goal: Ability to make appropriate dietary choices will improve Outcome: Completed/Met   Problem: Urinary Elimination: Goal: Progression of disease will be identified and treated Outcome: Completed/Met

## 2018-08-23 NOTE — Progress Notes (Signed)
Results for AZALIA, NEUBERGER" (MRN [id number]) as of 08/23/2018 05:15  Ref. Range 08/23/2018 03:24  Potassium Latest Ref Range: 3.5 - 5.1 mmol/L 2.5 (LL)  MD paged.

## 2018-08-23 NOTE — Progress Notes (Signed)
Occupational Therapy Evaluation Patient Details Name: [name] MRN: [id number] DOB: 10/29/1945 Today's Date: 08/23/2018    History of Present Illness 73 y.o. female with medical history significant of diabetes, hypertension, coronary artery disease, GERD, vertigo, scoliosis, hyperlipidemia, asthma and chronic anticoagulation with Xarelto. Pt admitted with 1 week of persistent diarrhea with some nausea with syncope and acute enteritis   Clinical Impression   PTA, pt was living at home alone, and reports she was independent with ADL/IADL and modified independent with  functional mobility with use of rollator. Pt currently requires minguard-S with ADL and functional mobility at RW level. Anticipate pt is close to baseline and will continue to progress toward baseline without need for continued services. Will continue to follow pt acutely and progress as tolerated to maximize safety and independence with ADL/IADL and functional mobility.     Follow Up Recommendations  No OT follow up    Equipment Recommendations  None recommended by OT    Recommendations for Other Services       Precautions / Restrictions Precautions Precautions: Fall Restrictions Weight Bearing Restrictions: No      Mobility Bed Mobility Overal bed mobility: Modified Independent                Transfers Overall transfer level: Needs assistance Equipment used: Rolling walker (2 wheeled) Transfers: Sit to/from Stand Sit to Stand: Min guard         General transfer comment: min guard for safety and stability    Balance Overall balance assessment: Needs assistance Sitting-balance support: No upper extremity supported;Feet supported Sitting balance-Leahy Scale: Good     Standing balance support: No upper extremity supported;During functional activity Standing balance-Leahy Scale: Good Standing balance comment: standing to complete pericare;mingaurd for stability                             ADL either performed or assessed with clinical judgement   ADL Overall ADL's : Needs assistance/impaired Eating/Feeding: Set up;Sitting Eating/Feeding Details (indicate cue type and reason): pt eating ice chips upon arrival  Grooming: Standing;Min guard Grooming Details (indicate cue type and reason): S-minguard to complete grooming standing at sink level  Upper Body Bathing: Min guard;Sitting   Lower Body Bathing: Min guard;Sit to/from stand   Upper Body Dressing : Min guard;Sitting   Lower Body Dressing: Min guard;Sit to/from stand   Toilet Transfer: Min guard;Ambulation;RW Toilet Transfer Details (indicate cue type and reason): pt ambulated from bed to commode with minguard for safety  Toileting- Clothing Manipulation and Hygiene: Min guard;Sit to/from stand       Functional mobility during ADLs: Min guard;Rolling walker       Vision Baseline Vision/History: Wears glasses Wears Glasses: At all times Patient Visual Report: No change from baseline       Perception     Praxis      Pertinent Vitals/Pain Pain Assessment: No/denies pain     Hand Dominance Right   Extremity/Trunk Assessment Upper Extremity Assessment Upper Extremity Assessment: Generalized weakness   Lower Extremity Assessment Lower Extremity Assessment: Generalized weakness   Cervical / Trunk Assessment Cervical / Trunk Assessment: Normal   Communication Communication Communication: No difficulties   Cognition Arousal/Alertness: Awake/alert Behavior During Therapy: WFL for tasks assessed/performed Overall Cognitive Status: Within Functional Limits for tasks assessed  General Comments  vss    Exercises     Shoulder Instructions      Home Living Family/patient expects to be discharged to:: Private residence Living Arrangements: Alone   Type of Home: House Home Access: Level entry     Home Layout: One level      Bathroom Shower/Tub: Teacher, early years/pre: Standard Bathroom Accessibility: Yes How Accessible: Accessible via walker Home Equipment: [employer] - 4 wheels;Bedside commode          Prior Functioning/Environment Level of Independence: Independent        Comments: pt was driving, grocery shopping, etc.         OT Problem List: Decreased activity tolerance;Impaired balance (sitting and/or standing);Decreased safety awareness      OT Treatment/Interventions: Self-care/ADL training;Therapeutic exercise;DME and/or AE instruction;Therapeutic activities;Patient/family education;Balance training    OT Goals(Current goals can be found in the care plan section) Acute Rehab OT Goals Patient Stated Goal: to get stronger OT Goal Formulation: With patient Time For Goal Achievement: 09/06/18 Potential to Achieve Goals: Good ADL Goals Pt Will Perform Grooming: Independently Pt Will Perform Upper Body Dressing: Independently Pt Will Perform Lower Body Dressing: Independently;sit to/from stand Pt Will Transfer to Toilet: Independently Pt Will Perform Tub/Shower Transfer: Independently  OT Frequency: Min 2X/week   Barriers to D/C: Decreased caregiver support  pt lives alone       Co-evaluation              AM-PAC OT "6 Clicks" Daily Activity     Outcome Measure Help from another person eating meals?: None Help from another person taking care of personal grooming?: A Little Help from another person toileting, which includes using toliet, bedpan, or urinal?: A Little Help from another person bathing (including washing, rinsing, drying)?: A Little Help from another person to put on and taking off regular upper body clothing?: A Little Help from another person to put on and taking off regular lower body clothing?: A Little 6 Click Score: 19   End of Session Equipment Utilized During Treatment: Gait belt;Rolling walker Nurse Communication: Mobility status  Activity  Tolerance: Patient tolerated treatment well Patient left: in bed;with call bell/phone within reach  OT Visit Diagnosis: Unsteadiness on feet (R26.81);Other abnormalities of gait and mobility (R26.89);Muscle weakness (generalized) (M62.81)                Time: 0086-7619 OT Time Calculation (min): 23 min Charges:  OT General Charges $OT Visit: 1 Visit OT Evaluation $OT Eval Moderate Complexity: 1 Mod OT Treatments $Self Care/Home Management : 8-22 mins  Dorinda Hill OTR/L Acute Rehabilitation Services Office: [phone]   Wyn Forster 08/23/2018, 1:50 PM

## 2018-08-23 NOTE — Progress Notes (Signed)
Subjective: Since I last evaluated the patient, she continues to have diarrhea and seems to be very distressed about this. According to my discussion with her nurse she had 3 small vol;ume BM's today and 3 yestrerday. There is no blood in the stool. She is gradually transitioning to regular meals. The colonoscopy done yesterday was essentially normal except for patchy loss of vascular markings-random biopsies were done and few sigmoid diverticula were noted.      Objective: Vital signs in last 24 hours: Temp:  [97.8 F (36.6 C)-98.8 F (37.1 C)] 98.3 F (36.8 C) (07/09 0950) Pulse Rate:  [60-77] 65 (07/09 0950) Resp:  [11-19] 18 (07/09 0950) BP: (111-163)/(41-80) 157/76 (07/09 0950) SpO2:  [97 %-100 %] 97 % (07/09 0950) Last BM Date: 08/23/18  Intake/Output from previous day: 07/08 0701 - 07/09 0700 In: 2098.7 [P.O.:720; I.V.:1128.7; IV Piggyback:250.1] Out: 1601 [Urine:1000; Stool:600; Blood:1] Intake/Output this shift: No intake/output data recorded.  GPE was not done today as the patient was in the bathroom   Lab Results: Recent Labs    08/20/18 1939 08/22/18 1117 08/23/18 0324  WBC 7.0 7.5 7.9  HGB 12.7 11.0* 10.4*  HCT 40.8 33.8* 30.6*  PLT 286 221 199   BMET Recent Labs    08/21/18 0501 08/22/18 0520 08/23/18 0324  NA 147* 143 142  K 4.0 3.2* 2.5*  CL 123* 120* 111  CO2 13* 13* 20*  GLUCOSE 91 90 140*  BUN 52* 39* 22  CREATININE 3.15* 2.65* 1.75*  CALCIUM 8.8* 8.7* 8.4*   LFT Recent Labs    08/20/18 1939  PROT 7.2  ALBUMIN 3.7  AST 24  ALT 30  ALKPHOS 69  BILITOT 1.5*   Medications: I have reviewed the patient's current medications.  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  2) GERD/Hiatal hernia-on PPi's. 3) Hypokalemia-needs repletion. Marland Kitchen 4) IDA.  LOS: 2 days   Juanita Craver 08/23/2018, 12:59 PM

## 2018-08-24 LAB — CBC
HCT: 31.7 % — ABNORMAL LOW (ref 36.0–46.0)
Hemoglobin: 10.9 g/dL — ABNORMAL LOW (ref 12.0–15.0)
MCH: 31.3 pg (ref 26.0–34.0)
MCHC: 34.4 g/dL (ref 30.0–36.0)
MCV: 91.1 fL (ref 80.0–100.0)
Platelets: 197 10*3/uL (ref 150–400)
RBC: 3.48 MIL/uL — ABNORMAL LOW (ref 3.87–5.11)
RDW: 14.1 % (ref 11.5–15.5)
WBC: 9.7 10*3/uL (ref 4.0–10.5)
nRBC: 0 % (ref 0.0–0.2)

## 2018-08-24 LAB — BASIC METABOLIC PANEL
Anion gap: 12 (ref 5–15)
BUN: 11 mg/dL (ref 8–23)
CO2: 33 mmol/L — ABNORMAL HIGH (ref 22–32)
Calcium: 8.1 mg/dL — ABNORMAL LOW (ref 8.9–10.3)
Chloride: 101 mmol/L (ref 98–111)
Creatinine, Ser: 1.38 mg/dL — ABNORMAL HIGH (ref 0.44–1.00)
GFR calc Af Amer: 44 mL/min — ABNORMAL LOW (ref >60)
GFR calc non Af Amer: 38 mL/min — ABNORMAL LOW (ref >60)
Glucose, Bld: 145 mg/dL — ABNORMAL HIGH (ref 70–99)
Potassium: 2.8 mmol/L — ABNORMAL LOW (ref 3.5–5.1)
Sodium: 146 mmol/L — ABNORMAL HIGH (ref 135–145)

## 2018-08-24 LAB — GLUCOSE, CAPILLARY
Glucose-Capillary: 101 mg/dL — ABNORMAL HIGH (ref 70–99)
Glucose-Capillary: 101 mg/dL — ABNORMAL HIGH (ref 70–99)
Glucose-Capillary: 114 mg/dL — ABNORMAL HIGH (ref 70–99)
Glucose-Capillary: 130 mg/dL — ABNORMAL HIGH (ref 70–99)
Glucose-Capillary: 137 mg/dL — ABNORMAL HIGH (ref 70–99)
Glucose-Capillary: 166 mg/dL — ABNORMAL HIGH (ref 70–99)

## 2018-08-24 LAB — MAGNESIUM: Magnesium: 1.5 mg/dL — ABNORMAL LOW (ref 1.7–2.4)

## 2018-08-24 MED ORDER — SODIUM CHLORIDE 0.9 % IV SOLN
8.0000 mg | Freq: Once | INTRAVENOUS | Status: AC
  Administered 2018-08-24: 8 mg via INTRAVENOUS
  Filled 2018-08-24: qty 4

## 2018-08-24 MED ORDER — SODIUM CHLORIDE 0.45 % IV SOLN: INTRAVENOUS | Status: DC

## 2018-08-24 MED ORDER — HYDRALAZINE HCL 25 MG PO TABS
25.0000 mg | ORAL_TABLET | Freq: Two times a day (BID) | ORAL | Status: DC
  Administered 2018-08-24 – 2018-08-26 (×6): 25 mg via ORAL
  Filled 2018-08-24 (×7): qty 1

## 2018-08-24 MED ORDER — POTASSIUM CHLORIDE 20 MEQ PO PACK
40.0000 meq | PACK | ORAL | Status: DC
  Administered 2018-08-24: 40 meq via ORAL
  Filled 2018-08-24 (×2): qty 2

## 2018-08-24 MED ORDER — MAGNESIUM SULFATE 4 GM/100ML IV SOLN
4.0000 g | Freq: Once | INTRAVENOUS | Status: AC
  Administered 2018-08-24: 4 g via INTRAVENOUS
  Filled 2018-08-24: qty 100

## 2018-08-24 MED ORDER — LORAZEPAM 2 MG/ML IJ SOLN
0.5000 mg | Freq: Once | INTRAMUSCULAR | Status: AC
  Administered 2018-08-24: 0.5 mg via INTRAVENOUS
  Filled 2018-08-24: qty 1

## 2018-08-24 MED ORDER — POTASSIUM CHLORIDE 20 MEQ PO PACK
40.0000 meq | PACK | ORAL | Status: DC
  Filled 2018-08-24 (×2): qty 2

## 2018-08-24 MED ORDER — POTASSIUM CHLORIDE 10 MEQ/100ML IV SOLN
10.0000 meq | INTRAVENOUS | Status: DC
  Administered 2018-08-24: 10 meq via INTRAVENOUS
  Filled 2018-08-24: qty 100

## 2018-08-24 MED ORDER — SODIUM CHLORIDE 0.45 % IV SOLN
INTRAVENOUS | Status: DC
  Administered 2018-08-24 – 2018-08-25 (×2): via INTRAVENOUS
  Filled 2018-08-24 (×4): qty 1000

## 2018-08-24 MED ORDER — LOPERAMIDE HCL 2 MG PO CAPS
4.0000 mg | ORAL_CAPSULE | Freq: Once | ORAL | Status: AC
  Administered 2018-08-24: 4 mg via ORAL
  Filled 2018-08-24: qty 2

## 2018-08-24 MED ORDER — POTASSIUM CHLORIDE 10 MEQ/100ML IV SOLN
10.0000 meq | INTRAVENOUS | Status: AC
  Administered 2018-08-24 (×4): 10 meq via INTRAVENOUS
  Filled 2018-08-24 (×2): qty 100

## 2018-08-24 NOTE — Progress Notes (Signed)
Notified MD Grandville Silos via secure message that pt refused PO K supplement due to  Nausea even after getting PRN Zofran IV per orders.   Paulla Fore, RN

## 2018-08-24 NOTE — Progress Notes (Signed)
Occupational Therapy Treatment Patient Details Name: [name] MRN: [id number] DOB: 04-Jul-1945 Today's Date: 08/24/2018    History of present illness 73 y.o. female with medical history significant of diabetes, hypertension, coronary artery disease, GERD, vertigo, scoliosis, hyperlipidemia, asthma and chronic anticoagulation with Xarelto. Pt admitted with 1 week of persistent diarrhea with some nausea with syncope and acute enteritis   OT comments  Pt slowly progressing toward OT goals. Pt complaints of some pain and weakness during today's session, however agreeable to participate. Pt demonstrated Mod I bed mobility, Min guard functional transfer to sink for grooming tasks, and Min guard for safety during dynamic standing with RW. DC and freq remains the same. OT will continue to follow up.     Follow Up Recommendations  No OT follow up    Equipment Recommendations  None recommended by OT    Recommendations for Other Services      Precautions / Restrictions Precautions Precautions: Fall Restrictions Weight Bearing Restrictions: No       Mobility Bed Mobility Overal bed mobility: Modified Independent Bed Mobility: Supine to Sit;Sit to Supine     Supine to sit: Modified independent (Device/Increase time) Sit to supine: Modified independent (Device/Increase time)   General bed mobility comments: HOB flat without rail  Transfers Overall transfer level: Needs assistance Equipment used: Rolling walker (2 wheeled) Transfers: Sit to/from Stand Sit to Stand: Min guard         General transfer comment: min guard for safety and stability    Balance Overall balance assessment: Needs assistance Sitting-balance support: No upper extremity supported;Feet supported Sitting balance-Leahy Scale: Good     Standing balance support: No upper extremity supported;During functional activity Standing balance-Leahy Scale: Good Standing balance comment: mingaurd for stability                             ADL either performed or assessed with clinical judgement   ADL Overall ADL's : Needs assistance/impaired     Grooming: Standing;Min guard Grooming Details (indicate cue type and reason): minguard for safety to complete grooming standing at sink level                  Toilet Transfer: Min guard;Ambulation;RW Toilet Transfer Details (indicate cue type and reason): pt ambulated from bed to sink with minguard for safety          Functional mobility during ADLs: Min guard;Rolling walker General ADL Comments: Grooming tasks completed at sink.      Vision       Perception     Praxis      Cognition Arousal/Alertness: Awake/alert Behavior During Therapy: WFL for tasks assessed/performed Overall Cognitive Status: Within Functional Limits for tasks assessed                                          Exercises     Shoulder Instructions       General Comments      Pertinent Vitals/ Pain       Pain Assessment: Faces Faces Pain Scale: Hurts a little bit Pain Location: L arm Pain Descriptors / Indicators: Guarding Pain Intervention(s): Monitored during session;Repositioned  Home Living  Prior Functioning/Environment              Frequency  Min 2X/week        Progress Toward Goals  OT Goals(current goals can now be found in the care plan section)  Progress towards OT goals: Progressing toward goals  Acute Rehab OT Goals Patient Stated Goal: to get stronger OT Goal Formulation: With patient Time For Goal Achievement: 09/06/18 Potential to Achieve Goals: Good ADL Goals Pt Will Perform Grooming: Independently Pt Will Perform Upper Body Dressing: Independently Pt Will Perform Lower Body Dressing: Independently;sit to/from stand Pt Will Transfer to Toilet: Independently Pt Will Perform Tub/Shower Transfer: Independently  Plan Discharge plan  remains appropriate;Frequency remains appropriate    Co-evaluation                 AM-PAC OT "6 Clicks" Daily Activity     Outcome Measure   Help from another person eating meals?: None Help from another person taking care of personal grooming?: A Little Help from another person toileting, which includes using toliet, bedpan, or urinal?: A Little Help from another person bathing (including washing, rinsing, drying)?: A Little Help from another person to put on and taking off regular upper body clothing?: A Little Help from another person to put on and taking off regular lower body clothing?: A Little 6 Click Score: 19    End of Session Equipment Utilized During Treatment: Gait belt;Rolling walker  OT Visit Diagnosis: Unsteadiness on feet (R26.81);Other abnormalities of gait and mobility (R26.89);Muscle weakness (generalized) (M62.81)   Activity Tolerance Patient tolerated treatment well   Patient Left in bed;with call bell/phone within reach   Nurse Communication Mobility status        Time: 7062-3762 OT Time Calculation (min): 18 min  Charges: OT General Charges $OT Visit: 1 Visit OT Treatments $Self Care/Home Management : 8-22 mins  Minus Breeding, MSOT, OTR/L  Supplemental Rehabilitation Services  [phone]    Marius Ditch 08/24/2018, 11:22 AM

## 2018-08-24 NOTE — Progress Notes (Signed)
Notified MD Grandville Silos of k=2.8  Paulla Fore, RN

## 2018-08-24 NOTE — Progress Notes (Signed)
PROGRESS NOTE    [name]  GYI:[id number] DOB: 11/20/1945 DOA: 08/20/2018 PCP: [name], MD    Brief Narrative:  73 year old female who presented with nausea, vomiting and diarrhea.  He does have significant past medical history for type 2 diabetes mellitus, hypertension and dyslipidemia.  She reported symptoms persistent for the last month, hospitalization June 17 to June 26 for similar symptoms.  She had extensive gastrointestinal work-up with no conclusive diagnosis.  Initial physical examination blood pressure 122/89, heart rate 86, respirate 19, oxygen saturation 100%, moist mucous membranes, lungs clear to auscultation bilaterally, heart S1-S2 present and rhythmic, abdomen soft nontender, no lower extremity edema sodium 142, potassium 4.4, chloride 118, bicarb 11, glucose 95, BUN 53, creatinine 3.89, white count 7.0, hemoglobin 12.7, hematocrit 40.8, platelets 286, SARS COVID-19 was negative, urinalysis more than 50 white cells, 11-20 red cells, specific gravity 1.017, 100 protein.  CT of the abdomen of the abdomen with fluid-filled bowel, no acute changes.  Patient was admitted to the hospital working diagnosis of acute kidney injury due to acute diarrhea.   Assessment & Plan:   Principal Problem:   Acute kidney failure (HCC) Active Problems:   Diabetes type 2, controlled ([place])   Pulmonary embolism (HCC)   Nausea vomiting and diarrhea   Acute lower UTI   Dehydration   Vomiting and diarrhea  1 hypovolemic acute kidney injury with non-anion gap metabolic acidosis Likely secondary to prerenal azotemia secondary to severe diarrhea that patient had presented with decreased oral intake.  Creatinine on admission was 3.89 from 0.82 on 08/10/2018.  Urinalysis with large leukocytes nitrite negative greater than 50 WBCs.  100 protein noted in urine.  CT abdomen and pelvis with no hydronephrosis or perinephric edema.  Unchanged cystic lesion in the right mid kidney from recent  exams.  Urinary bladder is nondistended.  No bladder wall thickening noted.  Urine output recorded of 1 L over the past 24 hours however not accurate.  Acidosis improving with bicarb drip.  Renal function slowly trending down creatinine currently at 1.38.  Avoid nephrotoxins.  Discontinue bicarb drip and place on half-normal saline with KCl. Follow.    2.  Hypokalemia/hypomagnesemia. Likely secondary to GI losses.  Potassium currently at 2.8.  Patient complaining of burning from IV potassium.  Patient also hesitant to take oral potassium as she feels she is going to be nauseous with emesis.  We will give a dose of IV Compazine and gave oral potassium packet 40 mEq x 1.  Magnesium at 1.5.  We will give magnesium 4 g IV x1.  Repeat labs in the morning.    3.  Urinary tract infection Urine cultures negative.  Patient with 3 days IV antibiotics.  DC IV Rocephin.  4.  Acute on chronic diarrhea Questionable etiology.  GI pathogen panel negative. GI work-up during last hospitalization was unremarkable with GI infectious panel including C. difficile been negative.  Patient still with loose stools.  GI consulted patient seen in consultation by Dr. Collene Mares, who was recommending an unprepped colonoscopy to be done today to obtain mucosal so biopsies to rule out microscopic colitis.  Per GI patient also with fatty atrophic pancreas, exocrine pancreatic insufficiency which may be contributing to patient's diarrhea.  Patient started on pancreatic supplementation.  Patient underwent unprepped colonoscopy on 08/22/2018 per Dr. Collene Mares with biopsies taken to rule out microscopic colitis.  Patient still with loose stools however volume decreasing per RN.  Continue Lomotil as needed.  We will give  a dose of Imodium 4 mg p.o. x1.  Continue IV fluids, supportive care.  May need to be placed on scheduled Imodium however will defer to GI.  Per GI.   5.  Pulmonary embolism Patient noted to be on Xarelto at home prior to admission.  CT  chest from November 2019 with nonocclusive filling defect in the distal left main pulmonary artery.  Eliquis started at prophylactic dose of 2.5 mg twice daily.  Anticoagulation resumed after being held for unprepped colonoscopy on 08/22/2018.  No signs of bleeding.  Follow.   6.  Type 2 diabetes Hemoglobin A1c was 6.4 on 04/30/2018.  CBG of 166 this morning.  Continue sliding scale insulin.   DVT prophylaxis: Eliquis. Code Status: Full Family Communication: Updated patient.  No family at bedside. Disposition Plan: Home when diarrhea improved and when okay with GI.   Consultants:   Gastroenterology: Dr. Collene Mares 08/21/2018  Procedures:   CT abdomen and pelvis 08/21/2018  Unprepped colonoscopy per Dr. Collene Mares 08/22/2018  Antimicrobials:  IV Rocephin 08/21/2018>>>>>> 08/24/2018   Subjective: Patient still with multiple loose stools per RN. States not feeling too well.  Complaining of burning in the IV while getting IV potassium.  Hesitant to take oral potassium because she states she is nauseous and she will throw it up.  Does not feel well today.  No chest pain.  No shortness of breath.  Poor oral intake.   Objective: Vitals:   08/23/18 1621 08/23/18 2012 08/24/18 0408 08/24/18 0932  BP: (!) 171/86 (!) 160/85 (!) 168/76 (!) 157/91  Pulse: (!) 59 69 60 (!) 58  Resp: 18 15 16 18   Temp: 98.7 F (37.1 C) 98.8 F (37.1 C) 98.5 F (36.9 C) 98.3 F (36.8 C)  TempSrc: Oral Oral Oral Oral  SpO2: 100% 97% 96% 99%  Weight:  64.1 kg    Height:        Intake/Output Summary (Last 24 hours) at 08/24/2018 1219 Last data filed at 08/24/2018 0900 Gross per 24 hour  Intake 3008.79 ml  Output 400 ml  Net 2608.79 ml   Filed Weights   08/20/18 1912 08/21/18 0109 08/23/18 2012  Weight: 63.5 kg 63.5 kg 64.1 kg    Examination:  General exam: NAD. Respiratory system: Lungs clear to auscultation bilaterally.  No wheezes, no crackles, no rhonchi.  Normal respiratory effort.  Cardiovascular system: RRR  no murmurs rubs or gallops.  No JVD.  No lower extremity edema.  Gastrointestinal system: Abdomen is soft, nontender, nondistended, positive bowel sounds.  No rebound.  No guarding.  Central nervous system: Alert and oriented. No focal neurological deficits. Extremities: Symmetric 5 x 5 power. Skin: No rashes, lesions or ulcers Psychiatry: Judgement and insight appear normal. Mood & affect appropriate.     Data Reviewed: I have personally reviewed following labs and imaging studies  CBC: Recent Labs  Lab 08/20/18 1939 08/22/18 1117 08/23/18 0324 08/24/18 0515  WBC 7.0 7.5 7.9 9.7  NEUTROABS  --  5.1 5.1  --   HGB 12.7 11.0* 10.4* 10.9*  HCT 40.8 33.8* 30.6* 31.7*  MCV 101.0* 97.1 91.6 91.1  PLT 286 221 199 333   Basic Metabolic Panel: Recent Labs  Lab 08/21/18 0501 08/22/18 0520 08/22/18 1117 08/23/18 0324 08/23/18 1312 08/24/18 0515  NA 147* 143  --  142 144 146*  K 4.0 3.2*  --  2.5* 4.7 2.8*  CL 123* 120*  --  111 111 101  CO2 13* 13*  --  20* 24  33*  GLUCOSE 91 90  --  140* 143* 145*  BUN 52* 39*  --  22 20 11   CREATININE 3.15* 2.65*  --  1.75* 1.67* 1.38*  CALCIUM 8.8* 8.7*  --  8.4* 8.4* 8.1*  MG  --   --  1.4* 2.5*  --  1.5*   GFR: Estimated Creatinine Clearance: 30.7 mL/min (A) (by C-G formula based on SCr of 1.38 mg/dL (H)). Liver Function Tests: Recent Labs  Lab 08/20/18 1939  AST 24  ALT 30  ALKPHOS 69  BILITOT 1.5*  PROT 7.2  ALBUMIN 3.7   Recent Labs  Lab 08/20/18 1939  LIPASE 108*   No results for input(s): AMMONIA in the last 168 hours. Coagulation Profile: No results for input(s): INR, PROTIME in the last 168 hours. Cardiac Enzymes: No results for input(s): CKTOTAL, CKMB, CKMBINDEX, TROPONINI in the last 168 hours. BNP (last 3 results) No results for input(s): PROBNP in the last 8760 hours. HbA1C: No results for input(s): HGBA1C in the last 72 hours. CBG: Recent Labs  Lab 08/23/18 2013 08/23/18 2349 08/24/18 0410  08/24/18 0749 08/24/18 1118  GLUCAP 166* 124* 137* 166* 114*   Lipid Profile: No results for input(s): CHOL, HDL, LDLCALC, TRIG, CHOLHDL, LDLDIRECT in the last 72 hours. Thyroid Function Tests: No results for input(s): TSH, T4TOTAL, FREET4, T3FREE, THYROIDAB in the last 72 hours. Anemia Panel: No results for input(s): VITAMINB12, FOLATE, FERRITIN, TIBC, IRON, RETICCTPCT in the last 72 hours. Sepsis Labs: No results for input(s): PROCALCITON, LATICACIDVEN in the last 168 hours.  Recent Results (from the past 240 hour(s))  SARS Coronavirus 2 (CEPHEID - Performed in [facility] hospital lab), Hosp Order     Status: None   Collection Time: 08/21/18  1:07 AM   Specimen: Nasopharyngeal Swab  Result Value Ref Range Status   SARS Coronavirus 2 NEGATIVE NEGATIVE Final    Comment: (NOTE) If result is NEGATIVE SARS-CoV-2 target nucleic acids are NOT DETECTED. The SARS-CoV-2 RNA is generally detectable in upper and lower  respiratory specimens during the acute phase of infection. The lowest  concentration of SARS-CoV-2 viral copies this assay can detect is 250  copies / mL. A negative result does not preclude SARS-CoV-2 infection  and should not be used as the sole basis for treatment or other  patient management decisions.  A negative result may occur with  improper specimen collection / handling, submission of specimen other  than nasopharyngeal swab, presence of viral mutation(s) within the  areas targeted by this assay, and inadequate number of viral copies  (<250 copies / mL). A negative result must be combined with clinical  observations, patient history, and epidemiological information. If result is POSITIVE SARS-CoV-2 target nucleic acids are DETECTED. The SARS-CoV-2 RNA is generally detectable in upper and lower  respiratory specimens dur ing the acute phase of infection.  Positive  results are indicative of active infection with SARS-CoV-2.  Clinical  correlation with patient  history and other diagnostic information is  necessary to determine patient infection status.  Positive results do  not rule out bacterial infection or co-infection with other viruses. If result is PRESUMPTIVE POSTIVE SARS-CoV-2 nucleic acids MAY BE PRESENT.   A presumptive positive result was obtained on the submitted specimen  and confirmed on repeat testing.  While 2019 novel coronavirus  (SARS-CoV-2) nucleic acids may be present in the submitted sample  additional confirmatory testing may be necessary for epidemiological  and / or clinical management purposes  to differentiate between  SARS-CoV-2 and other Sarbecovirus currently known to infect humans.  If clinically indicated additional testing with an alternate test  methodology [id number]) is advised. The SARS-CoV-2 RNA is generally  detectable in upper and lower respiratory sp ecimens during the acute  phase of infection. The expected result is Negative. Fact Sheet for Patients:  StrictlyIdeas.no Fact Sheet for Healthcare Providers: BankingDealers.co.za This test is not yet approved or cleared by the [place] FDA and has been authorized for detection and/or diagnosis of SARS-CoV-2 by FDA under an Emergency Use Authorization (EUA).  This EUA will remain in effect (meaning this test can be used) for the duration of the COVID-19 declaration under Section 564(b)(1) of the Act, 21 U.S.C. section 360bbb-3(b)(1), unless the authorization is terminated or revoked sooner. Performed at [facility], [place] [address], [place], [place] [address]   Gastrointestinal Panel by PCR , Stool     Status: None   Collection Time: 08/22/18 12:55 PM   Specimen: Stool  Result Value Ref Range Status   Campylobacter species NOT DETECTED NOT DETECTED Final   Plesimonas shigelloides NOT DETECTED NOT DETECTED Final   Salmonella species NOT DETECTED NOT DETECTED Final   Yersinia enterocolitica NOT  DETECTED NOT DETECTED Final   Vibrio species NOT DETECTED NOT DETECTED Final   Vibrio cholerae NOT DETECTED NOT DETECTED Final   Enteroaggregative E coli (EAEC) NOT DETECTED NOT DETECTED Final   Enteropathogenic E coli (EPEC) NOT DETECTED NOT DETECTED Final   Enterotoxigenic E coli (ETEC) NOT DETECTED NOT DETECTED Final   Shiga like toxin producing E coli (STEC) NOT DETECTED NOT DETECTED Final   Shigella/Enteroinvasive E coli (EIEC) NOT DETECTED NOT DETECTED Final   Cryptosporidium NOT DETECTED NOT DETECTED Final   Cyclospora cayetanensis NOT DETECTED NOT DETECTED Final   Entamoeba histolytica NOT DETECTED NOT DETECTED Final   Giardia lamblia NOT DETECTED NOT DETECTED Final   Adenovirus F40/41 NOT DETECTED NOT DETECTED Final   Astrovirus NOT DETECTED NOT DETECTED Final   Norovirus GI/GII NOT DETECTED NOT DETECTED Final   Rotavirus A NOT DETECTED NOT DETECTED Final   Sapovirus (I, II, IV, and V) NOT DETECTED NOT DETECTED Final    Comment: Performed at [facility], [address] [place], [place] [address]  Culture, Urine     Status: None   Collection Time: 08/22/18  9:19 PM   Specimen: Urine, Random  Result Value Ref Range Status   Specimen Description URINE, RANDOM  Final   Special Requests NONE  Final   Culture   Final    NO GROWTH Performed at [facility], [address] [place], [place] [address]    Report Status 08/23/2018 FINAL  Final         Radiology Studies: No results found.      Scheduled Meds: . apixaban  2.5 mg Oral BID  . hydrALAZINE  25 mg Oral BID  . insulin aspart  0-9 Units Subcutaneous Q4H  . lipase/protease/amylase  36,000 Units Oral TID WC  . loperamide  4 mg Oral Once  . multivitamin with minerals  1 tablet Oral Daily  . nebivolol  5 mg Oral Daily  . pantoprazole  40 mg Oral Daily  . pravastatin  20 mg Oral q1800   Continuous Infusions: . potassium chloride 10 mEq (08/24/18 0950)  . sodium chloride 0.45 % 1,000 mL with  potassium chloride 40 mEq infusion 100 mL/hr at 08/24/18 1014     LOS: 3 days    Time  spent: 35 minutes    Irine Seal, MD [facility]  If 7PM-7AM, please contact night-coverage [email or web address] 08/24/2018, 12:19 PM

## 2018-08-24 NOTE — Progress Notes (Signed)
Daughter, [name], called and was updated. All questions answered.  Paulla Fore, RN

## 2018-08-24 NOTE — Care Management Important Message (Signed)
Important Message  Patient Details  Name: [name] MRN: [id number] Date of Birth: 10/28/1945   Medicare Important Message Given:  Yes     Orbie Pyo 08/24/2018, 3:25 PM

## 2018-08-24 NOTE — Progress Notes (Signed)
Subjective: Since I last evaluated the patient, there has not been much change in her overall condition.  She has had 3 loose bowel movements per day and as per the nurse them being small in volume.  Patient has been nauseated for most of the day and is just starting to eat her dinner tonight she has a plate of food in front of her.  According to the nurse she is not eating much.  Patient denies having any melena or hematochezia. She claims she is not sure if any of the interventions are helping.  Objective: Vital signs in last 24 hours: Temp:  [98.3 F (36.8 C)-98.8 F (37.1 C)] 98.3 F (36.8 C) (07/10 0932) Pulse Rate:  [58-69] 58 (07/10 0932) Resp:  [15-18] 18 (07/10 0932) BP: (157-171)/(76-91) 157/91 (07/10 0932) SpO2:  [96 %-100 %] 99 % (07/10 0932) Weight:  [64.1 kg] 64.1 kg (07/09 2012) Last BM Date: 08/24/18  Intake/Output from previous day: 07/09 0701 - 07/10 0700 In: 3068.8 [P.O.:900; I.V.:2068.8; IV Piggyback:100] Out: 400 [Urine:400] Intake/Output this shift: Total I/O In: 240 [P.O.:240] Out: -   General appearance: alert, cooperative, appears stated age, fatigued, no distress and pale Resp: clear to auscultation bilaterally Cardio: regular rate and rhythm, S1, S2 normal, no murmur, click, rub or gallop GI: soft, non-tender; bowel sounds normal; no masses,  no organomegaly Extremities: extremities normal, atraumatic, no cyanosis or edema  Lab Results: Recent Labs    08/22/18 1117 08/23/18 0324 08/24/18 0515  WBC 7.5 7.9 9.7  HGB 11.0* 10.4* 10.9*  HCT 33.8* 30.6* 31.7*  PLT 221 199 197   BMET Recent Labs    08/23/18 0324 08/23/18 1312 08/24/18 0515  NA 142 144 146*  K 2.5* 4.7 2.8*  CL 111 111 101  CO2 20* 24 33*  GLUCOSE 140* 143* 145*  BUN 22 20 11   CREATININE 1.75* 1.67* 1.38*  CALCIUM 8.4* 8.4* 8.1*   Medications: I have reviewed the patient's current medications.  Assessment/Plan: 1) Diarrhea with anemia-patient has extensive GI work-up  with a negative panel panel by PCR colonoscopy with biopsies was done yesterday results of which are pending.  She has been started on Creon for possible EPI as she was found to have fatty appearing pancreas on recent CT scan.  She is receiving Lomotil which seems to be helping a little bit. 2) Acute kidney kidney injury from dehydration-seems to be improving.  Creatinine today is 1.38. 3) History of pulmonary embolism on chronic anticoagulation 4) Type 2 diabetes mellitus.  LOS: 3 days   Juanita Craver 08/24/2018, 3:56 PM

## 2018-08-24 NOTE — Progress Notes (Signed)
Physical Therapy Treatment Patient Details Name: [name] MRN: [id number] DOB: February 10, 1946 Today's Date: 08/24/2018    History of Present Illness 73 y.o. female with medical history significant of diabetes, hypertension, coronary artery disease, GERD, vertigo, scoliosis, hyperlipidemia, asthma and chronic anticoagulation with Xarelto. Pt admitted with 1 week of persistent diarrhea with some nausea with syncope and acute enteritis    PT Comments    Patient progressing with therapy,with more fluid transfers and tolerance to OOB mobility. She reports she is not feeling stronger today and has had diarrhea  most of the day. RN and MD aware.    Follow Up Recommendations  Home health PT     Equipment Recommendations  None recommended by PT    Recommendations for Other Services OT consult     Precautions / Restrictions Precautions Precautions: Fall Restrictions Weight Bearing Restrictions: No    Mobility  Bed Mobility Overal bed mobility: Modified Independent Bed Mobility: Supine to Sit;Sit to Supine     Supine to sit: Modified independent (Device/Increase time) Sit to supine: Modified independent (Device/Increase time)   General bed mobility comments: HOB flat without rail  Transfers Overall transfer level: Needs assistance Equipment used: Rolling walker (2 wheeled) Transfers: Sit to/from Stand Sit to Stand: Min guard         General transfer comment: min guard for safety and stability  Ambulation/Gait Ambulation/Gait assistance: Supervision Gait Distance (Feet): 50 Feet Assistive device: Rolling walker (2 wheeled) Gait Pattern/deviations: Step-through pattern;Decreased stride length Gait velocity: decreased   General Gait Details: safe with RW, no overt LOB   Stairs             Wheelchair Mobility    Modified Rankin (Stroke Patients Only)       Balance Overall balance assessment: Needs assistance Sitting-balance support: No upper  extremity supported;Feet supported Sitting balance-Leahy Scale: Good     Standing balance support: No upper extremity supported;During functional activity Standing balance-Leahy Scale: Good Standing balance comment: mingaurd for stability                             Cognition Arousal/Alertness: Awake/alert Behavior During Therapy: WFL for tasks assessed/performed Overall Cognitive Status: Within Functional Limits for tasks assessed                                        Exercises      General Comments        Pertinent Vitals/Pain Pain Assessment: Faces Faces Pain Scale: Hurts a little bit Pain Location: L arm Pain Descriptors / Indicators: Guarding Pain Intervention(s): Monitored during session;Repositioned    Home Living                      Prior Function            PT Goals (current goals can now be found in the care plan section) Acute Rehab PT Goals Patient Stated Goal: to get stronger PT Goal Formulation: With patient Time For Goal Achievement: 09/04/18 Potential to Achieve Goals: Good Progress towards PT goals: Progressing toward goals    Frequency    Min 3X/week      PT Plan Current plan remains appropriate    Co-evaluation              AM-PAC PT "6 Clicks" Mobility   Outcome Measure  Help needed turning from your back to your side while in a flat bed without using bedrails?: None Help needed moving from lying on your back to sitting on the side of a flat bed without using bedrails?: None Help needed moving to and from a bed to a chair (including a wheelchair)?: None Help needed standing up from a chair using your arms (e.g., wheelchair or bedside chair)?: None Help needed to walk in hospital room?: A Little Help needed climbing 3-5 steps with a railing? : A Little 6 Click Score: 22    End of Session Equipment Utilized During Treatment: Gait belt Activity Tolerance: Patient tolerated treatment  well Patient left: in bed;with call bell/phone within reach;with nursing/sitter in room Nurse Communication: Mobility status PT Visit Diagnosis: Other abnormalities of gait and mobility (R26.89);Muscle weakness (generalized) (M62.81)     Time: 4917-9150 PT Time Calculation (min) (ACUTE ONLY): 25 min  Charges:  $Gait Training: 8-22 mins $Therapeutic Activity: 8-22 mins                     Reinaldo Berber, PT, DPT Acute Rehabilitation Services Pager: [phone] Office: [phone]     Reinaldo Berber 08/24/2018, 1:07 PM

## 2018-08-25 DIAGNOSIS — R197 Diarrhea, unspecified: Secondary | ICD-10-CM

## 2018-08-25 DIAGNOSIS — R1110 Vomiting, unspecified: Secondary | ICD-10-CM

## 2018-08-25 LAB — BASIC METABOLIC PANEL
Anion gap: 10 (ref 5–15)
BUN: 8 mg/dL (ref 8–23)
CO2: 29 mmol/L (ref 22–32)
Calcium: 7.7 mg/dL — ABNORMAL LOW (ref 8.9–10.3)
Chloride: 103 mmol/L (ref 98–111)
Creatinine, Ser: 1.25 mg/dL — ABNORMAL HIGH (ref 0.44–1.00)
GFR calc Af Amer: 49 mL/min — ABNORMAL LOW (ref >60)
GFR calc non Af Amer: 42 mL/min — ABNORMAL LOW (ref >60)
Glucose, Bld: 98 mg/dL (ref 70–99)
Potassium: 4 mmol/L (ref 3.5–5.1)
Sodium: 142 mmol/L (ref 135–145)

## 2018-08-25 LAB — GLUCOSE, CAPILLARY
Glucose-Capillary: 99 mg/dL (ref 70–99)
Glucose-Capillary: 92 mg/dL (ref 70–99)
Glucose-Capillary: 109 mg/dL — ABNORMAL HIGH (ref 70–99)
Glucose-Capillary: 98 mg/dL (ref 70–99)
Glucose-Capillary: 96 mg/dL (ref 70–99)

## 2018-08-25 LAB — CBC
HCT: 31.1 % — ABNORMAL LOW (ref 36.0–46.0)
Hemoglobin: 10.5 g/dL — ABNORMAL LOW (ref 12.0–15.0)
MCH: 31.6 pg (ref 26.0–34.0)
MCHC: 33.8 g/dL (ref 30.0–36.0)
MCV: 93.7 fL (ref 80.0–100.0)
Platelets: 164 10*3/uL (ref 150–400)
RBC: 3.32 MIL/uL — ABNORMAL LOW (ref 3.87–5.11)
RDW: 14.3 % (ref 11.5–15.5)
WBC: 10.2 10*3/uL (ref 4.0–10.5)
nRBC: 0 % (ref 0.0–0.2)

## 2018-08-25 LAB — MAGNESIUM: Magnesium: 2.4 mg/dL (ref 1.7–2.4)

## 2018-08-25 MED ORDER — CHOLESTYRAMINE 4 G PO PACK
4.0000 g | PACK | Freq: Every day | ORAL | Status: DC
  Administered 2018-08-25 – 2018-08-29 (×4): 4 g via ORAL
  Filled 2018-08-25 (×5): qty 1

## 2018-08-25 MED ORDER — POTASSIUM CHLORIDE 2 MEQ/ML IV SOLN: INTRAVENOUS | Status: DC

## 2018-08-25 MED ORDER — LOPERAMIDE HCL 2 MG PO CAPS: 2.0000 mg | ORAL_CAPSULE | ORAL | Status: DC | PRN

## 2018-08-25 MED ORDER — LOPERAMIDE HCL 2 MG PO CAPS
4.0000 mg | ORAL_CAPSULE | Freq: Two times a day (BID) | ORAL | Status: DC
  Administered 2018-08-25 – 2018-08-26 (×3): 4 mg via ORAL
  Filled 2018-08-25 (×3): qty 2

## 2018-08-25 NOTE — Plan of Care (Signed)
  Problem: Health Behavior/Discharge Planning: Goal: Ability to manage health-related needs will improve Outcome: Progressing   

## 2018-08-25 NOTE — Progress Notes (Signed)
PROGRESS NOTE    [name]  MCN:[id number] DOB: [birth year] DOA: 08/20/2018 PCP: [name], MD    Brief Narrative:  73 year old female who presented with nausea, vomiting and diarrhea.  He does have significant past medical history for type 2 diabetes mellitus, hypertension and dyslipidemia.  She reported symptoms persistent for the last month, hospitalization June 17 to June 26 for similar symptoms.  She had extensive gastrointestinal work-up with no conclusive diagnosis.  Initial physical examination blood pressure 122/89, heart rate 86, respirate 19, oxygen saturation 100%, moist mucous membranes, lungs clear to auscultation bilaterally, heart S1-S2 present and rhythmic, abdomen soft nontender, no lower extremity edema sodium 142, potassium 4.4, chloride 118, bicarb 11, glucose 95, BUN 53, creatinine 3.89, white count 7.0, hemoglobin 12.7, hematocrit 40.8, platelets 286, SARS COVID-19 was negative, urinalysis more than 50 white cells, 11-20 red cells, specific gravity 1.017, 100 protein.  CT of the abdomen of the abdomen with fluid-filled bowel, no acute changes.  Patient was admitted to the hospital working diagnosis of acute kidney injury due to acute diarrhea.   Assessment & Plan:   Principal Problem:   Acute kidney failure (HCC) Active Problems:   Diabetes type 2, controlled ([place])   Pulmonary embolism (HCC)   Nausea vomiting and diarrhea   Acute lower UTI   Dehydration   Vomiting and diarrhea  1 hypovolemic acute kidney injury with non-anion gap metabolic acidosis Likely secondary to prerenal azotemia secondary to severe diarrhea that patient had presented with decreased oral intake.  Creatinine on admission was 3.89 from 0.82 on 08/10/2018.  Urinalysis with large leukocytes nitrite negative greater than 50 WBCs.  100 protein noted in urine.  CT abdomen and pelvis with no hydronephrosis or perinephric edema.  Unchanged cystic lesion in the right mid kidney from recent  exams.  Urinary bladder is nondistended.  No bladder wall thickening noted.  Urine output recorded of 400 cc over the past 24 hours however not accurate.  Acidosis improved with bicarb drip.  Renal function slowly trending down creatinine currently at 1.25.  Avoid nephrotoxins.  Bicarb drip has been discontinued.  Saline lock IV fluids.  Follow.  2.  Hypokalemia/hypomagnesemia. Likely secondary to GI losses.  Potassium currently at 4.  Magnesium at 2.4 after being replaced and was 1.5 yesterday.  Repeat labs in the morning.    3.  Urinary tract infection Urine cultures negative.  Patient s/p 3 days IV antibiotics.   4.  Acute on chronic diarrhea Questionable etiology.  GI pathogen panel negative. GI work-up during last hospitalization was unremarkable with GI infectious panel including C. difficile been negative.  Patient still with loose stools.  GI consulted patient seen in consultation by Dr. Collene Mares, who was recommending an unprepped colonoscopy to be done today to obtain mucosal so biopsies to rule out microscopic colitis.  Per GI patient also with fatty atrophic pancreas, exocrine pancreatic insufficiency which may be contributing to patient's diarrhea.  Patient started on pancreatic supplementation.  Patient underwent unprepped colonoscopy on 08/22/2018 per Dr. Collene Mares with biopsies taken to rule out microscopic colitis.  Patient still with loose stools.  Patient was on Lomotil as needed.  Will place on Questran daily.  Patient started on Imodium 2 tablets twice daily per GI.  GI following and appreciate input and recommendations.  5.  Pulmonary embolism Patient noted to be on Xarelto at home prior to admission.  CT chest from November 2019 with nonocclusive filling defect in the distal left main pulmonary artery.  Eliquis started at prophylactic dose of 2.5 mg twice daily.  Anticoagulation resumed after being held for unprepped colonoscopy on 08/22/2018.  No signs of bleeding.  Outpatient follow-up with  PCP.  Follow.   6.  Type 2 diabetes Hemoglobin A1c was 6.4 on 04/30/2018.  CBG of 92 this morning.  Continue sliding scale insulin.   DVT prophylaxis: Eliquis. Code Status: Full Family Communication: Updated patient.  Updated daughter on the telephone.  Disposition Plan: Home when diarrhea improved and when okay with GI.   Consultants:   Gastroenterology: Dr. Collene Mares 08/21/2018  Procedures:   CT abdomen and pelvis 08/21/2018  Unprepped colonoscopy per Dr. Collene Mares 08/22/2018  Antimicrobials:  IV Rocephin 08/21/2018>>>>>> 08/24/2018   Subjective: Patient sitting up on the side of the bed.  States she is feeling better today than she did yesterday.  Stated had a large loose stool around 6 AM this morning.  States nausea has improved.  No shortness of breath.  No chest pain.    Objective: Vitals:   08/24/18 1702 08/24/18 2214 08/25/18 0407 08/25/18 0941  BP: 139/68 128/82 (!) 151/81 (!) 149/78  Pulse: 60 63 65 64  Resp: 18 15 16 18   Temp: 98.1 F (36.7 C) 98.6 F (37 C) 98.4 F (36.9 C) 98.2 F (36.8 C)  TempSrc: Oral Oral Oral Oral  SpO2: 95% 91% 91% 94%  Weight:  63.8 kg    Height:        Intake/Output Summary (Last 24 hours) at 08/25/2018 1255 Last data filed at 08/25/2018 0900 Gross per 24 hour  Intake 1856.95 ml  Output 800 ml  Net 1056.95 ml   Filed Weights   08/21/18 0109 08/23/18 2012 08/24/18 2214  Weight: 63.5 kg 64.1 kg 63.8 kg    Examination:  General exam: NAD. Respiratory system: Clear to auscultation bilaterally.  No wheezes, no crackles, no rhonchi.  Normal respiratory effort.  Cardiovascular system: Regular rate and rhythm no murmurs rubs or gallops.  No JVD.  No lower extremity edema. Gastrointestinal system: Abdomen is soft, nontender, nondistended, positive bowel sounds.  No rebound.  No guarding.   Central nervous system: Alert and oriented. No focal neurological deficits. Extremities: Symmetric 5 x 5 power. Skin: No rashes, lesions or  ulcers Psychiatry: Judgement and insight appear normal. Mood & affect appropriate.     Data Reviewed: I have personally reviewed following labs and imaging studies  CBC: Recent Labs  Lab 08/20/18 1939 08/22/18 1117 08/23/18 0324 08/24/18 0515 08/25/18 0458  WBC 7.0 7.5 7.9 9.7 10.2  NEUTROABS  --  5.1 5.1  --   --   HGB 12.7 11.0* 10.4* 10.9* 10.5*  HCT 40.8 33.8* 30.6* 31.7* 31.1*  MCV 101.0* 97.1 91.6 91.1 93.7  PLT 286 221 199 197 333   Basic Metabolic Panel: Recent Labs  Lab 08/22/18 0520 08/22/18 1117 08/23/18 0324 08/23/18 1312 08/24/18 0515 08/25/18 0458  NA 143  --  142 144 146* 142  K 3.2*  --  2.5* 4.7 2.8* 4.0  CL 120*  --  111 111 101 103  CO2 13*  --  20* 24 33* 29  GLUCOSE 90  --  140* 143* 145* 98  BUN 39*  --  22 20 11 8   CREATININE 2.65*  --  1.75* 1.67* 1.38* 1.25*  CALCIUM 8.7*  --  8.4* 8.4* 8.1* 7.7*  MG  --  1.4* 2.5*  --  1.5* 2.4   GFR: Estimated Creatinine Clearance: 33.8 mL/min (A) (by C-G formula  based on SCr of 1.25 mg/dL (H)). Liver Function Tests: Recent Labs  Lab 08/20/18 1939  AST 24  ALT 30  ALKPHOS 69  BILITOT 1.5*  PROT 7.2  ALBUMIN 3.7   Recent Labs  Lab 08/20/18 1939  LIPASE 108*   No results for input(s): AMMONIA in the last 168 hours. Coagulation Profile: No results for input(s): INR, PROTIME in the last 168 hours. Cardiac Enzymes: No results for input(s): CKTOTAL, CKMB, CKMBINDEX, TROPONINI in the last 168 hours. BNP (last 3 results) No results for input(s): PROBNP in the last 8760 hours. HbA1C: No results for input(s): HGBA1C in the last 72 hours. CBG: Recent Labs  Lab 08/24/18 2005 08/24/18 2357 08/25/18 0409 08/25/18 0731 08/25/18 1144  GLUCAP 101* 101* 99 92 109*   Lipid Profile: No results for input(s): CHOL, HDL, LDLCALC, TRIG, CHOLHDL, LDLDIRECT in the last 72 hours. Thyroid Function Tests: No results for input(s): TSH, T4TOTAL, FREET4, T3FREE, THYROIDAB in the last 72 hours. Anemia  Panel: No results for input(s): VITAMINB12, FOLATE, FERRITIN, TIBC, IRON, RETICCTPCT in the last 72 hours. Sepsis Labs: No results for input(s): PROCALCITON, LATICACIDVEN in the last 168 hours.  Recent Results (from the past 240 hour(s))  SARS Coronavirus 2 (CEPHEID - Performed in [place] hospital lab), Hosp Order     Status: None   Collection Time: 08/21/18  1:07 AM   Specimen: Nasopharyngeal Swab  Result Value Ref Range Status   SARS Coronavirus 2 NEGATIVE NEGATIVE Final    Comment: (NOTE) If result is NEGATIVE SARS-CoV-2 target nucleic acids are NOT DETECTED. The SARS-CoV-2 RNA is generally detectable in upper and lower  respiratory specimens during the acute phase of infection. The lowest  concentration of SARS-CoV-2 viral copies this assay can detect is 250  copies / mL. A negative result does not preclude SARS-CoV-2 infection  and should not be used as the sole basis for treatment or other  patient management decisions.  A negative result may occur with  improper specimen collection / handling, submission of specimen other  than nasopharyngeal swab, presence of viral mutation(s) within the  areas targeted by this assay, and inadequate number of viral copies  (<250 copies / mL). A negative result must be combined with clinical  observations, patient history, and epidemiological information. If result is POSITIVE SARS-CoV-2 target nucleic acids are DETECTED. The SARS-CoV-2 RNA is generally detectable in upper and lower  respiratory specimens dur ing the acute phase of infection.  Positive  results are indicative of active infection with SARS-CoV-2.  Clinical  correlation with patient history and other diagnostic information is  necessary to determine patient infection status.  Positive results do  not rule out bacterial infection or co-infection with other viruses. If result is PRESUMPTIVE POSTIVE SARS-CoV-2 nucleic acids MAY BE PRESENT.   A presumptive positive result was  obtained on the submitted specimen  and confirmed on repeat testing.  While 2019 novel coronavirus  (SARS-CoV-2) nucleic acids may be present in the submitted sample  additional confirmatory testing may be necessary for epidemiological  and / or clinical management purposes  to differentiate between  SARS-CoV-2 and other Sarbecovirus currently known to infect humans.  If clinically indicated additional testing with an alternate test  methodology [phone]) is advised. The SARS-CoV-2 RNA is generally  detectable in upper and lower respiratory sp ecimens during the acute  phase of infection. The expected result is Negative. Fact Sheet for Patients:  StrictlyIdeas.no Fact Sheet for Healthcare Providers: BankingDealers.co.za This test is not  yet approved or cleared by the [place] and has been authorized for detection and/or diagnosis of SARS-CoV-2 by FDA under an Emergency Use Authorization (EUA).  This EUA will remain in effect (meaning this test can be used) for the duration of the COVID-19 declaration under Section 564(b)(1) of the Act, 21 U.S.C. section 360bbb-3(b)(1), unless the authorization is terminated or revoked sooner. Performed at [facility], [place] [address] [place], [place] [address]   Gastrointestinal Panel by PCR , Stool     Status: None   Collection Time: 08/22/18 12:55 PM   Specimen: Stool  Result Value Ref Range Status   Campylobacter species NOT DETECTED NOT DETECTED Final   Plesimonas shigelloides NOT DETECTED NOT DETECTED Final   Salmonella species NOT DETECTED NOT DETECTED Final   Yersinia enterocolitica NOT DETECTED NOT DETECTED Final   Vibrio species NOT DETECTED NOT DETECTED Final   Vibrio cholerae NOT DETECTED NOT DETECTED Final   Enteroaggregative E coli (EAEC) NOT DETECTED NOT DETECTED Final   Enteropathogenic E coli (EPEC) NOT DETECTED NOT DETECTED Final   Enterotoxigenic E coli (ETEC) NOT  DETECTED NOT DETECTED Final   Shiga like toxin producing E coli (STEC) NOT DETECTED NOT DETECTED Final   Shigella/Enteroinvasive E coli (EIEC) NOT DETECTED NOT DETECTED Final   Cryptosporidium NOT DETECTED NOT DETECTED Final   Cyclospora cayetanensis NOT DETECTED NOT DETECTED Final   Entamoeba histolytica NOT DETECTED NOT DETECTED Final   Giardia lamblia NOT DETECTED NOT DETECTED Final   Adenovirus F40/41 NOT DETECTED NOT DETECTED Final   Astrovirus NOT DETECTED NOT DETECTED Final   Norovirus GI/GII NOT DETECTED NOT DETECTED Final   Rotavirus A NOT DETECTED NOT DETECTED Final   Sapovirus (I, II, IV, and V) NOT DETECTED NOT DETECTED Final    Comment: Performed at [facility], [address] [place], [place] [address]  Culture, Urine     Status: None   Collection Time: 08/22/18  9:19 PM   Specimen: Urine, Random  Result Value Ref Range Status   Specimen Description URINE, RANDOM  Final   Special Requests NONE  Final   Culture   Final    NO GROWTH Performed at [facility], [address] [place], [place] [address]    Report Status 08/23/2018 FINAL  Final         Radiology Studies: No results found.      Scheduled Meds: . apixaban  2.5 mg Oral BID  . cholestyramine  4 g Oral Daily  . hydrALAZINE  25 mg Oral BID  . insulin aspart  0-9 Units Subcutaneous Q4H  . lipase/protease/amylase  36,000 Units Oral TID WC  . loperamide  4 mg Oral BID  . multivitamin with minerals  1 tablet Oral Daily  . nebivolol  5 mg Oral Daily  . pantoprazole  40 mg Oral Daily  . pravastatin  20 mg Oral q1800   Continuous Infusions:    LOS: 4 days    Time spent: 35 minutes    Irine Seal, MD [facility]  If 7PM-7AM, please contact night-coverage [email or web address] 08/25/2018, 12:55 PM

## 2018-08-25 NOTE — Plan of Care (Signed)
  Problem: Education: Goal: Knowledge of disease and its progression will improve Outcome: Progressing   

## 2018-08-25 NOTE — Progress Notes (Signed)
Patient continues to have explosive diarrhea. Yellow /greensih liquid stool. Patient states she does not feel good.

## 2018-08-25 NOTE — Progress Notes (Signed)
Spoke with daughter for update on patient.

## 2018-08-25 NOTE — Progress Notes (Signed)
[facility] Progress Note Covering for Dr. Collene Mares this weekend    Since last GI note: She said she is doing better but cannot really tell me in what ways. Still having multiple loose diarrhea stools daily, greenish colored.  Objective: Vital signs in last 24 hours: Temp:  [98.1 F (36.7 C)-98.6 F (37 C)] 98.2 F (36.8 C) (07/11 0941) Pulse Rate:  [60-65] 64 (07/11 0941) Resp:  [15-18] 18 (07/11 0941) BP: (128-151)/(68-82) 149/78 (07/11 0941) SpO2:  [91 %-95 %] 94 % (07/11 0941) Weight:  [63.8 kg] 63.8 kg (07/10 2214) Last BM Date: 08/25/18 General: alert and oriented times 3 Heart: regular rate and rythm Abdomen: soft, non-tender, non-distended, normal bowel sounds   Lab Results: Recent Labs    08/23/18 0324 08/24/18 0515 08/25/18 0458  WBC 7.9 9.7 10.2  HGB 10.4* 10.9* 10.5*  PLT 199 197 164  MCV 91.6 91.1 93.7   Recent Labs    08/23/18 1312 08/24/18 0515 08/25/18 0458  NA 144 146* 142  K 4.7 2.8* 4.0  CL 111 101 103  CO2 24 33* 29  GLUCOSE 143* 145* 98  BUN 20 11 8   CREATININE 1.67* 1.38* 1.25*  CALCIUM 8.4* 8.1* 7.7*     Medications: Scheduled Meds: . apixaban  2.5 mg Oral BID  . cholestyramine  4 g Oral Daily  . hydrALAZINE  25 mg Oral BID  . insulin aspart  0-9 Units Subcutaneous Q4H  . lipase/protease/amylase  36,000 Units Oral TID WC  . multivitamin with minerals  1 tablet Oral Daily  . nebivolol  5 mg Oral Daily  . pantoprazole  40 mg Oral Daily  . pravastatin  20 mg Oral q1800   Continuous Infusions: PRN Meds:.acetaminophen **OR** acetaminophen, diphenoxylate-atropine, hydrALAZINE, loperamide, ondansetron **OR** ondansetron (ZOFRAN) IV, oxymetazoline    Assessment/Plan: 73 y.o. female with significant diarrhea  Colonoscopy 2 days ago, TI was normal and random biopsies of the colon were normal. She is on Creon in case pancreatic insufficiency if playing a role. GI path panel was negative (note that this does not include C.  Diff for inpatients however I would have expected C. Diff associated diarrhea to manifest with at least some changes on the colon macroscopically, if not typical pseudomembranes).  She is on cholestyramine already, should help bile acid related diarrhea.  I am going to stop her lomotil, it is ordered PRN and I'm not sure if she is really asking for it.  Instead I will start scheduled BID imodium (two pills twice daily) to see if that helps.   Milus Banister, MD  08/25/2018, 12:09 PM  Gastroenterology Pager [phone]

## 2018-08-26 DIAGNOSIS — R112 Nausea with vomiting, unspecified: Secondary | ICD-10-CM

## 2018-08-26 LAB — GLUCOSE, CAPILLARY
Glucose-Capillary: 102 mg/dL — ABNORMAL HIGH (ref 70–99)
Glucose-Capillary: 87 mg/dL (ref 70–99)
Glucose-Capillary: 99 mg/dL (ref 70–99)
Glucose-Capillary: 99 mg/dL (ref 70–99)
Glucose-Capillary: 99 mg/dL (ref 70–99)
Glucose-Capillary: 99 mg/dL (ref 70–99)

## 2018-08-26 LAB — BASIC METABOLIC PANEL
Anion gap: 9 (ref 5–15)
BUN: 8 mg/dL (ref 8–23)
CO2: 28 mmol/L (ref 22–32)
Calcium: 8.2 mg/dL — ABNORMAL LOW (ref 8.9–10.3)
Chloride: 103 mmol/L (ref 98–111)
Creatinine, Ser: 1.19 mg/dL — ABNORMAL HIGH (ref 0.44–1.00)
GFR calc Af Amer: 52 mL/min — ABNORMAL LOW (ref >60)
GFR calc non Af Amer: 45 mL/min — ABNORMAL LOW (ref >60)
Glucose, Bld: 97 mg/dL (ref 70–99)
Potassium: 3.4 mmol/L — ABNORMAL LOW (ref 3.5–5.1)
Sodium: 140 mmol/L (ref 135–145)

## 2018-08-26 LAB — MAGNESIUM: Magnesium: 1.7 mg/dL (ref 1.7–2.4)

## 2018-08-26 MED ORDER — MAGNESIUM SULFATE 4 GM/100ML IV SOLN
4.0000 g | Freq: Once | INTRAVENOUS | Status: AC
  Administered 2018-08-26: 4 g via INTRAVENOUS
  Filled 2018-08-26: qty 100

## 2018-08-26 MED ORDER — MAGNESIUM OXIDE 400 (241.3 MG) MG PO TABS
400.0000 mg | ORAL_TABLET | Freq: Two times a day (BID) | ORAL | Status: DC
  Administered 2018-08-26 – 2018-08-27 (×2): 400 mg via ORAL
  Filled 2018-08-26 (×2): qty 1

## 2018-08-26 MED ORDER — POTASSIUM CHLORIDE 20 MEQ PO PACK
40.0000 meq | PACK | Freq: Once | ORAL | Status: AC
  Administered 2018-08-26: 40 meq via ORAL
  Filled 2018-08-26: qty 2

## 2018-08-26 MED ORDER — LOPERAMIDE HCL 2 MG PO CAPS
4.0000 mg | ORAL_CAPSULE | Freq: Three times a day (TID) | ORAL | Status: DC
  Administered 2018-08-26 – 2018-08-29 (×9): 4 mg via ORAL
  Filled 2018-08-26 (×9): qty 2

## 2018-08-26 NOTE — Progress Notes (Signed)
PROGRESS NOTE    [name]  ASN:[id number] DOB: 02-27-1945 DOA: 08/20/2018 PCP: [name], MD    Brief Narrative:  73 year old female who presented with nausea, vomiting and diarrhea.  He does have significant past medical history for type 2 diabetes mellitus, hypertension and dyslipidemia.  She reported symptoms persistent for the last month, hospitalization June 17 to June 26 for similar symptoms.  She had extensive gastrointestinal work-up with no conclusive diagnosis.  Initial physical examination blood pressure 122/89, heart rate 86, respirate 19, oxygen saturation 100%, moist mucous membranes, lungs clear to auscultation bilaterally, heart S1-S2 present and rhythmic, abdomen soft nontender, no lower extremity edema sodium 142, potassium 4.4, chloride 118, bicarb 11, glucose 95, BUN 53, creatinine 3.89, white count 7.0, hemoglobin 12.7, hematocrit 40.8, platelets 286, SARS COVID-19 was negative, urinalysis more than 50 white cells, 11-20 red cells, specific gravity 1.017, 100 protein.  CT of the abdomen of the abdomen with fluid-filled bowel, no acute changes.  Patient was admitted to the hospital working diagnosis of acute kidney injury due to acute diarrhea.   Assessment & Plan:   Principal Problem:   Acute kidney failure (HCC) Active Problems:   Diabetes type 2, controlled ([place])   Pulmonary embolism (HCC)   Nausea vomiting and diarrhea   Acute lower UTI   Dehydration   Vomiting and diarrhea  1 hypovolemic acute kidney injury with non-anion gap metabolic acidosis Likely secondary to prerenal azotemia secondary to severe diarrhea that patient had presented with decreased oral intake.  Creatinine on admission was 3.89 from 0.82 on 08/10/2018.  Urinalysis with large leukocytes nitrite negative greater than 50 WBCs.  100 protein noted in urine.  CT abdomen and pelvis with no hydronephrosis or perinephric edema.  Unchanged cystic lesion in the right mid kidney from recent  exams.  Urinary bladder is nondistended.  No bladder wall thickening noted.  Urine output recorded of 700 cc over the past 24 hours however not accurate.  Acidosis improved with bicarb drip.  Renal function slowly trending down creatinine currently at 1.19.  Avoid nephrotoxins.  Bicarb drip has been discontinued.  Saline lock IV fluids.  Follow.  2.  Hypokalemia/hypomagnesemia. Likely secondary to GI losses.  Potassium currently at 3.4.  Magnesium at 1.7.  We will give magnesium sulfate 4 g IV x1.  Oral potassium supplementation.  Will place on oral magnesium twice daily.  Repeat labs in the morning.   3.  Urinary tract infection versus bacteriuria Urine cultures negative.  Patient s/p 3 days IV antibiotics.   4.  Acute on chronic diarrhea Questionable etiology.  GI pathogen panel negative. GI work-up during last hospitalization was unremarkable with GI infectious panel including C. difficile been negative.  Patient still with loose stools.  GI consulted patient seen in consultation by Dr. Collene Mares, who was recommending an unprepped colonoscopy to be done today to obtain mucosal so biopsies to rule out microscopic colitis.  Per GI patient also with fatty atrophic pancreas, exocrine pancreatic insufficiency which may be contributing to patient's diarrhea.  Patient started on pancreatic supplementation.  Patient underwent unprepped colonoscopy on 08/22/2018 per Dr. Collene Mares with biopsies taken to rule out microscopic colitis.  Patient still with loose stools stating she had a couple overnight.  Patient with 1 loose stool this morning.  Continue Questran.  Patient started on scheduled Imodium per GI and dose has been increased to 3 times daily per GI. GI following and appreciate input and recommendations.  5.  Pulmonary embolism Patient  noted to be on Xarelto at home prior to admission.  CT chest from November 2019 with nonocclusive filling defect in the distal left main pulmonary artery.  Eliquis started at  prophylactic dose of 2.5 mg twice daily.  Anticoagulation resumed after being held for unprepped colonoscopy on 08/22/2018.  No signs of bleeding.  Outpatient follow-up with PCP.  Follow.   6.  Type 2 diabetes Hemoglobin A1c was 6.4 on 04/30/2018.  CBG of 99 this morning.  Per nurse tech patient eating approximately 25% of her meals.  Continue sliding scale insulin.   DVT prophylaxis: Eliquis. Code Status: Full Family Communication: Updated patient.  Disposition Plan: Home when diarrhea improved and when okay with GI.   Consultants:   Gastroenterology: Dr. Collene Mares 08/21/2018  Procedures:   CT abdomen and pelvis 08/21/2018  Unprepped colonoscopy per Dr. Collene Mares 08/22/2018  Antimicrobials:  IV Rocephin 08/21/2018>>>>>> 08/24/2018   Subjective: Patient sitting up at the side of the bed.  Patient noted to have eating only 25% of her meals per nurse tech.  Patient noted to have 1 watery loose stool today.  Patient states that couple of loose watery stools overnight.  Denies any chest pain or shortness of breath.  States nausea is improving.  No emesis.    Objective: Vitals:   08/25/18 1616 08/25/18 2016 08/26/18 0432 08/26/18 0908  BP: (!) 142/76 (!) 141/79 (!) 145/79 (!) 163/85  Pulse: (!) 58 (!) 59 60 62  Resp: 18 15 17 18   Temp: 98.4 F (36.9 C) 98.5 F (36.9 C) 98.7 F (37.1 C) 98.1 F (36.7 C)  TempSrc: Oral Oral Oral Oral  SpO2: 94% 96% 98% 97%  Weight:  63.7 kg    Height:        Intake/Output Summary (Last 24 hours) at 08/26/2018 1241 Last data filed at 08/26/2018 0908 Gross per 24 hour  Intake 480 ml  Output 800 ml  Net -320 ml   Filed Weights   08/23/18 2012 08/24/18 2214 08/25/18 2016  Weight: 64.1 kg 63.8 kg 63.7 kg    Examination:  General exam: NAD. Respiratory system: CTA B.  No wheezes, no crackles, no rhonchi.  Normal respiratory effort.   Cardiovascular system: RRR no murmurs rubs or gallops.  No JVD.  No lower extremity edema.  Gastrointestinal system: Abdomen  is soft, nontender, nondistended, positive bowel sounds.  No rebound.  No guarding.   Central nervous system: Alert and oriented. No focal neurological deficits. Extremities: Symmetric 5 x 5 power. Skin: No rashes, lesions or ulcers Psychiatry: Judgement and insight appear normal. Mood & affect appropriate.     Data Reviewed: I have personally reviewed following labs and imaging studies  CBC: Recent Labs  Lab 08/20/18 1939 08/22/18 1117 08/23/18 0324 08/24/18 0515 08/25/18 0458  WBC 7.0 7.5 7.9 9.7 10.2  NEUTROABS  --  5.1 5.1  --   --   HGB 12.7 11.0* 10.4* 10.9* 10.5*  HCT 40.8 33.8* 30.6* 31.7* 31.1*  MCV 101.0* 97.1 91.6 91.1 93.7  PLT 286 221 199 197 371   Basic Metabolic Panel: Recent Labs  Lab 08/22/18 1117 08/23/18 0324 08/23/18 1312 08/24/18 0515 08/25/18 0458 08/26/18 0307  NA  --  142 144 146* 142 140  K  --  2.5* 4.7 2.8* 4.0 3.4*  CL  --  111 111 101 103 103  CO2  --  20* 24 33* 29 28  GLUCOSE  --  140* 143* 145* 98 97  BUN  --  22  20 11 8 8   CREATININE  --  1.75* 1.67* 1.38* 1.25* 1.19*  CALCIUM  --  8.4* 8.4* 8.1* 7.7* 8.2*  MG 1.4* 2.5*  --  1.5* 2.4 1.7   GFR: Estimated Creatinine Clearance: 35.5 mL/min (A) (by C-G formula based on SCr of 1.19 mg/dL (H)). Liver Function Tests: Recent Labs  Lab 08/20/18 1939  AST 24  ALT 30  ALKPHOS 69  BILITOT 1.5*  PROT 7.2  ALBUMIN 3.7   Recent Labs  Lab 08/20/18 1939  LIPASE 108*   No results for input(s): AMMONIA in the last 168 hours. Coagulation Profile: No results for input(s): INR, PROTIME in the last 168 hours. Cardiac Enzymes: No results for input(s): CKTOTAL, CKMB, CKMBINDEX, TROPONINI in the last 168 hours. BNP (last 3 results) No results for input(s): PROBNP in the last 8760 hours. HbA1C: No results for input(s): HGBA1C in the last 72 hours. CBG: Recent Labs  Lab 08/25/18 2016 08/26/18 0003 08/26/18 0433 08/26/18 0732 08/26/18 1133  GLUCAP 96 99 99 102* 99   Lipid  Profile: No results for input(s): CHOL, HDL, LDLCALC, TRIG, CHOLHDL, LDLDIRECT in the last 72 hours. Thyroid Function Tests: No results for input(s): TSH, T4TOTAL, FREET4, T3FREE, THYROIDAB in the last 72 hours. Anemia Panel: No results for input(s): VITAMINB12, FOLATE, FERRITIN, TIBC, IRON, RETICCTPCT in the last 72 hours. Sepsis Labs: No results for input(s): PROCALCITON, LATICACIDVEN in the last 168 hours.  Recent Results (from the past 240 hour(s))  SARS Coronavirus 2 (CEPHEID - Performed in [facility] hospital lab), Hosp Order     Status: None   Collection Time: 08/21/18  1:07 AM   Specimen: Nasopharyngeal Swab  Result Value Ref Range Status   SARS Coronavirus 2 NEGATIVE NEGATIVE Final    Comment: (NOTE) If result is NEGATIVE SARS-CoV-2 target nucleic acids are NOT DETECTED. The SARS-CoV-2 RNA is generally detectable in upper and lower  respiratory specimens during the acute phase of infection. The lowest  concentration of SARS-CoV-2 viral copies this assay can detect is 250  copies / mL. A negative result does not preclude SARS-CoV-2 infection  and should not be used as the sole basis for treatment or other  patient management decisions.  A negative result may occur with  improper specimen collection / handling, submission of specimen other  than nasopharyngeal swab, presence of viral mutation(s) within the  areas targeted by this assay, and inadequate number of viral copies  (<250 copies / mL). A negative result must be combined with clinical  observations, patient history, and epidemiological information. If result is POSITIVE SARS-CoV-2 target nucleic acids are DETECTED. The SARS-CoV-2 RNA is generally detectable in upper and lower  respiratory specimens dur ing the acute phase of infection.  Positive  results are indicative of active infection with SARS-CoV-2.  Clinical  correlation with patient history and other diagnostic information is  necessary to determine patient  infection status.  Positive results do  not rule out bacterial infection or co-infection with other viruses. If result is PRESUMPTIVE POSTIVE SARS-CoV-2 nucleic acids MAY BE PRESENT.   A presumptive positive result was obtained on the submitted specimen  and confirmed on repeat testing.  While 2019 novel coronavirus  (SARS-CoV-2) nucleic acids may be present in the submitted sample  additional confirmatory testing may be necessary for epidemiological  and / or clinical management purposes  to differentiate between  SARS-CoV-2 and other Sarbecovirus currently known to infect humans.  If clinically indicated additional testing with an alternate test  methodology [id number]) is advised. The SARS-CoV-2 RNA is generally  detectable in upper and lower respiratory sp ecimens during the acute  phase of infection. The expected result is Negative. Fact Sheet for Patients:  StrictlyIdeas.no Fact Sheet for Healthcare Providers: BankingDealers.co.za This test is not yet approved or cleared by the [place] FDA and has been authorized for detection and/or diagnosis of SARS-CoV-2 by FDA under an Emergency Use Authorization (EUA).  This EUA will remain in effect (meaning this test can be used) for the duration of the COVID-19 declaration under Section 564(b)(1) of the Act, 21 U.S.C. section 360bbb-3(b)(1), unless the authorization is terminated or revoked sooner. Performed at [facility], [address], [place], [place] [address]   Gastrointestinal Panel by PCR , Stool     Status: None   Collection Time: 08/22/18 12:55 PM   Specimen: Stool  Result Value Ref Range Status   Campylobacter species NOT DETECTED NOT DETECTED Final   Plesimonas shigelloides NOT DETECTED NOT DETECTED Final   Salmonella species NOT DETECTED NOT DETECTED Final   Yersinia enterocolitica NOT DETECTED NOT DETECTED Final   Vibrio species NOT DETECTED NOT DETECTED Final    Vibrio cholerae NOT DETECTED NOT DETECTED Final   Enteroaggregative E coli (EAEC) NOT DETECTED NOT DETECTED Final   Enteropathogenic E coli (EPEC) NOT DETECTED NOT DETECTED Final   Enterotoxigenic E coli (ETEC) NOT DETECTED NOT DETECTED Final   Shiga like toxin producing E coli (STEC) NOT DETECTED NOT DETECTED Final   Shigella/Enteroinvasive E coli (EIEC) NOT DETECTED NOT DETECTED Final   Cryptosporidium NOT DETECTED NOT DETECTED Final   Cyclospora cayetanensis NOT DETECTED NOT DETECTED Final   Entamoeba histolytica NOT DETECTED NOT DETECTED Final   Giardia lamblia NOT DETECTED NOT DETECTED Final   Adenovirus F40/41 NOT DETECTED NOT DETECTED Final   Astrovirus NOT DETECTED NOT DETECTED Final   Norovirus GI/GII NOT DETECTED NOT DETECTED Final   Rotavirus A NOT DETECTED NOT DETECTED Final   Sapovirus (I, II, IV, and V) NOT DETECTED NOT DETECTED Final    Comment: Performed at [facility], [address] [place], [place] [address]  Culture, Urine     Status: None   Collection Time: 08/22/18  9:19 PM   Specimen: Urine, Random  Result Value Ref Range Status   Specimen Description URINE, RANDOM  Final   Special Requests NONE  Final   Culture   Final    NO GROWTH Performed at [facility], [address], [place], [place] [address]    Report Status 08/23/2018 FINAL  Final         Radiology Studies: No results found.      Scheduled Meds: . apixaban  2.5 mg Oral BID  . cholestyramine  4 g Oral Daily  . hydrALAZINE  25 mg Oral BID  . insulin aspart  0-9 Units Subcutaneous Q4H  . lipase/protease/amylase  36,000 Units Oral TID WC  . loperamide  4 mg Oral TID  . multivitamin with minerals  1 tablet Oral Daily  . nebivolol  5 mg Oral Daily  . pantoprazole  40 mg Oral Daily  . pravastatin  20 mg Oral q1800   Continuous Infusions: . magnesium sulfate bolus IVPB 4 g (08/26/18 1149)     LOS: 5 days    Time spent: 35 minutes    Irine Seal,  MD [facility]  If 7PM-7AM, please contact night-coverage [email or web address] 08/26/2018, 12:41 PM

## 2018-08-26 NOTE — Progress Notes (Signed)
[facility] Progress Note Covering for Dr. Collene Mares    Since last GI note: I changed her anti diarrheals a bit yesterday (started imodium 4mg  BID on a scheduled basis).  SHe told me she had a lot of diarrhea overnight and also this morning. Her day RN tells me she's had NO diarrhea this morning but did have some overnight.  Objective: Vital signs in last 24 hours: Temp:  [98.1 F (36.7 C)-98.7 F (37.1 C)] 98.1 F (36.7 C) (07/12 0908) Pulse Rate:  [58-62] 62 (07/12 0908) Resp:  [15-18] 18 (07/12 0908) BP: (141-163)/(76-85) 163/85 (07/12 0908) SpO2:  [94 %-98 %] 97 % (07/12 0908) Weight:  [63.7 kg] 63.7 kg (07/11 2016) Last BM Date: 08/25/18 General: alert and oriented times 3 Heart: regular rate and rythm Abdomen: soft, non-tender, non-distended, normal bowel sounds   Lab Results: Recent Labs    08/24/18 0515 08/25/18 0458  WBC 9.7 10.2  HGB 10.9* 10.5*  PLT 197 164  MCV 91.1 93.7   Recent Labs    08/24/18 0515 08/25/18 0458 08/26/18 0307  NA 146* 142 140  K 2.8* 4.0 3.4*  CL 101 103 103  CO2 33* 29 28  GLUCOSE 145* 98 97  BUN 11 8 8   CREATININE 1.38* 1.25* 1.19*  CALCIUM 8.1* 7.7* 8.2*     Medications: Scheduled Meds: . apixaban  2.5 mg Oral BID  . cholestyramine  4 g Oral Daily  . hydrALAZINE  25 mg Oral BID  . insulin aspart  0-9 Units Subcutaneous Q4H  . lipase/protease/amylase  36,000 Units Oral TID WC  . loperamide  4 mg Oral BID  . multivitamin with minerals  1 tablet Oral Daily  . nebivolol  5 mg Oral Daily  . pantoprazole  40 mg Oral Daily  . pravastatin  20 mg Oral q1800   Continuous Infusions: . magnesium sulfate bolus IVPB     PRN Meds:.acetaminophen **OR** acetaminophen, hydrALAZINE, loperamide, ondansetron **OR** ondansetron (ZOFRAN) IV, oxymetazoline    Assessment/Plan: 73 y.o. female with significant diarrhea  Colonoscopy 2 days ago, TI was normal and random biopsies of the colon were normal. She is on Creon in case  pancreatic insufficiency if playing a role. GI path panel was negative (note that this does not include C. Diff for inpatients however I would have expected C. Diff associated diarrhea to manifest with at least some changes on the colon macroscopically, if not typical pseudomembranes).  She is on cholestyramine already, should help bile acid related diarrhea.    Yesterday I stopped her lomotil and started 4mg  BID scheduled imodium. I am going to increase the imodium to TID scheduled now and I've asked the nurses to chart all her stools (with estimated volume) in Epic as I am increasing losing confidence in her ability to provide an accurate history.    Milus Banister, MD  08/26/2018, 10:55 AM [facility] Pager [phone]

## 2018-08-26 NOTE — Discharge Instructions (Signed)
Information on my medicine - ELIQUIS (apixaban)  This medication education was reviewed with me or my healthcare representative as part of my discharge preparation.   Why was Eliquis prescribed for you?  -   Eliquis was prescribed for your history of pulmonary embolism (PE) and to reduce the risk of blood clots occurring again.  What do You need to know about Eliquis ? The dose is one 2.5 mg tablet taken TWICE daily (unless you physician changes your dose and instructs you otherwise.  Eliquis may be taken with or without food.   Try to take the dose about the same time in the morning and in the evening. If you have difficulty swallowing the tablet whole please discuss with your pharmacist how to take the medication safely.  Take Eliquis exactly as prescribed and DO NOT stop taking Eliquis without talking to the doctor who prescribed the medication.  Stopping may increase your risk of developing a new blood clot.  Refill your prescription before you run out.  After discharge, you should have regular check-up appointments with your healthcare provider that is prescribing your Eliquis.    What do you do if you miss a dose? If a dose of ELIQUIS is not taken at the scheduled time, take it as soon as possible on the same day and twice-daily administration should be resumed. The dose should not be doubled to make up for a missed dose.  Important Safety Information A possible side effect of Eliquis is bleeding. You should call your healthcare provider right away if you experience any of the following: ? Bleeding from an injury or your nose that does not stop. ? Unusual colored urine (red or dark brown) or unusual colored stools (red or black). ? Unusual bruising for unknown reasons. ? A serious fall or if you hit your head (even if there is no bleeding).  Some medicines may interact with Eliquis and might increase your risk of bleeding or clotting while on Eliquis. To help avoid this,  consult your healthcare provider or pharmacist prior to using any new prescription or non-prescription medications, including herbals, vitamins, non-steroidal anti-inflammatory drugs (NSAIDs) and supplements.  This website has more information on Eliquis (apixaban): http://www.eliquis.com/eliquis/home

## 2018-08-26 NOTE — Plan of Care (Signed)
  Problem: Education: Goal: Knowledge of disease and its progression will improve Outcome: Completed/Met   Problem: Health Behavior/Discharge Planning: Goal: Ability to manage health-related needs will improve Outcome: Completed/Met

## 2018-08-27 DIAGNOSIS — E876 Hypokalemia: Secondary | ICD-10-CM

## 2018-08-27 LAB — GLUCOSE, CAPILLARY
Glucose-Capillary: 106 mg/dL — ABNORMAL HIGH (ref 70–99)
Glucose-Capillary: 109 mg/dL — ABNORMAL HIGH (ref 70–99)
Glucose-Capillary: 82 mg/dL (ref 70–99)
Glucose-Capillary: 83 mg/dL (ref 70–99)
Glucose-Capillary: 92 mg/dL (ref 70–99)
Glucose-Capillary: 95 mg/dL (ref 70–99)

## 2018-08-27 LAB — MAGNESIUM: Magnesium: 2.3 mg/dL (ref 1.7–2.4)

## 2018-08-27 LAB — BASIC METABOLIC PANEL
Anion gap: 10 (ref 5–15)
BUN: 7 mg/dL — ABNORMAL LOW (ref 8–23)
CO2: 26 mmol/L (ref 22–32)
Calcium: 8.5 mg/dL — ABNORMAL LOW (ref 8.9–10.3)
Chloride: 104 mmol/L (ref 98–111)
Creatinine, Ser: 1.05 mg/dL — ABNORMAL HIGH (ref 0.44–1.00)
GFR calc Af Amer: >60 mL/min (ref >60)
GFR calc non Af Amer: 52 mL/min — ABNORMAL LOW (ref >60)
Glucose, Bld: 104 mg/dL — ABNORMAL HIGH (ref 70–99)
Potassium: 3.7 mmol/L (ref 3.5–5.1)
Sodium: 140 mmol/L (ref 135–145)

## 2018-08-27 MED ORDER — POTASSIUM CHLORIDE 20 MEQ/15ML (10%) PO SOLN
40.0000 meq | Freq: Once | ORAL | Status: AC
  Administered 2018-08-27: 40 meq via ORAL
  Filled 2018-08-27: qty 30

## 2018-08-27 MED ORDER — HYDRALAZINE HCL 50 MG PO TABS
50.0000 mg | ORAL_TABLET | Freq: Two times a day (BID) | ORAL | Status: DC
  Administered 2018-08-27: 50 mg via ORAL
  Filled 2018-08-27: qty 1

## 2018-08-27 MED ORDER — OCTREOTIDE ACETATE 100 MCG/ML IJ SOLN
100.0000 ug | Freq: Three times a day (TID) | INTRAMUSCULAR | Status: DC
  Administered 2018-08-27 – 2018-08-29 (×5): 100 ug via SUBCUTANEOUS
  Filled 2018-08-27 (×10): qty 1

## 2018-08-27 MED ORDER — HYDRALAZINE HCL 50 MG PO TABS
75.0000 mg | ORAL_TABLET | Freq: Two times a day (BID) | ORAL | Status: DC
  Administered 2018-08-27 – 2018-08-29 (×4): 75 mg via ORAL
  Filled 2018-08-27 (×4): qty 1

## 2018-08-27 NOTE — Progress Notes (Signed)
Occupational Therapy Treatment Patient Details Name: [name] MRN: [id number] DOB: Sep 25, 1945 Today's Date: 08/27/2018    History of present illness 73 y.o. female with medical history significant of diabetes, hypertension, coronary artery disease, GERD, vertigo, scoliosis, hyperlipidemia, asthma and chronic anticoagulation with Xarelto. Pt admitted with 1 week of persistent diarrhea with some nausea with syncope and acute enteritis   OT comments  Pt stating her daughter wants more assistance in pt's home as she is concerned about pt's cognition. Pt performed all ADL this date with set to supervision. She touched furniture and door frames as she walked about her room and bathroom, but reports she has a rollator and is borrowing a shower seat should she need it. Updated d/c recommendation to [facility] to address safety with IADL once pt returns home. Pt educated to use lists, calendar and pill box as needed and to have someone in her home when she showers. Pt verbalizing understanding.  Follow Up Recommendations  Home health OT    Equipment Recommendations  None recommended by OT    Recommendations for Other Services      Precautions / Restrictions Precautions Precautions: Fall       Mobility Bed Mobility               General bed mobility comments: pt seated at EOB upon arrival, left pt up in chair  Transfers Overall transfer level: Modified independent               General transfer comment: from bed and 3 in 1    Balance Overall balance assessment: Needs assistance   Sitting balance-Leahy Scale: Good       Standing balance-Leahy Scale: Good                             ADL either performed or assessed with clinical judgement   ADL Overall ADL's : Needs assistance/impaired     Grooming: Wash/dry hands;Standing;Supervision/safety                   Toilet Transfer: Supervision/safety;Ambulation   Toileting- Clothing Manipulation  and Hygiene: Supervision/safety;Sit to/from stand       Functional mobility during ADLs: Supervision/safety       Vision       Perception     Praxis      Cognition Arousal/Alertness: Awake/alert Behavior During Therapy: WFL for tasks assessed/performed Overall Cognitive Status: Within Functional Limits for tasks assessed                                          Exercises     Shoulder Instructions       General Comments      Pertinent Vitals/ Pain       Pain Assessment: No/denies pain  Home Living                                          Prior Functioning/Environment              Frequency  Min 2X/week        Progress Toward Goals  OT Goals(current goals can now be found in the care plan section)  Progress towards OT goals: Progressing toward goals  Acute Rehab OT  Goals Patient Stated Goal: to get stronger OT Goal Formulation: With patient Time For Goal Achievement: 09/06/18 Potential to Achieve Goals: Good  Plan Discharge plan remains appropriate;Frequency remains appropriate    Co-evaluation                 AM-PAC OT "6 Clicks" Daily Activity     Outcome Measure   Help from another person eating meals?: None Help from another person taking care of personal grooming?: None Help from another person toileting, which includes using toliet, bedpan, or urinal?: A Little Help from another person bathing (including washing, rinsing, drying)?: A Little Help from another person to put on and taking off regular upper body clothing?: None Help from another person to put on and taking off regular lower body clothing?: A Little 6 Click Score: 21    End of Session Equipment Utilized During Treatment: Gait belt  OT Visit Diagnosis: Unsteadiness on feet (R26.81);Other abnormalities of gait and mobility (R26.89);Muscle weakness (generalized) (M62.81)   Activity Tolerance Patient tolerated treatment well    Patient Left in chair;with call bell/phone within reach   Nurse Communication          Time: 2505-3976 OT Time Calculation (min): 30 min  Charges: OT General Charges $OT Visit: 1 Visit OT Treatments $Self Care/Home Management : 23-37 mins  Nestor Lewandowsky, OTR/L Acute Rehabilitation Services Pager: [phone] Office: [phone]   Malka So 08/27/2018, 9:21 AM

## 2018-08-27 NOTE — Plan of Care (Signed)
  Problem: Activity: Goal: Activity intolerance will improve Outcome: Progressing   Problem: Health Behavior/Discharge Planning: Goal: Ability to manage health-related needs will improve Outcome: Progressing   Problem: Clinical Measurements: Goal: Diagnostic test results will improve Outcome: Progressing

## 2018-08-27 NOTE — Progress Notes (Signed)
Physical Therapy Treatment Patient Details Name: [name] MRN: [id number] DOB: 1946/01/10 Today's Date: 08/27/2018    History of Present Illness 73 y.o. female with medical history significant of diabetes, hypertension, coronary artery disease, GERD, vertigo, scoliosis, hyperlipidemia, asthma and chronic anticoagulation with Xarelto. Pt admitted with 1 week of persistent diarrhea with some nausea with syncope and acute enteritis    PT Comments    Patient progressing with ambulation distance, stability as ambulating without device and with strength performing LE therex.  Patient remains appropriate for home with HHPT and no further equipment needs.  She has rollator and shower seat.  Discussed grabbar options if needed.  Will follow up during acute stay if not d/c.   Follow Up Recommendations  Home health PT     Equipment Recommendations  None recommended by PT    Recommendations for Other Services       Precautions / Restrictions Precautions Precautions: Fall Restrictions Weight Bearing Restrictions: No    Mobility  Bed Mobility Overal bed mobility: Modified Independent       Supine to sit: Modified independent (Device/Increase time) Sit to supine: Modified independent (Device/Increase time)      Transfers Overall transfer level: Modified independent Equipment used: None Transfers: Sit to/from Stand Sit to Stand: Modified independent (Device/Increase time)         General transfer comment: from bed  Ambulation/Gait Ambulation/Gait assistance: Supervision Gait Distance (Feet): 130 Feet Assistive device: None Gait Pattern/deviations: Step-through pattern;Decreased stride length     General Gait Details: no AD and no LOB, but keeping hands up in high guard position   Stairs             Wheelchair Mobility    Modified Rankin (Stroke Patients Only)       Balance     Sitting balance-Leahy Scale: Good     Standing balance support: No  upper extremity supported;During functional activity Standing balance-Leahy Scale: Good                              Cognition Arousal/Alertness: Awake/alert Behavior During Therapy: WFL for tasks assessed/performed Overall Cognitive Status: Within Functional Limits for tasks assessed                                        Exercises Other Exercises Other Exercises: performed sit<>stand x 5 no UE support Other Exercises: seated and standing marches x 5 Other Exercises: standing hip abduction x 5 with UE support Other Exercises: heel raises x 5 with UE support    General Comments        Pertinent Vitals/Pain Pain Assessment: No/denies pain    Home Living                      Prior Function            PT Goals (current goals can now be found in the care plan section) Progress towards PT goals: Progressing toward goals    Frequency    Min 3X/week      PT Plan Current plan remains appropriate    Co-evaluation              AM-PAC PT "6 Clicks" Mobility   Outcome Measure  Help needed turning from your back to your side while in a flat bed without using bedrails?:  None Help needed moving from lying on your back to sitting on the side of a flat bed without using bedrails?: None Help needed moving to and from a bed to a chair (including a wheelchair)?: None Help needed standing up from a chair using your arms (e.g., wheelchair or bedside chair)?: None Help needed to walk in hospital room?: None Help needed climbing 3-5 steps with a railing? : A Little 6 Click Score: 23    End of Session Equipment Utilized During Treatment: Gait belt Activity Tolerance: Patient tolerated treatment well Patient left: in bed;with call bell/phone within reach   PT Visit Diagnosis: Other abnormalities of gait and mobility (R26.89);Muscle weakness (generalized) (M62.81)     Time: 9747-1855 PT Time Calculation (min) (ACUTE ONLY): 23  min  Charges:  $Gait Training: 8-22 mins $Therapeutic Exercise: 8-22 mins                     [name], [facility] [phone] 08/27/2018    Reginia Naas 08/27/2018, 4:52 PM

## 2018-08-27 NOTE — Progress Notes (Signed)
PROGRESS NOTE    [name]  FTD:[id number] DOB: 08/24/1945 DOA: 08/20/2018 PCP: [name], MD    Brief Narrative:  73 year old female who presented with nausea, vomiting and diarrhea.  He does have significant past medical history for type 2 diabetes mellitus, hypertension and dyslipidemia.  She reported symptoms persistent for the last month, hospitalization June 17 to June 26 for similar symptoms.  She had extensive gastrointestinal work-up with no conclusive diagnosis.  Initial physical examination blood pressure 122/89, heart rate 86, respirate 19, oxygen saturation 100%, moist mucous membranes, lungs clear to auscultation bilaterally, heart S1-S2 present and rhythmic, abdomen soft nontender, no lower extremity edema sodium 142, potassium 4.4, chloride 118, bicarb 11, glucose 95, BUN 53, creatinine 3.89, white count 7.0, hemoglobin 12.7, hematocrit 40.8, platelets 286, SARS COVID-19 was negative, urinalysis more than 50 white cells, 11-20 red cells, specific gravity 1.017, 100 protein.  CT of the abdomen of the abdomen with fluid-filled bowel, no acute changes.  Patient was admitted to the hospital working diagnosis of acute kidney injury due to acute diarrhea.   Assessment & Plan:   Principal Problem:   Acute kidney failure (HCC) Active Problems:   Diabetes type 2, controlled ([place])   Pulmonary embolism (HCC)   Nausea vomiting and diarrhea   Acute lower UTI   Dehydration   Vomiting and diarrhea  1 hypovolemic acute kidney injury with non-anion gap metabolic acidosis Likely secondary to prerenal azotemia secondary to severe diarrhea that patient had presented with decreased oral intake.  Creatinine on admission was 3.89 from 0.82 on 08/10/2018.  Urinalysis with large leukocytes nitrite negative greater than 50 WBCs.  100 protein noted in urine.  CT abdomen and pelvis with no hydronephrosis or perinephric edema.  Unchanged cystic lesion in the right mid kidney from recent  exams.  Urinary bladder is nondistended.  No bladder wall thickening noted.  Urine output recorded of 700 cc over the past 24 hours however not accurate.  Acidosis improved with bicarb drip.  Renal function slowly trending down creatinine currently at 1.05.Marland Kitchen  Avoid nephrotoxins.  Bicarb drip has been discontinued.  Saline lock IV fluids.  Follow.  2.  Hypokalemia/hypomagnesemia. Likely secondary to GI losses.  Potassium currently at 3.7.  Magnesium at 2.3.  Continue oral magnesium supplementation.  Repeat labs in the morning.   3.  Urinary tract infection versus bacteriuria Urine cultures negative.  Patient s/p 3 days IV antibiotics.  No further antibiotics needed.  4.  Acute on chronic diarrhea Questionable etiology.  GI pathogen panel negative. GI work-up during last hospitalization was unremarkable with GI infectious panel including C. difficile been negative.  Patient with decreasing frequency of loose stools however no improvement with consistency per patient.  GI consulted patient seen in consultation by Dr. Collene Mares, who was recommending an unprepped colonoscopy to be done today to obtain mucosal so biopsies to rule out microscopic colitis.  Per GI patient also with fatty atrophic pancreas, exocrine pancreatic insufficiency which may be contributing to patient's diarrhea.  Patient started on pancreatic supplementation.  Patient underwent unprepped colonoscopy on 08/22/2018 per Dr. Collene Mares with biopsies taken to rule out microscopic colitis.  Biopsies negative.  Patient started on Questran which we will continue.  Patient placed on scheduled Imodium per GI and dose was increased to 3 times daily on 08/26/2018.  Per GI.   5.  Pulmonary embolism Patient noted to be on Xarelto at home prior to admission.  CT chest from November 2019 with nonocclusive filling  defect in the distal left main pulmonary artery.  Eliquis started at prophylactic dose of 2.5 mg twice daily.  Anticoagulation resumed after being held for  unprepped colonoscopy on 08/22/2018.  No signs of bleeding.  Outpatient follow-up with PCP.  Follow.   6.  Type 2 diabetes Hemoglobin A1c was 6.4 on 04/30/2018.  CBG of 92 this morning.  Per nurse tech patient eating approximately 25% of her meals.  Continue sliding scale insulin.   DVT prophylaxis: Eliquis. Code Status: Full Family Communication: Updated patient.  Disposition Plan: Home when diarrhea improved and when okay with GI.   Consultants:   Gastroenterology: Dr. Collene Mares 08/21/2018  Procedures:   CT abdomen and pelvis 08/21/2018  Unprepped colonoscopy per Dr. Collene Mares 08/22/2018  Antimicrobials:  IV Rocephin 08/21/2018>>>>>> 08/24/2018   Subjective: Patient sitting up.  States she is feeling a little bit better today than yesterday.  Patient noted to have 1 loose stool this morning.  Patient noted to have 1-2 loose stools yesterday.  Denies any chest pain or shortness of breath.  Nausea improving.  No emesis.   Objective: Vitals:   08/26/18 1621 08/26/18 2018 08/27/18 0425 08/27/18 0818  BP: (!) 159/76 (!) 155/70 (!) 152/78 (!) 157/75  Pulse: (!) 55 60 (!) 56 (!) 57  Resp: 16 18 18 18   Temp: 97.6 F (36.4 C) 98.6 F (37 C) 98.4 F (36.9 C) 98.3 F (36.8 C)  TempSrc: Oral Oral Oral Oral  SpO2: 100% 97% 98% 99%  Weight:  63.7 kg    Height:        Intake/Output Summary (Last 24 hours) at 08/27/2018 1206 Last data filed at 08/27/2018 2426 Gross per 24 hour  Intake 208.1 ml  Output 200 ml  Net 8.1 ml   Filed Weights   08/24/18 2214 08/25/18 2016 08/26/18 2018  Weight: 63.8 kg 63.7 kg 63.7 kg    Examination:  General exam: NAD. Respiratory system: Lungs clear to auscultation bilaterally.  No wheezes, no crackles, no rhonchi.  Cardiovascular system: Regular rate rhythm no murmurs rubs or gallops.  No JVD.  No lower extremity edema. Gastrointestinal system: Abdomen is nontender, nondistended, soft, positive bowel sounds.  No rebound.  No guarding.   Central nervous system:  Alert and oriented. No focal neurological deficits. Extremities: Symmetric 5 x 5 power. Skin: No rashes, lesions or ulcers Psychiatry: Judgement and insight appear normal. Mood & affect appropriate.     Data Reviewed: I have personally reviewed following labs and imaging studies  CBC: Recent Labs  Lab 08/20/18 1939 08/22/18 1117 08/23/18 0324 08/24/18 0515 08/25/18 0458  WBC 7.0 7.5 7.9 9.7 10.2  NEUTROABS  --  5.1 5.1  --   --   HGB 12.7 11.0* 10.4* 10.9* 10.5*  HCT 40.8 33.8* 30.6* 31.7* 31.1*  MCV 101.0* 97.1 91.6 91.1 93.7  PLT 286 221 199 197 834   Basic Metabolic Panel: Recent Labs  Lab 08/23/18 0324 08/23/18 1312 08/24/18 0515 08/25/18 0458 08/26/18 0307 08/27/18 0333  NA 142 144 146* 142 140 140  K 2.5* 4.7 2.8* 4.0 3.4* 3.7  CL 111 111 101 103 103 104  CO2 20* 24 33* 29 28 26   GLUCOSE 140* 143* 145* 98 97 104*  BUN 22 20 11 8 8  7*  CREATININE 1.75* 1.67* 1.38* 1.25* 1.19* 1.05*  CALCIUM 8.4* 8.4* 8.1* 7.7* 8.2* 8.5*  MG 2.5*  --  1.5* 2.4 1.7 2.3   GFR: Estimated Creatinine Clearance: 40.2 mL/min (A) (by C-G formula based  on SCr of 1.05 mg/dL (H)). Liver Function Tests: Recent Labs  Lab 08/20/18 1939  AST 24  ALT 30  ALKPHOS 69  BILITOT 1.5*  PROT 7.2  ALBUMIN 3.7   Recent Labs  Lab 08/20/18 1939  LIPASE 108*   No results for input(s): AMMONIA in the last 168 hours. Coagulation Profile: No results for input(s): INR, PROTIME in the last 168 hours. Cardiac Enzymes: No results for input(s): CKTOTAL, CKMB, CKMBINDEX, TROPONINI in the last 168 hours. BNP (last 3 results) No results for input(s): PROBNP in the last 8760 hours. HbA1C: No results for input(s): HGBA1C in the last 72 hours. CBG: Recent Labs  Lab 08/26/18 2018 08/27/18 0001 08/27/18 0403 08/27/18 0714 08/27/18 1158  GLUCAP 99 106* 95 92 82   Lipid Profile: No results for input(s): CHOL, HDL, LDLCALC, TRIG, CHOLHDL, LDLDIRECT in the last 72 hours. Thyroid Function  Tests: No results for input(s): TSH, T4TOTAL, FREET4, T3FREE, THYROIDAB in the last 72 hours. Anemia Panel: No results for input(s): VITAMINB12, FOLATE, FERRITIN, TIBC, IRON, RETICCTPCT in the last 72 hours. Sepsis Labs: No results for input(s): PROCALCITON, LATICACIDVEN in the last 168 hours.  Recent Results (from the past 240 hour(s))  SARS Coronavirus 2 (CEPHEID - Performed in [facility] [name] hospital lab), Hosp Order     Status: None   Collection Time: 08/21/18  1:07 AM   Specimen: Nasopharyngeal Swab  Result Value Ref Range Status   SARS Coronavirus 2 NEGATIVE NEGATIVE Final    Comment: (NOTE) If result is NEGATIVE SARS-CoV-2 target nucleic acids are NOT DETECTED. The SARS-CoV-2 RNA is generally detectable in upper and lower  respiratory specimens during the acute phase of infection. The lowest  concentration of SARS-CoV-2 viral copies this assay can detect is 250  copies / mL. A negative result does not preclude SARS-CoV-2 infection  and should not be used as the sole basis for treatment or other  patient management decisions.  A negative result may occur with  improper specimen collection / handling, submission of specimen other  than nasopharyngeal swab, presence of viral mutation(s) within the  areas targeted by this assay, and inadequate number of viral copies  (<250 copies / mL). A negative result must be combined with clinical  observations, patient history, and epidemiological information. If result is POSITIVE SARS-CoV-2 target nucleic acids are DETECTED. The SARS-CoV-2 RNA is generally detectable in upper and lower  respiratory specimens dur ing the acute phase of infection.  Positive  results are indicative of active infection with SARS-CoV-2.  Clinical  correlation with patient history and other diagnostic information is  necessary to determine patient infection status.  Positive results do  not rule out bacterial infection or co-infection with other viruses. If  result is PRESUMPTIVE POSTIVE SARS-CoV-2 nucleic acids MAY BE PRESENT.   A presumptive positive result was obtained on the submitted specimen  and confirmed on repeat testing.  While 2019 novel coronavirus  (SARS-CoV-2) nucleic acids may be present in the submitted sample  additional confirmatory testing may be necessary for epidemiological  and / or clinical management purposes  to differentiate between  SARS-CoV-2 and other Sarbecovirus currently known to infect humans.  If clinically indicated additional testing with an alternate test  methodology [phone]) is advised. The SARS-CoV-2 RNA is generally  detectable in upper and lower respiratory sp ecimens during the acute  phase of infection. The expected result is Negative. Fact Sheet for Patients:  StrictlyIdeas.no Fact Sheet for Healthcare Providers: BankingDealers.co.za This test is not yet  approved or cleared by the [place] and has been authorized for detection and/or diagnosis of SARS-CoV-2 by FDA under an Emergency Use Authorization (EUA).  This EUA will remain in effect (meaning this test can be used) for the duration of the COVID-19 declaration under Section 564(b)(1) of the Act, 21 U.S.C. section 360bbb-3(b)(1), unless the authorization is terminated or revoked sooner. Performed at [facility], [place] [address] [place], [place] [address]   Gastrointestinal Panel by PCR , Stool     Status: None   Collection Time: 08/22/18 12:55 PM   Specimen: Stool  Result Value Ref Range Status   Campylobacter species NOT DETECTED NOT DETECTED Final   Plesimonas shigelloides NOT DETECTED NOT DETECTED Final   Salmonella species NOT DETECTED NOT DETECTED Final   Yersinia enterocolitica NOT DETECTED NOT DETECTED Final   Vibrio species NOT DETECTED NOT DETECTED Final   Vibrio cholerae NOT DETECTED NOT DETECTED Final   Enteroaggregative E coli (EAEC) NOT DETECTED NOT DETECTED  Final   Enteropathogenic E coli (EPEC) NOT DETECTED NOT DETECTED Final   Enterotoxigenic E coli (ETEC) NOT DETECTED NOT DETECTED Final   Shiga like toxin producing E coli (STEC) NOT DETECTED NOT DETECTED Final   Shigella/Enteroinvasive E coli (EIEC) NOT DETECTED NOT DETECTED Final   Cryptosporidium NOT DETECTED NOT DETECTED Final   Cyclospora cayetanensis NOT DETECTED NOT DETECTED Final   Entamoeba histolytica NOT DETECTED NOT DETECTED Final   Giardia lamblia NOT DETECTED NOT DETECTED Final   Adenovirus F40/41 NOT DETECTED NOT DETECTED Final   Astrovirus NOT DETECTED NOT DETECTED Final   Norovirus GI/GII NOT DETECTED NOT DETECTED Final   Rotavirus A NOT DETECTED NOT DETECTED Final   Sapovirus (I, II, IV, and V) NOT DETECTED NOT DETECTED Final    Comment: Performed at [facility], [address] [place], [place] [address]  Culture, Urine     Status: None   Collection Time: 08/22/18  9:19 PM   Specimen: Urine, Random  Result Value Ref Range Status   Specimen Description URINE, RANDOM  Final   Special Requests NONE  Final   Culture   Final    NO GROWTH Performed at [facility], [address], [place], [place] [address]    Report Status 08/23/2018 FINAL  Final         Radiology Studies: No results found.      Scheduled Meds: . apixaban  2.5 mg Oral BID  . cholestyramine  4 g Oral Daily  . hydrALAZINE  50 mg Oral BID  . insulin aspart  0-9 Units Subcutaneous Q4H  . lipase/protease/amylase  36,000 Units Oral TID WC  . loperamide  4 mg Oral TID  . magnesium oxide  400 mg Oral BID  . multivitamin with minerals  1 tablet Oral Daily  . nebivolol  5 mg Oral Daily  . pantoprazole  40 mg Oral Daily  . pravastatin  20 mg Oral q1800   Continuous Infusions:    LOS: 6 days    Time spent: 35 minutes    Irine Seal, MD [facility]  If 7PM-7AM, please contact night-coverage [email or web address] 08/27/2018, 12:06 PM

## 2018-08-27 NOTE — Progress Notes (Signed)
Subjective: She reports 3 episodes of watery diarrhea today.  In fact, this is her average.  Objective: Vital signs in last 24 hours: Temp:  [98.3 F (36.8 C)-98.6 F (37 C)] 98.3 F (36.8 C) (07/13 0818) Pulse Rate:  [56-60] 59 (07/13 1714) Resp:  [18] 18 (07/13 1714) BP: (152-177)/(70-78) 177/77 (07/13 1714) SpO2:  [97 %-99 %] 98 % (07/13 1714) Weight:  [63.7 kg] 63.7 kg (07/12 2018) Last BM Date: 08/27/18  Intake/Output from previous day: 07/12 0701 - 07/13 0700 In: 88.1 [IV Piggyback:88.1] Out: 700 [Urine:700] Intake/Output this shift: Total I/O In: 340 [P.O.:240; Other:100] Out: 0   General appearance: alert and no distress GI: soft, non-tender; bowel sounds normal; no masses,  no organomegaly  Lab Results: Recent Labs    08/25/18 0458  WBC 10.2  HGB 10.5*  HCT 31.1*  PLT 164   BMET Recent Labs    08/25/18 0458 08/26/18 0307 08/27/18 0333  NA 142 140 140  K 4.0 3.4* 3.7  CL 103 103 104  CO2 29 28 26   GLUCOSE 98 97 104*  BUN 8 8 7*  CREATININE 1.25* 1.19* 1.05*  CALCIUM 7.7* 8.2* 8.5*   LFT No results for input(s): PROT, ALBUMIN, AST, ALT, ALKPHOS, BILITOT, BILIDIR, IBILI in the last 72 hours. PT/INR No results for input(s): LABPROT, INR in the last 72 hours. Hepatitis Panel No results for input(s): HEPBSAG, HCVAB, HEPAIGM, HEPBIGM in the last 72 hours. C-Diff No results for input(s): CDIFFTOX in the last 72 hours. Fecal Lactopherrin No results for input(s): FECLLACTOFRN in the last 72 hours.  Studies/Results: No results found.  Medications:  Scheduled: . apixaban  2.5 mg Oral BID  . cholestyramine  4 g Oral Daily  . hydrALAZINE  50 mg Oral BID  . insulin aspart  0-9 Units Subcutaneous Q4H  . lipase/protease/amylase  36,000 Units Oral TID WC  . loperamide  4 mg Oral TID  . magnesium oxide  400 mg Oral BID  . multivitamin with minerals  1 tablet Oral Daily  . nebivolol  5 mg Oral Daily  . pantoprazole  40 mg Oral Daily  . pravastatin   20 mg Oral q1800   Continuous:   Assessment/Plan: 1) Diarrhea. 2) ARI - resolved.   The patient's latest stool was observed.  It is very watery.  The patient does not appear to have an infectious or inflammatory source for her diarrhea.  Osmotic can be a possibility and oral magnesium was stopped.  She inquired about Embrel as being a source for her diarrhea and it is listed as a 3-16% reported side effect.  It does not appear that she received any dosing in the hospital.  Secretory diarrhea is a potential.  She does not appear to have any response with scheduled antidiarrheals, which is the case from the most recent hospitalization.  Octreotide will be tried.  Plan: 1) Stop oral magnesium. 2) Trial of octreotide 100 mcg TID. 3) Bystolic may be a source for diarrhea.  I recently encountered this issue with a patient in the outpatient setting.  If there is no improvement with octreotide, then hold this medication may help.  LOS: 6 days   Loa Idler D 08/27/2018, 6:39 PM

## 2018-08-28 LAB — GLUCOSE, CAPILLARY
Glucose-Capillary: 103 mg/dL — ABNORMAL HIGH (ref 70–99)
Glucose-Capillary: 134 mg/dL — ABNORMAL HIGH (ref 70–99)
Glucose-Capillary: 136 mg/dL — ABNORMAL HIGH (ref 70–99)
Glucose-Capillary: 122 mg/dL — ABNORMAL HIGH (ref 70–99)
Glucose-Capillary: 95 mg/dL (ref 70–99)
Glucose-Capillary: 110 mg/dL — ABNORMAL HIGH (ref 70–99)

## 2018-08-28 LAB — BASIC METABOLIC PANEL
Anion gap: 9 (ref 5–15)
BUN: 6 mg/dL — ABNORMAL LOW (ref 8–23)
CO2: 26 mmol/L (ref 22–32)
Calcium: 9 mg/dL (ref 8.9–10.3)
Chloride: 106 mmol/L (ref 98–111)
Creatinine, Ser: 1.26 mg/dL — ABNORMAL HIGH (ref 0.44–1.00)
GFR calc Af Amer: 49 mL/min — ABNORMAL LOW (ref >60)
GFR calc non Af Amer: 42 mL/min — ABNORMAL LOW (ref >60)
Glucose, Bld: 139 mg/dL — ABNORMAL HIGH (ref 70–99)
Potassium: 5.2 mmol/L — ABNORMAL HIGH (ref 3.5–5.1)
Sodium: 141 mmol/L (ref 135–145)

## 2018-08-28 LAB — MAGNESIUM: Magnesium: 1.6 mg/dL — ABNORMAL LOW (ref 1.7–2.4)

## 2018-08-28 LAB — POTASSIUM: Potassium: 5.1 mmol/L (ref 3.5–5.1)

## 2018-08-28 MED ORDER — FAMOTIDINE 40 MG/5ML PO SUSR
20.0000 mg | Freq: Every day | ORAL | Status: DC
  Administered 2018-08-29: 20 mg via ORAL
  Filled 2018-08-28: qty 2.5

## 2018-08-28 MED ORDER — MAGNESIUM SULFATE 4 GM/100ML IV SOLN: 4.0000 g | Freq: Once | INTRAVENOUS | Status: DC

## 2018-08-28 MED ORDER — MAGNESIUM SULFATE 2 GM/50ML IV SOLN
2.0000 g | Freq: Once | INTRAVENOUS | Status: AC
  Administered 2018-08-28: 2 g via INTRAVENOUS
  Filled 2018-08-28: qty 50

## 2018-08-28 NOTE — Plan of Care (Signed)
  Problem: Elimination: Goal: Will not experience complications related to bowel motility Outcome: Not Progressing

## 2018-08-28 NOTE — Care Management Important Message (Signed)
Important Message  Patient Details  Name: [name] MRN: [id number] Date of Birth: [birth year]   Medicare Important Message Given:  Yes     Elaijah Munoz [address] 08/28/2018, 1:36 PM

## 2018-08-28 NOTE — Progress Notes (Addendum)
PROGRESS NOTE    [name]  BOF:[id number] DOB: [birth year] DOA: 08/20/2018 PCP: [name], MD    Brief Narrative:  73 year old female who presented with nausea, vomiting and diarrhea.  He does have significant past medical history for type 2 diabetes mellitus, hypertension and dyslipidemia.  She reported symptoms persistent for the last month, hospitalization June 17 to June 26 for similar symptoms.  She had extensive gastrointestinal work-up with no conclusive diagnosis.  Initial physical examination blood pressure 122/89, heart rate 86, respirate 19, oxygen saturation 100%, moist mucous membranes, lungs clear to auscultation bilaterally, heart S1-S2 present and rhythmic, abdomen soft nontender, no lower extremity edema sodium 142, potassium 4.4, chloride 118, bicarb 11, glucose 95, BUN 53, creatinine 3.89, white count 7.0, hemoglobin 12.7, hematocrit 40.8, platelets 286, SARS COVID-19 was negative, urinalysis more than 50 white cells, 11-20 red cells, specific gravity 1.017, 100 protein.  CT of the abdomen of the abdomen with fluid-filled bowel, no acute changes.  Patient was admitted to the hospital working diagnosis of acute kidney injury due to acute diarrhea.   Assessment & Plan:   Principal Problem:   Acute kidney failure (HCC) Active Problems:   Diabetes type 2, controlled ([place])   Pulmonary embolism (HCC)   Nausea vomiting and diarrhea   Acute lower UTI   Dehydration   Vomiting and diarrhea  1 hypovolemic acute kidney injury with non-anion gap metabolic acidosis Likely secondary to prerenal azotemia secondary to severe diarrhea that patient had presented with decreased oral intake.  Creatinine on admission was 3.89 from 0.82 on 08/10/2018.  Urinalysis with large leukocytes nitrite negative greater than 50 WBCs.  100 protein noted in urine.  CT abdomen and pelvis with no hydronephrosis or perinephric edema.  Unchanged cystic lesion in the right mid kidney from recent  exams.  Urinary bladder is nondistended.  No bladder wall thickening noted.  Urine output not accurately recorded.  Acidosis resolved on bicarb drip.  Renal function improved creatinine currently at 1.26.  Avoid nephrotoxins.  IV fluids have been saline locked.  Follow.   2.  Hypokalemia/hypomagnesemia. Likely secondary to GI losses.  Potassium currently at 5.1.  Magnesium at 1.6.  Oral magnesium supplementation has been discontinued secondary to patient's diarrhea.  We will give magnesium sulfate 2 g IV x1.  Repeat labs in the morning.   3.  Urinary tract infection versus bacteriuria Urine cultures negative.  Patient s/p 3 days IV antibiotics.  No further antibiotics needed.  4.  Acute on chronic diarrhea Questionable etiology.  GI pathogen panel negative. GI work-up during last hospitalization was unremarkable with GI infectious panel including C. difficile been negative.  Patient with decreasing frequency of loose stools however no improvement with consistency per patient.  GI consulted patient seen in consultation by Dr. Collene Mares, who was recommending an unprepped colonoscopy to be done today to obtain mucosal so biopsies to rule out microscopic colitis.  Per GI patient also with fatty atrophic pancreas, exocrine pancreatic insufficiency which may be contributing to patient's diarrhea.  Patient started on pancreatic supplementation.  Patient underwent unprepped colonoscopy on 08/22/2018 per Dr. Collene Mares with biopsies taken to rule out microscopic colitis.  Biopsies negative.  Patient started on Questran.  Patient also placed on scheduled Imodium per GI with no significant change with loose stools.  Patient started on octreotide per GI which we will continue for now.  Will discontinue PPI and placed on Pepcid instead.  Per GI.  5.  Pulmonary embolism Patient noted to  be on Xarelto at home prior to admission.  CT chest from November 2019 with nonocclusive filling defect in the distal left main pulmonary artery.   Eliquis started at prophylactic dose of 2.5 mg twice daily.  Anticoagulation resumed after being held for unprepped colonoscopy on 08/22/2018.  No signs of bleeding.  Outpatient follow-up with PCP to determine whether patient still needs treatment for PE.  Will likely not sent home on Xarelto.  Will need outpatient follow-up with PCP.Marland Kitchen  Follow.   6.  Type 2 diabetes Hemoglobin A1c was 6.4 on 04/30/2018.  CBG of 136 this morning. Continue sliding scale insulin.  7.  Hypertension Due to patient's acute kidney injury on presentation patient's Benicar was discontinued.  Patient currently on Bystolic.  Hydralazine added to patient's regimen and will increase to 75 mg twice daily for better blood pressure control.  Will likely not resume Benicar on discharge.  Follow.   DVT prophylaxis: Eliquis. Code Status: Full Family Communication: Updated patient.  Disposition Plan: Home when diarrhea improved and when okay with GI.   Consultants:   Gastroenterology: Dr. Collene Mares 08/21/2018  Procedures:   CT abdomen and pelvis 08/21/2018  Unprepped colonoscopy per Dr. Collene Mares 08/22/2018  Antimicrobials:  IV Rocephin 08/21/2018>>>>>> 08/24/2018   Subjective: Patient sitting up in bed.  States had 2 loose stools this morning.  Had another loose stool overnight.  Denies any chest pain or shortness of breath.  Nausea improving.    Objective: Vitals:   08/27/18 1714 08/27/18 2033 08/28/18 0445 08/28/18 0757  BP: (!) 177/77 (!) 147/73 (!) 154/80 (!) 174/76  Pulse: (!) 59 (!) 58 65 (!) 52  Resp: 18 18 18 16   Temp:  98.4 F (36.9 C) 97.9 F (36.6 C) 98.3 F (36.8 C)  TempSrc:  Oral  Oral  SpO2: 98% 100% 94% 100%  Weight:  63.9 kg    Height:        Intake/Output Summary (Last 24 hours) at 08/28/2018 1250 Last data filed at 08/28/2018 1016 Gross per 24 hour  Intake 120 ml  Output 0 ml  Net 120 ml   Filed Weights   08/25/18 2016 08/26/18 2018 08/27/18 2033  Weight: 63.7 kg 63.7 kg 63.9 kg     Examination:  General exam: NAD. Respiratory system: CTAB.  No wheezes, no crackles, no rhonchi.  Cardiovascular system: RRR no murmurs rubs or gallops.  No JVD.  No lower extremity edema.  Gastrointestinal system: Abdomen is soft, nontender, nondistended, positive bowel sounds.  No rebound.  No guarding. Central nervous system: Alert and oriented. No focal neurological deficits. Extremities: Symmetric 5 x 5 power. Skin: No rashes, lesions or ulcers Psychiatry: Judgement and insight appear normal. Mood & affect appropriate.     Data Reviewed: I have personally reviewed following labs and imaging studies  CBC: Recent Labs  Lab 08/22/18 1117 08/23/18 0324 08/24/18 0515 08/25/18 0458  WBC 7.5 7.9 9.7 10.2  NEUTROABS 5.1 5.1  --   --   HGB 11.0* 10.4* 10.9* 10.5*  HCT 33.8* 30.6* 31.7* 31.1*  MCV 97.1 91.6 91.1 93.7  PLT 221 199 197 992   Basic Metabolic Panel: Recent Labs  Lab 08/24/18 0515 08/25/18 0458 08/26/18 0307 08/27/18 0333 08/28/18 0500 08/28/18 0930  NA 146* 142 140 140 141  --   K 2.8* 4.0 3.4* 3.7 5.2* 5.1  CL 101 103 103 104 106  --   CO2 33* 29 28 26 26   --   GLUCOSE 145* 98 97 104* 139*  --  BUN 11 8 8  7* 6*  --   CREATININE 1.38* 1.25* 1.19* 1.05* 1.26*  --   CALCIUM 8.1* 7.7* 8.2* 8.5* 9.0  --   MG 1.5* 2.4 1.7 2.3 1.6*  --    GFR: Estimated Creatinine Clearance: 33.5 mL/min (A) (by C-G formula based on SCr of 1.26 mg/dL (H)). Liver Function Tests: No results for input(s): AST, ALT, ALKPHOS, BILITOT, PROT, ALBUMIN in the last 168 hours. No results for input(s): LIPASE, AMYLASE in the last 168 hours. No results for input(s): AMMONIA in the last 168 hours. Coagulation Profile: No results for input(s): INR, PROTIME in the last 168 hours. Cardiac Enzymes: No results for input(s): CKTOTAL, CKMB, CKMBINDEX, TROPONINI in the last 168 hours. BNP (last 3 results) No results for input(s): PROBNP in the last 8760 hours. HbA1C: No results for  input(s): HGBA1C in the last 72 hours. CBG: Recent Labs  Lab 08/27/18 2032 08/28/18 0101 08/28/18 0445 08/28/18 0752 08/28/18 1221  GLUCAP 109* 103* 134* 136* 122*   Lipid Profile: No results for input(s): CHOL, HDL, LDLCALC, TRIG, CHOLHDL, LDLDIRECT in the last 72 hours. Thyroid Function Tests: No results for input(s): TSH, T4TOTAL, FREET4, T3FREE, THYROIDAB in the last 72 hours. Anemia Panel: No results for input(s): VITAMINB12, FOLATE, FERRITIN, TIBC, IRON, RETICCTPCT in the last 72 hours. Sepsis Labs: No results for input(s): PROCALCITON, LATICACIDVEN in the last 168 hours.  Recent Results (from the past 240 hour(s))  SARS Coronavirus 2 (CEPHEID - Performed in [place] hospital lab), Hosp Order     Status: None   Collection Time: 08/21/18  1:07 AM   Specimen: Nasopharyngeal Swab  Result Value Ref Range Status   SARS Coronavirus 2 NEGATIVE NEGATIVE Final    Comment: (NOTE) If result is NEGATIVE SARS-CoV-2 target nucleic acids are NOT DETECTED. The SARS-CoV-2 RNA is generally detectable in upper and lower  respiratory specimens during the acute phase of infection. The lowest  concentration of SARS-CoV-2 viral copies this assay can detect is 250  copies / mL. A negative result does not preclude SARS-CoV-2 infection  and should not be used as the sole basis for treatment or other  patient management decisions.  A negative result may occur with  improper specimen collection / handling, submission of specimen other  than nasopharyngeal swab, presence of viral mutation(s) within the  areas targeted by this assay, and inadequate number of viral copies  (<250 copies / mL). A negative result must be combined with clinical  observations, patient history, and epidemiological information. If result is POSITIVE SARS-CoV-2 target nucleic acids are DETECTED. The SARS-CoV-2 RNA is generally detectable in upper and lower  respiratory specimens dur ing the acute phase of infection.   Positive  results are indicative of active infection with SARS-CoV-2.  Clinical  correlation with patient history and other diagnostic information is  necessary to determine patient infection status.  Positive results do  not rule out bacterial infection or co-infection with other viruses. If result is PRESUMPTIVE POSTIVE SARS-CoV-2 nucleic acids MAY BE PRESENT.   A presumptive positive result was obtained on the submitted specimen  and confirmed on repeat testing.  While 2019 novel coronavirus  (SARS-CoV-2) nucleic acids may be present in the submitted sample  additional confirmatory testing may be necessary for epidemiological  and / or clinical management purposes  to differentiate between  SARS-CoV-2 and other Sarbecovirus currently known to infect humans.  If clinically indicated additional testing with an alternate test  methodology [phone]) is advised. The SARS-CoV-2  RNA is generally  detectable in upper and lower respiratory sp ecimens during the acute  phase of infection. The expected result is Negative. Fact Sheet for Patients:  StrictlyIdeas.no Fact Sheet for Healthcare Providers: BankingDealers.co.za This test is not yet approved or cleared by the [place] FDA and has been authorized for detection and/or diagnosis of SARS-CoV-2 by FDA under an Emergency Use Authorization (EUA).  This EUA will remain in effect (meaning this test can be used) for the duration of the COVID-19 declaration under Section 564(b)(1) of the Act, 21 U.S.C. section 360bbb-3(b)(1), unless the authorization is terminated or revoked sooner. Performed at [facility], [place] [address] [place], [place] [address]   Gastrointestinal Panel by PCR , Stool     Status: None   Collection Time: 08/22/18 12:55 PM   Specimen: Stool  Result Value Ref Range Status   Campylobacter species NOT DETECTED NOT DETECTED Final   Plesimonas shigelloides NOT  DETECTED NOT DETECTED Final   Salmonella species NOT DETECTED NOT DETECTED Final   Yersinia enterocolitica NOT DETECTED NOT DETECTED Final   Vibrio species NOT DETECTED NOT DETECTED Final   Vibrio cholerae NOT DETECTED NOT DETECTED Final   Enteroaggregative E coli (EAEC) NOT DETECTED NOT DETECTED Final   Enteropathogenic E coli (EPEC) NOT DETECTED NOT DETECTED Final   Enterotoxigenic E coli (ETEC) NOT DETECTED NOT DETECTED Final   Shiga like toxin producing E coli (STEC) NOT DETECTED NOT DETECTED Final   Shigella/Enteroinvasive E coli (EIEC) NOT DETECTED NOT DETECTED Final   Cryptosporidium NOT DETECTED NOT DETECTED Final   Cyclospora cayetanensis NOT DETECTED NOT DETECTED Final   Entamoeba histolytica NOT DETECTED NOT DETECTED Final   Giardia lamblia NOT DETECTED NOT DETECTED Final   Adenovirus F40/41 NOT DETECTED NOT DETECTED Final   Astrovirus NOT DETECTED NOT DETECTED Final   Norovirus GI/GII NOT DETECTED NOT DETECTED Final   Rotavirus A NOT DETECTED NOT DETECTED Final   Sapovirus (I, II, IV, and V) NOT DETECTED NOT DETECTED Final    Comment: Performed at [facility], [address] [place], [place] [address]  Culture, Urine     Status: None   Collection Time: 08/22/18  9:19 PM   Specimen: Urine, Random  Result Value Ref Range Status   Specimen Description URINE, RANDOM  Final   Special Requests NONE  Final   Culture   Final    NO GROWTH Performed at [facility], [address], [place], [place] [address]    Report Status 08/23/2018 FINAL  Final         Radiology Studies: No results found.      Scheduled Meds: . apixaban  2.5 mg Oral BID  . cholestyramine  4 g Oral Daily  . hydrALAZINE  75 mg Oral BID  . insulin aspart  0-9 Units Subcutaneous Q4H  . lipase/protease/amylase  36,000 Units Oral TID WC  . loperamide  4 mg Oral TID  . multivitamin with minerals  1 tablet Oral Daily  . nebivolol  5 mg Oral Daily  . octreotide  100 mcg Subcutaneous  Q8H  . pantoprazole  40 mg Oral Daily  . pravastatin  20 mg Oral q1800   Continuous Infusions:    LOS: 7 days    Time spent: 35 minutes    Irine Seal, MD [facility]  If 7PM-7AM, please contact night-coverage [email or web address] 08/28/2018, 12:50 PM

## 2018-08-28 NOTE — Progress Notes (Signed)
Nutrition Follow-up  DOCUMENTATION CODES:   Not applicable  INTERVENTION:    Continue Magic cup TID with meals, each supplement provides 290 kcal and 9 grams of protein  Continue MVI daily  NUTRITION DIAGNOSIS:   Inadequate oral intake related to nausea, vomiting as evidenced by per patient/family report.  Progressing  GOAL:   Patient will meet greater than or equal to 90% of their needs  Progressing  MONITOR:   PO intake, Supplement acceptance, Skin, Weight trends, Labs, I & O's  REASON FOR ASSESSMENT:   Malnutrition Screening Tool    ASSESSMENT:   Patient with PMH significant for HLD, HTN, DM, chronic renal fissure, GERD, and CAD. Recently admitted 6/17 for acute enteritis. Presents this admission with compliant of N/V/D. Found to have AKF suspicious for ATN.   RD working remotely.  Per GI pt underwent unprepped colonoscopy- found to have fatty pancreas and exocrine pancreatic insufficiency which may be contributing to ongoing diarrhea.   Spoke with pt via phone. Diarrhea has slowed to three times a day but remains watery. Appetite is fair due to this. Meal completions charted as 25-75% for pt's last 5 meals. She does not enjoy the taste of hospital food. Intake of magic cups inconsistent. Pt does not wish to have anything else.  Admission weight: 64.1 kg Current weight 63.9 kg  Medications: SS novolog, creon 36,000 units TID, MVI with minerals, imodium Labs: K 5.2 (H) Mg 1.6 (L) CBG 82-136  Diet Order:   Diet Order            Diet heart healthy/carb modified Room service appropriate? Yes; Fluid consistency: Thin  Diet effective now              EDUCATION NEEDS:   Not appropriate for education at this time  Skin:  Skin Assessment: Reviewed RN Assessment  Last BM:  7/14  Height:   Ht Readings from Last 1 Encounters:  08/21/18 5\' 1"  (1.549 m)    Weight:   Wt Readings from Last 1 Encounters:  08/27/18 63.9 kg    Ideal Body Weight:  47.7  kg  BMI:  Body mass index is 26.62 kg/m.  Estimated Nutritional Needs:   Kcal:  1500-1700 kcal  Protein:  75-90 grams  Fluid:  >/= 1.5 L/day  Mariana Single RD, LDN Clinical Nutrition Pager # - [phone]

## 2018-08-28 NOTE — Progress Notes (Signed)
Subjective: She continues to have diarrhea.  Objective: Vital signs in last 24 hours: Temp:  [97.9 F (36.6 C)-98.4 F (36.9 C)] 98.3 F (36.8 C) (07/14 0757) Pulse Rate:  [52-65] 52 (07/14 0757) Resp:  [16-18] 16 (07/14 0757) BP: (147-177)/(73-80) 174/76 (07/14 0757) SpO2:  [94 %-100 %] 100 % (07/14 0757) Weight:  [63.9 kg] 63.9 kg (07/13 2033) Last BM Date: 08/28/18  Intake/Output from previous day: 07/13 0701 - 07/14 0700 In: 340 [P.O.:240] Out: 0  Intake/Output this shift: Total I/O In: 220 [P.O.:120; Other:50; IV Piggyback:50] Out: -   General appearance: alert and no distress GI: soft, non-tender; bowel sounds normal; no masses,  no organomegaly  Lab Results: No results for input(s): WBC, HGB, HCT, PLT in the last 72 hours. BMET Recent Labs    08/26/18 0307 08/27/18 0333 08/28/18 0500 08/28/18 0930  NA 140 140 141  --   K 3.4* 3.7 5.2* 5.1  CL 103 104 106  --   CO2 28 26 26   --   GLUCOSE 97 104* 139*  --   BUN 8 7* 6*  --   CREATININE 1.19* 1.05* 1.26*  --   CALCIUM 8.2* 8.5* 9.0  --    LFT No results for input(s): PROT, ALBUMIN, AST, ALT, ALKPHOS, BILITOT, BILIDIR, IBILI in the last 72 hours. PT/INR No results for input(s): LABPROT, INR in the last 72 hours. Hepatitis Panel No results for input(s): HEPBSAG, HCVAB, HEPAIGM, HEPBIGM in the last 72 hours. C-Diff No results for input(s): CDIFFTOX in the last 72 hours. Fecal Lactopherrin No results for input(s): FECLLACTOFRN in the last 72 hours.  Studies/Results: No results found.  Medications:  Scheduled: . apixaban  2.5 mg Oral BID  . cholestyramine  4 g Oral Daily  . hydrALAZINE  75 mg Oral BID  . insulin aspart  0-9 Units Subcutaneous Q4H  . lipase/protease/amylase  36,000 Units Oral TID WC  . loperamide  4 mg Oral TID  . multivitamin with minerals  1 tablet Oral Daily  . nebivolol  5 mg Oral Daily  . octreotide  100 mcg Subcutaneous Q8H  . pantoprazole  40 mg Oral Daily  . pravastatin   20 mg Oral q1800   Continuous:   Assessment/Plan: 1) Diarrhea - ? Etiology. 2) Renal insufficiency.   The patient is stable, but she continues to have diarrhea.  She showed me the results in the toilet.  The patient received 3 doses of octreotide without any discernable effect.  Her Embrel was last administered over a week ago and they medication should have washed out.  At home she was using Benicar, which is another potential culprit for diarrhea.  But, she has been off of this medication.  If there is no change in her diarrhea tomorrow her octreotide can be increased to 200 mcg q8 hours.  Maximum is 500 mcg q8 hours.  Consideration with holding nebivolol and pantoprazole will be kept in mind as other potential sources of diarrhea.  Plan: 1) Continue with octreotide with reassessment tomorrow.  LOS: 7 days   [name] D 08/28/2018, 2:08 PM

## 2018-08-29 DIAGNOSIS — E860 Dehydration: Principal | ICD-10-CM

## 2018-08-29 DIAGNOSIS — N179 Acute kidney failure, unspecified: Secondary | ICD-10-CM

## 2018-08-29 LAB — BASIC METABOLIC PANEL
Anion gap: 7 (ref 5–15)
BUN: 7 mg/dL — ABNORMAL LOW (ref 8–23)
CO2: 25 mmol/L (ref 22–32)
Calcium: 9 mg/dL (ref 8.9–10.3)
Chloride: 108 mmol/L (ref 98–111)
Creatinine, Ser: 1.19 mg/dL — ABNORMAL HIGH (ref 0.44–1.00)
GFR calc Af Amer: 52 mL/min — ABNORMAL LOW (ref >60)
GFR calc non Af Amer: 45 mL/min — ABNORMAL LOW (ref >60)
Glucose, Bld: 117 mg/dL — ABNORMAL HIGH (ref 70–99)
Potassium: 4.9 mmol/L (ref 3.5–5.1)
Sodium: 140 mmol/L (ref 135–145)

## 2018-08-29 LAB — GLUCOSE, CAPILLARY
Glucose-Capillary: 116 mg/dL — ABNORMAL HIGH (ref 70–99)
Glucose-Capillary: 143 mg/dL — ABNORMAL HIGH (ref 70–99)
Glucose-Capillary: 84 mg/dL (ref 70–99)

## 2018-08-29 LAB — MAGNESIUM: Magnesium: 2.1 mg/dL (ref 1.7–2.4)

## 2018-08-29 MED ORDER — FAMOTIDINE 20 MG PO TABS: 20.0000 mg | ORAL_TABLET | Freq: Every day | ORAL | 0 refills | Status: DC

## 2018-08-29 MED ORDER — LOPERAMIDE HCL 2 MG PO CAPS: 4.0000 mg | ORAL_CAPSULE | Freq: Three times a day (TID) | ORAL | 1 refills | Status: DC

## 2018-08-29 MED ORDER — PANCRELIPASE (LIP-PROT-AMYL) 36000-114000 UNITS PO CPEP: 36000.0000 [IU] | ORAL_CAPSULE | Freq: Three times a day (TID) | ORAL | 0 refills | Status: DC

## 2018-08-29 MED ORDER — HYDRALAZINE HCL 25 MG PO TABS: 75.0000 mg | ORAL_TABLET | Freq: Two times a day (BID) | ORAL | 0 refills | Status: DC

## 2018-08-29 NOTE — TOC Initial Note (Signed)
Transition of Care [facility]) - Initial/Assessment Note    Patient Details  Name: [name] MRN: [id number] Date of Birth: 06-24-1945  Transition of [facility]) CM/SW Contact:    Bartholomew Crews, RN Phone Number: [phone] 08/29/2018, 12:05 PM  Clinical Narrative:                 Spoke with patient at the bedside. PTA home alone. When asked if anyone check on her, she stated too many. Stated her daughter will pick her up when ready to transition home. Discussed HH needs and offered choice list - Noted active for PT with [facility]. Patient could not recall visit with [facility] agency, but agreed to CM calling her daugher - [name] at [phone]. Spoke with [name] who stated that [facility] had seen patient one time for what sounded like an admissions visit, and that mail had been received with a schedule to begin therapy 7/7, however, patient had readmitted to the hospital. [name] was in agreement with continuing therapies for [facility] with [facility] - advised of both PT and OT. Patient had advised that she received equipment (DME) last admission - rollator and 3N1. No concerns with medications at this time. CM to follow for transition of care needs.   Expected Discharge Plan: [place] Barriers to Discharge: Continued Medical Work up   Patient Goals and CMS Choice Patient states their goals for this hospitalization and ongoing recovery are:: dressed and ready to go home CMS Medicare.gov Compare Post Acute Care list provided to:: Patient Choice offered to / list presented to : Patient, Adult Children  Expected Discharge Plan and Services Expected Discharge Plan: [place] In-house Referral: NA Discharge Planning Services: CM Consult Post Acute Care Choice: [place] arrangements for the past 2 months: Single Family Home                 DME Arranged: N/A DME Agency: NA       HH Arranged: PT, OT HH Agency: [facility] Date HH  Agency Contacted: 08/29/18 Time HH Agency Contacted: 1100 Representative spoke with at [place] Arrangements/Services Living arrangements for the past 2 months: [place] with:: Self Patient language and need for interpreter reviewed:: Yes        Need for Family Participation in Patient Care: Yes (Comment) Care giver support system in place?: Yes (comment) Current home services: DME, Home PT Criminal Activity/Legal Involvement Pertinent to Current Situation/Hospitalization: No - Comment as needed  Activities of Daily Living      Permission Sought/Granted Permission sought to share information with : Family Supports Permission granted to share information with : Yes, Verbal Permission Granted  Share Information with NAME: [name]     Permission granted to share info w Relationship: daughter  Permission granted to share info w Contact Information: [phone]  Emotional Assessment Appearance:: Appears stated age Attitude/Demeanor/Rapport: Engaged Affect (typically observed): Accepting Orientation: : Oriented to Situation, Oriented to  Time, Oriented to Place, Oriented to Self Alcohol / Substance Use: Not Applicable Psych Involvement: No (comment)  Admission diagnosis:  Dehydration [E86.0] Vomiting and diarrhea [R11.10, R19.7] AKI (acute kidney injury) ([place]) [N17.9] Patient Active Problem List   Diagnosis Date Noted  . Dehydration   . Vomiting and diarrhea   . Acute kidney failure ([place]) 08/21/2018  . Acute lower UTI 08/21/2018  . Nausea vomiting and diarrhea 08/20/2018  . Enteritis 08/19/2018  . Acute ischemic enteritis (  [place]) 08/01/2018  . Syncope 08/01/2018  . Hypokalemia 08/01/2018  . Abnormal CT scan, pelvis 07/31/2018  . Pneumothorax 03/14/2018  . S/P bronchoscopy with biopsy   . Lung abnormality 01/11/2018  . Pulmonary embolism ([place]) 12/24/2017  . Lung mass 12/24/2017  . Small vessel disease, cerebrovascular  07/31/2017  . Atypical chest pain 06/14/2017  . Dizziness 06/14/2017  . Easy bruising 05/30/2017  . Hyperuricemia 05/03/2017  . Arthralgia 05/01/2017  . Hair loss 05/01/2017  . Vitamin D deficiency 10/31/2016  . Redness of skin, feet 10/31/2016  . Diarrhea 06/06/2016  . Diastolic dysfunction 27/61/4709  . Vertigo 12/24/2015  . Fatigue 12/24/2015  . Nonspecific abnormal electrocardiogram (ECG) (EKG) 12/24/2015  . Bilateral carotid artery disease, Mild 05/31/2015  . Thyroid nodule 05/31/2015  . Psoriasis   . Scoliosis   . Diabetes type 2, controlled ([place]) 06/03/2008  . Dyslipidemia 06/03/2008  . CARPAL TUNNEL SYNDROME 06/03/2008  . HTN (hypertension) 06/03/2008  . ALLERGIC RHINITIS 06/03/2008  . Asthma 06/03/2008  . GERD (gastroesophageal reflux disease) 06/03/2008  . Osteopenia 06/03/2008  . COLONIC POLYPS, HX OF 06/03/2008   PCP:  [name], MD Pharmacy:   [facility], [place] [address] Phone: [phone] Fax: [phone]  [id number] - [place], [place]. AT [address] [place]. [place] [address] Phone: [phone] Fax: [phone]     Social Determinants of Health (SDOH) Interventions    Readmission Risk Interventions No flowsheet data found.

## 2018-08-29 NOTE — Progress Notes (Signed)
[name] to be discharged Home per MD order. Discussed prescriptions and follow up appointments with the patient. Prescriptions explained to patient; medication list explained in detail. Patient verbalized understanding.  Skin clean, dry and intact without evidence of skin break down, no evidence of skin tears noted. IV catheter discontinued intact. Site without signs and symptoms of complications. Dressing and pressure applied. Pt denies pain at the site currently. No complaints noted.  Patient free of lines, drains, and wounds.   An After Visit Summary (AVS) was printed and given to the patient. Patient escorted via wheelchair, and discharged home via private auto.  Amaryllis Dyke, RN

## 2018-08-29 NOTE — Plan of Care (Signed)
  Problem: Activity: Goal: Activity intolerance will improve Outcome: Completed/Met   Problem: Health Behavior/Discharge Planning: Goal: Ability to manage health-related needs will improve Outcome: Completed/Met   Problem: Clinical Measurements: Goal: Ability to maintain clinical measurements within normal limits will improve Outcome: Completed/Met Goal: Will remain free from infection Outcome: Completed/Met Goal: Diagnostic test results will improve Outcome: Completed/Met Goal: Respiratory complications will improve Outcome: Completed/Met Goal: Cardiovascular complication will be avoided Outcome: Completed/Met   Problem: Education: Goal: Knowledge of General Education information will improve Description: Including pain rating scale, medication(s)/side effects and non-pharmacologic comfort measures Outcome: Completed/Met   Problem: Health Behavior/Discharge Planning: Goal: Ability to manage health-related needs will improve Outcome: Completed/Met   Problem: Clinical Measurements: Goal: Ability to maintain clinical measurements within normal limits will improve Outcome: Completed/Met Goal: Will remain free from infection Outcome: Completed/Met

## 2018-08-29 NOTE — Progress Notes (Signed)
Occupational Therapy Treatment Patient Details Name: [name] MRN: [id number] DOB: October 05, 1945 Today's Date: 08/29/2018    History of present illness 73 y.o. female with medical history significant of diabetes, hypertension, coronary artery disease, GERD, vertigo, scoliosis, hyperlipidemia, asthma and chronic anticoagulation with Xarelto. Pt admitted with 1 week of persistent diarrhea with some nausea with syncope and acute enteritis   OT comments  Upon entering the room, pt supine in bed and fully dressed and reporting, " I'm ready to go home". Pt reports fully dressing herself without issue this morning. Pt was agreeable to B UE strengthening exercises with use of level 1 resistive theraband. Pt performed 2 sets of 10 chest pulls, shoulder flexion, shoulder diagonals, bicep curls, and alternating punches with min cuing for proper technique. OT answered questions regarding HHOT recommendation and discussed with pt what to do at home if she experienced a fall. Pt continues to benefit from OT intervention.   Follow Up Recommendations  Home health OT    Equipment Recommendations  None recommended by OT    Recommendations for Other Services      Precautions / Restrictions Precautions Precautions: Fall       Mobility Bed Mobility Overal bed mobility: Modified Independent       Supine to sit: Modified independent (Device/Increase time) Sit to supine: Modified independent (Device/Increase time)           Balance Overall balance assessment: Modified Independent Sitting-balance support: No upper extremity supported;Feet supported Sitting balance-Leahy Scale: Good Sitting balance - Comments: mod I on EOB        ADL either performed or assessed with clinical judgement   ADL      General ADL Comments: Pt is fully dressed in bed and reports donning clothing independent today in hopes of discharge     Vision Baseline Vision/History: Wears glasses Wears Glasses: At  all times            Cognition Arousal/Alertness: Awake/alert Behavior During Therapy: WFL for tasks assessed/performed Overall Cognitive Status: Within Functional Limits for tasks assessed                        Pertinent Vitals/ Pain       Pain Assessment: No/denies pain         Frequency  Min 2X/week        Progress Toward Goals  OT Goals(current goals can now be found in the care plan section)  Progress towards OT goals: Progressing toward goals  Acute Rehab OT Goals Patient Stated Goal: to go home OT Goal Formulation: With patient Time For Goal Achievement: 09/12/18 Potential to Achieve Goals: Good  Plan Discharge plan remains appropriate;Frequency remains appropriate       AM-PAC OT "6 Clicks" Daily Activity     Outcome Measure   Help from another person eating meals?: None Help from another person taking care of personal grooming?: None Help from another person toileting, which includes using toliet, bedpan, or urinal?: A Little Help from another person bathing (including washing, rinsing, drying)?: A Little Help from another person to put on and taking off regular upper body clothing?: None Help from another person to put on and taking off regular lower body clothing?: A Little 6 Click Score: 21    End of Session    OT Visit Diagnosis: Unsteadiness on feet (R26.81);Other abnormalities of gait and mobility (R26.89);Muscle weakness (generalized) (M62.81)   Activity Tolerance Patient tolerated treatment well   Patient Left  in bed;with call bell/phone within reach;with bed alarm set   Nurse Communication Mobility status        Time: 1003-4961 OT Time Calculation (min): 25 min  Charges: OT General Charges $OT Visit: 1 Visit OT Treatments $Therapeutic Activity: 8-22 mins $Therapeutic Exercise: 8-22 mins   Gypsy Decant 08/29/2018, 3:08 PM

## 2018-08-29 NOTE — Discharge Summary (Signed)
Physician Discharge Summary  [name] GMW:[id number] DOB: Sep 27, 1945 DOA: 08/20/2018  PCP: [name], MD  Admit date: 08/20/2018 Discharge date: 08/29/2018  Admitted From: Home Disposition: Home  Recommendations for Outpatient Follow-up:  1. Follow up with PCP in 1 week with repeat CBC/BMP 2. Outpatient follow-up with GI 3. Follow up in ED if symptoms worsen or new appear   Home Health: Home health PT/OT Equipment/Devices: None  Discharge Condition: Stable CODE STATUS: Full Diet recommendation: Heart healthy  Brief/Interim Summary: 73 year old female with history of diabetes mellitus type 2, hypertension and dyslipidemia, recent hospitalization for diarrhea with extensive work-up with no conclusive diagnosis presented with nausea, vomiting and diarrhea.  GI was consulted.  CT of the abdomen showed no acute changes.  COVID-19 testing was negative.  Her diarrhea is improving.  Currently on IV octreotide.  GI has cleared the patient for discharge.  She will be discharged on oral Imodium.  Discharge Diagnoses:  Principal Problem:   Acute kidney failure ([facility]) Active Problems:   Diabetes type 2, controlled ([facility])   Pulmonary embolism ([facility])   Nausea vomiting and diarrhea   Acute lower UTI   Dehydration   Vomiting and diarrhea  Acute kidney injury Non-anion gap metabolic acidosis -Mostly from prerenal azotemia and hypovolemia secondary to severe diarrhea -Creatinine on admission was 3.89 from 0.82 on 08/10/2018 -CT of the abdomen and pelvis with no hydronephrosis or perinephric edema.  Unchanged cystic lesion in the right mid kidney from recent exams -Acidosis resolved with bicarb drip. -Acidosis has resolved.  Renal function has much improved.  Outpatient follow-up of BMP.  Acute on chronic diarrhea -Questionable etiology.  GI following.  Has had recent extensive work-up including C. difficile which was negative. -Underwent colonoscopy on 08/22/2018: Biopsies negative so  far -Currently on Questran but as per GI, patient cannot tolerate Questran.  Was started on IV octreotide by GI.  Protonix discontinued. -Has had 2 bowel movements since yesterday.  Feels better.  GI has cleared the patient for discharge.  She will be discharged on oral Imodium.  Outpatient follow-up with GI next week.  Pulmonary embolism  -On Xarelto at home.  Will resume Xarelto  Diabetes mellitus type II -A1c 6.4 on 04/30/2018 -Continue CBGs with sliding scale insulin.  Hypertension  -Hydralazine has been increased to 75 mg twice a day.  Continue the same.  Heart rate is on the lower side.  Will not discharged on Bystolic.  Benicar has been discontinued because of acute kidney injury.  Outpatient follow-up.  Discharge Instructions  Discharge Instructions    Diet - low sodium heart healthy   Complete by: As directed    Increase activity slowly   Complete by: As directed      Allergies as of 08/29/2018      Reactions   Phenergan [promethazine Hcl] Shortness Of Breath, Anxiety   Amlodipine Swelling   Clarithromycin Nausea Only   I can pass out    Codeine Nausea Only   I can pass out    Erythromycin    Other reaction(s): Vomiting (intolerance)   Statins    Muscle pain    Tetracycline Other (See Comments)   unknown      Medication List    STOP taking these medications   ezetimibe 10 MG tablet Commonly known as: ZETIA   nebivolol 5 MG tablet Commonly known as: BYSTOLIC   olmesartan 20 MG tablet Commonly known as: BENICAR   omeprazole 20 MG capsule Commonly known as: PRILOSEC  pravastatin 20 MG tablet Commonly known as: PRAVACHOL   scopolamine 1 MG/3DAYS Commonly known as: TRANSDERM-SCOP     TAKE these medications   famotidine 20 MG tablet Commonly known as: Pepcid Take 1 tablet (20 mg total) by mouth daily. What changed:   medication strength  how much to take  when to take this   hydrALAZINE 25 MG tablet Commonly known as: APRESOLINE Take 3  tablets (75 mg total) by mouth 2 (two) times a day.   lipase/protease/amylase 36000 UNITS Cpep capsule Commonly known as: CREON Take 1 capsule (36,000 Units total) by mouth 3 (three) times daily with meals.   loperamide 2 MG capsule Commonly known as: IMODIUM Take 2 capsules (4 mg total) by mouth 3 (three) times daily.   ondansetron 4 MG tablet Commonly known as: ZOFRAN Take 4 mg by mouth every 8 (eight) hours as needed for nausea or vomiting.   rivaroxaban 20 MG Tabs tablet Commonly known as: Xarelto Take 1 tablet (20 mg total) by mouth daily with supper. OK to restart this medication on 03/15/2018.      Follow-up Information    [facility].   Specialty: Home Health Services Why: physical therapy and occupational therapy Contact information: [place] [address] [phone]        [name], MD. Schedule an appointment as soon as possible for a visit in 1 week(s).   Specialty: Internal Medicine Why: with repeat cbc/bmp Contact information: [place] [address] [phone]        Dorothy Spark, MD .   Specialty: Cardiology Contact information: [address] [place] [address] [phone]        Juanita Craver, MD. Schedule an appointment as soon as possible for a visit in 1 week(s).   Specialty: Gastroenterology Contact information: [address], [place] [address] [phone]          Allergies  Allergen Reactions  . Phenergan [Promethazine Hcl] Shortness Of Breath and Anxiety  . Amlodipine Swelling  . Clarithromycin Nausea Only    I can pass out   . Codeine Nausea Only    I can pass out   . Erythromycin     Other reaction(s): Vomiting (intolerance)  . Statins     Muscle pain   . Tetracycline Other (See Comments)    unknown    Consultations:  GI   Procedures/Studies: Ct Abdomen Pelvis Wo Contrast  Result Date: 08/21/2018 CLINICAL  DATA:  Persistent nausea, vomiting, diarrhea. Recent hospitalization for seen. EXAM: CT ABDOMEN AND PELVIS WITHOUT CONTRAST TECHNIQUE: Multidetector CT imaging of the abdomen and pelvis was performed following the standard protocol without IV contrast. COMPARISON:  CT 08/01/2018.  CT 07/27/2018 FINDINGS: Lower chest: Chronic scattered bilateral pulmonary opacities which are not significantly changed from prior exam, recently evaluated with chest CT. No acute airspace disease or pleural fluid. Hepatobiliary: No focal hepatic abnormality. Persistent high-density material in the gallbladder. No pericholecystic inflammation. No biliary dilatation. Pancreas: Fatty atrophy.  No ductal dilatation or inflammation. Spleen: Normal in size without focal abnormality. Adrenals/Urinary Tract: Normal adrenal glands. No hydronephrosis or perinephric edema. Unchanged cystic lesion in the right mid kidney from recent exams. Urinary bladder is nondistended. No bladder wall thickening. Stomach/Bowel: Small hiatal hernia. Stomach is nondistended. No bowel obstruction or inflammation. Similar fluid-filled small bowel from prior exam. Liquid stool in the proximal colon, fluid within the rectum. Minimal colonic diverticulosis. No  diverticulitis. Colon without colonic wall thickening or inflammation. Vascular/Lymphatic: Small central mesenteric nodes, unchanged from prior exam. No enlarged lymph nodes in the abdomen or pelvis. Aortic atherosclerosis and tortuosity. No aneurysm. Reproductive: Unenhanced uterus and bilateral adnexa are unremarkable. Hypodensity in the uterus on contrast-enhanced exam not appreciated. Other: No free air, free fluid, or intra-abdominal fluid collection. Musculoskeletal: Scoliosis and degenerative change. There are no acute or suspicious osseous abnormalities. IMPRESSION: 1. Similar fluid-filled bowel to prior exam, can be seen with unspecified enteritis. Liquid stool in portions of the colon consistent with  diarrhea. No bowel inflammation. 2. High-density material in the gallbladder, favor sludge. 3. No other acute findings. Multiple chronic findings are stable from recent exams. 4.  Aortic Atherosclerosis (ICD10-I70.0). Electronically Signed   By: Keith Rake M.D.   On: 08/21/2018 00:31   Ct Abdomen Pelvis Wo Contrast  Result Date: 08/01/2018 CLINICAL DATA:  Nausea for a week, diarrhea EXAM: CT ABDOMEN AND PELVIS WITHOUT CONTRAST TECHNIQUE: Multidetector CT imaging of the abdomen and pelvis was performed following the standard protocol without IV contrast. COMPARISON:  07/27/2018 FINDINGS: Lower chest: Stable low-attenuation 2 cm nodular density in the lingula likely reflecting a mucocele. Right middle lobe nodularity again noted. 13 mm known right lower lobe nodule. Hepatobiliary: No focal liver abnormality is seen. Hepatic steatosis. No gallstones, gallbladder wall thickening, or biliary dilatation. High density material within the gallbladder likely reflecting contrast from recent IV contrast administration. Pancreas: Unremarkable. No pancreatic ductal dilatation or surrounding inflammatory changes. Spleen: Normal in size without focal abnormality. Adrenals/Urinary Tract: Normal adrenal glands. 2.7 cm hypodense, fluid attenuating right renal mass consistent with a cyst. No urolithiasis or obstructive uropathy. Normal decompressed bladder. Stomach/Bowel: Small hiatal hernia. Stomach is within normal limits. No evidence of bowel wall thickening, distention, or inflammatory changes. Multiple fluid-filled loops of small as can be seen with mild enteritis. Vascular/Lymphatic: Normal caliber abdominal aorta with mild atherosclerosis. No lymphadenopathy. Reproductive: Uterus and bilateral adnexa are unremarkable. Other: No abdominal wall hernia or abnormality. No abdominopelvic ascites. Musculoskeletal: Thoracolumbar spine spondylosis. No acute osseous abnormality. No aggressive osseous lesion. Levoscoliosis of  the lumbar spine. IMPRESSION: 1. Multiple fluid-filled loops of small as can be seen with mild enteritis. 2.  Aortic Atherosclerosis (ICD10-I70.0). 3. 13 mm known right lower lobe nodule. As per the prior PET, chest CT follow-up is recommended. 4. Hepatic steatosis. 5. Right middle lobe and lingular low density nodules likely reflecting mucoceles. Electronically Signed   By: Kathreen Devoid   On: 08/01/2018 18:26   Ct Chest Wo Contrast  Result Date: 08/15/2018 CLINICAL DATA:  Pulmonary nodule. EXAM: CT CHEST WITHOUT CONTRAST TECHNIQUE: Multidetector CT imaging of the chest was performed following the standard protocol without IV contrast. COMPARISON:  03/06/2018, PET 01/01/2018 and CT chest 12/22/2017 and 12/05/2017. FINDINGS: Cardiovascular: Atherosclerotic calcification of the aorta, aortic valve and coronary arteries, including left main coronary artery. Heart size normal. No pericardial effusion. Mediastinum/Nodes: No pathologically enlarged mediastinal or axillary lymph nodes. Hilar regions are difficult to definitively evaluate without IV contrast. Esophagus is grossly unremarkable. Lungs/Pleura: Mild biapical pleuroparenchymal scarring, right greater than left. When compared to baseline exam of 12/22/2017, scattered areas of peribronchovascular nodularity appear somewhat improved. Nodular area of consolidation in the anterior left lower lobe also has decreased in size slightly, measuring 1.5 x 2.7 cm (series 3, image 101), compared to 1.9 x 3.1 cm on 12/22/2017. Mild volume loss in the medial left lower lobe as well as in the right middle and right lower lobes,  adjacent to the right hemidiaphragm. No pleural fluid. Airway is unremarkable. Upper Abdomen: Visualized portion of the liver is unremarkable. High density within the gallbladder is indicative of persistent vicarious excretion of contrast, related to recent IV contrast administration. Adrenal glands are unremarkable. Low-attenuation lesion in the  interpolar right kidney measures at least 2.8 cm but is incompletely imaged. Visualized portions of the left kidney, spleen, pancreas, stomach and bowel are unremarkable with exception of a tiny hiatal hernia. No upper abdominal adenopathy. Musculoskeletal: Degenerative changes in the spine. IMPRESSION: 1. Areas of peribronchovascular nodularity and nodular consolidation in the lungs bilaterally appear slightly improved from 12/22/2017. A benign post infectious/postinflammatory etiology is strongly favored. Follow-up CT chest without contrast in 1 year is recommended to ensure continued stability, as clinically indicated. 2. Aortic atherosclerosis (ICD10-170.0). Coronary artery calcification, including left main coronary artery. Electronically Signed   By: Lorin Picket M.D.   On: 08/15/2018 09:54     Colonoscopy on 08/22/2018  Subjective: Patient seen and examined at bedside.  She feels better and thinks that her diarrhea is improving.  No overnight fever or vomiting.  Discharge Exam: Vitals:   08/29/18 0429 08/29/18 1014  BP: (!) 148/66 140/75  Pulse: (!) 53 (!) 50  Resp: 18 18  Temp: 98.2 F (36.8 C) (!) 97.5 F (36.4 C)  SpO2: 95% 100%    General: Pt is alert, awake, not in acute distress Cardiovascular: Bradycardic, S1/S2 + Respiratory: bilateral decreased breath sounds at bases Abdominal: Soft, NT, ND, bowel sounds + Extremities: no edema, no cyanosis    The results of significant diagnostics from this hospitalization (including imaging, microbiology, ancillary and laboratory) are listed below for reference.     Microbiology: Recent Results (from the past 240 hour(s))  SARS Coronavirus 2 (CEPHEID - Performed in [facility] hospital lab), Hosp Order     Status: None   Collection Time: 08/21/18  1:07 AM   Specimen: Nasopharyngeal Swab  Result Value Ref Range Status   SARS Coronavirus 2 NEGATIVE NEGATIVE Final    Comment: (NOTE) If result is NEGATIVE SARS-CoV-2 target  nucleic acids are NOT DETECTED. The SARS-CoV-2 RNA is generally detectable in upper and lower  respiratory specimens during the acute phase of infection. The lowest  concentration of SARS-CoV-2 viral copies this assay can detect is 250  copies / mL. A negative result does not preclude SARS-CoV-2 infection  and should not be used as the sole basis for treatment or other  patient management decisions.  A negative result may occur with  improper specimen collection / handling, submission of specimen other  than nasopharyngeal swab, presence of viral mutation(s) within the  areas targeted by this assay, and inadequate number of viral copies  (<250 copies / mL). A negative result must be combined with clinical  observations, patient history, and epidemiological information. If result is POSITIVE SARS-CoV-2 target nucleic acids are DETECTED. The SARS-CoV-2 RNA is generally detectable in upper and lower  respiratory specimens dur ing the acute phase of infection.  Positive  results are indicative of active infection with SARS-CoV-2.  Clinical  correlation with patient history and other diagnostic information is  necessary to determine patient infection status.  Positive results do  not rule out bacterial infection or co-infection with other viruses. If result is PRESUMPTIVE POSTIVE SARS-CoV-2 nucleic acids MAY BE PRESENT.   A presumptive positive result was obtained on the submitted specimen  and confirmed on repeat testing.  While 2019 novel coronavirus  (SARS-CoV-2) nucleic acids may  be present in the submitted sample  additional confirmatory testing may be necessary for epidemiological  and / or clinical management purposes  to differentiate between  SARS-CoV-2 and other Sarbecovirus currently known to infect humans.  If clinically indicated additional testing with an alternate test  methodology [phone]) is advised. The SARS-CoV-2 RNA is generally  detectable in upper and lower  respiratory sp ecimens during the acute  phase of infection. The expected result is Negative. Fact Sheet for Patients:  StrictlyIdeas.no Fact Sheet for Healthcare Providers: BankingDealers.co.za This test is not yet approved or cleared by the [place] FDA and has been authorized for detection and/or diagnosis of SARS-CoV-2 by FDA under an Emergency Use Authorization (EUA).  This EUA will remain in effect (meaning this test can be used) for the duration of the COVID-19 declaration under Section 564(b)(1) of the Act, 21 U.S.C. section 360bbb-3(b)(1), unless the authorization is terminated or revoked sooner. Performed at [facility], [place] [address] [place], [place] [address]   Gastrointestinal Panel by PCR , Stool     Status: None   Collection Time: 08/22/18 12:55 PM   Specimen: Stool  Result Value Ref Range Status   Campylobacter species NOT DETECTED NOT DETECTED Final   Plesimonas shigelloides NOT DETECTED NOT DETECTED Final   Salmonella species NOT DETECTED NOT DETECTED Final   Yersinia enterocolitica NOT DETECTED NOT DETECTED Final   Vibrio species NOT DETECTED NOT DETECTED Final   Vibrio cholerae NOT DETECTED NOT DETECTED Final   Enteroaggregative E coli (EAEC) NOT DETECTED NOT DETECTED Final   Enteropathogenic E coli (EPEC) NOT DETECTED NOT DETECTED Final   Enterotoxigenic E coli (ETEC) NOT DETECTED NOT DETECTED Final   Shiga like toxin producing E coli (STEC) NOT DETECTED NOT DETECTED Final   Shigella/Enteroinvasive E coli (EIEC) NOT DETECTED NOT DETECTED Final   Cryptosporidium NOT DETECTED NOT DETECTED Final   Cyclospora cayetanensis NOT DETECTED NOT DETECTED Final   Entamoeba histolytica NOT DETECTED NOT DETECTED Final   Giardia lamblia NOT DETECTED NOT DETECTED Final   Adenovirus F40/41 NOT DETECTED NOT DETECTED Final   Astrovirus NOT DETECTED NOT DETECTED Final   Norovirus GI/GII NOT DETECTED NOT DETECTED Final    Rotavirus A NOT DETECTED NOT DETECTED Final   Sapovirus (I, II, IV, and V) NOT DETECTED NOT DETECTED Final    Comment: Performed at [facility], [place] [place], [place] [address]  Culture, Urine     Status: None   Collection Time: 08/22/18  9:19 PM   Specimen: Urine, Random  Result Value Ref Range Status   Specimen Description URINE, RANDOM  Final   Special Requests NONE  Final   Culture   Final    NO GROWTH Performed at [facility], [address] [place], [place] [address]    Report Status 08/23/2018 FINAL  Final     Labs: BNP (last 3 results) Recent Labs    12/23/17 2339  BNP 83.4   Basic Metabolic Panel: Recent Labs  Lab 08/25/18 0458 08/26/18 0307 08/27/18 0333 08/28/18 0500 08/28/18 0930 08/29/18 0420  NA 142 140 140 141  --  140  K 4.0 3.4* 3.7 5.2* 5.1 4.9  CL 103 103 104 106  --  108  CO2 29 28 26 26   --  25  GLUCOSE 98 97 104* 139*  --  117*  BUN 8 8 7* 6*  --  7*  CREATININE 1.25* 1.19* 1.05* 1.26*  --  1.19*  CALCIUM 7.7* 8.2* 8.5* 9.0  --  9.0  MG 2.4 1.7 2.3 1.6*  --  2.1   Liver Function Tests: No results for input(s): AST, ALT, ALKPHOS, BILITOT, PROT, ALBUMIN in the last 168 hours. No results for input(s): LIPASE, AMYLASE in the last 168 hours. No results for input(s): AMMONIA in the last 168 hours. CBC: Recent Labs  Lab 08/23/18 0324 08/24/18 0515 08/25/18 0458  WBC 7.9 9.7 10.2  NEUTROABS 5.1  --   --   HGB 10.4* 10.9* 10.5*  HCT 30.6* 31.7* 31.1*  MCV 91.6 91.1 93.7  PLT 199 197 164   Cardiac Enzymes: No results for input(s): CKTOTAL, CKMB, CKMBINDEX, TROPONINI in the last 168 hours. BNP: Invalid input(s): POCBNP CBG: Recent Labs  Lab 08/28/18 1557 08/28/18 2036 08/29/18 0427 08/29/18 0821 08/29/18 1132  GLUCAP 95 110* 116* 143* 84   D-Dimer No results for input(s): DDIMER in the last 72 hours. Hgb A1c No results for input(s): HGBA1C in the last 72 hours. Lipid Profile No results for  input(s): CHOL, HDL, LDLCALC, TRIG, CHOLHDL, LDLDIRECT in the last 72 hours. Thyroid function studies No results for input(s): TSH, T4TOTAL, T3FREE, THYROIDAB in the last 72 hours.  Invalid input(s): FREET3 Anemia work up No results for input(s): VITAMINB12, FOLATE, FERRITIN, TIBC, IRON, RETICCTPCT in the last 72 hours. Urinalysis    Component Value Date/Time   COLORURINE AMBER (A) 08/20/2018 2347   APPEARANCEUR CLOUDY (A) 08/20/2018 2347   LABSPEC 1.017 08/20/2018 2347   PHURINE 5.0 08/20/2018 2347   GLUCOSEU NEGATIVE 08/20/2018 2347   GLUCOSEU NEGATIVE 10/13/2009 0843   HGBUR SMALL (A) 08/20/2018 2347   BILIRUBINUR NEGATIVE 08/20/2018 2347   KETONESUR 5 (A) 08/20/2018 2347   PROTEINUR 100 (A) 08/20/2018 2347   UROBILINOGEN 0.2 10/13/2009 0843   NITRITE NEGATIVE 08/20/2018 2347   LEUKOCYTESUR LARGE (A) 08/20/2018 2347   Sepsis Labs Invalid input(s): PROCALCITONIN,  WBC,  LACTICIDVEN Microbiology Recent Results (from the past 240 hour(s))  SARS Coronavirus 2 (CEPHEID - Performed in [facility] hospital lab), Hosp Order     Status: None   Collection Time: 08/21/18  1:07 AM   Specimen: Nasopharyngeal Swab  Result Value Ref Range Status   SARS Coronavirus 2 NEGATIVE NEGATIVE Final    Comment: (NOTE) If result is NEGATIVE SARS-CoV-2 target nucleic acids are NOT DETECTED. The SARS-CoV-2 RNA is generally detectable in upper and lower  respiratory specimens during the acute phase of infection. The lowest  concentration of SARS-CoV-2 viral copies this assay can detect is 250  copies / mL. A negative result does not preclude SARS-CoV-2 infection  and should not be used as the sole basis for treatment or other  patient management decisions.  A negative result may occur with  improper specimen collection / handling, submission of specimen other  than nasopharyngeal swab, presence of viral mutation(s) within the  areas targeted by this assay, and inadequate number of viral copies   (<250 copies / mL). A negative result must be combined with clinical  observations, patient history, and epidemiological information. If result is POSITIVE SARS-CoV-2 target nucleic acids are DETECTED. The SARS-CoV-2 RNA is generally detectable in upper and lower  respiratory specimens dur ing the acute phase of infection.  Positive  results are indicative of active infection with SARS-CoV-2.  Clinical  correlation with patient history and other diagnostic information is  necessary to determine patient infection status.  Positive results do  not rule out bacterial infection or co-infection with other viruses.  If result is PRESUMPTIVE POSTIVE SARS-CoV-2 nucleic acids MAY BE PRESENT.   A presumptive positive result was obtained on the submitted specimen  and confirmed on repeat testing.  While 2019 novel coronavirus  (SARS-CoV-2) nucleic acids may be present in the submitted sample  additional confirmatory testing may be necessary for epidemiological  and / or clinical management purposes  to differentiate between  SARS-CoV-2 and other Sarbecovirus currently known to infect humans.  If clinically indicated additional testing with an alternate test  methodology [phone]) is advised. The SARS-CoV-2 RNA is generally  detectable in upper and lower respiratory sp ecimens during the acute  phase of infection. The expected result is Negative. Fact Sheet for Patients:  StrictlyIdeas.no Fact Sheet for Healthcare Providers: BankingDealers.co.za This test is not yet approved or cleared by the [place] FDA and has been authorized for detection and/or diagnosis of SARS-CoV-2 by FDA under an Emergency Use Authorization (EUA).  This EUA will remain in effect (meaning this test can be used) for the duration of the COVID-19 declaration under Section 564(b)(1) of the Act, 21 U.S.C. section 360bbb-3(b)(1), unless the authorization is terminated  or revoked sooner. Performed at [facility], [place] [address], [place], [place] [address]   Gastrointestinal Panel by PCR , Stool     Status: None   Collection Time: 08/22/18 12:55 PM   Specimen: Stool  Result Value Ref Range Status   Campylobacter species NOT DETECTED NOT DETECTED Final   Plesimonas shigelloides NOT DETECTED NOT DETECTED Final   Salmonella species NOT DETECTED NOT DETECTED Final   Yersinia enterocolitica NOT DETECTED NOT DETECTED Final   Vibrio species NOT DETECTED NOT DETECTED Final   Vibrio cholerae NOT DETECTED NOT DETECTED Final   Enteroaggregative E coli (EAEC) NOT DETECTED NOT DETECTED Final   Enteropathogenic E coli (EPEC) NOT DETECTED NOT DETECTED Final   Enterotoxigenic E coli (ETEC) NOT DETECTED NOT DETECTED Final   Shiga like toxin producing E coli (STEC) NOT DETECTED NOT DETECTED Final   Shigella/Enteroinvasive E coli (EIEC) NOT DETECTED NOT DETECTED Final   Cryptosporidium NOT DETECTED NOT DETECTED Final   Cyclospora cayetanensis NOT DETECTED NOT DETECTED Final   Entamoeba histolytica NOT DETECTED NOT DETECTED Final   Giardia lamblia NOT DETECTED NOT DETECTED Final   Adenovirus F40/41 NOT DETECTED NOT DETECTED Final   Astrovirus NOT DETECTED NOT DETECTED Final   Norovirus GI/GII NOT DETECTED NOT DETECTED Final   Rotavirus A NOT DETECTED NOT DETECTED Final   Sapovirus (I, II, IV, and V) NOT DETECTED NOT DETECTED Final    Comment: Performed at [facility], [address] [place], [place] [address]  Culture, Urine     Status: None   Collection Time: 08/22/18  9:19 PM   Specimen: Urine, Random  Result Value Ref Range Status   Specimen Description URINE, RANDOM  Final   Special Requests NONE  Final   Culture   Final    NO GROWTH Performed at [facility], [address] [place], [place] [address]    Report Status 08/23/2018 FINAL  Final     Time coordinating discharge: 35 minutes  SIGNED:   Aline August, MD  [facility] 08/29/2018, 2:55 PM

## 2018-08-29 NOTE — Progress Notes (Signed)
Physical Therapy Treatment Patient Details Name: [name] MRN: [id number] DOB: Jun 03, 1945 Today's Date: 08/29/2018    History of Present Illness 73 y.o. female with medical history significant of diabetes, hypertension, coronary artery disease, GERD, vertigo, scoliosis, hyperlipidemia, asthma and chronic anticoagulation with Xarelto. Pt admitted with 1 week of persistent diarrhea with some nausea with syncope and acute enteritis    PT Comments    Patient with great improvement, ambulating unit with modified independence utilizing RW.  Cont to rec  HHPT.   Follow Up Recommendations  Home health PT     Equipment Recommendations  None recommended by PT    Recommendations for Other Services OT consult     Precautions / Restrictions Precautions Precautions: Fall    Mobility  Bed Mobility Overal bed mobility: Modified Independent       Supine to sit: Modified independent (Device/Increase time) Sit to supine: Modified independent (Device/Increase time)      Transfers Overall transfer level: Modified independent Equipment used: None Transfers: Sit to/from Stand Sit to Stand: Modified independent (Device/Increase time)         General transfer comment: from bed  Ambulation/Gait Ambulation/Gait assistance: Modified independent (Device/Increase time) Gait Distance (Feet): 450 Feet Assistive device: None;Rolling walker (2 wheeled) Gait Pattern/deviations: Step-to pattern;Step-through pattern     General Gait Details: pt with and wtihout RW. safer with RW at this time but can ambulate without and supervisoni    Marine scientist [place] (Stroke Patients Only)       Balance Overall balance assessment: Modified Independent Sitting-balance support: No upper extremity supported;Feet supported Sitting balance-Leahy Scale: Good Sitting balance - Comments: mod I on EOB                                     Cognition Arousal/Alertness: Awake/alert Behavior During Therapy: WFL for tasks assessed/performed Overall Cognitive Status: Within Functional Limits for tasks assessed                                        Exercises      General Comments        Pertinent Vitals/Pain Pain Assessment: No/denies pain    Home Living                      Prior Function            PT Goals (current goals can now be found in the care plan section) Acute Rehab PT Goals Patient Stated Goal: to go home PT Goal Formulation: With patient Time For Goal Achievement: 09/04/18 Potential to Achieve Goals: Good Progress towards PT goals: Progressing toward goals    Frequency    Min 3X/week      PT Plan Current plan remains appropriate    Co-evaluation              AM-PAC PT "6 Clicks" Mobility   Outcome Measure  Help needed turning from your back to your side while in a flat bed without using bedrails?: None Help needed moving from lying on your back to sitting on the side of a flat bed without using bedrails?: None Help needed moving to and from a bed to a chair (including a wheelchair)?:  None Help needed standing up from a chair using your arms (e.g., wheelchair or bedside chair)?: None Help needed to walk in hospital room?: None Help needed climbing 3-5 steps with a railing? : A Little 6 Click Score: 23    End of Session Equipment Utilized During Treatment: Gait belt Activity Tolerance: Patient tolerated treatment well Patient left: in bed;with call bell/phone within reach Nurse Communication: Mobility status PT Visit Diagnosis: Other abnormalities of gait and mobility (R26.89);Muscle weakness (generalized) (M62.81)     Time: 1450-1510 PT Time Calculation (min) (ACUTE ONLY): 20 min  Charges:  $Gait Training: 8-22 mins                     Reinaldo Berber, PT, DPT Acute Rehabilitation Services Pager: [phone] Office:  [phone]     Reinaldo Berber 08/29/2018, 4:01 PM

## 2018-08-30 ENCOUNTER — Telehealth: Payer: Self-pay | Admitting: *Deleted

## 2018-08-30 NOTE — Telephone Encounter (Signed)
Transition Care Management Follow-up Telephone Call   Date discharged? 08/29/18   How have you been since you were released from the hospital? Pt states she is doing alright still tired just don't have any energy   Do you understand why you were in the hospital? YES   Do you understand the discharge instructions? YES   Where were you discharged to? Home   Items Reviewed:  Medications reviewed: YES, she states until she see MD was told to stop taking her Zetia, Bystolic, Benicar and Omeprazole  Allergies reviewed: YES  Dietary changes reviewed: YES, she states she haven't eating much because of the diarrhea. Advise to use the BRAT diet until she is able to hold something on her stomach, and to drink plenty of fluids to prevent dehydration  Referrals reviewed: YES, she states [facility] contacted her    Functional Questionnaire:   Activities of Daily Living (ADLs):   She states she are independent in the following: bathing and hygiene, feeding, continence, grooming, toileting and dressing States she require assistance with the following: ambulation   Any transportation issues/concerns?: YES, she states she is not able to drive, but will have someone to bring her   Any patient concerns? NO   Confirmed importance and date/time of follow-up visits scheduled YES, appt 09/05/18  Provider Appointment booked with Dr. Quay Burow  Confirmed with patient if condition begins to worsen call PCP or go to the ER.  Patient was given the office number and encouraged to call back with question or concerns.  : YES

## 2018-09-03 ENCOUNTER — Other Ambulatory Visit: Payer: Self-pay

## 2018-09-03 ENCOUNTER — Other Ambulatory Visit: Payer: Medicare Other | Admitting: *Deleted

## 2018-09-03 DIAGNOSIS — I2609 Other pulmonary embolism with acute cor pulmonale: Secondary | ICD-10-CM

## 2018-09-03 DIAGNOSIS — E782 Mixed hyperlipidemia: Secondary | ICD-10-CM

## 2018-09-03 DIAGNOSIS — Z789 Other specified health status: Secondary | ICD-10-CM

## 2018-09-03 DIAGNOSIS — I10 Essential (primary) hypertension: Secondary | ICD-10-CM

## 2018-09-03 DIAGNOSIS — R0600 Dyspnea, unspecified: Secondary | ICD-10-CM

## 2018-09-03 DIAGNOSIS — R0609 Other forms of dyspnea: Secondary | ICD-10-CM

## 2018-09-03 DIAGNOSIS — I2782 Chronic pulmonary embolism: Secondary | ICD-10-CM

## 2018-09-03 DIAGNOSIS — I25118 Atherosclerotic heart disease of native coronary artery with other forms of angina pectoris: Secondary | ICD-10-CM

## 2018-09-03 LAB — COMPREHENSIVE METABOLIC PANEL
ALT: 65 IU/L — ABNORMAL HIGH (ref 0–32)
AST: 41 IU/L — ABNORMAL HIGH (ref 0–40)
Albumin/Globulin Ratio: 1.4 (ref 1.2–2.2)
Albumin: 3.8 g/dL (ref 3.7–4.7)
Alkaline Phosphatase: 103 IU/L (ref 39–117)
BUN/Creatinine Ratio: 6 — ABNORMAL LOW (ref 12–28)
BUN: 8 mg/dL (ref 8–27)
Bilirubin Total: 0.4 mg/dL (ref 0.0–1.2)
CO2: 17 mmol/L — ABNORMAL LOW (ref 20–29)
Calcium: 9.1 mg/dL (ref 8.7–10.3)
Chloride: 105 mmol/L (ref 96–106)
Creatinine, Ser: 1.31 mg/dL — ABNORMAL HIGH (ref 0.57–1.00)
GFR calc Af Amer: 46 mL/min/{1.73_m2} — ABNORMAL LOW (ref >59)
GFR calc non Af Amer: 40 mL/min/{1.73_m2} — ABNORMAL LOW (ref >59)
Globulin, Total: 2.8 g/dL (ref 1.5–4.5)
Glucose: 97 mg/dL (ref 65–99)
Potassium: 3.8 mmol/L (ref 3.5–5.2)
Sodium: 141 mmol/L (ref 134–144)
Total Protein: 6.6 g/dL (ref 6.0–8.5)

## 2018-09-03 LAB — LIPID PANEL
Chol/HDL Ratio: 4.3 ratio (ref 0.0–4.4)
Cholesterol, Total: 179 mg/dL (ref 100–199)
HDL: 42 mg/dL (ref >39)
LDL Calculated: 87 mg/dL (ref 0–99)
Triglycerides: 248 mg/dL — ABNORMAL HIGH (ref 0–149)
VLDL Cholesterol Cal: 50 mg/dL — ABNORMAL HIGH (ref 5–40)

## 2018-09-04 NOTE — Progress Notes (Signed)
Subjective:    Patient ID: [name], female    DOB: 11/08/1945, 73 y.o.   MRN: [id number]  HPI The patient is here for follow up from the hospital.  She is here today with her friend.  Admittted 08/20/18 - 08/29/18  Recommendations for Outpatient Follow-up:  1. Follow up with PCP in 1 week with repeat CBC/BMP 2. Outpatient follow-up with GI 3. Follow up in ED if symptoms worsen or new appear   Home health: PT/OT  She was admitted in June for diarrhea, nausea/vomitting and underwent extensive w/u with obvious cause.  Elah.Spinner and GI pathogen panel was negative, Ct AB/pelvis showed no cause]. cipro / flagyl did not help.     Her symptoms worsened after discharge.  She had seen GI and tried xifixan but that did not help.  She continued to worsen and was advised to go back into the hospital.  In the ED she was found to have AKF - Dr 3.8 and BUN 53.  GI was consulted.  Ct of the abdomen and pelvis showed no acute changes - continued fluid-filled bowel and sludge in GB.  COVID was negative.   Diarrhea improved.  She was discharged to home.   Acute kidney injury: Non-anion gap metabolic acidosis Prerenal azotemia and hypovolemia due to severe diarrhea Cr initially 3.89-improved Ct scan showed no concerning renal findings Bicarb drip - acidosis resolved Renal function much improved Needs f/u BMP, which she did have yesterday  Acute on chronic diarrhea: ? 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  She still does not feel well and feels she has a long way to go.  GERD: Her GERD is not controlled.  She is taking Pepcid once a day and she is still having significant GERD.  She was on omeprazole prior to going into the hospital, but that was discontinued due to the acute kidney injury.  Diarrhea: She is still having liquid stools - as of yesterday it has been tan in color and started to be soft, thin semiformed stool.  She denies any lower abdominal pain or cramping.  She is still having some nausea and has no appetite.  She is trying to drink water, but does not like water and does not feel like drinking any fluids.  Her taste is different.  She has eaten chicken pot pie few times, peanut butter and banana sandwiches, soup macaroni and cheese.  Nothing sounds good.  She has not vomited.  She has not seen any blood in stool.  Hypertension: She is still just taking them 1 medication.  She was unsure if she should do anything different.  She has not had any chest pain or palpitations.  Foot swelling: She has been having foot swelling.  She denies any pain.  It does not seem to get better overnight or after elevating her legs.  Medications reviewed and list updated  Medications and allergies reviewed with patient and updated if appropriate.  Patient Active Problem List   Diagnosis Date Noted  . Dehydration   . Vomiting and diarrhea   . Acute kidney failure ([place]) 08/21/2018  . Acute lower UTI 08/21/2018  . Nausea vomiting and diarrhea 08/20/2018  . Enteritis 08/19/2018  . Acute ischemic enteritis ([place]) 08/01/2018  . Syncope 08/01/2018  . Hypokalemia 08/01/2018  . Abnormal CT scan, pelvis 07/31/2018  . Pneumothorax 03/14/2018  . S/P bronchoscopy with biopsy   . Lung abnormality 01/11/2018  . Pulmonary embolism ([place]) 12/24/2017  . Lung mass 12/24/2017  . Small vessel  disease, cerebrovascular 07/31/2017  . Atypical chest pain 06/14/2017  . Dizziness 06/14/2017  . Easy bruising 05/30/2017  . Hyperuricemia 05/03/2017  . Arthralgia 05/01/2017  . Hair loss 05/01/2017  . Vitamin D deficiency 10/31/2016  . Redness of skin, feet 10/31/2016  . Diarrhea 06/06/2016  . Diastolic dysfunction 16/11/9602  . Vertigo 12/24/2015  . Fatigue 12/24/2015  . Nonspecific abnormal electrocardiogram (ECG) (EKG) 12/24/2015  . Bilateral carotid artery disease, Mild 05/31/2015  . Thyroid nodule 05/31/2015  . Psoriasis   . Scoliosis   . Diabetes type 2, controlled ([place]) 06/03/2008  . Dyslipidemia 06/03/2008  . CARPAL TUNNEL SYNDROME 06/03/2008  . HTN (hypertension) 06/03/2008  . ALLERGIC RHINITIS 06/03/2008  . Asthma 06/03/2008  . GERD (gastroesophageal reflux disease) 06/03/2008  . Osteopenia 06/03/2008  . COLONIC POLYPS, HX OF 06/03/2008    Current Outpatient Medications on File Prior to Visit  Medication Sig Dispense Refill  . famotidine (PEPCID) 20 MG tablet Take 1 tablet (20 mg total) by mouth daily. 30 tablet 0  . hydrALAZINE (APRESOLINE) 25 MG tablet Take 3 tablets (75 mg total) by mouth 2 (two) times a day. 180 tablet 0  . lipase/protease/amylase (CREON) 36000 UNITS CPEP capsule Take 1 capsule (36,000 Units total) by mouth 3 (three) times daily with meals. 90 capsule 0  . loperamide (IMODIUM) 2 MG capsule Take 2 capsules (4 mg total) by mouth 3 (three) times daily. 30 capsule 1  . ondansetron (ZOFRAN) 4 MG tablet Take 4 mg by mouth every 8 (eight) hours as needed for nausea or vomiting.    . rivaroxaban (XARELTO) 20 MG TABS tablet Take 1 tablet (20 mg total) by mouth daily with supper. OK to restart this medication on 03/15/2018. 30 tablet 11   No current facility-administered medications on file prior to visit.     Past Medical History:  Diagnosis Date  . ALLERGIC RHINITIS   . ANEMIA-NOS   . Arthritis   . ASTHMA   . Carpal tunnel syndrome   . COLONIC  POLYPS, HX OF   . Coronary artery disease    "mild CAD" noted on 12/05/17 in coronary CT scan  . DIABETES MELLITUS, TYPE II    diet controlled  . GERD   . HYPERLIPIDEMIA   . HYPERTENSION   . OSTEOPENIA   . PONV (postoperative nausea and vomiting)   . Psoriasis    severe, began soriatane 01/2012  . Rectal fissure   . Scoliosis     Past Surgical History:  Procedure Laterality Date  . benign rectal growth  2004   removed by Dr. Zella Richer  . BIOPSY  08/09/2018   Procedure: BIOPSY;  Surgeon: Carol Ada, MD;  Location: [facility] ENDOSCOPY;  Service: Endoscopy;;  . BIOPSY  08/22/2018   Procedure: BIOPSY;  Surgeon: Juanita Craver, MD;  Location: [facility] ENDOSCOPY;  Service: Endoscopy;;  . BREAST BIOPSY Right   .  BREAST EXCISIONAL BIOPSY Left   . BREAST SURGERY  1988   biopsy  . CESAREAN SECTION    . COLONOSCOPY N/A 08/22/2018   Procedure: COLONOSCOPY;  Surgeon: Juanita Craver, MD;  [facility] END[facility]COPY;  Service: Endoscopy;  Laterality: N/A;  . ESOPHAGOGASTRODUODENOSCOPY (EGD) WITH PROPOFOL N/A 08/04/2018   Procedure: ESOPHAGOGASTRODUODENOSCOPY (EGD) WITH PROPOFOL;  Surgeon: Ladene Artist, MD;  Location: [place];  Service: Gastroenterology;  Laterality: N/A;  . FLEXIBLE SIGMOIDOSCOPY N/A 08/04/2018   Procedure: FLEXIBLE SIGMOIDOSCOPY;  Surgeon: Ladene Artist, MD;  Location: [place];  Service: Gastroenterology;  Laterality: N/A;  . FLEXIBLE SIGMOIDOSCOPY N/A 08/09/2018   Procedure: FLEXIBLE SIGMOIDOSCOPY;  Surgeon: Carol Ada, MD;  Location: [place];  Service: Endoscopy;  Laterality: N/A;  . VIDEO BRONCHOSCOPY WITH ENDOBRONCHIAL NAVIGATION N/A 03/14/2018   Procedure: VIDEO BRONCHOSCOPY WITH ENDOBRONCHIAL NAVIGATION;  Surgeon: Collene Gobble, MD;  Location: [facility] OR;  Service: Thoracic;  Laterality: N/A;    Social History   Socioeconomic History  . Marital status: Single    Spouse name: Not on file  . Number of children: 1  . Years of education: 55  . Highest education level:  Bachelor's degree (e.g., BA, AB, BS)  Occupational History  . Occupation: retired Careers adviser  Social Needs  . Financial resource strain: Not on file  . Food insecurity    Worry: Not on file    Inability: Not on file  . Transportation needs    Medical: Not on file    Non-medical: Not on file  Tobacco Use  . Smoking status: Former Smoker    Packs/day: 1.00    Years: 10.00    Pack years: 10.00    Quit date: 02/14/1974    Years since quitting: 44.5  . Smokeless tobacco: Never Used  Substance and Sexual Activity  . Alcohol use: No  . Drug use: No  . Sexual activity: Not on file  Lifestyle  . Physical activity    Days per week: Not on file    Minutes per session: Not on file  . Stress: Not on file  Relationships  . Social Herbalist on phone: Not on file    Gets together: Not on file    Attends religious service: Not on file    Active member of club or organization: Not on file    Attends meetings of clubs or organizations: Not on file    Relationship status: Not on file  Other Topics Concern  . Not on file  Social History Narrative   Lives alone in a one story home.  Has one child and one grandchild.  Retired Careers adviser.  Education: college.     Family History  Problem Relation Age of Onset  . Lung cancer Father   . Arthritis Other        Parents  . Asthma Other        parent, other relative  . Breast cancer Other        other relative  . Hypertension Other        parent, other relative  . Heart disease Other        parent, other relative  . Heart disease Mother   . Asthma Mother   . Breast cancer Maternal Aunt 68  . Parkinson's disease Maternal Grandmother   . Rheumatic fever Maternal Grandfather     Review of Systems  Constitutional: Negative for chills and fever.  Respiratory: Negative for cough, shortness of breath and wheezing.   Cardiovascular: Positive for leg swelling.  Negative for chest pain and palpitations.  Gastrointestinal: Positive for diarrhea and nausea. Negative for abdominal pain, blood in stool and vomiting.  Endocrine: Positive for cold intolerance.  Musculoskeletal: Positive for back pain (lower left back pain) and gait problem (balance issues, she feels it is chemical).  Neurological: Negative for light-headedness.       Objective:   Vitals:   09/05/18 1006  BP: 128/70  Pulse: 66  Temp: 98.3 F (36.8 C)  SpO2: 99%   BP Readings from Last 3 Encounters:  09/05/18 128/70  08/29/18 140/75  08/20/18 (!) 144/82   Wt Readings from Last 3 Encounters:  09/05/18 132 lb (59.9 kg)  08/28/18 140 lb 10.5 oz (63.8 kg)  08/20/18 128 lb (58.1 kg)   Body mass index is 24.94 kg/m.   Physical Exam    Constitutional: Appears well-developed. No distress.  HENT:  Head: Normocephalic and atraumatic.  Neck: Neck supple. No tracheal deviation present. No thyromegaly present.  No cervical lymphadenopathy Cardiovascular: Normal rate, regular rhythm and normal heart sounds.   No murmur heard. No carotid bruit .  1 + pitting b/l foot and LE edema Pulmonary/Chest: Effort normal and breath sounds normal. No respiratory distress. No has no wheezes. No rales.  Abdomen: soft, NT, ND Skin: Skin is warm and dry. Not diaphoretic. erythematous distal feet b/l - chronic and unchanged Psychiatric: Normal mood and affect. Behavior is normal.    Lab Results  Component Value Date   WBC 10.2 08/25/2018   HGB 10.5 (L) 08/25/2018   HCT 31.1 (L) 08/25/2018   PLT 164 08/25/2018   GLUCOSE 97 09/03/2018   CHOL 179 09/03/2018   TRIG 248 (H) 09/03/2018   HDL 42 09/03/2018   LDLDIRECT 165.0 04/30/2018   LDLCALC 87 09/03/2018   ALT 65 (H) 09/03/2018   AST 41 (H) 09/03/2018   NA 141 09/03/2018   K 3.8 09/03/2018   CL 105 09/03/2018   CREATININE 1.31 (H) 09/03/2018   BUN 8 09/03/2018   CO2 17 (L) 09/03/2018   TSH 2.889 08/09/2018   INR 1.00 03/14/2018    HGBA1C 6.4 04/30/2018   MICROALBUR <0.7 10/28/2015    CT ABDOMEN PELVIS WO CONTRAST CLINICAL DATA:  Persistent nausea, vomiting, diarrhea. Recent hospitalization for seen.  EXAM: CT ABDOMEN AND PELVIS WITHOUT CONTRAST  TECHNIQUE: Multidetector CT imaging of the abdomen and pelvis was performed following the standard protocol without IV contrast.  COMPARISON:  CT 08/01/2018.  CT 07/27/2018  FINDINGS: Lower chest: Chronic scattered bilateral pulmonary opacities which are not significantly changed from prior exam, recently evaluated with chest CT. No acute airspace disease or pleural fluid.  Hepatobiliary: No focal hepatic abnormality. Persistent high-density material in the gallbladder. No pericholecystic inflammation. No biliary dilatation.  Pancreas: Fatty atrophy.  No ductal dilatation or inflammation.  Spleen: Normal in size without focal abnormality.  Adrenals/Urinary Tract: Normal adrenal glands. No hydronephrosis or perinephric edema. Unchanged cystic lesion in the right mid kidney from recent exams. Urinary bladder is nondistended. No bladder wall thickening.  Stomach/Bowel: Small hiatal hernia. Stomach is nondistended. No bowel obstruction or inflammation. Similar fluid-filled small bowel from prior exam. Liquid stool in the proximal colon, fluid within the rectum. Minimal colonic diverticulosis. No diverticulitis. Colon without colonic wall thickening or inflammation.  Vascular/Lymphatic: Small central mesenteric nodes, unchanged from prior exam. No enlarged lymph nodes in the abdomen or pelvis. Aortic atherosclerosis and tortuosity.  No aneurysm.  Reproductive: Unenhanced uterus and bilateral adnexa are unremarkable. Hypodensity in the uterus on contrast-enhanced exam not appreciated.  Other: No free air, free fluid, or intra-abdominal fluid collection.  Musculoskeletal: Scoliosis and degenerative change. There are no acute or suspicious osseous  abnormalities.  IMPRESSION: 1. Similar fluid-filled bowel to prior exam, can be seen with unspecified enteritis. Liquid stool in portions of the colon consistent with diarrhea. No bowel inflammation. 2. High-density material in the gallbladder, favor sludge. 3. No other acute findings. Multiple chronic findings are stable from recent exams. 4.  Aortic Atherosclerosis (ICD10-I70.0).  Electronically Signed   By: Keith Rake M.D.   On: 08/21/2018 00:31   Assessment & Plan:    See Problem List for Assessment and Plan of chronic medical problems.

## 2018-09-04 NOTE — Progress Notes (Signed)
Virtual Visit via Video Note   This visit type was conducted due to national recommendations for restrictions regarding the COVID-19 Pandemic (e.g. social distancing) in an effort to limit this patient's exposure and mitigate transmission in our community.  Due to her co-morbid illnesses, this patient is at least at moderate risk for complications without adequate follow up.  This format is felt to be most appropriate for this patient at this time.  All issues noted in this document were discussed and addressed.  A limited physical exam was performed with this format.  Please refer to the patient's chart for her consent to telehealth for [facility].   Evaluation Performed:  3 months Follow-up visit  Date:  09/07/2018   ID:  [name], DOB Feb 20, 1945, MRN [id number]  Patient Location: Home Provider Location: Home  PCP:  [name], MD  Cardiologist:  Ena Dawley, MD  Electrophysiologist:  None   Chief Complaint: Lower extremity edema  History of Present Illness:    [name] is a 73 y.o. female with hx of difficult to control hypertension, hyperlipidemia, who is coming for concern of worsening shortness of breath on exertion, she has been having difficulties controlling her blood pressure, now managed well by Dr. Quay Burow, she was previously on amlodipine that caused lower extremity edema, her blood pressure could be as high as 200, also any stool low she gets dizzy and she was experiencing falls. She states that since his blood pressure has been better controlled her shortness of breath has improved but is still persistent.  She can only walk about quarter mile before she has to stop.  She has also developed unusual redness in her toes, denies any claudications. She has recently underwent brain MRI that showed progressive microvascular disease.  02/22/2018 -this is a follow-up, the patient underwent coronary CTA that showed mild nonobstructive CAD.  She was  started on pitavastatin 1 mg, she previously did not tolerate pravastatin and atorvastatin.  She developed hip pain and back pain and discontinued pitavastatin with some improvement of symptoms, she is willing to retry again.  She was also found to have a lung mass in her left lower lobe, full chest CT showed multiple other masses however PET CT was negative for malignancy.  She is now following with pulmonary Dr. Lake Bells who wants to perform bronchoscopy in the near future.  She denies any chest pain.  She recently had bronchitis and still is recovering from that continues to have productive cough.  She was given Levaquin by Dr. Lake Bells.  06/08/2018 - 3 months follow up, she underwent bronchoscopy that didn't show malignancy. BP well control, occassional dizziness, no falls, she walks up to 2 miles a day with no chest pain and minimal SOB. No fever. She tried to [employer], but daily livalo causes severe back pains. Now down to 2x/week. Minimal RLE edema, no pain.  09/07/2018 - 3 months follow up, the patient has been hospitalized twice with chronic diarrhea nausea and vomiting, she has been tested excessively including Salmonella, Shigella, Giardia, C. difficile with all the tests negative.  She continues to have mild diarrhea, she has lost over 10 pounds in 1 month.  She underwent colonoscopy without any bleeding, she discontinued her cholesterol medications, she was started on hydralazine.  She is complaining of lower extremity erythema and swelling no obvious claudications.    The patient does not have symptoms concerning for COVID-19 infection (fever, chills, cough, or new shortness of breath).  Past Medical History:  Diagnosis Date  . ALLERGIC RHINITIS   . ANEMIA-NOS   . Arthritis   . ASTHMA   . Carpal tunnel syndrome   . COLONIC POLYPS, HX OF   . Coronary artery disease    "mild CAD" noted on 12/05/17 in coronary CT scan  . DIABETES MELLITUS, TYPE II    diet controlled  . GERD    . HYPERLIPIDEMIA   . HYPERTENSION   . OSTEOPENIA   . PONV (postoperative nausea and vomiting)   . Psoriasis    severe, began soriatane 01/2012  . Rectal fissure   . Scoliosis    Past Surgical History:  Procedure Laterality Date  . benign rectal growth  2004   removed by Dr. Zella Richer  . BIOPSY  08/09/2018   Procedure: BIOPSY;  Surgeon: Carol Ada, MD;  Location: [facility] ENDOSCOPY;  Service: Endoscopy;;  . BIOPSY  08/22/2018   Procedure: BIOPSY;  Surgeon: Juanita Craver, MD;  Location: [facility] ENDOSCOPY;  Service: Endoscopy;;  . BREAST BIOPSY Right   . BREAST EXCISIONAL BIOPSY Left   . BREAST SURGERY  1988   biopsy  . CESAREAN SECTION    . COLONOSCOPY N/A 08/22/2018   Procedure: COLONOSCOPY;  Surgeon: Juanita Craver, MD;  Location: [facility] ENDOSCOPY;  Service: Endoscopy;  Laterality: N/A;  . ESOPHAGOGASTRODUODENOSCOPY (EGD) WITH PROPOFOL N/A 08/04/2018   Procedure: ESOPHAGOGASTRODUODENOSCOPY (EGD) WITH PROPOFOL;  Surgeon: Ladene Artist, MD;  Location: [place];  Service: Gastroenterology;  Laterality: N/A;  . FLEXIBLE SIGMOIDOSCOPY N/A 08/04/2018   Procedure: FLEXIBLE SIGMOIDOSCOPY;  Surgeon: Ladene Artist, MD;  Location: [place];  Service: Gastroenterology;  Laterality: N/A;  . FLEXIBLE SIGMOIDOSCOPY N/A 08/09/2018   Procedure: FLEXIBLE SIGMOIDOSCOPY;  Surgeon: Carol Ada, MD;  Location: [place];  Service: Endoscopy;  Laterality: N/A;  . VIDEO BRONCHOSCOPY WITH ENDOBRONCHIAL NAVIGATION N/A 03/14/2018   Procedure: VIDEO BRONCHOSCOPY WITH ENDOBRONCHIAL NAVIGATION;  Surgeon: Collene Gobble, MD;  Location: [facility] OR;  Service: Thoracic;  Laterality: N/A;     Current Meds  Medication Sig  . diphenoxylate-atropine (LOMOTIL) 2.5-0.025 MG tablet Take 1 tablet by mouth 2 (two) times a day.  . hydrALAZINE (APRESOLINE) 25 MG tablet Take 3 tablets (75 mg total) by mouth 2 (two) times a day.  . lipase/protease/amylase (CREON) 36000 UNITS CPEP capsule Take 1 capsule (36,000 Units total) by mouth  3 (three) times daily with meals.  . ondansetron (ZOFRAN) 4 MG tablet Take 1 tablet (4 mg total) by mouth every 8 (eight) hours as needed for nausea or vomiting.  . rivaroxaban (XARELTO) 20 MG TABS tablet Take 1 tablet (20 mg total) by mouth daily with supper. OK to restart this medication on 03/15/2018.     Allergies:   Phenergan [promethazine hcl], Amlodipine, Clarithromycin, Codeine, Erythromycin, Statins, and Tetracycline   Social History   Tobacco Use  . Smoking status: Former Smoker    Packs/day: 1.00    Years: 10.00    Pack years: 10.00    Quit date: 02/14/1974    Years since quitting: 44.5  . Smokeless tobacco: Never Used  Substance Use Topics  . Alcohol use: No  . Drug use: No     Family Hx: The patient's family history includes Arthritis in an other family member; Asthma in her mother and another family member; Breast cancer in an other family member; Breast cancer (age of onset: 60) in her maternal aunt; Heart disease in her mother and another family member; Hypertension in an other family member; Lung cancer  in her father; Parkinson's disease in her maternal grandmother; Rheumatic fever in her maternal grandfather.  ROS:   Please see the history of present illness.    All other systems reviewed and are negative.   Prior CV studies:   The following studies were reviewed today:  Labs/Other Tests and Data Reviewed:    EKG:  No ECG reviewed.  Recent Labs: 12/23/2017: B Natriuretic Peptide 40.2 08/09/2018: TSH 2.889 08/25/2018: Hemoglobin 10.5; Platelets 164 08/29/2018: Magnesium 2.1 09/03/2018: ALT 65; BUN 8; Creatinine, Ser 1.31; Potassium 3.8; Sodium 141   Recent Lipid Panel Lab Results  Component Value Date/Time   CHOL 179 09/03/2018 08:24 AM   TRIG 248 (H) 09/03/2018 08:24 AM   HDL 42 09/03/2018 08:24 AM   CHOLHDL 4.3 09/03/2018 08:24 AM   CHOLHDL 3.9 08/10/2018 06:50 AM   LDLCALC 87 09/03/2018 08:24 AM   LDLDIRECT 165.0 04/30/2018 09:08 AM    Wt Readings  from Last 3 Encounters:  09/07/18 130 lb (59 kg)  09/05/18 132 lb (59.9 kg)  08/28/18 140 lb 10.5 oz (63.8 kg)     Objective:    Vital Signs:  BP 123/76   Pulse 62   Wt 130 lb (59 kg)   SpO2 98% Comment: RA  BMI 24.56 kg/m    VITAL SIGNS:  reviewed     ASSESSMENT & PLAN:     1. Pulmonary embolism -on Xarelto, we will continue, this might be lifelong given that it was unprovoked, also there is unclear underlying lung malignancy. I will decrease Xarelto to 15 mg daily as she lost weight and her GFR is lower.  2.  Left lower lobe lung mass and other smaller masses, so far considered to be benign so far on PET and bronchoscopy, she was supposed to undergo another surgcal biopsy, however postponed sec to Covid pandemic.  3.  Mild nonobstructive CAD -and aortic atherosclerosis as well as microvascular disease on her brain imaging, she discontinued statins and does not want to start taking fish oil in the settings of chronic diarrhea and weight loss.  I agree with that..  4.  Hypertension -controlled with hydralazine.  5. Hyperlipidemia - as above we will hold for now and reevaluate at the next visit.  6.  Lower extremity erythema, bilateral with erythema, encouraged to wear compression socks, we will arrange for bilateral lower extremity duplex.  COVID-19 Education: The signs and symptoms of COVID-19 were discussed with the patient and how to seek care for testing (follow up with PCP or arrange E-visit).  The importance of social distancing was discussed today.  Time:   Today, I have spent 35 minutes with the patient with telehealth technology discussing the above problems.     Medication Adjustments/Labs and Tests Ordered: Current medicines are reviewed at length with the patient today.  Concerns regarding medicines are outlined above.   Tests Ordered: No orders of the defined types were placed in this encounter.   Medication Changes: No orders of the defined types  were placed in this encounter.   Disposition:  Follow up in 3 month(s)  Signed, Ena Dawley, MD  09/07/2018 9:01 AM    [place]

## 2018-09-05 ENCOUNTER — Encounter: Payer: Self-pay | Admitting: Internal Medicine

## 2018-09-05 ENCOUNTER — Other Ambulatory Visit: Payer: Self-pay | Admitting: *Deleted

## 2018-09-05 ENCOUNTER — Other Ambulatory Visit: Payer: Self-pay

## 2018-09-05 ENCOUNTER — Ambulatory Visit (INDEPENDENT_AMBULATORY_CARE_PROVIDER_SITE_OTHER): Payer: Medicare Other | Admitting: Internal Medicine

## 2018-09-05 VITALS — BP 128/70 | HR 66 | Temp 98.3°F | Wt 132.0 lb

## 2018-09-05 DIAGNOSIS — N179 Acute kidney failure, unspecified: Secondary | ICD-10-CM

## 2018-09-05 DIAGNOSIS — I10 Essential (primary) hypertension: Secondary | ICD-10-CM | POA: Diagnosis not present

## 2018-09-05 DIAGNOSIS — E119 Type 2 diabetes mellitus without complications: Secondary | ICD-10-CM

## 2018-09-05 DIAGNOSIS — K219 Gastro-esophageal reflux disease without esophagitis: Secondary | ICD-10-CM

## 2018-09-05 DIAGNOSIS — I2699 Other pulmonary embolism without acute cor pulmonale: Secondary | ICD-10-CM | POA: Diagnosis not present

## 2018-09-05 DIAGNOSIS — R112 Nausea with vomiting, unspecified: Secondary | ICD-10-CM

## 2018-09-05 DIAGNOSIS — R197 Diarrhea, unspecified: Secondary | ICD-10-CM

## 2018-09-05 MED ORDER — DIPHENOXYLATE-ATROPINE 2.5-0.025 MG PO TABS: 1.0000 | ORAL_TABLET | Freq: Two times a day (BID) | ORAL | 0 refills | Status: DC

## 2018-09-05 MED ORDER — ONDANSETRON HCL 4 MG PO TABS: 4.0000 mg | ORAL_TABLET | Freq: Three times a day (TID) | ORAL | 0 refills | Status: DC | PRN

## 2018-09-05 MED ORDER — HYDRALAZINE HCL 25 MG PO TABS: 75.0000 mg | ORAL_TABLET | Freq: Two times a day (BID) | ORAL | 1 refills | Status: DC

## 2018-09-05 NOTE — Patient Instructions (Addendum)
For your swelling feet.  Start wearing compression socks daily.  Decrease your sodium intake.  Elevate your legs.    Take the zofran as needed.  You can take pepcid twice daily.      Increase your fluids.      Follow up in 3 weeks

## 2018-09-05 NOTE — Assessment & Plan Note (Signed)
Blood pressure looks good today Continue hydralazine at current dose

## 2018-09-05 NOTE — Assessment & Plan Note (Signed)
Diet controlled Last A1c 6.4%

## 2018-09-05 NOTE — Assessment & Plan Note (Addendum)
Following with pulmonary and cardiology On Xarelto-Dr. [name] is prescribing No evidence of GI bleed-heme positive, CBC has been stable

## 2018-09-05 NOTE — Assessment & Plan Note (Signed)
Had kidney function retested yesterday-none by cardiology There is improvement, but still not normal She is trying to increase her fluids and will work harder on drinking more Hopefully her kidney function will improve, but there is a chance that it may not We will follow-up in 3 weeks weeks and we will repeat then

## 2018-09-05 NOTE — Assessment & Plan Note (Signed)
Taken off PPI due to acute kidney injury Currently taking Pepcid 20 mg once daily-advised that she can increase this Sees GI tomorrow and can discuss with Dr. Collene Mares

## 2018-09-05 NOTE — Assessment & Plan Note (Signed)
Still having nausea, no vomiting-renewed Zofran can take as needed Still having diarrhea, but yesterday her stools did start to become semi-formed We will see Dr. Deeann Saint did mention to her medication she may need to inject, but she does not think she will be able to inject the medication Taking Creon and Lomotil Management per Dr. Collene Mares

## 2018-09-06 ENCOUNTER — Telehealth: Payer: Self-pay

## 2018-09-06 NOTE — Telephone Encounter (Signed)
I called and spoke with patients and medications have been reviewed. Patient wanted to let Dr. [name] know that she has been in the hospital twice and they are not sure what was wrong with her. Patient has seen Dr. Quay Burow and Dr. Collene Mares and they do not recommend any new medications for patient. Her feet and ankles are red and swollen. When patient saw Dr. Quay Burow, she told patient that her pulse was not strong in her feet and she may need some testing done.

## 2018-09-06 NOTE — Telephone Encounter (Signed)
Will endorse these concerns to Dr [name] after working up and speaking to the pt tomorrow at her virtual visit at [employer]. Will send this message to Dr. [name] via staff message prior to her calling the pt.

## 2018-09-07 ENCOUNTER — Encounter: Payer: Self-pay | Admitting: Cardiology

## 2018-09-07 ENCOUNTER — Telehealth (INDEPENDENT_AMBULATORY_CARE_PROVIDER_SITE_OTHER): Payer: Medicare Other | Admitting: Cardiology

## 2018-09-07 ENCOUNTER — Other Ambulatory Visit: Payer: Self-pay

## 2018-09-07 VITALS — BP 123/76 | HR 62 | Wt 130.0 lb

## 2018-09-07 DIAGNOSIS — I2782 Chronic pulmonary embolism: Secondary | ICD-10-CM | POA: Diagnosis not present

## 2018-09-07 DIAGNOSIS — Z789 Other specified health status: Secondary | ICD-10-CM

## 2018-09-07 DIAGNOSIS — I2609 Other pulmonary embolism with acute cor pulmonale: Secondary | ICD-10-CM | POA: Diagnosis not present

## 2018-09-07 DIAGNOSIS — I739 Peripheral vascular disease, unspecified: Secondary | ICD-10-CM

## 2018-09-07 DIAGNOSIS — R600 Localized edema: Secondary | ICD-10-CM

## 2018-09-07 DIAGNOSIS — E785 Hyperlipidemia, unspecified: Secondary | ICD-10-CM

## 2018-09-07 DIAGNOSIS — I10 Essential (primary) hypertension: Secondary | ICD-10-CM

## 2018-09-07 DIAGNOSIS — R238 Other skin changes: Secondary | ICD-10-CM

## 2018-09-07 DIAGNOSIS — I2510 Atherosclerotic heart disease of native coronary artery without angina pectoris: Secondary | ICD-10-CM

## 2018-09-07 DIAGNOSIS — M7989 Other specified soft tissue disorders: Secondary | ICD-10-CM

## 2018-09-07 MED ORDER — RIVAROXABAN 15 MG PO TABS: 15.0000 mg | ORAL_TABLET | Freq: Every day | ORAL | 6 refills | Status: DC

## 2018-09-07 NOTE — Patient Instructions (Addendum)
Medication Instructions:   DECREASE YOUR XARELTO TO TAKING 15 MG BY MOUTH DAILY AT SUPPER TIME.  If you need a refill on your cardiac medications before your next appointment, please call your pharmacy.   .  Testing/Procedures:  Your physician has requested that you have a lower  extremity arterial duplex. This test is an ultrasound of the arteries in the legs. It looks at arterial blood flow in the legs. Allow one hour for Lower Arterial scans. There are no restrictions or special instructions   Follow-Up:  4 MONTH FOLLOW-UP WITH DR [name] AS A REGULAR OFFICE VISIT ON 01/16/2019 AT 9:20 AM

## 2018-09-07 NOTE — Addendum Note (Signed)
Addended by: Nuala Alpha on: 09/07/2018 09:55 AM   Modules accepted: Orders

## 2018-09-11 ENCOUNTER — Other Ambulatory Visit: Payer: Self-pay | Admitting: Cardiology

## 2018-09-11 DIAGNOSIS — I739 Peripheral vascular disease, unspecified: Secondary | ICD-10-CM

## 2018-09-11 DIAGNOSIS — M7989 Other specified soft tissue disorders: Secondary | ICD-10-CM

## 2018-09-12 ENCOUNTER — Other Ambulatory Visit: Payer: Self-pay | Admitting: Cardiology

## 2018-09-12 ENCOUNTER — Other Ambulatory Visit: Payer: Self-pay

## 2018-09-12 ENCOUNTER — Ambulatory Visit (HOSPITAL_COMMUNITY)
Admission: RE | Admit: 2018-09-12 | Discharge: 2018-09-12 | Disposition: A | Discharge status: Home / Self Care | Payer: Medicare Other | Source: Ambulatory Visit | Attending: Internal Medicine | Admitting: Internal Medicine

## 2018-09-12 DIAGNOSIS — I739 Peripheral vascular disease, unspecified: Secondary | ICD-10-CM

## 2018-09-12 DIAGNOSIS — M7989 Other specified soft tissue disorders: Secondary | ICD-10-CM

## 2018-09-12 DIAGNOSIS — R238 Other skin changes: Secondary | ICD-10-CM | POA: Insufficient documentation

## 2018-09-17 ENCOUNTER — Other Ambulatory Visit: Payer: Self-pay

## 2018-09-17 ENCOUNTER — Ambulatory Visit (INDEPENDENT_AMBULATORY_CARE_PROVIDER_SITE_OTHER): Payer: Medicare Other | Admitting: Internal Medicine

## 2018-09-17 ENCOUNTER — Encounter: Payer: Self-pay | Admitting: Internal Medicine

## 2018-09-17 DIAGNOSIS — I2782 Chronic pulmonary embolism: Secondary | ICD-10-CM

## 2018-09-17 DIAGNOSIS — R918 Other nonspecific abnormal finding of lung field: Secondary | ICD-10-CM | POA: Diagnosis not present

## 2018-09-17 NOTE — Patient Instructions (Addendum)
All that is recommended for now is a repeat CT chest in a year and I will place in our reminder file for this purpose and let Dr Lake Bells know so he can recommend any additional work up in the meantime.  MAI or MAC are interchangeable terms that probably your nodules

## 2018-09-17 NOTE — Progress Notes (Signed)
[name], female    DOB: 30-Oct-1945, 73 y.o.   MRN: [id number]   Brief patient profile:   26 yowf quit smoking 1976 with unprovoked PE 12/2017   Being followed by [name] for MPNs s/p bx with [name] last ov 03/20/18 a/p    no evidence of malignancy on the left lower lobe biopsy.  There were some atypical cells seen which in her particular case I think are due to an underlying inflammatory problem.  The differential diagnosis of the nodule is broad, including organizing pneumonia, less likely an atypical infection in the setting of [name] use.  I do not have a high index of suspicion of malignancy even with the atypical cells so I do not think she needs to proceed with a surgical lung biopsy right now.  However, we do need to keep a close eye on the nodule so we will plan on repeating a CT scan of her chest in April.  If there is any evidence of growth then she will need to have a surgical biopsy.    History of Present Illness  09/17/2018  Pulmonary/ 1st office eval/[name] / 2nd opinion as [name] occupied with inpt covid-19 duty Chief Complaint  Patient presents with  . Consult    [name] pt- 2nd opinion re mpns.   Dyspnea:  MMRC3 = can't walk 100 yards even at a slow pace at a flat grade s stopping due to sob  No longer using rollator as of < a week, still on PT/ OT Cough: none now   Sleep: props up on pillows/wedge due to bad gerd rx by [name] with pepcid /simethicone  SABA use: none.  No obvious day to day or daytime variability or assoc excess/ purulent sputum or mucus plugs or hemoptysis or cp or chest tightness, subjective wheeze or overt sinus  symptoms.   Sleeping as above without nocturnal  or early am exacerbation  of respiratory  c/o's or need for noct saba. Also denies any obvious fluctuation of symptoms with weather or environmental changes or other aggravating or alleviating factors except as outlined above   No unusual exposure hx or h/o childhood pna/  asthma or knowledge of premature birth.  Current Allergies, Complete Past Medical History, Past Surgical History, Family History, and Social History were reviewed in [name] record.  ROS  The following are not active complaints unless bolded Hoarseness, sore throat, dysphagia, dental problems, itching, sneezing,  nasal congestion or discharge of excess mucus or purulent secretions, ear ache,   fever, chills, sweats, unintended wt loss or wt gain, classically pleuritic or exertional cp,  orthopnea pnd or arm/hand swelling  or leg swelling, presyncope, palpitations, abdominal pain, anorexia, nausea, vomiting, diarrhea  or change in bowel habits or change in bladder habits, change in stools or change in urine, dysuria, hematuria,  rash, arthralgias, visual complaints, headache, numbness, weakness or ataxia or problems with walking or coordination,  change in mood or  memory.          Past Medical History:  Diagnosis Date  . ALLERGIC RHINITIS   . ANEMIA-NOS   . Arthritis   . ASTHMA   . Carpal tunnel syndrome   . COLONIC POLYPS, HX OF   . Coronary artery disease    "mild CAD" noted on 12/05/17 in coronary CT scan  . DIABETES MELLITUS, TYPE II    diet controlled  . GERD   . HYPERLIPIDEMIA   . HYPERTENSION   .  OSTEOPENIA   . PONV (postoperative nausea and vomiting)   . Psoriasis    severe, began soriatane 01/2012  . Rectal fissure   . Scoliosis     Outpatient Medications Prior to Visit  Medication Sig Dispense Refill  . famotidine (PEPCID) 20 MG tablet Take 40 mg by mouth 2 (two) times daily.     . hydrALAZINE (APRESOLINE) 25 MG tablet Take 3 tablets (75 mg total) by mouth 2 (two) times a day. 180 tablet 1  . lipase/protease/amylase (CREON) 36000 UNITS CPEP capsule Take 1 capsule (36,000 Units total) by mouth 3 (three) times daily with meals. 90 capsule 0  . ondansetron (ZOFRAN) 4 MG tablet Take 1 tablet (4 mg total) by mouth every 8 (eight) hours as needed  for nausea or vomiting. 30 tablet 0  . Rivaroxaban (XARELTO) 15 MG TABS tablet Take 1 tablet (15 mg total) by mouth daily with supper. 30 tablet 6  . diphenoxylate-atropine (LOMOTIL) 2.5-0.025 MG tablet Take 1 tablet by mouth 2 (two) times a day. 30 tablet 0         Objective:     BP 134/84 (BP Location: Left Arm, Cuff Size: Normal)   Pulse 70   Temp (!) 97.5 F (36.4 C) (Oral)   Ht _0  (1.549 m)   Wt 127 lb 6.4 oz (57.8 kg)   SpO2 100% Comment: on RA  BMI 24.07 kg/m   SpO2: 100 %(on RA)     HEENT: nl dentition, turbinates bilaterally, and oropharynx. Nl external ear canals without cough reflex   NECK :  without JVD/Nodes/TM/ nl carotid upstrokes bilaterally   LUNGS: no acc muscle use,  Nl contour chest which is clear to A and P bilaterally without cough on insp or exp maneuvers   CV:  RRR  no s3 or murmur or increase in P2, and elastic hose both legs s edema   ABD:  soft and nontender with nl inspiratory excursion in the supine position. No bruits or organomegaly appreciated, bowel sounds nl  MS:  Nl gait/ ext warm without deformities, calf tenderness, cyanosis or clubbing No obvious joint restrictions   SKIN: warm and dry without lesions    NEURO:  alert, approp, nl sensorium with  no motor or cerebellar deficits apparent.       I personally reviewed images and agree with radiology impression as follows:   Chest CT 08/15/2018 w/o contrast 1. Areas of peribronchovascular nodularity and nodular consolidation in the lungs bilaterally appear slightly improved from 12/22/2017. A benign post infectious/postinflammatory etiology is strongly favored. Follow-up CT chest without contrast in 1 year is recommended to ensure continued stability, as clinically indicated.       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   >>>>   I will defer to Caitlin Anastasia Pall judgement re the f/u ct rec in a year by radiology but I agrees with radiology that improvement strongly suggests a benign etiology.      Pulmonary embolism ([place])  12/22/2017 - unprovoke>> Lifelong anticoagulation rec  09/17/2018   Walked RA  2 laps @  approx 277f each @ moderat pace  stopped due to  End of study with min sob and sats 92%    Issue with doe suggest deconditioning > limiting sob so rec continue reconditioning, monitor sats at peak exertion and call if trending down   Total time devoted to counseling  > 50 % of initial 479m office consultation  reviewed case with pt/directly observing portions of  amb 02 saturation study extended face to face time for this visit    discussion of options/alternatives/ personally creating written customized instructions  in presence of pt  then going over those specific  Instructions directly with the pt including how to use all of the meds but in particular covering each new medication in detail and the difference between the maintenance= "automatic" meds and the prns using an action plan format for the latter (If this problem/symptom => do that organization reading Left to right).  Please see AVS from this visit for a full list of these instructions which I personally wrote for this pt and  are unique to this visit.      MiChristinia GullyMD 09/17/2018

## 2018-09-18 ENCOUNTER — Encounter: Payer: Self-pay | Admitting: Internal Medicine

## 2018-09-18 DIAGNOSIS — R918 Other nonspecific abnormal finding of lung field: Secondary | ICD-10-CM | POA: Insufficient documentation

## 2018-09-18 NOTE — Assessment & Plan Note (Addendum)
12/22/2017 - unprovoke>> Lifelong anticoagulation rec  09/17/2018   Walked RA  2 laps @  approx 249ft each @ moderat pace  stopped due to  End of study with min sob and sats 92%    Issue with doe suggest deconditioning > limiting sob so rec continue reconditioning, monitor sats at peak exertion and call if trending down   Total time devoted to counseling  > 50 % of initial 61min office consultation  reviewed case with pt/directly observing portions of  amb 02 saturation study extended face to face time for this visit    discussion of options/alternatives/ personally creating written customized instructions  in presence of pt  then going over those specific  Instructions directly with the pt including how to use all of the meds but in particular covering each new medication in detail and the difference between the maintenance= "automatic" meds and the prns using an action plan format for the latter (If this problem/symptom => do that organization reading Left to right).  Please see AVS from this visit for a full list of these instructions which I personally wrote for this pt and  are unique to this visit.

## 2018-09-18 NOTE — Assessment & Plan Note (Signed)
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   >>>>   I will defer to Dr Anastasia Pall judgement re the f/u ct rec in a year by radiology but I agrees with radiology that improvement strongly suggests a benign etiology.

## 2018-09-25 NOTE — Progress Notes (Signed)
Subjective:    Patient ID: [name], female    DOB: 02-Jun-1945, 73 y.o.   MRN: [id number]  HPI The patient is here for follow up.  She is walking every morning.  She has increased how much she has been able to walk.  Decreased GFR: She is concerned about her kidney function.  She denies any swelling in her lower legs.  She has minimal swelling in her left foot, but no swelling in her right foot.  She is compliant with a low-sodium diet.  She is drinking plenty of water throughout the day.  Hypertension: She is taking her medication daily. She is compliant with a low sodium diet.  She denies chest pain, palpitations, edema, shortness of breath and regular headaches. She does monitor her blood pressure at home:   bp at home 116/72 - 132/80 (left arm) and 114-71-135/74 ( right arm).    Bowels, pancreatic insufficiency: She is taking the pancreatic enzymes as prescribed.  Her bowels have been very soft, pencil like and light in color.  She will typically have 1 BM a day in the morning.  This morning she had lower abdominal cramping and 4 bowel movements.  She has not had the abdominal cramping prior to this morning.  She denies any blood in the stool, but has had some rectal bleeding when she was experiencing diarrhea, but not recently.   She has tried prune juice, glycerin suppositories, lomotil.  Her stools do not seem to be improving as far as consistency.  Her typical diet includes Breakfast - oatmeal and fruit Drinks - 1.5 cups of milk throughout day, lots of water Lunch -  Salad or chicken sandwich and fruit Snacks -  Low sodium popcorn Dinner - vegetable, pasta, small amount of meat Snack - a few cookies with very small amount of milk  Her appetite is still decreased.  She has been losing weight.  GERD:  She is taking simethicone with each meal, pepcid twice daily.  She takes occasional Tums.  She continues to have GERD.  She is avoiding prilosec because Dr Collene Mares advised it  may cause the diarrhea again.  In the past only Prilosec has helped with her GERD.    Medications and allergies reviewed with patient and updated if appropriate.  Patient Active Problem List   Diagnosis Date Noted  . Multiple pulmonary nodules determined by computed tomography of lung 09/18/2018  . Dehydration   . Decreased GFR 08/21/2018  . Acute lower UTI 08/21/2018  . Nausea vomiting and diarrhea 08/20/2018  . Syncope 08/01/2018  . Hypokalemia 08/01/2018  . Abnormal CT scan, pelvis 07/31/2018  . Pneumothorax 03/14/2018  . S/P bronchoscopy with biopsy   . Lung abnormality 01/11/2018  . Pulmonary embolism ([place]) 12/24/2017  . Lung mass 12/24/2017  . Small vessel disease, cerebrovascular 07/31/2017  . Atypical chest pain 06/14/2017  . Dizziness 06/14/2017  . Easy bruising 05/30/2017  . Hyperuricemia 05/03/2017  . Arthralgia 05/01/2017  . Hair loss 05/01/2017  . Vitamin D deficiency 10/31/2016  . Redness of skin, feet 10/31/2016  . Diarrhea 06/06/2016  . Diastolic dysfunction 53/29/9242  . Vertigo 12/24/2015  . Fatigue 12/24/2015  . Nonspecific abnormal electrocardiogram (ECG) (EKG) 12/24/2015  . Bilateral carotid artery disease, Mild 05/31/2015  . Thyroid nodule 05/31/2015  . Psoriasis   . Scoliosis   . Diabetes type 2, controlled ([place]) 06/03/2008  . Dyslipidemia 06/03/2008  . CARPAL TUNNEL SYNDROME 06/03/2008  . HTN (hypertension) 06/03/2008  . ALLERGIC  RHINITIS 06/03/2008  . Asthma 06/03/2008  . GERD (gastroesophageal reflux disease) 06/03/2008  . Osteopenia 06/03/2008  . COLONIC POLYPS, HX OF 06/03/2008    Current Outpatient Medications on File Prior to Visit  Medication Sig Dispense Refill  . Calcium Carbonate Antacid (TUMS CHEWY BITES PO) Take by mouth. After each meal    . diphenoxylate-atropine (LOMOTIL) 2.5-0.025 MG tablet Take 1 tablet by mouth 2 (two) times daily.    . famotidine (PEPCID) 20 MG tablet Take 40 mg by mouth 2 (two) times daily.     .  hydrALAZINE (APRESOLINE) 25 MG tablet Take 3 tablets (75 mg total) by mouth 2 (two) times a day. 180 tablet 1  . lipase/protease/amylase (CREON) 36000 UNITS CPEP capsule Take 1 capsule (36,000 Units total) by mouth 3 (three) times daily with meals. 90 capsule 0  . ondansetron (ZOFRAN) 4 MG tablet Take 1 tablet (4 mg total) by mouth every 8 (eight) hours as needed for nausea or vomiting. 30 tablet 0  . Rivaroxaban (XARELTO) 15 MG TABS tablet Take 1 tablet (15 mg total) by mouth daily with supper. 30 tablet 6  . Simethicone 180 MG CAPS Take by mouth 3 (three) times daily.     No current facility-administered medications on file prior to visit.     Past Medical History:  Diagnosis Date  . ALLERGIC RHINITIS   . ANEMIA-NOS   . Arthritis   . ASTHMA   . Carpal tunnel syndrome   . COLONIC POLYPS, HX OF   . Coronary artery disease    "mild CAD" noted on 12/05/17 in coronary CT scan  . DIABETES MELLITUS, TYPE II    diet controlled  . GERD   . HYPERLIPIDEMIA   . HYPERTENSION   . OSTEOPENIA   . PONV (postoperative nausea and vomiting)   . Psoriasis    severe, began soriatane 01/2012  . Rectal fissure   . Scoliosis     Past Surgical History:  Procedure Laterality Date  . benign rectal growth  2004   removed by Dr. Zella Richer  . BIOPSY  08/09/2018   Procedure: BIOPSY;  Surgeon: Carol Ada, MD;  Location: [facility] ENDOSCOPY;  Service: Endoscopy;;  . BIOPSY  08/22/2018   Procedure: BIOPSY;  Surgeon: Juanita Craver, MD;  Location: [facility] ENDOSCOPY;  Service: Endoscopy;;  . BREAST BIOPSY Right   . BREAST EXCISIONAL BIOPSY Left   . BREAST SURGERY  1988   biopsy  . CESAREAN SECTION    . COLONOSCOPY N/A 08/22/2018   Procedure: COLONOSCOPY;  Surgeon: Juanita Craver, MD;  Location[facility] ENDOSCOPY;  Service: Endoscopy;  Laterality: N/A;  . ESOPHAGOGASTRODUODENOSCOPY (EGD) WITH PROPOFOL N/A 08/04/2018   Procedure: ESOPHAGOGASTRODUODENOSCOPY (EGD) WITH PROPOFOL;  Surgeon: Ladene Artist, MD;  Location: [place];  Service: Gastroenterology;  Laterality: N/A;  . FLEXIBLE SIGMOIDOSCOPY N/A 08/04/2018   Procedure: FLEXIBLE SIGMOIDOSCOPY;  Surgeon: Ladene Artist, MD;  Location: [place];  Service: Gastroenterology;  Laterality: N/A;  . FLEXIBLE SIGMOIDOSCOPY N/A 08/09/2018   Procedure: FLEXIBLE SIGMOIDOSCOPY;  Surgeon: Carol Ada, MD;  Location: [place];  Service: Endoscopy;  Laterality: N/A;  . VIDEO BRONCHOSCOPY WITH ENDOBRONCHIAL NAVIGATION N/A 03/14/2018   Procedure: VIDEO BRONCHOSCOPY WITH ENDOBRONCHIAL NAVIGATION;  Surgeon: Collene Gobble, MD;  Location: [facility] OR;  Service: Thoracic;  Laterality: N/A;    Social History   Socioeconomic History  . Marital status: Single    Spouse name: Not on file  . Number of children: 1  . Years of education: 11  .  Highest education level: Bachelor's degree (e.g., BA, AB, BS)  Occupational History  . Occupation: retired Careers adviser  Social Needs  . Financial resource strain: Not on file  . Food insecurity    Worry: Not on file    Inability: Not on file  . Transportation needs    Medical: Not on file    Non-medical: Not on file  Tobacco Use  . Smoking status: Former Smoker    Packs/day: 1.00    Years: 10.00    Pack years: 10.00    Quit date: 02/14/1974    Years since quitting: 44.6  . Smokeless tobacco: Never Used  Substance and Sexual Activity  . Alcohol use: No  . Drug use: No  . Sexual activity: Not on file  Lifestyle  . Physical activity    Days per week: Not on file    Minutes per session: Not on file  . Stress: Not on file  Relationships  . Social Herbalist on phone: Not on file    Gets together: Not on file    Attends religious service: Not on file    Active member of club or organization: Not on file    Attends meetings of clubs or organizations: Not on file    Relationship status: Not on file  Other Topics Concern  . Not on file  Social History Narrative   Lives  alone in a one story home.  Has one child and one grandchild.  Retired Careers adviser.  Education: college.     Family History  Problem Relation Age of Onset  . Lung cancer Father   . Arthritis Other        Parents  . Asthma Other        parent, other relative  . Breast cancer Other        other relative  . Hypertension Other        parent, other relative  . Heart disease Other        parent, other relative  . Heart disease Mother   . Asthma Mother   . Breast cancer Maternal Aunt 68  . Parkinson's disease Maternal Grandmother   . Rheumatic fever Maternal Grandfather     Review of Systems  Constitutional: Positive for appetite change (decreased). Negative for chills, fatigue (energy level has been good until today) and fever.  Respiratory: Negative for cough, shortness of breath and wheezing.   Cardiovascular: Negative for chest pain, palpitations and leg swelling (resolved).  Gastrointestinal: Positive for abdominal distention, abdominal pain (cramping around BM's today only), anal bleeding (hemorrhoidal with diarrhea) and nausea (in morning). Negative for blood in stool, diarrhea (very loose, not formed stools) and vomiting.       GERD  Neurological: Positive for dizziness and headaches (rare).       Objective:   Vitals:   09/26/18 0908  BP: 134/84  Pulse: 73  Resp: 16  Temp: 98.6 F (37 C)  SpO2: 99%   BP Readings from Last 3 Encounters:  09/26/18 134/84  09/17/18 134/84  09/07/18 123/76   Wt Readings from Last 3 Encounters:  09/26/18 124 lb (56.2 kg)  09/17/18 127 lb 6.4 oz (57.8 kg)  09/07/18 130 lb (59 kg)   Body mass index is 23.43 kg/m.   Physical Exam    Constitutional: Appears well-developed and well-nourished. No distress.  HENT:  Head: Normocephalic and atraumatic.  Neck: Neck supple. No tracheal deviation  present. No thyromegaly present.  No cervical lymphadenopathy Cardiovascular: Normal rate, regular rhythm and  normal heart sounds.  No murmur heard. No carotid bruit .  Trace edema left dorsal foot, no other edema Pulmonary/Chest: Effort normal and breath sounds normal. No respiratory distress. No has no wheezes. No rales. Abdomen:  Soft, diffusely tender w/o rebound or guarding Skin: Skin is warm and dry. Not diaphoretic.  Psychiatric: Normal mood and affect. Behavior is normal.      Assessment & Plan:    See Problem List for Assessment and Plan of chronic medical problems.

## 2018-09-25 NOTE — Assessment & Plan Note (Addendum)
Gastroenteritis in June and July caused AKI - kidney function improved, but not back to normal as per her last blood work Recheck CMP today Also check magnesium, phosphorus, CBC She is drinking a lot of water She is taking her BP medication-BP well controlled

## 2018-09-26 ENCOUNTER — Ambulatory Visit (INDEPENDENT_AMBULATORY_CARE_PROVIDER_SITE_OTHER): Payer: Medicare Other | Admitting: Internal Medicine

## 2018-09-26 ENCOUNTER — Encounter: Payer: Self-pay | Admitting: Internal Medicine

## 2018-09-26 ENCOUNTER — Other Ambulatory Visit: Payer: Self-pay

## 2018-09-26 ENCOUNTER — Other Ambulatory Visit (INDEPENDENT_AMBULATORY_CARE_PROVIDER_SITE_OTHER): Payer: Medicare Other

## 2018-09-26 VITALS — BP 134/84 | HR 73 | Temp 98.6°F | Resp 16 | Ht 61.0 in | Wt 124.0 lb

## 2018-09-26 DIAGNOSIS — I10 Essential (primary) hypertension: Secondary | ICD-10-CM

## 2018-09-26 DIAGNOSIS — E119 Type 2 diabetes mellitus without complications: Secondary | ICD-10-CM

## 2018-09-26 DIAGNOSIS — R197 Diarrhea, unspecified: Secondary | ICD-10-CM

## 2018-09-26 DIAGNOSIS — R944 Abnormal results of kidney function studies: Secondary | ICD-10-CM | POA: Diagnosis not present

## 2018-09-26 DIAGNOSIS — K219 Gastro-esophageal reflux disease without esophagitis: Secondary | ICD-10-CM | POA: Diagnosis not present

## 2018-09-26 LAB — COMPREHENSIVE METABOLIC PANEL
ALT: 14 U/L (ref 0–35)
AST: 14 U/L (ref 0–37)
Albumin: 4.3 g/dL (ref 3.5–5.2)
Alkaline Phosphatase: 68 U/L (ref 39–117)
BUN: 14 mg/dL (ref 6–23)
CO2: 27 mEq/L (ref 19–32)
Calcium: 10.5 mg/dL (ref 8.4–10.5)
Chloride: 103 mEq/L (ref 96–112)
Creatinine, Ser: 1.07 mg/dL (ref 0.40–1.20)
GFR: 50 mL/min — ABNORMAL LOW (ref >60.00)
Glucose, Bld: 99 mg/dL (ref 70–99)
Potassium: 4 mEq/L (ref 3.5–5.1)
Sodium: 141 mEq/L (ref 135–145)
Total Bilirubin: 0.6 mg/dL (ref 0.2–1.2)
Total Protein: 7.5 g/dL (ref 6.0–8.3)

## 2018-09-26 LAB — CBC WITH DIFFERENTIAL/PLATELET
Basophils Absolute: 0.1 10*3/uL (ref 0.0–0.1)
Basophils Relative: 1.3 % (ref 0.0–3.0)
Eosinophils Absolute: 0.2 10*3/uL (ref 0.0–0.7)
Eosinophils Relative: 2.7 % (ref 0.0–5.0)
HCT: 36.9 % (ref 36.0–46.0)
Hemoglobin: 12.1 g/dL (ref 12.0–15.0)
Lymphocytes Relative: 17.1 % (ref 12.0–46.0)
Lymphs Abs: 1.3 10*3/uL (ref 0.7–4.0)
MCHC: 32.8 g/dL (ref 30.0–36.0)
MCV: 95.3 fl (ref 78.0–100.0)
Monocytes Absolute: 0.6 10*3/uL (ref 0.1–1.0)
Monocytes Relative: 8.3 % (ref 3.0–12.0)
Neutro Abs: 5.4 10*3/uL (ref 1.4–7.7)
Neutrophils Relative %: 70.6 % (ref 43.0–77.0)
Platelets: 281 10*3/uL (ref 150.0–400.0)
RBC: 3.88 Mil/uL (ref 3.87–5.11)
RDW: 14.2 % (ref 11.5–15.5)
WBC: 7.7 10*3/uL (ref 4.0–10.5)

## 2018-09-26 LAB — MAGNESIUM: Magnesium: 1.8 mg/dL (ref 1.5–2.5)

## 2018-09-26 LAB — IRON,TIBC AND FERRITIN PANEL
%SAT: 42 % (calc) (ref 16–45)
Ferritin: 93 ng/mL (ref 16–288)
Iron: 128 ug/dL (ref 45–160)
TIBC: 308 mcg/dL (calc) (ref 250–450)

## 2018-09-26 LAB — PHOSPHORUS: Phosphorus: 2.8 mg/dL (ref 2.3–4.6)

## 2018-09-26 LAB — HEMOGLOBIN A1C: Hgb A1c MFr Bld: 5.8 % (ref 4.6–6.5)

## 2018-09-26 NOTE — Assessment & Plan Note (Signed)
Diet controlled Very compliant with a diabetic diet for the most part Will check A1c

## 2018-09-26 NOTE — Assessment & Plan Note (Signed)
Blood pressure very well controlled Continue current medication at current dose Continue to monitor at home CMP

## 2018-09-26 NOTE — Assessment & Plan Note (Signed)
Stools are more formed and she is not having true diarrhea-very soft, pencillike stools True cause of diarrhea not known ?  Improvement with pancreatic enzymes-she will continue Following with Dr. Collene Mares She will start experimenting a little-advised trying supplemental fiber-has tolerated FiberCon in the past We will check celiac panel-she has had issues since she was younger and it could be a gluten sensitivity.  Discussed the test is not 100%

## 2018-09-26 NOTE — Patient Instructions (Signed)
  Tests ordered today. Your results will be released to [place] (or called to you) after review.  If any changes need to be made, you will be notified at that same time.   Medications reviewed and updated.  Changes include :   none    Please followup in 1 month as scheduled

## 2018-09-26 NOTE — Assessment & Plan Note (Signed)
Still having daily GERD and nausea, which is likely GERD related Taking Pepcid, simethicone and Tums as needed Dr. Collene Mares would prefer her not to start the omeprazole, but she may need to take a low dose every other day to try to get her heartburn controlled We will check kidney function to see where we are

## 2018-10-22 NOTE — Patient Instructions (Addendum)
  Tests ordered today. Your results will be released to [place] (or called to you) after review.  If any changes need to be made, you will be notified at that same time.  Flu immunization administered today.    Medications reviewed and updated.  Changes include :   none    Please followup in 2-3 months

## 2018-10-22 NOTE — Progress Notes (Signed)
Subjective:    Patient ID: [name], female    DOB: August 16, 1945, 73 y.o.   MRN: [id number]  HPI The patient is here for follow up.  She is walking daily for exercise.   She has been having headaches every other day - it is a strong ache.  It is on the left side from the posterior left neck to the left eye.  If she massages the area on her neck it helps.  Tylenol has not helped.  She is not able to take NSAIDs.  Starting last night she has a painful sore spot on one toenail.  She denies any change in the skin.  She does have psoriasis and wonders if that is the cause of the pain.  She plans on watching it carefully.  Hypertension: She is taking her medication daily. She is compliant with a low sodium diet.  She denies chest pain, palpitations. She does monitor her blood pressure at home - 108/60 - 153/92.    GERD:  She is taking her medication daily as prescribed.  She denies any GERD symptoms and feels her GERD is well controlled.  It improved after restarting the prilosec.  Diarrhea, pancreatic insufficiency:  She is taking the pancreatic enzymes as prescribed.  She has 0-3 soft, thin BM daily.  She denies diarrhea.  She denies blood in stool.  She occasional abdominal cramping and occasional discomfort after a BM that lasts 20-30 min.    Decreased  GFR:  She is drinking a lot of water daily.  She does not take any nsaids.  She has noticed some swelling in her left foot and was concerned it was an indication that her kidney function had gotten worse.   Medications and allergies reviewed with patient and updated if appropriate.  Patient Active Problem List   Diagnosis Date Noted  . Multiple pulmonary nodules determined by computed tomography of lung 09/18/2018  . Decreased GFR 08/21/2018  . Nausea vomiting and diarrhea 08/20/2018  . Syncope 08/01/2018  . Hypokalemia 08/01/2018  . Abnormal CT scan, pelvis 07/31/2018  . Pneumothorax 03/14/2018  . S/P bronchoscopy with  biopsy   . Lung abnormality 01/11/2018  . Pulmonary embolism ([place]) 12/24/2017  . Lung mass 12/24/2017  . Small vessel disease, cerebrovascular 07/31/2017  . Atypical chest pain 06/14/2017  . Dizziness 06/14/2017  . Easy bruising 05/30/2017  . Hyperuricemia 05/03/2017  . Arthralgia 05/01/2017  . Hair loss 05/01/2017  . Vitamin D deficiency 10/31/2016  . Redness of skin, feet 10/31/2016  . Diarrhea 06/06/2016  . Diastolic dysfunction 03/70/4888  . Vertigo 12/24/2015  . Fatigue 12/24/2015  . Nonspecific abnormal electrocardiogram (ECG) (EKG) 12/24/2015  . Bilateral carotid artery disease, Mild 05/31/2015  . Thyroid nodule 05/31/2015  . Psoriasis   . Scoliosis   . Diabetes type 2, controlled ([place]) 06/03/2008  . Dyslipidemia 06/03/2008  . CARPAL TUNNEL SYNDROME 06/03/2008  . HTN (hypertension) 06/03/2008  . ALLERGIC RHINITIS 06/03/2008  . Asthma 06/03/2008  . GERD (gastroesophageal reflux disease) 06/03/2008  . Osteopenia 06/03/2008  . COLONIC POLYPS, HX OF 06/03/2008    Current Outpatient Medications on File Prior to Visit  Medication Sig Dispense Refill  . Calcium Carbonate Antacid (TUMS CHEWY BITES PO) Take by mouth. After each meal    . hydrALAZINE (APRESOLINE) 25 MG tablet Take 3 tablets (75 mg total) by mouth 2 (two) times a day. 180 tablet 1  . lipase/protease/amylase (CREON) 36000 UNITS CPEP capsule Take 1 capsule (36,000 Units total)  by mouth 3 (three) times daily with meals. 90 capsule 0  . omeprazole (PRILOSEC) 20 MG capsule Take 20 mg by mouth daily.    . ondansetron (ZOFRAN) 4 MG tablet Take 1 tablet (4 mg total) by mouth every 8 (eight) hours as needed for nausea or vomiting. 30 tablet 0  . Rivaroxaban (XARELTO) 15 MG TABS tablet Take 1 tablet (15 mg total) by mouth daily with supper. 30 tablet 6   No current facility-administered medications on file prior to visit.     Past Medical History:  Diagnosis Date  . ALLERGIC RHINITIS   . ANEMIA-NOS   . Arthritis    . ASTHMA   . Carpal tunnel syndrome   . COLONIC POLYPS, HX OF   . Coronary artery disease    "mild CAD" noted on 12/05/17 in coronary CT scan  . DIABETES MELLITUS, TYPE II    diet controlled  . GERD   . HYPERLIPIDEMIA   . HYPERTENSION   . OSTEOPENIA   . PONV (postoperative nausea and vomiting)   . Psoriasis    severe, began soriatane 01/2012  . Rectal fissure   . Scoliosis     Past Surgical History:  Procedure Laterality Date  . benign rectal growth  2004   removed by Dr. Zella Richer  . BIOPSY  08/09/2018   Procedure: BIOPSY;  Surgeon: Carol Ada, MD;  Location: [facility] ENDOSCOPY;  Service: Endoscopy;;  . BIOPSY  08/22/2018   Procedure: BIOPSY;  Surgeon: Juanita Craver, MD;  Location: [facility] ENDOSCOPY;  Service: Endoscopy;;  . BREAST BIOPSY Right   . BREAST EXCISIONAL BIOPSY Left   . BREAST SURGERY  1988   biopsy  . CESAREAN SECTION    . COLONOSCOPY N/A 08/22/2018   Procedure: COLONOSCOPY;  Surgeon: Juanita Craver, MD;  Location: [facility] ENDOSCOPY;  Service: Endoscopy;  Laterality: N/A;  . ESOPHAGOGASTRODUODENOSCOPY (EGD) WITH PROPOFOL N/A 08/04/2018   Procedure: ESOPHAGOGASTRODUODENOSCOPY (EGD) WITH PROPOFOL;  Surgeon: Ladene Artist, MD;  Location: [place];  Service: Gastroenterology;  Laterality: N/A;  . FLEXIBLE SIGMOIDOSCOPY N/A 08/04/2018   Procedure: FLEXIBLE SIGMOIDOSCOPY;  Surgeon: Ladene Artist, MD;  Location: [place];  Service: Gastroenterology;  Laterality: N/A;  . FLEXIBLE SIGMOIDOSCOPY N/A 08/09/2018   Procedure: FLEXIBLE SIGMOIDOSCOPY;  Surgeon: Carol Ada, MD;  Location: [place];  Service: Endoscopy;  Laterality: N/A;  . VIDEO BRONCHOSCOPY WITH ENDOBRONCHIAL NAVIGATION N/A 03/14/2018   Procedure: VIDEO BRONCHOSCOPY WITH ENDOBRONCHIAL NAVIGATION;  Surgeon: Collene Gobble, MD;  Location: [facility] OR;  Service: Thoracic;  Laterality: N/A;    Social History   Socioeconomic History  . Marital status: Single    Spouse name: Not on file  . Number of children: 1  .  Years of education: 20  . Highest education level: Bachelor's degree (e.g., BA, AB, BS)  Occupational History  . Occupation: retired Careers adviser  Social Needs  . Financial resource strain: Not on file  . Food insecurity    Worry: Not on file    Inability: Not on file  . Transportation needs    Medical: Not on file    Non-medical: Not on file  Tobacco Use  . Smoking status: Former Smoker    Packs/day: 1.00    Years: 10.00    Pack years: 10.00    Quit date: 02/14/1974    Years since quitting: 44.7  . Smokeless tobacco: Never Used  Substance and Sexual Activity  . Alcohol use: No  . Drug use: No  .  Sexual activity: Not on file  Lifestyle  . Physical activity    Days per week: Not on file    Minutes per session: Not on file  . Stress: Not on file  Relationships  . Social Herbalist on phone: Not on file    Gets together: Not on file    Attends religious service: Not on file    Active member of club or organization: Not on file    Attends meetings of clubs or organizations: Not on file    Relationship status: Not on file  Other Topics Concern  . Not on file  Social History Narrative   Lives alone in a one story home.  Has one child and one grandchild.  Retired Careers adviser.  Education: college.     Family History  Problem Relation Age of Onset  . Lung cancer Father   . Arthritis Other        Parents  . Asthma Other        parent, other relative  . Breast cancer Other        other relative  . Hypertension Other        parent, other relative  . Heart disease Other        parent, other relative  . Heart disease Mother   . Asthma Mother   . Breast cancer Maternal Aunt 68  . Parkinson's disease Maternal Grandmother   . Rheumatic fever Maternal Grandfather     Review of Systems  Constitutional: Negative for fever.  Respiratory: Positive for shortness of breath (occ after walking one mile).  Negative for cough and wheezing.   Cardiovascular: Positive for leg swelling (foot swelling). Negative for chest pain and palpitations.  Gastrointestinal: Positive for abdominal pain and nausea (today only). Negative for blood in stool, constipation and diarrhea.  Neurological: Positive for dizziness (occ) and headaches.       Objective:   Vitals:   10/23/18 0830  BP: (!) 174/92  Pulse: 77  Resp: 16  Temp: 98.2 F (36.8 C)  SpO2: 99%   BP Readings from Last 3 Encounters:  10/23/18 (!) 174/92  09/26/18 134/84  09/17/18 134/84   Wt Readings from Last 3 Encounters:  10/23/18 126 lb 12.8 oz (57.5 kg)  09/26/18 124 lb (56.2 kg)  09/17/18 127 lb 6.4 oz (57.8 kg)   Body mass index is 23.96 kg/m.   Physical Exam    Constitutional: Appears well-developed and well-nourished. No distress.  HENT:  Head: Normocephalic and atraumatic.  Neck: Neck supple. No tracheal deviation present. No thyromegaly present.  No cervical lymphadenopathy Cardiovascular: Normal rate, regular rhythm and normal heart sounds.   No murmur heard. No carotid bruit .  Trace left dorsal foot edema.  No bilateral ankle swelling Pulmonary/Chest: Effort normal and breath sounds normal. No respiratory distress. No has no wheezes. No rales.  Skin: Skin is warm and dry. Not diaphoretic.  Left toes and distal foot chronically bright red-likely from chronic steroid use.  No lesion, cut left fifth toe-toenail and her fifth toe slightly tender-possibly related to psoriasis-appears normal Psychiatric: Normal mood and affect. Behavior is normal.      Assessment & Plan:    See Problem List for Assessment and Plan of chronic medical problems.

## 2018-10-23 ENCOUNTER — Encounter: Payer: Self-pay | Admitting: Internal Medicine

## 2018-10-23 ENCOUNTER — Ambulatory Visit (INDEPENDENT_AMBULATORY_CARE_PROVIDER_SITE_OTHER): Payer: Medicare Other | Admitting: Internal Medicine

## 2018-10-23 ENCOUNTER — Other Ambulatory Visit: Payer: Self-pay

## 2018-10-23 ENCOUNTER — Other Ambulatory Visit (INDEPENDENT_AMBULATORY_CARE_PROVIDER_SITE_OTHER): Payer: Medicare Other

## 2018-10-23 VITALS — BP 174/92 | HR 77 | Temp 98.2°F | Resp 16 | Ht 61.0 in | Wt 126.8 lb

## 2018-10-23 DIAGNOSIS — R944 Abnormal results of kidney function studies: Secondary | ICD-10-CM

## 2018-10-23 DIAGNOSIS — Z23 Encounter for immunization: Secondary | ICD-10-CM

## 2018-10-23 DIAGNOSIS — K8681 Exocrine pancreatic insufficiency: Secondary | ICD-10-CM | POA: Insufficient documentation

## 2018-10-23 DIAGNOSIS — E785 Hyperlipidemia, unspecified: Secondary | ICD-10-CM

## 2018-10-23 DIAGNOSIS — E119 Type 2 diabetes mellitus without complications: Secondary | ICD-10-CM | POA: Diagnosis not present

## 2018-10-23 DIAGNOSIS — K8689 Other specified diseases of pancreas: Secondary | ICD-10-CM

## 2018-10-23 DIAGNOSIS — I10 Essential (primary) hypertension: Secondary | ICD-10-CM | POA: Diagnosis not present

## 2018-10-23 DIAGNOSIS — K219 Gastro-esophageal reflux disease without esophagitis: Secondary | ICD-10-CM

## 2018-10-23 DIAGNOSIS — R195 Other fecal abnormalities: Secondary | ICD-10-CM

## 2018-10-23 LAB — COMPREHENSIVE METABOLIC PANEL
ALT: 36 U/L — ABNORMAL HIGH (ref 0–35)
AST: 24 U/L (ref 0–37)
Albumin: 4.5 g/dL (ref 3.5–5.2)
Alkaline Phosphatase: 152 U/L — ABNORMAL HIGH (ref 39–117)
BUN: 12 mg/dL (ref 6–23)
CO2: 28 mEq/L (ref 19–32)
Calcium: 10.1 mg/dL (ref 8.4–10.5)
Chloride: 102 mEq/L (ref 96–112)
Creatinine, Ser: 1.05 mg/dL (ref 0.40–1.20)
GFR: 51.09 mL/min — ABNORMAL LOW (ref >60.00)
Glucose, Bld: 115 mg/dL — ABNORMAL HIGH (ref 70–99)
Potassium: 3.7 mEq/L (ref 3.5–5.1)
Sodium: 140 mEq/L (ref 135–145)
Total Bilirubin: 0.8 mg/dL (ref 0.2–1.2)
Total Protein: 8.1 g/dL (ref 6.0–8.3)

## 2018-10-23 LAB — CBC WITH DIFFERENTIAL/PLATELET
Basophils Absolute: 0.1 10*3/uL (ref 0.0–0.1)
Basophils Relative: 1.3 % (ref 0.0–3.0)
Eosinophils Absolute: 0.1 10*3/uL (ref 0.0–0.7)
Eosinophils Relative: 1.5 % (ref 0.0–5.0)
HCT: 37 % (ref 36.0–46.0)
Hemoglobin: 12.4 g/dL (ref 12.0–15.0)
Lymphocytes Relative: 14.3 % (ref 12.0–46.0)
Lymphs Abs: 1.4 10*3/uL (ref 0.7–4.0)
MCHC: 33.5 g/dL (ref 30.0–36.0)
MCV: 94.1 fl (ref 78.0–100.0)
Monocytes Absolute: 0.6 10*3/uL (ref 0.1–1.0)
Monocytes Relative: 5.8 % (ref 3.0–12.0)
Neutro Abs: 7.3 10*3/uL (ref 1.4–7.7)
Neutrophils Relative %: 77.1 % — ABNORMAL HIGH (ref 43.0–77.0)
Platelets: 363 10*3/uL (ref 150.0–400.0)
RBC: 3.93 Mil/uL (ref 3.87–5.11)
RDW: 14.3 % (ref 11.5–15.5)
WBC: 9.5 10*3/uL (ref 4.0–10.5)

## 2018-10-23 LAB — LIPID PANEL
Cholesterol: 256 mg/dL — ABNORMAL HIGH (ref 0–200)
HDL: 52.3 mg/dL (ref >39.00)
LDL Cholesterol: 171 mg/dL — ABNORMAL HIGH (ref 0–99)
NonHDL: 203.23
Total CHOL/HDL Ratio: 5
Triglycerides: 161 mg/dL — ABNORMAL HIGH (ref 0.0–149.0)
VLDL: 32.2 mg/dL (ref 0.0–40.0)

## 2018-10-23 NOTE — Assessment & Plan Note (Signed)
Blood pressure elevated here today, but well controlled at home She will continue to monitor at home Continue current medication at current dose CMP

## 2018-10-23 NOTE — Assessment & Plan Note (Signed)
Lab Results  Component Value Date   HGBA1C 5.8 09/26/2018   Sugars have been well controlled with diet We will recheck at her next visit

## 2018-10-23 NOTE — Assessment & Plan Note (Signed)
Has not been taking either cholesterol-lowering medication Will check lipid panel

## 2018-10-23 NOTE — Assessment & Plan Note (Signed)
Following with Dr. Collene Mares Taking the pancreatic enzymes as prescribed There has been improvement in her stool, but still having soft, thin stools Will follow-up with Dr. Collene Mares next month She was concerned about possible celiac disease-we will check blood work ?  May need a higher dose of pancreatic enzymes

## 2018-10-23 NOTE — Assessment & Plan Note (Signed)
Controlled now that she is back on Prilosec

## 2018-10-23 NOTE — Assessment & Plan Note (Signed)
CMP She has had some mild left foot swelling, but urine is normal and typically clear-pale yellow She is drinking plenty of fluids She does not take any NSAIDs

## 2018-10-26 LAB — TISSUE TRANSGLUTAMINASE, IGA: (tTG) Ab, IgA: 1 U/mL

## 2018-10-26 LAB — GLIADIN ANTIBODIES, SERUM
Gliadin IgA: 11 Units
Gliadin IgG: 2 Units

## 2018-10-26 LAB — RETICULIN ANTIBODIES, IGA W TITER: Reticulin IGA Screen: NEGATIVE

## 2018-11-23 ENCOUNTER — Other Ambulatory Visit: Payer: Self-pay | Admitting: Internal Medicine

## 2018-11-28 ENCOUNTER — Other Ambulatory Visit: Payer: Self-pay | Admitting: Internal Medicine

## 2018-12-18 NOTE — Progress Notes (Signed)
Subjective:    Patient ID: [name], female    DOB: 04-12-1945, 73 y.o.   MRN: [id number]  HPI The patient is here for follow up.   Joint pain:  On Oct 16 she starting having horrible joint pain.  Her fingers, hands, wrists, shoulder, neck. The past few days her pain feels like a bad chemical burn.  She has difficulty brushing her teeth and doing her hair.  Her arms hurt.   She can not tell if her muscle hurt or not. She is taking tylenol.  She is taking advil because the pain hurts so much, but knows she should not be taking it due to being on xarelto, but the pain is severe.  She started putting on topical steroid. Four days her fingers started to swell and they have now turned red.  She has psoriasis.  She was on a biologic several months ago, but she stopped that and has not been on it since.  She was on it for the psoriasis only-it was prescribed by her dermatologist.  She has had a few medical issues since then and he has not wanted to put her back on it.  The psoriasis has been okay.  She does have a history of a positive ANA and for a while it was thought that she had lupus.  She was on some medication for that years ago.  She is unsure if she did actually have lupus or not.  Hypertension: She is taking her medication daily. She is compliant with a low sodium diet.  She denies chest pain, palpitations, shortness of breath and regular headaches. She does monitor her blood pressure at home.   Diabetes: She is controlling her sugars with diet. She is compliant with a diabetic diet.   Hyperlipidemia:  She is not currently on any medication.     Diarrhea, pancreatic insufficiency:  She is following with Dr Collene Mares.  She is taking the pancreatic enzymes.  She states constipation.  She does have nausea frequently and her stomach just does not feel right.  Decreased GFR:  She is drinking plenty of water.   She has been taking Advil for the joint pain.  Medications and allergies  reviewed with patient and updated if appropriate.  Patient Active Problem List   Diagnosis Date Noted  . Pancreatic insufficiency 10/23/2018  . Multiple pulmonary nodules determined by computed tomography of lung 09/18/2018  . Decreased GFR 08/21/2018  . Nausea vomiting and diarrhea 08/20/2018  . Syncope 08/01/2018  . Hypokalemia 08/01/2018  . Abnormal CT scan, pelvis 07/31/2018  . Pneumothorax 03/14/2018  . S/P bronchoscopy with biopsy   . Lung abnormality 01/11/2018  . Pulmonary embolism ([place]) 12/24/2017  . Lung mass 12/24/2017  . Small vessel disease, cerebrovascular 07/31/2017  . Atypical chest pain 06/14/2017  . Dizziness 06/14/2017  . Hyperuricemia 05/03/2017  . Arthralgia 05/01/2017  . Hair loss 05/01/2017  . Vitamin D deficiency 10/31/2016  . Redness of skin, feet 10/31/2016  . Diarrhea 06/06/2016  . Diastolic dysfunction 25/36/6440  . Vertigo 12/24/2015  . Nonspecific abnormal electrocardiogram (ECG) (EKG) 12/24/2015  . Bilateral carotid artery disease, Mild 05/31/2015  . Thyroid nodule 05/31/2015  . Psoriasis   . Scoliosis   . Diabetes type 2, controlled ([place]) 06/03/2008  . Dyslipidemia 06/03/2008  . CARPAL TUNNEL SYNDROME 06/03/2008  . HTN (hypertension) 06/03/2008  . ALLERGIC RHINITIS 06/03/2008  . Asthma 06/03/2008  . GERD (gastroesophageal reflux disease) 06/03/2008  . Osteopenia 06/03/2008  .  COLONIC POLYPS, HX OF 06/03/2008    Current Outpatient Medications on File Prior to Visit  Medication Sig Dispense Refill  . Calcium Carbonate Antacid (TUMS CHEWY BITES PO) Take by mouth. After each meal    . hydrALAZINE (APRESOLINE) 25 MG tablet TAKE 3 TABLETS TWICE A DAY 180 tablet 1  . lipase/protease/amylase (CREON) 36000 UNITS CPEP capsule Take 1 capsule (36,000 Units total) by mouth 3 (three) times daily with meals. 90 capsule 0  . omeprazole (PRILOSEC) 20 MG capsule Take 20 mg by mouth daily.    . ondansetron (ZOFRAN) 4 MG tablet Take 1 tablet (4 mg total)  by mouth every 8 (eight) hours as needed for nausea or vomiting. 30 tablet 0  . ONETOUCH ULTRA test strip USE TEST STRIPS TO CHECK BLOOD SUGAR AT LEAST 3 TIMES DAILY 300 strip 1  . Rivaroxaban (XARELTO) 15 MG TABS tablet Take 1 tablet (15 mg total) by mouth daily with supper. (Patient taking differently: Take 15 mg by mouth every other day. ) 30 tablet 6   No current facility-administered medications on file prior to visit.     Past Medical History:  Diagnosis Date  . ALLERGIC RHINITIS   . ANEMIA-NOS   . Arthritis   . ASTHMA   . Carpal tunnel syndrome   . COLONIC POLYPS, HX OF   . Coronary artery disease    "mild CAD" noted on 12/05/17 in coronary CT scan  . DIABETES MELLITUS, TYPE II    diet controlled  . GERD   . HYPERLIPIDEMIA   . HYPERTENSION   . OSTEOPENIA   . PONV (postoperative nausea and vomiting)   . Psoriasis    severe, began soriatane 01/2012  . Rectal fissure   . Scoliosis     Past Surgical History:  Procedure Laterality Date  . benign rectal growth  2004   removed by Dr. Zella Richer  . BIOPSY  08/09/2018   Procedure: BIOPSY;  Surgeon: Carol Ada, MD;  Location: [facility] ENDOSCOPY;  Service: Endoscopy;;  . BIOPSY  08/22/2018   Procedure: BIOPSY;  Surgeon: Juanita Craver, MD;  Location: [facility]COPY;  Service: Endoscopy;;  . BREAST BIOPSY Right   . BREAST EXCISIONAL BIOPSY Left   . BREAST SURGERY  1988   biopsy  . CESAREAN SECTION    . COLONOSCOPY N/A 08/22/2018   Procedure: COLONOSCOPY;  Surgeon: Juanita Craver, MD;  Location: [facility] ENDOSCOPY;  Service: Endoscopy;  Laterality: N/A;  . ESOPHAGOGASTRODUODENOSCOPY (EGD) WITH PROPOFOL N/A 08/04/2018   Procedure: ESOPHAGOGASTRODUODENOSCOPY (EGD) WITH PROPOFOL;  Surgeon: Ladene Artist, MD;  Location: [place];  Service: Gastroenterology;  Laterality: N/A;  . FLEXIBLE SIGMOIDOSCOPY N/A 08/04/2018   Procedure: FLEXIBLE SIGMOIDOSCOPY;  Surgeon: Ladene Artist, MD;  Location: [place];  Service: Gastroenterology;   Laterality: N/A;  . FLEXIBLE SIGMOIDOSCOPY N/A 08/09/2018   Procedure: FLEXIBLE SIGMOIDOSCOPY;  Surgeon: Carol Ada, MD;  Location: [place];  Service: Endoscopy;  Laterality: N/A;  . VIDEO BRONCHOSCOPY WITH ENDOBRONCHIAL NAVIGATION N/A 03/14/2018   Procedure: VIDEO BRONCHOSCOPY WITH ENDOBRONCHIAL NAVIGATION;  Surgeon: Collene Gobble, MD;  Location: [facility] OR;  Service: Thoracic;  Laterality: N/A;    Social History   Socioeconomic History  . Marital status: Single    Spouse name: Not on file  . Number of children: 1  . Years of education: 17  . Highest education level: Bachelor's degree (e.g., BA, AB, BS)  Occupational History  . Occupation: retired Careers adviser  Social Needs  . Emergency planning/management officer  strain: Not on file  . Food insecurity    Worry: Not on file    Inability: Not on file  . Transportation needs    Medical: Not on file    Non-medical: Not on file  Tobacco Use  . Smoking status: Former Smoker    Packs/day: 1.00    Years: 10.00    Pack years: 10.00    Quit date: 02/14/1974    Years since quitting: 44.8  . Smokeless tobacco: Never Used  Substance and Sexual Activity  . Alcohol use: No  . Drug use: No  . Sexual activity: Not on file  Lifestyle  . Physical activity    Days per week: Not on file    Minutes per session: Not on file  . Stress: Not on file  Relationships  . Social Herbalist on phone: Not on file    Gets together: Not on file    Attends religious service: Not on file    Active member of club or organization: Not on file    Attends meetings of clubs or organizations: Not on file    Relationship status: Not on file  Other Topics Concern  . Not on file  Social History Narrative   Lives alone in a one story home.  Has one child and one grandchild.  Retired Careers adviser.  Education: college.     Family History  Problem Relation Age of Onset  . Lung cancer Father   .  Arthritis Other        Parents  . Asthma Other        parent, other relative  . Breast cancer Other        other relative  . Hypertension Other        parent, other relative  . Heart disease Other        parent, other relative  . Heart disease Mother   . Asthma Mother   . Breast cancer Maternal Aunt 68  . Parkinson's disease Maternal Grandmother   . Rheumatic fever Maternal Grandfather     Review of Systems  Constitutional: Negative for fever.  Respiratory: Negative for cough, shortness of breath and wheezing.   Cardiovascular: Positive for leg swelling (swelling top of foot - left only). Negative for chest pain and palpitations.  Gastrointestinal: Positive for constipation and nausea. Negative for abdominal pain.       Stomach does not feel right  Genitourinary: Positive for vaginal discharge.  Musculoskeletal: Positive for arthralgias, joint swelling and neck pain.  Neurological: Positive for dizziness. Negative for headaches.       Objective:   Vitals:   12/19/18 0959  BP: (!) 164/86  Pulse: 69  Resp: 16  Temp: 98.3 F (36.8 C)  SpO2: 98%   BP Readings from Last 3 Encounters:  12/19/18 (!) 164/86  10/23/18 (!) 174/92  09/26/18 134/84   Wt Readings from Last 3 Encounters:  12/19/18 124 lb 12.8 oz (56.6 kg)  10/23/18 126 lb 12.8 oz (57.5 kg)  09/26/18 124 lb (56.2 kg)   Body mass index is 23.58 kg/m.   Physical Exam    Constitutional: Appears well-developed and well-nourished. No distress.  HENT:  Head: Normocephalic and atraumatic.  Neck: Neck supple. No tracheal deviation present. No thyromegaly present.  No cervical lymphadenopathy Cardiovascular: Normal rate, regular rhythm and normal heart sounds.   No murmur heard. No carotid bruit .  No edema Pulmonary/Chest: Effort normal and breath sounds  normal. No respiratory distress. No has no wheezes. No rales.  Msk: joint swelling hands with overlying erythema Skin: Skin is warm and dry. Not diaphoretic.   Erythema right hand and over some joints-?  Related to his topical steroid Psychiatric: Normal mood and affect. Behavior is normal.      Assessment & Plan:    See Problem List for Assessment and Plan of chronic medical problems.

## 2018-12-18 NOTE — Patient Instructions (Addendum)
  Tests ordered today. Your results will be released to [place] (or called to you) after review.  If any changes need to be made, you will be notified at that same time.    Medications reviewed and updated.  Changes include :   Stop hydralazine.  Start benicar 20 mg daily, bystolic 2.5 mg daily and prednisone 5 mg daily.  Your prescription(s) have been submitted to your pharmacy. Please take as directed and contact our office if you believe you are having problem(s) with the medication(s).  A referral was ordered for rheumatology

## 2018-12-19 ENCOUNTER — Other Ambulatory Visit: Payer: Self-pay

## 2018-12-19 ENCOUNTER — Ambulatory Visit (INDEPENDENT_AMBULATORY_CARE_PROVIDER_SITE_OTHER): Payer: Medicare Other | Admitting: Internal Medicine

## 2018-12-19 ENCOUNTER — Other Ambulatory Visit (INDEPENDENT_AMBULATORY_CARE_PROVIDER_SITE_OTHER): Payer: Medicare Other

## 2018-12-19 ENCOUNTER — Encounter: Payer: Self-pay | Admitting: Internal Medicine

## 2018-12-19 VITALS — BP 164/86 | HR 69 | Temp 98.3°F | Resp 16 | Ht 61.0 in | Wt 124.8 lb

## 2018-12-19 DIAGNOSIS — I10 Essential (primary) hypertension: Secondary | ICD-10-CM | POA: Diagnosis not present

## 2018-12-19 DIAGNOSIS — M2550 Pain in unspecified joint: Secondary | ICD-10-CM | POA: Diagnosis not present

## 2018-12-19 DIAGNOSIS — E119 Type 2 diabetes mellitus without complications: Secondary | ICD-10-CM | POA: Diagnosis not present

## 2018-12-19 DIAGNOSIS — L409 Psoriasis, unspecified: Secondary | ICD-10-CM

## 2018-12-19 DIAGNOSIS — R197 Diarrhea, unspecified: Secondary | ICD-10-CM

## 2018-12-19 DIAGNOSIS — K8689 Other specified diseases of pancreas: Secondary | ICD-10-CM | POA: Diagnosis not present

## 2018-12-19 DIAGNOSIS — R944 Abnormal results of kidney function studies: Secondary | ICD-10-CM | POA: Diagnosis not present

## 2018-12-19 LAB — CBC WITH DIFFERENTIAL/PLATELET
Basophils Absolute: 0.1 10*3/uL (ref 0.0–0.1)
Basophils Relative: 1.1 % (ref 0.0–3.0)
Eosinophils Absolute: 0.1 10*3/uL (ref 0.0–0.7)
Eosinophils Relative: 1.2 % (ref 0.0–5.0)
HCT: 36.4 % (ref 36.0–46.0)
Hemoglobin: 12 g/dL (ref 12.0–15.0)
Lymphocytes Relative: 18.3 % (ref 12.0–46.0)
Lymphs Abs: 1.6 10*3/uL (ref 0.7–4.0)
MCHC: 33 g/dL (ref 30.0–36.0)
MCV: 94.3 fl (ref 78.0–100.0)
Monocytes Absolute: 0.6 10*3/uL (ref 0.1–1.0)
Monocytes Relative: 6.9 % (ref 3.0–12.0)
Neutro Abs: 6.2 10*3/uL (ref 1.4–7.7)
Neutrophils Relative %: 72.5 % (ref 43.0–77.0)
Platelets: 361 10*3/uL (ref 150.0–400.0)
RBC: 3.86 Mil/uL — ABNORMAL LOW (ref 3.87–5.11)
RDW: 13.8 % (ref 11.5–15.5)
WBC: 8.5 10*3/uL (ref 4.0–10.5)

## 2018-12-19 LAB — HEMOGLOBIN A1C: Hgb A1c MFr Bld: 5.8 % (ref 4.6–6.5)

## 2018-12-19 LAB — COMPREHENSIVE METABOLIC PANEL
ALT: 26 U/L (ref 0–35)
AST: 26 U/L (ref 0–37)
Albumin: 4.3 g/dL (ref 3.5–5.2)
Alkaline Phosphatase: 115 U/L (ref 39–117)
BUN: 17 mg/dL (ref 6–23)
CO2: 28 mEq/L (ref 19–32)
Calcium: 9.6 mg/dL (ref 8.4–10.5)
Chloride: 101 mEq/L (ref 96–112)
Creatinine, Ser: 1.16 mg/dL (ref 0.40–1.20)
GFR: 45.52 mL/min — ABNORMAL LOW (ref >60.00)
Glucose, Bld: 105 mg/dL — ABNORMAL HIGH (ref 70–99)
Potassium: 4.5 mEq/L (ref 3.5–5.1)
Sodium: 137 mEq/L (ref 135–145)
Total Bilirubin: 0.4 mg/dL (ref 0.2–1.2)
Total Protein: 7.3 g/dL (ref 6.0–8.3)

## 2018-12-19 LAB — SEDIMENTATION RATE: Sed Rate: 42 mm/hr — ABNORMAL HIGH (ref 0–30)

## 2018-12-19 LAB — C-REACTIVE PROTEIN: CRP: 1.2 mg/dL (ref 0.5–20.0)

## 2018-12-19 MED ORDER — BENICAR 20 MG PO TABS: ORAL_TABLET | ORAL | 5 refills | Status: DC

## 2018-12-19 MED ORDER — NEBIVOLOL HCL 2.5 MG PO TABS: ORAL_TABLET | ORAL | 5 refills | Status: DC

## 2018-12-19 MED ORDER — PREDNISONE 5 MG PO TABS: 5.0000 mg | ORAL_TABLET | Freq: Every day | ORAL | 1 refills | Status: DC

## 2018-12-19 NOTE — Assessment & Plan Note (Addendum)
Joint pain is severe She does have some joint swelling Concern for possible psoriatic arthritis versus lupus History of positive ANA We will check autoimmune blood work Refer to rheumatology-her pain is severe and we are very limited with what we can treat her with-if if this is autoimmune she would benefit from going back on a biologic She does not want to take any narcotics-she does not do well with them We will try very low-dose of prednisone-5 mg only-she agrees to this.  We both agree that should only be short-term

## 2018-12-19 NOTE — Assessment & Plan Note (Signed)
She has been taking Advil because her joint pain is severe She also takes Tylenol, but that does not touch it We will start a very low dose of prednisone-encouraged her to try to decrease Advil use CMP

## 2018-12-19 NOTE — Assessment & Plan Note (Signed)
Diet controlled We will be starting very low-dose of prednisone to see if that helps and will need to monitor sugars, but there are little options as far as her joint pain

## 2018-12-19 NOTE — Assessment & Plan Note (Signed)
Following with Dr. Collene Mares Taking pancreatic enzymes

## 2018-12-19 NOTE — Assessment & Plan Note (Signed)
Elevated here-likely higher due to severe joint pain Currently taking hydralazine-we will discontinue because of the slight risk of causing lupus and that was a concern for her in the past Restart Benicar 20 mg daily-was on 40 mg previously.  This was discontinued secondary to decreased GFR while in the hospital-we will need to monitor closely Restart Bystolic 2.5 mg daily She will monitor BP and call with an update later this week CMP

## 2018-12-19 NOTE — Assessment & Plan Note (Signed)
Follows with dermatology-no longer on a biologic

## 2018-12-19 NOTE — Progress Notes (Signed)
Office Visit Note  Patient: [name]             Date of Birth: [birth year]           MRN: [id number]             PCP: [name], MD Referring: [name], MD Visit Date: 12/24/2018 Occupation: _0 @ ############################################################# Subjective:  New Patient (Initial Visit) (Bil pain and swelling in hands, wrists, arms and shoulders, was on Enbrel until hospitalization in June, 2020)   History of Present Illness: [name] is a 73 y.o. female seen in consultation per request of Dr. Quay Burow.  According to patient she was diagnosed with eczema in her childhood and was treated with topical agents.  She states the rash eventually resolved when she was 11.  In her late 82s she started having rash on her scalp on her palmar and plantar region.  She was under care of Dr. Radford Pax who treated with the topical steroids.  As her ANA was positive he was also suspicious of possible lupus.  She was given Plaquenil for some time and that it was discontinued as the diagnosis of lupus was ruled out.  She states about 10 years ago after Dr. Radford Pax retired Dr. Ubaldo Glassing took over her care.  She diagnosed her with psoriasis and prescribed Psoriatain but she had an allergic reaction to the medication.  She was referred to Dr. Lyman Speller, dermatologist at [facility].  He also confirmed the diagnosis of psoriasis by doing a biopsy.  He prescribed AndroGel about 4 years ago.  She has been on Enbrel since then.  She states she had improvement in her rash.  She was also using some topical steroids and light therapy.  The last dose of her Enbrel was on July 23, 2018.  She states last year she started experiencing vertigo and was found to have some pulmonary nodules.  For which she was seen by Dr. Earlie Server who ruled out cancer.  She was referred to Dr. Lake Bells who found some atypical cells.  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  And he did not  recommend any treatment.  Patient states in June she was hospitalized with nausea vomiting and diarrhea.  She had second hospitalization with renal failure and she has persistently low GFR now.  She states she was seen by Dr. 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  She states her skin has been not as bad recently.  On November 30, 2018 she started having pain and discomfort in her right thumb which gradually moved to her bilateral hands.  She states she has pain and swelling in her bilateral hands.  She also has discomfort in her elbows, shoulders and her C-spine.  She has no discomfort in her lower extremities.  She states the pain has been much more severe in the last 2 weeks.  Dr. Quay Burow prescribed prednisone 5 mg p.o. daily which has not been helping her.  She states she has been taking anti-inflammatories and has reduced the dose of her blood thinner.  She has chronic discomfort in her back from a scoliosis and some hip joint discomfort.  Activities of Daily Living:  Patient reports morning stiffness for 24 hours.   Patient Reports nocturnal pain.  Difficulty dressing/grooming: Reports Difficulty climbing stairs: Denies Difficulty getting out of chair: Reports Difficulty using hands for taps, buttons, cutlery, and/or writing: Reports  Review  of Systems  Constitutional: Positive for fatigue. Negative for night sweats, weight gain and weight loss.  HENT: Negative for mouth sores, trouble swallowing, trouble swallowing, mouth dryness and nose dryness.   Eyes: Positive for dryness. Negative for pain, redness and visual disturbance.  Respiratory: Negative for cough, shortness of breath and difficulty breathing.   Cardiovascular: Negative for chest pain, palpitations, hypertension, irregular heartbeat and swelling in legs/feet.  Gastrointestinal: Negative for blood in stool, constipation and diarrhea.  Endocrine: Negative for excessive thirst and increased  urination.  Genitourinary: Negative for difficulty urinating and vaginal dryness.  Musculoskeletal: Positive for arthralgias, gait problem, joint pain, joint swelling and morning stiffness. Negative for myalgias, muscle weakness, muscle tenderness and myalgias.  Skin: Positive for rash. Negative for color change, hair loss, skin tightness, ulcers and sensitivity to sunlight.  Allergic/Immunologic: Negative for susceptible to infections.  Neurological: Positive for dizziness, light-headedness and weakness. Negative for memory loss and night sweats.  Hematological: Positive for bruising/bleeding tendency. Negative for swollen glands.  Psychiatric/Behavioral: Positive for sleep disturbance. Negative for depressed mood. The patient is not nervous/anxious.     PMFS History:  Patient Active Problem List   Diagnosis Date Noted   Pancreatic insufficiency 10/23/2018   Multiple pulmonary nodules determined by computed tomography of lung 09/18/2018   Decreased GFR 08/21/2018   Nausea vomiting and diarrhea 08/20/2018   Syncope 08/01/2018   Hypokalemia 08/01/2018   Abnormal CT scan, pelvis 07/31/2018   Pneumothorax 03/14/2018   S/P bronchoscopy with biopsy    Lung abnormality 01/11/2018   Pulmonary embolism ([place]) 12/24/2017   Lung mass 12/24/2017   Small vessel disease, cerebrovascular 07/31/2017   Atypical chest pain 06/14/2017   Dizziness 06/14/2017   Hyperuricemia 05/03/2017   Arthralgia 05/01/2017   Hair loss 05/01/2017   Vitamin D deficiency 10/31/2016   Redness of skin, feet 10/31/2016   Diarrhea 76/73/4193   Diastolic dysfunction 79/03/4095   Vertigo 12/24/2015   Nonspecific abnormal electrocardiogram (ECG) (EKG) 12/24/2015   Bilateral carotid artery disease, Mild 05/31/2015   Thyroid nodule 05/31/2015   Psoriasis    Scoliosis    Diabetes type 2, controlled ([place]) 06/03/2008   Dyslipidemia 06/03/2008   CARPAL TUNNEL SYNDROME 06/03/2008   HTN  (hypertension) 06/03/2008   ALLERGIC RHINITIS 06/03/2008   Asthma 06/03/2008   GERD (gastroesophageal reflux disease) 06/03/2008   Osteopenia 06/03/2008   COLONIC POLYPS, HX OF 06/03/2008    Past Medical History:  Diagnosis Date   ALLERGIC RHINITIS    ANEMIA-NOS    Arthritis    ASTHMA    Carpal tunnel syndrome    COLONIC POLYPS, HX OF    Coronary artery disease    "mild CAD" noted on 12/05/17 in coronary CT scan   DIABETES MELLITUS, TYPE II    diet controlled   GERD    HYPERLIPIDEMIA    HYPERTENSION    OSTEOPENIA    PONV (postoperative nausea and vomiting)    Psoriasis    severe, began soriatane 01/2012   Rectal fissure    Scoliosis     Family History  Problem Relation Age of Onset   Lung cancer Father    Arthritis Other        Parents   Asthma Other        parent, other relative   Breast cancer Other        other relative   Hypertension Other        parent, other relative   Heart disease Other  parent, other relative   Heart disease Mother    Asthma Mother    Breast cancer Maternal Aunt 38   Parkinson's disease Maternal Grandmother    Rheumatic fever Maternal Grandfather    Past Surgical History:  Procedure Laterality Date   benign rectal growth  2004   removed by Dr. Zella Richer   BIOPSY  08/09/2018   Procedure: BIOPSY;  Surgeon: Carol Ada, MD;  Location: [place];  Service: Endoscopy;;   BIOPSY  08/22/2018   Procedure: BIOPSY;  Surgeon: Juanita Craver, MD;  Location: [facility] ENDOSCOPY;  Service: Endoscopy;;   BREAST BIOPSY Right    BREAST EXCISIONAL BIOPSY Left    BREAST SURGERY  1988   biopsy   CESAREAN SECTION     COLONOSCOPY N/A 08/22/2018   Procedure: COLONOSCOPY;  Surgeon: Juanita Craver, MD;  Location: [facility] ENDOSCOPY;  Service: Endoscopy;  Laterality: N/A;   ESOPHAGOGASTRODUODENOSCOPY (EGD) WITH PROPOFOL N/A 08/04/2018   Procedure: ESOPHAGOGASTRODUODENOSCOPY (EGD) WITH PROPOFOL;  Surgeon: Ladene Artist,  MD;  Location: [place];  Service: Gastroenterology;  Laterality: N/A;   FLEXIBLE SIGMOIDOSCOPY N/A 08/04/2018   Procedure: FLEXIBLE SIGMOIDOSCOPY;  Surgeon: Ladene Artist, MD;  Location: [place];  Service: Gastroenterology;  Laterality: N/A;   FLEXIBLE SIGMOIDOSCOPY N/A 08/09/2018   Procedure: FLEXIBLE SIGMOIDOSCOPY;  Surgeon: Carol Ada, MD;  Location: [place];  Service: Endoscopy;  Laterality: N/A;   VIDEO BRONCHOSCOPY WITH ENDOBRONCHIAL NAVIGATION N/A 03/14/2018   Procedure: VIDEO BRONCHOSCOPY WITH ENDOBRONCHIAL NAVIGATION;  Surgeon: Collene Gobble, MD;  Location: [facility] OR;  Service: Thoracic;  Laterality: N/A;   Social History   Social History Narrative   Lives alone in a one story home.  Has one child and one grandchild.  Retired Careers adviser.  Education: college.    Immunization History  Administered Date(s) Administered   Fluad Quad(high Dose 65+) 10/23/2018   Influenza Split 11/29/2010, 11/15/2011   Influenza, High Dose Seasonal PF 12/28/2012, 10/28/2015, 10/31/2016, 10/30/2017   Influenza,inj,Quad PF,6+ Mos 10/22/2013, 11/07/2014   PPD Test 08/27/2012, 11/06/2014, 01/18/2016   Pneumococcal Conjugate-13 10/22/2013   Pneumococcal Polysaccharide-23 02/14/2002, 12/23/2014   Td 02/15/2004, 06/03/2014     Objective: Vital Signs: BP (!) 187/84 (BP Location: Right Arm, Patient Position: Sitting, Cuff Size: Normal)    Pulse 60    Resp 16    Ht 5' 0.5" (1.537 m)    Wt 127 lb (57.6 kg)    BMI 24.39 kg/m    Physical Exam Vitals signs and nursing note reviewed.  Constitutional:      Appearance: She is well-developed.  HENT:     Head: Normocephalic and atraumatic.  Eyes:     Conjunctiva/sclera: Conjunctivae normal.  Neck:     Musculoskeletal: Normal range of motion.  Cardiovascular:     Rate and Rhythm: Normal rate and regular rhythm.     Heart sounds: Normal heart sounds.  Pulmonary:     Effort: Pulmonary effort is  normal.     Breath sounds: Normal breath sounds.  Abdominal:     General: Bowel sounds are normal.     Palpations: Abdomen is soft.  Lymphadenopathy:     Cervical: No cervical adenopathy.  Skin:    General: Skin is warm and dry.     Capillary Refill: Capillary refill takes less than 2 seconds.  Neurological:     Mental Status: She is alert and oriented to person, place, and time.  Psychiatric:        Behavior: Behavior normal.  Musculoskeletal Exam: C-spine was in good range of motion.  She has thoracic kyphosis.  Shoulder joints and elbow joints were in good range of motion without any discomfort.  She had no synovitis of her wrist joints.  She has some synovitis of her left second MCP joint.  She has some tenderness over PIPs as described below.  She has good range of motion of her hip joints knee joints ankles and MTP joints.  CDAI Exam: CDAI Score: 5.3  Patient Global: 8 mm; Provider Global: 5 mm Swollen: 1 ; Tender: 3  Joint Exam      Right  Left  MCP 1   Tender     MCP 2     Swollen Tender  PIP 2      Tender     Investigation: No additional findings.  Imaging: Xr Hand 2 View Left  Result Date: 12/24/2018 No MCP PIP or DIP narrowing was noted.  No intercarpal radiocarpal joint space narrowing was noted.  No erosive changes were noted.  Generalized osteopenia was noted. Impression: Unremarkable x-ray of the hand.  Xr Hand 2 View Right  Result Date: 12/24/2018 No MCP PIP or DIP narrowing was noted.  No intercarpal radiocarpal joint space narrowing was noted.  No erosive changes were noted.  Generalized osteopenia was noted. Impression: Unremarkable x-ray of the hand.  Xr Pelvis 1-2 Views  Result Date: 12/24/2018 No SI joint narrowing or sclerosis was noted.  Severe lumbar spine scoliosis was noted. Impression: Unremarkable x-ray of the SI joints.   Recent Labs: Lab Results  Component Value Date   WBC 8.5 12/19/2018   HGB 12.0 12/19/2018   PLT 361.0  12/19/2018   NA 137 12/19/2018   K 4.5 12/19/2018   CL 101 12/19/2018   CO2 28 12/19/2018   GLUCOSE 105 (H) 12/19/2018   BUN 17 12/19/2018   CREATININE 1.16 12/19/2018   BILITOT 0.4 12/19/2018   ALKPHOS 115 12/19/2018   AST 26 12/19/2018   ALT 26 12/19/2018   PROT 7.3 12/19/2018   ALBUMIN 4.3 12/19/2018   CALCIUM 9.6 12/19/2018   GFRAA 46 (L) 09/03/2018  December 19, 2018 ANA 1: 1280 homogeneous, ESR 42, CRP 1.2, RF negative, anti-CCP negative  Speciality Comments: No specialty comments available.  Procedures:  No procedures performed Allergies: Phenergan [promethazine hcl], Amlodipine, Clarithromycin, Codeine, Erythromycin, Statins, and Tetracycline   Assessment / Plan:     Visit Diagnoses: Polyarthralgia -patient complains of increased arthralgias recently.  She states she is in extreme pain.  She describes pain and discomfort in her shoulders, elbows and her hands.  She had only swelling in her left second MCP joint on examination today.  She has inflammatory arthritis in the differential will be psoriatic arthritis, autoimmune disease as she has positive ANA.  She is currently on prednisone 5 mg p.o. daily by Dr. Quay Burow.  She states is not controlling her symptoms.  I have advised her to increase prednisone to 10 mg p.o. daily in the morning to see if it helps.  She has been taking NSAIDs.  I have advised her not to take NSAIDs because of being on anticoagulant therapy and also low GFR.  Pain in both hands -she complains of pain and swelling in her bilateral hands.  Swelling was noted in the left second MCP joint and some tenderness over PIPs as described above.  Plan: XR Hand 2 View Right, XR Hand 2 View Left  Chronic midline low back pain without sciatica -patient complains of  chronic lower back pain.  Plan: XR Pelvis 1-2 Views  Psoriasis-patient gives history of psoriasis in her scalp, palmar and plantar region.  She has been under care of Dr. Lyman Speller.  She has done really well on  Enbrel in the last 4 years.  She came off Enbrel in June when she was sick.  She plans on resuming Enbrel after talking to Dr. Lyman Speller.  High risk medication-patient had been on Enbrel for the last 4 years by Dr. Lyman Speller.  She states her psoriasis was very well controlled on.  She wants to go back on Enbrel.  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  I would refer her to infectious disease to see if that needs to be treated or she can continue Enbrel.  Positive ANA (antinuclear antibody)-patient states that her ANA titer has been positive for more than 40 years.  She was even tried on Plaquenil at one time.  Lupus was ruled out in the past.  We will obtain AVISE labs today.  Osteopenia of forearm, unspecified laterality-patient states that she has had bone density in the past and she does not want to take any medications for osteopenia and this the reason she decided not to have any future bone densities.  Vitamin D deficiency-patient states she takes vitamin D.  Other secondary scoliosis, unspecified spinal region-she complains of chronic back pain related to scoliosis.  Controlled type 2 diabetes mellitus without complication, unspecified whether long term insulin use (HCC)-patient states she does not require any medication.  Although she is on low-dose prednisone currently.  Gastroesophageal reflux disease, unspecified whether esophagitis present-patient has been followed by Dr. Collene Mares and had been diagnosed with pancreatic atrophy per patient.  She is on Creon.  Essential hypertension-her blood pressures are still quite elevated.  Bilateral carotid artery disease, unspecified type ([place])  Small vessel disease, cerebrovascular  Other chronic pulmonary embolism without acute cor pulmonale (HCC)-she is on chronic anticoagulant therapy.  Multiple pulmonary nodules determined by computed tomography of lung-patient has been diagnosed with MAC by Dr. Melvyn Novas recently.  Thyroid  nodule  Orders: Orders Placed This Encounter  Procedures   XR Hand 2 View Right   XR Hand 2 View Left   XR Pelvis 1-2 Views   Ambulatory referral to Infectious Disease   No orders of the defined types were placed in this encounter.   Face-to-face time spent with patient was 90 minutes. Greater than 50% of time was spent in counseling and coordination of care.  Follow-Up Instructions: Return for Psoriasis, inflammatory arthritis.   Bo Merino, MD  Note - This record has been created using Editor, commissioning.  Chart creation errors have been sought, but may not always  have been located. Such creation errors do not reflect on  the standard of medical care.

## 2018-12-21 LAB — RHEUMATOID FACTOR: Rheumatoid fact SerPl-aCnc: <14 IU/mL (ref <14)

## 2018-12-21 LAB — CYCLIC CITRUL PEPTIDE ANTIBODY, IGG: Cyclic Citrullin Peptide Ab: <16 UNITS

## 2018-12-21 LAB — HLA-B27 ANTIGEN: HLA-B27 Antigen: NEGATIVE

## 2018-12-21 LAB — ANTI-NUCLEAR AB-TITER (ANA TITER): ANA Titer 1: 1:1280 {titer} — ABNORMAL HIGH

## 2018-12-21 LAB — ANA: Anti Nuclear Antibody (ANA): POSITIVE — AB

## 2018-12-24 ENCOUNTER — Ambulatory Visit (INDEPENDENT_AMBULATORY_CARE_PROVIDER_SITE_OTHER): Payer: Medicare Other | Admitting: Rheumatology

## 2018-12-24 ENCOUNTER — Encounter: Payer: Self-pay | Admitting: Rheumatology

## 2018-12-24 ENCOUNTER — Other Ambulatory Visit: Payer: Self-pay

## 2018-12-24 ENCOUNTER — Ambulatory Visit: Payer: Self-pay

## 2018-12-24 ENCOUNTER — Other Ambulatory Visit: Payer: Self-pay | Admitting: Internal Medicine

## 2018-12-24 VITALS — BP 187/84 | HR 60 | Resp 16 | Ht 60.5 in | Wt 127.0 lb

## 2018-12-24 DIAGNOSIS — M79641 Pain in right hand: Secondary | ICD-10-CM | POA: Diagnosis not present

## 2018-12-24 DIAGNOSIS — L409 Psoriasis, unspecified: Secondary | ICD-10-CM

## 2018-12-24 DIAGNOSIS — K8689 Other specified diseases of pancreas: Secondary | ICD-10-CM

## 2018-12-24 DIAGNOSIS — M79642 Pain in left hand: Secondary | ICD-10-CM

## 2018-12-24 DIAGNOSIS — M2550 Pain in unspecified joint: Secondary | ICD-10-CM

## 2018-12-24 DIAGNOSIS — M545 Low back pain: Secondary | ICD-10-CM | POA: Diagnosis not present

## 2018-12-24 DIAGNOSIS — E041 Nontoxic single thyroid nodule: Secondary | ICD-10-CM

## 2018-12-24 DIAGNOSIS — M85839 Other specified disorders of bone density and structure, unspecified forearm: Secondary | ICD-10-CM

## 2018-12-24 DIAGNOSIS — I779 Disorder of arteries and arterioles, unspecified: Secondary | ICD-10-CM

## 2018-12-24 DIAGNOSIS — E119 Type 2 diabetes mellitus without complications: Secondary | ICD-10-CM

## 2018-12-24 DIAGNOSIS — Z79899 Other long term (current) drug therapy: Secondary | ICD-10-CM

## 2018-12-24 DIAGNOSIS — I2782 Chronic pulmonary embolism: Secondary | ICD-10-CM

## 2018-12-24 DIAGNOSIS — G8929 Other chronic pain: Secondary | ICD-10-CM

## 2018-12-24 DIAGNOSIS — I10 Essential (primary) hypertension: Secondary | ICD-10-CM

## 2018-12-24 DIAGNOSIS — R918 Other nonspecific abnormal finding of lung field: Secondary | ICD-10-CM

## 2018-12-24 DIAGNOSIS — E559 Vitamin D deficiency, unspecified: Secondary | ICD-10-CM

## 2018-12-24 DIAGNOSIS — I679 Cerebrovascular disease, unspecified: Secondary | ICD-10-CM

## 2018-12-24 DIAGNOSIS — K219 Gastro-esophageal reflux disease without esophagitis: Secondary | ICD-10-CM

## 2018-12-24 DIAGNOSIS — R768 Other specified abnormal immunological findings in serum: Secondary | ICD-10-CM

## 2018-12-24 DIAGNOSIS — M4150 Other secondary scoliosis, site unspecified: Secondary | ICD-10-CM

## 2018-12-24 MED ORDER — PREDNISONE 10 MG PO TABS: 10.0000 mg | ORAL_TABLET | Freq: Every day | ORAL | 1 refills | Status: DC

## 2018-12-25 ENCOUNTER — Encounter: Payer: Self-pay | Admitting: Rheumatology

## 2018-12-25 ENCOUNTER — Ambulatory Visit: Payer: Medicare Other | Admitting: Internal Medicine

## 2018-12-31 NOTE — Progress Notes (Deleted)
Office Visit Note  Patient: [name]             Date of Birth: 10/24/1945           MRN: [id number]             PCP: [name], MD Referring: [name], MD Visit Date: 01/14/2019 Occupation: @GUAROCC @  Subjective:  No chief complaint on file.   History of Present Illness: [name] is a 73 y.o. female ***   Activities of Daily Living:  Patient reports morning stiffness for *** {minute/hour:19697}.   Patient {ACTIONS;DENIES/REPORTS:21021675::"Denies"} nocturnal pain.  Difficulty dressing/grooming: {ACTIONS;DENIES/REPORTS:21021675::"Denies"} Difficulty climbing stairs: {ACTIONS;DENIES/REPORTS:21021675::"Denies"} Difficulty getting out of chair: {ACTIONS;DENIES/REPORTS:21021675::"Denies"} Difficulty using hands for taps, buttons, cutlery, and/or writing: {ACTIONS;DENIES/REPORTS:21021675::"Denies"}  No Rheumatology ROS completed.   PMFS History:  Patient Active Problem List   Diagnosis Date Noted  . Pancreatic insufficiency 10/23/2018  . Multiple pulmonary nodules determined by computed tomography of lung 09/18/2018  . Decreased GFR 08/21/2018  . Nausea vomiting and diarrhea 08/20/2018  . Syncope 08/01/2018  . Hypokalemia 08/01/2018  . Abnormal CT scan, pelvis 07/31/2018  . Pneumothorax 03/14/2018  . S/P bronchoscopy with biopsy   . Lung abnormality 01/11/2018  . Pulmonary embolism ([place]) 12/24/2017  . Lung mass 12/24/2017  . Small vessel disease, cerebrovascular 07/31/2017  . Atypical chest pain 06/14/2017  . Dizziness 06/14/2017  . Hyperuricemia 05/03/2017  . Arthralgia 05/01/2017  . Hair loss 05/01/2017  . Vitamin D deficiency 10/31/2016  . Redness of skin, feet 10/31/2016  . Diarrhea 06/06/2016  . Diastolic dysfunction 24/10/7351  . Vertigo 12/24/2015  . Nonspecific abnormal electrocardiogram (ECG) (EKG) 12/24/2015  . Bilateral carotid artery disease, Mild 05/31/2015  . Thyroid nodule 05/31/2015  . Psoriasis   . Scoliosis   .  Diabetes type 2, controlled ([place]) 06/03/2008  . Dyslipidemia 06/03/2008  . CARPAL TUNNEL SYNDROME 06/03/2008  . HTN (hypertension) 06/03/2008  . ALLERGIC RHINITIS 06/03/2008  . Asthma 06/03/2008  . GERD (gastroesophageal reflux disease) 06/03/2008  . Osteopenia 06/03/2008  . COLONIC POLYPS, HX OF 06/03/2008    Past Medical History:  Diagnosis Date  . ALLERGIC RHINITIS   . ANEMIA-NOS   . Arthritis   . ASTHMA   . Carpal tunnel syndrome   . COLONIC POLYPS, HX OF   . Coronary artery disease    "mild CAD" noted on 12/05/17 in coronary CT scan  . DIABETES MELLITUS, TYPE II    diet controlled  . GERD   . HYPERLIPIDEMIA   . HYPERTENSION   . OSTEOPENIA   . PONV (postoperative nausea and vomiting)   . Psoriasis    severe, began soriatane 01/2012  . Rectal fissure   . Scoliosis     Family History  Problem Relation Age of Onset  . Lung cancer Father   . Arthritis Other        Parents  . Asthma Other        parent, other relative  . Breast cancer Other        other relative  . Hypertension Other        parent, other relative  . Heart disease Other        parent, other relative  . Heart disease Mother   . Asthma Mother   . Breast cancer Maternal Aunt 68  . Parkinson's disease Maternal Grandmother   . Rheumatic fever Maternal Grandfather    Past Surgical History:  Procedure Laterality Date  . benign rectal growth  2004   removed by Dr. Zella Richer  . BIOPSY  08/09/2018   Procedure: BIOPSY;  Surgeon: Carol Ada, MD;  Location[facility] ENDOSCOPY;  Service: Endoscopy;;  . BIOPSY  08/22/2018   Procedure: BIOPSY;  Surgeon: Juanita Craver, MD;  Location: [facility] ENDOSCOPY;  Service: Endoscopy;;  . BREAST BIOPSY Right   . BREAST EXCISIONAL BIOPSY Left   . BREAST SURGERY  1988   biopsy  . CESAREAN SECTION    . COLONOSCOPY N/A 08/22/2018   Procedure: COLONOSCOPY;  Surgeon: Juanita Craver, MD;  Location: [facility] ENDOSCOPY;  Service: Endoscopy;  Laterality: N/A;  . ESOPHAGOGASTRODUODENOSCOPY (EGD)  WITH PROPOFOL N/A 08/04/2018   Procedure: ESOPHAGOGASTRODUODENOSCOPY (EGD) WITH PROPOFOL;  Surgeon: Ladene Artist, MD;  Location: [place];  Service: Gastroenterology;  Laterality: N/A;  . FLEXIBLE SIGMOIDOSCOPY N/A 08/04/2018   Procedure: FLEXIBLE SIGMOIDOSCOPY;  Surgeon: Ladene Artist, MD;  Location: [place];  Service: Gastroenterology;  Laterality: N/A;  . FLEXIBLE SIGMOIDOSCOPY N/A 08/09/2018   Procedure: FLEXIBLE SIGMOIDOSCOPY;  Surgeon: Carol Ada, MD;  Location: [place];  Service: Endoscopy;  Laterality: N/A;  . VIDEO BRONCHOSCOPY WITH ENDOBRONCHIAL NAVIGATION N/A 03/14/2018   Procedure: VIDEO BRONCHOSCOPY WITH ENDOBRONCHIAL NAVIGATION;  Surgeon: Collene Gobble, MD;  Location: [facility];  Service: Thoracic;  Laterality: N/A;   Social History   Social History Narrative   Lives alone in a one story home.  Has one child and one grandchild.  Retired Careers adviser.  Education: college.    Immunization History  Administered Date(s) Administered  . Fluad Quad(high Dose 65+) 10/23/2018  . Influenza Split 11/29/2010, 11/15/2011  . Influenza, High Dose Seasonal PF 12/28/2012, 10/28/2015, 10/31/2016, 10/30/2017  . Influenza,inj,Quad PF,6+ Mos 10/22/2013, 11/07/2014  . PPD Test 08/27/2012, 11/06/2014, 01/18/2016  . Pneumococcal Conjugate-13 10/22/2013  . Pneumococcal Polysaccharide-23 02/14/2002, 12/23/2014  . Td 02/15/2004, 06/03/2014     Objective: Vital Signs: There were no vitals taken for this visit.   Physical Exam   Musculoskeletal Exam: ***  CDAI Exam: CDAI Score: - Patient Global: -; Provider Global: - Swollen: -; Tender: - Joint Exam   No joint exam has been documented for this visit   There is currently no information documented on the homunculus. Go to the Rheumatology activity and complete the homunculus joint exam.  Investigation: No additional findings.  Imaging: Xr Hand 2 View Left  Result Date: 12/24/2018 No  MCP PIP or DIP narrowing was noted.  No intercarpal radiocarpal joint space narrowing was noted.  No erosive changes were noted.  Generalized osteopenia was noted. Impression: Unremarkable x-ray of the hand.  Xr Hand 2 View Right  Result Date: 12/24/2018 No MCP PIP or DIP narrowing was noted.  No intercarpal radiocarpal joint space narrowing was noted.  No erosive changes were noted.  Generalized osteopenia was noted. Impression: Unremarkable x-ray of the hand.  Xr Pelvis 1-2 Views  Result Date: 12/24/2018 No SI joint narrowing or sclerosis was noted.  Severe lumbar spine scoliosis was noted. Impression: Unremarkable x-ray of the SI joints.   Recent Labs: Lab Results  Component Value Date   WBC 8.5 12/19/2018   HGB 12.0 12/19/2018   PLT 361.0 12/19/2018   NA 137 12/19/2018   K 4.5 12/19/2018   CL 101 12/19/2018   CO2 28 12/19/2018   GLUCOSE 105 (H) 12/19/2018   BUN 17 12/19/2018   CREATININE 1.16 12/19/2018   BILITOT 0.4 12/19/2018   ALKPHOS 115 12/19/2018   AST 26 12/19/2018   ALT 26 12/19/2018  PROT 7.3 12/19/2018   ALBUMIN 4.3 12/19/2018   CALCIUM 9.6 12/19/2018   GFRAA 46 (L) 09/03/2018    Speciality Comments: No specialty comments available.  Procedures:  No procedures performed Allergies: Phenergan [promethazine hcl], Amlodipine, Clarithromycin, Codeine, Erythromycin, Statins, and Tetracycline   Assessment / Plan:     Visit Diagnoses: No diagnosis found.  Orders: No orders of the defined types were placed in this encounter.  No orders of the defined types were placed in this encounter.   Face-to-face time spent with patient was *** minutes. Greater than 50% of time was spent in counseling and coordination of care.  Follow-Up Instructions: No follow-ups on file.   Bo Merino, MD  Note - This record has been created using Editor, commissioning.  Chart creation errors have been sought, but may not always  have been located. Such creation errors do not  reflect on  the standard of medical care.

## 2018-12-31 NOTE — Progress Notes (Signed)
Office Visit Note  Patient: [name]             Date of Birth: 08-20-1945           MRN: [id number]             PCP: [name], MD Referring: [name], MD Visit Date: 01/03/2019 Occupation: @GUAROCC @  Subjective:  Pain in multiple joints and diarrhea.   History of Present Illness: [name] is a 73 y.o. female with history of psoriasis.  She recently developed inflammatory arthritis.  Patient states she has been having increased pain and inflammation in her joints.  She states her shoulder joints, wrist joints and hands have been very painful and swollen.  She took some Advil last night which has helped to some extent.  She states she was on Enbrel for the treatment of psoriasis by Dr. Lyman Speller for many years.  She came off Enbrel in June due to severe diarrhea.  She has been experiencing increased joint swelling since then.  Patient states that she tried taking some prednisone at home and had been having diarrhea now.  She states she had about 7 loose stools this morning.  She states she went to see Dr. Collene Mares last Tuesday for diarrhea.  She has not had any relief.  She is a lot of discomfort due to pain and swelling.  Activities of Daily Living:  Patient reports morning stiffness for 24 hours.   Patient Reports nocturnal pain.  Difficulty dressing/grooming: Reports Difficulty climbing stairs: Denies Difficulty getting out of chair: Denies Difficulty using hands for taps, buttons, cutlery, and/or writing: Reports  Review of Systems  Constitutional: Positive for fatigue. Negative for night sweats, weight gain and weight loss.  HENT: Negative for mouth sores, trouble swallowing, trouble swallowing, mouth dryness and nose dryness.   Eyes: Negative for pain, redness, itching, visual disturbance and dryness.  Respiratory: Negative for cough, shortness of breath and difficulty breathing.   Cardiovascular: Negative for chest pain, palpitations, hypertension,  irregular heartbeat and swelling in legs/feet.  Gastrointestinal: Positive for diarrhea. Negative for blood in stool and constipation.  Endocrine: Negative for increased urination.  Genitourinary: Negative for difficulty urinating and vaginal dryness.  Musculoskeletal: Positive for arthralgias, joint pain, joint swelling and morning stiffness. Negative for myalgias, muscle weakness, muscle tenderness and myalgias.  Skin: Positive for hair loss and redness. Negative for color change, rash, skin tightness, ulcers and sensitivity to sunlight.  Allergic/Immunologic: Negative for susceptible to infections.  Neurological: Positive for numbness. Negative for dizziness, headaches, memory loss, night sweats and weakness.  Hematological: Positive for bruising/bleeding tendency. Negative for swollen glands.  Psychiatric/Behavioral: Positive for sleep disturbance. Negative for depressed mood and confusion. The patient is not nervous/anxious.     PMFS History:  Patient Active Problem List   Diagnosis Date Noted   Inflammatory arthritis 01/02/2019   Positive ANA (antinuclear antibody) 01/02/2019   Pancreatic insufficiency 10/23/2018   Multiple pulmonary nodules determined by computed tomography of lung 09/18/2018   Decreased GFR 08/21/2018   Nausea vomiting and diarrhea 08/20/2018   Syncope 08/01/2018   Hypokalemia 08/01/2018   Abnormal CT scan, pelvis 07/31/2018   Pneumothorax 03/14/2018   Lung abnormality 01/11/2018   Pulmonary embolism ([place]) 12/24/2017   Lung mass 12/24/2017   Small vessel disease, cerebrovascular 07/31/2017   Atypical chest pain 06/14/2017   Dizziness 06/14/2017   Hyperuricemia 05/03/2017   Arthralgia 05/01/2017   Hair loss 05/01/2017   Vitamin D deficiency 10/31/2016  Diarrhea 28/36/6294   Diastolic dysfunction 76/54/6503   Vertigo 12/24/2015   Nonspecific abnormal electrocardiogram (ECG) (EKG) 12/24/2015   Bilateral carotid artery disease,  Mild 05/31/2015   Thyroid nodule 05/31/2015   Psoriasis    Scoliosis    Diabetes type 2, controlled ([place]) 06/03/2008   Dyslipidemia 06/03/2008   CARPAL TUNNEL SYNDROME 06/03/2008   HTN (hypertension) 06/03/2008   ALLERGIC RHINITIS 06/03/2008   Asthma 06/03/2008   GERD (gastroesophageal reflux disease) 06/03/2008   Osteopenia 06/03/2008   COLONIC POLYPS, HX OF 06/03/2008    Past Medical History:  Diagnosis Date   ALLERGIC RHINITIS    ANEMIA-NOS    Arthritis    ASTHMA    Carpal tunnel syndrome    COLONIC POLYPS, HX OF    Coronary artery disease    "mild CAD" noted on 12/05/17 in coronary CT scan   DIABETES MELLITUS, TYPE II    diet controlled   GERD    HYPERLIPIDEMIA    HYPERTENSION    OSTEOPENIA    PONV (postoperative nausea and vomiting)    Psoriasis    severe, began soriatane 01/2012   Rectal fissure    Scoliosis     Family History  Problem Relation Age of Onset   Lung cancer Father    Arthritis Other        Parents   Asthma Other        parent, other relative   Breast cancer Other        other relative   Hypertension Other        parent, other relative   Heart disease Other        parent, other relative   Heart disease Mother    Asthma Mother    Breast cancer Maternal Aunt 68   Parkinson's disease Maternal Grandmother    Rheumatic fever Maternal Grandfather    Past Surgical History:  Procedure Laterality Date   benign rectal growth  2004   removed by Dr. Zella Richer   BIOPSY  08/09/2018   Procedure: BIOPSY;  Surgeon: Carol Ada, MD;  Location: [place];  Service: Endoscopy;;   BIOPSY  08/22/2018   Procedure: BIOPSY;  Surgeon: Juanita Craver, MD;  Location[facility] END[facility];  Service: Endoscopy;;   BREAST BIOPSY Right    BREAST EXCISIONAL BIOPSY Left    BREAST SURGERY  1988   biopsy   CESAREAN SECTION     COLONOSCOPY N/A 08/22/2018   Procedure: COLONOSCOPY;  Surgeon: Juanita Craver, MD;  Location: [facility]  ENDOSCOPY;  Service: Endoscopy;  Laterality: N/A;   ESOPHAGOGASTRODUODENOSCOPY (EGD) WITH PROPOFOL N/A 08/04/2018   Procedure: ESOPHAGOGASTRODUODENOSCOPY (EGD) WITH PROPOFOL;  Surgeon: Ladene Artist, MD;  Location: [place];  Service: Gastroenterology;  Laterality: N/A;   FLEXIBLE SIGMOIDOSCOPY N/A 08/04/2018   Procedure: FLEXIBLE SIGMOIDOSCOPY;  Surgeon: Ladene Artist, MD;  Location: [place];  Service: Gastroenterology;  Laterality: N/A;   FLEXIBLE SIGMOIDOSCOPY N/A 08/09/2018   Procedure: FLEXIBLE SIGMOIDOSCOPY;  Surgeon: Carol Ada, MD;  Location: [place];  Service: Endoscopy;  Laterality: N/A;   VIDEO BRONCHOSCOPY WITH ENDOBRONCHIAL NAVIGATION N/A 03/14/2018   Procedure: VIDEO BRONCHOSCOPY WITH ENDOBRONCHIAL NAVIGATION;  Surgeon: Collene Gobble, MD;  Location: [facility] OR;  Service: Thoracic;  Laterality: N/A;   Social History   Social History Narrative   Lives alone in a one story home.  Has one child and one grandchild.  Retired Careers adviser.  Education: college.    Immunization History  Administered Date(s) Administered  Fluad Quad(high Dose 65+) 10/23/2018   Influenza Split 11/29/2010, 11/15/2011   Influenza, High Dose Seasonal PF 12/28/2012, 10/28/2015, 10/31/2016, 10/30/2017   Influenza,inj,Quad PF,6+ Mos 10/22/2013, 11/07/2014   PPD Test 08/27/2012, 11/06/2014, 01/18/2016   Pneumococcal Conjugate-13 10/22/2013   Pneumococcal Polysaccharide-23 02/14/2002, 12/23/2014   Td 02/15/2004, 06/03/2014     Objective: Vital Signs: BP 134/68 (BP Location: Right Arm, Patient Position: Sitting, Cuff Size: Normal)    Pulse 63    Resp 12    Ht 5' 0.5" (1.537 m)    Wt 120 lb 6.4 oz (54.6 kg)    BMI 23.13 kg/m    Physical Exam Vitals signs and nursing note reviewed.  Constitutional:      Appearance: She is well-developed.  HENT:     Head: Normocephalic and atraumatic.  Eyes:     Conjunctiva/sclera: Conjunctivae normal.  Neck:       Musculoskeletal: Normal range of motion.  Cardiovascular:     Rate and Rhythm: Normal rate and regular rhythm.     Heart sounds: Normal heart sounds.  Pulmonary:     Effort: Pulmonary effort is normal.     Breath sounds: Normal breath sounds.  Abdominal:     General: Bowel sounds are normal.     Palpations: Abdomen is soft.  Lymphadenopathy:     Cervical: No cervical adenopathy.  Skin:    General: Skin is warm and dry.     Capillary Refill: Capillary refill takes less than 2 seconds.  Neurological:     Mental Status: She is alert and oriented to person, place, and time.  Psychiatric:        Behavior: Behavior normal.      Musculoskeletal Exam: C-spine was in good range of motion.  She has painful range of motion of bilateral shoulders.  Elbow joints with good range of motion.  She is swelling over left wrist joint.  She has tenderness over MCP joints.  Hip joints and knee joints with good range of motion.  No MTP or ankle joint swelling was noted.  CDAI Exam: CDAI Score: -- Patient Global: --; Provider Global: -- Swollen: --; Tender: -- Joint Exam   No joint exam has been documented for this visit   There is currently no information documented on the homunculus. Go to the Rheumatology activity and complete the homunculus joint exam.  Investigation: No additional findings.  Imaging: Xr Hand 2 View Left  Result Date: 12/24/2018 No MCP PIP or DIP narrowing was noted.  No intercarpal radiocarpal joint space narrowing was noted.  No erosive changes were noted.  Generalized osteopenia was noted. Impression: Unremarkable x-ray of the hand.  Xr Hand 2 View Right  Result Date: 12/24/2018 No MCP PIP or DIP narrowing was noted.  No intercarpal radiocarpal joint space narrowing was noted.  No erosive changes were noted.  Generalized osteopenia was noted. Impression: Unremarkable x-ray of the hand.  Xr Pelvis 1-2 Views  Result Date: 12/24/2018 No SI joint narrowing or  sclerosis was noted.  Severe lumbar spine scoliosis was noted. Impression: Unremarkable x-ray of the SI joints.   Recent Labs: Lab Results  Component Value Date   WBC 8.5 12/19/2018   HGB 12.0 12/19/2018   PLT 361.0 12/19/2018   NA 137 12/19/2018   K 4.5 12/19/2018   CL 101 12/19/2018   CO2 28 12/19/2018   GLUCOSE 105 (H) 12/19/2018   BUN 17 12/19/2018   CREATININE 1.16 12/19/2018   BILITOT 0.4 12/19/2018   ALKPHOS 115 12/19/2018  AST 26 12/19/2018   ALT 26 12/19/2018   PROT 7.3 12/19/2018   ALBUMIN 4.3 12/19/2018   CALCIUM 9.6 12/19/2018   GFRAA 46 (L) 09/03/2018  December 25, 2018 AVISE index 1, ANA 1: 2560 nuclear homogeneous, double-stranded DNA positive, (Smith, RNP, anti-Ro, anti-La, SCL 70, centromere, Jo 1 -), CB CAP negative, anticardiolipin negative, beta-2 GP 1 -, RF negative, anti-CCP negative, anti-carP positive, antithyroglobulin negative, anti-TPO negative  Speciality Comments: No specialty comments available.  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  Patient was placed on prednisone 10 mg p.o. daily.  Patient states she is not sure if the prednisone is causing diarrhea.  She states she has been suffering from diarrhea for 1 week now.  She has  seen Dr. Collene Mares last week.  She states she  have follow-up visit coming up..  Detailed counseling guarding systemic lupus was provided.  She has no other clinical features of systemic lupus besides positive ANA double-stranded DNA and inflammatory arthritis.  She experiences fatigue which could be related to chronic diarrhea.  She is uncertain about the diagnosis of systemic lupus.  We also discussed that sometimes lupus  antibodies can be produced due to anti-TNF's.  She states she was diagnosed with lupus many years ago as she had positive test.  And was tried on Plaquenil but she is not sure if she could not tolerate or she discontinued it.  I discussed adding Plaquenil today.  Indications side effects contraindications were discussed.  She is concerned about the side effect of diarrhea.  We will try low-dose Plaquenil 200 mg p.o. daily.  If she cannot tolerate then she can notify me.  She had more confidence in Dr. Lyman Speller who was treating her for psoriasis with Enbrel.  Anti-car P positive.  I had detailed discussion with her that she should establish a rheumatologist at [facility] so her care can be better coordinated with Dr. Lyman Speller.  She is ready to go there for a few visits.  I will make the referral for her.  She needs assurance about her diagnosis and also the treatment plan.  She does not have much confidence in the treatment we discussed.  She will need labs in 1 month after starting Plaquenil and then 3 months.  I will taper prednisone gradually.  We will start prednisone 10 mg p.o. daily and taper by 2.5 mg every week.- Plan: Ambulatory referral to Rheumatology  High risk medication use-we will check labs in 1 month and 3 months.  Psoriasis-patient gives history of palmar and plantar psoriasis.  She has dry skin currently.  Treated by Dr. Lyman Speller with Enbrel.  If her psoriasis flares Orencia could be an option.  As anti-TNF's can produce autoimmune antibodies.  Osteopenia of forearm, unspecified laterality-she is takes calcium and vitamin D.  Vitamin D deficiency  Chronic midline low back pain without sciatica-currently the pain is manageable.  Other secondary scoliosis, unspecified spinal region  Essential hypertension-her systolic blood pressure is mildly elevated.  Controlled type 2 diabetes mellitus without complication, unspecified whether long term insulin use (HCC)-she will have to  monitor blood sugar closely on prednisone.  Gastroesophageal reflux disease, unspecified whether esophagitis present  Bilateral carotid artery disease, unspecified type ([place])  Small vessel disease, cerebrovascular  Other  chronic pulmonary embolism without acute cor pulmonale (HCC)  Multiple pulmonary nodules determined by computed tomography of lung  Thyroid nodule  Pancreatic atrophy  Diarrhea, unspecified type-patient states she never had diarrhea in the past.  She had an episode of diarrhea in June and now the diarrhea has recurred.  She was seen by Dr. Collene Mares last week.  She states none of the medications are helping her.  She is very much concerned about the diarrhea.  Orders: Orders Placed This Encounter  Procedures   CBC with Differential/Platelet   COMPLETE METABOLIC PANEL WITH GFR   Ambulatory referral to Rheumatology   Meds ordered this encounter  Medications   hydroxychloroquine (PLAQUENIL) 200 MG tablet    Sig: Take 1 tablet (200 mg total) by mouth daily.    Dispense:  30 tablet    Refill:  2   predniSONE (DELTASONE) 5 MG tablet    Sig: Take 2 tablets (10 mg total) by mouth daily with breakfast for 7 days, THEN 1.5 tablets (7.5 mg total) daily with breakfast for 7 days, THEN 1 tablet (5 mg total) daily with breakfast for 7 days, THEN 0.5 tablets (2.5 mg total) daily with breakfast for 7 days.    Dispense:  35 tablet    Refill:  0    Face-to-face time spent with patient was 40 minutes. Greater than 50% of time was spent in counseling and coordination of care.  Follow-Up Instructions: Return in about 4 weeks (around 01/31/2019) for SLE, psoriasis.   Bo Merino, MD  Note - This record has been created using Editor, commissioning.  Chart creation errors have been sought, but may not always  have been located. Such creation errors do not reflect on  the standard of medical care.

## 2019-01-02 ENCOUNTER — Ambulatory Visit (INDEPENDENT_AMBULATORY_CARE_PROVIDER_SITE_OTHER): Payer: Medicare Other | Admitting: Internal Medicine

## 2019-01-02 ENCOUNTER — Encounter: Payer: Self-pay | Admitting: Internal Medicine

## 2019-01-02 ENCOUNTER — Other Ambulatory Visit: Payer: Self-pay

## 2019-01-02 DIAGNOSIS — R768 Other specified abnormal immunological findings in serum: Secondary | ICD-10-CM | POA: Insufficient documentation

## 2019-01-02 DIAGNOSIS — M1990 Unspecified osteoarthritis, unspecified site: Secondary | ICD-10-CM | POA: Insufficient documentation

## 2019-01-02 NOTE — Progress Notes (Signed)
[facility]  Reason for Consult: Pulmonary nodules Referring Provider: Dr. Bo Merino  Assessment: The cause of her bilateral lung nodules remains uncertain.  She has no evidence of active pneumonia.  Sputum AFB cultures have been negative on 2 occasions over the past year and her most recent CT scan shows some decrease in size of the nodules.  I certainly would not recommend empiric treatment for possible Mycobacterium avium or other pneumonias at this time.  If she does need to start back on prednisone and/or Enbrel we will need to watch closely for development of any signs or symptoms of pneumonia that might require further evaluation.  Plan: 1. Observe off of antibiotics for now.  I asked her to follow-up here with me as needed.  Patient Active Problem List   Diagnosis Date Noted   Inflammatory arthritis 01/02/2019    Priority: High   Multiple pulmonary nodules determined by computed tomography of lung 09/18/2018    Priority: High   Positive ANA (antinuclear antibody) 01/02/2019    Priority: Medium   Pancreatic insufficiency 10/23/2018    Priority: Medium   Psoriasis     Priority: Medium   Decreased GFR 08/21/2018   Nausea vomiting and diarrhea 08/20/2018   Syncope 08/01/2018   Hypokalemia 08/01/2018   Abnormal CT scan, pelvis 07/31/2018   Pneumothorax 03/14/2018   Lung abnormality 01/11/2018   Pulmonary embolism ([place]) 12/24/2017   Lung mass 12/24/2017   Small vessel disease, cerebrovascular 07/31/2017   Atypical chest pain 06/14/2017   Dizziness 06/14/2017   Hyperuricemia 05/03/2017   Arthralgia 05/01/2017   Hair loss 05/01/2017   Vitamin D deficiency 10/31/2016   Diarrhea 97/67/3419   Diastolic dysfunction 37/90/2409   Vertigo 12/24/2015   Nonspecific abnormal electrocardiogram (ECG) (EKG) 12/24/2015   Bilateral carotid artery disease, Mild 05/31/2015   Thyroid nodule 05/31/2015   Scoliosis      Diabetes type 2, controlled ([place]) 06/03/2008   Dyslipidemia 06/03/2008   CARPAL TUNNEL SYNDROME 06/03/2008   HTN (hypertension) 06/03/2008   ALLERGIC RHINITIS 06/03/2008   Asthma 06/03/2008   GERD (gastroesophageal reflux disease) 06/03/2008   Osteopenia 06/03/2008   COLONIC POLYPS, HX OF 06/03/2008    Patient's Medications  New Prescriptions   No medications on file  Previous Medications   AZELAIC ACID 15 % CREAM    After skin is thoroughly washed and patted dry, gently but thoroughly massage a thin film of gel into the affected area twice daily.   BENICAR 20 MG TABLET    Take one tablet daily   CALCIUM CARBONATE ANTACID (TUMS CHEWY BITES PO)    Take by mouth. After each meal   CLINDAMYCIN (CLEOCIN T) 1 % EXTERNAL SOLUTION    as needed.   CLOBETASOL (TEMOVATE) 0.05 % EXTERNAL SOLUTION    Apply topically.   HALOBETASOL (ULTRAVATE) 0.05 % CREAM    Apply topically.   LIPASE/PROTEASE/AMYLASE (CREON) 36000 UNITS CPEP CAPSULE    Take 1 capsule (36,000 Units total) by mouth 3 (three) times daily with meals.   NEBIVOLOL (BYSTOLIC) 2.5 MG TABLET    TAKE ONE (1) TABLET BY MOUTH EVERY DAY   OMEPRAZOLE (PRILOSEC) 20 MG CAPSULE    Take 20 mg by mouth daily.   ONDANSETRON (ZOFRAN) 4 MG TABLET    Take 1 tablet (4 mg total) by mouth every 8 (eight) hours as needed for nausea or vomiting.   ONETOUCH ULTRA TEST STRIP    USE TEST STRIPS TO  CHECK BLOOD SUGAR AT LEAST 3 TIMES DAILY   PREDNISONE (DELTASONE) 10 MG TABLET    Take 1 tablet (10 mg total) by mouth daily with breakfast.   RIVAROXABAN (XARELTO) 15 MG TABS TABLET    Take 1 tablet (15 mg total) by mouth daily with supper.  Modified Medications   No medications on file  Discontinued Medications   No medications on file    HPI: [name] is a 73 y.o. female multiple medical problems.  He underwent a chest CT scan late last year which revealed bilateral pulmonary nodules and an acute pulmonary embolus.  She was anticoagulated.   She had no fever, new cough or shortness of breath at that time.  She underwent a PET scan last November and the lung nodules were felt to probably be benign.  Sputum AFB smear was negative and AFB cultures were negative in late December.  She underwent bronchoscopy on 03/14/2018.  Lung biopsy showed benign lung tissue.  2 specimens were sent for cytology.  One was negative.  The other showed atypical cells.  Repeat AFB smear and culture were negative again.  She was hospitalized this past summer with severe diarrhea and renal insufficiency.  She had an extensive evaluation and was felt to have probable pancreatic insufficiency.  She was started on pancreatic enzymes with improvement.  She had a follow-up chest CT scan in July which showed the lung nodules were slightly improved.  She saw Dr. Clois Comber on 09/17/2018.  He said that he thought the nodules could possibly be caused by Mycobacterium avium but did not feel that any further work-up or treatment were needed since she was asymptomatic and the nodules were improving.  She has remote history of positive ANA and psoriasis.  She had been on Enbrel until she was hospitalized this past summer when it was stopped.  For the past 2 months she has developed acute, severe joint pain, redness and swelling.  This is most noticeable in her hands, arms and shoulders.  Blood work showed a positive ANA at a titer of 1:1280 in a homogenous pattern.  RF and anti-CCP antibodies were negative.  Her sed rate was elevated at 42 and her C-reactive protein was elevated at 1.2.  She was started on prednisone 5 mg daily on 12/19/2018 with some relief of her joint pain.  She was seen by Dr. Estanislado Pandy on 12/24/2018.  She felt that she probably had some form of inflammatory arthritis caused by psoriasis or some other autoimmune illness driven by her positive ANA.  She increased the prednisone to 10 mg daily and wrote that she was considering having her restart Enbrel.  She was  referred here because of ongoing concerns that she could have smoldering pneumonia and may need to be treated with immunosuppressive agents.  She has had annual QuantiFERON testing for latent TB for many years and the tests have always been negative.  Several days ago she developed recurrent severe diarrhea and nausea.  She was concerned that it might be due to the prednisone and stopped it 3 days ago.  She saw her gastroenterologist, Dr. Collene Mares yesterday who ordered some more stool studies and had her start Lomotil.  Review of Systems: Review of Systems  Constitutional: Positive for malaise/fatigue and weight loss. Negative for chills, diaphoresis and fever.       She estimates that she has lost 2 to 3 pounds recently because of her difficulty with nausea and diarrhea.  HENT: Negative for congestion  and sore throat.   Respiratory: Positive for shortness of breath. Negative for cough and sputum production.        She has chronic, stable dyspnea on exertion.  Cardiovascular: Negative for chest pain.  Gastrointestinal: Positive for diarrhea and nausea. Negative for abdominal pain, heartburn and vomiting.  Genitourinary: Negative for dysuria.  Musculoskeletal: Positive for joint pain.  Skin: Negative for rash.      Past Medical History:  Diagnosis Date   ALLERGIC RHINITIS    ANEMIA-NOS    Arthritis    ASTHMA    Carpal tunnel syndrome    COLONIC POLYPS, HX OF    Coronary artery disease    "mild CAD" noted on 12/05/17 in coronary CT scan   DIABETES MELLITUS, TYPE II    diet controlled   GERD    HYPERLIPIDEMIA    HYPERTENSION    OSTEOPENIA    PONV (postoperative nausea and vomiting)    Psoriasis    severe, began soriatane 01/2012   Rectal fissure    Scoliosis     Social History   Tobacco Use   Smoking status: Former Smoker    Packs/day: 1.00    Years: 10.00    Pack years: 10.00    Quit date: 02/14/1974    Years since quitting: 44.9   Smokeless tobacco:  Never Used  Substance Use Topics   Alcohol use: No   Drug use: No    Family History  Problem Relation Age of Onset   Lung cancer Father    Arthritis Other        Parents   Asthma Other        parent, other relative   Breast cancer Other        other relative   Hypertension Other        parent, other relative   Heart disease Other        parent, other relative   Heart disease Mother    Asthma Mother    Breast cancer Maternal Aunt 69   Parkinson's disease Maternal Grandmother    Rheumatic fever Maternal Grandfather    Allergies  Allergen Reactions   Phenergan [Promethazine Hcl] Shortness Of Breath and Anxiety   Amlodipine Swelling   Clarithromycin Nausea Only    I can pass out    Codeine Nausea Only    I can pass out    Erythromycin     Other reaction(s): Vomiting (intolerance)   Statins     Muscle pain    Tetracycline Other (See Comments)    unknown    OBJECTIVE: Vitals:   01/02/19 1454  BP: (!) 155/78  Pulse: 71  Weight: 122 lb (55.3 kg)   Body mass index is 23.43 kg/m.   Physical Exam Constitutional:      Comments: She is pleasant and talkative.  She has lots of notes.  Cardiovascular:     Rate and Rhythm: Normal rate and regular rhythm.     Heart sounds: No murmur.  Pulmonary:     Effort: Pulmonary effort is normal.     Breath sounds: Normal breath sounds.  Abdominal:     Palpations: Abdomen is soft.     Tenderness: There is no abdominal tenderness.  Musculoskeletal:        General: Swelling and tenderness present.  Skin:    Findings: No rash.  Neurological:     General: No focal deficit present.  Psychiatric:        Mood and Affect: Mood normal.  Microbiology: No results found for this or any previous visit (from the past 240 hour(s)).  Michel Bickers, MD [facility] [phone] pager   [phone] cell 01/02/2019, 3:36 PM

## 2019-01-03 ENCOUNTER — Encounter: Payer: Self-pay | Admitting: Rheumatology

## 2019-01-03 ENCOUNTER — Ambulatory Visit (INDEPENDENT_AMBULATORY_CARE_PROVIDER_SITE_OTHER): Payer: Medicare Other | Admitting: Rheumatology

## 2019-01-03 VITALS — BP 134/68 | HR 63 | Resp 12 | Ht 60.5 in | Wt 120.4 lb

## 2019-01-03 DIAGNOSIS — R197 Diarrhea, unspecified: Secondary | ICD-10-CM

## 2019-01-03 DIAGNOSIS — M3219 Other organ or system involvement in systemic lupus erythematosus: Secondary | ICD-10-CM

## 2019-01-03 DIAGNOSIS — L409 Psoriasis, unspecified: Secondary | ICD-10-CM | POA: Diagnosis not present

## 2019-01-03 DIAGNOSIS — M85839 Other specified disorders of bone density and structure, unspecified forearm: Secondary | ICD-10-CM

## 2019-01-03 DIAGNOSIS — R918 Other nonspecific abnormal finding of lung field: Secondary | ICD-10-CM

## 2019-01-03 DIAGNOSIS — E119 Type 2 diabetes mellitus without complications: Secondary | ICD-10-CM

## 2019-01-03 DIAGNOSIS — I679 Cerebrovascular disease, unspecified: Secondary | ICD-10-CM

## 2019-01-03 DIAGNOSIS — I779 Disorder of arteries and arterioles, unspecified: Secondary | ICD-10-CM

## 2019-01-03 DIAGNOSIS — I2782 Chronic pulmonary embolism: Secondary | ICD-10-CM

## 2019-01-03 DIAGNOSIS — I10 Essential (primary) hypertension: Secondary | ICD-10-CM

## 2019-01-03 DIAGNOSIS — K219 Gastro-esophageal reflux disease without esophagitis: Secondary | ICD-10-CM

## 2019-01-03 DIAGNOSIS — E041 Nontoxic single thyroid nodule: Secondary | ICD-10-CM

## 2019-01-03 DIAGNOSIS — M4150 Other secondary scoliosis, site unspecified: Secondary | ICD-10-CM

## 2019-01-03 DIAGNOSIS — Z79899 Other long term (current) drug therapy: Secondary | ICD-10-CM | POA: Diagnosis not present

## 2019-01-03 DIAGNOSIS — K8689 Other specified diseases of pancreas: Secondary | ICD-10-CM

## 2019-01-03 DIAGNOSIS — G8929 Other chronic pain: Secondary | ICD-10-CM

## 2019-01-03 DIAGNOSIS — E559 Vitamin D deficiency, unspecified: Secondary | ICD-10-CM

## 2019-01-03 DIAGNOSIS — M545 Low back pain: Secondary | ICD-10-CM

## 2019-01-03 MED ORDER — PREDNISONE 5 MG PO TABS: ORAL_TABLET | ORAL | 0 refills | Status: DC

## 2019-01-03 MED ORDER — HYDROXYCHLOROQUINE SULFATE 200 MG PO TABS: 200.0000 mg | ORAL_TABLET | Freq: Every day | ORAL | 2 refills | Status: DC

## 2019-01-03 NOTE — Progress Notes (Signed)
Pharmacy Note  Subjective: Patient presents today to [facility] for follow up office visit.   Patient seen by the pharmacist for counseling on hydroxychloroquine for lupus.  Objective: CMP     Component Value Date/Time   NA 137 12/19/2018 1118   NA 141 09/03/2018 0824   K 4.5 12/19/2018 1118   CL 101 12/19/2018 1118   CO2 28 12/19/2018 1118   GLUCOSE 105 (H) 12/19/2018 1118   BUN 17 12/19/2018 1118   BUN 8 09/03/2018 0824   CREATININE 1.16 12/19/2018 1118   CREATININE 0.96 12/19/2017 1327   CALCIUM 9.6 12/19/2018 1118   PROT 7.3 12/19/2018 1118   PROT 6.6 09/03/2018 0824   ALBUMIN 4.3 12/19/2018 1118   ALBUMIN 3.8 09/03/2018 0824   AST 26 12/19/2018 1118   AST 18 12/19/2017 1327   ALT 26 12/19/2018 1118   ALT 23 12/19/2017 1327   ALKPHOS 115 12/19/2018 1118   BILITOT 0.4 12/19/2018 1118   BILITOT 0.4 09/03/2018 0824   BILITOT 0.5 12/19/2017 1327   GFRNONAA 40 (L) 09/03/2018 0824   GFRNONAA 57 (L) 12/19/2017 1327   GFRAA 46 (L) 09/03/2018 0824   GFRAA >60 12/19/2017 1327    CBC    Component Value Date/Time   WBC 8.5 12/19/2018 1118   RBC 3.86 (L) 12/19/2018 1118   HGB 12.0 12/19/2018 1118   HGB 12.7 02/22/2018 0956   HCT 36.4 12/19/2018 1118   HCT 37.8 02/22/2018 0956   PLT 361.0 12/19/2018 1118   PLT 330 02/22/2018 0956   MCV 94.3 12/19/2018 1118   MCV 93 02/22/2018 0956   MCH 31.6 08/25/2018 0458   MCHC 33.0 12/19/2018 1118   RDW 13.8 12/19/2018 1118   RDW 13.1 02/22/2018 0956   LYMPHSABS 1.6 12/19/2018 1118   LYMPHSABS 2.9 02/22/2018 0956   MONOABS 0.6 12/19/2018 1118   EOSABS 0.1 12/19/2018 1118   EOSABS 0.3 02/22/2018 0956   BASOSABS 0.1 12/19/2018 1118   BASOSABS 0.1 02/22/2018 0956    Assessment/Plan: Patient was counseled on the purpose, proper use, and adverse effects of hydroxychloroquine including nausea/diarrhea, skin rash, headaches, and sun sensitivity.  Discussed importance of annual eye exams while on hydroxychloroquine to  monitor to ocular toxicity and discussed importance of frequent laboratory monitoring.  Provided patient with eye exam form for baseline ophthalmologic exam and standing lab instructions.  Provided patient with educational materials on hydroxychloroquine and answered all questions.  Patient consented to hydroxychloroquine.  Will upload consent in the media tab.    Dose will be Plaquenil 200 mg once daily based on weight of 54.6 kg and height of 5'.   She is also to start prednisone taper of 10 mg and decrease by 2.5 mg every 7 days.  All questions encouraged and answered.  Instructed patient to call with any other questions or concerns.  Mariella Saa, PharmD, [place] [phone]  01/03/2019 11:07 AM

## 2019-01-04 ENCOUNTER — Other Ambulatory Visit: Payer: Self-pay

## 2019-01-04 ENCOUNTER — Encounter (HOSPITAL_COMMUNITY): Payer: Self-pay | Admitting: Emergency Medicine

## 2019-01-04 ENCOUNTER — Inpatient Hospital Stay (HOSPITAL_COMMUNITY)
Admission: EM | Admit: 2019-01-04 | Discharge: 2019-01-12 | DRG: 439 | Disposition: A | Discharge status: Home / Self Care | Payer: Medicare Other | Attending: Internal Medicine | Admitting: Internal Medicine

## 2019-01-04 DIAGNOSIS — K589 Irritable bowel syndrome without diarrhea: Secondary | ICD-10-CM | POA: Diagnosis present

## 2019-01-04 DIAGNOSIS — E1159 Type 2 diabetes mellitus with other circulatory complications: Secondary | ICD-10-CM | POA: Diagnosis present

## 2019-01-04 DIAGNOSIS — K219 Gastro-esophageal reflux disease without esophagitis: Secondary | ICD-10-CM | POA: Diagnosis present

## 2019-01-04 DIAGNOSIS — E860 Dehydration: Secondary | ICD-10-CM | POA: Diagnosis present

## 2019-01-04 DIAGNOSIS — Z7901 Long term (current) use of anticoagulants: Secondary | ICD-10-CM

## 2019-01-04 DIAGNOSIS — I2782 Chronic pulmonary embolism: Secondary | ICD-10-CM | POA: Diagnosis not present

## 2019-01-04 DIAGNOSIS — I6529 Occlusion and stenosis of unspecified carotid artery: Secondary | ICD-10-CM | POA: Diagnosis present

## 2019-01-04 DIAGNOSIS — R197 Diarrhea, unspecified: Secondary | ICD-10-CM | POA: Diagnosis present

## 2019-01-04 DIAGNOSIS — Z87891 Personal history of nicotine dependence: Secondary | ICD-10-CM

## 2019-01-04 DIAGNOSIS — E871 Hypo-osmolality and hyponatremia: Secondary | ICD-10-CM | POA: Diagnosis present

## 2019-01-04 DIAGNOSIS — F419 Anxiety disorder, unspecified: Secondary | ICD-10-CM | POA: Diagnosis present

## 2019-01-04 DIAGNOSIS — L4050 Arthropathic psoriasis, unspecified: Secondary | ICD-10-CM | POA: Diagnosis present

## 2019-01-04 DIAGNOSIS — E785 Hyperlipidemia, unspecified: Secondary | ICD-10-CM | POA: Diagnosis present

## 2019-01-04 DIAGNOSIS — E872 Acidosis: Secondary | ICD-10-CM | POA: Diagnosis present

## 2019-01-04 DIAGNOSIS — J45909 Unspecified asthma, uncomplicated: Secondary | ICD-10-CM | POA: Diagnosis present

## 2019-01-04 DIAGNOSIS — N179 Acute kidney failure, unspecified: Secondary | ICD-10-CM | POA: Diagnosis present

## 2019-01-04 DIAGNOSIS — M1990 Unspecified osteoarthritis, unspecified site: Secondary | ICD-10-CM | POA: Diagnosis not present

## 2019-01-04 DIAGNOSIS — F605 Obsessive-compulsive personality disorder: Secondary | ICD-10-CM | POA: Diagnosis present

## 2019-01-04 DIAGNOSIS — M329 Systemic lupus erythematosus, unspecified: Secondary | ICD-10-CM | POA: Diagnosis present

## 2019-01-04 DIAGNOSIS — I2510 Atherosclerotic heart disease of native coronary artery without angina pectoris: Secondary | ICD-10-CM | POA: Diagnosis present

## 2019-01-04 DIAGNOSIS — Z20828 Contact with and (suspected) exposure to other viral communicable diseases: Secondary | ICD-10-CM | POA: Diagnosis present

## 2019-01-04 DIAGNOSIS — D638 Anemia in other chronic diseases classified elsewhere: Secondary | ICD-10-CM | POA: Diagnosis present

## 2019-01-04 DIAGNOSIS — I10 Essential (primary) hypertension: Secondary | ICD-10-CM | POA: Diagnosis present

## 2019-01-04 DIAGNOSIS — E876 Hypokalemia: Secondary | ICD-10-CM | POA: Diagnosis present

## 2019-01-04 DIAGNOSIS — E119 Type 2 diabetes mellitus without complications: Secondary | ICD-10-CM | POA: Diagnosis present

## 2019-01-04 DIAGNOSIS — K8681 Exocrine pancreatic insufficiency: Principal | ICD-10-CM | POA: Diagnosis present

## 2019-01-04 DIAGNOSIS — E861 Hypovolemia: Secondary | ICD-10-CM | POA: Diagnosis present

## 2019-01-04 DIAGNOSIS — I2699 Other pulmonary embolism without acute cor pulmonale: Secondary | ICD-10-CM | POA: Diagnosis present

## 2019-01-04 DIAGNOSIS — Z86711 Personal history of pulmonary embolism: Secondary | ICD-10-CM | POA: Diagnosis not present

## 2019-01-04 DIAGNOSIS — M1380 Other specified arthritis, unspecified site: Secondary | ICD-10-CM | POA: Diagnosis present

## 2019-01-04 DIAGNOSIS — E1122 Type 2 diabetes mellitus with diabetic chronic kidney disease: Secondary | ICD-10-CM

## 2019-01-04 DIAGNOSIS — Z79899 Other long term (current) drug therapy: Secondary | ICD-10-CM

## 2019-01-04 DIAGNOSIS — R112 Nausea with vomiting, unspecified: Secondary | ICD-10-CM | POA: Diagnosis present

## 2019-01-04 DIAGNOSIS — M064 Inflammatory polyarthropathy: Secondary | ICD-10-CM | POA: Diagnosis present

## 2019-01-04 LAB — COMPREHENSIVE METABOLIC PANEL
ALT: 21 U/L (ref 0–44)
AST: 17 U/L (ref 15–41)
Albumin: 3.5 g/dL (ref 3.5–5.0)
Alkaline Phosphatase: 81 U/L (ref 38–126)
Anion gap: 10 (ref 5–15)
BUN: 15 mg/dL (ref 8–23)
CO2: 18 mmol/L — ABNORMAL LOW (ref 22–32)
Calcium: 8.9 mg/dL (ref 8.9–10.3)
Chloride: 101 mmol/L (ref 98–111)
Creatinine, Ser: 1.55 mg/dL — ABNORMAL HIGH (ref 0.44–1.00)
GFR calc Af Amer: 38 mL/min — ABNORMAL LOW (ref >60)
GFR calc non Af Amer: 32 mL/min — ABNORMAL LOW (ref >60)
Glucose, Bld: 95 mg/dL (ref 70–99)
Potassium: 3.5 mmol/L (ref 3.5–5.1)
Sodium: 129 mmol/L — ABNORMAL LOW (ref 135–145)
Total Bilirubin: 1 mg/dL (ref 0.3–1.2)
Total Protein: 6.9 g/dL (ref 6.5–8.1)

## 2019-01-04 LAB — CBC
HCT: 33.6 % — ABNORMAL LOW (ref 36.0–46.0)
Hemoglobin: 11 g/dL — ABNORMAL LOW (ref 12.0–15.0)
MCH: 31.4 pg (ref 26.0–34.0)
MCHC: 32.7 g/dL (ref 30.0–36.0)
MCV: 96 fL (ref 80.0–100.0)
Platelets: 344 10*3/uL (ref 150–400)
RBC: 3.5 MIL/uL — ABNORMAL LOW (ref 3.87–5.11)
RDW: 13.1 % (ref 11.5–15.5)
WBC: 7.9 10*3/uL (ref 4.0–10.5)
nRBC: 0 % (ref 0.0–0.2)

## 2019-01-04 LAB — CBG MONITORING, ED: Glucose-Capillary: 81 mg/dL (ref 70–99)

## 2019-01-04 LAB — URINALYSIS, ROUTINE W REFLEX MICROSCOPIC
Bacteria, UA: NONE SEEN
Bilirubin Urine: NEGATIVE
Glucose, UA: NEGATIVE mg/dL
Ketones, ur: NEGATIVE mg/dL
Leukocytes,Ua: NEGATIVE
Nitrite: NEGATIVE
Protein, ur: NEGATIVE mg/dL
Specific Gravity, Urine: 1.005 (ref 1.005–1.030)
pH: 5 (ref 5.0–8.0)

## 2019-01-04 LAB — C DIFFICILE QUICK SCREEN W PCR REFLEX
C Diff antigen: NEGATIVE
C Diff interpretation: NOT DETECTED
C Diff toxin: NEGATIVE

## 2019-01-04 LAB — LIPASE, BLOOD: Lipase: 17 U/L (ref 11–51)

## 2019-01-04 LAB — APTT: aPTT: 40 seconds — ABNORMAL HIGH (ref 24–36)

## 2019-01-04 LAB — SODIUM, URINE, RANDOM: Sodium, Ur: <10 mmol/L

## 2019-01-04 LAB — HEPARIN LEVEL (UNFRACTIONATED): Heparin Unfractionated: 0.3 IU/mL (ref 0.30–0.70)

## 2019-01-04 LAB — CREATININE, URINE, RANDOM: Creatinine, Urine: 66.67 mg/dL

## 2019-01-04 MED ORDER — HEPARIN (PORCINE) 25000 UT/250ML-% IV SOLN
1000.0000 [IU]/h | INTRAVENOUS | Status: AC
  Administered 2019-01-04 – 2019-01-05 (×2): 900 [IU]/h via INTRAVENOUS
  Filled 2019-01-04: qty 250

## 2019-01-04 MED ORDER — DIPHENOXYLATE-ATROPINE 2.5-0.025 MG PO TABS
1.0000 | ORAL_TABLET | Freq: Once | ORAL | Status: AC
  Administered 2019-01-04: 1 via ORAL
  Filled 2019-01-04: qty 1

## 2019-01-04 MED ORDER — HEPARIN SODIUM (PORCINE) 5000 UNIT/ML IJ SOLN: 5000.0000 [IU] | Freq: Three times a day (TID) | INTRAMUSCULAR | Status: DC

## 2019-01-04 MED ORDER — SODIUM CHLORIDE 0.9 % IV SOLN
INTRAVENOUS | Status: DC
  Administered 2019-01-04 – 2019-01-07 (×5): via INTRAVENOUS

## 2019-01-04 MED ORDER — PANTOPRAZOLE SODIUM 40 MG PO TBEC: 40.0000 mg | DELAYED_RELEASE_TABLET | Freq: Every day | ORAL | Status: DC

## 2019-01-04 MED ORDER — INSULIN ASPART 100 UNIT/ML ~~LOC~~ SOLN: 0.0000 [IU] | Freq: Three times a day (TID) | SUBCUTANEOUS | Status: DC

## 2019-01-04 MED ORDER — DIPHENOXYLATE-ATROPINE 2.5-0.025 MG PO TABS
1.0000 | ORAL_TABLET | Freq: Three times a day (TID) | ORAL | Status: DC
  Administered 2019-01-04 – 2019-01-06 (×7): 1 via ORAL
  Filled 2019-01-04 (×7): qty 1

## 2019-01-04 MED ORDER — NEBIVOLOL HCL 2.5 MG PO TABS
2.5000 mg | ORAL_TABLET | Freq: Every day | ORAL | Status: DC
  Administered 2019-01-05 – 2019-01-12 (×8): 2.5 mg via ORAL
  Filled 2019-01-04 (×8): qty 1

## 2019-01-04 MED ORDER — SODIUM CHLORIDE 0.9 % IV BOLUS
1000.0000 mL | Freq: Once | INTRAVENOUS | Status: AC
  Administered 2019-01-04: 1000 mL via INTRAVENOUS

## 2019-01-04 MED ORDER — PANCRELIPASE (LIP-PROT-AMYL) 12000-38000 UNITS PO CPEP
36000.0000 [IU] | ORAL_CAPSULE | Freq: Three times a day (TID) | ORAL | Status: DC
  Administered 2019-01-05 – 2019-01-06 (×4): 36000 [IU] via ORAL
  Filled 2019-01-04 (×3): qty 3
  Filled 2019-01-04: qty 1
  Filled 2019-01-04: qty 3

## 2019-01-04 MED ORDER — ONDANSETRON HCL 4 MG/2ML IJ SOLN
4.0000 mg | Freq: Once | INTRAMUSCULAR | Status: AC
  Administered 2019-01-04: 4 mg via INTRAVENOUS
  Filled 2019-01-04: qty 2

## 2019-01-04 NOTE — ED Triage Notes (Signed)
Pt states she has had continued diarrhea and vomiting. Reports she is so exhausted and feels dehydrated.

## 2019-01-04 NOTE — H&P (Signed)
History and Physical    [name] DOB: May 04, 1945 DOA: 01/04/2019  PCP: [name], MD  Patient coming from: Home   I have personally briefly reviewed patient's old medical records in [place]  Chief Complaint: persistent diarrhea  HPI: [name] is a 73 y.o. female with medical history significant of pulmonary embolism on Xarelto, bilateral carotid stenosis, systemic lupus, asthma, pulmonary nodules, pancreatic insufficiency in chronic diarrhea who presents with concerns of persistent diarrhea.  Patient states that for the past 7 days she has been having about 7-8 episodes of diarrhea every day despite taking Lomotil twice daily and has been unable to stay hydrated.  She recently saw her GI this week for this problem and was started on probiotic but discontinued it after two doses since it made her vomit.  She then saw them a second time yesterday and had stool studies done that are still pending. 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 She believes the diarrhea started at the same time she started taking her prednisone.  Patient's overall issue with diarrhea started back in June and she was hospitalized in June and July for the same and also had acute renal failure for dehydration.  She had a largely extensive work-up including C. Difficile, GI pathogen panel, upper and lower endoscopy, CT abd/pelvis that was negative at that time.  Also underwent colonoscopy on 08/22/2018 with negative biopsy. She has since followed with outpatient GI Dr. Collene Mares and was started on Creon in July and had some improvement to her symptoms.    ED Course: She was afebrile and normotensive on room air. CBC showed no leukocytosis and mild anemia of 11. CMP with sodium of 129, potassium of 3.5, glucose of 95, creatinine of 1.55 and GFR of 32.  She  was given Lotmotil, Zofran and 1 L NS fluid in the ED.   Review of Systems: Constitutional: No Weight Change, No Fever ENT/Mouth: No sore throat, No Rhinorrhea Eyes: No Eye Pain, No Vision Changes Cardiovascular: No Chest Pain, no SOB Respiratory: No Cough, No Sputum, No Wheezing, no Dyspnea  Gastrointestinal: No Nausea, No Vomiting, + Diarrhea, No Constipation, No Pain Genitourinary: no Urinary Incontinence, No Urgency, No Flank Pain Musculoskeletal: No Arthralgias, No Myalgias Skin: No Skin Lesions, No Pruritus, Neuro: no Weakness, No Numbness,  No Loss of Consciousness, No Syncope Psych: No Anxiety/Panic, No Depression, no decrease appetite Heme/Lymph: No Bruising, No Bleeding  Past Medical History:  Diagnosis Date  . ALLERGIC RHINITIS   . ANEMIA-NOS   . Arthritis   . ASTHMA   . Carpal tunnel syndrome   . COLONIC POLYPS, HX OF   . Coronary artery disease    "mild CAD" noted on 12/05/17 in coronary CT scan  . DIABETES MELLITUS, TYPE II    diet controlled  . GERD   . HYPERLIPIDEMIA   . HYPERTENSION   . OSTEOPENIA   . PONV (postoperative nausea and vomiting)   . Psoriasis    severe, began soriatane 01/2012  . Rectal fissure   . Scoliosis     Past Surgical History:  Procedure Laterality Date  . benign rectal growth  2004   removed by Dr. Zella Richer  . BIOPSY  08/09/2018   Procedure: BIOPSY;  Surgeon: Carol Ada, MD;  Location: [facility] ENDOSCOPY;  Service: Endoscopy;;  . BIOPSY  08/22/2018  Procedure: BIOPSY;  Surgeon: Juanita Craver, MD;  Location: [facility] ENDOSCOPY;  Service: Endoscopy;;  . BREAST BIOPSY Right   . BREAST EXCISIONAL BIOPSY Left   . BREAST SURGERY  1988   biopsy  . CESAREAN SECTION    . COLONOSCOPY N/A 08/22/2018   Procedure: COLONOSCOPY;  Surgeon: Juanita Craver, MD;  Location: [facility] ENDOSCOPY;  Service: Endoscopy;  Laterality: N/A;  . ESOPHAGOGASTRODUODENOSCOPY (EGD) WITH PROPOFOL N/A 08/04/2018   Procedure: ESOPHAGOGASTRODUODENOSCOPY (EGD) WITH PROPOFOL;   Surgeon: Ladene Artist, MD;  Location: [place];  Service: Gastroenterology;  Laterality: N/A;  . FLEXIBLE SIGMOIDOSCOPY N/A 08/04/2018   Procedure: FLEXIBLE SIGMOIDOSCOPY;  Surgeon: Ladene Artist, MD;  Location: [place];  Service: Gastroenterology;  Laterality: N/A;  . FLEXIBLE SIGMOIDOSCOPY N/A 08/09/2018   Procedure: FLEXIBLE SIGMOIDOSCOPY;  Surgeon: Carol Ada, MD;  Location: [place];  Service: Endoscopy;  Laterality: N/A;  . VIDEO BRONCHOSCOPY WITH ENDOBRONCHIAL NAVIGATION N/A 03/14/2018   Procedure: VIDEO BRONCHOSCOPY WITH ENDOBRONCHIAL NAVIGATION;  Surgeon: Collene Gobble, MD;  Location: [place];  Service: Thoracic;  Laterality: N/A;     reports that she quit smoking about 44 years ago. She has a 10.00 pack-year smoking history. She has never used smokeless tobacco. She reports that she does not drink alcohol or use drugs.  Allergies  Allergen Reactions  . Phenergan [Promethazine Hcl] Shortness Of Breath and Anxiety  . Amlodipine Swelling  . Clarithromycin Nausea Only    I can pass out   . Codeine Nausea Only    I can pass out   . Erythromycin     Other reaction(s): Vomiting (intolerance)  . Statins     Muscle pain   . Tetracycline Other (See Comments)    unknown    Family History  Problem Relation Age of Onset  . Lung cancer Father   . Arthritis Other        Parents  . Asthma Other        parent, other relative  . Breast cancer Other        other relative  . Hypertension Other        parent, other relative  . Heart disease Other        parent, other relative  . Heart disease Mother   . Asthma Mother   . Breast cancer Maternal Aunt 68  . Parkinson's disease Maternal Grandmother   . Rheumatic fever Maternal Grandfather      Prior to Admission medications   Medication Sig Start Date End Date Taking? Authorizing Provider  Azelaic Acid 15 % cream After skin is thoroughly washed and patted dry, gently but thoroughly massage a thin film of gel  into the affected area twice daily. 10/08/18   [provider]  BENICAR 20 MG tablet Take one tablet daily 12/19/18   [name], MD  Calcium Carbonate Antacid (TUMS CHEWY BITES PO) Take by mouth. After each meal    [provider]  clindamycin (CLEOCIN T) 1 % external solution as needed. 10/08/18   [provider]  clobetasol (TEMOVATE) 0.05 % external solution Apply topically. 10/08/18 10/08/19  [provider]  diphenoxylate-atropine (LOMOTIL) 2.5-0.025 MG tablet Take by mouth 2 (two) times daily as needed for diarrhea or loose stools.    [provider]  halobetasol (ULTRAVATE) 0.05 % cream Apply topically. 10/08/18   [provider]  hydroxychloroquine (PLAQUENIL) 200 MG tablet Take 1 tablet (200 mg total) by mouth daily. 01/03/19   Bo Merino, MD  lipase/protease/amylase (  CREON) 36000 UNITS CPEP capsule Take 1 capsule (36,000 Units total) by mouth 3 (three) times daily with meals. 08/29/18   Aline August, MD  nebivolol (BYSTOLIC) 2.5 MG tablet TAKE ONE (1) TABLET BY MOUTH EVERY DAY 12/19/18   Burns, Claudina Lick, MD  omeprazole (PRILOSEC) 20 MG capsule Take 20 mg by mouth daily.    [provider]  ondansetron (ZOFRAN) 4 MG tablet Take 1 tablet (4 mg total) by mouth every 8 (eight) hours as needed for nausea or vomiting. 09/05/18   Burns, Claudina Lick, MD  ONETOUCH ULTRA test strip USE TEST STRIPS TO CHECK BLOOD SUGAR AT LEAST 3 TIMES DAILY 11/28/18   [name], MD  predniSONE (DELTASONE) 5 MG tablet Take 2 tablets (10 mg total) by mouth daily with breakfast for 7 days, THEN 1.5 tablets (7.5 mg total) daily with breakfast for 7 days, THEN 1 tablet (5 mg total) daily with breakfast for 7 days, THEN 0.5 tablets (2.5 mg total) daily with breakfast for 7 days. 01/03/19 01/31/19  Bo Merino, MD  Rivaroxaban (XARELTO) 15 MG TABS tablet Take 1 tablet (15 mg total) by mouth daily with supper. Patient taking differently: Take 15 mg by  mouth daily.  09/07/18   Dorothy Spark, MD    Physical Exam: Vitals:   01/04/19 1625 01/04/19 2052  BP: 128/67 125/70  Pulse: 76 62  Resp: 16 14  Temp: 98.6 F (37 C)   TempSrc: Oral   SpO2: 97% 98%    Constitutional: NAD, calm, comfortable, non-toxic appearing elderly female Vitals:   01/04/19 1625 01/04/19 2052  BP: 128/67 125/70  Pulse: 76 62  Resp: 16 14  Temp: 98.6 F (37 C)   TempSrc: Oral   SpO2: 97% 98%   Eyes: PERRL, lids and conjunctivae normal ENMT: Mucous membranes are moist. Posterior pharynx clear of any exudate or lesions.Normal dentition.  Neck: normal, supple, no masses Respiratory: clear to auscultation bilaterally, no wheezing, no crackles. Normal respiratory effort. No accessory muscle use.  Cardiovascular: Regular rate and rhythm, no murmurs / rubs / gallops. No extremity edema. 2+ pedal pulses.  Abdomen: no tenderness, no masses palpated.  Hyperactive bowel sounds throughout.  musculoskeletal: no clubbing / cyanosis. No joint deformity upper and lower extremities. Good ROM, no contractures. Normal muscle tone.  Skin: no rashes, lesions, ulcers. No induration Neurologic: CN 2-12 grossly intact. Sensation intact. Strength 5/5 in all 4.  Psychiatric: Normal judgment and insight. Alert and oriented x 3. Normal mood.     Labs on Admission: I have personally reviewed following labs and imaging studies  CBC: Recent Labs  Lab 01/04/19 1644  WBC 7.9  HGB 11.0*  HCT 33.6*  MCV 96.0  PLT 409   Basic Metabolic Panel: Recent Labs  Lab 01/04/19 1644  NA 129*  K 3.5  CL 101  CO2 18*  GLUCOSE 95  BUN 15  CREATININE 1.55*  CALCIUM 8.9   GFR: Estimated Creatinine Clearance: 23.1 mL/min (A) (by C-G formula based on SCr of 1.55 mg/dL (H)). Liver Function Tests: Recent Labs  Lab 01/04/19 1644  AST 17  ALT 21  ALKPHOS 81  BILITOT 1.0  PROT 6.9  ALBUMIN 3.5   Recent Labs  Lab 01/04/19 1644  LIPASE 17   No results for input(s):  AMMONIA in the last 168 hours. Coagulation Profile: No results for input(s): INR, PROTIME in the last 168 hours. Cardiac Enzymes: No results for input(s): CKTOTAL, CKMB, CKMBINDEX, TROPONINI in the last 168 hours.  BNP (last 3 results) No results for input(s): PROBNP in the last 8760 hours. HbA1C: No results for input(s): HGBA1C in the last 72 hours. CBG: No results for input(s): GLUCAP in the last 168 hours. Lipid Profile: No results for input(s): CHOL, HDL, LDLCALC, TRIG, CHOLHDL, LDLDIRECT in the last 72 hours. Thyroid Function Tests: No results for input(s): TSH, T4TOTAL, FREET4, T3FREE, THYROIDAB in the last 72 hours. Anemia Panel: No results for input(s): VITAMINB12, FOLATE, FERRITIN, TIBC, IRON, RETICCTPCT in the last 72 hours. Urine analysis:    Component Value Date/Time   COLORURINE YELLOW 01/04/2019 [place] 01/04/2019 1740   LABSPEC 1.005 01/04/2019 1740   PHURINE 5.0 01/04/2019 1740   GLUCOSEU NEGATIVE 01/04/2019 1740   GLUCOSEU NEGATIVE 10/13/2009 0843   HGBUR SMALL (A) 01/04/2019 1740   BILIRUBINUR NEGATIVE 01/04/2019 1740   KETONESUR NEGATIVE 01/04/2019 1740   PROTEINUR NEGATIVE 01/04/2019 1740   UROBILINOGEN 0.2 10/13/2009 0843   NITRITE NEGATIVE 01/04/2019 1740   LEUKOCYTESUR NEGATIVE 01/04/2019 1740    Radiological Exams on Admission: No results found.    Assessment/Plan  Acute on chronic diarrhea - contine Lomotil and Creon - Has had extensive workup in June and July for the same with negative findings. Also just followed up with GI outpatient - GI consult in the morning - GI pathogen and C.difficle test pending   Acute kidney injury  - continue IV fluid hydration  - avoid nephrotoxic agent - follow BMP  Hyponatremia  - sodium of 129 on admission. Urine sodium shows hypovolemic hyponatremia which aligns with her clinical picture - repeat BMP following fluid hydration   Type 2 diabetes - sensitive SSI   Hx of PE  - On  Xarelto but will hold due to decrease creatinine clearance - start on heparin gtt  - Patient was on 15 mg on med list but will need to be resumed on 20mg  Xarelto when CrCl >30 for PE with lifelong anticoagulation.  Hypertension - continue Bsytolic - hold Benicar due to AKI   Systemic Lupus with inflammatory arthritis - stable - follows with Rheumatology outpatient    DVT prophylaxis:.Lovenox Code Status:Full  Family Communication: Plan discussed with patient at bedside  disposition Plan: Home with at least 2 midnight stays  Consults called:  Admission status: inpatient    Arieh Bogue T Krishawn Vanderweele DO [facility]   If 7PM-7AM, please contact night-coverage [email or web address] Password [facility]  01/04/2019, 9:21 PM

## 2019-01-04 NOTE — ED Provider Notes (Signed)
[facility] EMERGENCY DEPARTMENT Provider Note   CSN: [id number] Arrival date & time: 01/04/19  1615     History   Chief Complaint Chief Complaint  Patient presents with  . Diarrhea    HPI [name] is a 73 y.o. female history of lupus, on prednisone, presented to emergency department with diarrhea.  She reports she is been having ongoing issues with diarrhea for the past week.  She says that she is having 7-8 watery bowel movements a day.  She says her abdomen is gurgling.  She came to the ER today because she feels like she is getting weaker and unable to keep up with her fluids.  She is concerned because she has had a similar episode of severe diarrhea in July, which led to a hospitalization in acute kidney injury.  At that time, per her records, she had an infectious stool work-up which was negative.  She denies any known history of C. difficile.  She denies any recent antibiotics.  She says they are not sure why she was having so much diarrhea.  She was prescribed Creon and has been taking that.  She states is helping with her diarrhea until this week when it started again.  She reports that she has made very little urine today.  She feels that she is very dehydrated.  She denies any fevers or chills.  She denies vomiting.  She reports she feels generally weak.  She denies any blood in her stool.     HPI  Past Medical History:  Diagnosis Date  . ALLERGIC RHINITIS   . ANEMIA-NOS   . Arthritis   . ASTHMA   . Carpal tunnel syndrome   . COLONIC POLYPS, HX OF   . Coronary artery disease    "mild CAD" noted on 12/05/17 in coronary CT scan  . DIABETES MELLITUS, TYPE II    diet controlled  . GERD   . HYPERLIPIDEMIA   . HYPERTENSION   . OSTEOPENIA   . PONV (postoperative nausea and vomiting)   . Psoriasis    severe, began soriatane 01/2012  . Rectal fissure   . Scoliosis     Patient Active Problem List   Diagnosis Date Noted  . Inflammatory  arthritis 01/02/2019  . Positive ANA (antinuclear antibody) 01/02/2019  . Pancreatic insufficiency 10/23/2018  . Multiple pulmonary nodules determined by computed tomography of lung 09/18/2018  . Decreased GFR 08/21/2018  . Nausea vomiting and diarrhea 08/20/2018  . Syncope 08/01/2018  . Hypokalemia 08/01/2018  . Abnormal CT scan, pelvis 07/31/2018  . Pneumothorax 03/14/2018  . Lung abnormality 01/11/2018  . Pulmonary embolism ([place]) 12/24/2017  . Lung mass 12/24/2017  . Small vessel disease, cerebrovascular 07/31/2017  . Atypical chest pain 06/14/2017  . Dizziness 06/14/2017  . Hyperuricemia 05/03/2017  . Arthralgia 05/01/2017  . Hair loss 05/01/2017  . Vitamin D deficiency 10/31/2016  . Diarrhea 06/06/2016  . Diastolic dysfunction 14/97/0263  . Vertigo 12/24/2015  . Nonspecific abnormal electrocardiogram (ECG) (EKG) 12/24/2015  . Bilateral carotid artery disease, Mild 05/31/2015  . Thyroid nodule 05/31/2015  . Psoriasis   . Scoliosis   . Diabetes type 2, controlled ([place]) 06/03/2008  . Dyslipidemia 06/03/2008  . CARPAL TUNNEL SYNDROME 06/03/2008  . HTN (hypertension) 06/03/2008  . ALLERGIC RHINITIS 06/03/2008  . Asthma 06/03/2008  . GERD (gastroesophageal reflux disease) 06/03/2008  . Osteopenia 06/03/2008  . COLONIC POLYPS, HX OF 06/03/2008    Past Surgical History:  Procedure Laterality Date  .  benign rectal growth  2004   removed by Dr. Zella Richer  . BIOPSY  08/09/2018   Procedure: BIOPSY;  Surgeon: Carol Ada, MD;  Location: [facility] ENDOSCOPY;  Service: Endoscopy;;  . BIOPSY  08/22/2018   Procedure: BIOPSY;  Surgeon: Juanita Craver, MD;  Location: [facility] ENDOSCOPY;  Service: Endoscopy;;  . BREAST BIOPSY Right   . BREAST EXCISIONAL BIOPSY Left   . BREAST SURGERY  1988   biopsy  . CESAREAN SECTION    . COLONOSCOPY N/A 08/22/2018   Procedure: COLONOSCOPY;  Surgeon: Juanita Craver, MD;  Location: [facility] ENDOSCOPY;  Service: Endoscopy;  Laterality: N/A;  . ESOPHAGOGASTRODUODENOSCOPY  (EGD) WITH PROPOFOL N/A 08/04/2018   Procedure: ESOPHAGOGASTRODUODENOSCOPY (EGD) WITH PROPOFOL;  Surgeon: Ladene Artist, MD;  Location: [facility];  Service: Gastroenterology;  Laterality: N/A;  . FLEXIBLE SIGMOIDOSCOPY N/A 08/04/2018   Procedure: FLEXIBLE SIGMOIDOSCOPY;  Surgeon: Ladene Artist, MD;  Location: [facility];  Service: Gastroenterology;  Laterality: N/A;  . FLEXIBLE SIGMOIDOSCOPY N/A 08/09/2018   Procedure: FLEXIBLE SIGMOIDOSCOPY;  Surgeon: Carol Ada, MD;  Location: [facility];  Service: Endoscopy;  Laterality: N/A;  . VIDEO BRONCHOSCOPY WITH ENDOBRONCHIAL NAVIGATION N/A 03/14/2018   Procedure: VIDEO BRONCHOSCOPY WITH ENDOBRONCHIAL NAVIGATION;  Surgeon: Collene Gobble, MD;  Location: [facility] OR;  Service: Thoracic;  Laterality: N/A;     OB History   No obstetric history on file.      Home Medications    Prior to Admission medications   Medication Sig Start Date End Date Taking? Authorizing Provider  Azelaic Acid 15 % cream After skin is thoroughly washed and patted dry, gently but thoroughly massage a thin film of gel into the affected area twice daily. 10/08/18  Yes [provider]  BENICAR 20 MG tablet Take one tablet daily 12/19/18  Yes Burns, Claudina Lick, MD  Calcium Carbonate Antacid (TUMS CHEWY BITES PO) Take 1 tablet by mouth daily as needed (For heartburn). After each meal    Yes [provider]  clobetasol (TEMOVATE) 0.05 % external solution Apply 1 application topically every morning.  10/08/18 10/08/19 Yes [provider]  diphenoxylate-atropine (LOMOTIL) 2.5-0.025 MG tablet Take by mouth 2 (two) times daily as needed for diarrhea or loose stools.   Yes [provider]  halobetasol (ULTRAVATE) 0.05 % cream Apply topically. 10/08/18  Yes [provider]  lipase/protease/amylase (CREON) 36000 UNITS CPEP capsule Take 1 capsule (36,000 Units total) by mouth 3 (three) times daily with meals. 08/29/18  Yes Aline August, MD   nebivolol (BYSTOLIC) 2.5 MG tablet TAKE ONE (1) TABLET BY MOUTH EVERY DAY 12/19/18  Yes Burns, Claudina Lick, MD  omeprazole (PRILOSEC) 20 MG capsule Take 20 mg by mouth daily.   Yes [provider]  ondansetron (ZOFRAN) 4 MG tablet Take 1 tablet (4 mg total) by mouth every 8 (eight) hours as needed for nausea or vomiting. 09/05/18  Yes Burns, Claudina Lick, MD  ONETOUCH ULTRA test strip USE TEST STRIPS TO CHECK BLOOD SUGAR AT LEAST 3 TIMES DAILY 11/28/18  Yes [name], MD  Rivaroxaban (XARELTO) 15 MG TABS tablet Take 1 tablet (15 mg total) by mouth daily with supper. Patient taking differently: Take 15 mg by mouth daily.  09/07/18  Yes Dorothy Spark, MD    Family History Family History  Problem Relation Age of Onset  . Lung cancer Father   . Arthritis Other        Parents  . Asthma Other        parent,  other relative  . Breast cancer Other        other relative  . Hypertension Other        parent, other relative  . Heart disease Other        parent, other relative  . Heart disease Mother   . Asthma Mother   . Breast cancer Maternal Aunt 68  . Parkinson's disease Maternal Grandmother   . Rheumatic fever Maternal Grandfather     Social History Social History   Tobacco Use  . Smoking status: Former Smoker    Packs/day: 1.00    Years: 10.00    Pack years: 10.00    Quit date: 02/14/1974    Years since quitting: 44.9  . Smokeless tobacco: Never Used  Substance Use Topics  . Alcohol use: No  . Drug use: No     Allergies   Phenergan [promethazine hcl], Amlodipine, Clarithromycin, Codeine, Erythromycin, Statins, and Tetracycline   Review of Systems Review of Systems  Constitutional: Negative for chills and fever.  Cardiovascular: Negative for chest pain and palpitations.  Gastrointestinal: Positive for diarrhea and nausea. Negative for abdominal pain, blood in stool, constipation and vomiting.  Genitourinary: Positive for difficulty urinating. Negative for frequency  and hematuria.  Skin: Negative for pallor and rash.  Neurological: Negative for syncope and light-headedness.  All other systems reviewed and are negative.    Physical Exam Updated Vital Signs BP 125/70   Pulse 62   Temp 98.6 F (37 C) (Oral)   Resp 14   SpO2 98%   Physical Exam Vitals signs and nursing note reviewed.  Constitutional:      General: She is not in acute distress.    Appearance: She is well-developed.  HENT:     Head: Normocephalic and atraumatic.  Eyes:     Conjunctiva/sclera: Conjunctivae normal.  Neck:     Musculoskeletal: Neck supple.  Cardiovascular:     Rate and Rhythm: Normal rate and regular rhythm.     Pulses: Normal pulses.  Pulmonary:     Effort: Pulmonary effort is normal. No respiratory distress.     Breath sounds: Normal breath sounds.  Abdominal:     General: There is no distension.     Palpations: Abdomen is soft.     Tenderness: There is no abdominal tenderness.  Skin:    General: Skin is warm and dry.  Neurological:     Mental Status: She is alert.  Psychiatric:        Mood and Affect: Mood normal.        Behavior: Behavior normal.    ED Treatments / Results  Labs (all labs ordered are listed, but only abnormal results are displayed) Labs Reviewed  COMPREHENSIVE METABOLIC PANEL - Abnormal; Notable for the following components:      Result Value   Sodium 129 (*)    CO2 18 (*)    Creatinine, Ser 1.55 (*)    GFR calc non Af Amer 32 (*)    GFR calc Af Amer 38 (*)    All other components within normal limits  CBC - Abnormal; Notable for the following components:   RBC 3.50 (*)    Hemoglobin 11.0 (*)    HCT 33.6 (*)    All other components within normal limits  URINALYSIS, ROUTINE W REFLEX MICROSCOPIC - Abnormal; Notable for the following components:   Hgb urine dipstick SMALL (*)    All other components within normal limits  GASTROINTESTINAL PANEL BY PCR, STOOL (REPLACES STOOL  CULTURE)  C DIFFICILE QUICK SCREEN W PCR  REFLEX  SARS CORONAVIRUS 2 (TAT 6-24 HRS)  LIPASE, BLOOD  SODIUM, URINE, RANDOM  CREATININE, URINE, RANDOM  COMPREHENSIVE METABOLIC PANEL  APTT  HEPARIN LEVEL (UNFRACTIONATED)  HEPARIN LEVEL (UNFRACTIONATED)  CBC  APTT    EKG None  Radiology No results found.  Procedures Procedures (including critical care time)  Medications Ordered in ED Medications  0.9 %  sodium chloride infusion ( Intravenous New Bag/Given 01/04/19 2155)  insulin aspart (novoLOG) injection 0-9 Units (has no administration in time range)  heparin ADULT infusion 100 units/mL (25000 units/244mL sodium chloride 0.45%) (has no administration in time range)  diphenoxylate-atropine (LOMOTIL) 2.5-0.025 MG per tablet 1 tablet (1 tablet Oral Given 01/04/19 2045)  ondansetron (ZOFRAN) injection 4 mg (4 mg Intravenous Given 01/04/19 2041)  sodium chloride 0.9 % bolus 1,000 mL (0 mLs Intravenous Stopped 01/04/19 2129)     Initial Impression / Assessment and Plan / ED Course  I have reviewed the triage vital signs and the nursing notes.  Pertinent labs & imaging results that were available during my care of the patient were reviewed by me and considered in my medical decision making (see chart for details).  73 year old female with a history of lupus was on chronic prednisone, presenting to emergency department with severe diarrhea for a week.  She feels like she is becoming progressively fatigued and dehydrated.  She reports she had very little urine today.  She does have a worsening of her creatinine from her baseline (1.05 from 2 weeks ago), with no significant elevation in her BUN or specific gravity to indicate that she is severely dehydrated.  However in July when she had a similar episode of diarrhea her kidney function worsened to Cr 2.45 and GFR 17, which is concerning.  This is all in the setting of a variety of possible autoimmune diseases, which may be affecting her bowel movements.  The patient is unhappy  with her rheumatologist recommendation to start Plaquenil, as she believes may in fact worsen her diarrhea.  She was prescribed this medication recently but has not yet begun taking it.    Clinical Course as of Jan 03 2213  Fri Jan 04, 2019  1949 Leukocytes,Ua: NEGATIVE [MT]    Clinical Course User Index [MT] Wyvonnia Dusky, MD       Final Clinical Impressions(s) / ED Diagnoses   Final diagnoses:  Diarrhea, unspecified type  AKI (acute kidney injury) [facility])    ED Discharge Orders    None       Wyvonnia Dusky, MD 01/04/19 2214

## 2019-01-04 NOTE — Progress Notes (Signed)
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 Pharmacy consulted to dose heparin while Xarelto on hold for AKI (SCr 1.55 on admit, CrCl<30). Last dose of Xarelto 01/03/19 at 1900 per patient. Will start heparin now, as has been >24hrs since last dose of Xarelto. Hg 11, plt wnl. No active bleed issues documented. Heparin SQ ordered for VTE prophylaxis initially but no doses have been given.  Will use aPTT monitoring while Xarelto is expected to influence heparin levels.  Goal of Therapy:  Heparin level 0.3-0.7 units/ml aPTT 66-102 seconds Monitor platelets by anticoagulation protocol: Yes   Plan:  D/c heparin SQ order Baseline aPTT/heparin level No bolus with recent Xarelto. Start heparin at 900 units/hr 8h aPTT Monitor daily heparin level/aPTT/CBC, s/sx bleeding   Elicia Lamp, PharmD, BCPS Clinical Pharmacist 01/04/2019 9:49 PM

## 2019-01-05 LAB — GASTROINTESTINAL PANEL BY PCR, STOOL (REPLACES STOOL CULTURE)

## 2019-01-05 LAB — COMPREHENSIVE METABOLIC PANEL
ALT: 18 U/L (ref 0–44)
AST: 15 U/L (ref 15–41)
Albumin: 3.1 g/dL — ABNORMAL LOW (ref 3.5–5.0)
Alkaline Phosphatase: 71 U/L (ref 38–126)
Anion gap: 11 (ref 5–15)
BUN: 11 mg/dL (ref 8–23)
CO2: 14 mmol/L — ABNORMAL LOW (ref 22–32)
Calcium: 8.7 mg/dL — ABNORMAL LOW (ref 8.9–10.3)
Chloride: 112 mmol/L — ABNORMAL HIGH (ref 98–111)
Creatinine, Ser: 1.18 mg/dL — ABNORMAL HIGH (ref 0.44–1.00)
GFR calc Af Amer: 52 mL/min — ABNORMAL LOW (ref >60)
GFR calc non Af Amer: 45 mL/min — ABNORMAL LOW (ref >60)
Glucose, Bld: 85 mg/dL (ref 70–99)
Potassium: 4.3 mmol/L (ref 3.5–5.1)
Sodium: 137 mmol/L (ref 135–145)
Total Bilirubin: 0.9 mg/dL (ref 0.3–1.2)
Total Protein: 6.2 g/dL — ABNORMAL LOW (ref 6.5–8.1)

## 2019-01-05 LAB — GLUCOSE, CAPILLARY
Glucose-Capillary: 75 mg/dL (ref 70–99)
Glucose-Capillary: 84 mg/dL (ref 70–99)
Glucose-Capillary: 79 mg/dL (ref 70–99)
Glucose-Capillary: 87 mg/dL (ref 70–99)

## 2019-01-05 LAB — CBC
HCT: 31.9 % — ABNORMAL LOW (ref 36.0–46.0)
Hemoglobin: 10.6 g/dL — ABNORMAL LOW (ref 12.0–15.0)
MCH: 31.8 pg (ref 26.0–34.0)
MCHC: 33.2 g/dL (ref 30.0–36.0)
MCV: 95.8 fL (ref 80.0–100.0)
Platelets: 333 10*3/uL (ref 150–400)
RBC: 3.33 MIL/uL — ABNORMAL LOW (ref 3.87–5.11)
RDW: 13 % (ref 11.5–15.5)
WBC: 6.6 10*3/uL (ref 4.0–10.5)
nRBC: 0 % (ref 0.0–0.2)

## 2019-01-05 LAB — APTT: aPTT: 62 seconds — ABNORMAL HIGH (ref 24–36)

## 2019-01-05 LAB — HEPARIN LEVEL (UNFRACTIONATED): Heparin Unfractionated: 0.27 IU/mL — ABNORMAL LOW (ref 0.30–0.70)

## 2019-01-05 LAB — SARS CORONAVIRUS 2 (TAT 6-24 HRS): SARS Coronavirus 2: NEGATIVE

## 2019-01-05 MED ORDER — RIVAROXABAN 15 MG PO TABS
15.0000 mg | ORAL_TABLET | Freq: Every day | ORAL | Status: DC
  Administered 2019-01-05 – 2019-01-11 (×7): 15 mg via ORAL
  Filled 2019-01-05 (×7): qty 1

## 2019-01-05 MED ORDER — ENSURE ENLIVE PO LIQD: 237.0000 mL | Freq: Two times a day (BID) | ORAL | Status: DC

## 2019-01-05 MED ORDER — TRIAMCINOLONE ACETONIDE 0.5 % EX CREA
TOPICAL_CREAM | Freq: Two times a day (BID) | CUTANEOUS | Status: DC | PRN
  Administered 2019-01-05: 15:00:00 via TOPICAL
  Filled 2019-01-05: qty 15

## 2019-01-05 MED ORDER — BOOST / RESOURCE BREEZE PO LIQD CUSTOM
1.0000 | Freq: Three times a day (TID) | ORAL | Status: DC
  Administered 2019-01-05 – 2019-01-08 (×8): 1 via ORAL

## 2019-01-05 MED ORDER — ADULT MULTIVITAMIN W/MINERALS CH
1.0000 | ORAL_TABLET | Freq: Every day | ORAL | Status: DC
  Administered 2019-01-05 – 2019-01-12 (×8): 1 via ORAL
  Filled 2019-01-05 (×8): qty 1

## 2019-01-05 MED ORDER — ONDANSETRON HCL 4 MG/2ML IJ SOLN
4.0000 mg | Freq: Three times a day (TID) | INTRAMUSCULAR | Status: DC | PRN
  Administered 2019-01-05 – 2019-01-08 (×9): 4 mg via INTRAVENOUS
  Filled 2019-01-05 (×9): qty 2

## 2019-01-05 NOTE — ED Notes (Signed)
ED TO INPATIENT HANDOFF REPORT  ED Nurse Name and Phone #: Evalynn Hankins [phone]  S Name/Age/Gender [name] 73 y.o. female Room/Bed: 039C/039C  Code Status   Code Status: Full Code  Home/SNF/Other Home Patient oriented to: self, place and time Is this baseline? Yes   Triage Complete: Triage complete  Chief Complaint N/V/D  Triage Note Pt states she has had continued diarrhea and vomiting. Reports she is so exhausted and feels dehydrated.    Allergies Allergies  Allergen Reactions  . Phenergan [Promethazine Hcl] Shortness Of Breath and Anxiety  . Amlodipine Swelling  . Clarithromycin Nausea Only    I can pass out   . Codeine Nausea Only    I can pass out   . Erythromycin     Other reaction(s): Vomiting (intolerance)  . Statins     Muscle pain   . Tetracycline Other (See Comments)    unknown    Level of Care/Admitting Diagnosis ED Disposition    ED Disposition Condition Comment   Admit  Hospital Area: [facility] [[id number]]  Level of Care: Med-Surg [16]  Covid Evaluation: Asymptomatic Screening Protocol (No Symptoms)  Diagnosis: Diarrhea [787.91.ICD-9-CM]  Admitting Physician: Orene Desanctis [[id number]]  Attending Physician: Orene Desanctis [[id number]]  Estimated length of stay: past midnight tomorrow  Certification:: I certify this patient will need inpatient services for at least 2 midnights  PT Class (Do Not Modify): Inpatient [101]  PT Acc Code (Do Not Modify): Private [1]       B Medical/Surgery History Past Medical History:  Diagnosis Date  . ALLERGIC RHINITIS   . ANEMIA-NOS   . Arthritis   . ASTHMA   . Carpal tunnel syndrome   . COLONIC POLYPS, HX OF   . Coronary artery disease    "mild CAD" noted on 12/05/17 in coronary CT scan  . DIABETES MELLITUS, TYPE II    diet controlled  . GERD   . HYPERLIPIDEMIA   . HYPERTENSION   . OSTEOPENIA   . PONV (postoperative nausea and vomiting)   . Psoriasis    severe, began soriatane  01/2012  . Rectal fissure   . Scoliosis    Past Surgical History:  Procedure Laterality Date  . benign rectal growth  2004   removed by Dr. Zella Richer  . BIOPSY  08/09/2018   Procedure: BIOPSY;  Surgeon: Carol Ada, MD;  Location: [facility] ENDOSCOPY;  Service: Endoscopy;;  . BIOPSY  08/22/2018   Procedure: BIOPSY;  Surgeon: Juanita Craver, MD;  Location: [facility] ENDOSCOPY;  Service: Endoscopy;;  . BREAST BIOPSY Right   . BREAST EXCISIONAL BIOPSY Left   . BREAST SURGERY  1988   biopsy  . CESAREAN SECTION    . COLONOSCOPY N/A 08/22/2018   Procedure: COLONOSCOPY;  Surgeon: Juanita Craver, MD;  Location: [facility] ENDOSCOPY;  Service: Endoscopy;  Laterality: N/A;  . ESOPHAGOGASTRODUODENOSCOPY (EGD) WITH PROPOFOL N/A 08/04/2018   Procedure: ESOPHAGOGASTRODUODENOSCOPY (EGD) WITH PROPOFOL;  Surgeon: Ladene Artist, MD;  Location: [facility];  Service: Gastroenterology;  Laterality: N/A;  . FLEXIBLE SIGMOIDOSCOPY N/A 08/04/2018   Procedure: FLEXIBLE SIGMOIDOSCOPY;  Surgeon: Ladene Artist, MD;  Location: [place];  Service: Gastroenterology;  Laterality: N/A;  . FLEXIBLE SIGMOIDOSCOPY N/A 08/09/2018   Procedure: FLEXIBLE SIGMOIDOSCOPY;  Surgeon: Carol Ada, MD;  Location: [place];  Service: Endoscopy;  Laterality: N/A;  . VIDEO BRONCHOSCOPY WITH ENDOBRONCHIAL NAVIGATION N/A 03/14/2018   Procedure: VIDEO BRONCHOSCOPY WITH ENDOBRONCHIAL NAVIGATION;  Surgeon: Collene Gobble, MD;  Location: [place];  Service: Thoracic;  Laterality: N/A;     A IV Location/Drains/Wounds Patient Lines/Drains/Airways Status   Active Line/Drains/Airways    Name:   Placement date:   Placement time:   Site:   Days:   Peripheral IV 01/04/19 Left Forearm   01/04/19    2035    Forearm   1          Intake/Output Last 24 hours  Intake/Output Summary (Last 24 hours) at 01/05/2019 0050 Last data filed at 01/04/2019 2129 Gross per 24 hour  Intake 1000 ml  Output -  Net 1000 ml    Labs/Imaging Results for orders placed  or performed during the hospital encounter of 01/04/19 (from the past 48 hour(s))  Lipase, blood     Status: None   Collection Time: 01/04/19  4:44 PM  Result Value Ref Range   Lipase 17 11 - 51 U/L    Comment: Performed at [facility], [place] [address], [place], [place] [address]  Comprehensive metabolic panel     Status: Abnormal   Collection Time: 01/04/19  4:44 PM  Result Value Ref Range   Sodium 129 (L) 135 - 145 mmol/L   Potassium 3.5 3.5 - 5.1 mmol/L   Chloride 101 98 - 111 mmol/L   CO2 18 (L) 22 - 32 mmol/L   Glucose, Bld 95 70 - 99 mg/dL   BUN 15 8 - 23 mg/dL   Creatinine, Ser 1.55 (H) 0.44 - 1.00 mg/dL   Calcium 8.9 8.9 - 10.3 mg/dL   Total Protein 6.9 6.5 - 8.1 g/dL   Albumin 3.5 3.5 - 5.0 g/dL   AST 17 15 - 41 U/L   ALT 21 0 - 44 U/L   Alkaline Phosphatase 81 38 - 126 U/L   Total Bilirubin 1.0 0.3 - 1.2 mg/dL   GFR calc non Af Amer 32 (L) >60 mL/min   GFR calc Af Amer 38 (L) >60 mL/min   Anion gap 10 5 - 15    Comment: Performed at [facility], [place] [address], [place], [place] [address]  CBC     Status: Abnormal   Collection Time: 01/04/19  4:44 PM  Result Value Ref Range   WBC 7.9 4.0 - 10.5 K/uL   RBC 3.50 (L) 3.87 - 5.11 MIL/uL   Hemoglobin 11.0 (L) 12.0 - 15.0 g/dL   HCT 33.6 (L) 36.0 - 46.0 %   MCV 96.0 80.0 - 100.0 fL   MCH 31.4 26.0 - 34.0 pg   MCHC 32.7 30.0 - 36.0 g/dL   RDW 13.1 11.5 - 15.5 %   Platelets 344 150 - 400 K/uL   nRBC 0.0 0.0 - 0.2 %    Comment: Performed at [facility], [place] [address] [place], [place] [address]  Urinalysis, Routine w reflex microscopic     Status: Abnormal   Collection Time: 01/04/19  5:40 PM  Result Value Ref Range   Color, Urine YELLOW YELLOW   APPearance CLEAR CLEAR   Specific Gravity, Urine 1.005 1.005 - 1.030   pH 5.0 5.0 - 8.0   Glucose, UA NEGATIVE NEGATIVE mg/dL   Hgb urine dipstick SMALL (A) NEGATIVE   Bilirubin Urine NEGATIVE NEGATIVE   Ketones, ur NEGATIVE NEGATIVE mg/dL   Protein,  ur NEGATIVE NEGATIVE mg/dL   Nitrite NEGATIVE NEGATIVE   Leukocytes,Ua NEGATIVE NEGATIVE   RBC / HPF 0-5 0 - 5 RBC/hpf   WBC, UA 0-5 0 - 5 WBC/hpf   Bacteria,  UA NONE SEEN NONE SEEN   Squamous Epithelial / LPF 0-5 0 - 5   Mucus PRESENT     Comment: Performed at [facility], [place] [address] [place], [place] [address]  Sodium, urine, random     Status: None   Collection Time: 01/04/19  8:00 PM  Result Value Ref Range   Sodium, Ur <10 mmol/L    Comment: REPEATED TO VERIFY Performed at [facility], [place] [address] [place], [place] [address]   Creatinine, urine, random     Status: None   Collection Time: 01/04/19  8:00 PM  Result Value Ref Range   Creatinine, Urine 66.67 mg/dL    Comment: Performed at [place] [address], [place], [place] [address]  C Difficile Quick Screen w PCR reflex     Status: None   Collection Time: 01/04/19  8:20 PM   Specimen: Stool  Result Value Ref Range   C Diff antigen NEGATIVE NEGATIVE   C Diff toxin NEGATIVE NEGATIVE   C Diff interpretation No C. difficile detected.     Comment: Performed at [facility], [place] [address], [place], [place] [address]  APTT     Status: Abnormal   Collection Time: 01/04/19 10:20 PM  Result Value Ref Range   aPTT 40 (H) 24 - 36 seconds    Comment:        IF BASELINE aPTT IS ELEVATED, SUGGEST PATIENT RISK ASSESSMENT BE USED TO DETERMINE APPROPRIATE ANTICOAGULANT THERAPY. Performed at [facility], [place] [address] [place], [place] [address]   Heparin level (unfractionated)     Status: None   Collection Time: 01/04/19 10:20 PM  Result Value Ref Range   Heparin Unfractionated 0.30 0.30 - 0.70 IU/mL    Comment: (NOTE) If heparin results are below expected values, and patient dosage has  been confirmed, suggest follow up testing of antithrombin III levels. Performed at [facility], [place] [address] [place], [place] [address]   CBG monitoring, ED     Status: None    Collection Time: 01/04/19 10:22 PM  Result Value Ref Range   Glucose-Capillary 81 70 - 99 mg/dL   No results found.  Pending Labs Unresulted Labs (From admission, onward)    Start     Ordered   01/06/19 0500  APTT  Daily,   R     01/04/19 2159   01/05/19 0700  APTT  Once-Timed,   STAT     01/04/19 2212   01/05/19 0500  Comprehensive metabolic panel  Tomorrow morning,   R     01/04/19 2121   01/05/19 0500  Heparin level (unfractionated)  Daily,   R     01/04/19 2159   01/05/19 0500  CBC  Daily,   R     01/04/19 2159   01/04/19 2014  SARS CORONAVIRUS 2 (TAT 6-24 HRS) Nasopharyngeal Nasopharyngeal Swab  (Asymptomatic/Tier 3)  Once,   STAT    Question Answer Comment  Is this test for diagnosis or screening Screening   Symptomatic for COVID-19 as defined by CDC No   Hospitalized for COVID-19 No   Admitted to ICU for COVID-19 No   Previously tested for COVID-19 No   Resident in a congregate (group) care setting No   Employed in healthcare setting No   Pregnant No      01/04/19 2013   01/04/19 2001  Gastrointestinal Panel by PCR , Stool  (Gastrointestinal Panel  by PCR, Stool                                                                                                                                                     *Does Not include CLOSTRIDIUM DIFFICILE testing.**If CDIFF testing is needed, select the C Difficile Quick Screen w PCR reflex order below)  Once,   STAT     01/04/19 2000          Vitals/Pain Today's Vitals   01/04/19 1625 01/04/19 1631 01/04/19 2052 01/04/19 2340  BP: 128/67  125/70 122/76  Pulse: 76  62 65  Resp: 16  14 14   Temp: 98.6 F (37 C)     TempSrc: Oral     SpO2: 97%  98% 97%  PainSc:  5       Isolation Precautions No active isolations  Medications Medications  0.9 %  sodium chloride infusion ( Intravenous New Bag/Given 01/04/19 2155)  insulin aspart (novoLOG) injection 0-9 Units (has no administration in time range)  heparin ADULT  infusion 100 units/mL (25000 units/249mL sodium chloride 0.45%) (900 Units/hr Intravenous New Bag/Given 01/04/19 2328)  nebivolol (BYSTOLIC) tablet 2.5 mg (has no administration in time range)  diphenoxylate-atropine (LOMOTIL) 2.5-0.025 MG per tablet 1 tablet (1 tablet Oral Given 01/04/19 2359)  lipase/protease/amylase (CREON) capsule 36,000 Units (has no administration in time range)  pantoprazole (PROTONIX) EC tablet 40 mg (has no administration in time range)  diphenoxylate-atropine (LOMOTIL) 2.5-0.025 MG per tablet 1 tablet (1 tablet Oral Given 01/04/19 2045)  ondansetron (ZOFRAN) injection 4 mg (4 mg Intravenous Given 01/04/19 2041)  sodium chloride 0.9 % bolus 1,000 mL (0 mLs Intravenous Stopped 01/04/19 2129)    Mobility walks Low fall risk     R Recommendations: See Admitting Provider Note  Report given to:   Additional Notes:

## 2019-01-05 NOTE — Discharge Instructions (Signed)
Information on my medicine - XARELTO (Rivaroxaban)  This medication education was reviewed with me or my healthcare representative as part of my discharge preparation.   Why was Xarelto prescribed for you? Xarelto was prescribed for you to reduce the risk of blood clots forming. The medical term for these abnormal blood clots is venous thromboembolism (VTE).  What do you need to know about xarelto ? Take your Xarelto ONCE DAILY at the same time every day. You may take it either with or without food.  If you have difficulty swallowing the tablet whole, you may crush it and mix in applesauce just prior to taking your dose.  Take Xarelto exactly as prescribed by your doctor and DO NOT stop taking Xarelto without talking to the doctor who prescribed the medication.  Stopping without other VTE prevention medication to take the place of Xarelto may increase your risk of developing a clot.  After discharge, you should have regular check-up appointments with your healthcare provider that is prescribing your Xarelto.    What do you do if you miss a dose? If you miss a dose, take it as soon as you remember on the same day then continue your regularly scheduled once daily regimen the next day. Do not take two doses of Xarelto on the same day.   Important Safety Information A possible side effect of Xarelto is bleeding. You should call your healthcare provider right away if you experience any of the following: ? Bleeding from an injury or your nose that does not stop. ? Unusual colored urine (red or dark brown) or unusual colored stools (red or black). ? Unusual bruising for unknown reasons. ? A serious fall or if you hit your head (even if there is no bleeding).  Some medicines may interact with Xarelto and might increase your risk of bleeding while on Xarelto. To help avoid this, consult your healthcare provider or pharmacist prior to using any new prescription or non-prescription  medications, including herbals, vitamins, non-steroidal anti-inflammatory drugs (NSAIDs) and supplements.  This website has more information on Xarelto: [email or web address]

## 2019-01-05 NOTE — Progress Notes (Addendum)
Brief Narrative:  73 y.o. female with medical history significant of pulmonary embolism on Xarelto, bilateral carotid stenosis, systemic lupus, asthma, pulmonary nodules, pancreatic insufficiency in chronic diarrhea who presents with concerns of persistent diarrhea.  She recently saw her GI this week for this problem and was started on probiotic but discontinued it after two doses since it made her vomit.  She then saw them a second time yesterday and had stool studies done that are still pending. 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 She believes the diarrhea started at the same time she started taking her prednisone. Patient's overall issue with diarrhea started back in June and she was hospitalized in June and July for the same and also had acute renal failure for dehydration.  She had a largely extensive work-up including C. Difficile, GI pathogen panel, upper and lower endoscopy, CT abd/pelvis that was negative at that time.  Also underwent colonoscopy on 08/22/2018 with negative biopsy. She has since followed with outpatient GI Dr. Collene Mares and was started on Creon in July and had some improvement to her symptoms.   Subjective: Patient has had 2 episodes of watery diarrhea this morning; one at around 4 AM and another one around 6 AM.  Patient says it is watery but there is no blood present.  Patient denies any abdominal pain, shortness of breath, chest pain, palpitation.  She says her facial redness is coming back for which she used some steroid topical cream in the past at home.  Patient also gets mildly nauseated and asked for Zofran.  Objective: Vital signs in last 24 hours: Temp:  [98.1 F (36.7 C)-98.6 F (37 C)] 98.1 F (36.7 C) (11/21 0444) Pulse Rate:  [62-77] 77 (11/21 0444) Resp:  [14-18] 18 (11/21 0444) BP: (122-130)/(56-76) 127/56 (11/21 0444) SpO2:  [97  %-100 %] 99 % (11/21 0444) Weight:  [55.1 kg] 55.1 kg (11/21 0200)  Intake/Output from previous day: 11/20 0701 - 11/21 0700 In: 1902.8 [P.O.:100; I.V.:802.8] Out: -  Intake/Output this shift: Total I/O In: 120 [P.O.:120] Out: -   Eyes: PERRL, lids and conjunctivae normal ENMT: Mucous membranes are moist. Posterior pharynx clear of any exudate or lesions.Normal dentition.  Neck: normal, supple, no masses Respiratory: clear to auscultation bilaterally, no wheezing, no crackles. Normal respiratory effort. No accessory muscle use.  Cardiovascular: Regular rate and rhythm, no murmurs / rubs / gallops. No extremity edema. 2+ pedal pulses.  Abdomen: no tenderness, no masses palpated.  Hyperactive bowel sounds throughout.  musculoskeletal: no clubbing / cyanosis. No joint deformity upper and lower extremities. Good ROM, no contractures. Normal muscle tone.  Skin: no rashes, lesions, ulcers. No induration Neurologic: CN 2-12 grossly intact. Sensation intact. Strength 5/5 in all 4.  Psychiatric: Normal judgment and insight. Alert and oriented x 3. Slightly anxious.    Results for orders placed or performed during the hospital encounter of 01/04/19 (from the past 24 hour(s))  Lipase, blood     Status: None   Collection Time: 01/04/19  4:44 PM  Result Value Ref Range   Lipase 17 11 - 51 U/L  Comprehensive metabolic panel     Status: Abnormal   Collection Time: 01/04/19  4:44 PM  Result Value Ref Range   Sodium 129 (L) 135 - 145 mmol/L   Potassium 3.5 3.5 - 5.1 mmol/L   Chloride 101 98 - 111 mmol/L  CO2 18 (L) 22 - 32 mmol/L   Glucose, Bld 95 70 - 99 mg/dL   BUN 15 8 - 23 mg/dL   Creatinine, Ser 1.55 (H) 0.44 - 1.00 mg/dL   Calcium 8.9 8.9 - 10.3 mg/dL   Total Protein 6.9 6.5 - 8.1 g/dL   Albumin 3.5 3.5 - 5.0 g/dL   AST 17 15 - 41 U/L   ALT 21 0 - 44 U/L   Alkaline Phosphatase 81 38 - 126 U/L   Total Bilirubin 1.0 0.3 - 1.2 mg/dL   GFR calc non Af Amer 32 (L) >60 mL/min   GFR calc  Af Amer 38 (L) >60 mL/min   Anion gap 10 5 - 15  CBC     Status: Abnormal   Collection Time: 01/04/19  4:44 PM  Result Value Ref Range   WBC 7.9 4.0 - 10.5 K/uL   RBC 3.50 (L) 3.87 - 5.11 MIL/uL   Hemoglobin 11.0 (L) 12.0 - 15.0 g/dL   HCT 33.6 (L) 36.0 - 46.0 %   MCV 96.0 80.0 - 100.0 fL   MCH 31.4 26.0 - 34.0 pg   MCHC 32.7 30.0 - 36.0 g/dL   RDW 13.1 11.5 - 15.5 %   Platelets 344 150 - 400 K/uL   nRBC 0.0 0.0 - 0.2 %  Urinalysis, Routine w reflex microscopic     Status: Abnormal   Collection Time: 01/04/19  5:40 PM  Result Value Ref Range   Color, Urine YELLOW YELLOW   APPearance CLEAR CLEAR   Specific Gravity, Urine 1.005 1.005 - 1.030   pH 5.0 5.0 - 8.0   Glucose, UA NEGATIVE NEGATIVE mg/dL   Hgb urine dipstick SMALL (A) NEGATIVE   Bilirubin Urine NEGATIVE NEGATIVE   Ketones, ur NEGATIVE NEGATIVE mg/dL   Protein, ur NEGATIVE NEGATIVE mg/dL   Nitrite NEGATIVE NEGATIVE   Leukocytes,Ua NEGATIVE NEGATIVE   RBC / HPF 0-5 0 - 5 RBC/hpf   WBC, UA 0-5 0 - 5 WBC/hpf   Bacteria, UA NONE SEEN NONE SEEN   Squamous Epithelial / LPF 0-5 0 - 5   Mucus PRESENT   Sodium, urine, random     Status: None   Collection Time: 01/04/19  8:00 PM  Result Value Ref Range   Sodium, Ur <10 mmol/L  Creatinine, urine, random     Status: None   Collection Time: 01/04/19  8:00 PM  Result Value Ref Range   Creatinine, Urine 66.67 mg/dL  C Difficile Quick Screen w PCR reflex     Status: None   Collection Time: 01/04/19  8:20 PM   Specimen: Stool  Result Value Ref Range   C Diff antigen NEGATIVE NEGATIVE   C Diff toxin NEGATIVE NEGATIVE   C Diff interpretation No C. difficile detected.   SARS CORONAVIRUS 2 (TAT 6-24 HRS) Nasopharyngeal Nasopharyngeal Swab     Status: None   Collection Time: 01/04/19  8:46 PM   Specimen: Nasopharyngeal Swab  Result Value Ref Range   SARS Coronavirus 2 NEGATIVE NEGATIVE  APTT     Status: Abnormal   Collection Time: 01/04/19 10:20 PM  Result Value Ref Range    aPTT 40 (H) 24 - 36 seconds  Heparin level (unfractionated)     Status: None   Collection Time: 01/04/19 10:20 PM  Result Value Ref Range   Heparin Unfractionated 0.30 0.30 - 0.70 IU/mL  CBG monitoring, ED     Status: None   Collection Time: 01/04/19 10:22 PM  Result  Value Ref Range   Glucose-Capillary 81 70 - 99 mg/dL  Comprehensive metabolic panel     Status: Abnormal   Collection Time: 01/05/19  7:22 AM  Result Value Ref Range   Sodium 137 135 - 145 mmol/L   Potassium 4.3 3.5 - 5.1 mmol/L   Chloride 112 (H) 98 - 111 mmol/L   CO2 14 (L) 22 - 32 mmol/L   Glucose, Bld 85 70 - 99 mg/dL   BUN 11 8 - 23 mg/dL   Creatinine, Ser 1.18 (H) 0.44 - 1.00 mg/dL   Calcium 8.7 (L) 8.9 - 10.3 mg/dL   Total Protein 6.2 (L) 6.5 - 8.1 g/dL   Albumin 3.1 (L) 3.5 - 5.0 g/dL   AST 15 15 - 41 U/L   ALT 18 0 - 44 U/L   Alkaline Phosphatase 71 38 - 126 U/L   Total Bilirubin 0.9 0.3 - 1.2 mg/dL   GFR calc non Af Amer 45 (L) >60 mL/min   GFR calc Af Amer 52 (L) >60 mL/min   Anion gap 11 5 - 15  CBC     Status: Abnormal   Collection Time: 01/05/19  7:22 AM  Result Value Ref Range   WBC 6.6 4.0 - 10.5 K/uL   RBC 3.33 (L) 3.87 - 5.11 MIL/uL   Hemoglobin 10.6 (L) 12.0 - 15.0 g/dL   HCT 31.9 (L) 36.0 - 46.0 %   MCV 95.8 80.0 - 100.0 fL   MCH 31.8 26.0 - 34.0 pg   MCHC 33.2 30.0 - 36.0 g/dL   RDW 13.0 11.5 - 15.5 %   Platelets 333 150 - 400 K/uL   nRBC 0.0 0.0 - 0.2 %  APTT     Status: Abnormal   Collection Time: 01/05/19  7:22 AM  Result Value Ref Range   aPTT 62 (H) 24 - 36 seconds  Heparin level (unfractionated)     Status: Abnormal   Collection Time: 01/05/19  7:22 AM  Result Value Ref Range   Heparin Unfractionated 0.27 (L) 0.30 - 0.70 IU/mL  Glucose, capillary     Status: None   Collection Time: 01/05/19  7:45 AM  Result Value Ref Range   Glucose-Capillary 75 70 - 99 mg/dL  Glucose, capillary     Status: None   Collection Time: 01/05/19 11:39 AM  Result Value Ref Range    Glucose-Capillary 84 70 - 99 mg/dL    Studies/Results: Xr Hand 2 View Left  Result Date: 12/24/2018 No MCP PIP or DIP narrowing was noted.  No intercarpal radiocarpal joint space narrowing was noted.  No erosive changes were noted.  Generalized osteopenia was noted. Impression: Unremarkable x-ray of the hand.  Xr Hand 2 View Right  Result Date: 12/24/2018 No MCP PIP or DIP narrowing was noted.  No intercarpal radiocarpal joint space narrowing was noted.  No erosive changes were noted.  Generalized osteopenia was noted. Impression: Unremarkable x-ray of the hand.  Xr Pelvis 1-2 Views  Result Date: 12/24/2018 No SI joint narrowing or sclerosis was noted.  Severe lumbar spine scoliosis was noted. Impression: Unremarkable x-ray of the SI joints.   Scheduled Meds: . diphenoxylate-atropine  1 tablet Oral TID AC & HS  . insulin aspart  0-9 Units Subcutaneous TID WC  . lipase/protease/amylase  36,000 Units Oral TID WC  . nebivolol  2.5 mg Oral Daily   Continuous Infusions: . sodium chloride 100 mL/hr at 01/05/19 0719  . heparin 1,000 Units/hr (01/05/19 0938)   PRN Meds:ondansetron (ZOFRAN)  IV, triamcinolone cream  Assessment/Plan:  Acute on chronic diarrhea: Did not resolve yet  - Contine Lomotil and Creon - Has had extensive workup in June and July for the same with negative findings. Also just followed up with GI outpatient - GI consulted; spoke with the PA on-call and would see the patient; appreciate recs  - GI pathogen --> Pending and C.difficle test negative -We will also discontinue any PPI since in the long run it may cause diarrhea as well.  Unless patient has excessive GERD which she does not at the moment we at that time may restart PPI. Also will avoid any kind of lactose-containing food or dairy products at this time. Continue current diet.  Acute kidney injury  -Improving to 1.18 from yesterday -Continue IV fluid hydration  - avoid nephrotoxic agent - follow  BMP  Hyponatremia --resolved -137 today - Sodium of 129 on admission. Urine sodium shows hypovolemic hyponatremia which aligns with her clinical picture -May repeat BMP following fluid hydration   Type 2 diabetes -Blood glucose controlled  Continue sensitive SSI   Hx of PE; no acute issue at this moment - On Xarelto 15 mg daily per patient and her medical record.  It appears that her Xarelto dose has recently changed down to 50 mg from 20 mg.  Was on hold due to decrease creatinine clearance on admission and following day.  As patient's creatinine and GFR improved Xarelto home dose resumed.  Advised to follow-up with her PCP are however started her on this dose to reconsider changing the dose to 20 mg.  Follow-up outpatient -Patient thinks she was developing some rash that might be due to heparin.  Discontinued heparin as Xarelto resumed per pharmacy.  Hypertension -Stable -Continue Bsytolic home dose -Continue to hold Benicar due to AKI.  Explained to patient.  Systemic Lupus with inflammatory arthritis - stable - follows with Rheumatology outpatient  Facial rash: Patient has had it over the past years which fluctuates In the morning she had some reddish nevus on her face which she thinks is worse than her usual state.  She says she use some steroid cream. Mary start her on triamcinolone cream twice daily as needed Monitor closely  DVT prophylaxis: On xarelto  Debility: As her diarrhea improves may start getting some physical therapy.  Code Status:Full    LOS: 1 day   [name] [name]

## 2019-01-05 NOTE — Progress Notes (Addendum)
[facility] for heparin Indication: history of PE on Xarelto PTA Heparin Dosing Weight: 54.6 kg  Labs: Recent Labs    01/04/19 1644 01/04/19 2220 01/05/19 0722  HGB 11.0*  --  10.6*  HCT 33.6*  --  31.9*  PLT 344  --  333  APTT  --  40* 62*  HEPARINUNFRC  --  0.30 0.27*  CREATININE 1.55*  --  1.18*    Assessment: 63 yof with hx of PE on lifelong Xarelto PTA presenting with persistent diarrhea. Pharmacy consulted to dose heparin while Xarelto on hold for AKI (SCr 1.55 on admit, CrCl<30). Last dose of Xarelto 01/03/19 at 1900 per patient.   Today, aPTT slightly subtherapeutic at 62. CBC lower than normal but stable and no bleeding reported. No issues with the infusion per RN and patient's SCr trending down. Will use aPTT monitoring while Xarelto is expected to influence heparin levels.  Goal of Therapy:  Heparin level 0.3-0.7 units/ml aPTT 66-102 seconds Monitor platelets by anticoagulation protocol: Yes   Plan:  Increase heparin to 1000 units/hr 6h aPTT Monitor daily heparin level/aPTT/CBC, s/sx bleeding F/u restart of oral anticoagulant at appropriate dose for h/o PE  **Addendum** Pharmacy consulted to restart patient's home anticoagulant, rivaroxaban. 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 Hesitant to change home dose (rivaroxaban 15 mg daily) given complexity of patient's clinical picture and outpatient provider's recommendation. Pharmacy will recommend to follow up with outpatient provider to assess appropriateness of rivaroxaban 15 mg daily in this patient.  Plan: - d/c heparin infusion - start rivaroxaban 15mg  daily - monitor for s/sx bleeding   Brendolyn Patty, PharmD PGY2 Pharmacy Resident Phone [phone]  01/05/2019   9:18 AM

## 2019-01-05 NOTE — Plan of Care (Signed)
  Problem: Education: Goal: Knowledge of General Education information will improve Description: Including pain rating scale, medication(s)/side effects and non-pharmacologic comfort measures Outcome: Progressing   Problem: Activity: Goal: Risk for activity intolerance will decrease Outcome: Progressing   

## 2019-01-05 NOTE — Progress Notes (Signed)
Initial Nutrition Assessment  DOCUMENTATION CODES:   Not applicable  INTERVENTION:   Boost Breeze po TID, each supplement provides 250 kcal and 9 grams of protein  MVI daily   Recommend increasing creon dose at meals   NUTRITION DIAGNOSIS:   Inadequate oral intake related to acute illness as evidenced by per patient/family report.  GOAL:   Patient will meet greater than or equal to 90% of their needs  MONITOR:   PO intake, Supplement acceptance, Labs, Weight trends, Skin, I & O's  REASON FOR ASSESSMENT:   Malnutrition Screening Tool    ASSESSMENT:   HPI: [name] is a 73 y.o. female with medical history significant of pulmonary embolism on Xarelto, bilateral carotid stenosis, systemic lupus, asthma, pulmonary nodules, pancreatic insufficiency in chronic diarrhea who presents with concerns of persistent diarrhea.  RD working remotely.  Spoke with pt via phone. Pt reports poor appetite and oral intake pta and in hospital r/t nausea and widespread pain from her lupus. Pt is unhappy about being on a full liquid diet. Pt believes that the liquid diet is making her more nauseas. Pt is also upset as she is not being given Prilosec which she takes daily at home. Pt reports chronic diarrhea that started in June of this year. Pt reports that she has not had any GI surgeries and she believes the diarrhea started around the time she started taking prednisone. Pt s/p GI workup and was started on creon which she believes has helped some.Pt reports taking 36,000 units TID with meals; this dose can be increased as this is a beginning recommended dose. Pt is reluctant to drink Ensure as MD advised her to avoid dairy products; RD explained that Ensure is lactose free but pt would prefer to try peach or mixed berry Boost Breeze. Pt does report taking a daily MVI at home.    Per chart, pt with 8lb(7%) weight loss; this is not significant.      Medications reviewed and include:  lomotil, insulin, creon, NaCl @100ml /hr, heparin, zofran  Labs reviewed: creat 1.18(H) Hgb 10.6(L), Hct 31.9(L)  Unable to complete Nutrition-Focused physical exam at this time.   Diet Order:   Diet Order            Diet full liquid Room service appropriate? Yes; Fluid consistency: Thin  Diet effective now             EDUCATION NEEDS:   No education needs have been identified at this time  Skin:  Skin Assessment: Reviewed RN Assessment  Last BM:  11/21- diarrhea  Height:   Ht Readings from Last 1 Encounters:  01/05/19 5' 0.5" (1.537 m)    Weight:   Wt Readings from Last 1 Encounters:  01/05/19 55.1 kg    Ideal Body Weight:  45.4 kg  BMI:  Body mass index is 23.33 kg/m.  Estimated Nutritional Needs:   Kcal:  1300-1500kcal/day  Protein:  65-75g/day  Fluid:  >1.2L/day  Koleen Distance MS, RD, LDN Pager #- [phone] Office#- [phone] After Hours Pager: [phone]

## 2019-01-06 DIAGNOSIS — R197 Diarrhea, unspecified: Secondary | ICD-10-CM

## 2019-01-06 DIAGNOSIS — K8681 Exocrine pancreatic insufficiency: Principal | ICD-10-CM

## 2019-01-06 LAB — GLUCOSE, CAPILLARY
Glucose-Capillary: 80 mg/dL (ref 70–99)
Glucose-Capillary: 90 mg/dL (ref 70–99)
Glucose-Capillary: 94 mg/dL (ref 70–99)
Glucose-Capillary: 102 mg/dL — ABNORMAL HIGH (ref 70–99)
Glucose-Capillary: 103 mg/dL — ABNORMAL HIGH (ref 70–99)

## 2019-01-06 LAB — BASIC METABOLIC PANEL
Anion gap: 6 (ref 5–15)
BUN: 5 mg/dL — ABNORMAL LOW (ref 8–23)
CO2: 16 mmol/L — ABNORMAL LOW (ref 22–32)
Calcium: 9.4 mg/dL (ref 8.9–10.3)
Chloride: 119 mmol/L — ABNORMAL HIGH (ref 98–111)
Creatinine, Ser: 1.03 mg/dL — ABNORMAL HIGH (ref 0.44–1.00)
GFR calc Af Amer: >60 mL/min (ref >60)
GFR calc non Af Amer: 53 mL/min — ABNORMAL LOW (ref >60)
Glucose, Bld: 104 mg/dL — ABNORMAL HIGH (ref 70–99)
Potassium: 3.4 mmol/L — ABNORMAL LOW (ref 3.5–5.1)
Sodium: 141 mmol/L (ref 135–145)

## 2019-01-06 LAB — CBC
HCT: 30.4 % — ABNORMAL LOW (ref 36.0–46.0)
Hemoglobin: 10.1 g/dL — ABNORMAL LOW (ref 12.0–15.0)
MCH: 31.6 pg (ref 26.0–34.0)
MCHC: 33.2 g/dL (ref 30.0–36.0)
MCV: 95 fL (ref 80.0–100.0)
Platelets: 301 10*3/uL (ref 150–400)
RBC: 3.2 MIL/uL — ABNORMAL LOW (ref 3.87–5.11)
RDW: 13.2 % (ref 11.5–15.5)
WBC: 7 10*3/uL (ref 4.0–10.5)
nRBC: 0 % (ref 0.0–0.2)

## 2019-01-06 MED ORDER — OMEPRAZOLE 20 MG PO CPDR
20.0000 mg | DELAYED_RELEASE_CAPSULE | Freq: Every day | ORAL | Status: DC
  Administered 2019-01-06 – 2019-01-12 (×7): 20 mg via ORAL
  Filled 2019-01-06 (×10): qty 1

## 2019-01-06 MED ORDER — PANCRELIPASE (LIP-PROT-AMYL) 12000-38000 UNITS PO CPEP
72000.0000 [IU] | ORAL_CAPSULE | Freq: Three times a day (TID) | ORAL | Status: DC
  Administered 2019-01-06 – 2019-01-12 (×18): 72000 [IU] via ORAL
  Filled 2019-01-06 (×19): qty 6

## 2019-01-06 MED ORDER — DIPHENOXYLATE-ATROPINE 2.5-0.025 MG PO TABS
2.0000 | ORAL_TABLET | Freq: Four times a day (QID) | ORAL | Status: DC | PRN
  Administered 2019-01-06 – 2019-01-08 (×6): 2 via ORAL
  Filled 2019-01-06 (×6): qty 2

## 2019-01-06 MED ORDER — OMEPRAZOLE 2 MG/ML ORAL SUSPENSION: 20.0000 mg | Freq: Every day | ORAL | Status: DC

## 2019-01-06 NOTE — Progress Notes (Addendum)
Pt very concerned about her worsening condition, per pt. Pt would want to see a GI doctor anytime soon. Pt prefers Dr Verdia Kuba or Dr Simonne Come.

## 2019-01-06 NOTE — Evaluation (Signed)
Physical Therapy Evaluation Patient Details Name: ROCHELL PUETT MRN: [id number] DOB: 10/18/1945 Today's Date: 01/06/2019   History of Present Illness  73 y.o. female with medical history significant of pulmonary embolism on Xarelto, bilateral carotid stenosis, systemic lupus, asthma, pulmonary nodules, pancreatic insufficiency in chronic diarrhea who presents with concerns of persistent diarrhea. Pt with significant pain from Lupus  Clinical Impression  Pt demonstrates significant pain in BUE and neck 2/2 lupus. Pain limits pt's tolerance for mobility at this time as well, as she has difficulty managing IV pole in room and is fearful of deconditioning. Pt is slow and cautious during limited OOB activity this session, declining further ambulation 2/2 GERD. Pt will benefit from continued acute PT to progress UE therapeutic exercise program and continue gait, balance, and strength training, to aide in a return toward the pt's baseline.    Follow Up Recommendations No PT follow up    Equipment Recommendations  None recommended by PT    Recommendations for Other Services       Precautions / Restrictions Precautions Precautions: Fall Restrictions Weight Bearing Restrictions: No      Mobility  Bed Mobility Overal bed mobility: (pt up ambulating in room upon arrival and left sitting EOB)                Transfers Overall transfer level: Needs assistance Equipment used: None Transfers: Sit to/from Stand Sit to Stand: Supervision            Ambulation/Gait Ambulation/Gait assistance: Supervision Gait Distance (Feet): 15 Feet Assistive device: IV Pole Gait Pattern/deviations: Step-to pattern Gait velocity: reduced Gait velocity interpretation: <1.31 ft/sec, indicative of household ambulator General Gait Details: pt with short step to pattern, increased lateral sway, slow and methodical  Stairs            Wheelchair Mobility    Modified Rankin (Stroke  Patients Only)       Balance Overall balance assessment: Needs assistance Sitting-balance support: No upper extremity supported;Feet supported Sitting balance-Leahy Scale: Good     Standing balance support: Single extremity supported Standing balance-Leahy Scale: Good Standing balance comment: supervision-modI                             Pertinent Vitals/Pain Pain Assessment: 0-10 Pain Score: 8  Pain Location: UE, neck Pain Descriptors / Indicators: Aching;Burning Pain Intervention(s): Limited activity within patient's tolerance    Home Living Family/patient expects to be discharged to:: Private residence Living Arrangements: Alone Available Help at Discharge: (not determined) Type of Home: House Home Access: Level entry     Home Layout: One level Home Equipment: Walker - 4 wheels;Bedside commode      Prior Function Level of Independence: Independent         Comments: pt was driving, grocery shopping, etc.      Hand Dominance   Dominant Hand: Right    Extremity/Trunk Assessment   Upper Extremity Assessment Upper Extremity Assessment: RUE deficits/detail;LUE deficits/detail;Generalized weakness RUE Deficits / Details: Shoulder flexion limited to 90 degrees AROM 2/2 pain, strength assessment deferred 2/2 pain but at least 3/5 based on observed motion RUE Sensation: decreased light touch LUE Deficits / Details: Shoulder flexion limited to 90 degrees AROM 2/2 pain, strength assessment deferred 2/2 pain but at least 3/5 based on observed motion LUE Sensation: decreased light touch    Lower Extremity Assessment Lower Extremity Assessment: Generalized weakness    Cervical / Trunk Assessment Cervical / Trunk  Assessment: Normal  Communication   Communication: No difficulties  Cognition Arousal/Alertness: Awake/alert Behavior During Therapy: WFL for tasks assessed/performed Overall Cognitive Status: Within Functional Limits for tasks assessed                                         General Comments General comments (skin integrity, edema, etc.): VSS during session, pt limited by significant UE pain as well as reflux pain    Exercises General Exercises - Upper Extremity Shoulder Flexion: AROM;Both;5 reps Shoulder ABduction: AROM;Both;5 reps Elbow Flexion: AROM;Both;5 reps Elbow Extension: AROM;Both;5 reps Wrist Flexion: AROM;Both;5 reps Wrist Extension: AROM;Both;5 reps Digit Composite Flexion: AROM;Both;5 reps Composite Extension: AROM;Both;5 reps Other Exercises Other Exercises: Pendulum exercise- pt declining 2/2 GERD Other Exercises: Shoulder flexion AAROM with towel on table   Assessment/Plan    PT Assessment Patient needs continued PT services  PT Problem List Decreased strength;Decreased activity tolerance;Decreased balance;Decreased mobility;Pain;Impaired sensation       PT Treatment Interventions DME instruction;Gait training;Functional mobility training;Therapeutic activities;Therapeutic exercise;Balance training;Neuromuscular re-education;Patient/family education    PT Goals (Current goals can be found in the Care Plan section)  Acute Rehab PT Goals Patient Stated Goal: To maintain UE strength and mobility PT Goal Formulation: With patient Time For Goal Achievement: 01/20/19 Potential to Achieve Goals: Good    Frequency Min 2X/week   Barriers to discharge        Co-evaluation               AM-PAC PT "6 Clicks" Mobility  Outcome Measure Help needed turning from your back to your side while in a flat bed without using bedrails?: None Help needed moving from lying on your back to sitting on the side of a flat bed without using bedrails?: A Little Help needed moving to and from a bed to a chair (including a wheelchair)?: None Help needed standing up from a chair using your arms (e.g., wheelchair or bedside chair)?: None Help needed to walk in hospital room?: None Help needed  climbing 3-5 steps with a railing? : A Little 6 Click Score: 22    End of Session Equipment Utilized During Treatment: (none) Activity Tolerance: Patient limited by pain Patient left: in bed;with call bell/phone within reach Nurse Communication: Mobility status PT Visit Diagnosis: Pain;Unsteadiness on feet (R26.81) Pain - Right/Left: (entire upper body) Pain - part of body: Shoulder;Arm;Hand    Time: 8882-8003 PT Time Calculation (min) (ACUTE ONLY): 36 min   Charges:   PT Evaluation $PT Eval Moderate Complexity: 1 Mod PT Treatments $Therapeutic Exercise: 8-22 mins        Zenaida Niece, PT, DPT Acute Rehabilitation Pager: [phone]   Zenaida Niece 01/06/2019, 2:41 PM

## 2019-01-06 NOTE — Progress Notes (Signed)
Brief Narrative:  73 y.o. female with medical history significant of pulmonary embolism on Xarelto, bilateral carotid stenosis, systemic lupus, asthma, pulmonary nodules, pancreatic insufficiency in chronic diarrhea who presents with concerns of persistent diarrhea.  She recently saw her GI this week for this problem and was started on probiotic but discontinued it after two doses since it made her vomit.  She then saw them a second time yesterday and had stool studies done that are still pending. 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 She believes the diarrhea started at the same time she started taking her prednisone. Patient's overall issue with diarrhea started back in June and she was hospitalized in June and July for the same and also had acute renal failure for dehydration.  She had a largely extensive work-up including C. Difficile, GI pathogen panel, upper and lower endoscopy, CT abd/pelvis that was negative at that time.  Also underwent colonoscopy on 08/22/2018 with negative biopsy. She has since followed with outpatient GI Dr. Collene Mares and was started on Creon in July and had some improvement to her symptoms.   Subjective: Patient has had five watery bowel movements since past midnight until this morning. Patient says it is watery but there is no blood present.  Patient has mild abdominal discomfort but denies shortness of breath, chest pain, palpitation.  She says her facial redness is coming back for which she used some steroid topical cream in the past at home.  Patient also gets mildly nauseated and asked for Zofran. She also wants to see GI, Dr. Collene Mares who she used to see outpatient before being admitted here.  Objective: Vital signs in last 24 hours: Temp:  [97.9 F (36.6 C)-98.8 F (37.1 C)] 98 F (36.7 C) (11/22 0533) Pulse Rate:  [66-75] 66 (11/22 0533) Resp:   [14-18] 14 (11/22 0533) BP: (117-132)/(59-70) 129/64 (11/22 0533) SpO2:  [99 %] 99 % (11/22 0533)  Intake/Output from previous day: 11/21 0701 - 11/22 0700 In: 762.2 [P.O.:420; I.V.:342.2] Out: 1 [Urine:1] Intake/Output this shift: Total I/O In: 277 [P.O.:277] Out: -   Eyes: PERRL, lids and conjunctivae normal ENMT: Mucous membranes are moist. Posterior pharynx clear of any exudate or lesions.Normal dentition.  Neck: normal, supple, no masses Respiratory: clear to auscultation bilaterally, no wheezing, no crackles. Normal respiratory effort. No accessory muscle use.  Cardiovascular: Regular rate and rhythm, no murmurs / rubs / gallops. No extremity edema. 2+ pedal pulses.  Abdomen: no tenderness, no masses palpated.  Hyperactive bowel sounds throughout.  musculoskeletal: no clubbing / cyanosis. No joint deformity upper and lower extremities. Good ROM, no contractures. Normal muscle tone.  Skin:  Mildly reddish rashes on hands and face, lesions, ulcers. No induration Neurologic: CN 2-12 grossly intact. Sensation intact. Strength 5/5 in all 4.  Psychiatric: Normal judgment and insight. Alert and oriented x 3. Slightly anxious.    Results for orders placed or performed during the hospital encounter of 01/04/19 (from the past 24 hour(s))  Glucose, capillary     Status: None   Collection Time: 01/05/19  4:46 PM  Result Value Ref Range   Glucose-Capillary 79 70 - 99 mg/dL  Glucose, capillary     Status: None   Collection Time: 01/05/19  9:12 PM  Result Value Ref Range   Glucose-Capillary 87 70 - 99 mg/dL  CBC     Status: Abnormal   Collection  Time: 01/06/19  2:22 AM  Result Value Ref Range   WBC 7.0 4.0 - 10.5 K/uL   RBC 3.20 (L) 3.87 - 5.11 MIL/uL   Hemoglobin 10.1 (L) 12.0 - 15.0 g/dL   HCT 30.4 (L) 36.0 - 46.0 %   MCV 95.0 80.0 - 100.0 fL   MCH 31.6 26.0 - 34.0 pg   MCHC 33.2 30.0 - 36.0 g/dL   RDW 13.2 11.5 - 15.5 %   Platelets 301 150 - 400 K/uL   nRBC 0.0 0.0 - 0.2 %   Glucose, capillary     Status: None   Collection Time: 01/06/19  7:34 AM  Result Value Ref Range   Glucose-Capillary 80 70 - 99 mg/dL  Glucose, capillary     Status: None   Collection Time: 01/06/19  7:59 AM  Result Value Ref Range   Glucose-Capillary 90 70 - 99 mg/dL  Basic metabolic panel     Status: Abnormal   Collection Time: 01/06/19  9:33 AM  Result Value Ref Range   Sodium 141 135 - 145 mmol/L   Potassium 3.4 (L) 3.5 - 5.1 mmol/L   Chloride 119 (H) 98 - 111 mmol/L   CO2 16 (L) 22 - 32 mmol/L   Glucose, Bld 104 (H) 70 - 99 mg/dL   BUN 5 (L) 8 - 23 mg/dL   Creatinine, Ser 1.03 (H) 0.44 - 1.00 mg/dL   Calcium 9.4 8.9 - 10.3 mg/dL   GFR calc non Af Amer 53 (L) >60 mL/min   GFR calc Af Amer >60 >60 mL/min   Anion gap 6 5 - 15  Glucose, capillary     Status: None   Collection Time: 01/06/19 11:16 AM  Result Value Ref Range   Glucose-Capillary 94 70 - 99 mg/dL    Studies/Results: Xr Hand 2 View Left  Result Date: 12/24/2018 No MCP PIP or DIP narrowing was noted.  No intercarpal radiocarpal joint space narrowing was noted.  No erosive changes were noted.  Generalized osteopenia was noted. Impression: Unremarkable x-ray of the hand.  Xr Hand 2 View Right  Result Date: 12/24/2018 No MCP PIP or DIP narrowing was noted.  No intercarpal radiocarpal joint space narrowing was noted.  No erosive changes were noted.  Generalized osteopenia was noted. Impression: Unremarkable x-ray of the hand.  Xr Pelvis 1-2 Views  Result Date: 12/24/2018 No SI joint narrowing or sclerosis was noted.  Severe lumbar spine scoliosis was noted. Impression: Unremarkable x-ray of the SI joints.   Scheduled Meds: . diphenoxylate-atropine  1 tablet Oral TID AC & HS  . feeding supplement  1 Container Oral TID BM  . insulin aspart  0-9 Units Subcutaneous TID WC  . lipase/protease/amylase  72,000 Units Oral TID AC  . multivitamin with minerals  1 tablet Oral Daily  . nebivolol  2.5 mg Oral Daily  .  omeprazole  20 mg Oral Daily  . rivaroxaban  15 mg Oral Q supper   Continuous Infusions: . sodium chloride 100 mL/hr at 01/06/19 1234   PRN Meds:ondansetron (ZOFRAN) IV, triamcinolone cream  Assessment/Plan:  Acute on chronic diarrhea: Patient says it is a worsening -Consulted GI.recommendation: Change Lomotil to 2 pills four times daily as needed and doubled Creon home dose -continue soft diet -Appreciate GI recs - Has had extensive workup in June and July for the same with negative findings. Also just followed up with GI outpatient - GI pathogen --> negative and C.difficle test negative - Also cont to avoid any  kind of lactose-containing food or dairy products at this time. - Continue current diet  Acute kidney injury  -Improving to 1.o3 from yesterday -Continue IV hydration; may switch if patient's p.o. intake improves - avoid nephrotoxic agent - follow BMP  Hyponatremia --resolved - 141 today from 137 - Sodium of 129 on admission. Urine sodium shows hypovolemic hyponatremia which aligns with her clinical picture -May repeat BMP following fluid hydration   Type 2 diabetes -Blood glucose controlled  Continue sensitive SSI   Hx of PE; no acute issue at this moment - On Xarelto 15 mg daily per patient and her medical record.  It appears that her Xarelto dose has recently changed down to 50 mg from 20 mg.  Was on hold due to decrease creatinine clearance on admission and following day.  As patient's creatinine and GFR improved Xarelto home dose resumed.  Advised to follow-up with her PCP are however started her on this dose to reconsider changing the dose to 20 mg.  Follow-up outpatient -Patient thinks she was developing some rash that might be due to heparin.  Discontinued heparin as Xarelto resumed per pharmacy.  Hypertension -Stable -Continue Bsytolic home dose -Continue to hold Benicar due to AKI.  Explained to patient.  Systemic Lupus with inflammatory  arthritis - stable - follows with Rheumatology outpatient  Facial rash: Patient has had it over the past years which fluctuates In the morning she had some reddish nevus on her face which she thinks is worse than her usual state.  She says she use some steroid cream. Mary start her on triamcinolone cream twice daily as needed Monitor closely  DVT prophylaxis: On xarelto   Debility: As her diarrhea improves may start getting some physical therapy.  Code Status:Full    LOS: 2 days   Lenus Trauger Izetta Dakin

## 2019-01-06 NOTE — Consult Note (Addendum)
Consultation  Referring Provider: TRH/Alam MD  primary Care Physician:  [name], MD Primary Gastroenterologist:  Dr.Mann  Reason for Consultation: Acute on chronic diarrhea  HPI: [name] is a 73 y.o. female, known to Dr. Collene Mares who has been followed for chronic diarrhea, and carries diagnosis of pancreatic exocrine insufficiency.  She underwent fairly extensive work-up in July 2020 for diarrhea including upper endoscopy and colonoscopy which were unrevealing.  Random biopsies of the colon were negative for microscopic colitis.  She also had CT imaging at that time unremarkable.  She was eventually started on Creon and has been taking 36,000 units with each meal.  She did have response with improvement in her symptoms. Apparently over the past 8 to 10 days she has had significant increase in diarrhea with up to 7-8 loose to watery bowel movements per day despite taking Lomotil twice daily.  She had been seen at Dr. Lorie Apley office last week, had stool studies done.  C. difficile quick screen is negative as is GI path panel.  She was admitted with progressive diarrhea. She did have bump in creatinine up to 1.55 on admit. COVID-19 negative.  Today's labs hemoglobin 10.1/stable potassium 3.4, creatinine improved at 0.3  Patient also has history of psoriatic arthritis, and new diagnosis of lupus with associated inflammatory arthritis adult onset diabetes mellitus, prior history of pulmonary embolus on chronic Xarelto, and carotid stenosis.   Patient says she had been doing well from a diarrhea standpoint and was usually just having 1 bowel movement per day over the past couple of months.  Her current symptoms started fairly abruptly about a week ago when she had multiple loose to watery stools last Monday morning.  The week prior to that she had started a course of prednisone low-dose, initially at 5 mg/day then increased to 10 mg/day.  She has now been off of prednisone completely  over the past week. She was to start on Plaquenil but never actually started this.  No other new medications, no recent antibiotics.  No known infectious exposures.  She has had some vague nausea, no complaints of abdominal pain or cramping, no hematochezia.  No fever or chills. Since being hospitalized on Friday she has been on a clear liquid diet and says she thinks this is actually making her diarrhea worse and She had 8 bowel movements yesterday and has had 5 this morning so far. She has been taking Lomotil 4 daily since admit and has continued on usual dose of Creon.  Past Medical History:  Diagnosis Date  . ALLERGIC RHINITIS   . ANEMIA-NOS   . Arthritis   . ASTHMA   . Carpal tunnel syndrome   . COLONIC POLYPS, HX OF   . Coronary artery disease    "mild CAD" noted on 12/05/17 in coronary CT scan  . DIABETES MELLITUS, TYPE II    diet controlled  . GERD   . HYPERLIPIDEMIA   . HYPERTENSION   . OSTEOPENIA   . PONV (postoperative nausea and vomiting)   . Psoriasis    severe, began soriatane 01/2012  . Rectal fissure   . Scoliosis     Past Surgical History:  Procedure Laterality Date  . benign rectal growth  2004   removed by Dr. Zella Richer  . BIOPSY  08/09/2018   Procedure: BIOPSY;  Surgeon: Carol Ada, MD;  Location: [facility];  Service: Endoscopy;;  . BIOPSY  08/22/2018   Procedure: BIOPSY;  Surgeon: Juanita Craver, MD;  Location: [facility] ENDOSCOPY;  Service: Endoscopy;;  . BREAST BIOPSY Right   . BREAST EXCISIONAL BIOPSY Left   . BREAST SURGERY  1988   biopsy  . CESAREAN SECTION    . COLONOSCOPY N/A 08/22/2018   Procedure: COLONOSCOPY;  Surgeon: Juanita Craver, MD;  Location: [facility] ENDOSCOPY;  Service: Endoscopy;  Laterality: N/A;  . ESOPHAGOGASTRODUODENOSCOPY (EGD) WITH PROPOFOL N/A 08/04/2018   Procedure: ESOPHAGOGASTRODUODENOSCOPY (EGD) WITH PROPOFOL;  Surgeon: Ladene Artist, MD;  Location: [place];  Service: Gastroenterology;  Laterality: N/A;  . FLEXIBLE  SIGMOIDOSCOPY N/A 08/04/2018   Procedure: FLEXIBLE SIGMOIDOSCOPY;  Surgeon: Ladene Artist, MD;  Location: [place];  Service: Gastroenterology;  Laterality: N/A;  . FLEXIBLE SIGMOIDOSCOPY N/A 08/09/2018   Procedure: FLEXIBLE SIGMOIDOSCOPY;  Surgeon: Carol Ada, MD;  Location: [place];  Service: Endoscopy;  Laterality: N/A;  . VIDEO BRONCHOSCOPY WITH ENDOBRONCHIAL NAVIGATION N/A 03/14/2018   Procedure: VIDEO BRONCHOSCOPY WITH ENDOBRONCHIAL NAVIGATION;  Surgeon: Collene Gobble, MD;  Location: [facility] OR;  Service: Thoracic;  Laterality: N/A;    Prior to Admission medications   Medication Sig Start Date End Date Taking? Authorizing Provider  Azelaic Acid 15 % cream Apply 1 application topically 2 (two) times daily.  10/08/18  Yes [provider]  BENICAR 20 MG tablet Take one tablet daily 12/19/18  Yes Burns, Claudina Lick, MD  Calcium Carbonate Antacid (TUMS CHEWY BITES PO) Take 1 tablet by mouth daily as needed (For heartburn). After each meal    Yes [provider]  clobetasol (TEMOVATE) 0.05 % external solution Apply 1 application topically every morning.  10/08/18 10/08/19 Yes [provider]  diphenoxylate-atropine (LOMOTIL) 2.5-0.025 MG tablet Take by mouth 2 (two) times daily as needed for diarrhea or loose stools.   Yes [provider]  halobetasol (ULTRAVATE) 0.05 % cream Apply 1 application topically 2 (two) times daily.  10/08/18  Yes [provider]  lipase/protease/amylase (CREON) 36000 UNITS CPEP capsule Take 1 capsule (36,000 Units total) by mouth 3 (three) times daily with meals. 08/29/18  Yes Aline August, MD  nebivolol (BYSTOLIC) 2.5 MG tablet TAKE ONE (1) TABLET BY MOUTH EVERY DAY Patient taking differently: Take 2.5 mg by mouth daily.  12/19/18  Yes Burns, Claudina Lick, MD  omeprazole (PRILOSEC) 20 MG capsule Take 20 mg by mouth daily.   Yes [provider]  ondansetron (ZOFRAN) 4 MG tablet Take 1 tablet (4 mg total) by mouth every 8  (eight) hours as needed for nausea or vomiting. 09/05/18  Yes Burns, Claudina Lick, MD  ONETOUCH ULTRA test strip USE TEST STRIPS TO CHECK BLOOD SUGAR AT LEAST 3 TIMES DAILY 11/28/18  Yes [name], MD  Rivaroxaban (XARELTO) 15 MG TABS tablet Take 1 tablet (15 mg total) by mouth daily with supper. Patient taking differently: Take 15 mg by mouth daily.  09/07/18  Yes Dorothy Spark, MD    Current Facility-Administered Medications  Medication Dose Route Frequency Provider Last Rate Last Dose  . 0.9 %  sodium chloride infusion   Intravenous Continuous Tu, Ching T, DO 100 mL/hr at 01/05/19 2208    . diphenoxylate-atropine (LOMOTIL) 2.5-0.025 MG per tablet 1 tablet  1 tablet Oral TID AC & HS Tu, Ching T, DO   1 tablet at 01/06/19 0736  . feeding supplement (BOOST / RESOURCE BREEZE) liquid 1 Container  1 Container Oral TID BM Thornell Mule, MD   1 Container at 01/06/19 [phone]  . insulin aspart (novoLOG) injection 0-9 Units  0-9 Units Subcutaneous  TID WC Tu, Ching T, DO      . lipase/protease/amylase (CREON) capsule 36,000 Units  36,000 Units Oral TID WC Tu, Ching T, DO   36,000 Units at 01/06/19 0736  . multivitamin with minerals tablet 1 tablet  1 tablet Oral Daily Thornell Mule, MD   1 tablet at 01/06/19 0911  . nebivolol (BYSTOLIC) tablet 2.5 mg  2.5 mg Oral Daily Tu, Ching T, DO   2.5 mg at 01/06/19 0911  . omeprazole (PRILOSEC) capsule 20 mg  20 mg Oral Daily Thornell Mule, MD      . ondansetron [facility]) injection 4 mg  4 mg Intravenous Q8H PRN Thornell Mule, MD   4 mg at 01/06/19 0739  . Rivaroxaban (XARELTO) tablet 15 mg  15 mg Oral Q supper Thornell Mule, MD   15 mg at 01/05/19 1813  . triamcinolone cream (KENALOG) 0.5 %   Topical BID PRN Thornell Mule, MD        Allergies as of 01/04/2019 - Review Complete 01/04/2019  Allergen Reaction Noted  . Phenergan [promethazine hcl] Shortness Of Breath and Anxiety 08/02/2018  . Amlodipine Swelling 05/30/2017  . Clarithromycin Nausea Only   .  Codeine Nausea Only   . Erythromycin  08/13/2012  . Statins  12/28/2012  . Tetracycline Other (See Comments)     Family History  Problem Relation Age of Onset  . Lung cancer Father   . Arthritis Other        Parents  . Asthma Other        parent, other relative  . Breast cancer Other        other relative  . Hypertension Other        parent, other relative  . Heart disease Other        parent, other relative  . Heart disease Mother   . Asthma Mother   . Breast cancer Maternal Aunt 68  . Parkinson's disease Maternal Grandmother   . Rheumatic fever Maternal Grandfather     Social History   Socioeconomic History  . Marital status: Single    Spouse name: Not on file  . Number of children: 1  . Years of education: 31  . Highest education level: Bachelor's degree (e.g., BA, AB, BS)  Occupational History  . Occupation: retired Careers adviser  Social Needs  . Financial resource strain: Not on file  . Food insecurity    Worry: Not on file    Inability: Not on file  . Transportation needs    Medical: Not on file    Non-medical: Not on file  Tobacco Use  . Smoking status: Former Smoker    Packs/day: 1.00    Years: 10.00    Pack years: 10.00    Quit date: 02/14/1974    Years since quitting: 44.9  . Smokeless tobacco: Never Used  Substance and Sexual Activity  . Alcohol use: No  . Drug use: No  . Sexual activity: Not on file  Lifestyle  . Physical activity    Days per week: Not on file    Minutes per session: Not on file  . Stress: Not on file  Relationships  . Social Herbalist on phone: Not on file    Gets together: Not on file    Attends religious service: Not on file    Active member of club or organization: Not on file    Attends meetings of clubs or organizations: Not on file  Relationship status: Not on file  . Intimate partner violence    Fear of current or ex partner: Not on file    Emotionally abused: Not on  file    Physically abused: Not on file    Forced sexual activity: Not on file  Other Topics Concern  . Not on file  Social History Narrative   Lives alone in a one story home.  Has one child and one grandchild.  Retired Careers adviser.  Education: college.     Review of Systems: Pertinent positive and negative review of systems were noted in the above HPI section.  All other review of systems was otherwise negative.  Physical Exam: Vital signs in last 24 hours: Temp:  [97.9 F (36.6 C)-98.8 F (37.1 C)] 98 F (36.7 C) (11/22 0533) Pulse Rate:  [66-75] 66 (11/22 0533) Resp:  [14-18] 14 (11/22 0533) BP: (117-132)/(59-70) 129/64 (11/22 0533) SpO2:  [99 %] 99 % (11/22 0533) Last BM Date: 01/06/19 General:   Alert,  Well-developed, well-nourished, elderly white female pleasant and cooperative in NAD Head:  Normocephalic and atraumatic. Eyes:  Sclera clear, no icterus.   Conjunctiva pink. Ears:  Normal auditory acuity. Nose:  No deformity, discharge,  or lesions. Mouth:  No deformity or lesions.   Neck:  Supple; no masses or thyromegaly. Lungs:  Clear throughout to auscultation.   No wheezes, crackles, or rhonchi. Heart:  Regular rate and rhythm; no murmurs, clicks, rubs,  or gallops. Abdomen:  Soft,nontender, BS active,nonpalp mass or hsm.   Rectal:  Deferred  Msk:  Symmetrical without gross deformities. . Pulses:  Normal pulses noted. Extremities: Right wrist and forearm slightly swollen and erythematous. Neurologic:  Alert and  oriented x4;  grossly normal neurologically. Skin:  Intact without significant lesions or rashes.. Psych:  Alert and cooperative. Normal mood and affect.  Intake/Output from previous day: 11/21 0701 - 11/22 0700 In: 762.2 [P.O.:420; I.V.:342.2] Out: 1 [Urine:1] Intake/Output this shift: Total I/O In: 277 [P.O.:277] Out: -   Lab Results: Recent Labs    01/04/19 1644 01/05/19 0722 01/06/19 0222  WBC 7.9 6.6 7.0   HGB 11.0* 10.6* 10.1*  HCT 33.6* 31.9* 30.4*  PLT 344 333 301   BMET Recent Labs    01/04/19 1644 01/05/19 0722 01/06/19 0933  NA 129* 137 141  K 3.5 4.3 3.4*  CL 101 112* 119*  CO2 18* 14* 16*  GLUCOSE 95 85 104*  BUN 15 11 5*  CREATININE 1.55* 1.18* 1.03*  CALCIUM 8.9 8.7* 9.4   LFT Recent Labs    01/05/19 0722  PROT 6.2*  ALBUMIN 3.1*  AST 15  ALT 18  ALKPHOS 71  BILITOT 0.9   PT/INR No results for input(s): LABPROT, INR in the last 72 hours. Hepatitis Panel No results for input(s): HEPBSAG, HCVAB, HEPAIGM, HEPBIGM in the last 72 hours.     IMPRESSION:  #69 73 year old white female with chronic diarrhea over the past 6 months, extensive work-up was done in July 2020 and ultimately to have pancreatic exocrine insufficiency.  She had been started on Creon 36,000 units AC and had done well. Now with acute worsening of diarrhea over the past week with up to 7-8 loose to watery bowel movements per day. Thus far infectious work-up has been unrevealing.  The only different medication she had taken was a very short course of low-dose prednisone.  Etiology of rapidly worsening diarrhea is not clear. She does have a new diagnosis of lupus,  consider cute enteropathy Consider SIBO  #2 new diagnosis of lupus with inflammatory arthropathy 3 history of psoriatic arthritis #4 adult onset diabetes mellitus #5 prior history of PE-on chronic Xarelto #6 carotid stenosis #7 acute kidney injury resolved  Plan; agree with advancing diet to soft low residue diet Increase Lomotil to 2 p.o. 4 times daily as needed Will increase Creon to 72,000 units 30 minutes AC Dr. Collene Mares will assume GI care tomorrow.     Amy EsterwoodPA-C  01/06/2019, 11:13 AM  I have reviewed the entire case in detail with the above APP and discussed the plan in detail.  Therefore, I agree with the diagnoses recorded above. In addition,  I have personally interviewed and examined the patient..  My  additional thoughts are as follows:  Patient was on chronic therapy for EPI, then had a sudden worsening of symptoms about a week ago and just saw Dr. Collene Mares in the office early last week. Because of sudden worsening of symptoms a week ago is unclear, no infectious source identified at this time. She wondered if it was from insufficient dose for pancreatic enzymes.  That is possible, but would not expect that to cause a sudden worsening of symptoms. Complex rheumatologic history, also somewhat tangential and difficult historian. Details of work-up unknown at present, records of Dr. Lorie Apley office.  Generally celiac sprue has been ruled out, consider small bowel bacterial overgrowth.  We have started with the medicine changes as noted above.  Drs. [place] will round on this patient tomorrow for further plans.   Nelida Meuse III Office:[phone]

## 2019-01-07 DIAGNOSIS — N179 Acute kidney failure, unspecified: Secondary | ICD-10-CM

## 2019-01-07 LAB — CBC
HCT: 29 % — ABNORMAL LOW (ref 36.0–46.0)
Hemoglobin: 9.5 g/dL — ABNORMAL LOW (ref 12.0–15.0)
MCH: 31 pg (ref 26.0–34.0)
MCHC: 32.8 g/dL (ref 30.0–36.0)
MCV: 94.8 fL (ref 80.0–100.0)
Platelets: 288 10*3/uL (ref 150–400)
RBC: 3.06 MIL/uL — ABNORMAL LOW (ref 3.87–5.11)
RDW: 13.4 % (ref 11.5–15.5)
WBC: 8.2 10*3/uL (ref 4.0–10.5)
nRBC: 0 % (ref 0.0–0.2)

## 2019-01-07 LAB — GLUCOSE, CAPILLARY
Glucose-Capillary: 83 mg/dL (ref 70–99)
Glucose-Capillary: 91 mg/dL (ref 70–99)
Glucose-Capillary: 102 mg/dL — ABNORMAL HIGH (ref 70–99)
Glucose-Capillary: 110 mg/dL — ABNORMAL HIGH (ref 70–99)

## 2019-01-07 LAB — BASIC METABOLIC PANEL
Anion gap: 7 (ref 5–15)
BUN: <5 mg/dL — ABNORMAL LOW (ref 8–23)
CO2: 14 mmol/L — ABNORMAL LOW (ref 22–32)
Calcium: 8.9 mg/dL (ref 8.9–10.3)
Chloride: 116 mmol/L — ABNORMAL HIGH (ref 98–111)
Creatinine, Ser: 0.9 mg/dL (ref 0.44–1.00)
GFR calc Af Amer: >60 mL/min (ref >60)
GFR calc non Af Amer: >60 mL/min (ref >60)
Glucose, Bld: 108 mg/dL — ABNORMAL HIGH (ref 70–99)
Potassium: 3 mmol/L — ABNORMAL LOW (ref 3.5–5.1)
Sodium: 137 mmol/L (ref 135–145)

## 2019-01-07 LAB — MAGNESIUM: Magnesium: 1 mg/dL — ABNORMAL LOW (ref 1.7–2.4)

## 2019-01-07 MED ORDER — POTASSIUM CHLORIDE IN NACL 20-0.9 MEQ/L-% IV SOLN
INTRAVENOUS | Status: DC
  Administered 2019-01-07 – 2019-01-12 (×8): via INTRAVENOUS
  Filled 2019-01-07 (×12): qty 1000

## 2019-01-07 MED ORDER — POTASSIUM CHLORIDE 10 MEQ/100ML IV SOLN
10.0000 meq | INTRAVENOUS | Status: AC
  Administered 2019-01-07 (×4): 10 meq via INTRAVENOUS
  Filled 2019-01-07 (×3): qty 100

## 2019-01-07 MED ORDER — SODIUM CHLORIDE 0.9 % IV BOLUS
1000.0000 mL | Freq: Once | INTRAVENOUS | Status: AC
  Administered 2019-01-07: 1000 mL via INTRAVENOUS

## 2019-01-07 MED ORDER — MAGNESIUM SULFATE 2 GM/50ML IV SOLN
2.0000 g | Freq: Once | INTRAVENOUS | Status: AC
  Administered 2019-01-07: 2 g via INTRAVENOUS
  Filled 2019-01-07: qty 50

## 2019-01-07 MED ORDER — POTASSIUM CHLORIDE 10 MEQ/100ML IV SOLN
INTRAVENOUS | Status: AC
  Filled 2019-01-07: qty 100

## 2019-01-07 NOTE — Progress Notes (Signed)
Subjective: Since I last evaluated the patient, she seems to be doing somewhat better. She has had 1 BM this morning that she describes as "muddy". She has been slightly nauseated but has not vomited. She has been having a lot of joint pain in her hands and wrists. She feels the higher dose of pancreatic enzyme supplements might be helping her diarrhea. She wants to be careful with using the Lomotil as she tends to get constipated if she uses it too often. She has recently been diagnosed with SLE by Dr. Bo Merino and see feels the patient needs to be referred to [facility] for another opinion as she does not seem to agree with any of Dr. Arlean Hopping recommendations. Additionally, the use of Plaquenil may cause diarrhea and therefore treatments options will need to be discussed with her in great detail before any new treatment is started for the SLE. Kathe is worried about her BP mediations being held for now and the risk of stroke.  Objective: Vital signs in last 24 hours: Temp:  [98 F (36.7 C)-98.9 F (37.2 C)] 98 F (36.7 C) (11/23 1400) Pulse Rate:  [62-71] 62 (11/23 1400) Resp:  [16-17] 17 (11/23 1400) BP: (139-147)/(74-76) 140/76 (11/23 1400) SpO2:  [98 %-100 %] 100 % (11/23 1400) Last BM Date: 01/07/19  Intake/Output from previous day: 11/22 0701 - 11/23 0700 In: 577 [P.O.:577] Out: -  Intake/Output this shift: Total I/O In: 440 [P.O.:240; IV Piggyback:200] Out: -   General appearance: alert, cooperative, appears stated age, fatigued, no distress and pale Resp: clear to auscultation bilaterally Cardio: regular rate and rhythm, S1, S2 normal, no murmur, click, rub or gallop GI: soft, non-tender; bowel sounds normal; no masses,  no organomegaly Extremities: swelling and redness is noted over joints of both hands; lower extremities normal, atraumatic, no cyanosis or edema  Lab Results: Recent Labs    01/05/19 0722 01/06/19 0222 01/07/19 0456  WBC 6.6 7.0 8.2  HGB 10.6*  10.1* 9.5*  HCT 31.9* 30.4* 29.0*  PLT 333 301 288   BMET Recent Labs    01/05/19 0722 01/06/19 0933 01/07/19 1024  NA 137 141 137  K 4.3 3.4* 3.0*  CL 112* 119* 116*  CO2 14* 16* 14*  GLUCOSE 85 104* 108*  BUN 11 5* <5*  CREATININE 1.18* 1.03* 0.90  CALCIUM 8.7* 9.4 8.9   LFT Recent Labs    01/05/19 0722  PROT 6.2*  ALBUMIN 3.1*  AST 15  ALT 18  ALKPHOS 71  BILITOT 0.9   C-Diff Recent Labs    01/04/19 2020  CDIFFTOX NEGATIVE   Medications: I have reviewed the patient's current medications.  Assessment/Plan: 1) Recurrent diarrhea: I feel Totiana's diarrhea is multifactorial. 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 She also has a component of IBS. Her dehydration and elevated creatinine are concerning and this has happened on her last admission as well. Additional PPI's may be worsening her diarrhea. I had stopped these in the past and her diarrhea got better but she feels her reflux to "too bad" and she cannot doe without using the PPI's. Her diabetes may also be contributing to her diarrhea. 2) GERD on Omeprazole. 3) HTN-ABR's on hold. 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 5) History of PE: On Xarelto. 6) SLE. 7) Anemia of chronic disease. 8) Anxiety disorder. 9) Lung nodules on CT scan. 10) Small vessel cerebrovascular disease.   LOS: 3 days  Juanita Craver 01/07/2019, 4:17 PM

## 2019-01-07 NOTE — Evaluation (Signed)
Occupational Therapy Evaluation Patient Details Name: [name] MRN: [id number] DOB: April 23, 1945 Today's Date: 01/07/2019    History of Present Illness 73 y.o. female with medical history significant of pulmonary embolism on Xarelto, bilateral carotid stenosis, systemic lupus, asthma, pulmonary nodules, pancreatic insufficiency in chronic diarrhea who presents with concerns of persistent diarrhea. Pt with significant pain from Lupus   Clinical Impression   Pt sitting EOB upon arrival to her room. Pt with decline in function and safety with ADLs and ADL mobility with impaired strength, endurance and B UE ROM and is limited by pain in B UEs. Eval limited due to reports of 8/10 B UE pain and pt declined any standing or OOB activity this session. Pt reports that she lives at home alone and was independent with ADLs and ADL mobility, but that it took her increased time to get ready in the mornings due to pain from Lupus. Pt currently requires min A with UB ADLs and would benefit from acute OT services to address impairments to maximize level of function and safety to return home    Follow Up Recommendations  No OT follow up;Other (comment)(could benefit from an aide if continued difficulty with ADLs due to pain form Lupus)    Equipment Recommendations  Other (comment)(reacher, LH bath sponge, sock aid)    Recommendations for Other Services       Precautions / Restrictions Precautions Precautions: Fall Restrictions Weight Bearing Restrictions: No      Mobility Bed Mobility               General bed mobility comments: pt sitting EOB upon arrival  Transfers Overall transfer level: Needs assistance               General transfer comment: pt declined due to pain. Per PT note pt is sup with no AD for mobility    Balance Overall balance assessment: Needs assistance Sitting-balance support: No upper extremity supported;Feet supported Sitting balance-Leahy Scale:  Good         Standing balance comment: declined                           ADL either performed or assessed with clinical judgement   ADL Overall ADL's : Needs assistance/impaired Eating/Feeding: Independent;Sitting   Grooming: Wash/dry hands;Wash/dry face;Set up;Sitting   Upper Body Bathing: Minimal assistance   Lower Body Bathing: Maximal assistance Lower Body Bathing Details (indicate cue type and reason): simulated Upper Body Dressing : Minimal assistance         Toilet Transfer Details (indicate cue type and reason): declined           General ADL Comments: pt with 8/10 b UE pain and declined any standing or OOB acitivty this sesion     Vision Baseline Vision/History: Wears glasses Wears Glasses: At all times Patient Visual Report: No change from baseline       Perception     Praxis      Pertinent Vitals/Pain Pain Assessment: 0-10 Pain Score: 8  Pain Location: B UEs Pain Descriptors / Indicators: Aching;Burning Pain Intervention(s): Limited activity within patient's tolerance;Monitored during session;Repositioned     Hand Dominance Right   Extremity/Trunk Assessment Upper Extremity Assessment Upper Extremity Assessment: Generalized weakness;RUE deficits/detail;LUE deficits/detail RUE Deficits / Details: Shoulder flexion limited to 90 degrees AROM 2/2 pain, strength assessment deferred 2/2 pain but at least 3/5 based on observed motion RUE Sensation: decreased light touch LUE Deficits / Details: Shoulder  flexion limited to 90 degrees AROM 2/2 pain, strength assessment deferred 2/2 pain but at least 3/5 based on observed motion LUE Sensation: decreased light touch   Lower Extremity Assessment Lower Extremity Assessment: Defer to PT evaluation   Cervical / Trunk Assessment Cervical / Trunk Assessment: Normal   Communication Communication Communication: No difficulties   Cognition Arousal/Alertness: Awake/alert Behavior During Therapy:  WFL for tasks assessed/performed Overall Cognitive Status: Within Functional Limits for tasks assessed                                     General Comments       Exercises     Shoulder Instructions      Home Living Family/patient expects to be discharged to:: Private residence Living Arrangements: Alone Available Help at Discharge: Other (Comment)(TBD per pt) Type of Home: House Home Access: Level entry     Home Layout: One level     Bathroom Shower/Tub: [employer] aid/development worker Accessibility: Yes How Accessible: Accessible via walker Home Equipment: [place] - 4 wheels;Bedside commode;Shower seat          Prior Functioning/Environment Level of Independence: Independent        Comments: pt reports that she was indepedent with ADLs/selfcare (just slow due to UE pain), driving, home mgt and grocery shopping        OT Problem List: Decreased strength;Pain;Decreased range of motion;Decreased activity tolerance;Decreased coordination;Decreased knowledge of use of DME or AE;Impaired UE functional use      OT Treatment/Interventions: Self-care/ADL training;DME and/or AE instruction;Therapeutic activities;Therapeutic exercise;Patient/family education    OT Goals(Current goals can be found in the care plan section) Acute Rehab OT Goals Patient Stated Goal: To maintain UE strength and mobility OT Goal Formulation: With patient Time For Goal Achievement: 01/21/19 Potential to Achieve Goals: Good ADL Goals Pt Will Perform Grooming: with supervision;with set-up;with modified independence;standing Pt Will Perform Upper Body Bathing: with supervision;with set-up;with modified independence;sitting Pt Will Perform Lower Body Bathing: with mod assist;with min assist;with adaptive equipment;sitting/lateral leans;sit to/from stand Pt Will Perform Upper Body Dressing: with supervision;with set-up;with modified independence;sitting Pt Will Perform Lower Body  Dressing: with mod assist;with min assist;with adaptive equipment Pt Will Transfer to Toilet: with modified independence;Independently;ambulating Pt Will Perform Toileting - Clothing Manipulation and hygiene: with min assist;sit to/from stand Pt Will Perform Tub/Shower Transfer: with supervision;with modified independence;ambulating;shower seat;grab bars Additional ADL Goal #1: Pt will tolerate B UE exercises to increase ROM, strength and overall functional use  OT Frequency: Min 2X/week   Barriers to D/C:            Co-evaluation              AM-PAC OT "6 Clicks" Daily Activity     Outcome Measure Help from another person eating meals?: None Help from another person taking care of personal grooming?: A Little Help from another person toileting, which includes using toliet, bedpan, or urinal?: A Lot Help from another person bathing (including washing, rinsing, drying)?: A Lot Help from another person to put on and taking off regular upper body clothing?: A Little Help from another person to put on and taking off regular lower body clothing?: A Lot 6 Click Score: 16   End of Session    Activity Tolerance: Patient limited by pain Patient left: in bed(sitting EOB)  OT Visit Diagnosis: Muscle weakness (generalized) (M62.81);Pain;Other symptoms and signs involving the nervous  system (R29.898) Pain - Right/Left: (bilaterally) Pain - part of body: Shoulder;Arm                Time: 1322-1340 OT Time Calculation (min): 18 min Charges:  OT General Charges $OT Visit: 1 Visit OT Evaluation $OT Eval Moderate Complexity: 1 Mod    Britt Bottom 01/07/2019, 2:44 PM

## 2019-01-07 NOTE — Progress Notes (Signed)
PROGRESS NOTE    [name]  QXI:[id number] DOB: [birth year] DOA: 01/04/2019 PCP: [name], MD   Brief Narrative: 73 year old with past medical history significant for PE on Xarelto, bilateral carotid stenosis, systemic lupus, asthma, pulmonary nodules, pancreatic insufficiency with chronic diarrhea who presents with worsening persistent diarrhea.  Patient report her 7-8 episodes of watery diarrhea despite taking Lomotil twice a day.  She is not able to keep herself hydrated.  She saw her gastroenterologist and she was a started on probiotic but this was discontinue, because this was causing nausea vomiting.  Patient received a short course of low-dose prednisone for a flareup of her inflammatory arthritis.  Patient has been having diarrhea since June, she has had extensive work-up with endoscopy, colonoscopy and evaluation for C diff.    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 Lomotil increase to 2 tablets 4 times a day as needed. Diarrhea persist.  Continue with IV fluids. Dr. Collene Mares will follow up on patient today. C. Diff and GI pathogen during this admission negative.  Hypokalemia; replete IV and with IV fluids.   Metabolic Acidosis; related to diarrhea. IV bolus, IV fluids.   AKI;  Presents with cr at 1.5. Likely related to Hypovolemia relate to diarrhea.  Improved with IV fluids.   HTN; Continue with Bystolic. Hold ARB in setting of dehydration and diarrhea.   Hyponatremia; resolved with IV fluids.   DM type 2; on diet controlled.   History of PE;  Was on heparin Gtt due to worsening AKI.  renal function improved, now back on Xarelto.   Systemic  Lupus with inflammatory arthritis; Needs to follow up with Rheumatology.   Facial Rash; started on triamcinolone.     Nutrition Problem: Inadequate oral intake Etiology: acute illness    Signs/Symptoms: per patient/family report       Estimated body mass index is 23.33 kg/m as calculated from the following:   Height as of this encounter: 5' 0.5" (1.537 m).   Weight as of this encounter: 55.1 kg.   DVT prophylaxis: xarelto Code Status: Full  Family Communication: Care discussed with patient.  Disposition Plan: home when diarrhea improved, patient is able to maintain hydration with oral intake. She will need extra IV bolus fluids today.   Consultants:   GI  Procedures:   none  Antimicrobials:  none  Subjective: She had almost 8 watery BM yesterday. Had BM this am 5 am that had some pure consistency.  She would like her diet change to regular diet. Would like to eat sweet potatoes.  She is concern that we are holding benicar. I explain I would like to make sure kidney function is stable and diarrhea is improving prior resuming ARB.    Objective: Vitals:   01/06/19 0533 01/06/19 1451 01/06/19 2142 01/07/19 0956  BP: 129/64 (!) 142/83 (!) 147/76 139/74  Pulse: 66 72 71 65  Resp: 14 16 17 16   Temp: 98 F (36.7 C) 99 F (37.2 C) 98.8 F (37.1 C) 98.9 F (37.2 C)  TempSrc: Oral Oral Oral Oral  SpO2: 99% 98% 98% 100%  Weight:      Height:        Intake/Output  Summary (Last 24 hours) at 01/07/2019 1015 Last data filed at 01/07/2019 1001 Gross per 24 hour  Intake 540 ml  Output -  Net 540 ml   Filed Weights   01/05/19 0200  Weight: 55.1 kg    Examination:  General exam: Appears calm and comfortable  Respiratory system: Clear to auscultation. Respiratory effort normal. Cardiovascular system: S1 & S2 heard, RRR. No JVD, murmurs, rubs, gallops or clicks. No pedal edema. Gastrointestinal system: Abdomen is nondistended, soft and nontender. No  organomegaly or masses felt. Normal bowel sounds heard. Central nervous system: Alert and oriented. No focal neurological deficits. Extremities: Symmetric 5 x 5 power. Skin: No rashes, lesions or ulcers    Data Reviewed: I have personally reviewed following labs and imaging studies  CBC: Recent Labs  Lab 01/04/19 1644 01/05/19 0722 01/06/19 0222 01/07/19 0456  WBC 7.9 6.6 7.0 8.2  HGB 11.0* 10.6* 10.1* 9.5*  HCT 33.6* 31.9* 30.4* 29.0*  MCV 96.0 95.8 95.0 94.8  PLT 344 333 301 762   Basic Metabolic Panel: Recent Labs  Lab 01/04/19 1644 01/05/19 0722 01/06/19 0933  NA 129* 137 141  K 3.5 4.3 3.4*  CL 101 112* 119*  CO2 18* 14* 16*  GLUCOSE 95 85 104*  BUN 15 11 5*  CREATININE 1.55* 1.18* 1.03*  CALCIUM 8.9 8.7* 9.4   GFR: Estimated Creatinine Clearance: 34.8 mL/min (A) (by C-G formula based on SCr of 1.03 mg/dL (H)). Liver Function Tests: Recent Labs  Lab 01/04/19 1644 01/05/19 0722  AST 17 15  ALT 21 18  ALKPHOS 81 71  BILITOT 1.0 0.9  PROT 6.9 6.2*  ALBUMIN 3.5 3.1*   Recent Labs  Lab 01/04/19 1644  LIPASE 17   No results for input(s): AMMONIA in the last 168 hours. Coagulation Profile: No results for input(s): INR, PROTIME in the last 168 hours. Cardiac Enzymes: No results for input(s): CKTOTAL, CKMB, CKMBINDEX, TROPONINI in the last 168 hours. BNP (last 3 results) No results for input(s): PROBNP in the last 8760 hours. HbA1C: No results for input(s): HGBA1C in the last 72 hours. CBG: Recent Labs  Lab 01/06/19 0759 01/06/19 1116 01/06/19 1659 01/06/19 2245 01/07/19 0747  GLUCAP 90 94 102* 103* 83   Lipid Profile: No results for input(s): CHOL, HDL, LDLCALC, TRIG, CHOLHDL, LDLDIRECT in the last 72 hours. Thyroid Function Tests: No results for input(s): TSH, T4TOTAL, FREET4, T3FREE, THYROIDAB in the last 72 hours. Anemia Panel: No results for input(s): VITAMINB12, FOLATE, FERRITIN, TIBC, IRON, RETICCTPCT in the last 72 hours. Sepsis  Labs: No results for input(s): PROCALCITON, LATICACIDVEN in the last 168 hours.  Recent Results (from the past 240 hour(s))  Gastrointestinal Panel by PCR , Stool     Status: None   Collection Time: 01/04/19  8:20 PM   Specimen: Stool  Result Value Ref Range Status   Campylobacter species NOT DETECTED NOT DETECTED Final   Plesimonas shigelloides NOT DETECTED NOT DETECTED Final   Salmonella species NOT DETECTED NOT DETECTED Final   Yersinia enterocolitica NOT DETECTED NOT DETECTED Final   Vibrio species NOT DETECTED NOT DETECTED Final   Vibrio cholerae NOT DETECTED NOT DETECTED Final   Enteroaggregative E coli (EAEC) NOT DETECTED NOT DETECTED Final   Enteropathogenic E coli (EPEC) NOT DETECTED NOT DETECTED Final   Enterotoxigenic E coli (ETEC) NOT DETECTED NOT DETECTED Final   Shiga like toxin producing E coli (STEC) NOT DETECTED NOT DETECTED Final   Shigella/Enteroinvasive E coli (EIEC) NOT DETECTED NOT DETECTED Final  Cryptosporidium NOT DETECTED NOT DETECTED Final   Cyclospora cayetanensis NOT DETECTED NOT DETECTED Final   Entamoeba histolytica NOT DETECTED NOT DETECTED Final   Giardia lamblia NOT DETECTED NOT DETECTED Final   Adenovirus F40/41 NOT DETECTED NOT DETECTED Final   Astrovirus NOT DETECTED NOT DETECTED Final   Norovirus GI/GII NOT DETECTED NOT DETECTED Final   Rotavirus A NOT DETECTED NOT DETECTED Final   Sapovirus (I, II, IV, and V) NOT DETECTED NOT DETECTED Final    Comment: Performed at [facility], [address] [place], [place] [address]  C Difficile Quick Screen w PCR reflex     Status: None   Collection Time: 01/04/19  8:20 PM   Specimen: Stool  Result Value Ref Range Status   C Diff antigen NEGATIVE NEGATIVE Final   C Diff toxin NEGATIVE NEGATIVE Final   C Diff interpretation No C. difficile detected.  Final    Comment: Performed at [facility], [place] [address], [place], [place] [address]  SARS CORONAVIRUS 2 (TAT 6-24 HRS) Nasopharyngeal  Nasopharyngeal Swab     Status: None   Collection Time: 01/04/19  8:46 PM   Specimen: Nasopharyngeal Swab  Result Value Ref Range Status   SARS Coronavirus 2 NEGATIVE NEGATIVE Final    Comment: (NOTE) SARS-CoV-2 target nucleic acids are NOT DETECTED. The SARS-CoV-2 RNA is generally detectable in upper and lower respiratory specimens during the acute phase of infection. Negative results do not preclude SARS-CoV-2 infection, do not rule out co-infections with other pathogens, and should not be used as the sole basis for treatment or other patient management decisions. Negative results must be combined with clinical observations, patient history, and epidemiological information. The expected result is Negative. Fact Sheet for Patients: SugarRoll.be Fact Sheet for Healthcare Providers: https://www.woods-mathews.com/ This test is not yet approved or cleared by the [place] FDA and  has been authorized for detection and/or diagnosis of SARS-CoV-2 by FDA under an Emergency Use Authorization (EUA). This EUA will remain  in effect (meaning this test can be used) for the duration of the COVID-19 declaration under Section 56 4(b)(1) of the Act, 21 U.S.C. section 360bbb-3(b)(1), unless the authorization is terminated or revoked sooner. Performed at [facility], [place] [address], [place], [place] [address]          Radiology Studies: No results found.      Scheduled Meds: . feeding supplement  1 Container Oral TID BM  . insulin aspart  0-9 Units Subcutaneous TID WC  . lipase/protease/amylase  72,000 Units Oral TID AC  . multivitamin with minerals  1 tablet Oral Daily  . nebivolol  2.5 mg Oral Daily  . omeprazole  20 mg Oral Daily  . rivaroxaban  15 mg Oral Q supper   Continuous Infusions: . sodium chloride 100 mL/hr at 01/07/19 0743     LOS: 3 days    Time spent: 35 minutes.     Elmarie Shiley, MD [facility]   If 7PM-7AM, please contact night-coverage [email or web address] Password [facility] 01/07/2019, 10:15 AM

## 2019-01-08 LAB — CBC
HCT: 28.1 % — ABNORMAL LOW (ref 36.0–46.0)
Hemoglobin: 9.3 g/dL — ABNORMAL LOW (ref 12.0–15.0)
MCH: 31.4 pg (ref 26.0–34.0)
MCHC: 33.1 g/dL (ref 30.0–36.0)
MCV: 94.9 fL (ref 80.0–100.0)
Platelets: 269 10*3/uL (ref 150–400)
RBC: 2.96 MIL/uL — ABNORMAL LOW (ref 3.87–5.11)
RDW: 13.4 % (ref 11.5–15.5)
WBC: 6.2 10*3/uL (ref 4.0–10.5)
nRBC: 0 % (ref 0.0–0.2)

## 2019-01-08 LAB — BASIC METABOLIC PANEL
Anion gap: 6 (ref 5–15)
BUN: 5 mg/dL — ABNORMAL LOW (ref 8–23)
CO2: 16 mmol/L — ABNORMAL LOW (ref 22–32)
Calcium: 8.7 mg/dL — ABNORMAL LOW (ref 8.9–10.3)
Chloride: 116 mmol/L — ABNORMAL HIGH (ref 98–111)
Creatinine, Ser: 0.91 mg/dL (ref 0.44–1.00)
GFR calc Af Amer: >60 mL/min (ref >60)
GFR calc non Af Amer: >60 mL/min (ref >60)
Glucose, Bld: 92 mg/dL (ref 70–99)
Potassium: 3.8 mmol/L (ref 3.5–5.1)
Sodium: 138 mmol/L (ref 135–145)

## 2019-01-08 LAB — MAGNESIUM: Magnesium: 1.7 mg/dL (ref 1.7–2.4)

## 2019-01-08 LAB — GLUCOSE, CAPILLARY
Glucose-Capillary: 94 mg/dL (ref 70–99)
Glucose-Capillary: 75 mg/dL (ref 70–99)
Glucose-Capillary: 85 mg/dL (ref 70–99)
Glucose-Capillary: 78 mg/dL (ref 70–99)
Glucose-Capillary: 84 mg/dL (ref 70–99)

## 2019-01-08 LAB — CORTISOL-AM, BLOOD: Cortisol - AM: 8.3 ug/dL (ref 6.7–22.6)

## 2019-01-08 MED ORDER — MAGNESIUM SULFATE 2 GM/50ML IV SOLN
2.0000 g | Freq: Once | INTRAVENOUS | Status: AC
  Administered 2019-01-08: 2 g via INTRAVENOUS
  Filled 2019-01-08: qty 50

## 2019-01-08 MED ORDER — ONDANSETRON HCL 4 MG/2ML IJ SOLN
4.0000 mg | Freq: Three times a day (TID) | INTRAMUSCULAR | Status: DC
  Administered 2019-01-08 – 2019-01-12 (×11): 4 mg via INTRAVENOUS
  Filled 2019-01-08 (×11): qty 2

## 2019-01-08 MED ORDER — CHOLESTYRAMINE LIGHT 4 G PO PACK
4.0000 g | PACK | Freq: Two times a day (BID) | ORAL | Status: DC
  Administered 2019-01-09 – 2019-01-12 (×7): 4 g via ORAL
  Filled 2019-01-08 (×9): qty 1

## 2019-01-08 MED ORDER — DIPHENOXYLATE-ATROPINE 2.5-0.025 MG PO TABS
2.0000 | ORAL_TABLET | Freq: Three times a day (TID) | ORAL | Status: DC
  Administered 2019-01-09 – 2019-01-12 (×11): 2 via ORAL
  Filled 2019-01-08 (×11): qty 2

## 2019-01-08 NOTE — Plan of Care (Signed)
  Problem: Education: Goal: Knowledge of General Education information will improve Description Including pain rating scale, medication(s)/side effects and non-pharmacologic comfort measures Outcome: Progressing   

## 2019-01-08 NOTE — Progress Notes (Signed)
PT Cancellation Note  Patient Details Name: [name] MRN: [id number] DOB: 15-Apr-1945   Cancelled Treatment:    Reason Eval/Treat Not Completed: Other (comment).  Reports fatigue and abd cramping, nursing in to address with her but declines any PT today including bed ex.   Ramond Dial 01/08/2019, 3:29 PM   Mee Hives, PT MS Acute Rehab Dept. Number: [place] and [place]

## 2019-01-08 NOTE — Progress Notes (Signed)
Subjective: The patient continues to complain about diarrhea.  Objective: Vital signs in last 24 hours: Temp:  [98.4 F (36.9 C)-99.2 F (37.3 C)] 98.4 F (36.9 C) (11/24 1551) Pulse Rate:  [63-79] 72 (11/24 1551) Resp:  [16-17] 17 (11/24 1551) BP: (130-140)/(74-93) 140/75 (11/24 1551) SpO2:  [96 %-100 %] 99 % (11/24 1551) Last BM Date: 01/08/19  Intake/Output from previous day: 11/23 0701 - 11/24 0700 In: 1309.5 [P.O.:300; I.V.:756.9; IV Piggyback:252.6] Out: -  Intake/Output this shift: Total I/O In: 1448.3 [P.O.:480; I.V.:918.3; IV Piggyback:50] Out: -   General appearance: alert and no distress GI: soft, non-tender; bowel sounds normal; no masses,  no organomegaly  Lab Results: Recent Labs    01/06/19 0222 01/07/19 0456 01/08/19 0158  WBC 7.0 8.2 6.2  HGB 10.1* 9.5* 9.3*  HCT 30.4* 29.0* 28.1*  PLT 301 288 269   BMET Recent Labs    01/06/19 0933 01/07/19 1024 01/08/19 0743  NA 141 137 138  K 3.4* 3.0* 3.8  CL 119* 116* 116*  CO2 16* 14* 16*  GLUCOSE 104* 108* 92  BUN 5* <5* 5*  CREATININE 1.03* 0.90 0.91  CALCIUM 9.4 8.9 8.7*   LFT No results for input(s): PROT, ALBUMIN, AST, ALT, ALKPHOS, BILITOT, BILIDIR, IBILI in the last 72 hours. PT/INR No results for input(s): LABPROT, INR in the last 72 hours. Hepatitis Panel No results for input(s): HEPBSAG, HCVAB, HEPAIGM, HEPBIGM in the last 72 hours. C-Diff No results for input(s): CDIFFTOX in the last 72 hours. Fecal Lactopherrin No results for input(s): FECLLACTOFRN in the last 72 hours.  Studies/Results: No results found.  Medications:  Scheduled: . cholestyramine light  4 g Oral BID  . [START ON 01/09/2019] diphenoxylate-atropine  2 tablet Oral TID AC  . feeding supplement  1 Container Oral TID BM  . insulin aspart  0-9 Units Subcutaneous TID WC  . lipase/protease/amylase  72,000 Units Oral TID AC  . multivitamin with minerals  1 tablet Oral Daily  . nebivolol  2.5 mg Oral Daily  .  omeprazole  20 mg Oral Daily  . ondansetron (ZOFRAN) IV  4 mg Intravenous Q8H  . rivaroxaban  15 mg Oral Q supper   Continuous: . 0.9 % NaCl with KCl 20 mEq / L 100 mL/hr at 01/08/19 1138    Assessment/Plan: 1) Diarrhea. 2) Nausea.   The source of her diarrhea is unknown.  There is the possibility of EPI, but she does not notice any benefit with the increase in her Creon dose.  Reviewing prior notes from her earlier admissions, she did benefit with the use of cholestyramine and scheduled lomotil.  Octreotide was tried, but it was not effective.    Plan: 1) Continue Creon. 2) Scheduled Lomotil. 3) Start cholestyramine. 4) Check AM cortisol level.  LOS: 4 days   Mistie Adney D 01/08/2019, 5:06 PM

## 2019-01-08 NOTE — Progress Notes (Signed)
PROGRESS NOTE    [name]  IPJ:[id number] DOB: Aug 05, 1945 DOA: 01/04/2019 PCP: [name], MD   Brief Narrative: 73 year old with past medical history significant for PE on Xarelto, bilateral carotid stenosis, systemic lupus, asthma, pulmonary nodules, pancreatic insufficiency with chronic diarrhea who presents with worsening persistent diarrhea.  Patient report her 7-8 episodes of watery diarrhea despite taking Lomotil twice a day.  She is not able to keep herself hydrated.  She saw her gastroenterologist and she was a started on probiotic but this was discontinue, because this was causing nausea vomiting.  Patient received a short course of low-dose prednisone for a flareup of her inflammatory arthritis.  Patient has been having diarrhea since June, she has had extensive work-up with endoscopy, colonoscopy and evaluation for C diff. Diarrhea was thought to be related to pancreatic exocrine insufficiency. During this admission creon dose was increases, lomotil was increase to 2 tablets four times a day PRN.   Patient continue to have Diarrhea. Follow GI recommendation for today.    Assessment & Plan:   Active Problems:   Diabetes type 2, controlled ([place])   HTN (hypertension)   GERD (gastroesophageal reflux disease)   Diarrhea   Pulmonary embolism (HCC)   Inflammatory arthritis   Hyponatremia  1-Acute on Chronic Diarrhea;  Presumed related to pancreatic exocrine insufficiency Evaluated by GI, Creon increased to 72,000 units. Lomotil increase to 2 tablets 4 times a day as needed. Diarrhea persist.  Continue with IV fluids. C. Diff and GI pathogen during this admission negative. Patient report no BM from 5 am until 7 Pm last night. Since 7 PM she has had 6 watery BM. She wonders if prednisone could cause diarrhea. She received very dose of prednisone outpatient, I doubt it.  Lupus; it doesn't seem patient has protein losing enteropathy, no anasarca, edema, and usually  present in severe lupus with multi-organ failure. No clinical picture for mesenteric ischemia. Follow Dr Benson Norway evaluation and recommendation in this regards.    Hypokalemia; replaced.  Getting Kcl with IV fluids.  Repeat labs in am.   Metabolic Acidosis; related to diarrhea. IV bolus, IV fluids.  Persist, improved from yesterday.  Continue with IV fluids.   AKI;  Presents with cr at 1.5. Likely related to Hypovolemia relate to diarrhea.  Improved with IV fluids.   HTN; Continue with Bystolic. Hold ARB in setting of dehydration and diarrhea.  If BP increases consider resuming benicar lower dose.  Hyponatremia; resolved with IV fluids.   DM type 2; on diet controlled.   History of PE;  Was on heparin Gtt due to worsening AKI.  renal function improved, now back on Xarelto.   Systemic Lupus with inflammatory arthritis; Needs to follow up with Rheumatology.   Facial Rash; started on triamcinolone.     Nutrition Problem: Inadequate oral intake Etiology: acute illness    Signs/Symptoms: per patient/family report       Estimated body mass index is 23.33 kg/m as calculated from the following:   Height as of this encounter: 5' 0.5" (1.537 m).   Weight as of this encounter: 55.1 kg.   DVT prophylaxis: xarelto Code Status: Full  Family Communication: Care discussed with patient.  Disposition Plan: home when diarrhea improved, patient is able to maintain hydration with oral intake. She will need extra IV bolus fluids today.   Consultants:   GI  Procedures:   none  Antimicrobials:  none  Subjective: She report no BM since 5 am until 7 pm  yesterday.  She started to have diarrhea overnight, had more than 6 watery BM.    Objective: Vitals:   01/07/19 0956 01/07/19 1400 01/07/19 2224 01/08/19 0506  BP: 139/74 140/76 130/74 (!) 133/93  Pulse: 65 62 63 79  Resp: 16 17 17 16   Temp: 98.9 F (37.2 C) 98 F (36.7 C) 99.2 F (37.3 C) 99.2 F (37.3 C)  TempSrc:  Oral Oral Oral Oral  SpO2: 100% 100% 100% 96%  Weight:      Height:        Intake/Output Summary (Last 24 hours) at 01/08/2019 1018 Last data filed at 01/08/2019 0900 Gross per 24 hour  Intake 1189.45 ml  Output -  Net 1189.45 ml   Filed Weights   01/05/19 0200  Weight: 55.1 kg    Examination:  General exam: NAD Respiratory system: CTA Cardiovascular system: S 1, S 2 RRR Gastrointestinal system: BS present, soft, nt Central nervous system: Non focal  Extremities: Symmetric power.  Skin: No rashes    Data Reviewed: I have personally reviewed following labs and imaging studies  CBC: Recent Labs  Lab 01/04/19 1644 01/05/19 0722 01/06/19 0222 01/07/19 0456 01/08/19 0158  WBC 7.9 6.6 7.0 8.2 6.2  HGB 11.0* 10.6* 10.1* 9.5* 9.3*  HCT 33.6* 31.9* 30.4* 29.0* 28.1*  MCV 96.0 95.8 95.0 94.8 94.9  PLT 344 333 301 288 599   Basic Metabolic Panel: Recent Labs  Lab 01/04/19 1644 01/05/19 0722 01/06/19 0933 01/07/19 1024 01/08/19 0743  NA 129* 137 141 137 138  K 3.5 4.3 3.4* 3.0* 3.8  CL 101 112* 119* 116* 116*  CO2 18* 14* 16* 14* 16*  GLUCOSE 95 85 104* 108* 92  BUN 15 11 5* <5* 5*  CREATININE 1.55* 1.18* 1.03* 0.90 0.91  CALCIUM 8.9 8.7* 9.4 8.9 8.7*  MG  --   --   --  1.0* 1.7   GFR: Estimated Creatinine Clearance: 39.4 mL/min (by C-G formula based on SCr of 0.91 mg/dL). Liver Function Tests: Recent Labs  Lab 01/04/19 1644 01/05/19 0722  AST 17 15  ALT 21 18  ALKPHOS 81 71  BILITOT 1.0 0.9  PROT 6.9 6.2*  ALBUMIN 3.5 3.1*   Recent Labs  Lab 01/04/19 1644  LIPASE 17   No results for input(s): AMMONIA in the last 168 hours. Coagulation Profile: No results for input(s): INR, PROTIME in the last 168 hours. Cardiac Enzymes: No results for input(s): CKTOTAL, CKMB, CKMBINDEX, TROPONINI in the last 168 hours. BNP (last 3 results) No results for input(s): PROBNP in the last 8760 hours. HbA1C: No results for input(s): HGBA1C in the last 72 hours.  CBG: Recent Labs  Lab 01/07/19 0747 01/07/19 1220 01/07/19 1653 01/07/19 2126 01/08/19 0819  GLUCAP 83 91 102* 110* 94   Lipid Profile: No results for input(s): CHOL, HDL, LDLCALC, TRIG, CHOLHDL, LDLDIRECT in the last 72 hours. Thyroid Function Tests: No results for input(s): TSH, T4TOTAL, FREET4, T3FREE, THYROIDAB in the last 72 hours. Anemia Panel: No results for input(s): VITAMINB12, FOLATE, FERRITIN, TIBC, IRON, RETICCTPCT in the last 72 hours. Sepsis Labs: No results for input(s): PROCALCITON, LATICACIDVEN in the last 168 hours.  Recent Results (from the past 240 hour(s))  Gastrointestinal Panel by PCR , Stool     Status: None   Collection Time: 01/04/19  8:20 PM   Specimen: Stool  Result Value Ref Range Status   Campylobacter species NOT DETECTED NOT DETECTED Final   Plesimonas shigelloides NOT DETECTED NOT DETECTED Final  Salmonella species NOT DETECTED NOT DETECTED Final   Yersinia enterocolitica NOT DETECTED NOT DETECTED Final   Vibrio species NOT DETECTED NOT DETECTED Final   Vibrio cholerae NOT DETECTED NOT DETECTED Final   Enteroaggregative E coli (EAEC) NOT DETECTED NOT DETECTED Final   Enteropathogenic E coli (EPEC) NOT DETECTED NOT DETECTED Final   Enterotoxigenic E coli (ETEC) NOT DETECTED NOT DETECTED Final   Shiga like toxin producing E coli (STEC) NOT DETECTED NOT DETECTED Final   Shigella/Enteroinvasive E coli (EIEC) NOT DETECTED NOT DETECTED Final   Cryptosporidium NOT DETECTED NOT DETECTED Final   Cyclospora cayetanensis NOT DETECTED NOT DETECTED Final   Entamoeba histolytica NOT DETECTED NOT DETECTED Final   Giardia lamblia NOT DETECTED NOT DETECTED Final   Adenovirus F40/41 NOT DETECTED NOT DETECTED Final   Astrovirus NOT DETECTED NOT DETECTED Final   Norovirus GI/GII NOT DETECTED NOT DETECTED Final   Rotavirus A NOT DETECTED NOT DETECTED Final   Sapovirus (I, II, IV, and V) NOT DETECTED NOT DETECTED Final    Comment: Performed at [facility], [address] [place], [place] [address]  C Difficile Quick Screen w PCR reflex     Status: None   Collection Time: 01/04/19  8:20 PM   Specimen: Stool  Result Value Ref Range Status   C Diff antigen NEGATIVE NEGATIVE Final   C Diff toxin NEGATIVE NEGATIVE Final   C Diff interpretation No C. difficile detected.  Final    Comment: Performed at [facility], [place] [address] [place], [place] [address]  SARS CORONAVIRUS 2 (TAT 6-24 HRS) Nasopharyngeal Nasopharyngeal Swab     Status: None   Collection Time: 01/04/19  8:46 PM   Specimen: Nasopharyngeal Swab  Result Value Ref Range Status   SARS Coronavirus 2 NEGATIVE NEGATIVE Final    Comment: (NOTE) SARS-CoV-2 target nucleic acids are NOT DETECTED. The SARS-CoV-2 RNA is generally detectable in upper and lower respiratory specimens during the acute phase of infection. Negative results do not preclude SARS-CoV-2 infection, do not rule out co-infections with other pathogens, and should not be used as the sole basis for treatment or other patient management decisions. Negative results must be combined with clinical observations, patient history, and epidemiological information. The expected result is Negative. Fact Sheet for Patients: SugarRoll.be Fact Sheet for Healthcare Providers: https://www.woods-mathews.com/ This test is not yet approved or cleared by the [place] FDA and  has been authorized for detection and/or diagnosis of SARS-CoV-2 by FDA under an Emergency Use Authorization (EUA). This EUA will remain  in effect (meaning this test can be used) for the duration of the COVID-19 declaration under Section 56 4(b)(1) of the Act, 21 U.S.C. section 360bbb-3(b)(1), unless the authorization is terminated or revoked sooner. Performed at [facility], [place] [address] [place], [place] [address]          Radiology Studies: No results found.       Scheduled Meds: . feeding supplement  1 Container Oral TID BM  . insulin aspart  0-9 Units Subcutaneous TID WC  . lipase/protease/amylase  72,000 Units Oral TID AC  . multivitamin with minerals  1 tablet Oral Daily  . nebivolol  2.5 mg Oral Daily  . omeprazole  20 mg Oral Daily  . rivaroxaban  15 mg Oral Q supper   Continuous Infusions: . 0.9 % NaCl with KCl 20 mEq / L 100 mL/hr at 01/07/19 2252  . magnesium sulfate bolus IVPB  LOS: 4 days    Time spent: 35 minutes.     Elmarie Shiley, MD [facility]   If 7PM-7AM, please contact night-coverage [email or web address] Password [email or web address] 01/08/2019, 10:18 AM

## 2019-01-08 NOTE — Progress Notes (Signed)
Nutrition Follow-up  RD working remotely.  DOCUMENTATION CODES:   Not applicable  INTERVENTION:   -Continue Boost Breeze po TID, each supplement provides 250 kcal and 9 grams of protein -Continue MVI with minerals daily  NUTRITION DIAGNOSIS:   Inadequate oral intake related to acute illness as evidenced by per patient/family report.  Ongoing  GOAL:   Patient will meet greater than or equal to 90% of their needs  Progressing   MONITOR:   PO intake, Supplement acceptance, Labs, Weight trends, Skin, I & O's  REASON FOR ASSESSMENT:   Malnutrition Screening Tool    ASSESSMENT:   HPI: [name] is a 73 y.o. female with medical history significant of pulmonary embolism on Xarelto, bilateral carotid stenosis, systemic lupus, asthma, pulmonary nodules, pancreatic insufficiency in chronic diarrhea who presents with concerns of persistent diarrhea.  11/23- advanced to regular diet  Reviewed I/O's: +1.3 L x 24 hours and +4,6 L since admission  Per GI notes, pt with history of chronic diarrhea and pancreatic exocrine insufficiency. Pt continues to complain about constant diarrhea. Plan to start cholestyramine and scheduled lomotil in addition to increased creon doses.   Intake has improved since diet has been advanced to regular. Noted meal completion 25-100% (avergaing about 55-75% of meals). Will continue Boost Breeze supplements, as pt hesitant to try milky based products.   Medications reviewed and include creon and 0.9% NaCl with KCl 20 mEq/L infusion @ 100 ml/hr.   Labs reviewed: CBGS: 75-94 (inpatient orders for glycemic control are 0-9 units insulin aspart TID with meals).   Diet Order:   Diet Order            Diet regular Room service appropriate? Yes; Fluid consistency: Thin  Diet effective now              EDUCATION NEEDS:   No education needs have been identified at this time  Skin:  Skin Assessment: Reviewed RN Assessment  Last BM:   01/08/19  Height:   Ht Readings from Last 1 Encounters:  01/05/19 5' 0.5" (1.537 m)    Weight:   Wt Readings from Last 1 Encounters:  01/05/19 55.1 kg    Ideal Body Weight:  45.4 kg  BMI:  Body mass index is 23.33 kg/m.  Estimated Nutritional Needs:   Kcal:  1650-1850  Protein:  75-90 grams  Fluid:  > 1.6 L    Lorrane Mccay A. Jimmye Norman, RD, LDN, [place] Registered Dietitian II Certified Diabetes Care and Education Specialist Pager: [phone] After hours Pager: [phone]

## 2019-01-09 LAB — CBC
HCT: 28.2 % — ABNORMAL LOW (ref 36.0–46.0)
Hemoglobin: 9.2 g/dL — ABNORMAL LOW (ref 12.0–15.0)
MCH: 31 pg (ref 26.0–34.0)
MCHC: 32.6 g/dL (ref 30.0–36.0)
MCV: 94.9 fL (ref 80.0–100.0)
Platelets: 267 10*3/uL (ref 150–400)
RBC: 2.97 MIL/uL — ABNORMAL LOW (ref 3.87–5.11)
RDW: 13.6 % (ref 11.5–15.5)
WBC: 7.1 10*3/uL (ref 4.0–10.5)
nRBC: 0 % (ref 0.0–0.2)

## 2019-01-09 LAB — BASIC METABOLIC PANEL
Anion gap: 7 (ref 5–15)
BUN: 5 mg/dL — ABNORMAL LOW (ref 8–23)
CO2: 17 mmol/L — ABNORMAL LOW (ref 22–32)
Calcium: 8.3 mg/dL — ABNORMAL LOW (ref 8.9–10.3)
Chloride: 114 mmol/L — ABNORMAL HIGH (ref 98–111)
Creatinine, Ser: 0.84 mg/dL (ref 0.44–1.00)
GFR calc Af Amer: >60 mL/min (ref >60)
GFR calc non Af Amer: >60 mL/min (ref >60)
Glucose, Bld: 96 mg/dL (ref 70–99)
Potassium: 4.1 mmol/L (ref 3.5–5.1)
Sodium: 138 mmol/L (ref 135–145)

## 2019-01-09 LAB — GLUCOSE, CAPILLARY
Glucose-Capillary: 81 mg/dL (ref 70–99)
Glucose-Capillary: 82 mg/dL (ref 70–99)
Glucose-Capillary: 77 mg/dL (ref 70–99)
Glucose-Capillary: 91 mg/dL (ref 70–99)

## 2019-01-09 LAB — MAGNESIUM: Magnesium: 2 mg/dL (ref 1.7–2.4)

## 2019-01-09 MED ORDER — DOUBLE ANTIBIOTIC 500-10000 UNIT/GM EX OINT
1.0000 "application " | TOPICAL_OINTMENT | Freq: Two times a day (BID) | CUTANEOUS | Status: DC
  Administered 2019-01-09 – 2019-01-12 (×6): 1 via TOPICAL
  Filled 2019-01-09: qty 28.4

## 2019-01-09 MED ORDER — GLUCOSE 40 % PO GEL
ORAL | Status: AC
  Filled 2019-01-09: qty 1

## 2019-01-09 MED ORDER — GLUCOSE 4 G PO CHEW: 1.0000 | CHEWABLE_TABLET | Freq: Once | ORAL | Status: DC | PRN

## 2019-01-09 NOTE — Progress Notes (Signed)
Occupational Therapy Treatment Patient Details Name: [name] MRN: [id number] DOB: 1945/12/03 Today's Date: 01/09/2019    History of present illness 73 y.o. female with medical history significant of pulmonary embolism on Xarelto, bilateral carotid stenosis, systemic lupus, asthma, pulmonary nodules, pancreatic insufficiency in chronic diarrhea who presents with concerns of persistent diarrhea. Pt with significant pain from Lupus   OT comments  Pt making steady progress towards OT goals this session. Session focus on functional mobility, UB/ LB ADLs. Pt reports difficulty reaching to feet from sitting, demo'ed use of AE with pt stating that would still hurt her shoulder. Pt reports decreased pain in BUE this AM more agreeable to session. Pt complete LB dressing by accessing feet via figure four from supine, Pt complete UB dressing with MIN A from standing. Pt complete functional mobility with RW in hallway with supervision. Pt reports wanting shower seat but states she would purchase one on her own. DC plan remains appropriate, will continue to follow acutely per POC.    Follow Up Recommendations  No OT follow up;Other (comment)(could benefitf rom an aide if continued difficulty with ADLs)    Equipment Recommendations  Other (comment)(reacher, LH bath sponge, sock aid)    Recommendations for Other Services      Precautions / Restrictions Precautions Precautions: Fall Restrictions Weight Bearing Restrictions: No       Mobility Bed Mobility Overal bed mobility: Needs Assistance Bed Mobility: Supine to Sit;Sit to Supine     Supine to sit: Modified independent (Device/Increase time);HOB elevated Sit to supine: Modified independent (Device/Increase time);HOB elevated   General bed mobility comments: elevated HOB, no physical assist needed  Transfers Overall transfer level: Needs assistance Equipment used: Rolling walker (2 wheeled) Transfers: Sit to/from Stand Sit to  Stand: Supervision         General transfer comment: supervision for safety    Balance Overall balance assessment: Needs assistance Sitting-balance support: No upper extremity supported;Feet supported Sitting balance-Leahy Scale: Good     Standing balance support: During functional activity;No upper extremity supported Standing balance-Leahy Scale: Good Standing balance comment: able to reach out of BOS with no LOB                           ADL either performed or assessed with clinical judgement   ADL Overall ADL's : Needs assistance/impaired                 Upper Body Dressing : Minimal assistance;Standing   Lower Body Dressing: Set up;Bed level Lower Body Dressing Details (indicate cue type and reason): able to pull legs up to chest to don socks in bed Toilet Transfer: Supervision/safety;RW;Ambulation Toilet Transfer Details (indicate cue type and reason): simulated via funcitonal mobility; supervision for safety, very good safety awareness       Tub/Shower Transfer Details (indicate cue type and reason): reports wanting to get her own shower chair for home, provided education on where to purchase Functional mobility during ADLs: Supervision/safety;Rolling walker General ADL Comments: pt reports decreased pain in BUEs able to complete functional mobility with RW UB/ LB ADLs with supervision     Vision Baseline Vision/History: Wears glasses Wears Glasses: At all times Patient Visual Report: No change from baseline     Perception     Praxis      Cognition Arousal/Alertness: Awake/alert Behavior During Therapy: WFL for tasks assessed/performed Overall Cognitive Status: Within Functional Limits for tasks assessed  General Comments: a little particular about how she likes things        Exercises     Shoulder Instructions       General Comments      Pertinent Vitals/ Pain       Pain  Assessment: No/denies pain  Home Living                                          Prior Functioning/Environment              Frequency  Min 2X/week        Progress Toward Goals  OT Goals(current goals can now be found in the care plan section)  Progress towards OT goals: Progressing toward goals  Acute Rehab OT Goals Patient Stated Goal: To maintain UE strength and mobility OT Goal Formulation: With patient Time For Goal Achievement: 01/21/19 Potential to Achieve Goals: Good  Plan Discharge plan remains appropriate    Co-evaluation                 AM-PAC OT "6 Clicks" Daily Activity     Outcome Measure   Help from another person eating meals?: None Help from another person taking care of personal grooming?: A Little Help from another person toileting, which includes using toliet, bedpan, or urinal?: A Little Help from another person bathing (including washing, rinsing, drying)?: A Little Help from another person to put on and taking off regular upper body clothing?: A Little Help from another person to put on and taking off regular lower body clothing?: None 6 Click Score: 20    End of Session Equipment Utilized During Treatment: Rolling walker  OT Visit Diagnosis: Muscle weakness (generalized) (M62.81);Pain;Other symptoms and signs involving the nervous system (R29.898) Pain - part of body: Shoulder;Arm   Activity Tolerance Patient tolerated treatment well   Patient Left in bed;with call bell/phone within reach   Nurse Communication          Time: 3374-4514 OT Time Calculation (min): 30 min  Charges: OT General Charges $OT Visit: 1 Visit OT Treatments $Self Care/Home Management : 23-37 mins   Lanier Clam., COTA/L Acute Rehabilitation Services [phone] [place] 01/09/2019, 9:32 AM

## 2019-01-09 NOTE — Progress Notes (Signed)
PROGRESS NOTE  [name]  DOB: 1946-01-27  PCP: [name], MD OMV:[id number]  DOA: 01/04/2019  LOS: 5 days   Chief Complaint  Patient presents with  . Diarrhea   Brief narrative: 73 year old with past medical history significant for PE on Xarelto, bilateral carotid stenosis, systemic lupus, asthma, pulmonary nodules, pancreatic insufficiency with chronic diarrhea who presented to the ED on 11/20 with worsening persistent diarrhea.  Patient reported 7-8 episodes of watery diarrhea despite taking Lomotil twice a day. She was not able to keep herself hydrated.  She saw her gastroenterologist and was a started on probiotic but it gave her nausea and vomiting and hence stopped. Patient also received a short course of low-dose prednisone for a flareup of her inflammatory arthritis.  Patient has been having diarrhea since June, she has had extensive work-up with endoscopy, colonoscopy and evaluation for C diff. Diarrhea was thought to be related to pancreatic exocrine insufficiency. During this admission creon dose was increased, lomotil was increased to 2 tablets four times a day PRN.   Subjective: Patient was seen and examined this morning.  Pleasant elderly Caucasian female.  Lying down in bed.  Reports she had a loose bowel movement yesterday at 7:25 PM and again this morning at 5:30 PM no other episode of bowel movement in the night.  Patient states that her bowel frequency is slowing down but every time she goes she gets liquidy diarrhea.  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 Lomotil increased to 2 tablets 4 times a day as needed. Diarrhea persists. Continue with IV fluids. C. Diff and GI pathogen during this admission negative. -Reports she had a loose bowel movement yesterday at 7:25 PM and again this morning at 5:30 PM no other episode of bowel movement in the  night.  Patient states that her bowel frequency is slowing down but every time she goes she gets liquidy diarrhea.  Hypokalemia; replaced.  Getting Kcl with IV fluids.  Repeat labs in am.   Metabolic Acidosis; related to diarrhea. IV bolus, IV fluids.  Persist, improved from yesterday.  Continue with IV fluids.   AKI;  Presents with cr at 1.5. Likely related to Hypovolemia relate to diarrhea.  Improved with IV fluids.   HTN; Continue with Bystolic. Hold ARB in setting of dehydration and diarrhea.  If BP increases consider resuming benicar lower dose.  Hyponatremia; resolved with IV fluids.   DM type 2; on diet controlled.   History of PE;  Was on heparin Gtt due to worsening AKI.  renal function improved, now back on Xarelto.   Systemic Lupus with inflammatory arthritis; Needs to follow up with Rheumatology.   Facial Rash; started on triamcinolone.   Mobility: Increase ambulation Diet: Regular diet Fluid: Normal saline at 100 mL/h DVT prophylaxis:  Xarelto Code Status:   Code Status: Full Code  Family Communication:  Not at bedside Expected Discharge:  Likely home in 1 to 2 days.  Consultants:    Procedures:    Antimicrobials: Anti-infectives (From admission, onward)   None      Diet Order            Diet regular Room service appropriate? Yes; Fluid consistency: Thin  Diet effective now              Infusions:  . 0.9 % NaCl with KCl 20 mEq / L 100 mL/hr at 01/09/19 0700  Scheduled Meds: . cholestyramine light  4 g Oral BID  . diphenoxylate-atropine  2 tablet Oral TID AC  . feeding supplement  1 Container Oral TID BM  . insulin aspart  0-9 Units Subcutaneous TID WC  . lipase/protease/amylase  72,000 Units Oral TID AC  . multivitamin with minerals  1 tablet Oral Daily  . nebivolol  2.5 mg Oral Daily  . omeprazole  20 mg Oral Daily  . ondansetron (ZOFRAN) IV  4 mg Intravenous Q8H  . rivaroxaban  15 mg Oral Q supper    PRN meds:  triamcinolone cream   Objective: Vitals:   01/08/19 1955 01/09/19 0610  BP: 133/66 138/73  Pulse: 70 70  Resp: 16 16  Temp: 98.4 F (36.9 C) 97.9 F (36.6 C)  SpO2: 99% 100%    Intake/Output Summary (Last 24 hours) at 01/09/2019 1355 Last data filed at 01/09/2019 0900 Gross per 24 hour  Intake 1628.33 ml  Output 1 ml  Net 1627.33 ml   Filed Weights   01/05/19 0200  Weight: 55.1 kg   Weight change:  Body mass index is 23.33 kg/m.   Physical Exam: General exam: Appears calm and comfortable.  Skin: No rashes, lesions or ulcers. HEENT: Atraumatic, normocephalic, supple neck, no obvious bleeding Lungs: Clear to auscultation bilaterally CVS: Regular rate and rhythm, no murmur GI/Abd soft, nondistended, mild diffuse tenderness, bowel sound present CNS: Alert, awake, oriented x3 Psychiatry: Mood appropriate Extremities: No pedal edema, no calf tenderness  Data Review: I have personally reviewed the laboratory data and studies available.  Recent Labs  Lab 01/05/19 0722 01/06/19 0222 01/07/19 0456 01/08/19 0158 01/09/19 0135  WBC 6.6 7.0 8.2 6.2 7.1  HGB 10.6* 10.1* 9.5* 9.3* 9.2*  HCT 31.9* 30.4* 29.0* 28.1* 28.2*  MCV 95.8 95.0 94.8 94.9 94.9  PLT 333 301 288 269 267   Recent Labs  Lab 01/05/19 0722 01/06/19 0933 01/07/19 1024 01/08/19 0743 01/09/19 0135  NA 137 141 137 138 138  K 4.3 3.4* 3.0* 3.8 4.1  CL 112* 119* 116* 116* 114*  CO2 14* 16* 14* 16* 17*  GLUCOSE 85 104* 108* 92 96  BUN 11 5* <5* 5* 5*  CREATININE 1.18* 1.03* 0.90 0.91 0.84  CALCIUM 8.7* 9.4 8.9 8.7* 8.3*  MG  --   --  1.0* 1.7 2.0    Terrilee Croak, MD  [facility] 01/09/2019

## 2019-01-09 NOTE — Plan of Care (Signed)
?  Problem: Clinical Measurements: ?Goal: Will remain free from infection ?Outcome: Progressing ?  ?

## 2019-01-09 NOTE — Progress Notes (Signed)
Physical Therapy Treatment Patient Details Name: [name] MRN: [id number] DOB: Mar 14, 1945 Today's Date: 01/09/2019    History of Present Illness Pt is a 73 y.o. female with medical history significant of pulmonary embolism on Xarelto, bilateral carotid stenosis, systemic lupus, asthma, pulmonary nodules, pancreatic insufficiency in chronic diarrhea who presents with concerns of persistent diarrhea. Pt with significant pain from Lupus    PT Comments    Pt making excellent progress with functional mobility and tolerated ambulating in hallway without an AD or UE supports. Pt with no LOB or need for physical assistance throughout. PT will continue to follow pt acutely to progress mobility as tolerated and to ensure a safe d/c home.    Follow Up Recommendations  No PT follow up     Equipment Recommendations  None recommended by PT    Recommendations for Other Services       Precautions / Restrictions Precautions Precautions: Fall Restrictions Weight Bearing Restrictions: No    Mobility  Bed Mobility Overal bed mobility: Modified Independent Bed Mobility: Supine to Sit;Sit to Supine     Supine to sit: Modified independent (Device/Increase time);HOB elevated Sit to supine: Modified independent (Device/Increase time);HOB elevated   General bed mobility comments: elevated HOB, no physical assist needed  Transfers Overall transfer level: Needs assistance Equipment used: None Transfers: Sit to/from Stand Sit to Stand: Supervision         General transfer comment: supervision for safety  Ambulation/Gait Ambulation/Gait assistance: Supervision Gait Distance (Feet): 500 Feet Assistive device: None Gait Pattern/deviations: Step-through pattern;Decreased stride length Gait velocity: decreased   General Gait Details: pt overall steady without an AD or UE supports navigating around her room and in hallway. No LOB or need for physical assistance, supervision for  safety   Stairs             Wheelchair Mobility    Modified Rankin (Stroke Patients Only)       Balance Overall balance assessment: Needs assistance Sitting-balance support: No upper extremity supported;Feet supported Sitting balance-Leahy Scale: Good     Standing balance support: During functional activity;No upper extremity supported Standing balance-Leahy Scale: Good Standing balance comment: able to reach out of BOS with no LOB                            Cognition Arousal/Alertness: Awake/alert Behavior During Therapy: WFL for tasks assessed/performed Overall Cognitive Status: Within Functional Limits for tasks assessed                                 General Comments: a little particular about how she likes things      Exercises      General Comments        Pertinent Vitals/Pain Pain Assessment: Faces Faces Pain Scale: Hurts a little bit Pain Location: bilateral hands Pain Descriptors / Indicators: Sore Pain Intervention(s): Monitored during session    Home Living                      Prior Function            PT Goals (current goals can now be found in the care plan section) Acute Rehab PT Goals Patient Stated Goal: To maintain UE strength and mobility PT Goal Formulation: With patient Time For Goal Achievement: 01/20/19 Potential to Achieve Goals: Good Progress towards PT goals: Progressing toward goals  Frequency    Min 2X/week      PT Plan Current plan remains appropriate    Co-evaluation              AM-PAC PT "6 Clicks" Mobility   Outcome Measure  Help needed turning from your back to your side while in a flat bed without using bedrails?: None Help needed moving from lying on your back to sitting on the side of a flat bed without using bedrails?: None Help needed moving to and from a bed to a chair (including a wheelchair)?: None Help needed standing up from a chair using your arms  (e.g., wheelchair or bedside chair)?: None Help needed to walk in hospital room?: None Help needed climbing 3-5 steps with a railing? : A Little 6 Click Score: 23    End of Session   Activity Tolerance: Patient tolerated treatment well Patient left: in bed;with call bell/phone within reach;Other (comment)(RN present) Nurse Communication: Mobility status PT Visit Diagnosis: Other abnormalities of gait and mobility (R26.89)     Time: 2111-7356 PT Time Calculation (min) (ACUTE ONLY): 13 min  Charges:  $Gait Training: 8-22 mins                     Anastasio Champion, DPT  Acute Rehabilitation Services Pager [phone] Office [place] 01/09/2019, 12:51 PM

## 2019-01-09 NOTE — Progress Notes (Addendum)
Subjective: Patient has had 3 loose BM's this morning and one small volume BM a few minutes ago where she feels the stools may be firming up a little. She had 12 BM's on Monday, 8 yesterday and 4 today. She feels nauseated all the time and therefore she is not enjoying her meals much. Her next dose of Cholestyramine is scheduled at 10 PM tonite.  Subjective: Vital signs in last 24 hours: Temp:  [97.9 F (36.6 C)-98.4 F (36.9 C)] 97.9 F (36.6 C) (11/25 0610) Pulse Rate:  [70-72] 70 (11/25 0610) Resp:  [16-17] 16 (11/25 0610) BP: (133-140)/(66-75) 138/73 (11/25 0610) SpO2:  [99 %-100 %] 100 % (11/25 0610) Last BM Date: 01/08/19  Intake/Output from previous day: 11/24 0701 - 11/25 0700 In: 1448.3 [P.O.:480; I.V.:918.3; IV Piggyback:50] Out: 1 [Stool:1] Intake/Output this shift: Total I/O In: 300 [P.O.:300] Out: -   General appearance: alert, cooperative, mild distress and pale Resp: clear to auscultation bilaterally Cardio: regular rate and rhythm, S1, S2 normal, no murmur, click, rub or gallop GI: soft, non-tender; bowel sounds hyperactive; no masses,  no organomegaly Extremities: extremities normal, atraumatic, no cyanosis or edema  Lab Results: Recent Labs    01/07/19 0456 01/08/19 0158 01/09/19 0135  WBC 8.2 6.2 7.1  HGB 9.5* 9.3* 9.2*  HCT 29.0* 28.1* 28.2*  PLT 288 269 267   BMET Recent Labs    01/07/19 1024 01/08/19 0743 01/09/19 0135  NA 137 138 138  K 3.0* 3.8 4.1  CL 116* 116* 114*  CO2 14* 16* 17*  GLUCOSE 108* 92 96  BUN <5* 5* 5*  CREATININE 0.90 0.91 0.84  CALCIUM 8.9 8.7* 8.3*   Studies/Results: No results found.  Medications: I have reviewed the patient's current medications.  Assessment/Plan: 1) Severe diarrhea-etiology still not clear-seems to have slowed down a little. Patient feels the Creon is making her very nauseated. She took one dose of Cholestyramine this morning.  2) GERD: On PPI's. 3) HTN-ABR's on hold. 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 5) History of PE: On Xarelto. 6) SLE. 7) Anemia of chronic disease. 8) Anxiety disorder. 9) Lung nodules on CT scan. 10) Small vessel cerebrovascular disease .  LOS: 5 days   [name] 01/09/2019, 11:58 AM

## 2019-01-10 LAB — CBC
HCT: 25.8 % — ABNORMAL LOW (ref 36.0–46.0)
Hemoglobin: 8.5 g/dL — ABNORMAL LOW (ref 12.0–15.0)
MCH: 31.1 pg (ref 26.0–34.0)
MCHC: 32.9 g/dL (ref 30.0–36.0)
MCV: 94.5 fL (ref 80.0–100.0)
Platelets: 262 10*3/uL (ref 150–400)
RBC: 2.73 MIL/uL — ABNORMAL LOW (ref 3.87–5.11)
RDW: 13.7 % (ref 11.5–15.5)
WBC: 7.7 10*3/uL (ref 4.0–10.5)
nRBC: 0 % (ref 0.0–0.2)

## 2019-01-10 LAB — GLUCOSE, CAPILLARY
Glucose-Capillary: 82 mg/dL (ref 70–99)
Glucose-Capillary: 89 mg/dL (ref 70–99)
Glucose-Capillary: 86 mg/dL (ref 70–99)

## 2019-01-10 NOTE — Progress Notes (Signed)
Subjective: Since I last evaluated the patient, she seems to be doing much better.  She has had 1 bowel movement early in the morning today at about 5 AM.  She is still concerned because the stool was loose and have no form to them.  She is afraid of going home in case she has recurrence of her symptoms and becomes dehydrated again.  She denies having any abdominal pain nausea vomiting she is tolerating his diet well.  Does not feel ready to go home at this time.   Objective: Vital signs in last 24 hours: Temp:  [97.8 F (36.6 C)-98.8 F (37.1 C)] 98.7 F (37.1 C) (11/26 0557) Pulse Rate:  [70-76] 71 (11/26 0557) Resp:  [16-17] 17 (11/25 2019) BP: (129-133)/(62-67) 129/62 (11/26 0557) SpO2:  [97 %-98 %] 97 % (11/26 0557) Last BM Date: 01/09/19  Intake/Output from previous day: 11/25 0701 - 11/26 0700 In: 450 [P.O.:450] Out: -  Intake/Output this shift: No intake/output data recorded.  General appearance: alert, cooperative, appears stated age, no distress and pale Resp: clear to auscultation bilaterally Cardio: regular rate and rhythm, S1, S2 normal, no murmur, click, rub or gallop GI: soft, non-tender; bowel sounds normal; no masses,  no organomegaly Extremities: extremities normal, atraumatic, no cyanosis or edema  Lab Results: Recent Labs    01/08/19 0158 01/09/19 0135 01/10/19 0216  WBC 6.2 7.1 7.7  HGB 9.3* 9.2* 8.5*  HCT 28.1* 28.2* 25.8*  PLT 269 267 262   BMET Recent Labs    01/07/19 1024 01/08/19 0743 01/09/19 0135  NA 137 138 138  K 3.0* 3.8 4.1  CL 116* 116* 114*  CO2 14* 16* 17*  GLUCOSE 108* 92 96  BUN <5* 5* 5*  CREATININE 0.90 0.91 0.84  CALCIUM 8.9 8.7* 8.3*   Medications: I have reviewed the patient's current medications.  Assessment/Plan: 1) Severe diarrhea-etiology still not clear-seems to have slowed down quite a bit today. Patient feels the Cholestyramine is helping.  2) GERD: On PPI's. 3) HTN-ABR's on hold. 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 5)  History of PE: On Xarelto. 6) SLE. 7) Anemia of chronic disease. 8) Anxiety disorder. 9) Lung nodules on CT scan. 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

## 2019-01-10 NOTE — Progress Notes (Signed)
PROGRESS NOTE  [name]  DOB: 12-14-1945  PCP: [name], MD VOJ:[id number]  DOA: 01/04/2019  LOS: 6 days   Chief Complaint  Patient presents with  . Diarrhea   Brief narrative: 73 year old with past medical history significant for PE on Xarelto, bilateral carotid stenosis, systemic lupus, asthma, pulmonary nodules, pancreatic insufficiency with chronic diarrhea who presented to the ED on 11/20 with worsening persistent diarrhea.  Patient reported 7-8 episodes of watery diarrhea despite taking Lomotil twice a day. She was not able to keep herself hydrated.  She saw her gastroenterologist and was a started on probiotic but it gave her nausea and vomiting and hence stopped. Patient also received a short course of low-dose prednisone for a flareup of her inflammatory arthritis.  Patient has been having diarrhea since June, she has had extensive work-up with endoscopy, colonoscopy and evaluation for C diff. Diarrhea was thought to be related to pancreatic exocrine insufficiency. During this admission creon dose was increased, lomotil was increased to 2 tablets four times a day PRN.   Subjective: Patient was seen and examined this morning.  Pleasant elderly Caucasian female.   Reports 5 episodes of diarrhea yesterday.  None last night. One firm BM this am. She had mild bruising on the medial aspect of the right distal foot after bumping on her bed yesterday  Assessment/Plan: Acute on Chronic Diarrhea;  Presumed related to pancreatic exocrine insufficiency Evaluated by GI, Creon increased to 72,000 units. Lomotil increased to 2 tablets 4 times a day as needed. Diarrhea persists. Continue with IV fluids. C. Diff and GI pathogen during this admission negative. Overall diarrhea seems to be slowing down.  Patient was started on Questran yesterday.  Continue the same.  Hypokalemia; replaced.  Recheck in a.m.  Metabolic Acidosis; related to diarrhea. IV bolus, IV fluids.    AKI Presents with cr at 1.5. Likely related to Hypovolemia related to diarrhea.  Improved with IV fluids.   HTN - Continue with Bystolic. Hold ARB in setting of dehydration and diarrhea.  If BP increases consider resuming benicar lower dose.  Hyponatremia; resolved with IV fluids.   DM type 2; on diet controlled.   History of PE;  Was on heparin Gtt due to worsening AKI.  renal function improved, now back on Xarelto.   Systemic Lupus with inflammatory arthritis; Needs to follow up with Rheumatology.   Facial Rash; started on triamcinolone.   Mobility: Increase ambulation Diet: Regular diet Fluid: Normal saline at 100 mL/h DVT prophylaxis:  Xarelto Code Status:   Code Status: Full Code  Family Communication:  Not at bedside Expected Discharge:  Likely home in 1 to 2 days.  Consultants:    Procedures:    Antimicrobials: Anti-infectives (From admission, onward)   None      Diet Order            Diet regular Room service appropriate? Yes; Fluid consistency: Thin  Diet effective now              Infusions:  . 0.9 % NaCl with KCl 20 mEq / L 100 mL/hr at 01/09/19 1644    Scheduled Meds: . cholestyramine light  4 g Oral BID  . diphenoxylate-atropine  2 tablet Oral TID AC  . feeding supplement  1 Container Oral TID BM  . insulin aspart  0-9 Units Subcutaneous TID WC  . lipase/protease/amylase  72,000 Units Oral TID AC  . multivitamin with minerals  1 tablet Oral Daily  . nebivolol  2.5 mg Oral Daily  . omeprazole  20 mg Oral Daily  . ondansetron (ZOFRAN) IV  4 mg Intravenous Q8H  . polymixin-bacitracin  1 application Topical BID  . rivaroxaban  15 mg Oral Q supper    PRN meds: glucose, triamcinolone cream   Objective: Vitals:   01/09/19 2019 01/10/19 0557  BP: 132/67 129/62  Pulse: 76 71  Resp: 17   Temp: 98.8 F (37.1 C) 98.7 F (37.1 C)  SpO2: 97% 97%   No intake or output data in the 24 hours ending 01/10/19 1323 Filed Weights    01/05/19 0200  Weight: 55.1 kg   Weight change:  Body mass index is 23.33 kg/m.   Physical Exam: General exam: Appears calm and comfortable.  Skin: No rashes, lesions or ulcers. HEENT: Atraumatic, normocephalic, supple neck, no obvious bleeding Lungs: Clear to auscultation bilaterally CVS: Regular rate and rhythm, no murmur GI/Abd soft, nondistended, mild diffuse tenderness, bowel sound present CNS: Alert, awake, oriented x3 Psychiatry: Mood appropriate Extremities: No pedal edema, no calf tenderness.  Mild bruising on the medial aspect of the right distal foot after bumping on her bed yesterday  Data Review: I have personally reviewed the laboratory data and studies available.  Recent Labs  Lab 01/06/19 0222 01/07/19 0456 01/08/19 0158 01/09/19 0135 01/10/19 0216  WBC 7.0 8.2 6.2 7.1 7.7  HGB 10.1* 9.5* 9.3* 9.2* 8.5*  HCT 30.4* 29.0* 28.1* 28.2* 25.8*  MCV 95.0 94.8 94.9 94.9 94.5  PLT 301 288 269 267 262   Recent Labs  Lab 01/05/19 0722 01/06/19 0933 01/07/19 1024 01/08/19 0743 01/09/19 0135  NA 137 141 137 138 138  K 4.3 3.4* 3.0* 3.8 4.1  CL 112* 119* 116* 116* 114*  CO2 14* 16* 14* 16* 17*  GLUCOSE 85 104* 108* 92 96  BUN 11 5* <5* 5* 5*  CREATININE 1.18* 1.03* 0.90 0.91 0.84  CALCIUM 8.7* 9.4 8.9 8.7* 8.3*  MG  --   --  1.0* 1.7 2.0    Terrilee Croak, MD  [facility] 01/10/2019

## 2019-01-11 LAB — BASIC METABOLIC PANEL
Anion gap: 9 (ref 5–15)
BUN: <5 mg/dL — ABNORMAL LOW (ref 8–23)
CO2: 20 mmol/L — ABNORMAL LOW (ref 22–32)
Calcium: 8.8 mg/dL — ABNORMAL LOW (ref 8.9–10.3)
Chloride: 112 mmol/L — ABNORMAL HIGH (ref 98–111)
Creatinine, Ser: 0.86 mg/dL (ref 0.44–1.00)
GFR calc Af Amer: >60 mL/min (ref >60)
GFR calc non Af Amer: >60 mL/min (ref >60)
Glucose, Bld: 90 mg/dL (ref 70–99)
Potassium: 4.5 mmol/L (ref 3.5–5.1)
Sodium: 141 mmol/L (ref 135–145)

## 2019-01-11 LAB — CBC
HCT: 27 % — ABNORMAL LOW (ref 36.0–46.0)
Hemoglobin: 8.8 g/dL — ABNORMAL LOW (ref 12.0–15.0)
MCH: 31.2 pg (ref 26.0–34.0)
MCHC: 32.6 g/dL (ref 30.0–36.0)
MCV: 95.7 fL (ref 80.0–100.0)
Platelets: 292 10*3/uL (ref 150–400)
RBC: 2.82 MIL/uL — ABNORMAL LOW (ref 3.87–5.11)
RDW: 13.5 % (ref 11.5–15.5)
WBC: 6.1 10*3/uL (ref 4.0–10.5)
nRBC: 0 % (ref 0.0–0.2)

## 2019-01-11 LAB — GLUCOSE, CAPILLARY
Glucose-Capillary: 91 mg/dL (ref 70–99)
Glucose-Capillary: 86 mg/dL (ref 70–99)
Glucose-Capillary: 84 mg/dL (ref 70–99)
Glucose-Capillary: 98 mg/dL (ref 70–99)
Glucose-Capillary: 105 mg/dL — ABNORMAL HIGH (ref 70–99)

## 2019-01-11 NOTE — Progress Notes (Signed)
PROGRESS NOTE  [name]  DOB: 1945-11-09  PCP: [name], MD GDJ:[id number]  DOA: 01/04/2019  LOS: 7 days   Chief Complaint  Patient presents with  . Diarrhea   Brief narrative: 73 year old with past medical history significant for PE on Xarelto, bilateral carotid stenosis, systemic lupus, asthma, pulmonary nodules, pancreatic insufficiency with chronic diarrhea who presented to the ED on 11/20 with worsening persistent diarrhea.  Patient reported 7-8 episodes of watery diarrhea despite taking Lomotil twice a day. She was not able to keep herself hydrated.  She saw her gastroenterologist and was a started on probiotic but it gave her nausea and vomiting and hence stopped. Patient also received a short course of low-dose prednisone for a flareup of her inflammatory arthritis.  Patient has been having diarrhea since June, she has had extensive work-up with endoscopy, colonoscopy and evaluation for C diff. Diarrhea was thought to be related to pancreatic exocrine insufficiency. During this admission creon dose was increased, lomotil was increased to 2 tablets four times a day PRN.   Subjective: Patient was seen and examined this morning.   Last 24 hours patient had 3 bowel movements, last one this morning was liquidy, others were stopped.   Patient continues to repeat the same concern that, her diarrhea may return back once he is discharged to home.  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 Lomotil increased to 2 tablets 4 times a day as needed. Also started on Questran. C. Diff and GI pathogen during this admission negative. Overall diarrhea seems to be slowing down.  Patient continues to repeat the same concern that, her diarrhea may return back once he is discharged to home. -We will discharge the patient once okay with GI.  Hypokalemia;  normal today at 4.5.   Metabolic Acidosis; related to diarrhea. IV bolus, IV fluids.  CO2 level 20 today.  AKI Presents with cr at 1.5. Likely related to Hypovolemia related to diarrhea.  Improved with IV fluids. Creatinine at 0.86 today.  HTN - Continue with Bystolic. Hold ARB in setting of dehydration and diarrhea.  If BP increases consider resuming benicar lower dose.  Hyponatremia; resolved with IV fluids.   DM type 2: on diet controlled.   History of PE: Was on heparin gtt due to worsening AKI.  renal function improved, now back on Xarelto.   SLE with inflammatory arthritis: Needs to follow up with Rheumatology.   Facial Rash: started on triamcinolone.   Mobility: Increase ambulation Diet: Regular diet Fluid: Reduce normal saline to 50 ml/hour. DVT prophylaxis:  Xarelto Code Status:   Code Status: Full Code  Family Communication:  Not at bedside Expected Discharge:  Likely home in 1 to 2 days.  Consultants:    Procedures:    Antimicrobials: Anti-infectives (From admission, onward)   None      Diet Order            Diet regular Room service appropriate? Yes; Fluid consistency: Thin  Diet effective now              Infusions:  . 0.9 % NaCl with KCl 20 mEq / L 100 mL/hr at 01/11/19 1148    Scheduled Meds: . cholestyramine light  4 g Oral BID  . diphenoxylate-atropine  2 tablet Oral TID AC  . feeding supplement  1 Container Oral TID BM  . insulin aspart  0-9 Units Subcutaneous TID WC  .  lipase/protease/amylase  72,000 Units Oral TID AC  . multivitamin with minerals  1 tablet Oral Daily  . nebivolol  2.5 mg Oral Daily  . omeprazole  20 mg Oral Daily  . ondansetron (ZOFRAN) IV  4 mg Intravenous Q8H  . polymixin-bacitracin  1 application Topical BID  . rivaroxaban  15 mg Oral Q supper    PRN meds: glucose, triamcinolone cream   Objective: Vitals:   01/10/19 2125 01/11/19 0443  BP: (!) 148/68 (!) 149/70  Pulse: 71 67  Resp: 17 17  Temp: 98.4 F (36.9  C) 98 F (36.7 C)  SpO2: 97% 96%    Intake/Output Summary (Last 24 hours) at 01/11/2019 1240 Last data filed at 01/11/2019 0900 Gross per 24 hour  Intake 542 ml  Output -  Net 542 ml   Filed Weights   01/05/19 0200  Weight: 55.1 kg   Weight change:  Body mass index is 23.33 kg/m.   Physical Exam: General exam: Appears calm and comfortable.  Skin: No rashes, lesions or ulcers. HEENT: Atraumatic, normocephalic, supple neck, no obvious bleeding Lungs: Clear to auscultation bilaterally CVS: Regular rate and rhythm, no murmur GI/Abd soft, nondistended, mild diffuse tenderness, bowel sound present CNS: Alert, awake, oriented x3 Psychiatry: Mood appropriate Extremities: No pedal edema, no calf tenderness. Mild bruising on the medial aspect of the right distal foot after bumping on her bed yesterday.  Data Review: I have personally reviewed the laboratory data and studies available.  Recent Labs  Lab 01/07/19 0456 01/08/19 0158 01/09/19 0135 01/10/19 0216 01/11/19 0513  WBC 8.2 6.2 7.1 7.7 6.1  HGB 9.5* 9.3* 9.2* 8.5* 8.8*  HCT 29.0* 28.1* 28.2* 25.8* 27.0*  MCV 94.8 94.9 94.9 94.5 95.7  PLT 288 269 267 262 292   Recent Labs  Lab 01/06/19 0933 01/07/19 1024 01/08/19 0743 01/09/19 0135 01/11/19 0513  NA 141 137 138 138 141  K 3.4* 3.0* 3.8 4.1 4.5  CL 119* 116* 116* 114* 112*  CO2 16* 14* 16* 17* 20*  GLUCOSE 104* 108* 92 96 90  BUN 5* <5* 5* 5* <5*  CREATININE 1.03* 0.90 0.91 0.84 0.86  CALCIUM 9.4 8.9 8.7* 8.3* 8.8*  MG  --  1.0* 1.7 2.0  --     Terrilee Croak, MD  [facility] 01/11/2019

## 2019-01-11 NOTE — Progress Notes (Signed)
Subjective: She had a liquid stool this AM and 24 hours ago, but two semi formed stools during the daytime yesterday.  Objective: Vital signs in last 24 hours: Temp:  [98 F (36.7 C)-99 F (37.2 C)] 99 F (37.2 C) (11/27 1353) Pulse Rate:  [62-71] 62 (11/27 1353) Resp:  [17] 17 (11/27 1353) BP: (148-155)/(68-71) 155/71 (11/27 1353) SpO2:  [96 %-100 %] 100 % (11/27 1353) Last BM Date: 01/11/19  Intake/Output from previous day: 11/26 0701 - 11/27 0700 In: 222 [P.O.:222] Out: -  Intake/Output this shift: Total I/O In: 600 [P.O.:600] Out: -   General appearance: alert and no distress GI: soft, non-tender; bowel sounds normal; no masses,  no organomegaly  Lab Results: Recent Labs    01/09/19 0135 01/10/19 0216 01/11/19 0513  WBC 7.1 7.7 6.1  HGB 9.2* 8.5* 8.8*  HCT 28.2* 25.8* 27.0*  PLT 267 262 292   BMET Recent Labs    01/09/19 0135 01/11/19 0513  NA 138 141  K 4.1 4.5  CL 114* 112*  CO2 17* 20*  GLUCOSE 96 90  BUN 5* <5*  CREATININE 0.84 0.86  CALCIUM 8.3* 8.8*   LFT No results for input(s): PROT, ALBUMIN, AST, ALT, ALKPHOS, BILITOT, BILIDIR, IBILI in the last 72 hours. PT/INR No results for input(s): LABPROT, INR in the last 72 hours. Hepatitis Panel No results for input(s): HEPBSAG, HCVAB, HEPAIGM, HEPBIGM in the last 72 hours. C-Diff No results for input(s): CDIFFTOX in the last 72 hours. Fecal Lactopherrin No results for input(s): FECLLACTOFRN in the last 72 hours.  Studies/Results: No results found.  Medications:  Scheduled: . cholestyramine light  4 g Oral BID  . diphenoxylate-atropine  2 tablet Oral TID AC  . feeding supplement  1 Container Oral TID BM  . insulin aspart  0-9 Units Subcutaneous TID WC  . lipase/protease/amylase  72,000 Units Oral TID AC  . multivitamin with minerals  1 tablet Oral Daily  . nebivolol  2.5 mg Oral Daily  . omeprazole  20 mg Oral Daily  . ondansetron (ZOFRAN) IV  4 mg Intravenous Q8H  .  polymixin-bacitracin  1 application Topical BID  . rivaroxaban  15 mg Oral Q supper   Continuous: . 0.9 % NaCl with KCl 20 mEq / L 50 mL/hr at 01/11/19 1243    Assessment/Plan: 1) Diarrhea of unclear etiology. 2) Nausea.   There does appear to be clinical improvement with the scheduled Lomotil and cholestyramine.  It is not clear if this will be durable upon discharge.  Plan: 1) Continue with current treatment. 2) ? D/C tomorrow.  Dr. Collene Mares will reassess. 3) She may benefit with an outpatient second opinion consultation at a tertiary care center.  LOS: 7 days   Rithvik Orcutt D 01/11/2019, 1:53 PM

## 2019-01-12 LAB — BASIC METABOLIC PANEL
Anion gap: 8 (ref 5–15)
BUN: 6 mg/dL — ABNORMAL LOW (ref 8–23)
CO2: 20 mmol/L — ABNORMAL LOW (ref 22–32)
Calcium: 8.9 mg/dL (ref 8.9–10.3)
Chloride: 112 mmol/L — ABNORMAL HIGH (ref 98–111)
Creatinine, Ser: 0.77 mg/dL (ref 0.44–1.00)
GFR calc Af Amer: >60 mL/min (ref >60)
GFR calc non Af Amer: >60 mL/min (ref >60)
Glucose, Bld: 91 mg/dL (ref 70–99)
Potassium: 4.1 mmol/L (ref 3.5–5.1)
Sodium: 140 mmol/L (ref 135–145)

## 2019-01-12 LAB — CBC
HCT: 26.2 % — ABNORMAL LOW (ref 36.0–46.0)
Hemoglobin: 8.7 g/dL — ABNORMAL LOW (ref 12.0–15.0)
MCH: 31.1 pg (ref 26.0–34.0)
MCHC: 33.2 g/dL (ref 30.0–36.0)
MCV: 93.6 fL (ref 80.0–100.0)
Platelets: 292 10*3/uL (ref 150–400)
RBC: 2.8 MIL/uL — ABNORMAL LOW (ref 3.87–5.11)
RDW: 13.3 % (ref 11.5–15.5)
WBC: 6.2 10*3/uL (ref 4.0–10.5)
nRBC: 0 % (ref 0.0–0.2)

## 2019-01-12 LAB — GLUCOSE, CAPILLARY: Glucose-Capillary: 105 mg/dL — ABNORMAL HIGH (ref 70–99)

## 2019-01-12 MED ORDER — DIPHENOXYLATE-ATROPINE 2.5-0.025 MG PO TABS: 2.0000 | ORAL_TABLET | Freq: Three times a day (TID) | ORAL | 0 refills | Status: AC | PRN

## 2019-01-12 MED ORDER — CHOLESTYRAMINE LIGHT 4 G PO PACK: 4.0000 g | PACK | Freq: Two times a day (BID) | ORAL | 0 refills | Status: DC

## 2019-01-12 NOTE — Discharge Summary (Signed)
Physician Discharge Summary  [name] TWS:[id number] DOB: Sep 12, 1945 DOA: 01/04/2019  PCP: [name], MD  Admit date: 01/04/2019 Discharge date: 01/12/2019  Admitted From: Home Discharge disposition: Home   Code Status: Full Code  Diet Recommendation: Cardiac/diabetic diet   Recommendations for Outpatient Follow-Up:   1. Follow-up with GI as an outpatient 2. Follow-up with PCP as an outpatient  Discharge Diagnosis:   Active Problems:   Diabetes type 2, controlled (HCC)   HTN (hypertension)   GERD (gastroesophageal reflux disease)   Diarrhea   Pulmonary embolism (HCC)   Inflammatory arthritis   Hyponatremia    History of Present Illness / Brief narrative:  73 year old with past medical history significant for PE on Xarelto, bilateral carotid stenosis, systemic lupus, asthma, pulmonary nodules, pancreatic insufficiency with chronic diarrhea who presented to the ED on 11/20 with worsening persistent diarrhea.  Patient reported 7-8 episodes of watery diarrhea despite taking Lomotil twice a day. She was not able to keep herself hydrated. She saw her gastroenterologist and was a started on probiotic but it gave her nausea and vomiting and hence stopped. Patient also received a short course of low-dose prednisone for a flareup of her inflammatory arthritis.  Patient has been having diarrhea since June, she has had extensive work-up with endoscopy, colonoscopy and evaluation for C diff.Diarrhea was thought to be related to pancreatic exocrine insufficiency. During this admission creon dose was increased, lomotil was increased to 2 tablets four times a day PRN.   Hospital Course:  Acute on Chronic Diarrhea;  Presumed related to pancreatic exocrine insufficiency Evaluated by GI, Creon increased to 72,000 units. Lomotil increased to 2 tablets 3 times a day as needed. Also started on Questran. C. Diff and GI pathogen during this admission negative. Overall  diarrhea seems to be slowing down. Patient continues to repeat the same concern that, her diarrhea may return back once he is discharged to home. -GI follow-up appreciated.  Okay to discharge to home today.  Hypokalemia; normal today at 4.1.  Metabolic Acidosis;related to diarrhea. IV bolus, IV fluids.  CO2 level improved to 20 today.  AKI Presents with cr at 1.5. Likely related to Hypovolemia related to diarrhea.  Improved with IV fluids. Creatinine at 0.77 today.  HTN - Continue with Bystolic.  Benicar is on hold currently.  Diarrhea has slowed down, blood pressure has improved.  Resume Benicar post discharge.    Hyponatremia;resolved with IV fluids.   DM type 2: on diet controlled.   History of PE: Was on heparin gtt due to worsening AKI.  renal function improved, now back on Xarelto.   SLE with inflammatory arthritis: Needs to follow up with Rheumatology.   Facial Rash: started on triamcinolone.  Stable for discharge to home today.  Subjective:  Seen and examined this morning.  Pleasant elderly Caucasian female.  Sitting up at the edge of the bed.  Not in distress.  Diarrhea slowed down.  Ready to go home today.  Discharge Exam:   Vitals:   01/11/19 0443 01/11/19 1353 01/11/19 2132 01/12/19 0611  BP: (!) 149/70 (!) 155/71 (!) 142/71 (!) 164/87  Pulse: 67 62 69 63  Resp: 17 17 17 17   Temp: 98 F (36.7 C) 99 F (37.2 C) 99 F (37.2 C) 97.9 F (36.6 C)  TempSrc: Oral Oral Oral Oral  SpO2: 96% 100% 97% 100%  Weight:      Height:        Body mass index is 23.33 kg/m.  General exam: Appears calm and comfortable.  Skin: No rashes, lesions or ulcers. HEENT: Atraumatic, normocephalic, supple neck, no obvious bleeding Lungs: Clear to auscultation bilaterally CVS: Regular rate and rhythm, no murmur GI/Abd soft, nontender, nondistended, bowel sound present CNS: Alert, awake, oriented x3 Psychiatry: Mood appropriate Extremities: No pedal edema, no calf  tenderness  Discharge Instructions:  Wound care: None Discharge Instructions    Increase activity slowly   Complete by: As directed      Follow-up Information    [name], MD Follow up.   Specialty: Internal Medicine Contact information: [place] [address] [phone]        Dorothy Spark, MD .   Specialty: Cardiology Contact information: [place] [address] [phone]        Juanita Craver, MD Follow up.   Specialty: Gastroenterology Contact information: [address], [place] [address] [phone]          Allergies as of 01/12/2019      Reactions   Phenergan [promethazine Hcl] Shortness Of Breath, Anxiety   Amlodipine Swelling   Clarithromycin Nausea Only   I can pass out    Codeine Nausea Only   I can pass out    Erythromycin    Other reaction(s): Vomiting (intolerance)   Statins    Muscle pain    Tetracycline Other (See Comments)   unknown      Medication List    TAKE these medications   Azelaic Acid 15 % cream Apply 1 application topically 2 (two) times daily.   Benicar 20 MG tablet Generic drug: olmesartan Take one tablet daily   cholestyramine light 4 g packet Commonly known as: PREVALITE Take 1 packet (4 g total) by mouth 2 (two) times daily.   clobetasol 0.05 % external solution Commonly known as: TEMOVATE Apply 1 application topically every morning.   diphenoxylate-atropine 2.5-0.025 MG tablet Commonly known as: LOMOTIL Take 2 tablets by mouth 3 (three) times daily as needed for up to 10 days for diarrhea or loose stools. What changed:   how much to take  when to take this   halobetasol 0.05 % cream Commonly known as: ULTRAVATE Apply 1 application topically 2 (two) times daily.   lipase/protease/amylase 36000 UNITS Cpep capsule Commonly known as: CREON Take 1 capsule (36,000 Units total) by mouth 3 (three) times daily with meals.    nebivolol 2.5 MG tablet Commonly known as: Bystolic TAKE ONE (1) TABLET BY MOUTH EVERY DAY What changed:   how much to take  how to take this  when to take this  additional instructions   omeprazole 20 MG capsule Commonly known as: PRILOSEC Take 20 mg by mouth daily.   ondansetron 4 MG tablet Commonly known as: ZOFRAN Take 1 tablet (4 mg total) by mouth every 8 (eight) hours as needed for nausea or vomiting.   OneTouch Ultra test strip Generic drug: glucose blood USE TEST STRIPS TO CHECK BLOOD SUGAR AT LEAST 3 TIMES DAILY   Rivaroxaban 15 MG Tabs tablet Commonly known as: Xarelto Take 1 tablet (15 mg total) by mouth daily with supper. What changed: when to take this   TUMS CHEWY BITES PO Take 1 tablet by mouth daily as needed (For heartburn). After each meal       Time coordinating discharge: 35 minutes  The results of significant diagnostics from this hospitalization (including imaging, microbiology, ancillary and laboratory) are listed below for reference.  Procedures and Diagnostic Studies:   No results found.   Labs:   Basic Metabolic Panel: Recent Labs  Lab 01/07/19 1024 01/08/19 0743 01/09/19 0135 01/11/19 0513 01/12/19 0246  NA 137 138 138 141 140  K 3.0* 3.8 4.1 4.5 4.1  CL 116* 116* 114* 112* 112*  CO2 14* 16* 17* 20* 20*  GLUCOSE 108* 92 96 90 91  BUN <5* 5* 5* <5* 6*  CREATININE 0.90 0.91 0.84 0.86 0.77  CALCIUM 8.9 8.7* 8.3* 8.8* 8.9  MG 1.0* 1.7 2.0  --   --    GFR Estimated Creatinine Clearance: 44.8 mL/min (by C-G formula based on SCr of 0.77 mg/dL). Liver Function Tests: No results for input(s): AST, ALT, ALKPHOS, BILITOT, PROT, ALBUMIN in the last 168 hours. No results for input(s): LIPASE, AMYLASE in the last 168 hours. No results for input(s): AMMONIA in the last 168 hours. Coagulation profile No results for input(s): INR, PROTIME in the last 168 hours.  CBC: Recent Labs  Lab 01/08/19 0158 01/09/19 0135 01/10/19 0216  01/11/19 0513 01/12/19 0246  WBC 6.2 7.1 7.7 6.1 6.2  HGB 9.3* 9.2* 8.5* 8.8* 8.7*  HCT 28.1* 28.2* 25.8* 27.0* 26.2*  MCV 94.9 94.9 94.5 95.7 93.6  PLT 269 267 262 292 292   Cardiac Enzymes: No results for input(s): CKTOTAL, CKMB, CKMBINDEX, TROPONINI in the last 168 hours. BNP: Invalid input(s): POCBNP CBG: Recent Labs  Lab 01/11/19 0753 01/11/19 1219 01/11/19 1634 01/11/19 2128 01/12/19 0810  GLUCAP 86 84 98 105* 105*   D-Dimer No results for input(s): DDIMER in the last 72 hours. Hgb A1c No results for input(s): HGBA1C in the last 72 hours. Lipid Profile No results for input(s): CHOL, HDL, LDLCALC, TRIG, CHOLHDL, LDLDIRECT in the last 72 hours. Thyroid function studies No results for input(s): TSH, T4TOTAL, T3FREE, THYROIDAB in the last 72 hours.  Invalid input(s): FREET3 Anemia work up No results for input(s): VITAMINB12, FOLATE, FERRITIN, TIBC, IRON, RETICCTPCT in the last 72 hours. Microbiology Recent Results (from the past 240 hour(s))  Gastrointestinal Panel by PCR , Stool     Status: None   Collection Time: 01/04/19  8:20 PM   Specimen: Stool  Result Value Ref Range Status   Campylobacter species NOT DETECTED NOT DETECTED Final   Plesimonas shigelloides NOT DETECTED NOT DETECTED Final   Salmonella species NOT DETECTED NOT DETECTED Final   Yersinia enterocolitica NOT DETECTED NOT DETECTED Final   Vibrio species NOT DETECTED NOT DETECTED Final   Vibrio cholerae NOT DETECTED NOT DETECTED Final   Enteroaggregative E coli (EAEC) NOT DETECTED NOT DETECTED Final   Enteropathogenic E coli (EPEC) NOT DETECTED NOT DETECTED Final   Enterotoxigenic E coli (ETEC) NOT DETECTED NOT DETECTED Final   Shiga like toxin producing E coli (STEC) NOT DETECTED NOT DETECTED Final   Shigella/Enteroinvasive E coli (EIEC) NOT DETECTED NOT DETECTED Final   Cryptosporidium NOT DETECTED NOT DETECTED Final   Cyclospora cayetanensis NOT DETECTED NOT DETECTED Final   Entamoeba  histolytica NOT DETECTED NOT DETECTED Final   Giardia lamblia NOT DETECTED NOT DETECTED Final   Adenovirus F40/41 NOT DETECTED NOT DETECTED Final   Astrovirus NOT DETECTED NOT DETECTED Final   Norovirus GI/GII NOT DETECTED NOT DETECTED Final   Rotavirus A NOT DETECTED NOT DETECTED Final   Sapovirus (I, II, IV, and V) NOT DETECTED NOT DETECTED Final    Comment: Performed at [facility], [address] [place], [place] [address]  C Difficile Quick Screen w PCR  reflex     Status: None   Collection Time: 01/04/19  8:20 PM   Specimen: Stool  Result Value Ref Range Status   C Diff antigen NEGATIVE NEGATIVE Final   C Diff toxin NEGATIVE NEGATIVE Final   C Diff interpretation No C. difficile detected.  Final    Comment: Performed at [facility], [place] [address] [place], [place] [address]  SARS CORONAVIRUS 2 (TAT 6-24 HRS) Nasopharyngeal Nasopharyngeal Swab     Status: None   Collection Time: 01/04/19  8:46 PM   Specimen: Nasopharyngeal Swab  Result Value Ref Range Status   SARS Coronavirus 2 NEGATIVE NEGATIVE Final    Comment: (NOTE) SARS-CoV-2 target nucleic acids are NOT DETECTED. The SARS-CoV-2 RNA is generally detectable in upper and lower respiratory specimens during the acute phase of infection. Negative results do not preclude SARS-CoV-2 infection, do not rule out co-infections with other pathogens, and should not be used as the sole basis for treatment or other patient management decisions. Negative results must be combined with clinical observations, patient history, and epidemiological information. The expected result is Negative. Fact Sheet for Patients: SugarRoll.be Fact Sheet for Healthcare Providers: https://www.woods-mathews.com/ This test is not yet approved or cleared by the [place] FDA and  has been authorized for detection and/or diagnosis of SARS-CoV-2 by FDA under an Emergency Use Authorization (EUA).  This EUA will remain  in effect (meaning this test can be used) for the duration of the COVID-19 declaration under Section 56 4(b)(1) of the Act, 21 U.S.C. section 360bbb-3(b)(1), unless the authorization is terminated or revoked sooner. Performed at [facility], [place] [address] [place], [place] [address]     Please note: You were cared for by a hospitalist during your hospital stay. Once you are discharged, your primary care physician will handle any further medical issues. Please note that NO REFILLS for any discharge medications will be authorized once you are discharged, as it is imperative that you return to your primary care physician (or establish a relationship with a primary care physician if you do not have one) for your post hospital discharge needs so that they can reassess your need for medications and monitor your lab values.  Signed: Terrilee Croak  [facility] 01/12/2019, 11:42 AM

## 2019-01-12 NOTE — Progress Notes (Signed)
Subjective: Since I last evaluated the patient, she seems to be doing much better.  She has had 1 bowel movement this morning.  She denies having any abdominal pain nausea vomiting.  She has several questions about a pancreatic transplant considering her history of EPI requiring high-dose pancreatic enzyme supplements.  She is almost afraid to go home for the fear of recurrence of her symptoms.  She also has questions about her antihypertensive medications.  Patient has a whole list of questions written down almost an obsessive manner.  Objective: Vital signs in last 24 hours: Temp:  [97.9 F (36.6 C)-99 F (37.2 C)] 97.9 F (36.6 C) (11/28 1660) Pulse Rate:  [62-69] 63 (11/28 0611) Resp:  [17] 17 (11/28 0611) BP: (142-164)/(71-87) 164/87 (11/28 0611) SpO2:  [97 %-100 %] 100 % (11/28 0611) Last BM Date: 01/11/19  Intake/Output from previous day: 11/27 0701 - 11/28 0700 In: 55 [P.O.:840; I.V.:50] Out: -  Intake/Output this shift: Total I/O In: 220 [P.O.:220] Out: -   General appearance: alert, cooperative, appears stated age and no distress Resp: clear to auscultation bilaterally Cardio: regular rate and rhythm, S1, S2 normal, no murmur, click, rub or gallop GI: soft, non-tender; bowel sounds normal; no masses,  no organomegaly Extremities: extremities normal, atraumatic, no cyanosis or edema  Lab Results: Recent Labs    01/10/19 0216 01/11/19 0513 01/12/19 0246  WBC 7.7 6.1 6.2  HGB 8.5* 8.8* 8.7*  HCT 25.8* 27.0* 26.2*  PLT 262 292 292   BMET Recent Labs    01/11/19 0513 01/12/19 0246  NA 141 140  K 4.5 4.1  CL 112* 112*  CO2 20* 20*  GLUCOSE 90 91  BUN <5* 6*  CREATININE 0.86 0.77  CALCIUM 8.8* 8.9   Medications: I have reviewed the patient's current medications.  Assessment/Plan: 1) severe diarrhea which is now under control with cholestyramine and increasing pancreatic enzyme supplements.  As I have mentioned before I feel this is multifactorial in  addition to patient's IBS as she has an obsessive compulsive personality and I am quite convinced that she has a component of IBS.Marland Kitchen She has been advised to continue her pancreatic enzyme supplements when she gets goes home she is prime for discharge today new prescription with a higher dose will be called into her pharmacy on Monday avoidance of use of all artificial sugars including Splenda and Stevia etc. has been encouraged as there is a risk of osmotic diarrhea with these agents. I have answered all of the questions she has to the best of my ability.  I told her a pancreatic transplant was not in the plans for the treatment of her condition that seems to be responding to enzyme supplements. 2) GERD: On PPI's. 3) HTN. 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 5) History of PE: On Xarelto. 6) SLE. 7) Anemia of chronic disease. 8) Anxiety disorder. 9) Lung nodules on CT scan. 10) Small vessel cerebrovascular disease.  LOS: 8 days   Juanita Craver 01/12/2019, 8:00 AM

## 2019-01-12 NOTE — Progress Notes (Signed)
Discharge pt to home. Instructions given and explained.

## 2019-01-14 ENCOUNTER — Ambulatory Visit: Payer: Medicare Other | Admitting: Rheumatology

## 2019-01-14 ENCOUNTER — Telehealth: Payer: Self-pay

## 2019-01-14 NOTE — Telephone Encounter (Signed)
Pt is on TCM list after admission for diarrhea and AKI. Pt was discharged on 01/12/2019 and to follow up with PCP within 2 weeks.   LVM for pt to call back to schedule TCM hospital follow up with PCP.

## 2019-01-15 NOTE — Telephone Encounter (Signed)
Pt return call bck appt has been made for 01/17/19 complete TCM questionnaire below.Johny Chess  Transition Care Management Follow-up Telephone Call   Date discharged? 01/12/19   How have you been since you were released from the hospital? Pt states she is doing alright   Do you understand why you were in the hospital? YES   Do you understand the discharge instructions? YES   Where were you discharged to? HOME   Items Reviewed:  Medications reviewed: YES, pt states there was no changes   Allergies reviewed: YES  Dietary changes reviewed: YES, heart healthy & diabetic diet  Referrals reviewed: No referral recommeded   Functional Questionnaire:   Activities of Daily Living (ADLs):   She states she are independent in the following: ambulation, bathing and hygiene, feeding, continence, grooming, toileting and dressing States she doesn't require assistance    Any transportation issues/concerns?: NO   Any patient concerns? NO   Confirmed importance and date/time of follow-up visits scheduled YES, appt 01/17/19  Provider Appointment booked with Dr. Quay Burow  Confirmed with patient if condition begins to worsen call PCP or go to the ER.  Patient was given the office number and encouraged to call back with question or concerns.  : YES

## 2019-01-16 ENCOUNTER — Ambulatory Visit (INDEPENDENT_AMBULATORY_CARE_PROVIDER_SITE_OTHER): Payer: Medicare Other | Admitting: Cardiology

## 2019-01-16 ENCOUNTER — Encounter: Payer: Self-pay | Admitting: Cardiology

## 2019-01-16 ENCOUNTER — Other Ambulatory Visit: Payer: Self-pay

## 2019-01-16 VITALS — BP 134/68 | HR 75 | Ht 60.5 in | Wt 119.4 lb

## 2019-01-16 DIAGNOSIS — I2699 Other pulmonary embolism without acute cor pulmonale: Secondary | ICD-10-CM

## 2019-01-16 DIAGNOSIS — I739 Peripheral vascular disease, unspecified: Secondary | ICD-10-CM

## 2019-01-16 DIAGNOSIS — R600 Localized edema: Secondary | ICD-10-CM | POA: Diagnosis not present

## 2019-01-16 DIAGNOSIS — M7989 Other specified soft tissue disorders: Secondary | ICD-10-CM | POA: Diagnosis not present

## 2019-01-16 MED ORDER — FUROSEMIDE 20 MG PO TABS: ORAL_TABLET | ORAL | 4 refills | Status: DC

## 2019-01-16 NOTE — Progress Notes (Signed)
Subjective:    Patient ID: [name], female    DOB: 06-22-1945, 73 y.o.   MRN: [id number]  HPI The patient is here for follow up from the hospital.  Admitted 01/04/19 - 01/12/19  She went to the ED with worsening of her persistent diarrhea.  She was having 7-8 episodes of watery diarrhea despite taking lomotil BID and creon.  She was not able to keep hydrated.  Dr Collene Mares started her on a probiotic, but it gave her nausea and vomiting and stopped it.  She became very weak.    Chronic diarrhea: Acute worsening Full w/u in June - negative, thought to be pancreatic insufficiency C diff and GI pathogen panel negative now GI was consulted.  Her Creon dose was increased, lomotil was increased to 2 tabs 4 times a day prn.   She was also started on Questran.  Diarrhea improved  Hypokalemia: repleted  Metabolic acidosis: Related to diarrhea IVF CO2 level improved  AKI Cr 1.5 on admission Likely related to hypovolemia related to diarrhea  Improved with IVF Cr 0.77 on d/c  HTN: Continued on bystolic Benicar held - resumed post d/c  Hyponatremia: Resolved with IVF  DM: Diet controlled  H/o PE: xarelto held d/t AKI Was on heparin gtt xarelto resumed on d/c  SLE with inflammatory arthritis: Following with rheumatology  Facial rash: Triamcinolone  Discharged to home   Diarrhea:  The diarrhea improved very temporarily.  The diarrhea has gotten worse since she came home.  Multiple episodes a day, 1/2 liquid - 1/2 soft stools.  She denies any blood.  She is not able to keep any food down.  She can keep some liquids down, but not most of them.  She is drinking water and ensure. She is nauseous and vomiting. She feels very weak.   She is starting to get abodminal pain from vomiting.  Her abdomen is very bloated.    She saw rheumatology at [facility] and he would like additional blood work ordered.  He is not convinced she has lupus.    She is taking all of her medications.   She feels horrible because of the diarrhea and weakness.    Medications and allergies reviewed with patient and updated if appropriate.  Patient Active Problem List   Diagnosis Date Noted  . Hyponatremia 01/04/2019  . Inflammatory arthritis 01/02/2019  . Positive ANA (antinuclear antibody) 01/02/2019  . Pancreatic insufficiency 10/23/2018  . Multiple pulmonary nodules determined by computed tomography of lung 09/18/2018  . Decreased GFR 08/21/2018  . Nausea vomiting and diarrhea 08/20/2018  . Syncope 08/01/2018  . Hypokalemia 08/01/2018  . Abnormal CT scan, pelvis 07/31/2018  . Pneumothorax 03/14/2018  . Lung abnormality 01/11/2018  . Pulmonary embolism ([place]) 12/24/2017  . Lung mass 12/24/2017  . Small vessel disease, cerebrovascular 07/31/2017  . Atypical chest pain 06/14/2017  . Dizziness 06/14/2017  . Hyperuricemia 05/03/2017  . Arthralgia 05/01/2017  . Hair loss 05/01/2017  . Vitamin D deficiency 10/31/2016  . Diarrhea 06/06/2016  . Diastolic dysfunction 62/13/0865  . Vertigo 12/24/2015  . Nonspecific abnormal electrocardiogram (ECG) (EKG) 12/24/2015  . Bilateral carotid artery disease, Mild 05/31/2015  . Thyroid nodule 05/31/2015  . Psoriasis   . Scoliosis   . Diabetes type 2, controlled ([facility]) 06/03/2008  . Dyslipidemia 06/03/2008  . CARPAL TUNNEL SYNDROME 06/03/2008  . HTN (hypertension) 06/03/2008  . ALLERGIC RHINITIS 06/03/2008  . Asthma 06/03/2008  . GERD (gastroesophageal reflux disease) 06/03/2008  . Osteopenia 06/03/2008  .  COLONIC POLYPS, HX OF 06/03/2008    Current Outpatient Medications on File Prior to Visit  Medication Sig Dispense Refill  . Azelaic Acid 15 % cream Apply 1 application topically as needed.     Marland Kitchen BENICAR 20 MG tablet Take one tablet daily 30 tablet 5  . Calcium Carbonate Antacid (TUMS CHEWY BITES PO) Take 1 tablet by mouth daily as needed (For heartburn). After each meal     . cholestyramine light (PREVALITE) 4 g packet Take 1 packet  (4 g total) by mouth 2 (two) times daily. 60 packet 0  . clindamycin (CLEOCIN T) 1 % external solution Apply to small bumps and arms and legs    . clobetasol (TEMOVATE) 0.05 % external solution Apply 1 application topically every morning.     . diphenoxylate-atropine (LOMOTIL) 2.5-0.025 MG tablet Take 2 tablets by mouth 3 (three) times daily as needed for up to 10 days for diarrhea or loose stools. 30 tablet 0  . Flurandrenolide 4 MCG/SQCM TAPE Apply topically daily as needed    . furosemide (LASIX) 20 MG tablet Take 1 tablet (20 mg total) by mouth daily for 5 days only, then take 1 tablet (20 mg total) by mouth daily only as needed for swelling thereafter. 30 tablet 4  . halobetasol (ULTRAVATE) 0.05 % cream Apply 1 application topically 2 (two) times daily.     . lipase/protease/amylase (CREON) 36000 UNITS CPEP capsule Take 72,000 Units by mouth 3 (three) times daily with meals.    . nebivolol (BYSTOLIC) 2.5 MG tablet Take 2.5 mg by mouth daily.    Marland Kitchen omeprazole (PRILOSEC) 20 MG capsule Take 20 mg by mouth daily.    . ondansetron (ZOFRAN) 4 MG tablet Take 1 tablet (4 mg total) by mouth every 8 (eight) hours as needed for nausea or vomiting. 30 tablet 0  . ONETOUCH ULTRA test strip USE TEST STRIPS TO CHECK BLOOD SUGAR AT LEAST 3 TIMES DAILY 300 strip 1  . Rivaroxaban (XARELTO) 15 MG TABS tablet Take 15 mg by mouth daily with supper.     No current facility-administered medications on file prior to visit.     Past Medical History:  Diagnosis Date  . ALLERGIC RHINITIS   . ANEMIA-NOS   . Arthritis   . ASTHMA   . Carpal tunnel syndrome   . COLONIC POLYPS, HX OF   . Coronary artery disease    "mild CAD" noted on 12/05/17 in coronary CT scan  . DIABETES MELLITUS, TYPE II    diet controlled  . GERD   . HYPERLIPIDEMIA   . HYPERTENSION   . OSTEOPENIA   . PONV (postoperative nausea and vomiting)   . Psoriasis    severe, began soriatane 01/2012  . Rectal fissure   . Scoliosis     Past  Surgical History:  Procedure Laterality Date  . benign rectal growth  2004   removed by Dr. Zella Richer  . BIOPSY  08/09/2018   Procedure: BIOPSY;  Surgeon: Carol Ada, MD;  Location: [facility] ENDOSCOPY;  Service: Endoscopy;;  . BIOPSY  08/22/2018   Procedure: BIOPSY;  Surgeon: Juanita Craver, MD;  Location: [facility] ENDOSCOPY;  Service: Endoscopy;;  . BREAST BIOPSY Right   . BREAST EXCISIONAL BIOPSY Left   . BREAST SURGERY  1988   biopsy  . CESAREAN SECTION    . COLONOSCOPY N/A 08/22/2018   Procedure: COLONOSCOPY;  Surgeon: Juanita Craver, MD;  Location: [facility] ENDOSCOPY;  Service: Endoscopy;  Laterality: N/A;  . ESOPHAGOGASTRODUODENOSCOPY (EGD) WITH PROPOFOL  N/A 08/04/2018   Procedure: ESOPHAGOGASTRODUODENOSCOPY (EGD) WITH PROPOFOL;  Surgeon: Ladene Artist, MD;  Location: [place];  Service: Gastroenterology;  Laterality: N/A;  . FLEXIBLE SIGMOIDOSCOPY N/A 08/04/2018   Procedure: FLEXIBLE SIGMOIDOSCOPY;  Surgeon: Ladene Artist, MD;  Location: [place];  Service: Gastroenterology;  Laterality: N/A;  . FLEXIBLE SIGMOIDOSCOPY N/A 08/09/2018   Procedure: FLEXIBLE SIGMOIDOSCOPY;  Surgeon: Carol Ada, MD;  Location: [place];  Service: Endoscopy;  Laterality: N/A;  . VIDEO BRONCHOSCOPY WITH ENDOBRONCHIAL NAVIGATION N/A 03/14/2018   Procedure: VIDEO BRONCHOSCOPY WITH ENDOBRONCHIAL NAVIGATION;  Surgeon: Collene Gobble, MD;  Location: [facility] OR;  Service: Thoracic;  Laterality: N/A;    Social History   Socioeconomic History  . Marital status: Single    Spouse name: Not on file  . Number of children: 1  . Years of education: 15  . Highest education level: Bachelor's degree (e.g., BA, AB, BS)  Occupational History  . Occupation: retired Careers adviser  Social Needs  . Financial resource strain: Not on file  . Food insecurity    Worry: Not on file    Inability: Not on file  . Transportation needs    Medical: Not on file    Non-medical: Not on file  Tobacco Use   . Smoking status: Former Smoker    Packs/day: 1.00    Years: 10.00    Pack years: 10.00    Quit date: 02/14/1974    Years since quitting: 44.9  . Smokeless tobacco: Never Used  Substance and Sexual Activity  . Alcohol use: No  . Drug use: No  . Sexual activity: Not on file  Lifestyle  . Physical activity    Days per week: Not on file    Minutes per session: Not on file  . Stress: Not on file  Relationships  . Social Herbalist on phone: Not on file    Gets together: Not on file    Attends religious service: Not on file    Active member of club or organization: Not on file    Attends meetings of clubs or organizations: Not on file    Relationship status: Not on file  Other Topics Concern  . Not on file  Social History Narrative   Lives alone in a one story home.  Has one child and one grandchild.  Retired Careers adviser.  Education: college.     Family History  Problem Relation Age of Onset  . Lung cancer Father   . Arthritis Other        Parents  . Asthma Other        parent, other relative  . Breast cancer Other        other relative  . Hypertension Other        parent, other relative  . Heart disease Other        parent, other relative  . Heart disease Mother   . Asthma Mother   . Breast cancer Maternal Aunt 68  . Parkinson's disease Maternal Grandmother   . Rheumatic fever Maternal Grandfather     Review of Systems  Constitutional: Negative for chills and fever.  Respiratory: Negative for shortness of breath.   Cardiovascular: Positive for leg swelling. Negative for chest pain and palpitations.  Gastrointestinal: Positive for abdominal distention (bloating), abdominal pain (from vomiting), diarrhea, nausea and vomiting. Negative for blood in stool.  Neurological: Positive for light-headedness and headaches.  Objective:   Vitals:   01/17/19 1012  BP: 132/70  Pulse: 71  Temp: 98.6 F (37 C)  SpO2: 97%    BP Readings from Last 3 Encounters:  01/17/19 132/70  01/16/19 134/68  01/12/19 (!) 164/87   Wt Readings from Last 3 Encounters:  01/17/19 116 lb 12.8 oz (53 kg)  01/16/19 119 lb 6.4 oz (54.2 kg)  01/05/19 121 lb 7.6 oz (55.1 kg)   Body mass index is 22.44 kg/m.   Physical Exam    Constitutional:chronically ill appearing. No distress.  HENT:  Head: Normocephalic and atraumatic.  Neck: Neck supple. No tracheal deviation present. No thyromegaly present.  No cervical lymphadenopathy Cardiovascular: Normal rate, regular rhythm and normal heart sounds.  No murmur heard. No carotid bruit .  Mild B/L LE edema Pulmonary/Chest: Effort normal and breath sounds normal. No respiratory distress. No has no wheezes. No rales.  Abd: distended, soft, mild tenderness in lower abdomen w/o rebound or guarding Skin: Skin is warm and dry. Not diaphoretic.  Psychiatric: Normal mood and affect. Behavior is normal.   Lab Results  Component Value Date   WBC 6.2 01/12/2019   HGB 8.7 (L) 01/12/2019   HCT 26.2 (L) 01/12/2019   PLT 292 01/12/2019   GLUCOSE 91 01/12/2019   CHOL 256 (H) 10/23/2018   TRIG 161.0 (H) 10/23/2018   HDL 52.30 10/23/2018   LDLDIRECT 165.0 04/30/2018   LDLCALC 171 (H) 10/23/2018   ALT 18 01/05/2019   AST 15 01/05/2019   NA 140 01/12/2019   K 4.1 01/12/2019   CL 112 (H) 01/12/2019   CREATININE 0.77 01/12/2019   BUN 6 (L) 01/12/2019   CO2 20 (L) 01/12/2019   TSH 2.889 08/09/2018   INR 1.00 03/14/2018   HGBA1C 5.8 12/19/2018   MICROALBUR <0.7 10/28/2015      Assessment & Plan:    See Problem List for Assessment and Plan of chronic medical problems.

## 2019-01-16 NOTE — Patient Instructions (Addendum)
  Tests ordered today. Your results will be released to [place] (or called to you) after review.  If any changes need to be made, you will be notified at that same time.   Medications reviewed and updated.  Changes include :   zofran 8 mg three times a day as needed  Your prescription(s) have been submitted to your pharmacy. Please take as directed and contact our office if you believe you are having problem(s) with the medication(s).

## 2019-01-16 NOTE — Progress Notes (Signed)
Cardiology Office Note:    Date:  01/16/2019   ID:  [name], DOB Aug 02, 1945, MRN [id number]  PCP:  [name], MD  Cardiologist:  Ena Dawley, MD   Referring MD: [name], MD   Chief complain: Shortness of breath, uncontrolled hypertension, hyperlipidemia  History of Present Illness:    [name] is a 73 y.o. female with a hx of difficult to control hypertension, hyperlipidemia, who is coming for concern of worsening shortness of breath on exertion, she has been having difficulties controlling her blood pressure, now managed well by Dr. Quay Burow, she was previously on amlodipine that caused lower extremity edema, her blood pressure could be as high as 200, also any stool low she gets dizzy and she was experiencing falls. She has recently underwent brain MRI that showed progressive microvascular disease. She underwent coronary CTA that showed mild nonobstructive CAD.  She was started on pitavastatin 1 mg, she previously did not tolerate pravastatin and atorvastatin.  She developed hip pain and back pain and discontinued pitavastatin with some improvement of symptoms, she is willing to retry again.  She was also found to have a lung mass in her left lower lobe, full chest CT showed multiple other masses however PET CT was negative for malignancy.  She is now following with pulmonary Dr. Lake Bells who wants to perform bronchoscopy in the near future.  She denies any chest pain.  She recently had bronchitis and still is recovering from that continues to have productive cough.  She was given Levaquin by Dr. Lake Bells.  09/07/2018 - 3 months follow up, the patient has been hospitalized twice with chronic diarrhea nausea and vomiting, she has been tested excessively including Salmonella, Shigella, Giardia, C. difficile with all the tests negative.  She continues to have mild diarrhea, she has lost over 10 pounds in 1 month.  She underwent colonoscopy without any bleeding, she  discontinued her cholesterol medications, she was started on hydralazine.  She is complaining of lower extremity erythema and swelling no obvious claudications.  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 She continues to loose weight, no bleeding, no CP, SOB, developed LE edema B/L in the last week from inactivity, no orthopnea.  Past Medical History:  Diagnosis Date  . ALLERGIC RHINITIS   . ANEMIA-NOS   . Arthritis   . ASTHMA   . Carpal tunnel syndrome   . COLONIC POLYPS, HX OF   . Coronary artery disease    "mild CAD" noted on 12/05/17 in coronary CT scan  . DIABETES MELLITUS, TYPE II    diet controlled  . GERD   . HYPERLIPIDEMIA   . HYPERTENSION   . OSTEOPENIA   . PONV (postoperative nausea and vomiting)   . Psoriasis    severe, began soriatane 01/2012  . Rectal fissure   . Scoliosis     Past Surgical History:  Procedure Laterality Date  . benign rectal growth  2004   removed by Dr. Zella Richer  . BIOPSY  08/09/2018   Procedure: BIOPSY;  Surgeon: Carol Ada, MD;  Location: [facility] ENDOSCOPY;  Service: Endoscopy;;  . BIOPSY  08/22/2018   Procedure: BIOPSY;  Surgeon: Juanita Craver, MD;  Location: [facility] ENDOSCOPY;  Service: Endoscopy;;  . BREAST BIOPSY Right   . BREAST EXCISIONAL BIOPSY Left   . BREAST SURGERY  1988   biopsy  . CESAREAN  SECTION    . COLONOSCOPY N/A 08/22/2018   Procedure: COLONOSCOPY;  Surgeon: Juanita Craver, MD;  Location: [facility] ENDOSCOPY;  Service: Endoscopy;  Laterality: N/A;  . ESOPHAGOGASTRODUODENOSCOPY (EGD) WITH PROPOFOL N/A 08/04/2018   Procedure: ESOPHAGOGASTRODUODENOSCOPY (EGD) WITH PROPOFOL;  Surgeon: Ladene Artist, MD;  Location: [place];  Service: Gastroenterology;  Laterality: N/A;  . FLEXIBLE SIGMOIDOSCOPY N/A 08/04/2018   Procedure: FLEXIBLE SIGMOIDOSCOPY;  Surgeon: Ladene Artist, MD;  Location: [place];  Service:  Gastroenterology;  Laterality: N/A;  . FLEXIBLE SIGMOIDOSCOPY N/A 08/09/2018   Procedure: FLEXIBLE SIGMOIDOSCOPY;  Surgeon: Carol Ada, MD;  Location: [place];  Service: Endoscopy;  Laterality: N/A;  . VIDEO BRONCHOSCOPY WITH ENDOBRONCHIAL NAVIGATION N/A 03/14/2018   Procedure: VIDEO BRONCHOSCOPY WITH ENDOBRONCHIAL NAVIGATION;  Surgeon: Collene Gobble, MD;  Location: [facility] OR;  Service: Thoracic;  Laterality: N/A;   Current Medications: Current Meds  Medication Sig  . Azelaic Acid 15 % cream Apply 1 application topically as needed.   Marland Kitchen BENICAR 20 MG tablet Take one tablet daily  . Calcium Carbonate Antacid (TUMS CHEWY BITES PO) Take 1 tablet by mouth daily as needed (For heartburn). After each meal   . cholestyramine light (PREVALITE) 4 g packet Take 1 packet (4 g total) by mouth 2 (two) times daily.  . clindamycin (CLEOCIN T) 1 % external solution Apply to small bumps and arms and legs  . clobetasol (TEMOVATE) 0.05 % external solution Apply 1 application topically every morning.   . diphenoxylate-atropine (LOMOTIL) 2.5-0.025 MG tablet Take 2 tablets by mouth 3 (three) times daily as needed for up to 10 days for diarrhea or loose stools.  . Flurandrenolide 4 MCG/SQCM TAPE Apply topically daily as needed  . halobetasol (ULTRAVATE) 0.05 % cream Apply 1 application topically 2 (two) times daily.   . lipase/protease/amylase (CREON) 36000 UNITS CPEP capsule Take 72,000 Units by mouth 3 (three) times daily with meals.  . nebivolol (BYSTOLIC) 2.5 MG tablet Take 2.5 mg by mouth daily.  Marland Kitchen omeprazole (PRILOSEC) 20 MG capsule Take 20 mg by mouth daily.  . ondansetron (ZOFRAN) 4 MG tablet Take 1 tablet (4 mg total) by mouth every 8 (eight) hours as needed for nausea or vomiting.  [name] test strip USE TEST STRIPS TO CHECK BLOOD SUGAR AT LEAST 3 TIMES DAILY  . Rivaroxaban (XARELTO) 15 MG TABS tablet Take 15 mg by mouth daily with supper.    Allergies:   Phenergan [promethazine hcl],  Amlodipine, Clarithromycin, Codeine, Erythromycin, Statins, and Tetracycline   Social History   Socioeconomic History  . Marital status: Single    Spouse name: Not on file  . Number of children: 1  . Years of education: 35  . Highest education level: Bachelor's degree (e.g., BA, AB, BS)  Occupational History  . Occupation: retired Careers adviser  Social Needs  . Financial resource strain: Not on file  . Food insecurity    Worry: Not on file    Inability: Not on file  . Transportation needs    Medical: Not on file    Non-medical: Not on file  Tobacco Use  . Smoking status: Former Smoker    Packs/day: 1.00    Years: 10.00    Pack years: 10.00    Quit date: 02/14/1974    Years since quitting: 44.9  . Smokeless tobacco: Never Used  Substance and Sexual Activity  . Alcohol use: No  . Drug use: No  . Sexual activity: Not on file  Lifestyle  . Physical activity    Days per week: Not on file    Minutes per session: Not on file  . Stress: Not on file  Relationships  . Social Herbalist on phone: Not on file    Gets together: Not on file    Attends religious service: Not on file    Active member of club or organization: Not on file    Attends meetings of clubs or organizations: Not on file    Relationship status: Not on file  Other Topics Concern  . Not on file  Social History Narrative   Lives alone in a one story home.  Has one child and one grandchild.  Retired Careers adviser.  Education: college.      Family History: The patient's family history includes Arthritis in an other family member; Asthma in her mother and another family member; Breast cancer in an other family member; Breast cancer (age of onset: 70) in her maternal aunt; Heart disease in her mother and another family member; Hypertension in an other family member; Lung cancer in her father; Parkinson's disease in her maternal grandmother;  Rheumatic fever in her maternal grandfather.  ROS:   Please see the history of present illness.    All other systems reviewed and are negative.  EKGs/Labs/Other Studies Reviewed:    The following studies were reviewed today:  EKG:  EKG is not ordered today.   Recent Labs: 08/09/2018: TSH 2.889 01/05/2019: ALT 18 01/09/2019: Magnesium 2.0 01/12/2019: BUN 6; Creatinine, Ser 0.77; Hemoglobin 8.7; Platelets 292; Potassium 4.1; Sodium 140  Recent Lipid Panel    Component Value Date/Time   CHOL 256 (H) 10/23/2018 0929   CHOL 179 09/03/2018 0824   TRIG 161.0 (H) 10/23/2018 0929   HDL 52.30 10/23/2018 0929   HDL 42 09/03/2018 0824   CHOLHDL 5 10/23/2018 0929   VLDL 32.2 10/23/2018 0929   LDLCALC 171 (H) 10/23/2018 0929   LDLCALC 87 09/03/2018 0824   LDLDIRECT 165.0 04/30/2018 0908    Physical Exam:    VS:  BP 134/68   Pulse 75   Ht 5' 0.5" (1.537 m)   Wt 119 lb 6.4 oz (54.2 kg)   SpO2 99%   BMI 22.93 kg/m     Wt Readings from Last 3 Encounters:  01/16/19 119 lb 6.4 oz (54.2 kg)  01/05/19 121 lb 7.6 oz (55.1 kg)  01/03/19 120 lb 6.4 oz (54.6 kg)    GEN: Well nourished, well developed in no acute distress HEENT: Normal NECK: No JVD; No carotid bruits LYMPHATICS: No lymphadenopathy CARDIAC: RRR, no murmurs, rubs, gallops RESPIRATORY:  Clear to auscultation without rales, wheezing or rhonchi  ABDOMEN: Soft, non-tender, non-distended MUSCULOSKELETAL: Minimal bilateral edema around her ankles, significant erythema in bilateral toes but good pulses.   SKIN: Warm and dry NEUROLOGIC:  Alert and oriented x 3 PSYCHIATRIC:  Normal affect   CT chest: 12/22/2017  1. In addition to the previously noted mass in the left lower lobe there several other smaller irregular-shaped nodules scattered throughout the lungs bilaterally. The irregular shape of the nodules would be unusual for metastatic disease. The possibility of a benign etiology such as multifocal bronchoceles should be  considered, but is a diagnosis of exclusion. Malignancy should be excluded, and further evaluation with PET-CT is again suggested. 2. Nonocclusive filling defect in the distal left main pulmonary artery compatible with pulmonary embolism. 3. Hepatic steatosis. 4. Aortic atherosclerosis, in addition to  left main and left anterior descending coronary artery disease. Assessment for potential risk factor modification, dietary therapy or pharmacologic therapy may be warranted, if clinically indicated. 5. There are calcifications of the aortic valve. Echocardiographic correlation for evaluation of potential valvular dysfunction may be warranted if clinically indicated.  Coronary CTA: 12/05/2017  1. Coronary calcium score of 6. This was 11 percentile for age and sex matched control. 2. Normal coronary origin with right dominance. 3. There is mild CAD in the proximal LAD, aggressive risk factor modification is recommended SOM most probably secondary to obesity and deconditioning.  PET CT: 01/01/2018  1. For the most part the branching nodularity in the lungs is of low activity and likely benign. Given the somewhat branching tubular appearance, the possibility of multifocal bronchocele is certainly raised. The lesion along the left upper lobe paramediastinal area, and in the right upper lobe just adjacent to the fissure do have some low-grade activity (1.6 and 1.7, respectively) which may well be inflammatory but for which surveillance imaging in 6 months time would be suggested. 2. The larger lesion in the left lower lobe is not hypermetabolic and is likely benign. Bilateral tonsillar activity in the neck is likely benign. 3. No worrisome findings in the neck, abdomen/pelvis, or skeleton.   ASSESSMENT:    No diagnosis found. PLAN:    In order of problems listed above:  1. 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 We will obtain B/L LE venous US.  2.  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  3.  Mild nonobstructive CAD -and aortic atherosclerosis as well as microvascular disease on her brain imaging, she discontinued statins and does not want to start taking fish oil in the settings of chronic diarrhea and weight loss.  I agree with that. She is asymptomatic.  4.  Hypertension -controlled with hydralazine.  5. Hyperlipidemia - as above we will hold for now and reevaluate at the next visit.  6.  Lower extremity edema, sec to inactivity, we will start Lasix 20 mg po daily x 5 days, afterwards PRN.    Medication Adjustments/Labs and Tests Ordered: Current medicines are reviewed at length with the patient today.  Concerns regarding medicines are outlined above.  No orders of the defined types were placed in this encounter.  No orders of the defined types were placed in this encounter.   There are no Patient Instructions on file for this visit.   Signed, Ena Dawley, MD  01/16/2019 10:13 AM    [place]

## 2019-01-16 NOTE — Patient Instructions (Signed)
Medication Instructions:   START TAKING LASIX 20 MG BY MOUTH DAILY FOR 5 DAYS ONLY, THEN TAKE LASIX 20 MG BY MOUTH DAILY ONLY AS NEEDED FOR LOWER EXTREMITY SWELLING, THEREAFTER.   *If you need a refill on your cardiac medications before your next appointment, please call your pharmacy*    Testing/Procedures:  Your physician has requested that you have a lower extremity venous duplex. This test is an ultrasound of the veins in the legs or arms. It looks at venous blood flow that carries blood from the heart to the legs or arms. Allow one hour for a Lower Venous exam. Allow thirty minutes for an Upper Venous exam. There are no restrictions or special instructions.     Follow-Up: At [facility], you and your health needs are our priority.  As part of our continuing mission to provide you with exceptional heart care, we have created designated Provider Care Teams.  These Care Teams include your primary Cardiologist (physician) and Advanced Practice Providers (APPs -  Physician Assistants and Nurse Practitioners) who all work together to provide you with the care you need, when you need it.  Your next appointment:   4 month(s)  The format for your next appointment:   In Person  Provider:   Ena Dawley, MD

## 2019-01-17 ENCOUNTER — Other Ambulatory Visit: Payer: Self-pay

## 2019-01-17 ENCOUNTER — Encounter: Payer: Self-pay | Admitting: Internal Medicine

## 2019-01-17 ENCOUNTER — Ambulatory Visit (INDEPENDENT_AMBULATORY_CARE_PROVIDER_SITE_OTHER): Payer: Medicare Other | Admitting: Internal Medicine

## 2019-01-17 ENCOUNTER — Other Ambulatory Visit (INDEPENDENT_AMBULATORY_CARE_PROVIDER_SITE_OTHER): Payer: Medicare Other

## 2019-01-17 VITALS — BP 132/70 | HR 71 | Temp 98.6°F | Ht 60.5 in | Wt 116.8 lb

## 2019-01-17 DIAGNOSIS — K8689 Other specified diseases of pancreas: Secondary | ICD-10-CM

## 2019-01-17 DIAGNOSIS — E119 Type 2 diabetes mellitus without complications: Secondary | ICD-10-CM

## 2019-01-17 DIAGNOSIS — M1990 Unspecified osteoarthritis, unspecified site: Secondary | ICD-10-CM | POA: Diagnosis not present

## 2019-01-17 DIAGNOSIS — I10 Essential (primary) hypertension: Secondary | ICD-10-CM

## 2019-01-17 DIAGNOSIS — L409 Psoriasis, unspecified: Secondary | ICD-10-CM

## 2019-01-17 DIAGNOSIS — R768 Other specified abnormal immunological findings in serum: Secondary | ICD-10-CM | POA: Diagnosis not present

## 2019-01-17 DIAGNOSIS — R197 Diarrhea, unspecified: Secondary | ICD-10-CM

## 2019-01-17 DIAGNOSIS — R112 Nausea with vomiting, unspecified: Secondary | ICD-10-CM

## 2019-01-17 LAB — CBC WITH DIFFERENTIAL/PLATELET
Basophils Absolute: 0.1 10*3/uL (ref 0.0–0.1)
Basophils Relative: 0.9 % (ref 0.0–3.0)
Eosinophils Absolute: 0.1 10*3/uL (ref 0.0–0.7)
Eosinophils Relative: 1.4 % (ref 0.0–5.0)
HCT: 34 % — ABNORMAL LOW (ref 36.0–46.0)
Hemoglobin: 11.2 g/dL — ABNORMAL LOW (ref 12.0–15.0)
Lymphocytes Relative: 17.1 % (ref 12.0–46.0)
Lymphs Abs: 1.1 10*3/uL (ref 0.7–4.0)
MCHC: 32.8 g/dL (ref 30.0–36.0)
MCV: 93.8 fl (ref 78.0–100.0)
Monocytes Absolute: 0.6 10*3/uL (ref 0.1–1.0)
Monocytes Relative: 9.8 % (ref 3.0–12.0)
Neutro Abs: 4.6 10*3/uL (ref 1.4–7.7)
Neutrophils Relative %: 70.8 % (ref 43.0–77.0)
Platelets: 393 10*3/uL (ref 150.0–400.0)
RBC: 3.63 Mil/uL — ABNORMAL LOW (ref 3.87–5.11)
RDW: 13.6 % (ref 11.5–15.5)
WBC: 6.5 10*3/uL (ref 4.0–10.5)

## 2019-01-17 LAB — COMPREHENSIVE METABOLIC PANEL
ALT: 12 U/L (ref 0–35)
AST: 12 U/L (ref 0–37)
Albumin: 3.7 g/dL (ref 3.5–5.2)
Alkaline Phosphatase: 82 U/L (ref 39–117)
BUN: 7 mg/dL (ref 6–23)
CO2: 21 mEq/L (ref 19–32)
Calcium: 9.1 mg/dL (ref 8.4–10.5)
Chloride: 108 mEq/L (ref 96–112)
Creatinine, Ser: 0.97 mg/dL (ref 0.40–1.20)
GFR: 55.95 mL/min — ABNORMAL LOW (ref >60.00)
Glucose, Bld: 93 mg/dL (ref 70–99)
Potassium: 3.7 mEq/L (ref 3.5–5.1)
Sodium: 139 mEq/L (ref 135–145)
Total Bilirubin: 0.4 mg/dL (ref 0.2–1.2)
Total Protein: 7 g/dL (ref 6.0–8.3)

## 2019-01-17 LAB — BILIRUBIN, TOTAL: Total Bilirubin: 0.4 mg/dL (ref 0.2–1.2)

## 2019-01-17 LAB — SEDIMENTATION RATE: Sed Rate: 55 mm/hr — ABNORMAL HIGH (ref 0–30)

## 2019-01-17 LAB — C-REACTIVE PROTEIN: CRP: 1.5 mg/dL (ref 0.5–20.0)

## 2019-01-17 MED ORDER — ONDANSETRON 8 MG PO TBDP: 8.0000 mg | ORAL_TABLET | Freq: Three times a day (TID) | ORAL | 0 refills | Status: DC | PRN

## 2019-01-17 NOTE — Assessment & Plan Note (Signed)
?   Pancreatic insufficiency Continue Creon

## 2019-01-17 NOTE — Assessment & Plan Note (Signed)
Following with derm and rheum Will check labs for rheum - p-anca, p-anca, esr, crp, mpo/pr3, igG, haptoglobin, LDH

## 2019-01-17 NOTE — Assessment & Plan Note (Signed)
?   Pancreatic insufficiency On creon - still with diarrhea Taking lomotil, questran Multiple water, loose BMs daily Trying to drinking enough fluids, but high risk of dehydration/AKI If no improvement may need to go back to ED Cbc, cmp

## 2019-01-17 NOTE — Assessment & Plan Note (Signed)
Lab Results  Component Value Date   HGBA1C 5.8 12/19/2018   Diet controlled

## 2019-01-17 NOTE — Assessment & Plan Note (Signed)
BP good Monitor closely given possible dehydration and risk of AKI Continue medications cmp

## 2019-01-17 NOTE — Assessment & Plan Note (Signed)
Constant nausea and vomiting Keeping some fluids down zofran 4 mg no longer effective Try 8 mg of zofran Q 8 hr prn

## 2019-01-17 NOTE — Assessment & Plan Note (Signed)
p-anca, p-anca, esr, crp, mpo/pr3, igG, haptoglobin, LDH Following rheum ? cause

## 2019-01-17 NOTE — Assessment & Plan Note (Signed)
?  Lupus  p-anca, p-anca, esr, crp, mpo/pr3, igG, haptoglobin, LDH

## 2019-01-18 ENCOUNTER — Other Ambulatory Visit: Payer: Self-pay | Admitting: Internal Medicine

## 2019-01-18 ENCOUNTER — Ambulatory Visit (HOSPITAL_COMMUNITY): Payer: Medicare Other

## 2019-01-18 ENCOUNTER — Other Ambulatory Visit: Payer: Medicare Other

## 2019-01-18 LAB — IRON,TIBC AND FERRITIN PANEL
%SAT: 8 % (calc) — ABNORMAL LOW (ref 16–45)
Ferritin: 61 ng/mL (ref 16–288)
Iron: 19 ug/dL — ABNORMAL LOW (ref 45–160)
TIBC: 240 mcg/dL (calc) — ABNORMAL LOW (ref 250–450)

## 2019-01-18 LAB — MPO/PR-3 (ANCA) ANTIBODIES
Myeloperoxidase Abs: <1 AI
Serine Protease 3: <1 AI

## 2019-01-18 LAB — HAPTOGLOBIN: Haptoglobin: 281 mg/dL — ABNORMAL HIGH (ref 43–212)

## 2019-01-18 LAB — IGG: IgG (Immunoglobin G), Serum: 1277 mg/dL (ref 600–1540)

## 2019-01-18 LAB — LACTATE DEHYDROGENASE: LDH: 141 U/L (ref 120–250)

## 2019-01-18 NOTE — Addendum Note (Signed)
Addended by: Delice Bison E on: 01/18/2019 12:49 PM   Modules accepted: Orders

## 2019-01-19 MED ORDER — PAIN PM EXTRA STRENGTH 500-25 MG PO TABS
1.00 | ORAL_TABLET | ORAL | Status: DC
Start: ? — End: 2019-01-19

## 2019-01-19 MED ORDER — BAYER WOMENS 81-300 MG PO TABS
40.00 | ORAL_TABLET | ORAL | Status: DC
Start: 2019-01-22 — End: 2019-01-19

## 2019-01-19 MED ORDER — LOSARTAN POTASSIUM 50 MG PO TABS
50.00 | ORAL_TABLET | ORAL | Status: DC
Start: 2019-01-20 — End: 2019-01-19

## 2019-01-19 MED ORDER — SAXAGLIPTIN HCL 5 MG PO TABS
24000.00 | ORAL_TABLET | ORAL | Status: DC
Start: 2019-01-19 — End: 2019-01-19

## 2019-01-19 MED ORDER — EZ-SORB UNDERPAD MISC
15.00 | Status: DC
Start: 2019-01-22 — End: 2019-01-19

## 2019-01-19 MED ORDER — ACTIVE CATH MALE EXTERNAL MISC
4.00 | Status: DC
Start: 2019-01-21 — End: 2019-01-19

## 2019-01-21 DIAGNOSIS — R627 Adult failure to thrive: Secondary | ICD-10-CM | POA: Insufficient documentation

## 2019-01-21 LAB — ANCA SCREEN W REFLEX TITER

## 2019-01-22 MED ORDER — TROJAN ULTRA TEXTURE LUBRICATE MISC
3.13 | Status: DC
Start: 2019-01-22 — End: 2019-01-22

## 2019-01-22 MED ORDER — Medication
625.00 | Status: DC
Start: 2019-01-21 — End: 2019-01-22

## 2019-01-22 MED ORDER — METHADOSE 5 MG PO TABS
8.00 | ORAL_TABLET | ORAL | Status: DC
Start: ? — End: 2019-01-22

## 2019-01-22 MED ORDER — SAXAGLIPTIN HCL 5 MG PO TABS
76000.00 | ORAL_TABLET | ORAL | Status: DC
Start: 2019-01-22 — End: 2019-01-22

## 2019-01-22 MED ORDER — Medication
20.00 | Status: DC
Start: 2019-01-21 — End: 2019-01-22

## 2019-01-22 MED ORDER — PEDIASURE 1.0 CAL/FIBER PO LIQD
2.00 | ORAL | Status: DC
Start: 2019-01-21 — End: 2019-01-22

## 2019-01-22 NOTE — Progress Notes (Signed)
Subjective:    Patient ID: [name], female    DOB: 06/25/1945, 73 y.o.   MRN: [id number]  HPI The patient is here for follow up from the hospital.   Admitted 12/4 to [facility] with worsening diarrhea, FTT and dehydration.  She has 8-9 diarrhea episodes a day associated with lower abdominal cramping.  She is also having nausea and vomiting.  She has had significant weight loss and was not able to keep food, liquids down adequately.  Her Cdiff, GIP, stool diardia dn crypto, covid, HLA-B27, reticulin IGa Ab, tsh were normal.  Fecal lactoferrin was positive.  ANA positive with titre 1:1290.   ED VSS.  hemoblogin 10.7, Cr 1.16.  She was given 1.6 L LR for dehydration and mild AKI.  On exam her abdomen was soft, distended and non tender.    Chronic diarrhea: GI consulted Cdiff, HIV negative,  GI pathogen panel negative Pancreatic elastase was slightly low which suggests slight to moderate pancreatic insufficiency Pancreatic enzymes continued, zofran contnued Continued on cholestyramine xifanan prescribed but not covered by insurance and she does not want to take an antibiotic w/o test confirming SIBO  Non anion gap metabolic acidosis: Resolved with IVF  Dehydration, AKI: Improved with IVF   She is taking her medication daily.    She is drinking some water and ensure and is fighting to keep it down.  She is not able to keep much food down. She is feeling very weak and continues to lose weight. She is having diarrhea that is watery in consistency. She typically has 5-6 BM's a day but it can range from 3-12.  She has intermittent abdominal pain. She has abdominal bloating.  She denies fever, blood in stool.      Medications and allergies reviewed with patient and updated if appropriate.  Patient Active Problem List   Diagnosis Date Noted  . Hyponatremia 01/04/2019  . Inflammatory arthritis 01/02/2019  . Positive ANA (antinuclear antibody) 01/02/2019  . Pancreatic  insufficiency 10/23/2018  . Multiple pulmonary nodules determined by computed tomography of lung 09/18/2018  . Decreased GFR 08/21/2018  . Nausea vomiting and diarrhea 08/20/2018  . Syncope 08/01/2018  . Hypokalemia 08/01/2018  . Abnormal CT scan, pelvis 07/31/2018  . Pneumothorax 03/14/2018  . Lung abnormality 01/11/2018  . Pulmonary embolism ([place]) 12/24/2017  . Lung mass 12/24/2017  . Small vessel disease, cerebrovascular 07/31/2017  . Atypical chest pain 06/14/2017  . Dizziness 06/14/2017  . Hyperuricemia 05/03/2017  . Arthralgia 05/01/2017  . Hair loss 05/01/2017  . Vitamin D deficiency 10/31/2016  . Diarrhea 06/06/2016  . Diastolic dysfunction 20/94/7096  . Vertigo 12/24/2015  . Nonspecific abnormal electrocardiogram (ECG) (EKG) 12/24/2015  . Bilateral carotid artery disease, Mild 05/31/2015  . Thyroid nodule 05/31/2015  . Psoriasis   . Scoliosis   . Diabetes type 2, controlled ([place]) 06/03/2008  . Dyslipidemia 06/03/2008  . CARPAL TUNNEL SYNDROME 06/03/2008  . HTN (hypertension) 06/03/2008  . ALLERGIC RHINITIS 06/03/2008  . Asthma 06/03/2008  . GERD (gastroesophageal reflux disease) 06/03/2008  . Osteopenia 06/03/2008  . COLONIC POLYPS, HX OF 06/03/2008    Current Outpatient Medications on File Prior to Visit  Medication Sig Dispense Refill  . Azelaic Acid 15 % cream Apply 1 application topically as needed.     Marland Kitchen BENICAR 20 MG tablet Take one tablet daily 30 tablet 5  . Calcium Carbonate Antacid (TUMS CHEWY BITES PO) Take 1 tablet by mouth daily as needed (For heartburn). After each meal     .  cholestyramine light (PREVALITE) 4 g packet Take 1 packet (4 g total) by mouth 2 (two) times daily. 60 packet 0  . clindamycin (CLEOCIN T) 1 % external solution Apply to small bumps and arms and legs    . clobetasol (TEMOVATE) 0.05 % external solution Apply 1 application topically every morning.     . diphenoxylate-atropine (LOMOTIL) 2.5-0.025 MG tablet Take by mouth.    .  Flurandrenolide 4 MCG/SQCM TAPE Apply topically daily as needed    . halobetasol (ULTRAVATE) 0.05 % cream Apply 1 application topically 2 (two) times daily.     . lipase/protease/amylase (CREON) 36000 UNITS CPEP capsule Take 36,000 Units by mouth 3 (three) times daily with meals.     Marland Kitchen loperamide (IMODIUM) 2 MG capsule Take by mouth.    . nebivolol (BYSTOLIC) 2.5 MG tablet Take 2.5 mg by mouth daily.    Marland Kitchen omeprazole (PRILOSEC) 20 MG capsule Take 20 mg by mouth daily.    . ondansetron (ZOFRAN ODT) 8 MG disintegrating tablet Take 1 tablet (8 mg total) by mouth every 8 (eight) hours as needed for nausea or vomiting. 60 tablet 0  . ONETOUCH ULTRA test strip USE TEST STRIPS TO CHECK BLOOD SUGAR AT LEAST 3 TIMES DAILY 300 strip 1  . rifaximin (XIFAXAN) 550 MG TABS tablet Take by mouth.    . Rivaroxaban (XARELTO) 15 MG TABS tablet Take 15 mg by mouth daily with supper.     No current facility-administered medications on file prior to visit.     Past Medical History:  Diagnosis Date  . ALLERGIC RHINITIS   . ANEMIA-NOS   . Arthritis   . ASTHMA   . Carpal tunnel syndrome   . COLONIC POLYPS, HX OF   . Coronary artery disease    "mild CAD" noted on 12/05/17 in coronary CT scan  . DIABETES MELLITUS, TYPE II    diet controlled  . GERD   . HYPERLIPIDEMIA   . HYPERTENSION   . OSTEOPENIA   . PONV (postoperative nausea and vomiting)   . Psoriasis    severe, began soriatane 01/2012  . Rectal fissure   . Scoliosis     Past Surgical History:  Procedure Laterality Date  . benign rectal growth  2004   removed by Dr. Zella Richer  . BIOPSY  08/09/2018   Procedure: BIOPSY;  Surgeon: Carol Ada, MD;  Location: [facility] ENDOSCOPY;  Service: Endoscopy;;  . BIOPSY  08/22/2018   Procedure: BIOPSY;  Surgeon: Juanita Craver, MD;  Location: [facility] ENDOSCOPY;  Service: Endoscopy;;  . BREAST BIOPSY Right   . BREAST EXCISIONAL BIOPSY Left   . BREAST SURGERY  1988   biopsy  . CESAREAN SECTION    . COLONOSCOPY N/A  08/22/2018   Procedure: COLONOSCOPY;  Surgeon: Juanita Craver, MD;  Location: [facility] ENDOSCOPY;  Service: Endoscopy;  Laterality: N/A;  . ESOPHAGOGASTRODUODENOSCOPY (EGD) WITH PROPOFOL N/A 08/04/2018   Procedure: ESOPHAGOGASTRODUODENOSCOPY (EGD) WITH PROPOFOL;  Surgeon: Ladene Artist, MD;  Location: [place];  Service: Gastroenterology;  Laterality: N/A;  . FLEXIBLE SIGMOIDOSCOPY N/A 08/04/2018   Procedure: FLEXIBLE SIGMOIDOSCOPY;  Surgeon: Ladene Artist, MD;  Location: [place];  Service: Gastroenterology;  Laterality: N/A;  . FLEXIBLE SIGMOIDOSCOPY N/A 08/09/2018   Procedure: FLEXIBLE SIGMOIDOSCOPY;  Surgeon: Carol Ada, MD;  Location: [place];  Service: Endoscopy;  Laterality: N/A;  . VIDEO BRONCHOSCOPY WITH ENDOBRONCHIAL NAVIGATION N/A 03/14/2018   Procedure: VIDEO BRONCHOSCOPY WITH ENDOBRONCHIAL NAVIGATION;  Surgeon: Collene Gobble, MD;  Location: [place];  Service:  Thoracic;  Laterality: N/A;    Social History   Socioeconomic History  . Marital status: Single    Spouse name: Not on file  . Number of children: 1  . Years of education: 18  . Highest education level: Bachelor's degree (e.g., BA, AB, BS)  Occupational History  . Occupation: retired Careers adviser  Social Needs  . Financial resource strain: Not on file  . Food insecurity    Worry: Not on file    Inability: Not on file  . Transportation needs    Medical: Not on file    Non-medical: Not on file  Tobacco Use  . Smoking status: Former Smoker    Packs/day: 1.00    Years: 10.00    Pack years: 10.00    Quit date: 02/14/1974    Years since quitting: 44.9  . Smokeless tobacco: Never Used  Substance and Sexual Activity  . Alcohol use: No  . Drug use: No  . Sexual activity: Not on file  Lifestyle  . Physical activity    Days per week: Not on file    Minutes per session: Not on file  . Stress: Not on file  Relationships  . Social Herbalist on phone: Not on file     Gets together: Not on file    Attends religious service: Not on file    Active member of club or organization: Not on file    Attends meetings of clubs or organizations: Not on file    Relationship status: Not on file  Other Topics Concern  . Not on file  Social History Narrative   Lives alone in a one story home.  Has one child and one grandchild.  Retired Careers adviser.  Education: college.     Family History  Problem Relation Age of Onset  . Lung cancer Father   . Arthritis Other        Parents  . Asthma Other        parent, other relative  . Breast cancer Other        other relative  . Hypertension Other        parent, other relative  . Heart disease Other        parent, other relative  . Heart disease Mother   . Asthma Mother   . Breast cancer Maternal Aunt 68  . Parkinson's disease Maternal Grandmother   . Rheumatic fever Maternal Grandfather     Review of Systems  Constitutional: Positive for fatigue (weak). Negative for chills and fever.  Respiratory: Negative for cough, shortness of breath and wheezing.   Cardiovascular: Negative for chest pain and palpitations. Leg swelling: improved.  Gastrointestinal: Positive for abdominal pain (with gurgling), diarrhea, nausea and vomiting. Negative for blood in stool.  Skin: Positive for rash (face).  Neurological: Positive for light-headedness.       Objective:   Vitals:   01/23/19 1302  BP: 100/64  Pulse: 84  Resp: 16  Temp: 98.1 F (36.7 C)  SpO2: 99%   BP Readings from Last 3 Encounters:  01/23/19 100/64  01/17/19 132/70  01/16/19 134/68   Wt Readings from Last 3 Encounters:  01/23/19 112 lb 9.6 oz (51.1 kg)  01/17/19 116 lb 12.8 oz (53 kg)  01/16/19 119 lb 6.4 oz (54.2 kg)   Body mass index is 21.63 kg/m.   Physical Exam    Constitutional: chronically ill appearing. No distress.  HENT:  Head: Normocephalic and atraumatic.  Neck: Neck supple. No tracheal deviation  present. No thyromegaly present.  No cervical lymphadenopathy Cardiovascular: Normal rate, regular rhythm and normal heart sounds.   No murmur heard. No carotid bruit .  No edema Pulmonary/Chest: Effort normal and breath sounds normal. No respiratory distress. No has no wheezes. No rales.  Abdomen:  Distended, bloated- firm, mild diffuse tenderness Skin: Skin is warm and dry. Not diaphoretic.  Psychiatric: Normal mood and affect. Behavior is normal.      Assessment & Plan:    See Problem List for Assessment and Plan of chronic medical problems.

## 2019-01-23 ENCOUNTER — Encounter: Payer: Self-pay | Admitting: Internal Medicine

## 2019-01-23 ENCOUNTER — Other Ambulatory Visit: Payer: Self-pay

## 2019-01-23 ENCOUNTER — Ambulatory Visit (INDEPENDENT_AMBULATORY_CARE_PROVIDER_SITE_OTHER): Payer: Medicare Other | Admitting: Internal Medicine

## 2019-01-23 ENCOUNTER — Telehealth: Payer: Self-pay | Admitting: Rheumatology

## 2019-01-23 VITALS — BP 100/64 | HR 84 | Temp 98.1°F | Resp 16 | Ht 60.5 in | Wt 112.6 lb

## 2019-01-23 DIAGNOSIS — I10 Essential (primary) hypertension: Secondary | ICD-10-CM | POA: Diagnosis not present

## 2019-01-23 DIAGNOSIS — R197 Diarrhea, unspecified: Secondary | ICD-10-CM

## 2019-01-23 DIAGNOSIS — K219 Gastro-esophageal reflux disease without esophagitis: Secondary | ICD-10-CM | POA: Diagnosis not present

## 2019-01-23 DIAGNOSIS — K8689 Other specified diseases of pancreas: Secondary | ICD-10-CM | POA: Diagnosis not present

## 2019-01-23 DIAGNOSIS — R112 Nausea with vomiting, unspecified: Secondary | ICD-10-CM | POA: Diagnosis not present

## 2019-01-23 NOTE — Assessment & Plan Note (Addendum)
BP on low side High risk of dehydration, AKI Stop bystolic Monitor BP  - may need to decrease benicar, but for now continue current dose

## 2019-01-23 NOTE — Assessment & Plan Note (Signed)
Pancreatic elastase slightly low Continue creon

## 2019-01-23 NOTE — Assessment & Plan Note (Signed)
Chronic severe nausea, vomiting and diarrhea Not able to keep fluids, food down  - losing weight  Continue zofran Continue lomotil and imodium, creon, cholestyramine (if able to keep it down) Did not take xifaxan Dr Collene Mares will not see her anymore b/c she went to [facility] - needs a new GI doc - will refer

## 2019-01-23 NOTE — Telephone Encounter (Signed)
Faxed 01/23/2019 x 2

## 2019-01-23 NOTE — Assessment & Plan Note (Signed)
GERD controlled Continue daily medication  

## 2019-01-23 NOTE — Patient Instructions (Addendum)
A referral was ordered for Dr Lyndel Safe.     Continue your current medications.

## 2019-01-23 NOTE — Telephone Encounter (Signed)
Sharon faxed labs around 10:30am x2.  Called Dr. Laney Potash office and advised they have been faxed twice this morning.

## 2019-01-23 NOTE — Telephone Encounter (Signed)
Dr. Donnie Mesa,( [facility] [address] requesting Tacey Heap labs on patient to be sent for review. Fax# [phone].

## 2019-01-28 ENCOUNTER — Telehealth: Payer: Self-pay

## 2019-01-28 ENCOUNTER — Telehealth: Payer: Self-pay | Admitting: Gastroenterology

## 2019-01-28 NOTE — Telephone Encounter (Signed)
Patient daughter [name] would like calls to come to her since patient is a bit confused and not very coherent today. Please call her at Ph# [phone]

## 2019-01-28 NOTE — Telephone Encounter (Signed)
Spoke with daughter in regards. Documented in another phone note.

## 2019-01-28 NOTE — Telephone Encounter (Signed)
-----   Message from [name], MD sent at 01/25/2019  4:34 PM EST ----- Please call her.  This is regarding referral to GI upstairs.  Let her know because she has an appointment with [facility] lower GI would not accept her as a patient.  They would also need to review her records before excepting her.  In order to figure this out the fastest it may be worse seeing a PA at [facility] she may be able to get in as early as next week.  I agree she should see Dr., But we need someone to start looking at this immediately.  They have an appointment in [facility] may be she could be seen there.

## 2019-01-28 NOTE — Telephone Encounter (Signed)
Spoke with daughter and advised on response below. She expressed understanding. Will talk to her mom about going back to [facility].

## 2019-01-28 NOTE — Telephone Encounter (Signed)
Pt daughter  Vira Blanco) calling again and [facility] will not do anything but Virtual and they do not anything but virtual appt and she wants a referral to someone who can actually see her mom with these nausea/ Diarrhea symptoms FU again with her please, also states her mom is to sick to retreive her records. States she has to work and is not able to make this happen for her mom!

## 2019-01-28 NOTE — Telephone Encounter (Signed)
Copied from [place] [phone]. Topic: General - Other >> Jan 28, 2019  2:42 PM Mcneil, Ja-Kwan wrote: Reason for CRM: Pt stated she needs to speak with either Dr. Quay Burow or Lovena Le because she has not been able to get an appt with any GI providers in the area and she would like a call back.

## 2019-01-28 NOTE — Telephone Encounter (Signed)
Spoke with patient. Told her I would talk with referral coordinators to see what can be worked out. She was very upset and stated that she did not schedule an appointment with [facility]. She stated that she feels like she is dying and nobody wants to help her. She does not want an appointment with them because she did not want to see a resident. Cecille Rubin is going to try to get her in with [facility] and try back upstairs.

## 2019-01-28 NOTE — Telephone Encounter (Addendum)
Pt's daughter stated pt received a call from Dr. Fuller Plan at [facility] and he would not be taking pt on. They suggested she go to [facility] however pt is unable to travel to [place] and cannot do virtual appts. She would like a callback from [facility] to see what other options they may have. Please advise.[phone]

## 2019-01-28 NOTE — Telephone Encounter (Signed)
Spoke with daughter and advised that [name] with referrals called [facility] to see if they can look at pts records and have someone reach out to patient to get something scheduled. They will contact patient as soon as MD looks over records. Daughter expressed understanding.

## 2019-01-28 NOTE — Telephone Encounter (Signed)
I saw her as an inpatient consult while covering the weekend for Drs. Collene Mares and [place], which was also the circumstance of Dr. Fuller Plan performing a procedure on her in June.  As I recall, this patient has undergone extensive workup with Dr. Collene Mares.  If Dr Collene Mares will not see patient for whatever reason, then I recommend she should seek evaluation at a tertiary care center such as [facility].

## 2019-01-28 NOTE — Telephone Encounter (Signed)
I would recommend [facility] or [facility] in [place].

## 2019-01-28 NOTE — Telephone Encounter (Signed)
Good afternoon Dr. Loletha Carrow,   There is a referral from PCP Dr. Quay Burow to evaluate N/V, diarrhea and pancreatic insufficiency.  This patient had a GI consult with you 01/06/19.  She has also had a hospital EGD by Dr. Fuller Plan 08/04/18.    She was an established patient with Dr. Collene Mares but has been discharged because "she has seen other GI MDs at the hospital."  She had a hosp colon by Dr. Collene Mares 08/22/18 and most recent office visit was 01/01/19.    She was not aware of her upcoming appointments at [facility] and has cancelled the 03/03/19 appointment with Dr. Bess Kinds.  She stated, "my condition is dead and nobody wants to help me."  Due to her health, she is unable to obtain office visit notes from Dr. Collene Mares, but all procedure reports can be accessed in Epic.  Her daughter is unable to help her due to "being busy with work."  Please review records in [place] and consider seeing the patient.

## 2019-01-30 ENCOUNTER — Inpatient Hospital Stay (HOSPITAL_COMMUNITY): Payer: Medicare Other

## 2019-01-30 ENCOUNTER — Encounter (HOSPITAL_COMMUNITY): Payer: Self-pay | Admitting: Emergency Medicine

## 2019-01-30 ENCOUNTER — Inpatient Hospital Stay (HOSPITAL_COMMUNITY)
Admission: EM | Admit: 2019-01-30 | Discharge: 2019-02-05 | DRG: 640 | Disposition: A | Discharge status: Home / Self Care | Payer: Medicare Other | Attending: Internal Medicine | Admitting: Internal Medicine

## 2019-01-30 ENCOUNTER — Other Ambulatory Visit: Payer: Self-pay

## 2019-01-30 DIAGNOSIS — R1110 Vomiting, unspecified: Secondary | ICD-10-CM

## 2019-01-30 DIAGNOSIS — Z825 Family history of asthma and other chronic lower respiratory diseases: Secondary | ICD-10-CM

## 2019-01-30 DIAGNOSIS — D649 Anemia, unspecified: Secondary | ICD-10-CM | POA: Diagnosis not present

## 2019-01-30 DIAGNOSIS — K219 Gastro-esophageal reflux disease without esophagitis: Secondary | ICD-10-CM | POA: Diagnosis present

## 2019-01-30 DIAGNOSIS — N281 Cyst of kidney, acquired: Secondary | ICD-10-CM | POA: Diagnosis present

## 2019-01-30 DIAGNOSIS — K8681 Exocrine pancreatic insufficiency: Secondary | ICD-10-CM

## 2019-01-30 DIAGNOSIS — F605 Obsessive-compulsive personality disorder: Secondary | ICD-10-CM | POA: Diagnosis present

## 2019-01-30 DIAGNOSIS — E860 Dehydration: Secondary | ICD-10-CM | POA: Diagnosis present

## 2019-01-30 DIAGNOSIS — E43 Unspecified severe protein-calorie malnutrition: Secondary | ICD-10-CM | POA: Diagnosis present

## 2019-01-30 DIAGNOSIS — Z888 Allergy status to other drugs, medicaments and biological substances status: Secondary | ICD-10-CM

## 2019-01-30 DIAGNOSIS — K589 Irritable bowel syndrome without diarrhea: Secondary | ICD-10-CM | POA: Diagnosis present

## 2019-01-30 DIAGNOSIS — L409 Psoriasis, unspecified: Secondary | ICD-10-CM | POA: Diagnosis present

## 2019-01-30 DIAGNOSIS — I2510 Atherosclerotic heart disease of native coronary artery without angina pectoris: Secondary | ICD-10-CM | POA: Diagnosis present

## 2019-01-30 DIAGNOSIS — Z6821 Body mass index (BMI) 21.0-21.9, adult: Secondary | ICD-10-CM

## 2019-01-30 DIAGNOSIS — M8580 Other specified disorders of bone density and structure, unspecified site: Secondary | ICD-10-CM | POA: Diagnosis present

## 2019-01-30 DIAGNOSIS — I10 Essential (primary) hypertension: Secondary | ICD-10-CM | POA: Diagnosis present

## 2019-01-30 DIAGNOSIS — K529 Noninfective gastroenteritis and colitis, unspecified: Secondary | ICD-10-CM | POA: Diagnosis present

## 2019-01-30 DIAGNOSIS — F419 Anxiety disorder, unspecified: Secondary | ICD-10-CM | POA: Diagnosis present

## 2019-01-30 DIAGNOSIS — Z86711 Personal history of pulmonary embolism: Secondary | ICD-10-CM

## 2019-01-30 DIAGNOSIS — E119 Type 2 diabetes mellitus without complications: Secondary | ICD-10-CM | POA: Diagnosis present

## 2019-01-30 DIAGNOSIS — R64 Cachexia: Secondary | ICD-10-CM | POA: Diagnosis present

## 2019-01-30 DIAGNOSIS — E869 Volume depletion, unspecified: Secondary | ICD-10-CM | POA: Diagnosis not present

## 2019-01-30 DIAGNOSIS — J45909 Unspecified asthma, uncomplicated: Secondary | ICD-10-CM | POA: Diagnosis present

## 2019-01-30 DIAGNOSIS — N949 Unspecified condition associated with female genital organs and menstrual cycle: Secondary | ICD-10-CM

## 2019-01-30 DIAGNOSIS — E785 Hyperlipidemia, unspecified: Secondary | ICD-10-CM | POA: Diagnosis present

## 2019-01-30 DIAGNOSIS — E876 Hypokalemia: Secondary | ICD-10-CM | POA: Diagnosis not present

## 2019-01-30 DIAGNOSIS — Z7901 Long term (current) use of anticoagulants: Secondary | ICD-10-CM

## 2019-01-30 DIAGNOSIS — R197 Diarrhea, unspecified: Secondary | ICD-10-CM | POA: Diagnosis not present

## 2019-01-30 DIAGNOSIS — Z8249 Family history of ischemic heart disease and other diseases of the circulatory system: Secondary | ICD-10-CM

## 2019-01-30 DIAGNOSIS — Z20828 Contact with and (suspected) exposure to other viral communicable diseases: Secondary | ICD-10-CM | POA: Diagnosis present

## 2019-01-30 DIAGNOSIS — R112 Nausea with vomiting, unspecified: Secondary | ICD-10-CM | POA: Diagnosis not present

## 2019-01-30 DIAGNOSIS — Z885 Allergy status to narcotic agent status: Secondary | ICD-10-CM

## 2019-01-30 DIAGNOSIS — Z87891 Personal history of nicotine dependence: Secondary | ICD-10-CM

## 2019-01-30 DIAGNOSIS — M1990 Unspecified osteoarthritis, unspecified site: Secondary | ICD-10-CM | POA: Diagnosis present

## 2019-01-30 DIAGNOSIS — I959 Hypotension, unspecified: Secondary | ICD-10-CM

## 2019-01-30 DIAGNOSIS — Z86010 Personal history of colonic polyps: Secondary | ICD-10-CM

## 2019-01-30 DIAGNOSIS — Z79899 Other long term (current) drug therapy: Secondary | ICD-10-CM

## 2019-01-30 DIAGNOSIS — N179 Acute kidney failure, unspecified: Secondary | ICD-10-CM | POA: Diagnosis present

## 2019-01-30 DIAGNOSIS — N859 Noninflammatory disorder of uterus, unspecified: Secondary | ICD-10-CM

## 2019-01-30 LAB — URINALYSIS, ROUTINE W REFLEX MICROSCOPIC
Bilirubin Urine: NEGATIVE
Glucose, UA: NEGATIVE mg/dL
Hgb urine dipstick: NEGATIVE
Ketones, ur: 5 mg/dL — AB
Leukocytes,Ua: NEGATIVE
Nitrite: NEGATIVE
Protein, ur: NEGATIVE mg/dL
Specific Gravity, Urine: 1.006 (ref 1.005–1.030)
pH: 5 (ref 5.0–8.0)

## 2019-01-30 LAB — CBC WITH DIFFERENTIAL/PLATELET
Abs Immature Granulocytes: 0.04 10*3/uL (ref 0.00–0.07)
Basophils Absolute: 0 10*3/uL (ref 0.0–0.1)
Basophils Relative: 0 %
Eosinophils Absolute: 0 10*3/uL (ref 0.0–0.5)
Eosinophils Relative: 0 %
HCT: 42.7 % (ref 36.0–46.0)
Hemoglobin: 13.4 g/dL (ref 12.0–15.0)
Immature Granulocytes: 0 %
Lymphocytes Relative: 14 %
Lymphs Abs: 1.4 10*3/uL (ref 0.7–4.0)
MCH: 30.5 pg (ref 26.0–34.0)
MCHC: 31.4 g/dL (ref 30.0–36.0)
MCV: 97 fL (ref 80.0–100.0)
Monocytes Absolute: 0.6 10*3/uL (ref 0.1–1.0)
Monocytes Relative: 6 %
Neutro Abs: 8 10*3/uL — ABNORMAL HIGH (ref 1.7–7.7)
Neutrophils Relative %: 80 %
Platelets: 296 10*3/uL (ref 150–400)
RBC: 4.4 MIL/uL (ref 3.87–5.11)
RDW: 14.6 % (ref 11.5–15.5)
WBC: 10.1 10*3/uL (ref 4.0–10.5)
nRBC: 0 % (ref 0.0–0.2)

## 2019-01-30 LAB — COMPREHENSIVE METABOLIC PANEL
ALT: 12 U/L (ref 0–44)
AST: 13 U/L — ABNORMAL LOW (ref 15–41)
Albumin: 2.5 g/dL — ABNORMAL LOW (ref 3.5–5.0)
Alkaline Phosphatase: 59 U/L (ref 38–126)
Anion gap: 10 (ref 5–15)
BUN: 48 mg/dL — ABNORMAL HIGH (ref 8–23)
CO2: 13 mmol/L — ABNORMAL LOW (ref 22–32)
Calcium: 7.7 mg/dL — ABNORMAL LOW (ref 8.9–10.3)
Chloride: 112 mmol/L — ABNORMAL HIGH (ref 98–111)
Creatinine, Ser: 4.07 mg/dL — ABNORMAL HIGH (ref 0.44–1.00)
GFR calc Af Amer: 12 mL/min — ABNORMAL LOW (ref >60)
GFR calc non Af Amer: 10 mL/min — ABNORMAL LOW (ref >60)
Glucose, Bld: 69 mg/dL — ABNORMAL LOW (ref 70–99)
Potassium: 4.7 mmol/L (ref 3.5–5.1)
Sodium: 135 mmol/L (ref 135–145)
Total Bilirubin: 0.5 mg/dL (ref 0.3–1.2)
Total Protein: 5.3 g/dL — ABNORMAL LOW (ref 6.5–8.1)

## 2019-01-30 LAB — LIPASE, BLOOD: Lipase: 17 U/L (ref 11–51)

## 2019-01-30 MED ORDER — SODIUM CHLORIDE 0.9 % IV BOLUS
1000.0000 mL | Freq: Once | INTRAVENOUS | Status: AC
  Administered 2019-01-30: 1000 mL via INTRAVENOUS

## 2019-01-30 MED ORDER — HEPARIN (PORCINE) 25000 UT/250ML-% IV SOLN
750.0000 [IU]/h | INTRAVENOUS | Status: DC
  Administered 2019-01-31: 800 [IU]/h via INTRAVENOUS
  Filled 2019-01-30: qty 250

## 2019-01-30 MED ORDER — ONDANSETRON HCL 4 MG/2ML IJ SOLN
4.0000 mg | Freq: Four times a day (QID) | INTRAMUSCULAR | Status: DC | PRN
  Administered 2019-01-31 – 2019-02-05 (×13): 4 mg via INTRAVENOUS
  Filled 2019-01-30 (×13): qty 2

## 2019-01-30 MED ORDER — METOCLOPRAMIDE HCL 5 MG/ML IJ SOLN
10.0000 mg | Freq: Once | INTRAMUSCULAR | Status: AC
  Administered 2019-01-30: 10 mg via INTRAVENOUS
  Filled 2019-01-30: qty 2

## 2019-01-30 MED ORDER — DIPHENHYDRAMINE HCL 50 MG/ML IJ SOLN
12.5000 mg | Freq: Once | INTRAMUSCULAR | Status: AC
  Administered 2019-01-30: 12.5 mg via INTRAVENOUS
  Filled 2019-01-30: qty 1

## 2019-01-30 MED ORDER — SODIUM BICARBONATE-DEXTROSE 150-5 MEQ/L-% IV SOLN
150.0000 meq | INTRAVENOUS | Status: DC
  Administered 2019-01-31: 150 meq via INTRAVENOUS
  Filled 2019-01-30 (×2): qty 1000

## 2019-01-30 MED ORDER — ONDANSETRON HCL 4 MG/2ML IJ SOLN: 4.0000 mg | Freq: Once | INTRAMUSCULAR | Status: DC

## 2019-01-30 MED ORDER — DIPHENOXYLATE-ATROPINE 2.5-0.025 MG PO TABS
1.0000 | ORAL_TABLET | Freq: Three times a day (TID) | ORAL | Status: DC
  Administered 2019-01-31 – 2019-02-05 (×15): 1 via ORAL
  Filled 2019-01-30 (×16): qty 1

## 2019-01-30 NOTE — ED Provider Notes (Addendum)
[place] EMERGENCY DEPARTMENT Provider Note   CSN: [id number] Arrival date & time: 01/30/19  1619     History Chief Complaint  Patient presents with  . Weakness    [name] is a 73 y.o. female.  Pt presents to the ED today with n/v/d.  The pt has had constant diarrhea for months.  She has had multiple GI consults and multiple work ups.  She has had multiple admissions for dehydration due to diarrhea.  The pt has been taking lomotil, imodium, questran for her sx.  She has been put on Creon for possible pancreatic insufficiency.  She was admitted to [facility] earlier this month (d/c'd on 12/7) for another GI opinion.  The pt said stool is just coming out of her.  She said the zofran is no longer helping her nausea and vomiting.  She has not been able to keep anything down.         Past Medical History:  Diagnosis Date  . ALLERGIC RHINITIS   . ANEMIA-NOS   . Arthritis   . ASTHMA   . Carpal tunnel syndrome   . COLONIC POLYPS, HX OF   . Coronary artery disease    "mild CAD" noted on 12/05/17 in coronary CT scan  . DIABETES MELLITUS, TYPE II    diet controlled  . GERD   . HYPERLIPIDEMIA   . HYPERTENSION   . OSTEOPENIA   . PONV (postoperative nausea and vomiting)   . Psoriasis    severe, began soriatane 01/2012  . Rectal fissure   . Scoliosis     Patient Active Problem List   Diagnosis Date Noted  . Hyponatremia 01/04/2019  . Inflammatory arthritis 01/02/2019  . Positive ANA (antinuclear antibody) 01/02/2019  . Pancreatic insufficiency 10/23/2018  . Multiple pulmonary nodules determined by computed tomography of lung 09/18/2018  . Decreased GFR 08/21/2018  . Nausea vomiting and diarrhea 08/20/2018  . Syncope 08/01/2018  . Hypokalemia 08/01/2018  . Abnormal CT scan, pelvis 07/31/2018  . Pneumothorax 03/14/2018  . Lung abnormality 01/11/2018  . Pulmonary embolism ([place]) 12/24/2017  . Lung mass 12/24/2017  . 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 Atypical chest pain 06/14/2017  . Dizziness 06/14/2017  . Hyperuricemia 05/03/2017  . Arthralgia 05/01/2017  . Hair loss 05/01/2017  . Vitamin D deficiency 10/31/2016  . Diarrhea 06/06/2016  . Diastolic dysfunction 25/00/3704  . Vertigo 12/24/2015  . Nonspecific abnormal electrocardiogram (ECG) (EKG) 12/24/2015  . Bilateral carotid artery disease, Mild 05/31/2015  . Thyroid nodule 05/31/2015  . Psoriasis   . Scoliosis   . Diabetes type 2, controlled ([place]) 06/03/2008  . Dyslipidemia 06/03/2008  . CARPAL TUNNEL SYNDROME 06/03/2008  . HTN (hypertension) 06/03/2008  . ALLERGIC RHINITIS 06/03/2008  . Asthma 06/03/2008  . GERD (gastroesophageal reflux disease) 06/03/2008  . Osteopenia 06/03/2008  . COLONIC POLYPS, HX OF 06/03/2008    Past Surgical History:  Procedure Laterality Date  . benign rectal growth  2004   removed by Dr. Zella Richer  . BIOPSY  08/09/2018   Procedure: BIOPSY;  Surgeon: Carol Ada, MD;  Location: [facility] ENDOSCOPY;  Service: Endoscopy;;  . BIOPSY  08/22/2018   Procedure: BIOPSY;  Surgeon: Juanita Craver, MD;  Location: [facility] ENDOSCOPY;  Service: Endoscopy;;  . BREAST BIOPSY Right   . BREAST EXCISIONAL BIOPSY Left   . BREAST SURGERY  1988   biopsy  . CESAREAN SECTION    . COLONOSCOPY N/A 08/22/2018   Procedure: COLONOSCOPY;  Surgeon: Juanita Craver, MD;  Location: [facility] ENDOSCOPY;  Service: Endoscopy;  Laterality: N/A;  . ESOPHAGOGASTRODUODENOSCOPY (EGD) WITH PROPOFOL N/A 08/04/2018   Procedure: ESOPHAGOGASTRODUODENOSCOPY (EGD) WITH PROPOFOL;  Surgeon: Ladene Artist, MD;  Location: [place];  Service: Gastroenterology;  Laterality: N/A;  . FLEXIBLE SIGMOIDOSCOPY N/A 08/04/2018   Procedure: FLEXIBLE SIGMOIDOSCOPY;  Surgeon: Ladene Artist, MD;  Location: [place];  Service: Gastroenterology;  Laterality: N/A;  . FLEXIBLE SIGMOIDOSCOPY N/A 08/09/2018   Procedure: FLEXIBLE SIGMOIDOSCOPY;  Surgeon: Carol Ada, MD;  Location: [place];  Service: Endoscopy;  Laterality: N/A;  . VIDEO BRONCHOSCOPY WITH ENDOBRONCHIAL NAVIGATION N/A 03/14/2018   Procedure: VIDEO BRONCHOSCOPY WITH ENDOBRONCHIAL NAVIGATION;  Surgeon: Collene Gobble, MD;  Location: [facility] OR;  Service: Thoracic;  Laterality: N/A;     OB History   No obstetric history on file.     Family History  Problem Relation Age of Onset  . Lung cancer Father   . Arthritis Other        Parents  . Asthma Other        parent, other relative  . Breast cancer Other        other relative  . Hypertension Other        parent, other relative  . Heart disease Other        parent, other relative  . Heart disease Mother   . Asthma Mother   . Breast cancer Maternal Aunt 68  . Parkinson's disease Maternal Grandmother   . Rheumatic fever Maternal Grandfather     Social History   Tobacco Use  . Smoking status: Former Smoker    Packs/day: 1.00    Years: 10.00    Pack years: 10.00    Quit date: 02/14/1974    Years since quitting: 44.9  . Smokeless tobacco: Never Used  Substance Use Topics  . Alcohol use: No  . Drug use: No    Home Medications Prior to Admission medications   Medication Sig Start Date End Date Taking? Authorizing Provider  BENICAR 20 MG tablet Take one tablet daily 12/19/18  Yes Burns, Claudina Lick, MD  Calcium Carbonate Antacid (TUMS CHEWY BITES PO) Take 1 tablet by mouth daily as needed (For heartburn). After each meal    Yes [provider]  clindamycin (CLEOCIN T) 1 % external solution Apply to small bumps and arms and legs 01/14/19  Yes [provider]  clobetasol (TEMOVATE) 0.05 % external solution Apply 1 application topically every morning.  10/08/18 10/08/19 Yes [provider]  diphenoxylate-atropine (LOMOTIL) 2.5-0.025 MG tablet Take 1 tablet by mouth 3 (three) times daily.  01/21/19  Yes [provider]  halobetasol (ULTRAVATE) 0.05 % cream Apply 1 application topically 2 (two) times daily.  10/08/18  Yes  [provider]  omeprazole (PRILOSEC) 20 MG capsule Take 20 mg by mouth daily.   Yes [provider]  ondansetron (ZOFRAN ODT) 8 MG disintegrating tablet Take 1 tablet (8 mg total) by mouth every 8 (eight) hours as needed for nausea or vomiting. 01/17/19  Yes Burns, Claudina Lick, MD  ONETOUCH ULTRA test strip USE TEST STRIPS TO CHECK BLOOD SUGAR AT LEAST 3 TIMES DAILY 11/28/18  Yes Burns, Claudina Lick, MD  Rivaroxaban (XARELTO) 15 MG TABS tablet Take 15 mg by mouth daily with supper.   Yes [provider]  Flurandrenolide 4 MCG/SQCM TAPE Apply topically daily as needed 01/14/19   [provider]    Allergies    Phenergan [promethazine hcl], Amlodipine, Clarithromycin, Codeine, Erythromycin, Statins,  and Tetracycline  Review of Systems   Review of Systems  Gastrointestinal: Positive for diarrhea, nausea and vomiting.  All other systems reviewed and are negative.   Physical Exam Updated Vital Signs BP (!) 113/57   Pulse 78   Temp 97.6 F (36.4 C) (Oral)   Resp 17   Ht 5' (1.524 m)   Wt 51 kg   SpO2 99%   BMI 21.96 kg/m   Physical Exam Vitals and nursing note reviewed.  Constitutional:      Appearance: Normal appearance.  HENT:     Head: Normocephalic and atraumatic.     Right Ear: External ear normal.     Left Ear: External ear normal.     Nose: Nose normal.     Mouth/Throat:     Mouth: Mucous membranes are dry.  Eyes:     Extraocular Movements: Extraocular movements intact.     Conjunctiva/sclera: Conjunctivae normal.     Pupils: Pupils are equal, round, and reactive to light.  Cardiovascular:     Rate and Rhythm: Normal rate and regular rhythm.     Pulses: Normal pulses.     Heart sounds: Normal heart sounds.  Pulmonary:     Effort: Pulmonary effort is normal.     Breath sounds: Normal breath sounds.  Abdominal:     General: Abdomen is flat. Bowel sounds are normal.     Palpations: Abdomen is soft.  Musculoskeletal:        General:  Normal range of motion.     Cervical back: Normal range of motion and neck supple.  Skin:    General: Skin is warm.     Capillary Refill: Capillary refill takes less than 2 seconds.  Neurological:     General: No focal deficit present.     Mental Status: She is alert and oriented to person, place, and time.  Psychiatric:        Mood and Affect: Mood normal.        Behavior: Behavior normal.        Thought Content: Thought content normal.        Judgment: Judgment normal.     ED Results / Procedures / Treatments   Labs (all labs ordered are listed, but only abnormal results are displayed) Labs Reviewed  COMPREHENSIVE METABOLIC PANEL - Abnormal; Notable for the following components:      Result Value   Chloride 112 (*)    CO2 13 (*)    Glucose, Bld 69 (*)    BUN 48 (*)    Creatinine, Ser 4.07 (*)    Calcium 7.7 (*)    Total Protein 5.3 (*)    Albumin 2.5 (*)    AST 13 (*)    GFR calc non Af Amer 10 (*)    GFR calc Af Amer 12 (*)    All other components within normal limits  CBC WITH DIFFERENTIAL/PLATELET - Abnormal; Notable for the following components:   Neutro Abs 8.0 (*)    All other components within normal limits  SARS CORONAVIRUS 2 (TAT 6-24 HRS)  LIPASE, BLOOD  CBC WITH DIFFERENTIAL/PLATELET  URINALYSIS, ROUTINE W REFLEX MICROSCOPIC   Labs from [facility] admission:  Lab Results  Component Value Date/Time  NA 137 01/21/2019 0301  K 4.0 01/21/2019 0301  CL 111 (H) 01/21/2019 0301  CO2 17 (L) 01/21/2019 0301  BUN 8 01/21/2019 0301  GLU 81 01/21/2019 0301  CREATININE 0.97 01/21/2019 0301  PROT 6.4 01/21/2019 0301  ALBUMIN 3.5 01/21/2019  0301  BILITOT 0.5 01/21/2019 0301  ALKPHOS 73 01/21/2019 0301  AST 13 01/21/2019 0301  ALT 11 01/21/2019 0301  ANIONGAP 9 01/21/2019 0301  GFRNONAA 57 (L) 01/21/2019 0301   EKG EKG Interpretation  Date/Time:  Wednesday January 30 2019 20:53:07 EST Ventricular Rate:  79 PR Interval:    QRS Duration: 103 QT  Interval:  387 QTC Calculation: 444 R Axis:   -6 Text Interpretation: Sinus rhythm No significant change since last tracing Confirmed by Isla Pence [phone]) on 01/30/2019 8:57:10 PM   Radiology No results found.  Procedures Procedures (including critical care time)  Medications Ordered in ED Medications  sodium chloride 0.9 % bolus 1,000 mL (0 mLs Intravenous Stopped 01/30/19 1908)  metoCLOPramide (REGLAN) injection 10 mg (10 mg Intravenous Given 01/30/19 1811)  diphenhydrAMINE (BENADRYL) injection 12.5 mg (12.5 mg Intravenous Given 01/30/19 1811)  sodium chloride 0.9 % bolus 1,000 mL (1,000 mLs Intravenous New Bag/Given 01/30/19 2040)    ED Course  I have reviewed the triage vital signs and the nursing notes.  Pertinent labs & imaging results that were available during my care of the patient were reviewed by me and considered in my medical decision making (see chart for details).    MDM Rules/Calculators/A&P                      Cr 0.97 on 12/7.  Now her Cr is 4.  Pt is significantly dehydrated.  She is given IVFs and d/w triad (Dr. Marthenia Rolling)  for admission.  CRITICAL CARE Performed by: Isla Pence   Total critical care time: 30 minutes  Critical care time was exclusive of separately billable procedures and treating other patients.  Critical care was necessary to treat or prevent imminent or life-threatening deterioration.  Critical care was time spent personally by me on the following activities: development of treatment plan with patient and/or surrogate as well as nursing, discussions with consultants, evaluation of patient's response to treatment, examination of patient, obtaining history from patient or surrogate, ordering and performing treatments and interventions, ordering and review of laboratory studies, ordering and review of radiographic studies, pulse oximetry and re-evaluation of patient's condition.     Final Clinical Impression(s) / ED  Diagnoses Final diagnoses:  Dehydration  Diarrhea, unspecified type  AKI (acute kidney injury) [facility])    Rx / [place] Orders ED Discharge Orders    None       Isla Pence, MD 01/30/19 2056    Isla Pence, MD 01/30/19 2057

## 2019-01-30 NOTE — ED Triage Notes (Signed)
Pt arrives POV with multiple complaints which include weakness, nausea, vomiting and diarrhea since 07/2018. Pt A/O X4

## 2019-01-30 NOTE — ED Notes (Signed)
Patient transported to X-ray 

## 2019-01-30 NOTE — Progress Notes (Signed)
[place] for heparin Indication: history of PE on Xarelto PTA  Heparin Dosing Weight: 51 kg  Labs: Recent Labs    01/30/19 1720 01/30/19 1906  HGB 13.4  --   HCT 42.7  --   PLT 296  --   CREATININE  --  4.07*    Assessment: 37 yof with history of PE on Xarelto presenting with weakness, N/V/D. Pharmacy consulted to transition to heparin drip for now with patient found to have severe AKI with SCr 4.07 on admit. CBC wnl. No active bleeding issues documented.  Last dose of Xarelto documented as 12/15 PTA at 2000 per med rec. Ok to start heparin drip now, as has been >24 hours since last Xarelto dose. Noted - home dose is 15mg  daily. This should likely be increased to to 20mg  daily on discharge for indication of PE.  Goal of Therapy:  Heparin level 0.3-0.7 units/ml aPTT 66-102 seconds Monitor platelets by anticoagulation protocol: Yes   Plan:  Baseline aPTT/heparin level No bolus with recent Xarelto. Start heparin at 800 units/h 8h aPTT Monitor daily heparin level/aPTT/CBC, s/sx bleeding   Elicia Lamp, PharmD, BCPS Clinical Pharmacist 01/30/2019 11:12 PM

## 2019-01-30 NOTE — H&P (Signed)
History and Physical  [name] GDJ:[id number] DOB: 06/20/1945 DOA: 01/30/2019  Referring physician: ER provider PCP: [name], MD  Outpatient Specialists: Patient was seen Dr. Collene Mares, GI provider.  Dr. Collene Mares may have discontinued physician patient relationship with this patient. Patient coming from: Home  Chief Complaint: Worsening diarrhea, nausea vomiting  HPI:  Patient is a 73 year old lady with past medical history significant for chronic diarrhea, unprovoked pulmonary embolism that was diagnosed in 2019 on Xarelto, hypertension, hyperlipidemia, documented diabetes mellitus and coronary artery disease, colonic polyp and anemia.  Patient started having diarrhea in June of this year.  Essentially, patient has continued to have diarrhea since June, has been admitted to this hospital 3 times, last admission being in November 2020.  Patient was discharged from [facility] on 01/21/2019 after 3-day stay for worsening diarrhea.  On discharge, diarrhea continued to get worse, as well as nausea and vomiting.  According to the patient, she has not been able to keep anything down.  Patient has had significant weight loss.  No headache, no neck pain, no chest pain, no shortness of breath, no fever, no urinary symptoms.  Patient reports feeling cold all the time.  Significantly, on presentation to the hospital, BUN was 48 with serum creatinine of 4.07 (up from 0.97 on 01/17/2019), with CO2 of 13 and normal anion gap.  There is also gaseous distention of the abdomen, with no associated pain or discomfort.  Hospitalist team has been asked to admit patient for further assessment and management.  ED Course: On presentation to the hospital, she was 97.5, blood pressure 110/57, heart rate of 81, respiratory rate of 18 and O2 sat of 100%.  Work-up done is essentially as above.  Patient be admitted for further assessment and management of acute kidney injury.  Pertinent labs: As documented  above.  EKG: Independently reviewed.   Review of Systems:  Negative for fever, visual changes, sore throat, rash, new muscle aches, chest pain, SOB, dysuria, bleeding  Past Medical History:  Diagnosis Date  . ALLERGIC RHINITIS   . ANEMIA-NOS   . Arthritis   . ASTHMA   . Carpal tunnel syndrome   . COLONIC POLYPS, HX OF   . Coronary artery disease    "mild CAD" noted on 12/05/17 in coronary CT scan  . DIABETES MELLITUS, TYPE II    diet controlled  . GERD   . HYPERLIPIDEMIA   . HYPERTENSION   . OSTEOPENIA   . PONV (postoperative nausea and vomiting)   . Psoriasis    severe, began soriatane 01/2012  . Rectal fissure   . Scoliosis     Past Surgical History:  Procedure Laterality Date  . benign rectal growth  2004   removed by Dr. Zella Richer  . BIOPSY  08/09/2018   Procedure: BIOPSY;  Surgeon: Carol Ada, MD;  Location: [facility] ENDOSCOPY;  Service: Endoscopy;;  . BIOPSY  08/22/2018   Procedure: BIOPSY;  Surgeon: Juanita Craver, MD;  Location: [facility] ENDOSCOPY;  Service: Endoscopy;;  . BREAST BIOPSY Right   . BREAST EXCISIONAL BIOPSY Left   . BREAST SURGERY  1988   biopsy  . CESAREAN SECTION    . COLONOSCOPY N/A 08/22/2018   Procedure: COLONOSCOPY;  Surgeon: Juanita Craver, MD;  Location: [facility] ENDOSCOPY;  Service: Endoscopy;  Laterality: N/A;  . ESOPHAGOGASTRODUODENOSCOPY (EGD) WITH PROPOFOL N/A 08/04/2018   Procedure: ESOPHAGOGASTRODUODENOSCOPY (EGD) WITH PROPOFOL;  Surgeon: Ladene Artist, MD;  Location: [place];  Service: Gastroenterology;  Laterality: N/A;  . FLEXIBLE  SIGMOIDOSCOPY N/A 08/04/2018   Procedure: FLEXIBLE SIGMOIDOSCOPY;  Surgeon: Ladene Artist, MD;  Location: [place];  Service: Gastroenterology;  Laterality: N/A;  . FLEXIBLE SIGMOIDOSCOPY N/A 08/09/2018   Procedure: FLEXIBLE SIGMOIDOSCOPY;  Surgeon: Carol Ada, MD;  Location: [place];  Service: Endoscopy;  Laterality: N/A;  . VIDEO BRONCHOSCOPY WITH ENDOBRONCHIAL NAVIGATION N/A 03/14/2018   Procedure:  VIDEO BRONCHOSCOPY WITH ENDOBRONCHIAL NAVIGATION;  Surgeon: Collene Gobble, MD;  Location: [place];  Service: Thoracic;  Laterality: N/A;     reports that she quit smoking about 44 years ago. She has a 10.00 pack-year smoking history. She has never used smokeless tobacco. She reports that she does not drink alcohol or use drugs.  Allergies  Allergen Reactions  . Phenergan [Promethazine Hcl] Shortness Of Breath and Anxiety  . Amlodipine Swelling  . Clarithromycin Nausea Only    I can pass out   . Codeine Nausea Only    I can pass out   . Erythromycin     Other reaction(s): Vomiting (intolerance)  . Statins     Muscle pain   . Tetracycline Other (See Comments)    unknown    Family History  Problem Relation Age of Onset  . Lung cancer Father   . Arthritis Other        Parents  . Asthma Other        parent, other relative  . Breast cancer Other        other relative  . Hypertension Other        parent, other relative  . Heart disease Other        parent, other relative  . Heart disease Mother   . Asthma Mother   . Breast cancer Maternal Aunt 68  . Parkinson's disease Maternal Grandmother   . Rheumatic fever Maternal Grandfather      Prior to Admission medications   Medication Sig Start Date End Date Taking? Authorizing Provider  BENICAR 20 MG tablet Take one tablet daily 12/19/18  Yes Burns, Claudina Lick, MD  clindamycin (CLEOCIN T) 1 % external solution Apply 1 application topically daily.  01/14/19  Yes [provider]  clobetasol (TEMOVATE) 0.05 % external solution Apply 1 application topically every morning.  10/08/18 10/08/19 Yes [provider]  diphenoxylate-atropine (LOMOTIL) 2.5-0.025 MG tablet Take 1 tablet by mouth 3 (three) times daily.  01/21/19  Yes [provider]  halobetasol (ULTRAVATE) 0.05 % cream Apply 1 application topically 2 (two) times daily.  10/08/18  Yes [provider]  omeprazole (PRILOSEC) 20 MG capsule Take 20 mg by  mouth daily.   Yes [provider]  ondansetron (ZOFRAN ODT) 8 MG disintegrating tablet Take 1 tablet (8 mg total) by mouth every 8 (eight) hours as needed for nausea or vomiting. 01/17/19  Yes Burns, Claudina Lick, MD  ONETOUCH ULTRA test strip USE TEST STRIPS TO CHECK BLOOD SUGAR AT LEAST 3 TIMES DAILY 11/28/18  Yes Burns, Claudina Lick, MD  Rivaroxaban (XARELTO) 15 MG TABS tablet Take 15 mg by mouth daily with supper.   Yes [provider]    Physical Exam: Vitals:   01/30/19 2045 01/30/19 2100 01/30/19 2115 01/30/19 2130  BP: (!) 113/57 (!) 107/56 108/63 (!) 110/57  Pulse: 78 77 79 81  Resp: 17 16 13 18   Temp:      TempSrc:      SpO2: 99% 100% 99% 100%  Weight:      Height:  Constitutional:  . Appears calm and comfortable.  Cachectic. Eyes:  . No pallor. No jaundice.  ENMT:  . external ears, nose appear normal Neck:  . Neck is supple. No JVD Respiratory:  . CTA bilaterally, no w/r/r.  . Respiratory effort normal. No retractions or accessory muscle use Cardiovascular:  . S1S2 . No LE extremity edema   Abdomen:  . Abdomen is gaseously distended, soft and non tender. Organs are difficult to assess. Neurologic:  . Awake and alert. . Moves all limbs.  Wt Readings from Last 3 Encounters:  01/30/19 51 kg  01/23/19 51.1 kg  01/17/19 53 kg    I have personally reviewed following labs and imaging studies  Labs on Admission:  CBC: Recent Labs  Lab 01/30/19 1720  WBC 10.1  NEUTROABS 8.0*  HGB 13.4  HCT 42.7  MCV 97.0  PLT 616   Basic Metabolic Panel: Recent Labs  Lab 01/30/19 1906  NA 135  K 4.7  CL 112*  CO2 13*  GLUCOSE 69*  BUN 48*  CREATININE 4.07*  CALCIUM 7.7*   Liver Function Tests: Recent Labs  Lab 01/30/19 1906  AST 13*  ALT 12  ALKPHOS 59  BILITOT 0.5  PROT 5.3*  ALBUMIN 2.5*   Recent Labs  Lab 01/30/19 1906  LIPASE 17   No results for input(s): AMMONIA in the last 168 hours. Coagulation Profile: No results for  input(s): INR, PROTIME in the last 168 hours. Cardiac Enzymes: No results for input(s): CKTOTAL, CKMB, CKMBINDEX, TROPONINI in the last 168 hours. BNP (last 3 results) No results for input(s): PROBNP in the last 8760 hours. HbA1C: No results for input(s): HGBA1C in the last 72 hours. CBG: No results for input(s): GLUCAP in the last 168 hours. Lipid Profile: No results for input(s): CHOL, HDL, LDLCALC, TRIG, CHOLHDL, LDLDIRECT in the last 72 hours. Thyroid Function Tests: No results for input(s): TSH, T4TOTAL, FREET4, T3FREE, THYROIDAB in the last 72 hours. Anemia Panel: No results for input(s): VITAMINB12, FOLATE, FERRITIN, TIBC, IRON, RETICCTPCT in the last 72 hours. Urine analysis:    Component Value Date/Time   COLORURINE YELLOW 01/30/2019 2204   APPEARANCEUR HAZY (A) 01/30/2019 2204   LABSPEC 1.006 01/30/2019 2204   PHURINE 5.0 01/30/2019 2204   GLUCOSEU NEGATIVE 01/30/2019 2204   GLUCOSEU NEGATIVE 10/13/2009 0843   HGBUR NEGATIVE 01/30/2019 2204   BILIRUBINUR NEGATIVE 01/30/2019 2204   KETONESUR 5 (A) 01/30/2019 2204   PROTEINUR NEGATIVE 01/30/2019 2204   UROBILINOGEN 0.2 10/13/2009 0843   NITRITE NEGATIVE 01/30/2019 2204   LEUKOCYTESUR NEGATIVE 01/30/2019 2204   Sepsis Labs: @LABRCNTIP (procalcitonin:4,lacticidven:4) )No results found for this or any previous visit (from the past 240 hour(s)).    Radiological Exams on Admission: No results found.  EKG: Independently reviewed.   Active Problems:   Acute kidney injury (HCC)   Assessment/Plan Acute kidney injury: Admit patient for further assessment and management Aggressively hydrate patient Work-up AKI (renal ultrasound, urine sodium, ANA, C3 and C4) Avoid nephrotoxic medications Dose all medications assuming GFR of less than 10 mils per minute Hold Xarelto for now.  Patient may need to be bridged with heparin. Further management will depend on above  Chronic diarrhea: -Please consult GI in the morning  (may be a worth contacting Dr. Collene Mares, GI consultant out of courtesy to confirm the current status of patient physician relationship) -Stool analysis, including GI panel, fecal lactoferrin and ova and parasites -Obtain recent work-up done at [facility]  Nausea and vomiting: -Supportive care for now. -  Continue IV Zofran -Continue hydration  Gaseous distention of the abdomen: Abdominal x-ray Further management depend on hospital course  History of unprovoked pulmonary embolism in 2019: -According to the patient, hematology team has advised continuing Xarelto. -Due to severe AKI, will hold Xarelto for now. -Consider heparin drip bridge.  Will consult pharmacy  Hypertension: -Patient was hypotensive on presentation, with documented blood pressure 74/56 mmHg -Keep MAP greater than 65 mmHg -Avoid hypotension -Discontinue nephrotoxic antihypertensives -Further management will depend on hospital course   DVT prophylaxis: Consider heparin bridge Code Status: Full code Family Communication:  Disposition Plan: Home eventually Consults called: Please call GI in the morning Admission status: Inpatient  Time spent: 65 minutes  Dana Allan, MD  [facility] Pager #: [phone] 7PM-7AM contact night coverage as above  01/30/2019, 11:06 PM

## 2019-01-31 ENCOUNTER — Ambulatory Visit: Payer: Medicare Other | Admitting: Rheumatology

## 2019-01-31 DIAGNOSIS — N179 Acute kidney failure, unspecified: Secondary | ICD-10-CM

## 2019-01-31 DIAGNOSIS — K8681 Exocrine pancreatic insufficiency: Secondary | ICD-10-CM

## 2019-01-31 DIAGNOSIS — E43 Unspecified severe protein-calorie malnutrition: Secondary | ICD-10-CM | POA: Insufficient documentation

## 2019-01-31 DIAGNOSIS — R197 Diarrhea, unspecified: Secondary | ICD-10-CM

## 2019-01-31 LAB — PROTEIN / CREATININE RATIO, URINE
Creatinine, Urine: 60.01 mg/dL
Protein Creatinine Ratio: 0.4 mg/mg{Cre} — ABNORMAL HIGH (ref 0.00–0.15)
Total Protein, Urine: 24 mg/dL

## 2019-01-31 LAB — HEPARIN LEVEL (UNFRACTIONATED)
Heparin Unfractionated: 0.18 IU/mL — ABNORMAL LOW (ref 0.30–0.70)
Heparin Unfractionated: 0.28 IU/mL — ABNORMAL LOW (ref 0.30–0.70)

## 2019-01-31 LAB — CBC
HCT: 33.8 % — ABNORMAL LOW (ref 36.0–46.0)
Hemoglobin: 11 g/dL — ABNORMAL LOW (ref 12.0–15.0)
MCH: 30.7 pg (ref 26.0–34.0)
MCHC: 32.5 g/dL (ref 30.0–36.0)
MCV: 94.4 fL (ref 80.0–100.0)
Platelets: 279 10*3/uL (ref 150–400)
RBC: 3.58 MIL/uL — ABNORMAL LOW (ref 3.87–5.11)
RDW: 14.6 % (ref 11.5–15.5)
WBC: 6.5 10*3/uL (ref 4.0–10.5)
nRBC: 0 % (ref 0.0–0.2)

## 2019-01-31 LAB — COMPREHENSIVE METABOLIC PANEL
ALT: 12 U/L (ref 0–44)
ALT: 13 U/L (ref 0–44)
AST: 19 U/L (ref 15–41)
AST: 15 U/L (ref 15–41)
Albumin: 2.7 g/dL (ref 3.5–5.0)
Albumin: 2.6 g/dL — ABNORMAL LOW (ref 3.5–5.0)
Alkaline Phosphatase: 71 U/L (ref 38–126)
Alkaline Phosphatase: 67 U/L (ref 38–126)
Anion gap: 14 (ref 5–15)
Anion gap: 11 (ref 5–15)
BUN: 45 mg/dL (ref 8–23)
BUN: 38 mg/dL — ABNORMAL HIGH (ref 8–23)
CO2: 11 mmol/L (ref 22–32)
CO2: 17 mmol/L — ABNORMAL LOW (ref 22–32)
Calcium: 8 mg/dL (ref 8.9–10.3)
Calcium: 8.2 mg/dL — ABNORMAL LOW (ref 8.9–10.3)
Chloride: 112 mmol/L (ref 98–111)
Chloride: 112 mmol/L — ABNORMAL HIGH (ref 98–111)
Creatinine, Ser: 3.61 mg/dL (ref 0.44–1.00)
Creatinine, Ser: 2.95 mg/dL — ABNORMAL HIGH (ref 0.44–1.00)
GFR calc Af Amer: 14 mL/min — ABNORMAL LOW (ref >60)
GFR calc Af Amer: 17 mL/min — ABNORMAL LOW (ref >60)
GFR calc non Af Amer: 12 mL/min — ABNORMAL LOW (ref >60)
GFR calc non Af Amer: 15 mL/min — ABNORMAL LOW (ref >60)
Glucose, Bld: 78 mg/dL (ref 70–99)
Glucose, Bld: 105 mg/dL — ABNORMAL HIGH (ref 70–99)
Potassium: 4.4 mmol/L (ref 3.5–5.1)
Potassium: 3.7 mmol/L (ref 3.5–5.1)
Sodium: 137 mmol/L (ref 135–145)
Sodium: 140 mmol/L (ref 135–145)
Total Bilirubin: UNDETERMINED mg/dL (ref 0.3–1.2)
Total Bilirubin: 0.6 mg/dL (ref 0.3–1.2)
Total Protein: 5.6 g/dL (ref 6.5–8.1)
Total Protein: 5.5 g/dL — ABNORMAL LOW (ref 6.5–8.1)

## 2019-01-31 LAB — SARS CORONAVIRUS 2 (TAT 6-24 HRS): SARS Coronavirus 2: NEGATIVE

## 2019-01-31 LAB — PHOSPHORUS: Phosphorus: 3.9 mg/dL (ref 2.5–4.6)

## 2019-01-31 LAB — APTT
aPTT: 35 seconds (ref 24–36)
aPTT: 83 seconds — ABNORMAL HIGH (ref 24–36)
aPTT: 104 s — ABNORMAL HIGH (ref 24–36)

## 2019-01-31 LAB — LACTOFERRIN, FECAL, QUALITATIVE: Lactoferrin, Fecal, Qual: POSITIVE — AB

## 2019-01-31 LAB — MAGNESIUM
Magnesium: 0.9 mg/dL — CL (ref 1.7–2.4)
Magnesium: 1.7 mg/dL (ref 1.7–2.4)

## 2019-01-31 LAB — SODIUM, URINE, RANDOM: Sodium, Ur: 35 mmol/L

## 2019-01-31 MED ORDER — LOPERAMIDE HCL 2 MG PO CAPS
2.0000 mg | ORAL_CAPSULE | Freq: Four times a day (QID) | ORAL | Status: DC
  Administered 2019-01-31 – 2019-02-05 (×18): 2 mg via ORAL
  Filled 2019-01-31 (×19): qty 1

## 2019-01-31 MED ORDER — BOOST / RESOURCE BREEZE PO LIQD CUSTOM
1.0000 | Freq: Three times a day (TID) | ORAL | Status: DC
  Administered 2019-01-31 – 2019-02-01 (×2): 1 via ORAL

## 2019-01-31 MED ORDER — SODIUM BICARBONATE-DEXTROSE 150-5 MEQ/L-% IV SOLN
150.0000 meq | INTRAVENOUS | Status: DC
  Administered 2019-01-31 – 2019-02-01 (×3): 150 meq via INTRAVENOUS
  Filled 2019-01-31 (×2): qty 1000

## 2019-01-31 MED ORDER — MAGNESIUM SULFATE 2 GM/50ML IV SOLN
2.0000 g | Freq: Once | INTRAVENOUS | Status: AC
  Administered 2019-01-31: 2 g via INTRAVENOUS
  Filled 2019-01-31: qty 50

## 2019-01-31 MED ORDER — ADULT MULTIVITAMIN W/MINERALS CH
1.0000 | ORAL_TABLET | Freq: Every day | ORAL | Status: DC
  Administered 2019-01-31: 1 via ORAL
  Filled 2019-01-31 (×5): qty 1

## 2019-01-31 NOTE — Progress Notes (Signed)
PROGRESS NOTE                                                                                                                                                                                                             Patient Demographics:    [name], is a 73 y.o. female, DOB - 1946-01-27, ZDG:[id number]  Admit date - 01/30/2019   Admitting Physician Bonnell Public, MD  Outpatient Primary MD for the patient is Caitlin Rail, MD  LOS - 1   Chief Complaint  Patient presents with  . Weakness       Brief Narrative    Patient is a 73 year old lady with past medical history significant for chronic diarrhea, unprovoked pulmonary embolism that was diagnosed in 2019 on Xarelto, hypertension, hyperlipidemia, documented diabetes mellitus and coronary artery disease, colonic polyp and anemia.  Patient started having diarrhea in June of this year.  Essentially, patient has continued to have diarrhea since June, has been admitted to this hospital 3 times, last admission being in November 2020.  Patient was discharged from [facility] on 01/21/2019 after 3-day stay for worsening diarrhea.  On discharge, diarrhea continued to get worse, as well as nausea and vomiting.  According to the patient, she has not been able to keep anything down.  Patient has had significant weight loss.  No headache, no neck pain, no chest pain, no shortness of breath, no fever, no urinary symptoms.  Patient reports feeling cold all the time.  Significantly, on presentation to the hospital, BUN was 48 with serum creatinine of 4.07 (up from 0.97 on 01/17/2019), with CO2 of 13 and normal anion gap.  There is also gaseous distention of the abdomen, with no associated pain or discomfort.  Hospitalist team has been asked to admit patient for further assessment and management.  ED Course: On presentation to the hospital, she was 97.5, blood pressure 110/57, heart rate of 81,  respiratory rate of 18 and O2 sat of 100%.  Work-up done is essentially as above.  Patient be admitted for further assessment and management of acute kidney injury.   Subjective:    [name] today still reports some nausea, no vomiting, still diarrhea, and mild abdominal pain.   Assessment  & Plan :    Active Problems:   Acute kidney injury ([place])   Acute  kidney injury -Baseline creatinine less than 1, significantly elevated at 4.07 on discharge. -This is most likely in the setting of this is in the setting of prerenal acidemia, and possibly ATN given episode of hypotension on presentation and clinical dehydration. -Improving with IV fluids, it is 2.9 this morning. -Avoid Nephrotoxic medication -Continue with IV fluid, continue with low bicarb, but will lower the rate as her bicarb level improved from 13> 17. -Hold Xarelto for now.  Patient may need to be bridged with heparin.   Chronic diarrhea: -Appears to be chronic problem, requiring multiple admissions , and is not following any more with GI Dr. Collene Mares, she had recent hospitalization at [facility], she was supposed to follow-up with [place] as an outpatient, but she currently has arranged with Dr. Thana Farr GI in [place] . - follow on Stool analysis, including GI panel, fecal lactoferrin and ova and parasites -recent work-up done at [facility], results on care everywhere  Nausea and vomiting: -Supportive care for now. -Continue IV Zofran -Continue hydration  History of unprovoked pulmonary embolism in 2019: -According to the patient, hematology team has advised continuing Xarelto. -Due to severe AKI, will hold Xarelto for now.  Continue with heparin drip meanwhiley  Hypertension: -Patient was hypotensive on presentation, with documented blood pressure 74/56 mmHg -Continue to hold antihypertensive regiment, continue with IV fluids    COVID-19 Labs  No results for  input(s): DDIMER, FERRITIN, LDH, CRP in the last 72 hours.  Lab Results  Component Value Date   SARSCOV2NAA NEGATIVE 01/30/2019   [place] NEGATIVE 01/04/2019   [place] NEGATIVE 08/21/2018   SARSCOV2NAA NOT DETECTED 08/01/2018     Code Status : Full  Family Communication  : D/W patient  Disposition Plan  : Home  Barriers For Discharge : Remains on IV fluid, renal function far from baseline  Consults  :  GI  Procedures  : None  DVT Prophylaxis  :  Heparin GTT  Lab Results  Component Value Date   PLT 279 01/31/2019    Antibiotics  :    Anti-infectives (From admission, onward)   None        Objective:   Vitals:   01/31/19 0230 01/31/19 0300 01/31/19 0300 01/31/19 0945  BP:   123/62 114/60  Pulse: 75  78 72  Resp: 13  18 18   Temp:   98.9 F (37.2 C) 97.8 F (36.6 C)  TempSrc:   Oral Oral  SpO2: 96%  99% 94%  Weight:   49.6 kg   Height:  5' 1.5" (1.562 m)      Wt Readings from Last 3 Encounters:  01/31/19 49.6 kg  01/23/19 51.1 kg  01/17/19 53 kg     Intake/Output Summary (Last 24 hours) at 01/31/2019 1208 Last data filed at 01/31/2019 0757 Gross per 24 hour  Intake 626.17 ml  Output 451 ml  Net 175.17 ml     Physical Exam  Awake Alert, Oriented X 3, No new F.N deficits, Normal affect Symmetrical Chest wall movement, Good air movement bilaterally, CTAB RRR,No Gallops,Rubs or new Murmurs, No Parasternal Heave +ve B.Sounds, Abd Soft, No tenderness,  No rebound - guarding or rigidity. No Cyanosis, Clubbing or edema, No new Rash or bruise      Data Review:    CBC Recent Labs  Lab 01/30/19 1720 01/31/19 0333  WBC 10.1 6.5  HGB 13.4 11.0*  HCT 42.7 33.8*  PLT 296 279  MCV 97.0 94.4  MCH 30.5  30.7  MCHC 31.4 32.5  RDW 14.6 14.6  LYMPHSABS 1.4  --   MONOABS 0.6  --   EOSABS 0.0  --   BASOSABS 0.0  --     Chemistries  Recent Labs  Lab 01/30/19 1906 01/31/19 0102 01/31/19 0333 01/31/19 0927  NA 135  --  137 140  K 4.7   --  4.4 3.7  CL 112*  --  112 112*  CO2 13*  --  11 17*  GLUCOSE 69*  --  78 105*  BUN 48*  --  45 38*  CREATININE 4.07*  --  3.61 2.95*  CALCIUM 7.7*  --  8.0 8.2*  MG  --  0.9*  --  1.7  AST 13*  --  19 15  ALT 12  --  12 13  ALKPHOS 59  --  71 67  BILITOT 0.5  --  QUANTITY NOT SUFFICIENT, UNABLE TO PERFORM TEST 0.6   ------------------------------------------------------------------------------------------------------------------ No results for input(s): CHOL, HDL, LDLCALC, TRIG, CHOLHDL, LDLDIRECT in the last 72 hours.  Lab Results  Component Value Date   HGBA1C 5.8 12/19/2018   ------------------------------------------------------------------------------------------------------------------ No results for input(s): TSH, T4TOTAL, T3FREE, THYROIDAB in the last 72 hours.  Invalid input(s): FREET3 ------------------------------------------------------------------------------------------------------------------ No results for input(s): VITAMINB12, FOLATE, FERRITIN, TIBC, IRON, RETICCTPCT in the last 72 hours.  Coagulation profile No results for input(s): INR, PROTIME in the last 168 hours.  No results for input(s): DDIMER in the last 72 hours.  Cardiac Enzymes No results for input(s): CKMB, TROPONINI, MYOGLOBIN in the last 168 hours.  Invalid input(s): CK ------------------------------------------------------------------------------------------------------------------    Component Value Date/Time   BNP 40.2 12/23/2017 2339    Inpatient Medications  Scheduled Meds: . diphenoxylate-atropine  1 tablet Oral TID   Continuous Infusions: . heparin 800 Units/hr (01/31/19 0134)  . sodium bicarbonate 150 mEq in dextrose 5% 1000 mL 150 mEq (01/31/19 0816)   PRN Meds:.ondansetron (ZOFRAN) IV  Micro Results Recent Results (from the past 240 hour(s))  SARS CORONAVIRUS 2 (TAT 6-24 HRS) Nasopharyngeal Nasopharyngeal Swab     Status: None   Collection Time: 01/30/19  9:28 PM    Specimen: Nasopharyngeal Swab  Result Value Ref Range Status   SARS Coronavirus 2 NEGATIVE NEGATIVE Final    Comment: (NOTE) SARS-CoV-2 target nucleic acids are NOT DETECTED. The SARS-CoV-2 RNA is generally detectable in upper and lower respiratory specimens during the acute phase of infection. Negative results do not preclude SARS-CoV-2 infection, do not rule out co-infections with other pathogens, and should not be used as the sole basis for treatment or other patient management decisions. Negative results must be combined with clinical observations, patient history, and epidemiological information. The expected result is Negative. Fact Sheet for Patients: SugarRoll.be Fact Sheet for Healthcare Providers: https://www.woods-mathews.com/ This test is not yet approved or cleared by the [place] FDA and  has been authorized for detection and/or diagnosis of SARS-CoV-2 by FDA under an Emergency Use Authorization (EUA). This EUA will remain  in effect (meaning this test can be used) for the duration of the COVID-19 declaration under Section 56 4(b)(1) of the Act, 21 U.S.C. section 360bbb-3(b)(1), unless the authorization is terminated or revoked sooner. Performed at [facility], [place] [address] [place], [place] [address]     Radiology Reports US Renal  Result Date: 01/31/2019 CLINICAL DATA:  Acute kidney injury. EXAM: RENAL / URINARY TRACT ULTRASOUND COMPLETE COMPARISON:  CT 08/21/2018 FINDINGS: Right Kidney: Renal measurements: 9.0 x 4.4 x 3.7 cm =  volume: 77 mL. Echogenicity within normal limits. Cyst in the lateral mid kidney measures 2.2 x 1.8 x 1.9 cm. This is unchanged in size from prior exam. No solid mass or hydronephrosis visualized. Left Kidney: Renal measurements: 9.6 x 4.1 x 3.0 cm = volume: 63 mL. Echogenicity within normal limits. No mass or hydronephrosis visualized. Bladder: Appears normal for degree of bladder  distention. Other: None. IMPRESSION: 1. No hydronephrosis or obstructive uropathy. 2. Right renal cyst. Electronically Signed   By: Keith Rake M.D.   On: 01/31/2019 00:18   DG Abd 2 Views  Result Date: 01/31/2019 CLINICAL DATA:  Diarrhea and nausea. EXAM: ABDOMEN - 2 VIEW COMPARISON:  CT 08/21/2018 FINDINGS: No bowel dilatation to suggest obstruction. No free intra-abdominal air. No radiopaque calculi or abnormal soft tissue calcifications. Lung bases are clear. Scoliotic curvature of the spine with multilevel degenerative change. IMPRESSION: Nonobstructive bowel gas pattern.  No free air. Electronically Signed   By: Keith Rake M.D.   On: 01/31/2019 00:04     Phillips Climes M.D on 01/31/2019 at 12:08 PM  Between 7am to 7pm - Pager - [phone]  After 7pm go to [email or web address] - password [facility]  [facility] -  Office  [phone]

## 2019-01-31 NOTE — Progress Notes (Signed)
[place] for heparin Indication: history of PE on Xarelto PTA  Heparin Dosing Weight: 51 kg  Labs: Recent Labs    01/30/19 1720 01/30/19 1906 01/31/19 0102 01/31/19 0333 01/31/19 0927  HGB 13.4  --   --  11.0*  --   HCT 42.7  --   --  33.8*  --   PLT 296  --   --  279  --   APTT  --   --  35  --  83*  HEPARINUNFRC  --   --  0.18* 0.28*  --   CREATININE  --  4.07*  --  3.61 2.95*    Assessment: 73 yo F with history of PE on Xarelto presenting with weakness, N/V/D. Pharmacy consulted to transition to heparin drip for now with patient found to have severe AKI with SCr 4.07 on admit. CBC wnl. No active bleeding issues documented.  Last dose of Xarelto documented as 12/15 PTA at 2000 per med rec.  Monitoring heparin using aPTT given recent Xarelto use.  aPTT therapeutic on heparin at 800 units/hr.  No bleeding noted.  SCr with noted improvement.  Goal of Therapy:  Heparin level 0.3-0.7 units/ml aPTT 66-102 seconds Monitor platelets by anticoagulation protocol: Yes   Plan:  Continue heparin at 800 units/h 8h aPTT for confirmation Monitor daily heparin level/aPTT/CBC, s/sx bleeding Anticipate can monitor using heparin levels on 12/18.   [employer], Pharm.D., BCPS Clinical Pharmacist Clinical phone for 01/31/2019 from 8:30-4:00 is [phone].  **Pharmacist phone directory can be found on [email or web address] listed under [place].  01/31/2019 10:42 AM

## 2019-01-31 NOTE — Progress Notes (Signed)
New Admission Note:   Arrival Method: Arrived from [facility] ED via stretcher Mental Orientation: Alert and oriented x4 Telemetry: N/A Assessment: Completed Skin: See doc flowsheet IV:Rt AC Pain: Denies Tubes: N/A Safety Measures: Safety Fall Prevention Plan has been discussed.  Admission: Completed 5MW Orientation: Patient has been orientated to the room, unit and staff.  Family: None at bedside  Orders have been reviewed and implemented. Will continue to monitor the patient. Call light has been placed within reach and bed alarm has been activated.   Harim [employer], RN-BC Phone number: [phone]

## 2019-01-31 NOTE — ED Notes (Signed)
Date and time results received: 01/31/19 0220 (use smartphrase ".now" to insert current time)  Test: magnesium Critical Value: 0.9  Name of Provider Notified: Baltazar Najjar  Orders Received? Or Actions Taken?: Actions Taken: Waiting for answer to page

## 2019-01-31 NOTE — Progress Notes (Addendum)
[name]: 12:56 PM 01/31/2019  LOS: 1 day    Referring Provider: Dr. Waldron Labs Primary Care Physician:  Caitlin Rail, MD Primary Gastroenterologist:  Dr Collene Mares >> [facility] Dr Earlean Shawl has appt for 02/13/19  Reason for Consultation:  Chronic diarrhea.     HPI: [name] is a 73 y.o. female.  PMH psoriasis.  Diet controlled DM 2.  Mild CAD.  Arthritis.  Anemia.  Polyarthralgia.  S/p C section in 1970s.  Extensive workup for diarrhea by Dr Collene Mares, seen and had procedures as inpt by [facility] (when covering for [place]). 08/04/2018 Flex sigmoidoscopy: Perianal skin skin tags, left-sided diverticulosis, internal hemorrhoids, otherwise normal study. 08/04/18 EGD.  Normal esophagus.  Gastritis, multiple gastric polyps.  Small hiatal hernia.  Single duodenal polyp.  No biopsies were performed due to presence of anticoagulation. 08/09/2018 flexible sigmoidoscopy: Normal.  Biopsies obtained were normal..  Solid stool encountered. 08/22/2018 Colonoscopy w biopsies.  Poor prep requiring aggressive lavage for study.  Decreased mucosal vascular pattern throughout the colon.  Random colon biopsies obtained.  A few sigmoid diverticula.  Normal terminal ileum.. Path: No significant pathologic abnormalities, no active inflammation Has carried diagnosis of IBS and pancreatic exocrine insufficiency.  EGD, colonoscopy, CTAP in 08/2018 unrevealing.  Random colon biopsies negative for microscopic colitis.  Had some beneficial response, improvement of diarrhea, with Creon 36 units with each meal.  However diarrhea recurred and bid Lomotil added.  C. difficile screen, GI pathogen panel negative in mid 12/2018. Has required admissions for management of diarrhea associated with AKI.  Most recent  admission was the third week of 02/2018 after abrupt recurrence of diarrhea a week PTA.  She had been started on prednisone for rheumatic issues starting at 3 and later increased to 10 mg/daily associated with the onset of the diarrhea but by the time she was admitted she had been off it for nearly a week.  She had never started prescribed Plaquenil. Dr. Lorie Apley last note of 01/12/19 mentions patient doing better with increased stool frequency with combination of increased pancreatic enzymes and cholestyramine.  Dr. Collene Mares stated "As I have mentioned before I feel this is multifactorial in addition to patient's IBS as she has an obsessive compulsive personality and I am quite convinced that she has a component of IBS.Marland Kitchen She has been advised to continue her pancreatic enzyme supplements when she gets goes home she is prime for discharge today new prescription with a higher dose will be called into her pharmacy on Monday avoidance of use of all artificial sugars including Splenda and Stevia etc. has been encouraged as there is a risk of osmotic diarrhea with these agents. I have answered all of the questions she has to the best of my ability.  I told her a pancreatic transplant was not in the plans for the treatment of her condition that seems to be responding to enzyme supplements."  Patient has failed to respond to gluten-free and dairy free diet.  12/4 -01/21/2019 [facility] admission for dehydration, AKI associated with  diarrhea despite increased Creon, Questran, Lomotil, Imodium..  GI ordered multiple tests: 5-HIAA, gastrin level, somatostatin, chromogranin A, fecal elastase, calprotectin but recommendation was to continue Creon, Imodium, Lomotil, Zofran prn.  Did not recommend or pursue any further endoscopic/colonoscopic evaluations. After discharge tests resulted:  Fecal pancreatic elastase level low at 157(>200 normal).  Urinary 5 HIAA negative.  Serum gastrin level 122 (normal <100).  Chromogranin A, S 696  (normal <93).  CMV PCR negative/not detected.  C. difficile antigen and toxin negative.  Fecal calprotectin 757 (normal <50).   TTG was negative in 10/2018  Dr. Collene Mares terminated her relationship with the patient and patient is now followed at  [facility] and has pending appointment with Dr. Earlean Shawl.  Patient says that she never really had much improvement of her diarrhea during recent hospitalization and she continues to have 5-7 watery stools daily.  Some nocturnal symptoms.  Diarrhea increased with p.o.  Stools generally brown but in the last few days they have been greenish/yellow and contain some sediment.  She has persistent nausea though it is intermittent.  On days when she vomits she may vomit 2-3 times material consisting of undigested food but no blood or coffee-ground material. No significant abdominal pain.  No fevers, chills.  Current weight is about 109  #, she recalls her baseline of about 140  #several months ago.  Compliant taking Imodium 2-3 times day, Lomotil 2-3 times a day, Zofran 8 mg TID, Omeprazole 20 mg/day.  Not using cholestyramine or pancrease which she says were ineffective.   The diarrhea started in early June.  Prior to that she had daily, formed, brown bowel movements with occasional constipation.    Patient readmitted yesterday with increased diarrhea, nausea, vomiting.  AKI has improved with hydration.  Lipase, LFTs, WBCs normal.  COVID-19 negative.  Fecal lactoferrin positive.  Stool O&P, stool path panel pending. Renal ultrasound cyst on right kidney 2 view abdominal films show nonobstructive bowel gas pattern, no free air.  So far has had 2 bowel movements today.    Past Medical History:  Diagnosis Date  . ALLERGIC RHINITIS   . ANEMIA-NOS   . Arthritis   . ASTHMA   . Carpal tunnel syndrome   . COLONIC POLYPS, HX OF   . Coronary artery disease    "mild CAD" noted on 12/05/17 in coronary CT scan  . DIABETES MELLITUS, TYPE II    diet  controlled  . GERD   . HYPERLIPIDEMIA   . HYPERTENSION   . OSTEOPENIA   . PONV (postoperative nausea and vomiting)   . Psoriasis    severe, began soriatane 01/2012  . Rectal fissure   . Scoliosis     Past Surgical History:  Procedure Laterality Date  . benign rectal growth  2004   removed by Dr. Zella Richer  . BIOPSY  08/09/2018   Procedure: BIOPSY;  Surgeon: Carol Ada, MD;  Location: [facility] ENDOSCOPY;  Service: Endoscopy;;  . BIOPSY  08/22/2018   Procedure: BIOPSY;  Surgeon: Juanita Craver, MD;  Location: [facility] ENDOSCOPY;  Service: Endoscopy;;  . BREAST BIOPSY Right   . BREAST EXCISIONAL BIOPSY Left   . BREAST SURGERY  1988   biopsy  . CESAREAN SECTION    . COLONOSCOPY N/A 08/22/2018   Procedure: COLONOSCOPY;  Surgeon: Juanita Craver, MD;  Location: [facility] ENDOSCOPY;  Service: Endoscopy;  Laterality: N/A;  . ESOPHAGOGASTRODUODENOSCOPY (EGD) WITH PROPOFOL N/A 08/04/2018   Procedure: ESOPHAGOGASTRODUODENOSCOPY (EGD) WITH PROPOFOL;  Surgeon: Ladene Artist, MD;  Location: [facility] ENDOSCOPY;  Service: Gastroenterology;  Laterality: N/A;  . FLEXIBLE SIGMOIDOSCOPY N/A 08/04/2018   Procedure: FLEXIBLE SIGMOIDOSCOPY;  Surgeon: Ladene Artist, MD;  Location: [place];  Service: Gastroenterology;  Laterality: N/A;  . FLEXIBLE SIGMOIDOSCOPY N/A 08/09/2018   Procedure: FLEXIBLE SIGMOIDOSCOPY;  Surgeon: Carol Ada, MD;  Location: [place];  Service: Endoscopy;  Laterality: N/A;  . VIDEO BRONCHOSCOPY WITH ENDOBRONCHIAL NAVIGATION N/A 03/14/2018   Procedure: VIDEO BRONCHOSCOPY WITH ENDOBRONCHIAL NAVIGATION;  Surgeon: Collene Gobble, MD;  Location: [facility] OR;  Service: Thoracic;  Laterality: N/A;    Prior to Admission medications   Medication Sig Start Date End Date Taking? Authorizing Provider  BENICAR 20 MG tablet Take one tablet daily 12/19/18  Yes Burns, Claudina Lick, MD  clindamycin (CLEOCIN T) 1 % external solution Apply 1 application topically daily.  01/14/19  Yes [provider]  clobetasol  (TEMOVATE) 0.05 % external solution Apply 1 application topically every morning.  10/08/18 10/08/19 Yes [provider]  diphenoxylate-atropine (LOMOTIL) 2.5-0.025 MG tablet Take 1 tablet by mouth 3 (three) times daily.  01/21/19  Yes [provider]  halobetasol (ULTRAVATE) 0.05 % cream Apply 1 application topically 2 (two) times daily.  10/08/18  Yes [provider]  omeprazole (PRILOSEC) 20 MG capsule Take 20 mg by mouth daily.   Yes [provider]  ondansetron (ZOFRAN ODT) 8 MG disintegrating tablet Take 1 tablet (8 mg total) by mouth every 8 (eight) hours as needed for nausea or vomiting. 01/17/19  Yes Burns, Claudina Lick, MD  ONETOUCH ULTRA test strip USE TEST STRIPS TO CHECK BLOOD SUGAR AT LEAST 3 TIMES DAILY 11/28/18  Yes Burns, Claudina Lick, MD  Rivaroxaban (XARELTO) 15 MG TABS tablet Take 15 mg by mouth daily with supper.   Yes [provider]    Scheduled Meds: . diphenoxylate-atropine  1 tablet Oral TID   Infusions: . heparin 800 Units/hr (01/31/19 0134)  . sodium bicarbonate 150 mEq in dextrose 5% 1000 mL 150 mEq (01/31/19 0816)   PRN Meds: ondansetron (ZOFRAN) IV   Allergies as of 01/30/2019 - Review Complete 01/30/2019  Allergen Reaction Noted  . Phenergan [promethazine hcl] Shortness Of Breath and Anxiety 08/02/2018  . Amlodipine Swelling 05/30/2017  . Clarithromycin Nausea Only   . Codeine Nausea Only   . Erythromycin  08/13/2012  . Statins  12/28/2012  . Tetracycline Other (See Comments)     Family History  Problem Relation Age of Onset  . Lung cancer Father   . Arthritis Other        Parents  . Asthma Other        parent, other relative  . Breast cancer Other        other relative  . Hypertension Other        parent, other relative  . Heart disease Other        parent, other relative  . Heart disease Mother   . Asthma Mother   . Breast cancer Maternal Aunt 68  . Parkinson's disease Maternal Grandmother   . Rheumatic  fever Maternal Grandfather     Social History   Socioeconomic History  . Marital status: Single    Spouse name: Not on file  . Number of children: 1  . Years of education: 74  . Highest education level: Bachelor's degree (e.g., BA, AB, BS)  Occupational History  . Occupation: retired Careers adviser  Tobacco Use  . Smoking status: Former Smoker  Packs/day: 1.00    Years: 10.00    Pack years: 10.00    Quit date: 02/14/1974    Years since quitting: 44.9  . Smokeless tobacco: Never Used  Substance and Sexual Activity  . Alcohol use: No  . Drug use: No  . Sexual activity: Not on file  Other Topics Concern  . Not on file  Social History Narrative   Lives alone in a one story home.  Has one child and one grandchild.  Retired Careers adviser.  Education: college.    Social Determinants of Health   Financial Resource Strain:   . Difficulty of Paying Living Expenses: Not on file  Food Insecurity:   . Worried About Charity fundraiser in the Last Year: Not on file  . Ran Out of Food in the Last Year: Not on file  Transportation Needs:   . Lack of Transportation (Medical): Not on file  . Lack of Transportation (Non-Medical): Not on file  Physical Activity:   . Days of Exercise per Week: Not on file  . Minutes of Exercise per Session: Not on file  Stress:   . Feeling of Stress : Not on file  Social Connections:   . Frequency of Communication with Friends and Family: Not on file  . Frequency of Social Gatherings with Friends and Family: Not on file  . Attends Religious Services: Not on file  . Active Member of Clubs or Organizations: Not on file  . Attends Archivist Meetings: Not on file  . Marital Status: Not on file  Intimate Partner Violence:   . Fear of Current or Ex-Partner: Not on file  . Emotionally Abused: Not on file  . Physically Abused: Not on file  . Sexually Abused: Not on file    REVIEW OF  SYSTEMS: Constitutional: Fatigue ENT:  No nose bleeds Pulm: Cough or shortness of breath CV:  No palpitations, no LE edema.  No angina GU:  No hematuria, no frequency GI: See HPI Heme: No unusual bleeding or bruising. Transfusions: None in records. Neuro:  No headaches, no peripheral tingling or numbness.  Syncope. 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  Endocrine:  No sweats or chills.  No polyuria or dysuria Immunization: Reviewed Travel:  None beyond local counties in last few months.    PHYSICAL EXAM: Vital signs in last 24 hours: Vitals:   01/31/19 0300 01/31/19 0945  BP: 123/62 114/60  Pulse: 78 72  Resp: 18 18  Temp: 98.9 F (37.2 C) 97.8 F (36.6 C)  SpO2: 99% 94%   Wt Readings from Last 3 Encounters:  01/31/19 49.6 kg  01/23/19 51.1 kg  01/17/19 53 kg    General: Pale, comfortable appearing, somewhat tired and chronically ill-appearing WF who is alert Head: No facial asymmetry or swelling.  No signs of head trauma. Eyes: No scleral icterus.  No conjunctival pallor.  EOMI. Ears: Not hard of hearing Nose: No congestion, no discharge. Mouth: Good dentition.  Oral mucosa pink, moist, clear.  Tongue midline. Neck: No JVD, no masses, no thyromegaly. Lungs: Lear bilaterally, excellent breath sounds.  No labored breathing or cough Heart: RRR.  No MRG.  S1, S2 present. Abdomen: Soft.  Minimal if any tenderness which is not focal.  Active bowel sounds.  No distention.  No HSM, masses, bruits, hernias..   Rectal: Deferred  Musc/Skeltl: No joint redness, swelling or gross deformities. Extremities: Nonpitting edema at the top of her left foot, very slight nonpitting edema in the right foot. Neurologic: Oriented x3.  Good historian.  Fluid speech.  No tremors, no limb weakness. Skin: No telangiectasia, no rash, no sores. Nodes: No cervical  adenopathy Psych: Cooperative, calm.  Intake/Output from previous day: 12/16 0701 - 12/17 0700 In: 626.2 [I.V.:576.2; IV Piggyback:50] Out: 451 [Urine:450; Stool:1] Intake/Output this shift: No intake/output data recorded.  LAB RESULTS: Recent Labs    01/30/19 1720 01/31/19 0333  WBC 10.1 6.5  HGB 13.4 11.0*  HCT 42.7 33.8*  PLT 296 279   BMET Lab Results  Component Value Date   NA 140 01/31/2019   NA 137 01/31/2019   NA 135 01/30/2019   K 3.7 01/31/2019   K 4.4 01/31/2019   K 4.7 01/30/2019   CL 112 (H) 01/31/2019   CL 112 01/31/2019   CL 112 (H) 01/30/2019   CO2 17 (L) 01/31/2019   CO2 11 01/31/2019   CO2 13 (L) 01/30/2019   GLUCOSE 105 (H) 01/31/2019   GLUCOSE 78 01/31/2019   GLUCOSE 69 (L) 01/30/2019   BUN 38 (H) 01/31/2019   BUN 45 01/31/2019   BUN 48 (H) 01/30/2019   CREATININE 2.95 (H) 01/31/2019   CREATININE 3.61 01/31/2019   CREATININE 4.07 (H) 01/30/2019   CALCIUM 8.2 (L) 01/31/2019   CALCIUM 8.0 01/31/2019   CALCIUM 7.7 (L) 01/30/2019   LFT Recent Labs    01/30/19 1906 01/31/19 0333 01/31/19 0927  PROT 5.3* 5.6 5.5*  ALBUMIN 2.5* 2.7 2.6*  AST 13* 19 15  ALT 12 12 13   ALKPHOS 59 71 67  BILITOT 0.5 QUANTITY NOT SUFFICIENT, UNABLE TO PERFORM TEST 0.6   PT/INR Lab Results  Component Value Date   INR 1.00 03/14/2018   INR 0.92 12/18/2015   Hepatitis Panel No results for input(s): HEPBSAG, HCVAB, HEPAIGM, HEPBIGM in the last 72 hours. C-Diff No components found for: CDIFF Lipase     Component Value Date/Time   LIPASE 17 01/30/2019 1906    Drugs of Abuse  No results found for: LABOPIA, COCAINSCRNUR, LABBENZ, AMPHETMU, THCU, LABBARB   RADIOLOGY STUDIES: US Renal  Result Date: 01/31/2019 CLINICAL DATA:  Acute kidney injury. EXAM: RENAL / URINARY TRACT ULTRASOUND COMPLETE COMPARISON:  CT 08/21/2018 FINDINGS: Right Kidney: Renal measurements: 9.0 x 4.4 x 3.7 cm = volume: 77 mL. Echogenicity within normal limits. Cyst in the lateral  mid kidney measures 2.2 x 1.8 x 1.9 cm. This is unchanged in size from prior exam. No solid mass or hydronephrosis visualized. Left Kidney: Renal measurements: 9.6 x 4.1 x 3.0 cm = volume: 63 mL. Echogenicity within normal limits. No mass or hydronephrosis visualized. Bladder: Appears normal for degree of bladder distention. Other: None. IMPRESSION: 1. No hydronephrosis or obstructive uropathy. 2. Right renal cyst. Electronically Signed   By: Keith Rake M.D.   On: 01/31/2019 00:18   DG Abd 2 Views  Result Date: 01/31/2019 CLINICAL DATA:  Diarrhea and nausea. EXAM: ABDOMEN - 2 VIEW COMPARISON:  CT 08/21/2018 FINDINGS: No bowel dilatation to suggest obstruction. No free intra-abdominal air. No radiopaque calculi or abnormal soft tissue calcifications. Lung bases are clear. Scoliotic curvature of the spine with multilevel degenerative change. IMPRESSION: Nonobstructive bowel gas pattern.  No free air. Electronically Signed   By: Keith Rake M.D.   On: 01/31/2019 00:04     IMPRESSION:   *  Chronic diarrhea, with elevated Chromogrannin,  fecal lactoferrin, fecal calprotectin, Serum gastrin. Negative colon biopsies this summer 2020.  Gastric polyps, duodenal polyp at EGD this summer, were not biopsied or removed due to presence of anticoagulation. Rule out neuroendocrine tumor.   PLAN:     *    After discussing situation with Dr. Ilda Foil from radiology, the best imaging study to rule out neuroendocrine tumors is a PET scan gallium-68 dotatate study.  This can only be performed as an outpatient.   For now will defer decision as to pursuing this test to GI Dr. Earlean Shawl.  *  Stool path panel studies pndg but doubt infectious etiology given chronicity of sxs and previous C diff negative x 2, and 3 other negative GI path panels.  .    *  Restart immodium qid.     Azucena Freed  01/31/2019, 12:56 PM Phone [phone]

## 2019-01-31 NOTE — ED Notes (Signed)
ED TO INPATIENT HANDOFF REPORT  ED Nurse Name and Phone #: [phone] Lauree Chandler., RN  S Name/Age/Gender [name] 73 y.o. female Room/Bed: 027C/027C  Code Status   Code Status: Full Code  Home/SNF/Other Home Patient oriented to: self, place, time and situation Is this baseline? Yes   Triage Complete: Triage complete  Chief Complaint Acute kidney injury [facility]) [N17.9]  Triage Note Pt arrives POV with multiple complaints which include weakness, nausea, vomiting and diarrhea since 07/2018. Pt A/O X4    Allergies Allergies  Allergen Reactions  . Phenergan [Promethazine Hcl] Shortness Of Breath and Anxiety  . Amlodipine Swelling  . Clarithromycin Nausea Only    I can pass out   . Codeine Nausea Only    I can pass out   . Erythromycin     Other reaction(s): Vomiting (intolerance)  . Statins     Muscle pain   . Tetracycline Other (See Comments)    unknown    Level of Care/Admitting Diagnosis ED Disposition    ED Disposition Condition [facility] Area: [place] [[id number]]  Level of Care: Med-Surg [16]  Covid Evaluation: Asymptomatic Screening Protocol (No Symptoms)  Diagnosis: Acute kidney injury [facility]) [[id number]]  Admitting Physician: Shirlean Mylar  Attending Physician: Dana Allan I [[id number]]  Estimated length of stay: 3 - 4 days  Certification:: I certify this patient will need inpatient services for at least 2 midnights       B Medical/Surgery History Past Medical History:  Diagnosis Date  . ALLERGIC RHINITIS   . ANEMIA-NOS   . Arthritis   . ASTHMA   . Carpal tunnel syndrome   . COLONIC POLYPS, HX OF   . Coronary artery disease    "mild CAD" noted on 12/05/17 in coronary CT scan  . DIABETES MELLITUS, TYPE II    diet controlled  . GERD   . HYPERLIPIDEMIA   . HYPERTENSION   . OSTEOPENIA   . PONV (postoperative nausea and vomiting)   . Psoriasis    severe, began soriatane 01/2012  . Rectal  fissure   . Scoliosis    Past Surgical History:  Procedure Laterality Date  . benign rectal growth  2004   removed by Dr. Zella Richer  . BIOPSY  08/09/2018   Procedure: BIOPSY;  Surgeon: Carol Ada, MD;  Location: [facility] ENDOSCOPY;  Service: Endoscopy;;  . BIOPSY  08/22/2018   Procedure: BIOPSY;  Surgeon: Juanita Craver, MD;  Location: [facility] ENDOSCOPY;  Service: Endoscopy;;  . BREAST BIOPSY Right   . BREAST EXCISIONAL BIOPSY Left   . BREAST SURGERY  1988   biopsy  . CESAREAN SECTION    . COLONOSCOPY N/A 08/22/2018   Procedure: COLONOSCOPY;  Surgeon: Juanita Craver, MD;  Location: [facility] ENDOSCOPY;  Service: Endoscopy;  Laterality: N/A;  . ESOPHAGOGASTRODUODENOSCOPY (EGD) WITH PROPOFOL N/A 08/04/2018   Procedure: ESOPHAGOGASTRODUODENOSCOPY (EGD) WITH PROPOFOL;  Surgeon: Ladene Artist, MD;  Location: [place];  Service: Gastroenterology;  Laterality: N/A;  . FLEXIBLE SIGMOIDOSCOPY N/A 08/04/2018   Procedure: FLEXIBLE SIGMOIDOSCOPY;  Surgeon: Ladene Artist, MD;  Location: [place];  Service: Gastroenterology;  Laterality: N/A;  . FLEXIBLE SIGMOIDOSCOPY N/A 08/09/2018   Procedure: FLEXIBLE SIGMOIDOSCOPY;  Surgeon: Carol Ada, MD;  Location: [place];  Service: Endoscopy;  Laterality: N/A;  . VIDEO BRONCHOSCOPY WITH ENDOBRONCHIAL NAVIGATION N/A 03/14/2018   Procedure: VIDEO BRONCHOSCOPY WITH ENDOBRONCHIAL NAVIGATION;  Surgeon: Collene Gobble, MD;  Location: [place];  Service: Thoracic;  Laterality:  N/A;     A IV Location/Drains/Wounds Patient Lines/Drains/Airways Status   Active Line/Drains/Airways    Name:   Placement date:   Placement time:   Site:   Days:   Peripheral IV 01/30/19 Right;Anterior Forearm   01/30/19    1752    Forearm   1   Wound / Incision (Open or Dehisced) 01/09/19 Toe (Comment  which one) Anterior;Left abrasion skin flap 2 x 1 cm   01/09/19    1839    Toe (Comment  which one)   22          Intake/Output Last 24 hours No intake or output data in the 24 hours  ending 01/31/19 0223  Labs/Imaging Results for orders placed or performed during the hospital encounter of 01/30/19 (from the past 48 hour(s))  CBC with Differential/Platelet     Status: Abnormal   Collection Time: 01/30/19  5:20 PM  Result Value Ref Range   WBC 10.1 4.0 - 10.5 K/uL   RBC 4.40 3.87 - 5.11 MIL/uL   Hemoglobin 13.4 12.0 - 15.0 g/dL   HCT 42.7 36.0 - 46.0 %   MCV 97.0 80.0 - 100.0 fL   MCH 30.5 26.0 - 34.0 pg   MCHC 31.4 30.0 - 36.0 g/dL   RDW 14.6 11.5 - 15.5 %   Platelets 296 150 - 400 K/uL   nRBC 0.0 0.0 - 0.2 %   Neutrophils Relative % 80 %   Neutro Abs 8.0 (H) 1.7 - 7.7 K/uL   Lymphocytes Relative 14 %   Lymphs Abs 1.4 0.7 - 4.0 K/uL   Monocytes Relative 6 %   Monocytes Absolute 0.6 0.1 - 1.0 K/uL   Eosinophils Relative 0 %   Eosinophils Absolute 0.0 0.0 - 0.5 K/uL   Basophils Relative 0 %   Basophils Absolute 0.0 0.0 - 0.1 K/uL   Immature Granulocytes 0 %   Abs Immature Granulocytes 0.04 0.00 - 0.07 K/uL    Comment: Performed at [facility], [address] [place], [place] [address]  Comprehensive metabolic panel     Status: Abnormal   Collection Time: 01/30/19  7:06 PM  Result Value Ref Range   Sodium 135 135 - 145 mmol/L   Potassium 4.7 3.5 - 5.1 mmol/L   Chloride 112 (H) 98 - 111 mmol/L   CO2 13 (L) 22 - 32 mmol/L   Glucose, Bld 69 (L) 70 - 99 mg/dL   BUN 48 (H) 8 - 23 mg/dL   Creatinine, Ser 4.07 (H) 0.44 - 1.00 mg/dL   Calcium 7.7 (L) 8.9 - 10.3 mg/dL   Total Protein 5.3 (L) 6.5 - 8.1 g/dL   Albumin 2.5 (L) 3.5 - 5.0 g/dL   AST 13 (L) 15 - 41 U/L   ALT 12 0 - 44 U/L   Alkaline Phosphatase 59 38 - 126 U/L   Total Bilirubin 0.5 0.3 - 1.2 mg/dL   GFR calc non Af Amer 10 (L) >60 mL/min   GFR calc Af Amer 12 (L) >60 mL/min   Anion gap 10 5 - 15    Comment: Performed at [facility], [place] [address], [place], [place] [address]  Lipase, blood     Status: None   Collection Time: 01/30/19  7:06 PM  Result Value Ref Range   Lipase 17  11 - 51 U/L    Comment: Performed at [facility] [address], [place], [place] [address]  SARS CORONAVIRUS 2 (TAT  6-24 HRS) Nasopharyngeal Nasopharyngeal Swab     Status: None   Collection Time: 01/30/19  9:28 PM   Specimen: Nasopharyngeal Swab  Result Value Ref Range   SARS Coronavirus 2 NEGATIVE NEGATIVE    Comment: (NOTE) SARS-CoV-2 target nucleic acids are NOT DETECTED. The SARS-CoV-2 RNA is generally detectable in upper and lower respiratory specimens during the acute phase of infection. Negative results do not preclude SARS-CoV-2 infection, do not rule out co-infections with other pathogens, and should not be used as the sole basis for treatment or other patient management decisions. Negative results must be combined with clinical observations, patient history, and epidemiological information. The expected result is Negative. Fact Sheet for Patients: SugarRoll.be Fact Sheet for Healthcare Providers: https://www.woods-mathews.com/ This test is not yet approved or cleared by the [place] FDA and  has been authorized for detection and/or diagnosis of SARS-CoV-2 by FDA under an Emergency Use Authorization (EUA). This EUA will remain  in effect (meaning this test can be used) for the duration of the COVID-19 declaration under Section 56 4(b)(1) of the Act, 21 U.S.C. section 360bbb-3(b)(1), unless the authorization is terminated or revoked sooner. Performed at [facility], [place] [address], [place], [place] [address]   Urinalysis, Routine w reflex microscopic     Status: Abnormal   Collection Time: 01/30/19 10:04 PM  Result Value Ref Range   Color, Urine YELLOW YELLOW   APPearance HAZY (A) CLEAR   Specific Gravity, Urine 1.006 1.005 - 1.030   pH 5.0 5.0 - 8.0   Glucose, UA NEGATIVE NEGATIVE mg/dL   Hgb urine dipstick NEGATIVE NEGATIVE   Bilirubin Urine NEGATIVE NEGATIVE   Ketones, ur 5 (A) NEGATIVE mg/dL   Protein,  ur NEGATIVE NEGATIVE mg/dL   Nitrite NEGATIVE NEGATIVE   Leukocytes,Ua NEGATIVE NEGATIVE    Comment: Performed at [place] [address] [place], [place] [address]  Heparin level (unfractionated)     Status: Abnormal   Collection Time: 01/31/19  1:02 AM  Result Value Ref Range   Heparin Unfractionated 0.18 (L) 0.30 - 0.70 IU/mL    Comment: (NOTE) If heparin results are below expected values, and patient dosage has  been confirmed, suggest follow up testing of antithrombin III levels. Performed at [facility], [place] [address] [place], [place] [address]   APTT     Status: None   Collection Time: 01/31/19  1:02 AM  Result Value Ref Range   aPTT 35 24 - 36 seconds    Comment: Performed at [facility] [address], [place], [place] [address]  Magnesium     Status: Abnormal   Collection Time: 01/31/19  1:02 AM  Result Value Ref Range   Magnesium 0.9 (LL) 1.7 - 2.4 mg/dL    Comment: CRITICAL RESULT CALLED TO, READ BACK BY AND VERIFIED WITH: J.Verlon Setting 2505 01/31/2019 M.CAMPBELL Performed at [facility], [place] [address] [place], [place] [address]   Phosphorus     Status: None   Collection Time: 01/31/19  1:02 AM  Result Value Ref Range   Phosphorus 3.9 2.5 - 4.6 mg/dL    Comment: Performed at [facility] [address] [place], [place] [address]   US Renal  Result Date: 01/31/2019 CLINICAL DATA:  Acute kidney injury. EXAM: RENAL / URINARY TRACT ULTRASOUND COMPLETE COMPARISON:  CT 08/21/2018 FINDINGS: Right Kidney: Renal measurements: 9.0 x 4.4 x 3.7 cm = volume: 77 mL. Echogenicity within normal limits. Cyst in  the lateral mid kidney measures 2.2 x 1.8 x 1.9 cm. This is unchanged in size from prior exam. No solid mass or hydronephrosis visualized. Left Kidney: Renal measurements: 9.6 x 4.1 x 3.0 cm = volume: 63 mL. Echogenicity within normal limits. No mass or hydronephrosis visualized. Bladder: Appears normal for degree of bladder  distention. Other: None. IMPRESSION: 1. No hydronephrosis or obstructive uropathy. 2. Right renal cyst. Electronically Signed   By: Keith Rake M.D.   On: 01/31/2019 00:18   DG Abd 2 Views  Result Date: 01/31/2019 CLINICAL DATA:  Diarrhea and nausea. EXAM: ABDOMEN - 2 VIEW COMPARISON:  CT 08/21/2018 FINDINGS: No bowel dilatation to suggest obstruction. No free intra-abdominal air. No radiopaque calculi or abnormal soft tissue calcifications. Lung bases are clear. Scoliotic curvature of the spine with multilevel degenerative change. IMPRESSION: Nonobstructive bowel gas pattern.  No free air. Electronically Signed   By: Keith Rake M.D.   On: 01/31/2019 00:04    Pending Labs Unresulted Labs (From admission, onward)    Start     Ordered   02/01/19 0500  APTT  Daily,   R     01/30/19 2320   01/31/19 0800  APTT  Once-Timed,   STAT     01/30/19 2320   01/31/19 0500  Comprehensive metabolic panel  Tomorrow morning,   R     01/30/19 2330   01/31/19 0500  CBC  Daily,   R     01/30/19 2320   01/31/19 0500  Heparin level (unfractionated)  Daily,   R     01/30/19 2320   01/30/19 2330  Protein / creatinine ratio, urine  Once,   STAT     01/30/19 2330   01/30/19 2330  Sodium, urine, random  Once,   STAT     01/30/19 2330   01/30/19 2330  C3 complement  Once,   STAT     01/30/19 2330   01/30/19 2330  C4 complement  Once,   STAT     01/30/19 2330   01/30/19 2330  Antinuclear Antibodies, IFA  Once,   STAT     01/30/19 2330   01/30/19 2330  Lactoferrin, Fecal,Qualitative  Once,   STAT     01/30/19 2330   01/30/19 2330  OVA + PARASITE EXAM  Once,   STAT     01/30/19 2330   01/30/19 2330  GI pathogen panel by PCR, stool  (Gastrointestinal Panel by PCR, Stool                                                                                                                                                     *Does Not include CLOSTRIDIUM DIFFICILE testing.**If CDIFF testing is needed, select the  C Difficile Quick Screen w PCR reflex order below)  Once,   STAT  01/30/19 2330   01/30/19 1632  CBC with Differential  (ED Abdominal Pain)  ONCE - STAT,   STAT     01/30/19 1632          Vitals/Pain Today's Vitals   01/30/19 2045 01/30/19 2100 01/30/19 2115 01/30/19 2130  BP: (!) 113/57 (!) 107/56 108/63 (!) 110/57  Pulse: 78 77 79 81  Resp: 17 16 13 18   Temp:      TempSrc:      SpO2: 99% 100% 99% 100%  Weight:      Height:      PainSc:        Isolation Precautions Enteric precautions (UV disinfection)  Medications Medications  diphenoxylate-atropine (LOMOTIL) 2.5-0.025 MG per tablet 1 tablet (has no administration in time range)  sodium bicarbonate 150 mEq in dextrose 5% 1000 mL infusion (150 mEq Intravenous New Bag/Given 01/31/19 0130)  ondansetron (ZOFRAN) injection 4 mg (has no administration in time range)  heparin ADULT infusion 100 units/mL (25000 units/27mL sodium chloride 0.45%) (800 Units/hr Intravenous New Bag/Given 01/31/19 0134)  sodium chloride 0.9 % bolus 1,000 mL (0 mLs Intravenous Stopped 01/30/19 1908)  metoCLOPramide (REGLAN) injection 10 mg (10 mg Intravenous Given 01/30/19 1811)  diphenhydrAMINE (BENADRYL) injection 12.5 mg (12.5 mg Intravenous Given 01/30/19 1811)  sodium chloride 0.9 % bolus 1,000 mL (0 mLs Intravenous Stopped 01/30/19 2330)    Mobility walks Low fall risk   Focused Assessments GI   R Recommendations: See Admitting Provider Note  Report given to:   Additional Notes:

## 2019-01-31 NOTE — Progress Notes (Signed)
Initial Nutrition Assessment  DOCUMENTATION CODES:   Severe malnutrition in context of chronic illness  INTERVENTION:    Boost Breeze po TID, each supplement provides 250 kcal and 9 grams of protein  MVI with minerals  NUTRITION DIAGNOSIS:   Severe Malnutrition related to chronic illness(diarrhea) as evidenced by severe muscle depletion, moderate fat depletion, percent weight loss, energy intake < or equal to 75% for > or equal to 1 month.  GOAL:   Patient will meet greater than or equal to 90% of their needs  MONITOR:   PO intake, Supplement acceptance, Diet advancement, Weight trends, Labs, I & O's, Skin  REASON FOR ASSESSMENT:   Malnutrition Screening Tool    ASSESSMENT:   Patient with PMH significant for chronic diarrhea, unprovoked PE, HTN, HLD, DM, CAD, and colonic polyp. Presents this admission with AKI and ongoing chronic diarrhea.   Pt reports having a decreased appetite for the last 2-3 months due to worsening nausea upon eating. States her appetite has not been poor since dealing with chronic diarrhea but declined even more with nausea. During this time she consumed bites of apples, cheese, baked chicken, oatmeal, and had one Ensure daily. Discussed the importance of protein intake for preservation of lean body mass. Pt amendable to supplementation.   Pt endorses a UBW of 142 lb (last time at that weight in July) and a 40 lb unintentional wt loss. Records indicate pt weighed 145 on 6/26 and 109 lb this admission (24.8wt loss in 6 months, significant for time frame).   I/O: +331 ml since admit UOP:450 ml x 24 hrs   Drips: sodium bicarb 150 mEq in D5 @ 75 ml/hr  Medications: lomotil Labs: CBG 78-105, Mg 0.9 (L)     NUTRITION - FOCUSED PHYSICAL EXAM:    Most Recent Value  Orbital Region  Mild depletion  Upper Arm Region  Severe depletion  Thoracic and Lumbar Region  Unable to assess  Buccal Region  Moderate depletion  Temple Region  Moderate depletion   Clavicle Bone Region  Moderate depletion  Clavicle and Acromion Bone Region  Moderate depletion  Scapular Bone Region  Unable to assess  Dorsal Hand  Severe depletion  Patellar Region  Severe depletion  Anterior Thigh Region  Severe depletion  Posterior Calf Region  Severe depletion  Edema (RD Assessment)  None  Hair  Reviewed  Eyes  Reviewed  Mouth  Reviewed  Skin  Reviewed  Nails  Reviewed       Diet Order:   Diet Order            Diet clear liquid Room service appropriate? Yes; Fluid consistency: Thin  Diet effective now              EDUCATION NEEDS:   Education needs have been addressed  Skin:  Skin Assessment: Skin Integrity Issues: Skin Integrity Issues:: Other (Comment) Other: L toe wound  Last BM:  12/17  Height:   Ht Readings from Last 1 Encounters:  01/31/19 5' 1.5" (1.562 m)    Weight:   Wt Readings from Last 1 Encounters:  01/31/19 49.6 kg    Ideal Body Weight:  50 kg  BMI:  Body mass index is 20.33 kg/m.  Estimated Nutritional Needs:   Kcal:  1600-1800  Protein:  80-100 grams  Fluid:  >/= 1.6 L/day   Mariana Single RD, LDN Clinical Nutrition Pager # - [phone]

## 2019-01-31 NOTE — Progress Notes (Signed)
[place] for heparin Indication: history of PE on Xarelto PTA  Heparin Dosing Weight: 51 kg  Labs: Recent Labs    01/30/19 1720 01/30/19 1906 01/31/19 0102 01/31/19 0333 01/31/19 0927 01/31/19 1630  HGB 13.4  --   --  11.0*  --   --   HCT 42.7  --   --  33.8*  --   --   PLT 296  --   --  279  --   --   APTT  --   --  35  --  83* 104*  HEPARINUNFRC  --   --  0.18* 0.28*  --   --   CREATININE  --  4.07*  --  3.61 2.95*  --     Assessment: 73 yo F with history of PE on Xarelto presenting with weakness, N/V/D. Pharmacy consulted to transition to heparin drip for now with patient found to have severe AKI with SCr 4.07 on admit. CBC wnl. No active bleeding issues documented.  Last dose of Xarelto documented as 12/15 PTA at 2000 per med rec.  Monitoring heparin using aPTT given recent Xarelto use.    Repeat aPTT level is elevated slightly above goal at 104 on current rate of 800 units/hr. Will reduce slightly and follow-up in AM.   Goal of Therapy:  Heparin level 0.3-0.7 units/ml aPTT 66-102 seconds Monitor platelets by anticoagulation protocol: Yes   Plan:  Decrease Heparin to 750 units/hr.  Monitor daily heparin level/aPTT/CBC, s/sx bleeding  Sloan Leiter, PharmD, BCPS, BCCCP Clinical Pharmacist Please refer to [facility] for [place] numbers 01/31/2019 5:29 PM

## 2019-02-01 DIAGNOSIS — R112 Nausea with vomiting, unspecified: Secondary | ICD-10-CM

## 2019-02-01 DIAGNOSIS — E860 Dehydration: Principal | ICD-10-CM

## 2019-02-01 LAB — CBC
HCT: 28.3 % — ABNORMAL LOW (ref 36.0–46.0)
Hemoglobin: 9.4 g/dL — ABNORMAL LOW (ref 12.0–15.0)
MCH: 30.3 pg (ref 26.0–34.0)
MCHC: 33.2 g/dL (ref 30.0–36.0)
MCV: 91.3 fL (ref 80.0–100.0)
Platelets: 277 10*3/uL (ref 150–400)
RBC: 3.1 MIL/uL — ABNORMAL LOW (ref 3.87–5.11)
RDW: 14.6 % (ref 11.5–15.5)
WBC: 7.2 10*3/uL (ref 4.0–10.5)
nRBC: 0.3 % — ABNORMAL HIGH (ref 0.0–0.2)

## 2019-02-01 LAB — BASIC METABOLIC PANEL
Anion gap: 9 (ref 5–15)
BUN: 18 mg/dL (ref 8–23)
CO2: 28 mmol/L (ref 22–32)
Calcium: 8 mg/dL — ABNORMAL LOW (ref 8.9–10.3)
Chloride: 107 mmol/L (ref 98–111)
Creatinine, Ser: 1.52 mg/dL — ABNORMAL HIGH (ref 0.44–1.00)
GFR calc Af Amer: 38 mL/min — ABNORMAL LOW (ref >60)
GFR calc non Af Amer: 33 mL/min — ABNORMAL LOW (ref >60)
Glucose, Bld: 106 mg/dL — ABNORMAL HIGH (ref 70–99)
Potassium: 2.9 mmol/L — ABNORMAL LOW (ref 3.5–5.1)
Sodium: 144 mmol/L (ref 135–145)

## 2019-02-01 LAB — HEPARIN LEVEL (UNFRACTIONATED): Heparin Unfractionated: 0.41 IU/mL (ref 0.30–0.70)

## 2019-02-01 LAB — APTT: aPTT: 153 seconds — ABNORMAL HIGH (ref 24–36)

## 2019-02-01 LAB — C4 COMPLEMENT: Complement C4, Body Fluid: 20 mg/dL (ref 12–38)

## 2019-02-01 LAB — C3 COMPLEMENT: C3 Complement: 80 mg/dL — ABNORMAL LOW (ref 82–167)

## 2019-02-01 MED ORDER — METOCLOPRAMIDE HCL 5 MG/ML IJ SOLN
5.0000 mg | Freq: Three times a day (TID) | INTRAMUSCULAR | Status: AC
  Administered 2019-02-01 – 2019-02-03 (×9): 5 mg via INTRAVENOUS
  Filled 2019-02-01 (×9): qty 2

## 2019-02-01 MED ORDER — APIXABAN 5 MG PO TABS: 5.0000 mg | ORAL_TABLET | Freq: Two times a day (BID) | ORAL | 0 refills | Status: DC

## 2019-02-01 MED ORDER — PANCRELIPASE (LIP-PROT-AMYL) 36000-114000 UNITS PO CPEP
36000.0000 [IU] | ORAL_CAPSULE | Freq: Three times a day (TID) | ORAL | Status: DC
  Administered 2019-02-02 – 2019-02-05 (×6): 36000 [IU] via ORAL
  Filled 2019-02-01 (×10): qty 1

## 2019-02-01 MED ORDER — APIXABAN 5 MG PO TABS
5.0000 mg | ORAL_TABLET | Freq: Two times a day (BID) | ORAL | Status: DC
  Administered 2019-02-01 – 2019-02-03 (×5): 5 mg via ORAL
  Filled 2019-02-01 (×5): qty 1

## 2019-02-01 MED ORDER — METOCLOPRAMIDE HCL 5 MG/ML IJ SOLN: 5.0000 mg | Freq: Three times a day (TID) | INTRAMUSCULAR | Status: DC

## 2019-02-01 MED ORDER — POTASSIUM CHLORIDE CRYS ER 20 MEQ PO TBCR
40.0000 meq | EXTENDED_RELEASE_TABLET | ORAL | Status: AC
  Administered 2019-02-01: 40 meq via ORAL
  Filled 2019-02-01 (×2): qty 2

## 2019-02-01 MED ORDER — POTASSIUM CHLORIDE IN NACL 20-0.9 MEQ/L-% IV SOLN
INTRAVENOUS | Status: DC
  Filled 2019-02-01 (×2): qty 1000

## 2019-02-01 MED FILL — ELIQUIS 5 MG TABLET: 5 | 30 days supply | Qty: 60 | Fill #0

## 2019-02-01 NOTE — Progress Notes (Signed)
Patient is refusing to drink contrast for CT imaging. MD notified and GI.   Farley Ly RN

## 2019-02-01 NOTE — Progress Notes (Signed)
[place] for heparin >> apixaban Indication: history of PE on Xarelto PTA  Heparin Dosing Weight: 51 kg  Labs: Recent Labs    01/30/19 1720 01/30/19 1720 01/31/19 0102 01/31/19 0333 01/31/19 0927 01/31/19 1630 02/01/19 0530  HGB 13.4  --   --  11.0*  --   --  9.4*  HCT 42.7  --   --  33.8*  --   --  28.3*  PLT 296  --   --  279  --   --  277  APTT  --    < > 35  --  83* 104* 153*  HEPARINUNFRC  --   --  0.18* 0.28*  --   --  0.41  CREATININE  --    < >  --  3.61 2.95*  --  1.52*   < > = values in this interval not displayed.    Assessment: 73 yo F with history of PE on Xarelto PTA presenting with weakness, N/V/D. Pharmacy consulted to transition to heparin drip for now with patient found to have severe AKI with SCr 4.07 on admit. CBC wnl. No active bleeding issues documented.  Last dose of Xarelto documented as 12/15 PTA at 2000 per med rec.  Scr with noted improvement to 1.52.  Spoke with MD re: apixaban possibly being a better choice for this patient given her renal function and poor PO intake (Xarelto required to be taken with food).  MD and patient agreeable to switch to apixaban.  Patient education on new medication completed.   Goal of Therapy:  Therapeutic anticoagulation Monitor platelets by anticoagulation protocol: Yes   Plan:  D/C heparin and associated labs. Start apixaban 5gm PO BID  [employer], Pharm.D., BCPS Clinical Pharmacist Clinical phone for 02/01/2019 from 8:30-4:00 is [phone].  **Pharmacist phone directory can be found on [email or web address] listed under [place].  02/01/2019 9:59 AM

## 2019-02-01 NOTE — Plan of Care (Signed)
  Problem: Activity: Goal: Risk for activity intolerance will decrease Outcome: Progressing   

## 2019-02-01 NOTE — Progress Notes (Signed)
Daily Rounding Note  02/01/2019, 8:29 AM  LOS: 2 days   SUBJECTIVE:   Chief complaint:   Diarrhea, N/V  No acute events overnight.  Continues to have nausea, but no emesis.  Still with 5-7 watery stools.  No hematochezia.  Despite nausea, did tolerate some liquids this morning and is requesting to advance diet as she thinks solids would be more palatable.  Renal function much improved with creatinine 1.5 (from 2.95 yesterday).  Hemoglobin decreased to 9.4 (from 11), but in the setting of IVFs.  No overt GI blood loss.  OBJECTIVE:         Vital signs in last 24 hours:    Temp:  [97.8 F (36.6 C)-98.2 F (36.8 C)] 98.2 F (36.8 C) (12/18 0550) Pulse Rate:  [72-76] 76 (12/18 0550) Resp:  [18] 18 (12/18 0550) BP: (96-114)/(57-66) 109/66 (12/18 0550) SpO2:  [94 %-98 %] 98 % (12/18 0550) Last BM Date: 01/31/19 Filed Weights   01/30/19 1635 01/31/19 0300  Weight: 51 kg 49.6 kg   General: Alert, oriented x3, NAD Heart: RRR Chest: CTA bilaterally, no wheezes Abdomen: Soft, NT, ND Extremities: No c/c/e  Intake/Output from previous day: 12/17 0701 - 12/18 0700 In: 1931.5 [P.O.:100; I.V.:1831.5] Out: 650 [Urine:650]  Intake/Output this shift: No intake/output data recorded.  Lab Results: Recent Labs    01/30/19 1720 01/31/19 0333 02/01/19 0530  WBC 10.1 6.5 7.2  HGB 13.4 11.0* 9.4*  HCT 42.7 33.8* 28.3*  PLT 296 279 277   BMET Recent Labs    01/31/19 0333 01/31/19 0927 02/01/19 0530  NA 137 140 144  K 4.4 3.7 2.9*  CL 112 112* 107  CO2 11 17* 28  GLUCOSE 78 105* 106*  BUN 45 38* 18  CREATININE 3.61 2.95* 1.52*  CALCIUM 8.0 8.2* 8.0*   LFT Recent Labs    01/30/19 1906 01/31/19 0333 01/31/19 0927  PROT 5.3* 5.6 5.5*  ALBUMIN 2.5* 2.7 2.6*  AST 13* 19 15  ALT 12 12 13   ALKPHOS 59 71 67  BILITOT 0.5 QUANTITY NOT SUFFICIENT, UNABLE TO PERFORM TEST 0.6   PT/INR No results for input(s): LABPROT,  INR in the last 72 hours. Hepatitis Panel No results for input(s): HEPBSAG, HCVAB, HEPAIGM, HEPBIGM in the last 72 hours.  Studies/Results: US Renal  Result Date: 01/31/2019 CLINICAL DATA:  Acute kidney injury. EXAM: RENAL / URINARY TRACT ULTRASOUND COMPLETE COMPARISON:  CT 08/21/2018 FINDINGS: Right Kidney: Renal measurements: 9.0 x 4.4 x 3.7 cm = volume: 77 mL. Echogenicity within normal limits. Cyst in the lateral mid kidney measures 2.2 x 1.8 x 1.9 cm. This is unchanged in size from prior exam. No solid mass or hydronephrosis visualized. Left Kidney: Renal measurements: 9.6 x 4.1 x 3.0 cm = volume: 63 mL. Echogenicity within normal limits. No mass or hydronephrosis visualized. Bladder: Appears normal for degree of bladder distention. Other: None. IMPRESSION: 1. No hydronephrosis or obstructive uropathy. 2. Right renal cyst. Electronically Signed   By: Keith Rake M.D.   On: 01/31/2019 00:18   DG Abd 2 Views  Result Date: 01/31/2019 CLINICAL DATA:  Diarrhea and nausea. EXAM: ABDOMEN - 2 VIEW COMPARISON:  CT 08/21/2018 FINDINGS: No bowel dilatation to suggest obstruction. No free intra-abdominal air. No radiopaque calculi or abnormal soft tissue calcifications. Lung bases are clear. Scoliotic curvature of the spine with multilevel degenerative change. IMPRESSION: Nonobstructive bowel gas pattern.  No free air. Electronically Signed   By: Threasa Beards  Sanford M.D.   On: 01/31/2019 00:04   Scheduled Meds: . diphenoxylate-atropine  1 tablet Oral TID  . feeding supplement  1 Container Oral TID BM  . loperamide  2 mg Oral QID  . multivitamin with minerals  1 tablet Oral Daily  . potassium chloride  40 mEq Oral Q4H   Continuous Infusions: . 0.9 % NaCl with KCl 20 mEq / L     PRN Meds:.ondansetron (ZOFRAN) IV   ASSESMENT and PLAN:   1) Chronic diarrhea, nausea/vomiting started 07/2018.  Multiple negative infectious wups (C diff, stool path panels), unrevealing imaging studies (non-specific  enteritis on 08/2018 CT), negative flex sigs 07/2018, colonoscopy w bxs 08/2018, gastric and duodenal polyps at 07/2018 EGD (not bxd).  Fecal lactoferrin,  Chromoganin A, fecal calprotectin are all elevated/positive.  R/o neuroendocrine tumor. -Again had a long conversation regarding her extensive work-up to date along with future diagnostic options.  Discussed full DDX, to include neuroendocrine tumors, small bowel Crohn's, etc. -PET gallium 37 dotatate study is study of choice to r/o NET.  However, this cannot be done as an inpatient -Given the enteritis seen on prior CT, ongoing symptoms, elevated inflammatory markers, will plan on CT enterography to r/o small bowel pathology.  Did not want to proceed with VCE, as patients with potential small bowel NETs could have obstruction and pill retention -Resume Lomotil and Imodium as prescribed -Stool studies pending to again r/o infectious etiology -Will restart Pancrease given mild to moderately reduced pancreatic fecal elastase (107).  We will start with "snack dose" at 36,000 units with meals -Will ultimately need to follow-up with her primary GI for ongoing work-up as outlined.  Has an appointment already scheduled for later this month with Dr. Earlean Shawl -Pending work-up, could require repeat upper endoscopy/colonoscopy with extensive biopsies to r/o subtle IBD  2) Nausea/Vomiting: -Tolerating liquids, but still with nausea -Resume current antiemetics -Adding short trial of Reglan 5 mg IV bid.  Did discuss potential side effect of worsening diarrhea, but hoping addition of promotility agent could aid in p.o. tolerance.  Discussed that this is for short course use only -Advancing diet per patient request -Recommend low-fat/low fiber foods for now, with plan to AAT  3) AKI: -Improving with IV fluids.  Creatinine 1.5 today  4) Hypokalemia: -K2.9.  Supplement ordered -Continue daily trend with electrolyte repletion prn  5) Normocytic anemia: -Suspect  d/t appropriate aggressive IV fluids.  No overt GI blood loss -Daily trend okay     6) Chronic Xarelto for PE 2019.  No AC in place currently.     7) History of Psoriasis    Gerrit Heck, DO, [facility]

## 2019-02-01 NOTE — Progress Notes (Signed)
PROGRESS NOTE                                                                                                                                                                                                             Patient Demographics:    [name], is a 73 y.o. female, DOB - 04-May-1945, HMC:[id number]  Admit date - 01/30/2019   Admitting Physician Bonnell Public, MD  Outpatient Primary MD for the patient is [name], MD  LOS - 2   Chief Complaint  Patient presents with  . Weakness       Brief Narrative    Patient is a 73 year old lady with past medical history significant for chronic diarrhea, unprovoked pulmonary embolism that was diagnosed in 2019 on Xarelto, hypertension, hyperlipidemia, documented diabetes mellitus and coronary artery disease, colonic polyp and anemia.  Patient started having diarrhea in June of this year.  Essentially, patient has continued to have diarrhea since June, has been admitted to this hospital 3 times, last admission being in November 2020.  Patient was discharged from [facility] on 01/21/2019 after 3-day stay for worsening diarrhea.  On discharge, diarrhea continued to get worse, as well as nausea and vomiting.  Work-up significant for acute kidney injury secondary to diarrhea, GI were consulted for further evaluation and recommendation for her chronic diarrhea.   Subjective:    [name] today reports some nausea, but no vomiting, still having diarrhea, reports 4 episodes since midnight .   Assessment  & Plan :    Active Problems:   Exocrine pancreatic insufficiency   AKI (acute kidney injury) ([place])   Protein-calorie malnutrition, severe   Hypomagnesemia   Acute kidney injury -Baseline creatinine less than 1, significantly elevated at 4.07 on discharge. -This is most likely in the setting of this is in the setting of prerenal acidemia, and possibly ATN given episode of  hypotension on presentation and clinical dehydration. -Improving with IV fluids, it is 1.5 today, I will discontinue bicarb drip given her bicarb level is 28, will switch to NS with KCl given her hypokalemia.   Chronic diarrhea: -Appears to be chronic problem, requiring multiple admissions . -So far no clear etiology, GI input greatly appreciated . -She is back on Lomotil and Imodium with little help, will start back on Creon per GI recommendation. -Patient chromogranin  A was elevated at [facility], suspicion for neuroendocrine tumor given lab findings, she will need PET scan gallium-68 dotatate study.  This can only be performed as an outpatient.   Have discussed with Dr. Curt Bears, her outpatient oncologist, he will arrange for outpatient follow-up to help arrange for this test, will she will start following with GI Dr. Murvin Natal upon discharge, already having scheduled appointment 02/13/2019.  Nausea and vomiting: -Supportive care for now. -Continue IV Zofran -Continue hydration  History of unprovoked pulmonary embolism in 2019: -According to the patient, hematology team has advised continuing Xarelto. -Due to severe AKI, he was initially on heparin gtt., now renal function has improved she can go back on DOAC, given her recurrent admission, with nausea and unreliable oral intake, Xarelto is less ideal option as it has to be taken with food, she will be transitioned to Eliquis.  Hypertension: -Patient was hypotensive on presentation, with documented blood pressure 74/56 mmHg -Continue to hold antihypertensive regiment, continue with IV fluids    COVID-19 Labs  No results for input(s): DDIMER, FERRITIN, LDH, CRP in the last 72 hours.  Lab Results  Component Value Date   SARSCOV2NAA NEGATIVE 01/30/2019   [place] NEGATIVE 01/04/2019   [place] NEGATIVE 08/21/2018   SARSCOV2NAA NOT DETECTED 08/01/2018     Code Status : Full  Family Communication  : D/W  patient  Disposition Plan  : Home  Barriers For Discharge : Remains on IV fluid, renal function far from baseline  Consults  :  GI  Procedures  : None  DVT Prophylaxis  :  Heparin GTT>> Eliquis  Lab Results  Component Value Date   PLT 277 02/01/2019    Antibiotics  :    Anti-infectives (From admission, onward)   None        Objective:   Vitals:   01/31/19 1659 01/31/19 2051 02/01/19 0550 02/01/19 1001  BP: (!) 108/57 96/60 109/66 (!) 94/53  Pulse: 76 75 76 67  Resp: 18 18 18 18   Temp: 98.1 F (36.7 C) 97.8 F (36.6 C) 98.2 F (36.8 C) 99.2 F (37.3 C)  TempSrc: Oral  Oral Oral  SpO2: 97% 98% 98% 98%  Weight:      Height:        Wt Readings from Last 3 Encounters:  01/31/19 49.6 kg  01/23/19 51.1 kg  01/17/19 53 kg     Intake/Output Summary (Last 24 hours) at 02/01/2019 1027 Last data filed at 02/01/2019 0936 Gross per 24 hour  Intake 2171.54 ml  Output 1050 ml  Net 1121.54 ml     Physical Exam  Awake Alert, Oriented X 3, No new F.N deficits, Normal affect Symmetrical Chest wall movement, Good air movement bilaterally, CTAB RRR,No Gallops,Rubs or new Murmurs, No Parasternal Heave +ve B.Sounds, Abd Soft, No tenderness, No rebound - guarding or rigidity. No Cyanosis, Clubbing or edema, No new Rash or bruise      Data Review:    CBC Recent Labs  Lab 01/30/19 1720 01/31/19 0333 02/01/19 0530  WBC 10.1 6.5 7.2  HGB 13.4 11.0* 9.4*  HCT 42.7 33.8* 28.3*  PLT 296 279 277  MCV 97.0 94.4 91.3  MCH 30.5 30.7 30.3  MCHC 31.4 32.5 33.2  RDW 14.6 14.6 14.6  LYMPHSABS 1.4  --   --   MONOABS 0.6  --   --   EOSABS 0.0  --   --   BASOSABS 0.0  --   --     Chemistries  Recent  Labs  Lab 01/30/19 1906 01/31/19 0102 01/31/19 0333 01/31/19 0927 02/01/19 0530  NA 135  --  137 140 144  K 4.7  --  4.4 3.7 2.9*  CL 112*  --  112 112* 107  CO2 13*  --  11 17* 28  GLUCOSE 69*  --  78 105* 106*  BUN 48*  --  45 38* 18  CREATININE 4.07*  --   3.61 2.95* 1.52*  CALCIUM 7.7*  --  8.0 8.2* 8.0*  MG  --  0.9*  --  1.7  --   AST 13*  --  19 15  --   ALT 12  --  12 13  --   ALKPHOS 59  --  71 67  --   BILITOT 0.5  --  QUANTITY NOT SUFFICIENT, UNABLE TO PERFORM TEST 0.6  --    ------------------------------------------------------------------------------------------------------------------ No results for input(s): CHOL, HDL, LDLCALC, TRIG, CHOLHDL, LDLDIRECT in the last 72 hours.  Lab Results  Component Value Date   HGBA1C 5.8 12/19/2018   ------------------------------------------------------------------------------------------------------------------ No results for input(s): TSH, T4TOTAL, T3FREE, THYROIDAB in the last 72 hours.  Invalid input(s): FREET3 ------------------------------------------------------------------------------------------------------------------ No results for input(s): VITAMINB12, FOLATE, FERRITIN, TIBC, IRON, RETICCTPCT in the last 72 hours.  Coagulation profile No results for input(s): INR, PROTIME in the last 168 hours.  No results for input(s): DDIMER in the last 72 hours.  Cardiac Enzymes No results for input(s): CKMB, TROPONINI, MYOGLOBIN in the last 168 hours.  Invalid input(s): CK ------------------------------------------------------------------------------------------------------------------    Component Value Date/Time   BNP 40.2 12/23/2017 2339    Inpatient Medications  Scheduled Meds: . apixaban  5 mg Oral BID  . diphenoxylate-atropine  1 tablet Oral TID  . feeding supplement  1 Container Oral TID BM  . lipase/protease/amylase  36,000 Units Oral TID WC  . loperamide  2 mg Oral QID  . metoCLOPramide (REGLAN) injection  5 mg Intravenous Q8H  . multivitamin with minerals  1 tablet Oral Daily  . potassium chloride  40 mEq Oral Q4H   Continuous Infusions: . 0.9 % NaCl with KCl 20 mEq / L 75 mL/hr at 02/01/19 1009   PRN Meds:.ondansetron (ZOFRAN) IV  Micro Results Recent  Results (from the past 240 hour(s))  SARS CORONAVIRUS 2 (TAT 6-24 HRS) Nasopharyngeal Nasopharyngeal Swab     Status: None   Collection Time: 01/30/19  9:28 PM   Specimen: Nasopharyngeal Swab  Result Value Ref Range Status   SARS Coronavirus 2 NEGATIVE NEGATIVE Final    Comment: (NOTE) SARS-CoV-2 target nucleic acids are NOT DETECTED. The SARS-CoV-2 RNA is generally detectable in upper and lower respiratory specimens during the acute phase of infection. Negative results do not preclude SARS-CoV-2 infection, do not rule out co-infections with other pathogens, and should not be used as the sole basis for treatment or other patient management decisions. Negative results must be combined with clinical observations, patient history, and epidemiological information. The expected result is Negative. Fact Sheet for Patients: SugarRoll.be Fact Sheet for Healthcare Providers: https://www.woods-mathews.com/ This test is not yet approved or cleared by the [place] FDA and  has been authorized for detection and/or diagnosis of SARS-CoV-2 by FDA under an Emergency Use Authorization (EUA). This EUA will remain  in effect (meaning this test can be used) for the duration of the COVID-19 declaration under Section 56 4(b)(1) of the Act, 21 U.S.C. section 360bbb-3(b)(1), unless the authorization is terminated or revoked sooner. Performed at [facility], [place]  [address] [place], [place] [address]     Radiology Reports US Renal  Result Date: 01/31/2019 CLINICAL DATA:  Acute kidney injury. EXAM: RENAL / URINARY TRACT ULTRASOUND COMPLETE COMPARISON:  CT 08/21/2018 FINDINGS: Right Kidney: Renal measurements: 9.0 x 4.4 x 3.7 cm = volume: 77 mL. Echogenicity within normal limits. Cyst in the lateral mid kidney measures 2.2 x 1.8 x 1.9 cm. This is unchanged in size from prior exam. No solid mass or hydronephrosis visualized. Left Kidney: Renal measurements:  9.6 x 4.1 x 3.0 cm = volume: 63 mL. Echogenicity within normal limits. No mass or hydronephrosis visualized. Bladder: Appears normal for degree of bladder distention. Other: None. IMPRESSION: 1. No hydronephrosis or obstructive uropathy. 2. Right renal cyst. Electronically Signed   By: Keith Rake M.D.   On: 01/31/2019 00:18   DG Abd 2 Views  Result Date: 01/31/2019 CLINICAL DATA:  Diarrhea and nausea. EXAM: ABDOMEN - 2 VIEW COMPARISON:  CT 08/21/2018 FINDINGS: No bowel dilatation to suggest obstruction. No free intra-abdominal air. No radiopaque calculi or abnormal soft tissue calcifications. Lung bases are clear. Scoliotic curvature of the spine with multilevel degenerative change. IMPRESSION: Nonobstructive bowel gas pattern.  No free air. Electronically Signed   By: Keith Rake M.D.   On: 01/31/2019 00:04     Phillips Climes M.D on 02/01/2019 at 10:27 AM  Between 7am to 7pm - Pager - [phone]  After 7pm go to [email or web address] - password [email or web address]  [facility] -  Office  [phone]

## 2019-02-01 NOTE — Discharge Instructions (Signed)
Information on my medicine - ELIQUIS (apixaban)  This medication education was reviewed with me or my healthcare representative as part of my discharge preparation.  The pharmacist that spoke with me during my hospital stay was:  [employer], Pharm.D.  Why was Eliquis prescribed for you? Eliquis was prescribed to treat blood clots that may have been found in the veins of your legs (deep vein thrombosis) or in your lungs (pulmonary embolism) and to reduce the risk of them occurring again.  What do You need to know about Eliquis ? The dose is 5 mg taken TWICE daily.  Eliquis may be taken with or without food.   Try to take the dose about the same time in the morning and in the evening. If you have difficulty swallowing the tablet whole please discuss with your pharmacist how to take the medication safely.  Take Eliquis exactly as prescribed and DO NOT stop taking Eliquis without talking to the doctor who prescribed the medication.  Stopping may increase your risk of developing a new blood clot.  Refill your prescription before you run out.  After discharge, you should have regular check-up appointments with your healthcare provider that is prescribing your Eliquis.    What do you do if you miss a dose? If a dose of ELIQUIS is not taken at the scheduled time, take it as soon as possible on the same day and twice-daily administration should be resumed. The dose should not be doubled to make up for a missed dose.  Important Safety Information A possible side effect of Eliquis is bleeding. You should call your healthcare provider right away if you experience any of the following: ? Bleeding from an injury or your nose that does not stop. ? Unusual colored urine (red or dark brown) or unusual colored stools (red or black). ? Unusual bruising for unknown reasons. ? A serious fall or if you hit your head (even if there is no bleeding).  Some medicines may interact with Eliquis and  might increase your risk of bleeding or clotting while on Eliquis. To help avoid this, consult your healthcare provider or pharmacist prior to using any new prescription or non-prescription medications, including herbals, vitamins, non-steroidal anti-inflammatory drugs (NSAIDs) and supplements.  This website has more information on Eliquis (apixaban): http://www.eliquis.com/eliquis/home

## 2019-02-01 NOTE — Plan of Care (Signed)
  Problem: Clinical Measurements: Goal: Ability to maintain clinical measurements within normal limits will improve Outcome: Progressing   

## 2019-02-02 ENCOUNTER — Inpatient Hospital Stay (HOSPITAL_COMMUNITY): Payer: Medicare Other

## 2019-02-02 ENCOUNTER — Encounter (HOSPITAL_COMMUNITY): Payer: Self-pay | Admitting: Internal Medicine

## 2019-02-02 LAB — COMPREHENSIVE METABOLIC PANEL
ALT: 14 U/L (ref 0–44)
AST: 15 U/L (ref 15–41)
Albumin: 2.2 g/dL — ABNORMAL LOW (ref 3.5–5.0)
Alkaline Phosphatase: 59 U/L (ref 38–126)
Anion gap: 7 (ref 5–15)
BUN: 12 mg/dL (ref 8–23)
CO2: 25 mmol/L (ref 22–32)
Calcium: 8 mg/dL — ABNORMAL LOW (ref 8.9–10.3)
Chloride: 112 mmol/L — ABNORMAL HIGH (ref 98–111)
Creatinine, Ser: 1.12 mg/dL — ABNORMAL HIGH (ref 0.44–1.00)
GFR calc Af Amer: 56 mL/min — ABNORMAL LOW (ref >60)
GFR calc non Af Amer: 48 mL/min — ABNORMAL LOW (ref >60)
Glucose, Bld: 86 mg/dL (ref 70–99)
Potassium: 4.2 mmol/L (ref 3.5–5.1)
Sodium: 144 mmol/L (ref 135–145)
Total Bilirubin: 0.6 mg/dL (ref 0.3–1.2)
Total Protein: 4.9 g/dL — ABNORMAL LOW (ref 6.5–8.1)

## 2019-02-02 LAB — FANA STAINING PATTERNS: Homogeneous Pattern: 1:1280 {titer} — ABNORMAL HIGH

## 2019-02-02 LAB — CBC
HCT: 27.3 % — ABNORMAL LOW (ref 36.0–46.0)
Hemoglobin: 9.1 g/dL — ABNORMAL LOW (ref 12.0–15.0)
MCH: 30.8 pg (ref 26.0–34.0)
MCHC: 33.3 g/dL (ref 30.0–36.0)
MCV: 92.5 fL (ref 80.0–100.0)
Platelets: 218 10*3/uL (ref 150–400)
RBC: 2.95 MIL/uL — ABNORMAL LOW (ref 3.87–5.11)
RDW: 14.6 % (ref 11.5–15.5)
WBC: 7.1 10*3/uL (ref 4.0–10.5)
nRBC: 0 % (ref 0.0–0.2)

## 2019-02-02 LAB — IGA: IgA: 362 mg/dL (ref 64–422)

## 2019-02-02 LAB — ANTINUCLEAR ANTIBODIES, IFA: ANA Ab, IFA: POSITIVE — AB

## 2019-02-02 MED ORDER — PROCHLORPERAZINE EDISYLATE 10 MG/2ML IJ SOLN: 10.0000 mg | Freq: Every day | INTRAMUSCULAR | Status: DC

## 2019-02-02 MED ORDER — SODIUM CHLORIDE 0.9 % IV SOLN: INTRAVENOUS | Status: DC

## 2019-02-02 MED ORDER — PROCHLORPERAZINE EDISYLATE 10 MG/2ML IJ SOLN
10.0000 mg | Freq: Once | INTRAMUSCULAR | Status: AC
  Administered 2019-02-02: 10 mg via INTRAVENOUS

## 2019-02-02 MED ORDER — IOHEXOL 300 MG/ML  SOLN
80.0000 mL | Freq: Once | INTRAMUSCULAR | Status: AC | PRN
  Administered 2019-02-02: 80 mL via INTRAVENOUS

## 2019-02-02 MED ORDER — LORATADINE 10 MG PO TABS
10.0000 mg | ORAL_TABLET | Freq: Every day | ORAL | Status: DC
  Administered 2019-02-02 – 2019-02-05 (×3): 10 mg via ORAL
  Filled 2019-02-02 (×4): qty 1

## 2019-02-02 MED ORDER — PANTOPRAZOLE SODIUM 40 MG PO TBEC
40.0000 mg | DELAYED_RELEASE_TABLET | Freq: Every day | ORAL | Status: DC
  Administered 2019-02-02 – 2019-02-05 (×4): 40 mg via ORAL
  Filled 2019-02-02 (×4): qty 1

## 2019-02-02 MED ORDER — DIPHENHYDRAMINE HCL 25 MG PO CAPS
25.0000 mg | ORAL_CAPSULE | Freq: Once | ORAL | Status: AC
  Administered 2019-02-02: 25 mg via ORAL
  Filled 2019-02-02: qty 1

## 2019-02-02 NOTE — Progress Notes (Signed)
Progress Note   Subjective  Chief Complaint: Diarrhea, nausea/vomiting  Today, the patient tells me that she successfully completed the CT and drinking all the contrast but she does not even want to talk about it because it makes her "nauseous".  Explains that she was able to eat about half of her lunch today which is good.  Tells me her abdominal pain is "moderate".  Describes having loose stools after drinking the contrast.  Spends long time discussing that she is tired of the cycle of coming in and out of the hospital and the fact that "no one really wants to help me".   Objective   Vital signs in last 24 hours: Temp:  [98.8 F (37.1 C)-98.9 F (37.2 C)] 98.8 F (37.1 C) (12/19 0858) Pulse Rate:  [65-86] 72 (12/19 1204) Resp:  [16-18] 16 (12/19 0858) BP: (100-142)/(62-79) 142/75 (12/19 1204) SpO2:  [97 %-100 %] 100 % (12/19 1204) Last BM Date: 02/02/19 General:    Elderly white female in NAD Heart:  Regular rate and rhythm; no murmurs Lungs: Respirations even and unlabored, lungs CTA bilaterally Abdomen:  Soft, nontender and nondistended. Normal bowel sounds. Extremities:  Without edema. Neurologic:  Alert and oriented,  grossly normal neurologically. Psych:  Cooperative. Normal mood and affect.  Intake/Output from previous day: 12/18 0701 - 12/19 0700 In: 1687.9 [P.O.:480; I.V.:1207.9] Out: 1401 [Urine:1400; Stool:1] Intake/Output this shift: Total I/O In: 463.8 [P.O.:240; I.V.:223.8] Out: 0   Lab Results: Recent Labs    01/31/19 0333 02/01/19 0530 02/02/19 0506  WBC 6.5 7.2 7.1  HGB 11.0* 9.4* 9.1*  HCT 33.8* 28.3* 27.3*  PLT 279 277 218   BMET Recent Labs    01/31/19 0927 02/01/19 0530 02/02/19 0506  NA 140 144 144  K 3.7 2.9* 4.2  CL 112* 107 112*  CO2 17* 28 25  GLUCOSE 105* 106* 86  BUN 38* 18 12  CREATININE 2.95* 1.52* 1.12*  CALCIUM 8.2* 8.0* 8.0*   LFT Recent Labs    02/02/19 0506  PROT 4.9*  ALBUMIN 2.2*  AST 15  ALT 14   ALKPHOS 59  BILITOT 0.6    Studies/Results: CT ENTERO ABD/PELVIS W CONTAST  Result Date: 02/02/2019 CLINICAL DATA:  Diarrhea for months, elevated markers of inflammation, enteritis on CT of July of 2020 EXAM: CT ABDOMEN AND PELVIS WITH CONTRAST (ENTEROGRAPHY) TECHNIQUE: Multidetector CT of the abdomen and pelvis during bolus administration of intravenous contrast. Negative oral contrast was given. CONTRAST:  76mL OMNIPAQUE IOHEXOL 300 MG/ML  SOLN COMPARISON:  08/21/2018 FINDINGS: Lower chest: Lobular area of branching nodularity in the left lung base measures 2.1 x 1.0 cm. This previously measured 2.4 x 1.5 cm in November of 2019. Signs of prior inflammation or scarring in the lingula are similar to the recent comparison study. Hepatobiliary: No signs of focal hepatic lesion. The portal vein is patent in the liver. Gallbladder is normal. No signs of biliary ductal dilation. Pancreas: Pancreas is largely fatty replaced without signs of focal lesion. No signs of peripancreatic inflammation. Spleen: Spleen is small but otherwise normal. Adrenals/Urinary Tract: Adrenal glands with mild thickening bilaterally. Collapsed cyst arising from the right kidney measuring 2.7 by 1.9 cm shows mildly thickened wall and some internal heterogeneity. This internal heterogeneity appears to be either hemorrhagic or proteinaceous or very subtly calcified and is unchanged since July of 2020. Stomach/Bowel: Signs of rule fold thickening in the stomach. Nodular enhancing focus in the second portion of the duodenum measuring 7 mm best  seen on early phase, also seen on the delayed phase with less enhancement. No additional hyperenhancing lesion in this area. Bowel distension in the distal small bowel, filled with negative enteric contrast. The this fluid passes into the colon which is also moderately distended. No site of discrete transition. Vascular/Lymphatic: Vascular structures in the abdomen are patent. Signs of calcified and  noncalcified atherosclerotic plaque of the abdominal aorta. Patent visceral branches. There is no sign of upper abdominal lymphadenopathy. No sign of retroperitoneal adenopathy. No sign of pelvic lymphadenopathy. Reproductive: Uterus in situ, with approximately 1 cm thickening of the endometrium suggested on sagittal view, nonspecific on CT. No adnexal lesion. Other: No signs of ascites. No signs of free air. Musculoskeletal: No acute bone finding or destructive bone process. IMPRESSION: 1. 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 Endoscopic assessment, perhaps even endoscopic ultrasound with potential for submucosal lesion may be warranted. 2. Signs of gastritis. 3. Signs of bowel distension likely related to ingested contrast with superimposed mild ileus. No distinct create transition. 4. Persistent branching nodularity in the left lung base, slightly decreased in size compared to the recent comparison study. 5. Collapsed cyst arising from the right kidney shows mildly thickened wall and internal heterogeneity. This is likely a collapsed partially hemorrhagic cysts, attention on follow-up. 6. 1 cm thickening of the endometrium, nonspecific on CT. Consider correlation with pelvic sonography if not performed. 7. Signs of branching nodularity in the left lung base diminished in size from more remote studies and non FDG avid. Likely post infectious scarring or mucocele. Attention on follow-up. Aortic Atherosclerosis (ICD10-I70.0). Electronically Signed   By: Zetta Bills M.D.   On: 02/02/2019 13:03     Assessment / Plan:   Assessment: 1.  Chronic diarrhea, nausea/vomiting started 07/2018: Multiple negative infectious work-ups (C. 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  Nausea and vomiting: Tolerating solid food this afternoon 3.  AKI: Improving with fluids creatinine 1.12 today 4.  Hypokalemia: Improved after 7 yesterday 5.  Normocytic anemia: Remaining stable 6.  Chronic Xarelto for PE 2019 7.  History of psoriasis  Plan: 1.  We will discuss findings of CT enterography with Dr. Bryan Lemma today.  Could possibly plan for repeat EGD/push enteroscopy tomorrow. 2.  Please await further recommendations from Dr. Bryan Lemma later today.  Thank you for your kind consultation, we will continue to follow.   LOS: 3 days   Levin Erp  02/02/2019, 1:13 PM

## 2019-02-02 NOTE — Progress Notes (Signed)
Patient c/o of rash on face,denies any itchiness. Also c/o of watery eyes with drainage especially on  the left eye. Text paged MD. Mingo Amber, RN

## 2019-02-02 NOTE — Progress Notes (Addendum)
PROGRESS NOTE                                                                                                                                                                                                             Patient Demographics:    [name], is a 73 y.o. female, DOB - May 18, 1945, YBO:[id number]  Admit date - 01/30/2019   Admitting Physician Bonnell Public, MD  Outpatient Primary MD for the patient is [name], MD  LOS - 3   Chief Complaint  Patient presents with  . Weakness       Brief Narrative    Patient is a 73 year old lady with past medical history significant for chronic diarrhea, unprovoked pulmonary embolism that was diagnosed in 2019 on Xarelto, hypertension, hyperlipidemia, documented diabetes mellitus and coronary artery disease, colonic polyp and anemia.  Patient started having diarrhea in June of this year.  Essentially, patient has continued to have diarrhea since June, has been admitted to this hospital 3 times, last admission being in November 2020.  Patient was discharged from [facility] on 01/21/2019 after 3-day stay for worsening diarrhea.  On discharge, diarrhea continued to get worse, as well as nausea and vomiting.  Work-up significant for acute kidney injury secondary to diarrhea, GI were consulted for further evaluation and recommendation for her chronic diarrhea.   Subjective:    [name] today reports some diarrhea, but appears to be improving as she had total 4-5 episodes of diarrhea over the last 24 hours, still reports some nausea .  She had some teary eyes and rash in the left cheek.   Assessment  & Plan :    Active Problems:   Non-intractable vomiting   Exocrine pancreatic insufficiency   AKI (acute kidney injury) ([place])   Protein-calorie malnutrition, severe   Hypomagnesemia   Acute kidney injury -Baseline creatinine less than 1, significantly elevated at 4.07 on  discharge. -This is most likely in the setting of this is in the setting of prerenal acidemia, and possibly ATN given episode of hypotension on presentation and clinical dehydration. -Resolved with IV fluid, back to baseline 1.1 today, will keep IV fluid going today given she will receive IV contrast for her CT abdomen and pelvis .  Chronic diarrhea: -Appears to be chronic problem, requiring multiple admissions . -So far no clear  etiology, GI input greatly appreciated . -She is back on Lomotil and Imodium with little help, back on Creon per GI recommendation. -Patient chromogranin A was elevated at [facility], suspicion for neuroendocrine tumor given lab findings, she will need PET scan gallium-68 dotatate study.  This can only be performed as an outpatient.   Have discussed with Dr. Curt Bears, her outpatient oncologist, he will arrange for outpatient follow-up to help arrange for this test, will she will start following with GI Dr. Earlean Shawl upon discharge, already having scheduled appointment 02/13/2019. -Follow on CT abdomen pelvis with IV contrast.  Nausea and vomiting: -No vomiting, on as needed meds for nausea  History of unprovoked pulmonary embolism in 2019: -According to the patient, hematology team has advised continuing Xarelto. -Due to severe AKI, he was initially on heparin gtt., now renal function has improved she can go back on DOAC, given her recurrent admission, with nausea and unreliable oral intake, Xarelto is less ideal option as it has to be taken with food, she wastransitioned to Eliquis.  Hypertension: -Patient was hypotensive on presentation, with documented blood pressure 74/56 mmHg -Continue to hold Benicar especially with AKI  severe malnutrition -in the context of chronic illness, and continue with supplement   Patient will be started on some Claritin, as he appears to be having some allergy even she has some rash and teary eyes B/L    COVID-19  Labs  No results for input(s): DDIMER, FERRITIN, LDH, CRP in the last 72 hours.  Lab Results  Component Value Date   SARSCOV2NAA NEGATIVE 01/30/2019   [place] NEGATIVE 01/04/2019   [place] NEGATIVE 08/21/2018   SARSCOV2NAA NOT DETECTED 08/01/2018     Code Status : Full  Family Communication  : D/W patient  Disposition Plan  : Home  Consults  :  GI  Procedures  : None  DVT Prophylaxis  :  Heparin GTT>> Eliquis  Lab Results  Component Value Date   PLT 218 02/02/2019    Antibiotics  :    Anti-infectives (From admission, onward)   None        Objective:   Vitals:   02/02/19 0615 02/02/19 0655 02/02/19 0858 02/02/19 1204  BP: 110/68 134/79 135/62 (!) 142/75  Pulse: 70 86 66 72  Resp: 18 18 16    Temp: 98.9 F (37.2 C)  98.8 F (37.1 C)   TempSrc: Oral  Oral   SpO2: 98% 100% 100% 100%  Weight:      Height:        Wt Readings from Last 3 Encounters:  01/31/19 49.6 kg  01/23/19 51.1 kg  01/17/19 53 kg     Intake/Output Summary (Last 24 hours) at 02/02/2019 1259 Last data filed at 02/02/2019 0900 Gross per 24 hour  Intake 1791.71 ml  Output 801 ml  Net 990.71 ml     Physical Exam  Awake Alert, Oriented X 3, No new F.N deficits, Normal affect Symmetrical Chest wall movement, Good air movement bilaterally, CTAB RRR,No Gallops,Rubs or new Murmurs, No Parasternal Heave +ve B.Sounds, Abd Soft, No tenderness, No rebound - guarding or rigidity. No Cyanosis, Clubbing or edema, No new Rash or bruise       Data Review:    CBC Recent Labs  Lab 01/30/19 1720 01/31/19 0333 02/01/19 0530 02/02/19 0506  WBC 10.1 6.5 7.2 7.1  HGB 13.4 11.0* 9.4* 9.1*  HCT 42.7 33.8* 28.3* 27.3*  PLT 296 279 277 218  MCV 97.0 94.4 91.3 92.5  MCH 30.5 30.7  30.3 30.8  MCHC 31.4 32.5 33.2 33.3  RDW 14.6 14.6 14.6 14.6  LYMPHSABS 1.4  --   --   --   MONOABS 0.6  --   --   --   EOSABS 0.0  --   --   --   BASOSABS 0.0  --   --   --     Chemistries  Recent  Labs  Lab 01/30/19 1906 01/31/19 0102 01/31/19 0333 01/31/19 0927 02/01/19 0530 02/02/19 0506  NA 135  --  137 140 144 144  K 4.7  --  4.4 3.7 2.9* 4.2  CL 112*  --  112 112* 107 112*  CO2 13*  --  11 17* 28 25  GLUCOSE 69*  --  78 105* 106* 86  BUN 48*  --  45 38* 18 12  CREATININE 4.07*  --  3.61 2.95* 1.52* 1.12*  CALCIUM 7.7*  --  8.0 8.2* 8.0* 8.0*  MG  --  0.9*  --  1.7  --   --   AST 13*  --  19 15  --  15  ALT 12  --  12 13  --  14  ALKPHOS 59  --  71 67  --  59  BILITOT 0.5  --  QUANTITY NOT SUFFICIENT, UNABLE TO PERFORM TEST 0.6  --  0.6   ------------------------------------------------------------------------------------------------------------------ No results for input(s): CHOL, HDL, LDLCALC, TRIG, CHOLHDL, LDLDIRECT in the last 72 hours.  Lab Results  Component Value Date   HGBA1C 5.8 12/19/2018   ------------------------------------------------------------------------------------------------------------------ No results for input(s): TSH, T4TOTAL, T3FREE, THYROIDAB in the last 72 hours.  Invalid input(s): FREET3 ------------------------------------------------------------------------------------------------------------------ No results for input(s): VITAMINB12, FOLATE, FERRITIN, TIBC, IRON, RETICCTPCT in the last 72 hours.  Coagulation profile No results for input(s): INR, PROTIME in the last 168 hours.  No results for input(s): DDIMER in the last 72 hours.  Cardiac Enzymes No results for input(s): CKMB, TROPONINI, MYOGLOBIN in the last 168 hours.  Invalid input(s): CK ------------------------------------------------------------------------------------------------------------------    Component Value Date/Time   BNP 40.2 12/23/2017 2339    Inpatient Medications  Scheduled Meds: . apixaban  5 mg Oral BID  . diphenoxylate-atropine  1 tablet Oral TID  . feeding supplement  1 Container Oral TID BM  . lipase/protease/amylase  36,000 Units Oral TID  WC  . loperamide  2 mg Oral QID  . loratadine  10 mg Oral Daily  . metoCLOPramide (REGLAN) injection  5 mg Intravenous Q8H  . multivitamin with minerals  1 tablet Oral Daily   Continuous Infusions: . 0.9 % NaCl with KCl 20 mEq / L 75 mL/hr at 02/02/19 0153   PRN Meds:.ondansetron (ZOFRAN) IV  Micro Results Recent Results (from the past 240 hour(s))  SARS CORONAVIRUS 2 (TAT 6-24 HRS) Nasopharyngeal Nasopharyngeal Swab     Status: None   Collection Time: 01/30/19  9:28 PM   Specimen: Nasopharyngeal Swab  Result Value Ref Range Status   SARS Coronavirus 2 NEGATIVE NEGATIVE Final    Comment: (NOTE) SARS-CoV-2 target nucleic acids are NOT DETECTED. The SARS-CoV-2 RNA is generally detectable in upper and lower respiratory specimens during the acute phase of infection. Negative results do not preclude SARS-CoV-2 infection, do not rule out co-infections with other pathogens, and should not be used as the sole basis for treatment or other patient management decisions. Negative results must be combined with clinical observations, patient history, and epidemiological information. The expected result is Negative. Fact Sheet for Patients: SugarRoll.be Fact Sheet  for Healthcare Providers: https://www.woods-mathews.com/ This test is not yet approved or cleared by the [place] and  has been authorized for detection and/or diagnosis of SARS-CoV-2 by FDA under an Emergency Use Authorization (EUA). This EUA will remain  in effect (meaning this test can be used) for the duration of the COVID-19 declaration under Section 56 4(b)(1) of the Act, 21 U.S.C. section 360bbb-3(b)(1), unless the authorization is terminated or revoked sooner. Performed at [facility], [place] [address], [place], [place] [address]     Radiology Reports US Renal  Result Date: 01/31/2019 CLINICAL DATA:  Acute kidney injury. EXAM: RENAL / URINARY TRACT ULTRASOUND  COMPLETE COMPARISON:  CT 08/21/2018 FINDINGS: Right Kidney: Renal measurements: 9.0 x 4.4 x 3.7 cm = volume: 77 mL. Echogenicity within normal limits. Cyst in the lateral mid kidney measures 2.2 x 1.8 x 1.9 cm. This is unchanged in size from prior exam. No solid mass or hydronephrosis visualized. Left Kidney: Renal measurements: 9.6 x 4.1 x 3.0 cm = volume: 63 mL. Echogenicity within normal limits. No mass or hydronephrosis visualized. Bladder: Appears normal for degree of bladder distention. Other: None. IMPRESSION: 1. No hydronephrosis or obstructive uropathy. 2. Right renal cyst. Electronically Signed   By: Keith Rake M.D.   On: 01/31/2019 00:18   DG Abd 2 Views  Result Date: 01/31/2019 CLINICAL DATA:  Diarrhea and nausea. EXAM: ABDOMEN - 2 VIEW COMPARISON:  CT 08/21/2018 FINDINGS: No bowel dilatation to suggest obstruction. No free intra-abdominal air. No radiopaque calculi or abnormal soft tissue calcifications. Lung bases are clear. Scoliotic curvature of the spine with multilevel degenerative change. IMPRESSION: Nonobstructive bowel gas pattern.  No free air. Electronically Signed   By: Keith Rake M.D.   On: 01/31/2019 00:04     Phillips Climes M.D on 02/02/2019 at 12:59 PM  Between 7am to 7pm - Pager - [phone]  After 7pm go to [email or web address] - password [facility]  [facility] -  Office  [phone]

## 2019-02-02 NOTE — Progress Notes (Signed)
MD gave verbal order for Protonix 40 mg PO daily and to include dose now. Orders placed and followed.

## 2019-02-03 LAB — BASIC METABOLIC PANEL
Anion gap: 9 (ref 5–15)
BUN: 8 mg/dL (ref 8–23)
CO2: 22 mmol/L (ref 22–32)
Calcium: 7.9 mg/dL — ABNORMAL LOW (ref 8.9–10.3)
Chloride: 107 mmol/L (ref 98–111)
Creatinine, Ser: 0.99 mg/dL (ref 0.44–1.00)
GFR calc Af Amer: >60 mL/min (ref >60)
GFR calc non Af Amer: 56 mL/min — ABNORMAL LOW (ref >60)
Glucose, Bld: 88 mg/dL (ref 70–99)
Potassium: 3.9 mmol/L (ref 3.5–5.1)
Sodium: 138 mmol/L (ref 135–145)

## 2019-02-03 LAB — CBC
HCT: 29.1 % — ABNORMAL LOW (ref 36.0–46.0)
Hemoglobin: 9.6 g/dL — ABNORMAL LOW (ref 12.0–15.0)
MCH: 30.5 pg (ref 26.0–34.0)
MCHC: 33 g/dL (ref 30.0–36.0)
MCV: 92.4 fL (ref 80.0–100.0)
Platelets: 218 10*3/uL (ref 150–400)
RBC: 3.15 MIL/uL — ABNORMAL LOW (ref 3.87–5.11)
RDW: 14.6 % (ref 11.5–15.5)
WBC: 7.3 10*3/uL (ref 4.0–10.5)
nRBC: 0 % (ref 0.0–0.2)

## 2019-02-03 MED ORDER — ENOXAPARIN SODIUM 60 MG/0.6ML ~~LOC~~ SOLN
50.0000 mg | Freq: Two times a day (BID) | SUBCUTANEOUS | Status: DC
  Administered 2019-02-04 (×3): 50 mg via SUBCUTANEOUS
  Filled 2019-02-03 (×4): qty 0.6

## 2019-02-03 NOTE — Plan of Care (Signed)
  Problem: Education: Goal: Knowledge of General Education information will improve Description: Including pain rating scale, medication(s)/side effects and non-pharmacologic comfort measures Outcome: Progressing   Problem: Activity: Goal: Risk for activity intolerance will decrease Outcome: Progressing   

## 2019-02-03 NOTE — Progress Notes (Signed)
PROGRESS NOTE                                                                                                                                                                                                             Patient Demographics:    [name], is a 73 y.o. female, DOB - 1945/07/13, VWU:[id number]  Admit date - 01/30/2019   Admitting Physician Bonnell Public, MD  Outpatient Primary MD for the patient is [name], MD  LOS - 4   Chief Complaint  Patient presents with  . Weakness       Brief Narrative    Patient is a 73 year old lady with past medical history significant for chronic diarrhea, unprovoked pulmonary embolism that was diagnosed in 2019 on Xarelto, hypertension, hyperlipidemia, documented diabetes mellitus and coronary artery disease, colonic polyp and anemia.  Patient started having diarrhea in June of this year.  Essentially, patient has continued to have diarrhea since June, has been admitted to this hospital 3 times, last admission being in November 2020.  Patient was discharged from [facility] on 01/21/2019 after 3-day stay for worsening diarrhea.  On discharge, diarrhea continued to get worse, as well as nausea and vomiting.  Work-up significant for acute kidney injury secondary to diarrhea, GI were consulted for further evaluation and recommendation for her chronic diarrhea. CT Enterography  notable for 7 mm lesion in the C-sweep of the duodenum, with appearance highly suspicious for neuroendocrine tumor.   Subjective:    [name] reports her diarrhea has improved, 4 bowel movements over last 24 hours, reports abdominal pain significantly subsided, as well oral intake has improved with minimal nausea .   Assessment  & Plan :    Active Problems:   Non-intractable vomiting   Exocrine pancreatic insufficiency   AKI (acute kidney injury) ([place])   Protein-calorie malnutrition, severe    Hypomagnesemia   Acute kidney injury -Baseline creatinine less than 1, significantly elevated at 4.07 on admission. -This is most likely in the setting of this is in the setting of prerenal acidemia, and possibly ATN given episode of hypotension on presentation and clinical dehydration. -Resolved with IV fluid, function back to baseline, she is on IV fluid given she received IV contrast yesterday, will keep going for next 24 hours.  Chronic diarrhea: -Appears to be chronic problem, requiring  multiple admissions . -She is back on Lomotil and Imodium with little help, back on Creon per GI recommendation. -GI input greatly appreciated ,patient chromogranin A was elevated at [facility], suspicion for neuroendocrine tumor given lab findings, she will need PET scan gallium-68 dotatate study.  This can only be performed as an outpatient.   Have discussed with Dr. Curt Bears, her outpatient oncologist, he will arrange for outpatient follow-up to help arrange for this test, will she will start following with GI Dr. Earlean Shawl upon discharge, already having scheduled appointment 02/13/2019. -CT Enterography  notable for 7 mm lesion in the C-sweep of the duodenum, with appearance highly suspicious for neuroendocrine tumor, would benefit from EUS for evaluation and possible consideration of EMR, with further recommendation from GI tomorrow after discussing with EUS team.  Nausea and vomiting: -Significantly improved  History of unprovoked pulmonary embolism in 2019: -According to the patient, hematology team has advised continuing Xarelto. -Due to severe AKI, he was initially on heparin gtt., now renal function has improved she can go back on DOAC, given her recurrent admission, with nausea and unreliable oral intake, Xarelto is less ideal option as it has to be taken with food, she was transition to Eliquis, I will transition back today to Lovenox if she needs any intervention given UGI  findings.  Hypertension: -Patient was hypotensive on presentation, with documented blood pressure 74/56 mmHg -Continue to hold Benicar especially with AKI  severe malnutrition -in the context of chronic illness, and continue with supplement   Patient will need to follow-up on incidental finding of renal cyst.  COVID-19 Labs  No results for input(s): DDIMER, FERRITIN, LDH, CRP in the last 72 hours.  Lab Results  Component Value Date   SARSCOV2NAA NEGATIVE 01/30/2019   [place] NEGATIVE 01/04/2019   [place] NEGATIVE 08/21/2018   SARSCOV2NAA NOT DETECTED 08/01/2018     Code Status : Full  Family Communication  : D/W patient  Disposition Plan  : Home  Consults  :  GI  Procedures  : None  DVT Prophylaxis  :  Heparin GTT>> Eliquis  Lab Results  Component Value Date   PLT 218 02/03/2019    Antibiotics  :    Anti-infectives (From admission, onward)   None        Objective:   Vitals:   02/02/19 1750 02/02/19 2050 02/03/19 0546 02/03/19 0942  BP: (!) 142/86 118/76 122/72 (!) 144/76  Pulse: 79 80 68 71  Resp: 18 16 18 18   Temp: 98.3 F (36.8 C) 98 F (36.7 C) 97.9 F (36.6 C) 98.8 F (37.1 C)  TempSrc: Oral   Oral  SpO2: 100% 96% 98% 100%  Weight:      Height:        Wt Readings from Last 3 Encounters:  01/31/19 49.6 kg  01/23/19 51.1 kg  01/17/19 53 kg     Intake/Output Summary (Last 24 hours) at 02/03/2019 1250 Last data filed at 02/03/2019 0745 Gross per 24 hour  Intake 1202.6 ml  Output 300 ml  Net 902.6 ml     Physical Exam  Awake Alert, Oriented X 3, No new F.N deficits, Normal affect Symmetrical Chest wall movement, Good air movement bilaterally, CTAB RRR,No Gallops,Rubs or new Murmurs, No Parasternal Heave +ve B.Sounds, Abd Soft, No tenderness, No rebound - guarding or rigidity. No Cyanosis, Clubbing or edema, No new Rash or bruise         Data Review:    CBC Recent Labs  Lab 01/30/19 1720  01/31/19 0333  02/01/19 0530 02/02/19 0506 02/03/19 0726  WBC 10.1 6.5 7.2 7.1 7.3  HGB 13.4 11.0* 9.4* 9.1* 9.6*  HCT 42.7 33.8* 28.3* 27.3* 29.1*  PLT 296 279 277 218 218  MCV 97.0 94.4 91.3 92.5 92.4  MCH 30.5 30.7 30.3 30.8 30.5  MCHC 31.4 32.5 33.2 33.3 33.0  RDW 14.6 14.6 14.6 14.6 14.6  LYMPHSABS 1.4  --   --   --   --   MONOABS 0.6  --   --   --   --   EOSABS 0.0  --   --   --   --   BASOSABS 0.0  --   --   --   --     Chemistries  Recent Labs  Lab 01/30/19 1906 01/31/19 0102 01/31/19 0333 01/31/19 0927 02/01/19 0530 02/02/19 0506 02/03/19 0726  NA 135  --  137 140 144 144 138  K 4.7  --  4.4 3.7 2.9* 4.2 3.9  CL 112*  --  112 112* 107 112* 107  CO2 13*  --  11 17* 28 25 22   GLUCOSE 69*  --  78 105* 106* 86 88  BUN 48*  --  45 38* 18 12 8   CREATININE 4.07*  --  3.61 2.95* 1.52* 1.12* 0.99  CALCIUM 7.7*  --  8.0 8.2* 8.0* 8.0* 7.9*  MG  --  0.9*  --  1.7  --   --   --   AST 13*  --  19 15  --  15  --   ALT 12  --  12 13  --  14  --   ALKPHOS 59  --  71 67  --  59  --   BILITOT 0.5  --  QUANTITY NOT SUFFICIENT, UNABLE TO PERFORM TEST 0.6  --  0.6  --    ------------------------------------------------------------------------------------------------------------------ No results for input(s): CHOL, HDL, LDLCALC, TRIG, CHOLHDL, LDLDIRECT in the last 72 hours.  Lab Results  Component Value Date   HGBA1C 5.8 12/19/2018   ------------------------------------------------------------------------------------------------------------------ No results for input(s): TSH, T4TOTAL, T3FREE, THYROIDAB in the last 72 hours.  Invalid input(s): FREET3 ------------------------------------------------------------------------------------------------------------------ No results for input(s): VITAMINB12, FOLATE, FERRITIN, TIBC, IRON, RETICCTPCT in the last 72 hours.  Coagulation profile No results for input(s): INR, PROTIME in the last 168 hours.  No results for input(s): DDIMER in the last  72 hours.  Cardiac Enzymes No results for input(s): CKMB, TROPONINI, MYOGLOBIN in the last 168 hours.  Invalid input(s): CK ------------------------------------------------------------------------------------------------------------------    Component Value Date/Time   BNP 40.2 12/23/2017 2339    Inpatient Medications  Scheduled Meds: . apixaban  5 mg Oral BID  . diphenoxylate-atropine  1 tablet Oral TID  . feeding supplement  1 Container Oral TID BM  . lipase/protease/amylase  36,000 Units Oral TID WC  . loperamide  2 mg Oral QID  . loratadine  10 mg Oral Daily  . metoCLOPramide (REGLAN) injection  5 mg Intravenous Q8H  . multivitamin with minerals  1 tablet Oral Daily  . pantoprazole  40 mg Oral Daily   Continuous Infusions: . sodium chloride 50 mL/hr at 02/03/19 1144   PRN Meds:.ondansetron (ZOFRAN) IV  Micro Results Recent Results (from the past 240 hour(s))  SARS CORONAVIRUS 2 (TAT 6-24 HRS) Nasopharyngeal Nasopharyngeal Swab     Status: None   Collection Time: 01/30/19  9:28 PM   Specimen: Nasopharyngeal Swab  Result Value Ref Range Status   SARS Coronavirus 2 NEGATIVE  NEGATIVE Final    Comment: (NOTE) SARS-CoV-2 target nucleic acids are NOT DETECTED. The SARS-CoV-2 RNA is generally detectable in upper and lower respiratory specimens during the acute phase of infection. Negative results do not preclude SARS-CoV-2 infection, do not rule out co-infections with other pathogens, and should not be used as the sole basis for treatment or other patient management decisions. Negative results must be combined with clinical observations, patient history, and epidemiological information. The expected result is Negative. Fact Sheet for Patients: SugarRoll.be Fact Sheet for Healthcare Providers: https://www.woods-mathews.com/ This test is not yet approved or cleared by the [place] FDA and  has been authorized for detection  and/or diagnosis of SARS-CoV-2 by FDA under an Emergency Use Authorization (EUA). This EUA will remain  in effect (meaning this test can be used) for the duration of the COVID-19 declaration under Section 56 4(b)(1) of the Act, 21 U.S.C. section 360bbb-3(b)(1), unless the authorization is terminated or revoked sooner. Performed at [facility], [address] [place], [place] [address]     Radiology Reports US PELVIS (TRANSABDOMINAL ONLY)  Result Date: 02/02/2019 CLINICAL DATA:  Endometrial thickening seen on CT imaging. EXAM: TRANSABDOMINAL ULTRASOUND OF PELVIS TECHNIQUE: Transabdominal ultrasound examination of the pelvis was performed including evaluation of the uterus, ovaries, adnexal regions, and pelvic cul-de-sac. COMPARISON:  None. FINDINGS: Uterus Measurements: 5.6 x 3.0 x 4.5 cm = volume: 39.6 mL. No fibroids or other mass visualized. Endometrium Thickness: 9.2 mm. There appears to be fluid in the endometrial canal included in the 9.2 mm thickness measurement. Right ovary Not visualized. Left ovary Not visualized. Other findings:  No abnormal free fluid. IMPRESSION: 1. The endometrial stripe complex was measured at 9.2 mm in thickness. However, I believe this contains fluid within the canal. The endometrium itself does not appear to be particularly thickened. However, evaluation is limited based on transabdominal imaging. Recommend endovaginal imaging for more complete evaluation. Electronically Signed   By: Dorise Bullion III M.D   On: 02/02/2019 16:41   US Renal  Result Date: 01/31/2019 CLINICAL DATA:  Acute kidney injury. EXAM: RENAL / URINARY TRACT ULTRASOUND COMPLETE COMPARISON:  CT 08/21/2018 FINDINGS: Right Kidney: Renal measurements: 9.0 x 4.4 x 3.7 cm = volume: 77 mL. Echogenicity within normal limits. Cyst in the lateral mid kidney measures 2.2 x 1.8 x 1.9 cm. This is unchanged in size from prior exam. No solid mass or hydronephrosis visualized. Left Kidney: Renal  measurements: 9.6 x 4.1 x 3.0 cm = volume: 63 mL. Echogenicity within normal limits. No mass or hydronephrosis visualized. Bladder: Appears normal for degree of bladder distention. Other: None. IMPRESSION: 1. No hydronephrosis or obstructive uropathy. 2. Right renal cyst. Electronically Signed   By: Keith Rake M.D.   On: 01/31/2019 00:18   CT ENTERO ABD/PELVIS W CONTAST  Result Date: 02/02/2019 CLINICAL DATA:  Diarrhea for months, elevated markers of inflammation, enteritis on CT of July of 2020 EXAM: CT ABDOMEN AND PELVIS WITH CONTRAST (ENTEROGRAPHY) TECHNIQUE: Multidetector CT of the abdomen and pelvis during bolus administration of intravenous contrast. Negative oral contrast was given. CONTRAST:  69mL OMNIPAQUE IOHEXOL 300 MG/ML  SOLN COMPARISON:  08/21/2018 FINDINGS: Lower chest: Lobular area of branching nodularity in the left lung base measures 2.1 x 1.0 cm. This previously measured 2.4 x 1.5 cm in November of 2019. Signs of prior inflammation or scarring in the lingula are similar to the recent comparison study. Hepatobiliary: No signs of focal hepatic lesion. The portal vein is patent in the  liver. Gallbladder is normal. No signs of biliary ductal dilation. Pancreas: Pancreas is largely fatty replaced without signs of focal lesion. No signs of peripancreatic inflammation. Spleen: Spleen is small but otherwise normal. Adrenals/Urinary Tract: Adrenal glands with mild thickening bilaterally. Collapsed cyst arising from the right kidney measuring 2.7 by 1.9 cm shows mildly thickened wall and some internal heterogeneity. This internal heterogeneity appears to be either hemorrhagic or proteinaceous or very subtly calcified and is unchanged since July of 2020. Stomach/Bowel: Signs of rule fold thickening in the stomach. Nodular enhancing focus in the second portion of the duodenum measuring 7 mm best seen on early phase, also seen on the delayed phase with less enhancement. No additional hyperenhancing  lesion in this area. Bowel distension in the distal small bowel, filled with negative enteric contrast. The this fluid passes into the colon which is also moderately distended. No site of discrete transition. Vascular/Lymphatic: Vascular structures in the abdomen are patent. Signs of calcified and noncalcified atherosclerotic plaque of the abdominal aorta. Patent visceral branches. There is no sign of upper abdominal lymphadenopathy. No sign of retroperitoneal adenopathy. No sign of pelvic lymphadenopathy. Reproductive: Uterus in situ, with approximately 1 cm thickening of the endometrium suggested on sagittal view, nonspecific on CT. No adnexal lesion. Other: No signs of ascites. No signs of free air. Musculoskeletal: No acute bone finding or destructive bone process. IMPRESSION: 1. 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 Endoscopic assessment, perhaps even endoscopic ultrasound with potential for submucosal lesion may be warranted. 2. Signs of gastritis. 3. Signs of bowel distension likely related to ingested contrast with superimposed mild ileus. No distinct create transition. 4. Persistent branching nodularity in the left lung base, slightly decreased in size compared to the recent comparison study. 5. Collapsed cyst arising from the right kidney shows mildly thickened wall and internal heterogeneity. This is likely a collapsed partially hemorrhagic cysts, attention on follow-up. 6. 1 cm thickening of the endometrium, nonspecific on CT. Consider correlation with pelvic sonography if not performed. 7. Signs of branching nodularity in the left lung base diminished in size from more remote studies and non FDG avid. Likely post infectious scarring or mucocele. Attention on follow-up. Aortic Atherosclerosis (ICD10-I70.0). Electronically Signed   By: Zetta Bills M.D.   On: 02/02/2019 13:03   DG Abd 2  Views  Result Date: 01/31/2019 CLINICAL DATA:  Diarrhea and nausea. EXAM: ABDOMEN - 2 VIEW COMPARISON:  CT 08/21/2018 FINDINGS: No bowel dilatation to suggest obstruction. No free intra-abdominal air. No radiopaque calculi or abnormal soft tissue calcifications. Lung bases are clear. Scoliotic curvature of the spine with multilevel degenerative change. IMPRESSION: Nonobstructive bowel gas pattern.  No free air. Electronically Signed   By: Keith Rake M.D.   On: 01/31/2019 00:04     Phillips Climes M.D on 02/03/2019 at 12:50 PM  Between 7am to 7pm - Pager - [phone]  After 7pm go to [email or web address] - password [email or web address]  [facility] -  Office  [phone]

## 2019-02-03 NOTE — Progress Notes (Signed)
[place] for apixaban >> Lovenox Indication: history of PE on Xarelto PTA  Heparin Dosing Weight: 51 kg  Labs: Recent Labs    01/31/19 1630 02/01/19 0530 02/02/19 0506 02/03/19 0726  HGB  --  9.4* 9.1* 9.6*  HCT  --  28.3* 27.3* 29.1*  PLT  --  277 218 218  APTT 104* 153*  --   --   HEPARINUNFRC  --  0.41  --   --   CREATININE  --  1.52* 1.12* 0.99    Assessment: 73 yo F with history of PE on Xarelto PTA presenting with weakness, N/V/D. Patient was started on Eliquis, but pharmacy has now been consulted to transition patient from Eliquis to Lovenox in anticipation of GI intervention. Will likely resume Eliquis afterward.   Last Eliquis dose 12/20 at 1137. CBC stable. No active bleeding issues documented. Renal function stable   Goal of Therapy:  Therapeutic anticoagulation Monitor platelets by anticoagulation protocol: Yes   Plan:  - Discontinue Eliquis in anticipation of GI intervention in the next few days - Start Lovenox 1 mg/kg q12hr this evening - Monitor CBC, renal function, s/sx of bleeding - F/u anticoagulation plans   Agnes Lawrence, PharmD PGY1 Pharmacy Resident

## 2019-02-04 ENCOUNTER — Telehealth: Payer: Self-pay | Admitting: Internal Medicine

## 2019-02-04 ENCOUNTER — Encounter: Payer: Self-pay | Admitting: Gastroenterology

## 2019-02-04 LAB — GI PATHOGEN PANEL BY PCR, STOOL

## 2019-02-04 LAB — OVA + PARASITE EXAM

## 2019-02-04 LAB — COMPREHENSIVE METABOLIC PANEL
ALT: 15 U/L (ref 0–44)
AST: 17 U/L (ref 15–41)
Albumin: 2.1 g/dL — ABNORMAL LOW (ref 3.5–5.0)
Alkaline Phosphatase: 63 U/L (ref 38–126)
Anion gap: 8 (ref 5–15)
BUN: 6 mg/dL — ABNORMAL LOW (ref 8–23)
CO2: 25 mmol/L (ref 22–32)
Calcium: 7.8 mg/dL — ABNORMAL LOW (ref 8.9–10.3)
Chloride: 108 mmol/L (ref 98–111)
Creatinine, Ser: 0.93 mg/dL (ref 0.44–1.00)
GFR calc Af Amer: >60 mL/min (ref >60)
GFR calc non Af Amer: >60 mL/min (ref >60)
Glucose, Bld: 85 mg/dL (ref 70–99)
Potassium: 3.8 mmol/L (ref 3.5–5.1)
Sodium: 141 mmol/L (ref 135–145)
Total Bilirubin: 0.7 mg/dL (ref 0.3–1.2)
Total Protein: 4.7 g/dL — ABNORMAL LOW (ref 6.5–8.1)

## 2019-02-04 LAB — GLUCOSE, CAPILLARY: Glucose-Capillary: 82 mg/dL (ref 70–99)

## 2019-02-04 LAB — CBC
HCT: 28.4 % — ABNORMAL LOW (ref 36.0–46.0)
Hemoglobin: 9.2 g/dL — ABNORMAL LOW (ref 12.0–15.0)
MCH: 29.9 pg (ref 26.0–34.0)
MCHC: 32.4 g/dL (ref 30.0–36.0)
MCV: 92.2 fL (ref 80.0–100.0)
Platelets: 204 10*3/uL (ref 150–400)
RBC: 3.08 MIL/uL — ABNORMAL LOW (ref 3.87–5.11)
RDW: 14.4 % (ref 11.5–15.5)
WBC: 7.1 10*3/uL (ref 4.0–10.5)
nRBC: 0 % (ref 0.0–0.2)

## 2019-02-04 LAB — O&P RESULT

## 2019-02-04 MED ORDER — HYDRALAZINE HCL 10 MG PO TABS
10.0000 mg | ORAL_TABLET | Freq: Three times a day (TID) | ORAL | Status: DC
  Filled 2019-02-04: qty 1

## 2019-02-04 NOTE — Telephone Encounter (Signed)
Scheduled appt per 12/21 sch message - unable to reach pt . Left message with appt date and time

## 2019-02-04 NOTE — Plan of Care (Signed)
  Problem: Education: Goal: Knowledge of General Education information will improve Description: Including pain rating scale, medication(s)/side effects and non-pharmacologic comfort measures Outcome: Progressing   Problem: Elimination: Goal: Will not experience complications related to bowel motility Outcome: Progressing   

## 2019-02-04 NOTE — Plan of Care (Signed)
  Problem: Nutrition: Goal: Adequate nutrition will be maintained Outcome: Progressing   Problem: Elimination: Goal: Will not experience complications related to bowel motility Outcome: Progressing   

## 2019-02-04 NOTE — Progress Notes (Signed)
     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 Elevated chromogranin A.  Has appt with Dr. Earlean Shawl 02/13/2019 and Dr. Julien Nordmann (oncology) Chronic diarrhea (improved) Assoc H/O PE 2019 on Eliquis, HTN, HLD DM2, CAD, CKD   Plan: -Unable to get EUS today.  Will arrange for EUS (Dr. Rush Landmark) off Eliquis (x 48 hrs, GFR 48 ml/min) as outpatient.  I have discussed with him. -She would require DOTATE PET scan as an outpatient. (Cannot be done as inpt) -Continue supportive treatment for now -Advance diet. -Resume Eliquis -Stop protonix. -Resume rest of the medications including Imodium.  Add Lomotil if still with diarrhea at home. -Forward all records/imaging studies to Dr. Earlean Shawl.   SUBJECTIVE   Feels much better today. No abdominal pain The diarrhea has resolved. She is tolerating full liquid diet without any problems.    OBJECTIVE:     Vital signs in last 24 hours: Temp:  [97.9 F (36.6 C)-99.2 F (37.3 C)] 97.9 F (36.6 C) (12/21 1732) Pulse Rate:  [60-77] 75 (12/21 1732) Resp:  [16-18] 18 (12/21 1732) BP: (145-162)/(68-88) 159/88 (12/21 1732) SpO2:  [94 %-95 %] 95 % (12/21 1732) Last BM Date: 02/04/19 General:   Alert, well-developed female in NAD EENT:  Normal hearing, non icteric sclera, conjunctive pink.  Heart:  Regular rate and rhythm; no murmur.  No lower extremity edema   Pulm: Normal respiratory effort, lungs CTA bilaterally without wheezes or crackles. Abdomen:  Soft, nondistended, nontender.  Normal bowel sounds,.       Neurologic:  Alert and  oriented x4;  grossly normal neurologically. Psych:  Pleasant, cooperative.  Normal mood and affect.   Intake/Output from previous day: 12/20 0701 - 12/21 0700 In: 1810.1 [P.O.:600; I.V.:1210.1] Out: 1250 [Urine:1250] Intake/Output this shift: Total I/O In: 568.8 [P.O.:120; I.V.:418.8; Other:30] Out: 182 [XHBZJ:696]  Lab Results: Recent Labs    02/02/19 0506 02/03/19 0726  02/04/19 0345  WBC 7.1 7.3 7.1  HGB 9.1* 9.6* 9.2*  HCT 27.3* 29.1* 28.4*  PLT 218 218 204   BMET Recent Labs    02/02/19 0506 02/03/19 0726 02/04/19 0345  NA 144 138 141  K 4.2 3.9 3.8  CL 112* 107 108  CO2 25 22 25   GLUCOSE 86 88 85  BUN 12 8 6*  CREATININE 1.12* 0.99 0.93  CALCIUM 8.0* 7.9* 7.8*   LFT Recent Labs    02/04/19 0345  PROT 4.7*  ALBUMIN 2.1*  AST 17  ALT 15  ALKPHOS 63  BILITOT 0.7   PT/INR No results for input(s): LABPROT, INR in the last 72 hours. Hepatitis Panel No results for input(s): HEPBSAG, HCVAB, HEPAIGM, HEPBIGM in the last 72 hours.  No results found.   Active Problems:   Non-intractable vomiting   Exocrine pancreatic insufficiency   AKI (acute kidney injury) ([place])   Protein-calorie malnutrition, severe   Hypomagnesemia     LOS: 5 days     Carmell Austria, MD 02/04/2019, 6:16 PM [facility] [phone]

## 2019-02-04 NOTE — Progress Notes (Signed)
PROGRESS NOTE                                                                                                                                                                                                             Patient Demographics:    [name], is a 73 y.o. female, DOB - 1945/10/30, QBH:[id number]  Admit date - 01/30/2019   Admitting Physician Bonnell Public, MD  Outpatient Primary MD for the patient is [name], MD  LOS - 5   Chief Complaint  Patient presents with  . Weakness       Brief Narrative    Patient is a 73 year old lady with past medical history significant for chronic diarrhea, unprovoked pulmonary embolism that was diagnosed in 2019 on Xarelto, hypertension, hyperlipidemia, documented diabetes mellitus and coronary artery disease, colonic polyp and anemia.  Patient started having diarrhea in June of this year.  Essentially, patient has continued to have diarrhea since June, has been admitted to this hospital 3 times, last admission being in November 2020.  Patient was discharged from [facility] on 01/21/2019 after 3-day stay for worsening diarrhea.  On discharge, diarrhea continued to get worse, as well as nausea and vomiting.  Work-up significant for acute kidney injury secondary to diarrhea, GI were consulted for further evaluation and recommendation for her chronic diarrhea. CT Enterography  notable for 7 mm lesion in the C-sweep of the duodenum, with appearance highly suspicious for neuroendocrine tumor.   Subjective:    [name] reports diarrhea has improved, abdominal pain almost resolved, report minimal nausea with good oral intake .   Assessment  & Plan :    Active Problems:   Non-intractable vomiting   Exocrine pancreatic insufficiency   AKI (acute kidney injury) ([facility])   Protein-calorie malnutrition, severe   Hypomagnesemia   Acute kidney injury -Baseline creatinine less than 1,  significantly elevated at 4.07 on admission. -This is most likely in the setting of this is in the setting of prerenal acidemia, and possibly ATN given episode of hypotension on presentation and clinical dehydration. -Resolved with IV fluids  Chronic diarrhea: -Appears to be chronic problem, requiring multiple admissions . -She is back on Lomotil and Imodium with little help, back on Creon per GI recommendation. -GI input greatly appreciated ,patient chromogranin A was elevated at [facility], suspicion for neuroendocrine  tumor given lab findings, she will need PET scan gallium-68 dotatate study.  This can only be performed as an outpatient.   Have discussed with Dr. Curt Bears, her outpatient oncologist, he will arrange for outpatient follow-up to help arrange for this test, will she will start following with GI Dr. Earlean Shawl upon discharge, already having scheduled appointment 02/13/2019. -CT Enterography  notable for 7 mm lesion in the C-sweep of the duodenum, with appearance highly suspicious for neuroendocrine tumor, would benefit from EUS for evaluation and possible consideration of EMR, with further recommendation from GI tomorrow after discussing with EUS team.  Nausea and vomiting: -Significantly improved  History of unprovoked pulmonary embolism in 2019: -According to the patient, hematology team has advised continuing Xarelto. -Due to severe AKI, he was initially on heparin gtt., now renal function has improved she can go back on DOAC, given her recurrent admission, with nausea and unreliable oral intake, Xarelto is less ideal option as it has to be taken with food, she was transition to Eliquis, I will transition back today to Lovenox if she needs any intervention given GI findings.  Hypertension: -Patient was hypotensive on presentation, with documented blood pressure 74/56 mmHg -Continue to hold Benicar especially with AKI -Blood pressure started to increase, will start on low-dose  hydralazine  severe malnutrition -in the context of chronic illness, and continue with supplement   Patient will need to follow-up on incidental finding of renal cyst.  COVID-19 Labs  No results for input(s): DDIMER, FERRITIN, LDH, CRP in the last 72 hours.  Lab Results  Component Value Date   SARSCOV2NAA NEGATIVE 01/30/2019   [place] NEGATIVE 01/04/2019   [place] NEGATIVE 08/21/2018   SARSCOV2NAA NOT DETECTED 08/01/2018     Code Status : Full  Family Communication  : D/W patient  Disposition Plan  : Home  Consults  :  GI  Procedures  : None  DVT Prophylaxis  :  Heparin GTT>> Eliquis>> lovenox  Lab Results  Component Value Date   PLT 204 02/04/2019    Antibiotics  :    Anti-infectives (From admission, onward)   None        Objective:   Vitals:   02/03/19 1704 02/03/19 2047 02/04/19 0510 02/04/19 0924  BP: 140/75 (!) 145/68 (!) 154/80 (!) 162/76  Pulse: 67 65 77 60  Resp: 16 18 16 18   Temp: 98.7 F (37.1 C) 99.2 F (37.3 C) 98.2 F (36.8 C) 98.2 F (36.8 C)  TempSrc: Oral Oral Oral Oral  SpO2: 99% 94% 95% 95%  Weight:      Height:        Wt Readings from Last 3 Encounters:  01/31/19 49.6 kg  01/23/19 51.1 kg  01/17/19 53 kg     Intake/Output Summary (Last 24 hours) at 02/04/2019 1341 Last data filed at 02/04/2019 1200 Gross per 24 hour  Intake 1656.27 ml  Output 1150 ml  Net 506.27 ml     Physical Exam   Awake Alert, Oriented X 3, No new F.N deficits, Normal affect Symmetrical Chest wall movement, Good air movement bilaterally, CTAB RRR,No Gallops,Rubs or new Murmurs, No Parasternal Heave +ve B.Sounds, Abd Soft, No tenderness, No rebound - guarding or rigidity. No Cyanosis, Clubbing or edema, No new Rash or bruise      Data Review:    CBC Recent Labs  Lab 01/30/19 1720 01/31/19 0333 02/01/19 0530 02/02/19 0506 02/03/19 0726 02/04/19 0345  WBC 10.1 6.5 7.2 7.1 7.3 7.1  HGB 13.4 11.0* 9.4*  9.1* 9.6* 9.2*  HCT  42.7 33.8* 28.3* 27.3* 29.1* 28.4*  PLT 296 279 277 218 218 204  MCV 97.0 94.4 91.3 92.5 92.4 92.2  MCH 30.5 30.7 30.3 30.8 30.5 29.9  MCHC 31.4 32.5 33.2 33.3 33.0 32.4  RDW 14.6 14.6 14.6 14.6 14.6 14.4  LYMPHSABS 1.4  --   --   --   --   --   MONOABS 0.6  --   --   --   --   --   EOSABS 0.0  --   --   --   --   --   BASOSABS 0.0  --   --   --   --   --     Chemistries  Recent Labs  Lab 01/30/19 1906 01/31/19 0102 01/31/19 0333 01/31/19 0927 02/01/19 0530 02/02/19 0506 02/03/19 0726 02/04/19 0345  NA 135  --  137 140 144 144 138 141  K 4.7  --  4.4 3.7 2.9* 4.2 3.9 3.8  CL 112*  --  112 112* 107 112* 107 108  CO2 13*  --  11 17* 28 25 22 25   GLUCOSE 69*  --  78 105* 106* 86 88 85  BUN 48*  --  45 38* 18 12 8  6*  CREATININE 4.07*  --  3.61 2.95* 1.52* 1.12* 0.99 0.93  CALCIUM 7.7*  --  8.0 8.2* 8.0* 8.0* 7.9* 7.8*  MG  --  0.9*  --  1.7  --   --   --   --   AST 13*  --  19 15  --  15  --  17  ALT 12  --  12 13  --  14  --  15  ALKPHOS 59  --  71 67  --  59  --  63  BILITOT 0.5  --  QUANTITY NOT SUFFICIENT, UNABLE TO PERFORM TEST 0.6  --  0.6  --  0.7   ------------------------------------------------------------------------------------------------------------------ No results for input(s): CHOL, HDL, LDLCALC, TRIG, CHOLHDL, LDLDIRECT in the last 72 hours.  Lab Results  Component Value Date   HGBA1C 5.8 12/19/2018   ------------------------------------------------------------------------------------------------------------------ No results for input(s): TSH, T4TOTAL, T3FREE, THYROIDAB in the last 72 hours.  Invalid input(s): FREET3 ------------------------------------------------------------------------------------------------------------------ No results for input(s): VITAMINB12, FOLATE, FERRITIN, TIBC, IRON, RETICCTPCT in the last 72 hours.  Coagulation profile No results for input(s): INR, PROTIME in the last 168 hours.  No results for input(s): DDIMER in the  last 72 hours.  Cardiac Enzymes No results for input(s): CKMB, TROPONINI, MYOGLOBIN in the last 168 hours.  Invalid input(s): CK ------------------------------------------------------------------------------------------------------------------    Component Value Date/Time   BNP 40.2 12/23/2017 2339    Inpatient Medications  Scheduled Meds: . diphenoxylate-atropine  1 tablet Oral TID  . enoxaparin (LOVENOX) injection  50 mg Subcutaneous Q12H  . feeding supplement  1 Container Oral TID BM  . lipase/protease/amylase  36,000 Units Oral TID WC  . loperamide  2 mg Oral QID  . loratadine  10 mg Oral Daily  . multivitamin with minerals  1 tablet Oral Daily  . pantoprazole  40 mg Oral Daily   Continuous Infusions: . sodium chloride 50 mL/hr at 02/04/19 1200   PRN Meds:.ondansetron (ZOFRAN) IV  Micro Results Recent Results (from the past 240 hour(s))  SARS CORONAVIRUS 2 (TAT 6-24 HRS) Nasopharyngeal Nasopharyngeal Swab     Status: None   Collection Time: 01/30/19  9:28 PM   Specimen: Nasopharyngeal Swab  Result Value Ref  Range Status   SARS Coronavirus 2 NEGATIVE NEGATIVE Final    Comment: (NOTE) SARS-CoV-2 target nucleic acids are NOT DETECTED. The SARS-CoV-2 RNA is generally detectable in upper and lower respiratory specimens during the acute phase of infection. Negative results do not preclude SARS-CoV-2 infection, do not rule out co-infections with other pathogens, and should not be used as the sole basis for treatment or other patient management decisions. Negative results must be combined with clinical observations, patient history, and epidemiological information. The expected result is Negative. Fact Sheet for Patients: SugarRoll.be Fact Sheet for Healthcare Providers: https://www.woods-mathews.com/ This test is not yet approved or cleared by the [place] FDA and  has been authorized for detection and/or diagnosis of  SARS-CoV-2 by FDA under an Emergency Use Authorization (EUA). This EUA will remain  in effect (meaning this test can be used) for the duration of the COVID-19 declaration under Section 56 4(b)(1) of the Act, 21 U.S.C. section 360bbb-3(b)(1), unless the authorization is terminated or revoked sooner. Performed at [facility], [place] [address], [place], [place] [address]     Radiology Reports US PELVIS (TRANSABDOMINAL ONLY)  Result Date: 02/02/2019 CLINICAL DATA:  Endometrial thickening seen on CT imaging. EXAM: TRANSABDOMINAL ULTRASOUND OF PELVIS TECHNIQUE: Transabdominal ultrasound examination of the pelvis was performed including evaluation of the uterus, ovaries, adnexal regions, and pelvic cul-de-sac. COMPARISON:  None. FINDINGS: Uterus Measurements: 5.6 x 3.0 x 4.5 cm = volume: 39.6 mL. No fibroids or other mass visualized. Endometrium Thickness: 9.2 mm. There appears to be fluid in the endometrial canal included in the 9.2 mm thickness measurement. Right ovary Not visualized. Left ovary Not visualized. Other findings:  No abnormal free fluid. IMPRESSION: 1. The endometrial stripe complex was measured at 9.2 mm in thickness. However, I believe this contains fluid within the canal. The endometrium itself does not appear to be particularly thickened. However, evaluation is limited based on transabdominal imaging. Recommend endovaginal imaging for more complete evaluation. Electronically Signed   By: Dorise Bullion III M.D   On: 02/02/2019 16:41   US Renal  Result Date: 01/31/2019 CLINICAL DATA:  Acute kidney injury. EXAM: RENAL / URINARY TRACT ULTRASOUND COMPLETE COMPARISON:  CT 08/21/2018 FINDINGS: Right Kidney: Renal measurements: 9.0 x 4.4 x 3.7 cm = volume: 77 mL. Echogenicity within normal limits. Cyst in the lateral mid kidney measures 2.2 x 1.8 x 1.9 cm. This is unchanged in size from prior exam. No solid mass or hydronephrosis visualized. Left Kidney: Renal measurements: 9.6 x 4.1  x 3.0 cm = volume: 63 mL. Echogenicity within normal limits. No mass or hydronephrosis visualized. Bladder: Appears normal for degree of bladder distention. Other: None. IMPRESSION: 1. No hydronephrosis or obstructive uropathy. 2. Right renal cyst. Electronically Signed   By: Keith Rake M.D.   On: 01/31/2019 00:18   CT ENTERO ABD/PELVIS W CONTAST  Result Date: 02/02/2019 CLINICAL DATA:  Diarrhea for months, elevated markers of inflammation, enteritis on CT of July of 2020 EXAM: CT ABDOMEN AND PELVIS WITH CONTRAST (ENTEROGRAPHY) TECHNIQUE: Multidetector CT of the abdomen and pelvis during bolus administration of intravenous contrast. Negative oral contrast was given. CONTRAST:  21mL OMNIPAQUE IOHEXOL 300 MG/ML  SOLN COMPARISON:  08/21/2018 FINDINGS: Lower chest: Lobular area of branching nodularity in the left lung base measures 2.1 x 1.0 cm. This previously measured 2.4 x 1.5 cm in November of 2019. Signs of prior inflammation or scarring in the lingula are similar to the recent comparison study. Hepatobiliary: No signs of focal hepatic  lesion. The portal vein is patent in the liver. Gallbladder is normal. No signs of biliary ductal dilation. Pancreas: Pancreas is largely fatty replaced without signs of focal lesion. No signs of peripancreatic inflammation. Spleen: Spleen is small but otherwise normal. Adrenals/Urinary Tract: Adrenal glands with mild thickening bilaterally. Collapsed cyst arising from the right kidney measuring 2.7 by 1.9 cm shows mildly thickened wall and some internal heterogeneity. This internal heterogeneity appears to be either hemorrhagic or proteinaceous or very subtly calcified and is unchanged since July of 2020. Stomach/Bowel: Signs of rule fold thickening in the stomach. Nodular enhancing focus in the second portion of the duodenum measuring 7 mm best seen on early phase, also seen on the delayed phase with less enhancement. No additional hyperenhancing lesion in this area.  Bowel distension in the distal small bowel, filled with negative enteric contrast. The this fluid passes into the colon which is also moderately distended. No site of discrete transition. Vascular/Lymphatic: Vascular structures in the abdomen are patent. Signs of calcified and noncalcified atherosclerotic plaque of the abdominal aorta. Patent visceral branches. There is no sign of upper abdominal lymphadenopathy. No sign of retroperitoneal adenopathy. No sign of pelvic lymphadenopathy. Reproductive: Uterus in situ, with approximately 1 cm thickening of the endometrium suggested on sagittal view, nonspecific on CT. No adnexal lesion. Other: No signs of ascites. No signs of free air. Musculoskeletal: No acute bone finding or destructive bone process. IMPRESSION: 1. 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 Endoscopic assessment, perhaps even endoscopic ultrasound with potential for submucosal lesion may be warranted. 2. Signs of gastritis. 3. Signs of bowel distension likely related to ingested contrast with superimposed mild ileus. No distinct create transition. 4. Persistent branching nodularity in the left lung base, slightly decreased in size compared to the recent comparison study. 5. Collapsed cyst arising from the right kidney shows mildly thickened wall and internal heterogeneity. This is likely a collapsed partially hemorrhagic cysts, attention on follow-up. 6. 1 cm thickening of the endometrium, nonspecific on CT. Consider correlation with pelvic sonography if not performed. 7. Signs of branching nodularity in the left lung base diminished in size from more remote studies and non FDG avid. Likely post infectious scarring or mucocele. Attention on follow-up. Aortic Atherosclerosis (ICD10-I70.0). Electronically Signed   By: Zetta Bills M.D.   On: 02/02/2019 13:03   DG Abd 2 Views  Result Date:  01/31/2019 CLINICAL DATA:  Diarrhea and nausea. EXAM: ABDOMEN - 2 VIEW COMPARISON:  CT 08/21/2018 FINDINGS: No bowel dilatation to suggest obstruction. No free intra-abdominal air. No radiopaque calculi or abnormal soft tissue calcifications. Lung bases are clear. Scoliotic curvature of the spine with multilevel degenerative change. IMPRESSION: Nonobstructive bowel gas pattern.  No free air. Electronically Signed   By: Keith Rake M.D.   On: 01/31/2019 00:04     Phillips Climes M.D on 02/04/2019 at 1:41 PM  Between 7am to 7pm - Pager - [phone]  After 7pm go to [email or web address] - password [email or web address]  [facility] -  Office  [phone]

## 2019-02-04 NOTE — Progress Notes (Signed)
I was asked by the inpatient GI team to evaluate the patient's chart and imaging.  Looks like she has had an extensive work-up for her diarrheal symptoms.  Has had noted outpatient 5-HIAA a elevation and mildly elevated chromogranin though has been on PPI therapy. I reviewed the CT imaging jesting a potential lesion in the D2-D3 junction and the concern for whether neuroendocrine tumor could be present. I am willing to consider an outpatient endoscopic ultrasound with possible EMR if we can get the EUS scope down there and/your looks like it can be resected.  That particular area can be quite difficult to get an endoscopic ultrasound camera to so there is a possibility that we may not be successful. I would be pursuing and evaluating this patient in the outpatient setting as a consultant/referral I would not become her primary gastroenterologist. I have discussed this with Dr. Lyndel Safe who is the current inpatient GI team. The patient has an outpatient referral pending with GI at [facility] with Dr. Earlean Shawl and she should continue to keep that. Again, I am willing to be a potential referral for endoscopic approach to evaluation and potential resection but I would not become her primary gastroenterologist.  Chong Sicilian or covering RN, please reach out to the patient in the next week (likely discharge tomorrow or the next day) and work on scheduling a tele visit with the patient and then subsequently in January look for a 2-hour enteroscopy/EUS with possible EMR slot. Thank you.  Justice Britain, MD [facility] Office # [phone]

## 2019-02-05 MED ORDER — ONDANSETRON 8 MG PO TBDP: 8.0000 mg | ORAL_TABLET | Freq: Three times a day (TID) | ORAL | 0 refills | Status: DC | PRN

## 2019-02-05 MED ORDER — PANCRELIPASE (LIP-PROT-AMYL) 36000-114000 UNITS PO CPEP: 36000.0000 [IU] | ORAL_CAPSULE | Freq: Three times a day (TID) | ORAL | 0 refills | Status: DC

## 2019-02-05 MED ORDER — ONDANSETRON HCL 4 MG PO TABS
4.0000 mg | ORAL_TABLET | Freq: Four times a day (QID) | ORAL | Status: DC
  Administered 2019-02-05: 4 mg via ORAL
  Filled 2019-02-05: qty 1

## 2019-02-05 MED ORDER — APIXABAN 5 MG PO TABS
5.0000 mg | ORAL_TABLET | Freq: Two times a day (BID) | ORAL | Status: DC
  Administered 2019-02-05: 5 mg via ORAL
  Filled 2019-02-05: qty 1

## 2019-02-05 MED ORDER — LOPERAMIDE HCL 2 MG PO CAPS: 2.0000 mg | ORAL_CAPSULE | Freq: Two times a day (BID) | ORAL | 0 refills | Status: DC

## 2019-02-05 MED FILL — ONDANSETRON ODT 8 MG TABLET: 8 | 10 days supply | Qty: 30 | Fill #0

## 2019-02-05 MED FILL — CREON DR 12,000 UNITS CAP: 12000 | 30 days supply | Qty: 270 | Fill #0

## 2019-02-05 MED FILL — LOPERAMIDE 2 MG CAPSULE: 2 | 15 days supply | Qty: 30 | Fill #0

## 2019-02-05 NOTE — Care Management Important Message (Signed)
Important Message  Patient Details  Name: [name] MRN: [id number] Date of Birth: Apr 21, 1945   Medicare Important Message Given:  Yes     Caelan Atchley Montine Circle 02/05/2019, 3:33 PM

## 2019-02-05 NOTE — Progress Notes (Signed)
Subjective:    Patient ID: [name], female    DOB: [birth year], 73 y.o.   MRN: [id number]  HPI The patient is here for follow up from the hospital.  Admitted 01/30/2019-02/05/2019.  Admitted for dehydration, AKI, diarrhea.  She needs follow up CBC, CMP To f/u with Dr Earlean Shawl as scheduled [facility] to do a enteroscopy/EUS Follow up with oncology - PET scan Partially collapsed right renal cyst   She went to the ED for persistent diarrhea, nausea and vomiting.  She was not able to keep anything down, was dehydrated and was losing weight.  GI was consulted.  Ct enterography notable for 7 mm lesion in C-sweep of duodenum, appearance highly suspicious for neuroendocrine tumor.  Will have EUS as an outpatient.  She will see oncology and have a PET.     Chronic diarrhea: Chronic, multiple admissions Has had thorough w/u Patient chromogranin A was elevated At [place] for neuroendocrine tumor Will need PET scan Will see Dr Earlean Shawl as outpatient - scheduled [facility] to do EUS and possible EMR Diarrhea improved  Discharged on lomotil and imodium  AKI: Cr 4.07 on admission Likely prerenal acidemia, possible ATN given hypotension and dehydration Improved and back to normal on discharge  Nausea, vomiting: Significantly improved Minimal on discharge zofran prn  H/o PE, unporvoked Placed on heparin gtt due to AKI Placed back on DOAC - xarelto changed to eliquis   Hypertension: BP in ED  74/56 BP improved benicar restarted  Severe malnutrition Related to chronic illness  Renal cyst: Partially collapsed right renal cyst, outpatient follow up Unchanged since July 2020    She is having 2-3 watery diarrhea episodes a day.  She is having fecal incontinence.  She has constant nausea and is vomiting.  She has not been able to keep anything down today.  She is taking the zofran 8 mg TID.   She thinks the Reglan in the hospital helped-she was taking this in  addition to the Zofran.  She is taking lomotil and imodium.  She has intermittent abdominal pain.  She does not feel that she is any better.    Medications and allergies reviewed with patient and updated if appropriate.  Patient Active Problem List   Diagnosis Date Noted  . Protein-calorie malnutrition, severe 01/31/2019  . Hypomagnesemia   . AKI (acute kidney injury) ([place]) 01/30/2019  . Hyponatremia 01/04/2019  . Inflammatory arthritis 01/02/2019  . Positive ANA (antinuclear antibody) 01/02/2019  . Exocrine pancreatic insufficiency 10/23/2018  . Multiple pulmonary nodules determined by computed tomography of lung 09/18/2018  . Non-intractable vomiting   . Decreased GFR 08/21/2018  . Nausea vomiting and diarrhea 08/20/2018  . Syncope 08/01/2018  . Hypokalemia 08/01/2018  . Abnormal CT scan, pelvis 07/31/2018  . Pneumothorax 03/14/2018  . Lung abnormality 01/11/2018  . Pulmonary embolism ([place]) 12/24/2017  . Lung mass 12/24/2017  . Small vessel disease, cerebrovascular 07/31/2017  . Atypical chest pain 06/14/2017  . Dizziness 06/14/2017  . Hyperuricemia 05/03/2017  . Arthralgia 05/01/2017  . Hair loss 05/01/2017  . Vitamin D deficiency 10/31/2016  . Diarrhea 06/06/2016  . Diastolic dysfunction 88/41/6606  . Vertigo 12/24/2015  . Nonspecific abnormal electrocardiogram (ECG) (EKG) 12/24/2015  . Bilateral carotid artery disease, Mild 05/31/2015  . Thyroid nodule 05/31/2015  . Psoriasis   . Scoliosis   . Diabetes type 2, controlled ([place]) 06/03/2008  . Dyslipidemia 06/03/2008  . CARPAL TUNNEL SYNDROME 06/03/2008  . HTN (hypertension) 06/03/2008  . ALLERGIC  RHINITIS 06/03/2008  . Asthma 06/03/2008  . GERD (gastroesophageal reflux disease) 06/03/2008  . Osteopenia 06/03/2008  . COLONIC POLYPS, HX OF 06/03/2008    Current Outpatient Medications on File Prior to Visit  Medication Sig Dispense Refill  . apixaban (ELIQUIS) 5 MG TABS tablet Take 1 tablet (5 mg total) by mouth  2 (two) times daily. 60 tablet 0  . BENICAR 20 MG tablet Take one tablet daily 30 tablet 5  . clindamycin (CLEOCIN T) 1 % external solution Apply 1 application topically daily.     . clobetasol (TEMOVATE) 0.05 % external solution Apply 1 application topically every morning.     . halobetasol (ULTRAVATE) 0.05 % cream Apply 1 application topically 2 (two) times daily.     . lipase/protease/amylase (CREON) 36000 UNITS CPEP capsule Take 1 capsule (36,000 Units total) by mouth 3 (three) times daily with meals. 180 capsule 0  . loperamide (IMODIUM) 2 MG capsule Take 1 capsule (2 mg total) by mouth 2 (two) times daily. 30 capsule 0  . omeprazole (PRILOSEC) 20 MG capsule Take 20 mg by mouth daily.    [name] test strip USE TEST STRIPS TO CHECK BLOOD SUGAR AT LEAST 3 TIMES DAILY 300 strip 1   No current facility-administered medications on file prior to visit.    Past Medical History:  Diagnosis Date  . ALLERGIC RHINITIS   . ANEMIA-NOS   . Arthritis   . ASTHMA   . Carpal tunnel syndrome   . COLONIC POLYPS, HX OF   . Coronary artery disease    "mild CAD" noted on 12/05/17 in coronary CT scan  . DIABETES MELLITUS, TYPE II    diet controlled  . GERD   . HYPERLIPIDEMIA   . HYPERTENSION   . OSTEOPENIA   . PONV (postoperative nausea and vomiting)   . Psoriasis    severe, began soriatane 01/2012  . Rectal fissure   . Scoliosis     Past Surgical History:  Procedure Laterality Date  . benign rectal growth  2004   removed by Dr. Zella Richer  . BIOPSY  08/09/2018   Procedure: BIOPSY;  Surgeon: Carol Ada, MD;  Location: [facility] ENDOSCOPY;  Service: Endoscopy;;  . BIOPSY  08/22/2018   Procedure: BIOPSY;  Surgeon: Juanita Craver, MD;  Location: [facility] ENDOSCOPY;  Service: Endoscopy;;  . BREAST BIOPSY Right   . BREAST EXCISIONAL BIOPSY Left   . BREAST SURGERY  1988   biopsy  . CESAREAN SECTION    . COLONOSCOPY N/A 08/22/2018   Procedure: COLONOSCOPY;  Surgeon: Juanita Craver, MD;  Location: [facility]  ENDOSCOPY;  Service: Endoscopy;  Laterality: N/A;  . ESOPHAGOGASTRODUODENOSCOPY (EGD) WITH PROPOFOL N/A 08/04/2018   Procedure: ESOPHAGOGASTRODUODENOSCOPY (EGD) WITH PROPOFOL;  Surgeon: Ladene Artist, MD;  Location: [facility];  Service: Gastroenterology;  Laterality: N/A;  . FLEXIBLE SIGMOIDOSCOPY N/A 08/04/2018   Procedure: FLEXIBLE SIGMOIDOSCOPY;  Surgeon: Ladene Artist, MD;  Location: [facility];  Service: Gastroenterology;  Laterality: N/A;  . FLEXIBLE SIGMOIDOSCOPY N/A 08/09/2018   Procedure: FLEXIBLE SIGMOIDOSCOPY;  Surgeon: Carol Ada, MD;  Location: [facility];  Service: Endoscopy;  Laterality: N/A;  . VIDEO BRONCHOSCOPY WITH ENDOBRONCHIAL NAVIGATION N/A 03/14/2018   Procedure: VIDEO BRONCHOSCOPY WITH ENDOBRONCHIAL NAVIGATION;  Surgeon: Collene Gobble, MD;  Location: [facility] OR;  Service: Thoracic;  Laterality: N/A;    Social History   Socioeconomic History  . Marital status: Single    Spouse name: Not on file  . Number of children: 1  . Years of  education: 16  . Highest education level: Bachelor's degree (e.g., BA, AB, BS)  Occupational History  . Occupation: retired Careers adviser  Tobacco Use  . Smoking status: Former Smoker    Packs/day: 1.00    Years: 10.00    Pack years: 10.00    Quit date: 02/14/1974    Years since quitting: 45.0  . Smokeless tobacco: Never Used  Substance and Sexual Activity  . Alcohol use: No  . Drug use: No  . Sexual activity: Not on file  Other Topics Concern  . Not on file  Social History Narrative   Lives alone in a one story home.  Has one child and one grandchild.  Retired Careers adviser.  Education: college.    Social Determinants of Health   Financial Resource Strain:   . Difficulty of Paying Living Expenses: Not on file  Food Insecurity:   . Worried About Charity fundraiser in the Last Year: Not on file  . Ran Out of Food in the Last Year: Not on file    Transportation Needs:   . Lack of Transportation (Medical): Not on file  . Lack of Transportation (Non-Medical): Not on file  Physical Activity:   . Days of Exercise per Week: Not on file  . Minutes of Exercise per Session: Not on file  Stress:   . Feeling of Stress : Not on file  Social Connections:   . Frequency of Communication with Friends and Family: Not on file  . Frequency of Social Gatherings with Friends and Family: Not on file  . Attends Religious Services: Not on file  . Active Member of Clubs or Organizations: Not on file  . Attends Archivist Meetings: Not on file  . Marital Status: Not on file    Family History  Problem Relation Age of Onset  . Lung cancer Father   . Arthritis Other        Parents  . Asthma Other        parent, other relative  . Breast cancer Other        other relative  . Hypertension Other        parent, other relative  . Heart disease Other        parent, other relative  . Heart disease Mother   . Asthma Mother   . Breast cancer Maternal Aunt 68  . Parkinson's disease Maternal Grandmother   . Rheumatic fever Maternal Grandfather     Review of Systems  Constitutional: Positive for fatigue.       Generalized weakness  Respiratory: Negative for shortness of breath.   Cardiovascular: Negative for chest pain.  Gastrointestinal: Positive for abdominal pain, diarrhea, nausea and vomiting.  Neurological: Positive for light-headedness. Negative for headaches.       Objective:   Vitals:   02/06/19 1301  BP: (!) 144/86  Pulse: 82  Resp: 14  Temp: 98.2 F (36.8 C)  SpO2: 97%   BP Readings from Last 3 Encounters:  02/06/19 (!) 144/86  02/05/19 (!) 151/75  01/23/19 100/64   Wt Readings from Last 3 Encounters:  02/06/19 115 lb 12.8 oz (52.5 kg)  02/04/19 114 lb 13.8 oz (52.1 kg)  01/23/19 112 lb 9.6 oz (51.1 kg)   Body mass index is 21.53 kg/m.   Physical Exam    Constitutional: Appears well-developed and  well-nourished. No distress.  HENT:  Head: Normocephalic and atraumatic.  Neck: Neck supple. No  tracheal deviation present. No thyromegaly present.  No cervical lymphadenopathy Cardiovascular: Normal rate, regular rhythm and normal heart sounds.   No murmur heard. No carotid bruit .  No edema Pulmonary/Chest: Effort normal and breath sounds normal. No respiratory distress. No has no wheezes. No rales.  Abdomen: soft, NT, distended Skin: Skin is warm and dry. Not diaphoretic.  Psychiatric: Normal mood and affect. Behavior is normal.    US PELVIS (TRANSABDOMINAL ONLY) CLINICAL DATA:  Endometrial thickening seen on CT imaging.  EXAM: TRANSABDOMINAL ULTRASOUND OF PELVIS  TECHNIQUE: Transabdominal ultrasound examination of the pelvis was performed including evaluation of the uterus, ovaries, adnexal regions, and pelvic cul-de-sac.  COMPARISON:  None.  FINDINGS: Uterus  Measurements: 5.6 x 3.0 x 4.5 cm = volume: 39.6 mL. No fibroids or other mass visualized.  Endometrium  Thickness: 9.2 mm. There appears to be fluid in the endometrial canal included in the 9.2 mm thickness measurement.  Right ovary  Not visualized.  Left ovary  Not visualized.  Other findings:  No abnormal free fluid.  IMPRESSION: 1. The endometrial stripe complex was measured at 9.2 mm in thickness. However, I believe this contains fluid within the canal. The endometrium itself does not appear to be particularly thickened. However, evaluation is limited based on transabdominal imaging. Recommend endovaginal imaging for more complete evaluation.  Electronically Signed   By: Dorise Bullion III M.D   On: 02/02/2019 16:41 CT ENTERO ABD/PELVIS W CONTAST CLINICAL DATA:  Diarrhea for months, elevated markers of inflammation, enteritis on CT of July of 2020  EXAM: CT ABDOMEN AND PELVIS WITH CONTRAST (ENTEROGRAPHY)  TECHNIQUE: Multidetector CT of the abdomen and pelvis during bolus administration  of intravenous contrast. Negative oral contrast was given.  CONTRAST:  63mL OMNIPAQUE IOHEXOL 300 MG/ML  SOLN  COMPARISON:  08/21/2018  FINDINGS: Lower chest: Lobular area of branching nodularity in the left lung base measures 2.1 x 1.0 cm. This previously measured 2.4 x 1.5 cm in November of 2019.  Signs of prior inflammation or scarring in the lingula are similar to the recent comparison study.  Hepatobiliary: No signs of focal hepatic lesion. The portal vein is patent in the liver. Gallbladder is normal. No signs of biliary ductal dilation.  Pancreas: Pancreas is largely fatty replaced without signs of focal lesion. No signs of peripancreatic inflammation.  Spleen: Spleen is small but otherwise normal.  Adrenals/Urinary Tract: Adrenal glands with mild thickening bilaterally.  Collapsed cyst arising from the right kidney measuring 2.7 by 1.9 cm shows mildly thickened wall and some internal heterogeneity. This internal heterogeneity appears to be either hemorrhagic or proteinaceous or very subtly calcified and is unchanged since July of 2020.  Stomach/Bowel: Signs of rule fold thickening in the stomach.  Nodular enhancing focus in the second portion of the duodenum measuring 7 mm best seen on early phase, also seen on the delayed phase with less enhancement. No additional hyperenhancing lesion in this area.  Bowel distension in the distal small bowel, filled with negative enteric contrast. The this fluid passes into the colon which is also moderately distended. No site of discrete transition.  Vascular/Lymphatic: Vascular structures in the abdomen are patent. Signs of calcified and noncalcified atherosclerotic plaque of the abdominal aorta. Patent visceral branches. There is no sign of upper abdominal lymphadenopathy. No sign of retroperitoneal adenopathy.  No sign of pelvic lymphadenopathy.  Reproductive: Uterus in situ, with approximately 1 cm thickening of the  endometrium suggested on sagittal view, nonspecific on CT. No adnexal lesion.  Other:  No signs of ascites. No signs of free air.  Musculoskeletal: No acute bone finding or destructive bone process.  IMPRESSION: 1. 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 Endoscopic assessment, perhaps even endoscopic ultrasound with potential for submucosal lesion may be warranted. 2. Signs of gastritis. 3. Signs of bowel distension likely related to ingested contrast with superimposed mild ileus. No distinct create transition. 4. Persistent branching nodularity in the left lung base, slightly decreased in size compared to the recent comparison study. 5. Collapsed cyst arising from the right kidney shows mildly thickened wall and internal heterogeneity. This is likely a collapsed partially hemorrhagic cysts, attention on follow-up. 6. 1 cm thickening of the endometrium, nonspecific on CT. Consider correlation with pelvic sonography if not performed. 7. Signs of branching nodularity in the left lung base diminished in size from more remote studies and non FDG avid. Likely post infectious scarring or mucocele. Attention on follow-up.  Aortic Atherosclerosis (ICD10-I70.0).  Electronically Signed   By: Zetta Bills M.D.   On: 02/02/2019 13:03    Assessment & Plan:    See Problem List for Assessment and Plan of chronic medical problems.    This visit occurred during the SARS-CoV-2 public health emergency.  Safety protocols were in place, including screening questions prior to the visit, additional usage of staff PPE, and extensive cleaning of exam room while observing appropriate contact time as indicated for disinfecting solutions.

## 2019-02-05 NOTE — Progress Notes (Addendum)
Daily Rounding Note  02/05/2019, 8:31 AM  LOS: 6 days   SUBJECTIVE:   Chief complaint: diarrhea, nausea.      Stools still loose/unformed/brown but only 2 yest, none overnight, 1 this AM. Nausea persists though tolerating some po. Pt says only med that helps reflux is Prilosec, other PPI and pepcid has not worked in past  OBJECTIVE:         Vital signs in last 24 hours:    Temp:  [97.9 F (36.6 C)-98.8 F (37.1 C)] 98.6 F (37 C) (12/22 0508) Pulse Rate:  [60-75] 67 (12/22 0508) Resp:  [18-20] 18 (12/22 0508) BP: (127-162)/(73-88) 130/75 (12/22 0508) SpO2:  [95 %-98 %] 98 % (12/22 0508) Weight:  [52.1 kg] 52.1 kg (12/21 2100) Last BM Date: 02/04/19(Per pt) Filed Weights   01/30/19 1635 01/31/19 0300 02/04/19 2100  Weight: 51 kg 49.6 kg 52.1 kg   General: anxious.  Looks well.   Heart: RRR Chest: clear Abdomen: soft, NT, ND, active BS  Extremities: no CCE Neuro/Psych:  Anxious.   Oriented x 3, no weakness, tremors or deficits.    Intake/Output from previous day: 12/21 0701 - 12/22 0700 In: 668.8 [P.O.:220; I.V.:418.8] Out: 950 [Urine:950]  Intake/Output this shift: No intake/output data recorded.  Lab Results: Recent Labs    02/03/19 0726 02/04/19 0345  WBC 7.3 7.1  HGB 9.6* 9.2*  HCT 29.1* 28.4*  PLT 218 204   BMET Recent Labs    02/03/19 0726 02/04/19 0345  NA 138 141  K 3.9 3.8  CL 107 108  CO2 22 25  GLUCOSE 88 85  BUN 8 6*  CREATININE 0.99 0.93  CALCIUM 7.9* 7.8*   LFT Recent Labs    02/04/19 0345  PROT 4.7*  ALBUMIN 2.1*  AST 17  ALT 15  ALKPHOS 63  BILITOT 0.7   PT/INR No results for input(s): LABPROT, INR in the last 72 hours. Hepatitis Panel No results for input(s): HEPBSAG, HCVAB, HEPAIGM, HEPBIGM in the last 72 hours.  Studies/Results: No results found.   Scheduled Meds: . diphenoxylate-atropine  1 tablet Oral TID  . enoxaparin (LOVENOX) injection  50 mg  Subcutaneous Q12H  . feeding supplement  1 Container Oral TID BM  . hydrALAZINE  10 mg Oral Q8H  . lipase/protease/amylase  36,000 Units Oral TID WC  . loperamide  2 mg Oral QID  . loratadine  10 mg Oral Daily  . multivitamin with minerals  1 tablet Oral Daily  . pantoprazole  40 mg Oral Daily   Continuous Infusions: PRN Meds:.ondansetron (ZOFRAN) IV   ASSESMENT:   *   Chronic diarrhea.  CTE with duodenal lesion susp for neuroendocrine tumor.    Elevated fecal lactoferrin.  Below normal pancreatic elastase.  Chromogranin A elevated but this can be found in setting of PPI therapy.   Scheduled imodium, Lomotil, creon in place.    *   AKI resolved.    *   Normocytic anemia. Chronic.  Decline from initial levels in setting of correction of dehydration.     *   Anxiety.    *   Chronic xarelto for hx PE.  Currently on Lovenox.  Last Eliquis 12/20.   *   Psoriasis.       PLAN   *   Pt remains Dr Liliane Channel pt.  She will keep appt with him set for 12/30  Dr Rush Landmark will attempt EUS in Jan, date TBA.  Will need off Eliquis for 2 d beforehand.    *   Ok to restart Eliquis.  Will not be stopping PPI as pt wedded to her Omeprazole.   Start scheduled Zofran 4mg  q 6.   Reluctant to start anxiolytic but would be curious to see if benzo might be helpful for nausea.    *  Leave HH diet in place.           Azucena Freed  02/05/2019, 8:31 AM Phone [phone]

## 2019-02-05 NOTE — Patient Instructions (Addendum)
  Tests ordered today. Your results will be released to [place] (or called to you) after review.  If any changes need to be made, you will be notified at that same time.    Medications reviewed and updated.  Changes include : reglan three times a day for nausea, zofran as needed.     Your prescription(s) have been submitted to your pharmacy. Please take as directed and contact our office if you believe you are having problem(s) with the medication(s).

## 2019-02-05 NOTE — Discharge Summary (Addendum)
[name], is a 73 y.o. female  DOB 06-Nov-1945  MRN [id number].  Admission date:  01/30/2019  Admitting Physician  Bonnell Public, MD  Discharge Date:  02/05/2019   Primary MD  [name], MD  Recommendations for primary care physician for things to follow:  -Please check CBC, CMP during next visit -Patient to keep her appointment with newly established GI as an outpatient Dr. Earlean Shawl. [facility] will arrange for enteroscopy/EUS with possible EMR in January.  We will need to hold her Eliquis 2 days before procedure -Patient to follow with oncology as an outpatient, to arrange for PET scan gallium-68 dotatate study. - Finding of partially collapsed right renal cyst, outpatient follow up.   Admission Diagnosis  Dehydration [E86.0] AKI (acute kidney injury) ([place]) [N17.9] Acute kidney injury ([place]) [N17.9] Diarrhea, unspecified type [R19.7]   Discharge Diagnosis  Dehydration [E86.0] AKI (acute kidney injury) ([place]) [N17.9] Acute kidney injury ([place]) [N17.9] Diarrhea, unspecified type [R19.7]    Active Problems:   Non-intractable vomiting   Exocrine pancreatic insufficiency   AKI (acute kidney injury) ([place])   Protein-calorie malnutrition, severe   Hypomagnesemia      Past Medical History:  Diagnosis Date  . ALLERGIC RHINITIS   . ANEMIA-NOS   . Arthritis   . ASTHMA   . Carpal tunnel syndrome   . COLONIC POLYPS, HX OF   . Coronary artery disease    "mild CAD" noted on 12/05/17 in coronary CT scan  . DIABETES MELLITUS, TYPE II    diet controlled  . GERD   . HYPERLIPIDEMIA   . HYPERTENSION   . OSTEOPENIA   . PONV (postoperative nausea and vomiting)   . Psoriasis    severe, began soriatane 01/2012  . Rectal fissure   . Scoliosis     Past Surgical History:  Procedure Laterality Date  . benign rectal growth  2004   removed by Dr. Zella Richer  . BIOPSY  08/09/2018    Procedure: BIOPSY;  Surgeon: Carol Ada, MD;  Location: [facility] ENDOSCOPY;  Service: Endoscopy;;  . BIOPSY  08/22/2018   Procedure: BIOPSY;  Surgeon: Juanita Craver, MD;  Location: [facility] ENDOSCOPY;  Service: Endoscopy;;  . BREAST BIOPSY Right   . BREAST EXCISIONAL BIOPSY Left   . BREAST SURGERY  1988   biopsy  . CESAREAN SECTION    . COLONOSCOPY N/A 08/22/2018   Procedure: COLONOSCOPY;  Surgeon: Juanita Craver, MD;  Location: [facility] ENDOSCOPY;  Service: Endoscopy;  Laterality: N/A;  . ESOPHAGOGASTRODUODENOSCOPY (EGD) WITH PROPOFOL N/A 08/04/2018   Procedure: ESOPHAGOGASTRODUODENOSCOPY (EGD) WITH PROPOFOL;  Surgeon: Ladene Artist, MD;  Location: [place];  Service: Gastroenterology;  Laterality: N/A;  . FLEXIBLE SIGMOIDOSCOPY N/A 08/04/2018   Procedure: FLEXIBLE SIGMOIDOSCOPY;  Surgeon: Ladene Artist, MD;  Location: [place];  Service: Gastroenterology;  Laterality: N/A;  . FLEXIBLE SIGMOIDOSCOPY N/A 08/09/2018   Procedure: FLEXIBLE SIGMOIDOSCOPY;  Surgeon: Carol Ada, MD;  Location: [place];  Service: Endoscopy;  Laterality: N/A;  . VIDEO BRONCHOSCOPY WITH ENDOBRONCHIAL NAVIGATION N/A  03/14/2018   Procedure: VIDEO BRONCHOSCOPY WITH ENDOBRONCHIAL NAVIGATION;  Surgeon: Collene Gobble, MD;  Location: [facility] OR;  Service: Thoracic;  Laterality: N/A;       History of present illness and  Hospital Course:     Kindly see H&P for history of present illness and admission details, please review complete Labs, Consult reports and Test reports for all details in brief  HPI  from the history and physical done on the day of admission 01/30/2019  HPI:  Patient is a 73 year old lady with past medical history significant for chronic diarrhea, unprovoked pulmonary embolism that was diagnosed in 2019 on Xarelto, hypertension, hyperlipidemia, documented diabetes mellitus and coronary artery disease, colonic polyp and anemia.  Patient started having diarrhea in June of this year.  Essentially, patient has  continued to have diarrhea since June, has been admitted to this hospital 3 times, last admission being in November 2020.  Patient was discharged from [facility] on 01/21/2019 after 3-day stay for worsening diarrhea.  On discharge, diarrhea continued to get worse, as well as nausea and vomiting.  According to the patient, she has not been able to keep anything down.  Patient has had significant weight loss.  No headache, no neck pain, no chest pain, no shortness of breath, no fever, no urinary symptoms.  Patient reports feeling cold all the time.  Significantly, on presentation to the hospital, BUN was 48 with serum creatinine of 4.07 (up from 0.97 on 01/17/2019), with CO2 of 13 and normal anion gap.  There is also gaseous distention of the abdomen, with no associated pain or discomfort.  Hospitalist team has been asked to admit patient for further assessment and management.  ED Course: On presentation to the hospital, she was 97.5, blood pressure 110/57, heart rate of 81, respiratory rate of 18 and O2 sat of 100%.  Work-up done is essentially as above.  Patient be admitted for further assessment and management of acute kidney injury.  Hospital Course   Patient is a 73 year old lady with past medical history significant for chronic diarrhea, unprovoked pulmonary embolism that was diagnosed in 2019 on Xarelto, hypertension, hyperlipidemia, documented diabetes mellitus and coronary artery disease, colonic polyp and anemia. Patient started having diarrhea in June of this year. Essentially, patient has continued to have diarrhea since June, has been admitted to this hospital 3 times, last admission being in November 2020. Patient was discharged from [facility] on 01/21/2019 after 3-day stay for worsening diarrhea. On discharge, diarrhea continued to get worse, as well as nausea and vomiting.  Work-up significant for acute kidney injury secondary to diarrhea, GI were consulted for further evaluation  and recommendation for her chronic diarrhea. CT Enterography  notable for 7 mm lesion in the C-sweep of the duodenum, with appearance highly suspicious for neuroendocrine tumor. EUS unable to be done during hospital stay, to be arranged as an outpatient (Dr. Rush Landmark), she will need to be off Eliquis (x 48 hrs, GFR 48 ml/min) , She would require DOTATE PET scan as an outpatient. (Cannot be done as inpt).  Chronic diarrhea: -Appears to be chronic problem, requiring multiple admissions . -GI input greatly appreciated ,patient chromogranin A was elevated at [facility], suspicion for neuroendocrine tumor given lab findings, she will needPET scan gallium-68 dotatate study. This can only be performedas an outpatient.  Have discussed with Dr. Curt Bears, her outpatient oncologist, he will arrange for outpatient follow-up to help arrange for this test, will she will start following with GI Dr. Earlean Shawl  upon discharge, already having scheduled appointment 02/13/2019. -CT Enterography  notable for 7 mm lesion in the C-sweep of the duodenum, with appearance highly suspicious for neuroendocrine tumor, she was seen by GI, she will need EUS for evaluation and possible consideration of EMR, [facility]  GI will arrange as an outpatient. -Her diarrhea significantly improved, she will be discharged on Lomotil, Imodium.  Acute kidney injury -Baseline creatinine less than 1, significantly elevated at 4.07 on admission.  This has resolved, back to baseline -This is most likely in the setting of this is in the setting of prerenal acidemia, and possibly ATN given episode of hypotension on presentation and clinical dehydration.  Nausea and vomiting: -Significantly improved, minimal, she will be discharged on as needed Zofran  History of unprovoked pulmonary embolism in 2019: -According to the patient, hematology team has advised continuing Xarelto. -Due to severe AKI, he was initially on heparin gtt., now renal  function has improved she can go back on DOAC, given her recurrent admission, with nausea and unreliable oral intake, Xarelto is less ideal option as it has to be taken with food, she was transitioned to Eliquis  Hypertension: -Patient was hypotensive on presentation, with documented blood pressure 74/56 mmHg -Blood pressure started to increase, resume Benicar  severe malnutrition -in the context of chronic illness, and continue with supplement  Renal cyst -Finding of partially collapsed right renal cyst, outpatient follow up.  Discharge Condition:  stable   Follow UP  Follow-up Information    [name], MD Follow up in 1 week(s).   Specialty: Internal Medicine Contact information: [place] [address] [phone]        Richmond Campbell, MD Follow up.   Specialty: Gastroenterology Why: keep your appointment 12/30 Contact information: [place] [address] [phone]             Discharge Instructions  and  Discharge Medications     Discharge Instructions    Discharge instructions   Complete by: As directed    Follow with Primary MD [name], MD in 7 days   Get CBC, CMP,  checked  by Primary MD next visit.    Activity: As tolerated with Full fall precautions use walker/cane & assistance as needed   Disposition Home   Diet: Heart Healthy     On your next visit with your primary care physician please Get Medicines reviewed and adjusted.   Please request your Prim.MD to go over all Hospital Tests and Procedure/Radiological results at the follow up, please get all Hospital records sent to your Prim MD by signing hospital release before you go home.   If you experience worsening of your admission symptoms, develop shortness of breath, life threatening emergency, suicidal or homicidal thoughts you must seek medical attention immediately by calling 911 or calling your MD immediately  if symptoms less  severe.  You Must read complete instructions/literature along with all the possible adverse reactions/side effects for all the Medicines you take and that have been prescribed to you. Take any new Medicines after you have completely understood and accpet all the possible adverse reactions/side effects.   Do not drive, operating heavy machinery, perform activities at heights, swimming or participation in water activities or provide baby sitting services if your were admitted for syncope or siezures until you have seen by Primary MD or a Neurologist and advised to do so again.  Do not drive when taking Pain medications.    Do  not take more than prescribed Pain, Sleep and Anxiety Medications  Special Instructions: If you have smoked or chewed Tobacco  in the last 2 yrs please stop smoking, stop any regular Alcohol  and or any Recreational drug use.  Wear Seat belts while driving.   Please note  You were cared for by a hospitalist during your hospital stay. If you have any questions about your discharge medications or the care you received while you were in the hospital after you are discharged, you can call the unit and asked to speak with the hospitalist on call if the hospitalist that took care of you is not available. Once you are discharged, your primary care physician will handle any further medical issues. Please note that NO REFILLS for any discharge medications will be authorized once you are discharged, as it is imperative that you return to your primary care physician (or establish a relationship with a primary care physician if you do not have one) for your aftercare needs so that they can reassess your need for medications and monitor your lab values.   Increase activity slowly   Complete by: As directed      Allergies as of 02/05/2019      Reactions   Phenergan [promethazine Hcl] Shortness Of Breath, Anxiety   Amlodipine Swelling   Clarithromycin Nausea Only   I can pass out     Codeine Nausea Only   I can pass out    Erythromycin    Other reaction(s): Vomiting (intolerance)   Hydralazine Hcl    Drug-induced lupus - pt wishes to avoid   Statins    Muscle pain    Tetracycline Other (See Comments)   unknown      Medication List    STOP taking these medications   Xarelto 15 MG Tabs tablet Generic drug: Rivaroxaban     TAKE these medications   apixaban 5 MG Tabs tablet Commonly known as: ELIQUIS Take 1 tablet (5 mg total) by mouth 2 (two) times daily.   Benicar 20 MG tablet Generic drug: olmesartan Take one tablet daily   clindamycin 1 % external solution Commonly known as: CLEOCIN T Apply 1 application topically daily.   clobetasol 0.05 % external solution Commonly known as: TEMOVATE Apply 1 application topically every morning.   diphenoxylate-atropine 2.5-0.025 MG tablet Commonly known as: LOMOTIL Take 1 tablet by mouth 3 (three) times daily.   halobetasol 0.05 % cream Commonly known as: ULTRAVATE Apply 1 application topically 2 (two) times daily.   lipase/protease/amylase 36000 UNITS Cpep capsule Commonly known as: CREON Take 1 capsule (36,000 Units total) by mouth 3 (three) times daily with meals.   omeprazole 20 MG capsule Commonly known as: PRILOSEC Take 20 mg by mouth daily.   ondansetron 8 MG disintegrating tablet Commonly known as: Zofran ODT Take 1 tablet (8 mg total) by mouth every 8 (eight) hours as needed for nausea or vomiting.   OneTouch Ultra test strip Generic drug: glucose blood USE TEST STRIPS TO CHECK BLOOD SUGAR AT LEAST 3 TIMES DAILY         Diet and Activity recommendation: See Discharge Instructions above   Consults obtained -  GI   Major procedures and Radiology Reports - PLEASE review detailed and final reports for all details, in brief -      US PELVIS (TRANSABDOMINAL ONLY)  Result Date: 02/02/2019 CLINICAL DATA:  Endometrial thickening seen on CT imaging. EXAM: TRANSABDOMINAL ULTRASOUND  OF PELVIS TECHNIQUE: Transabdominal ultrasound examination of the  pelvis was performed including evaluation of the uterus, ovaries, adnexal regions, and pelvic cul-de-sac. COMPARISON:  None. FINDINGS: Uterus Measurements: 5.6 x 3.0 x 4.5 cm = volume: 39.6 mL. No fibroids or other mass visualized. Endometrium Thickness: 9.2 mm. There appears to be fluid in the endometrial canal included in the 9.2 mm thickness measurement. Right ovary Not visualized. Left ovary Not visualized. Other findings:  No abnormal free fluid. IMPRESSION: 1. The endometrial stripe complex was measured at 9.2 mm in thickness. However, I believe this contains fluid within the canal. The endometrium itself does not appear to be particularly thickened. However, evaluation is limited based on transabdominal imaging. Recommend endovaginal imaging for more complete evaluation. Electronically Signed   By: Dorise Bullion III M.D   On: 02/02/2019 16:41   US Renal  Result Date: 01/31/2019 CLINICAL DATA:  Acute kidney injury. EXAM: RENAL / URINARY TRACT ULTRASOUND COMPLETE COMPARISON:  CT 08/21/2018 FINDINGS: Right Kidney: Renal measurements: 9.0 x 4.4 x 3.7 cm = volume: 77 mL. Echogenicity within normal limits. Cyst in the lateral mid kidney measures 2.2 x 1.8 x 1.9 cm. This is unchanged in size from prior exam. No solid mass or hydronephrosis visualized. Left Kidney: Renal measurements: 9.6 x 4.1 x 3.0 cm = volume: 63 mL. Echogenicity within normal limits. No mass or hydronephrosis visualized. Bladder: Appears normal for degree of bladder distention. Other: None. IMPRESSION: 1. No hydronephrosis or obstructive uropathy. 2. Right renal cyst. Electronically Signed   By: Keith Rake M.D.   On: 01/31/2019 00:18   CT ENTERO ABD/PELVIS W CONTAST  Result Date: 02/02/2019 CLINICAL DATA:  Diarrhea for months, elevated markers of inflammation, enteritis on CT of July of 2020 EXAM: CT ABDOMEN AND PELVIS WITH CONTRAST (ENTEROGRAPHY) TECHNIQUE:  Multidetector CT of the abdomen and pelvis during bolus administration of intravenous contrast. Negative oral contrast was given. CONTRAST:  36mL OMNIPAQUE IOHEXOL 300 MG/ML  SOLN COMPARISON:  08/21/2018 FINDINGS: Lower chest: Lobular area of branching nodularity in the left lung base measures 2.1 x 1.0 cm. This previously measured 2.4 x 1.5 cm in November of 2019. Signs of prior inflammation or scarring in the lingula are similar to the recent comparison study. Hepatobiliary: No signs of focal hepatic lesion. The portal vein is patent in the liver. Gallbladder is normal. No signs of biliary ductal dilation. Pancreas: Pancreas is largely fatty replaced without signs of focal lesion. No signs of peripancreatic inflammation. Spleen: Spleen is small but otherwise normal. Adrenals/Urinary Tract: Adrenal glands with mild thickening bilaterally. Collapsed cyst arising from the right kidney measuring 2.7 by 1.9 cm shows mildly thickened wall and some internal heterogeneity. This internal heterogeneity appears to be either hemorrhagic or proteinaceous or very subtly calcified and is unchanged since July of 2020. Stomach/Bowel: Signs of rule fold thickening in the stomach. Nodular enhancing focus in the second portion of the duodenum measuring 7 mm best seen on early phase, also seen on the delayed phase with less enhancement. No additional hyperenhancing lesion in this area. Bowel distension in the distal small bowel, filled with negative enteric contrast. The this fluid passes into the colon which is also moderately distended. No site of discrete transition. Vascular/Lymphatic: Vascular structures in the abdomen are patent. Signs of calcified and noncalcified atherosclerotic plaque of the abdominal aorta. Patent visceral branches. There is no sign of upper abdominal lymphadenopathy. No sign of retroperitoneal adenopathy. No sign of pelvic lymphadenopathy. Reproductive: Uterus in situ, with approximately 1 cm thickening of  the endometrium suggested on sagittal view,  nonspecific on CT. No adnexal lesion. Other: No signs of ascites. No signs of free air. Musculoskeletal: No acute bone finding or destructive bone process. IMPRESSION: 1. 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 Endoscopic assessment, perhaps even endoscopic ultrasound with potential for submucosal lesion may be warranted. 2. Signs of gastritis. 3. Signs of bowel distension likely related to ingested contrast with superimposed mild ileus. No distinct create transition. 4. Persistent branching nodularity in the left lung base, slightly decreased in size compared to the recent comparison study. 5. Collapsed cyst arising from the right kidney shows mildly thickened wall and internal heterogeneity. This is likely a collapsed partially hemorrhagic cysts, attention on follow-up. 6. 1 cm thickening of the endometrium, nonspecific on CT. Consider correlation with pelvic sonography if not performed. 7. Signs of branching nodularity in the left lung base diminished in size from more remote studies and non FDG avid. Likely post infectious scarring or mucocele. Attention on follow-up. Aortic Atherosclerosis (ICD10-I70.0). Electronically Signed   By: Zetta Bills M.D.   On: 02/02/2019 13:03   DG Abd 2 Views  Result Date: 01/31/2019 CLINICAL DATA:  Diarrhea and nausea. EXAM: ABDOMEN - 2 VIEW COMPARISON:  CT 08/21/2018 FINDINGS: No bowel dilatation to suggest obstruction. No free intra-abdominal air. No radiopaque calculi or abnormal soft tissue calcifications. Lung bases are clear. Scoliotic curvature of the spine with multilevel degenerative change. IMPRESSION: Nonobstructive bowel gas pattern.  No free air. Electronically Signed   By: Keith Rake M.D.   On: 01/31/2019 00:04    Micro Results     Recent Results (from the past 240 hour(s))  SARS CORONAVIRUS 2  (TAT 6-24 HRS) Nasopharyngeal Nasopharyngeal Swab     Status: None   Collection Time: 01/30/19  9:28 PM   Specimen: Nasopharyngeal Swab  Result Value Ref Range Status   SARS Coronavirus 2 NEGATIVE NEGATIVE Final    Comment: (NOTE) SARS-CoV-2 target nucleic acids are NOT DETECTED. The SARS-CoV-2 RNA is generally detectable in upper and lower respiratory specimens during the acute phase of infection. Negative results do not preclude SARS-CoV-2 infection, do not rule out co-infections with other pathogens, and should not be used as the sole basis for treatment or other patient management decisions. Negative results must be combined with clinical observations, patient history, and epidemiological information. The expected result is Negative. Fact Sheet for Patients: SugarRoll.be Fact Sheet for Healthcare Providers: https://www.woods-mathews.com/ This test is not yet approved or cleared by the [place] FDA and  has been authorized for detection and/or diagnosis of SARS-CoV-2 by FDA under an Emergency Use Authorization (EUA). This EUA will remain  in effect (meaning this test can be used) for the duration of the COVID-19 declaration under Section 56 4(b)(1) of the Act, 21 U.S.C. section 360bbb-3(b)(1), unless the authorization is terminated or revoked sooner. Performed at [facility], [place] [address] [place], [place] [address]   OVA + PARASITE EXAM     Status: None   Collection Time: 01/31/19  7:24 AM   Specimen: Stool  Result Value Ref Range Status   OVA + PARASITE EXAM Final report  Final    Comment: (NOTE) These results were obtained using wet preparation(s) and trichrome stained smear. This test does not include testing for Cryptosporidium parvum, Cyclospora, or Microsporidia. Performed At: [place], [place] [id number] Caprice Red MD [id number]    Source  of Sample STOOL  Final     Comment: Performed at [facility], [place] [address], [place], [place] [address]  GI pathogen panel by PCR, stool     Status: None   Collection Time: 01/31/19  7:24 AM   Specimen: Stool  Result Value Ref Range Status   Plesiomonas shigelloides NOT DETECTED NOT DETECTED Final   Yersinia enterocolitica NOT DETECTED NOT DETECTED Final   Vibrio NOT DETECTED NOT DETECTED Final   Enteropathogenic E coli NOT DETECTED NOT DETECTED Final   E coli (ETEC) LT/ST NOT DETECTED NOT DETECTED Final   E coli 0092 by PCR Not applicable NOT DETECTED Final   Cryptosporidium by PCR NOT DETECTED NOT DETECTED Final   Entamoeba histolytica NOT DETECTED NOT DETECTED Final   Adenovirus F 40/41 NOT DETECTED NOT DETECTED Final   Norovirus GI/GII NOT DETECTED NOT DETECTED Final   Sapovirus NOT DETECTED NOT DETECTED Final    Comment: (NOTE) Performed At: [facility] [place], [place] [id number] Rush Farmer MD JF:[id number]    Vibrio cholerae NOT DETECTED NOT DETECTED Final   Campylobacter by PCR NOT DETECTED NOT DETECTED Final   Salmonella by PCR NOT DETECTED NOT DETECTED Final   E coli (STEC) NOT DETECTED NOT DETECTED Final   Enteroaggregative E coli NOT DETECTED NOT DETECTED Final   Shigella by PCR NOT DETECTED NOT DETECTED Final   Cyclospora cayetanensis NOT DETECTED NOT DETECTED Final   Astrovirus NOT DETECTED NOT DETECTED Final   G lamblia by PCR NOT DETECTED NOT DETECTED Final   Rotavirus A by PCR NOT DETECTED NOT DETECTED Final       Today   Subjective:   [name] today nausea is minimal, diarrhea significantly improved, only had 2 episodes yesterday, started to become more formed .  Objective:   Blood pressure (!) 151/75, pulse 67, temperature 97.6 F (36.4 C), temperature source Oral, resp. rate 18, height 5' 1.5" (1.562 m), weight 52.1 kg, SpO2 97 %.   Intake/Output Summary (Last 24 hours) at 02/05/2019 1131 Last data filed at 02/05/2019 0602 Gross  per 24 hour  Intake 269.97 ml  Output 650 ml  Net -380.03 ml    Exam Awake Alert, Oriented x 3, No new F.N deficits, Normal affect Symmetrical Chest wall movement, Good air movement bilaterally, CTAB RRR,No Gallops,Rubs or new Murmurs, No Parasternal Heave +ve B.Sounds, Abd Soft, Non tender. No Cyanosis, Clubbing or edema, No new Rash or bruise  Data Review   CBC w Diff:  Lab Results  Component Value Date   WBC 7.1 02/04/2019   HGB 9.2 (L) 02/04/2019   HGB 12.7 02/22/2018   HCT 28.4 (L) 02/04/2019   HCT 37.8 02/22/2018   PLT 204 02/04/2019   PLT 330 02/22/2018   LYMPHOPCT 14 01/30/2019   MONOPCT 6 01/30/2019   EOSPCT 0 01/30/2019   BASOPCT 0 01/30/2019    CMP:  Lab Results  Component Value Date   NA 141 02/04/2019   NA 141 09/03/2018   K 3.8 02/04/2019   CL 108 02/04/2019   CO2 25 02/04/2019   BUN 6 (L) 02/04/2019   BUN 8 09/03/2018   CREATININE 0.93 02/04/2019   CREATININE 0.96 12/19/2017   PROT 4.7 (L) 02/04/2019   PROT 6.6 09/03/2018   ALBUMIN 2.1 (L) 02/04/2019   ALBUMIN 3.8 09/03/2018   BILITOT 0.7 02/04/2019   BILITOT 0.4 09/03/2018   BILITOT 0.5 12/19/2017   ALKPHOS 63 02/04/2019   AST 17 02/04/2019  AST 18 12/19/2017   ALT 15 02/04/2019   ALT 23 12/19/2017  .   Total Time in preparing paper work, data evaluation and todays exam - 42 minutes  Phillips Climes M.D on 02/05/2019 at 11:31 AM  [facility]   Office  [phone]

## 2019-02-06 ENCOUNTER — Encounter: Payer: Self-pay | Admitting: Internal Medicine

## 2019-02-06 ENCOUNTER — Other Ambulatory Visit: Payer: Self-pay

## 2019-02-06 ENCOUNTER — Ambulatory Visit (INDEPENDENT_AMBULATORY_CARE_PROVIDER_SITE_OTHER): Payer: Medicare Other | Admitting: Internal Medicine

## 2019-02-06 VITALS — BP 144/86 | HR 82 | Temp 98.2°F | Resp 14 | Ht 61.5 in | Wt 115.8 lb

## 2019-02-06 DIAGNOSIS — R112 Nausea with vomiting, unspecified: Secondary | ICD-10-CM | POA: Diagnosis not present

## 2019-02-06 DIAGNOSIS — R197 Diarrhea, unspecified: Secondary | ICD-10-CM

## 2019-02-06 DIAGNOSIS — I10 Essential (primary) hypertension: Secondary | ICD-10-CM | POA: Diagnosis not present

## 2019-02-06 DIAGNOSIS — N179 Acute kidney failure, unspecified: Secondary | ICD-10-CM

## 2019-02-06 LAB — CBC WITH DIFFERENTIAL/PLATELET
Basophils Absolute: 0 10*3/uL (ref 0.0–0.1)
Basophils Relative: 0.5 % (ref 0.0–3.0)
Eosinophils Absolute: 0.1 10*3/uL (ref 0.0–0.7)
Eosinophils Relative: 1.3 % (ref 0.0–5.0)
HCT: 31.7 % — ABNORMAL LOW (ref 36.0–46.0)
Hemoglobin: 10.2 g/dL — ABNORMAL LOW (ref 12.0–15.0)
Lymphocytes Relative: 21.8 % (ref 12.0–46.0)
Lymphs Abs: 0.9 10*3/uL (ref 0.7–4.0)
MCHC: 32.3 g/dL (ref 30.0–36.0)
MCV: 92.9 fl (ref 78.0–100.0)
Monocytes Absolute: 0.3 10*3/uL (ref 0.1–1.0)
Monocytes Relative: 7.8 % (ref 3.0–12.0)
Neutro Abs: 2.9 10*3/uL (ref 1.4–7.7)
Neutrophils Relative %: 68.6 % (ref 43.0–77.0)
Platelets: 250 10*3/uL (ref 150.0–400.0)
RBC: 3.41 Mil/uL — ABNORMAL LOW (ref 3.87–5.11)
RDW: 14.2 % (ref 11.5–15.5)
WBC: 4.3 10*3/uL (ref 4.0–10.5)

## 2019-02-06 LAB — COMPREHENSIVE METABOLIC PANEL
ALT: 14 U/L (ref 0–35)
AST: 15 U/L (ref 0–37)
Albumin: 2.9 g/dL — ABNORMAL LOW (ref 3.5–5.2)
Alkaline Phosphatase: 78 U/L (ref 39–117)
BUN: 12 mg/dL (ref 6–23)
CO2: 25 mEq/L (ref 19–32)
Calcium: 7.8 mg/dL — ABNORMAL LOW (ref 8.4–10.5)
Chloride: 103 mEq/L (ref 96–112)
Creatinine, Ser: 1.04 mg/dL (ref 0.40–1.20)
GFR: 51.62 mL/min — ABNORMAL LOW (ref >60.00)
Glucose, Bld: 80 mg/dL (ref 70–99)
Potassium: 4.2 mEq/L (ref 3.5–5.1)
Sodium: 138 mEq/L (ref 135–145)
Total Bilirubin: 0.5 mg/dL (ref 0.2–1.2)
Total Protein: 5.4 g/dL — ABNORMAL LOW (ref 6.0–8.3)

## 2019-02-06 MED ORDER — DIPHENOXYLATE-ATROPINE 2.5-0.025 MG PO TABS: 1.0000 | ORAL_TABLET | Freq: Three times a day (TID) | ORAL | 1 refills | Status: DC

## 2019-02-06 MED ORDER — ONDANSETRON 8 MG PO TBDP: 8.0000 mg | ORAL_TABLET | Freq: Three times a day (TID) | ORAL | 0 refills | Status: DC | PRN

## 2019-02-06 MED ORDER — METOCLOPRAMIDE HCL 5 MG PO TABS: 5.0000 mg | ORAL_TABLET | Freq: Three times a day (TID) | ORAL | 1 refills | Status: DC

## 2019-02-06 NOTE — Assessment & Plan Note (Signed)
Improved during hospital course High risk of dehydration because she is not able to keep any fluids down Recheck CBC, CMP

## 2019-02-06 NOTE — Assessment & Plan Note (Signed)
Blood pressure okay here today Discussed ideally she needs to monitor this because when she becomes dehydrated her blood pressure drops and at that time she should stop the Benicar Continue Benicar for now CMP

## 2019-02-06 NOTE — Assessment & Plan Note (Signed)
Continues to have constant nausea, vomiting and not keeping food down.  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 Earlean Shawl on 12/30 Will have EUS with lower GI Sees oncology 1/4-he will arrange PET scan She is trying her best to keep food and fluids down

## 2019-02-09 ENCOUNTER — Encounter: Payer: Self-pay | Admitting: Internal Medicine

## 2019-02-11 ENCOUNTER — Telehealth: Payer: Self-pay | Admitting: Cardiology

## 2019-02-11 NOTE — Telephone Encounter (Signed)
Patient was recently discharged from the hospital (12/22) and she states during that time her blood thinner medication was switched multiple times and was eventually released with a prescription for Elliquis. She would like to know if Dr. [name] is okay with her being on Elliquis instead of Xarelto. Patient states that they changed her to Elliquis because she is not eating well due to nausea and vomiting so was having trouble taking Xarelto with a meal.  Patient also states that she has burst a blood vessel in her left eye, probably due to the excessive vomiting. She states that it is not causing her any trouble with her vision and she is not in any pain. Advised her that it should go away within a week or two but to see an eye doctor if it worsens.  Patient also states that she is having some swelling in her feet. She was recently prescribed Lasix prn by Dr. [name], however her other providers advised her not to take this because it causes her to become even more dehydrated. I advised her to keep her feet elevated and move around as much as she can. She states she also has compression socks that she will use. She has not had any weight gain or increased SOB. She states she has been drinking well - 6 8oz glasses per day.

## 2019-02-11 NOTE — Progress Notes (Signed)
The pt has been advised and given telehealth appt for 1/12 at 930 am

## 2019-02-11 NOTE — Telephone Encounter (Signed)
Informed the pt that per Dr. [name], she agrees with Eliquis plan, and she recommends that she continue to hold lasix, and call us if she notices new lower extremity edema or new SOB.  Pt verbalized understanding and agrees with this plan.

## 2019-02-11 NOTE — Telephone Encounter (Signed)
Yes I agree with Eliquis, I would continue holding furosemide, please have her call us if she has new lower extremity edema or new shortness of breath.

## 2019-02-11 NOTE — Telephone Encounter (Signed)
New Message  Pt c/o medication issue:  1. Name of Medication: apixaban (ELIQUIS) 5 MG TABS tablet  2. How are you currently taking this medication (dosage and times per day)? Take 1 tablet (5 mg total) by mouth 2 (two) times daily.  3. Are you having a reaction (difficulty breathing--STAT)? No  4. What is your medication issue? Patient states that since being hospitalized her medication was changed from Xarelto to Eliquis. Patient is reporting swollen feet and a bursted blood vessel in her left eye. 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 Patient wants to discuss what she needs to do about everything. Please give patient a call back to discuss.

## 2019-02-14 ENCOUNTER — Other Ambulatory Visit: Payer: Self-pay

## 2019-02-14 DIAGNOSIS — R918 Other nonspecific abnormal finding of lung field: Secondary | ICD-10-CM

## 2019-02-18 ENCOUNTER — Inpatient Hospital Stay: Payer: Medicare Other | Attending: Internal Medicine | Admitting: Internal Medicine

## 2019-02-18 ENCOUNTER — Other Ambulatory Visit: Payer: Self-pay

## 2019-02-18 ENCOUNTER — Inpatient Hospital Stay: Payer: Medicare Other

## 2019-02-18 ENCOUNTER — Encounter: Payer: Self-pay | Admitting: Internal Medicine

## 2019-02-18 VITALS — BP 151/75 | HR 73 | Temp 98.2°F | Resp 18 | Ht 61.0 in | Wt 110.9 lb

## 2019-02-18 DIAGNOSIS — R918 Other nonspecific abnormal finding of lung field: Secondary | ICD-10-CM

## 2019-02-18 DIAGNOSIS — Z7901 Long term (current) use of anticoagulants: Secondary | ICD-10-CM | POA: Diagnosis not present

## 2019-02-18 DIAGNOSIS — I2782 Chronic pulmonary embolism: Secondary | ICD-10-CM | POA: Diagnosis not present

## 2019-02-18 DIAGNOSIS — I2699 Other pulmonary embolism without acute cor pulmonale: Secondary | ICD-10-CM | POA: Insufficient documentation

## 2019-02-18 DIAGNOSIS — E43 Unspecified severe protein-calorie malnutrition: Secondary | ICD-10-CM | POA: Diagnosis not present

## 2019-02-18 DIAGNOSIS — I10 Essential (primary) hypertension: Secondary | ICD-10-CM | POA: Insufficient documentation

## 2019-02-18 LAB — CMP (CANCER CENTER ONLY)
ALT: 30 U/L (ref 0–44)
AST: 33 U/L (ref 15–41)
Albumin: 2.8 g/dL — ABNORMAL LOW (ref 3.5–5.0)
Alkaline Phosphatase: 80 U/L (ref 38–126)
Anion gap: 10 (ref 5–15)
BUN: 8 mg/dL (ref 8–23)
CO2: 27 mmol/L (ref 22–32)
Calcium: 7.8 mg/dL — ABNORMAL LOW (ref 8.9–10.3)
Chloride: 102 mmol/L (ref 98–111)
Creatinine: 0.96 mg/dL (ref 0.44–1.00)
GFR, Est AFR Am: >60 mL/min (ref >60)
GFR, Estimated: 58 mL/min — ABNORMAL LOW (ref >60)
Glucose, Bld: 89 mg/dL (ref 70–99)
Potassium: 3.5 mmol/L (ref 3.5–5.1)
Sodium: 139 mmol/L (ref 135–145)
Total Bilirubin: 0.8 mg/dL (ref 0.3–1.2)
Total Protein: 5.7 g/dL — ABNORMAL LOW (ref 6.5–8.1)

## 2019-02-18 LAB — CBC WITH DIFFERENTIAL (CANCER CENTER ONLY)
Abs Immature Granulocytes: 0.01 10*3/uL (ref 0.00–0.07)
Basophils Absolute: 0 10*3/uL (ref 0.0–0.1)
Basophils Relative: 1 %
Eosinophils Absolute: 0.1 10*3/uL (ref 0.0–0.5)
Eosinophils Relative: 1 %
HCT: 32.7 % — ABNORMAL LOW (ref 36.0–46.0)
Hemoglobin: 10.7 g/dL — ABNORMAL LOW (ref 12.0–15.0)
Immature Granulocytes: 0 %
Lymphocytes Relative: 21 %
Lymphs Abs: 1.5 10*3/uL (ref 0.7–4.0)
MCH: 29.8 pg (ref 26.0–34.0)
MCHC: 32.7 g/dL (ref 30.0–36.0)
MCV: 91.1 fL (ref 80.0–100.0)
Monocytes Absolute: 0.6 10*3/uL (ref 0.1–1.0)
Monocytes Relative: 8 %
Neutro Abs: 4.9 10*3/uL (ref 1.7–7.7)
Neutrophils Relative %: 69 %
Platelet Count: 295 10*3/uL (ref 150–400)
RBC: 3.59 MIL/uL — ABNORMAL LOW (ref 3.87–5.11)
RDW: 15.4 % (ref 11.5–15.5)
WBC Count: 7.1 10*3/uL (ref 4.0–10.5)
nRBC: 0 % (ref 0.0–0.2)

## 2019-02-18 NOTE — Progress Notes (Signed)
Subjective:    Patient ID: [name], female    DOB: 1945-07-30, 74 y.o.   MRN: [id number]  HPI The patient is here for follow up.  Nausea, vomiting, diarrhea, weight loss:  She did see Dr Earlean Shawl.  She is taing zofran regularly and phenergan.  Dr Earlean Shawl ordered a [id number]-[id number] DOTATA and she will have an EGD + EUS/ESD.  She will follow up with Dr Earlean Shawl the end of this month.     No improvement in her nausea or the amount of food that he is able to keep down.  He was able to eat 3 very small meals a day only.  He is trying to drink plenty of fluids, but struggled with that.  She is taking the Zofran.  She has not tried the Phenergan again and is nervous about trying it because of the side effect she had in the hospital.  She continues to feel weak.  Continues to lose weight.  She is frustrated by not having any answers and not been able to start treatment.  She wonders if an iron infusion would help her feel better.  She has not tolerated oral iron in the past.  Feet still swollen.  She is wearing compression socks.  She is compliant with a low-sodium diet.  Her feet are uncomfortable, but tolerable.  Medications and allergies reviewed with patient and updated if appropriate.  Patient Active Problem List   Diagnosis Date Noted  . Protein-calorie malnutrition, severe 01/31/2019  . Hypomagnesemia   . AKI (acute kidney injury) ([place]) 01/30/2019  . Hyponatremia 01/04/2019  . Inflammatory arthritis 01/02/2019  . Positive ANA (antinuclear antibody) 01/02/2019  . Exocrine pancreatic insufficiency 10/23/2018  . Multiple pulmonary nodules determined by computed tomography of lung 09/18/2018  . Decreased GFR 08/21/2018  . Nausea vomiting and diarrhea 08/20/2018  . Syncope 08/01/2018  . Hypokalemia 08/01/2018  . Abnormal CT scan, pelvis 07/31/2018  . Pneumothorax 03/14/2018  . Lung abnormality 01/11/2018  . Pulmonary embolism ([place]) 12/24/2017  . Lung mass 12/24/2017  . Small vessel  disease, cerebrovascular 07/31/2017  . Atypical chest pain 06/14/2017  . Dizziness 06/14/2017  . Hyperuricemia 05/03/2017  . Arthralgia 05/01/2017  . Hair loss 05/01/2017  . Vitamin D deficiency 10/31/2016  . Diarrhea 06/06/2016  . Diastolic dysfunction 81/82/9937  . Vertigo 12/24/2015  . Nonspecific abnormal electrocardiogram (ECG) (EKG) 12/24/2015  . Bilateral carotid artery disease, Mild 05/31/2015  . Thyroid nodule 05/31/2015  . Psoriasis   . Scoliosis   . Diabetes type 2, controlled ([place]) 06/03/2008  . Dyslipidemia 06/03/2008  . CARPAL TUNNEL SYNDROME 06/03/2008  . HTN (hypertension) 06/03/2008  . ALLERGIC RHINITIS 06/03/2008  . Asthma 06/03/2008  . GERD (gastroesophageal reflux disease) 06/03/2008  . Osteopenia 06/03/2008  . COLONIC POLYPS, HX OF 06/03/2008    Current Outpatient Medications on File Prior to Visit  Medication Sig Dispense Refill  . apixaban (ELIQUIS) 5 MG TABS tablet Take 1 tablet (5 mg total) by mouth 2 (two) times daily. 60 tablet 0  . BENICAR 20 MG tablet Take one tablet daily 30 tablet 5  . clindamycin (CLEOCIN T) 1 % external solution Apply 1 application topically daily.     . clobetasol (TEMOVATE) 0.05 % external solution Apply 1 application topically every morning.     Marland Kitchen CREON 12000 units CPEP capsule     . diphenoxylate-atropine (LOMOTIL) 2.5-0.025 MG tablet Take 1 tablet by mouth 3 (three) times daily. 90 tablet 1  . halobetasol (ULTRAVATE) 0.05 %  cream Apply 1 application topically 2 (two) times daily.     Marland Kitchen loperamide (IMODIUM) 2 MG capsule Take 1 capsule (2 mg total) by mouth 2 (two) times daily. 30 capsule 0  . omeprazole (PRILOSEC) 20 MG capsule Take 20 mg by mouth daily.    . ondansetron (ZOFRAN ODT) 8 MG disintegrating tablet Take 1 tablet (8 mg total) by mouth every 8 (eight) hours as needed for nausea or vomiting. 60 tablet 0  . ONETOUCH ULTRA test strip USE TEST STRIPS TO CHECK BLOOD SUGAR AT LEAST 3 TIMES DAILY 300 strip 1   No current  facility-administered medications on file prior to visit.    Past Medical History:  Diagnosis Date  . ALLERGIC RHINITIS   . ANEMIA-NOS   . Arthritis   . ASTHMA   . Carpal tunnel syndrome   . COLONIC POLYPS, HX OF   . Coronary artery disease    "mild CAD" noted on 12/05/17 in coronary CT scan  . DIABETES MELLITUS, TYPE II    diet controlled  . GERD   . HYPERLIPIDEMIA   . HYPERTENSION   . OSTEOPENIA   . PONV (postoperative nausea and vomiting)   . Psoriasis    severe, began soriatane 01/2012  . Rectal fissure   . Scoliosis     Past Surgical History:  Procedure Laterality Date  . benign rectal growth  2004   removed by Dr. Zella Richer  . BIOPSY  08/09/2018   Procedure: BIOPSY;  Surgeon: Carol Ada, MD;  Location: [facility] ENDOSCOPY;  Service: Endoscopy;;  . BIOPSY  08/22/2018   Procedure: BIOPSY;  Surgeon: Juanita Craver, MD;  Location: [facility] ENDOSCOPY;  Service: Endoscopy;;  . BREAST BIOPSY Right   . BREAST EXCISIONAL BIOPSY Left   . BREAST SURGERY  1988   biopsy  . CESAREAN SECTION    . COLONOSCOPY N/A 08/22/2018   Procedure: COLONOSCOPY;  Surgeon: Juanita Craver, MD;  Location: [facility] ENDOSCOPY;  Service: Endoscopy;  Laterality: N/A;  . ESOPHAGOGASTRODUODENOSCOPY (EGD) WITH PROPOFOL N/A 08/04/2018   Procedure: ESOPHAGOGASTRODUODENOSCOPY (EGD) WITH PROPOFOL;  Surgeon: Ladene Artist, MD;  Location: [place];  Service: Gastroenterology;  Laterality: N/A;  . FLEXIBLE SIGMOIDOSCOPY N/A 08/04/2018   Procedure: FLEXIBLE SIGMOIDOSCOPY;  Surgeon: Ladene Artist, MD;  Location: [place];  Service: Gastroenterology;  Laterality: N/A;  . FLEXIBLE SIGMOIDOSCOPY N/A 08/09/2018   Procedure: FLEXIBLE SIGMOIDOSCOPY;  Surgeon: Carol Ada, MD;  Location: [place];  Service: Endoscopy;  Laterality: N/A;  . VIDEO BRONCHOSCOPY WITH ENDOBRONCHIAL NAVIGATION N/A 03/14/2018   Procedure: VIDEO BRONCHOSCOPY WITH ENDOBRONCHIAL NAVIGATION;  Surgeon: Collene Gobble, MD;  Location: [facility] OR;  Service:  Thoracic;  Laterality: N/A;    Social History   Socioeconomic History  . Marital status: Single    Spouse name: Not on file  . Number of children: 1  . Years of education: 37  . Highest education level: Bachelor's degree (e.g., BA, AB, BS)  Occupational History  . Occupation: retired Careers adviser  Tobacco Use  . Smoking status: Former Smoker    Packs/day: 1.00    Years: 10.00    Pack years: 10.00    Quit date: 02/14/1974    Years since quitting: 45.0  . Smokeless tobacco: Never Used  Substance and Sexual Activity  . Alcohol use: No  . Drug use: No  . Sexual activity: Not on file  Other Topics Concern  . Not on file  Social History Narrative   Lives alone in a  one story home.  Has one child and one grandchild.  Retired Careers adviser.  Education: college.    Social Determinants of Health   Financial Resource Strain:   . Difficulty of Paying Living Expenses: Not on file  Food Insecurity:   . Worried About Charity fundraiser in the Last Year: Not on file  . Ran Out of Food in the Last Year: Not on file  Transportation Needs:   . Lack of Transportation (Medical): Not on file  . Lack of Transportation (Non-Medical): Not on file  Physical Activity:   . Days of Exercise per Week: Not on file  . Minutes of Exercise per Session: Not on file  Stress:   . Feeling of Stress : Not on file  Social Connections:   . Frequency of Communication with Friends and Family: Not on file  . Frequency of Social Gatherings with Friends and Family: Not on file  . Attends Religious Services: Not on file  . Active Member of Clubs or Organizations: Not on file  . Attends Archivist Meetings: Not on file  . Marital Status: Not on file    Family History  Problem Relation Age of Onset  . Lung cancer Father   . Arthritis Other        Parents  . Asthma Other        parent, other relative  . Breast cancer Other         other relative  . Hypertension Other        parent, other relative  . Heart disease Other        parent, other relative  . Heart disease Mother   . Asthma Mother   . Breast cancer Maternal Aunt 68  . Parkinson's disease Maternal Grandmother   . Rheumatic fever Maternal Grandfather     Review of Systems  Constitutional: Positive for fatigue. Negative for chills and fever.  Gastrointestinal: Positive for diarrhea and nausea. Negative for abdominal pain and vomiting.       [place]       Objective:   Vitals:   02/19/19 1341  BP: (!) 168/94  Pulse: 71  Resp: 16  Temp: 98.4 F (36.9 C)  SpO2: 99%   BP Readings from Last 3 Encounters:  02/19/19 (!) 168/94  02/18/19 (!) 151/75  02/06/19 (!) 144/86   Wt Readings from Last 3 Encounters:  02/19/19 110 lb 6.4 oz (50.1 kg)  02/18/19 110 lb 14.4 oz (50.3 kg)  02/06/19 115 lb 12.8 oz (52.5 kg)   Body mass index is 20.86 kg/m.   Physical Exam    Constitutional: chronically ill appearing. Pale. No distress.  HENT:  Head: Normocephalic and atraumatic.  Neck: Neck supple. No tracheal deviation present. No thyromegaly present.  No cervical lymphadenopathy Cardiovascular: Normal rate, regular rhythm and normal heart sounds.   No murmur heard.  Mild bilateral lower extremity edema has been wearing compression Pulmonary/Chest: Effort normal and breath sounds normal. No respiratory distress. No has no wheezes. No rales. Abdomen: Distended, soft, minimal diffuse discomfort-no pain and no rebound or guarding Skin: Skin is warm and dry. Not diaphoretic.  Psychiatric: Normal mood and affect. Behavior is normal.      Assessment & Plan:    See Problem List for Assessment and Plan of chronic medical problems.     This visit occurred during the SARS-CoV-2 public health emergency.  Safety protocols were in place, including screening questions prior to the visit,  additional usage of staff PPE, and extensive cleaning of exam room while  observing appropriate contact time as indicated for disinfecting solutions.

## 2019-02-18 NOTE — Progress Notes (Signed)
[facility] [phone]   [phone]  OFFICE PROGRESS NOTE  Caitlin Rail, [name] [place] [address]  DIAGNOSIS:   1) Left lower lobe lung mass, rule out lung cancer. 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  PRIOR THERAPY: None  CURRENT THERAPY: Xarelto 15 mg p.o. twice daily for 3 weeks followed by 20 mg p.o. daily.  INTERVAL HISTORY: [name] 74 y.o. female returns to the clinic today for hospital follow-up visit accompanied by friend.  The patient complaining of increasing fatigue and weakness as well as frequent episodes of nausea and diarrhea as well as weight loss.  During her hospitalization she had CT scan of the abdomen and pelvis that showed 0.7 cm enhancing focus in the duodenum suspicious for a small neuroendocrine tumor based on the hypervascular features.  Endoscopic assessment was recommended.  The patient was seen recently by Dr. Earlean Shawl who is planning for a PET scan as well as chromogranin a level.  The procedure has not been scheduled yet.  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  She was followed by Dr. Lake Bells from pulmonary medicine.  For some reason she was referred back to me today for reevaluation regarding her condition.  She has no chest pain, shortness of breath except with exertion with no cough or hemoptysis.  MEDICAL HISTORY: Past Medical History:  Diagnosis Date  . ALLERGIC RHINITIS   . ANEMIA-NOS   . Arthritis   . ASTHMA   . Carpal tunnel syndrome   . COLONIC POLYPS, HX OF   . Coronary artery disease    "mild CAD" noted on 12/05/17 in coronary CT scan  . DIABETES MELLITUS, TYPE II    diet controlled  . GERD   . HYPERLIPIDEMIA   . HYPERTENSION   . OSTEOPENIA   . PONV (postoperative nausea and  vomiting)   . Psoriasis    severe, began soriatane 01/2012  . Rectal fissure   . Scoliosis     ALLERGIES:  is allergic to phenergan [promethazine hcl]; amlodipine; clarithromycin; codeine; erythromycin; hydralazine hcl; statins; and tetracycline.  MEDICATIONS:  Current Outpatient Medications  Medication Sig Dispense Refill  . apixaban (ELIQUIS) 5 MG TABS tablet Take 1 tablet (5 mg total) by mouth 2 (two) times daily. 60 tablet 0  . BENICAR 20 MG tablet Take one tablet daily 30 tablet 5  . clindamycin (CLEOCIN T) 1 % external solution Apply 1 application topically daily.     . clobetasol (TEMOVATE) 0.05 % external solution Apply 1 application topically every morning.     Marland Kitchen CREON 12000 units CPEP capsule     . diphenoxylate-atropine (LOMOTIL) 2.5-0.025 MG tablet Take 1 tablet by mouth 3 (three) times daily. 90 tablet 1  . halobetasol (ULTRAVATE) 0.05 % cream Apply 1 application topically 2 (two) times daily.     Marland Kitchen loperamide (IMODIUM) 2 MG capsule Take 1 capsule (2 mg total) by mouth 2 (two) times daily. 30 capsule 0  . omeprazole (PRILOSEC) 20 MG capsule Take 20 mg by mouth daily.    . ondansetron (ZOFRAN ODT) 8 MG disintegrating tablet Take 1 tablet (8 mg total) by mouth every 8 (eight) hours as needed for nausea or vomiting. 60 tablet 0  . ONETOUCH ULTRA test strip USE TEST STRIPS TO  CHECK BLOOD SUGAR AT LEAST 3 TIMES DAILY 300 strip 1   No current facility-administered medications for this visit.    SURGICAL HISTORY:  Past Surgical History:  Procedure Laterality Date  . benign rectal growth  2004   removed by Dr. Zella Richer  . BIOPSY  08/09/2018   Procedure: BIOPSY;  Surgeon: Carol Ada, [name];  Location: [facility] ENDOSCOPY;  Service: Endoscopy;;  . BIOPSY  08/22/2018   Procedure: BIOPSY;  Surgeon: Juanita Craver, [name];  Location: [facility] ENDOSCOPY;  Service: Endoscopy;;  . BREAST BIOPSY Right   . BREAST EXCISIONAL BIOPSY Left   . BREAST SURGERY  1988   biopsy  . CESAREAN SECTION    .  COLONOSCOPY N/A 08/22/2018   Procedure: COLONOSCOPY;  Surgeon: Juanita Craver, [name];  Location: [facility] ENDOSCOPY;  Service: Endoscopy;  Laterality: N/A;  . ESOPHAGOGASTRODUODENOSCOPY (EGD) WITH PROPOFOL N/A 08/04/2018   Procedure: ESOPHAGOGASTRODUODENOSCOPY (EGD) WITH PROPOFOL;  Surgeon: Ladene Artist, [name];  Location: [place];  Service: Gastroenterology;  Laterality: N/A;  . FLEXIBLE SIGMOIDOSCOPY N/A 08/04/2018   Procedure: FLEXIBLE SIGMOIDOSCOPY;  Surgeon: Ladene Artist, [name];  Location: [place];  Service: Gastroenterology;  Laterality: N/A;  . FLEXIBLE SIGMOIDOSCOPY N/A 08/09/2018   Procedure: FLEXIBLE SIGMOIDOSCOPY;  Surgeon: Carol Ada, [name];  Location: [place];  Service: Endoscopy;  Laterality: N/A;  . VIDEO BRONCHOSCOPY WITH ENDOBRONCHIAL NAVIGATION N/A 03/14/2018   Procedure: VIDEO BRONCHOSCOPY WITH ENDOBRONCHIAL NAVIGATION;  Surgeon: Collene Gobble, [name];  Location: [facility] OR;  Service: Thoracic;  Laterality: N/A;    REVIEW OF SYSTEMS:  Constitutional: positive for fatigue and weight loss Eyes: negative Ears, nose, mouth, throat, and face: negative Respiratory: negative Cardiovascular: negative Gastrointestinal: positive for diarrhea and nausea Genitourinary:negative Integument/breast: negative Hematologic/lymphatic: negative Musculoskeletal:positive for arthralgias and muscle weakness Neurological: negative Behavioral/Psych: negative Endocrine: negative Allergic/Immunologic: negative   PHYSICAL EXAMINATION: General appearance: alert, cooperative, fatigued and no distress Head: Normocephalic, without obvious abnormality, atraumatic Neck: no adenopathy, no JVD, supple, symmetrical, trachea midline and thyroid not enlarged, symmetric, no tenderness/mass/nodules Lymph nodes: Cervical, supraclavicular, and axillary nodes normal. Resp: clear to auscultation bilaterally Back: symmetric, no curvature. ROM normal. No CVA tenderness. Cardio: regular rate and rhythm, S1, S2 normal, no  murmur, click, rub or gallop GI: soft, non-tender; bowel sounds normal; no masses,  no organomegaly Extremities: extremities normal, atraumatic, no cyanosis or edema Neurologic: Alert and oriented X 3, normal strength and tone. Normal symmetric reflexes. Normal coordination and gait  ECOG PERFORMANCE STATUS: 1 - Symptomatic but completely ambulatory  Blood pressure (!) 151/75, pulse 73, temperature 98.2 F (36.8 C), temperature source Temporal, resp. rate 18, height _0  (1.549 m), weight 110 lb 14.4 oz (50.3 kg), SpO2 99 %.  LABORATORY DATA: Lab Results  Component Value Date   WBC 7.1 02/18/2019   HGB 10.7 (L) 02/18/2019   HCT 32.7 (L) 02/18/2019   MCV 91.1 02/18/2019   PLT 295 02/18/2019      Chemistry      Component Value Date/Time   NA 138 02/06/2019 1343   NA 141 09/03/2018 0824   K 4.2 02/06/2019 1343   CL 103 02/06/2019 1343   CO2 25 02/06/2019 1343   BUN 12 02/06/2019 1343   BUN 8 09/03/2018 0824   CREATININE 1.04 02/06/2019 1343   CREATININE 0.96 12/19/2017 1327      Component Value Date/Time   CALCIUM 7.8 (L) 02/06/2019 1343   ALKPHOS 78 02/06/2019 1343   AST 15 02/06/2019 1343   AST 18 12/19/2017 1327   ALT  14 02/06/2019 1343   ALT 23 12/19/2017 1327   BILITOT 0.5 02/06/2019 1343   BILITOT 0.4 09/03/2018 0824   BILITOT 0.5 12/19/2017 1327       RADIOGRAPHIC STUDIES: US PELVIS (TRANSABDOMINAL ONLY)  Result Date: 02/02/2019 CLINICAL DATA:  Endometrial thickening seen on CT imaging. EXAM: TRANSABDOMINAL ULTRASOUND OF PELVIS TECHNIQUE: Transabdominal ultrasound examination of the pelvis was performed including evaluation of the uterus, ovaries, adnexal regions, and pelvic cul-de-sac. COMPARISON:  None. FINDINGS: Uterus Measurements: 5.6 x 3.0 x 4.5 cm = volume: 39.6 mL. No fibroids or other mass visualized. Endometrium Thickness: 9.2 mm. There appears to be fluid in the endometrial canal included in the 9.2 mm thickness measurement. Right ovary Not  visualized. Left ovary Not visualized. Other findings:  No abnormal free fluid. IMPRESSION: 1. The endometrial stripe complex was measured at 9.2 mm in thickness. However, I believe this contains fluid within the canal. The endometrium itself does not appear to be particularly thickened. However, evaluation is limited based on transabdominal imaging. Recommend endovaginal imaging for more complete evaluation. Electronically Signed   By: Dorise Bullion III M.D   On: 02/02/2019 16:41   US Renal  Result Date: 01/31/2019 CLINICAL DATA:  Acute kidney injury. EXAM: RENAL / URINARY TRACT ULTRASOUND COMPLETE COMPARISON:  CT 08/21/2018 FINDINGS: Right Kidney: Renal measurements: 9.0 x 4.4 x 3.7 cm = volume: 77 mL. Echogenicity within normal limits. Cyst in the lateral mid kidney measures 2.2 x 1.8 x 1.9 cm. This is unchanged in size from prior exam. No solid mass or hydronephrosis visualized. Left Kidney: Renal measurements: 9.6 x 4.1 x 3.0 cm = volume: 63 mL. Echogenicity within normal limits. No mass or hydronephrosis visualized. Bladder: Appears normal for degree of bladder distention. Other: None. IMPRESSION: 1. No hydronephrosis or obstructive uropathy. 2. Right renal cyst. Electronically Signed   By: Keith Rake M.D.   On: 01/31/2019 00:18   CT ENTERO ABD/PELVIS W CONTAST  Result Date: 02/02/2019 CLINICAL DATA:  Diarrhea for months, elevated markers of inflammation, enteritis on CT of July of 2020 EXAM: CT ABDOMEN AND PELVIS WITH CONTRAST (ENTEROGRAPHY) TECHNIQUE: Multidetector CT of the abdomen and pelvis during bolus administration of intravenous contrast. Negative oral contrast was given. CONTRAST:  16m OMNIPAQUE IOHEXOL 300 MG/ML  SOLN COMPARISON:  08/21/2018 FINDINGS: Lower chest: Lobular area of branching nodularity in the left lung base measures 2.1 x 1.0 cm. This previously measured 2.4 x 1.5 cm in November of 2019. Signs of prior inflammation or scarring in the lingula are similar to the  recent comparison study. Hepatobiliary: No signs of focal hepatic lesion. The portal vein is patent in the liver. Gallbladder is normal. No signs of biliary ductal dilation. Pancreas: Pancreas is largely fatty replaced without signs of focal lesion. No signs of peripancreatic inflammation. Spleen: Spleen is small but otherwise normal. Adrenals/Urinary Tract: Adrenal glands with mild thickening bilaterally. Collapsed cyst arising from the right kidney measuring 2.7 by 1.9 cm shows mildly thickened wall and some internal heterogeneity. This internal heterogeneity appears to be either hemorrhagic or proteinaceous or very subtly calcified and is unchanged since July of 2020. Stomach/Bowel: Signs of rule fold thickening in the stomach. Nodular enhancing focus in the second portion of the duodenum measuring 7 mm best seen on early phase, also seen on the delayed phase with less enhancement. No additional hyperenhancing lesion in this area. Bowel distension in the distal small bowel, filled with negative enteric contrast. The this fluid passes into the colon which is also  moderately distended. No site of discrete transition. Vascular/Lymphatic: Vascular structures in the abdomen are patent. Signs of calcified and noncalcified atherosclerotic plaque of the abdominal aorta. Patent visceral branches. There is no sign of upper abdominal lymphadenopathy. No sign of retroperitoneal adenopathy. No sign of pelvic lymphadenopathy. Reproductive: Uterus in situ, with approximately 1 cm thickening of the endometrium suggested on sagittal view, nonspecific on CT. No adnexal lesion. Other: No signs of ascites. No signs of free air. Musculoskeletal: No acute bone finding or destructive bone process. IMPRESSION: 1. 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 Endoscopic assessment, perhaps even endoscopic ultrasound with potential for  submucosal lesion may be warranted. 2. Signs of gastritis. 3. Signs of bowel distension likely related to ingested contrast with superimposed mild ileus. No distinct create transition. 4. Persistent branching nodularity in the left lung base, slightly decreased in size compared to the recent comparison study. 5. Collapsed cyst arising from the right kidney shows mildly thickened wall and internal heterogeneity. This is likely a collapsed partially hemorrhagic cysts, attention on follow-up. 6. 1 cm thickening of the endometrium, nonspecific on CT. Consider correlation with pelvic sonography if not performed. 7. Signs of branching nodularity in the left lung base diminished in size from more remote studies and non FDG avid. Likely post infectious scarring or mucocele. Attention on follow-up. Aortic Atherosclerosis (ICD10-I70.0). Electronically Signed   By: Zetta Bills M.D.   On: 02/02/2019 13:03   DG Abd 2 Views  Result Date: 01/31/2019 CLINICAL DATA:  Diarrhea and nausea. EXAM: ABDOMEN - 2 VIEW COMPARISON:  CT 08/21/2018 FINDINGS: No bowel dilatation to suggest obstruction. No free intra-abdominal air. No radiopaque calculi or abnormal soft tissue calcifications. Lung bases are clear. Scoliotic curvature of the spine with multilevel degenerative change. IMPRESSION: Nonobstructive bowel gas pattern.  No free air. Electronically Signed   By: Keith Rake M.D.   On: 01/31/2019 00:04    ASSESSMENT AND PLAN: This is a very pleasant 73 years old white female who was seen for evaluation of left lower lobe lung mass in addition to multiple pulmonary nodules concerning for lung cancer.  The patient was also incidentally diagnosed with acute pulmonary embolism and currently on treatment with Xarelto. She had imaging studies in the past including CT scan of the chest as well as PET scan for evaluation of pulmonary nodules that felt to be inflammatory in origin with no hypermetabolic activity. She was supposed  to follow with pulmonary and her primary care physician but recent imaging studies showed suspicious neuroendocrine tumor in the duodenum measuring 7 mm. She is currently under evaluation by Dr. Earlean Shawl.  She is expected to have DOTATATE PET scan as well as chromogranin for evaluation of her suspicious neuroendocrine tumor. 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 For the acute pulmonary embolism she will continue her current treatment with Xarelto. For hypertension, she will continue her current treatment and monitor it closely at home. I did not give the patient a follow-up appointment but will be happy to see her if the work-up showed any evidence for malignancy. She was advised to call if she has any other concerning symptoms. The patient voices understanding of current disease status and treatment options and is in agreement with the current care plan.  All questions were answered. The patient  knows to call the clinic with any problems, questions or concerns. We can certainly see the patient much sooner if necessary.  I spent 20 minutes counseling the patient face to face. The total time spent in the appointment was 30 minutes.  Disclaimer: This note was dictated with voice recognition software. Similar sounding words can inadvertently be transcribed and may not be corrected upon review.

## 2019-02-19 ENCOUNTER — Encounter: Payer: Self-pay | Admitting: Internal Medicine

## 2019-02-19 ENCOUNTER — Ambulatory Visit (INDEPENDENT_AMBULATORY_CARE_PROVIDER_SITE_OTHER): Payer: Medicare Other | Admitting: Internal Medicine

## 2019-02-19 VITALS — BP 168/94 | HR 71 | Temp 98.4°F | Resp 16 | Ht 61.0 in | Wt 110.4 lb

## 2019-02-19 DIAGNOSIS — R197 Diarrhea, unspecified: Secondary | ICD-10-CM

## 2019-02-19 DIAGNOSIS — D509 Iron deficiency anemia, unspecified: Secondary | ICD-10-CM

## 2019-02-19 DIAGNOSIS — R112 Nausea with vomiting, unspecified: Secondary | ICD-10-CM | POA: Diagnosis not present

## 2019-02-19 DIAGNOSIS — Z862 Personal history of diseases of the blood and blood-forming organs and certain disorders involving the immune mechanism: Secondary | ICD-10-CM | POA: Insufficient documentation

## 2019-02-19 MED ORDER — APIXABAN 5 MG PO TABS: 5.0000 mg | ORAL_TABLET | Freq: Two times a day (BID) | ORAL | 5 refills | Status: DC

## 2019-02-19 NOTE — Assessment & Plan Note (Signed)
Continues to have persistent nausea-we will continue Zofran 3-4 times a day Will not try Phenergan given previous adverse reaction Continue to try to push fluids Continue to eat 3 small meals a day-he is eating as much as she can will have EGD [place] and PET - needs to be done asap Diarrhea overall persistent and unchanged

## 2019-02-19 NOTE — Assessment & Plan Note (Signed)
Deficiency anemia Has not tolerated oral iron in the past Has never received an iron infusion Will recheck iron levels CBC checked yesterday If iron still low will arrange iron infusion, with daily help her energy level to some degree

## 2019-02-19 NOTE — Patient Instructions (Addendum)
  Blood work was ordered.      Medications reviewed and updated.  Changes include :   none      

## 2019-02-20 ENCOUNTER — Telehealth: Payer: Self-pay

## 2019-02-20 ENCOUNTER — Other Ambulatory Visit (HOSPITAL_COMMUNITY): Payer: Self-pay | Admitting: Internal Medicine

## 2019-02-20 ENCOUNTER — Other Ambulatory Visit: Payer: Self-pay

## 2019-02-20 DIAGNOSIS — D3A8 Other benign neuroendocrine tumors: Secondary | ICD-10-CM

## 2019-02-20 DIAGNOSIS — R197 Diarrhea, unspecified: Secondary | ICD-10-CM

## 2019-02-20 LAB — IRON,TIBC AND FERRITIN PANEL
%SAT: 25 % (calc) (ref 16–45)
Ferritin: 131 ng/mL (ref 16–288)
Iron: 33 ug/dL — ABNORMAL LOW (ref 45–160)
TIBC: 132 mcg/dL (calc) — ABNORMAL LOW (ref 250–450)

## 2019-02-20 MED ORDER — DIPHENOXYLATE-ATROPINE 2.5-0.025 MG PO TABS: 1.0000 | ORAL_TABLET | Freq: Three times a day (TID) | ORAL | 1 refills | Status: DC

## 2019-02-20 NOTE — Telephone Encounter (Signed)
Patient with diagnosis of UNPROVOKED PE on ELIQUIS for anticoagulation.    Procedure: ENDOSCOPIC ULTRASOUND Date of procedure: 03/07/2019  CrCl = 26ML/MIN Platelet count 295  *ELIQUIS long term therapy for non-cardiac indication.and managed by PCP Dr Quay Burow* DEFER RECOMMENDATION TO PCP *

## 2019-02-20 NOTE — Telephone Encounter (Signed)
The pt has been advised and aware of the COVID testing.  She will get her instructions on My Chart and call if she has any concerns or questions.  I have also sent an anti coag letter to Dr Quay Burow or Dr [name] for ok to hold [facility].  The pt will keep the appt for telehealth visit.

## 2019-02-20 NOTE — Telephone Encounter (Signed)
Yes, - see result note.  Ok to send script

## 2019-02-20 NOTE — Telephone Encounter (Signed)
[facility] Pre-operative Risk Assessment     Request for surgical clearance:     Endoscopy Procedure  What type of surgery is being performed?     EUS  When is this surgery scheduled?     03/07/19  What type of clearance is required ?   Pharmacy  Are there any medications that need to be held prior to surgery and how long? Eluquis  Practice name and name of physician performing surgery?      [facility]  What is your office phone and fax number?      Phone- [phone]  [phone]  Anesthesia type (None, local, MAC, general) ?       MAC

## 2019-02-20 NOTE — Addendum Note (Signed)
Addended by: Delice Bison E on: 02/20/2019 02:02 PM   Modules accepted: Orders

## 2019-02-20 NOTE — Telephone Encounter (Signed)
-----   Message from Irving Copas., MD sent at 02/20/2019 11:25 AM EST ----- Dr. Quay Burow, Thanks for reaching out.Unfortunately I do not have any clinic until next Tuesday.However, I will have advanced RN Gerarda Fraction reach out to the patient and try and get her on the books for an EUS EMR slot.  He can have that on the books and have our scheduled date next week.As I told the inpatient GI team when they had told me about uppercase I reviewed her imaging, it is extremely unlikely that even if this is a small duodenal carcinoid that this is the cause for all of her symptoms.  This is extremely rare for duodenal carcinoids to cause such a significant amount of diarrhea symptoms.  With that being said happy to be of assistance to try and evaluated.  The hardest part is that trying to pass the ultrasound scope into the D2/D3 region can be dangerous in regards to the size and caliber of that particular scope.  We will see what we can try and do.I hope that we can get some sense of things and hopefully help her but I still have concern that this may not give a diagnosis to her primary GI team.Rudine Rieger, can you please reach out to the patient and go ahead and look for a date to schedule a 90 minute EGD/EUS with EMR.  I think the 21st is available and should work. Thanks. GM ----- Message ----- From: [name], MD Sent: 02/19/2019   8:06 PM EST To: Irving Copas., MD  Valarie Merino,   I know your schedule is probably booked.  You have  a virtual visit with [name] next Tuesday to talk about the EGD [place].  She is struggling to avoid going back into the hospital and desperately needs a diagnosis.  She has seen Dr Earlean Shawl and has f/u with him.  If you can see her any sooner that would be great - I did advise her you were very busy and I was not sure if that was possible.....  Thank you, Billey Gosling

## 2019-02-20 NOTE — Telephone Encounter (Signed)
   Primary Cardiologist: Ena Dawley, MD  Chart reviewed as part of pre-operative protocol coverage. Patient is on eliquis for an unprovoked PE which is managed by her PCP. Will defer to PCP for recommendations for holding prior to upcoming procedure.    I will route this recommendation to the requesting party via Epic fax function, as well as the patient's PCP to address further and remove from pre-op pool.  Please call with questions.  Abigail Butts, PA-C 02/20/2019, 5:28 PM

## 2019-02-20 NOTE — Telephone Encounter (Signed)
Hold eliquis for two days prior to procedure

## 2019-02-20 NOTE — Telephone Encounter (Signed)
Copied from [place] [id number]. Topic: General - Inquiry >> Feb 20, 2019  8:44 AM [name], NT wrote: Reason for CRM: Pt called in stating she would like to speak with PCP and nurse in regards to iron infusion. Pt states her numbers have improved and would like to delay this.  Pt is also needing script for lomotil sent to [facility] on [address]. Please advise.

## 2019-02-20 NOTE — Telephone Encounter (Signed)
Noted  

## 2019-02-21 NOTE — Telephone Encounter (Signed)
The pt has been advised The pt has been advised of the information and verbalized understanding.

## 2019-02-25 ENCOUNTER — Telehealth: Payer: Self-pay | Admitting: Gastroenterology

## 2019-02-26 ENCOUNTER — Ambulatory Visit (INDEPENDENT_AMBULATORY_CARE_PROVIDER_SITE_OTHER): Payer: Medicare Other | Admitting: Gastroenterology

## 2019-02-26 ENCOUNTER — Encounter: Payer: Self-pay | Admitting: Gastroenterology

## 2019-02-26 VITALS — BP 124/77 | HR 63 | Temp 98.1°F | Ht 61.0 in | Wt 110.0 lb

## 2019-02-26 DIAGNOSIS — R933 Abnormal findings on diagnostic imaging of other parts of digestive tract: Secondary | ICD-10-CM | POA: Diagnosis not present

## 2019-02-26 DIAGNOSIS — D3A8 Other benign neuroendocrine tumors: Secondary | ICD-10-CM | POA: Diagnosis not present

## 2019-02-26 DIAGNOSIS — R935 Abnormal findings on diagnostic imaging of other abdominal regions, including retroperitoneum: Secondary | ICD-10-CM | POA: Diagnosis not present

## 2019-02-26 NOTE — Patient Instructions (Signed)
If you are age 76 or older, your body mass index should be between 23-30. Your Body mass index is 20.78 kg/m. If this is out of the aforementioned range listed, please consider follow up with your Primary Care Provider.  You have been scheduled for an endoscopy. Please follow written instructions given to you at your visit today. If you use inhalers (even only as needed), please bring them with you on the day of your procedure.  Due to recent changes in healthcare laws, you may see the results of your imaging and laboratory studies on MyChart before your provider has had a chance to review them.  We understand that in some cases there may be results that are confusing or concerning to you. Not all laboratory results come back in the same time frame and the provider may be waiting for multiple results in order to interpret others.  Please give [place] 48 hours in order for your provider to thoroughly review all the results before contacting the office for clarification of your results.   Thank you for choosing me and [facility].  Dr. Rush Landmark

## 2019-02-26 NOTE — Progress Notes (Signed)
[facility] VISIT   Primary Care Provider [name], MD [place] [address] [phone]  Referring Provider Dr. Bryan Lemma & Dr. Lyndel Safe & Dr. Earlean Shawl  Patient Profile: [name] is a 74 y.o. female with a pmh significant for CAD, A. fib, arthritis, polyarthralgia rheumatica, diabetes, colon polyps, hypertension, hyperlipidemia, osteopenia, chronic diarrhea.  The patient presents to the [facility] for an evaluation and management of problem(s) noted below:  Problem List 1. Abnormal CT of the abdomen   2. Abnormal endoscopy of upper gastrointestinal tract   3. Neuroendocrine tumor     I connected with  [name] on 02/26/19. I verified that I was speaking with the correct person using two identifiers. Due to the COVID-19 Pandemic, this service was provided via telemedicine using Audio-Only. Interactive audio and video telecommunications were attempted between this provider and patient, however failed, as patient did not have access to video capability and thus to provide timely and excellent care, we continued and completed visit with audio only. The patient was located at home. The provider was located in the office. The patient did consent to this visit and is aware of charges through their insurance as well as the limitations of evaluation and management by telemedicine. The patient was referred by providers as above. Other persons participating in this telemedicine service were none. Time spent on visit was greater than 40 minutes (34 minutes on phone by itself).   History of Present Illness Please see initial hospital consultation note by Dr. Bryan Lemma and progress note by Dr. Lyndel Safe for full details of HPI.  This is a patient who has been seen by Dr. Juanita Craver while undergoing an extensive work-up for multiple GI symptoms including nausea/vomiting/diarrhea/abdominal pain.  After an  extensive work-up had been completed, patient continued to have symptoms.  She required hospitalization.  She has had endoscopic evaluation by my partner Dr. Fuller Plan and subsequently when she was readmitted to the hospital in December was evaluated by Dr. Bryan Lemma and Dr. Lyndel Safe.  She had been seen and evaluated at [facility] for further work-up and management of her chronic symptoms.  Imaging that was performed during her hospital stay in December suggested a potential 7 mm neuroendocrine tumor in the D2 region.  It is for this reason, the patient is referred for consideration of endoscopic ultrasound and potential endoscopic mucosal resection of potential lesion.  The patient has been seen by Dr. Earlean Shawl of [facility] and is being further worked up under his care.  He is planning a dotatate scan later this week.  The patient's primary gastroenterologist at this point in time will be [facility] Dr. Earlean Shawl and/or if evaluated at [facility], Dr. Bryan Lemma or Dr. Lyndel Safe.  Interval History The patient states she is remaining frustrated about her current situation.  Thankfully, the diarrhea has been improving.  For the last 2 to 3 days she is actually having 1 bowel movement daily.  She was taking large doses of Lomotil and Imodium but that has decreased overall.  She has continued to have nausea but is not having vomiting at this time.  She has had a significant weight loss from June to now.  Although she states she has lost almost 40 pounds, the documentation in the chart suggest that in July 2020 she was 128 pounds.  She reports that she is 180 pounds at home currently.  She is awaiting the dotatate scan.  She is scheduled  for an upcoming attempted EUS with potential EMR.  She remains on her anticoagulation.  GI Review of Systems Positive as above Negative for dysphagia, odynophagia, melena, hematochezia  Review of Systems General: Denies fevers/chills HEENT: Denies oral  lesions Cardiovascular: Denies chest pain Pulmonary: Denies shortness of breath/nocturnal cough Gastroenterological: See HPI Genitourinary: Denies darkened urine Hematological: Denies easy bruising/bleeding Endocrine: Denies temperature intolerance Dermatological: Denies jaundice Psychological: Mood is anxious and scared   Medications Current Outpatient Medications  Medication Sig Dispense Refill  . apixaban (ELIQUIS) 5 MG TABS tablet Take 1 tablet (5 mg total) by mouth 2 (two) times daily. 60 tablet 5  . BENICAR 20 MG tablet Take one tablet daily 30 tablet 5  . clindamycin (CLEOCIN T) 1 % external solution Apply 1 application topically daily.     . clobetasol (TEMOVATE) 0.05 % external solution Apply 1 application topically every morning.     Marland Kitchen CREON 12000 units CPEP capsule     . diphenoxylate-atropine (LOMOTIL) 2.5-0.025 MG tablet Take 1 tablet by mouth 3 (three) times daily. 90 tablet 1  . halobetasol (ULTRAVATE) 0.05 % cream Apply 1 application topically 2 (two) times daily.     Marland Kitchen loperamide (IMODIUM) 2 MG capsule Take 1 capsule (2 mg total) by mouth 2 (two) times daily. 30 capsule 0  . omeprazole (PRILOSEC) 20 MG capsule Take 20 mg by mouth daily.    . ondansetron (ZOFRAN ODT) 8 MG disintegrating tablet Take 1 tablet (8 mg total) by mouth every 8 (eight) hours as needed for nausea or vomiting. 60 tablet 0  . ONETOUCH ULTRA test strip USE TEST STRIPS TO CHECK BLOOD SUGAR AT LEAST 3 TIMES DAILY 300 strip 1   No current facility-administered medications for this visit.    Allergies Allergies  Allergen Reactions  . Phenergan [Promethazine Hcl] Shortness Of Breath and Anxiety  . Amlodipine Swelling  . Clarithromycin Nausea Only    I can pass out   . Codeine Nausea Only    I can pass out   . Erythromycin     Other reaction(s): Vomiting (intolerance)  . Hydralazine Hcl     Drug-induced lupus - pt wishes to avoid  . Statins     Muscle pain   . Tetracycline Other (See  Comments)    unknown    Histories Past Medical History:  Diagnosis Date  . ALLERGIC RHINITIS   . ANEMIA-NOS   . Arthritis   . ASTHMA   . Carpal tunnel syndrome   . COLONIC POLYPS, HX OF   . Coronary artery disease    "mild CAD" noted on 12/05/17 in coronary CT scan  . DIABETES MELLITUS, TYPE II    diet controlled  . GERD   . HYPERLIPIDEMIA   . HYPERTENSION   . OSTEOPENIA   . PONV (postoperative nausea and vomiting)   . Psoriasis    severe, began soriatane 01/2012  . Rectal fissure   . Scoliosis    Past Surgical History:  Procedure Laterality Date  . benign rectal growth  2004   removed by Dr. Zella Richer  . BIOPSY  08/09/2018   Procedure: BIOPSY;  Surgeon: Carol Ada, MD;  Location: [facility] ENDOSCOPY;  Service: Endoscopy;;  . BIOPSY  08/22/2018   Procedure: BIOPSY;  Surgeon: Juanita Craver, MD;  Location: [facility] ENDOSCOPY;  Service: Endoscopy;;  . BREAST BIOPSY Right   . BREAST EXCISIONAL BIOPSY Left   . BREAST SURGERY  1988   biopsy  . CESAREAN SECTION    . COLONOSCOPY  N/A 08/22/2018   Procedure: COLONOSCOPY;  Surgeon: Juanita Craver, MD;  Location: [facility] ENDOSCOPY;  Service: Endoscopy;  Laterality: N/A;  . ESOPHAGOGASTRODUODENOSCOPY (EGD) WITH PROPOFOL N/A 08/04/2018   Procedure: ESOPHAGOGASTRODUODENOSCOPY (EGD) WITH PROPOFOL;  Surgeon: Ladene Artist, MD;  Location: [place];  Service: Gastroenterology;  Laterality: N/A;  . FLEXIBLE SIGMOIDOSCOPY N/A 08/04/2018   Procedure: FLEXIBLE SIGMOIDOSCOPY;  Surgeon: Ladene Artist, MD;  Location: [place];  Service: Gastroenterology;  Laterality: N/A;  . FLEXIBLE SIGMOIDOSCOPY N/A 08/09/2018   Procedure: FLEXIBLE SIGMOIDOSCOPY;  Surgeon: Carol Ada, MD;  Location: [place];  Service: Endoscopy;  Laterality: N/A;  . VIDEO BRONCHOSCOPY WITH ENDOBRONCHIAL NAVIGATION N/A 03/14/2018   Procedure: VIDEO BRONCHOSCOPY WITH ENDOBRONCHIAL NAVIGATION;  Surgeon: Collene Gobble, MD;  Location: [facility] OR;  Service: Thoracic;  Laterality: N/A;    Social History   Socioeconomic History  . Marital status: Single    Spouse name: Not on file  . Number of children: 1  . Years of education: 35  . Highest education level: Bachelor's degree (e.g., BA, AB, BS)  Occupational History  . Occupation: retired Careers adviser  Tobacco Use  . Smoking status: Former Smoker    Packs/day: 1.00    Years: 10.00    Pack years: 10.00    Quit date: 02/14/1974    Years since quitting: 45.0  . Smokeless tobacco: Never Used  Substance and Sexual Activity  . Alcohol use: No  . Drug use: No  . Sexual activity: Not on file  Other Topics Concern  . Not on file  Social History Narrative   Lives alone in a one story home.  Has one child and one grandchild.  Retired Careers adviser.  Education: college.    Social Determinants of Health   Financial Resource Strain:   . Difficulty of Paying Living Expenses: Not on file  Food Insecurity:   . Worried About Charity fundraiser in the Last Year: Not on file  . Ran Out of Food in the Last Year: Not on file  Transportation Needs:   . Lack of Transportation (Medical): Not on file  . Lack of Transportation (Non-Medical): Not on file  Physical Activity:   . Days of Exercise per Week: Not on file  . Minutes of Exercise per Session: Not on file  Stress:   . Feeling of Stress : Not on file  Social Connections:   . Frequency of Communication with Friends and Family: Not on file  . Frequency of Social Gatherings with Friends and Family: Not on file  . Attends Religious Services: Not on file  . Active Member of Clubs or Organizations: Not on file  . Attends Archivist Meetings: Not on file  . Marital Status: Not on file  Intimate Partner Violence:   . Fear of Current or Ex-Partner: Not on file  . Emotionally Abused: Not on file  . Physically Abused: Not on file  . Sexually Abused: Not on file   Family History  Problem Relation Age of  Onset  . Lung cancer Father   . Arthritis Other        Parents  . Asthma Other        parent, other relative  . Breast cancer Other        other relative  . Hypertension Other        parent, other relative  . Heart disease Other        parent,  other relative  . Heart disease Mother   . Asthma Mother   . Breast cancer Maternal Aunt 68  . Parkinson's disease Maternal Grandmother   . Rheumatic fever Maternal Grandfather    I have reviewed her medical, social, and family history in detail and updated the electronic medical record as necessary.    PHYSICAL EXAMINATION  Telehealth visit   REVIEW OF DATA  I reviewed the following data at the time of this encounter:  GI Procedures and Studies  Previously reviewed by the inpatient hospital team and there H&P  Laboratory Studies  Reviewed those in epic  Imaging Studies  December 2020 CT enterography IMPRESSION: 1. 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 Endoscopic assessment, perhaps even endoscopic ultrasound with potential for submucosal lesion may be warranted. 2. Signs of gastritis. 3. Signs of bowel distension likely related to ingested contrast with superimposed mild ileus. No distinct create transition. 4. Persistent branching nodularity in the left lung base, slightly decreased in size compared to the recent comparison study. 5. Collapsed cyst arising from the right kidney shows mildly thickened wall and internal heterogeneity. This is likely a collapsed partially hemorrhagic cysts, attention on follow-up. 6. 1 cm thickening of the endometrium, nonspecific on CT. Consider correlation with pelvic sonography if not performed. 7. Signs of branching nodularity in the left lung base diminished in size from more remote studies and non FDG avid. Likely post infectious scarring or mucocele. Attention on  follow-up. Aortic Atherosclerosis (ICD10-I70.0).   ASSESSMENT  Ms. Domzalski is a 74 y.o. female with a pmh significant for CAD, A. fib, arthritis, polyarthralgia rheumatica, diabetes, colon polyps, hypertension, hyperlipidemia, osteopenia, chronic diarrhea.  The patient is seen today for evaluation and management of:  1. Abnormal CT of the abdomen   2. Abnormal endoscopy of upper gastrointestinal tract   3. Neuroendocrine tumor    The patient is hemodynamically stable.  However, she has had significant issues over the course of the last 6 to 8 months.  The etiology of her symptoms is unclear and remains as such.  Although I am not taking over the care of this patient clinically, this is solely a clinic visit for discussion of endoscopic ultrasound and potential endoscopic mucosal resection, she does not want further work-up.  Her primary gastroenterologist remains Dr. Earlean Shawl at this time since she is no longer seeing Dr. Collene Mares.  I think it is reasonable for [place] to further evaluate the findings on CT scan and evaluate whether there may be a duodenal neuroendocrine tumor/carcinoid.  However, I have been very upfront with the patient that I do not believe that a single duodenal neuroendocrine tumor would be causing all of her symptoms.  It is extremely rare for that to cause a carcinoid-like syndrome.  Thus, I think it is unlikely to be the cause of things.  With that being said, the patient has an upcoming dotatate scan set up by her primary gastroenterologist later this week.  If it shows that there are other things that are concerning for potential lymph nodes that may be enlarged and we will consider the role of endoscopic sampling at that time.  I think it is worthwhile to attempt a potential EMR as well if we do see a lesion.  With that being said, the linear and radial echoendoscope some are quite rigid and there is increased risk of complication with trying to pass these  particular scopes into the D2/D2  region so we have to be very mindful of that.  The risks and benefits of endoscopic evaluation were discussed with the patient; these include but are not limited to the risk of perforation, infection, bleeding, missed lesions, lack of diagnosis, severe illness requiring hospitalization, as well as anesthesia and sedation related illnesses.  The risks of EUS including bleeding, infection, aspiration pneumonia and intestinal perforation were discussed as was the possibility it may not give a definitive diagnosis.  If a biopsy of the pancreas is done as part of the EUS, there is an additional risk of pancreatitis at the rate of about 1%.  It was explained that procedure related pancreatitis is typically mild, although can be severe and even life threatening, which is why we do not perform random pancreatic biopsies and only biopsy a lesion we feel is concerning enough to warrant the risk.  The patient is agreeable to proceed.  I am willing to be a part of this patient's care for the procedure, however I will not be her primary gastroenterologist moving forward and she will continue to follow with Dr. Earlean Shawl for need to be seen formally at the [facility].  All patient questions were answered, to the best of my ability, and the patient agrees to the aforementioned plan of action with follow-up as indicated.   PLAN  Await dotatate scan and ensure nothing else is found that would be concerning or pressing such that EUS would not be performed Plan to proceed with EGD/EUS with possible EMR next week as already scheduled Anticoagulation (apixaban) to be on hold for 1 to 2 days as per PCP/cardiology and will ensure that is complete All follow-up for this patient's care will be through her primary gastroenterology team at [facility], Dr. Earlean Shawl, she is being seen today solely for potential procedures discussion   No orders of the defined types were placed in this encounter.   New  Prescriptions   No medications on file   Modified Medications   No medications on file    Planned Follow Up No follow-ups on file.   Total Time in Face-to-Face and in Coordination of Care for patient including review/personal interpretation of prior testing, medical history, examination, medication adjustment, documentation with the EHR is greater than 40 minutes.  Justice Britain, MD [facility] Office # [phone]

## 2019-02-26 NOTE — H&P (View-Only) (Signed)
[facility] [facility] VISIT   Primary Care Provider Caitlin Rail, MD [place] [address] [phone]  Referring Provider Dr. Bryan Rios & Dr. Lyndel Rios & Dr. Earlean Rios  Patient Profile: [name] is a 74 y.o. female with a pmh significant for CAD, A. fib, arthritis, polyarthralgia rheumatica, diabetes, colon polyps, hypertension, hyperlipidemia, osteopenia, chronic diarrhea.  The patient presents to the [name] for an evaluation and management of problem(s) noted below:  Problem List 1. Abnormal CT of the abdomen   2. Abnormal endoscopy of upper gastrointestinal tract   3. Neuroendocrine tumor     I connected with  [name] on 02/26/19. I verified that I was speaking with the correct person using two identifiers. Due to the COVID-19 Pandemic, this service was provided via telemedicine using Audio-Only. Interactive audio and video telecommunications were attempted between this provider and patient, however failed, as patient did not have access to video capability and thus to provide timely and excellent care, we continued and completed visit with audio only. The patient was located at home. The provider was located in the office. The patient did consent to this visit and is aware of charges through their insurance as well as the limitations of evaluation and management by telemedicine. The patient was referred by providers as above. Other persons participating in this telemedicine service were none. Time spent on visit was greater than 40 minutes (34 minutes on phone by itself).   History of Present Illness Please see initial [facility] consultation note by Dr. Bryan Rios and progress note by Dr. Lyndel Rios for full details of HPI.  This is a patient who has been seen by [name] while undergoing an extensive work-up for multiple GI symptoms including nausea/vomiting/diarrhea/abdominal pain.  After an  extensive work-up had been completed, patient continued to have symptoms.  She required hospitalization.  She has had endoscopic evaluation by my partner Dr. Fuller [facility]ios and subsequently when she was readmitted to the [facility] in December was evaluated by Dr. Bryan Rios and Dr. Lyndel Rios.  She had been seen and evaluated at [name] for further work-up and management of her chronic symptoms.  Imaging that was performed during her [facility] stay in December suggested a potential 7 mm neuroendocrine tumor in the D2 region.  It is for this reason, the patient is referred for consideration of endoscopic ultrasound and potential endoscopic mucosal resection of potential lesion.  The patient has been seen by Dr. Earlean [facility] of [name] and is being further worked up under his care.  He is planning a dotatate scan later this week.  The patient's primary gastroenterologist at this point in time will be [name] Dr. Earlean Rios and/or if evaluated at [name] Dr. Bryan Rios or Dr. Lyndel Rios.  Interval History The patient states she is remaining frustrated about her current situation.  Thankfully, the diarrhea has been improving.  For the last 2 to 3 days she is actually having 1 bowel movement daily.  She was taking large doses of Lomotil and Imodium but that has decreased overall.  She has continued to have nausea but is not having vomiting at this time.  She has had a significant weight loss from June to now.  Although she states she has lost almost 40 pounds, the documentation in the chart suggest that in July 2020 she was 128 pounds.  She reports that she is 180 pounds at home currently.  She is awaiting the dotatate scan.  She is scheduled  for an upcoming attempted EUS with potential EMR.  She remains on her anticoagulation.  GI Review of Systems Positive as above Negative for dysphagia, odynophagia, melena, hematochezia  Review of Systems General: Denies fevers/chills HEENT: Denies oral  lesions Cardiovascular: Denies chest pain Pulmonary: Denies shortness of breath/nocturnal cough Gastroenterological: See HPI Genitourinary: Denies darkened urine Hematological: Denies easy bruising/bleeding Endocrine: Denies temperature intolerance Dermatological: Denies jaundice Psychological: Mood is anxious and scared   Medications Current Outpatient Medications  Medication Sig Dispense Refill  . apixaban (ELIQUIS) 5 MG TABS tablet Take 1 tablet (5 mg total) by mouth 2 (two) times daily. 60 tablet 5  . BENICAR 20 MG tablet Take one tablet daily 30 tablet 5  . clindamycin (CLEOCIN T) 1 % external solution Apply 1 application topically daily.     . clobetasol (TEMOVATE) 0.05 % external solution Apply 1 application topically every morning.     Marland Kitchen CREON 12000 units CPEP capsule     . diphenoxylate-atropine (LOMOTIL) 2.5-0.025 MG tablet Take 1 tablet by mouth 3 (three) times daily. 90 tablet 1  . halobetasol (ULTRAVATE) 0.05 % cream Apply 1 application topically 2 (two) times daily.     Marland Kitchen loperamide (IMODIUM) 2 MG capsule Take 1 capsule (2 mg total) by mouth 2 (two) times daily. 30 capsule 0  . omeprazole (PRILOSEC) 20 MG capsule Take 20 mg by mouth daily.    . ondansetron (ZOFRAN ODT) 8 MG disintegrating tablet Take 1 tablet (8 mg total) by mouth every 8 (eight) hours as needed for nausea or vomiting. 60 tablet 0  . ONETOUCH ULTRA test strip USE TEST STRIPS TO CHECK BLOOD SUGAR AT LEAST 3 TIMES DAILY 300 strip 1   No current facility-administered medications for this visit.    Allergies Allergies  Allergen Reactions  . Phenergan [Promethazine Hcl] Shortness Of Breath and Anxiety  . Amlodipine Swelling  . Clarithromycin Nausea Only    I can pass out   . Codeine Nausea Only    I can pass out   . Erythromycin     Other reaction(s): Vomiting (intolerance)  . Hydralazine Hcl     Drug-induced lupus - pt wishes to avoid  . Statins     Muscle pain   . Tetracycline Other (See  Comments)    unknown    Histories Past Medical History:  Diagnosis Date  . ALLERGIC RHINITIS   . ANEMIA-NOS   . Arthritis   . ASTHMA   . Carpal tunnel syndrome   . COLONIC POLYPS, HX OF   . Coronary artery disease    "mild CAD" noted on 12/05/17 in coronary CT scan  . DIABETES MELLITUS, TYPE II    diet controlled  . GERD   . HYPERLIPIDEMIA   . HYPERTENSION   . OSTEOPENIA   . PONV (postoperative nausea and vomiting)   . Psoriasis    severe, began soriatane 01/2012  . Rectal fissure   . Scoliosis    Past Surgical History:  Procedure Laterality Date  . benign rectal growth  2004   removed by Dr. Zella Richer  . BIOPSY  08/09/2018   Procedure: BIOPSY;  Surgeon: Carol Ada, MD;  Location[facility] ENDOSCOPY;  Service: Endoscopy;;  . BIOPSY  08/22/2018   Procedure: BIOPSY;  Surgeon: Caitlin Craver, MD;  Location[facility] ENDOSCOPY;  Service: Endoscopy;;  . BREAST BIOPSY Right   . BREAST EXCISIONAL BIOPSY Left   . BREAST [facility]  1988   biopsy  . CESAREAN SECTION    . COLONOSCOPY  N/A 08/22/2018   Procedure: COLONOSCOPY;  Surgeon: Caitlin Craver, MD;  Location: [facility] END[facility]COPY;  Service: Endoscopy;  Laterality: N/A;  . ESOPHAGOGASTRODUODENOSCOPY (EGD) WITH PROPOFOL N/A 08/04/2018   Procedure: ESOPHAGOGASTRODUODENOSCOPY (EGD) WITH PROPOFOL;  Surgeon: Ladene Artist, MD;  Location: [facility];  Service: Gastroenterology;  Laterality: N/A;  . FLEXIBLE SIGMOIDOSCOPY N/A 08/04/2018   Procedure: FLEXIBLE SIGMOIDOSCOPY;  Surgeon: Ladene Artist, MD;  Location: [place];  Service: Gastroenterology;  Laterality: N/A;  . FLEXIBLE SIGMOIDOSCOPY N/A 08/09/2018   Procedure: FLEXIBLE SIGMOIDOSCOPY;  Surgeon: Carol Ada, MD;  Location: [place];  Service: Endoscopy;  Laterality: N/A;  . VIDEO BRONCHOSCOPY WITH ENDOBRONCHIAL NAVIGATION N/A 03/14/2018   Procedure: VIDEO BRONCHOSCOPY WITH ENDOBRONCHIAL NAVIGATION;  Surgeon: Collene Gobble, MD;  Location: [facility] OR;  Service: Thoracic;  Laterality: N/A;    Social History   Socioeconomic History  . Marital status: Single    Spouse name: Not on file  . Number of children: 1  . Years of education: 52  . Highest education level: Bachelor's degree (e.g., BA, AB, BS)  Occupational History  . Occupation: retired Careers adviser  Tobacco Use  . Smoking status: Former Smoker    Packs/day: 1.00    Years: 10.00    Pack years: 10.00    Quit date: 02/14/1974    Years since quitting: 45.0  . Smokeless tobacco: Never Used  Substance and Sexual Activity  . Alcohol use: No  . Drug use: No  . Sexual activity: Not on file  Other Topics Concern  . Not on file  Social History Narrative   Lives alone in a one story home.  Has one child and one grandchild.  Retired Careers adviser.  Education: college.    Social Determinants of Health   Financial Resource Strain:   . Difficulty of Paying Living Expenses: Not on file  Food Insecurity:   . Worried About Charity fundraiser in the Last Year: Not on file  . Ran Out of Food in the Last Year: Not on file  Transportation Needs:   . Lack of Transportation (Medical): Not on file  . Lack of Transportation (Non-Medical): Not on file  Physical Activity:   . Days of Exercise per Week: Not on file  . Minutes of Exercise per Session: Not on file  Stress:   . Feeling of Stress : Not on file  Social Connections:   . Frequency of Communication with Friends and Family: Not on file  . Frequency of Social Gatherings with Friends and Family: Not on file  . Attends Religious Services: Not on file  . Active Member of Clubs or Organizations: Not on file  . Attends Archivist Meetings: Not on file  . Marital Status: Not on file  Intimate Partner Violence:   . Fear of Current or Ex-Partner: Not on file  . Emotionally Abused: Not on file  . Physically Abused: Not on file  . Sexually Abused: Not on file   Family History  Problem Relation Age of  Onset  . Lung cancer Father   . Arthritis Other        Parents  . Asthma Other        parent, other relative  . Breast cancer Other        other relative  . Hypertension Other        parent, other relative  . Heart disease Other        parent,  other relative  . Heart disease Mother   . Asthma Mother   . Breast cancer Maternal Aunt 68  . Parkinson's disease Maternal Grandmother   . Rheumatic fever Maternal Grandfather    I have reviewed her medical, social, and family history in detail and updated the electronic medical record as necessary.    PHYSICAL EXAMINATION  Telehealth visit   REVIEW OF DATA  I reviewed the following data at the time of this encounter:  GI Procedures and Studies  Previously reviewed by the inpatient [facility] team and there H&P  Laboratory Studies  Reviewed those in epic  Imaging Studies  December 2020 CT enterography IMPRESSION: 1. 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 Endoscopic assessment, perhaps even endoscopic ultrasound with potential for submucosal lesion may be warranted. 2. Signs of gastritis. 3. Signs of bowel distension likely related to ingested contrast with superimposed mild ileus. No distinct create transition. 4. Persistent branching nodularity in the left lung base, slightly decreased in size compared to the recent comparison study. 5. Collapsed cyst arising from the right kidney shows mildly thickened wall and internal heterogeneity. This is likely a collapsed partially hemorrhagic cysts, attention on follow-up. 6. 1 cm thickening of the endometrium, nonspecific on CT. Consider correlation with pelvic sonography if not performed. 7. Signs of branching nodularity in the left lung base diminished in size from more remote studies and non FDG avid. Likely post infectious scarring or mucocele. Attention on  follow-up. Aortic Atherosclerosis (ICD10-I70.0).   ASSESSMENT  Ms. Babe is a 74 y.o. female with a pmh significant for CAD, A. fib, arthritis, polyarthralgia rheumatica, diabetes, colon polyps, hypertension, hyperlipidemia, osteopenia, chronic diarrhea.  The patient is seen today for evaluation and management of:  1. Abnormal CT of the abdomen   2. Abnormal endoscopy of upper gastrointestinal tract   3. Neuroendocrine tumor    The patient is hemodynamically stable.  However, she has had significant issues over the course of the last 6 to 8 months.  The etiology of her symptoms is unclear and remains as such.  Although I am not taking over the care of this patient clinically, this is solely a [facility] visit for discussion of endoscopic ultrasound and potential endoscopic mucosal resection, she does not want further work-up.  Her primary gastroenterologist remains Dr. Earlean Rios at this time since she is no longer seeing Dr. Collene Mares.  I think it is reasonable for [place] to further evaluate the findings on CT scan and evaluate whether there may be a duodenal neuroendocrine tumor/carcinoid.  However, I have been very upfront with the patient that I do not believe that a single duodenal neuroendocrine tumor would be causing all of her symptoms.  It is extremely rare for that to cause a carcinoid-like syndrome.  Thus, I think it is unlikely to be the cause of things.  With that being said, the patient has an upcoming dotatate scan set up by her primary gastroenterologist later this week.  If it shows that there are other things that are concerning for potential lymph nodes that may be enlarged and we will consider the role of endoscopic sampling at that time.  I think it is worthwhile to attempt a potential EMR as well if we do see a lesion.  With that being said, the linear and radial echoendoscope some are quite rigid and there is increased risk of complication with trying to pass these  particular scopes into the D2/D2  region so we have to be very mindful of that.  The risks and benefits of endoscopic evaluation were discussed with the patient; these include but are not limited to the risk of perforation, infection, bleeding, missed lesions, lack of diagnosis, severe illness requiring hospitalization, as well as anesthesia and sedation related illnesses.  The risks of EUS including bleeding, infection, aspiration pneumonia and intestinal perforation were discussed as was the possibility it may not give a definitive diagnosis.  If a biopsy of the pancreas is done as part of the EUS, there is an additional risk of pancreatitis at the rate of about 1%.  It was explained that procedure related pancreatitis is typically mild, although can be severe and even life threatening, which is why we do not perform random pancreatic biopsies and only biopsy a lesion we feel is concerning enough to warrant the risk.  The patient is agreeable to proceed.  I am willing to be a part of this patient's care for the procedure, however I will not be her primary gastroenterologist moving forward and she will continue to follow with Dr. Earlean [facility]ios for need to be seen formally at the [facility].  All patient questions were answered, to the best of my ability, and the patient agrees to the aforementioned [facility] of action with follow-up as indicated.   [facility]  Await dotatate scan and ensure nothing else is found that would be concerning or pressing such that EUS would not be performed [facility] to proceed with EGD/EUS with possible EMR next week as already scheduled Anticoagulation (apixaban) to be on hold for 1 to 2 days as per PCP/cardiology and will ensure that is complete All follow-up for this patient's care will be through her primary gastroenterology team at [facility], [facility], [facility], Dr. Earlean [facility], she is being seen today solely for potential procedures discussion   No orders of the defined types were placed in this encounter.   New  Prescriptions   No medications on file   Modified Medications   No medications on file    Planned Follow Up No follow-ups on file.   Total Time in Face-to-Face and in Coordination of Care for patient including review/personal interpretation of prior testing, medical history, examination, medication adjustment, documentation with the EHR is greater than 40 minutes.  Justice Britain, MD [facility] Office # [phone]

## 2019-02-27 DIAGNOSIS — R935 Abnormal findings on diagnostic imaging of other abdominal regions, including retroperitoneum: Secondary | ICD-10-CM | POA: Insufficient documentation

## 2019-02-27 DIAGNOSIS — R933 Abnormal findings on diagnostic imaging of other parts of digestive tract: Secondary | ICD-10-CM | POA: Insufficient documentation

## 2019-02-27 DIAGNOSIS — D3A8 Other benign neuroendocrine tumors: Secondary | ICD-10-CM | POA: Insufficient documentation

## 2019-02-28 ENCOUNTER — Other Ambulatory Visit: Payer: Self-pay

## 2019-02-28 ENCOUNTER — Ambulatory Visit (HOSPITAL_COMMUNITY)
Admission: RE | Admit: 2019-02-28 | Discharge: 2019-02-28 | Disposition: A | Discharge status: Home / Self Care | Payer: Medicare Other | Source: Ambulatory Visit | Attending: Internal Medicine | Admitting: Internal Medicine

## 2019-02-28 DIAGNOSIS — R197 Diarrhea, unspecified: Secondary | ICD-10-CM | POA: Diagnosis present

## 2019-02-28 DIAGNOSIS — D3A8 Other benign neuroendocrine tumors: Secondary | ICD-10-CM | POA: Insufficient documentation

## 2019-02-28 MED ORDER — GALLIUM GA 68 DOTATATE IV KIT
2.6000 | PACK | Freq: Once | INTRAVENOUS | Status: AC | PRN
  Administered 2019-02-28: 2.6 via INTRAVENOUS

## 2019-02-28 NOTE — Progress Notes (Signed)
PIV consult: Site established by nuclear med staff. Cancel consult.

## 2019-03-04 ENCOUNTER — Other Ambulatory Visit (HOSPITAL_COMMUNITY)
Admission: RE | Admit: 2019-03-04 | Discharge: 2019-03-04 | Disposition: A | Discharge status: Home / Self Care | Payer: Medicare Other | Source: Ambulatory Visit | Attending: Gastroenterology | Admitting: Gastroenterology

## 2019-03-04 DIAGNOSIS — Z01812 Encounter for preprocedural laboratory examination: Secondary | ICD-10-CM | POA: Insufficient documentation

## 2019-03-04 DIAGNOSIS — Z20822 Contact with and (suspected) exposure to covid-19: Secondary | ICD-10-CM | POA: Diagnosis not present

## 2019-03-05 LAB — NOVEL CORONAVIRUS, NAA (HOSP ORDER, SEND-OUT TO REF LAB; TAT 18-24 HRS): SARS-CoV-2, NAA: NOT DETECTED

## 2019-03-06 ENCOUNTER — Encounter (HOSPITAL_COMMUNITY): Payer: Self-pay | Admitting: Gastroenterology

## 2019-03-06 ENCOUNTER — Other Ambulatory Visit: Payer: Self-pay

## 2019-03-06 ENCOUNTER — Telehealth: Payer: Self-pay | Admitting: Gastroenterology

## 2019-03-06 ENCOUNTER — Telehealth: Payer: Self-pay

## 2019-03-06 NOTE — Telephone Encounter (Signed)
The pt was advised to arrive at 930 am for an 1110 am appt at [facility] with Dr Rush Landmark The pt has been advised of the information and verbalized understanding.

## 2019-03-06 NOTE — Progress Notes (Signed)
Pt made aware to check CBG every 2 hours prior to arrival to hospital on DOS. Pt made aware to treat a CG < 70 with 4 glucose tabs, wait 15 minutes after intervention to recheck CBG, if CBG remains < 70, call the Endo unit to speak with a nurse. Pt verbalized understanding of all pre-op instructions.

## 2019-03-06 NOTE — Telephone Encounter (Signed)
Copied from [place] [id number]. Topic: General - Other >> Mar 06, 2019  3:19 PM Keene Breath wrote: Reason for CRM: Having an endoscopy tomorrow. Having new symptoms, tingling in hands, and swelling in both feet.  CB# [phone]

## 2019-03-06 NOTE — Progress Notes (Signed)
Anesthesia Chart Review: SAME DAY WORK-UP (ENDO)  Case: [id number] Date/Time: 03/07/19 1110   Procedures:      UPPER ENDOSCOPIC ULTRASOUND (EUS) RADIAL (N/A )     ESOPHAGOGASTRODUODENOSCOPY (EGD) WITH PROPOFOL (N/A )     ENDOSCOPIC MUCOSAL RESECTION (N/A )   Anesthesia type: Monitor Anesthesia Care   Pre-op diagnosis: neuroendocrine tumor   Location: [facility] [facility]   Surgeons: Mansouraty, Telford Nab., MD      DISCUSSION: Patient is a 74 year old female scheduled for the above procedure. Imaging performed during December hospitalization suggested a potential 7 mm neuroendocrine tumor in the D2 region. She was referred to Dr. Rush Landmark for consideration of endoscopic ultrasound and potential endoscopic mucosal resection of potential lesion.    History includes former smoker (quit 1976), post-operative N/V, asthma, CAD (mild CAD by coronary CT 2019), DM2 (diet controlled), GERD, HTN, HLD, pulmonary nodules (s/p bronchoscopy 03/14/18, atypical cells, no malignant cells seen), scoliosis, psoriasis, neuroendocrine tumor, PE (12/22/17).  - Several admissions since 07/2018 for N/V/D and significant weight loss. See GI notes for details, but work-up included EGD, colonoscopy, CT scan and extensive serologic evaluations. Had been treated for possible pancreatic insufficiency.  Last admission 01/30/19-02/05/19 with N/V/D/dehydration and AKI (Cr 4). Imaging suggestive of neuroendocrine tumor. Outpatient GI follow-uo with PET scan gallium-68 dotatate study recommended. Renal function back to baseline by discharge.  PCP Dr. Quay Burow advised holding Eliquis for 2 days prior to procedure. 03/04/19 COVID-19 test negative. Anesthesia team to evaluate on the day of procedure.   VS: There were no vitals taken for this visit.  Wt Readings from Last 3 Encounters:  02/26/19 49.9 kg  02/19/19 50.1 kg  02/18/19 50.3 kg   BP Readings from Last 3 Encounters:  02/26/19 124/77  02/19/19 (!) 168/94  02/18/19  (!) 151/75   Pulse Readings from Last 3 Encounters:  02/26/19 63  02/19/19 71  02/18/19 73    PROVIDERS: [name], MD is PCP. Last evaluation 02/19/19. Ena Dawley, MD is cardiologist. Last evaluation 01/16/19.  Simonne Maffucci, MD is pulmonologist. Last visit 09/18/18 with Christinia Gully, MD as second opinion regarding lung nodules. One year follow-up CT recommended.  Michel Bickers, MD is ID. Last evaluation 01/02/19 for bilateral pulmonology nodules with history of immunosuppressant medications. Negative respiratory and AFB cultures. No evidence of active pneumonia, so no empiric treatment for possible Mycobacterium avium or other pneumonias at that time. Bo Merino, MD is rheumatologist. Last evaluation 12/24/18. Referral made to [facility], and seen by Lamonte Richer, DO on 01/16/19 for polyarthralgia with positive ANA. Richmond Campbell, MD is GI (see [place]) Threasa Alpha, MD is dermatologist ([place]) Curt Bears, MD is HEM-ONC. Last evaluation 02/18/19.    LABS: Last lab results include: Lab Results  Component Value Date   WBC 7.1 02/18/2019   HGB 10.7 (L) 02/18/2019   HCT 32.7 (L) 02/18/2019   PLT 295 02/18/2019   GLUCOSE 89 02/18/2019   ALT 30 02/18/2019   AST 33 02/18/2019   NA 139 02/18/2019   K 3.5 02/18/2019   CL 102 02/18/2019   CREATININE 0.96 02/18/2019   BUN 8 02/18/2019   CO2 27 02/18/2019   TSH 2.889 08/09/2018   HGBA1C 5.8 12/19/2018    IMAGES: PET Scan 02/28/19:  IMPRESSION: 1. No focal activity within the duodenum to localize well differentiated neuroendocrine tumor. 2. No evidence of well differentiated neuroendocrine tumor elsewhere on skull base to thigh DOTATATE PET scan. 3.  Hepatic steatosis. 4. Stable pulmonary findings compared to CT 08/15/2018. CT abd/pelvis with contrast (enterography) 02/02/19: IMPRESSION: 1. 7 mm enhancing focus in the duodenum may represent a small neuroendocrine tumor based  on the hypervascular features that are seen on today's study. Endoscopic assessment, perhaps even endoscopic ultrasound with potential for submucosal lesion may be warranted. 2. Signs of gastritis. 3. Signs of bowel distension likely related to ingested contrast with superimposed mild ileus. No distinct create transition. 4. Persistent branching nodularity in the left lung base, slightly decreased in size compared to the recent comparison study. 5. Collapsed cyst arising from the right kidney shows mildly thickened wall and internal heterogeneity. This is likely a collapsed partially hemorrhagic cysts, attention on follow-up. 6. 1 cm thickening of the endometrium, nonspecific on CT. Consider correlation with pelvic sonography if not performed. 7. Signs of branching nodularity in the left lung base diminished in size from more remote studies and non FDG avid. Likely post infectious scarring or mucocele. Attention on follow-up. Aortic Atherosclerosis (ICD10-I70.0).  US Pelvis 02/02/19: IMPRESSION: 1. The endometrial stripe complex was measured at 9.2 mm in thickness. However, I believe this contains fluid within the canal. The endometrium itself does not appear to be particularly thickened. However, evaluation is limited based on transabdominal imaging. Recommend endovaginal imaging for more complete evaluation.  CT Chest 08/15/18: IMPRESSION: 1. Areas of peribronchovascular nodularity and nodular consolidation in the lungs bilaterally appear slightly improved from 12/22/2017. A benign post infectious/postinflammatory etiology is strongly favored. Follow-up CT chest without contrast in 1 year is recommended to ensure continued stability, as clinically indicated. 2. Aortic atherosclerosis (ICD10-170.0). Coronary artery calcification, including left main coronary artery.   EKG: 01/30/19: NSR   CV: CT coronary 12/05/17: IMPRESSION: 1. Coronary calcium score of 6. This was 84  percentile for age and sex matched control. 2. Normal coronary origin with right dominance. 3. There is mild CAD in the proximal LAD, aggressive risk factor modification is recommended SOM most probably secondary to obesity and deconditioning.   Echo 02/01/16: Study Conclusions - Left ventricle: The cavity size was normal. Wall thickness was   normal. Systolic function was vigorous. The estimated ejection   fraction was in the range of 65% to 70%. Mildly abnormal GLPSS at   -16% wtih anteroseptal strain abnormality. Wall motion was   normal; there were no regional wall motion abnormalities. Doppler   parameters are consistent with abnormal left ventricular   relaxation (grade 1 diastolic dysfunction). The E/e&' ratio is   between 8-15, suggesting indeterminate LV filling pressure. - Aortic valve: Trileaflet. Sclerosis without stenosis. There was   no regurgitation. - Left atrium: The atrium was normal in size. - Tricuspid valve: There was trivial regurgitation. - Pulmonary arteries: PA peak pressure: 26 mm Hg (S). - Inferior vena cava: The vessel was normal in size. The   respirophasic diameter changes were in the normal range (>= 50%),   consistent with normal central venous pressure. Impressions: - LVEF 65-70%, normal wall thickness and motion, abnormal GLPSS at   -16% with anteroseptal strain abnormality, diastolic dysfunction,   indeterminate LV filling pressure, aortic valve sclerosis, normal   LA size, trivial TR, normal RVSP.   Nuclear stress test 01/05/16:  Nuclear stress EF: 83%.  The left ventricular ejection fraction is hyperdynamic (>65%).  There was no ST segment deviation noted during stress.  The study is normal. Normal stress nuclear study with no ischemia or infarction; EF 83 with normal wall motion.    Carotid US  05/27/15: Mixed plaque, bilaterally. 1-39% bilateral ICA stenosis. Normal subclavian arteries, bilaterally. Patent vertebral arteries with  antegrade flow. Abnormal structure noted in the right thyroid, as described above. Consider a dedicated thyroid ultrasound. [s/p thyroid US 06/11/15 & 05/26/17, did not meet criteria for biopsy] f/u PRN.   Past Medical History:  Diagnosis Date  . ALLERGIC RHINITIS   . ANEMIA-NOS   . Arthritis   . ASTHMA   . Carpal tunnel syndrome   . COLONIC POLYPS, HX OF   . Coronary artery disease    "mild CAD" noted on 12/05/17 in coronary CT scan  . DIABETES MELLITUS, TYPE II    diet controlled  . GERD   . HYPERLIPIDEMIA   . HYPERTENSION   . Neuroendocrine tumor   . OSTEOPENIA   . PONV (postoperative nausea and vomiting)    " it relaxes my bladder muscles and I have been known to pee all over the place"  . Psoriasis    severe, began soriatane 01/2012  . Pulmonary embolism ([place])   . Rectal fissure   . Scoliosis   . Wears glasses     Past Surgical History:  Procedure Laterality Date  . benign rectal growth  2004   removed by Dr. Zella Richer  . BIOPSY  08/09/2018   Procedure: BIOPSY;  Surgeon: Carol Ada, MD;  Location: [facility] ENDOSCOPY;  Service: Endoscopy;;  . BIOPSY  08/22/2018   Procedure: BIOPSY;  Surgeon: Juanita Craver, MD;  Location: [facility] ENDOSCOPY;  Service: Endoscopy;;  . BREAST BIOPSY Right   . BREAST EXCISIONAL BIOPSY Left   . BREAST SURGERY  1988   biopsy  . CESAREAN SECTION    . COLONOSCOPY N/A 08/22/2018   Procedure: COLONOSCOPY;  Surgeon: Juanita Craver, MD;  Location: [facility] ENDOSCOPY;  Service: Endoscopy;  Laterality: N/A;  . ESOPHAGOGASTRODUODENOSCOPY (EGD) WITH PROPOFOL N/A 08/04/2018   Procedure: ESOPHAGOGASTRODUODENOSCOPY (EGD) WITH PROPOFOL;  Surgeon: Ladene Artist, MD;  Location: [place];  Service: Gastroenterology;  Laterality: N/A;  . FLEXIBLE SIGMOIDOSCOPY N/A 08/04/2018   Procedure: FLEXIBLE SIGMOIDOSCOPY;  Surgeon: Ladene Artist, MD;  Location: [place];  Service: Gastroenterology;  Laterality: N/A;  . FLEXIBLE SIGMOIDOSCOPY N/A 08/09/2018   Procedure:  FLEXIBLE SIGMOIDOSCOPY;  Surgeon: Carol Ada, MD;  Location: [place];  Service: Endoscopy;  Laterality: N/A;  . VIDEO BRONCHOSCOPY WITH ENDOBRONCHIAL NAVIGATION N/A 03/14/2018   Procedure: VIDEO BRONCHOSCOPY WITH ENDOBRONCHIAL NAVIGATION;  Surgeon: Collene Gobble, MD;  Location: [facility] OR;  Service: Thoracic;  Laterality: N/A;    MEDICATIONS: No current facility-administered medications for this encounter.   Marland Kitchen apixaban (ELIQUIS) 5 MG TABS tablet  . Ascorbic Acid (VITAMIN C GUMMIE PO)  . BENICAR 20 MG tablet  . Cholecalciferol (VITAMIN D3 GUMMIES ADULT PO)  . clindamycin (CLEOCIN T) 1 % external solution  . clobetasol (TEMOVATE) 0.05 % external solution  . Coenzyme Q10 (COQ10 GUMMIES ADULT PO)  . CREON 12000 units CPEP capsule  . diphenoxylate-atropine (LOMOTIL) 2.5-0.025 MG tablet  . halobetasol (ULTRAVATE) 0.05 % cream  . loperamide (IMODIUM) 2 MG capsule  . loratadine (CLARITIN) 10 MG tablet  . Multiple Vitamins-Minerals (ADULT GUMMY PO)  . omeprazole (PRILOSEC) 20 MG capsule  . ondansetron (ZOFRAN ODT) 8 MG disintegrating tablet  . oxymetazoline (AFRIN) 0.05 % nasal spray  . Polyethyl Glycol-Propyl Glycol (LUBRICANT EYE DROPS) 0.4-0.3 % SOLN  . ONETOUCH ULTRA test strip     Myra Gianotti, PA-C Surgical Short Stay/Anesthesiology [facility] Phone [phone] [facility] Phone [phone] 03/06/2019 3:50 PM

## 2019-03-06 NOTE — Telephone Encounter (Signed)
If blood pressure is primarily less than 145/90 then continue to hold Benicar for now.  Have her update [place] next week.  If she continues to remain stable then yes increase Benicar.

## 2019-03-06 NOTE — Progress Notes (Signed)
Pre op call done, patient states she has been quarantined since COVID test. All questions addressed, patient to be here by 0945 am

## 2019-03-06 NOTE — Anesthesia Preprocedure Evaluation (Addendum)
Anesthesia Evaluation  Patient identified by MRN, date of birth, ID band Patient awake    Reviewed: Allergy & Precautions, H&P , NPO status , Patient's Chart, lab work & pertinent test results  History of Anesthesia Complications (+) PONV  Airway Mallampati: III  TM Distance: >3 FB Neck ROM: Full    Dental no notable dental hx. (+) Teeth Intact, Dental Advisory Given   Pulmonary asthma , former smoker,    Pulmonary exam normal breath sounds clear to auscultation       Cardiovascular hypertension, Pt. on medications  Rhythm:Regular Rate:Normal     Neuro/Psych negative psych ROS   GI/Hepatic Neg liver ROS, GERD  ,  Endo/Other  diabetes, Well Controlled, Type 2  Renal/GU negative Renal ROS  negative genitourinary   Musculoskeletal  (+) Arthritis , Osteoarthritis,    Abdominal   Peds  Hematology  (+) Blood dyscrasia, anemia ,   Anesthesia Other Findings   Reproductive/Obstetrics negative OB ROS                           Anesthesia Physical Anesthesia Plan  ASA: II  Anesthesia Plan: MAC   Post-op Pain Management:    Induction: Intravenous  PONV Risk Score and Plan: 3 and Propofol infusion and Treatment may vary due to age or medical condition  Airway Management Planned: Nasal Cannula  Additional Equipment:   Intra-op Plan:   Post-operative Plan:   Informed Consent: I have reviewed the patients History and Physical, chart, labs and discussed the procedure including the risks, benefits and alternatives for the proposed anesthesia with the patient or authorized representative who has indicated his/her understanding and acceptance.     Dental advisory given  Plan Discussed with: CRNA  Anesthesia Plan Comments: (PAT note written 03/06/2019 by Myra Gianotti, PA-C. )       Anesthesia Quick Evaluation

## 2019-03-06 NOTE — Telephone Encounter (Signed)
Patient called states she was told a different time for her procedure scheduled tomorrow and would like to confirm it with you.

## 2019-03-06 NOTE — Telephone Encounter (Signed)
Her feet swelling could be because she is not eating much her protein levels are low as we have discussed.  Again, I do not think she can take a diuretic, but that is something that would help.  It is just going to further dehydrate her.  If the tingling is in both feet it could be related to the swelling or nutritional deficiency.  We need to figure out what is going on and hopefully the endoscopy will give [place] some answers.

## 2019-03-06 NOTE — Telephone Encounter (Signed)
Pt wanted to know if you think she should go back up on BP meds. She has been getting readings in the rang of 140s over lower 80s. She wanted to see if she should go back to taking 2 benicar a day like she was. She has been eating more normal now.

## 2019-03-06 NOTE — Progress Notes (Signed)
Pt denies SOB and chest pain. Pt stated that she is under the care of Dr. Ena Dawley, Cardiology and Dr. Billey Gosling, PCP. Pt denies having a cardiac cath. Pt denies having a chest x ray in the last year. Pt denies recent labs. Pt stated that last dose of Eliqius was 03/04/19 at [facility]. Pt made aware to stop taking Coenzyme Q10 (COQ10) , vitamins, fish oil and herbal medications. Do not take any NSAIDs ie: Ibuprofen, Advil, Naproxen (Aleve), Motrin, BC and Goody Powder. Pt advised to call PCP, to make aware of the tingling in hands that she has been experiencing " for about a week."  Pt reminded to quarantine. Pt verbalized understanding of all pre-op instructions. PA,  Anesthesiology, asked to review pt history.

## 2019-03-07 ENCOUNTER — Encounter (HOSPITAL_COMMUNITY)
Admission: RE | Disposition: A | Discharge status: Home / Self Care | Payer: Self-pay | Source: Home / Self Care | Attending: Gastroenterology

## 2019-03-07 ENCOUNTER — Other Ambulatory Visit: Payer: Self-pay

## 2019-03-07 ENCOUNTER — Encounter (HOSPITAL_COMMUNITY): Payer: Self-pay | Admitting: Gastroenterology

## 2019-03-07 ENCOUNTER — Ambulatory Visit (HOSPITAL_COMMUNITY): Payer: Medicare Other | Admitting: Vascular Surgery

## 2019-03-07 ENCOUNTER — Ambulatory Visit (HOSPITAL_COMMUNITY)
Admission: RE | Admit: 2019-03-07 | Discharge: 2019-03-07 | Disposition: A | Discharge status: Home / Self Care | Payer: Medicare Other | Attending: Gastroenterology | Admitting: Gastroenterology

## 2019-03-07 DIAGNOSIS — Z881 Allergy status to other antibiotic agents status: Secondary | ICD-10-CM | POA: Diagnosis not present

## 2019-03-07 DIAGNOSIS — R634 Abnormal weight loss: Secondary | ICD-10-CM | POA: Insufficient documentation

## 2019-03-07 DIAGNOSIS — E119 Type 2 diabetes mellitus without complications: Secondary | ICD-10-CM | POA: Insufficient documentation

## 2019-03-07 DIAGNOSIS — K2980 Duodenitis without bleeding: Secondary | ICD-10-CM | POA: Insufficient documentation

## 2019-03-07 DIAGNOSIS — Z79899 Other long term (current) drug therapy: Secondary | ICD-10-CM | POA: Insufficient documentation

## 2019-03-07 DIAGNOSIS — K317 Polyp of stomach and duodenum: Secondary | ICD-10-CM | POA: Insufficient documentation

## 2019-03-07 DIAGNOSIS — R590 Localized enlarged lymph nodes: Secondary | ICD-10-CM | POA: Diagnosis not present

## 2019-03-07 DIAGNOSIS — K529 Noninfective gastroenteritis and colitis, unspecified: Secondary | ICD-10-CM | POA: Diagnosis present

## 2019-03-07 DIAGNOSIS — J45909 Unspecified asthma, uncomplicated: Secondary | ICD-10-CM | POA: Diagnosis not present

## 2019-03-07 DIAGNOSIS — I2510 Atherosclerotic heart disease of native coronary artery without angina pectoris: Secondary | ICD-10-CM | POA: Diagnosis not present

## 2019-03-07 DIAGNOSIS — Z87891 Personal history of nicotine dependence: Secondary | ICD-10-CM | POA: Insufficient documentation

## 2019-03-07 DIAGNOSIS — Z885 Allergy status to narcotic agent status: Secondary | ICD-10-CM | POA: Diagnosis not present

## 2019-03-07 DIAGNOSIS — E785 Hyperlipidemia, unspecified: Secondary | ICD-10-CM | POA: Diagnosis not present

## 2019-03-07 DIAGNOSIS — K8010 Calculus of gallbladder with chronic cholecystitis without obstruction: Secondary | ICD-10-CM | POA: Diagnosis not present

## 2019-03-07 DIAGNOSIS — Z888 Allergy status to other drugs, medicaments and biological substances status: Secondary | ICD-10-CM | POA: Diagnosis not present

## 2019-03-07 DIAGNOSIS — Z7901 Long term (current) use of anticoagulants: Secondary | ICD-10-CM | POA: Diagnosis not present

## 2019-03-07 DIAGNOSIS — Z8249 Family history of ischemic heart disease and other diseases of the circulatory system: Secondary | ICD-10-CM | POA: Insufficient documentation

## 2019-03-07 DIAGNOSIS — D3A8 Other benign neuroendocrine tumors: Secondary | ICD-10-CM

## 2019-03-07 DIAGNOSIS — M1990 Unspecified osteoarthritis, unspecified site: Secondary | ICD-10-CM | POA: Diagnosis not present

## 2019-03-07 DIAGNOSIS — K219 Gastro-esophageal reflux disease without esophagitis: Secondary | ICD-10-CM | POA: Diagnosis not present

## 2019-03-07 DIAGNOSIS — I10 Essential (primary) hypertension: Secondary | ICD-10-CM | POA: Diagnosis not present

## 2019-03-07 DIAGNOSIS — Z8719 Personal history of other diseases of the digestive system: Secondary | ICD-10-CM | POA: Insufficient documentation

## 2019-03-07 DIAGNOSIS — K2950 Unspecified chronic gastritis without bleeding: Secondary | ICD-10-CM | POA: Diagnosis not present

## 2019-03-07 DIAGNOSIS — K8020 Calculus of gallbladder without cholecystitis without obstruction: Secondary | ICD-10-CM

## 2019-03-07 DIAGNOSIS — I4891 Unspecified atrial fibrillation: Secondary | ICD-10-CM | POA: Insufficient documentation

## 2019-03-07 DIAGNOSIS — R935 Abnormal findings on diagnostic imaging of other abdominal regions, including retroperitoneum: Secondary | ICD-10-CM | POA: Diagnosis present

## 2019-03-07 DIAGNOSIS — Z86711 Personal history of pulmonary embolism: Secondary | ICD-10-CM | POA: Insufficient documentation

## 2019-03-07 HISTORY — PX: EUS: SHX5427

## 2019-03-07 HISTORY — PX: SUBMUCOSAL LIFTING INJECTION: SHX6855

## 2019-03-07 HISTORY — PX: ENDOSCOPIC MUCOSAL RESECTION: SHX6839

## 2019-03-07 HISTORY — PX: BIOPSY: SHX5522

## 2019-03-07 HISTORY — PX: ESOPHAGOGASTRODUODENOSCOPY (EGD) WITH PROPOFOL: SHX5813

## 2019-03-07 HISTORY — DX: Paresthesia of skin: R20.2

## 2019-03-07 HISTORY — DX: Other pulmonary embolism without acute cor pulmonale: I26.99

## 2019-03-07 HISTORY — DX: Other benign neuroendocrine tumors: D3A.8

## 2019-03-07 HISTORY — PX: FINE NEEDLE ASPIRATION: SHX5430

## 2019-03-07 HISTORY — PX: HEMOSTASIS CLIP PLACEMENT: SHX6857

## 2019-03-07 HISTORY — DX: Presence of spectacles and contact lenses: Z97.3

## 2019-03-07 SURGERY — UPPER ENDOSCOPIC ULTRASOUND (EUS) RADIAL
Anesthesia: Monitor Anesthesia Care

## 2019-03-07 MED ORDER — SODIUM CHLORIDE 0.9 % IV SOLN: INTRAVENOUS | Status: DC

## 2019-03-07 MED ORDER — SODIUM CHLORIDE BACTERIOSTATIC 0.9 % IJ SOLN
INTRAMUSCULAR | Status: DC | PRN
  Administered 2019-03-07 (×2): 2 mL via INTRAVENOUS

## 2019-03-07 MED ORDER — OMEPRAZOLE 20 MG PO CPDR: 20.0000 mg | DELAYED_RELEASE_CAPSULE | Freq: Two times a day (BID) | ORAL | 0 refills | Status: DC

## 2019-03-07 MED ORDER — PROPOFOL 10 MG/ML IV BOLUS
INTRAVENOUS | Status: DC | PRN
  Administered 2019-03-07: 20 mg via INTRAVENOUS

## 2019-03-07 MED ORDER — LIDOCAINE 2% (20 MG/ML) 5 ML SYRINGE
INTRAMUSCULAR | Status: DC | PRN
  Administered 2019-03-07: 20 mg via INTRAVENOUS

## 2019-03-07 MED ORDER — LACTATED RINGERS IV SOLN: INTRAVENOUS | Status: DC

## 2019-03-07 MED ORDER — PROPOFOL 500 MG/50ML IV EMUL
INTRAVENOUS | Status: DC | PRN
  Administered 2019-03-07: 100 ug/kg/min via INTRAVENOUS

## 2019-03-07 SURGICAL SUPPLY — 14 items

## 2019-03-07 NOTE — Discharge Instructions (Signed)
YOU HAD AN ENDOSCOPIC PROCEDURE TODAY: Refer to the procedure report and other information in the discharge instructions given to you for any specific questions about what was found during the examination. If this information does not answer your questions, please call [place] office at [phone] to clarify.   YOU SHOULD EXPECT: Some feelings of bloating in the abdomen. Passage of more gas than usual. Walking can help get rid of the air that was put into your GI tract during the procedure and reduce the bloating. If you had a lower endoscopy (such as a colonoscopy or flexible sigmoidoscopy) you may notice spotting of blood in your stool or on the toilet paper. Some abdominal soreness may be present for a day or two, also.  DIET: Your first meal following the procedure should be a light meal and then it is ok to progress to your normal diet. A half-sandwich or bowl of soup is an example of a good first meal. Heavy or fried foods are harder to digest and may make you feel nauseous or bloated. Drink plenty of fluids but you should avoid alcoholic beverages for 24 hours. If you had a esophageal dilation, please see attached instructions for diet.    ACTIVITY: Your care partner should take you home directly after the procedure. You should plan to take it easy, moving slowly for the rest of the day. You can resume normal activity the day after the procedure however YOU SHOULD NOT DRIVE, use power tools, machinery or perform tasks that involve climbing or major physical exertion for 24 hours (because of the sedation medicines used during the test).   SYMPTOMS TO REPORT IMMEDIATELY: A gastroenterologist can be reached at any hour. Please call [phone]  for any of the following symptoms:   Following upper endoscopy (EGD, EUS, ERCP, esophageal dilation) Vomiting of blood or coffee ground material  New, significant abdominal pain  New, significant chest pain or pain under the shoulder blades  Painful or  persistently difficult swallowing  New shortness of breath  Black, tarry-looking or red, bloody stools  FOLLOW UP:  If any biopsies were taken you will be contacted by phone or by letter within the next 1-3 weeks. Call [phone]  if you have not heard about the biopsies in 3 weeks.  Please also call with any specific questions about appointments or follow up tests.  

## 2019-03-07 NOTE — Interval H&P Note (Signed)
History and Physical Interval Note:  03/07/2019 11:57 AM  [name]  has presented today for surgery, with the diagnosis of neuroendocrine tumor.  The various methods of treatment have been discussed with the patient and family. After consideration of risks, benefits and other options for treatment, the patient has consented to  Procedure(s): UPPER ENDOSCOPIC ULTRASOUND (EUS) RADIAL (N/A) ESOPHAGOGASTRODUODENOSCOPY (EGD) WITH PROPOFOL (N/A) ENDOSCOPIC MUCOSAL RESECTION (N/A) as a surgical intervention.  The patient's history has been reviewed, patient examined, no change in status, stable for surgery.  I have reviewed the patient's chart and labs.  Questions were answered to the patient's satisfaction.     [employer]

## 2019-03-07 NOTE — Telephone Encounter (Signed)
LVM letting pt know.  

## 2019-03-07 NOTE — Anesthesia Postprocedure Evaluation (Signed)
Anesthesia Post Note  Patient: [name]  Procedure(s) Performed: UPPER ENDOSCOPIC ULTRASOUND (EUS) RADIAL (N/A ) ESOPHAGOGASTRODUODENOSCOPY (EGD) WITH PROPOFOL (N/A ) ENDOSCOPIC MUCOSAL RESECTION (N/A ) BIOPSY FINE NEEDLE ASPIRATION (FNA) LINEAR SUBMUCOSAL LIFTING INJECTION HEMOSTASIS CLIP PLACEMENT     Patient location during evaluation: Endoscopy Anesthesia Type: MAC Level of consciousness: awake and alert Pain management: pain level controlled Vital Signs Assessment: post-procedure vital signs reviewed and stable Respiratory status: spontaneous breathing, nonlabored ventilation and respiratory function stable Cardiovascular status: stable and blood pressure returned to baseline Postop Assessment: no apparent nausea or vomiting Anesthetic complications: no    Last Vitals:  Vitals:   03/07/19 1400 03/07/19 1410  BP: (!) 141/53 (!) 154/96  Pulse: 67 69  Resp: 14 18  Temp:    SpO2: 96% 97%    Last Pain:  Vitals:   03/07/19 1357  TempSrc: Axillary  PainSc: 0-No pain                 Gilman Olazabal,W. EDMOND

## 2019-03-07 NOTE — Transfer of Care (Signed)
Immediate Anesthesia Transfer of Care Note  Patient: [name]  Procedure(s) Performed: UPPER ENDOSCOPIC ULTRASOUND (EUS) RADIAL (N/A ) ESOPHAGOGASTRODUODENOSCOPY (EGD) WITH PROPOFOL (N/A ) ENDOSCOPIC MUCOSAL RESECTION (N/A ) BIOPSY FINE NEEDLE ASPIRATION (FNA) LINEAR SUBMUCOSAL LIFTING INJECTION HEMOSTASIS CLIP PLACEMENT  Patient Location: Endoscopy Unit  Anesthesia Type:MAC  Level of Consciousness: lethargic and responds to stimulation  Airway & Oxygen Therapy: Patient Spontanous Breathing  Post-op Assessment: Report given to RN  Post vital signs: Reviewed and stable  Last Vitals:  Vitals Value Taken Time  BP 139/74 03/07/19 1357  Temp    Pulse 69 03/07/19 1357  Resp 14 03/07/19 1357  SpO2 97 % 03/07/19 1357  Vitals shown include unvalidated device data.  Last Pain:  Vitals:   03/07/19 1023  TempSrc: Temporal  PainSc: 0-No pain         Complications: No apparent anesthesia complications

## 2019-03-07 NOTE — Op Note (Signed)
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 Earlean Shawl, Dr. Lyndel Safe, Dr. Bryan Lemma, Dr. Quay Burow Medicines:                Monitored Anesthesia Care Complications:            No immediate complications. Estimated Blood Loss:     Estimated blood loss was minimal. Procedure:                Pre-Anesthesia Assessment:                           - Prior to the procedure, a History and Physical                            was performed, and patient medications and                            allergies were reviewed. The patient's tolerance of                            previous anesthesia was also reviewed. The risks                            and benefits of the procedure and the sedation                            options and risks were discussed with the patient.                            All questions were answered, and informed consent                            was obtained. Prior Anticoagulants: The patient has                            taken Eliquis (apixaban), last dose was 2 days  prior to procedure. ASA Grade Assessment: III - A                            patient with severe systemic disease. After                            reviewing the risks and benefits, the patient was                            deemed in satisfactory condition to undergo the   procedure.                           After obtaining informed consent, the endoscope was                            passed under direct vision. Throughout the                            procedure, the patient's blood pressure, pulse, and                            oxygen saturations were monitored continuously. The                            [id number]-H190 ([id number]) Olympus gastroscope was                            introduced through the mouth, and advanced to the                            second part of duodenum. The GF-UE160-AL5 ([id number])                            Olympus Radial EUS scope was introduced through the                            mouth, and advanced to the duodenum for ultrasound                            examination from the stomach and duodenum. The                            GF-UTC180 ([id number]) Olympus Linear EUS scope was                            introduced through the mouth, and advanced to the                            duodenum for ultrasound examination from the                            stomach and duodenum. The upper EUS was  accomplished without difficulty. The patient                            tolerated the procedure. Scope In: Scope Out: Findings:      ENDOSCOPIC FINDING: :      No gross lesions were noted in the entire esophagus.      The Z-line was regular and was found 38 cm from the incisors.      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      Four 3 to 6 mm sessile polyps (these looked slightly different than the       fundic gland polypsthan the prior with no bleeding and no stigmata of       recent bleeding were found in the gastric body. The polyps were removed       with a saline injection-lift technique or just using a cold snare.       Resection and retrieval were complete. To  prevent bleeding after the       polypectomy, five hemostatic clips were successfully placed (MR       conditional) (1/1/2/1 on the resection sites). There was no bleeding at       the end of the procedure.      No other gross lesions were noted in the entire examined stomach.       Biopsies were taken with a cold forceps for histology and Helicobacter       pylori testing.      No gross lesions were noted in the duodenal bulb, in the first portion       of the duodenum and in the second portion of the duodenum. Biopsies for       histology were taken with a cold forceps for evaluation of celiac       disease.      ENDOSONOGRAPHIC FINDING: :      A few enlarged lymph nodes were visualized in the perigastric region       with the ultrasound probe located 55 cm from the incisors. The largest       measured 17 mm by 4 mm in maximal cross-sectional diameter. The other       nodes measured 5 mm by 6 mm, 8 mm by 5 mm, 5 mm by 5 mm. The nodes were       oval, hypoechoic and had well defined margins. Fine needle biopsy was       performed. Color Doppler imaging was utilized prior to needle puncture       to confirm a lack of significant vascular structures within the needle       path. Five passes were made with the 22 gauge ultrasound core biopsy       needle using a transgastric approach. A visible core of tissue was       obtained. Preliminary cytologic examination and touch preps were       performed. Final cytology results are pending.      Endosonographic imaging in the duodenal bulb and in the second portion       of the duodenum showed no intramural (subepithelial) lesion or wall       thickening.      There was no sign  of significant endosonographic abnormality in the       common bile duct. The CBD measured 1.8 mm -> 2.7 mm.      Multiple stones and biliary sludge were visualized endosonographically       in the gallbladder. The stones were round. They were hyperechoic and        characterized by shadowing.      Endosonographic imaging in the pancreatic head (PD = 0.9 mm), genu of       the pancreas (PD = 0.5 mm), pancreatic body (PD = 0.4 mm) and pancreatic       tail (PD = 0.3 mm) and showed no mass, parenchymal abnormalities or       stones.      Endosonographic imaging of the ampulla showed no mass.      Endosonographic imaging in the visualized portion of the liver showed no       lesion.      The celiac region was visualized. Impression:               EGD Impression:                           - No gross lesions in esophagus. Z-line regular, 38                            cm from the incisors.                           - Multiple gastric polyps in proximal body                            consistent with fundic gland polyps.                           - Four gastric polyps in the distal body were                            different in appearance. Resected and retrieved.                            Clips (MR conditional) were placed to decrease risk                            of bleeding post-procedure.                           - No other gross lesions in the stomach. Biopsied                            for HP.                           - No gross lesions in the duodenal bulb, in the                            first portion of the duodenum and in the second  portion of the duodenum. Biopsied for enteropathy                            rule out.                           EUS Impression:                           - A few enlarged lymph nodes were visualized in the                            perigastric region. Fine needle biopsy performed.                           - There was no sign of significant pathology in the                            common bile duct.                           - Multiple stones and biliary sludge were                            visualized endosonographically in the gallbladder. Recommendation:           - The  patient will be observed post-procedure,                            until all discharge criteria are met.                           - Discharge patient to home.                           - Patient has a contact number available for                            emergencies. The signs and symptoms of potential                            delayed complications were discussed with the                            patient. Return to normal activities tomorrow.                            Written discharge instructions were provided to the                            patient.                           - Resume previous diet.                           -  Observe patient's clinical course.                           - Await cytology results and await path results.                           - Return to referring physician.                           - Increase PPI to 20 mg twice daily for 64-monthand                            then decrease back to 20 mg daily (Rx sent to                            pharmacy).                           - Eliquis to be restarted on 1/23 PM (48 hours                            after completion of procedure).                           - The findings and recommendations were discussed                            with the patient.                           - The findings and recommendations were discussed                            with the patient's family. Procedure Code(s):        --- Professional ---                           [id number] Esophagogastroduodenoscopy, flexible,                            transoral; with transendoscopic ultrasound-guided                            intramural or transmural fine needle                            aspiration/biopsy(s), (includes endoscopic                            ultrasound examination limited to the esophagus,                            stomach or duodenum, and adjacent structures)                           43251,  Esophagogastroduodenoscopy, flexible,  transoral; with removal of tumor(s), polyp(s), or                            other lesion(s) by snare technique Diagnosis Code(s):        --- Professional ---                           K31.7, Polyp of stomach and duodenum                           R59.0, Localized enlarged lymph nodes                           K80.20, Calculus of gallbladder without                            cholecystitis without obstruction                           R93.5, Abnormal findings on diagnostic imaging of                            other abdominal regions, including retroperitoneum CPT copyright 2019 American Medical Association. All rights reserved. The codes documented in this report are preliminary and upon coder review may  be revised to meet current compliance requirements. Justice Britain, MD 03/07/2019 2:33:51 PM Number of Addenda: 0

## 2019-03-07 NOTE — Anesthesia Procedure Notes (Signed)
Procedure Name: MAC Date/Time: 03/07/2019 12:00 PM Performed by: Barrington Ellison, CRNA Pre-anesthesia Checklist: Patient identified, Emergency Drugs available, Suction available, Patient being monitored and Timeout performed Patient Re-evaluated:Patient Re-evaluated prior to induction Oxygen Delivery Method: Nasal cannula

## 2019-03-08 ENCOUNTER — Other Ambulatory Visit: Payer: Self-pay | Admitting: Physician Assistant

## 2019-03-08 LAB — CYTOLOGY - NON PAP

## 2019-03-11 ENCOUNTER — Encounter: Payer: Self-pay | Admitting: Gastroenterology

## 2019-03-11 LAB — SURGICAL PATHOLOGY

## 2019-03-12 ENCOUNTER — Other Ambulatory Visit: Payer: Self-pay

## 2019-03-12 MED ORDER — OMEPRAZOLE 40 MG PO CPDR: 40.0000 mg | DELAYED_RELEASE_CAPSULE | Freq: Every day | ORAL | 0 refills | Status: DC

## 2019-03-12 MED ORDER — AMOXICILLIN 500 MG PO TABS: 1000.0000 mg | ORAL_TABLET | Freq: Two times a day (BID) | ORAL | 0 refills | Status: DC

## 2019-03-12 MED ORDER — LEVOFLOXACIN 250 MG PO TABS: 250.0000 mg | ORAL_TABLET | Freq: Two times a day (BID) | ORAL | 0 refills | Status: DC

## 2019-03-19 ENCOUNTER — Encounter: Payer: Self-pay | Admitting: Internal Medicine

## 2019-03-19 ENCOUNTER — Other Ambulatory Visit: Payer: Self-pay

## 2019-03-19 ENCOUNTER — Ambulatory Visit (INDEPENDENT_AMBULATORY_CARE_PROVIDER_SITE_OTHER): Payer: Medicare Other | Admitting: Internal Medicine

## 2019-03-19 VITALS — BP 172/100 | HR 66 | Temp 98.4°F | Resp 16 | Ht 61.0 in | Wt 111.4 lb

## 2019-03-19 DIAGNOSIS — E538 Deficiency of other specified B group vitamins: Secondary | ICD-10-CM | POA: Insufficient documentation

## 2019-03-19 DIAGNOSIS — R600 Localized edema: Secondary | ICD-10-CM | POA: Diagnosis not present

## 2019-03-19 DIAGNOSIS — K649 Unspecified hemorrhoids: Secondary | ICD-10-CM | POA: Diagnosis not present

## 2019-03-19 DIAGNOSIS — I10 Essential (primary) hypertension: Secondary | ICD-10-CM | POA: Diagnosis not present

## 2019-03-19 DIAGNOSIS — I720 Aneurysm of carotid artery: Secondary | ICD-10-CM

## 2019-03-19 DIAGNOSIS — I2782 Chronic pulmonary embolism: Secondary | ICD-10-CM

## 2019-03-19 MED ORDER — LISINOPRIL 10 MG PO TABS: 10.0000 mg | ORAL_TABLET | Freq: Every day | ORAL | 5 refills | Status: DC

## 2019-03-19 MED ORDER — CYANOCOBALAMIN 1000 MCG/ML IJ SOLN
1000.0000 ug | Freq: Once | INTRAMUSCULAR | Status: AC
  Administered 2019-03-19: 1000 ug via INTRAMUSCULAR

## 2019-03-19 MED ORDER — HYDROCORTISONE 2.5 % EX CREA: TOPICAL_CREAM | Freq: Two times a day (BID) | CUTANEOUS | 5 refills | Status: DC

## 2019-03-19 MED ORDER — DIPHENOXYLATE-ATROPINE 2.5-0.025 MG PO TABS: 1.0000 | ORAL_TABLET | Freq: Three times a day (TID) | ORAL | 1 refills | Status: DC | PRN

## 2019-03-19 MED ORDER — NEBIVOLOL HCL 2.5 MG PO TABS: ORAL_TABLET | ORAL | 5 refills | Status: DC

## 2019-03-19 NOTE — Progress Notes (Signed)
Subjective:    Patient ID: [name], female    DOB: 1946-02-08, 74 y.o.   MRN: [id number]  HPI The patient is here for an acute visit for leg swelling and elevated BP.   She saw Dr Earlean Shawl on 1/29 and he advised stopping the Benicar - it may be causing a celiac like syndrome.  She did not take it this morning.  Over the past few days she has taken some days but not other days.  The past couple of days she has taken the Bystolic because she has had some of those leftover.  She has had a headache for the past few days and wonders if it is related to her elevated blood pressure.   Tingling in her hands x 2 weeks  BP cuff on left arm - left hand craws up - contracts  Joint pain - has rheum appt. sometimes her joints are red and inflamed.  Hemorrhoids since diarrhea restarted.  - soaking in bathtub, vaseline.   Hair is fallling out.  Has lump in right anterior lower neck - she noticed it a few days ago.  She denies any pain.   Legs muscle weak.  He has not seen improvement since being out of the hospital.  She gets her protein from cottage cheese and yogurt.  She eats minimal meat.    Her diarrhea was getting better when she is taking the Creon religiously and eating small frequent meals.  After trying the antibiotics for her H. pylori infection the diarrhea again worsened and she had to stop the medication..  Her stools have improved slightly since stopping antibiotics.  She is still having frequent, very soft/watery stools.  Medications and allergies reviewed with patient and updated if appropriate.  Patient Active Problem List   Diagnosis Date Noted  . Bilateral leg edema 03/19/2019  . Abnormal CT of the abdomen 02/27/2019  . Abnormal endoscopy of upper gastrointestinal tract 02/27/2019  . Neuroendocrine tumor 02/27/2019  . Iron deficiency anemia 02/19/2019  . Protein-calorie malnutrition, severe 01/31/2019  . Hypomagnesemia   . AKI (acute kidney injury) ([place])  01/30/2019  . Hyponatremia 01/04/2019  . Inflammatory arthritis 01/02/2019  . Positive ANA (antinuclear antibody) 01/02/2019  . Exocrine pancreatic insufficiency 10/23/2018  . Multiple pulmonary nodules determined by computed tomography of lung 09/18/2018  . Decreased GFR 08/21/2018  . Nausea vomiting and diarrhea 08/20/2018  . Syncope 08/01/2018  . Hypokalemia 08/01/2018  . Abnormal CT scan, pelvis 07/31/2018  . Pneumothorax 03/14/2018  . Lung abnormality 01/11/2018  . Pulmonary embolism ([place]) 12/24/2017  . Lung mass 12/24/2017  . Small vessel disease, cerebrovascular 07/31/2017  . Atypical chest pain 06/14/2017  . Dizziness 06/14/2017  . Hyperuricemia 05/03/2017  . Arthralgia 05/01/2017  . Hair loss 05/01/2017  . Vitamin D deficiency 10/31/2016  . Diarrhea 06/06/2016  . Diastolic dysfunction 85/63/1497  . Vertigo 12/24/2015  . Nonspecific abnormal electrocardiogram (ECG) (EKG) 12/24/2015  . Bilateral carotid artery disease, Mild 05/31/2015  . Thyroid nodule 05/31/2015  . Psoriasis   . Scoliosis   . Diabetes type 2, controlled ([place]) 06/03/2008  . Dyslipidemia 06/03/2008  . CARPAL TUNNEL SYNDROME 06/03/2008  . HTN (hypertension) 06/03/2008  . ALLERGIC RHINITIS 06/03/2008  . Asthma 06/03/2008  . GERD (gastroesophageal reflux disease) 06/03/2008  . Osteopenia 06/03/2008  . COLONIC POLYPS, HX OF 06/03/2008    Current Outpatient Medications on File Prior to Visit  Medication Sig Dispense Refill  . apixaban (ELIQUIS) 5 MG TABS tablet Take 1  tablet (5 mg total) by mouth 2 (two) times daily. 60 tablet 5  . Ascorbic Acid (VITAMIN C GUMMIE PO) Take 1 tablet by mouth every evening.    . Cholecalciferol (VITAMIN D3 GUMMIES ADULT PO) Take 1 tablet by mouth every evening.    . clindamycin (CLEOCIN T) 1 % external solution Apply 1 application topically daily as needed (wounds).     . clobetasol (TEMOVATE) 0.05 % external solution Apply 1 application topically every morning.     .  Coenzyme Q10 (COQ10 GUMMIES ADULT PO) Take 1 tablet by mouth every evening.    Marland Kitchen CREON 12000 units CPEP capsule Take 12,000 Units by mouth 3 (three) times daily with meals.     . diphenoxylate-atropine (LOMOTIL) 2.5-0.025 MG tablet Take 1 tablet by mouth 3 (three) times daily. (Patient taking differently: Take 1 tablet by mouth 4 (four) times daily as needed for diarrhea or loose stools. ) 90 tablet 1  . halobetasol (ULTRAVATE) 0.05 % cream Apply 1 application topically daily. After bathing    . loperamide (IMODIUM) 2 MG capsule Take 1 capsule (2 mg total) by mouth 2 (two) times daily. (Patient taking differently: Take 2 mg by mouth 3 (three) times daily as needed for diarrhea or loose stools. ) 30 capsule 0  . loratadine (CLARITIN) 10 MG tablet Take 10 mg by mouth daily as needed for allergies.    . Multiple Vitamins-Minerals (ADULT GUMMY PO) Take 1 tablet by mouth every evening.    Marland Kitchen omeprazole (PRILOSEC) 20 MG capsule Take 1 capsule (20 mg total) by mouth 2 (two) times daily before a meal. 60 capsule 0  . omeprazole (PRILOSEC) 40 MG capsule Take 1 capsule (40 mg total) by mouth daily for 14 days. 14 capsule 0  . ondansetron (ZOFRAN ODT) 8 MG disintegrating tablet Take 1 tablet (8 mg total) by mouth every 8 (eight) hours as needed for nausea or vomiting. (Patient taking differently: Take 8 mg by mouth every 6 (six) hours. ) 60 tablet 0  . ONETOUCH ULTRA test strip USE TEST STRIPS TO CHECK BLOOD SUGAR AT LEAST 3 TIMES DAILY 300 strip 1  . oxymetazoline (AFRIN) 0.05 % nasal spray Place 1 spray into both nostrils 2 (two) times daily.    Vladimir Faster Glycol-Propyl Glycol (LUBRICANT EYE DROPS) 0.4-0.3 % SOLN Place 1 drop into both eyes daily as needed (dry/irritated eyes.).    Marland Kitchen BENICAR 20 MG tablet Take one tablet daily 30 tablet 5   No current facility-administered medications on file prior to visit.    Past Medical History:  Diagnosis Date  . ALLERGIC RHINITIS   . ANEMIA-NOS   . Arthritis   .  ASTHMA   . Carpal tunnel syndrome   . COLONIC POLYPS, HX OF   . Coronary artery disease    "mild CAD" noted on 12/05/17 in coronary CT scan  . DIABETES MELLITUS, TYPE II    diet controlled  . GERD   . Hand tingling   . HYPERLIPIDEMIA   . HYPERTENSION   . Neuroendocrine tumor   . OSTEOPENIA   . PONV (postoperative nausea and vomiting)    " it relaxes my bladder muscles and I have been known to pee all over the place"  . Psoriasis    severe, began soriatane 01/2012  . Pulmonary embolism ([place])   . Rectal fissure   . Scoliosis   . Wears glasses     Past Surgical History:  Procedure Laterality Date  . benign rectal growth  2004   removed by Dr. Zella Richer  . BIOPSY  08/09/2018   Procedure: BIOPSY;  Surgeon: Carol Ada, MD;  Location: [place];  Service: Endoscopy;;  . BIOPSY  08/22/2018   Procedure: BIOPSY;  Surgeon: Juanita Craver, MD;  Location: [place];  Service: Endoscopy;;  . BIOPSY  03/07/2019   Procedure: BIOPSY;  Surgeon: Irving Copas., MD;  Location: [place];  Service: Gastroenterology;;  . BREAST BIOPSY Right   . BREAST EXCISIONAL BIOPSY Left   . BREAST SURGERY  1988   biopsy  . CESAREAN SECTION    . COLONOSCOPY N/A 08/22/2018   Procedure: COLONOSCOPY;  Surgeon: Juanita Craver, MD;  Location: [facility] ENDOSCOPY;  Service: Endoscopy;  Laterality: N/A;  . ENDOSCOPIC MUCOSAL RESECTION N/A 03/07/2019   Procedure: ENDOSCOPIC MUCOSAL RESECTION;  Surgeon: Rush Landmark Telford Nab., MD;  Location: [place];  Service: Gastroenterology;  Laterality: N/A;  . ESOPHAGOGASTRODUODENOSCOPY (EGD) WITH PROPOFOL N/A 08/04/2018   Procedure: ESOPHAGOGASTRODUODENOSCOPY (EGD) WITH PROPOFOL;  Surgeon: Ladene Artist, MD;  Location: [place];  Service: Gastroenterology;  Laterality: N/A;  . ESOPHAGOGASTRODUODENOSCOPY (EGD) WITH PROPOFOL N/A 03/07/2019   Procedure: ESOPHAGOGASTRODUODENOSCOPY (EGD) WITH PROPOFOL;  Surgeon: Rush Landmark Telford Nab., MD;  Location: [place];   Service: Gastroenterology;  Laterality: N/A;  . EUS N/A 03/07/2019   Procedure: UPPER ENDOSCOPIC ULTRASOUND (EUS) RADIAL;  Surgeon: Rush Landmark Telford Nab., MD;  Location: [place];  Service: Gastroenterology;  Laterality: N/A;  . FINE NEEDLE ASPIRATION  03/07/2019   Procedure: FINE NEEDLE ASPIRATION (FNA) LINEAR;  Surgeon: Irving Copas., MD;  Location: [place];  Service: Gastroenterology;;  . Otho Darner SIGMOIDOSCOPY N/A 08/04/2018   Procedure: FLEXIBLE SIGMOIDOSCOPY;  Surgeon: Ladene Artist, MD;  Location: [place];  Service: Gastroenterology;  Laterality: N/A;  . FLEXIBLE SIGMOIDOSCOPY N/A 08/09/2018   Procedure: FLEXIBLE SIGMOIDOSCOPY;  Surgeon: Carol Ada, MD;  Location: [place];  Service: Endoscopy;  Laterality: N/A;  . HEMOSTASIS CLIP PLACEMENT  03/07/2019   Procedure: HEMOSTASIS CLIP PLACEMENT;  Surgeon: Irving Copas., MD;  Location: [place];  Service: Gastroenterology;;  . Lia Foyer LIFTING INJECTION  03/07/2019   Procedure: SUBMUCOSAL LIFTING INJECTION;  Surgeon: Irving Copas., MD;  Location: [place];  Service: Gastroenterology;;  . VIDEO BRONCHOSCOPY WITH ENDOBRONCHIAL NAVIGATION N/A 03/14/2018   Procedure: VIDEO BRONCHOSCOPY WITH ENDOBRONCHIAL NAVIGATION;  Surgeon: Collene Gobble, MD;  Location[facility];  Service: Thoracic;  Laterality: N/A;    Social History   Socioeconomic History  . Marital status: Single    Spouse name: Not on file  . Number of children: 1  . Years of education: 91  . Highest education level: Bachelor's degree (e.g., BA, AB, BS)  Occupational History  . Occupation: retired Careers adviser  Tobacco Use  . Smoking status: Former Smoker    Packs/day: 1.00    Years: 10.00    Pack years: 10.00    Quit date: 02/14/1974    Years since quitting: 45.1  . Smokeless tobacco: Never Used  Substance and Sexual Activity  . Alcohol use: No  . Drug use: No  . Sexual activity: Not on  file  Other Topics Concern  . Not on file  Social History Narrative   Lives alone in a one story home.  Has one child and one grandchild.  Retired Careers adviser.  Education: college.    Social Determinants of Health   Financial Resource Strain:   . Difficulty of Paying Living Expenses: Not on file  Food  Insecurity:   . Worried About Charity fundraiser in the Last Year: Not on file  . Ran Out of Food in the Last Year: Not on file  Transportation Needs:   . Lack of Transportation (Medical): Not on file  . Lack of Transportation (Non-Medical): Not on file  Physical Activity:   . Days of Exercise per Week: Not on file  . Minutes of Exercise per Session: Not on file  Stress:   . Feeling of Stress : Not on file  Social Connections:   . Frequency of Communication with Friends and Family: Not on file  . Frequency of Social Gatherings with Friends and Family: Not on file  . Attends Religious Services: Not on file  . Active Member of Clubs or Organizations: Not on file  . Attends Archivist Meetings: Not on file  . Marital Status: Not on file    Family History  Problem Relation Age of Onset  . Lung cancer Father   . Arthritis Other        Parents  . Asthma Other        parent, other relative  . Breast cancer Other        other relative  . Hypertension Other        parent, other relative  . Heart disease Other        parent, other relative  . Heart disease Mother   . Asthma Mother   . Breast cancer Maternal Aunt 68  . Parkinson's disease Maternal Grandmother   . Rheumatic fever Maternal Grandfather     Review of Systems  Constitutional: Negative for fever.  Respiratory: Negative for cough, shortness of breath and wheezing.   Cardiovascular: Positive for leg swelling. Negative for chest pain and palpitations.  Neurological: Positive for headaches. Negative for light-headedness.       Last few days - has been off balanced        Objective:   Vitals:   03/19/19 0951  BP: (!) 172/100  Pulse: 66  Resp: 16  Temp: 98.4 F (36.9 C)  SpO2: 99%   BP Readings from Last 3 Encounters:  03/19/19 (!) 172/100  03/07/19 (!) 167/71  02/26/19 124/77   Wt Readings from Last 3 Encounters:  03/19/19 111 lb 6.4 oz (50.5 kg)  02/26/19 110 lb (49.9 kg)  02/19/19 110 lb 6.4 oz (50.1 kg)   Body mass index is 21.05 kg/m.   Physical Exam    Constitutional: Chronically ill-appearing. No distress.  Head: Normocephalic and atraumatic.  Neck: Neck supple. No tracheal deviation present. No thyromegaly present.  No cervical lymphadenopathy.  Right lower neck there is a palpable lump that is pulsatile and appears to be an artery-possible aneurysm Cardiovascular: Normal rate, regular rhythm and normal heart sounds.  No murmur heard. No carotid bruit .  1+ bilateral lower extremity pitting edema-left leg slightly worse than right Pulmonary/Chest: Effort normal and breath sounds normal. No respiratory distress. No has no wheezes. No rales. Abdomen: Soft, very distended, mild diffuse tenderness without rebound or guarding Skin: Skin is warm and dry. Not diaphoretic.  Psychiatric: Normal mood and affect. Behavior is normal.       Assessment & Plan:    See Problem List for Assessment and Plan of chronic medical problems.    This visit occurred during the SARS-CoV-2 public health emergency.  Safety protocols were in place, including screening questions prior to the visit, additional usage of staff PPE, and extensive cleaning of exam  room while observing appropriate contact time as indicated for disinfecting solutions.

## 2019-03-19 NOTE — Assessment & Plan Note (Signed)
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 [name] had ordered lower extremity ultrasounds to see if she had a DVT, which could make this a provoked event and may change the need for lifelong anticoagulation-she did not have this done at the time because she went into the hospital We will order at this time

## 2019-03-19 NOTE — Assessment & Plan Note (Signed)
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

## 2019-03-19 NOTE — Assessment & Plan Note (Signed)
Bilateral lower extremity edema-1+ pitting Encouraged her to eat as much protein as possible She has been elevating and did try compression socks-these measures helped initially, but are no longer helping She did not take the Lasix previously because of recurrent dehydration, severe diarrhea and decreased kidney function We will see how her diarrhea is in a week and if it is improved we may need to do short course of furosemide

## 2019-03-19 NOTE — Assessment & Plan Note (Signed)
New Related to diarrhea, regular bowel movements Hydrocortisone 2.5% twice daily, soaking in the tub, can try Preparation H wipes Hopefully we can get diarrhea controlled, which is what will ultimately help

## 2019-03-19 NOTE — Assessment & Plan Note (Addendum)
B12 level low and recent blood work done by Dr. Earlean Shawl- due to malabsorption/diarrhea Start B12 injections - first today, will repeat in 1 week Discussed that this could be the potential cause of her tingling in her hands

## 2019-03-19 NOTE — Addendum Note (Signed)
Addended by: Delice Bison E on: 03/19/2019 01:34 PM   Modules accepted: Orders

## 2019-03-19 NOTE — Patient Instructions (Addendum)
Stop benicar,  Start lisinopril 10 mg  Continue bystolic 2.5 mg daily   Hydrocortisone 2.5 % for the hemorrhoids.  Try preparation H wipes    An ultrasound of your carotid arteries was ordered.    An ultrasound of your legs was ordered.    You received a B12 injection today

## 2019-03-20 ENCOUNTER — Ambulatory Visit (HOSPITAL_BASED_OUTPATIENT_CLINIC_OR_DEPARTMENT_OTHER)
Admission: RE | Admit: 2019-03-20 | Discharge: 2019-03-20 | Disposition: A | Discharge status: Home / Self Care | Payer: Medicare Other | Source: Ambulatory Visit | Attending: Internal Medicine | Admitting: Internal Medicine

## 2019-03-20 ENCOUNTER — Ambulatory Visit (HOSPITAL_COMMUNITY)
Admission: RE | Admit: 2019-03-20 | Discharge: 2019-03-20 | Disposition: A | Discharge status: Home / Self Care | Payer: Medicare Other | Source: Ambulatory Visit | Attending: Cardiology | Admitting: Cardiology

## 2019-03-20 DIAGNOSIS — I2782 Chronic pulmonary embolism: Secondary | ICD-10-CM | POA: Diagnosis present

## 2019-03-20 DIAGNOSIS — R600 Localized edema: Secondary | ICD-10-CM | POA: Diagnosis present

## 2019-03-20 DIAGNOSIS — I720 Aneurysm of carotid artery: Secondary | ICD-10-CM

## 2019-03-25 NOTE — Progress Notes (Signed)
Subjective:    Patient ID: [name], female    DOB: [birth year], 74 y.o.   MRN: [id number]  HPI The patient is here for follow up of their chronic medical problems, including hypertension.  We stopped Benicar last week and started lisinopril and continued bystolic.  Her BP at home  Low 130's / low 80's.  It always tends to be higher here.  She does feel like it is controlled.  Her leg [place] and carotid US were normal.   B12 def:  Had an injection #1 last week.  She denies any change in energy.  She is still experiencing tingling in her hands and feet.  Lower extremity swelling: She still has the swelling bilaterally in her lower legs.  She does have some good days and will not have as much swelling.  She tries to elevate the legs.  Chronic nausea: She still has nausea. She is taking the zofran.  She has not vomited in the past week.  She is eating a little more, but not eating as healthy as she knows she should.  She is drinking water all day.  Diarrhea, pancreatic insufficiency: She is taking the Creon as prescribed.  She has 1-3 BM a day.  They are not water.  They are formed, soft and sometimes float.  She still has abdominal bloating.    Taking vitamin d, coq10, MVI, vitamin c   Medications and allergies reviewed with patient and updated if appropriate.  Patient Active Problem List   Diagnosis Date Noted  . Bilateral leg edema 03/19/2019  . Hemorrhoids 03/19/2019  . B12 deficiency 03/19/2019  . Abnormal CT of the abdomen 02/27/2019  . Abnormal endoscopy of upper gastrointestinal tract 02/27/2019  . Neuroendocrine tumor 02/27/2019  . Iron deficiency anemia 02/19/2019  . Protein-calorie malnutrition, severe 01/31/2019  . Hypomagnesemia   . AKI (acute kidney injury) ([place]) 01/30/2019  . Hyponatremia 01/04/2019  . Inflammatory arthritis 01/02/2019  . Positive ANA (antinuclear antibody) 01/02/2019  . Exocrine pancreatic insufficiency 10/23/2018  . Multiple pulmonary  nodules determined by computed tomography of lung 09/18/2018  . Decreased GFR 08/21/2018  . Nausea vomiting and diarrhea 08/20/2018  . Syncope 08/01/2018  . Hypokalemia 08/01/2018  . Abnormal CT scan, pelvis 07/31/2018  . Pneumothorax 03/14/2018  . Lung abnormality 01/11/2018  . Pulmonary embolism ([place]) 12/24/2017  . Lung mass 12/24/2017  . Small vessel disease, cerebrovascular 07/31/2017  . Atypical chest pain 06/14/2017  . Dizziness 06/14/2017  . Hyperuricemia 05/03/2017  . Arthralgia 05/01/2017  . Hair loss 05/01/2017  . Vitamin D deficiency 10/31/2016  . Diarrhea 06/06/2016  . Diastolic dysfunction 73/41/9379  . Vertigo 12/24/2015  . Nonspecific abnormal electrocardiogram (ECG) (EKG) 12/24/2015  . Bilateral carotid artery disease, Mild 05/31/2015  . Thyroid nodule 05/31/2015  . Psoriasis   . Scoliosis   . Diabetes type 2, controlled ([place]) 06/03/2008  . Dyslipidemia 06/03/2008  . CARPAL TUNNEL SYNDROME 06/03/2008  . HTN (hypertension) 06/03/2008  . ALLERGIC RHINITIS 06/03/2008  . Asthma 06/03/2008  . GERD (gastroesophageal reflux disease) 06/03/2008  . Osteopenia 06/03/2008  . COLONIC POLYPS, HX OF 06/03/2008    Current Outpatient Medications on File Prior to Visit  Medication Sig Dispense Refill  . apixaban (ELIQUIS) 5 MG TABS tablet Take 1 tablet (5 mg total) by mouth 2 (two) times daily. 60 tablet 5  . Ascorbic Acid (VITAMIN C GUMMIE PO) Take 1 tablet by mouth every evening.    . Cholecalciferol (VITAMIN D3 GUMMIES ADULT PO)  Take 1 tablet by mouth every evening.    . clindamycin (CLEOCIN T) 1 % external solution Apply 1 application topically daily as needed (wounds).     . clobetasol (TEMOVATE) 0.05 % external solution Apply 1 application topically every morning.     . Coenzyme Q10 (COQ10 GUMMIES ADULT PO) Take 1 tablet by mouth every evening.    Marland Kitchen CREON 12000 units CPEP capsule Take 12,000 Units by mouth 3 (three) times daily with meals.     .  diphenoxylate-atropine (LOMOTIL) 2.5-0.025 MG tablet Take 1 tablet by mouth 3 (three) times daily as needed for diarrhea or loose stools. 90 tablet 1  . halobetasol (ULTRAVATE) 0.05 % cream Apply 1 application topically daily. After bathing    . hydrocortisone 2.5 % cream Apply topically 2 (two) times daily. 30 g 5  . lisinopril (ZESTRIL) 10 MG tablet Take 1 tablet (10 mg total) by mouth daily. 30 tablet 5  . loperamide (IMODIUM) 2 MG capsule Take 1 capsule (2 mg total) by mouth 2 (two) times daily. (Patient taking differently: Take 2 mg by mouth 3 (three) times daily as needed for diarrhea or loose stools. ) 30 capsule 0  . loratadine (CLARITIN) 10 MG tablet Take 10 mg by mouth daily as needed for allergies.    . Multiple Vitamins-Minerals (ADULT GUMMY PO) Take 1 tablet by mouth every evening.    . nebivolol (BYSTOLIC) 2.5 MG tablet TAKE ONE (1) TABLET BY MOUTH EVERY DAY 30 tablet 5  . omeprazole (PRILOSEC) 20 MG capsule Take 1 capsule (20 mg total) by mouth 2 (two) times daily before a meal. 60 capsule 0  . omeprazole (PRILOSEC) 40 MG capsule Take 1 capsule (40 mg total) by mouth daily for 14 days. 14 capsule 0  . ondansetron (ZOFRAN ODT) 8 MG disintegrating tablet Take 1 tablet (8 mg total) by mouth every 8 (eight) hours as needed for nausea or vomiting. (Patient taking differently: Take 8 mg by mouth every 6 (six) hours. ) 60 tablet 0  . ONETOUCH ULTRA test strip USE TEST STRIPS TO CHECK BLOOD SUGAR AT LEAST 3 TIMES DAILY 300 strip 1  . oxymetazoline (AFRIN) 0.05 % nasal spray Place 1 spray into both nostrils 2 (two) times daily.    Vladimir Faster Glycol-Propyl Glycol (LUBRICANT EYE DROPS) 0.4-0.3 % SOLN Place 1 drop into both eyes daily as needed (dry/irritated eyes.).     No current facility-administered medications on file prior to visit.    Past Medical History:  Diagnosis Date  . ALLERGIC RHINITIS   . ANEMIA-NOS   . Arthritis   . ASTHMA   . Carpal tunnel syndrome   . COLONIC POLYPS, HX  OF   . Coronary artery disease    "mild CAD" noted on 12/05/17 in coronary CT scan  . DIABETES MELLITUS, TYPE II    diet controlled  . GERD   . Hand tingling   . HYPERLIPIDEMIA   . HYPERTENSION   . Neuroendocrine tumor   . OSTEOPENIA   . PONV (postoperative nausea and vomiting)    " it relaxes my bladder muscles and I have been known to pee all over the place"  . Psoriasis    severe, began soriatane 01/2012  . Pulmonary embolism ([place])   . Rectal fissure   . Scoliosis   . Wears glasses     Past Surgical History:  Procedure Laterality Date  . benign rectal growth  2004   removed by Dr. Zella Richer  . BIOPSY  08/09/2018   Procedure: BIOPSY;  Surgeon: Carol Ada, MD;  Location: [facility] [facility];  Service: Endoscopy;;  . BIOPSY  08/22/2018   Procedure: BIOPSY;  Surgeon: Juanita Craver, MD;  Location: [facility];  Service: Endoscopy;;  . BIOPSY  03/07/2019   Procedure: BIOPSY;  Surgeon: Irving Copas., MD;  Location: [facility];  Service: Gastroenterology;;  . BREAST BIOPSY Right   . BREAST EXCISIONAL BIOPSY Left   . BREAST SURGERY  1988   biopsy  . CESAREAN SECTION    . COLONOSCOPY N/A 08/22/2018   Procedure: COLONOSCOPY;  Surgeon: Juanita Craver, MD;  Location: [facility] ENDOSCOPY;  Service: Endoscopy;  Laterality: N/A;  . ENDOSCOPIC MUCOSAL RESECTION N/A 03/07/2019   Procedure: ENDOSCOPIC MUCOSAL RESECTION;  Surgeon: Rush Landmark Telford Nab., MD;  Location: [facility];  Service: Gastroenterology;  Laterality: N/A;  . ESOPHAGOGASTRODUODENOSCOPY (EGD) WITH PROPOFOL N/A 08/04/2018   Procedure: ESOPHAGOGASTRODUODENOSCOPY (EGD) WITH PROPOFOL;  Surgeon: Ladene Artist, MD;  Location: [place];  Service: Gastroenterology;  Laterality: N/A;  . ESOPHAGOGASTRODUODENOSCOPY (EGD) WITH PROPOFOL N/A 03/07/2019   Procedure: ESOPHAGOGASTRODUODENOSCOPY (EGD) WITH PROPOFOL;  Surgeon: Rush Landmark Telford Nab., MD;  Location: [place];  Service: Gastroenterology;  Laterality: N/A;  . EUS N/A  03/07/2019   Procedure: UPPER ENDOSCOPIC ULTRASOUND (EUS) RADIAL;  Surgeon: Rush Landmark Telford Nab., MD;  Location: [place];  Service: Gastroenterology;  Laterality: N/A;  . FINE NEEDLE ASPIRATION  03/07/2019   Procedure: FINE NEEDLE ASPIRATION (FNA) LINEAR;  Surgeon: Irving Copas., MD;  Location: [place];  Service: Gastroenterology;;  . Otho Darner SIGMOIDOSCOPY N/A 08/04/2018   Procedure: FLEXIBLE SIGMOIDOSCOPY;  Surgeon: Ladene Artist, MD;  Location: [place];  Service: Gastroenterology;  Laterality: N/A;  . FLEXIBLE SIGMOIDOSCOPY N/A 08/09/2018   Procedure: FLEXIBLE SIGMOIDOSCOPY;  Surgeon: Carol Ada, MD;  Location: [place];  Service: Endoscopy;  Laterality: N/A;  . HEMOSTASIS CLIP PLACEMENT  03/07/2019   Procedure: HEMOSTASIS CLIP PLACEMENT;  Surgeon: Irving Copas., MD;  Location: [place];  Service: Gastroenterology;;  . Lia Foyer LIFTING INJECTION  03/07/2019   Procedure: SUBMUCOSAL LIFTING INJECTION;  Surgeon: Irving Copas., MD;  Location: [facility];  Service: Gastroenterology;;  . VIDEO BRONCHOSCOPY WITH ENDOBRONCHIAL NAVIGATION N/A 03/14/2018   Procedure: VIDEO BRONCHOSCOPY WITH ENDOBRONCHIAL NAVIGATION;  Surgeon: Collene Gobble, MD;  Location[facility];  Service: Thoracic;  Laterality: N/A;    Social History   Socioeconomic History  . Marital status: Single    Spouse name: Not on file  . Number of children: 1  . Years of education: 52  . Highest education level: Bachelor's degree (e.g., BA, AB, BS)  Occupational History  . Occupation: retired Careers adviser  Tobacco Use  . Smoking status: Former Smoker    Packs/day: 1.00    Years: 10.00    Pack years: 10.00    Quit date: 02/14/1974    Years since quitting: 45.1  . Smokeless tobacco: Never Used  Substance and Sexual Activity  . Alcohol use: No  . Drug use: No  . Sexual activity: Not on file  Other Topics Concern  . Not on file  Social  History Narrative   Lives alone in a one story home.  Has one child and one grandchild.  Retired Careers adviser.  Education: college.    Social Determinants of Health   Financial Resource Strain:   . Difficulty of Paying Living Expenses: Not on file  Food Insecurity:   . Worried About Charity fundraiser in  the Last Year: Not on file  . Ran Out of Food in the Last Year: Not on file  Transportation Needs:   . Lack of Transportation (Medical): Not on file  . Lack of Transportation (Non-Medical): Not on file  Physical Activity:   . Days of Exercise per Week: Not on file  . Minutes of Exercise per Session: Not on file  Stress:   . Feeling of Stress : Not on file  Social Connections:   . Frequency of Communication with Friends and Family: Not on file  . Frequency of Social Gatherings with Friends and Family: Not on file  . Attends Religious Services: Not on file  . Active Member of Clubs or Organizations: Not on file  . Attends Archivist Meetings: Not on file  . Marital Status: Not on file    Family History  Problem Relation Age of Onset  . Lung cancer Father   . Arthritis Other        Parents  . Asthma Other        parent, other relative  . Breast cancer Other        other relative  . Hypertension Other        parent, other relative  . Heart disease Other        parent, other relative  . Heart disease Mother   . Asthma Mother   . Breast cancer Maternal Aunt 68  . Parkinson's disease Maternal Grandmother   . Rheumatic fever Maternal Grandfather     Review of Systems  Constitutional: Positive for fatigue. Negative for fever.  Respiratory: Negative for shortness of breath.   Cardiovascular: Positive for leg swelling. Negative for chest pain and palpitations.  Gastrointestinal: Positive for abdominal distention (bloating) and nausea. Negative for diarrhea (soft, semi-formed stools) and vomiting.  Neurological: Negative for  dizziness, light-headedness and headaches.       Objective:   Vitals:   03/26/19 0957  BP: (!) 170/94  Pulse: 65  Resp: 16  Temp: 98.3 F (36.8 C)  SpO2: 99%   BP Readings from Last 3 Encounters:  03/26/19 (!) 170/94  03/19/19 (!) 172/100  03/07/19 (!) 167/71   Wt Readings from Last 3 Encounters:  03/26/19 113 lb (51.3 kg)  03/19/19 111 lb 6.4 oz (50.5 kg)  02/26/19 110 lb (49.9 kg)   Body mass index is 21.35 kg/m.   Physical Exam    Constitutional: chronically ill-appearing. No distress.  HENT:  Head: Normocephalic and atraumatic.  Neck: Neck supple. No tracheal deviation present. No thyromegaly present.  No cervical lymphadenopathy Cardiovascular: Normal rate, regular rhythm and normal heart sounds.   No murmur heard.   2 + edema bilateral lower extremities, pitting Pulmonary/Chest: Effort normal and breath sounds normal. No respiratory distress. No has no wheezes. No rales. Abdomen: Distended, soft, slight discomfort, but no pain Skin: Skin is warm and dry. Not diaphoretic.  Psychiatric: Normal mood and affect. Behavior is normal.      Assessment & Plan:    See Problem List for Assessment and Plan of chronic medical problems.    This visit occurred during the SARS-CoV-2 public health emergency.  Safety protocols were in place, including screening questions prior to the visit, additional usage of staff PPE, and extensive cleaning of exam room while observing appropriate contact time as indicated for disinfecting solutions.

## 2019-03-26 ENCOUNTER — Ambulatory Visit (INDEPENDENT_AMBULATORY_CARE_PROVIDER_SITE_OTHER): Payer: Medicare Other | Admitting: Internal Medicine

## 2019-03-26 ENCOUNTER — Encounter: Payer: Self-pay | Admitting: Internal Medicine

## 2019-03-26 ENCOUNTER — Other Ambulatory Visit: Payer: Self-pay

## 2019-03-26 VITALS — BP 170/94 | HR 65 | Temp 98.3°F | Resp 16 | Ht 61.0 in | Wt 113.0 lb

## 2019-03-26 DIAGNOSIS — M7989 Other specified soft tissue disorders: Secondary | ICD-10-CM

## 2019-03-26 DIAGNOSIS — I2699 Other pulmonary embolism without acute cor pulmonale: Secondary | ICD-10-CM

## 2019-03-26 DIAGNOSIS — R600 Localized edema: Secondary | ICD-10-CM

## 2019-03-26 DIAGNOSIS — R112 Nausea with vomiting, unspecified: Secondary | ICD-10-CM

## 2019-03-26 DIAGNOSIS — R197 Diarrhea, unspecified: Secondary | ICD-10-CM

## 2019-03-26 DIAGNOSIS — E538 Deficiency of other specified B group vitamins: Secondary | ICD-10-CM

## 2019-03-26 DIAGNOSIS — I739 Peripheral vascular disease, unspecified: Secondary | ICD-10-CM

## 2019-03-26 DIAGNOSIS — I10 Essential (primary) hypertension: Secondary | ICD-10-CM

## 2019-03-26 MED ORDER — FUROSEMIDE 20 MG PO TABS: ORAL_TABLET | ORAL | 4 refills | Status: DC

## 2019-03-26 MED ORDER — ONDANSETRON 8 MG PO TBDP: 8.0000 mg | ORAL_TABLET | Freq: Three times a day (TID) | ORAL | 5 refills | Status: DC | PRN

## 2019-03-26 MED ORDER — FOLIC ACID 1 MG PO TABS: 1.0000 mg | ORAL_TABLET | Freq: Every day | ORAL | 5 refills | Status: DC

## 2019-03-26 MED ORDER — CYANOCOBALAMIN 1000 MCG/ML IJ SOLN
1000.0000 ug | Freq: Once | INTRAMUSCULAR | Status: AC
  Administered 2019-03-26: 1000 ug via INTRAMUSCULAR

## 2019-03-26 NOTE — Assessment & Plan Note (Signed)
B12 level was very low-because of chronic diarrhea and malabsorption likely not able to absorb any oral B12 Had first B12 injection last week We will give second injection today and weekly for the next couple of weeks and then monthly Possibly the cause of tingling in hands and feet

## 2019-03-26 NOTE — Assessment & Plan Note (Signed)
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 [name] Lower extremity ultrasounds done in the past week showed no DVT Continue anticoagulation

## 2019-03-26 NOTE — Assessment & Plan Note (Signed)
Chronic Worse some days, better others She is elevating her legs She is not vomiting, is drinking plenty of fluids and is having more formed stools so I think she is lower risk for dehydration She will take the furosemide 40 mg daily for 3 days and then as needed to see if that helps We will check blood work at her next visit

## 2019-03-26 NOTE — Assessment & Plan Note (Signed)
Recent blood work done by GI shows folic acid deficiency We will start folic acid 1 mg daily

## 2019-03-26 NOTE — Assessment & Plan Note (Signed)
Chronic Continues to have nausea, but has not vomited Continue Zofran 2-3 times a day as needed Stools have improved and are soft, semiformed.  No longer having diarrhea Continue Lomotil/Imodium She will continue to eat frequent smaller meals ?  Improvement since discontinuing Benicar Hopefully her symptoms will continue to improve Follow-up with Dr. Earlean Shawl in 3-4 weeks scheduled

## 2019-03-26 NOTE — Patient Instructions (Addendum)
  B12 injection administered today.     Medications reviewed and updated.  Changes include :   Take furosemide daily x 3 days for your leg swelling then daily as needed.   Start folic acid daily.   Your prescription(s) have been submitted to your pharmacy. Please take as directed and contact our office if you believe you are having problem(s) with the medication(s).     Nurse visit in one week.  Follow up with me in 2-3 weeks.

## 2019-03-26 NOTE — Addendum Note (Signed)
Addended by: Delice Bison E on: 03/26/2019 04:25 PM   Modules accepted: Orders

## 2019-03-26 NOTE — Assessment & Plan Note (Signed)
Chronic BP very well controlled at home, but elevated here-tends to be high here We will continue current medications and she will continue to monitor her blood pressure at home Blood work at her next visit

## 2019-04-04 ENCOUNTER — Ambulatory Visit: Payer: Medicare Other

## 2019-04-07 NOTE — Progress Notes (Signed)
Subjective:    Patient ID: [name], female    DOB: 1945-10-09, 74 y.o.   MRN: [id number]  HPI The patient is here for follow up of their chronic medical problems.  She has not been eating as healthy as she should.  She knows she needs to start making changes in what she is eating.  Hypertension: She has been taking her medication.  Her blood pressure at home has been better controlled than it is here-BP 130/80's.    B12 def: She has noticed improvement in the numbness/tingling in her fingers and toes.  Her energy level did initially improve, but now is stable.  Diarrhea, pancreatic insufficiency:  She takes the lomotil prn.  She is taking the pancreatic enzymes as prescribed.  She has not had liquid diarrhea for over one month ago.    She still has abdominal bloating and periodic pain.    chronic nausea: her nausea is better.  She has not taken the zofran in 3-4 days.  She has not vomited in a few weeks.  She is keeping a food down.  She thinks she is drinking enough fluids and wonders if she is drinking too much.  LE swelling:  She took lasix x 5 days two weeks ago.  She is unsure if it helped or not.  She still has the leg swelling.  She has an upcoming appointment with rheumatology and gastroenterology.  Her dermatologist is trying to get her approved for Stelara, but she may need to go on Cosentyx first.  The Stelara would possibly addressed her psoriasis, arthritis and GI issues.  Medications and allergies reviewed with patient and updated if appropriate.  Patient Active Problem List   Diagnosis Date Noted  . Folic acid deficiency 09/38/1829  . Bilateral leg edema 03/19/2019  . Hemorrhoids 03/19/2019  . B12 deficiency 03/19/2019  . Abnormal CT of the abdomen 02/27/2019  . Abnormal endoscopy of upper gastrointestinal tract 02/27/2019  . Neuroendocrine tumor 02/27/2019  . Iron deficiency anemia 02/19/2019  . Protein-calorie malnutrition, severe 01/31/2019  .  Hypomagnesemia   . AKI (acute kidney injury) ([place]) 01/30/2019  . Hyponatremia 01/04/2019  . Inflammatory arthritis 01/02/2019  . Positive ANA (antinuclear antibody) 01/02/2019  . Exocrine pancreatic insufficiency 10/23/2018  . Multiple pulmonary nodules determined by computed tomography of lung 09/18/2018  . Decreased GFR 08/21/2018  . Nausea vomiting and diarrhea 08/20/2018  . Syncope 08/01/2018  . Hypokalemia 08/01/2018  . Abnormal CT scan, pelvis 07/31/2018  . Pneumothorax 03/14/2018  . Lung abnormality 01/11/2018  . Pulmonary embolism ([place]) 12/24/2017  . Lung mass 12/24/2017  . Small vessel disease, cerebrovascular 07/31/2017  . Atypical chest pain 06/14/2017  . Dizziness 06/14/2017  . Hyperuricemia 05/03/2017  . Arthralgia 05/01/2017  . Hair loss 05/01/2017  . Vitamin D deficiency 10/31/2016  . Diarrhea 06/06/2016  . Diastolic dysfunction 93/71/6967  . Vertigo 12/24/2015  . Nonspecific abnormal electrocardiogram (ECG) (EKG) 12/24/2015  . Bilateral carotid artery disease, Mild 05/31/2015  . Thyroid nodule 05/31/2015  . Psoriasis   . Scoliosis   . Diabetes type 2, controlled ([place]) 06/03/2008  . Dyslipidemia 06/03/2008  . CARPAL TUNNEL SYNDROME 06/03/2008  . HTN (hypertension) 06/03/2008  . ALLERGIC RHINITIS 06/03/2008  . Asthma 06/03/2008  . GERD (gastroesophageal reflux disease) 06/03/2008  . Osteopenia 06/03/2008  . COLONIC POLYPS, HX OF 06/03/2008    Current Outpatient Medications on File Prior to Visit  Medication Sig Dispense Refill  . apixaban (ELIQUIS) 5 MG TABS tablet Take  1 tablet (5 mg total) by mouth 2 (two) times daily. 60 tablet 5  . Ascorbic Acid (VITAMIN C GUMMIE PO) Take 1 tablet by mouth every evening.    . Cholecalciferol (VITAMIN D3 GUMMIES ADULT PO) Take 1 tablet by mouth every evening.    . clindamycin (CLEOCIN T) 1 % external solution Apply 1 application topically daily as needed (wounds).     . clobetasol (TEMOVATE) 0.05 % external solution  Apply 1 application topically every morning.     . Coenzyme Q10 (COQ10 GUMMIES ADULT PO) Take 1 tablet by mouth every evening.    Marland Kitchen CREON 12000 units CPEP capsule Take 12,000 Units by mouth 3 (three) times daily with meals.     . diphenoxylate-atropine (LOMOTIL) 2.5-0.025 MG tablet Take 1 tablet by mouth 3 (three) times daily as needed for diarrhea or loose stools. 90 tablet 1  . folic acid (FOLVITE) 1 MG tablet Take 1 tablet (1 mg total) by mouth daily. 30 tablet 5  . furosemide (LASIX) 20 MG tablet Take 1 tablet (20 mg total) by mouth daily for 3 days only, then take 1 tablet (20 mg total) by mouth daily only as needed for swelling thereafter. 30 tablet 4  . halobetasol (ULTRAVATE) 0.05 % cream Apply 1 application topically daily. After bathing    . hydrocortisone 2.5 % cream Apply topically 2 (two) times daily. 30 g 5  . lisinopril (ZESTRIL) 10 MG tablet Take 1 tablet (10 mg total) by mouth daily. 30 tablet 5  . loperamide (IMODIUM) 2 MG capsule Take 1 capsule (2 mg total) by mouth 2 (two) times daily. (Patient taking differently: Take 2 mg by mouth 3 (three) times daily as needed for diarrhea or loose stools. ) 30 capsule 0  . loratadine (CLARITIN) 10 MG tablet Take 10 mg by mouth daily as needed for allergies.    . Multiple Vitamins-Minerals (ADULT GUMMY PO) Take 1 tablet by mouth every evening.    . nebivolol (BYSTOLIC) 2.5 MG tablet TAKE ONE (1) TABLET BY MOUTH EVERY DAY 30 tablet 5  . ondansetron (ZOFRAN ODT) 8 MG disintegrating tablet Take 1 tablet (8 mg total) by mouth every 8 (eight) hours as needed for nausea or vomiting. 90 tablet 5  . ONETOUCH ULTRA test strip USE TEST STRIPS TO CHECK BLOOD SUGAR AT LEAST 3 TIMES DAILY 300 strip 1  . oxymetazoline (AFRIN) 0.05 % nasal spray Place 1 spray into both nostrils 2 (two) times daily.    Vladimir Faster Glycol-Propyl Glycol (LUBRICANT EYE DROPS) 0.4-0.3 % SOLN Place 1 drop into both eyes daily as needed (dry/irritated eyes.).     No current  facility-administered medications on file prior to visit.    Past Medical History:  Diagnosis Date  . ALLERGIC RHINITIS   . ANEMIA-NOS   . Arthritis   . ASTHMA   . Carpal tunnel syndrome   . COLONIC POLYPS, HX OF   . Coronary artery disease    "mild CAD" noted on 12/05/17 in coronary CT scan  . DIABETES MELLITUS, TYPE II    diet controlled  . GERD   . Hand tingling   . HYPERLIPIDEMIA   . HYPERTENSION   . Neuroendocrine tumor   . OSTEOPENIA   . PONV (postoperative nausea and vomiting)    " it relaxes my bladder muscles and I have been known to pee all over the place"  . Psoriasis    severe, began soriatane 01/2012  . Pulmonary embolism ([place])   . Rectal  fissure   . Scoliosis   . Wears glasses     Past Surgical History:  Procedure Laterality Date  . benign rectal growth  2004   removed by Dr. Zella Richer  . BIOPSY  08/09/2018   Procedure: BIOPSY;  Surgeon: Carol Ada, MD;  Location: [place];  Service: Endoscopy;;  . BIOPSY  08/22/2018   Procedure: BIOPSY;  Surgeon: Juanita Craver, MD;  Location: [place];  Service: Endoscopy;;  . BIOPSY  03/07/2019   Procedure: BIOPSY;  Surgeon: Irving Copas., MD;  Location: [place];  Service: Gastroenterology;;  . BREAST BIOPSY Right   . BREAST EXCISIONAL BIOPSY Left   . BREAST SURGERY  1988   biopsy  . CESAREAN SECTION    . COLONOSCOPY N/A 08/22/2018   Procedure: COLONOSCOPY;  Surgeon: Juanita Craver, MD;  Location: [facility] ENDOSCOPY;  Service: Endoscopy;  Laterality: N/A;  . ENDOSCOPIC MUCOSAL RESECTION N/A 03/07/2019   Procedure: ENDOSCOPIC MUCOSAL RESECTION;  Surgeon: Rush Landmark Telford Nab., MD;  Location: [place];  Service: Gastroenterology;  Laterality: N/A;  . ESOPHAGOGASTRODUODENOSCOPY (EGD) WITH PROPOFOL N/A 08/04/2018   Procedure: ESOPHAGOGASTRODUODENOSCOPY (EGD) WITH PROPOFOL;  Surgeon: Ladene Artist, MD;  Location: [place];  Service: Gastroenterology;  Laterality: N/A;  . ESOPHAGOGASTRODUODENOSCOPY  (EGD) WITH PROPOFOL N/A 03/07/2019   Procedure: ESOPHAGOGASTRODUODENOSCOPY (EGD) WITH PROPOFOL;  Surgeon: Rush Landmark Telford Nab., MD;  Location: [place];  Service: Gastroenterology;  Laterality: N/A;  . EUS N/A 03/07/2019   Procedure: UPPER ENDOSCOPIC ULTRASOUND (EUS) RADIAL;  Surgeon: Rush Landmark Telford Nab., MD;  Location: [place];  Service: Gastroenterology;  Laterality: N/A;  . FINE NEEDLE ASPIRATION  03/07/2019   Procedure: FINE NEEDLE ASPIRATION (FNA) LINEAR;  Surgeon: Irving Copas., MD;  Location: [place];  Service: Gastroenterology;;  . Otho Darner SIGMOIDOSCOPY N/A 08/04/2018   Procedure: FLEXIBLE SIGMOIDOSCOPY;  Surgeon: Ladene Artist, MD;  Location: [place];  Service: Gastroenterology;  Laterality: N/A;  . FLEXIBLE SIGMOIDOSCOPY N/A 08/09/2018   Procedure: FLEXIBLE SIGMOIDOSCOPY;  Surgeon: Carol Ada, MD;  Location: [place];  Service: Endoscopy;  Laterality: N/A;  . HEMOSTASIS CLIP PLACEMENT  03/07/2019   Procedure: HEMOSTASIS CLIP PLACEMENT;  Surgeon: Irving Copas., MD;  Location: [place];  Service: Gastroenterology;;  . Lia Foyer LIFTING INJECTION  03/07/2019   Procedure: SUBMUCOSAL LIFTING INJECTION;  Surgeon: Irving Copas., MD;  Location: [place];  Service: Gastroenterology;;  . VIDEO BRONCHOSCOPY WITH ENDOBRONCHIAL NAVIGATION N/A 03/14/2018   Procedure: VIDEO BRONCHOSCOPY WITH ENDOBRONCHIAL NAVIGATION;  Surgeon: Collene Gobble, MD;  Location: [facility];  Service: Thoracic;  Laterality: N/A;    Social History   Socioeconomic History  . Marital status: Single    Spouse name: Not on file  . Number of children: 1  . Years of education: 63  . Highest education level: Bachelor's degree (e.g., BA, AB, BS)  Occupational History  . Occupation: retired Careers adviser  Tobacco Use  . Smoking status: Former Smoker    Packs/day: 1.00    Years: 10.00    Pack years: 10.00    Quit date: 02/14/1974      Years since quitting: 45.1  . Smokeless tobacco: Never Used  Substance and Sexual Activity  . Alcohol use: No  . Drug use: No  . Sexual activity: Not on file  Other Topics Concern  . Not on file  Social History Narrative   Lives alone in a one story home.  Has one child and one grandchild.  Retired Careers adviser.  Education: college.    Social Determinants of Health   Financial Resource Strain:   . Difficulty of Paying Living Expenses: Not on file  Food Insecurity:   . Worried About Charity fundraiser in the Last Year: Not on file  . Ran Out of Food in the Last Year: Not on file  Transportation Needs:   . Lack of Transportation (Medical): Not on file  . Lack of Transportation (Non-Medical): Not on file  Physical Activity:   . Days of Exercise per Week: Not on file  . Minutes of Exercise per Session: Not on file  Stress:   . Feeling of Stress : Not on file  Social Connections:   . Frequency of Communication with Friends and Family: Not on file  . Frequency of Social Gatherings with Friends and Family: Not on file  . Attends Religious Services: Not on file  . Active Member of Clubs or Organizations: Not on file  . Attends Archivist Meetings: Not on file  . Marital Status: Not on file    Family History  Problem Relation Age of Onset  . Lung cancer Father   . Arthritis Other        Parents  . Asthma Other        parent, other relative  . Breast cancer Other        other relative  . Hypertension Other        parent, other relative  . Heart disease Other        parent, other relative  . Heart disease Mother   . Asthma Mother   . Breast cancer Maternal Aunt 68  . Parkinson's disease Maternal Grandmother   . Rheumatic fever Maternal Grandfather     Review of Systems  Constitutional: Negative for fever.  Respiratory: Negative for cough, shortness of breath and wheezing.   Cardiovascular: Positive for leg swelling.  Negative for chest pain and palpitations.  Gastrointestinal: Positive for abdominal distention, abdominal pain, diarrhea and nausea. Negative for vomiting.  Neurological: Negative for light-headedness and headaches.       Objective:   Vitals:   04/09/19 1019  BP: (!) 162/94  Pulse: 82  Resp: 14  Temp: 98.3 F (36.8 C)  SpO2: 99%   BP Readings from Last 3 Encounters:  04/09/19 (!) 162/94  03/26/19 (!) 170/94  03/19/19 (!) 172/100   Wt Readings from Last 3 Encounters:  04/09/19 116 lb (52.6 kg)  03/26/19 113 lb (51.3 kg)  03/19/19 111 lb 6.4 oz (50.5 kg)   Body mass index is 21.92 kg/m.   Physical Exam    Constitutional: Appears well-developed and well-nourished. No distress.  HENT:  Head: Normocephalic and atraumatic.  Neck: Neck supple. No tracheal deviation present. No thyromegaly present.  No cervical lymphadenopathy Cardiovascular: Normal rate, regular rhythm and normal heart sounds.  No murmur heard. No carotid bruit .  1+ bilateral lower extremity pitting edema Pulmonary/Chest: Effort normal and breath sounds normal. No respiratory distress. No has no wheezes. No rales.  Skin: Skin is warm and dry. Not diaphoretic.  Psychiatric: Normal mood and affect. Behavior is normal.      Assessment & Plan:    See Problem List for Assessment and Plan of chronic medical problems.    This visit occurred during the SARS-CoV-2 public health emergency.  Safety protocols were in place, including screening questions prior to the visit, additional usage of staff PPE, and extensive cleaning of exam room while observing appropriate contact time  as indicated for disinfecting solutions.

## 2019-04-07 NOTE — Patient Instructions (Addendum)
  Blood work was ordered.    You received a B12 injection today.  Have another one next week and then monthly.   Medications reviewed and updated.  Changes include :  none       Please followup in 2  months

## 2019-04-08 ENCOUNTER — Other Ambulatory Visit: Payer: Self-pay

## 2019-04-09 ENCOUNTER — Ambulatory Visit (INDEPENDENT_AMBULATORY_CARE_PROVIDER_SITE_OTHER): Payer: Medicare Other | Admitting: Internal Medicine

## 2019-04-09 ENCOUNTER — Other Ambulatory Visit: Payer: Self-pay

## 2019-04-09 ENCOUNTER — Encounter: Payer: Self-pay | Admitting: Internal Medicine

## 2019-04-09 VITALS — BP 162/94 | HR 82 | Temp 98.3°F | Resp 14 | Ht 61.0 in | Wt 116.0 lb

## 2019-04-09 DIAGNOSIS — E538 Deficiency of other specified B group vitamins: Secondary | ICD-10-CM | POA: Diagnosis not present

## 2019-04-09 DIAGNOSIS — I10 Essential (primary) hypertension: Secondary | ICD-10-CM

## 2019-04-09 DIAGNOSIS — R197 Diarrhea, unspecified: Secondary | ICD-10-CM

## 2019-04-09 DIAGNOSIS — R112 Nausea with vomiting, unspecified: Secondary | ICD-10-CM

## 2019-04-09 DIAGNOSIS — R600 Localized edema: Secondary | ICD-10-CM

## 2019-04-09 DIAGNOSIS — K219 Gastro-esophageal reflux disease without esophagitis: Secondary | ICD-10-CM

## 2019-04-09 DIAGNOSIS — E559 Vitamin D deficiency, unspecified: Secondary | ICD-10-CM

## 2019-04-09 DIAGNOSIS — E119 Type 2 diabetes mellitus without complications: Secondary | ICD-10-CM

## 2019-04-09 LAB — CBC WITH DIFFERENTIAL/PLATELET
Basophils Absolute: 0.1 10*3/uL (ref 0.0–0.1)
Basophils Relative: 1 % (ref 0.0–3.0)
Eosinophils Absolute: 0.4 10*3/uL (ref 0.0–0.7)
Eosinophils Relative: 4.4 % (ref 0.0–5.0)
HCT: 32.4 % — ABNORMAL LOW (ref 36.0–46.0)
Hemoglobin: 10.8 g/dL — ABNORMAL LOW (ref 12.0–15.0)
Lymphocytes Relative: 24.1 % (ref 12.0–46.0)
Lymphs Abs: 2 10*3/uL (ref 0.7–4.0)
MCHC: 33.3 g/dL (ref 30.0–36.0)
MCV: 94 fl (ref 78.0–100.0)
Monocytes Absolute: 0.6 10*3/uL (ref 0.1–1.0)
Monocytes Relative: 6.7 % (ref 3.0–12.0)
Neutro Abs: 5.4 10*3/uL (ref 1.4–7.7)
Neutrophils Relative %: 63.8 % (ref 43.0–77.0)
Platelets: 362 10*3/uL (ref 150.0–400.0)
RBC: 3.45 Mil/uL — ABNORMAL LOW (ref 3.87–5.11)
RDW: 16.7 % — ABNORMAL HIGH (ref 11.5–15.5)
WBC: 8.5 10*3/uL (ref 4.0–10.5)

## 2019-04-09 LAB — COMPREHENSIVE METABOLIC PANEL
ALT: 93 U/L — ABNORMAL HIGH (ref 0–35)
AST: 93 U/L — ABNORMAL HIGH (ref 0–37)
Albumin: 3.6 g/dL (ref 3.5–5.2)
Alkaline Phosphatase: 177 U/L — ABNORMAL HIGH (ref 39–117)
BUN: 13 mg/dL (ref 6–23)
CO2: 29 mEq/L (ref 19–32)
Calcium: 8.3 mg/dL — ABNORMAL LOW (ref 8.4–10.5)
Chloride: 104 mEq/L (ref 96–112)
Creatinine, Ser: 0.74 mg/dL (ref 0.40–1.20)
GFR: 76.41 mL/min (ref >60.00)
Glucose, Bld: 86 mg/dL (ref 70–99)
Potassium: 3.8 mEq/L (ref 3.5–5.1)
Sodium: 141 mEq/L (ref 135–145)
Total Bilirubin: 0.4 mg/dL (ref 0.2–1.2)
Total Protein: 6.9 g/dL (ref 6.0–8.3)

## 2019-04-09 LAB — MAGNESIUM: Magnesium: 0.9 mg/dL — CL (ref 1.5–2.5)

## 2019-04-09 LAB — VITAMIN D 25 HYDROXY (VIT D DEFICIENCY, FRACTURES): VITD: 32.55 ng/mL (ref 30.00–100.00)

## 2019-04-09 MED ORDER — CYANOCOBALAMIN 1000 MCG/ML IJ SOLN
1000.0000 ug | Freq: Once | INTRAMUSCULAR | Status: AC
  Administered 2019-04-09: 1000 ug via INTRAMUSCULAR

## 2019-04-09 MED ORDER — MAGNESIUM OXIDE 400 MG PO CAPS: 400.0000 mg | ORAL_CAPSULE | Freq: Every day | ORAL | 1 refills | Status: DC

## 2019-04-09 MED ORDER — OMEPRAZOLE 20 MG PO CPDR: 20.0000 mg | DELAYED_RELEASE_CAPSULE | Freq: Every day | ORAL | 0 refills | Status: DC

## 2019-04-09 NOTE — Assessment & Plan Note (Signed)
Check magnesium level

## 2019-04-09 NOTE — Assessment & Plan Note (Signed)
Has not vomited and nausea is better Continue Zofran as needed only

## 2019-04-09 NOTE — Assessment & Plan Note (Signed)
Numbness/tingling has improved B12 injection today, B12 injection next week and then continue B12 injection monthly

## 2019-04-09 NOTE — Assessment & Plan Note (Signed)
Chronic Blood pressure well controlled at home-typically 130s/high 70s-80s Continue current medication-if kidney function is good will add Lasix 20 mg daily to help with the leg edema CMP

## 2019-04-09 NOTE — Telephone Encounter (Signed)
Patient has a critical lab value with a magnesium level of 0.9. Please advise.

## 2019-04-09 NOTE — Addendum Note (Signed)
Addended by: Delice Bison E on: 04/09/2019 01:57 PM   Modules accepted: Orders

## 2019-04-09 NOTE — Assessment & Plan Note (Signed)
Chronic She did take Lasix 20 mg daily for 5-6 days and she is unsure how much it improved Has not taken it since then CMP today and if kidney function is okay we will try Lasix 20 mg daily since she is eating and drinking much better and not currently having diarrhea She is eating a good amount of protein and hopefully as her albumin level improves leg swelling will also improve

## 2019-04-09 NOTE — Assessment & Plan Note (Addendum)
Taking 2600 units daily We will check level to see if she is absorbing it

## 2019-04-09 NOTE — Assessment & Plan Note (Signed)
Chronic Diet controlled She has not been compliant with a diabetic diet recently, but just got her appetite back and will start to adjust her diet We will check A1c

## 2019-04-09 NOTE — Telephone Encounter (Signed)
Recent blood work is still pending, but we did get a call from the lab and her magnesium level is very low.  This can contribute to fatigue, muscle symptoms, nausea, etc.   It may or may not be contributing to her symptoms.     Start magnesium 400 mg daily -- rx pendng.

## 2019-04-09 NOTE — Assessment & Plan Note (Signed)
Chronic GERD controlled Continue omeprazole 20 mg daily  

## 2019-04-11 ENCOUNTER — Other Ambulatory Visit: Payer: Medicare Other

## 2019-04-11 ENCOUNTER — Encounter: Payer: Self-pay | Admitting: Internal Medicine

## 2019-04-11 DIAGNOSIS — E119 Type 2 diabetes mellitus without complications: Secondary | ICD-10-CM

## 2019-04-12 ENCOUNTER — Encounter: Payer: Self-pay | Admitting: Internal Medicine

## 2019-04-12 LAB — HEMOGLOBIN A1C
Hgb A1c MFr Bld: 5 % of total Hgb (ref <5.7)
Mean Plasma Glucose: 97 (calc)
eAG (mmol/L): 5.4 (calc)

## 2019-04-19 ENCOUNTER — Ambulatory Visit (INDEPENDENT_AMBULATORY_CARE_PROVIDER_SITE_OTHER): Payer: Medicare Other | Admitting: *Deleted

## 2019-04-19 ENCOUNTER — Other Ambulatory Visit: Payer: Self-pay

## 2019-04-19 DIAGNOSIS — E538 Deficiency of other specified B group vitamins: Secondary | ICD-10-CM | POA: Diagnosis not present

## 2019-04-19 MED ORDER — CYANOCOBALAMIN 1000 MCG/ML IJ SOLN
1000.0000 ug | Freq: Once | INTRAMUSCULAR | Status: AC
  Administered 2019-04-19: 1000 ug via INTRAMUSCULAR

## 2019-04-19 NOTE — Progress Notes (Signed)
b12 Injection given.   Caitlin Angello J [name], MD  

## 2019-04-19 NOTE — Progress Notes (Signed)
Pls cosign for B12.Marland KitchenJohny Chess

## 2019-05-17 ENCOUNTER — Ambulatory Visit: Payer: Medicare Other

## 2019-05-22 ENCOUNTER — Other Ambulatory Visit: Payer: Self-pay

## 2019-05-22 ENCOUNTER — Ambulatory Visit (INDEPENDENT_AMBULATORY_CARE_PROVIDER_SITE_OTHER): Payer: Medicare Other | Admitting: *Deleted

## 2019-05-22 DIAGNOSIS — E538 Deficiency of other specified B group vitamins: Secondary | ICD-10-CM | POA: Diagnosis not present

## 2019-05-22 MED ORDER — CYANOCOBALAMIN 1000 MCG/ML IJ SOLN
1000.0000 ug | Freq: Once | INTRAMUSCULAR | Status: AC
  Administered 2019-05-22: 1000 ug via INTRAMUSCULAR

## 2019-05-22 NOTE — Progress Notes (Signed)
Pls cosign for B12.Marland KitchenJohny Chess

## 2019-05-29 NOTE — Progress Notes (Signed)
Cardiology Office Note    Date:  06/04/2019   ID:  Caitlin, Rios 1945/11/22, MRN [id number]  PCP:  [name], MD  Cardiologist: Ena Dawley, MD EPS: None  No chief complaint on file.   History of Present Illness:  [name] is a 74 y.o. 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 Lake Bells.  Last saw Dr. [name] 01/16/19 and given Lasix 20 mg daily for 5 days for edema. Carotid ultrasound 03/20/19 1-39%. prox CCA tortuous but no aneursysm. Patient was hospitatized 01/2019 with chronic diarrhea, weight loss and dehydration and felt to have a neuroendocrine tumor.  Patient comes in for f/u. Has gained some weight back. Lists of BP's from home normal. Xarelto switched to eliquis in the hospital. Has more bruising on the eliquis. Has a lot of back pain and has been skipping some eliquis doses so she can take tylenol/advil. LDL yesterday 171.can't take statin.   Past Medical History:  Diagnosis Date  . ALLERGIC RHINITIS   . ANEMIA-NOS   . Arthritis   . ASTHMA   . Carpal tunnel syndrome   . COLONIC POLYPS, HX OF   . Coronary artery disease    "mild CAD" noted on 12/05/17 in coronary CT scan  . DIABETES MELLITUS, TYPE II    diet controlled  . GERD   . Hand tingling   . HYPERLIPIDEMIA   . HYPERTENSION   . Neuroendocrine tumor   . OSTEOPENIA   . PONV (postoperative nausea and vomiting)    " it relaxes my bladder muscles and I have been known to pee all over the place"  . Psoriasis    severe, began soriatane 01/2012  . Pulmonary embolism ([place])   . Rectal fissure   . Scoliosis   . Wears glasses     Past Surgical History:  Procedure Laterality Date  . benign rectal growth  2004   removed by Dr. Zella Richer  . BIOPSY  08/09/2018   Procedure:  BIOPSY;  Surgeon: Carol Ada, MD;  Location: [facility];  Service: Endoscopy;;  . BIOPSY  08/22/2018   Procedure: BIOPSY;  Surgeon: Juanita Craver, MD;  Location: [facility];  Service: Endoscopy;;  . BIOPSY  03/07/2019   Procedure: BIOPSY;  Surgeon: Irving Copas., MD;  Location: [facility];  Service: Gastroenterology;;  . BREAST BIOPSY Right   . BREAST EXCISIONAL BIOPSY Left   . BREAST SURGERY  1988   biopsy  . CESAREAN SECTION    . COLONOSCOPY N/A 08/22/2018   Procedure: COLONOSCOPY;  Surgeon: Juanita Craver, MD;  Location: [facility] ENDOSCOPY;  Service: Endoscopy;  Laterality: N/A;  . ENDOSCOPIC MUCOSAL RESECTION N/A 03/07/2019   Procedure: ENDOSCOPIC MUCOSAL RESECTION;  Surgeon: Rush Landmark Telford Nab., MD;  Location: [place];  Service: Gastroenterology;  Laterality: N/A;  . ESOPHAGOGASTRODUODENOSCOPY (EGD) WITH PROPOFOL N/A 08/04/2018   Procedure: ESOPHAGOGASTRODUODENOSCOPY (EGD) WITH PROPOFOL;  Surgeon: Ladene Artist, MD;  Location: [place];  Service: Gastroenterology;  Laterality: N/A;  . ESOPHAGOGASTRODUODENOSCOPY (EGD) WITH PROPOFOL N/A 03/07/2019   Procedure: ESOPHAGOGASTRODUODENOSCOPY (EGD) WITH PROPOFOL;  Surgeon: Rush Landmark Telford Nab., MD;  Location: [place];  Service: Gastroenterology;  Laterality: N/A;  . EUS N/A 03/07/2019   Procedure: UPPER ENDOSCOPIC ULTRASOUND (EUS) RADIAL;  Surgeon: Rush Landmark Telford Nab., MD;  Location: [place];  Service: Gastroenterology;  Laterality: N/A;  . FINE NEEDLE ASPIRATION  03/07/2019   Procedure: FINE NEEDLE ASPIRATION (FNA) LINEAR;  Surgeon: Irving Copas., MD;  Location: [place];  Service: Gastroenterology;;  . Otho Darner SIGMOIDOSCOPY N/A 08/04/2018   Procedure: FLEXIBLE SIGMOIDOSCOPY;  Surgeon: Ladene Artist, MD;  Location: [place];  Service: Gastroenterology;  Laterality: N/A;  . FLEXIBLE SIGMOIDOSCOPY N/A 08/09/2018   Procedure: FLEXIBLE SIGMOIDOSCOPY;  Surgeon: Carol Ada, MD;  Location: [place];  Service: Endoscopy;  Laterality: N/A;  . HEMOSTASIS CLIP PLACEMENT  03/07/2019   Procedure: HEMOSTASIS CLIP PLACEMENT;  Surgeon: Irving Copas., MD;  Location: [facility];  Service: Gastroenterology;;  . Lia Foyer LIFTING INJECTION  03/07/2019   Procedure: SUBMUCOSAL LIFTING INJECTION;  Surgeon: Irving Copas., MD;  Location: [facility];  Service: Gastroenterology;;  . VIDEO BRONCHOSCOPY WITH ENDOBRONCHIAL NAVIGATION N/A 03/14/2018   Procedure: VIDEO BRONCHOSCOPY WITH ENDOBRONCHIAL NAVIGATION;  Surgeon: Collene Gobble, MD;  Location: [facility];  Service: [facility];  Laterality: N/A;    Current Medications: Current Meds  Medication Sig  . apixaban (ELIQUIS) 5 MG TABS tablet Take 1 tablet (5 mg total) by mouth 2 (two) times daily.  . Ascorbic Acid (VITAMIN C GUMMIE PO) Take 1 tablet by mouth every evening.  . Cholecalciferol (VITAMIN D3 GUMMIES ADULT PO) Take 1 tablet by mouth every evening.  . clindamycin (CLEOCIN T) 1 % external solution Apply 1 application topically daily as needed (wounds).   . clobetasol (TEMOVATE) 0.05 % external solution Apply 1 application topically every morning.   . Coenzyme Q10 (COQ10 GUMMIES ADULT PO) Take 1 tablet by mouth every evening.  Marland Kitchen CREON 12000 units CPEP capsule Take 12,000 Units by mouth 3 (three) times daily with meals.   . folic acid (FOLVITE) 1 MG tablet Take 1 tablet (1 mg total) by mouth daily.  . halobetasol (ULTRAVATE) 0.05 % cream Apply 1 application topically daily. After bathing  . hydrocortisone 2.5 % cream Apply topically 2 (two) times daily.  Marland Kitchen lisinopril (ZESTRIL) 10 MG tablet Take 1 tablet (10 mg total) by mouth daily.  Marland Kitchen loratadine (CLARITIN) 10 MG tablet Take 10 mg by mouth daily as needed for allergies.  . Magnesium Oxide 400 MG CAPS Take 1 capsule (400 mg total) by mouth daily.  . Multiple Vitamins-Minerals (ADULT GUMMY PO) Take 1 tablet by mouth every evening.  . nebivolol (BYSTOLIC) 2.5 MG tablet TAKE ONE  (1) TABLET BY MOUTH EVERY DAY  . omeprazole (PRILOSEC) 20 MG capsule Take 1 capsule (20 mg total) by mouth daily.  [name] test strip USE TEST STRIPS TO CHECK BLOOD SUGAR AT LEAST 3 TIMES DAILY  . oxymetazoline (AFRIN) 0.05 % nasal spray Place 1 spray into both nostrils 2 (two) times daily.  Vladimir Faster Glycol-Propyl Glycol (LUBRICANT EYE DROPS) 0.4-0.3 % SOLN Place 1 drop into both eyes daily as needed (dry/irritated eyes.).  Marland Kitchen vitamin B-12 (CYANOCOBALAMIN) 1000 MCG tablet Take 1,000 mcg by mouth daily.     Allergies:   Phenergan [promethazine hcl], Amlodipine, Clarithromycin, Codeine, Erythromycin, Hydralazine hcl, Statins, and Tetracycline   Social History   Socioeconomic History  . Marital status: Single    Spouse name: Not on file  . Number of children: 1  . Years of education: 37  . Highest education level: Bachelor's degree (e.g., BA, AB, BS)  Occupational History  . Occupation: retired Careers adviser  Tobacco Use  . Smoking status: Former Smoker    Packs/day: 1.00    Years:  10.00    Pack years: 10.00    Quit date: 02/14/1974    Years since quitting: 45.3  . Smokeless tobacco: Never Used  Substance and Sexual Activity  . Alcohol use: No  . Drug use: No  . Sexual activity: Not on file  Other Topics Concern  . Not on file  Social History Narrative   Lives alone in a one story home.  Has one child and one grandchild.  Retired Careers adviser.  Education: college.    Social Determinants of Health   Financial Resource Strain:   . Difficulty of Paying Living Expenses:   Food Insecurity:   . Worried About Charity fundraiser in the Last Year:   . Arboriculturist in the Last Year:   Transportation Needs:   . Film/video editor (Medical):   Marland Kitchen Lack of Transportation (Non-Medical):   Physical Activity:   . Days of Exercise per Week:   . Minutes of Exercise per Session:   Stress:   . Feeling of Stress  :   Social Connections:   . Frequency of Communication with Friends and Family:   . Frequency of Social Gatherings with Friends and Family:   . Attends Religious Services:   . Active Member of Clubs or Organizations:   . Attends Archivist Meetings:   Marland Kitchen Marital Status:      Family History:  The patient's   family history includes Arthritis in an other family member; Asthma in her mother and another family member; Breast cancer in an other family member; Breast cancer (age of onset: 43) in her maternal aunt; Heart disease in her mother and another family member; Hypertension in an other family member; Lung cancer in her father; Parkinson's disease in her maternal grandmother; Rheumatic fever in her maternal grandfather.   ROS:   Please see the history of present illness.    ROS All other systems reviewed and are negative.   PHYSICAL EXAM:   VS:  BP (!) 156/96   Pulse 67   Ht 5\' 1"  (1.549 m)   Wt 123 lb 12.8 oz (56.2 kg)   SpO2 99%   BMI 23.39 kg/m   Physical Exam  GEN: Well nourished, well developed, in no acute distress  Neck: no JVD, carotid bruits, or masses Cardiac:RRR; no murmurs, rubs, or gallops  Respiratory:  clear to auscultation bilaterally, normal work of breathing GI: soft, nontender, nondistended, + BS Ext: without cyanosis, clubbing, or edema, Good distal pulses bilaterally Neuro:  Alert and Oriented x 3 Psych: euthymic mood, full affect  Wt Readings from Last 3 Encounters:  06/04/19 123 lb 12.8 oz (56.2 kg)  06/03/19 123 lb (55.8 kg)  04/09/19 116 lb (52.6 kg)      Studies/Labs Reviewed:   EKG:  EKG is not ordered today.   Recent Labs: 08/09/2018: TSH 2.889 06/03/2019: ALT 22; BUN 23; Creatinine, Ser 0.90; Hemoglobin 13.1; Magnesium 1.9; Platelets 349.0; Potassium 3.8; Sodium 138   Lipid Panel    Component Value Date/Time   CHOL 277 (H) 06/03/2019 0845   CHOL 179 09/03/2018 0824   TRIG 137.0 06/03/2019 0845   HDL 78.60 06/03/2019 0845    HDL 42 09/03/2018 0824   CHOLHDL 4 06/03/2019 0845   VLDL 27.4 06/03/2019 0845   LDLCALC 171 (H) 06/03/2019 0845   LDLCALC 87 09/03/2018 0824   LDLDIRECT 165.0 04/30/2018 0908    Additional studies/ records that were reviewed today include:  Carotid  Dopplers 03/20/2019  Summary:  Right Carotid: Velocities in the right ICA are consistent with a 1-39%  stenosis.                Non-hemodynamically significant plaque <50% noted in the  CCA.                Proximal CCA is tortuous and measures 1.43 cm x 1.40 cm.   Left Carotid: Velocities in the left ICA are consistent with a 1-39%  stenosis.   Vertebrals: Bilateral vertebral arteries demonstrate antegrade flow.  Subclavians: Right subclavian artery flow was disturbed. Normal flow               hemodynamics were seen in the left subclavian artery.   *See table(s) above for measurements and observations.      Electronically signed by Larae Grooms MD on 03/21/2019 at 10:06:21 AM.        Final       ASSESSMENT:    1. History of pulmonary embolus (PE)   2. Essential hypertension   3. Hyperlipidemia, unspecified hyperlipidemia type   4. Coronary artery disease involving native coronary artery of native heart without angina pectoris   5. Neuroendocrine tumor      PLAN:  In order of problems listed above:  History of unprovoked pulmonary embolus 2019 now on Eliquis most likely lifelong. Crt 0.90 and CBC normal. She was skipping her afternoon eliquis dose on occasion to take advil. I advised her she cannot miss her eliquis doses.  HTN-BP controlled at home on lisinopril.   HLD- LDL 171, trig 137-can't take statin and has problem with increased LFT's which were normal yesterday.. Doesn't want to consider PCSK9I unless she discusses with Dr. Earlean Shawl but willing to discuss with pharmacist.She'll work on her diet for now.  Mild nonobstructive CAD on coronary CTA can't tolerate statin-will refer to  pharmacist.  ?neuroendocrine tumor followed closely by Dr. Earlean Shawl and PCP  Medication Adjustments/Labs and Tests Ordered: Current medicines are reviewed at length with the patient today.  Concerns regarding medicines are outlined above.  Medication changes, Labs and Tests ordered today are listed in the Patient Instructions below. Patient Instructions  Medication Instructions:  Your physician recommends that you continue on your current medications as directed. Please refer to the Current Medication list given to you today.  *If you need a refill on your cardiac medications before your next appointment, please call your pharmacy*   Lab Work: None ordered  If you have labs (blood work) drawn today and your tests are completely normal, you will receive your results only by: Marland Kitchen MyChart Message (if you have MyChart) OR . A paper copy in the mail If you have any lab test that is abnormal or we need to change your treatment, we will call you to review the results.   Testing/Procedures: None ordered   Follow-Up: You have been referred to the [facility], you and your health needs are our priority.  As part of our continuing mission to provide you with exceptional heart care, we have created designated Provider Care Teams.  These Care Teams include your primary Cardiologist (physician) and Advanced Practice Providers (APPs -  Physician Assistants and Nurse Practitioners) who all work together to provide you with the care you need, when you need it.  We recommend signing up for the patient portal called "MyChart".  Sign up information is provided on this After Visit Summary.  MyChart is used to connect  with patients for Virtual Visits (Telemedicine).  Patients are able to view lab/test results, encounter notes, upcoming appointments, etc.  Non-urgent messages can be sent to your provider as well.   To learn more about what you can do with MyChart, go to  [email or web address].    Your next appointment:   6 month(s)  The format for your next appointment:   In Person  Provider:   You may see Ena Dawley, MD or one of the following Advanced Practice Providers on your designated Care Team:    Melina Copa, PA-C  Ermalinda Barrios, PA-C    Other Instructions  Fat and Cholesterol Restricted Eating Plan Getting too much fat and cholesterol in your diet may cause health problems. Choosing the right foods helps keep your fat and cholesterol at normal levels. This can keep you from getting certain diseases. Your doctor may recommend an eating plan that includes:  Total fat: ______% or less of total calories a day.  Saturated fat: ______% or less of total calories a day.  Cholesterol: less than _________mg a day.  Fiber: ______g a day. What are tips for following this plan? Meal planning  At meals, divide your plate into four equal parts: ? Fill one-half of your plate with vegetables and green salads. ? Fill one-fourth of your plate with whole grains. ? Fill one-fourth of your plate with low-fat (lean) protein foods.  Eat fish that is high in omega-3 fats at least two times a week. This includes mackerel, tuna, sardines, and salmon.  Eat foods that are high in fiber, such as whole grains, beans, apples, broccoli, carrots, peas, and barley. General tips   Work with your doctor to lose weight if you need to.  Avoid: ? Foods with added sugar. ? Fried foods. ? Foods with partially hydrogenated oils.  Limit alcohol intake to no more than 1 drink a day for nonpregnant women and 2 drinks a day for men. One drink equals 12 oz of beer, 5 oz of wine, or 1 oz of hard liquor. Reading food labels  Check food labels for: ? Trans fats. ? Partially hydrogenated oils. ? Saturated fat (g) in each serving. ? Cholesterol (mg) in each serving. ? Fiber (g) in each serving.  Choose foods with healthy fats, such as: ? Monounsaturated  fats. ? Polyunsaturated fats. ? Omega-3 fats.  Choose grain products that have whole grains. Look for the word "whole" as the first word in the ingredient list. Cooking  Cook foods using low-fat methods. These include baking, boiling, grilling, and broiling.  Eat more home-cooked foods. Eat at restaurants and buffets less often.  Avoid cooking using saturated fats, such as butter, cream, palm oil, palm kernel oil, and coconut oil. Recommended foods  Fruits  All fresh, canned (in natural juice), or frozen fruits. Vegetables  Fresh or frozen vegetables (raw, steamed, roasted, or grilled). Green salads. Grains  Whole grains, such as whole wheat or whole grain breads, crackers, cereals, and pasta. Unsweetened oatmeal, bulgur, barley, quinoa, or brown rice. Corn or whole wheat flour tortillas. Meats and other protein foods  Ground beef (85% or leaner), grass-fed beef, or beef trimmed of fat. Skinless chicken or [place]. Ground chicken or [place]. Pork trimmed of fat. All fish and seafood. Egg whites. Dried beans, peas, or lentils. Unsalted nuts or seeds. Unsalted canned beans. Nut butters without added sugar or oil. Dairy  Low-fat or nonfat dairy products, such as skim or 1% milk, 2% or reduced-fat cheeses, low-fat and fat-free ricotta  or cottage cheese, or plain low-fat and nonfat yogurt. Fats and oils  Tub margarine without trans fats. Light or reduced-fat mayonnaise and salad dressings. Avocado. Olive, canola, sesame, or safflower oils. The items listed above may not be a complete list of foods and beverages you can eat. Contact a dietitian for more information. Foods to avoid Fruits  Canned fruit in heavy syrup. Fruit in cream or butter sauce. Fried fruit. Vegetables  Vegetables cooked in cheese, cream, or butter sauce. Fried vegetables. Grains  White bread. White pasta. White rice. Cornbread. Bagels, pastries, and croissants. Crackers and snack foods that contain trans fat and  hydrogenated oils. Meats and other protein foods  Fatty cuts of meat. Ribs, chicken wings, bacon, sausage, bologna, salami, chitterlings, fatback, hot dogs, bratwurst, and packaged lunch meats. Liver and organ meats. Whole eggs and egg yolks. Chicken and [place] with skin. Fried meat. Dairy  Whole or 2% milk, cream, half-and-half, and cream cheese. Whole milk cheeses. Whole-fat or sweetened yogurt. Full-fat cheeses. Nondairy creamers and whipped toppings. Processed cheese, cheese spreads, and cheese curds. Beverages  Alcohol. Sugar-sweetened drinks such as sodas, lemonade, and fruit drinks. Fats and oils  Butter, stick margarine, lard, shortening, ghee, or bacon fat. Coconut, palm kernel, and palm oils. Sweets and desserts  Corn syrup, sugars, honey, and molasses. Candy. Jam and jelly. Syrup. Sweetened cereals. Cookies, pies, cakes, donuts, muffins, and ice cream. The items listed above may not be a complete list of foods and beverages you should avoid. Contact a dietitian for more information. Summary  Choosing the right foods helps keep your fat and cholesterol at normal levels. This can keep you from getting certain diseases.  At meals, fill one-half of your plate with vegetables and green salads.  Eat high-fiber foods, like whole grains, beans, apples, carrots, peas, and barley.  Limit added sugar, saturated fats, alcohol, and fried foods. This information is not intended to replace advice given to you by your health care provider. Make sure you discuss any questions you have with your health care provider. Document Revised: 10/04/2017 Document Reviewed: 10/18/2016 Elsevier Patient Education  2020 [place], Ermalinda Barrios, [place]  06/04/2019 10:27 AM    [facility] [address], [place], [place]  [id number] Phone: [phone]; Fax: [phone]

## 2019-06-02 NOTE — Progress Notes (Signed)
Subjective:    Patient ID: [name], female    DOB: March 21, 1945, 74 y.o.   MRN: [id number]  HPI The patient is here for follow up of their chronic medical problems, including diabetes, hypertension, B12 def, N/V, diarrhea, leg edema, hypomagnesemia,    BP at home  - 115/67 - 136-79, HR 58-67   She has had only fleeting nausea that may last 15 min and she has not taken any medication for this.  She has not had any diarrhea and has not needed any medications.   She has some constipation.  And it will improve once if needed.  She feels stronger and she is eating everything in sight.  She has gained weight.  She is not eating healthy.  She is worried about her sugars.  She has started to have sweats at different times, which is unusual.  She never sweat much prior.   She is having a lot of back and hip pain.    She is trying to walk twice a day for one mile - she just started walking twice a day.  Her balance is not as good and she finds herself shuffling.    She is not sleeping as well at night.    She has occasional severe headaches in the evening.  She thinks this may be related to seasonal allergies  Her ankle swelling is better - she used topical steroids around her ankles and thinks that helped.  The lasix did not help.    Feeling of bugs crawling on skin.  That is a new sensation.  There is no rash and never any bugs.  She does not think she is thinking as clearly - she forgets more.      Medications and allergies reviewed with patient and updated if appropriate.  Patient Active Problem List   Diagnosis Date Noted  . Folic acid deficiency 15/40/0867  . Bilateral leg edema 03/19/2019  . Hemorrhoids 03/19/2019  . B12 deficiency 03/19/2019  . Abnormal CT of the abdomen 02/27/2019  . Abnormal endoscopy of upper gastrointestinal tract 02/27/2019  . Neuroendocrine tumor 02/27/2019  . Iron deficiency anemia 02/19/2019  . Protein-calorie malnutrition, severe  01/31/2019  . Hypomagnesemia   . AKI (acute kidney injury) ([place]) 01/30/2019  . Inflammatory arthritis 01/02/2019  . Positive ANA (antinuclear antibody) 01/02/2019  . Exocrine pancreatic insufficiency 10/23/2018  . Multiple pulmonary nodules determined by computed tomography of lung 09/18/2018  . Decreased GFR 08/21/2018  . Nausea vomiting and diarrhea 08/20/2018  . Syncope 08/01/2018  . Hypokalemia 08/01/2018  . Abnormal CT scan, pelvis 07/31/2018  . Pneumothorax 03/14/2018  . Lung abnormality 01/11/2018  . Pulmonary embolism ([place]) 12/24/2017  . Lung mass 12/24/2017  . Small vessel disease, cerebrovascular 07/31/2017  . Atypical chest pain 06/14/2017  . Dizziness 06/14/2017  . Hyperuricemia 05/03/2017  . Arthralgia 05/01/2017  . Hair loss 05/01/2017  . Vitamin D deficiency 10/31/2016  . Diarrhea 06/06/2016  . Diastolic dysfunction 61/95/0932  . Vertigo 12/24/2015  . Nonspecific abnormal electrocardiogram (ECG) (EKG) 12/24/2015  . Bilateral carotid artery disease, Mild 05/31/2015  . Thyroid nodule 05/31/2015  . Psoriasis   . Scoliosis   . Diabetes type 2, controlled ([place]) 06/03/2008  . Dyslipidemia 06/03/2008  . CARPAL TUNNEL SYNDROME 06/03/2008  . HTN (hypertension) 06/03/2008  . ALLERGIC RHINITIS 06/03/2008  . Asthma 06/03/2008  . GERD (gastroesophageal reflux disease) 06/03/2008  . Osteopenia 06/03/2008  . COLONIC POLYPS, HX OF 06/03/2008    Current Outpatient  Medications on File Prior to Visit  Medication Sig Dispense Refill  . apixaban (ELIQUIS) 5 MG TABS tablet Take 1 tablet (5 mg total) by mouth 2 (two) times daily. 60 tablet 5  . Ascorbic Acid (VITAMIN C GUMMIE PO) Take 1 tablet by mouth every evening.    . Cholecalciferol (VITAMIN D3 GUMMIES ADULT PO) Take 1 tablet by mouth every evening.    . clindamycin (CLEOCIN T) 1 % external solution Apply 1 application topically daily as needed (wounds).     . clobetasol (TEMOVATE) 0.05 % external solution Apply 1  application topically every morning.     . Coenzyme Q10 (COQ10 GUMMIES ADULT PO) Take 1 tablet by mouth every evening.    Marland Kitchen CREON 12000 units CPEP capsule Take 12,000 Units by mouth 3 (three) times daily with meals.     . folic acid (FOLVITE) 1 MG tablet Take 1 tablet (1 mg total) by mouth daily. 30 tablet 5  . halobetasol (ULTRAVATE) 0.05 % cream Apply 1 application topically daily. After bathing    . hydrocortisone 2.5 % cream Apply topically 2 (two) times daily. 30 g 5  . lisinopril (ZESTRIL) 10 MG tablet Take 1 tablet (10 mg total) by mouth daily. 30 tablet 5  . loratadine (CLARITIN) 10 MG tablet Take 10 mg by mouth daily as needed for allergies.    . Magnesium Oxide 400 MG CAPS Take 1 capsule (400 mg total) by mouth daily. 90 capsule 1  . Multiple Vitamins-Minerals (ADULT GUMMY PO) Take 1 tablet by mouth every evening.    . nebivolol (BYSTOLIC) 2.5 MG tablet TAKE ONE (1) TABLET BY MOUTH EVERY DAY 30 tablet 5  . omeprazole (PRILOSEC) 20 MG capsule Take 1 capsule (20 mg total) by mouth daily. 60 capsule 0  . ONETOUCH ULTRA test strip USE TEST STRIPS TO CHECK BLOOD SUGAR AT LEAST 3 TIMES DAILY 300 strip 1  . oxymetazoline (AFRIN) 0.05 % nasal spray Place 1 spray into both nostrils 2 (two) times daily.    Vladimir Faster Glycol-Propyl Glycol (LUBRICANT EYE DROPS) 0.4-0.3 % SOLN Place 1 drop into both eyes daily as needed (dry/irritated eyes.).     No current facility-administered medications on file prior to visit.    Past Medical History:  Diagnosis Date  . ALLERGIC RHINITIS   . ANEMIA-NOS   . Arthritis   . ASTHMA   . Carpal tunnel syndrome   . COLONIC POLYPS, HX OF   . Coronary artery disease    "mild CAD" noted on 12/05/17 in coronary CT scan  . DIABETES MELLITUS, TYPE II    diet controlled  . GERD   . Hand tingling   . HYPERLIPIDEMIA   . HYPERTENSION   . Neuroendocrine tumor   . OSTEOPENIA   . PONV (postoperative nausea and vomiting)    " it relaxes my bladder muscles and I  have been known to pee all over the place"  . Psoriasis    severe, began soriatane 01/2012  . Pulmonary embolism ([place])   . Rectal fissure   . Scoliosis   . Wears glasses     Past Surgical History:  Procedure Laterality Date  . benign rectal growth  2004   removed by Dr. Zella Richer  . BIOPSY  08/09/2018   Procedure: BIOPSY;  Surgeon: Carol Ada, MD;  Location: [facility];  Service: Endoscopy;;  . BIOPSY  08/22/2018   Procedure: BIOPSY;  Surgeon: Juanita Craver, MD;  Location: [facility];  Service: Endoscopy;;  . BIOPSY  03/07/2019   Procedure: BIOPSY;  Surgeon: Irving Copas., MD;  Location: [place] [facility];  Service: Gastroenterology;;  . BREAST BIOPSY Right   . BREAST EXCISIONAL BIOPSY Left   . BREAST SURGERY  1988   biopsy  . CESAREAN SECTION    . COLONOSCOPY N/A 08/22/2018   Procedure: COLONOSCOPY;  Surgeon: Juanita Craver, MD;  Location: [facility] ENDOSCOPY;  Service: Endoscopy;  Laterality: N/A;  . ENDOSCOPIC MUCOSAL RESECTION N/A 03/07/2019   Procedure: ENDOSCOPIC MUCOSAL RESECTION;  Surgeon: Rush Landmark Telford Nab., MD;  Location: [place];  Service: Gastroenterology;  Laterality: N/A;  . ESOPHAGOGASTRODUODENOSCOPY (EGD) WITH PROPOFOL N/A 08/04/2018   Procedure: ESOPHAGOGASTRODUODENOSCOPY (EGD) WITH PROPOFOL;  Surgeon: Ladene Artist, MD;  Location: [place];  Service: Gastroenterology;  Laterality: N/A;  . ESOPHAGOGASTRODUODENOSCOPY (EGD) WITH PROPOFOL N/A 03/07/2019   Procedure: ESOPHAGOGASTRODUODENOSCOPY (EGD) WITH PROPOFOL;  Surgeon: Rush Landmark Telford Nab., MD;  Location: [place];  Service: Gastroenterology;  Laterality: N/A;  . EUS N/A 03/07/2019   Procedure: UPPER ENDOSCOPIC ULTRASOUND (EUS) RADIAL;  Surgeon: Rush Landmark Telford Nab., MD;  Location: [place];  Service: Gastroenterology;  Laterality: N/A;  . FINE NEEDLE ASPIRATION  03/07/2019   Procedure: FINE NEEDLE ASPIRATION (FNA) LINEAR;  Surgeon: Irving Copas., MD;  Location: [place];   Service: Gastroenterology;;  . Otho Darner SIGMOIDOSCOPY N/A 08/04/2018   Procedure: FLEXIBLE SIGMOIDOSCOPY;  Surgeon: Ladene Artist, MD;  Location: [place];  Service: Gastroenterology;  Laterality: N/A;  . FLEXIBLE SIGMOIDOSCOPY N/A 08/09/2018   Procedure: FLEXIBLE SIGMOIDOSCOPY;  Surgeon: Carol Ada, MD;  Location: [place];  Service: Endoscopy;  Laterality: N/A;  . HEMOSTASIS CLIP PLACEMENT  03/07/2019   Procedure: HEMOSTASIS CLIP PLACEMENT;  Surgeon: Irving Copas., MD;  Location: [facility];  Service: Gastroenterology;;  . Lia Foyer LIFTING INJECTION  03/07/2019   Procedure: SUBMUCOSAL LIFTING INJECTION;  Surgeon: Irving Copas., MD;  Location: [facility];  Service: Gastroenterology;;  . VIDEO BRONCHOSCOPY WITH ENDOBRONCHIAL NAVIGATION N/A 03/14/2018   Procedure: VIDEO BRONCHOSCOPY WITH ENDOBRONCHIAL NAVIGATION;  Surgeon: Collene Gobble, MD;  Location: [facility];  Service: Thoracic;  Laterality: N/A;    Social History   Socioeconomic History  . Marital status: Single    Spouse name: Not on file  . Number of children: 1  . Years of education: 17  . Highest education level: Bachelor's degree (e.g., BA, AB, BS)  Occupational History  . Occupation: retired Careers adviser  Tobacco Use  . Smoking status: Former Smoker    Packs/day: 1.00    Years: 10.00    Pack years: 10.00    Quit date: 02/14/1974    Years since quitting: 45.3  . Smokeless tobacco: Never Used  Substance and Sexual Activity  . Alcohol use: No  . Drug use: No  . Sexual activity: Not on file  Other Topics Concern  . Not on file  Social History Narrative   Lives alone in a one story home.  Has one child and one grandchild.  Retired Careers adviser.  Education: college.    Social Determinants of Health   Financial Resource Strain:   . Difficulty of Paying Living Expenses:   Food Insecurity:   . Worried About Charity fundraiser  in the Last Year:   . Arboriculturist in the Last Year:   Transportation Needs:   . Film/video editor (Medical):   Marland Kitchen Lack of Transportation (Non-Medical):   Physical Activity:   . Days of Exercise per Week:   .  Minutes of Exercise per Session:   Stress:   . Feeling of Stress :   Social Connections:   . Frequency of Communication with Friends and Family:   . Frequency of Social Gatherings with Friends and Family:   . Attends Religious Services:   . Active Member of Clubs or Organizations:   . Attends Archivist Meetings:   Marland Kitchen Marital Status:     Family History  Problem Relation Age of Onset  . Lung cancer Father   . Arthritis Other        Parents  . Asthma Other        parent, other relative  . Breast cancer Other        other relative  . Hypertension Other        parent, other relative  . Heart disease Other        parent, other relative  . Heart disease Mother   . Asthma Mother   . Breast cancer Maternal Aunt 68  . Parkinson's disease Maternal Grandmother   . Rheumatic fever Maternal Grandfather     Review of Systems  Constitutional: Negative for chills and fever.  Respiratory: Positive for cough (allergy related) and wheezing (a little after walking - related to pollen  - stops when she wears a mask). Negative for shortness of breath.   Cardiovascular: Negative for chest pain, palpitations and leg swelling.  Gastrointestinal: Positive for abdominal distention (bloating), constipation and nausea (occ, transient). Negative for abdominal pain and diarrhea.       GERD controlled  Neurological: Positive for headaches. Negative for dizziness, light-headedness and numbness.  Psychiatric/Behavioral: Positive for sleep disturbance.       Objective:   Vitals:   06/03/19 0757  BP: (!) 174/98  Pulse: 65  Resp: 16  Temp: 98 F (36.7 C)  SpO2: 99%   BP Readings from Last 3 Encounters:  06/03/19 (!) 174/98  04/09/19 (!) 162/94  03/26/19 (!) 170/94    Wt Readings from Last 3 Encounters:  06/03/19 123 lb (55.8 kg)  04/09/19 116 lb (52.6 kg)  03/26/19 113 lb (51.3 kg)   Body mass index is 23.24 kg/m.   Physical Exam    Constitutional: Appears well-developed and well-nourished. No distress.  HENT:  Head: Normocephalic and atraumatic.  Neck: Neck supple. No tracheal deviation present. No thyromegaly present.  No cervical lymphadenopathy Cardiovascular: Normal rate, regular rhythm and normal heart sounds.   No murmur heard. No carotid bruit .  No edema Pulmonary/Chest: Effort normal and breath sounds normal. No respiratory distress. No has no wheezes. No rales. Abdomen: Soft, minimal tenderness more from bloating/distention, no rebound or guarding. Skin: Skin is warm and dry. Not diaphoretic.  Ecchymosis on hands from blood thinner Psychiatric: Normal mood and affect. Behavior is normal.      Assessment & Plan:    See Problem List for Assessment and Plan of chronic medical problems.    This visit occurred during the SARS-CoV-2 public health emergency.  Safety protocols were in place, including screening questions prior to the visit, additional usage of staff PPE, and extensive cleaning of exam room while observing appropriate contact time as indicated for disinfecting solutions.

## 2019-06-02 NOTE — Patient Instructions (Addendum)
  Blood work was ordered.     Medications reviewed and updated.  Changes include :   none    Please followup in 5 months

## 2019-06-03 ENCOUNTER — Encounter: Payer: Self-pay | Admitting: Internal Medicine

## 2019-06-03 ENCOUNTER — Other Ambulatory Visit: Payer: Self-pay

## 2019-06-03 ENCOUNTER — Ambulatory Visit (INDEPENDENT_AMBULATORY_CARE_PROVIDER_SITE_OTHER): Payer: Medicare Other | Admitting: Internal Medicine

## 2019-06-03 VITALS — BP 174/98 | HR 65 | Temp 98.0°F | Resp 16 | Ht 61.0 in | Wt 123.0 lb

## 2019-06-03 DIAGNOSIS — R600 Localized edema: Secondary | ICD-10-CM | POA: Diagnosis not present

## 2019-06-03 DIAGNOSIS — E785 Hyperlipidemia, unspecified: Secondary | ICD-10-CM | POA: Diagnosis not present

## 2019-06-03 DIAGNOSIS — E538 Deficiency of other specified B group vitamins: Secondary | ICD-10-CM

## 2019-06-03 DIAGNOSIS — E119 Type 2 diabetes mellitus without complications: Secondary | ICD-10-CM | POA: Diagnosis not present

## 2019-06-03 DIAGNOSIS — R112 Nausea with vomiting, unspecified: Secondary | ICD-10-CM

## 2019-06-03 DIAGNOSIS — D508 Other iron deficiency anemias: Secondary | ICD-10-CM | POA: Diagnosis not present

## 2019-06-03 DIAGNOSIS — R197 Diarrhea, unspecified: Secondary | ICD-10-CM

## 2019-06-03 DIAGNOSIS — I10 Essential (primary) hypertension: Secondary | ICD-10-CM

## 2019-06-03 LAB — COMPREHENSIVE METABOLIC PANEL
ALT: 22 U/L (ref 0–35)
AST: 21 U/L (ref 0–37)
Albumin: 4.3 g/dL (ref 3.5–5.2)
Alkaline Phosphatase: 120 U/L — ABNORMAL HIGH (ref 39–117)
BUN: 23 mg/dL (ref 6–23)
CO2: 26 mEq/L (ref 19–32)
Calcium: 9.7 mg/dL (ref 8.4–10.5)
Chloride: 102 mEq/L (ref 96–112)
Creatinine, Ser: 0.9 mg/dL (ref 0.40–1.20)
GFR: 60.94 mL/min (ref >60.00)
Glucose, Bld: 99 mg/dL (ref 70–99)
Potassium: 3.8 mEq/L (ref 3.5–5.1)
Sodium: 138 mEq/L (ref 135–145)
Total Bilirubin: 0.5 mg/dL (ref 0.2–1.2)
Total Protein: 7.8 g/dL (ref 6.0–8.3)

## 2019-06-03 LAB — CBC WITH DIFFERENTIAL/PLATELET
Basophils Absolute: 0.1 10*3/uL (ref 0.0–0.1)
Basophils Relative: 1.2 % (ref 0.0–3.0)
Eosinophils Absolute: 0.2 10*3/uL (ref 0.0–0.7)
Eosinophils Relative: 2.2 % (ref 0.0–5.0)
HCT: 39.9 % (ref 36.0–46.0)
Hemoglobin: 13.1 g/dL (ref 12.0–15.0)
Lymphocytes Relative: 20.9 % (ref 12.0–46.0)
Lymphs Abs: 2.1 10*3/uL (ref 0.7–4.0)
MCHC: 32.8 g/dL (ref 30.0–36.0)
MCV: 93.7 fl (ref 78.0–100.0)
Monocytes Absolute: 0.6 10*3/uL (ref 0.1–1.0)
Monocytes Relative: 6 % (ref 3.0–12.0)
Neutro Abs: 6.9 10*3/uL (ref 1.4–7.7)
Neutrophils Relative %: 69.7 % (ref 43.0–77.0)
Platelets: 349 10*3/uL (ref 150.0–400.0)
RBC: 4.25 Mil/uL (ref 3.87–5.11)
RDW: 14.6 % (ref 11.5–15.5)
WBC: 9.9 10*3/uL (ref 4.0–10.5)

## 2019-06-03 LAB — VITAMIN B12: Vitamin B-12: 513 pg/mL (ref 211–911)

## 2019-06-03 LAB — LIPID PANEL
Cholesterol: 277 mg/dL — ABNORMAL HIGH (ref 0–200)
HDL: 78.6 mg/dL (ref >39.00)
LDL Cholesterol: 171 mg/dL — ABNORMAL HIGH (ref 0–99)
NonHDL: 198.16
Total CHOL/HDL Ratio: 4
Triglycerides: 137 mg/dL (ref 0.0–149.0)
VLDL: 27.4 mg/dL (ref 0.0–40.0)

## 2019-06-03 LAB — IRON,TIBC AND FERRITIN PANEL
%SAT: 14 % (calc) — ABNORMAL LOW (ref 16–45)
Ferritin: 15 ng/mL — ABNORMAL LOW (ref 16–288)
Iron: 64 ug/dL (ref 45–160)
TIBC: 459 mcg/dL (calc) — ABNORMAL HIGH (ref 250–450)

## 2019-06-03 LAB — HEMOGLOBIN A1C: Hgb A1c MFr Bld: 5.7 % (ref 4.6–6.5)

## 2019-06-03 LAB — FOLATE: Folate: >23.7 ng/mL (ref >5.9)

## 2019-06-03 LAB — MAGNESIUM: Magnesium: 1.9 mg/dL (ref 1.5–2.5)

## 2019-06-03 NOTE — Assessment & Plan Note (Signed)
Chronic Blood pressure well controlled at home, elevated here, which is likely some whitecoat hypertension Continue current medication at current dose CMP

## 2019-06-03 NOTE — Assessment & Plan Note (Signed)
Chronic Has been low on iron has had anemia along with fatigue We will check CBC, iron panel

## 2019-06-03 NOTE — Assessment & Plan Note (Signed)
Chronic Statin intolerant Has not been taking Zetia due to concerns over side effects Lipid panel today

## 2019-06-03 NOTE — Assessment & Plan Note (Signed)
Chronic Diet controlled She has not been eating as well as she should and has gained some weight We will check A1c

## 2019-06-03 NOTE — Assessment & Plan Note (Signed)
Taking magnesium supplement We will check level

## 2019-06-03 NOTE — Assessment & Plan Note (Signed)
Chronic Edema not responsive to Lasix Improved with topical steroid cream She feels this was likely related to arthritis Improved, currently has no edema

## 2019-06-03 NOTE — Assessment & Plan Note (Signed)
Chronic Likely related to malabsorption Taking folic acid daily We will check level

## 2019-06-03 NOTE — Assessment & Plan Note (Signed)
Chronic-likely related to malabsorption Has received a few B12 injections Would like to see if she can tolerate oral B12 and will start that daily We will check level today and will monitor

## 2019-06-03 NOTE — Assessment & Plan Note (Signed)
Has had only fleeting nausea and has not needed the antinausea medication Has not vomited Has not had any diarrhea and has not needed the antidiarrheal medication Overall symptoms have improved She is following with GI ?  Benicar caused sprue and enteropathy GI will be reevaluating for neuroendocrine tumor

## 2019-06-04 ENCOUNTER — Ambulatory Visit (INDEPENDENT_AMBULATORY_CARE_PROVIDER_SITE_OTHER): Payer: Medicare Other | Admitting: Physician Assistant

## 2019-06-04 ENCOUNTER — Encounter: Payer: Self-pay | Admitting: Physician Assistant

## 2019-06-04 VITALS — BP 156/96 | HR 67 | Ht 61.0 in | Wt 123.8 lb

## 2019-06-04 DIAGNOSIS — I10 Essential (primary) hypertension: Secondary | ICD-10-CM

## 2019-06-04 DIAGNOSIS — E785 Hyperlipidemia, unspecified: Secondary | ICD-10-CM | POA: Diagnosis not present

## 2019-06-04 DIAGNOSIS — Z86711 Personal history of pulmonary embolism: Secondary | ICD-10-CM

## 2019-06-04 DIAGNOSIS — D3A8 Other benign neuroendocrine tumors: Secondary | ICD-10-CM

## 2019-06-04 DIAGNOSIS — I2510 Atherosclerotic heart disease of native coronary artery without angina pectoris: Secondary | ICD-10-CM | POA: Diagnosis not present

## 2019-06-04 NOTE — Patient Instructions (Addendum)
Medication Instructions:  Your physician recommends that you continue on your current medications as directed. Please refer to the Current Medication list given to you today.  *If you need a refill on your cardiac medications before your next appointment, please call your pharmacy*   Lab Work: None ordered  If you have labs (blood work) drawn today and your tests are completely normal, you will receive your results only by: Marland Kitchen MyChart Message (if you have MyChart) OR . A paper copy in the mail If you have any lab test that is abnormal or we need to change your treatment, we will call you to review the results.   Testing/Procedures: None ordered   Follow-Up: You have been referred to the [facility], you and your health needs are our priority.  As part of our continuing mission to provide you with exceptional heart care, we have created designated Provider Care Teams.  These Care Teams include your primary Cardiologist (physician) and Advanced Practice Providers (APPs -  Physician Assistants and Nurse Practitioners) who all work together to provide you with the care you need, when you need it.  We recommend signing up for the patient portal called "MyChart".  Sign up information is provided on this After Visit Summary.  MyChart is used to connect with patients for Virtual Visits (Telemedicine).  Patients are able to view lab/test results, encounter notes, upcoming appointments, etc.  Non-urgent messages can be sent to your provider as well.   To learn more about what you can do with MyChart, go to [email or web address].    Your next appointment:   6 month(s)  The format for your next appointment:   In Person  Provider:   You may see Ena Dawley, MD or one of the following Advanced Practice Providers on your designated Care Team:    Melina Copa, PA-C  Ermalinda Barrios, PA-C    Other Instructions  Fat and Cholesterol Restricted Eating Plan Getting too  much fat and cholesterol in your diet may cause health problems. Choosing the right foods helps keep your fat and cholesterol at normal levels. This can keep you from getting certain diseases. Your doctor may recommend an eating plan that includes:  Total fat: ______% or less of total calories a day.  Saturated fat: ______% or less of total calories a day.  Cholesterol: less than _________mg a day.  Fiber: ______g a day. What are tips for following this plan? Meal planning  At meals, divide your plate into four equal parts: ? Fill one-half of your plate with vegetables and green salads. ? Fill one-fourth of your plate with whole grains. ? Fill one-fourth of your plate with low-fat (lean) protein foods.  Eat fish that is high in omega-3 fats at least two times a week. This includes mackerel, tuna, sardines, and salmon.  Eat foods that are high in fiber, such as whole grains, beans, apples, broccoli, carrots, peas, and barley. General tips   Work with your doctor to lose weight if you need to.  Avoid: ? Foods with added sugar. ? Fried foods. ? Foods with partially hydrogenated oils.  Limit alcohol intake to no more than 1 drink a day for nonpregnant women and 2 drinks a day for men. One drink equals 12 oz of beer, 5 oz of wine, or 1 oz of hard liquor. Reading food labels  Check food labels for: ? Trans fats. ? Partially hydrogenated oils. ? Saturated fat (g) in each serving. ? Cholesterol (  mg) in each serving. ? Fiber (g) in each serving.  Choose foods with healthy fats, such as: ? Monounsaturated fats. ? Polyunsaturated fats. ? Omega-3 fats.  Choose grain products that have whole grains. Look for the word "whole" as the first word in the ingredient list. Cooking  Cook foods using low-fat methods. These include baking, boiling, grilling, and broiling.  Eat more home-cooked foods. Eat at restaurants and buffets less often.  Avoid cooking using saturated fats, such  as butter, cream, palm oil, palm kernel oil, and coconut oil. Recommended foods  Fruits  All fresh, canned (in natural juice), or frozen fruits. Vegetables  Fresh or frozen vegetables (raw, steamed, roasted, or grilled). Green salads. Grains  Whole grains, such as whole wheat or whole grain breads, crackers, cereals, and pasta. Unsweetened oatmeal, bulgur, barley, quinoa, or brown rice. Corn or whole wheat flour tortillas. Meats and other protein foods  Ground beef (85% or leaner), grass-fed beef, or beef trimmed of fat. Skinless chicken or [place]. Ground chicken or [place]. Pork trimmed of fat. All fish and seafood. Egg whites. Dried beans, peas, or lentils. Unsalted nuts or seeds. Unsalted canned beans. Nut butters without added sugar or oil. Dairy  Low-fat or nonfat dairy products, such as skim or 1% milk, 2% or reduced-fat cheeses, low-fat and fat-free ricotta or cottage cheese, or plain low-fat and nonfat yogurt. Fats and oils  Tub margarine without trans fats. Light or reduced-fat mayonnaise and salad dressings. Avocado. Olive, canola, sesame, or safflower oils. The items listed above may not be a complete list of foods and beverages you can eat. Contact a dietitian for more information. Foods to avoid Fruits  Canned fruit in heavy syrup. Fruit in cream or butter sauce. Fried fruit. Vegetables  Vegetables cooked in cheese, cream, or butter sauce. Fried vegetables. Grains  White bread. White pasta. White rice. Cornbread. Bagels, pastries, and croissants. Crackers and snack foods that contain trans fat and hydrogenated oils. Meats and other protein foods  Fatty cuts of meat. Ribs, chicken wings, bacon, sausage, bologna, salami, chitterlings, fatback, hot dogs, bratwurst, and packaged lunch meats. Liver and organ meats. Whole eggs and egg yolks. Chicken and [place] with skin. Fried meat. Dairy  Whole or 2% milk, cream, half-and-half, and cream cheese. Whole milk cheeses.  Whole-fat or sweetened yogurt. Full-fat cheeses. Nondairy creamers and whipped toppings. Processed cheese, cheese spreads, and cheese curds. Beverages  Alcohol. Sugar-sweetened drinks such as sodas, lemonade, and fruit drinks. Fats and oils  Butter, stick margarine, lard, shortening, ghee, or bacon fat. Coconut, palm kernel, and palm oils. Sweets and desserts  Corn syrup, sugars, honey, and molasses. Candy. Jam and jelly. Syrup. Sweetened cereals. Cookies, pies, cakes, donuts, muffins, and ice cream. The items listed above may not be a complete list of foods and beverages you should avoid. Contact a dietitian for more information. Summary  Choosing the right foods helps keep your fat and cholesterol at normal levels. This can keep you from getting certain diseases.  At meals, fill one-half of your plate with vegetables and green salads.  Eat high-fiber foods, like whole grains, beans, apples, carrots, peas, and barley.  Limit added sugar, saturated fats, alcohol, and fried foods. This information is not intended to replace advice given to you by your health care provider. Make sure you discuss any questions you have with your health care provider. Document Revised: 10/04/2017 Document Reviewed: 10/18/2016 Elsevier Patient Education  [place].

## 2019-06-06 ENCOUNTER — Encounter: Payer: Self-pay | Admitting: Internal Medicine

## 2019-06-12 ENCOUNTER — Other Ambulatory Visit: Payer: Self-pay

## 2019-06-12 ENCOUNTER — Ambulatory Visit (INDEPENDENT_AMBULATORY_CARE_PROVIDER_SITE_OTHER): Payer: Medicare Other | Admitting: Pharmacist

## 2019-06-12 DIAGNOSIS — E785 Hyperlipidemia, unspecified: Secondary | ICD-10-CM

## 2019-06-12 DIAGNOSIS — I2782 Chronic pulmonary embolism: Secondary | ICD-10-CM | POA: Diagnosis not present

## 2019-06-12 NOTE — Progress Notes (Signed)
Patient ID: [name]                 DOB: 1945/06/09                    MRN: [id number]     HPI: [name] is a 74 y.o. female patient of Dr. [name], referred to lipid clinic by Nena Polio. 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 Patient has a history of elevated LFT recently. Muscle aches with statins.  Patient presents today to lipid clinic to discuss PCSK9i. Patient is very hesitant and states she is "only here for information." She has been in the hospital for a week at a time several times this past year. She states "I don't think I'll survive another hospitalization." Very concerned about trying new medications that might cause diarrhea as she will "end up in the hospital."  She asks several good questions about medications and side effects/interactions which were answered. States her diet has been poor, as she is finally able to eat and is eating a lot of sweets, hot dogs, chips. Wonders if her diet even matter as far as her cholesterol is concerned. Finally has put on weight and now is concerned about gaining too much weight.  She states that she needs to have her GI Dr. approved the medication, but doubts he will be in agreement.  Repatha and Praluent are both tier 2  Patient also asking about lower dose of Eliquis. Having increased bruising.  Current Medications: none Intolerances: pitavastatin, lovastatin, pravastatin (myalgias), ezetimibe (ineffective), cholestyramine Risk Factors: CAD, HTN LDL goal: <70  Diet: has been very poor per pt reports. Hot dogs from cookout, sweets, pastries  Exercise: walks  Family History:  The patient's family history includes Arthritis in an other family member; Asthma in her mother and another family member; Breast cancer in an  other family member; Breast cancer (age of onset: 69) in her maternal aunt; Heart disease in her mother and another family member; Hypertension in an other family member; Lung cancer in her father; Parkinson's disease in her maternal grandmother; Rheumatic fever in her maternal grandfather  Social History:   Labs: 06/03/19 TC 277, TG 137, HDL 78, LDL 171  Past Medical History:  Diagnosis Date  . ALLERGIC RHINITIS   . ANEMIA-NOS   . Arthritis   . ASTHMA   . Carpal tunnel syndrome   . COLONIC POLYPS, HX OF   . Coronary artery disease    "mild CAD" noted on 12/05/17 in coronary CT scan  . DIABETES MELLITUS, TYPE II    diet controlled  . GERD   . Hand tingling   . HYPERLIPIDEMIA   . HYPERTENSION   . Neuroendocrine tumor   . OSTEOPENIA   . PONV (postoperative nausea and vomiting)    " it relaxes my bladder muscles and I have been known to pee all over the place"  . Psoriasis    severe, began soriatane 01/2012  . Pulmonary embolism ([place])   . Rectal fissure   . Scoliosis   . Wears glasses     Current Outpatient Medications on File Prior to Visit  Medication Sig Dispense Refill  . apixaban (ELIQUIS) 5 MG TABS tablet Take 1 tablet (5 mg total) by mouth 2 (two) times daily. 60 tablet 5  .  Ascorbic Acid (VITAMIN C GUMMIE PO) Take 1 tablet by mouth every evening.    . Cholecalciferol (VITAMIN D3 GUMMIES ADULT PO) Take 1 tablet by mouth every evening.    . clindamycin (CLEOCIN T) 1 % external solution Apply 1 application topically daily as needed (wounds).     . clobetasol (TEMOVATE) 0.05 % external solution Apply 1 application topically every morning.     . Coenzyme Q10 (COQ10 GUMMIES ADULT PO) Take 1 tablet by mouth every evening.    Marland Kitchen CREON 12000 units CPEP capsule Take 12,000 Units by mouth 3 (three) times daily with meals.     . folic acid (FOLVITE) 1 MG tablet Take 1 tablet (1 mg total) by mouth daily. 30 tablet 5  . halobetasol (ULTRAVATE) 0.05 % cream Apply 1 application  topically daily. After bathing    . hydrocortisone 2.5 % cream Apply topically 2 (two) times daily. 30 g 5  . lisinopril (ZESTRIL) 10 MG tablet Take 1 tablet (10 mg total) by mouth daily. 30 tablet 5  . loratadine (CLARITIN) 10 MG tablet Take 10 mg by mouth daily as needed for allergies.    . Magnesium Oxide 400 MG CAPS Take 1 capsule (400 mg total) by mouth daily. 90 capsule 1  . Multiple Vitamins-Minerals (ADULT GUMMY PO) Take 1 tablet by mouth every evening.    . nebivolol (BYSTOLIC) 2.5 MG tablet TAKE ONE (1) TABLET BY MOUTH EVERY DAY 30 tablet 5  . omeprazole (PRILOSEC) 20 MG capsule Take 1 capsule (20 mg total) by mouth daily. 60 capsule 0  . ONETOUCH ULTRA test strip USE TEST STRIPS TO CHECK BLOOD SUGAR AT LEAST 3 TIMES DAILY 300 strip 1  . oxymetazoline (AFRIN) 0.05 % nasal spray Place 1 spray into both nostrils 2 (two) times daily.    Vladimir Faster Glycol-Propyl Glycol (LUBRICANT EYE DROPS) 0.4-0.3 % SOLN Place 1 drop into both eyes daily as needed (dry/irritated eyes.).    Marland Kitchen vitamin B-12 (CYANOCOBALAMIN) 1000 MCG tablet Take 1,000 mcg by mouth daily.     No current facility-administered medications on file prior to visit.    Allergies  Allergen Reactions  . Phenergan [Promethazine Hcl] Shortness Of Breath and Anxiety  . Amlodipine Swelling  . Clarithromycin Nausea And Vomiting    May have episodes of syncope due to prolonged vomiting   . Codeine Nausea And Vomiting    May have episodes of syncope due to prolonged vomiting  . Erythromycin Nausea And Vomiting  . Hydralazine Hcl     Drug-induced lupus - pt wishes to avoid  . Statins     Muscle pain   . Tetracycline Nausea And Vomiting    Assessment/Plan:  1. Hyperlipidemia - LDL is above goal of <70. Patient would benefit from a [facility] prospective from [facility]. Reviewed side effects of diarrhea, injection site reaction, allergic reaction, muscle aches and runny nose with patient. Advised very low to no risk of LFT elevations. If  side effects were to occur could take 2 weeks to clear. GI side effects in 3-6% of people. Discussed with patient that she will need to weight the potential benefit to her vs risks of side effects for her personally. We discussed that lowering cholesterol is just one way we lower cardiovascular risk. A healthy diet is extremely important in overall health. Pointed out that her poor diet is already having a negative impact on her blood sugars. (increase in A1C). Patient will discuss PCSK9i with GI Dr., Dr. Earlean Shawl and will let  us know.  2. 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 She states she was on Xarelto and then switched to Eliquis. She states that she bleeds/bruises more on Eliquis. Wants to know if there is a lower dose. We did advise that for long term use for prevention of VTE, her provider could consider decreasing to Eliquis 2.5mg  BID. Currently this medication is being prescribed by Dr. Quay Burow, but she wanted to discucss personally with Dr. [name]. I offered to send message to both Dr. Quay Burow and to Dr. [name] but she stated she had to discuss personally, face to face with Dr. [name].   Thank you,  Ramond Dial, Pharm.D, BCPS, CPP [facility]  [address], [place], [place] [address]  Phone: [phone]; Fax: [phone]

## 2019-07-01 ENCOUNTER — Other Ambulatory Visit (HOSPITAL_COMMUNITY): Payer: Self-pay | Admitting: *Deleted

## 2019-08-09 ENCOUNTER — Other Ambulatory Visit: Payer: Self-pay | Admitting: Internal Medicine

## 2019-08-13 ENCOUNTER — Encounter: Payer: Self-pay | Admitting: Cardiology

## 2019-08-13 ENCOUNTER — Other Ambulatory Visit: Payer: Self-pay

## 2019-08-13 ENCOUNTER — Ambulatory Visit (INDEPENDENT_AMBULATORY_CARE_PROVIDER_SITE_OTHER): Payer: Medicare Other | Admitting: Cardiology

## 2019-08-13 VITALS — BP 150/88 | HR 73 | Ht 61.0 in | Wt 138.2 lb

## 2019-08-13 DIAGNOSIS — I2782 Chronic pulmonary embolism: Secondary | ICD-10-CM | POA: Diagnosis not present

## 2019-08-13 DIAGNOSIS — Z86711 Personal history of pulmonary embolism: Secondary | ICD-10-CM | POA: Diagnosis not present

## 2019-08-13 DIAGNOSIS — Z79899 Other long term (current) drug therapy: Secondary | ICD-10-CM

## 2019-08-13 DIAGNOSIS — Z7189 Other specified counseling: Secondary | ICD-10-CM

## 2019-08-13 DIAGNOSIS — I10 Essential (primary) hypertension: Secondary | ICD-10-CM | POA: Diagnosis not present

## 2019-08-13 DIAGNOSIS — I2699 Other pulmonary embolism without acute cor pulmonale: Secondary | ICD-10-CM | POA: Diagnosis not present

## 2019-08-13 NOTE — Patient Instructions (Signed)
Medication Instructions:   Your physician recommends that you continue on your current medications as directed. Please refer to the Current Medication list given to you today.  *If you need a refill on your cardiac medications before your next appointment, please call your pharmacy*   You have been referred to HEMATOLOGY FOR HISTORY OF PULMONARY EMBOLISM AND DISCUSSION OF MEDICATION MANAGEMENT AND IF CHRONIC ANTICOAGULATION WILL BE NEEDED BASED ON PE's.    Follow-Up: At [facility], you and your health needs are our priority.  As part of our continuing mission to provide you with exceptional heart care, we have created designated Provider Care Teams.  These Care Teams include your primary Cardiologist (physician) and Advanced Practice Providers (APPs -  Physician Assistants and Nurse Practitioners) who all work together to provide you with the care you need, when you need it.  We recommend signing up for the patient portal called "MyChart".  Sign up information is provided on this After Visit Summary.  MyChart is used to connect with patients for Virtual Visits (Telemedicine).  Patients are able to view lab/test results, encounter notes, upcoming appointments, etc.  Non-urgent messages can be sent to your provider as well.   To learn more about what you can do with MyChart, go to [email or web address].    Your next appointment:   6 month(s)  The format for your next appointment:   In Person  Provider:   Ena Dawley, MD

## 2019-08-13 NOTE — Progress Notes (Signed)
Cardiology Office Note:    Date:  08/14/2019   ID:  [name], DOB 20-Mar-1945, MRN [id number]  PCP:  [name], MD  Cardiologist:  Ena Dawley, MD   Referring MD: [name], MD   Chief complain: Shortness of breath, uncontrolled hypertension, hyperlipidemia  History of Present Illness:    [name] is a 74 y.o. female with a hx of difficult to control hypertension, hyperlipidemia, who is coming for concern of worsening shortness of breath on exertion, she has been having difficulties controlling her blood pressure, now managed well by Dr. Quay Burow, she was previously on amlodipine that caused lower extremity edema, her blood pressure could be as high as 200, also any stool low she gets dizzy and she was experiencing falls. She has recently underwent brain MRI that showed progressive microvascular disease. She underwent coronary CTA that showed mild nonobstructive CAD.  She was started on pitavastatin 1 mg, she previously did not tolerate pravastatin and atorvastatin.  She developed hip pain and back pain and discontinued pitavastatin with some improvement of symptoms, she is willing to retry again.  She was also found to have a lung mass in her left lower lobe, full chest CT showed multiple other masses however PET CT was negative for malignancy.   She has been hospitalized twice in 2020 and 2021 with intractable chronic diarrhea nausea and vomiting, she has been tested excessively including Salmonella, Shigella, Giardia, C. difficile with all the tests negative.  She continues to have mild diarrhea, she has lost over 10 pounds in 1 month.  She underwent colonoscopy without any bleeding, she discontinued her cholesterol medications, she was started on hydralazine.  There was a consideration for neuro endocrine tumor as a reason for her symptoms of diarrhea and weight loss however exploratory laparotomy did not identify any mass.  08/13/2019 - since the last visit  she has been doing well from cardiac standpoint. Lower lower extremity edema has resolved with Lasix in the past, no edema recently.  She denies any chest pain or shortness of breath.  No palpitation dizziness or syncope.  She complains of easy bruising and very dry frail skin that breaks easily, her Eliquis was changed to Xarelto however that has not changed her symptoms.  Past Medical History:  Diagnosis Date  . ALLERGIC RHINITIS   . ANEMIA-NOS   . Arthritis   . ASTHMA   . Carpal tunnel syndrome   . COLONIC POLYPS, HX OF   . Coronary artery disease    "mild CAD" noted on 12/05/17 in coronary CT scan  . DIABETES MELLITUS, TYPE II    diet controlled  . GERD   . Hand tingling   . HYPERLIPIDEMIA   . HYPERTENSION   . Neuroendocrine tumor   . OSTEOPENIA   . PONV (postoperative nausea and vomiting)    " it relaxes my bladder muscles and I have been known to pee all over the place"  . Psoriasis    severe, began soriatane 01/2012  . Pulmonary embolism ([place])   . Rectal fissure   . Scoliosis   . Wears glasses     Past Surgical History:  Procedure Laterality Date  . benign rectal growth  2004   removed by Dr. Zella Richer  . BIOPSY  08/09/2018   Procedure: BIOPSY;  Surgeon: Carol Ada, MD;  Location: [place];  Service: Endoscopy;;  . BIOPSY  08/22/2018   Procedure: BIOPSY;  Surgeon: Juanita Craver, MD;  Location: [place] ENDOSCOPY;  Service: Endoscopy;;  . BIOPSY  03/07/2019   Procedure: BIOPSY;  Surgeon: Rush Landmark Telford Nab., MD;  Location: [place];  Service: Gastroenterology;;  . BREAST BIOPSY Right   . BREAST EXCISIONAL BIOPSY Left   . BREAST SURGERY  1988   biopsy  . CESAREAN SECTION    . COLONOSCOPY N/A 08/22/2018   Procedure: COLONOSCOPY;  Surgeon: Juanita Craver, MD;  Location[facility]COPY;  Service: Endoscopy;  Laterality: N/A;  . ENDOSCOPIC MUCOSAL RESECTION N/A 03/07/2019   Procedure: ENDOSCOPIC MUCOSAL RESECTION;  Surgeon: Rush Landmark Telford Nab., MD;  Location: [place];  Service: Gastroenterology;  Laterality: N/A;  . ESOPHAGOGASTRODUODENOSCOPY (EGD) WITH PROPOFOL N/A 08/04/2018   Procedure: ESOPHAGOGASTRODUODENOSCOPY (EGD) WITH PROPOFOL;  Surgeon: Ladene Artist, MD;  Location: [place];  Service: Gastroenterology;  Laterality: N/A;  . ESOPHAGOGASTRODUODENOSCOPY (EGD) WITH PROPOFOL N/A 03/07/2019   Procedure: ESOPHAGOGASTRODUODENOSCOPY (EGD) WITH PROPOFOL;  Surgeon: Rush Landmark Telford Nab., MD;  Location: [place];  Service: Gastroenterology;  Laterality: N/A;  . EUS N/A 03/07/2019   Procedure: UPPER ENDOSCOPIC ULTRASOUND (EUS) RADIAL;  Surgeon: Rush Landmark Telford Nab., MD;  Location: [place];  Service: Gastroenterology;  Laterality: N/A;  . FINE NEEDLE ASPIRATION  03/07/2019   Procedure: FINE NEEDLE ASPIRATION (FNA) LINEAR;  Surgeon: Irving Copas., MD;  Location: [place];  Service: Gastroenterology;;  . Otho Darner SIGMOIDOSCOPY N/A 08/04/2018   Procedure: FLEXIBLE SIGMOIDOSCOPY;  Surgeon: Ladene Artist, MD;  Location: [place];  Service: Gastroenterology;  Laterality: N/A;  . FLEXIBLE SIGMOIDOSCOPY N/A 08/09/2018   Procedure: FLEXIBLE SIGMOIDOSCOPY;  Surgeon: Carol Ada, MD;  Location: [place];  Service: Endoscopy;  Laterality: N/A;  . HEMOSTASIS CLIP PLACEMENT  03/07/2019   Procedure: HEMOSTASIS CLIP PLACEMENT;  Surgeon: Irving Copas., MD;  Location: [place];  Service: Gastroenterology;;  . Lia Foyer LIFTING INJECTION  03/07/2019   Procedure: SUBMUCOSAL LIFTING INJECTION;  Surgeon: Irving Copas., MD;  Location: [place];  Service: Gastroenterology;;  . VIDEO BRONCHOSCOPY WITH ENDOBRONCHIAL NAVIGATION N/A 03/14/2018   Procedure: VIDEO BRONCHOSCOPY WITH ENDOBRONCHIAL NAVIGATION;  Surgeon: Collene Gobble, MD;  Location: [facility];  Service: Thoracic;  Laterality: N/A;   Current Medications: Current Meds  Medication Sig  . apixaban (ELIQUIS) 5 MG TABS tablet Take 1 tablet (5 mg total) by  mouth 2 (two) times daily.  . Ascorbic Acid (VITAMIN C GUMMIE PO) Take 1 tablet by mouth every evening.  . Cholecalciferol (VITAMIN D3 GUMMIES ADULT PO) Take 1 tablet by mouth every evening.  . clindamycin (CLEOCIN T) 1 % external solution Apply 1 application topically daily as needed (wounds).   . clobetasol (TEMOVATE) 0.05 % external solution Apply 1 application topically every morning.   . Coenzyme Q10 (COQ10 GUMMIES ADULT PO) Take 1 tablet by mouth every evening.  Marland Kitchen CREON 12000 units CPEP capsule Take 12,000 Units by mouth 5 (five) times daily.   . folic acid (FOLVITE) 1 MG tablet Take 1 tablet (1 mg total) by mouth daily.  . halobetasol (ULTRAVATE) 0.05 % cream Apply 1 application topically daily. After bathing  . lisinopril (ZESTRIL) 10 MG tablet Take 1 tablet (10 mg total) by mouth daily.  Marland Kitchen loratadine (CLARITIN) 10 MG tablet Take 10 mg by mouth daily as needed for allergies.  . Magnesium Oxide 400 MG CAPS Take 1 capsule (400 mg total) by mouth daily.  . Multiple Vitamins-Minerals (ADULT GUMMY PO) Take 1 tablet by mouth every evening.  . nebivolol (BYSTOLIC) 2.5 MG tablet TAKE ONE (1) TABLET BY MOUTH EVERY DAY  . omeprazole (  PRILOSEC) 20 MG capsule Take 1 capsule (20 mg total) by mouth daily.  [name] test strip USE TEST STRIPS TO CHECK BLOOD SUGAR AT LEAST 3 TIMES DAILY  . oxymetazoline (AFRIN) 0.05 % nasal spray Place 1 spray into both nostrils 2 (two) times daily.  Vladimir Faster Glycol-Propyl Glycol (LUBRICANT EYE DROPS) 0.4-0.3 % SOLN Place 1 drop into both eyes daily as needed (dry/irritated eyes.).  Marland Kitchen vitamin B-12 (CYANOCOBALAMIN) 1000 MCG tablet Take 1,000 mcg by mouth daily.    Allergies:   Phenergan [promethazine hcl], Amlodipine, Clarithromycin, Codeine, Erythromycin, Hydralazine hcl, Statins, and Tetracycline   Social History   Socioeconomic History  . Marital status: Single    Spouse name: Not on file  . Number of children: 1  . Years of education: 47  . Highest  education level: Bachelor's degree (e.g., BA, AB, BS)  Occupational History  . Occupation: retired Careers adviser  Tobacco Use  . Smoking status: Former Smoker    Packs/day: 1.00    Years: 10.00    Pack years: 10.00    Quit date: 02/14/1974    Years since quitting: 45.5  . Smokeless tobacco: Never Used  Vaping Use  . Vaping Use: Never used  Substance and Sexual Activity  . Alcohol use: No  . Drug use: No  . Sexual activity: Not on file  Other Topics Concern  . Not on file  Social History Narrative   Lives alone in a one story home.  Has one child and one grandchild.  Retired Careers adviser.  Education: college.    Social Determinants of Health   Financial Resource Strain:   . Difficulty of Paying Living Expenses:   Food Insecurity:   . Worried About Charity fundraiser in the Last Year:   . Arboriculturist in the Last Year:   Transportation Needs:   . Film/video editor (Medical):   Marland Kitchen Lack of Transportation (Non-Medical):   Physical Activity:   . Days of Exercise per Week:   . Minutes of Exercise per Session:   Stress:   . Feeling of Stress :   Social Connections:   . Frequency of Communication with Friends and Family:   . Frequency of Social Gatherings with Friends and Family:   . Attends Religious Services:   . Active Member of Clubs or Organizations:   . Attends Archivist Meetings:   Marland Kitchen Marital Status:      Family History: The patient's family history includes Arthritis in an other family member; Asthma in her mother and another family member; Breast cancer in an other family member; Breast cancer (age of onset: 52) in her maternal aunt; Heart disease in her mother and another family member; Hypertension in an other family member; Lung cancer in her father; Parkinson's disease in her maternal grandmother; Rheumatic fever in her maternal grandfather.  ROS:   Please see the history of present  illness.    All other systems reviewed and are negative.  EKGs/Labs/Other Studies Reviewed:    The following studies were reviewed today:  EKG:  EKG is not ordered today.   Recent Labs: 06/03/2019: ALT 22; BUN 23; Creatinine, Ser 0.90; Hemoglobin 13.1; Magnesium 1.9; Platelets 349.0; Potassium 3.8; Sodium 138  Recent Lipid Panel    Component Value Date/Time   CHOL 277 (H) 06/03/2019 0845   CHOL 179 09/03/2018 0824   TRIG 137.0 06/03/2019 0845   HDL 78.60 06/03/2019 0845  HDL 42 09/03/2018 0824   CHOLHDL 4 06/03/2019 0845   VLDL 27.4 06/03/2019 0845   LDLCALC 171 (H) 06/03/2019 0845   LDLCALC 87 09/03/2018 0824   LDLDIRECT 165.0 04/30/2018 0908    Physical Exam:    VS:  BP (!) 150/88   Pulse 73   Ht _0  (1.549 m)   Wt 138 lb 3.2 oz (62.7 kg)   SpO2 98%   BMI 26.11 kg/m     Wt Readings from Last 3 Encounters:  08/13/19 138 lb 3.2 oz (62.7 kg)  06/04/19 123 lb 12.8 oz (56.2 kg)  06/03/19 123 lb (55.8 kg)    GEN: Well nourished, well developed in no acute distress HEENT: Normal NECK: No JVD; No carotid bruits LYMPHATICS: No lymphadenopathy CARDIAC: RRR, no murmurs, rubs, gallops RESPIRATORY:  Clear to auscultation without rales, wheezing or rhonchi  ABDOMEN: Soft, non-tender, non-distended MUSCULOSKELETAL: Minimal bilateral edema around her ankles, significant erythema in bilateral toes but good pulses.   SKIN: Warm and dry NEUROLOGIC:  Alert and oriented x 3 PSYCHIATRIC:  Normal affect   CT chest: 12/22/2017  1. In addition to the previously noted mass in the left lower lobe there several other smaller irregular-shaped nodules scattered throughout the lungs bilaterally. The irregular shape of the nodules would be unusual for metastatic disease. The possibility of a benign etiology such as multifocal bronchoceles should be considered, but is a diagnosis of exclusion. Malignancy should be excluded, and further evaluation with PET-CT is again suggested. 2.  Nonocclusive filling defect in the distal left main pulmonary artery compatible with pulmonary embolism. 3. Hepatic steatosis. 4. Aortic atherosclerosis, in addition to left main and left anterior descending coronary artery disease. Assessment for potential risk factor modification, dietary therapy or pharmacologic therapy may be warranted, if clinically indicated. 5. There are calcifications of the aortic valve. Echocardiographic correlation for evaluation of potential valvular dysfunction may be warranted if clinically indicated.  Coronary CTA: 12/05/2017  1. Coronary calcium score of 6. This was 51 percentile for age and sex matched control. 2. Normal coronary origin with right dominance. 3. There is mild CAD in the proximal LAD, aggressive risk factor modification is recommended SOM most probably secondary to obesity and deconditioning.  PET CT: 01/01/2018  1. For the most part the branching nodularity in the lungs is of low activity and likely benign. Given the somewhat branching tubular appearance, the possibility of multifocal bronchocele is certainly raised. The lesion along the left upper lobe paramediastinal area, and in the right upper lobe just adjacent to the fissure do have some low-grade activity (1.6 and 1.7, respectively) which may well be inflammatory but for which surveillance imaging in 6 months time would be suggested. 2. The larger lesion in the left lower lobe is not hypermetabolic and is likely benign. Bilateral tonsillar activity in the neck is likely benign. 3. No worrisome findings in the neck, abdomen/pelvis, or skeleton.   ASSESSMENT:    1. History of pulmonary embolus (PE)   2. Essential hypertension   3. Other chronic pulmonary embolism without acute cor pulmonale (HCC)   4. Pulmonary embolism without acute cor pulmonale, unspecified chronicity, unspecified pulmonary embolism type ([place])   5. Medication management   6. Encounter for anticoagulation  discussion and counseling    PLAN:    In order of problems listed above:  1. Pulmonary embolism -on Xarelto, we will continue, this might be lifelong given that it was unprovoked, possible LE injury with a fall shortly prior that.  She wants to be referred to hematology to see if she needs to continue taking anticoagulation lifelong.  2.  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  Followed by pulmonary.  3.  Mild nonobstructive CAD -and aortic atherosclerosis as well as microvascular disease on her brain imaging, she discontinued statins and does not want to start taking fish oil in the settings of chronic diarrhea and weight loss.  I agree with that. She is asymptomatic.  Her EKG today shows normal sinus rhythm, 69 bpm, normal EKG unchanged from prior.  4.  Hypertension -controlled with hydralazine.  5. Hyperlipidemia - as above.  6.  Lower extremity edema, sec to inactivity, resolved with short course of Lasix 20 mg daily in December 2020.  Medication Adjustments/Labs and Tests Ordered: Current medicines are reviewed at length with the patient today.  Concerns regarding medicines are outlined above.  Orders Placed This Encounter  Procedures  . Ambulatory referral to Hematology  . EKG 12-Lead   No orders of the defined types were placed in this encounter.   Patient Instructions  Medication Instructions:   Your physician recommends that you continue on your current medications as directed. Please refer to the Current Medication list given to you today.  *If you need a refill on your cardiac medications before your next appointment, please call your pharmacy*   You have been referred to HEMATOLOGY FOR HISTORY OF PULMONARY EMBOLISM AND DISCUSSION OF MEDICATION MANAGEMENT AND IF CHRONIC ANTICOAGULATION WILL BE NEEDED BASED ON PE's.    Follow-Up: At [facility], you and your health needs are our priority.  As part of our  continuing mission to provide you with exceptional heart care, we have created designated Provider Care Teams.  These Care Teams include your primary Cardiologist (physician) and Advanced Practice Providers (APPs -  Physician Assistants and Nurse Practitioners) who all work together to provide you with the care you need, when you need it.  We recommend signing up for the patient portal called "MyChart".  Sign up information is provided on this After Visit Summary.  MyChart is used to connect with patients for Virtual Visits (Telemedicine).  Patients are able to view lab/test results, encounter notes, upcoming appointments, etc.  Non-urgent messages can be sent to your provider as well.   To learn more about what you can do with MyChart, go to [email or web address].    Your next appointment:   6 month(s)  The format for your next appointment:   In Person  Provider:   Ena Dawley, MD        Signed, Ena Dawley, MD  08/14/2019 1:11 PM    [place]

## 2019-08-14 ENCOUNTER — Telehealth: Payer: Self-pay | Admitting: *Deleted

## 2019-08-14 ENCOUNTER — Telehealth: Payer: Self-pay | Admitting: Pharmacist

## 2019-08-14 ENCOUNTER — Telehealth: Payer: Self-pay | Admitting: Medical Oncology

## 2019-08-14 DIAGNOSIS — E785 Hyperlipidemia, unspecified: Secondary | ICD-10-CM

## 2019-08-14 MED ORDER — REPATHA SURECLICK 140 MG/ML ~~LOC~~ SOAJ: 1.0000 "pen " | SUBCUTANEOUS | 11 refills | Status: DC

## 2019-08-14 NOTE — Telephone Encounter (Signed)
[name] is not sure if he need to see the patient again. "She can continue her current treatment with Xarelto and follow-up with her primary care physician and cardiology because of the risk of blood clots in addition was suspicious for cancer".

## 2019-08-14 NOTE — Telephone Encounter (Signed)
Thank you so much

## 2019-08-14 NOTE — Telephone Encounter (Signed)
:   established pt Received: Today [place], Leodis Liverpool, LPN Please see Dr. Worthy Flank msg below       Previous Messages   ----- Message -----  From: Curt Bears, MD  Sent: 08/14/2019 10:51 AM EDT  To: Ardeen Garland, RN, Nena Polio  Subject: RE: established pt                I am not sure if we need to see the patient again. She can continue her current treatment with Xarelto and follow-up with her primary care physician and cardiology because of the risk of blood clots in addition was suspicious for cancer. Thank you.  ----- Message -----  From: Nena Polio  Sent: 08/14/2019  9:49 AM EDT  To: Curt Bears, MD, Ardeen Garland, RN  Subject: established pt                  Good morning,   Ms. Dault is being referred back for hx of pe, last seen in January 2021. Please fwd an appt to scheduling.   Thanks  Seth Bake

## 2019-08-14 NOTE — Telephone Encounter (Signed)
Received message from Dr [name] regarding [id number] therapy for pt. She was previously seen in lipid clinic 06/12/19 and wanted to discuss [id number] therapy with her GI doctor first. Pt states she spoke with him recently and GI is fine with her starting on [place].  Baseline LDL 171 above goal < 70 due to CAD. Intolerant to 3 statins, Zetia, and Welchol.  Submitted prior authorization for Repatha which was approved through 02/14/20. Discussed injection technique over the phone, pt has previously used biologic medications and is familiar with technique. Advised her to call with any concerns. She is ok with $45/month copay. Scheduled f/u labs in Sept to assess efficacy.

## 2019-08-14 NOTE — Telephone Encounter (Signed)
This referral message was sent to Dr. [name] as a general FYI.

## 2019-08-20 ENCOUNTER — Other Ambulatory Visit: Payer: Self-pay | Admitting: Internal Medicine

## 2019-08-20 DIAGNOSIS — Q438 Other specified congenital malformations of intestine: Secondary | ICD-10-CM

## 2019-08-27 ENCOUNTER — Other Ambulatory Visit: Payer: Self-pay | Admitting: Internal Medicine

## 2019-08-30 ENCOUNTER — Other Ambulatory Visit: Payer: Medicare Other

## 2019-09-02 ENCOUNTER — Encounter: Payer: Self-pay | Admitting: Family

## 2019-09-02 ENCOUNTER — Ambulatory Visit (INDEPENDENT_AMBULATORY_CARE_PROVIDER_SITE_OTHER): Payer: Medicare Other | Admitting: Family

## 2019-09-02 ENCOUNTER — Other Ambulatory Visit: Payer: Self-pay

## 2019-09-02 VITALS — BP 162/84 | HR 74 | Temp 98.5°F | Ht 61.0 in | Wt 141.0 lb

## 2019-09-02 DIAGNOSIS — R300 Dysuria: Secondary | ICD-10-CM

## 2019-09-02 LAB — POC URINALSYSI DIPSTICK (AUTOMATED)
Bilirubin, UA: NEGATIVE
Glucose, UA: NEGATIVE
Ketones, UA: POSITIVE
Nitrite, UA: POSITIVE
Protein, UA: NEGATIVE
Spec Grav, UA: 1.015 (ref 1.010–1.025)
Urobilinogen, UA: NEGATIVE E.U./dL — AB
pH, UA: 6 (ref 5.0–8.0)

## 2019-09-02 MED ORDER — LISINOPRIL 10 MG PO TABS: 10.0000 mg | ORAL_TABLET | Freq: Every day | ORAL | 5 refills | Status: DC

## 2019-09-02 MED ORDER — NITROFURANTOIN MONOHYD MACRO 100 MG PO CAPS: 100.0000 mg | ORAL_CAPSULE | Freq: Two times a day (BID) | ORAL | 0 refills | Status: DC

## 2019-09-02 NOTE — Progress Notes (Signed)
[name] is a 74 y.o. female with the following history as recorded in EpicCare:  Patient Active Problem List   Diagnosis Date Noted  . Folic acid deficiency 09/38/1829  . Bilateral leg edema 03/19/2019  . Hemorrhoids 03/19/2019  . B12 deficiency 03/19/2019  . Abnormal CT of the abdomen 02/27/2019  . Abnormal endoscopy of upper gastrointestinal tract 02/27/2019  . Neuroendocrine tumor 02/27/2019  . Iron deficiency anemia 02/19/2019  . Protein-calorie malnutrition, severe 01/31/2019  . Hypomagnesemia   . AKI (acute kidney injury) ([place]) 01/30/2019  . Inflammatory arthritis 01/02/2019  . Positive ANA (antinuclear antibody) 01/02/2019  . Exocrine pancreatic insufficiency 10/23/2018  . Multiple pulmonary nodules determined by computed tomography of lung 09/18/2018  . Decreased GFR 08/21/2018  . Nausea vomiting and diarrhea 08/20/2018  . Syncope 08/01/2018  . Hypokalemia 08/01/2018  . Abnormal CT scan, pelvis 07/31/2018  . Pneumothorax 03/14/2018  . Lung abnormality 01/11/2018  . Pulmonary embolism ([place]) 12/24/2017  . Lung mass 12/24/2017  . Small vessel disease, cerebrovascular 07/31/2017  . Atypical chest pain 06/14/2017  . Dizziness 06/14/2017  . Hyperuricemia 05/03/2017  . Arthralgia 05/01/2017  . Hair loss 05/01/2017  . Vitamin D deficiency 10/31/2016  . Diarrhea 06/06/2016  . Diastolic dysfunction 93/71/6967  . Vertigo 12/24/2015  . Nonspecific abnormal electrocardiogram (ECG) (EKG) 12/24/2015  . Bilateral carotid artery disease, Mild 05/31/2015  . Thyroid nodule 05/31/2015  . Psoriasis   . Scoliosis   . Diabetes type 2, controlled ([place]) 06/03/2008  . Dyslipidemia 06/03/2008  . CARPAL TUNNEL SYNDROME 06/03/2008  . HTN (hypertension) 06/03/2008  . ALLERGIC RHINITIS 06/03/2008  . Asthma 06/03/2008  . GERD (gastroesophageal reflux disease) 06/03/2008  . Osteopenia 06/03/2008  . COLONIC POLYPS, HX OF 06/03/2008    Current Outpatient Medications   Medication Sig Dispense Refill  . apixaban (ELIQUIS) 5 MG TABS tablet Take 1 tablet (5 mg total) by mouth 2 (two) times daily. 60 tablet 5  . Ascorbic Acid (VITAMIN C GUMMIE PO) Take 1 tablet by mouth every evening.    . Cholecalciferol (VITAMIN D3 GUMMIES ADULT PO) Take 1 tablet by mouth every evening.    . clindamycin (CLEOCIN T) 1 % external solution Apply 1 application topically daily as needed (wounds).     . clobetasol (TEMOVATE) 0.05 % external solution Apply 1 application topically every morning.     . Coenzyme Q10 (COQ10 GUMMIES ADULT PO) Take 1 tablet by mouth every evening.    Marland Kitchen CREON 12000 units CPEP capsule Take 12,000 Units by mouth 5 (five) times daily.     . Evolocumab (REPATHA SURECLICK) 893 MG/ML SOAJ Inject 1 pen into the skin every 14 (fourteen) days. 2 pen 11  . folic acid (FOLVITE) 1 MG tablet Take 1 tablet (1 mg total) by mouth daily. 30 tablet 5  . halobetasol (ULTRAVATE) 0.05 % cream Apply 1 application topically daily. After bathing    . lisinopril (ZESTRIL) 10 MG tablet Take 1 tablet (10 mg total) by mouth daily. 30 tablet 5  . loratadine (CLARITIN) 10 MG tablet Take 10 mg by mouth daily as needed for allergies.    . magnesium oxide (MAG-OX) 400 MG tablet TAKE 1 TABLET BY MOUTH EVERY DAY 90 tablet 1  . Multiple Vitamins-Minerals (ADULT GUMMY PO) Take 1 tablet by mouth every evening.    . nebivolol (BYSTOLIC) 2.5 MG tablet TAKE ONE (1) TABLET BY MOUTH EVERY DAY 30 tablet 5  . omeprazole (PRILOSEC) 20 MG capsule Take 1 capsule (20 mg total)  by mouth daily. 60 capsule 0  . ONETOUCH ULTRA test strip USE TEST STRIPS TO CHECK BLOOD SUGAR AT LEAST 3 TIMES DAILY 300 strip 1  . oxymetazoline (AFRIN) 0.05 % nasal spray Place 1 spray into both nostrils 2 (two) times daily.    Vladimir Faster Glycol-Propyl Glycol (LUBRICANT EYE DROPS) 0.4-0.3 % SOLN Place 1 drop into both eyes daily as needed (dry/irritated eyes.).    Marland Kitchen vitamin B-12 (CYANOCOBALAMIN) 1000 MCG tablet Take 1,000 mcg by  mouth daily.    . nitrofurantoin, macrocrystal-monohydrate, (MACROBID) 100 MG capsule Take 1 capsule (100 mg total) by mouth 2 (two) times daily. 14 capsule 0   No current facility-administered medications for this visit.    Allergies: Phenergan [promethazine hcl], Amlodipine, Clarithromycin, Codeine, Erythromycin, Hydralazine hcl, Statins, and Tetracycline  Past Medical History:  Diagnosis Date  . ALLERGIC RHINITIS   . ANEMIA-NOS   . Arthritis   . ASTHMA   . Carpal tunnel syndrome   . COLONIC POLYPS, HX OF   . Coronary artery disease    "mild CAD" noted on 12/05/17 in coronary CT scan  . DIABETES MELLITUS, TYPE II    diet controlled  . GERD   . Hand tingling   . HYPERLIPIDEMIA   . HYPERTENSION   . Neuroendocrine tumor   . OSTEOPENIA   . PONV (postoperative nausea and vomiting)    " it relaxes my bladder muscles and I have been known to pee all over the place"  . Psoriasis    severe, began soriatane 01/2012  . Pulmonary embolism ([place])   . Rectal fissure   . Scoliosis   . Wears glasses     Past Surgical History:  Procedure Laterality Date  . benign rectal growth  2004   removed by Dr. Zella Richer  . BIOPSY  08/09/2018   Procedure: BIOPSY;  Surgeon: Carol Ada, MD;  Location: [facility];  Service: Endoscopy;;  . BIOPSY  08/22/2018   Procedure: BIOPSY;  Surgeon: Juanita Craver, MD;  Location: [facility];  Service: Endoscopy;;  . BIOPSY  03/07/2019   Procedure: BIOPSY;  Surgeon: Irving Copas., MD;  Location: [facility];  Service: Gastroenterology;;  . BREAST BIOPSY Right   . BREAST EXCISIONAL BIOPSY Left   . BREAST SURGERY  1988   biopsy  . CESAREAN SECTION    . COLONOSCOPY N/A 08/22/2018   Procedure: COLONOSCOPY;  Surgeon: Juanita Craver, MD;  Location: [facility] ENDOSCOPY;  Service: Endoscopy;  Laterality: N/A;  . ENDOSCOPIC MUCOSAL RESECTION N/A 03/07/2019   Procedure: ENDOSCOPIC MUCOSAL RESECTION;  Surgeon: Rush Landmark Telford Nab., MD;  Location: [facility];   Service: Gastroenterology;  Laterality: N/A;  . ESOPHAGOGASTRODUODENOSCOPY (EGD) WITH PROPOFOL N/A 08/04/2018   Procedure: ESOPHAGOGASTRODUODENOSCOPY (EGD) WITH PROPOFOL;  Surgeon: Ladene Artist, MD;  Location: [facility];  Service: Gastroenterology;  Laterality: N/A;  . ESOPHAGOGASTRODUODENOSCOPY (EGD) WITH PROPOFOL N/A 03/07/2019   Procedure: ESOPHAGOGASTRODUODENOSCOPY (EGD) WITH PROPOFOL;  Surgeon: Rush Landmark Telford Nab., MD;  Location: [place];  Service: Gastroenterology;  Laterality: N/A;  . EUS N/A 03/07/2019   Procedure: UPPER ENDOSCOPIC ULTRASOUND (EUS) RADIAL;  Surgeon: Rush Landmark Telford Nab., MD;  Location: [facility];  Service: Gastroenterology;  Laterality: N/A;  . FINE NEEDLE ASPIRATION  03/07/2019   Procedure: FINE NEEDLE ASPIRATION (FNA) LINEAR;  Surgeon: Irving Copas., MD;  Location: [place];  Service: Gastroenterology;;  . Otho Darner SIGMOIDOSCOPY N/A 08/04/2018   Procedure: FLEXIBLE SIGMOIDOSCOPY;  Surgeon: Ladene Artist, MD;  Location: [facility];  Service: Gastroenterology;  Laterality: N/A;  .  FLEXIBLE SIGMOIDOSCOPY N/A 08/09/2018   Procedure: FLEXIBLE SIGMOIDOSCOPY;  Surgeon: Carol Ada, MD;  Location: [place];  Service: Endoscopy;  Laterality: N/A;  . HEMOSTASIS CLIP PLACEMENT  03/07/2019   Procedure: HEMOSTASIS CLIP PLACEMENT;  Surgeon: Irving Copas., MD;  Location: [facility];  Service: Gastroenterology;;  . Lia Foyer LIFTING INJECTION  03/07/2019   Procedure: SUBMUCOSAL LIFTING INJECTION;  Surgeon: Irving Copas., MD;  Location: [facility];  Service: Gastroenterology;;  . VIDEO BRONCHOSCOPY WITH ENDOBRONCHIAL NAVIGATION N/A 03/14/2018   Procedure: VIDEO BRONCHOSCOPY WITH ENDOBRONCHIAL NAVIGATION;  Surgeon: Collene Gobble, MD;  Location: [facility];  Service: Thoracic;  Laterality: N/A;    Family History  Problem Relation Age of Onset  . Lung cancer Father   . Arthritis Other        Parents  . Asthma Other         parent, other relative  . Breast cancer Other        other relative  . Hypertension Other        parent, other relative  . Heart disease Other        parent, other relative  . Heart disease Mother   . Asthma Mother   . Breast cancer Maternal Aunt 68  . Parkinson's disease Maternal Grandmother   . Rheumatic fever Maternal Grandfather     Social History   Tobacco Use  . Smoking status: Former Smoker    Packs/day: 1.00    Years: 10.00    Pack years: 10.00    Quit date: 02/14/1974    Years since quitting: 45.5  . Smokeless tobacco: Never Used  Substance Use Topics  . Alcohol use: No    Subjective:  Presents with concerns for UTI; started suddenly on Saturday with burning/ frequency; has had some incontinence; no fever or blood in urine; has never had a UTI; is hesitant to use Sulfa drugs to treat her daughter and mother both have allergic reaction.  Also requesting refill on her Lisinopril for her hypertension; Is scheduled for CT on Wednesday per her GI- does not appear to have updated kidney functions in system- will update today;    Objective:  Vitals:   09/02/19 1047  BP: (!) 162/84  Pulse: 74  Temp: 98.5 F (36.9 C)  TempSrc: Oral  SpO2: 96%  Weight: 141 lb (64 kg)  Height: 5\' 1"  (1.549 m)    General: Well developed, well nourished, in no acute distress  Skin : Warm and dry.  Head: Normocephalic and atraumatic  Lungs: Respirations unlabored;  CVS exam: normal rate and regular rhythm.  Neurologic: Alert and oriented; speech intact; face symmetrical; moves all extremities well; CNII-XII intact without focal deficit   Assessment:  1. Dysuria     Plan:  Suspect UTI; update urine culture; Rx for Macrobid 100 mg bid x 7 days; increase fluids, rest and follow up worse, no better.  This visit occurred during the SARS-CoV-2 public health emergency.  Safety protocols were in place, including screening questions prior to the visit, additional usage of staff PPE, and  extensive cleaning of exam room while observing appropriate contact time as indicated for disinfecting solutions.     No follow-ups on file.  Orders Placed This Encounter  Procedures  . Urine Culture    Standing Status:   Future    Number of Occurrences:   1    Standing Expiration Date:   09/01/2020  . Basic Metabolic Panel (BMET)    Standing Status:   Future  Number of Occurrences:   1    Standing Expiration Date:   09/01/2020  . POCT Urinalysis Dipstick (Automated)    Requested Prescriptions   Signed Prescriptions Disp Refills  . lisinopril (ZESTRIL) 10 MG tablet 30 tablet 5    Sig: Take 1 tablet (10 mg total) by mouth daily.  . nitrofurantoin, macrocrystal-monohydrate, (MACROBID) 100 MG capsule 14 capsule 0    Sig: Take 1 capsule (100 mg total) by mouth 2 (two) times daily.

## 2019-09-03 LAB — BASIC METABOLIC PANEL
BUN/Creatinine Ratio: 17 (calc) (ref 6–22)
BUN: 23 mg/dL (ref 7–25)
CO2: 30 mmol/L (ref 20–32)
Calcium: 10.1 mg/dL (ref 8.6–10.4)
Chloride: 100 mmol/L (ref 98–110)
Creat: 1.37 mg/dL — ABNORMAL HIGH (ref 0.60–0.93)
Glucose, Bld: 80 mg/dL (ref 65–99)
Potassium: 4.7 mmol/L (ref 3.5–5.3)
Sodium: 139 mmol/L (ref 135–146)

## 2019-09-03 LAB — URINE CULTURE

## 2019-09-04 ENCOUNTER — Other Ambulatory Visit: Payer: Medicare Other

## 2019-09-07 ENCOUNTER — Other Ambulatory Visit: Payer: Self-pay | Admitting: Internal Medicine

## 2019-09-09 ENCOUNTER — Other Ambulatory Visit: Payer: Self-pay | Admitting: Family

## 2019-09-09 ENCOUNTER — Telehealth: Payer: Self-pay

## 2019-09-09 MED ORDER — FLUCONAZOLE 150 MG PO TABS: ORAL_TABLET | ORAL | 0 refills | Status: DC

## 2019-09-09 NOTE — Telephone Encounter (Signed)
New message    Finished the antibiotic  seem to have a vaginal yeast infection asking can a prescription be called in - Fluconazole   [id number] - [place], [place] - [address] AT [address]   Decline appt today.

## 2019-09-11 ENCOUNTER — Inpatient Hospital Stay: Admission: RE | Admit: 2019-09-11 | Payer: Medicare Other | Source: Ambulatory Visit

## 2019-09-11 ENCOUNTER — Other Ambulatory Visit: Payer: Self-pay

## 2019-09-11 ENCOUNTER — Ambulatory Visit (INDEPENDENT_AMBULATORY_CARE_PROVIDER_SITE_OTHER): Payer: Medicare Other | Admitting: Internal Medicine

## 2019-09-11 ENCOUNTER — Encounter: Payer: Self-pay | Admitting: Internal Medicine

## 2019-09-11 VITALS — BP 150/78 | HR 70 | Temp 98.4°F | Ht 61.0 in | Wt 141.0 lb

## 2019-09-11 DIAGNOSIS — R300 Dysuria: Secondary | ICD-10-CM

## 2019-09-11 DIAGNOSIS — B373 Candidiasis of vulva and vagina: Secondary | ICD-10-CM | POA: Diagnosis not present

## 2019-09-11 DIAGNOSIS — B3731 Acute candidiasis of vulva and vagina: Secondary | ICD-10-CM

## 2019-09-11 DIAGNOSIS — N390 Urinary tract infection, site not specified: Secondary | ICD-10-CM | POA: Insufficient documentation

## 2019-09-11 DIAGNOSIS — N3001 Acute cystitis with hematuria: Secondary | ICD-10-CM

## 2019-09-11 LAB — POC URINALSYSI DIPSTICK (AUTOMATED)
Bilirubin, UA: NEGATIVE
Glucose, UA: NEGATIVE
Ketones, UA: NEGATIVE
Nitrite, UA: NEGATIVE
Protein, UA: POSITIVE — AB
Spec Grav, UA: 1.015 (ref 1.010–1.025)
Urobilinogen, UA: 0.2 E.U./dL
pH, UA: 6 (ref 5.0–8.0)

## 2019-09-11 MED ORDER — CEPHALEXIN 500 MG PO CAPS: 500.0000 mg | ORAL_CAPSULE | Freq: Two times a day (BID) | ORAL | 0 refills | Status: DC

## 2019-09-11 MED ORDER — FLUCONAZOLE 150 MG PO TABS: ORAL_TABLET | ORAL | 0 refills | Status: DC

## 2019-09-11 NOTE — Assessment & Plan Note (Signed)
Urine dip consistent with UTI Will send urine for culture Take the antibiotic as prescribed.   Take tylenol if needed.   Increase your water intake.  Call if no improvement

## 2019-09-11 NOTE — Patient Instructions (Addendum)
Take the antibiotic and diflucan as prescribed.  Take tylenol if needed.     Increase your water intake.   Call if no improvement     Urinary Tract Infection, Adult A urinary tract infection (UTI) is an infection of any part of the urinary tract, which includes the kidneys, ureters, bladder, and urethra. These organs make, store, and get rid of urine in the body. UTI can be a bladder infection (cystitis) or kidney infection (pyelonephritis). What are the causes? This infection may be caused by fungi, viruses, or bacteria. Bacteria are the most common cause of UTIs. This condition can also be caused by repeated incomplete emptying of the bladder during urination. What increases the risk? This condition is more likely to develop if:  You ignore your need to urinate or hold urine for long periods of time.  You do not empty your bladder completely during urination.  You wipe back to front after urinating or having a bowel movement, if you are female.  You are uncircumcised, if you are female.  You are constipated.  You have a urinary catheter that stays in place (indwelling).  You have a weak defense (immune) system.  You have a medical condition that affects your bowels, kidneys, or bladder.  You have diabetes.  You take antibiotic medicines frequently or for long periods of time, and the antibiotics no longer work well against certain types of infections (antibiotic resistance).  You take medicines that irritate your urinary tract.  You are exposed to chemicals that irritate your urinary tract.  You are female.  What are the signs or symptoms? Symptoms of this condition include:  Fever.  Frequent urination or passing small amounts of urine frequently.  Needing to urinate urgently.  Pain or burning with urination.  Urine that smells bad or unusual.  Cloudy urine.  Pain in the lower abdomen or back.  Trouble urinating.  Blood in the urine.  Vomiting or being  less hungry than normal.  Diarrhea or abdominal pain.  Vaginal discharge, if you are female.  How is this diagnosed? This condition is diagnosed with a medical history and physical exam. You will also need to provide a urine sample to test your urine. Other tests may be done, including:  Blood tests.  Sexually transmitted disease (STD) testing.  If you have had more than one UTI, a cystoscopy or imaging studies may be done to determine the cause of the infections. How is this treated? Treatment for this condition often includes a combination of two or more of the following:  Antibiotic medicine.  Other medicines to treat less common causes of UTI.  Over-the-counter medicines to treat pain.  Drinking enough water to stay hydrated.  Follow these instructions at home:  Take over-the-counter and prescription medicines only as told by your health care provider.  If you were prescribed an antibiotic, take it as told by your health care provider. Do not stop taking the antibiotic even if you start to feel better.  Avoid alcohol, caffeine, tea, and carbonated beverages. They can irritate your bladder.  Drink enough fluid to keep your urine clear or pale yellow.  Keep all follow-up visits as told by your health care provider. This is important.  Make sure to: ? Empty your bladder often and completely. Do not hold urine for long periods of time. ? Empty your bladder before and after sex. ? Wipe from front to back after a bowel movement if you are female. Use each tissue one time  when you wipe. Contact a health care provider if:  You have back pain.  You have a fever.  You feel nauseous or vomit.  Your symptoms do not get better after 3 days.  Your symptoms go away and then return. Get help right away if:  You have severe back pain or lower abdominal pain.  You are vomiting and cannot keep down any medicines or water. This information is not intended to replace advice  given to you by your health care provider. Make sure you discuss any questions you have with your health care provider. Document Released: 11/10/2004 Document Revised: 07/15/2015 Document Reviewed: 12/22/2014 Elsevier Interactive Patient Education  [employer].

## 2019-09-11 NOTE — Progress Notes (Signed)
Subjective:    Patient ID: [name], female    DOB: 1945-07-14, 74 y.o.   MRN: [id number]  HPI The patient is here for an acute visit.   She saw Luara on 7/19 and was diagnosed with a UTI and was prescribed macrobid and it caused a lot of nausea.  After two days her symptoms were much improved and her symptoms ended up going away.  When she finished it she started having symptoms c/w a yeast infection.  Monday morning she thought it was a yeast infection - she took dilfucan on Monday night.  Yesterday she felt good.  Today started having gross hematuria, dysuria, loosing bladder control, urinary frequency and urgency.    She still has irritation in the vagina and outside and still feels like she has a yeast infection.    Medications and allergies reviewed with patient and updated if appropriate.  Patient Active Problem List   Diagnosis Date Noted  . Folic acid deficiency 61/95/0932  . Bilateral leg edema 03/19/2019  . Hemorrhoids 03/19/2019  . B12 deficiency 03/19/2019  . Abnormal CT of the abdomen 02/27/2019  . Abnormal endoscopy of upper gastrointestinal tract 02/27/2019  . Neuroendocrine tumor 02/27/2019  . Iron deficiency anemia 02/19/2019  . Protein-calorie malnutrition, severe 01/31/2019  . Hypomagnesemia   . AKI (acute kidney injury) ([place]) 01/30/2019  . Inflammatory arthritis 01/02/2019  . Positive ANA (antinuclear antibody) 01/02/2019  . Exocrine pancreatic insufficiency 10/23/2018  . Multiple pulmonary nodules determined by computed tomography of lung 09/18/2018  . Decreased GFR 08/21/2018  . Nausea vomiting and diarrhea 08/20/2018  . Syncope 08/01/2018  . Hypokalemia 08/01/2018  . Abnormal CT scan, pelvis 07/31/2018  . Pneumothorax 03/14/2018  . Lung abnormality 01/11/2018  . Pulmonary embolism ([place]) 12/24/2017  . Lung mass 12/24/2017  . Small vessel disease, cerebrovascular 07/31/2017  . Atypical chest pain 06/14/2017  . Dizziness 06/14/2017  .  Hyperuricemia 05/03/2017  . Arthralgia 05/01/2017  . Hair loss 05/01/2017  . Vitamin D deficiency 10/31/2016  . Diarrhea 06/06/2016  . Diastolic dysfunction 67/01/4579  . Vertigo 12/24/2015  . Nonspecific abnormal electrocardiogram (ECG) (EKG) 12/24/2015  . Bilateral carotid artery disease, Mild 05/31/2015  . Thyroid nodule 05/31/2015  . Psoriasis   . Scoliosis   . Diabetes type 2, controlled ([place]) 06/03/2008  . Dyslipidemia 06/03/2008  . CARPAL TUNNEL SYNDROME 06/03/2008  . HTN (hypertension) 06/03/2008  . ALLERGIC RHINITIS 06/03/2008  . Asthma 06/03/2008  . GERD (gastroesophageal reflux disease) 06/03/2008  . Osteopenia 06/03/2008  . COLONIC POLYPS, HX OF 06/03/2008    Current Outpatient Medications on File Prior to Visit  Medication Sig Dispense Refill  . Ascorbic Acid (VITAMIN C GUMMIE PO) Take 1 tablet by mouth every evening.    . Cholecalciferol (VITAMIN D3 GUMMIES ADULT PO) Take 1 tablet by mouth every evening.    . clindamycin (CLEOCIN T) 1 % external solution Apply 1 application topically daily as needed (wounds).     . clobetasol (TEMOVATE) 0.05 % external solution Apply 1 application topically every morning.     . Coenzyme Q10 (COQ10 GUMMIES ADULT PO) Take 1 tablet by mouth every evening.    Marland Kitchen CREON 12000 units CPEP capsule Take 12,000 Units by mouth 5 (five) times daily.     Marland Kitchen ELIQUIS 5 MG TABS tablet TAKE 1 TABLET BY MOUTH TWICE A DAY 60 tablet 5  . Evolocumab (REPATHA SURECLICK) 998 MG/ML SOAJ Inject 1 pen into the skin every 14 (fourteen) days. 2 pen  11  . fluconazole (DIFLUCAN) 150 MG tablet Take 1 tablet today; repeat after 72 hours 2 tablet 0  . folic acid (FOLVITE) 1 MG tablet Take 1 tablet (1 mg total) by mouth daily. 30 tablet 5  . halobetasol (ULTRAVATE) 0.05 % cream Apply 1 application topically daily. After bathing    . lisinopril (ZESTRIL) 10 MG tablet Take 1 tablet (10 mg total) by mouth daily. 30 tablet 5  . loratadine (CLARITIN) 10 MG tablet Take 10 mg  by mouth daily as needed for allergies.    . magnesium oxide (MAG-OX) 400 MG tablet TAKE 1 TABLET BY MOUTH EVERY DAY 90 tablet 1  . Multiple Vitamins-Minerals (ADULT GUMMY PO) Take 1 tablet by mouth every evening.    . nebivolol (BYSTOLIC) 2.5 MG tablet TAKE ONE (1) TABLET BY MOUTH EVERY DAY 30 tablet 5  . nitrofurantoin, macrocrystal-monohydrate, (MACROBID) 100 MG capsule Take 1 capsule (100 mg total) by mouth 2 (two) times daily. 14 capsule 0  . omeprazole (PRILOSEC) 20 MG capsule Take 1 capsule (20 mg total) by mouth daily. 60 capsule 0  . ONETOUCH ULTRA test strip USE TEST STRIPS TO CHECK BLOOD SUGAR AT LEAST 3 TIMES DAILY 300 strip 1  . oxymetazoline (AFRIN) 0.05 % nasal spray Place 1 spray into both nostrils 2 (two) times daily.    Vladimir Faster Glycol-Propyl Glycol (LUBRICANT EYE DROPS) 0.4-0.3 % SOLN Place 1 drop into both eyes daily as needed (dry/irritated eyes.).    Marland Kitchen vitamin B-12 (CYANOCOBALAMIN) 1000 MCG tablet Take 1,000 mcg by mouth daily.     No current facility-administered medications on file prior to visit.    Past Medical History:  Diagnosis Date  . ALLERGIC RHINITIS   . ANEMIA-NOS   . Arthritis   . ASTHMA   . Carpal tunnel syndrome   . COLONIC POLYPS, HX OF   . Coronary artery disease    "mild CAD" noted on 12/05/17 in coronary CT scan  . DIABETES MELLITUS, TYPE II    diet controlled  . GERD   . Hand tingling   . HYPERLIPIDEMIA   . HYPERTENSION   . Neuroendocrine tumor   . OSTEOPENIA   . PONV (postoperative nausea and vomiting)    " it relaxes my bladder muscles and I have been known to pee all over the place"  . Psoriasis    severe, began soriatane 01/2012  . Pulmonary embolism ([place])   . Rectal fissure   . Scoliosis   . Wears glasses     Past Surgical History:  Procedure Laterality Date  . benign rectal growth  2004   removed by Dr. Zella Richer  . BIOPSY  08/09/2018   Procedure: BIOPSY;  Surgeon: Carol Ada, MD;  Location: [place];  Service:  Endoscopy;;  . BIOPSY  08/22/2018   Procedure: BIOPSY;  Surgeon: Juanita Craver, MD;  Location: [place];  Service: Endoscopy;;  . BIOPSY  03/07/2019   Procedure: BIOPSY;  Surgeon: Irving Copas., MD;  Location: [place];  Service: Gastroenterology;;  . BREAST BIOPSY Right   . BREAST EXCISIONAL BIOPSY Left   . BREAST SURGERY  1988   biopsy  . CESAREAN SECTION    . COLONOSCOPY N/A 08/22/2018   Procedure: COLONOSCOPY;  Surgeon: Juanita Craver, MD;  Location: [facility] ENDOSCOPY;  Service: Endoscopy;  Laterality: N/A;  . ENDOSCOPIC MUCOSAL RESECTION N/A 03/07/2019   Procedure: ENDOSCOPIC MUCOSAL RESECTION;  Surgeon: Rush Landmark Telford Nab., MD;  Location: [place];  Service: Gastroenterology;  Laterality: N/A;  .  ESOPHAGOGASTRODUODENOSCOPY (EGD) WITH PROPOFOL N/A 08/04/2018   Procedure: ESOPHAGOGASTRODUODENOSCOPY (EGD) WITH PROPOFOL;  Surgeon: Ladene Artist, MD;  Location: [place];  Service: Gastroenterology;  Laterality: N/A;  . ESOPHAGOGASTRODUODENOSCOPY (EGD) WITH PROPOFOL N/A 03/07/2019   Procedure: ESOPHAGOGASTRODUODENOSCOPY (EGD) WITH PROPOFOL;  Surgeon: Rush Landmark Telford Nab., MD;  Location: [place];  Service: Gastroenterology;  Laterality: N/A;  . EUS N/A 03/07/2019   Procedure: UPPER ENDOSCOPIC ULTRASOUND (EUS) RADIAL;  Surgeon: Rush Landmark Telford Nab., MD;  Location: [place];  Service: Gastroenterology;  Laterality: N/A;  . FINE NEEDLE ASPIRATION  03/07/2019   Procedure: FINE NEEDLE ASPIRATION (FNA) LINEAR;  Surgeon: Irving Copas., MD;  Location: [place];  Service: Gastroenterology;;  . Otho Darner SIGMOIDOSCOPY N/A 08/04/2018   Procedure: FLEXIBLE SIGMOIDOSCOPY;  Surgeon: Ladene Artist, MD;  Location: [place];  Service: Gastroenterology;  Laterality: N/A;  . FLEXIBLE SIGMOIDOSCOPY N/A 08/09/2018   Procedure: FLEXIBLE SIGMOIDOSCOPY;  Surgeon: Carol Ada, MD;  Location: [place];  Service: Endoscopy;  Laterality: N/A;  . HEMOSTASIS CLIP  PLACEMENT  03/07/2019   Procedure: HEMOSTASIS CLIP PLACEMENT;  Surgeon: Irving Copas., MD;  Location: [place];  Service: Gastroenterology;;  . Lia Foyer LIFTING INJECTION  03/07/2019   Procedure: SUBMUCOSAL LIFTING INJECTION;  Surgeon: Irving Copas., MD;  Location: [place];  Service: Gastroenterology;;  . VIDEO BRONCHOSCOPY WITH ENDOBRONCHIAL NAVIGATION N/A 03/14/2018   Procedure: VIDEO BRONCHOSCOPY WITH ENDOBRONCHIAL NAVIGATION;  Surgeon: Collene Gobble, MD;  [facility];  Service: Thoracic;  Laterality: N/A;    Social History   Socioeconomic History  . Marital status: Single    Spouse name: Not on file  . Number of children: 1  . Years of education: 74  . Highest education level: Bachelor's degree (e.g., BA, AB, BS)  Occupational History  . Occupation: retired Careers adviser  Tobacco Use  . Smoking status: Former Smoker    Packs/day: 1.00    Years: 10.00    Pack years: 10.00    Quit date: 02/14/1974    Years since quitting: 45.6  . Smokeless tobacco: Never Used  Vaping Use  . Vaping Use: Never used  Substance and Sexual Activity  . Alcohol use: No  . Drug use: No  . Sexual activity: Not on file  Other Topics Concern  . Not on file  Social History Narrative   Lives alone in a one story home.  Has one child and one grandchild.  Retired Careers adviser.  Education: college.    Social Determinants of Health   Financial Resource Strain:   . Difficulty of Paying Living Expenses:   Food Insecurity:   . Worried About Charity fundraiser in the Last Year:   . Arboriculturist in the Last Year:   Transportation Needs:   . Film/video editor (Medical):   Marland Kitchen Lack of Transportation (Non-Medical):   Physical Activity:   . Days of Exercise per Week:   . Minutes of Exercise per Session:   Stress:   . Feeling of Stress :   Social Connections:   . Frequency of Communication with Friends  and Family:   . Frequency of Social Gatherings with Friends and Family:   . Attends Religious Services:   . Active Member of Clubs or Organizations:   . Attends Archivist Meetings:   Marland Kitchen Marital Status:     Family History  Problem Relation Age of Onset  . Lung cancer Father   . Arthritis  Other        Parents  . Asthma Other        parent, other relative  . Breast cancer Other        other relative  . Hypertension Other        parent, other relative  . Heart disease Other        parent, other relative  . Heart disease Mother   . Asthma Mother   . Breast cancer Maternal Aunt 68  . Parkinson's disease Maternal Grandmother   . Rheumatic fever Maternal Grandfather     Review of Systems  Constitutional: Positive for chills (occ). Negative for fever.  Gastrointestinal: Positive for nausea.  Musculoskeletal: Positive for back pain (worse than usual).       Objective:   Vitals:   09/11/19 1421  BP: (!) 150/78  Pulse: 70  Temp: 98.4 F (36.9 C)  SpO2: 97%   BP Readings from Last 3 Encounters:  09/11/19 (!) 150/78  09/02/19 (!) 162/84  08/13/19 (!) 150/88   Wt Readings from Last 3 Encounters:  09/11/19 141 lb (64 kg)  09/02/19 141 lb (64 kg)  08/13/19 138 lb 3.2 oz (62.7 kg)   Body mass index is 26.64 kg/m.   Physical Exam Constitutional:      General: She is not in acute distress.    Appearance: Normal appearance. She is not ill-appearing.  Abdominal:     Palpations: Abdomen is soft.     Tenderness: There is abdominal tenderness (minimal tenderness in suprapubic region). There is no right CVA tenderness, left CVA tenderness, guarding or rebound.  Skin:    General: Skin is warm and dry.  Neurological:     Mental Status: She is alert.            Assessment & Plan:    See Problem List for Assessment and Plan of chronic medical problems.    This visit occurred during the SARS-CoV-2 public health emergency.  Safety protocols were in place,  including screening questions prior to the visit, additional usage of staff PPE, and extensive cleaning of exam room while observing appropriate contact time as indicated for disinfecting solutions.

## 2019-09-11 NOTE — Assessment & Plan Note (Signed)
Acute Symptoms c/w yeast infection improved with 1 diflucan and has second to take tomorrow Will give additional rx for diflucan since she will be taking additional abx Can also use otc monistat cream

## 2019-09-12 LAB — URINE CULTURE

## 2019-09-19 ENCOUNTER — Telehealth: Payer: Self-pay | Admitting: Internal Medicine

## 2019-09-19 NOTE — Progress Notes (Signed)
Subjective:    Patient ID: [name], female    DOB: 1945/10/31, 74 y.o.   MRN: [id number]  HPI The patient is here for an acute visit.  ? UTI:  Her symptoms started before her last visit.  She states dysuria but like it used to be.  She states vulvar burning and irritation, urinary frequency, urinary urgency.   She has nausea but this is related to her GI issues.    She denies hematuria, abdominal pain, new back pain, fever.  She tried monistat and it burned.  She tried mytrex and it felt better (nystatin/triamcinolone) - she only used this once  She is eating too many sugars.    Medications and allergies reviewed with patient and updated if appropriate.  Patient Active Problem List   Diagnosis Date Noted  . Dysuria 09/20/2019  . UTI (urinary tract infection) 09/11/2019  . Vaginal yeast infection 09/11/2019  . Folic acid deficiency 41/74/0814  . Bilateral leg edema 03/19/2019  . Hemorrhoids 03/19/2019  . B12 deficiency 03/19/2019  . Abnormal CT of the abdomen 02/27/2019  . Abnormal endoscopy of upper gastrointestinal tract 02/27/2019  . Neuroendocrine tumor 02/27/2019  . Iron deficiency anemia 02/19/2019  . Protein-calorie malnutrition, severe 01/31/2019  . Hypomagnesemia   . AKI (acute kidney injury) ([place]) 01/30/2019  . Inflammatory arthritis 01/02/2019  . Positive ANA (antinuclear antibody) 01/02/2019  . Exocrine pancreatic insufficiency 10/23/2018  . Multiple pulmonary nodules determined by computed tomography of lung 09/18/2018  . Decreased GFR 08/21/2018  . Nausea vomiting and diarrhea 08/20/2018  . Syncope 08/01/2018  . Hypokalemia 08/01/2018  . Abnormal CT scan, pelvis 07/31/2018  . Pneumothorax 03/14/2018  . Lung abnormality 01/11/2018  . Pulmonary embolism ([place]) 12/24/2017  . Lung mass 12/24/2017  . Small vessel disease, cerebrovascular 07/31/2017  . Atypical chest pain 06/14/2017  . Dizziness 06/14/2017  . Hyperuricemia 05/03/2017  .  Arthralgia 05/01/2017  . Hair loss 05/01/2017  . Vitamin D deficiency 10/31/2016  . Diarrhea 06/06/2016  . Diastolic dysfunction 48/18/5631  . Vertigo 12/24/2015  . Nonspecific abnormal electrocardiogram (ECG) (EKG) 12/24/2015  . Bilateral carotid artery disease, Mild 05/31/2015  . Thyroid nodule 05/31/2015  . Psoriasis   . Scoliosis   . Diabetes type 2, controlled ([place]) 06/03/2008  . Dyslipidemia 06/03/2008  . CARPAL TUNNEL SYNDROME 06/03/2008  . HTN (hypertension) 06/03/2008  . ALLERGIC RHINITIS 06/03/2008  . Asthma 06/03/2008  . GERD (gastroesophageal reflux disease) 06/03/2008  . Osteopenia 06/03/2008  . COLONIC POLYPS, HX OF 06/03/2008    Current Outpatient Medications on File Prior to Visit  Medication Sig Dispense Refill  . Ascorbic Acid (VITAMIN C GUMMIE PO) Take 1 tablet by mouth every evening.    . Cholecalciferol (VITAMIN D3 GUMMIES ADULT PO) Take 1 tablet by mouth every evening.    . clindamycin (CLEOCIN T) 1 % external solution Apply 1 application topically daily as needed (wounds).     . clobetasol (TEMOVATE) 0.05 % external solution Apply 1 application topically every morning.     . Coenzyme Q10 (COQ10 GUMMIES ADULT PO) Take 1 tablet by mouth every evening.    Marland Kitchen CREON 12000 units CPEP capsule Take 12,000 Units by mouth 5 (five) times daily.     Marland Kitchen ELIQUIS 5 MG TABS tablet TAKE 1 TABLET BY MOUTH TWICE A DAY 60 tablet 5  . Evolocumab (REPATHA SURECLICK) 497 MG/ML SOAJ Inject 1 pen into the skin every 14 (fourteen) days. 2 pen 11  . fluconazole (DIFLUCAN) 150 MG  tablet Take 1 tablet today Q 72 hrs x 3 days 3 tablet 0  . folic acid (FOLVITE) 1 MG tablet Take 1 tablet (1 mg total) by mouth daily. 30 tablet 5  . halobetasol (ULTRAVATE) 0.05 % cream Apply 1 application topically daily. After bathing    . lisinopril (ZESTRIL) 10 MG tablet Take 1 tablet (10 mg total) by mouth daily. 30 tablet 5  . loratadine (CLARITIN) 10 MG tablet Take 10 mg by mouth daily as needed for  allergies.    . magnesium oxide (MAG-OX) 400 MG tablet TAKE 1 TABLET BY MOUTH EVERY DAY 90 tablet 1  . Multiple Vitamins-Minerals (ADULT GUMMY PO) Take 1 tablet by mouth every evening.    . nebivolol (BYSTOLIC) 2.5 MG tablet TAKE ONE (1) TABLET BY MOUTH EVERY DAY 30 tablet 5  . omeprazole (PRILOSEC OTC) 20 MG tablet Take 20 mg by mouth daily.    [name] test strip USE TEST STRIPS TO CHECK BLOOD SUGAR AT LEAST 3 TIMES DAILY 300 strip 1  . oxymetazoline (AFRIN) 0.05 % nasal spray Place 1 spray into both nostrils 2 (two) times daily.    Vladimir Faster Glycol-Propyl Glycol (LUBRICANT EYE DROPS) 0.4-0.3 % SOLN Place 1 drop into both eyes daily as needed (dry/irritated eyes.).    Marland Kitchen vitamin B-12 (CYANOCOBALAMIN) 1000 MCG tablet Take 1,000 mcg by mouth daily.     No current facility-administered medications on file prior to visit.    Past Medical History:  Diagnosis Date  . ALLERGIC RHINITIS   . ANEMIA-NOS   . Arthritis   . ASTHMA   . Carpal tunnel syndrome   . COLONIC POLYPS, HX OF   . Coronary artery disease    "mild CAD" noted on 12/05/17 in coronary CT scan  . DIABETES MELLITUS, TYPE II    diet controlled  . GERD   . Hand tingling   . HYPERLIPIDEMIA   . HYPERTENSION   . Neuroendocrine tumor   . OSTEOPENIA   . PONV (postoperative nausea and vomiting)    " it relaxes my bladder muscles and I have been known to pee all over the place"  . Psoriasis    severe, began soriatane 01/2012  . Pulmonary embolism ([place])   . Rectal fissure   . Scoliosis   . Wears glasses     Past Surgical History:  Procedure Laterality Date  . benign rectal growth  2004   removed by Dr. Zella Richer  . BIOPSY  08/09/2018   Procedure: BIOPSY;  Surgeon: Carol Ada, MD;  Location: [place];  Service: Endoscopy;;  . BIOPSY  08/22/2018   Procedure: BIOPSY;  Surgeon: Juanita Craver, MD;  Location: [place];  Service: Endoscopy;;  . BIOPSY  03/07/2019   Procedure: BIOPSY;  Surgeon: Irving Copas., MD;  Location: [place];  Service: Gastroenterology;;  . BREAST BIOPSY Right   . BREAST EXCISIONAL BIOPSY Left   . BREAST SURGERY  1988   biopsy  . CESAREAN SECTION    . COLONOSCOPY N/A 08/22/2018   Procedure: COLONOSCOPY;  Surgeon: Juanita Craver, MD;  Location: [facility] ENDOSCOPY;  Service: Endoscopy;  Laterality: N/A;  . ENDOSCOPIC MUCOSAL RESECTION N/A 03/07/2019   Procedure: ENDOSCOPIC MUCOSAL RESECTION;  Surgeon: Rush Landmark Telford Nab., MD;  Location: [place];  Service: Gastroenterology;  Laterality: N/A;  . ESOPHAGOGASTRODUODENOSCOPY (EGD) WITH PROPOFOL N/A 08/04/2018   Procedure: ESOPHAGOGASTRODUODENOSCOPY (EGD) WITH PROPOFOL;  Surgeon: Ladene Artist, MD;  Location: [place];  Service: Gastroenterology;  Laterality: N/A;  .  ESOPHAGOGASTRODUODENOSCOPY (EGD) WITH PROPOFOL N/A 03/07/2019   Procedure: ESOPHAGOGASTRODUODENOSCOPY (EGD) WITH PROPOFOL;  Surgeon: Rush Landmark Telford Nab., MD;  Location: [place];  Service: Gastroenterology;  Laterality: N/A;  . EUS N/A 03/07/2019   Procedure: UPPER ENDOSCOPIC ULTRASOUND (EUS) RADIAL;  Surgeon: Rush Landmark Telford Nab., MD;  Location: [place];  Service: Gastroenterology;  Laterality: N/A;  . FINE NEEDLE ASPIRATION  03/07/2019   Procedure: FINE NEEDLE ASPIRATION (FNA) LINEAR;  Surgeon: Irving Copas., MD;  Location: [place];  Service: Gastroenterology;;  . Otho Darner SIGMOIDOSCOPY N/A 08/04/2018   Procedure: FLEXIBLE SIGMOIDOSCOPY;  Surgeon: Ladene Artist, MD;  Location: [place];  Service: Gastroenterology;  Laterality: N/A;  . FLEXIBLE SIGMOIDOSCOPY N/A 08/09/2018   Procedure: FLEXIBLE SIGMOIDOSCOPY;  Surgeon: Carol Ada, MD;  Location: [place];  Service: Endoscopy;  Laterality: N/A;  . HEMOSTASIS CLIP PLACEMENT  03/07/2019   Procedure: HEMOSTASIS CLIP PLACEMENT;  Surgeon: Irving Copas., MD;  Location: [place];  Service: Gastroenterology;;  . Lia Foyer LIFTING INJECTION  03/07/2019    Procedure: SUBMUCOSAL LIFTING INJECTION;  Surgeon: Irving Copas., MD;  Location: [place];  Service: Gastroenterology;;  . VIDEO BRONCHOSCOPY WITH ENDOBRONCHIAL NAVIGATION N/A 03/14/2018   Procedure: VIDEO BRONCHOSCOPY WITH ENDOBRONCHIAL NAVIGATION;  Surgeon: Collene Gobble, MD;  [facility];  Service: Thoracic;  Laterality: N/A;    Social History   Socioeconomic History  . Marital status: Single    Spouse name: Not on file  . Number of children: 1  . Years of education: 24  . Highest education level: Bachelor's degree (e.g., BA, AB, BS)  Occupational History  . Occupation: retired Careers adviser  Tobacco Use  . Smoking status: Former Smoker    Packs/day: 1.00    Years: 10.00    Pack years: 10.00    Quit date: 02/14/1974    Years since quitting: 45.6  . Smokeless tobacco: Never Used  Vaping Use  . Vaping Use: Never used  Substance and Sexual Activity  . Alcohol use: No  . Drug use: No  . Sexual activity: Not on file  Other Topics Concern  . Not on file  Social History Narrative   Lives alone in a one story home.  Has one child and one grandchild.  Retired Careers adviser.  Education: college.    Social Determinants of Health   Financial Resource Strain:   . Difficulty of Paying Living Expenses:   Food Insecurity:   . Worried About Charity fundraiser in the Last Year:   . Arboriculturist in the Last Year:   Transportation Needs:   . Film/video editor (Medical):   Marland Kitchen Lack of Transportation (Non-Medical):   Physical Activity:   . Days of Exercise per Week:   . Minutes of Exercise per Session:   Stress:   . Feeling of Stress :   Social Connections:   . Frequency of Communication with Friends and Family:   . Frequency of Social Gatherings with Friends and Family:   . Attends Religious Services:   . Active Member of Clubs or Organizations:   . Attends Archivist Meetings:   Marland Kitchen  Marital Status:     Family History  Problem Relation Age of Onset  . Lung cancer Father   . Arthritis Other        Parents  . Asthma Other        parent, other relative  . Breast cancer Other  other relative  . Hypertension Other        parent, other relative  . Heart disease Other        parent, other relative  . Heart disease Mother   . Asthma Mother   . Breast cancer Maternal Aunt 68  . Parkinson's disease Maternal Grandmother   . Rheumatic fever Maternal Grandfather     Review of Systems     Objective:   Vitals:   09/20/19 0837  BP: (!) 148/90  Pulse: 69  Temp: 99 F (37.2 C)  SpO2: 98%   BP Readings from Last 3 Encounters:  09/20/19 (!) 148/90  09/11/19 (!) 150/78  09/02/19 (!) 162/84   Wt Readings from Last 3 Encounters:  09/20/19 142 lb (64.4 kg)  09/11/19 141 lb (64 kg)  09/02/19 141 lb (64 kg)   Body mass index is 26.83 kg/m.   Physical Exam Constitutional:      General: She is not in acute distress.    Appearance: Normal appearance. She is not ill-appearing.  Abdominal:     General: There is no distension.     Palpations: Abdomen is soft.     Tenderness: There is no abdominal tenderness. There is no right CVA tenderness, left CVA tenderness or guarding.  Skin:    General: Skin is warm and dry.  Neurological:     Mental Status: She is alert.            Assessment & Plan:    See Problem List for Assessment and Plan of chronic medical problems.    This visit occurred during the SARS-CoV-2 public health emergency.  Safety protocols were in place, including screening questions prior to the visit, additional usage of staff PPE, and extensive cleaning of exam room while observing appropriate contact time as indicated for disinfecting solutions.

## 2019-09-19 NOTE — Telephone Encounter (Signed)
New Message:   Pt states she is still having issues with her recent UTI/Yeast infection and needs to come back in and see you. Can you provide me with a time for today or tomorrow that she can come in? Per the schedule you are at the max. Please advise.

## 2019-09-19 NOTE — Telephone Encounter (Signed)
New message:   Please disregard previous message. You had a cancellation for tomorrow so I called her and put her in. Thanks.

## 2019-09-20 ENCOUNTER — Ambulatory Visit (INDEPENDENT_AMBULATORY_CARE_PROVIDER_SITE_OTHER): Payer: Medicare Other | Admitting: Internal Medicine

## 2019-09-20 ENCOUNTER — Other Ambulatory Visit: Payer: Self-pay

## 2019-09-20 ENCOUNTER — Encounter: Payer: Self-pay | Admitting: Internal Medicine

## 2019-09-20 VITALS — BP 148/90 | HR 69 | Temp 99.0°F | Ht 61.0 in | Wt 142.0 lb

## 2019-09-20 DIAGNOSIS — B373 Candidiasis of vulva and vagina: Secondary | ICD-10-CM

## 2019-09-20 DIAGNOSIS — R5383 Other fatigue: Secondary | ICD-10-CM | POA: Diagnosis not present

## 2019-09-20 DIAGNOSIS — R300 Dysuria: Secondary | ICD-10-CM | POA: Insufficient documentation

## 2019-09-20 DIAGNOSIS — R3129 Other microscopic hematuria: Secondary | ICD-10-CM

## 2019-09-20 DIAGNOSIS — E538 Deficiency of other specified B group vitamins: Secondary | ICD-10-CM | POA: Diagnosis not present

## 2019-09-20 DIAGNOSIS — B3731 Acute candidiasis of vulva and vagina: Secondary | ICD-10-CM

## 2019-09-20 DIAGNOSIS — E119 Type 2 diabetes mellitus without complications: Secondary | ICD-10-CM | POA: Diagnosis not present

## 2019-09-20 DIAGNOSIS — N3001 Acute cystitis with hematuria: Secondary | ICD-10-CM

## 2019-09-20 LAB — POC URINALSYSI DIPSTICK (AUTOMATED)
Bilirubin, UA: NEGATIVE
Blood, UA: POSITIVE
Glucose, UA: NEGATIVE
Ketones, UA: NEGATIVE
Leukocytes, UA: NEGATIVE
Nitrite, UA: NEGATIVE
Protein, UA: NEGATIVE
Spec Grav, UA: 1.015 (ref 1.010–1.025)
Urobilinogen, UA: 0.2 E.U./dL
pH, UA: 6 (ref 5.0–8.0)

## 2019-09-20 MED ORDER — NYSTATIN-TRIAMCINOLONE 100000-0.1 UNIT/GM-% EX OINT
1.0000 | TOPICAL_OINTMENT | Freq: Two times a day (BID) | CUTANEOUS | 0 refills | Status: DC
Start: 2019-09-20 — End: 2019-09-23

## 2019-09-20 NOTE — Assessment & Plan Note (Signed)
Chronic Not compliant with low sugar diet Check a1c Low sugar / carb diet Stressed regular exercise

## 2019-09-20 NOTE — Assessment & Plan Note (Signed)
Subacute Symptoms are better after abx started Urine dip here looks neg but will send it for cx No change in abx

## 2019-09-20 NOTE — Assessment & Plan Note (Signed)
Subacute Still with symptoms c/w yeast infection - no vaginal discharge mycolog twice daily

## 2019-09-20 NOTE — Assessment & Plan Note (Signed)
Chronic Taking oral B12 - ? Absorbing Check level

## 2019-09-20 NOTE — Assessment & Plan Note (Signed)
Acute on chronic Will check labs to r/o some causes B12 given b12 def, cbc, cmp, iron, tsh uti may be causing some fatigue Start regular exercise

## 2019-09-20 NOTE — Addendum Note (Signed)
Addended by: Cresenciano Lick on: 09/20/2019 03:17 PM   Modules accepted: Orders

## 2019-09-20 NOTE — Assessment & Plan Note (Signed)
Check Urine dip - small amt of blood - no evidence of UTI Completed abx Will check cx to confirm no residual infection

## 2019-09-20 NOTE — Patient Instructions (Addendum)
Have blood work today.    We will send your urine for culture.    Use the cream twice daily until your symptoms resolve.   Take the last diflucan.

## 2019-09-21 LAB — IRON,TIBC AND FERRITIN PANEL
%SAT: 16 % (calc) (ref 16–45)
Ferritin: 84 ng/mL (ref 16–288)
Iron: 60 ug/dL (ref 45–160)
TIBC: 376 mcg/dL (calc) (ref 250–450)

## 2019-09-21 LAB — CBC WITH DIFFERENTIAL/PLATELET
Absolute Monocytes: 802 cells/uL (ref 200–950)
Basophils Absolute: 68 cells/uL (ref 0–200)
Basophils Relative: 0.6 %
Eosinophils Absolute: 181 cells/uL (ref 15–500)
Eosinophils Relative: 1.6 %
HCT: 35.6 % (ref 35.0–45.0)
Hemoglobin: 12 g/dL (ref 11.7–15.5)
Lymphs Abs: 1695 cells/uL (ref 850–3900)
MCH: 31.3 pg (ref 27.0–33.0)
MCHC: 33.7 g/dL (ref 32.0–36.0)
MCV: 92.7 fL (ref 80.0–100.0)
MPV: 10.2 fL (ref 7.5–12.5)
Monocytes Relative: 7.1 %
Neutro Abs: 8554 cells/uL — ABNORMAL HIGH (ref 1500–7800)
Neutrophils Relative %: 75.7 %
Platelets: 304 10*3/uL (ref 140–400)
RBC: 3.84 10*6/uL (ref 3.80–5.10)
RDW: 14.1 % (ref 11.0–15.0)
Total Lymphocyte: 15 %
WBC: 11.3 10*3/uL — ABNORMAL HIGH (ref 3.8–10.8)

## 2019-09-21 LAB — COMPLETE METABOLIC PANEL WITH GFR
AG Ratio: 1.3 (calc) (ref 1.0–2.5)
ALT: 27 U/L (ref 6–29)
AST: 21 U/L (ref 10–35)
Albumin: 3.9 g/dL (ref 3.6–5.1)
Alkaline phosphatase (APISO): 113 U/L (ref 37–153)
BUN/Creatinine Ratio: 19 (calc) (ref 6–22)
BUN: 22 mg/dL (ref 7–25)
CO2: 24 mmol/L (ref 20–32)
Calcium: 9.7 mg/dL (ref 8.6–10.4)
Chloride: 100 mmol/L (ref 98–110)
Creat: 1.18 mg/dL — ABNORMAL HIGH (ref 0.60–0.93)
GFR, Est African American: 52 mL/min/{1.73_m2} — ABNORMAL LOW (ref >=60)
GFR, Est Non African American: 45 mL/min/{1.73_m2} — ABNORMAL LOW (ref >=60)
Globulin: 3 g/dL (calc) (ref 1.9–3.7)
Glucose, Bld: 98 mg/dL (ref 65–99)
Potassium: 4.8 mmol/L (ref 3.5–5.3)
Sodium: 137 mmol/L (ref 135–146)
Total Bilirubin: 0.6 mg/dL (ref 0.2–1.2)
Total Protein: 6.9 g/dL (ref 6.1–8.1)

## 2019-09-21 LAB — TSH: TSH: 2.51 mIU/L (ref 0.40–4.50)

## 2019-09-21 LAB — VITAMIN B12: Vitamin B-12: >2000 pg/mL — ABNORMAL HIGH (ref 200–1100)

## 2019-09-21 LAB — HEMOGLOBIN A1C
Hgb A1c MFr Bld: 6 % of total Hgb — ABNORMAL HIGH (ref <5.7)
Mean Plasma Glucose: 126 (calc)
eAG (mmol/L): 7 (calc)

## 2019-09-22 LAB — CULTURE, URINE COMPREHENSIVE: RESULT:: NO GROWTH

## 2019-09-23 ENCOUNTER — Other Ambulatory Visit: Payer: Self-pay | Admitting: Internal Medicine

## 2019-09-23 ENCOUNTER — Telehealth: Payer: Self-pay | Admitting: Internal Medicine

## 2019-09-23 MED ORDER — FLUCONAZOLE 150 MG PO TABS: ORAL_TABLET | ORAL | 0 refills | Status: DC

## 2019-09-23 MED ORDER — NYSTATIN-TRIAMCINOLONE 100000-0.1 UNIT/GM-% EX CREA: 1.0000 "application " | TOPICAL_CREAM | Freq: Two times a day (BID) | CUTANEOUS | 0 refills | Status: DC

## 2019-09-23 NOTE — Telephone Encounter (Signed)
1) I will call in another Diflucan for her; 2) We will call in the cream but it may be her insurance that is requiring the ointment. 3) There is no acute concern regarding the GFR. It may be a normal aging change for her but she can get it re-checked when she sees Dr. Quay Burow in September.

## 2019-09-23 NOTE — Telephone Encounter (Signed)
New Message:   Pt states she still has some yeast and would like to know if the fluconazole (DIFLUCAN) 150 MG tablet can be refilled. Pt would also like to know if a new Mytrex prescription can be called that is a cream not a ointment. Pt states she does not do well on ointment. Pt states she is very worried about gfr score or 45. Pt would like to know what can be done about it. Please advise.

## 2019-09-24 NOTE — Telephone Encounter (Signed)
Spoke with patient today and info given. 

## 2019-09-24 NOTE — Telephone Encounter (Signed)
The creatinine can vary - it was a little higher last month.  She should monitor her BP and make sure if it well controlled, continue to drink a lot of water and we will try to keep the medications to a minimum - that is the best way to improve/prevent worsening of the kidney function.    If the kidney function worsens we can always have her see a kidney specialist.   We will recheck next month.

## 2019-10-08 ENCOUNTER — Ambulatory Visit: Payer: Self-pay

## 2019-10-08 ENCOUNTER — Ambulatory Visit (INDEPENDENT_AMBULATORY_CARE_PROVIDER_SITE_OTHER): Payer: Medicare Other | Admitting: Orthopaedic Surgery

## 2019-10-08 ENCOUNTER — Encounter: Payer: Self-pay | Admitting: Orthopaedic Surgery

## 2019-10-08 DIAGNOSIS — M25552 Pain in left hip: Secondary | ICD-10-CM | POA: Diagnosis not present

## 2019-10-08 DIAGNOSIS — G8929 Other chronic pain: Secondary | ICD-10-CM

## 2019-10-08 DIAGNOSIS — M25561 Pain in right knee: Secondary | ICD-10-CM | POA: Diagnosis not present

## 2019-10-08 MED ORDER — METHYLPREDNISOLONE ACETATE 40 MG/ML IJ SUSP
40.0000 mg | INTRAMUSCULAR | Status: AC | PRN
  Administered 2019-10-08: 40 mg via INTRA_ARTICULAR

## 2019-10-08 MED ORDER — LIDOCAINE HCL 1 % IJ SOLN
3.0000 mL | INTRAMUSCULAR | Status: AC | PRN
  Administered 2019-10-08: 3 mL

## 2019-10-08 NOTE — Progress Notes (Signed)
Office Visit Note   Patient: [name]           Date of Birth: 1945-10-18           MRN: [id number] Visit Date: 10/08/2019              Requested by: [name], MD [place],  [place] [address] PCP: [name], MD   Assessment & Plan: Visit Diagnoses:  1. Acute pain of right knee   2. Chronic left hip pain     Plan: I did recommend today aspirating her right knee and placing a steroid in the knee.  She agreed with this treatment plan and I explained the risk and benefits of steroid injections.  I was able to aspirate about 15 cc of fluid off of her knee and there is a little bit of sediment in it suggesting that she has got some synovitis or something going on with the cartilage in the knee.  I did place a steroid injection in the knee and she did tolerate it well.  Due to the swelling in her leg, I recommended compressive garments.  She says she actually does wear these but did not wear it today so I could see her leg.  I would like to see her back in just 2 weeks to make sure she is doing well.  All questions and concerns were answered and addressed.  Follow-Up Instructions: Return in about 2 weeks (around 10/22/2019).   Orders:  Orders Placed This Encounter  Procedures  . Large Joint Inj  . XR Knee 1-2 Views Right  . XR HIP UNILAT W OR W/O PELVIS 2-3 VIEWS LEFT   No orders of the defined types were placed in this encounter.     Procedures: Large Joint Inj: R knee on 10/08/2019 10:58 AM Indications: diagnostic evaluation and pain Details: 22 G 1.5 in needle, superolateral approach  Arthrogram: No  Medications: 3 mL lidocaine 1 %; 40 mg methylPREDNISolone acetate 40 MG/ML Outcome: tolerated well, no immediate complications Procedure, treatment alternatives, risks and benefits explained, specific risks discussed. Consent was given by the patient. Immediately prior to procedure a time out was called to verify the correct patient, procedure,  equipment, support staff and site/side marked as required. Patient was prepped and draped in the usual sterile fashion.       Clinical Data: No additional findings.   Subjective: Chief Complaint  Patient presents with  . Right Knee - Pain  . Left Hip - Pain  The patient is a 74 year old female who comes in today with significant right knee pain as well as left hip pain.  She states that her legs just stopped working recently.  She is using a rolling walker.  She also has severe and well-documented scoliosis of her lumbar spine and does get chronic back pain.  She was at [facility] yesterday.  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  She has a history of high cholesterol and some of those medications causing joint aches and pains.  She is working on another different medicine as well.  She does not tolerate narcotics at all.  She is on Eliquis so she cannot take anti-inflammatories but she has taken some Tylenol and occasional Advil with that.  Her right knee has had a sharp stabbing pain and swelling.  She feels like she has muscle fatigue with her muscles as well and her knee has been giving way on  the right side with no known injury.  HPI  Review of Systems She currently denies any chest pain or shortness of breath.  She denies any fever, chills, nausea, vomiting  Objective: Vital Signs: There were no vitals taken for this visit.  Physical Exam She is alert and orient x3 and in no acute distress Ortho Exam When I examined both knees, there is a mild effusion of her right knee and she has pain with any attempts of motion of the right knee.  Her calves are soft bilaterally but I do feel that her right leg is a little larger than the left leg in terms of the calf area but there is no palpable cord in the back of her leg and a negative Homans' sign.  Both hips move smoothly and fluidly but they are painful more so on the left than the right. Specialty Comments:    No specialty comments available.  Imaging: XR HIP UNILAT W OR W/O PELVIS 2-3 VIEWS LEFT  Result Date: 10/08/2019 An AP pelvis and lateral of the left hip shows no acute findings.  Both hips are well located with good space in the joints.  XR Knee 1-2 Views Right  Result Date: 10/08/2019 Two views of the right knee shows no acute findings.  The joint space is well-maintained.  There is only slight medial joint space narrowing.    PMFS History: Patient Active Problem List   Diagnosis Date Noted  . Dysuria 09/20/2019  . UTI (urinary tract infection) 09/11/2019  . Vulvar candidiasis 09/11/2019  . Folic acid deficiency 23/53/6144  . Bilateral leg edema 03/19/2019  . Hemorrhoids 03/19/2019  . B12 deficiency 03/19/2019  . Abnormal CT of the abdomen 02/27/2019  . Abnormal endoscopy of upper gastrointestinal tract 02/27/2019  . Neuroendocrine tumor 02/27/2019  . Iron deficiency anemia 02/19/2019  . Protein-calorie malnutrition, severe 01/31/2019  . Hypomagnesemia   . AKI (acute kidney injury) ([place]) 01/30/2019  . Inflammatory arthritis 01/02/2019  . Positive ANA (antinuclear antibody) 01/02/2019  . Exocrine pancreatic insufficiency 10/23/2018  . Multiple pulmonary nodules determined by computed tomography of lung 09/18/2018  . Decreased GFR 08/21/2018  . Nausea vomiting and diarrhea 08/20/2018  . Syncope 08/01/2018  . Hypokalemia 08/01/2018  . Abnormal CT scan, pelvis 07/31/2018  . Pneumothorax 03/14/2018  . Lung abnormality 01/11/2018  . Pulmonary embolism ([place]) 12/24/2017  . Lung mass 12/24/2017  . Small vessel disease, cerebrovascular 07/31/2017  . Atypical chest pain 06/14/2017  . Dizziness 06/14/2017  . Hyperuricemia 05/03/2017  . Arthralgia 05/01/2017  . Hair loss 05/01/2017  . Vitamin D deficiency 10/31/2016  . Diarrhea 06/06/2016  . Diastolic dysfunction 31/54/0086  . Vertigo 12/24/2015  . Fatigue 12/24/2015  . Nonspecific abnormal electrocardiogram (ECG) (EKG)  12/24/2015  . Bilateral carotid artery disease, Mild 05/31/2015  . Thyroid nodule 05/31/2015  . Psoriasis   . Scoliosis   . Diabetes type 2, controlled ([place]) 06/03/2008  . Dyslipidemia 06/03/2008  . CARPAL TUNNEL SYNDROME 06/03/2008  . HTN (hypertension) 06/03/2008  . ALLERGIC RHINITIS 06/03/2008  . Asthma 06/03/2008  . GERD (gastroesophageal reflux disease) 06/03/2008  . Osteopenia 06/03/2008  . COLONIC POLYPS, HX OF 06/03/2008   Past Medical History:  Diagnosis Date  . ALLERGIC RHINITIS   . ANEMIA-NOS   . Arthritis   . ASTHMA   . Carpal tunnel syndrome   . COLONIC POLYPS, HX OF   . Coronary artery disease    "mild CAD" noted on 12/05/17 in coronary CT scan  . DIABETES  MELLITUS, TYPE II    diet controlled  . GERD   . Hand tingling   . HYPERLIPIDEMIA   . HYPERTENSION   . Neuroendocrine tumor   . OSTEOPENIA   . PONV (postoperative nausea and vomiting)    " it relaxes my bladder muscles and I have been known to pee all over the place"  . Psoriasis    severe, began soriatane 01/2012  . Pulmonary embolism ([place])   . Rectal fissure   . Scoliosis   . Wears glasses     Family History  Problem Relation Age of Onset  . Lung cancer Father   . Arthritis Other        Parents  . Asthma Other        parent, other relative  . Breast cancer Other        other relative  . Hypertension Other        parent, other relative  . Heart disease Other        parent, other relative  . Heart disease Mother   . Asthma Mother   . Breast cancer Maternal Aunt 68  . Parkinson's disease Maternal Grandmother   . Rheumatic fever Maternal Grandfather     Past Surgical History:  Procedure Laterality Date  . benign rectal growth  2004   removed by Dr. Zella Richer  . BIOPSY  08/09/2018   Procedure: BIOPSY;  Surgeon: Carol Ada, MD;  Location: [facility];  Service: Endoscopy;;  . BIOPSY  08/22/2018   Procedure: BIOPSY;  Surgeon: Juanita Craver, MD;  Location: [facility];  Service:  Endoscopy;;  . BIOPSY  03/07/2019   Procedure: BIOPSY;  Surgeon: Irving Copas., MD;  Location: [facility];  Service: Gastroenterology;;  . BREAST BIOPSY Right   . BREAST EXCISIONAL BIOPSY Left   . BREAST SURGERY  1988   biopsy  . CESAREAN SECTION    . COLONOSCOPY N/A 08/22/2018   Procedure: COLONOSCOPY;  Surgeon: Juanita Craver, MD;  Location: [facility] ENDOSCOPY;  Service: Endoscopy;  Laterality: N/A;  . ENDOSCOPIC MUCOSAL RESECTION N/A 03/07/2019   Procedure: ENDOSCOPIC MUCOSAL RESECTION;  Surgeon: Rush Landmark Telford Nab., MD;  Location: [facility];  Service: Gastroenterology;  Laterality: N/A;  . ESOPHAGOGASTRODUODENOSCOPY (EGD) WITH PROPOFOL N/A 08/04/2018   Procedure: ESOPHAGOGASTRODUODENOSCOPY (EGD) WITH PROPOFOL;  Surgeon: Ladene Artist, MD;  Location: [facility];  Service: Gastroenterology;  Laterality: N/A;  . ESOPHAGOGASTRODUODENOSCOPY (EGD) WITH PROPOFOL N/A 03/07/2019   Procedure: ESOPHAGOGASTRODUODENOSCOPY (EGD) WITH PROPOFOL;  Surgeon: Rush Landmark Telford Nab., MD;  Location: [facility];  Service: Gastroenterology;  Laterality: N/A;  . EUS N/A 03/07/2019   Procedure: UPPER ENDOSCOPIC ULTRASOUND (EUS) RADIAL;  Surgeon: Rush Landmark Telford Nab., MD;  Location: [facility];  Service: Gastroenterology;  Laterality: N/A;  . FINE NEEDLE ASPIRATION  03/07/2019   Procedure: FINE NEEDLE ASPIRATION (FNA) LINEAR;  Surgeon: Irving Copas., MD;  Location: [facility];  Service: Gastroenterology;;  . Otho Darner SIGMOIDOSCOPY N/A 08/04/2018   Procedure: FLEXIBLE SIGMOIDOSCOPY;  Surgeon: Ladene Artist, MD;  Location: [facility];  Service: Gastroenterology;  Laterality: N/A;  . FLEXIBLE SIGMOIDOSCOPY N/A 08/09/2018   Procedure: FLEXIBLE SIGMOIDOSCOPY;  Surgeon: Carol Ada, MD;  Location: [facility];  Service: Endoscopy;  Laterality: N/A;  . HEMOSTASIS CLIP PLACEMENT  03/07/2019   Procedure: HEMOSTASIS CLIP PLACEMENT;  Surgeon: Irving Copas., MD;  Location: [facility];  Service: Gastroenterology;;  . Lia Foyer LIFTING INJECTION  03/07/2019   Procedure: SUBMUCOSAL LIFTING INJECTION;  Surgeon: Irving Copas., MD;  Location: [facility];  Service: Gastroenterology;;  . Tamsen Roers BRONCHOSCOPY WITH ENDOBRONCHIAL NAVIGATION N/A 03/14/2018   Procedure: VIDEO BRONCHOSCOPY WITH ENDOBRONCHIAL NAVIGATION;  Surgeon: Collene Gobble, MD;  [facility];  Service: Thoracic;  Laterality: N/A;   Social History   Occupational History  . Occupation: retired Careers adviser  Tobacco Use  . Smoking status: Former Smoker    Packs/day: 1.00    Years: 10.00    Pack years: 10.00    Quit date: 02/14/1974    Years since quitting: 45.6  . Smokeless tobacco: Never Used  Vaping Use  . Vaping Use: Never used  Substance and Sexual Activity  . Alcohol use: No  . Drug use: No  . Sexual activity: Not on file

## 2019-10-14 ENCOUNTER — Telehealth: Payer: Self-pay | Admitting: *Deleted

## 2019-10-14 ENCOUNTER — Other Ambulatory Visit: Payer: Medicare Other | Admitting: *Deleted

## 2019-10-14 ENCOUNTER — Other Ambulatory Visit: Payer: Self-pay

## 2019-10-14 DIAGNOSIS — E785 Hyperlipidemia, unspecified: Secondary | ICD-10-CM

## 2019-10-14 LAB — LIPID PANEL
Chol/HDL Ratio: 2.7 ratio (ref 0.0–4.4)
Cholesterol, Total: 168 mg/dL (ref 100–199)
HDL: 63 mg/dL (ref >39)
LDL Chol Calc (NIH): 79 mg/dL (ref 0–99)
Triglycerides: 150 mg/dL — ABNORMAL HIGH (ref 0–149)
VLDL Cholesterol Cal: 26 mg/dL (ref 5–40)

## 2019-10-14 LAB — HEPATIC FUNCTION PANEL
ALT: 20 IU/L (ref 0–32)
AST: 16 IU/L (ref 0–40)
Albumin: 4.2 g/dL (ref 3.7–4.7)
Alkaline Phosphatase: 115 IU/L (ref 48–121)
Bilirubin Total: 0.4 mg/dL (ref 0.0–1.2)
Bilirubin, Direct: 0.11 mg/dL (ref 0.00–0.40)
Total Protein: 7.2 g/dL (ref 6.0–8.5)

## 2019-10-14 NOTE — Telephone Encounter (Signed)
-----   Message from Dorothy Spark, MD sent at 10/14/2019  4:38 PM EDT ----- Normal lipids and LFTs

## 2019-10-14 NOTE — Telephone Encounter (Signed)
Called the pt to endorse normal lipids and LFTs per Dr. [name].  Pt states that she wanted to let Dr. [name] and our Pharmacist in lipid clinic know that she cannot tolerate taking repatha anymore.  She associates having a terrible reaction to this medication, since starting it.  Pt states she took her last dose of Repatha over 2 weeks ago, skipping last Fridays dose that was due, and noted her symptoms improved.  Pt states while she was taking Repatha, she had extreme back pain, extreme muscle and leg pain, to the point she sat on the floor and physically couldn't get up.  Pt states she had terrible knee pain with this, which prompted her to go in and see her Ortho MD, and he gave her a cortisone injection, which she reports within 8 hours of receiving, she felt pain relief not only in her knee, but both legs and back area.  Pt states other symptoms she has had since being on Repatha is new onset cough and her very first UTI,  which took 2 rounds of antibiotics to get rid of.  Pt states she went to a visit with her Rheumatologist about a week ago, and they did labs on her, but she hasn't heard back from those results yet.  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  Pt states she is very hesitant to proceed with Repatha.  She states it could be causing her issues, or not, but she inquires her symptoms didn't start until around the time she started taking this medication.  Pt has not called into the office to inform our Pharmacist of this, for she wanted to wait it out and see if her symptoms improved while holding this medication.  Pt states she felt the same as she did when she was on statins, but 10 times worse.  Informed the pt that I will need to route this communication to Dr. [name] and our Pharmacist in lipid clinic, to further review and advise on this matter.  Informed the pt that being she is closely followed and managed by our [facility], they will more than likely follow  back up with her, with further advisement.  Pt verbalized understanding and agrees with this plan.

## 2019-10-15 NOTE — Telephone Encounter (Signed)
Patient's symptoms do coincide with known side effects of Repatha.

## 2019-10-15 NOTE — Telephone Encounter (Signed)
Melissa, Do you have any  other suggestions for lipid management for this patient? Thank you, Houston Siren

## 2019-10-15 NOTE — Telephone Encounter (Signed)
Called patient to discuss her side effects and other medication options. Advised patient I would like her to stop taking Repatha. States she is starting to feel better already. She states that [place] also caused her GFR to drop. I advised that Repatha did not affect kidney function. She did not seem to believe me. She has an appointment with Dr. Quay Burow on 9/21. She would like to wait until that appointment before starting any new medications to make sure her functions have recovered. We discussed other medication options for when she feel better. 1. Praluent 75mg  - sometime pt will tolerate on PCSK9i and not the other- technically a little bit of a lower dose as well. 2. Nexletol 3. Rosuvastatin 5mg  two times a week  Patient thanked me for all the information and will call the clinic when she is ready to start new medication

## 2019-10-23 ENCOUNTER — Encounter: Payer: Self-pay | Admitting: Orthopaedic Surgery

## 2019-10-23 ENCOUNTER — Ambulatory Visit (INDEPENDENT_AMBULATORY_CARE_PROVIDER_SITE_OTHER): Payer: Medicare Other | Admitting: Orthopaedic Surgery

## 2019-10-23 DIAGNOSIS — M25552 Pain in left hip: Secondary | ICD-10-CM

## 2019-10-23 DIAGNOSIS — M25561 Pain in right knee: Secondary | ICD-10-CM | POA: Diagnosis not present

## 2019-10-23 DIAGNOSIS — G8929 Other chronic pain: Secondary | ICD-10-CM | POA: Diagnosis not present

## 2019-10-23 NOTE — Progress Notes (Signed)
The patient comes in today for follow-up after having a steroid injection in her right knee.  She says the knee is not 100% but is much better than it was before the injection.  She has chronic left hip pain but normal x-rays on the system that I reviewed again today.  Also her clinical exam her left hip shows full range of motion.  Her right knee today has no effusion but it is slightly warm.  She certainly has dealt with an inflammatory process.  We talked about her knee in detail.  She does wear a knee sleeve when she gets out and exercise walks.  She is getting continue to slowly increase her activities as she is comfortable.  I recommended Voltaren gel.  I think this will be safer for her for someone who is on Eliquis.  All question concerns were answered and addressed.  Follow-up can be as needed.

## 2019-11-04 ENCOUNTER — Other Ambulatory Visit (HOSPITAL_COMMUNITY): Payer: Self-pay

## 2019-11-04 DIAGNOSIS — I700 Atherosclerosis of aorta: Secondary | ICD-10-CM | POA: Insufficient documentation

## 2019-11-04 NOTE — Patient Instructions (Addendum)
  Blood work was ordered.     Flu immunization administered today.     Medications reviewed and updated.  Changes include :   Retry pitavastatin   Your prescription(s) have been submitted to your pharmacy. Please take as directed and contact our office if you believe you are having problem(s) with the medication(s).     Please followup in 6 months or sooner.

## 2019-11-04 NOTE — Progress Notes (Signed)
Subjective:    Patient ID: [name], female    DOB: [birth year], 74 y.o.   MRN: [id number]  HPI The patient is here for follow up of their chronic medical problems, including DM, htn, B12 def, N/V, diarrhea, leg edema, hypomagnesemia.  She is taking all of her medications as prescribed.   BP at home is well controlled - 81/63-125/77.  Her blood pressure is always higher here.   She is having neck pain - it hurts to move her neck.  She has known arthritis.  She has knee pain.  She has left hip pain, which may be coming from her scoliosis.  She intends on going to the [facility].    Having episodic "dizziness" ( rocking boat ) when she gets up.  She sits on the side of the bed.  She is unsure if this is related to her blood pressure or not.  She is trying to walk with her walker a little.    Medications and allergies reviewed with patient and updated if appropriate.  Patient Active Problem List   Diagnosis Date Noted  . Aortic atherosclerosis ([place]) 11/04/2019  . Folic acid deficiency 16/60/6301  . Bilateral leg edema 03/19/2019  . Hemorrhoids 03/19/2019  . B12 deficiency 03/19/2019  . Abnormal endoscopy of upper gastrointestinal tract 02/27/2019  . Iron deficiency anemia 02/19/2019  . Hypomagnesemia   . Inflammatory arthritis 01/02/2019  . Positive ANA (antinuclear antibody) 01/02/2019  . Exocrine pancreatic insufficiency 10/23/2018  . Multiple pulmonary nodules determined by computed tomography of lung 09/18/2018  . Decreased GFR 08/21/2018  . Syncope 08/01/2018  . Hypokalemia 08/01/2018  . Pneumothorax 03/14/2018  . Lung abnormality 01/11/2018  . Pulmonary embolism ([place]) 12/24/2017  . Lung mass 12/24/2017  . Small vessel disease, cerebrovascular 07/31/2017  . Atypical chest pain 06/14/2017  . Hyperuricemia 05/03/2017  . Arthralgia 05/01/2017  . Hair loss 05/01/2017  . Vitamin D deficiency 10/31/2016  . Diastolic dysfunction 60/11/9321  .  Vertigo 12/24/2015  . Fatigue 12/24/2015  . Bilateral carotid artery disease, Mild 05/31/2015  . Thyroid nodule 05/31/2015  . Psoriasis   . Scoliosis   . Diabetes type 2, controlled ([place]) 06/03/2008  . Dyslipidemia 06/03/2008  . CARPAL TUNNEL SYNDROME 06/03/2008  . HTN (hypertension) 06/03/2008  . ALLERGIC RHINITIS 06/03/2008  . Asthma 06/03/2008  . GERD (gastroesophageal reflux disease) 06/03/2008  . Osteopenia 06/03/2008  . COLONIC POLYPS, HX OF 06/03/2008    Current Outpatient Medications on File Prior to Visit  Medication Sig Dispense Refill  . Ascorbic Acid (VITAMIN C GUMMIE PO) Take 1 tablet by mouth every evening.    . Cholecalciferol (VITAMIN D3 GUMMIES ADULT PO) Take 1 tablet by mouth every evening.    . clindamycin (CLEOCIN T) 1 % external solution Apply 1 application topically daily as needed (wounds).     . clobetasol (TEMOVATE) 0.05 % external solution Apply topically.    . Coenzyme Q10 (COQ10 GUMMIES ADULT PO) Take 1 tablet by mouth every evening.    Marland Kitchen CREON 12000 units CPEP capsule Take 12,000 Units by mouth 5 (five) times daily.     Marland Kitchen ELIQUIS 5 MG TABS tablet TAKE 1 TABLET BY MOUTH TWICE A DAY 60 tablet 5  . folic acid (FOLVITE) 1 MG tablet TAKE 1 TABLET BY MOUTH EVERY DAY 30 tablet 5  . halobetasol (ULTRAVATE) 0.05 % cream Apply 1 application topically daily. After bathing    . lisinopril (ZESTRIL) 10 MG tablet Take 1 tablet (  10 mg total) by mouth daily. 30 tablet 5  . loratadine (CLARITIN) 10 MG tablet Take 10 mg by mouth daily as needed for allergies.    . magnesium oxide (MAG-OX) 400 MG tablet TAKE 1 TABLET BY MOUTH EVERY DAY 90 tablet 1  . Multiple Vitamins-Minerals (ADULT GUMMY PO) Take 1 tablet by mouth every evening.    . nebivolol (BYSTOLIC) 2.5 MG tablet TAKE ONE (1) TABLET BY MOUTH EVERY DAY 30 tablet 5  . nystatin-triamcinolone (MYCOLOG II) cream Apply 1 application topically 2 (two) times daily. 30 g 0  . omeprazole (PRILOSEC OTC) 20 MG tablet Take 20 mg  by mouth daily.    . ondansetron (ZOFRAN-ODT) 8 MG disintegrating tablet Take 8 mg by mouth every 8 (eight) hours as needed.    [name] test strip USE TEST STRIPS TO CHECK BLOOD SUGAR AT LEAST 3 TIMES DAILY 300 strip 1  . oxymetazoline (AFRIN) 0.05 % nasal spray Place 1 spray into both nostrils 2 (two) times daily.    Vladimir Faster Glycol-Propyl Glycol (LUBRICANT EYE DROPS) 0.4-0.3 % SOLN Place 1 drop into both eyes daily as needed (dry/irritated eyes.).    Marland Kitchen vitamin B-12 (CYANOCOBALAMIN) 1000 MCG tablet Take 1,000 mcg by mouth daily.     No current facility-administered medications on file prior to visit.    Past Medical History:  Diagnosis Date  . ALLERGIC RHINITIS   . ANEMIA-NOS   . Arthritis   . ASTHMA   . Carpal tunnel syndrome   . COLONIC POLYPS, HX OF   . Coronary artery disease    "mild CAD" noted on 12/05/17 in coronary CT scan  . DIABETES MELLITUS, TYPE II    diet controlled  . GERD   . Hand tingling   . HYPERLIPIDEMIA   . HYPERTENSION   . Neuroendocrine tumor   . OSTEOPENIA   . PONV (postoperative nausea and vomiting)    " it relaxes my bladder muscles and I have been known to pee all over the place"  . Psoriasis    severe, began soriatane 01/2012  . Pulmonary embolism ([place])   . Rectal fissure   . Scoliosis   . Wears glasses     Past Surgical History:  Procedure Laterality Date  . benign rectal growth  2004   removed by Dr. Zella Richer  . BIOPSY  08/09/2018   Procedure: BIOPSY;  Surgeon: Carol Ada, MD;  Location: [place];  Service: Endoscopy;;  . BIOPSY  08/22/2018   Procedure: BIOPSY;  Surgeon: Juanita Craver, MD;  Location: [place];  Service: Endoscopy;;  . BIOPSY  03/07/2019   Procedure: BIOPSY;  Surgeon: Irving Copas., MD;  Location: [place];  Service: Gastroenterology;;  . BREAST BIOPSY Right   . BREAST EXCISIONAL BIOPSY Left   . BREAST SURGERY  1988   biopsy  . CESAREAN SECTION    . COLONOSCOPY N/A 08/22/2018    Procedure: COLONOSCOPY;  Surgeon: Juanita Craver, MD;  Location: [facility] ENDOSCOPY;  Service: Endoscopy;  Laterality: N/A;  . ENDOSCOPIC MUCOSAL RESECTION N/A 03/07/2019   Procedure: ENDOSCOPIC MUCOSAL RESECTION;  Surgeon: Rush Landmark Telford Nab., MD;  Location: [place];  Service: Gastroenterology;  Laterality: N/A;  . ESOPHAGOGASTRODUODENOSCOPY (EGD) WITH PROPOFOL N/A 08/04/2018   Procedure: ESOPHAGOGASTRODUODENOSCOPY (EGD) WITH PROPOFOL;  Surgeon: Ladene Artist, MD;  Location: [place];  Service: Gastroenterology;  Laterality: N/A;  . ESOPHAGOGASTRODUODENOSCOPY (EGD) WITH PROPOFOL N/A 03/07/2019   Procedure: ESOPHAGOGASTRODUODENOSCOPY (EGD) WITH PROPOFOL;  Surgeon: Rush Landmark Telford Nab., MD;  Location:  [place] [facility];  Service: Gastroenterology;  Laterality: N/A;  . EUS N/A 03/07/2019   Procedure: UPPER ENDOSCOPIC ULTRASOUND (EUS) RADIAL;  Surgeon: Rush Landmark Telford Nab., MD;  Location: [place];  Service: Gastroenterology;  Laterality: N/A;  . FINE NEEDLE ASPIRATION  03/07/2019   Procedure: FINE NEEDLE ASPIRATION (FNA) LINEAR;  Surgeon: Irving Copas., MD;  Location: [place];  Service: Gastroenterology;;  . Otho Darner SIGMOIDOSCOPY N/A 08/04/2018   Procedure: FLEXIBLE SIGMOIDOSCOPY;  Surgeon: Ladene Artist, MD;  Location: [place];  Service: Gastroenterology;  Laterality: N/A;  . FLEXIBLE SIGMOIDOSCOPY N/A 08/09/2018   Procedure: FLEXIBLE SIGMOIDOSCOPY;  Surgeon: Carol Ada, MD;  Location: [place];  Service: Endoscopy;  Laterality: N/A;  . HEMOSTASIS CLIP PLACEMENT  03/07/2019   Procedure: HEMOSTASIS CLIP PLACEMENT;  Surgeon: Irving Copas., MD;  Location: [place];  Service: Gastroenterology;;  . Lia Foyer LIFTING INJECTION  03/07/2019   Procedure: SUBMUCOSAL LIFTING INJECTION;  Surgeon: Irving Copas., MD;  Location: [place];  Service: Gastroenterology;;  . VIDEO BRONCHOSCOPY WITH ENDOBRONCHIAL NAVIGATION N/A 03/14/2018   Procedure:  VIDEO BRONCHOSCOPY WITH ENDOBRONCHIAL NAVIGATION;  Surgeon: Collene Gobble, MD;  Location[facility];  Service: Thoracic;  Laterality: N/A;    Social History   Socioeconomic History  . Marital status: Single    Spouse name: Not on file  . Number of children: 1  . Years of education: 73  . Highest education level: Bachelor's degree (e.g., BA, AB, BS)  Occupational History  . Occupation: retired Careers adviser  Tobacco Use  . Smoking status: Former Smoker    Packs/day: 1.00    Years: 10.00    Pack years: 10.00    Quit date: 02/14/1974    Years since quitting: 45.7  . Smokeless tobacco: Never Used  Vaping Use  . Vaping Use: Never used  Substance and Sexual Activity  . Alcohol use: No  . Drug use: No  . Sexual activity: Not on file  Other Topics Concern  . Not on file  Social History Narrative   Lives alone in a one story home.  Has one child and one grandchild.  Retired Careers adviser.  Education: college.    Social Determinants of Health   Financial Resource Strain:   . Difficulty of Paying Living Expenses: Not on file  Food Insecurity:   . Worried About Charity fundraiser in the Last Year: Not on file  . Ran Out of Food in the Last Year: Not on file  Transportation Needs:   . Lack of Transportation (Medical): Not on file  . Lack of Transportation (Non-Medical): Not on file  Physical Activity:   . Days of Exercise per Week: Not on file  . Minutes of Exercise per Session: Not on file  Stress:   . Feeling of Stress : Not on file  Social Connections:   . Frequency of Communication with Friends and Family: Not on file  . Frequency of Social Gatherings with Friends and Family: Not on file  . Attends Religious Services: Not on file  . Active Member of Clubs or Organizations: Not on file  . Attends Archivist Meetings: Not on file  . Marital Status: Not on file    Family History  Problem Relation Age of  Onset  . Lung cancer Father   . Arthritis Other        Parents  . Asthma Other        parent, other relative  .  Breast cancer Other        other relative  . Hypertension Other        parent, other relative  . Heart disease Other        parent, other relative  . Heart disease Mother   . Asthma Mother   . Breast cancer Maternal Aunt 68  . Parkinson's disease Maternal Grandmother   . Rheumatic fever Maternal Grandfather     Review of Systems  Constitutional: Positive for fatigue. Negative for appetite change and fever.  Respiratory: Positive for cough. Negative for shortness of breath and wheezing.   Cardiovascular: Positive for leg swelling (Right leg from R knee). Negative for chest pain and palpitations.  Gastrointestinal: Positive for constipation. Negative for abdominal pain, diarrhea and nausea (intermittent x 1 month).       GERD  Genitourinary: Negative for dysuria and hematuria.  Neurological: Positive for light-headedness. Negative for dizziness and headaches.       Objective:   Vitals:   11/05/19 0752  BP: (!) 180/100  Pulse: 63  Temp: 98.6 F (37 C)  SpO2: 98%   BP Readings from Last 3 Encounters:  11/05/19 (!) 180/100  09/20/19 (!) 148/90  09/11/19 (!) 150/78   Wt Readings from Last 3 Encounters:  11/05/19 146 lb 8 oz (66.5 kg)  09/20/19 142 lb (64.4 kg)  09/11/19 141 lb (64 kg)   Body mass index is 27.68 kg/m.   Physical Exam    Constitutional: Appears well-developed and well-nourished. No distress.  HENT:  Head: Normocephalic and atraumatic.  Neck: Neck supple. No tracheal deviation present. No thyromegaly present.  No cervical lymphadenopathy Cardiovascular: Normal rate, regular rhythm and normal heart sounds.   No murmur heard. No carotid bruit .  No edema Pulmonary/Chest: Effort normal and breath sounds normal. No respiratory distress. No has no wheezes. No rales.  Skin: Skin is warm and dry. Not diaphoretic.  Psychiatric: Normal mood and  affect. Behavior is normal.      Assessment & Plan:    See Problem List for Assessment and Plan of chronic medical problems.    This visit occurred during the SARS-CoV-2 public health emergency.  Safety protocols were in place, including screening questions prior to the visit, additional usage of staff PPE, and extensive cleaning of exam room while observing appropriate contact time as indicated for disinfecting solutions.

## 2019-11-05 ENCOUNTER — Ambulatory Visit (INDEPENDENT_AMBULATORY_CARE_PROVIDER_SITE_OTHER): Payer: Medicare Other | Admitting: Internal Medicine

## 2019-11-05 ENCOUNTER — Encounter: Payer: Self-pay | Admitting: Internal Medicine

## 2019-11-05 ENCOUNTER — Other Ambulatory Visit: Payer: Self-pay

## 2019-11-05 VITALS — BP 180/100 | HR 63 | Temp 98.6°F | Ht 61.0 in | Wt 146.5 lb

## 2019-11-05 DIAGNOSIS — I10 Essential (primary) hypertension: Secondary | ICD-10-CM

## 2019-11-05 DIAGNOSIS — I2782 Chronic pulmonary embolism: Secondary | ICD-10-CM

## 2019-11-05 DIAGNOSIS — R0609 Other forms of dyspnea: Secondary | ICD-10-CM

## 2019-11-05 DIAGNOSIS — Z23 Encounter for immunization: Secondary | ICD-10-CM | POA: Diagnosis not present

## 2019-11-05 DIAGNOSIS — I2609 Other pulmonary embolism with acute cor pulmonale: Secondary | ICD-10-CM

## 2019-11-05 DIAGNOSIS — E119 Type 2 diabetes mellitus without complications: Secondary | ICD-10-CM

## 2019-11-05 DIAGNOSIS — R600 Localized edema: Secondary | ICD-10-CM | POA: Diagnosis not present

## 2019-11-05 DIAGNOSIS — Z789 Other specified health status: Secondary | ICD-10-CM

## 2019-11-05 DIAGNOSIS — E785 Hyperlipidemia, unspecified: Secondary | ICD-10-CM

## 2019-11-05 DIAGNOSIS — I25118 Atherosclerotic heart disease of native coronary artery with other forms of angina pectoris: Secondary | ICD-10-CM

## 2019-11-05 DIAGNOSIS — E782 Mixed hyperlipidemia: Secondary | ICD-10-CM

## 2019-11-05 DIAGNOSIS — E538 Deficiency of other specified B group vitamins: Secondary | ICD-10-CM

## 2019-11-05 DIAGNOSIS — R0600 Dyspnea, unspecified: Secondary | ICD-10-CM

## 2019-11-05 DIAGNOSIS — I700 Atherosclerosis of aorta: Secondary | ICD-10-CM

## 2019-11-05 DIAGNOSIS — E559 Vitamin D deficiency, unspecified: Secondary | ICD-10-CM

## 2019-11-05 MED ORDER — PITAVASTATIN CALCIUM 1 MG PO TABS
1.0000 mg | ORAL_TABLET | ORAL | 2 refills | Status: DC
Start: 2019-11-06 — End: 2020-04-02

## 2019-11-05 NOTE — Assessment & Plan Note (Signed)
Chronic Taking folic acid daily We will check level and consider discontinuing supplement

## 2019-11-05 NOTE — Assessment & Plan Note (Signed)
Chronic Controlled

## 2019-11-05 NOTE — Assessment & Plan Note (Signed)
Chronic Has not tolerated several statins Did not tolerate Repatha Zetia not effective She is willing to retry pitavastatin 1 mg 2-3 times a week-can increase if tolerated Has follow-up with Dr. [name] in 3 months and can reevaluate other options at that time Increase walking

## 2019-11-05 NOTE — Assessment & Plan Note (Addendum)
Chronic Not currently on medication Has not tolerated several statins, repatha Will retry pitavastatin 1 mg 2-3 times a week

## 2019-11-05 NOTE — Assessment & Plan Note (Signed)
Chronic Has had low magnesium on more than one occasion She is now eating well and does not have diarrhea Taking magnesium daily Check level and will consider discontinuing supplement

## 2019-11-05 NOTE — Assessment & Plan Note (Signed)
Chronic Taking oral supplementation We will check vitamin B12 level Advised to continue daily B12

## 2019-11-05 NOTE — Assessment & Plan Note (Signed)
Unprovoked-needs lifelong anticoagulation On Eliquis 5 mg twice daily CBC, CMP

## 2019-11-05 NOTE — Assessment & Plan Note (Signed)
Chronic Taking vitamin D We will check level

## 2019-11-05 NOTE — Assessment & Plan Note (Signed)
Chronic Blood pressure low at home and typically elevated at the doctor's office-?  Whitecoat hypertension Advised her to bring her cuff to her next appointment with Dr. [name] Continue current medications CMP

## 2019-11-05 NOTE — Assessment & Plan Note (Signed)
Chronic Diet controlled Check A1c Encouraged increasing her walking

## 2019-11-06 LAB — LIPID PANEL
Cholesterol: 275 mg/dL — ABNORMAL HIGH (ref <200)
HDL: 58 mg/dL (ref >=50)
LDL Cholesterol (Calc): 176 mg/dL (calc) — ABNORMAL HIGH
Non-HDL Cholesterol (Calc): 217 mg/dL (calc) — ABNORMAL HIGH (ref <130)
Total CHOL/HDL Ratio: 4.7 (calc) (ref <5.0)
Triglycerides: 243 mg/dL — ABNORMAL HIGH (ref <150)

## 2019-11-06 LAB — BASIC METABOLIC PANEL WITH GFR
BUN/Creatinine Ratio: 17 (calc) (ref 6–22)
BUN: 18 mg/dL (ref 7–25)
CO2: 27 mmol/L (ref 20–32)
Calcium: 9.9 mg/dL (ref 8.6–10.4)
Chloride: 103 mmol/L (ref 98–110)
Creat: 1.08 mg/dL — ABNORMAL HIGH (ref 0.60–0.93)
GFR, Est African American: 58 mL/min/{1.73_m2} — ABNORMAL LOW (ref >=60)
GFR, Est Non African American: 50 mL/min/{1.73_m2} — ABNORMAL LOW (ref >=60)
Glucose, Bld: 104 mg/dL — ABNORMAL HIGH (ref 65–99)
Potassium: 4.6 mmol/L (ref 3.5–5.3)
Sodium: 140 mmol/L (ref 135–146)

## 2019-11-06 LAB — HEMOGLOBIN A1C
Hgb A1c MFr Bld: 6.3 % of total Hgb — ABNORMAL HIGH (ref <5.7)
Mean Plasma Glucose: 134 (calc)
eAG (mmol/L): 7.4 (calc)

## 2019-11-06 LAB — MAGNESIUM: Magnesium: 1.9 mg/dL (ref 1.5–2.5)

## 2019-11-06 LAB — VITAMIN D 25 HYDROXY (VIT D DEFICIENCY, FRACTURES): Vit D, 25-Hydroxy: 42 ng/mL (ref 30–100)

## 2019-11-06 LAB — FOLATE: Folate: >24 ng/mL

## 2019-11-06 LAB — VITAMIN B12: Vitamin B-12: 1783 pg/mL — ABNORMAL HIGH (ref 200–1100)

## 2019-11-28 IMAGING — CT CT ABDOMEN AND PELVIS WITHOUT CONTRAST
2 of 4 series · 16 of 46 positions shown, 18 images · non-contrast
Comparison: CT 08/01/2018.  CT 07/27/2018

CLINICAL DATA: Persistent nausea, vomiting, diarrhea. Recent
hospitalization for seen.

EXAM:
CT ABDOMEN AND PELVIS WITHOUT CONTRAST
TECHNIQUE: Multidetector CT imaging of the abdomen and pelvis was performed
following the standard protocol without IV contrast.

[Series 3: ap without · axial · non-contrast · 0.78mm/px · z∈[+633,+1028]mm · 13 of 91 slices shown, 15 images]
[im 6/91  soft-tissue]
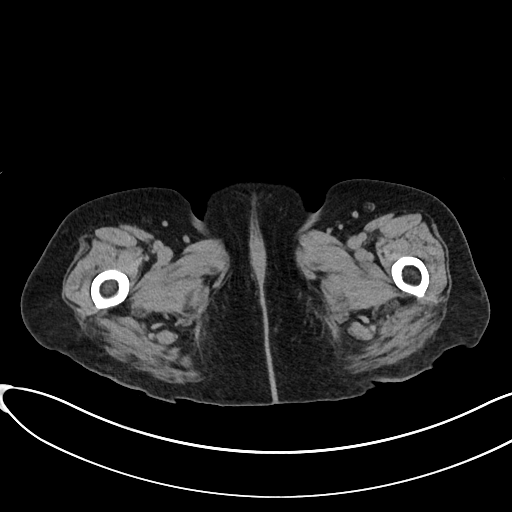
[im 6/91  bone]
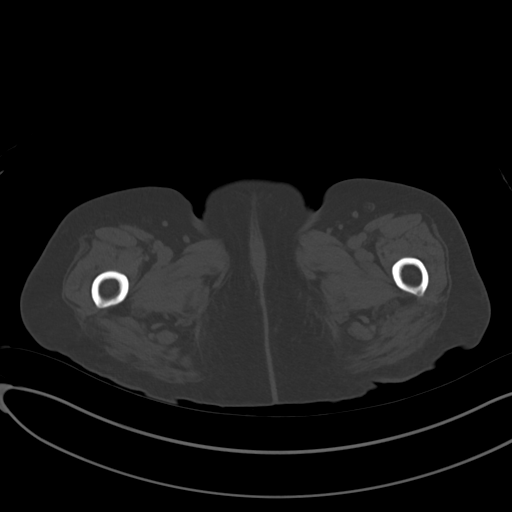
[im 11/91  soft-tissue]
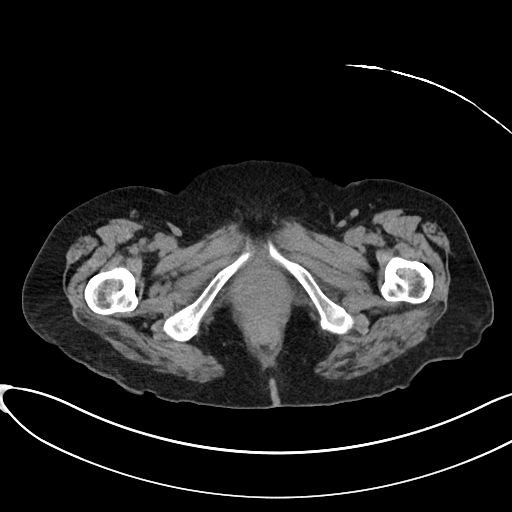
[im 22/91  soft-tissue]
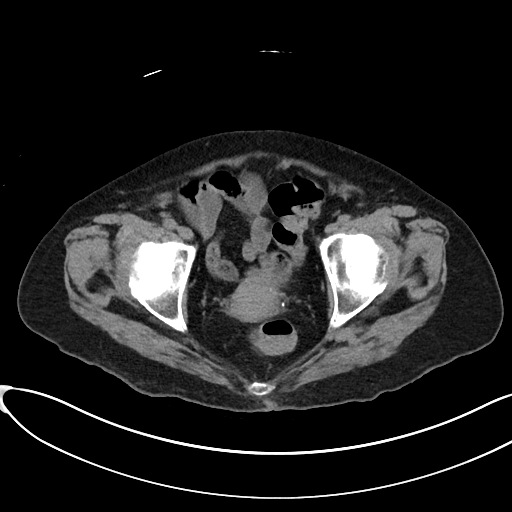
[im 27/91  soft-tissue]
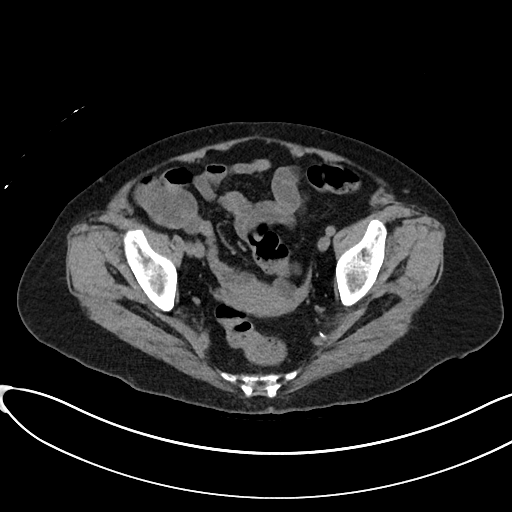
[im 32/91  soft-tissue]
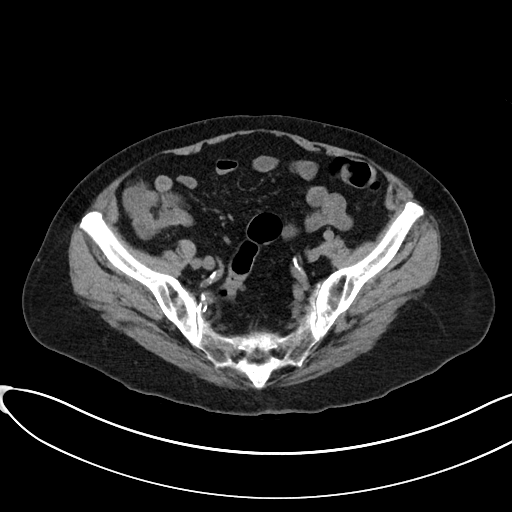
[im 38/91  soft-tissue]
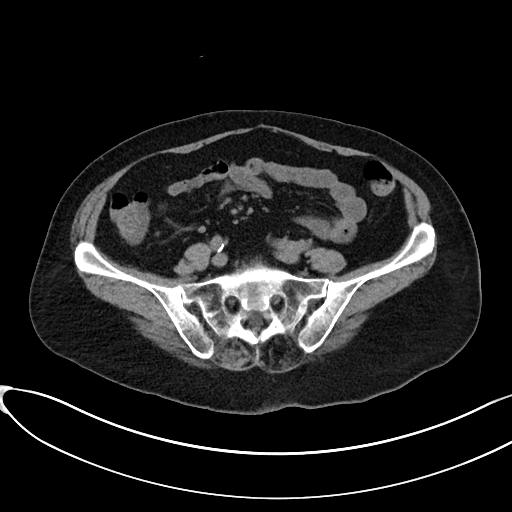
[im 48/91  soft-tissue]
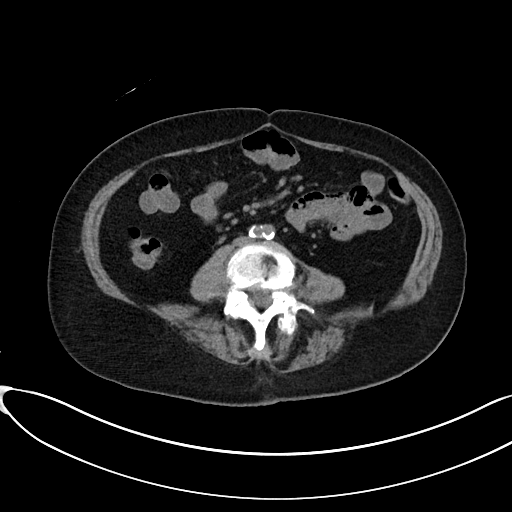
[im 53/91  soft-tissue]
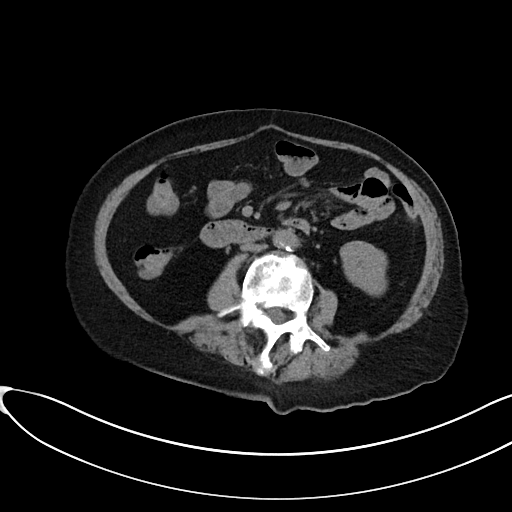
[im 59/91  soft-tissue]
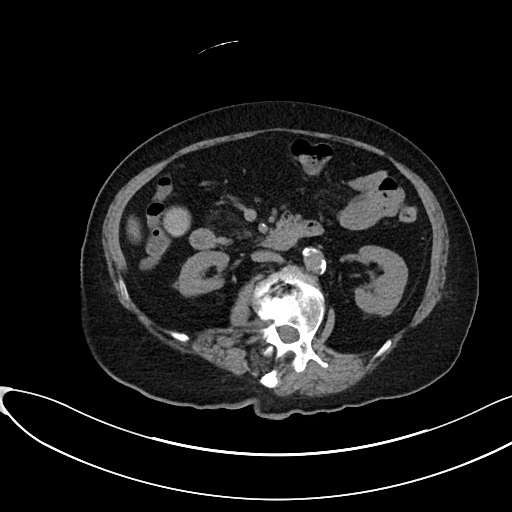
[im 59/91  bone]
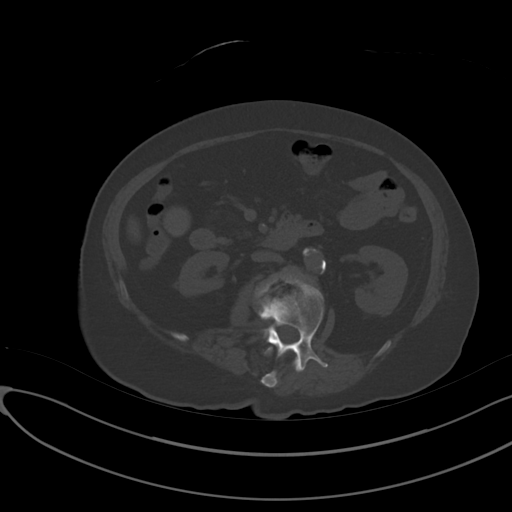
[im 64/91  soft-tissue]
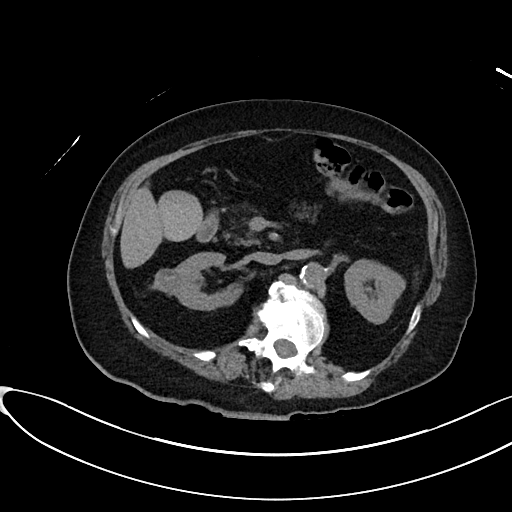
[im 69/91  soft-tissue]
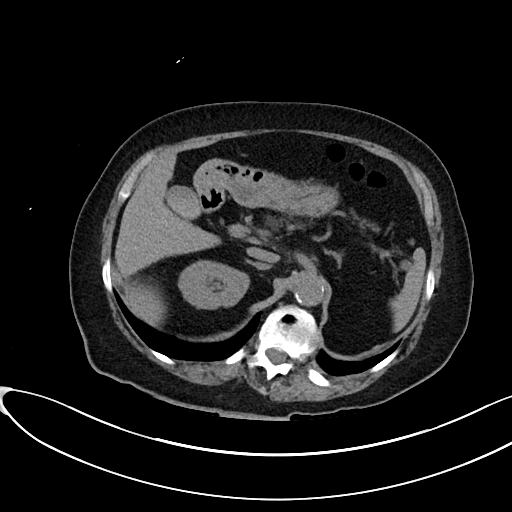
[im 80/91  soft-tissue]
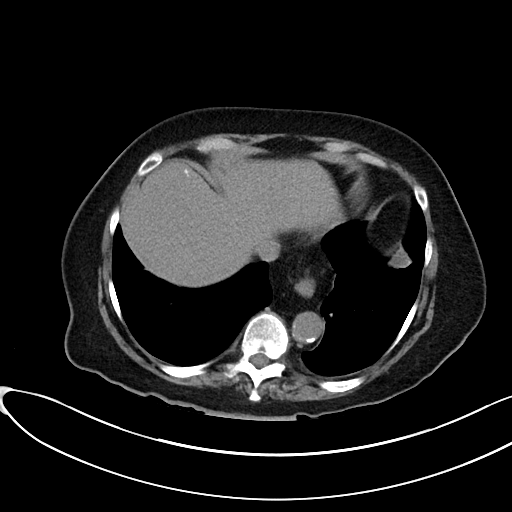
[im 85/91  soft-tissue]
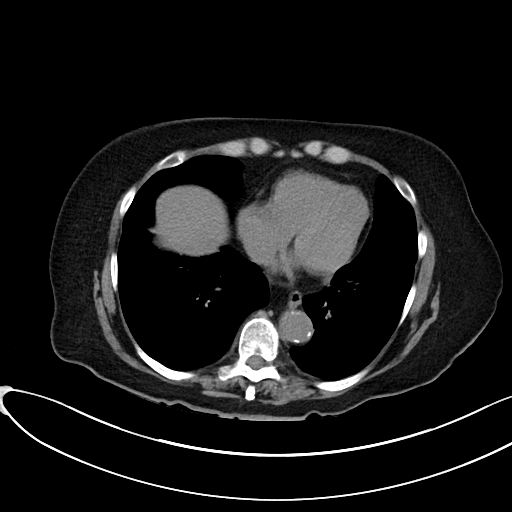

[Series 6: cor · coronal · 0.88mm/px · 3 of 88 slices shown]
[im 30/88  soft-tissue]
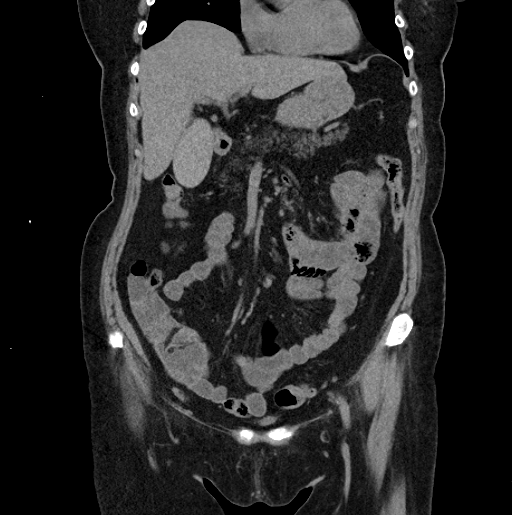
[im 39/88  soft-tissue]
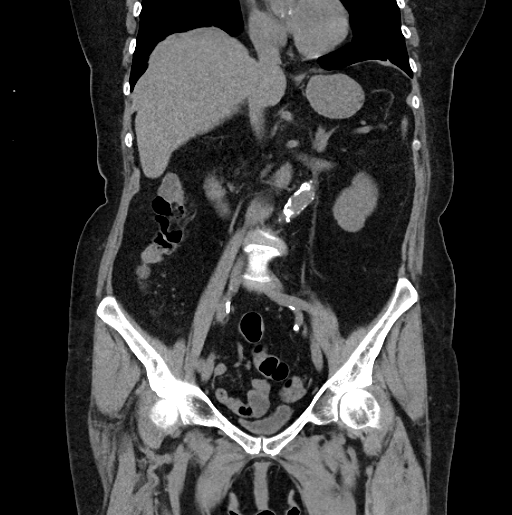
[im 49/88  soft-tissue]
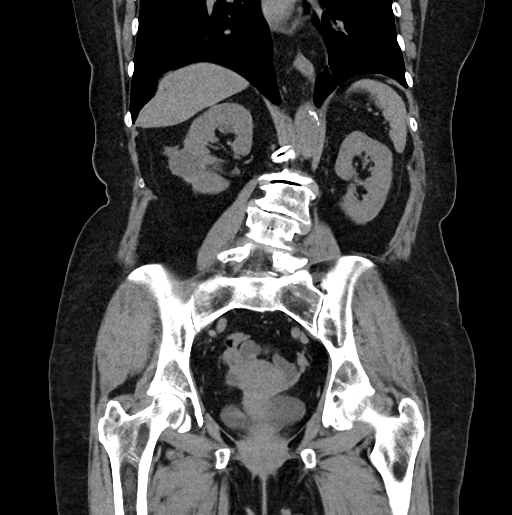

[16 of 46 positions shown; findings below may reference images not displayed]

FINDINGS: Lower chest: Chronic scattered bilateral pulmonary opacities which
are not significantly changed from prior exam, recently evaluated
with chest CT. No acute airspace disease or pleural fluid.

Hepatobiliary: No focal hepatic abnormality. Persistent high-density
material in the gallbladder. No pericholecystic inflammation. No
biliary dilatation.

Pancreas: Fatty atrophy.  No ductal dilatation or inflammation.

Spleen: Normal in size without focal abnormality.

Adrenals/Urinary Tract: Normal adrenal glands. No hydronephrosis or
perinephric edema. Unchanged cystic lesion in the right mid kidney
from recent exams. Urinary bladder is nondistended. No bladder wall
thickening.

Stomach/Bowel: Small hiatal hernia. Stomach is nondistended. No
bowel obstruction or inflammation. Similar fluid-filled small bowel
from prior exam. Liquid stool in the proximal colon, fluid within
the rectum. Minimal colonic diverticulosis. No diverticulitis. Colon
without colonic wall thickening or inflammation.

Vascular/Lymphatic: Small central mesenteric nodes, unchanged from
prior exam. No enlarged lymph nodes in the abdomen or pelvis. Aortic
atherosclerosis and tortuosity. No aneurysm.

Reproductive: Unenhanced uterus and bilateral adnexa are
unremarkable. Hypodensity in the uterus on contrast-enhanced exam
not appreciated.

Other: No free air, free fluid, or intra-abdominal fluid collection.

Musculoskeletal: Scoliosis and degenerative change. There are no
acute or suspicious osseous abnormalities.
IMPRESSION: 1. Similar fluid-filled bowel to prior exam, can be seen with
unspecified enteritis. Liquid stool in portions of the colon
consistent with diarrhea. No bowel inflammation.
2. High-density material in the gallbladder, favor sludge.
3. No other acute findings. Multiple chronic findings are stable
from recent exams.
4.  Aortic Atherosclerosis ([id number].L).

## 2020-01-01 ENCOUNTER — Other Ambulatory Visit: Payer: Self-pay | Admitting: Internal Medicine

## 2020-01-24 ENCOUNTER — Ambulatory Visit: Payer: Medicare Other | Admitting: Physician Assistant

## 2020-01-30 ENCOUNTER — Ambulatory Visit: Payer: Medicare Other | Admitting: Cardiology

## 2020-02-21 ENCOUNTER — Other Ambulatory Visit: Payer: Self-pay

## 2020-02-23 NOTE — Progress Notes (Signed)
Subjective:    Patient ID: [name], female    DOB: 01-13-1946, 75 y.o.   MRN: [id number]  HPI The patient is here for follow up of their chronic medical problems, including htn, DM, dyslipidemia, leg edema, vitamin def,   BP at home - 99/68 - 131/60.  Pulse 56-70.  She has not been checking her sugars.    She stopped the cholesterol medication bc of leg and back pain. It did get better but now comes and goes.  She has been off of it for one month.  She is thinking about restarting it maybe once a week and see how she does with it.  She knows she should be on it.   She is having muscle and joint pain on and off even w/o medication.  She has joint stiffness - that improves, but does not go away.    She is not sleeping well.  She often only gets 3-4 hours at night.    She is not exercising.  She has not been eating well and has gained weight.  Taking nausea 1/2 tab once - twice a week.  Has been worse since Creon was reduced.  She is slowly decreasing Creon per GI.  She is starting to feel increased nausea and is not sure if she wants to decrease any more.    She is still constipated.      Medications and allergies reviewed with patient and updated if appropriate.  Patient Active Problem List   Diagnosis Date Noted  . CKD (chronic kidney disease) stage 3, GFR 30-59 ml/min (HCC) 02/24/2020  . 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 Aortic atherosclerosis ([place]) 11/04/2019  . Folic acid deficiency 89/21/1941  . Bilateral leg edema 03/19/2019  . Hemorrhoids 03/19/2019  . B12 deficiency 03/19/2019  . Abnormal endoscopy of upper gastrointestinal tract 02/27/2019  . Iron deficiency anemia 02/19/2019  . Hypomagnesemia   . Inflammatory arthritis 01/02/2019  . Positive ANA (antinuclear antibody) 01/02/2019  . Exocrine pancreatic insufficiency 10/23/2018  . Multiple pulmonary nodules determined by computed tomography of lung 09/18/2018  . Decreased GFR  08/21/2018  . Syncope 08/01/2018  . Hypokalemia 08/01/2018  . Pneumothorax 03/14/2018  . Lung abnormality 01/11/2018  . Pulmonary embolism ([place]) 12/24/2017  . Lung mass 12/24/2017  . Small vessel disease, cerebrovascular 07/31/2017  . Atypical chest pain 06/14/2017  . Hyperuricemia 05/03/2017  . Arthralgia 05/01/2017  . Hair loss 05/01/2017  . Vitamin D deficiency 10/31/2016  . Diastolic dysfunction 74/09/1446  . Vertigo 12/24/2015  . Fatigue 12/24/2015  . Bilateral carotid artery disease, Mild 05/31/2015  . Thyroid nodule 05/31/2015  . Psoriasis   . Scoliosis   . Diabetes type 2, controlled ([place]) 06/03/2008  . Dyslipidemia 06/03/2008  . CARPAL TUNNEL SYNDROME 06/03/2008  . HTN (hypertension) 06/03/2008  . ALLERGIC RHINITIS 06/03/2008  . Asthma 06/03/2008  . GERD (gastroesophageal reflux disease) 06/03/2008  . Osteopenia 06/03/2008  . COLONIC POLYPS, HX OF 06/03/2008    Current Outpatient Medications on File Prior to Visit  Medication Sig Dispense Refill  . Ascorbic Acid (VITAMIN C GUMMIE PO) Take 1 tablet by mouth every evening.    . Cholecalciferol (VITAMIN D3 GUMMIES ADULT PO) Take 1 tablet by mouth every evening.    . clindamycin (CLEOCIN T) 1 % external solution Apply 1 application topically daily as needed (wounds).     . clobetasol (TEMOVATE) 0.05 % external solution Apply topically.    . Coenzyme Q10 (COQ10 GUMMIES ADULT  PO) Take 1 tablet by mouth every evening.    Marland Kitchen CREON 36000-114000 units CPEP capsule Take by mouth.    . halobetasol (ULTRAVATE) 0.05 % cream Apply 1 application topically daily. After bathing    . loratadine (CLARITIN) 10 MG tablet Take 10 mg by mouth daily as needed for allergies.    . Multiple Vitamins-Minerals (ADULT GUMMY PO) Take 1 tablet by mouth every evening.    . nebivolol (BYSTOLIC) 2.5 MG tablet TAKE 1 TABLET BY MOUTH EVERY DAY 30 tablet 5  . nystatin-triamcinolone (MYCOLOG II) cream Apply 1 application topically 2 (two) times daily. 30  g 0  . omeprazole (PRILOSEC OTC) 20 MG tablet Take 20 mg by mouth daily.    [name] test strip USE TEST STRIPS TO CHECK BLOOD SUGAR AT LEAST 3 TIMES DAILY 300 strip 1  . oxymetazoline (AFRIN) 0.05 % nasal spray Place 1 spray into both nostrils 2 (two) times daily.    . Pitavastatin Calcium 1 MG TABS Take 1 tablet (1 mg total) by mouth 3 (three) times a week. 45 tablet 2  . Polyethyl Glycol-Propyl Glycol (LUBRICANT EYE DROPS) 0.4-0.3 % SOLN Place 1 drop into both eyes daily as needed (dry/irritated eyes.).    Marland Kitchen vitamin B-12 (CYANOCOBALAMIN) 1000 MCG tablet Take 1,000 mcg by mouth daily.     No current facility-administered medications on file prior to visit.    Past Medical History:  Diagnosis Date  . ALLERGIC RHINITIS   . ANEMIA-NOS   . Arthritis   . ASTHMA   . Carpal tunnel syndrome   . COLONIC POLYPS, HX OF   . Coronary artery disease    "mild CAD" noted on 12/05/17 in coronary CT scan  . DIABETES MELLITUS, TYPE II    diet controlled  . GERD   . Hand tingling   . HYPERLIPIDEMIA   . HYPERTENSION   . Neuroendocrine tumor   . OSTEOPENIA   . PONV (postoperative nausea and vomiting)    " it relaxes my bladder muscles and I have been known to pee all over the place"  . Psoriasis    severe, began soriatane 01/2012  . Pulmonary embolism ([place])   . Rectal fissure   . Scoliosis   . Wears glasses     Past Surgical History:  Procedure Laterality Date  . benign rectal growth  2004   removed by Dr. Zella Richer  . BIOPSY  08/09/2018   Procedure: BIOPSY;  Surgeon: Carol Ada, MD;  Location: [facility];  Service: Endoscopy;;  . BIOPSY  08/22/2018   Procedure: BIOPSY;  Surgeon: Juanita Craver, MD;  Location: [facility];  Service: Endoscopy;;  . BIOPSY  03/07/2019   Procedure: BIOPSY;  Surgeon: Irving Copas., MD;  Location: [facility];  Service: Gastroenterology;;  . BREAST BIOPSY Right   . BREAST EXCISIONAL BIOPSY Left   . BREAST SURGERY  1988   biopsy  .  CESAREAN SECTION    . COLONOSCOPY N/A 08/22/2018   Procedure: COLONOSCOPY;  Surgeon: Juanita Craver, MD;  Location: [facility] ENDOSCOPY;  Service: Endoscopy;  Laterality: N/A;  . ENDOSCOPIC MUCOSAL RESECTION N/A 03/07/2019   Procedure: ENDOSCOPIC MUCOSAL RESECTION;  Surgeon: Rush Landmark Telford Nab., MD;  Location: [facility];  Service: Gastroenterology;  Laterality: N/A;  . ESOPHAGOGASTRODUODENOSCOPY (EGD) WITH PROPOFOL N/A 08/04/2018   Procedure: ESOPHAGOGASTRODUODENOSCOPY (EGD) WITH PROPOFOL;  Surgeon: Ladene Artist, MD;  Location: [facility];  Service: Gastroenterology;  Laterality: N/A;  . ESOPHAGOGASTRODUODENOSCOPY (EGD) WITH PROPOFOL N/A 03/07/2019   Procedure:  ESOPHAGOGASTRODUODENOSCOPY (EGD) WITH PROPOFOL;  Surgeon: Mansouraty, Telford Nab., MD;  Location: [place];  Service: Gastroenterology;  Laterality: N/A;  . EUS N/A 03/07/2019   Procedure: UPPER ENDOSCOPIC ULTRASOUND (EUS) RADIAL;  Surgeon: Rush Landmark Telford Nab., MD;  Location: [facility];  Service: Gastroenterology;  Laterality: N/A;  . FINE NEEDLE ASPIRATION  03/07/2019   Procedure: FINE NEEDLE ASPIRATION (FNA) LINEAR;  Surgeon: Irving Copas., MD;  Location: [facility];  Service: Gastroenterology;;  . Otho Darner SIGMOIDOSCOPY N/A 08/04/2018   Procedure: FLEXIBLE SIGMOIDOSCOPY;  Surgeon: Ladene Artist, MD;  Location: [place];  Service: Gastroenterology;  Laterality: N/A;  . FLEXIBLE SIGMOIDOSCOPY N/A 08/09/2018   Procedure: FLEXIBLE SIGMOIDOSCOPY;  Surgeon: Carol Ada, MD;  Location: [facility];  Service: Endoscopy;  Laterality: N/A;  . HEMOSTASIS CLIP PLACEMENT  03/07/2019   Procedure: HEMOSTASIS CLIP PLACEMENT;  Surgeon: Irving Copas., MD;  Location: [facility];  Service: Gastroenterology;;  . Lia Foyer LIFTING INJECTION  03/07/2019   Procedure: SUBMUCOSAL LIFTING INJECTION;  Surgeon: Irving Copas., MD;  Location: [facility] [place];  Service: Gastroenterology;;  . VIDEO BRONCHOSCOPY WITH  ENDOBRONCHIAL NAVIGATION N/A 03/14/2018   Procedure: VIDEO BRONCHOSCOPY WITH ENDOBRONCHIAL NAVIGATION;  Surgeon: Collene Gobble, MD;  Location: [facility];  Service: Thoracic;  Laterality: N/A;    Social History   Socioeconomic History  . Marital status: Single    Spouse name: Not on file  . Number of children: 1  . Years of education: 74  . Highest education level: Bachelor's degree (e.g., BA, AB, BS)  Occupational History  . Occupation: retired Careers adviser  Tobacco Use  . Smoking status: Former Smoker    Packs/day: 1.00    Years: 10.00    Pack years: 10.00    Quit date: 02/14/1974    Years since quitting: 46.0  . Smokeless tobacco: Never Used  Vaping Use  . Vaping Use: Never used  Substance and Sexual Activity  . Alcohol use: No  . Drug use: No  . Sexual activity: Not on file  Other Topics Concern  . Not on file  Social History Narrative   Lives alone in a one story home.  Has one child and one grandchild.  Retired Careers adviser.  Education: college.    Social Determinants of Health   Financial Resource Strain: Low Risk   . Difficulty of Paying Living Expenses: Not hard at all  Food Insecurity: No Food Insecurity  . Worried About Charity fundraiser in the Last Year: Never true  . Ran Out of Food in the Last Year: Never true  Transportation Needs: No Transportation Needs  . Lack of Transportation (Medical): No  . Lack of Transportation (Non-Medical): No  Physical Activity: Inactive  . Days of Exercise per Week: 0 days  . Minutes of Exercise per Session: 0 min  Stress: No Stress Concern Present  . Feeling of Stress : Not at all  Social Connections: Unknown  . Frequency of Communication with Friends and Family: More than three times a week  . Frequency of Social Gatherings with Friends and Family: Once a week  . Attends Religious Services: Patient refused  . Active Member of Clubs or Organizations: Patient  refused  . Attends Archivist Meetings: Patient refused  . Marital Status: Patient refused    Family History  Problem Relation Age of Onset  . Lung cancer Father   . Arthritis Other        Parents  .  Asthma Other        parent, other relative  . Breast cancer Other        other relative  . Hypertension Other        parent, other relative  . Heart disease Other        parent, other relative  . Heart disease Mother   . Asthma Mother   . Breast cancer Maternal Aunt 68  . Parkinson's disease Maternal Grandmother   . Rheumatic fever Maternal Grandfather     Review of Systems  Constitutional: Negative for fever.  Respiratory: Positive for cough (? related to lisinopril), shortness of breath (with moderate exertion) and wheezing (occ).   Cardiovascular: Negative for chest pain, palpitations and leg swelling.  Gastrointestinal: Positive for abdominal pain (sometimes), constipation and nausea.       Jerrye Bushy frequently  Musculoskeletal: Positive for arthralgias, myalgias and neck pain.  Neurological: Positive for headaches (sometimes - posterior - ? from neck).  Psychiatric/Behavioral: Positive for sleep disturbance.       Objective:   Vitals:   02/24/20 0908  BP: (!) 150/80  Pulse: 63  Resp: 16  Temp: 98.2 F (36.8 C)  SpO2: 98%   BP Readings from Last 3 Encounters:  02/24/20 (!) 150/80  02/24/20 (!) 150/80  11/05/19 (!) 180/100   Wt Readings from Last 3 Encounters:  02/24/20 149 lb (67.6 kg)  02/24/20 149 lb 3.2 oz (67.7 kg)  11/05/19 146 lb 8 oz (66.5 kg)   Body mass index is 28.15 kg/m.   Physical Exam    Constitutional: Appears well-developed and well-nourished. No distress.  HENT:  Head: Normocephalic and atraumatic.  Neck: Neck supple. No tracheal deviation present. No thyromegaly present.  No cervical lymphadenopathy Cardiovascular: Normal rate, regular rhythm and normal heart sounds.   No murmur heard. No carotid bruit .  No  edema Pulmonary/Chest: Effort normal and breath sounds normal. No respiratory distress. No has no wheezes. No rales. Abdomen: Soft, nondistended, nontender Skin: Skin is warm and dry. Not diaphoretic.  Psychiatric: Normal mood and affect. Behavior is normal.      Assessment & Plan:    See Problem List for Assessment and Plan of chronic medical problems.    This visit occurred during the SARS-CoV-2 public health emergency.  Safety protocols were in place, including screening questions prior to the visit, additional usage of staff PPE, and extensive cleaning of exam room while observing appropriate contact time as indicated for disinfecting solutions.

## 2020-02-23 NOTE — Patient Instructions (Addendum)
  Blood work was ordered.     Medications changes include :   none  Your prescription(s) have been submitted to your pharmacy. Please take as directed and contact our office if you believe you are having problem(s) with the medication(s).   A referral was ordered for kidney specialists.      Someone from their office will call you to schedule an appointment.    Please followup in 4 months

## 2020-02-24 ENCOUNTER — Ambulatory Visit (INDEPENDENT_AMBULATORY_CARE_PROVIDER_SITE_OTHER): Payer: Medicare Other | Admitting: Internal Medicine

## 2020-02-24 ENCOUNTER — Other Ambulatory Visit: Payer: Self-pay

## 2020-02-24 ENCOUNTER — Ambulatory Visit (INDEPENDENT_AMBULATORY_CARE_PROVIDER_SITE_OTHER): Payer: Medicare Other

## 2020-02-24 ENCOUNTER — Encounter: Payer: Self-pay | Admitting: Internal Medicine

## 2020-02-24 VITALS — BP 150/80 | HR 63 | Temp 98.2°F | Resp 16 | Ht 61.0 in | Wt 149.2 lb

## 2020-02-24 VITALS — BP 150/80 | HR 63 | Temp 98.2°F | Resp 16 | Ht 61.0 in | Wt 149.0 lb

## 2020-02-24 DIAGNOSIS — E118 Type 2 diabetes mellitus with unspecified complications: Secondary | ICD-10-CM | POA: Diagnosis not present

## 2020-02-24 DIAGNOSIS — M320 Drug-induced systemic lupus erythematosus: Secondary | ICD-10-CM | POA: Insufficient documentation

## 2020-02-24 DIAGNOSIS — N183 Chronic kidney disease, stage 3 unspecified: Secondary | ICD-10-CM | POA: Insufficient documentation

## 2020-02-24 DIAGNOSIS — E538 Deficiency of other specified B group vitamins: Secondary | ICD-10-CM | POA: Diagnosis not present

## 2020-02-24 DIAGNOSIS — N1831 Chronic kidney disease, stage 3a: Secondary | ICD-10-CM | POA: Insufficient documentation

## 2020-02-24 DIAGNOSIS — E785 Hyperlipidemia, unspecified: Secondary | ICD-10-CM

## 2020-02-24 DIAGNOSIS — K219 Gastro-esophageal reflux disease without esophagitis: Secondary | ICD-10-CM

## 2020-02-24 DIAGNOSIS — R600 Localized edema: Secondary | ICD-10-CM | POA: Diagnosis not present

## 2020-02-24 DIAGNOSIS — E041 Nontoxic single thyroid nodule: Secondary | ICD-10-CM

## 2020-02-24 DIAGNOSIS — E559 Vitamin D deficiency, unspecified: Secondary | ICD-10-CM

## 2020-02-24 DIAGNOSIS — M2550 Pain in unspecified joint: Secondary | ICD-10-CM

## 2020-02-24 DIAGNOSIS — I10 Essential (primary) hypertension: Secondary | ICD-10-CM | POA: Diagnosis not present

## 2020-02-24 DIAGNOSIS — Z0000 Encounter for general adult medical examination without abnormal findings: Secondary | ICD-10-CM | POA: Diagnosis not present

## 2020-02-24 LAB — CBC WITH DIFFERENTIAL/PLATELET
Basophils Absolute: 0.1 10*3/uL (ref 0.0–0.1)
Basophils Relative: 1.3 % (ref 0.0–3.0)
Eosinophils Absolute: 0.3 10*3/uL (ref 0.0–0.7)
Eosinophils Relative: 3.5 % (ref 0.0–5.0)
HCT: 39.1 % (ref 36.0–46.0)
Hemoglobin: 13 g/dL (ref 12.0–15.0)
Lymphocytes Relative: 23.2 % (ref 12.0–46.0)
Lymphs Abs: 1.7 10*3/uL (ref 0.7–4.0)
MCHC: 33.2 g/dL (ref 30.0–36.0)
MCV: 93.4 fl (ref 78.0–100.0)
Monocytes Absolute: 0.5 10*3/uL (ref 0.1–1.0)
Monocytes Relative: 6.9 % (ref 3.0–12.0)
Neutro Abs: 4.9 10*3/uL (ref 1.4–7.7)
Neutrophils Relative %: 65.1 % (ref 43.0–77.0)
Platelets: 304 10*3/uL (ref 150.0–400.0)
RBC: 4.19 Mil/uL (ref 3.87–5.11)
RDW: 14.4 % (ref 11.5–15.5)
WBC: 7.5 10*3/uL (ref 4.0–10.5)

## 2020-02-24 LAB — COMPREHENSIVE METABOLIC PANEL
ALT: 37 U/L — ABNORMAL HIGH (ref 0–35)
AST: 27 U/L (ref 0–37)
Albumin: 4.2 g/dL (ref 3.5–5.2)
Alkaline Phosphatase: 87 U/L (ref 39–117)
BUN: 16 mg/dL (ref 6–23)
CO2: 28 mEq/L (ref 19–32)
Calcium: 9.7 mg/dL (ref 8.4–10.5)
Chloride: 102 mEq/L (ref 96–112)
Creatinine, Ser: 1.04 mg/dL (ref 0.40–1.20)
GFR: 52.27 mL/min — ABNORMAL LOW (ref >60.00)
Glucose, Bld: 111 mg/dL — ABNORMAL HIGH (ref 70–99)
Potassium: 4.5 mEq/L (ref 3.5–5.1)
Sodium: 137 mEq/L (ref 135–145)
Total Bilirubin: 0.5 mg/dL (ref 0.2–1.2)
Total Protein: 7.5 g/dL (ref 6.0–8.3)

## 2020-02-24 LAB — VITAMIN D 25 HYDROXY (VIT D DEFICIENCY, FRACTURES): VITD: 50.62 ng/mL (ref 30.00–100.00)

## 2020-02-24 LAB — C-REACTIVE PROTEIN: CRP: <1 mg/dL (ref 0.5–20.0)

## 2020-02-24 LAB — LIPID PANEL
Cholesterol: 292 mg/dL — ABNORMAL HIGH (ref 0–200)
HDL: 56.4 mg/dL (ref >39.00)
NonHDL: 235.21
Total CHOL/HDL Ratio: 5
Triglycerides: 228 mg/dL — ABNORMAL HIGH (ref 0.0–149.0)
VLDL: 45.6 mg/dL — ABNORMAL HIGH (ref 0.0–40.0)

## 2020-02-24 LAB — TSH: TSH: 3 u[IU]/mL (ref 0.35–4.50)

## 2020-02-24 LAB — SEDIMENTATION RATE: Sed Rate: 52 mm/hr — ABNORMAL HIGH (ref 0–30)

## 2020-02-24 LAB — LDL CHOLESTEROL, DIRECT: Direct LDL: 193 mg/dL

## 2020-02-24 LAB — FOLATE: Folate: >23.6 ng/mL (ref >5.9)

## 2020-02-24 LAB — VITAMIN B12: Vitamin B-12: >1526 pg/mL — ABNORMAL HIGH (ref 211–911)

## 2020-02-24 LAB — HEMOGLOBIN A1C: Hgb A1c MFr Bld: 6.4 % (ref 4.6–6.5)

## 2020-02-24 LAB — MAGNESIUM: Magnesium: 1.8 mg/dL (ref 1.5–2.5)

## 2020-02-24 MED ORDER — ONDANSETRON 8 MG PO TBDP: 8.0000 mg | ORAL_TABLET | Freq: Three times a day (TID) | ORAL | 5 refills | Status: DC | PRN

## 2020-02-24 MED ORDER — APIXABAN 5 MG PO TABS: 5.0000 mg | ORAL_TABLET | Freq: Two times a day (BID) | ORAL | 5 refills | Status: DC

## 2020-02-24 MED ORDER — LISINOPRIL 10 MG PO TABS: 10.0000 mg | ORAL_TABLET | Freq: Every day | ORAL | 5 refills | Status: DC

## 2020-02-24 MED ORDER — FOLIC ACID 1 MG PO TABS: 1.0000 mg | ORAL_TABLET | Freq: Every day | ORAL | 5 refills | Status: DC

## 2020-02-24 MED ORDER — MAGNESIUM OXIDE 400 MG PO TABS: 1.0000 | ORAL_TABLET | Freq: Every day | ORAL | 5 refills | Status: DC

## 2020-02-24 NOTE — Assessment & Plan Note (Signed)
Chronic Taking B12 Check level

## 2020-02-24 NOTE — Assessment & Plan Note (Signed)
Chronic Taking vitamin D daily Check vitamin D level  

## 2020-02-24 NOTE — Assessment & Plan Note (Signed)
Chronic Diet controlled Has not been compliant with a diabetic diet, has gained weight and is not exercising Sugars likely not to be controlled-check A1c today Stressed regular exercise and improving her diet

## 2020-02-24 NOTE — Assessment & Plan Note (Signed)
Chronic Ultrasound done in the past-no additional imaging needed TSH today

## 2020-02-24 NOTE — Assessment & Plan Note (Addendum)
Chronic Had improved after hydralazine was discontinued-had drug-induced lupus Increased joint pain and muscle pain recently ?  Autoimmune related She thinks it did improve with stopping her statin, but she still has the symptoms-?  Related to the statin-currently not on statin Check ANA, ESR, CRP

## 2020-02-24 NOTE — Assessment & Plan Note (Signed)
Chronic Not ideally controlled Currently taking omeprazole 20 mg daily This increased again as she started to decrease her Creon Does see GI in the near future-May need to reconsider decreasing Creon, which has been decreased secondary to constipation

## 2020-02-24 NOTE — Assessment & Plan Note (Signed)
Chronic Controlled

## 2020-02-24 NOTE — Assessment & Plan Note (Signed)
History of drug-induced SLE secondary hydralazine Resolved Has been experiencing increased pain-muscles more than joints ANA, ESR, CRP Sees rheumatology again in a month or 2

## 2020-02-24 NOTE — Assessment & Plan Note (Addendum)
Chronic Kidney function decreased significantly 01/2019 during hospitalization when she had significant dehydration, but had improved into the normal range and over the past year has varied She is very concerned about her kidney function Blood pressure is controlled at home Check A1c to see where sugars are-may need to consider starting medication CMP, CBC today Will refer to nephrology

## 2020-02-24 NOTE — Assessment & Plan Note (Signed)
Chronic Blood pressure well controlled at home, but elevated here Will not change medication Continue lisinopril 10 mg daily and Bystolic 2.5 mg daily CMP

## 2020-02-24 NOTE — Assessment & Plan Note (Signed)
Chronic Taking magnesium We will check level

## 2020-02-24 NOTE — Patient Instructions (Signed)
[name] , Thank you for taking time to come for your Medicare Wellness Visit. I appreciate your ongoing commitment to your health goals. Please review the following plan we discussed and let me know if I can assist you in the future.   Screening recommendations/referrals: Colonoscopy: 08/22/2018 Mammogram: 08/31/2017 Bone Density: 03/09/2012 Recommended yearly ophthalmology/optometry visit for glaucoma screening and checkup Recommended yearly dental visit for hygiene and checkup  Vaccinations: Influenza vaccine: 11/05/2019 Pneumococcal vaccine: up to date Tdap vaccine: 06/03/2014 Shingles vaccine: declined   Covid-19: up to date  Advanced directives: Documents on file.  Conditions/risks identified: Yes; Reviewed health maintenance screenings with patient today and relevant education, vaccines, and/or referrals were provided. Please continue to do your personal lifestyle choices by: daily care of teeth and gums, regular physical activity (goal should be 5 days a week for 30 minutes), eat a healthy diet, avoid tobacco and drug use, limiting any alcohol intake, taking a low-dose aspirin (if not allergic or have been advised by your provider otherwise) and taking vitamins and minerals as recommended by your provider. Continue doing brain stimulating activities (puzzles, reading, adult coloring books, staying active) to keep memory sharp. Continue to eat heart healthy diet (full of fruits, vegetables, whole grains, lean protein, water--limit salt, fat, and sugar intake) and increase physical activity as tolerated.  Next appointment: Please schedule your next Medicare Wellness Visit with your Nurse Health Advisor in 1 year by calling [phone].  Preventive Care 37 Years and Older, Female Preventive care refers to lifestyle choices and visits with your health care provider that can promote health and wellness. What does preventive care include?  A yearly physical exam. This is also called an  annual well check.  Dental exams once or twice a year.  Routine eye exams. Ask your health care provider how often you should have your eyes checked.  Personal lifestyle choices, including:  Daily care of your teeth and gums.  Regular physical activity.  Eating a healthy diet.  Avoiding tobacco and drug use.  Limiting alcohol use.  Practicing safe sex.  Taking low-dose aspirin every day.  Taking vitamin and mineral supplements as recommended by your health care provider. What happens during an annual well check? The services and screenings done by your health care provider during your annual well check will depend on your age, overall health, lifestyle risk factors, and family history of disease. Counseling  Your health care provider may ask you questions about your:  Alcohol use.  Tobacco use.  Drug use.  Emotional well-being.  Home and relationship well-being.  Sexual activity.  Eating habits.  History of falls.  Memory and ability to understand (cognition).  Work and work Statistician.  Reproductive health. Screening  You may have the following tests or measurements:  Height, weight, and BMI.  Blood pressure.  Lipid and cholesterol levels. These may be checked every 5 years, or more frequently if you are over 54 years old.  Skin check.  Lung cancer screening. You may have this screening every year starting at age 29 if you have a 30-pack-year history of smoking and currently smoke or have quit within the past 15 years.  Fecal occult blood test (FOBT) of the stool. You may have this test every year starting at age 68.  Flexible sigmoidoscopy or colonoscopy. You may have a sigmoidoscopy every 5 years or a colonoscopy every 10 years starting at age 31.  Hepatitis C blood test.  Hepatitis B blood test.  Sexually transmitted disease (STD) testing.  Diabetes screening. This is done by checking your blood sugar (glucose) after you have not eaten for  a while (fasting). You may have this done every 1-3 years.  Bone density scan. This is done to screen for osteoporosis. You may have this done starting at age 98.  Mammogram. This may be done every 1-2 years. Talk to your health care provider about how often you should have regular mammograms. Talk with your health care provider about your test results, treatment options, and if necessary, the need for more tests. Vaccines  Your health care provider may recommend certain vaccines, such as:  Influenza vaccine. This is recommended every year.  Tetanus, diphtheria, and acellular pertussis (Tdap, Td) vaccine. You may need a Td booster every 10 years.  Zoster vaccine. You may need this after age 73.  Pneumococcal 13-valent conjugate (PCV13) vaccine. One dose is recommended after age 55.  Pneumococcal polysaccharide (PPSV23) vaccine. One dose is recommended after age 27. Talk to your health care provider about which screenings and vaccines you need and how often you need them. This information is not intended to replace advice given to you by your health care provider. Make sure you discuss any questions you have with your health care provider. Document Released: 02/27/2015 Document Revised: 10/21/2015 Document Reviewed: 12/02/2014 Elsevier Interactive Patient Education  2017 [place] Prevention in the Home Falls can cause injuries. They can happen to people of all ages. There are many things you can do to make your home safe and to help prevent falls. What can I do on the outside of my home?  Regularly fix the edges of walkways and driveways and fix any cracks.  Remove anything that might make you trip as you walk through a door, such as a raised step or threshold.  Trim any bushes or trees on the path to your home.  Use bright outdoor lighting.  Clear any walking paths of anything that might make someone trip, such as rocks or tools.  Regularly check to see if handrails  are loose or broken. Make sure that both sides of any steps have handrails.  Any raised decks and porches should have guardrails on the edges.  Have any leaves, snow, or ice cleared regularly.  Use sand or salt on walking paths during winter.  Clean up any spills in your garage right away. This includes oil or grease spills. What can I do in the bathroom?  Use night lights.  Install grab bars by the toilet and in the tub and shower. Do not use towel bars as grab bars.  Use non-skid mats or decals in the tub or shower.  If you need to sit down in the shower, use a plastic, non-slip stool.  Keep the floor dry. Clean up any water that spills on the floor as soon as it happens.  Remove soap buildup in the tub or shower regularly.  Attach bath mats securely with double-sided non-slip rug tape.  Do not have throw rugs and other things on the floor that can make you trip. What can I do in the bedroom?  Use night lights.  Make sure that you have a light by your bed that is easy to reach.  Do not use any sheets or blankets that are too big for your bed. They should not hang down onto the floor.  Have a firm chair that has side arms. You can use this for support while you get dressed.  Do not have throw  rugs and other things on the floor that can make you trip. What can I do in the kitchen?  Clean up any spills right away.  Avoid walking on wet floors.  Keep items that you use a lot in easy-to-reach places.  If you need to reach something above you, use a strong step stool that has a grab bar.  Keep electrical cords out of the way.  Do not use floor polish or wax that makes floors slippery. If you must use wax, use non-skid floor wax.  Do not have throw rugs and other things on the floor that can make you trip. What can I do with my stairs?  Do not leave any items on the stairs.  Make sure that there are handrails on both sides of the stairs and use them. Fix handrails  that are broken or loose. Make sure that handrails are as long as the stairways.  Check any carpeting to make sure that it is firmly attached to the stairs. Fix any carpet that is loose or worn.  Avoid having throw rugs at the top or bottom of the stairs. If you do have throw rugs, attach them to the floor with carpet tape.  Make sure that you have a light switch at the top of the stairs and the bottom of the stairs. If you do not have them, ask someone to add them for you. What else can I do to help prevent falls?  Wear shoes that:  Do not have high heels.  Have rubber bottoms.  Are comfortable and fit you well.  Are closed at the toe. Do not wear sandals.  If you use a stepladder:  Make sure that it is fully opened. Do not climb a closed stepladder.  Make sure that both sides of the stepladder are locked into place.  Ask someone to hold it for you, if possible.  Clearly mark and make sure that you can see:  Any grab bars or handrails.  First and last steps.  Where the edge of each step is.  Use tools that help you move around (mobility aids) if they are needed. These include:  Canes.  Walkers.  Scooters.  Crutches.  Turn on the lights when you go into a dark area. Replace any light bulbs as soon as they burn out.  Set up your furniture so you have a clear path. Avoid moving your furniture around.  If any of your floors are uneven, fix them.  If there are any pets around you, be aware of where they are.  Review your medicines with your doctor. Some medicines can make you feel dizzy. This can increase your chance of falling. Ask your doctor what other things that you can do to help prevent falls. This information is not intended to replace advice given to you by your health care provider. Make sure you discuss any questions you have with your health care provider. Document Released: 11/27/2008 Document Revised: 07/09/2015 Document Reviewed: 03/07/2014 Elsevier  Interactive Patient Education  2017 [employer].

## 2020-02-24 NOTE — Assessment & Plan Note (Signed)
Chronic Stopped the pitavastatin secondary to increased muscle/joint pain, but continued to have pain She will try restarting it once a week and increase slowly

## 2020-02-24 NOTE — Progress Notes (Signed)
Subjective:   [name] is a 75 y.o. female who presents for Medicare Annual (Subsequent) preventive examination.  Review of Systems    No ROS. Medicare Wellness Visit. Additional risk factors are reflected in social history. Cardiac Risk Factors include: advanced age (>20men, >6 women);diabetes mellitus;dyslipidemia;family history of premature cardiovascular disease;hypertension     Objective:    Today's Vitals   02/24/20 0840  BP: (!) 150/80  Pulse: 63  Resp: 16  Temp: 98.2 F (36.8 C)  SpO2: 98%  Weight: 149 lb 3.2 oz (67.7 kg)  Height: 5\' 1"  (1.549 m)  PainSc: 0-No pain   Body mass index is 28.19 kg/m.  Advanced Directives 02/24/2020 03/07/2019 01/30/2019 01/05/2019 08/21/2018 08/20/2018 08/01/2018  Does Patient Have a Medical Advance Directive? Yes Yes No No Yes Yes Yes  Type of Advance Directive - [place];Living will - - [place];Living will Healthcare Power of [place]  Does patient want to make changes to medical advance directive? No - Patient declined - - - - - -  Copy of [place] in Chart? - No - copy requested - - Yes - validated most recent copy scanned in chart (See row information) No - copy requested No - copy requested  Would patient like information on creating a medical advance directive? - - No - Patient declined No - Guardian declined No - Patient declined No - Patient declined -    Current Medications (verified) Outpatient Encounter Medications as of 02/24/2020  Medication Sig  . Ascorbic Acid (VITAMIN C GUMMIE PO) Take 1 tablet by mouth every evening.  . Cholecalciferol (VITAMIN D3 GUMMIES ADULT PO) Take 1 tablet by mouth every evening.  . clindamycin (CLEOCIN T) 1 % external solution Apply 1 application topically daily as needed (wounds).   . clobetasol (TEMOVATE) 0.05 % external solution Apply topically.  . Coenzyme Q10 (COQ10 GUMMIES ADULT PO) Take 1  tablet by mouth every evening.  Marland Kitchen CREON 12000 units CPEP capsule Take by mouth in the morning and at bedtime. 36,000 units twice daily  . ELIQUIS 5 MG TABS tablet TAKE 1 TABLET BY MOUTH TWICE A DAY  . folic acid (FOLVITE) 1 MG tablet TAKE 1 TABLET BY MOUTH EVERY DAY  . halobetasol (ULTRAVATE) 0.05 % cream Apply 1 application topically daily. After bathing  . lisinopril (ZESTRIL) 10 MG tablet Take 1 tablet (10 mg total) by mouth daily.  Marland Kitchen loratadine (CLARITIN) 10 MG tablet Take 10 mg by mouth daily as needed for allergies.  . magnesium oxide (MAG-OX) 400 MG tablet TAKE 1 TABLET BY MOUTH EVERY DAY  . Multiple Vitamins-Minerals (ADULT GUMMY PO) Take 1 tablet by mouth every evening.  . nebivolol (BYSTOLIC) 2.5 MG tablet TAKE 1 TABLET BY MOUTH EVERY DAY  . omeprazole (PRILOSEC OTC) 20 MG tablet Take 20 mg by mouth daily.  . ondansetron (ZOFRAN-ODT) 8 MG disintegrating tablet Take 8 mg by mouth every 8 (eight) hours as needed.  Marland Kitchen oxymetazoline (AFRIN) 0.05 % nasal spray Place 1 spray into both nostrils 2 (two) times daily.  . vitamin B-12 (CYANOCOBALAMIN) 1000 MCG tablet Take 1,000 mcg by mouth daily.  Marland Kitchen nystatin-triamcinolone (MYCOLOG II) cream Apply 1 application topically 2 (two) times daily. (Patient not taking: Reported on 02/24/2020)  . ONETOUCH ULTRA test strip USE TEST STRIPS TO CHECK BLOOD SUGAR AT LEAST 3 TIMES DAILY (Patient not taking: Reported on 02/24/2020)  . Pitavastatin Calcium 1 MG TABS Take 1  tablet (1 mg total) by mouth 3 (three) times a week. (Patient not taking: Reported on 02/24/2020)  . Polyethyl Glycol-Propyl Glycol (LUBRICANT EYE DROPS) 0.4-0.3 % SOLN Place 1 drop into both eyes daily as needed (dry/irritated eyes.). (Patient not taking: Reported on 02/24/2020)   No facility-administered encounter medications on file as of 02/24/2020.    Allergies (verified) Phenergan [promethazine hcl], Repatha [evolocumab], Amlodipine, Clarithromycin, Codeine, Erythromycin, Hydralazine hcl,  Statins, and Tetracycline   History: Past Medical History:  Diagnosis Date  . ALLERGIC RHINITIS   . ANEMIA-NOS   . Arthritis   . ASTHMA   . Carpal tunnel syndrome   . COLONIC POLYPS, HX OF   . Coronary artery disease    "mild CAD" noted on 12/05/17 in coronary CT scan  . DIABETES MELLITUS, TYPE II    diet controlled  . GERD   . Hand tingling   . HYPERLIPIDEMIA   . HYPERTENSION   . Neuroendocrine tumor   . OSTEOPENIA   . PONV (postoperative nausea and vomiting)    " it relaxes my bladder muscles and I have been known to pee all over the place"  . Psoriasis    severe, began soriatane 01/2012  . Pulmonary embolism ([place])   . Rectal fissure   . Scoliosis   . Wears glasses    Past Surgical History:  Procedure Laterality Date  . benign rectal growth  2004   removed by Dr. Zella Richer  . BIOPSY  08/09/2018   Procedure: BIOPSY;  Surgeon: Carol Ada, MD;  Location: [place];  Service: Endoscopy;;  . BIOPSY  08/22/2018   Procedure: BIOPSY;  Surgeon: Juanita Craver, MD;  Location: [place];  Service: Endoscopy;;  . BIOPSY  03/07/2019   Procedure: BIOPSY;  Surgeon: Irving Copas., MD;  Location: [place];  Service: Gastroenterology;;  . BREAST BIOPSY Right   . BREAST EXCISIONAL BIOPSY Left   . BREAST SURGERY  1988   biopsy  . CESAREAN SECTION    . COLONOSCOPY N/A 08/22/2018   Procedure: COLONOSCOPY;  Surgeon: Juanita Craver, MD;  Location: [facility] ENDOSCOPY;  Service: Endoscopy;  Laterality: N/A;  . ENDOSCOPIC MUCOSAL RESECTION N/A 03/07/2019   Procedure: ENDOSCOPIC MUCOSAL RESECTION;  Surgeon: Rush Landmark Telford Nab., MD;  Location: [place];  Service: Gastroenterology;  Laterality: N/A;  . ESOPHAGOGASTRODUODENOSCOPY (EGD) WITH PROPOFOL N/A 08/04/2018   Procedure: ESOPHAGOGASTRODUODENOSCOPY (EGD) WITH PROPOFOL;  Surgeon: Ladene Artist, MD;  Location: [place];  Service: Gastroenterology;  Laterality: N/A;  . ESOPHAGOGASTRODUODENOSCOPY (EGD) WITH PROPOFOL N/A  03/07/2019   Procedure: ESOPHAGOGASTRODUODENOSCOPY (EGD) WITH PROPOFOL;  Surgeon: Rush Landmark Telford Nab., MD;  Location: [place];  Service: Gastroenterology;  Laterality: N/A;  . EUS N/A 03/07/2019   Procedure: UPPER ENDOSCOPIC ULTRASOUND (EUS) RADIAL;  Surgeon: Rush Landmark Telford Nab., MD;  Location: [place];  Service: Gastroenterology;  Laterality: N/A;  . FINE NEEDLE ASPIRATION  03/07/2019   Procedure: FINE NEEDLE ASPIRATION (FNA) LINEAR;  Surgeon: Irving Copas., MD;  Location: [place];  Service: Gastroenterology;;  . Otho Darner SIGMOIDOSCOPY N/A 08/04/2018   Procedure: FLEXIBLE SIGMOIDOSCOPY;  Surgeon: Ladene Artist, MD;  Location: [place];  Service: Gastroenterology;  Laterality: N/A;  . FLEXIBLE SIGMOIDOSCOPY N/A 08/09/2018   Procedure: FLEXIBLE SIGMOIDOSCOPY;  Surgeon: Carol Ada, MD;  Location: [place];  Service: Endoscopy;  Laterality: N/A;  . HEMOSTASIS CLIP PLACEMENT  03/07/2019   Procedure: HEMOSTASIS CLIP PLACEMENT;  Surgeon: Irving Copas., MD;  Location: [place];  Service: Gastroenterology;;  . Lia Foyer LIFTING INJECTION  03/07/2019  Procedure: SUBMUCOSAL LIFTING INJECTION;  Surgeon: Irving Copas., MD;  Location: [place];  Service: Gastroenterology;;  . VIDEO BRONCHOSCOPY WITH ENDOBRONCHIAL NAVIGATION N/A 03/14/2018   Procedure: VIDEO BRONCHOSCOPY WITH ENDOBRONCHIAL NAVIGATION;  Surgeon: Collene Gobble, MD;  Location: [facility];  Service: Thoracic;  Laterality: N/A;   Family History  Problem Relation Age of Onset  . Lung cancer Father   . Arthritis Other        Parents  . Asthma Other        parent, other relative  . Breast cancer Other        other relative  . Hypertension Other        parent, other relative  . Heart disease Other        parent, other relative  . Heart disease Mother   . Asthma Mother   . Breast cancer Maternal Aunt 68  . Parkinson's disease Maternal Grandmother   . Rheumatic fever Maternal  Grandfather    Social History   Socioeconomic History  . Marital status: Single    Spouse name: Not on file  . Number of children: 1  . Years of education: 23  . Highest education level: Bachelor's degree (e.g., BA, AB, BS)  Occupational History  . Occupation: retired Careers adviser  Tobacco Use  . Smoking status: Former Smoker    Packs/day: 1.00    Years: 10.00    Pack years: 10.00    Quit date: 02/14/1974    Years since quitting: 46.0  . Smokeless tobacco: Never Used  Vaping Use  . Vaping Use: Never used  Substance and Sexual Activity  . Alcohol use: No  . Drug use: No  . Sexual activity: Not on file  Other Topics Concern  . Not on file  Social History Narrative   Lives alone in a one story home.  Has one child and one grandchild.  Retired Careers adviser.  Education: college.    Social Determinants of Health   Financial Resource Strain: Low Risk   . Difficulty of Paying Living Expenses: Not hard at all  Food Insecurity: No Food Insecurity  . Worried About Charity fundraiser in the Last Year: Never true  . Ran Out of Food in the Last Year: Never true  Transportation Needs: No Transportation Needs  . Lack of Transportation (Medical): No  . Lack of Transportation (Non-Medical): No  Physical Activity: Inactive  . Days of Exercise per Week: 0 days  . Minutes of Exercise per Session: 0 min  Stress: No Stress Concern Present  . Feeling of Stress : Not at all  Social Connections: Unknown  . Frequency of Communication with Friends and Family: More than three times a week  . Frequency of Social Gatherings with Friends and Family: Once a week  . Attends Religious Services: Patient refused  . Active Member of Clubs or Organizations: Patient refused  . Attends Archivist Meetings: Patient refused  . Marital Status: Patient refused    Tobacco Counseling Counseling given: Not Answered   Clinical  Intake:  Pre-visit preparation completed: Yes  Pain : No/denies pain Pain Score: 0-No pain     BMI - recorded: 28.19 Nutritional Status: BMI 25 -29 Overweight Nutritional Risks: None Diabetes: Yes CBG done?: No Did pt. bring in CBG monitor from home?: No  How often do you need to have someone help you when you read instructions, pamphlets, or other written materials from your  doctor or pharmacy?: 1 - Never What is the last grade level you completed in school?: College degree  Diabetic? yes  Interpreter Needed?: No  Information entered by :: Lisette Abu, LPN   Activities of Daily Living In your present state of health, do you have any difficulty performing the following activities: 02/24/2020  Hearing? N  Vision? N  Difficulty concentrating or making decisions? N  Walking or climbing stairs? N  Dressing or bathing? N  Doing errands, shopping? N  Preparing Food and eating ? N  Using the Toilet? N  In the past six months, have you accidently leaked urine? N  Do you have problems with loss of bowel control? N  Managing your Medications? N  Managing your Finances? N  Housekeeping or managing your Housekeeping? N  Some recent data might be hidden    Patient Care Team: [name], MD as PCP - General (Internal Medicine) Dorothy Spark, MD as PCP - Cardiology (Cardiology) Rolm Bookbinder, MD (Dermatology) Marica Otter, OD (Optometry) Juanita Craver, MD as Consulting Physician (Gastroenterology) Starling Manns, MD (Orthopedic Surgery)  Indicate any recent Medical Services you may have received from other than [facility] providers in the past year (date may be approximate).     Assessment:   This is a routine wellness examination for [place].  Hearing/Vision screen No exam data present  Dietary issues and exercise activities discussed: Current Exercise Habits: The patient does not participate in regular exercise at present, Exercise limited by: orthopedic  condition(s)  Goals    .  Patient Stated (pt-stated)      To increase my activity within my physical limitations.      Depression Screen PHQ 2/9 Scores 02/24/2020 01/02/2019 01/10/2018 10/31/2016 01/22/2016 06/03/2014 03/04/2013  PHQ - 2 Score 1 0 0 1 0 0 2  PHQ- 9 Score - - - - - - 5    Fall Risk Fall Risk  02/24/2020 01/02/2019 07/31/2018 04/30/2018 01/15/2018  Falls in the past year? 1 0 1 1 1   Number falls in past yr: 0 0 0 0 0  Injury with Fall? 0 0 1 1 1   Risk for fall due to : - - - - Other (Comment)  Follow up - - - - Falls evaluation completed;Education provided;Falls prevention discussed    FALL RISK PREVENTION PERTAINING TO THE HOME:  Any stairs in or around the home? No  If so, are there any without handrails? No  Home free of loose throw rugs in walkways, pet beds, electrical cords, etc? Yes  Adequate lighting in your home to reduce risk of falls? Yes   ASSISTIVE DEVICES UTILIZED TO PREVENT FALLS:  Life alert? No  Use of a cane, walker or w/c? No  Grab bars in the bathroom? No  Shower chair or bench in shower? Yes  Elevated toilet seat or a handicapped toilet? Yes   TIMED UP AND GO:  Was the test performed? No .  Length of time to ambulate 10 feet: 0 sec.   Gait steady and fast without use of assistive device  Cognitive Function: Normal cognitive status assessed by direct observation by this Nurse Health Advisor. No abnormalities found.         Immunizations Immunization History  Administered Date(s) Administered  . Fluad Quad(high Dose 65+) 10/23/2018, 11/05/2019  . Influenza Split 11/29/2010, 11/15/2011  . Influenza, High Dose Seasonal PF 12/28/2012, 10/28/2015, 10/31/2016, 10/30/2017  . Influenza,inj,Quad PF,6+ Mos 10/22/2013, 11/07/2014  . PFIZER SARS-COV-2 Vaccination 03/13/2019, 04/03/2019, 10/15/2019  .  PPD Test 08/27/2012, 11/06/2014, 01/18/2016  . Pneumococcal Conjugate-13 10/22/2013  . Pneumococcal Polysaccharide-23 02/14/2002, 12/23/2014  .  Td 02/15/2004, 06/03/2014    TDAP status: Up to date  Flu Vaccine status: Up to date  Pneumococcal vaccine status: Up to date  Covid-19 vaccine status: Completed vaccines  Qualifies for Shingles Vaccine? Yes   Zostavax completed No   Shingrix Completed?: No.    Education has been provided regarding the importance of this vaccine. Patient has been advised to call insurance company to determine out of pocket expense if they have not yet received this vaccine. Advised may also receive vaccine at local pharmacy or Health Dept. Verbalized acceptance and understanding.  Screening Tests Health Maintenance  Topic Date Due  . DEXA SCAN  03/09/2014  . OPHTHALMOLOGY EXAM  07/01/2017  . FOOT EXAM  05/02/2018  . COVID-19 Vaccine (4 - Booster for [place] series) 04/13/2020  . HEMOGLOBIN A1C  05/04/2020  . TETANUS/TDAP  06/02/2024  . INFLUENZA VACCINE  Completed  . Hepatitis C Screening  Completed  . PNA vac Low Risk Adult  Completed    Health Maintenance  Health Maintenance Due  Topic Date Due  . DEXA SCAN  03/09/2014  . OPHTHALMOLOGY EXAM  07/01/2017  . FOOT EXAM  05/02/2018    Colorectal cancer screening: Type of screening: Colonoscopy. Completed 08/22/2018. Repeat every 0 years  Mammogram status: Completed 08/31/2017. Repeat every year  Bone Density status: Completed 03/09/2012. Results reflect: Bone density results: OSTEOPENIA. Repeat every 3 years.  Lung Cancer Screening: (Low Dose CT Chest recommended if Age 59-80 years, 30 pack-year currently smoking OR have quit w/in 15years.) does not qualify.   Lung Cancer Screening Referral: no  Additional Screening:  Hepatitis C Screening: does qualify; Completed yes  Vision Screening: Recommended annual ophthalmology exams for early detection of glaucoma and other disorders of the eye. Is the patient up to date with their annual eye exam?  No  Who is the provider or what is the name of the office in which the patient attends annual eye  exams? Marica Otter, OD. If pt is not established with a provider, would they like to be referred to a provider to establish care? No .   Dental Screening: Recommended annual dental exams for proper oral hygiene  Community Resource Referral / Chronic Care Management: CRR required this visit?  No   CCM required this visit?  No      Plan:     I have personally reviewed and noted the following in the patient's chart:   . Medical and social history . Use of alcohol, tobacco or illicit drugs  . Current medications and supplements . Functional ability and status . Nutritional status . Physical activity . Advanced directives . List of other physicians . Hospitalizations, surgeries, and ER visits in previous 12 months . Vitals . Screenings to include cognitive, depression, and falls . Referrals and appointments  In addition, I have reviewed and discussed with patient certain preventive protocols, quality metrics, and best practice recommendations. A written personalized care plan for preventive services as well as general preventive health recommendations were provided to patient.     Sheral Flow, LPN   [id number]   Nurse Notes: n/a

## 2020-02-24 NOTE — Assessment & Plan Note (Addendum)
Had folic acid deficiency when she was acutely ill in 2020 Took supplementation for a while No longer taking Check level of folic acid

## 2020-02-26 LAB — ANTI-NUCLEAR AB-TITER (ANA TITER): ANA Titer 1: 1:80 {titer} — ABNORMAL HIGH

## 2020-02-26 LAB — ANA: Anti Nuclear Antibody (ANA): POSITIVE — AB

## 2020-03-09 ENCOUNTER — Other Ambulatory Visit (HOSPITAL_COMMUNITY): Payer: Self-pay

## 2020-04-02 ENCOUNTER — Other Ambulatory Visit: Payer: Self-pay

## 2020-04-02 ENCOUNTER — Ambulatory Visit (INDEPENDENT_AMBULATORY_CARE_PROVIDER_SITE_OTHER): Payer: Medicare Other | Admitting: Cardiology

## 2020-04-02 ENCOUNTER — Encounter: Payer: Self-pay | Admitting: Cardiology

## 2020-04-02 VITALS — BP 150/90 | HR 73 | Ht 61.0 in | Wt 152.8 lb

## 2020-04-02 DIAGNOSIS — I700 Atherosclerosis of aorta: Secondary | ICD-10-CM

## 2020-04-02 DIAGNOSIS — E785 Hyperlipidemia, unspecified: Secondary | ICD-10-CM | POA: Diagnosis not present

## 2020-04-02 DIAGNOSIS — I10 Essential (primary) hypertension: Secondary | ICD-10-CM | POA: Diagnosis not present

## 2020-04-02 NOTE — Progress Notes (Signed)
Cardiology Office Note:    Date:  04/02/2020   ID:  [name], DOB 08/14/1945, MRN [id number]  PCP:  [name], MD  Cardiologist:  Ena Dawley, MD   Referring MD: [name], MD   Chief complain: Shortness of breath, uncontrolled hypertension, hyperlipidemia  History of Present Illness:    [name] is a 75 y.o. female with a hx of difficult to control hypertension, hyperlipidemia, who is coming for concern of worsening shortness of breath on exertion, she has been having difficulties controlling her blood pressure, now managed well by Dr. Quay Burow, she was previously on amlodipine that caused lower extremity edema, her blood pressure could be as high as 200, also any stool low she gets dizzy and she was experiencing falls. She has recently underwent brain MRI that showed progressive microvascular disease. She underwent coronary CTA that showed mild nonobstructive CAD.  She was started on pitavastatin 1 mg, she previously did not tolerate pravastatin and atorvastatin.  She developed hip pain and back pain and discontinued pitavastatin with some improvement of symptoms, she is willing to retry again.  She was also found to have a lung mass in her left lower lobe, full chest CT showed multiple other masses however PET CT was negative for malignancy.   She has been hospitalized twice in 2020 and 2021 with intractable chronic diarrhea nausea and vomiting, she has been tested excessively including Salmonella, Shigella, Giardia, C. difficile with all the tests negative.  She continues to have mild diarrhea, she has lost over 10 pounds in 1 month.  She underwent colonoscopy without any bleeding, she discontinued her cholesterol medications, she was started on hydralazine.  There was a consideration for neuro endocrine tumor as a reason for her symptoms of diarrhea and weight loss however exploratory laparotomy did not identify any mass.  08/13/2019 - since the last visit  she has been doing well from cardiac standpoint. Lower lower extremity edema has resolved with Lasix in the past, no edema recently.  She denies any chest pain or shortness of breath.  No palpitation dizziness or syncope.  She complains of easy bruising and very dry frail skin that breaks easily, her Eliquis was changed to Xarelto however that has not changed her symptoms.  04/02/2020 -the patient is coming after 6 months, she has been dealing with significant amount of GI issues and hospitalization, she also had significant side effects with multiple statins in afterwards developed severe myalgias and muscle weakness with Repatha with inability to walk.  Dr. Quay Burow placed her back on Livalo that she just started 2 weeks ago so far without side effects.  In between when she stopped taking her statins and Repatha her triglycerides climbed to 228 and LDL to 193.  She denies any chest pain, she has gained weight and feels winded when she walks but she attributes it to the [place] and inability to exercise because of significant back pain.   Past Medical History:  Diagnosis Date  . ALLERGIC RHINITIS   . ANEMIA-NOS   . Arthritis   . ASTHMA   . Carpal tunnel syndrome   . COLONIC POLYPS, HX OF   . Coronary artery disease    "mild CAD" noted on 12/05/17 in coronary CT scan  . DIABETES MELLITUS, TYPE II    diet controlled  . GERD   . Hand tingling   . HYPERLIPIDEMIA   . HYPERTENSION   . Neuroendocrine tumor   . OSTEOPENIA   .  PONV (postoperative nausea and vomiting)    " it relaxes my bladder muscles and I have been known to pee all over the place"  . Psoriasis    severe, began soriatane 01/2012  . Pulmonary embolism ([place])   . Rectal fissure   . Scoliosis   . Wears glasses     Past Surgical History:  Procedure Laterality Date  . benign rectal growth  2004   removed by Dr. Zella Richer  . BIOPSY  08/09/2018   Procedure: BIOPSY;  Surgeon: Carol Ada, MD;  Location: [place];  Service:  Endoscopy;;  . BIOPSY  08/22/2018   Procedure: BIOPSY;  Surgeon: Juanita Craver, MD;  Location: [place];  Service: Endoscopy;;  . BIOPSY  03/07/2019   Procedure: BIOPSY;  Surgeon: Irving Copas., MD;  Location: [place];  Service: Gastroenterology;;  . BREAST BIOPSY Right   . BREAST EXCISIONAL BIOPSY Left   . BREAST SURGERY  1988   biopsy  . CESAREAN SECTION    . COLONOSCOPY N/A 08/22/2018   Procedure: COLONOSCOPY;  Surgeon: Juanita Craver, MD;  Location: [facility] ENDOSCOPY;  Service: Endoscopy;  Laterality: N/A;  . ENDOSCOPIC MUCOSAL RESECTION N/A 03/07/2019   Procedure: ENDOSCOPIC MUCOSAL RESECTION;  Surgeon: Rush Landmark Telford Nab., MD;  Location: [place];  Service: Gastroenterology;  Laterality: N/A;  . ESOPHAGOGASTRODUODENOSCOPY (EGD) WITH PROPOFOL N/A 08/04/2018   Procedure: ESOPHAGOGASTRODUODENOSCOPY (EGD) WITH PROPOFOL;  Surgeon: Ladene Artist, MD;  Location: [place];  Service: Gastroenterology;  Laterality: N/A;  . ESOPHAGOGASTRODUODENOSCOPY (EGD) WITH PROPOFOL N/A 03/07/2019   Procedure: ESOPHAGOGASTRODUODENOSCOPY (EGD) WITH PROPOFOL;  Surgeon: Rush Landmark Telford Nab., MD;  Location: [place];  Service: Gastroenterology;  Laterality: N/A;  . EUS N/A 03/07/2019   Procedure: UPPER ENDOSCOPIC ULTRASOUND (EUS) RADIAL;  Surgeon: Rush Landmark Telford Nab., MD;  Location: [place];  Service: Gastroenterology;  Laterality: N/A;  . FINE NEEDLE ASPIRATION  03/07/2019   Procedure: FINE NEEDLE ASPIRATION (FNA) LINEAR;  Surgeon: Irving Copas., MD;  Location: [place];  Service: Gastroenterology;;  . Otho Darner SIGMOIDOSCOPY N/A 08/04/2018   Procedure: FLEXIBLE SIGMOIDOSCOPY;  Surgeon: Ladene Artist, MD;  Location: [place];  Service: Gastroenterology;  Laterality: N/A;  . FLEXIBLE SIGMOIDOSCOPY N/A 08/09/2018   Procedure: FLEXIBLE SIGMOIDOSCOPY;  Surgeon: Carol Ada, MD;  Location: [place];  Service: Endoscopy;  Laterality: N/A;  . HEMOSTASIS CLIP  PLACEMENT  03/07/2019   Procedure: HEMOSTASIS CLIP PLACEMENT;  Surgeon: Irving Copas., MD;  Location: [place];  Service: Gastroenterology;;  . Lia Foyer LIFTING INJECTION  03/07/2019   Procedure: SUBMUCOSAL LIFTING INJECTION;  Surgeon: Irving Copas., MD;  Location: [place];  Service: Gastroenterology;;  . VIDEO BRONCHOSCOPY WITH ENDOBRONCHIAL NAVIGATION N/A 03/14/2018   Procedure: VIDEO BRONCHOSCOPY WITH ENDOBRONCHIAL NAVIGATION;  Surgeon: Collene Gobble, MD;  [facility];  Service: Thoracic;  Laterality: N/A;   Current Medications: Current Meds  Medication Sig  . apixaban (ELIQUIS) 5 MG TABS tablet Take 1 tablet (5 mg total) by mouth 2 (two) times daily.  . Ascorbic Acid (VITAMIN C GUMMIE PO) Take 1 tablet by mouth every evening.  . Cholecalciferol (VITAMIN D3 GUMMIES ADULT PO) Take 1 tablet by mouth every evening.  . clindamycin (CLEOCIN T) 1 % external solution Apply 1 application topically daily as needed (wounds).   . clobetasol (TEMOVATE) 0.05 % external solution Apply topically.  . Coenzyme Q10 (COQ10 GUMMIES ADULT PO) Take 1 tablet by mouth every evening.  Marland Kitchen CREON 36000-114000 units CPEP capsule Take by mouth 3 (three) times daily before meals.  Marland Kitchen  folic acid (FOLVITE) 1 MG tablet Take 1 tablet (1 mg total) by mouth daily.  . halobetasol (ULTRAVATE) 0.05 % cream Apply 1 application topically daily. After bathing  . lisinopril (ZESTRIL) 10 MG tablet Take 1 tablet (10 mg total) by mouth daily.  Marland Kitchen loratadine (CLARITIN) 10 MG tablet Take 10 mg by mouth daily as needed for allergies.  . magnesium oxide (MAG-OX) 400 MG tablet Take 1 tablet (400 mg total) by mouth daily.  . Multiple Vitamins-Minerals (ADULT GUMMY PO) Take 1 tablet by mouth every evening.  . nebivolol (BYSTOLIC) 2.5 MG tablet TAKE 1 TABLET BY MOUTH EVERY DAY  . nystatin-triamcinolone (MYCOLOG II) cream Apply 1 application topically 2 (two) times daily.  Marland Kitchen omeprazole (PRILOSEC OTC) 20 MG tablet  Take 20 mg by mouth daily.  . ondansetron (ZOFRAN-ODT) 8 MG disintegrating tablet Take 1 tablet (8 mg total) by mouth every 8 (eight) hours as needed.  [name] test strip USE TEST STRIPS TO CHECK BLOOD SUGAR AT LEAST 3 TIMES DAILY  . oxymetazoline (AFRIN) 0.05 % nasal spray Place 1 spray into both nostrils 2 (two) times daily.  . Pitavastatin Calcium 1 MG TABS Take by mouth 2 (two) times a week.  Vladimir Faster Glycol-Propyl Glycol (LUBRICANT EYE DROPS) 0.4-0.3 % SOLN Place 1 drop into both eyes daily as needed (dry/irritated eyes.).  Marland Kitchen vitamin B-12 (CYANOCOBALAMIN) 1000 MCG tablet Take 1,000 mcg by mouth daily.    Allergies:   Phenergan [promethazine hcl], Repatha [evolocumab], Amlodipine, Clarithromycin, Codeine, Erythromycin, Hydralazine hcl, Statins, and Tetracycline   Social History   Socioeconomic History  . Marital status: Single    Spouse name: Not on file  . Number of children: 1  . Years of education: 20  . Highest education level: Bachelor's degree (e.g., BA, AB, BS)  Occupational History  . Occupation: retired Careers adviser  Tobacco Use  . Smoking status: Former Smoker    Packs/day: 1.00    Years: 10.00    Pack years: 10.00    Quit date: 02/14/1974    Years since quitting: 46.1  . Smokeless tobacco: Never Used  Vaping Use  . Vaping Use: Never used  Substance and Sexual Activity  . Alcohol use: No  . Drug use: No  . Sexual activity: Not on file  Other Topics Concern  . Not on file  Social History Narrative   Lives alone in a one story home.  Has one child and one grandchild.  Retired Careers adviser.  Education: college.    Social Determinants of Health   Financial Resource Strain: Low Risk   . Difficulty of Paying Living Expenses: Not hard at all  Food Insecurity: No Food Insecurity  . Worried About Charity fundraiser in the Last Year: Never true  . Ran Out of Food in the Last Year: Never true   Transportation Needs: No Transportation Needs  . Lack of Transportation (Medical): No  . Lack of Transportation (Non-Medical): No  Physical Activity: Inactive  . Days of Exercise per Week: 0 days  . Minutes of Exercise per Session: 0 min  Stress: No Stress Concern Present  . Feeling of Stress : Not at all  Social Connections: Unknown  . Frequency of Communication with Friends and Family: More than three times a week  . Frequency of Social Gatherings with Friends and Family: Once a week  . Attends Religious Services: Patient refused  . Active Member of Clubs or Organizations:  Patient refused  . Attends Archivist Meetings: Patient refused  . Marital Status: Patient refused     Family History: The patient's family history includes Arthritis in an other family member; Asthma in her mother and another family member; Breast cancer in an other family member; Breast cancer (age of onset: 53) in her maternal aunt; Heart disease in her mother and another family member; Hypertension in an other family member; Lung cancer in her father; Parkinson's disease in her maternal grandmother; Rheumatic fever in her maternal grandfather.  ROS:   Please see the history of present illness.    All other systems reviewed and are negative.  EKGs/Labs/Other Studies Reviewed:    The following studies were reviewed today:  EKG:  EKG is not ordered today.   Recent Labs: 02/24/2020: ALT 37; BUN 16; Creatinine, Ser 1.04; Hemoglobin 13.0; Magnesium 1.8; Platelets 304.0; Potassium 4.5; Sodium 137; TSH 3.00  Recent Lipid Panel    Component Value Date/Time   CHOL 292 (H) 02/24/2020 1001   CHOL 168 10/14/2019 0805   TRIG 228.0 (H) 02/24/2020 1001   HDL 56.40 02/24/2020 1001   HDL 63 10/14/2019 0805   CHOLHDL 5 02/24/2020 1001   VLDL 45.6 (H) 02/24/2020 1001   LDLCALC 176 (H) 11/05/2019 0844   LDLDIRECT 193.0 02/24/2020 1001    Physical Exam:    VS:  BP (!) 150/90   Pulse 73   Ht _0  (1.549  m)   Wt 152 lb 12.8 oz (69.3 kg)   SpO2 97%   BMI 28.87 kg/m     Wt Readings from Last 3 Encounters:  04/02/20 152 lb 12.8 oz (69.3 kg)  02/24/20 149 lb (67.6 kg)  02/24/20 149 lb 3.2 oz (67.7 kg)    GEN: Well nourished, well developed in no acute distress HEENT: Normal NECK: No JVD; No carotid bruits LYMPHATICS: No lymphadenopathy CARDIAC: RRR, no murmurs, rubs, gallops RESPIRATORY:  Clear to auscultation without rales, wheezing or rhonchi  ABDOMEN: Soft, non-tender, non-distended MUSCULOSKELETAL: Minimal bilateral edema around her ankles, significant erythema in bilateral toes but good pulses.   SKIN: Warm and dry NEUROLOGIC:  Alert and oriented x 3 PSYCHIATRIC:  Normal affect   CT chest: 12/22/2017  1. In addition to the previously noted mass in the left lower lobe there several other smaller irregular-shaped nodules scattered throughout the lungs bilaterally. The irregular shape of the nodules would be unusual for metastatic disease. The possibility of a benign etiology such as multifocal bronchoceles should be considered, but is a diagnosis of exclusion. Malignancy should be excluded, and further evaluation with PET-CT is again suggested. 2. Nonocclusive filling defect in the distal left main pulmonary artery compatible with pulmonary embolism. 3. Hepatic steatosis. 4. Aortic atherosclerosis, in addition to left main and left anterior descending coronary artery disease. Assessment for potential risk factor modification, dietary therapy or pharmacologic therapy may be warranted, if clinically indicated. 5. There are calcifications of the aortic valve. Echocardiographic correlation for evaluation of potential valvular dysfunction may be warranted if clinically indicated.  Coronary CTA: 12/05/2017  1. Coronary calcium score of 6. This was 43 percentile for age and sex matched control. 2. Normal coronary origin with right dominance. 3. 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  PET CT: 01/01/2018  1. For the most part the branching nodularity in the lungs is of low activity and likely benign. Given the somewhat branching  tubular appearance, the possibility of multifocal bronchocele is certainly raised. The lesion along the left upper lobe paramediastinal area, and in the right upper lobe just adjacent to the fissure do have some low-grade activity (1.6 and 1.7, respectively) which may well be inflammatory but for which surveillance imaging in 6 months time would be suggested. 2. The larger lesion in the left lower lobe is not hypermetabolic and is likely benign. Bilateral tonsillar activity in the neck is likely benign. 3. No worrisome findings in the neck, abdomen/pelvis, or skeleton.   ASSESSMENT:    1. Aortic atherosclerosis ([place])   2. Primary hypertension   3. Hyperlipidemia, unspecified hyperlipidemia type    PLAN:    In order of problems listed above:  1. Pulmonary embolism -on Xarelto, we will continue, this might be lifelong given that it was unprovoked, possible LE injury with a fall shortly prior that.  She wants to be referred to hematology to see if she needs to continue taking anticoagulation lifelong.  2. 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  Followed by pulmonary.  She has not seen them in a year because of Covid, she will call them to make a new appointment.  3.  Mild nonobstructive CAD -and aortic atherosclerosis as well as microvascular disease on her brain imaging, she is currently taking Livalo, if she cannot tolerate it we will refer her to the lipid clinic for further management other options would be Nexlizet it and Vascepa.  Her EKG today is normal and unchanged from prior.  4.  Hypertension -controlled, she brings diary from home and her blood pressure is in the  range of 90 to 120 mmHg..  5.  Hyperlipidemia - as above.  6.  Lower extremity edema, sec to inactivity, resolved with short course of Lasix 20 mg daily.  Medication Adjustments/Labs and Tests Ordered: Current medicines are reviewed at length with the patient today.  Concerns regarding medicines are outlined above.  Orders Placed This Encounter  Procedures  . AMB Referral to [facility]  . EKG 12-Lead   No orders of the defined types were placed in this encounter.   Patient Instructions  Medication Instructions:  NO CHANGES *If you need a refill on your cardiac medications before your next appointment, please call your pharmacy*   Lab Work: NONE If you have labs (blood work) drawn today and your tests are completely normal, you will receive your results only by: Marland Kitchen MyChart Message (if you have MyChart) OR . A paper copy in the mail If you have any lab test that is abnormal or we need to change your treatment, we will call you to review the results.   Testing/Procedures: NONE   Follow-Up: At [facility], you and your health needs are our priority.  As part of our continuing mission to provide you with exceptional heart care, we have created designated Provider Care Teams.  These Care Teams include your primary Cardiologist (physician) and Advanced Practice Providers (APPs -  Physician Assistants and Nurse Practitioners) who all work together to provide you with the care you need, when you need it.  We recommend signing up for the patient portal called "MyChart".  Sign up information is provided on this After Visit Summary.  MyChart is used to connect with patients for Virtual Visits (Telemedicine).  Patients are able to view lab/test results, encounter notes, upcoming appointments, etc.  Non-urgent messages can be sent to your provider as  well.   To learn more about what you can do with MyChart, go to [email or web address].    Your next appointment:    6 month(s)  The format for your next appointment:   In Person  Provider:   Gwyndolyn Kaufman, MD   Other Instructions REFERRAL TO LIPID CLINIC     Signed, Ena Dawley, MD  04/02/2020 5:09 PM    [place]

## 2020-04-02 NOTE — Patient Instructions (Signed)
Medication Instructions:  NO CHANGES *If you need a refill on your cardiac medications before your next appointment, please call your pharmacy*   Lab Work: NONE If you have labs (blood work) drawn today and your tests are completely normal, you will receive your results only by: Marland Kitchen MyChart Message (if you have MyChart) OR . A paper copy in the mail If you have any lab test that is abnormal or we need to change your treatment, we will call you to review the results.   Testing/Procedures: NONE   Follow-Up: At [facility], you and your health needs are our priority.  As part of our continuing mission to provide you with exceptional heart care, we have created designated Provider Care Teams.  These Care Teams include your primary Cardiologist (physician) and Advanced Practice Providers (APPs -  Physician Assistants and Nurse Practitioners) who all work together to provide you with the care you need, when you need it.  We recommend signing up for the patient portal called "MyChart".  Sign up information is provided on this After Visit Summary.  MyChart is used to connect with patients for Virtual Visits (Telemedicine).  Patients are able to view lab/test results, encounter notes, upcoming appointments, etc.  Non-urgent messages can be sent to your provider as well.   To learn more about what you can do with MyChart, go to [email or web address].    Your next appointment:   6 month(s)  The format for your next appointment:   In Person  Provider:   Gwyndolyn Kaufman, MD   Other Instructions REFERRAL TO LIPID CLINIC

## 2020-04-16 ENCOUNTER — Other Ambulatory Visit: Payer: Self-pay

## 2020-04-16 ENCOUNTER — Telehealth: Payer: Self-pay | Admitting: *Deleted

## 2020-04-16 ENCOUNTER — Encounter: Payer: Self-pay | Admitting: Pharmacist

## 2020-04-16 ENCOUNTER — Ambulatory Visit (INDEPENDENT_AMBULATORY_CARE_PROVIDER_SITE_OTHER): Payer: Medicare Other | Admitting: Pharmacist

## 2020-04-16 DIAGNOSIS — E785 Hyperlipidemia, unspecified: Secondary | ICD-10-CM | POA: Diagnosis not present

## 2020-04-16 DIAGNOSIS — R1032 Left lower quadrant pain: Secondary | ICD-10-CM | POA: Insufficient documentation

## 2020-04-16 DIAGNOSIS — K760 Fatty (change of) liver, not elsewhere classified: Secondary | ICD-10-CM | POA: Insufficient documentation

## 2020-04-16 DIAGNOSIS — E782 Mixed hyperlipidemia: Secondary | ICD-10-CM

## 2020-04-16 MED ORDER — ROSUVASTATIN CALCIUM 5 MG PO TABS: 5.0000 mg | ORAL_TABLET | Freq: Every day | ORAL | 0 refills | Status: DC

## 2020-04-16 MED ORDER — CRESTOR 5 MG PO TABS: 5.0000 mg | ORAL_TABLET | Freq: Every day | ORAL | 0 refills | Status: DC

## 2020-04-16 NOTE — Patient Instructions (Addendum)
It was nice meeting you today!  I will reach out to Dr. [name] for you to ask about the Vascepa  We will change your Livalo to rosuvastatin 5mg  which you can take once or twice a week  Diet changes:  Try to avoid sweets and processed foods!  Concentrate on vegetables, fruits, whole grains, and lean proteins.  Plant based proteins: Nuts, lentils, tofu  Please call with any questions!   Karren Cobble, PharmD, BCACP, [place], [place] [address] [place], [place] [address] Phone: [phone]; Fax: [phone] 04/16/2020 10:26 AM

## 2020-04-16 NOTE — Progress Notes (Signed)
Patient ID: [name]                 DOB: 1945/08/13                    MRN: [id number]     HPI: [name] is a 75 y.o. female patient referred to lipid clinic by Dr [name]. PMH is significant for PE, HTN, CAD, CKD, atherosclerosis, and T2DM.  Patient also has history of GI issues which have yet to be diagnosed.  Many medications and foods cause diarrhea. Patient very hesistant to start any new medications due to risks of adverse effects.  Patient presents today in good spirits. Reports that in last months she has fallen off of her diet and has been eating many sweets, candies, and processed foods.  She says the sugar has become a "drug" for her.  Of note, patient has not been checking her blood sugar.  Has not been physcially active either.  Reports that in the past when her cholesterol was high, she was able to fix it with diet and exercise.  Statins cause severe muscle pain.  Currently is prescribed pitavastatin 1 mg twice a week but she can not tolerate it.  Has not taken it in about 10 days and is finally feeling some relief.  Reports that Repatha caused "paralysis."  Does not want to take fish oil because she fears she will have a hemorrhagic stroke when it is combined with her Eliquis. 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  Situation is further complicated by rheumatologic issues and gastrointestinal problems that have not been diagnosed.  She is also fearful of medications that are metabolized by her kidneys.    Current Medications: pitavastatin 1 mg twice a week (causing muscle pain) Intolerances: Repatha 140mg , lovastatain 20mg , pravastatin 20mg , Zetia 10mg  Risk Factors: CAD, HTN, HLD, DM LDL goal: <70  Labs: Direct LDL 193, TC 292, Trigs 228, HDL 56.4, VLDL 45.6 (02/24/20)  Past Medical History:  Diagnosis Date  . ALLERGIC RHINITIS   . ANEMIA-NOS   . Arthritis   . ASTHMA   . Carpal tunnel syndrome   . COLONIC POLYPS, HX OF   . Coronary  artery disease    "mild CAD" noted on 12/05/17 in coronary CT scan  . DIABETES MELLITUS, TYPE II    diet controlled  . GERD   . Hand tingling   . HYPERLIPIDEMIA   . HYPERTENSION   . Neuroendocrine tumor   . OSTEOPENIA   . PONV (postoperative nausea and vomiting)    " it relaxes my bladder muscles and I have been known to pee all over the place"  . Psoriasis    severe, began soriatane 01/2012  . Pulmonary embolism ([place])   . Rectal fissure   . Scoliosis   . Wears glasses     Current Outpatient Medications on File Prior to Visit  Medication Sig Dispense Refill  . apixaban (ELIQUIS) 5 MG TABS tablet Take 1 tablet (5 mg total) by mouth 2 (two) times daily. 60 tablet 5  . Ascorbic Acid (VITAMIN C GUMMIE PO) Take 1 tablet by mouth every evening.    . Cholecalciferol (VITAMIN D3 GUMMIES ADULT PO) Take 1 tablet by mouth every evening.    . clindamycin (CLEOCIN T) 1 % external solution Apply 1 application topically daily as needed (wounds).     . clobetasol (TEMOVATE) 0.05 % external solution Apply topically.    . Coenzyme Q10 (COQ10 GUMMIES  ADULT PO) Take 1 tablet by mouth every evening.    Marland Kitchen CREON 36000-114000 units CPEP capsule Take by mouth 3 (three) times daily before meals.    . folic acid (FOLVITE) 1 MG tablet Take 1 tablet (1 mg total) by mouth daily. 30 tablet 5  . halobetasol (ULTRAVATE) 0.05 % cream Apply 1 application topically daily. After bathing    . lisinopril (ZESTRIL) 10 MG tablet Take 1 tablet (10 mg total) by mouth daily. 30 tablet 5  . loratadine (CLARITIN) 10 MG tablet Take 10 mg by mouth daily as needed for allergies.    . magnesium oxide (MAG-OX) 400 MG tablet Take 1 tablet (400 mg total) by mouth daily. 30 tablet 5  . Multiple Vitamins-Minerals (ADULT GUMMY PO) Take 1 tablet by mouth every evening.    . nebivolol (BYSTOLIC) 2.5 MG tablet TAKE 1 TABLET BY MOUTH EVERY DAY 30 tablet 5  . nystatin-triamcinolone (MYCOLOG II) cream Apply 1 application topically 2 (two)  times daily. 30 g 0  . omeprazole (PRILOSEC OTC) 20 MG tablet Take 20 mg by mouth daily.    . ondansetron (ZOFRAN-ODT) 8 MG disintegrating tablet Take 1 tablet (8 mg total) by mouth every 8 (eight) hours as needed. 30 tablet 5  . ONETOUCH ULTRA test strip USE TEST STRIPS TO CHECK BLOOD SUGAR AT LEAST 3 TIMES DAILY 300 strip 1  . oxymetazoline (AFRIN) 0.05 % nasal spray Place 1 spray into both nostrils 2 (two) times daily.    . Pitavastatin Calcium 1 MG TABS Take by mouth 2 (two) times a week.    Vladimir Faster Glycol-Propyl Glycol (LUBRICANT EYE DROPS) 0.4-0.3 % SOLN Place 1 drop into both eyes daily as needed (dry/irritated eyes.).    Marland Kitchen vitamin B-12 (CYANOCOBALAMIN) 1000 MCG tablet Take 1,000 mcg by mouth daily.     No current facility-administered medications on file prior to visit.    Allergies  Allergen Reactions  . Phenergan [Promethazine Hcl] Shortness Of Breath and Anxiety  . Repatha [Evolocumab]     Muscle pain and weakness  . Amlodipine Swelling  . Clarithromycin Nausea And Vomiting    May have episodes of syncope due to prolonged vomiting   . Codeine Nausea And Vomiting    May have episodes of syncope due to prolonged vomiting  . Erythromycin Nausea And Vomiting  . Hydralazine Hcl     Drug-induced lupus - pt wishes to avoid  . Statins     Muscle pain   . Tetracycline Nausea And Vomiting    Assessment/Plan:  1. Hyperlipidemia - Patient LDL 193 which is significantly above goal of <70.  Unlikely patient will be able to reach goal without pharmacologic, likely multiple pharmacologic therapies.  Triglycerides 228 which is above goal of 150, however patient is resistant to Vascepa because she is worried about an increased risk of bleeding with Eliquis.  Advised patient that risk was small but she requested I message Dr [name] and her nurse to hear their input.  Discussed various LDL lowering therapies with patient including Praluent, Nexletol, Nexlizet, and Leqvio.  Patient did  not want to try Praluent due to past history with Repatha.  Did not want to try Nexletol because she believes it works too similarly to a statin.  Did not want to try Nexlizet due to the Zetia component.  Did not want to try Leqvio because it is too new and therer is not enough evidence behind it yet.  Patient prefers to try to lower LDL through  diet and exercise.  Encouraged her to make lifestyle changes however it is likely not going to get her to LDL goal.  Since patient is currently on pitavastatin once a week or less, is agreeable to changing to low dose rosuvastatin once a week or less for greater LDL lowering effect.  Will recheck lipids in 3 months.,  Stop pitavastatin 1 mg Start rosuvastatin 5 mg Recheck lipid panel in 3 months  Karren Cobble, PharmD, BCACP, [place], [place] [address] [place],  [address] Phone: [phone]; Fax: [phone] 04/16/2020 5:32 PM

## 2020-04-16 NOTE — Telephone Encounter (Signed)
-----   Message from Rollen Sox, [facility] sent at 04/16/2020  5:34 PM EST ----- Regarding: vascepa Dr. [name],   Encouraged this patient to begin Vascepa for her triglycerides but she is fearful of risk of bleeding with Eliquis. Emphasized that the risk was small, but she preferred that I run it by you first to see what you thought.  Thank you

## 2020-04-21 MED ORDER — ICOSAPENT ETHYL 1 G PO CAPS: 2.0000 g | ORAL_CAPSULE | Freq: Two times a day (BID) | ORAL | 5 refills | Status: DC

## 2020-04-21 NOTE — Addendum Note (Signed)
Addended by: Rollen Sox on: 04/21/2020 12:43 PM   Modules accepted: Orders

## 2020-04-21 NOTE — Telephone Encounter (Signed)
I agree that it is a low risk and worth trying.

## 2020-04-21 NOTE — Telephone Encounter (Signed)
Spoke with the [name] and informed her that per Dr. [name], she agrees with the Pharmacist that she should start taking Vascepa, for it is low risk and worth trying.  [name] verbalized understanding and agrees with this plan. [name] asked if Gerald Stabs PharmD sent the med in for her.  Informed her that I will inquire this from him. [name] agreed to plan.

## 2020-04-22 ENCOUNTER — Other Ambulatory Visit: Payer: Self-pay | Admitting: Internal Medicine

## 2020-05-10 ENCOUNTER — Other Ambulatory Visit: Payer: Self-pay | Admitting: Cardiology

## 2020-05-11 ENCOUNTER — Ambulatory Visit
Admission: RE | Admit: 2020-05-11 | Discharge: 2020-05-11 | Disposition: A | Discharge status: Home / Self Care | Payer: Medicare Other | Source: Ambulatory Visit | Attending: Internal Medicine | Admitting: Internal Medicine

## 2020-05-11 ENCOUNTER — Other Ambulatory Visit: Payer: Self-pay

## 2020-05-11 DIAGNOSIS — Q438 Other specified congenital malformations of intestine: Secondary | ICD-10-CM

## 2020-05-11 MED ORDER — IOPAMIDOL (ISOVUE-300) INJECTION 61%
100.0000 mL | Freq: Once | INTRAVENOUS | Status: AC | PRN
  Administered 2020-05-11: 100 mL via INTRAVENOUS

## 2020-06-10 ENCOUNTER — Telehealth: Payer: Self-pay

## 2020-06-10 NOTE — Telephone Encounter (Signed)
Mansouraty, Telford Nab., MD  Milus Banister, MD; Timothy Lasso, RN DJ thanks for the update.  Melinna Linarez, I just reviewed the prior EUS. Suspect nothing is going to be found since I could not find anything last time. I can certainly repeat aspiration of any lymph nodes if they are still present but the CT enterography does not suggest there is anything either. Okay to proceed as a direct EUS with possible FNA/FNB/EMR. 75-minute case. Please let me know when my next available slot is within the next month. Thanks.   GM        Previous Messages   ----- Message -----  From: Milus Banister, MD  Sent: 06/10/2020 12:20 PM EDT  To: Timothy Lasso, RN, Irving Copas., MD   Chester Holstein,  You actually performed endoscopic ultrasound for her a little over a year ago and so I will defer to you on this 1.    ----- Message -----  From: Timothy Lasso, RN  Sent: 06/10/2020 11:30 AM EDT  To: Milus Banister, MD, *    ----- Message -----  From: Jabier Gauss  Sent: 06/10/2020 11:20 AM EDT  To: Timothy Lasso, RN   Good morning Precious Bard,  We received an urgent referral for patient to have an EUS with FNA.

## 2020-06-11 NOTE — Telephone Encounter (Signed)
Dr Rush Landmark you do not have any availability until the end of June first of July.  Please advise

## 2020-06-16 ENCOUNTER — Other Ambulatory Visit: Payer: Self-pay

## 2020-06-16 ENCOUNTER — Other Ambulatory Visit: Payer: Medicare Other | Admitting: *Deleted

## 2020-06-16 DIAGNOSIS — E785 Hyperlipidemia, unspecified: Secondary | ICD-10-CM

## 2020-06-16 LAB — LIPID PANEL
Chol/HDL Ratio: 3.8 ratio (ref 0.0–4.4)
Cholesterol, Total: 218 mg/dL — ABNORMAL HIGH (ref 100–199)
HDL: 58 mg/dL (ref >39)
LDL Chol Calc (NIH): 129 mg/dL — ABNORMAL HIGH (ref 0–99)
Triglycerides: 174 mg/dL — ABNORMAL HIGH (ref 0–149)
VLDL Cholesterol Cal: 31 mg/dL (ref 5–40)

## 2020-06-16 NOTE — Telephone Encounter (Signed)
Please offer her what we have available, and I'll look to see if we can make any moves or add-on somewhere else. If she needs to have procedure at [facility] in interim because they are fast that is OK as well, we can only get done what and when we have time. Thanks. GM

## 2020-06-17 ENCOUNTER — Other Ambulatory Visit: Payer: Self-pay

## 2020-06-17 DIAGNOSIS — R935 Abnormal findings on diagnostic imaging of other abdominal regions, including retroperitoneum: Secondary | ICD-10-CM

## 2020-06-17 DIAGNOSIS — D3A8 Other benign neuroendocrine tumors: Secondary | ICD-10-CM

## 2020-06-17 NOTE — Telephone Encounter (Signed)
Anti coag letter sent to PCP Dr Quay Burow.   EUS scheduled, pt instructed and medications reviewed.  Patient instructions mailed to home.  Patient to call with any questions or concerns.

## 2020-06-17 NOTE — Telephone Encounter (Signed)
The pt has been scheduled for 08/06/20 at 730 am with Dr Rush Landmark at [facility] test on 6/20 at 1005 am

## 2020-06-18 ENCOUNTER — Encounter: Payer: Self-pay | Admitting: Internal Medicine

## 2020-06-19 NOTE — Telephone Encounter (Signed)
The patient has been notified of this information and all questions answered.

## 2020-06-19 NOTE — Telephone Encounter (Signed)
Therapist, music at [facility] [place], [place]  [address] Phone:  [phone]   Fax:  [phone]  Jun 18, 2020  Patient: [name]  MRN: [id number]  Date of Birth: 11/06/1945    Dear Timothy Lasso:   [name] is scheduled for a colonoscopy on 08/06/2020.  She should hold her Eliquis for 2 days prior to the procedure and restart as soon as possible after.         Sincerely,  [name]

## 2020-06-20 ENCOUNTER — Other Ambulatory Visit: Payer: Self-pay | Admitting: Internal Medicine

## 2020-06-22 ENCOUNTER — Encounter: Payer: Self-pay | Admitting: Internal Medicine

## 2020-06-22 NOTE — Progress Notes (Signed)
Subjective:    Patient ID: [name], female    DOB: 03-Sep-1945, 75 y.o.   MRN: [id number]  HPI The patient is here for follow up of their chronic medical problems, including htn, DM, dyslipidemia, CKD, leg edema, vitamin Def, ho PE  BP at home is well controlled.  90/68-130/80  ?  Neuroendocrine tumor has increased in size- will have needle bx at [facility].  This is scheduled for next month.  She has gained weight despite not eating much.  She is not exercising as she should.   Increased pain in right index finger.  She told rheum and he did not seem to think it was inflammatory arthritis..    Medications and allergies reviewed with patient and updated if appropriate.  Patient Active Problem List   Diagnosis Date Noted  . Left lower quadrant pain 04/16/2020  . Fatty (change of) liver, not elsewhere classified 04/16/2020  . CKD (chronic kidney disease) stage 3, GFR 30-59 ml/min (HCC) 02/24/2020  . Drug-induced systemic lupus erythematosus ([place]) 02/24/2020  . Aortic atherosclerosis ([place]) 11/04/2019  . Folic acid deficiency 73/53/2992  . Bilateral leg edema 03/19/2019  . Hemorrhoids 03/19/2019  . B12 deficiency 03/19/2019  . Abnormal endoscopy of upper gastrointestinal tract 02/27/2019  . Iron deficiency anemia 02/19/2019  . Hypomagnesemia   . Inflammatory arthritis 01/02/2019  . Positive ANA (antinuclear antibody) 01/02/2019  . Exocrine pancreatic insufficiency 10/23/2018  . Multiple pulmonary nodules determined by computed tomography of lung 09/18/2018  . Syncope 08/01/2018  . Hypokalemia 08/01/2018  . Pneumothorax 03/14/2018  . Lung abnormality 01/11/2018  . History of pulmonary embolism 12/24/2017  . Lung mass 12/24/2017  . Small vessel disease, cerebrovascular 07/31/2017  . Hyperuricemia 05/03/2017  . Arthralgia 05/01/2017  . Hair loss 05/01/2017  . Vitamin D deficiency 10/31/2016  . Diastolic dysfunction 42/68/3419  . Vertigo 12/24/2015  . Fatigue  12/24/2015  . Bilateral carotid artery disease, Mild 05/31/2015  . Thyroid nodule 05/31/2015  . Psoriasis   . Scoliosis   . Diabetes type 2, controlled ([place]) 06/03/2008  . Dyslipidemia 06/03/2008  . CARPAL TUNNEL SYNDROME 06/03/2008  . HTN (hypertension) 06/03/2008  . ALLERGIC RHINITIS 06/03/2008  . Asthma 06/03/2008  . GERD (gastroesophageal reflux disease) 06/03/2008  . Osteopenia 06/03/2008  . COLONIC POLYPS, HX OF 06/03/2008    Current Outpatient Medications on File Prior to Visit  Medication Sig Dispense Refill  . ammonium lactate (LAC-HYDRIN) 12 % lotion APPLY TO ENTIRE BODY TWICE DAILY 396 g 11  . apixaban (ELIQUIS) 5 MG TABS tablet Take 1 tablet (5 mg total) by mouth 2 (two) times daily. 60 tablet 5  . Ascorbic Acid (VITAMIN C GUMMIE PO) Take 1 tablet by mouth every evening.    . Cholecalciferol (VITAMIN D3 GUMMIES ADULT PO) Take 1 tablet by mouth every evening.    . clindamycin (CLEOCIN T) 1 % external solution Apply 1 application topically daily as needed (wounds).     . clobetasol (TEMOVATE) 0.05 % external solution APPLY TOPICALLY DAILY. TO AFFECTED AREAS 50 mL 5  . Coenzyme Q10 (COQ10 GUMMIES ADULT PO) Take 1 tablet by mouth every evening.    Marland Kitchen CREON 36000-114000 units CPEP capsule Take by mouth 3 (three) times daily before meals.    . CRESTOR 5 MG tablet TAKE 1 TABLET (5 MG TOTAL) BY MOUTH DAILY. (Patient taking differently: Take 5 mg by mouth daily. Patient takes medication 2xs a week instead of every day) 30 tablet 11  . folic acid (FOLVITE)  1 MG tablet Take 1 tablet (1 mg total) by mouth daily. 30 tablet 5  . halobetasol (ULTRAVATE) 0.05 % cream APPLY TOPICALLY 2 TIMES DAILY. 50 g 5  . icosapent Ethyl (VASCEPA) 1 g capsule Take 2 capsules (2 g total) by mouth 2 (two) times daily. (Patient taking differently: Take 2 g by mouth 2 (two) times daily. Patient) 120 capsule 5  . lisinopril (ZESTRIL) 10 MG tablet Take 1 tablet (10 mg total) by mouth daily. 30 tablet 5  .  loratadine (CLARITIN) 10 MG tablet Take 10 mg by mouth daily as needed for allergies.    . Magnesium 200 MG CHEW Chew 1 Dose by mouth daily.    . Multiple Vitamins-Minerals (ADULT GUMMY PO) Take 1 tablet by mouth every evening.    . nebivolol (BYSTOLIC) 2.5 MG tablet TAKE 1 TABLET BY MOUTH EVERY DAY 30 tablet 5  . omeprazole (PRILOSEC OTC) 20 MG tablet Take 20 mg by mouth daily.    . ondansetron (ZOFRAN-ODT) 8 MG disintegrating tablet Take 1 tablet (8 mg total) by mouth every 8 (eight) hours as needed. 30 tablet 5  . ONETOUCH ULTRA test strip USE TEST STRIPS TO CHECK BLOOD SUGAR AT LEAST 3 TIMES DAILY 300 strip 1  . oxymetazoline (AFRIN) 0.05 % nasal spray Place 1 spray into both nostrils 2 (two) times daily.    . vitamin B-12 (CYANOCOBALAMIN) 1000 MCG tablet Take 1,000 mcg by mouth daily.     No current facility-administered medications on file prior to visit.    Past Medical History:  Diagnosis Date  . ALLERGIC RHINITIS   . ANEMIA-NOS   . Arthritis   . ASTHMA   . Carpal tunnel syndrome   . COLONIC POLYPS, HX OF   . Coronary artery disease    "mild CAD" noted on 12/05/17 in coronary CT scan  . DIABETES MELLITUS, TYPE II    diet controlled  . GERD   . Hand tingling   . HYPERLIPIDEMIA   . HYPERTENSION   . Neuroendocrine tumor   . OSTEOPENIA   . PONV (postoperative nausea and vomiting)    " it relaxes my bladder muscles and I have been known to pee all over the place"  . Psoriasis    severe, began soriatane 01/2012  . Pulmonary embolism ([place])   . Rectal fissure   . Scoliosis   . Wears glasses     Past Surgical History:  Procedure Laterality Date  . benign rectal growth  2004   removed by Dr. Zella Richer  . BIOPSY  08/09/2018   Procedure: BIOPSY;  Surgeon: Carol Ada, MD;  Location: [facility];  Service: Endoscopy;;  . BIOPSY  08/22/2018   Procedure: BIOPSY;  Surgeon: Juanita Craver, MD;  Location: [facility];  Service: Endoscopy;;  . BIOPSY  03/07/2019   Procedure:  BIOPSY;  Surgeon: Irving Copas., MD;  Location: [facility];  Service: Gastroenterology;;  . BREAST BIOPSY Right   . BREAST EXCISIONAL BIOPSY Left   . BREAST SURGERY  1988   biopsy  . CESAREAN SECTION    . COLONOSCOPY N/A 08/22/2018   Procedure: COLONOSCOPY;  Surgeon: Juanita Craver, MD;  Location: [facility] ENDOSCOPY;  Service: Endoscopy;  Laterality: N/A;  . ENDOSCOPIC MUCOSAL RESECTION N/A 03/07/2019   Procedure: ENDOSCOPIC MUCOSAL RESECTION;  Surgeon: Rush Landmark Telford Nab., MD;  Location: [facility];  Service: Gastroenterology;  Laterality: N/A;  . ESOPHAGOGASTRODUODENOSCOPY (EGD) WITH PROPOFOL N/A 08/04/2018   Procedure: ESOPHAGOGASTRODUODENOSCOPY (EGD) WITH PROPOFOL;  Surgeon: Ladene Artist,  MD;  Location: [facility];  Service: Gastroenterology;  Laterality: N/A;  . ESOPHAGOGASTRODUODENOSCOPY (EGD) WITH PROPOFOL N/A 03/07/2019   Procedure: ESOPHAGOGASTRODUODENOSCOPY (EGD) WITH PROPOFOL;  Surgeon: Rush Landmark Telford Nab., MD;  Location: [facility];  Service: Gastroenterology;  Laterality: N/A;  . EUS N/A 03/07/2019   Procedure: UPPER ENDOSCOPIC ULTRASOUND (EUS) RADIAL;  Surgeon: Rush Landmark Telford Nab., MD;  Location: [facility];  Service: Gastroenterology;  Laterality: N/A;  . FINE NEEDLE ASPIRATION  03/07/2019   Procedure: FINE NEEDLE ASPIRATION (FNA) LINEAR;  Surgeon: Irving Copas., MD;  Location: [facility];  Service: Gastroenterology;;  . Otho Darner SIGMOIDOSCOPY N/A 08/04/2018   Procedure: FLEXIBLE SIGMOIDOSCOPY;  Surgeon: Ladene Artist, MD;  Location: [facility];  Service: Gastroenterology;  Laterality: N/A;  . FLEXIBLE SIGMOIDOSCOPY N/A 08/09/2018   Procedure: FLEXIBLE SIGMOIDOSCOPY;  Surgeon: Carol Ada, MD;  Location: [place];  Service: Endoscopy;  Laterality: N/A;  . HEMOSTASIS CLIP PLACEMENT  03/07/2019   Procedure: HEMOSTASIS CLIP PLACEMENT;  Surgeon: Irving Copas., MD;  Location: [place];  Service: Gastroenterology;;  . Lia Foyer  LIFTING INJECTION  03/07/2019   Procedure: SUBMUCOSAL LIFTING INJECTION;  Surgeon: Irving Copas., MD;  Location: [place];  Service: Gastroenterology;;  . VIDEO BRONCHOSCOPY WITH ENDOBRONCHIAL NAVIGATION N/A 03/14/2018   Procedure: VIDEO BRONCHOSCOPY WITH ENDOBRONCHIAL NAVIGATION;  Surgeon: Collene Gobble, MD;  Location: [facility];  Service: Thoracic;  Laterality: N/A;    Social History   Socioeconomic History  . Marital status: Single    Spouse name: Not on file  . Number of children: 1  . Years of education: 13  . Highest education level: Bachelor's degree (e.g., BA, AB, BS)  Occupational History  . Occupation: retired Careers adviser  Tobacco Use  . Smoking status: Former Smoker    Packs/day: 1.00    Years: 10.00    Pack years: 10.00    Quit date: 02/14/1974    Years since quitting: 46.3  . Smokeless tobacco: Never Used  Vaping Use  . Vaping Use: Never used  Substance and Sexual Activity  . Alcohol use: No  . Drug use: No  . Sexual activity: Not on file  Other Topics Concern  . Not on file  Social History Narrative   Lives alone in a one story home.  Has one child and one grandchild.  Retired Careers adviser.  Education: college.    Social Determinants of Health   Financial Resource Strain: Low Risk   . Difficulty of Paying Living Expenses: Not hard at all  Food Insecurity: No Food Insecurity  . Worried About Charity fundraiser in the Last Year: Never true  . Ran Out of Food in the Last Year: Never true  Transportation Needs: No Transportation Needs  . Lack of Transportation (Medical): No  . Lack of Transportation (Non-Medical): No  Physical Activity: Inactive  . Days of Exercise per Week: 0 days  . Minutes of Exercise per Session: 0 min  Stress: No Stress Concern Present  . Feeling of Stress : Not at all  Social Connections: Unknown  . Frequency of Communication with Friends and Family: More than  three times a week  . Frequency of Social Gatherings with Friends and Family: Once a week  . Attends Religious Services: Patient refused  . Active Member of Clubs or Organizations: Patient refused  . Attends Archivist Meetings: Patient refused  . Marital Status: Patient refused    Family History  Problem Relation Age  of Onset  . Lung cancer Father   . Arthritis Other        Parents  . Asthma Other        parent, other relative  . Breast cancer Other        other relative  . Hypertension Other        parent, other relative  . Heart disease Other        parent, other relative  . Heart disease Mother   . Asthma Mother   . Breast cancer Maternal Aunt 68  . Parkinson's disease Maternal Grandmother   . Rheumatic fever Maternal Grandfather     Review of Systems  Constitutional: Positive for unexpected weight change (weight gain). Negative for chills and fever.  Respiratory: Positive for cough and shortness of breath. Negative for wheezing.   Cardiovascular: Positive for leg swelling (occ, LLE). Negative for chest pain and palpitations.  Gastrointestinal: Positive for constipation. Negative for abdominal pain and diarrhea.  Neurological: Positive for light-headedness and headaches (occ).       Objective:   Vitals:   06/23/20 0754  BP: (!) 142/80  Pulse: (!) 58  Temp: 98.2 F (36.8 C)  SpO2: 98%   BP Readings from Last 3 Encounters:  06/23/20 (!) 142/80  04/02/20 (!) 150/90  02/24/20 (!) 150/80   Wt Readings from Last 3 Encounters:  06/23/20 153 lb 9.6 oz (69.7 kg)  04/02/20 152 lb 12.8 oz (69.3 kg)  02/24/20 149 lb (67.6 kg)   Body mass index is 29.02 kg/m.   Physical Exam    Constitutional: Appears well-developed and well-nourished. No distress.  HENT:  Head: Normocephalic and atraumatic.  Neck: Neck supple. No tracheal deviation present. No thyromegaly present.  No cervical lymphadenopathy Cardiovascular: Normal rate, regular rhythm and normal  heart sounds.   No murmur heard. No carotid bruit .  No edema Pulmonary/Chest: Effort normal and breath sounds normal. No respiratory distress. No has no wheezes. No rales.  Skin: Skin is warm and dry. Not diaphoretic.  Psychiatric: Normal mood and affect. Behavior is normal.      Assessment & Plan:    See Problem List for Assessment and Plan of chronic medical problems.    This visit occurred during the SARS-CoV-2 public health emergency.  Safety protocols were in place, including screening questions prior to the visit, additional usage of staff PPE, and extensive cleaning of exam room while observing appropriate contact time as indicated for disinfecting solutions.

## 2020-06-22 NOTE — Patient Instructions (Addendum)
  Blood work was ordered.     Medications changes include :   none  Your prescription(s) have been submitted to your pharmacy. Please take as directed and contact our office if you believe you are having problem(s) with the medication(s).    Please followup in 4 months

## 2020-06-23 ENCOUNTER — Other Ambulatory Visit: Payer: Self-pay

## 2020-06-23 ENCOUNTER — Ambulatory Visit (INDEPENDENT_AMBULATORY_CARE_PROVIDER_SITE_OTHER): Payer: Medicare Other | Admitting: Internal Medicine

## 2020-06-23 VITALS — BP 142/80 | HR 58 | Temp 98.2°F | Ht 61.0 in | Wt 153.6 lb

## 2020-06-23 DIAGNOSIS — E559 Vitamin D deficiency, unspecified: Secondary | ICD-10-CM | POA: Diagnosis not present

## 2020-06-23 DIAGNOSIS — E538 Deficiency of other specified B group vitamins: Secondary | ICD-10-CM | POA: Diagnosis not present

## 2020-06-23 DIAGNOSIS — N1831 Chronic kidney disease, stage 3a: Secondary | ICD-10-CM | POA: Diagnosis not present

## 2020-06-23 DIAGNOSIS — K760 Fatty (change of) liver, not elsewhere classified: Secondary | ICD-10-CM

## 2020-06-23 DIAGNOSIS — Z86711 Personal history of pulmonary embolism: Secondary | ICD-10-CM

## 2020-06-23 DIAGNOSIS — I700 Atherosclerosis of aorta: Secondary | ICD-10-CM

## 2020-06-23 DIAGNOSIS — E785 Hyperlipidemia, unspecified: Secondary | ICD-10-CM

## 2020-06-23 DIAGNOSIS — I10 Essential (primary) hypertension: Secondary | ICD-10-CM

## 2020-06-23 DIAGNOSIS — K8681 Exocrine pancreatic insufficiency: Secondary | ICD-10-CM

## 2020-06-23 DIAGNOSIS — K909 Intestinal malabsorption, unspecified: Secondary | ICD-10-CM | POA: Insufficient documentation

## 2020-06-23 DIAGNOSIS — E118 Type 2 diabetes mellitus with unspecified complications: Secondary | ICD-10-CM | POA: Diagnosis not present

## 2020-06-23 LAB — CBC WITH DIFFERENTIAL/PLATELET
Basophils Absolute: 0.2 10*3/uL — ABNORMAL HIGH (ref 0.0–0.1)
Basophils Relative: 2.3 % (ref 0.0–3.0)
Eosinophils Absolute: 0.3 10*3/uL (ref 0.0–0.7)
Eosinophils Relative: 4.1 % (ref 0.0–5.0)
HCT: 39.4 % (ref 36.0–46.0)
Hemoglobin: 13.1 g/dL (ref 12.0–15.0)
Lymphocytes Relative: 26.2 % (ref 12.0–46.0)
Lymphs Abs: 1.7 10*3/uL (ref 0.7–4.0)
MCHC: 33.3 g/dL (ref 30.0–36.0)
MCV: 94.1 fl (ref 78.0–100.0)
Monocytes Absolute: 0.5 10*3/uL (ref 0.1–1.0)
Monocytes Relative: 7.1 % (ref 3.0–12.0)
Neutro Abs: 4 10*3/uL (ref 1.4–7.7)
Neutrophils Relative %: 60.3 % (ref 43.0–77.0)
Platelets: 275 10*3/uL (ref 150.0–400.0)
RBC: 4.19 Mil/uL (ref 3.87–5.11)
RDW: 14.5 % (ref 11.5–15.5)
WBC: 6.6 10*3/uL (ref 4.0–10.5)

## 2020-06-23 LAB — COMPREHENSIVE METABOLIC PANEL
ALT: 27 U/L (ref 0–35)
AST: 23 U/L (ref 0–37)
Albumin: 4.1 g/dL (ref 3.5–5.2)
Alkaline Phosphatase: 77 U/L (ref 39–117)
BUN: 21 mg/dL (ref 6–23)
CO2: 25 mEq/L (ref 19–32)
Calcium: 9.7 mg/dL (ref 8.4–10.5)
Chloride: 100 mEq/L (ref 96–112)
Creatinine, Ser: 1.1 mg/dL (ref 0.40–1.20)
GFR: 48.76 mL/min — ABNORMAL LOW (ref >60.00)
Glucose, Bld: 120 mg/dL — ABNORMAL HIGH (ref 70–99)
Potassium: 4.2 mEq/L (ref 3.5–5.1)
Sodium: 135 mEq/L (ref 135–145)
Total Bilirubin: 0.7 mg/dL (ref 0.2–1.2)
Total Protein: 7.5 g/dL (ref 6.0–8.3)

## 2020-06-23 LAB — VITAMIN D 25 HYDROXY (VIT D DEFICIENCY, FRACTURES): VITD: 45.51 ng/mL (ref 30.00–100.00)

## 2020-06-23 LAB — HEMOGLOBIN A1C: Hgb A1c MFr Bld: 6.7 % — ABNORMAL HIGH (ref 4.6–6.5)

## 2020-06-23 LAB — PROTIME-INR
INR: 1.9 ratio — ABNORMAL HIGH (ref 0.8–1.0)
Prothrombin Time: 20.9 s — ABNORMAL HIGH (ref 9.6–13.1)

## 2020-06-23 LAB — VITAMIN B12: Vitamin B-12: 1539 pg/mL — ABNORMAL HIGH (ref 211–911)

## 2020-06-23 MED ORDER — ONDANSETRON 8 MG PO TBDP: 8.0000 mg | ORAL_TABLET | Freq: Three times a day (TID) | ORAL | 5 refills | Status: DC | PRN

## 2020-06-23 NOTE — Assessment & Plan Note (Addendum)
Chronic Diet controlled Lab Results  Component Value Date   HGBA1C 6.4 02/24/2020   Check a1c today Encouraged low sugar diet and regular exercise

## 2020-06-23 NOTE — Assessment & Plan Note (Addendum)
H/o PE - unprovoked and therefore needs lifelong a/c Continue eliquis 5 mg bid cmp Cbc normal Jan '22

## 2020-06-23 NOTE — Assessment & Plan Note (Signed)
Chronic Taking vitamin D daily Check vitamin D level  

## 2020-06-23 NOTE — Assessment & Plan Note (Addendum)
Chronic Continue crestor 5 mg twice a week- she has not tolerated statins well so we will likely not be able to increase dose LDL much improved with above dose, but still not at goal Encouraged regular exercise, healthy diet

## 2020-06-23 NOTE — Assessment & Plan Note (Addendum)
Chronic  Continue crestor 5 mg twice a week Regular exercise and healthy diet encouraged

## 2020-06-23 NOTE — Assessment & Plan Note (Signed)
Chronic Has had no absorption in the past, especially when she had chronic diarrhea Concerned whether she is absorbing some of her vitamins and nutrients We will check vitamin D, E, K A levels along with vitamin B12

## 2020-06-23 NOTE — Assessment & Plan Note (Addendum)
Chronic Does not take nsaids Continue increased fluids cmp today

## 2020-06-23 NOTE — Assessment & Plan Note (Signed)
Chronic Following with GI Taking Creon as prescribed

## 2020-06-23 NOTE — Assessment & Plan Note (Signed)
Chronic Taking vitamin B12 Will check vitamin B12 level

## 2020-06-23 NOTE — Assessment & Plan Note (Signed)
Chronic BP well controlled at home, elevated here as usual Continue bystolic 2.5 mg qd, lisinopril 10 mg qd cmp

## 2020-06-23 NOTE — Assessment & Plan Note (Signed)
Has been trying to work on weight loss, but has gained weight CMP today Continue weight loss efforts

## 2020-07-01 LAB — VITAMIN K1, SERUM: Vitamin K: 497 pg/mL (ref 130–1500)

## 2020-07-01 LAB — VITAMIN A: Vitamin A (Retinoic Acid): 65 ug/dL (ref 38–98)

## 2020-07-01 LAB — VITAMIN E
Gamma-Tocopherol (Vit E): <1 mg/L (ref <=4.3)
Vitamin E (Alpha Tocopherol): 29.4 mg/L — ABNORMAL HIGH (ref 5.7–19.9)

## 2020-07-02 ENCOUNTER — Telehealth: Payer: Self-pay | Admitting: Internal Medicine

## 2020-07-02 NOTE — Progress Notes (Signed)
  Chronic Care Management   Note  07/02/2020 Name: [name] MRN: [id number] DOB: 1945/05/11  [name] is a 75 y.o. year old female who is a primary care patient of Burns, Claudina Lick, MD. I reached out to [employer] by phone today in response to a referral sent by [name] PCP, [name], MD.   [name] was given information about Chronic Care Management services today including:  1. CCM service includes personalized support from designated clinical staff supervised by her physician, including individualized plan of care and coordination with other care providers 2. 24/7 contact phone numbers for assistance for urgent and routine care needs. 3. Service will only be billed when office clinical staff spend 20 minutes or more in a month to coordinate care. 4. Only one practitioner may furnish and bill the service in a calendar month. 5. The patient may stop CCM services at any time (effective at the end of the month) by phone call to the office staff.   Patient agreed to services and verbal consent obtained.   Follow up plan:  [place]

## 2020-07-05 ENCOUNTER — Other Ambulatory Visit: Payer: Self-pay | Admitting: Internal Medicine

## 2020-07-05 DIAGNOSIS — N1831 Chronic kidney disease, stage 3a: Secondary | ICD-10-CM

## 2020-07-05 NOTE — Progress Notes (Signed)
Ref nephro

## 2020-07-14 NOTE — Progress Notes (Signed)
Chronic Care Management Pharmacy Note  07/16/2020 Name:  [name] MRN:  [id number] DOB:  1945/06/13  Subjective: [name] is an 75 y.o. year old female who is a primary patient of Burns, Claudina Lick, MD.  The CCM team was consulted for assistance with disease management and care coordination needs.    Engaged with patient by telephone for initial visit in response to provider referral for pharmacy case management and/or care coordination services.   Consent to Services:  The patient was given the following information about Chronic Care Management services today, agreed to services, and gave verbal consent: 1. CCM service includes personalized support from designated clinical staff supervised by the primary care provider, including individualized plan of care and coordination with other care providers 2. 24/7 contact phone numbers for assistance for urgent and routine care needs. 3. Service will only be billed when office clinical staff spend 20 minutes or more in a month to coordinate care. 4. Only one practitioner may furnish and bill the service in a calendar month. 5.The patient may stop CCM services at any time (effective at the end of the month) by phone call to the office staff. 6. The patient will be responsible for cost sharing (co-pay) of up to 20% of the service fee (after annual deductible is met). Patient agreed to services and consent obtained.  Patient Care Team: [name], MD as PCP - General (Internal Medicine) Dorothy Spark, MD (Inactive) as PCP - Cardiology (Cardiology) Rolm Bookbinder, MD (Dermatology) Marica Otter, OD (Optometry) Juanita Craver, MD as Consulting Physician (Gastroenterology) Starling Manns, MD (Orthopedic Surgery) Tomasa Blase, [facility] as Pharmacist (Pharmacist)  Recent office visits: 06/23/2020 - PCP visit - no changes to medications 02/24/2020 - PCP visit - restart pitavastatin at once weekly - referred to nephrology    Recent  consult visits: 04/22/2020 - Dr. Earlean Shawl Gertie Fey - CT enterography - attempted to taper dose of Creon  - returned to previous dose  04/16/2020 - [place] PharmD - switched from pitavastatin 49m weekly to crestor 551mtwice weekly  04/06/2020 -Dr. GoBaxter Flattery Rheumatology - evaluated for polyarthralgia - possible trial of enbrel planned, plan is to wait due to COVID  - no changes at this time   04/02/2020 - Dr. NeMeda Coffee Cardiology - seen for worsening SOB / HTN - EKG performed - normal - no changes to medications, referred to lipid clinic to discuss options for LDL control  03/09/2020 - Dr. FeLyman Speller Dermatology - psoriasis f/u  -Continue clobetasol, halobetasol , Start calcipotriene 0.05% creamand Lac-Hydrin 12% lotion  01/29/2020 - Dr. MeEarlean Shawl Gertie Fey trial of creon taper to decrease pill burder   Hospital visits: None in previous 6 months  Objective:  Lab Results  Component Value Date   CREATININE 1.10 06/23/2020   BUN 21 06/23/2020   GFR 48.76 (L) 06/23/2020   GFRNONAA 50 (L) 11/05/2019   GFRAA 58 (L) 11/05/2019   NA 135 06/23/2020   K 4.2 06/23/2020   CALCIUM 9.7 06/23/2020   CO2 25 06/23/2020   GLUCOSE 120 (H) 06/23/2020    Lab Results  Component Value Date/Time   HGBA1C 6.7 (H) 06/23/2020 08:37 AM   HGBA1C 6.4 02/24/2020 10:01 AM   GFR 48.76 (L) 06/23/2020 08:37 AM   GFR 52.27 (L) 02/24/2020 10:01 AM   MICROALBUR <0.7 10/28/2015 09:18 AM   MICROALBUR <0.7 12/23/2014 12:02 PM    Last diabetic Eye exam:  Lab  Results  Component Value Date/Time   HMDIABEYEEXA No Retinopathy 07/01/2016 12:00 AM    Last diabetic Foot exam:  No results found for: HMDIABFOOTEX   Lab Results  Component Value Date   CHOL 218 (H) 06/16/2020   HDL 58 06/16/2020   LDLCALC 129 (H) 06/16/2020   LDLDIRECT 193.0 02/24/2020   TRIG 174 (H) 06/16/2020   CHOLHDL 3.8 06/16/2020    Hepatic Function Latest Ref Rng & Units 06/23/2020 02/24/2020 10/14/2019  Total Protein 6.0 - 8.3 g/dL  7.5 7.5 7.2  Albumin 3.5 - 5.2 g/dL 4.1 4.2 4.2  AST 0 - 37 U/L '23 27 16  ' ALT 0 - 35 U/L 27 37(H) 20  Alk Phosphatase 39 - 117 U/L 77 87 115  Total Bilirubin 0.2 - 1.2 mg/dL 0.7 0.5 0.4  Bilirubin, Direct 0.00 - 0.40 mg/dL - - 0.11    Lab Results  Component Value Date/Time   TSH 3.00 02/24/2020 10:01 AM   TSH 2.51 09/20/2019 09:22 AM   FREET4 0.75 05/01/2017 09:01 AM    CBC Latest Ref Rng & Units 06/23/2020 02/24/2020 09/20/2019  WBC 4.0 - 10.5 K/uL 6.6 7.5 11.3(H)  Hemoglobin 12.0 - 15.0 g/dL 13.1 13.0 12.0  Hematocrit 36.0 - 46.0 % 39.4 39.1 35.6  Platelets 150.0 - 400.0 K/uL 275.0 304.0 304    Lab Results  Component Value Date/Time   VD25OH 45.51 06/23/2020 08:37 AM   VD25OH 50.62 02/24/2020 10:01 AM    Clinical ASCVD: No  The 10-year ASCVD risk score Mikey Bussing DC Jr., et al., 2013) is: 47.1%   Values used to calculate the score:     Age: 72 years     Sex: Female     Is Non-Hispanic African American: No     Diabetic: Yes     Tobacco smoker: No     Systolic Blood Pressure: 546 mmHg     Is BP treated: Yes     HDL Cholesterol: 58 mg/dL     Total Cholesterol: 218 mg/dL    Depression screen [facility] 2/9 02/24/2020 01/02/2019 01/10/2018  Decreased Interest 0 0 0  Down, Depressed, Hopeless 1 0 0  PHQ - 2 Score 1 0 0  Some recent data might be hidden      Social History   Tobacco Use  Smoking Status Former Smoker  . Packs/day: 1.00  . Years: 10.00  . Pack years: 10.00  . Quit date: 02/14/1974  . Years since quitting: 46.4  Smokeless Tobacco Never Used   BP Readings from Last 3 Encounters:  06/23/20 (!) 142/80  04/02/20 (!) 150/90  02/24/20 (!) 150/80   Pulse Readings from Last 3 Encounters:  06/23/20 (!) 58  04/02/20 73  02/24/20 63   Wt Readings from Last 3 Encounters:  06/23/20 153 lb 9.6 oz (69.7 kg)  04/02/20 152 lb 12.8 oz (69.3 kg)  02/24/20 149 lb (67.6 kg)   BMI Readings from Last 3 Encounters:  06/23/20 29.02 kg/m  04/02/20 28.87 kg/m  02/24/20  28.15 kg/m    Assessment/Interventions: Review of patient past medical history, allergies, medications, health status, including review of consultants reports, laboratory and other test data, was performed as part of comprehensive evaluation and provision of chronic care management services.   SDOH:  (Social Determinants of Health) assessments and interventions performed: Yes  SDOH Screenings   Alcohol Screen: Low Risk   . Last Alcohol Screening Score (AUDIT): 0  Depression (PHQ2-9): Low Risk   . PHQ-2 Score: 1  Financial Resource  Strain: Low Risk   . Difficulty of Paying Living Expenses: Not hard at all  Food Insecurity: No Food Insecurity  . Worried About Charity fundraiser in the Last Year: Never true  . Ran Out of Food in the Last Year: Never true  Housing: Low Risk   . Last Housing Risk Score: 0  Physical Activity: Inactive  . Days of Exercise per Week: 0 days  . Minutes of Exercise per Session: 0 min  Social Connections: Unknown  . Frequency of Communication with Friends and Family: More than three times a week  . Frequency of Social Gatherings with Friends and Family: Once a week  . Attends Religious Services: Patient refused  . Active Member of Clubs or Organizations: Patient refused  . Attends Archivist Meetings: Patient refused  . Marital Status: Patient refused  Stress: No Stress Concern Present  . Feeling of Stress : Not at all  Tobacco Use: Medium Risk  . Smoking Tobacco Use: Former Smoker  . Smokeless Tobacco Use: Never Used  Transportation Needs: No Transportation Needs  . Lack of Transportation (Medical): No  . Lack of Transportation (Non-Medical): No    CCM Care Plan  Allergies  Allergen Reactions  . Phenergan [Promethazine Hcl] Shortness Of Breath and Anxiety  . Repatha [Evolocumab]     Muscle pain and weakness  . Amlodipine Swelling  . Clarithromycin Nausea And Vomiting    May have episodes of syncope due to prolonged vomiting   .  Codeine Nausea And Vomiting    May have episodes of syncope due to prolonged vomiting  . Erythromycin Nausea And Vomiting  . Hydralazine Hcl     Drug-induced lupus - pt wishes to avoid  . Statins     Muscle pain   . Tetracycline Nausea And Vomiting    Medications Reviewed Today    Reviewed by Tomasa Blase, [facility] (Pharmacist) on 07/15/20 at [place] Status: <None>  Medication Order Taking? Sig Documenting Provider Last Dose Status Informant  ammonium lactate (LAC-HYDRIN) 12 % lotion [id number] Yes APPLY TO ENTIRE BODY TWICE DAILY  Taking Active   apixaban (ELIQUIS) 5 MG TABS tablet [id number] Yes Take 1 tablet (5 mg total) by mouth 2 (two) times daily. [name], MD Taking Active   Ascorbic Acid (VITAMIN C GUMMIE PO) [id number] Yes Take 188 mg by mouth every evening. Patient takes 2 gummies which are 47m each [provider] Taking Active Self  calcium carbonate (TUMS - DOSED IN MG ELEMENTAL CALCIUM) 500 MG chewable tablet [id number] Chew 1 tablet by mouth daily. [provider] Taking Active   Cholecalciferol (VITAMIN D3 GUMMIES ADULT PO) [id number] Take 2 tablets by mouth every evening. 3000 IU total daily dose [provider] Taking Active Self  clindamycin (CLEOCIN T) 1 % external solution [id number] Apply 1 application topically daily as needed (wounds).  [provider] Taking Active Self  clobetasol (TEMOVATE) 0.05 % external solution [id number] APPLY TOPICALLY DAILY. TO AFFECTED AREAS  Taking Active   Coenzyme Q10 (COQ10 GUMMIES ADULT PO) [id number] Take 1 tablet by mouth in the morning and at bedtime. [provider] Taking Active Self  CREON 36000-114000 units CPEP capsule [id number] Take by mouth 2 (two) times daily with a meal. [provider] Taking Active   CRESTOR 5 MG tablet [id number] TAKE 1 TABLET (5 MG TOTAL) BY MOUTH DAILY.  Patient taking differently: Take 5 mg by mouth daily. Patient  takes medication 2xs a week instead of every day   Dorothy Spark, MD Taking Active   diclofenac Sodium (VOLTAREN) 1 % GEL [id number] Yes Apply 2 g topically 4 (four) times daily as needed. [provider] Taking Active   diphenhydrAMINE (BENADRYL) 12.5 MG chewable tablet [id number] Yes Chew 12.5 mg by mouth at bedtime. [provider] Taking Active   folic acid (FOLVITE) 1 MG tablet [id number] Yes Take 1 tablet (1 mg total) by mouth daily. [name], MD Taking Active   halobetasol (ULTRAVATE) 0.05 % cream [id number] Yes APPLY TOPICALLY 2 TIMES DAILY.  Taking Active   icosapent Ethyl (VASCEPA) 1 g capsule [id number] Yes Take 2 capsules (2 g total) by mouth 2 (two) times daily.  Patient taking differently: Take 1 g by mouth 2 (two) times daily. Patient   Dorothy Spark, MD Taking Active   lisinopril (ZESTRIL) 10 MG tablet [id number] Yes Take 1 tablet (10 mg total) by mouth daily. [name], MD Taking Active   loperamide (LOPERAMIDE A-D) 2 MG tablet [id number] Yes Take 2 mg by mouth 4 (four) times daily as needed for diarrhea or loose stools. [provider] Taking Active   loratadine (CLARITIN) 10 MG tablet [id number] Yes Take 10 mg by mouth daily as needed for allergies. [provider] Taking Active Self  Magnesium 200 MG CHEW [id number] Yes Chew 100 mg by mouth daily. [provider] Taking Active   Multiple Vitamins-Minerals (ADULT GUMMY PO) [id number] Yes Take 1 tablet by mouth every evening. [place] gummie formula [provider] Taking Active Self  nebivolol (BYSTOLIC) 2.5 MG tablet [id number] Yes TAKE 1 TABLET BY MOUTH EVERY DAY Burns, Claudina Lick, MD Taking Active   omeprazole (PRILOSEC OTC) 20 MG tablet [id number] Yes Take 20 mg by mouth daily. [provider] Taking Active   ondansetron (ZOFRAN-ODT) 8 MG disintegrating tablet [id number] Yes Take 1 tablet (8 mg total) by mouth every 8 (eight) hours as needed. [name],  MD Taking Active   [facility] ULTRA test strip [id number] Yes USE TEST STRIPS TO CHECK BLOOD SUGAR AT LEAST 3 TIMES DAILY Burns, Claudina Lick, MD Taking Active   oxymetazoline (AFRIN) 0.05 % nasal spray [id number] Yes Place 1 spray into both nostrils 2 (two) times daily. [provider] Taking Active Self  polyethylene glycol (MIRALAX / GLYCOLAX) 17 g packet [id number] Yes Take 17 g by mouth daily. [provider] Taking Active   senna (SENOKOT) 8.6 MG TABS tablet [id number] Yes Take 1 tablet by mouth daily as needed for mild constipation or moderate constipation. [provider] Taking Active   vitamin B-12 (CYANOCOBALAMIN) 1000 MCG tablet [id number] Yes Take 1,000 mcg by mouth daily. [provider] Taking Active           Patient Active Problem List   Diagnosis Date Noted  . Malabsorption 06/23/2020  . Left lower quadrant pain 04/16/2020  . Fatty (change of) liver, not elsewhere classified 04/16/2020  . CKD (chronic kidney disease) stage 3, GFR 30-59 ml/min (HCC) 02/24/2020  . Drug-induced systemic lupus erythematosus ([place]) 02/24/2020  . Aortic atherosclerosis ([place]) 11/04/2019  . Folic acid deficiency 35/46/5681  . Bilateral leg edema 03/19/2019  . Hemorrhoids 03/19/2019  . B12 deficiency 03/19/2019  . Abnormal endoscopy of upper gastrointestinal tract 02/27/2019  . Iron deficiency anemia 02/19/2019  . Hypomagnesemia   . Inflammatory arthritis 01/02/2019  . Positive ANA (antinuclear antibody) 01/02/2019  . Exocrine pancreatic insufficiency 10/23/2018  . Multiple pulmonary  nodules determined by computed tomography of lung 09/18/2018  . Syncope 08/01/2018  . Hypokalemia 08/01/2018  . Pneumothorax 03/14/2018  . Lung abnormality 01/11/2018  . History of pulmonary embolism 12/24/2017  . Lung mass 12/24/2017  . Small vessel disease, cerebrovascular 07/31/2017  . Hyperuricemia 05/03/2017  . Arthralgia 05/01/2017  . Hair loss 05/01/2017  . Vitamin D  deficiency 10/31/2016  . Diastolic dysfunction 70/48/8891  . Vertigo 12/24/2015  . Fatigue 12/24/2015  . Bilateral carotid artery disease, Mild 05/31/2015  . Thyroid nodule 05/31/2015  . Psoriasis   . Scoliosis   . Diabetes type 2, controlled ([place]) 06/03/2008  . Dyslipidemia 06/03/2008  . CARPAL TUNNEL SYNDROME 06/03/2008  . HTN (hypertension) 06/03/2008  . ALLERGIC RHINITIS 06/03/2008  . Asthma 06/03/2008  . GERD (gastroesophageal reflux disease) 06/03/2008  . Osteopenia 06/03/2008  . COLONIC POLYPS, HX OF 06/03/2008    Immunization History  Administered Date(s) Administered  . Fluad Quad(high Dose 65+) 10/23/2018, 11/05/2019  . Influenza Split 11/29/2010, 11/15/2011  . Influenza, High Dose Seasonal PF 12/28/2012, 10/28/2015, 10/31/2016, 10/30/2017  . Influenza,inj,Quad PF,6+ Mos 10/22/2013, 11/07/2014  . PFIZER(Purple Top)SARS-COV-2 Vaccination 03/13/2019, 04/03/2019, 10/15/2019  . PPD Test 08/27/2012, 11/06/2014, 01/18/2016  . Pneumococcal Conjugate-13 10/22/2013  . Pneumococcal Polysaccharide-23 02/14/2002, 12/23/2014  . Td 02/15/2004, 06/03/2014    Conditions to be addressed/monitored:  Hypertension, Hyperlipidemia, Diabetes, GERD, Chronic Kidney Disease, Allergic Rhinitis and Psoriasis   Care Plan : CCM Care Plan  Updates made by Tomasa Blase, RPH since 07/16/2020 12:00 AM    Problem: HTN, CAD, HLD, DM2 (diet controlled), Psoriasis   Priority: High  Onset Date: 07/15/2020    Long-Range Goal: Disease Management   Start Date: 07/15/2020  Expected End Date: 01/14/2021  This Visit's Progress: On track  Priority: High  Note:   Current Barriers:  . Unable to independently monitor therapeutic efficacy . Unable to achieve control of LDL    Pharmacist Clinical Goal(s):  Marland Kitchen Patient will verbalize ability to afford treatment regimen . achieve adherence to monitoring guidelines and medication adherence to achieve therapeutic efficacy . achieve control of LDL  as evidenced  by next lipid panel . maintain control of blood pressure  as evidenced by continued blood pressure control   through collaboration with PharmD and provider.   Interventions: . 1:1 collaboration with [name], MD regarding development and update of comprehensive plan of care as evidenced by provider attestation and co-signature . Inter-disciplinary care team collaboration (see longitudinal plan of care) . Comprehensive medication review performed; medication list updated in electronic medical record  Hypertension (BP goal <140/90) -Controlled -Current treatment: . Lisinopril 32m daily  . Nebivolol 2.570mdaily  -Medications previously tried: amlodipine, hyralazine, furosemide, olmesartan, metoprolol   -Current home readings: highest 130/70 - usually can average 120/70-60 -Current dietary habits: watches her sodium intake, staying hydrated throughout the day -Current exercise habits: previously had been walking 2-3 miles daily, recently has not been as active as she previously had been  -Denies hypotensive/hypertensive symptoms -Educated on BP goals and benefits of medications for prevention of heart attack, stroke and kidney damage; Daily salt intake goal < 2300 mg; Exercise goal of 150 minutes per week; Importance of home blood pressure monitoring; -Counseled to monitor BP at home weekly to biweekly, document, and provide log at future appointments -Recommended to continue current medication  Hyperlipidemia: (LDL goal < 70) -Not ideally controlled  - LDL 129 mg/dL - 06/15/2020 - improved  -Current treatment: . Rosuvastatin 56m27mwice weekly  . Co-Q-10 -  1 tablet twice daily  . Vascepa 1g - 1 capsule twice daily (patient has concerns about bleeding) -Medications previously tried: pitavastatin, pravastatin, ezetimibe, repatha -Current dietary patterns: small amounts of meat / protein - pork tenderloins ( no more than 2oz at a time) / small amount of chicken / ground beef  -Current  exercise habits: used to walk 2-3 miles daily - recently has not been as active, is trying to get back into walking more consistently  -Educated on Cholesterol goals;  Benefits of statin for ASCVD risk reduction; Importance of limiting foods high in cholesterol; Exercise goal of 150 minutes per week; Strategies to manage statin-induced myalgias; -Recommended to continue current medication Assessed previous medications that patient has tried and failed to help with cholesterol levels, patient working with lipid clinic , LDL has improved since starting crestor 53m  twice weekly - has been the medication she has tolerated best, declined trial of praluent due to previous issues with repatha   Diabetes (A1c goal <7%) -Controlled - diet controlled  - Last A1c 6.7% - Last 06/23/2020 -Current medications: . n/a -Medications previously tried: n/a  -Denies hypoglycemic/hyperglycemic symptoms -Current meal patterns:  . breakfast: cup of tea / 1/2 cup of coffee, water, yogurt (whole milk) - no artificial sweetener   . lunch: left overs, stir fry, sandwich (lite bread) cucumbers and peppers  . dinner: small portion of protein, stir fry, filet mignon - reports that she does not go over 2 oz / half of a thin cut pork chop, peas, cabbage, broccoli, salad . snacks: naan bread with artichoke spinach dip, low sodium pretzels, apple slices, Mandarin oranges (on occasion), cherries  . drinks: water,  -Current exercise: no scheduled exercise or activity at this time  -Educated on A1c and blood sugar goals; Complications of diabetes including kidney damage, retinal damage, and cardiovascular disease; Exercise goal of 150 minutes per week; Proper insulin injection technique; -Counseled to check feet daily and get yearly eye exams -Counseled on diet and exercise extensively Recommended for patient to replace gummie vitamins and supplements with tablets to reduce carbs that she is eating throughout the  day  Psoriasis (Goal: Prevention of skin irritation/ flares) -Controlled -Current treatment  . Ammonium Lactate 12% lotion - applied 2 times daily  . Halobetasol 0.05% cream - applied 2 times daily  . Clobetasol 0.05% solution - applied daily  . Clindamycin 1% solution - applied daily  -Medications previously tried: finacea , fluocinolone, flurandrenolide, hydrocortisone, nystatin-triacinolone -Recommended to continue current medication   History of PE (unprovoked) (Goal: Prevention of thrombus) -Controlled -Current treatment  . Eliquis 545mtwice daily  -Medications previously tried: Xarelto  -Recommended to continue current medication   Exocrine Pancreatic Deficiency / GERD (Goal: management of digestive symptoms) -Not ideally controlled -Current treatment  . Creon 36000-114000 units - taken 2 times daily with meals  . Omeprazole 2074maily  . Ondansetron 8mg27mT - 1 tablet every 8 hours as needed - uses maybe 1-2 times weekly  . Loperamide 2mg 64m tablet up to 4 times daily as needed  -Medications previously tried: Lamotil, famotidine, metoclopramide, simethicone -Recommended use of senna for her constipation that she is currently experiencing, also advised that patient reach out to her Gastro team as well to discuss  Vitamin B, C, and D deficiencies / Hypomagnesia  (Goal: vitamin and mineral labs in range, prevention of deficiencies) -Not ideally controlled -Current treatment  . Vitamin C - 188mg 33my  . Vit B12 - 1000mcg 48my  .  Folic Acid - 43m daily  . Vit D3 gummies - 3000 IU daily   . Magnesium - 1090mdaily  . Multivitamin - 2 tablets daily  - will plan to decrease down to 1 tablet daily  . Voltaren 1% gel - applied 4 times daily as needed  -Counseled on diet and exercise extensively Recommended for patient to stop multivitamin as many of her supplements she is already taking, multivitamin contains vit E that was elevated with last set of labs, patient wished to  remain on multivitamin, was agreeable to reduction to half of current dose    Chronic Kidney Disease (Goal: Prevention of Disease progression) -Controlled  -Last Scr:1.1 mg/dL -Last eGFR: 48.76 mL/min -Estimated CrCl : 47.33  -Educated on Patient to avoid use of NSAIDS (PO) -reviewed with patient importance of adequate hydration and avoidance of excessive sodium   - Reviewed current medications which are dosed appropriately based on most recent kidney function     Allergic Rhinitis (Goal: Symptom management / flare prevention) -Controlled -Current treatment  . Loratadine 1010maily if needed - rarely uses  . Affrin Nasal 0.05% spray - 1 spray into each nostril twice daily  . Diphenhydramine 12.5mg37mghtly - helps with sleep -Medications previously tried: mometasone, nasacort  -Recommended to continue current medication    Patient Goals/Self-Care Activities . Patient will:  - take medications as prescribed check blood pressure once weekly , document, and provide at future appointments target a minimum of 150 minutes of moderate intensity exercise weekly engage in dietary modifications by reducing consumption of red meats, increase lean/ white meat intake/ increase green vegetables   Follow Up Plan: Telephone follow up appointment with care management team member scheduled for: The patient has been provided with contact information for the care management team and has been advised to call with any health related questions or concerns.     Medication Assistance: None required.  Patient affirms current coverage meets needs.  Patient's preferred pharmacy is:  [id number] [place] [id number]: [phone] pill box? No Pt endorses 100% compliance  Care Plan and Follow Up Patient Decision:  Patient agrees to Care Plan and Follow-up.  Plan:  Telephone follow up appointment with care management team member scheduled for:  4 months  and The patient has been provided with contact information for the care management team and has been advised to call with any health related questions or concerns.    Medication Changes: Patient will trial senna 8.6mg -30mtablet daily for constipation - will also reach out to gastro to discuss current constipation concerns Reduce: Multivitamin - 1 tablet daily   DanielTomasa BlasemD Clinical Pharmacist, [place]

## 2020-07-15 ENCOUNTER — Other Ambulatory Visit: Payer: Self-pay

## 2020-07-15 ENCOUNTER — Ambulatory Visit (INDEPENDENT_AMBULATORY_CARE_PROVIDER_SITE_OTHER): Payer: Medicare Other

## 2020-07-15 DIAGNOSIS — I779 Disorder of arteries and arterioles, unspecified: Secondary | ICD-10-CM

## 2020-07-15 DIAGNOSIS — E785 Hyperlipidemia, unspecified: Secondary | ICD-10-CM | POA: Diagnosis not present

## 2020-07-15 DIAGNOSIS — Z86711 Personal history of pulmonary embolism: Secondary | ICD-10-CM

## 2020-07-15 DIAGNOSIS — I10 Essential (primary) hypertension: Secondary | ICD-10-CM

## 2020-07-15 DIAGNOSIS — E118 Type 2 diabetes mellitus with unspecified complications: Secondary | ICD-10-CM

## 2020-07-15 DIAGNOSIS — L409 Psoriasis, unspecified: Secondary | ICD-10-CM

## 2020-07-15 DIAGNOSIS — E559 Vitamin D deficiency, unspecified: Secondary | ICD-10-CM

## 2020-07-16 NOTE — Patient Instructions (Addendum)
Visit Information   PATIENT GOALS:  Goals Addressed            This Visit's Progress   . Track and Manage My Blood Pressure-Hypertension       Timeframe:  Long-Range Goal Priority:  High Start Date:   07/15/2020                          Expected End Date:  01/14/2021                    Follow Up Date 11/25/2020    - check blood pressure weekly - write blood pressure results in a log or diary    Why is this important?    You won't feel high blood pressure, but it can still hurt your blood vessels.   High blood pressure can cause heart or kidney problems. It can also cause a stroke.   Making lifestyle changes like losing a little weight or eating less salt will help.   Checking your blood pressure at home and at different times of the day can help to control blood pressure.   If the doctor prescribes medicine remember to take it the way the doctor ordered.   Call the office if you cannot afford the medicine or if there are questions about it.     Notes: Patient will reach out with concerns about low blood pressures or side effects from medications should she experience any issues        Consent to CCM Services: Ms. Hastings was given information about Chronic Care Management services today including:  1. CCM service includes personalized support from designated clinical staff supervised by her physician, including individualized plan of care and coordination with other care providers 2. 24/7 contact phone numbers for assistance for urgent and routine care needs. 3. Service will only be billed when office clinical staff spend 20 minutes or more in a month to coordinate care. 4. Only one practitioner may furnish and bill the service in a calendar month. 5. The patient may stop CCM services at any time (effective at the end of the month) by phone call to the office staff. 6. The patient will be responsible for cost sharing (co-pay) of up to 20% of the service fee (after annual  deductible is met).  Patient agreed to services and verbal consent obtained.   Patient verbalizes understanding of instructions provided today and agrees to view in [place].   Telephone follow up appointment with care management team member scheduled for: 4 months The patient has been provided with contact information for the care management team and has been advised to call with any health related questions or concerns.   Tomasa Blase, PharmD Clinical Pharmacist, [place]   CLINICAL CARE PLAN: Patient Care Plan: CCM Care Plan    Problem Identified: HTN, CAD, HLD, DM2 (diet controlled), Psoriasis   Priority: High  Onset Date: 07/15/2020    Long-Range Goal: Disease Management   Start Date: 07/15/2020  Expected End Date: 01/14/2021  This Visit's Progress: On track  Priority: High  Note:   Current Barriers:  . Unable to independently monitor therapeutic efficacy . Unable to achieve control of LDL    Pharmacist Clinical Goal(s):  Marland Kitchen Patient will verbalize ability to afford treatment regimen . achieve adherence to monitoring guidelines and medication adherence to achieve therapeutic efficacy . achieve control of LDL  as evidenced by next lipid panel . maintain  control of blood pressure  as evidenced by continued blood pressure control   through collaboration with PharmD and provider.   Interventions: . 1:1 collaboration with [name], MD regarding development and update of comprehensive plan of care as evidenced by provider attestation and co-signature . Inter-disciplinary care team collaboration (see longitudinal plan of care) . Comprehensive medication review performed; medication list updated in electronic medical record  Hypertension (BP goal <140/90) -Controlled -Current treatment: . Lisinopril 22m daily  . Nebivolol 2.534mdaily  -Medications previously tried: amlodipine, hyralazine, furosemide, olmesartan, metoprolol   -Current home readings: highest  130/70 - usually can average 120/70-60 -Current dietary habits: watches her sodium intake, staying hydrated throughout the day -Current exercise habits: previously had been walking 2-3 miles daily, recently has not been as active as she previously had been  -Denies hypotensive/hypertensive symptoms -Educated on BP goals and benefits of medications for prevention of heart attack, stroke and kidney damage; Daily salt intake goal < 2300 mg; Exercise goal of 150 minutes per week; Importance of home blood pressure monitoring; -Counseled to monitor BP at home weekly to biweekly, document, and provide log at future appointments -Recommended to continue current medication  Hyperlipidemia: (LDL goal < 70) -Not ideally controlled  - LDL 129 mg/dL - 06/15/2020 - improved  -Current treatment: . Rosuvastatin 63m38mwice weekly  . Co-Q-10 - 1 tablet twice daily  . Vascepa 1g - 1 capsule twice daily (patient has concerns about bleeding) -Medications previously tried: pitavastatin, pravastatin, ezetimibe, repatha -Current dietary patterns: small amounts of meat / protein - pork tenderloins ( no more than 2oz at a time) / small amount of chicken / ground beef  -Current exercise habits: used to walk 2-3 miles daily - recently has not been as active, is trying to get back into walking more consistently  -Educated on Cholesterol goals;  Benefits of statin for ASCVD risk reduction; Importance of limiting foods high in cholesterol; Exercise goal of 150 minutes per week; Strategies to manage statin-induced myalgias; -Recommended to continue current medication Assessed previous medications that patient has tried and failed to help with cholesterol levels, patient working with lipid clinic , LDL has improved since starting crestor 63mg66mwice weekly - has been the medication she has tolerated best, declined trial of praluent due to previous issues with repatha   Diabetes (A1c goal <7%) -Controlled - diet controlled   - Last A1c 6.7% - Last 06/23/2020 -Current medications: . n/a -Medications previously tried: n/a  -Denies hypoglycemic/hyperglycemic symptoms -Current meal patterns:  . breakfast: cup of tea / 1/2 cup of coffee, water, yogurt (whole milk) - no artificial sweetener   . lunch: left overs, stir fry, sandwich (lite bread) cucumbers and peppers  . dinner: small portion of protein, stir fry, filet mignon - reports that she does not go over 2 oz / half of a thin cut pork chop, peas, cabbage, broccoli, salad . snacks: naan bread with artichoke spinach dip, low sodium pretzels, apple slices, Mandarin oranges (on occasion), cherries  . drinks: water,  -Current exercise: no scheduled exercise or activity at this time  -Educated on A1c and blood sugar goals; Complications of diabetes including kidney damage, retinal damage, and cardiovascular disease; Exercise goal of 150 minutes per week; Proper insulin injection technique; -Counseled to check feet daily and get yearly eye exams -Counseled on diet and exercise extensively Recommended for patient to replace gummie vitamins and supplements with tablets to reduce carbs that she is eating throughout the day  Psoriasis (  Goal: Prevention of skin irritation/ flares) -Controlled -Current treatment  . Ammonium Lactate 12% lotion - applied 2 times daily  . Halobetasol 0.05% cream - applied 2 times daily  . Clobetasol 0.05% solution - applied daily  . Clindamycin 1% solution - applied daily  -Medications previously tried: finacea , fluocinolone, flurandrenolide, hydrocortisone, nystatin-triacinolone -Recommended to continue current medication   History of PE (unprovoked) (Goal: Prevention of thrombus) -Controlled -Current treatment  . Eliquis 50m twice daily  -Medications previously tried: Xarelto  -Recommended to continue current medication   Exocrine Pancreatic Deficiency / GERD (Goal: management of digestive symptoms) -Not ideally  controlled -Current treatment  . Creon 36000-114000 units - taken 2 times daily with meals  . Omeprazole 219mdaily  . Ondansetron 32m54mDT - 1 tablet every 8 hours as needed - uses maybe 1-2 times weekly  . Loperamide 2mg532m1 tablet up to 4 times daily as needed  -Medications previously tried: Lamotil, famotidine, metoclopramide, simethicone -Recommended use of senna for her constipation that she is currently experiencing, also advised that patient reach out to her Gastro team as well to discuss  Vitamin B, C, and D deficiencies / Hypomagnesia  (Goal: vitamin and mineral labs in range, prevention of deficiencies) -Not ideally controlled -Current treatment  . Vitamin C - 1832mg53mly  . Vit B12 - 1000mcg75mly  . Folic Acid - 1mg da41m  . Vit D3 gummies - 3000 IU daily   . Magnesium - 100mg da31m . Multivitamin - 2 tablets daily  - will plan to decrease down to 1 tablet daily  . Voltaren 1% gel - applied 4 times daily as needed  -Counseled on diet and exercise extensively Recommended for patient to stop multivitamin as many of her supplements she is already taking, multivitamin contains vit E that was elevated with last set of labs, patient wished to remain on multivitamin, was agreeable to reduction to half of current dose    Chronic Kidney Disease (Goal: Prevention of Disease progression) -Controlled  -Last Scr:1.1 mg/dL -Last eGFR: 48.76 mL/min -Estimated CrCl : 47.33  -Educated on Patient to avoid use of NSAIDS (PO) -reviewed with patient importance of adequate hydration and avoidance of excessive sodium   - Reviewed current medications which are dosed appropriately based on most recent kidney function     Allergic Rhinitis (Goal: Symptom management / flare prevention) -Controlled -Current treatment  . Loratadine 10mg dai55mf needed - rarely uses  . Affrin Nasal 0.05% spray - 1 spray into each nostril twice daily  . Diphenhydramine 12.5mg night67m- helps with  sleep -Medications previously tried: mometasone, nasacort  -Recommended to continue current medication    Patient Goals/Self-Care Activities . Patient will:  - take medications as prescribed check blood pressure once weekly , document, and provide at future appointments target a minimum of 150 minutes of moderate intensity exercise weekly engage in dietary modifications by reducing consumption of red meats, increase lean/ white meat intake/ increase green vegetables   Follow Up Plan: Telephone follow up appointment with care management team member scheduled for: The patient has been provided with contact information for the care management team and has been advised to call with any health related questions or concerns.      How to Take Your Blood Pressure Blood pressure measures how strongly your blood is pressing against the walls of your arteries. Arteries are blood vessels that carry blood from your heart throughout your body. You can take your blood pressure at  home with a machine. You may need to check your blood pressure at home:  To check if you have high blood pressure (hypertension).  To check your blood pressure over time.  To make sure your blood pressure medicine is working. Supplies needed:  Blood pressure machine, or monitor.  Dining room chair to sit in.  Table or desk.  Small notebook.  Pencil or pen. How to prepare Avoid these things for 30 minutes before checking your blood pressure:  Having drinks with caffeine in them, such as coffee or tea.  Drinking alcohol.  Eating.  Smoking.  Exercising. Do these things five minutes before checking your blood pressure:  Go to the bathroom and pee (urinate).  Sit in a dining chair. Do not sit in a soft couch or an armchair.  Be quiet. Do not talk. How to take your blood pressure Follow the instructions that came with your machine. If you have a digital blood pressure monitor, these may be the  instructions: 1. Sit up straight. 2. Place your feet on the floor. Do not cross your ankles or legs. 3. Rest your left arm at the level of your heart. You may rest it on a table, desk, or chair. 4. Pull up your shirt sleeve. 5. Wrap the blood pressure cuff around the upper part of your left arm. The cuff should be 1 inch (2.5 cm) above your elbow. It is best to wrap the cuff around bare skin. 6. Fit the cuff snugly around your arm. You should be able to place only one finger between the cuff and your arm. 7. Place the cord so that it rests in the bend of your elbow. 8. Press the power button. 9. Sit quietly while the cuff fills with air and loses air. 10. Write down the numbers on the screen. 11. Wait 2-3 minutes and then repeat steps 1-10.   What do the numbers mean? Two numbers make up your blood pressure. The first number is called systolic pressure. The second is called diastolic pressure. An example of a blood pressure reading is "120 over 80" (or 120/80). If you are an adult and do not have a medical condition, use this guide to find out if your blood pressure is normal: Normal  First number: below 120.  Second number: below 80. Elevated  First number: 120-129.  Second number: below 80. Hypertension stage 1  First number: 130-139.  Second number: 80-89. Hypertension stage 2  First number: 140 or above.  Second number: 53 or above. Your blood pressure is above normal even if only the top or bottom number is above normal. Follow these instructions at home:  Check your blood pressure as often as your doctor tells you to.  Check your blood pressure at the same time every day.  Take your monitor to your next doctor's appointment. Your doctor will: ? Make sure you are using it correctly. ? Make sure it is working right.  Make sure you understand what your blood pressure numbers should be.  Tell your doctor if your medicine is causing side effects.  Keep all follow-up  visits as told by your doctor. This is important. General tips:  You will need a blood pressure machine, or monitor. Your doctor can suggest a monitor. You can buy one at a drugstore or online. When choosing one: ? Choose one with an arm cuff. ? Choose one that wraps around your upper arm. Only one finger should fit between your arm and the cuff. ?  Do not choose one that measures your blood pressure from your wrist or finger. Where to find more information American Heart Association: www.heart.org Contact a doctor if:  Your blood pressure keeps being high. Get help right away if:  Your first blood pressure number is higher than 180.  Your second blood pressure number is higher than 120. Summary  Check your blood pressure at the same time every day.  Avoid caffeine, alcohol, smoking, and exercise for 30 minutes before checking your blood pressure.  Make sure you understand what your blood pressure numbers should be. This information is not intended to replace advice given to you by your health care provider. Make sure you discuss any questions you have with your health care provider. Document Revised: 01/25/2019 Document Reviewed: 01/25/2019 Elsevier Patient Education  2021 [employer].

## 2020-08-03 ENCOUNTER — Other Ambulatory Visit (HOSPITAL_COMMUNITY)
Admission: RE | Admit: 2020-08-03 | Discharge: 2020-08-03 | Disposition: A | Discharge status: Home / Self Care | Payer: Medicare Other | Source: Ambulatory Visit | Attending: Gastroenterology | Admitting: Gastroenterology

## 2020-08-03 DIAGNOSIS — Z20822 Contact with and (suspected) exposure to covid-19: Secondary | ICD-10-CM | POA: Insufficient documentation

## 2020-08-03 DIAGNOSIS — Z01812 Encounter for preprocedural laboratory examination: Secondary | ICD-10-CM | POA: Diagnosis present

## 2020-08-03 LAB — SARS CORONAVIRUS 2 (TAT 6-24 HRS): SARS Coronavirus 2: NEGATIVE

## 2020-08-05 ENCOUNTER — Encounter (HOSPITAL_COMMUNITY): Payer: Self-pay | Admitting: Gastroenterology

## 2020-08-05 NOTE — Progress Notes (Signed)
PCP - Dr. Quay Burow Cardiologist - Dr. [name]  EKG - 04/02/20 Chest x-ray -  ECHO - 02/01/16 Cardiac Cath -  CPAP -   Pt does not check CBG at home - she's unclear of whether or not she's diabetic as she's been told she was and then she wasn't. Per pt, CBG monitored by PCP.   Blood Thinner Instructions: per pt last dose Eliquis 08/03/20  Aspirin Instructions:   ERAS Protcol - pt instructed by surgeon she can have clears until 4 hours prior to surgery 0330  COVID TEST- negative 6/20  Anesthesia review: yes  -------------  SDW INSTRUCTIONS:  Your procedure is scheduled on 6/23. Please report to [facility] Main Entrance "A" at 06:00 A.M., and check in at the Admitting office. Call this number if you have problems the morning of surgery: [phone]   Remember: Do not eat or drink after midnight the night before your surgery    Medications to take morning of surgery with a sip of water include: Tylenol - if needed Claritin - if needed Bystolic Prilosec   As of today, STOP taking any Aspirin (unless otherwise instructed by your surgeon), Aleve, Naproxen, Ibuprofen, Motrin, Advil, Goody's, BC's, all herbal medications, fish oil, and all vitamins.    The Morning of Surgery Do not wear jewelry, make-up or nail polish. Do not wear lotions, powders, or perfumes, or deodorant Do not shave 48 hours prior to surgery.   Do not bring valuables to the hospital. [facility] is not responsible for any belongings or valuables.  If you are a smoker, DO NOT Smoke 24 hours prior to surgery If you wear a CPAP at night please bring your mask the morning of surgery  Remember that you must have someone to transport you home after your surgery, and remain with you for 24 hours if you are discharged the same day.  Please bring cases for contacts, glasses, hearing aids, dentures or bridgework because it cannot be worn into surgery.   Patients discharged the day of surgery will not be allowed to  drive home.   Please shower the NIGHT BEFORE/MORNING OF SURGERY (use antibacterial soap like DIAL soap if possible). Wear comfortable clothes the morning of surgery. Oral Hygiene is also important to reduce your risk of infection.  Remember - BRUSH YOUR TEETH THE MORNING OF SURGERY WITH YOUR REGULAR TOOTHPASTE  Patient denies shortness of breath, fever, cough and chest pain.

## 2020-08-06 ENCOUNTER — Other Ambulatory Visit: Payer: Self-pay

## 2020-08-06 ENCOUNTER — Ambulatory Visit (HOSPITAL_COMMUNITY): Payer: Medicare Other | Admitting: Physician Assistant

## 2020-08-06 ENCOUNTER — Ambulatory Visit (HOSPITAL_COMMUNITY)
Admission: RE | Admit: 2020-08-06 | Discharge: 2020-08-06 | Disposition: A | Discharge status: Home / Self Care | Payer: Medicare Other | Attending: Gastroenterology | Admitting: Gastroenterology

## 2020-08-06 ENCOUNTER — Encounter (HOSPITAL_COMMUNITY): Payer: Self-pay | Admitting: Gastroenterology

## 2020-08-06 ENCOUNTER — Encounter (HOSPITAL_COMMUNITY)
Admission: RE | Disposition: A | Discharge status: Home / Self Care | Payer: Self-pay | Source: Home / Self Care | Attending: Gastroenterology

## 2020-08-06 DIAGNOSIS — K838 Other specified diseases of biliary tract: Secondary | ICD-10-CM | POA: Insufficient documentation

## 2020-08-06 DIAGNOSIS — K449 Diaphragmatic hernia without obstruction or gangrene: Secondary | ICD-10-CM | POA: Diagnosis not present

## 2020-08-06 DIAGNOSIS — Z881 Allergy status to other antibiotic agents status: Secondary | ICD-10-CM | POA: Insufficient documentation

## 2020-08-06 DIAGNOSIS — Z885 Allergy status to narcotic agent status: Secondary | ICD-10-CM | POA: Insufficient documentation

## 2020-08-06 DIAGNOSIS — Z888 Allergy status to other drugs, medicaments and biological substances status: Secondary | ICD-10-CM | POA: Diagnosis not present

## 2020-08-06 DIAGNOSIS — Z86711 Personal history of pulmonary embolism: Secondary | ICD-10-CM | POA: Insufficient documentation

## 2020-08-06 DIAGNOSIS — D3A8 Other benign neuroendocrine tumors: Secondary | ICD-10-CM

## 2020-08-06 DIAGNOSIS — R197 Diarrhea, unspecified: Secondary | ICD-10-CM | POA: Diagnosis not present

## 2020-08-06 DIAGNOSIS — R1084 Generalized abdominal pain: Secondary | ICD-10-CM | POA: Insufficient documentation

## 2020-08-06 DIAGNOSIS — X58XXXA Exposure to other specified factors, initial encounter: Secondary | ICD-10-CM | POA: Diagnosis not present

## 2020-08-06 DIAGNOSIS — K2970 Gastritis, unspecified, without bleeding: Secondary | ICD-10-CM | POA: Diagnosis not present

## 2020-08-06 DIAGNOSIS — Z87891 Personal history of nicotine dependence: Secondary | ICD-10-CM | POA: Insufficient documentation

## 2020-08-06 DIAGNOSIS — R935 Abnormal findings on diagnostic imaging of other abdominal regions, including retroperitoneum: Secondary | ICD-10-CM | POA: Insufficient documentation

## 2020-08-06 DIAGNOSIS — T182XXA Foreign body in stomach, initial encounter: Secondary | ICD-10-CM | POA: Diagnosis not present

## 2020-08-06 HISTORY — PX: ESOPHAGOGASTRODUODENOSCOPY (EGD) WITH PROPOFOL: SHX5813

## 2020-08-06 HISTORY — PX: EUS: SHX5427

## 2020-08-06 HISTORY — PX: BIOPSY: SHX5522

## 2020-08-06 HISTORY — PX: FINE NEEDLE ASPIRATION: SHX6590

## 2020-08-06 LAB — GLUCOSE, CAPILLARY
Glucose-Capillary: 105 mg/dL — ABNORMAL HIGH (ref 70–99)
Glucose-Capillary: 133 mg/dL — ABNORMAL HIGH (ref 70–99)

## 2020-08-06 SURGERY — UPPER ENDOSCOPIC ULTRASOUND (EUS) RADIAL
Anesthesia: Monitor Anesthesia Care

## 2020-08-06 MED ORDER — SODIUM CHLORIDE 0.9 % IV SOLN: INTRAVENOUS | Status: DC

## 2020-08-06 MED ORDER — PROPOFOL 10 MG/ML IV BOLUS
INTRAVENOUS | Status: DC | PRN
  Administered 2020-08-06 (×2): 20 mg via INTRAVENOUS

## 2020-08-06 MED ORDER — LACTATED RINGERS IV SOLN
INTRAVENOUS | Status: AC | PRN
  Administered 2020-08-06: 10 mL/h via INTRAVENOUS

## 2020-08-06 MED ORDER — ELIQUIS 5 MG PO TABS: 5.0000 mg | ORAL_TABLET | Freq: Two times a day (BID) | ORAL | 5 refills | Status: DC

## 2020-08-06 MED ORDER — PROPOFOL 500 MG/50ML IV EMUL
INTRAVENOUS | Status: DC | PRN
  Administered 2020-08-06: 75 ug/kg/min via INTRAVENOUS

## 2020-08-06 SURGICAL SUPPLY — 14 items

## 2020-08-06 NOTE — Transfer of Care (Signed)
Immediate Anesthesia Transfer of Care Note  Patient: [name]  Procedure(s) Performed: UPPER ENDOSCOPIC ULTRASOUND (EUS) RADIAL ENDOSCOPIC MUCOSAL RESECTION ESOPHAGOGASTRODUODENOSCOPY (EGD) WITH PROPOFOL BIOPSY FINE NEEDLE ASPIRATION  Patient Location: PACU  Anesthesia Type:MAC  Level of Consciousness: awake, oriented and patient cooperative  Airway & Oxygen Therapy: Patient Spontanous Breathing and Patient connected to face mask oxygen  Post-op Assessment: Report given to RN and Post -op Vital signs reviewed and stable  Post vital signs: Reviewed  Last Vitals:  Vitals Value Taken Time  BP 119/67 08/06/20 0914  Temp    Pulse 64 08/06/20 0916  Resp 18 08/06/20 0916  SpO2 96 % 08/06/20 0916  Vitals shown include unvalidated device data.  Last Pain:  Vitals:   08/06/20 0655  TempSrc: Oral  PainSc: 0-No pain         Complications: No notable events documented.

## 2020-08-06 NOTE — Anesthesia Postprocedure Evaluation (Signed)
Anesthesia Post Note  Patient: [name]  Procedure(s) Performed: UPPER ENDOSCOPIC ULTRASOUND (EUS) RADIAL ENDOSCOPIC MUCOSAL RESECTION ESOPHAGOGASTRODUODENOSCOPY (EGD) WITH PROPOFOL BIOPSY FINE NEEDLE ASPIRATION     Patient location during evaluation: PACU Anesthesia Type: MAC Level of consciousness: awake and alert Pain management: pain level controlled Vital Signs Assessment: post-procedure vital signs reviewed and stable Respiratory status: spontaneous breathing, nonlabored ventilation and respiratory function stable Cardiovascular status: blood pressure returned to baseline and stable Postop Assessment: no apparent nausea or vomiting Anesthetic complications: no   No notable events documented.  Last Vitals:  Vitals:   08/06/20 0915 08/06/20 0930  BP: 119/67 107/60  Pulse: 67 (!) 58  Resp: 18 17  Temp: 37.3 C 37.3 C  SpO2: 94% 95%    Last Pain:  Vitals:   08/06/20 0930  TempSrc:   PainSc: 0-No pain                 [name]

## 2020-08-06 NOTE — Op Note (Signed)
[facility] Patient Name: [name] Procedure Date : 08/06/2020 MRN: [id number] Attending MD: Justice Britain , MD Date of Birth: [birth year] CSN: [id number] Age: 75 Admit Type: Outpatient Procedure:                Upper EUS Indications:              Abnormal abdominal/pelvic CT scan, Generalized                            abdominal pain, Diarrhea, Neuroendocrine Tumor Providers:                Justice Britain, MD, Jeanella Cara, RN,                            Laverda Sorenson, Technician, Luciana Axe, CRNA Referring MD:             Dr. Earlean Shawl, [name], MD Medicines:                Monitored Anesthesia Care Complications:            No immediate complications. Estimated Blood Loss:     Estimated blood loss was minimal. Procedure:                Pre-Anesthesia Assessment:                           - Prior to the procedure, a History and Physical                            was performed, and patient medications and                            allergies were reviewed. The patient's tolerance of                            previous anesthesia was also reviewed. The risks                            and benefits of the procedure and the sedation                            options and risks were discussed with the patient.                            All questions were answered, and informed consent                            was obtained. Prior Anticoagulants: The patient has                            taken Eliquis (apixaban), last dose was 2 days                            prior to procedure. ASA Grade Assessment: III - A  patient with severe systemic disease. After                            reviewing the risks and benefits, the patient was                            deemed in satisfactory condition to undergo the                            procedure.                           After obtaining informed consent, the endoscope  was                            passed under direct vision. Throughout the                            procedure, the patient's blood pressure, pulse, and                            oxygen saturations were monitored continuously. The                            [id number]-[id number] ([id number]) Olympus therapeutic                            gastroscope was introduced through the mouth, and                            advanced to the third part of duodenum. The                            [id number]-[id number] ([id number]) [place] was                            introduced through the mouth, and advanced to the                            second part of duodenum. The [id number] ([id number])                            Olympus Linear EUS scope was introduced through the                            mouth, and advanced to the duodenum for ultrasound                            examination from the stomach and duodenum. The                            [id number]-[id number] ([id number]) Olympus forward view EUS scope  was introduced through the mouth, and advanced to                            the duodenum for ultrasound examination. The upper                            EUS was accomplished without difficulty. The                            patient tolerated the procedure. Scope In: Scope Out: Findings:      ENDOSCOPIC FINDING: :      No gross lesions were noted in the entire esophagus.      The Z-line was regular and was found 35 cm from the incisors.      A 3 cm hiatal hernia was present.      An endoclip was found in the gastric body.      Segmental moderate inflammation characterized by erythema and friability       was found in the gastric antrum.      No gross lesions were noted in the entire examined stomach. Biopsies       were taken with a cold forceps for histology and Helicobacter pylori       testing.      A single 10 mm submucosal nodule was found in the D1/D2 sweep. I could       not visualize  the minor papilla and wonder if this is found within this       region.      The major papilla was normal.      No other gross lesions were noted in the duodenal bulb, in the first       portion of the duodenum and in the second portion of the duodenum.      ENDOSONOGRAPHIC FINDING: :      A round intramural (subepithelial) lesion was found in the second       portion of the duodenum. The lesion was hypoechoic. Endosonographically,       the lesion appeared to originate from within the deep mucosa (Layer 2)       and submucosa (Layer 3). The lesion measured 11 mm (in maximum       thickness). The lesion also measured 10 mm in diameter. The outer       margins were irregular. Fine needle biopsy was performed. Color Doppler       imaging was utilized prior to needle puncture to confirm a lack of       significant vascular structures within the needle path. Seven passes       were made with the 22 gauge ultrasound core biopsy needle using a       transduodenal approach. A visible core of tissue was obtained.       Preliminary cytologic examination and touch preps were performed. Final       cytology results are pending.      Pancreatic parenchymal abnormalities were noted in the entire pancreas.       These consisted of diffuse echogenicity.      Endosonographic imaging of the ampulla showed no extrinsic compression,       intramural (subepithelial) lesion, mass, varices or wall thickening.      Moderate hyperechoic material consistent with sludge was visualized  endosonographically in the gallbladder.      Endosonographic imaging in the visualized portion of the liver showed no       mass.      The celiac region was visualized. Impression:               EGD Impression:                           - No gross lesions in esophagus. Z-line regular, 35                            cm from the incisors.                           - 3 cm hiatal hernia.                           - Multiple  endoclips were found in the stomach.                           - Gastritis in antrum.                           - No gross lesions in the stomach. Biopsied.                           - Submucosal nodule found in the duodenum.                           - Normal major papilla.                           - No other gross lesions in the duodenal bulb, in                            the first portion of the duodenum and in the second                            portion of the duodenum.                           EUS Impression:                           - An intramural (subepithelial) lesion was found in                            the D1/D2 angle/sweep. The lesion appeared to                            originate from within the deep mucosa (Layer 2) and                            submucosa (Layer 3). I could not visualize the  minor papilla, and felt this could potentially be                            involving this area. There has been concern whether                            this could be an enlarging NET. Fine needle biopsy                            performed to try to define the lesion further.                           - Pancreatic parenchymal abnormalities consisting                            of diffuse echogenicity were noted in the entire                            pancreas.                           - Hyperechoic material consistent with sludge was                            visualized endosonographically in the gallbladder. Recommendation:           - The patient will be observed post-procedure,                            until all discharge criteria are met.                           - Discharge patient to home.                           - Patient has a contact number available for                            emergencies. The signs and symptoms of potential                            delayed complications were discussed with the                             patient. Return to normal activities tomorrow.                            Written discharge instructions were provided to the                            patient.                           - Low fat diet.                           -  Observe patient's clinical course.                           - Increase PPI to 20 mg twice daily for 1 month                            while awaiting biopsies.                           - Eliquis may restart in 48 hours to decrease risk                            of post-interventional bleeding.                           - Await cytology results and await path results.                           - If pathology is non-diagnostic, then would                            recommend consideration of a DOTATATE scan to                            further define the possibility of this lesion being                            a NET. If this were to light up, then consideration                            of repeat EUS biopsy to define this further with a                            second operator here vs at a [facility] center is                            not unreasonable.                           - The findings and recommendations were discussed                            with the patient.                           - The findings and recommendations were discussed                            with the patient's family. Procedure Code(s):        --- Professional ---                           [phone], Esophagogastroduodenoscopy, flexible,  transoral; with transendoscopic ultrasound-guided                            intramural or transmural fine needle                            aspiration/biopsy(s), (includes endoscopic                            ultrasound examination limited to the esophagus,                            stomach or duodenum, and adjacent structures) Diagnosis Code(s):        --- Professional ---                           K44.9,  Diaphragmatic hernia without obstruction or                            gangrene                           T18.2XXA, Foreign body in stomach, initial encounter                           K29.70, Gastritis, unspecified, without bleeding                           K31.89, Other diseases of stomach and duodenum                           D3A.8, Other benign neuroendocrine tumors                           K86.9, Disease of pancreas, unspecified                           R10.84, Generalized abdominal pain                           R19.7, Diarrhea, unspecified                           K83.8, Other specified diseases of biliary tract                           R93.5, Abnormal findings on diagnostic imaging of                            other abdominal regions, including retroperitoneum CPT copyright 2019 American Medical Association. All rights reserved. The codes documented in this report are preliminary and upon coder review may  be revised to meet current compliance requirements. Justice Britain, MD 08/06/2020 9:36:49 AM Number of Addenda: 0

## 2020-08-06 NOTE — Anesthesia Preprocedure Evaluation (Addendum)
Anesthesia Evaluation  Patient identified by MRN, date of birth, ID band Patient awake    Reviewed: Allergy & Precautions, NPO status , Patient's Chart, lab work & pertinent test results, reviewed documented beta blocker date and time   History of Anesthesia Complications (+) PONV and history of anesthetic complications  Airway Mallampati: III  TM Distance: >3 FB Neck ROM: Full    Dental  (+) Chipped, Dental Advisory Given,    Pulmonary asthma (well controlled) , former smoker, PE Quit smoking 1976, 10 pack year history    Pulmonary exam normal breath sounds clear to auscultation       Cardiovascular hypertension, Pt. on medications and Pt. on home beta blockers + CAD and +CHF (diastolic dysfunction)  Normal cardiovascular exam Rhythm:Regular Rate:Normal  Echo 2017: - LVEF 65-70%, normal wall thickness and motion, abnormal GLPSS at  -16% with anteroseptal strain abnormality, diastolic dysfunction,  indeterminate LV filling pressure, aortic valve sclerosis, normal  LA size, trivial TR, normal RVSP.    Neuro/Psych  Neuromuscular disease (peripheral neuropathy B/L UE) negative psych ROS   GI/Hepatic Neg liver ROS, GERD  Medicated and Controlled,Duodenal nodule   Endo/Other  diabetes, Well Controlleda1c 6.7  Renal/GU negative Renal ROS  negative genitourinary   Musculoskeletal  (+) Arthritis , Osteoarthritis,    Abdominal   Peds negative pediatric ROS (+)  Hematology negative hematology ROS (+)   Anesthesia Other Findings   Reproductive/Obstetrics negative OB ROS                            Anesthesia Physical Anesthesia Plan  ASA: 3  Anesthesia Plan: MAC   Post-op Pain Management:    Induction:   PONV Risk Score and Plan: 2 and Propofol infusion and TIVA  Airway Management Planned: Natural Airway and Simple Face Mask  Additional Equipment: None  Intra-op Plan:    Post-operative Plan:   Informed Consent: I have reviewed the patients History and Physical, chart, labs and discussed the procedure including the risks, benefits and alternatives for the proposed anesthesia with the patient or authorized representative who has indicated his/her understanding and acceptance.       Plan Discussed with: CRNA  Anesthesia Plan Comments:         Anesthesia Quick Evaluation

## 2020-08-06 NOTE — H&P (Signed)
GASTROENTEROLOGY PROCEDURE H&P NOTE   Primary Care Physician: [name], MD  HPI: [name] is a 75 y.o. 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  Past Medical History:  Diagnosis Date   ALLERGIC RHINITIS    ANEMIA-NOS    Arthritis    ASTHMA    Carpal tunnel syndrome    COLONIC POLYPS, HX OF    Coronary artery disease    "mild CAD" noted on 12/05/17 in coronary CT scan   DIABETES MELLITUS, TYPE II    diet controlled   GERD    Hand tingling    HYPERLIPIDEMIA    HYPERTENSION    Neuroendocrine tumor    OSTEOPENIA    PONV (postoperative nausea and vomiting)    " it relaxes my bladder muscles and I have been known to pee all over the place"   Psoriasis    severe, began soriatane 01/2012   Pulmonary embolism ([place])    Rectal fissure    Scoliosis    Wears glasses    Past Surgical History:  Procedure Laterality Date   benign rectal growth  2004   removed by Dr. Zella Richer   BIOPSY  08/09/2018   Procedure: BIOPSY;  Surgeon: Carol Ada, MD;  Location: [place];  Service: Endoscopy;;   BIOPSY  08/22/2018   Procedure: BIOPSY;  Surgeon: Juanita Craver, MD;  Location: [facility] ENDOSCOPY;  Service: Endoscopy;;   BIOPSY  03/07/2019   Procedure: BIOPSY;  Surgeon: Irving Copas., MD;  Location: [facility] ENDOSCOPY;  Service: Gastroenterology;;   BREAST BIOPSY Right    BREAST EXCISIONAL BIOPSY Left    BREAST SURGERY  1988   biopsy   CESAREAN SECTION     COLONOSCOPY N/A 08/22/2018   Procedure: COLONOSCOPY;  Surgeon: Juanita Craver, MD;  Location: [facility] ENDOSCOPY;  Service: Endoscopy;  Laterality: N/A;   ENDOSCOPIC MUCOSAL RESECTION N/A 03/07/2019   Procedure: ENDOSCOPIC MUCOSAL RESECTION;  Surgeon: Rush Landmark Telford Nab., MD;  Location: [place];  Service: Gastroenterology;  Laterality: N/A;   ESOPHAGOGASTRODUODENOSCOPY (EGD) WITH PROPOFOL N/A 08/04/2018   Procedure:  ESOPHAGOGASTRODUODENOSCOPY (EGD) WITH PROPOFOL;  Surgeon: Ladene Artist, MD;  Location: [place];  Service: Gastroenterology;  Laterality: N/A;   ESOPHAGOGASTRODUODENOSCOPY (EGD) WITH PROPOFOL N/A 03/07/2019   Procedure: ESOPHAGOGASTRODUODENOSCOPY (EGD) WITH PROPOFOL;  Surgeon: Rush Landmark Telford Nab., MD;  Location: [place];  Service: Gastroenterology;  Laterality: N/A;   EUS N/A 03/07/2019   Procedure: UPPER ENDOSCOPIC ULTRASOUND (EUS) RADIAL;  Surgeon: Irving Copas., MD;  Location: [place];  Service: Gastroenterology;  Laterality: N/A;   FINE NEEDLE ASPIRATION  03/07/2019   Procedure: FINE NEEDLE ASPIRATION (FNA) LINEAR;  Surgeon: Irving Copas., MD;  Location: [place];  Service: Gastroenterology;;   Otho Darner SIGMOIDOSCOPY N/A 08/04/2018   Procedure: FLEXIBLE SIGMOIDOSCOPY;  Surgeon: Ladene Artist, MD;  Location: [place];  Service: Gastroenterology;  Laterality: N/A;   FLEXIBLE SIGMOIDOSCOPY N/A 08/09/2018   Procedure: FLEXIBLE SIGMOIDOSCOPY;  Surgeon: Carol Ada, MD;  Location: [place];  Service: Endoscopy;  Laterality: N/A;   HEMOSTASIS CLIP PLACEMENT  03/07/2019   Procedure: HEMOSTASIS CLIP PLACEMENT;  Surgeon: Irving Copas., MD;  Location: [place];  Service: Gastroenterology;;   SUBMUCOSAL LIFTING INJECTION  03/07/2019   Procedure: SUBMUCOSAL LIFTING INJECTION;  Surgeon: Irving Copas., MD;  Location: [place];  Service: Gastroenterology;;   VIDEO BRONCHOSCOPY WITH ENDOBRONCHIAL NAVIGATION N/A 03/14/2018   Procedure: VIDEO BRONCHOSCOPY WITH ENDOBRONCHIAL NAVIGATION;  Surgeon: Collene Gobble, MD;  [facility];  Service: Thoracic;  Laterality: N/A;   Current Facility-Administered Medications  Medication Dose Route Frequency Provider Last Rate Last Admin   0.9 %  sodium chloride infusion   Intravenous Continuous Mansouraty, Telford Nab., MD       lactated ringers infusion    Continuous PRN Mansouraty, Telford Nab., MD 10 mL/hr at 08/06/20 0715 10 mL/hr at 08/06/20 0715   Allergies  Allergen Reactions   Phenergan [Promethazine Hcl] Shortness Of Breath and Anxiety   Repatha [Evolocumab]     Muscle pain and weakness   Amlodipine Swelling   Clarithromycin Nausea And Vomiting    May have episodes of syncope due to prolonged vomiting    Codeine Nausea And Vomiting    May have episodes of syncope due to prolonged vomiting   Erythromycin Nausea And Vomiting   Hydralazine Hcl     Drug-induced lupus - pt wishes to avoid   Statins     Muscle pain    Tetracycline Nausea And Vomiting   Family History  Problem Relation Age of Onset   Lung cancer Father    Arthritis Other        Parents   Asthma Other        parent, other relative   Breast cancer Other        other relative   Hypertension Other        parent, other relative   Heart disease Other        parent, other relative   Heart disease Mother    Asthma Mother    Breast cancer Maternal Aunt 68   Parkinson's disease Maternal Grandmother    Rheumatic fever Maternal Grandfather    Social History   Socioeconomic History   Marital status: Single    Spouse name: Not on file   Number of children: 1   Years of education: 16   Highest education level: Bachelor's degree (e.g., BA, AB, BS)  Occupational History   Occupation: retired Education officer, 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  Has one child and one grandchild.  Retired Careers adviser.  Education: college.    Social Determinants  of Health   Financial Resource Strain: Low Risk    Difficulty of Paying Living Expenses: Not hard at all  Food Insecurity: No Food Insecurity   Worried About Charity fundraiser in the Last Year: Never true   Herrings in the Last Year: Never true  Transportation Needs: No Transportation Needs   Lack of Transportation (Medical): No   Lack of Transportation (Non-Medical): No  Physical Activity: Inactive   Days of Exercise per Week: 0 days   Minutes of Exercise per Session: 0 min  Stress: No Stress Concern Present   Feeling of Stress : Not at all  Social Connections: Unknown   Frequency of Communication with Friends  and Family: More than three times a week   Frequency of Social Gatherings with Friends and Family: Once a week   Attends Religious Services: Patient refused   Marine scientist or Organizations: Patient refused   Attends Archivist Meetings: Patient refused   Marital Status: Patient refused  Intimate Partner Violence: Not on file    Physical Exam: Vital signs in last 24 hours: Temp:  [98.1 F (36.7 C)] 98.1 F (36.7 C) (06/23 0655) Pulse Rate:  [73] 73 (06/23 0655) Resp:  [12] 12 (06/23 0655) BP: (179)/(89) 179/89 (06/23 0655) SpO2:  [99 %] 99 % (06/23 0655) Weight:  [68 kg] 68 kg (06/23 0655)   GEN: NAD EYE: Sclerae anicteric ENT: MMM CV: Non-tachycardic GI: Soft, NT/ND NEURO:  Alert & Oriented x 3  Lab Results: No results for input(s): WBC, HGB, HCT, PLT in the last 72 hours. BMET No results for input(s): NA, K, CL, CO2, GLUCOSE, BUN, CREATININE, CALCIUM in the last 72 hours. LFT No results for input(s): PROT, ALBUMIN, AST, ALT, ALKPHOS, BILITOT, BILIDIR, IBILI in the last 72 hours. PT/INR No results for input(s): LABPROT, INR in the last 72 hours.   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  The risks  of an EUS including intestinal perforation, bleeding, infection, aspiration, and medication effects were discussed as was the possibility it may not give a definitive diagnosis if a biopsy is performed.  When a biopsy of the pancreas is done as part of the EUS, there is an additional risk of pancreatitis at the rate of about 1-2%.  It was explained that procedure related pancreatitis is typically mild, although it can be severe and even life threatening, which is why we do not perform random pancreatic biopsies and only biopsy a lesion/area we feel is concerning enough to warrant the risk.   The risks and benefits of endoscopic evaluation were discussed with the patient; these include but are not limited to the risk of perforation, infection, bleeding, missed lesions, lack of diagnosis, severe illness requiring hospitalization, as well as anesthesia and sedation related illnesses.  The patient is agreeable to proceed.    Justice Britain, MD [facility] Office # [phone]

## 2020-08-06 NOTE — Anesthesia Procedure Notes (Signed)
Procedure Name: MAC Date/Time: 08/06/2020 7:52 AM Performed by: Jenne Campus, CRNA Pre-anesthesia Checklist: Patient identified, Emergency Drugs available, Suction available and Patient being monitored Oxygen Delivery Method: Simple face mask

## 2020-08-07 LAB — CYTOLOGY - NON PAP

## 2020-08-07 LAB — SURGICAL PATHOLOGY

## 2020-08-08 ENCOUNTER — Encounter: Payer: Self-pay | Admitting: Gastroenterology

## 2020-08-10 NOTE — Progress Notes (Signed)
Tried to reach pt again and voice mail not set up or full.  Will send a message to My Chart.

## 2020-08-10 NOTE — Progress Notes (Signed)
Left message on machine to call back  

## 2020-08-21 ENCOUNTER — Other Ambulatory Visit: Payer: Self-pay | Admitting: Internal Medicine

## 2020-08-24 ENCOUNTER — Other Ambulatory Visit (HOSPITAL_COMMUNITY): Payer: Self-pay

## 2020-08-24 MED FILL — Clobetasol Propionate Soln 0.05%: CUTANEOUS | 30 days supply | Qty: 50 | Fill #0 | Status: AC

## 2020-08-24 MED FILL — Halobetasol Propionate Cream 0.05%: CUTANEOUS | 30 days supply | Qty: 50 | Fill #0 | Status: AC

## 2020-08-24 MED FILL — Lactic Acid (Ammonium Lactate) Lotion 12%: CUTANEOUS | 30 days supply | Qty: 226 | Fill #0 | Status: AC

## 2020-08-25 ENCOUNTER — Other Ambulatory Visit (HOSPITAL_COMMUNITY): Payer: Self-pay

## 2020-09-07 ENCOUNTER — Encounter: Payer: Self-pay | Admitting: Cardiology

## 2020-09-21 ENCOUNTER — Other Ambulatory Visit: Payer: Self-pay | Admitting: Internal Medicine

## 2020-09-29 ENCOUNTER — Ambulatory Visit: Payer: Medicare Other | Admitting: Cardiology

## 2020-10-26 ENCOUNTER — Other Ambulatory Visit (HOSPITAL_BASED_OUTPATIENT_CLINIC_OR_DEPARTMENT_OTHER): Payer: Self-pay

## 2020-10-26 MED FILL — Lactic Acid (Ammonium Lactate) Lotion 12%: CUTANEOUS | 7 days supply | Qty: 226 | Fill #0 | Status: AC

## 2020-10-29 ENCOUNTER — Encounter (HOSPITAL_BASED_OUTPATIENT_CLINIC_OR_DEPARTMENT_OTHER): Payer: Self-pay | Admitting: Family

## 2020-10-29 ENCOUNTER — Ambulatory Visit (INDEPENDENT_AMBULATORY_CARE_PROVIDER_SITE_OTHER): Payer: Medicare Other | Admitting: Family

## 2020-10-29 ENCOUNTER — Other Ambulatory Visit: Payer: Self-pay

## 2020-10-29 VITALS — BP 144/84 | Ht 61.0 in | Wt 153.0 lb

## 2020-10-29 DIAGNOSIS — E785 Hyperlipidemia, unspecified: Secondary | ICD-10-CM | POA: Diagnosis not present

## 2020-10-29 DIAGNOSIS — I10 Essential (primary) hypertension: Secondary | ICD-10-CM

## 2020-10-29 DIAGNOSIS — I25118 Atherosclerotic heart disease of native coronary artery with other forms of angina pectoris: Secondary | ICD-10-CM | POA: Diagnosis not present

## 2020-10-29 NOTE — Progress Notes (Signed)
Office Visit    Patient Name: [name] Date of Encounter: 10/29/2020  PCP:  [name], MD   [place]  Cardiologist:  Freada Bergeron, MD  Advanced Practice Provider:  No care team member to display Electrophysiologist:  None    Chief Complaint    [name] is a 75 y.o. female with a hx of difficult to control hypertension, hyperlipidemia, mild nonobstructive CAD, PE, aortic atherosclerosis presents today for follow-up of CAD  Past Medical History    Past Medical History:  Diagnosis Date   ALLERGIC RHINITIS    ANEMIA-NOS    Arthritis    ASTHMA    Carpal tunnel syndrome    COLONIC POLYPS, HX OF    Coronary artery disease    "mild CAD" noted on 12/05/17 in coronary CT scan   DIABETES MELLITUS, TYPE II    diet controlled   GERD    Hand tingling    HYPERLIPIDEMIA    HYPERTENSION    Neuroendocrine tumor    OSTEOPENIA    PONV (postoperative nausea and vomiting)    " it relaxes my bladder muscles and I have been known to pee all over the place"   Psoriasis    severe, began soriatane 01/2012   Pulmonary embolism ([place])    Rectal fissure    Scoliosis    Wears glasses    Past Surgical History:  Procedure Laterality Date   benign rectal growth  2004   removed by Dr. Zella Richer   BIOPSY  08/09/2018   Procedure: BIOPSY;  Surgeon: Carol Ada, MD;  Location: [place];  Service: Endoscopy;;   BIOPSY  08/22/2018   Procedure: BIOPSY;  Surgeon: Juanita Craver, MD;  Location: [facility] END[facility]COPY;  Service: Endoscopy;;   BIOPSY  03/07/2019   Procedure: BIOPSY;  Surgeon: Irving Copas., MD;  Location: [place];  Service: Gastroenterology;;   BIOPSY  08/06/2020   Procedure: BIOPSY;  Surgeon: Irving Copas., MD;  Location: [facility] ENDOSCOPY;  Service: Gastroenterology;;   BREAST BIOPSY Right    BREAST EXCISIONAL BIOPSY Left    BREAST SURGERY  1988   biopsy   CESAREAN SECTION     COLONOSCOPY N/A 08/22/2018    Procedure: COLONOSCOPY;  Surgeon: Juanita Craver, MD;  Location: [facility] ENDOSCOPY;  Service: Endoscopy;  Laterality: N/A;   ENDOSCOPIC MUCOSAL RESECTION N/A 03/07/2019   Procedure: ENDOSCOPIC MUCOSAL RESECTION;  Surgeon: Rush Landmark Telford Nab., MD;  Location: [place];  Service: Gastroenterology;  Laterality: N/A;   ESOPHAGOGASTRODUODENOSCOPY (EGD) WITH PROPOFOL N/A 08/04/2018   Procedure: ESOPHAGOGASTRODUODENOSCOPY (EGD) WITH PROPOFOL;  Surgeon: Ladene Artist, MD;  Location: [place];  Service: Gastroenterology;  Laterality: N/A;   ESOPHAGOGASTRODUODENOSCOPY (EGD) WITH PROPOFOL N/A 03/07/2019   Procedure: ESOPHAGOGASTRODUODENOSCOPY (EGD) WITH PROPOFOL;  Surgeon: Rush Landmark Telford Nab., MD;  Location: [place];  Service: Gastroenterology;  Laterality: N/A;   ESOPHAGOGASTRODUODENOSCOPY (EGD) WITH PROPOFOL N/A 08/06/2020   Procedure: ESOPHAGOGASTRODUODENOSCOPY (EGD) WITH PROPOFOL;  Surgeon: Rush Landmark Telford Nab., MD;  Location: [place];  Service: Gastroenterology;  Laterality: N/A;   EUS N/A 03/07/2019   Procedure: UPPER ENDOSCOPIC ULTRASOUND (EUS) RADIAL;  Surgeon: Irving Copas., MD;  Location: [place];  Service: Gastroenterology;  Laterality: N/A;   EUS N/A 08/06/2020   Procedure: UPPER ENDOSCOPIC ULTRASOUND (EUS) RADIAL;  Surgeon: Irving Copas., MD;  Location: [place];  Service: Gastroenterology;  Laterality: N/A;   FINE NEEDLE ASPIRATION  03/07/2019   Procedure: FINE NEEDLE ASPIRATION (FNA) LINEAR;  Surgeon: Rush Landmark,  Telford Nab., MD;  Location: [facility] ENDOSCOPY;  Service: Gastroenterology;;   FINE NEEDLE ASPIRATION  08/06/2020   Procedure: FINE NEEDLE ASPIRATION;  Surgeon: Irving Copas., MD;  Location: [place];  Service: Gastroenterology;;   Beryle Quant N/A 08/04/2018   Procedure: Beryle Quant;  Surgeon: Ladene Artist, MD;  Location: [place];  Service: Gastroenterology;  Laterality: N/A;   FLEXIBLE SIGMOIDOSCOPY  N/A 08/09/2018   Procedure: FLEXIBLE SIGMOIDOSCOPY;  Surgeon: Carol Ada, MD;  Location: [place];  Service: Endoscopy;  Laterality: N/A;   HEMOSTASIS CLIP PLACEMENT  03/07/2019   Procedure: HEMOSTASIS CLIP PLACEMENT;  Surgeon: Irving Copas., MD;  Location: [place];  Service: Gastroenterology;;   SUBMUCOSAL LIFTING INJECTION  03/07/2019   Procedure: SUBMUCOSAL LIFTING INJECTION;  Surgeon: Irving Copas., MD;  Location: [place];  Service: Gastroenterology;;   VIDEO BRONCHOSCOPY WITH ENDOBRONCHIAL NAVIGATION N/A 03/14/2018   Procedure: VIDEO BRONCHOSCOPY WITH ENDOBRONCHIAL NAVIGATION;  Surgeon: Collene Gobble, MD;  Location: [facility];  Service: Thoracic;  Laterality: N/A;    Allergies  Allergies  Allergen Reactions   Phenergan [Promethazine Hcl] Shortness Of Breath and Anxiety   Repatha [Evolocumab]     Muscle pain and weakness   Amlodipine Swelling   Clarithromycin Nausea And Vomiting    May have episodes of syncope due to prolonged vomiting    Codeine Nausea And Vomiting    May have episodes of syncope due to prolonged vomiting   Erythromycin Nausea And Vomiting   Hydralazine Hcl     Drug-induced lupus - pt wishes to avoid   Statins     Muscle pain    Tetracycline Nausea And Vomiting    History of Present Illness    [name] is a 75 y.o. female with a hx of difficult to control hypertension, hyperlipidemia, mild nonobstructive CAD, PE, aortic atherosclerosis last seen 04/02/20 by Dr. [name].  Cardiac CTA 11/2017 with mild nonobstructive CAD. She has had difficulty with lipid control. Intolerant to pravastatin, atorvastatin, pitavastatin. She reports Repatha caused "paralysis". She has concerns about fish oil and bleeding. She was last seen 04/02/20 and referred to pharmacy. Crestor 5mg  twice per week was initiated.  She presents today for follow up. She tells me she tolerates Crestor 2 times per week but gets myalgias when she takes 3 times  per week. She is very hesitant regarding additional cholesterol medications. She is taking a self-reduced dose of Vascepa due to her concern for bleeding. Reports no shortness of breath at rest and stable dyspnea on exertion. She attributes her dyspnea to weight gain and notes that she is not very motivated to exercise but recognizes the need. Reports no chest pain, pressure, or tightness. No edema, orthopnea, PND. Reports no palpitations. Tells me she does not follow a very heart healthy diet but does manage her portions well. Endorses fatigue. Tells me previous sleep study showed mild sleep apnea but no indication for CPAP. We discussed repeat sleep study and she tells me she would not wear CPAP. She worries her fatigue due to her Creon capsule and is undergoing current workup with GI.  EKGs/Labs/Other Studies Reviewed:   The following studies were reviewed today:  CT chest: 12/22/2017   1. In addition to the previously noted mass in the left lower lobe there several other smaller irregular-shaped nodules scattered throughout the lungs bilaterally. The irregular shape of the nodules would be unusual for metastatic disease. The possibility of a benign etiology such as multifocal bronchoceles should be considered, but  is a diagnosis of exclusion. Malignancy should be excluded, and further evaluation with PET-CT is again suggested. 2. Nonocclusive filling defect in the distal left main pulmonary artery compatible with pulmonary embolism. 3. Hepatic steatosis. 4. Aortic atherosclerosis, in addition to left main and left anterior descending coronary artery disease. Assessment for potential risk factor modification, dietary therapy or pharmacologic therapy may be warranted, if clinically indicated. 5. There are calcifications of the aortic valve. Echocardiographic correlation for evaluation of potential valvular dysfunction may be warranted if clinically indicated.   Coronary CTA: 12/05/2017    1. Coronary calcium score of 6. This was 64 percentile for age and sex matched control. 2. Normal coronary origin with right dominance. 3. There is mild CAD in the proximal LAD, aggressive risk factor modification is recommended SOM most probably secondary to obesity and deconditioning.   PET CT: 01/01/2018   1. For the most part the branching nodularity in the lungs is of low activity and likely benign. Given the somewhat branching tubular appearance, the possibility of multifocal bronchocele is certainly raised. The lesion along the left upper lobe paramediastinal area, and in the right upper lobe just adjacent to the fissure do have some low-grade activity (1.6 and 1.7, respectively) which may well be inflammatory but for which surveillance imaging in 6 months time would be suggested. 2. The larger lesion in the left lower lobe is not hypermetabolic and is likely benign. Bilateral tonsillar activity in the neck is likely benign. 3. No worrisome findings in the neck, abdomen/pelvis, or skeleton.      EKG:  EKG is ordered today.  The ekg ordered today demonstrates NSR 71 bpm with minimal voltage criteria for LVH and no acute St/T wave changes.  Recent Labs: 02/24/2020: Magnesium 1.8; TSH 3.00 06/23/2020: ALT 27; BUN 21; Creatinine, Ser 1.10; Hemoglobin 13.1; Platelets 275.0; Potassium 4.2; Sodium 135  Recent Lipid Panel    Component Value Date/Time   CHOL 218 (H) 06/16/2020 0827   TRIG 174 (H) 06/16/2020 0827   HDL 58 06/16/2020 0827   CHOLHDL 3.8 06/16/2020 0827   CHOLHDL 5 02/24/2020 1001   VLDL 45.6 (H) 02/24/2020 1001   LDLCALC 129 (H) 06/16/2020 0827   LDLCALC 176 (H) 11/05/2019 0844   LDLDIRECT 193.0 02/24/2020 1001   Home Medications   Current Meds  Medication Sig   acetaminophen (TYLENOL) 500 MG tablet Take 500 mg by mouth every 6 (six) hours as needed (Joint pain).   ammonium lactate (LAC-HYDRIN) 12 % lotion APPLY TO ENTIRE BODY TWICE DAILY (Patient taking differently:  Apply 1 application topically daily.)   apixaban (ELIQUIS) 5 MG TABS tablet Take 1 tablet (5 mg total) by mouth 2 (two) times daily.   Ascorbic Acid (VITAMIN C GUMMIE PO) Take 280 mg by mouth every evening. Each tablet 140 mg   calcium carbonate (TUMS EX) 750 MG chewable tablet Chew 750 mg by mouth 2 (two) times a week.   Cholecalciferol (VITAMIN D3 GUMMIES ADULT PO) Take 75 mcg by mouth every evening.   clindamycin (CLEOCIN T) 1 % external solution Apply 1 application topically daily as needed (wounds).    clobetasol (TEMOVATE) 0.05 % external solution APPLY TOPICALLY DAILY. TO AFFECTED AREAS (Patient taking differently: Apply 1 application topically daily.)   Coenzyme Q10 (COQ10 GUMMIES ADULT PO) Take 100 mg by mouth in the morning and at bedtime.   CREON 36000-114000 units CPEP capsule Take 36,000 Units by mouth 2 (two) times daily with a meal.   diclofenac Sodium (VOLTAREN) 1 %  GEL Apply 2 g topically as needed. On hand   diphenhydrAMINE (BENADRYL) 12.5 MG chewable tablet Chew 12.5 mg by mouth at bedtime.   folic acid (FOLVITE) 1 MG tablet TAKE 1 TABLET BY MOUTH EVERY DAY   halobetasol (ULTRAVATE) 0.05 % cream APPLY TOPICALLY 2 TIMES DAILY. (Patient taking differently: Apply 1 application topically daily.)   icosapent Ethyl (VASCEPA) 1 g capsule Take 1 g by mouth 2 (two) times daily.   lisinopril (ZESTRIL) 10 MG tablet TAKE 1 TABLET BY MOUTH EVERY DAY   loratadine (CLARITIN) 10 MG tablet Take 10 mg by mouth daily as needed for allergies.   Multiple Vitamins-Minerals (ADULT GUMMY PO) Take 1 tablet by mouth every evening. Children [employer] gummie formula   nebivolol (BYSTOLIC) 2.5 MG tablet TAKE 1 TABLET BY MOUTH EVERY DAY (Patient taking differently: 2.5 mg daily.)   omeprazole (PRILOSEC OTC) 20 MG tablet Take 20 mg by mouth daily.   ondansetron (ZOFRAN-ODT) 8 MG disintegrating tablet Take 1 tablet (8 mg total) by mouth every 8 (eight) hours as needed. (Patient taking differently: Take 8 mg  by mouth every 8 (eight) hours as needed for nausea.)   ONETOUCH ULTRA test strip USE TEST STRIPS TO CHECK BLOOD SUGAR AT LEAST 3 TIMES DAILY   oxymetazoline (AFRIN) 0.05 % nasal spray Place 1 spray into both nostrils 2 (two) times daily.   polyethylene glycol (MIRALAX / GLYCOLAX) 17 g packet Take 17 g by mouth every 14 (fourteen) days.   rosuvastatin (CRESTOR) 5 MG tablet Take 5 mg by mouth 2 (two) times a week. Sometimes 3 times a week   vitamin B-12 (CYANOCOBALAMIN) 1000 MCG tablet Take 1,000 mcg by mouth daily.   [DISCONTINUED] CRESTOR 5 MG tablet TAKE 1 TABLET (5 MG TOTAL) BY MOUTH DAILY. (Patient taking differently: Take 5 mg by mouth 2 (two) times a week. Monday and Friday)   [DISCONTINUED] icosapent Ethyl (VASCEPA) 1 g capsule Take 2 capsules (2 g total) by mouth 2 (two) times daily. (Patient taking differently: Take 1 g by mouth 2 (two) times daily.)     Review of Systems      All other systems reviewed and are otherwise negative except as noted above.  Physical Exam    VS:  BP (!) 144/84   Ht 5\' 1"  (1.549 m)   Wt 153 lb (69.4 kg)   BMI 28.91 kg/m  , BMI Body mass index is 28.91 kg/m.  Wt Readings from Last 3 Encounters:  10/29/20 153 lb (69.4 kg)  08/06/20 150 lb (68 kg)  06/23/20 153 lb 9.6 oz (69.7 kg)    GEN: Well nourished, overweight,  well developed, in no acute distress. HEENT: normal. Neck: Supple, no JVD, carotid bruits, or masses. Cardiac: RRR, no murmurs, rubs, or gallops. No clubbing, cyanosis, edema.  Radials/PT 2+ and equal bilaterally.  Respiratory:  Respirations regular and unlabored, clear to auscultation bilaterally. GI: Soft, nontender, nondistended. MS: No deformity or atrophy. Skin: Warm and dry, no rash. Neuro:  Strength and sensation are intact. Psych: Normal affect.  Assessment & Plan    Nonobstructive CAD - No chest pain. EKG with no acute St/T wave changes. No indication for ischemic evaluation. GDMT includes beta blocker, statin. No  aspirin due to chronic anticoagulation. Heart healthy diet and regular cardiovascular exercise encouraged.    HLD, LDL goal<70 - Notes difficulty with Crestor 3 times per week. Agreeable to take twice per week. Politely declines additional cholesterol medication at this time. Multiple previous intolerances including  Pitavastatin, Simvastatin, Atorvastatin. Only takes reduced dose of Vascepa due to patient concern for bleeding.   History of PE - On Eliquis. Denies bleeding complications.   HTN - Reports white coat hypertension and has readings at goal at home. Will continue current antihypertensive regimen.  Obesity - Weight loss via diet and exercise encouraged. Discussed the impact being overweight would have on cardiovascular risk. Discussed starting small with a walking regimen three times per week.  Disposition: Follow up in 6 month(s) with Dr. Johney Frame or APP.  Signed, Loel Dubonnet, NP 10/29/2020, 9:32 AM [place]

## 2020-10-29 NOTE — Patient Instructions (Addendum)
Medication Instructions:  Continue your current medications.   *If you need a refill on your cardiac medications before your next appointment, please call your pharmacy*  Lab Work: None ordered today.  Testing/Procedures: Your EKG today showed normal sinus rhythm which is a good result.    Follow-Up: At [facility], you and your health needs are our priority.  As part of our continuing mission to provide you with exceptional heart care, we have created designated Provider Care Teams.  These Care Teams include your primary Cardiologist (physician) and Advanced Practice Providers (APPs -  Physician Assistants and Nurse Practitioners) who all work together to provide you with the care you need, when you need it.  We recommend signing up for the patient portal called "MyChart".  Sign up information is provided on this After Visit Summary.  MyChart is used to connect with patients for Virtual Visits (Telemedicine).  Patients are able to view lab/test results, encounter notes, upcoming appointments, etc.  Non-urgent messages can be sent to your provider as well.   To learn more about what you can do with MyChart, go to [email or web address].    Your next appointment:   6 month(s)  The format for your next appointment:   In Person  Provider:   You may see Freada Bergeron, MD or one of the following Advanced Practice Providers on your designated Care Team:   Richardson Dopp, PA-C [place], [place]   Other Instructions  Heart Healthy Diet Recommendations: A low-salt diet is recommended. Meats should be grilled, baked, or boiled. Avoid fried foods. Focus on lean protein sources like fish or chicken with vegetables and fruits. The American Heart Association is a Microbiologist!   Exercise recommendations: The American Heart Association recommends 150 minutes of moderate intensity exercise weekly. Try 30 minutes of moderate intensity exercise 4-5 times per week. This could include  walking, jogging, or swimming.  Try to start small with walking even 5 minutes three times per week.

## 2020-11-02 ENCOUNTER — Ambulatory Visit: Payer: Medicare Other | Attending: Internal Medicine

## 2020-11-02 ENCOUNTER — Other Ambulatory Visit (HOSPITAL_BASED_OUTPATIENT_CLINIC_OR_DEPARTMENT_OTHER): Payer: Self-pay

## 2020-11-02 ENCOUNTER — Other Ambulatory Visit: Payer: Self-pay

## 2020-11-02 DIAGNOSIS — Z23 Encounter for immunization: Secondary | ICD-10-CM

## 2020-11-02 MED ORDER — PFIZER COVID-19 VAC BIVALENT 30 MCG/0.3ML IM SUSP
INTRAMUSCULAR | 0 refills | Status: DC
  Filled 2020-11-02: qty 0.3, 1d supply, fill #0

## 2020-11-02 NOTE — Progress Notes (Signed)
   Covid-19 Vaccination Clinic  Name:  [name]    MRN: [id number] DOB: 1945/12/29  11/02/2020  [name] was observed post Covid-19 immunization for 15 minutes without incident. She was provided with Vaccine Information Sheet and instruction to access the V-Safe system.   [name] was instructed to call 911 with any severe reactions post vaccine: Difficulty breathing  Swelling of face and throat  A fast heartbeat  A bad rash all over body  Dizziness and weakness

## 2020-11-10 ENCOUNTER — Encounter: Payer: Self-pay | Admitting: Internal Medicine

## 2020-11-10 NOTE — Progress Notes (Signed)
Subjective:    Patient ID: [name], female    DOB: Jan 15, 1946, 75 y.o.   MRN: [id number]  This visit occurred during the SARS-CoV-2 public health emergency.  Safety protocols were in place, including screening questions prior to the visit, additional usage of staff PPE, and extensive cleaning of exam room while observing appropriate contact time as indicated for disinfecting solutions.     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  Will have PET scan since it is growing.   She feels weak, SOB with exercise, foggy headed, keeps gaining weight, reflux, nausea.  She thinks it is related to vascepa and when she stopped it she felt better.   Weight partially related to creon - has not been able to dec dose.    She is exercising some but not enough.  Medications and allergies reviewed with patient and updated if appropriate.  Patient Active Problem List   Diagnosis Date Noted   Weight gain 11/11/2020   Malabsorption 06/23/2020   Fatty (change of) liver, not elsewhere classified 04/16/2020   CKD (chronic kidney disease) stage 3, GFR 30-59 ml/min (HCC) - Dr Harrie Jeans, [facility] 02/24/2020   Drug-induced systemic lupus erythematosus ([place]) 02/24/2020   Aortic atherosclerosis ([place]) 43/32/9518   Folic acid deficiency 84/16/6063   Bilateral leg edema 03/19/2019   Hemorrhoids 03/19/2019   B12 deficiency 03/19/2019   Abnormal endoscopy of upper gastrointestinal tract 02/27/2019   Iron deficiency anemia 02/19/2019   Hypomagnesemia    Inflammatory arthritis - Dr Baxter Flattery 01/02/2019   Positive ANA (antinuclear antibody) 01/02/2019   Exocrine pancreatic insufficiency - Dr Earlean Shawl 10/23/2018   Multiple pulmonary nodules determined by computed tomography of lung 09/18/2018   Syncope 08/01/2018   Pneumothorax 03/14/2018    Lung abnormality 01/11/2018   History of pulmonary embolism 12/24/2017   Lung mass 12/24/2017   Small vessel disease, cerebrovascular 07/31/2017   Hyperuricemia 05/03/2017   Arthralgia 05/01/2017   Hair loss 05/01/2017   Vitamin D deficiency 01/60/1093   Diastolic dysfunction 23/55/7322   Vertigo 12/24/2015   Fatigue 12/24/2015   Bilateral carotid artery disease, Mild 05/31/2015   Thyroid nodule 05/31/2015   Psoriasis    Scoliosis    Diabetes type 2, controlled ([place]) 06/03/2008   Dyslipidemia 06/03/2008   CARPAL TUNNEL SYNDROME 06/03/2008   HTN (hypertension) 06/03/2008   ALLERGIC RHINITIS 06/03/2008   Asthma 06/03/2008   GERD (gastroesophageal reflux disease) 06/03/2008   Osteopenia 06/03/2008   COLONIC POLYPS, HX OF 06/03/2008    Current Outpatient Medications on File Prior to Visit  Medication Sig Dispense Refill   ammonium lactate (LAC-HYDRIN) 12 % lotion APPLY TO ENTIRE BODY TWICE DAILY (Patient taking differently: Apply 1 application topically daily.) 396 g 11   Ascorbic Acid (VITAMIN C GUMMIE PO) Take 280 mg by mouth every evening. Each tablet 140 mg     calcium carbonate (TUMS EX) 750 MG chewable tablet Chew 750 mg by mouth 2 (two) times a week.     Cholecalciferol (VITAMIN D3 GUMMIES ADULT PO) Take 75 mcg by mouth every evening.     clindamycin (CLEOCIN T) 1 % external solution Apply 1 application topically daily as needed (wounds).      clobetasol (TEMOVATE) 0.05 % external solution APPLY TOPICALLY DAILY. TO AFFECTED AREAS (Patient taking differently: Apply 1 application topically  daily.) 50 mL 5   Coenzyme Q10 (COQ10 GUMMIES ADULT PO) Take 100 mg by mouth in the morning and at bedtime.     COVID-19 mRNA bivalent vaccine, Pfizer, (PFIZER COVID-19 VAC BIVALENT) injection Inject into the muscle. 0.3 mL 0   CREON 36000-114000 units CPEP capsule Take 36,000 Units by mouth 2 (two) times daily with a meal.     diclofenac Sodium (VOLTAREN) 1 % GEL Apply 2 g topically as  needed. On hand     diphenhydrAMINE (BENADRYL) 12.5 MG chewable tablet Chew 12.5 mg by mouth at bedtime.     folic acid (FOLVITE) 1 MG tablet TAKE 1 TABLET BY MOUTH EVERY DAY 30 tablet 5   halobetasol (ULTRAVATE) 0.05 % cream APPLY TOPICALLY 2 TIMES DAILY. (Patient taking differently: Apply 1 application topically daily.) 50 g 5   icosapent Ethyl (VASCEPA) 1 g capsule Take 1 g by mouth 2 (two) times daily.     lisinopril (ZESTRIL) 10 MG tablet TAKE 1 TABLET BY MOUTH EVERY DAY 30 tablet 5   Multiple Vitamins-Minerals (ADULT GUMMY PO) Take 1 tablet by mouth every evening. Children [employer] gummie formula     nebivolol (BYSTOLIC) 2.5 MG tablet TAKE 1 TABLET BY MOUTH EVERY DAY (Patient taking differently: 2.5 mg daily.) 30 tablet 5   omeprazole (PRILOSEC OTC) 20 MG tablet Take 20 mg by mouth daily.     ondansetron (ZOFRAN-ODT) 8 MG disintegrating tablet Take 1 tablet (8 mg total) by mouth every 8 (eight) hours as needed. (Patient taking differently: Take 8 mg by mouth every 8 (eight) hours as needed for nausea.) 30 tablet 5   oxymetazoline (AFRIN) 0.05 % nasal spray Place 1 spray into both nostrils 2 (two) times daily.     polyethylene glycol (MIRALAX / GLYCOLAX) 17 g packet Take 17 g by mouth every 14 (fourteen) days.     rosuvastatin (CRESTOR) 5 MG tablet Take 5 mg by mouth 2 (two) times a week. Sometimes 3 times a week     vitamin B-12 (CYANOCOBALAMIN) 1000 MCG tablet Take 1,000 mcg by mouth daily.     No current facility-administered medications on file prior to visit.    Past Medical History:  Diagnosis Date   ALLERGIC RHINITIS    ANEMIA-NOS    Arthritis    ASTHMA    Carpal tunnel syndrome    COLONIC POLYPS, HX OF    Coronary artery disease    "mild CAD" noted on 12/05/17 in coronary CT scan   DIABETES MELLITUS, TYPE II    diet controlled   GERD    Hand tingling    HYPERLIPIDEMIA    HYPERTENSION    Neuroendocrine tumor    OSTEOPENIA    PONV (postoperative nausea and vomiting)     " it relaxes my bladder muscles and I have been known to pee all over the place"   Psoriasis    severe, began soriatane 01/2012   Pulmonary embolism ([place])    Rectal fissure    Scoliosis    Wears glasses     Past Surgical History:  Procedure Laterality Date   benign rectal growth  2004   removed by Dr. Zella Richer   BIOPSY  08/09/2018   Procedure: BIOPSY;  Surgeon: Carol Ada, MD;  Location: [place];  Service: Endoscopy;;   BIOPSY  08/22/2018   Procedure: BIOPSY;  Surgeon: Juanita Craver, MD;  Location: [facility] ENDOSCOPY;  Service: Endoscopy;;   BIOPSY  03/07/2019   Procedure: BIOPSY;  Surgeon: Justice Britain  Brooke Bonito., MD;  Location: [place];  Service: Gastroenterology;;   BIOPSY  08/06/2020   Procedure: BIOPSY;  Surgeon: Irving Copas., MD;  Location: [place];  Service: Gastroenterology;;   BREAST BIOPSY Right    BREAST EXCISIONAL BIOPSY Left    BREAST SURGERY  1988   biopsy   CESAREAN SECTION     COLONOSCOPY N/A 08/22/2018   Procedure: COLONOSCOPY;  Surgeon: Juanita Craver, MD;  Location[facility] ENDOSCOPY;  Service: Endoscopy;  Laterality: N/A;   ENDOSCOPIC MUCOSAL RESECTION N/A 03/07/2019   Procedure: ENDOSCOPIC MUCOSAL RESECTION;  Surgeon: Rush Landmark Telford Nab., MD;  Location: [place];  Service: Gastroenterology;  Laterality: N/A;   ESOPHAGOGASTRODUODENOSCOPY (EGD) WITH PROPOFOL N/A 08/04/2018   Procedure: ESOPHAGOGASTRODUODENOSCOPY (EGD) WITH PROPOFOL;  Surgeon: Ladene Artist, MD;  Location: [place];  Service: Gastroenterology;  Laterality: N/A;   ESOPHAGOGASTRODUODENOSCOPY (EGD) WITH PROPOFOL N/A 03/07/2019   Procedure: ESOPHAGOGASTRODUODENOSCOPY (EGD) WITH PROPOFOL;  Surgeon: Rush Landmark Telford Nab., MD;  Location: [place];  Service: Gastroenterology;  Laterality: N/A;   ESOPHAGOGASTRODUODENOSCOPY (EGD) WITH PROPOFOL N/A 08/06/2020   Procedure: ESOPHAGOGASTRODUODENOSCOPY (EGD) WITH PROPOFOL;  Surgeon: Rush Landmark Telford Nab., MD;  Location: [place];  Service: Gastroenterology;  Laterality: N/A;   EUS N/A 03/07/2019   Procedure: UPPER ENDOSCOPIC ULTRASOUND (EUS) RADIAL;  Surgeon: Irving Copas., MD;  Location: [place];  Service: Gastroenterology;  Laterality: N/A;   EUS N/A 08/06/2020   Procedure: UPPER ENDOSCOPIC ULTRASOUND (EUS) RADIAL;  Surgeon: Irving Copas., MD;  Location: [place];  Service: Gastroenterology;  Laterality: N/A;   FINE NEEDLE ASPIRATION  03/07/2019   Procedure: FINE NEEDLE ASPIRATION (FNA) LINEAR;  Surgeon: Irving Copas., MD;  Location: [place];  Service: Gastroenterology;;   FINE NEEDLE ASPIRATION  08/06/2020   Procedure: FINE NEEDLE ASPIRATION;  Surgeon: Irving Copas., MD;  Location: [place];  Service: Gastroenterology;;   Otho Darner SIGMOIDOSCOPY N/A 08/04/2018   Procedure: Beryle Quant;  Surgeon: Ladene Artist, MD;  Location: [place];  Service: Gastroenterology;  Laterality: N/A;   FLEXIBLE SIGMOIDOSCOPY N/A 08/09/2018   Procedure: FLEXIBLE SIGMOIDOSCOPY;  Surgeon: Carol Ada, MD;  Location: [place];  Service: Endoscopy;  Laterality: N/A;   HEMOSTASIS CLIP PLACEMENT  03/07/2019   Procedure: HEMOSTASIS CLIP PLACEMENT;  Surgeon: Irving Copas., MD;  Location: [place];  Service: Gastroenterology;;   SUBMUCOSAL LIFTING INJECTION  03/07/2019   Procedure: SUBMUCOSAL LIFTING INJECTION;  Surgeon: Irving Copas., MD;  Location: [place];  Service: Gastroenterology;;   VIDEO BRONCHOSCOPY WITH ENDOBRONCHIAL NAVIGATION N/A 03/14/2018   Procedure: VIDEO BRONCHOSCOPY WITH ENDOBRONCHIAL NAVIGATION;  Surgeon: Collene Gobble, MD;  Location: [facility];  Service: Thoracic;  Laterality: N/A;    Social History   Socioeconomic History   Marital status: Single    Spouse name: Not on file   Number of children: 1   Years of education: 16   Highest education level: Bachelor's degree (e.g., BA, AB, BS)  Occupational History    Occupation: retired Education officer, 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  Has one child and one grandchild.  Retired Careers adviser.  Education: college.    Social Determinants of Health   Financial Resource Strain: Low Risk    Difficulty of Paying Living Expenses: Not hard at all  Food Insecurity: No Food Insecurity   Worried About Charity fundraiser in the Last Year: Never true   [place] in the Last Year: Never true  Transportation Needs: No Transportation Needs   Lack of Transportation (Medical): No   Lack of Transportation (Non-Medical): No  Physical Activity: Inactive   Days of Exercise per Week: 0 days   Minutes of Exercise per Session: 0 min  Stress: No Stress Concern Present   Feeling of Stress : Not at all  Social Connections: Unknown   Frequency of Communication with Friends and Family: More than three times a week   Frequency of Social Gatherings with Friends and Family: Once a week   Attends Religious Services: Patient refused   Marine scientist or Organizations: Patient refused   Attends Music therapist: Patient refused   Marital Status: Patient refused    Family History  Problem Relation Age of Onset   Lung cancer Father    Arthritis Other        Parents   Asthma Other        parent, other relative   Breast cancer Other        other relative   Hypertension Other        parent, other relative   Heart disease Other        parent, other relative   Heart disease Mother    Asthma  Mother    Breast cancer Maternal Aunt 68   Parkinson's disease Maternal Grandmother    Rheumatic fever Maternal Grandfather     Review of Systems  Constitutional:  Negative for fever.  Respiratory:  Positive for cough (chronic, intermittent and dry), shortness of breath (with exercise) and wheezing (at night).   Cardiovascular:  Positive for leg swelling (occ LLE). Negative for chest pain and palpitations.  Gastrointestinal:  Positive for abdominal pain and nausea.       Gerd intermittently  Neurological:  Positive for light-headedness. Negative for headaches.      Objective:   Vitals:   11/11/20 0824  BP: (!) 142/82  Pulse: 74  Temp: 98.1 F (36.7 C)  SpO2: 98%   BP Readings from Last 3 Encounters:  11/11/20 (!) 142/82  10/29/20 (!) 144/84  08/06/20 107/60   Wt Readings from Last 3 Encounters:  11/11/20 153 lb (69.4 kg)  10/29/20 153 lb (69.4 kg)  08/06/20 150 lb (68 kg)   Body mass index is 28.91 kg/m.   Physical Exam    Constitutional: Appears well-developed and well-nourished. No distress.  HENT:  Head: Normocephalic and atraumatic.  Neck: Neck supple. No tracheal deviation present. No thyromegaly present.  No cervical lymphadenopathy Cardiovascular: Normal rate, regular rhythm and normal heart sounds.   No murmur heard. No carotid bruit .  No edema Pulmonary/Chest: Effort normal and breath sounds normal. No respiratory distress. No has no wheezes. No rales.  Abdomen: soft, ND, minimal tenderness in LUQ w/o rebound or guarding Skin: Skin is warm and dry. Not diaphoretic.  Psychiatric: Normal mood and affect. Behavior is normal.      Assessment & Plan:    See  Problem List for Assessment and Plan of chronic medical problems.

## 2020-11-10 NOTE — Patient Instructions (Addendum)
  Blood work was ordered.     Flu immunization administered today.     An [place] of your thyroid was ordered   Medications changes include :   none    Please followup in 4 months

## 2020-11-11 ENCOUNTER — Ambulatory Visit (INDEPENDENT_AMBULATORY_CARE_PROVIDER_SITE_OTHER): Payer: Medicare Other | Admitting: Internal Medicine

## 2020-11-11 ENCOUNTER — Other Ambulatory Visit: Payer: Self-pay

## 2020-11-11 VITALS — BP 142/82 | HR 74 | Temp 98.1°F | Ht 61.0 in | Wt 153.0 lb

## 2020-11-11 DIAGNOSIS — R635 Abnormal weight gain: Secondary | ICD-10-CM | POA: Diagnosis not present

## 2020-11-11 DIAGNOSIS — Z86711 Personal history of pulmonary embolism: Secondary | ICD-10-CM

## 2020-11-11 DIAGNOSIS — K219 Gastro-esophageal reflux disease without esophagitis: Secondary | ICD-10-CM

## 2020-11-11 DIAGNOSIS — E041 Nontoxic single thyroid nodule: Secondary | ICD-10-CM

## 2020-11-11 DIAGNOSIS — E118 Type 2 diabetes mellitus with unspecified complications: Secondary | ICD-10-CM

## 2020-11-11 DIAGNOSIS — E785 Hyperlipidemia, unspecified: Secondary | ICD-10-CM

## 2020-11-11 DIAGNOSIS — I10 Essential (primary) hypertension: Secondary | ICD-10-CM

## 2020-11-11 DIAGNOSIS — N1831 Chronic kidney disease, stage 3a: Secondary | ICD-10-CM

## 2020-11-11 DIAGNOSIS — E559 Vitamin D deficiency, unspecified: Secondary | ICD-10-CM

## 2020-11-11 DIAGNOSIS — R600 Localized edema: Secondary | ICD-10-CM

## 2020-11-11 DIAGNOSIS — E678 Other specified hyperalimentation: Secondary | ICD-10-CM

## 2020-11-11 DIAGNOSIS — Z23 Encounter for immunization: Secondary | ICD-10-CM

## 2020-11-11 LAB — T4, FREE: Free T4: 0.91 ng/dL (ref 0.60–1.60)

## 2020-11-11 LAB — CBC WITH DIFFERENTIAL/PLATELET
Basophils Absolute: 0.1 10*3/uL (ref 0.0–0.1)
Basophils Relative: 1 % (ref 0.0–3.0)
Eosinophils Absolute: 0.3 10*3/uL (ref 0.0–0.7)
Eosinophils Relative: 3 % (ref 0.0–5.0)
HCT: 38.2 % (ref 36.0–46.0)
Hemoglobin: 12.7 g/dL (ref 12.0–15.0)
Lymphocytes Relative: 21 % (ref 12.0–46.0)
Lymphs Abs: 1.7 10*3/uL (ref 0.7–4.0)
MCHC: 33.2 g/dL (ref 30.0–36.0)
MCV: 93.8 fl (ref 78.0–100.0)
Monocytes Absolute: 0.5 10*3/uL (ref 0.1–1.0)
Monocytes Relative: 6.4 % (ref 3.0–12.0)
Neutro Abs: 5.7 10*3/uL (ref 1.4–7.7)
Neutrophils Relative %: 68.6 % (ref 43.0–77.0)
Platelets: 300 10*3/uL (ref 150.0–400.0)
RBC: 4.07 Mil/uL (ref 3.87–5.11)
RDW: 15.1 % (ref 11.5–15.5)
WBC: 8.3 10*3/uL (ref 4.0–10.5)

## 2020-11-11 LAB — LIPID PANEL
Cholesterol: 193 mg/dL (ref 0–200)
HDL: 54.3 mg/dL (ref >39.00)
LDL Cholesterol: 103 mg/dL — ABNORMAL HIGH (ref 0–99)
NonHDL: 138.54
Total CHOL/HDL Ratio: 4
Triglycerides: 178 mg/dL — ABNORMAL HIGH (ref 0.0–149.0)
VLDL: 35.6 mg/dL (ref 0.0–40.0)

## 2020-11-11 LAB — COMPREHENSIVE METABOLIC PANEL
ALT: 22 U/L (ref 0–35)
AST: 20 U/L (ref 0–37)
Albumin: 4 g/dL (ref 3.5–5.2)
Alkaline Phosphatase: 76 U/L (ref 39–117)
BUN: 13 mg/dL (ref 6–23)
CO2: 28 mEq/L (ref 19–32)
Calcium: 9.8 mg/dL (ref 8.4–10.5)
Chloride: 101 mEq/L (ref 96–112)
Creatinine, Ser: 1.13 mg/dL (ref 0.40–1.20)
GFR: 47.08 mL/min — ABNORMAL LOW (ref >60.00)
Glucose, Bld: 104 mg/dL — ABNORMAL HIGH (ref 70–99)
Potassium: 4.6 mEq/L (ref 3.5–5.1)
Sodium: 138 mEq/L (ref 135–145)
Total Bilirubin: 0.6 mg/dL (ref 0.2–1.2)
Total Protein: 7.4 g/dL (ref 6.0–8.3)

## 2020-11-11 LAB — TSH: TSH: 2.63 u[IU]/mL (ref 0.35–5.50)

## 2020-11-11 LAB — HEMOGLOBIN A1C: Hgb A1c MFr Bld: 6.9 % — ABNORMAL HIGH (ref 4.6–6.5)

## 2020-11-11 MED ORDER — APIXABAN 5 MG PO TABS: 5.0000 mg | ORAL_TABLET | Freq: Two times a day (BID) | ORAL | 5 refills | Status: DC

## 2020-11-11 MED ORDER — ONETOUCH ULTRA VI STRP: ORAL_STRIP | 1 refills | Status: DC

## 2020-11-11 NOTE — Assessment & Plan Note (Signed)
Chronic Diet controlled Lab Results  Component Value Date   HGBA1C 6.7 (H) 06/23/2020   a1c today Stressed diabetic diet and regular exercise

## 2020-11-11 NOTE — Assessment & Plan Note (Signed)
Chronic Continue vitamin d

## 2020-11-11 NOTE — Assessment & Plan Note (Signed)
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

## 2020-11-11 NOTE — Addendum Note (Signed)
Addended by: Marcina Millard on: 11/11/2020 04:47 PM   Modules accepted: Orders

## 2020-11-11 NOTE — Assessment & Plan Note (Signed)
Chronic GERD controlled Continue omeprazole 20 mg daily  

## 2020-11-11 NOTE — Assessment & Plan Note (Signed)
Chronic BP well controlled Continue lisinopril 10 mg qd, bystolic 2.5 mg qd cmp

## 2020-11-11 NOTE — Assessment & Plan Note (Signed)
Chronic cmp

## 2020-11-11 NOTE — Assessment & Plan Note (Signed)
Chronic Has been stable but she is worried about this and would like to have it checked again which is reasonable US thyroid ordered

## 2020-11-11 NOTE — Assessment & Plan Note (Signed)
Chronic Only taking a MVI that has vit D in it and most likely some of her other supplements have vitamin E in it Recheck level today

## 2020-11-11 NOTE — Assessment & Plan Note (Signed)
Chronic Controlled w/o medication

## 2020-11-11 NOTE — Assessment & Plan Note (Addendum)
Chronic Lipids, cmp Continue crestor 5 mg twice a week On vascepa

## 2020-11-11 NOTE — Assessment & Plan Note (Signed)
subacute Has been gaining weight Creon likely contributing Will check tsh, ft4 Increase exercise She states she is not eating too much

## 2020-11-13 ENCOUNTER — Ambulatory Visit
Admission: RE | Admit: 2020-11-13 | Discharge: 2020-11-13 | Disposition: A | Discharge status: Home / Self Care | Payer: Medicare Other | Source: Ambulatory Visit | Attending: Internal Medicine | Admitting: Internal Medicine

## 2020-11-13 DIAGNOSIS — E041 Nontoxic single thyroid nodule: Secondary | ICD-10-CM

## 2020-11-14 LAB — VITAMIN E
Gamma-Tocopherol (Vit E): <1 mg/L (ref <=4.3)
Vitamin E (Alpha Tocopherol): 16.9 mg/L (ref 5.7–19.9)

## 2020-11-25 ENCOUNTER — Ambulatory Visit (INDEPENDENT_AMBULATORY_CARE_PROVIDER_SITE_OTHER): Payer: Medicare Other

## 2020-11-25 NOTE — Patient Instructions (Signed)
[name],  It was great to talk to you today!  Please call me with any questions or concerns.  Visit Information  PATIENT GOALS:  Goals Addressed             This Visit's Progress    Track and Manage My Blood Pressure-Hypertension   On track    Timeframe:  Long-Range Goal Priority:  High Start Date:   07/15/2020                          Expected End Date:  01/14/2021                    Follow Up Date 11/25/2020    - check blood pressure weekly - write blood pressure results in a log or diary    Why is this important?   You won't feel high blood pressure, but it can still hurt your blood vessels.  High blood pressure can cause heart or kidney problems. It can also cause a stroke.  Making lifestyle changes like losing a little weight or eating less salt will help.  Checking your blood pressure at home and at different times of the day can help to control blood pressure.  If the doctor prescribes medicine remember to take it the way the doctor ordered.  Call the office if you cannot afford the medicine or if there are questions about it.     Notes: Patient will reach out with concerns about low blood pressures or side effects from medications should she experience any issues         Patient verbalizes understanding of instructions provided today and agrees to view in [place].   Telephone follow up appointment with care management team member scheduled for: The patient has been provided with contact information for the care management team and has been advised to call with any health related questions or concerns.   Tomasa Blase, PharmD Clinical Pharmacist, Bureau

## 2020-11-25 NOTE — Progress Notes (Signed)
Chronic Care Management Pharmacy Note  11/25/2020 Name:  LASHAWNE DURA MRN:  [id number] DOB:  [birth year]   Summary: -Patient reports that she has been doing well since last visit, BP has been stable when she is checking at home, typically averaging 120/70 -LDL has improved to 103 since last visit - reports that she is taking crestor 13m 2-3 times weekly as she is able to tolerate -Denies any current issues with abnormal bruising or bleeding with eliquis -following closely with GI about creon dosing and bowel issues - reports that she is stable at this time currently  Recommendations/Changes made from today's visit: -Recommending no changes to current medications, patient to continue to monitor blood pressure regularly and reach out to clinic should BP elevate >140/90 on average -A1c did increase to 6.9% - advised for patient to continue to make dietary changes and moderate carb intake to prevent further increase, patient to increase exercise to help lower BG as well  Subjective: GBEVA REMUNDis an 75y.o. year old female who is a primary patient of Burns, SClaudina Lick MD.  The CCM team was consulted for assistance with disease management and care coordination needs.    Engaged with patient by telephone for follow up visit in response to provider referral for pharmacy case management and/or care coordination services.   Consent to Services:  The patient was given the following information about Chronic Care Management services today, agreed to services, and gave verbal consent: 1. CCM service includes personalized support from designated clinical staff supervised by the primary care provider, including individualized plan of care and coordination with other care providers 2. 24/7 contact phone numbers for assistance for urgent and routine care needs. 3. Service will only be billed when office clinical staff spend 20 minutes or more in a month to coordinate care. 4. Only one  practitioner may furnish and bill the service in a calendar month. 5.The patient may stop CCM services at any time (effective at the end of the month) by phone call to the office staff. 6. The patient will be responsible for cost sharing (co-pay) of up to 20% of the service fee (after annual deductible is met). Patient agreed to services and consent obtained.  Patient Care Team: BBinnie Rail MD as PCP - General (Internal Medicine) PFreada Bergeron MD as PCP - Cardiology (Cardiology) LRolm Bookbinder MD (Dermatology) MMarica Otter OD (Optometry) MJuanita Craver MD as Consulting Physician (Gastroenterology) CStarling Manns MD (Orthopedic Surgery) STomasa Blase [facility] Pharmacist (Pharmacist)  Recent office visits: 11/11/2020 - Dr. BQuay Burow-  stable, no medication changes   Recent consult visits: 11/23/2020 - Dr. GBaxter FlatteryDO - rheumatology- drug induced lupus, psoriasis - stable, continue current medications  10/29/2020 - CLaurann MontanaNP - Cardiology - no changes to medications - continue crestor twice weekly along with self reduce vascepa dose 10/21/2020 - Dr. MEarlean Shawl- GGertie Fey- no changes at this time   08/31/2020 - Dermatology - no changes to medications doing well on current regimen 08/06/2020 - Gastro procedure-  had EGD/EUS   Hospital visits: None in previous 6 months  Objective:  Lab Results  Component Value Date   CREATININE 1.13 11/11/2020   BUN 13 11/11/2020   GFR 47.08 (L) 11/11/2020   GFRNONAA 50 (L) 11/05/2019   GFRAA 58 (L) 11/05/2019   NA 138 11/11/2020   K 4.6 11/11/2020   CALCIUM 9.8 11/11/2020   CO2 28 11/11/2020   GLUCOSE 104 (H) 11/11/2020  Lab Results  Component Value Date/Time   HGBA1C 6.9 (H) 11/11/2020 09:19 AM   HGBA1C 6.7 (H) 06/23/2020 08:37 AM   GFR 47.08 (L) 11/11/2020 09:19 AM   GFR 48.76 (L) 06/23/2020 08:37 AM   MICROALBUR <0.7 10/28/2015 09:18 AM   MICROALBUR <0.7 12/23/2014 12:02 PM    Last diabetic Eye exam:  Lab Results   Component Value Date/Time   HMDIABEYEEXA No Retinopathy 07/01/2016 12:00 AM    Last diabetic Foot exam:  No results found for: HMDIABFOOTEX   Lab Results  Component Value Date   CHOL 193 11/11/2020   HDL 54.30 11/11/2020   LDLCALC 103 (H) 11/11/2020   LDLDIRECT 193.0 02/24/2020   TRIG 178.0 (H) 11/11/2020   CHOLHDL 4 11/11/2020    Hepatic Function Latest Ref Rng & Units 11/11/2020 06/23/2020 02/24/2020  Total Protein 6.0 - 8.3 g/dL 7.4 7.5 7.5  Albumin 3.5 - 5.2 g/dL 4.0 4.1 4.2  AST 0 - 37 U/L _0 ALT 0 - 35 U/L 22 27 37(H)  Alk Phosphatase 39 - 117 U/L 76 77 87  Total Bilirubin 0.2 - 1.2 mg/dL 0.6 0.7 0.5  Bilirubin, Direct 0.00 - 0.40 mg/dL - - -    Lab Results  Component Value Date/Time   TSH 2.63 11/11/2020 09:19 AM   TSH 3.00 02/24/2020 10:01 AM   FREET4 0.91 11/11/2020 09:19 AM   FREET4 0.75 05/01/2017 09:01 AM    CBC Latest Ref Rng & Units 11/11/2020 06/23/2020 02/24/2020  WBC 4.0 - 10.5 K/uL 8.3 6.6 7.5  Hemoglobin 12.0 - 15.0 g/dL 12.7 13.1 13.0  Hematocrit 36.0 - 46.0 % 38.2 39.4 39.1  Platelets 150.0 - 400.0 K/uL 300.0 275.0 304.0    Lab Results  Component Value Date/Time   VD25OH 45.51 06/23/2020 08:37 AM   VD25OH 50.62 02/24/2020 10:01 AM    Clinical ASCVD: No  The 10-year ASCVD risk score (Arnett DK, et al., 2019) is: 56%   Values used to calculate the score:     Age: 75 years     Sex: Female     Is Non-Hispanic African American: No     Diabetic: Yes     Tobacco smoker: No     Systolic Blood Pressure: 893 mmHg     Is BP treated: Yes     HDL Cholesterol: 54.3 mg/dL     Total Cholesterol: 193 mg/dL    Depression screen [facility] 2/9 02/24/2020 01/02/2019 01/10/2018  Decreased Interest 0 0 0  Down, Depressed, Hopeless 1 0 0  PHQ - 2 Score 1 0 0  Some recent data might be hidden      Social History   Tobacco Use  Smoking Status Former   Packs/day: 1.00   Years: 10.00   Pack years: 10.00   Types: Cigarettes   Quit date: 02/14/1974    Years since quitting: 46.8  Smokeless Tobacco Never   BP Readings from Last 3 Encounters:  11/11/20 (!) 142/82  10/29/20 (!) 144/84  08/06/20 107/60   Pulse Readings from Last 3 Encounters:  11/11/20 74  08/06/20 (!) 58  06/23/20 (!) 58   Wt Readings from Last 3 Encounters:  11/11/20 153 lb (69.4 kg)  10/29/20 153 lb (69.4 kg)  08/06/20 150 lb (68 kg)   BMI Readings from Last 3 Encounters:  11/11/20 28.91 kg/m  10/29/20 28.91 kg/m  08/06/20 28.34 kg/m    Assessment/Interventions: Review of patient past medical history, allergies, medications, health status, including review of consultants reports,  laboratory and other test data, was performed as part of comprehensive evaluation and provision of chronic care management services.   SDOH:  (Social Determinants of Health) assessments and interventions performed: Yes  SDOH Screenings   Alcohol Screen: Low Risk    Last Alcohol Screening Score (AUDIT): 0  Depression (PHQ2-9): Low Risk    PHQ-2 Score: 1  Financial Resource Strain: Low Risk    Difficulty of Paying Living Expenses: Not hard at all  Food Insecurity: No Food Insecurity   Worried About Charity fundraiser in the Last Year: Never true   Ran Out of Food in the Last Year: Never true  Housing: [place] Risk Score: 0  Physical Activity: Inactive   Days of Exercise per Week: 0 days   Minutes of Exercise per Session: 0 min  Social Connections: Unknown   Frequency of Communication with Friends and Family: More than three times a week   Frequency of Social Gatherings with Friends and Family: Once a week   Attends Religious Services: Patient refused   Marine scientist or Organizations: Patient refused   Attends Archivist Meetings: Patient refused   Marital Status: Patient refused  Stress: No Stress Concern Present   Feeling of Stress : Not at all  Tobacco Use: Medium Risk   Smoking Tobacco Use: Former   Smokeless Tobacco Use: Never   Transportation Needs: No Data processing manager (Medical): No   Lack of Transportation (Non-Medical): No    CCM Care Plan  Allergies  Allergen Reactions   Phenergan [Promethazine Hcl] Shortness Of Breath and Anxiety   Repatha [Evolocumab]     Muscle pain and weakness   Amlodipine Swelling   Clarithromycin Nausea And Vomiting    May have episodes of syncope due to prolonged vomiting    Codeine Nausea And Vomiting    May have episodes of syncope due to prolonged vomiting   Erythromycin Nausea And Vomiting   Hydralazine Hcl     Drug-induced lupus - pt wishes to avoid   Statins     Muscle pain    Tetracycline Nausea And Vomiting    Medications Reviewed Today     Reviewed by Tomasa Blase, [facility] (Pharmacist) on 11/25/20 at [place] List Status: <None>   Medication Order Taking? Sig Documenting Provider Last Dose Status Informant  ammonium lactate (LAC-HYDRIN) 12 % lotion [id number] No APPLY TO ENTIRE BODY TWICE DAILY  Patient taking differently: Apply 1 application topically daily.    Taking Active   apixaban (ELIQUIS) 5 MG TABS tablet [id number]  Take 1 tablet (5 mg total) by mouth 2 (two) times daily. [name], MD  Active   Ascorbic Acid (VITAMIN C GUMMIE PO) [id number] No Take 280 mg by mouth every evening. Each tablet 140 mg [provider] Taking Active Self  calcium carbonate (TUMS EX) 750 MG chewable tablet [id number] No Chew 750 mg by mouth 2 (two) times a week. [provider] Taking Active Self  Cholecalciferol (VITAMIN D3 GUMMIES ADULT PO) [id number] No Take 75 mcg by mouth every evening. [provider] Taking Active Self  clindamycin (CLEOCIN T) 1 % external solution [id number] No Apply 1 application topically daily as needed (wounds).  [provider] Taking Active Self  clobetasol (TEMOVATE) 0.05 % external solution [id number] No APPLY TOPICALLY DAILY. TO AFFECTED AREAS  Patient taking differently: Apply 1  application topically daily.    Taking Active  Coenzyme Q10 (COQ10 GUMMIES ADULT PO) [id number] No Take 200 mg by mouth in the morning and at bedtime. [provider] Taking Active Self  COVID-19 mRNA bivalent vaccine, Pfizer, (PFIZER COVID-19 VAC BIVALENT) injection [id number] No Inject into the muscle. Carlyle Basques, MD Taking Active   CREON [phone] units CPEP capsule [id number] No Take 36,000 Units by mouth 2 (two) times daily with a meal. [provider] Taking Active Self  diclofenac Sodium (VOLTAREN) 1 % GEL [id number] No Apply 2 g topically as needed. On hand [provider] Taking Active Self  diphenhydrAMINE (BENADRYL) 12.5 MG chewable tablet [id number] No Chew 12.5 mg by mouth at bedtime. [provider] Taking Active Self  folic acid (FOLVITE) 1 MG tablet [id number] No TAKE 1 TABLET BY MOUTH EVERY DAY Burns, Claudina Lick, MD Taking Active   glucose blood (ONETOUCH ULTRA) test strip [id number]  USE TEST STRIPS TO CHECK BLOOD SUGAR DAILY AND AS NEEDED.  E11.9 Burns, Claudina Lick, MD  Active   halobetasol (ULTRAVATE) 0.05 % cream [id number] No APPLY TOPICALLY 2 TIMES DAILY.  Patient taking differently: Apply 1 application topically daily.    Taking Active   icosapent Ethyl (VASCEPA) 1 g capsule [id number] No Take 1 g by mouth 2 (two) times daily. [provider] Taking Active   lisinopril (ZESTRIL) 10 MG tablet [id number] No TAKE 1 TABLET BY MOUTH EVERY DAY Burns, Claudina Lick, MD Taking Active   Multiple Vitamins-Minerals (ADULT GUMMY PO) [id number] No Take 1 tablet by mouth every evening. [place] gummie formula [provider] Taking Active Self  nebivolol (BYSTOLIC) 2.5 MG tablet [id number] No TAKE 1 TABLET BY MOUTH EVERY DAY  Patient taking differently: 2.5 mg daily.   [name], MD Taking Active   omeprazole (PRILOSEC OTC) 20 MG tablet [id number] No Take 20 mg by mouth daily. [provider] Taking Active Self  ondansetron  (ZOFRAN-ODT) 8 MG disintegrating tablet [id number] No Take 1 tablet (8 mg total) by mouth every 8 (eight) hours as needed.  Patient taking differently: Take 8 mg by mouth every 8 (eight) hours as needed for nausea.   [name], MD Taking Active Self  oxymetazoline (AFRIN) 0.05 % nasal spray [id number] No Place 1 spray into both nostrils 2 (two) times daily. [provider] Taking Active Self  polyethylene glycol (MIRALAX / GLYCOLAX) 17 g packet [id number] No Take 17 g by mouth every 14 (fourteen) days. [provider] Taking Active Self  rosuvastatin (CRESTOR) 5 MG tablet [id number] No Take 5 mg by mouth 2 (two) times a week. Sometimes 3 times a week [provider] Taking Active   vitamin B-12 (CYANOCOBALAMIN) 1000 MCG tablet [id number] No Take 1,000 mcg by mouth daily. [provider] Taking Active Self            Patient Active Problem List   Diagnosis Date Noted   Weight gain 11/11/2020   Hypervitaminosis E 11/11/2020   Malabsorption 06/23/2020   Fatty (change of) liver, not elsewhere classified 04/16/2020   CKD (chronic kidney disease) stage 3, GFR 30-59 ml/min (HCC) - Dr Harrie Jeans, [facility] 02/24/2020   Drug-induced systemic lupus erythematosus ([place]) 02/24/2020   Aortic atherosclerosis ([place]) 29/24/4628   Folic acid deficiency 63/81/7711   Bilateral leg edema 03/19/2019   Hemorrhoids 03/19/2019   B12 deficiency 03/19/2019   Abnormal endoscopy of upper gastrointestinal tract 02/27/2019   Iron deficiency anemia 02/19/2019   Hypomagnesemia    Inflammatory arthritis - Dr Baxter Flattery 01/02/2019  Positive ANA (antinuclear antibody) 01/02/2019   Exocrine pancreatic insufficiency - Dr Earlean Shawl 10/23/2018   Multiple pulmonary nodules determined by computed tomography of lung 09/18/2018   Syncope 08/01/2018   Pneumothorax 03/14/2018   Lung abnormality 01/11/2018   History of pulmonary embolism 12/24/2017   Lung mass 12/24/2017   Small vessel disease,  cerebrovascular 07/31/2017   Hyperuricemia 05/03/2017   Arthralgia 05/01/2017   Hair loss 05/01/2017   Vitamin D deficiency 62/83/1517   Diastolic dysfunction 61/60/7371   Vertigo 12/24/2015   Fatigue 12/24/2015   Bilateral carotid artery disease, Mild 05/31/2015   Thyroid nodule 05/31/2015   Psoriasis    Scoliosis    Diabetes type 2, controlled ([place]) 06/03/2008   Dyslipidemia 06/03/2008   CARPAL TUNNEL SYNDROME 06/03/2008   HTN (hypertension) 06/03/2008   ALLERGIC RHINITIS 06/03/2008   Asthma 06/03/2008   GERD (gastroesophageal reflux disease) 06/03/2008   Osteopenia 06/03/2008   COLONIC POLYPS, HX OF 06/03/2008    Immunization History  Administered Date(s) Administered   Fluad Quad(high Dose 65+) 10/23/2018, 11/05/2019, 11/11/2020   Influenza Split 11/29/2010, 11/15/2011   Influenza, High Dose Seasonal PF 12/28/2012, 10/28/2015, 10/31/2016, 10/30/2017   Influenza,inj,Quad PF,6+ Mos 10/22/2013, 11/07/2014   PFIZER(Purple Top)SARS-COV-2 Vaccination 03/13/2019, 04/03/2019, 10/15/2019, 06/12/2020, 10/15/2020   PPD Test 08/27/2012, 11/06/2014, 01/18/2016   Pfizer Covid-19 Vaccine Bivalent Booster 44yr & up 11/02/2020   Pneumococcal Conjugate-13 10/22/2013   Pneumococcal Polysaccharide-23 02/14/2002, 12/23/2014   Td 02/15/2004, 06/03/2014    Conditions to be addressed/monitored:  Hypertension, Hyperlipidemia, Diabetes, GERD, Chronic Kidney Disease, Allergic Rhinitis and Psoriasis   Care Plan : CCM Care Plan  Updates made by STomasa Blase RPH since 11/25/2020 12:00 AM     Problem: HTN, CAD, HLD, DM2 (diet controlled), Psoriasis   Priority: High  Onset Date: 07/15/2020     Long-Range Goal: Disease Management   Start Date: 07/15/2020  Expected End Date: 01/14/2021  This Visit's Progress: On track  Recent Progress: On track  Priority: High  Note:   Current Barriers:  Unable to independently monitor therapeutic efficacy Unable to achieve control of LDL    Pharmacist  Clinical Goal(s):  Patient will verbalize ability to afford treatment regimen achieve adherence to monitoring guidelines and medication adherence to achieve therapeutic efficacy achieve control of LDL  as evidenced by next lipid panel maintain control of blood pressure  as evidenced by continued blood pressure control   through collaboration with PharmD and provider.   Interventions: 1:1 collaboration with BBinnie Rail MD regarding development and update of comprehensive plan of care as evidenced by provider attestation and co-signature Inter-disciplinary care team collaboration (see longitudinal plan of care) Comprehensive medication review performed; medication list updated in electronic medical record  Hypertension (BP goal <140/90) -Controlled -Current treatment: Lisinopril 131mdaily  Nebivolol 2.75m79maily  -Medications previously tried: amlodipine, hyralazine, furosemide, olmesartan, metoprolol   -Current home readings:- usually can average 120/70-60 -Current dietary habits: watches her sodium intake, staying hydrated throughout the day -Current exercise habits: previously had been walking 2-3 miles daily, has not returned to 2-3 miles - but has started walking regularly  -Denies hypotensive/hypertensive symptoms -Educated on BP goals and benefits of medications for prevention of heart attack, stroke and kidney damage; Daily salt intake goal < 2300 mg; Exercise goal of 150 minutes per week; Importance of home blood pressure monitoring; -Counseled to monitor BP at home weekly to biweekly, document, and provide log at future appointments -Recommended to continue current medication  Hyperlipidemia: (LDL goal < 70) -improved  Lab Results  Component Value Date   LDLCALC 103 (H) 11/11/2020  -Current treatment: Rosuvastatin 74m 2-3 times weekly  Co-Q-10 - 40144mdaily  Vascepa 1g - 1 capsule twice daily (patient has concerns about bleeding) -Medications previously tried:  pitavastatin, pravastatin, ezetimibe, repatha -Current dietary patterns: small amounts of meat / protein - pork tenderloins ( no more than 2oz at a time) / small amount of chicken / ground beef  -Current exercise habits: used to walk 2-3 miles daily - recently has not been as active, is trying to get back into walking more consistently  -Educated on Cholesterol goals;  Benefits of statin for ASCVD risk reduction; Importance of limiting foods high in cholesterol; Exercise goal of 150 minutes per week; Strategies to manage statin-induced myalgias; -Recommended to continue current medication Assessed previous medications that patient has tried and failed to help with cholesterol levels, patient working with lipid clinic , LDL has improved since starting crestor 44m6mtwice weekly - has been the medication she has tolerated best, declined trial of praluent due to previous issues with repatha   Diabetes (A1c goal <7%) -Controlled - diet controlled  Lab Results  Component Value Date   HGBA1C 6.9 (H) 11/11/2020  -Current medications: n/a -Medications previously tried: n/a  -Denies hypoglycemic/hyperglycemic symptoms -Current meal patterns:  breakfast: cup of tea / 1/2 cup of coffee, water, yogurt (whole milk) - no artificial sweetener   lunch: left overs, stir fry, sandwich (lite bread) cucumbers and peppers  dinner: small portion of protein, stir fry, filet mignon - reports that she does not go over 2 oz / half of a thin cut pork chop, peas, cabbage, broccoli, salad snacks: naan bread with artichoke spinach dip, low sodium pretzels, apple slices, Mandarin oranges (on occasion), cherries  drinks: water,  -Current exercise: no scheduled exercise or activity at this time  -Educated on A1c and blood sugar goals; Complications of diabetes including kidney damage, retinal damage, and cardiovascular disease; Exercise goal of 150 minutes per week; Proper insulin injection technique; -Counseled to  check feet daily and get yearly eye exams -Counseled on diet and exercise extensively Recommended for patient to replace gummie vitamins and supplements with tablets to reduce carbs that she is eating throughout the day  Psoriasis (Goal: Prevention of skin irritation/ flares) -Controlled -Current treatment  Ammonium Lactate 12% lotion - applied 2 times daily  Halobetasol 0.05% cream - applied 2 times daily  Clobetasol 0.05% solution - applied daily  Clindamycin 1% solution - applied daily  -Medications previously tried: finacea , fluocinolone, flurandrenolide, hydrocortisone, nystatin-triacinolone -Recommended to continue current medication   History of PE (unprovoked) (Goal: Prevention of thrombus) -Controlled -Current treatment  Eliquis 44mg31mice daily  -Medications previously tried: Xarelto  -Recommended to continue current medication   Exocrine Pancreatic Deficiency / GERD (Goal: management of digestive symptoms) -Not ideally controlled -Current treatment  Creon 36000-114000 units - taken 2 times daily with meals  Omeprazole 20mg344mly  Ondansetron 8mg O14m- 1 tablet every 8 hours as needed - uses maybe 1-2 times weekly  Loperamide 2mg - 70mablet up to 4 times daily as needed  -Medications previously tried: Lamotil, famotidine, metoclopramide, simethicone -Recommended close follow up with GI   Vitamin B, C, and D deficiencies / Hypomagnesia  (Goal: vitamin and mineral labs in range, prevention of deficiencies) -Not ideally controlled -Current treatment  Vitamin C - 188mg da80m Vit B12 - 1000mcg 9794IAXFolic Acid - 1mg dail144mVit D3 gummies -  3000 IU daily   Magnesium - 139m daily  Multivitamin -1 tablet daily  Voltaren 1% gel - applied 4 times daily as needed  -Counseled on diet and exercise extensively Recommended for patient continue current daily     Chronic Kidney Disease (Goal: Prevention of Disease progression) -Controlled  - stable  -Last Scr:1.1  mg/dL -Last eGFR: 47.023mmin -Educated on Patient to avoid use of NSAIDS (PO) -reviewed with patient importance of adequate hydration and avoidance of excessive sodium   - Reviewed current medications which are dosed appropriately based on most recent kidney function   Allergic Rhinitis (Goal: Symptom management / flare prevention) -Controlled -Current treatment  Loratadine 1056maily if needed - rarely uses  Affrin Nasal 0.05% spray - 1 spray into each nostril twice daily  Diphenhydramine 12.5mg77mghtly - helps with sleep -Medications previously tried: mometasone, nasacort  -Recommended to continue current medication   Patient Goals/Self-Care Activities Patient will:  - take medications as prescribed check blood pressure once weekly , document, and provide at future appointments target a minimum of 150 minutes of moderate intensity exercise weekly engage in dietary modifications by reducing consumption of red meats, increase lean/ white meat intake/ increase green vegetables   Follow Up Plan: Telephone follow up appointment with care management team member scheduled for: 6 months The patient has been provided with contact information for the care management team and has been advised to call with any health related questions or concerns.      Medication Assistance: None required.  Patient affirms current coverage meets needs.  Patient's preferred pharmacy is:  [id number] [place] [facility] [address]: [phone] pill box? No Pt endorses 100% compliance  Care Plan and Follow Up Patient Decision:  Patient agrees to Care Plan and Follow-up.  Plan: Telephone follow up appointment with care management team member scheduled for:  4 months  and The patient has been provided with contact information for the care management team and has been advised to call  with any health related questions or concerns.    DanieTomasa BlasermD Clinical Pharmacist, [place]

## 2020-12-01 ENCOUNTER — Other Ambulatory Visit (HOSPITAL_COMMUNITY): Payer: Self-pay | Admitting: Internal Medicine

## 2020-12-01 DIAGNOSIS — Q438 Other specified congenital malformations of intestine: Secondary | ICD-10-CM

## 2020-12-01 DIAGNOSIS — C7A8 Other malignant neuroendocrine tumors: Secondary | ICD-10-CM

## 2020-12-14 ENCOUNTER — Other Ambulatory Visit: Payer: Self-pay | Admitting: Internal Medicine

## 2020-12-14 ENCOUNTER — Other Ambulatory Visit: Payer: Self-pay

## 2020-12-14 ENCOUNTER — Other Ambulatory Visit (HOSPITAL_COMMUNITY): Payer: Self-pay | Admitting: Internal Medicine

## 2020-12-14 ENCOUNTER — Encounter (HOSPITAL_COMMUNITY)
Admission: RE | Admit: 2020-12-14 | Discharge: 2020-12-14 | Disposition: A | Discharge status: Home / Self Care | Payer: Medicare Other | Source: Ambulatory Visit | Attending: Internal Medicine | Admitting: Internal Medicine

## 2020-12-14 DIAGNOSIS — C7A8 Other malignant neuroendocrine tumors: Secondary | ICD-10-CM

## 2020-12-14 DIAGNOSIS — Q438 Other specified congenital malformations of intestine: Secondary | ICD-10-CM | POA: Diagnosis present

## 2020-12-14 DIAGNOSIS — E785 Hyperlipidemia, unspecified: Secondary | ICD-10-CM | POA: Diagnosis not present

## 2020-12-14 DIAGNOSIS — E118 Type 2 diabetes mellitus with unspecified complications: Secondary | ICD-10-CM | POA: Diagnosis not present

## 2020-12-14 DIAGNOSIS — I10 Essential (primary) hypertension: Secondary | ICD-10-CM | POA: Diagnosis not present

## 2020-12-14 MED ORDER — GALLIUM GA 68 DOTATATE IV KIT
4.1000 | PACK | Freq: Once | INTRAVENOUS | Status: AC | PRN
  Administered 2020-12-14: 4.1 via INTRAVENOUS

## 2020-12-25 ENCOUNTER — Telehealth: Payer: Self-pay | Admitting: Internal Medicine

## 2020-12-25 NOTE — Telephone Encounter (Signed)
Pt scheduled in new patient nodule consult slot on 01/21/21. Pt stated understanding. Nothing further needed at this time.

## 2020-12-25 NOTE — Telephone Encounter (Signed)
Fine with me

## 2020-12-25 NOTE — Telephone Encounter (Signed)
Pt would like to switch from [facility] to [place]. Pt feels like she is a "complicated case" Pt has had recent pet scan that showed multiple nodules and has not seen MW since 09/2018. Pt states she would like to see RB as she doesn't feel MW is not the correct dr for her. Please advise [phone]

## 2020-12-25 NOTE — Telephone Encounter (Signed)
Yes I am. 

## 2020-12-25 NOTE — Telephone Encounter (Signed)
Noted.   RB and MW please advise if okay with switch. Thanks :)

## 2020-12-25 NOTE — Telephone Encounter (Signed)
Dr Lamonte Sakai, are you okay with this switch from Dr Melvyn Novas to you? Pt with multiple nodules, had recent PET that's in Epic 12/14/20. Seen by Dr Lake Bells in the past. Thanks!

## 2020-12-29 ENCOUNTER — Telehealth: Payer: Self-pay | Admitting: Internal Medicine

## 2020-12-29 ENCOUNTER — Ambulatory Visit: Payer: Medicare Other | Admitting: Internal Medicine

## 2020-12-29 NOTE — Telephone Encounter (Signed)
Scheduled appt per 11/15 referral. Pt is aware of appt date and time.

## 2020-12-30 ENCOUNTER — Other Ambulatory Visit: Payer: Self-pay

## 2020-12-30 ENCOUNTER — Inpatient Hospital Stay: Payer: Medicare Other | Attending: Internal Medicine

## 2020-12-30 ENCOUNTER — Encounter: Payer: Self-pay | Admitting: Internal Medicine

## 2020-12-30 ENCOUNTER — Inpatient Hospital Stay (HOSPITAL_BASED_OUTPATIENT_CLINIC_OR_DEPARTMENT_OTHER): Payer: Medicare Other | Admitting: Internal Medicine

## 2020-12-30 VITALS — BP 179/97 | HR 73 | Temp 97.9°F | Resp 20 | Ht 61.0 in | Wt 150.7 lb

## 2020-12-30 DIAGNOSIS — I10 Essential (primary) hypertension: Secondary | ICD-10-CM | POA: Insufficient documentation

## 2020-12-30 DIAGNOSIS — K639 Disease of intestine, unspecified: Secondary | ICD-10-CM | POA: Insufficient documentation

## 2020-12-30 DIAGNOSIS — J984 Other disorders of lung: Secondary | ICD-10-CM

## 2020-12-30 DIAGNOSIS — K8681 Exocrine pancreatic insufficiency: Secondary | ICD-10-CM | POA: Diagnosis not present

## 2020-12-30 DIAGNOSIS — Z7901 Long term (current) use of anticoagulants: Secondary | ICD-10-CM | POA: Diagnosis not present

## 2020-12-30 DIAGNOSIS — Z86711 Personal history of pulmonary embolism: Secondary | ICD-10-CM | POA: Diagnosis present

## 2020-12-30 DIAGNOSIS — R918 Other nonspecific abnormal finding of lung field: Secondary | ICD-10-CM

## 2020-12-30 DIAGNOSIS — D3A8 Other benign neuroendocrine tumors: Secondary | ICD-10-CM | POA: Diagnosis not present

## 2020-12-30 DIAGNOSIS — R933 Abnormal findings on diagnostic imaging of other parts of digestive tract: Secondary | ICD-10-CM | POA: Insufficient documentation

## 2020-12-30 DIAGNOSIS — R197 Diarrhea, unspecified: Secondary | ICD-10-CM | POA: Diagnosis not present

## 2020-12-30 LAB — CMP (CANCER CENTER ONLY)
ALT: 22 U/L (ref 0–44)
AST: 17 U/L (ref 15–41)
Albumin: 3.6 g/dL (ref 3.5–5.0)
Alkaline Phosphatase: 86 U/L (ref 38–126)
Anion gap: 9 (ref 5–15)
BUN: 12 mg/dL (ref 8–23)
CO2: 27 mmol/L (ref 22–32)
Calcium: 10.3 mg/dL (ref 8.9–10.3)
Chloride: 104 mmol/L (ref 98–111)
Creatinine: 1.13 mg/dL — ABNORMAL HIGH (ref 0.44–1.00)
GFR, Estimated: 50 mL/min — ABNORMAL LOW (ref >60)
Glucose, Bld: 95 mg/dL (ref 70–99)
Potassium: 4.7 mmol/L (ref 3.5–5.1)
Sodium: 140 mmol/L (ref 135–145)
Total Bilirubin: 0.5 mg/dL (ref 0.3–1.2)
Total Protein: 7.2 g/dL (ref 6.5–8.1)

## 2020-12-30 LAB — CBC WITH DIFFERENTIAL (CANCER CENTER ONLY)
Abs Immature Granulocytes: 0.03 10*3/uL (ref 0.00–0.07)
Basophils Absolute: 0.1 10*3/uL (ref 0.0–0.1)
Basophils Relative: 1 %
Eosinophils Absolute: 1 10*3/uL — ABNORMAL HIGH (ref 0.0–0.5)
Eosinophils Relative: 11 %
HCT: 40 % (ref 36.0–46.0)
Hemoglobin: 12.9 g/dL (ref 12.0–15.0)
Immature Granulocytes: 0 %
Lymphocytes Relative: 22 %
Lymphs Abs: 1.9 10*3/uL (ref 0.7–4.0)
MCH: 29.9 pg (ref 26.0–34.0)
MCHC: 32.3 g/dL (ref 30.0–36.0)
MCV: 92.6 fL (ref 80.0–100.0)
Monocytes Absolute: 0.8 10*3/uL (ref 0.1–1.0)
Monocytes Relative: 9 %
Neutro Abs: 5.1 10*3/uL (ref 1.7–7.7)
Neutrophils Relative %: 57 %
Platelet Count: 309 10*3/uL (ref 150–400)
RBC: 4.32 MIL/uL (ref 3.87–5.11)
RDW: 14.1 % (ref 11.5–15.5)
WBC Count: 8.9 10*3/uL (ref 4.0–10.5)
nRBC: 0 % (ref 0.0–0.2)

## 2020-12-30 NOTE — Progress Notes (Signed)
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  PRIOR THERAPY: None  CURRENT THERAPY: Xarelto 15 mg p.o. twice daily for 3 weeks followed by 20 mg p.o. daily.  INTERVAL HISTORY: [name] 75 y.o. female returns to the clinic today for follow-up visit.  The patient is feeling fine today with no concerning complaints except for fatigue and intermittent diarrhea.  She is currently on Creon by Dr. Rush Landmark.  She underwent upper endoscopy with biopsy by him but there was no clear histologic evidence of the carcinoid tumor or other malignant cells.  She had a recent PET dotatate scan on December 14, 2020 and she is here for evaluation and discussion of her scan results and recommendation regarding her condition.  She has no current chest pain but has shortness of breath with exertion with no cough or hemoptysis.  She denied having any fever or chills.  She has no nausea, vomiting, abdominal pain or constipation.  She has no headache or visual changes.   MEDICAL HISTORY: Past Medical History:  Diagnosis Date   ALLERGIC RHINITIS    ANEMIA-NOS    Arthritis    ASTHMA    Carpal tunnel syndrome    COLONIC POLYPS, HX OF    Coronary artery disease    "mild CAD" noted on 12/05/17 in coronary CT scan   DIABETES MELLITUS, TYPE II    diet controlled   GERD    Hand tingling    HYPERLIPIDEMIA    HYPERTENSION    Neuroendocrine tumor    OSTEOPENIA    PONV (postoperative nausea and vomiting)    " it relaxes my bladder muscles and I have been known to pee all over the place"   Psoriasis    severe, began soriatane 01/2012   Pulmonary embolism (HCC)    Rectal fissure    Scoliosis    Wears  glasses     ALLERGIES:  is allergic to phenergan [promethazine hcl], repatha [evolocumab], amlodipine, clarithromycin, codeine, erythromycin, hydralazine hcl, statins, and tetracycline.  MEDICATIONS:  Current Outpatient Medications  Medication Sig Dispense Refill   ammonium lactate (LAC-HYDRIN) 12 % lotion APPLY TO ENTIRE BODY TWICE DAILY (Patient taking differently: Apply 1 application topically daily.) 396 g 11   apixaban (ELIQUIS) 5 MG TABS tablet Take 1 tablet (5 mg total) by mouth 2 (two) times daily. 60 tablet 5   Ascorbic Acid (VITAMIN C GUMMIE PO) Take 280 mg by mouth every evening. Each tablet 140 mg     calcium carbonate (TUMS EX) 750 MG chewable tablet Chew 750 mg by mouth 2 (two) times a week.     Cholecalciferol (VITAMIN D3 GUMMIES ADULT PO) Take 75 mcg by mouth every evening.     clindamycin (CLEOCIN T) 1 % external solution Apply 1 application topically daily as needed (wounds).      clobetasol (TEMOVATE) 0.05 % external solution APPLY TOPICALLY DAILY. TO AFFECTED AREAS (Patient taking differently: Apply 1 application topically daily.) 50 mL 5   Coenzyme Q10 (COQ10 GUMMIES ADULT PO) Take 200 mg by mouth in the morning and at bedtime.     COVID-19 mRNA bivalent vaccine, Pfizer, (PFIZER COVID-19 VAC BIVALENT) injection Inject into the muscle. 0.3 mL 0  CREON 36000-114000 units CPEP capsule Take 36,000 Units by mouth 2 (two) times daily with a meal.     diclofenac Sodium (VOLTAREN) 1 % GEL Apply 2 g topically as needed. On hand     diphenhydrAMINE (BENADRYL) 12.5 MG chewable tablet Chew 12.5 mg by mouth at bedtime.     folic acid (FOLVITE) 1 MG tablet TAKE 1 TABLET BY MOUTH EVERY DAY 30 tablet 5   glucose blood (ONETOUCH ULTRA) test strip USE TEST STRIPS TO CHECK BLOOD SUGAR DAILY AND AS NEEDED.  E11.9 300 strip 1   halobetasol (ULTRAVATE) 0.05 % cream APPLY TOPICALLY 2 TIMES DAILY. (Patient taking differently: Apply 1 application topically daily.) 50 g 5   icosapent Ethyl  (VASCEPA) 1 g capsule Take 1 g by mouth 2 (two) times daily.     lisinopril (ZESTRIL) 10 MG tablet TAKE 1 TABLET BY MOUTH EVERY DAY 30 tablet 5   loratadine (CLARITIN) 10 MG tablet Take by mouth.     Multiple Vitamins-Minerals (ADULT GUMMY PO) Take 1 tablet by mouth every evening. Children [employer] gummie formula     nebivolol (BYSTOLIC) 2.5 MG tablet TAKE 1 TABLET BY MOUTH EVERY DAY *BRAND BYSTOLIC* 30 tablet 5   omeprazole (PRILOSEC OTC) 20 MG tablet Take 20 mg by mouth daily.     ondansetron (ZOFRAN-ODT) 8 MG disintegrating tablet Take 1 tablet (8 mg total) by mouth every 8 (eight) hours as needed. (Patient taking differently: Take 8 mg by mouth every 8 (eight) hours as needed for nausea.) 30 tablet 5   oxymetazoline (AFRIN) 0.05 % nasal spray Place 1 spray into both nostrils 2 (two) times daily.     polyethylene glycol (MIRALAX / GLYCOLAX) 17 g packet Take 17 g by mouth every 14 (fourteen) days.     rosuvastatin (CRESTOR) 5 MG tablet Take 5 mg by mouth 2 (two) times a week. Sometimes 3 times a week     vitamin B-12 (CYANOCOBALAMIN) 1000 MCG tablet Take 1,000 mcg by mouth daily.     No current facility-administered medications for this visit.    SURGICAL HISTORY:  Past Surgical History:  Procedure Laterality Date   benign rectal growth  2004   removed by Dr. Zella Richer   BIOPSY  08/09/2018   Procedure: BIOPSY;  Surgeon: Carol Ada, MD;  Location: [facility];  Service: Endoscopy;;   BIOPSY  08/22/2018   Procedure: BIOPSY;  Surgeon: Juanita Craver, MD;  Location: [place];  Service: Endoscopy;;   BIOPSY  03/07/2019   Procedure: BIOPSY;  Surgeon: Irving Copas., MD;  Location: [facility];  Service: Gastroenterology;;   BIOPSY  08/06/2020   Procedure: BIOPSY;  Surgeon: Irving Copas., MD;  Location: [facility];  Service: Gastroenterology;;   BREAST BIOPSY Right    BREAST EXCISIONAL BIOPSY Left    BREAST SURGERY  1988   biopsy   CESAREAN SECTION      COLONOSCOPY N/A 08/22/2018   Procedure: COLONOSCOPY;  Surgeon: Juanita Craver, MD;  Location: [facility];  Service: Endoscopy;  Laterality: N/A;   ENDOSCOPIC MUCOSAL RESECTION N/A 03/07/2019   Procedure: ENDOSCOPIC MUCOSAL RESECTION;  Surgeon: Rush Landmark Telford Nab., MD;  Location: [place];  Service: Gastroenterology;  Laterality: N/A;   ESOPHAGOGASTRODUODENOSCOPY (EGD) WITH PROPOFOL N/A 08/04/2018   Procedure: ESOPHAGOGASTRODUODENOSCOPY (EGD) WITH PROPOFOL;  Surgeon: Ladene Artist, MD;  Location: [place];  Service: Gastroenterology;  Laterality: N/A;   ESOPHAGOGASTRODUODENOSCOPY (EGD) WITH PROPOFOL N/A 03/07/2019   Procedure: ESOPHAGOGASTRODUODENOSCOPY (EGD) WITH PROPOFOL;  Surgeon: Irving Copas.,  MD;  Location: [place];  Service: Gastroenterology;  Laterality: N/A;   ESOPHAGOGASTRODUODENOSCOPY (EGD) WITH PROPOFOL N/A 08/06/2020   Procedure: ESOPHAGOGASTRODUODENOSCOPY (EGD) WITH PROPOFOL;  Surgeon: Rush Landmark Telford Nab., MD;  Location: [place];  Service: Gastroenterology;  Laterality: N/A;   EUS N/A 03/07/2019   Procedure: UPPER ENDOSCOPIC ULTRASOUND (EUS) RADIAL;  Surgeon: Irving Copas., MD;  Location: [place];  Service: Gastroenterology;  Laterality: N/A;   EUS N/A 08/06/2020   Procedure: UPPER ENDOSCOPIC ULTRASOUND (EUS) RADIAL;  Surgeon: Irving Copas., MD;  Location: [place];  Service: Gastroenterology;  Laterality: N/A;   FINE NEEDLE ASPIRATION  03/07/2019   Procedure: FINE NEEDLE ASPIRATION (FNA) LINEAR;  Surgeon: Irving Copas., MD;  Location: [place];  Service: Gastroenterology;;   FINE NEEDLE ASPIRATION  08/06/2020   Procedure: FINE NEEDLE ASPIRATION;  Surgeon: Irving Copas., MD;  Location: [place];  Service: Gastroenterology;;   Otho Darner SIGMOIDOSCOPY N/A 08/04/2018   Procedure: Beryle Quant;  Surgeon: Ladene Artist, MD;  Location: [place];  Service: Gastroenterology;  Laterality: N/A;    FLEXIBLE SIGMOIDOSCOPY N/A 08/09/2018   Procedure: FLEXIBLE SIGMOIDOSCOPY;  Surgeon: Carol Ada, MD;  Location: [place];  Service: Endoscopy;  Laterality: N/A;   HEMOSTASIS CLIP PLACEMENT  03/07/2019   Procedure: HEMOSTASIS CLIP PLACEMENT;  Surgeon: Rush Landmark Telford Nab., MD;  Location: [place];  Service: Gastroenterology;;   SUBMUCOSAL LIFTING INJECTION  03/07/2019   Procedure: SUBMUCOSAL LIFTING INJECTION;  Surgeon: Irving Copas., MD;  Location: [place];  Service: Gastroenterology;;   VIDEO BRONCHOSCOPY WITH ENDOBRONCHIAL NAVIGATION N/A 03/14/2018   Procedure: VIDEO BRONCHOSCOPY WITH ENDOBRONCHIAL NAVIGATION;  Surgeon: Collene Gobble, MD;  Location: [facility];  Service: Thoracic;  Laterality: N/A;    REVIEW OF SYSTEMS:  Constitutional: positive for fatigue Eyes: negative Ears, nose, mouth, throat, and face: negative Respiratory: positive for dyspnea on exertion Cardiovascular: negative Gastrointestinal: positive for diarrhea and nausea Genitourinary:negative Integument/breast: negative Hematologic/lymphatic: negative Musculoskeletal:positive for arthralgias and muscle weakness Neurological: negative Behavioral/Psych: negative Endocrine: negative Allergic/Immunologic: negative   PHYSICAL EXAMINATION: General appearance: alert, cooperative, fatigued, and no distress Head: Normocephalic, without obvious abnormality, atraumatic Neck: no adenopathy, no JVD, supple, symmetrical, trachea midline, and thyroid not enlarged, symmetric, no tenderness/mass/nodules Lymph nodes: Cervical, supraclavicular, and axillary nodes normal. Resp: clear to auscultation bilaterally Back: symmetric, no curvature. ROM normal. No CVA tenderness. Cardio: regular rate and rhythm, S1, S2 normal, no murmur, click, rub or gallop GI: soft, non-tender; bowel sounds normal; no masses,  no organomegaly Extremities: extremities normal, atraumatic, no cyanosis or edema Neurologic: Alert and  oriented X 3, normal strength and tone. Normal symmetric reflexes. Normal coordination and gait  ECOG PERFORMANCE STATUS: 1 - Symptomatic but completely ambulatory  Blood pressure (!) 179/97, pulse 73, temperature 97.9 F (36.6 C), temperature source Tympanic, resp. rate 20, height 5\' 1"  (1.549 m), weight 150 lb 11.2 oz (68.4 kg), SpO2 96 %.  LABORATORY DATA: Lab Results  Component Value Date   WBC 8.9 12/30/2020   HGB 12.9 12/30/2020   HCT 40.0 12/30/2020   MCV 92.6 12/30/2020   PLT 309 12/30/2020      Chemistry      Component Value Date/Time   NA 140 12/30/2020 1002   NA 141 09/03/2018 0824   K 4.7 12/30/2020 1002   CL 104 12/30/2020 1002   CO2 27 12/30/2020 1002   BUN 12 12/30/2020 1002   BUN 8 09/03/2018 0824   CREATININE 1.13 (H) 12/30/2020 1002   CREATININE 1.08 (H) 11/05/2019 9357  Component Value Date/Time   CALCIUM 10.3 12/30/2020 1002   ALKPHOS 86 12/30/2020 1002   AST 17 12/30/2020 1002   ALT 22 12/30/2020 1002   BILITOT 0.5 12/30/2020 1002       RADIOGRAPHIC STUDIES: NM PET (NETSPOT GA 70 DOTATATE) SKULL BASE TO MID THIGH  Result Date: 12/14/2020 CLINICAL DATA:  Concern for neuroendocrine tumor of the duodenum. EXAM: NUCLEAR MEDICINE PET SKULL BASE TO THIGH TECHNIQUE: 4.1 mCi gallium 68 DOTATATE was injected intravenously. Full-ring PET imaging was performed from the skull base to thigh after the radiotracer. CT data was obtained and used for attenuation correction and anatomic localization. COMPARISON:  CT 05/11/2020, DOTATATE PET scan 02/28/2019 FINDINGS: NECK No radiotracer activity in neck lymph nodes. Incidental CT findings: None CHEST No radiotracer accumulation within mediastinal or hilar lymph nodes. Interval increase in pulmonary nodularity. For example RIGHT upper lobe pulmonary nodule measuring 6 mm (image 56/CT series 4). This nodule is not radiotracer avid. There is less well-defined on branching nodularity in the RIGHT middle lobe (image 59/4  )which appears tends along the bronchial tree with multiple small 3 mm nodes. Similar findings in the lingula. Discrete nodule in the RIGHT lower lobe measures 6 mm image 70/4. Incidental CT finding:None ABDOMEN/PELVIS Intense radiotracer activity within the stomach similar prior. Hiatal hernia anatomy. Within the C-loop of the duodenum there is a focus of radiotracer activity which is small and focal with SUV max equal 19.3 (image 108). This activity appears to have been obscured by proximity to the kidney on comparison DOTATATE PET scan. This activity corresponds to a small enhancing lesion on CT 05/11/2020. No radiotracer avid abdominal adenopathy. No abnormal activity liver. No additional activity in the small bowel or mesentery. Physiologic activity noted in the liver, spleen, adrenal glands and kidneys. Incidental CT findings:None SKELETON No focal activity to suggest skeletal metastasis. Incidental CT findings:None IMPRESSION: 1. Focal radiotracer activity localizing second portion the duodenum corresponds to enhancing lesion on comparison CT scan and consistent with a well differentiated small neuroendocrine tumor. 2. No evidence of metastatic adenopathy in the abdomen pelvis. No liver metastasis or mesenteric metastasis. 3. New bilateral pulmonary nodularity with discrete nodule in the RIGHT lung and peribronchial thickening and small nodularity in the RIGHT middle lobe and lingula. No activity associated with these nodules although they are small. Recommend follow-up CT and consider bronchoscopy for further evaluation. Electronically Signed   By: Suzy Bouchard M.D.   On: 12/14/2020 16:01     ASSESSMENT AND PLAN: This is a very pleasant 75 years old white female with suspicious carcinoid tumor of the duodenum as well as a small pulmonary nodules.  The patient has no clear pathology confirming her diagnosis but highly suspicious on the imaging studies. She had repeat PET dotatate scan on December 14, 2020 that showed focal radiotracer activity localized in the second portion of the duodenum corresponding to enhancing lesion on the comparison CT and consistent with a well-differentiated small neuroendocrine tumor.  There was no evidence of metastatic adenopathy in the abdomen and pelvis and no liver or mesenteric metastasis.  The patient had new bilateral pulmonary nodularity with discrete nodule in the right lung and peribronchial thickening and small nodularity in the right middle lobe and lingula but no activity associated with these lesions. She is scheduled to see Dr. Lamonte Sakai for consideration of repeat bronchoscopy and evaluation of these nodules. I had a lengthy discussion with the patient about her current condition and treatment options.  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 I would wait for the final pathology from Dr. Lamonte Sakai to see if the patient would require any additional treatment. For the history of pulmonary embolism, she will continue her current treatment with Xarelto. For the hypertension she has whitecoat syndrome and her blood pressure is usually normal at home. I will see the patient back for follow-up visit in 6 months for evaluation with repeat blood work. She was advised to call immediately if she has any other concerning symptoms in the interval. The patient voices understanding of current disease status and treatment options and is in agreement with the current care plan.  All questions were answered. The patient knows to call the clinic with any problems, questions or concerns. We can certainly see the patient much sooner if necessary. The total time spent in the appointment was 35 minutes.   Disclaimer: This note was dictated with voice recognition software. Similar sounding  words can inadvertently be transcribed and may not be corrected upon review.

## 2021-01-21 ENCOUNTER — Ambulatory Visit (INDEPENDENT_AMBULATORY_CARE_PROVIDER_SITE_OTHER): Payer: Medicare Other | Admitting: Emergency Medicine

## 2021-01-21 ENCOUNTER — Encounter: Payer: Self-pay | Admitting: Emergency Medicine

## 2021-01-21 ENCOUNTER — Other Ambulatory Visit: Payer: Self-pay

## 2021-01-21 DIAGNOSIS — R918 Other nonspecific abnormal finding of lung field: Secondary | ICD-10-CM

## 2021-01-21 DIAGNOSIS — J45909 Unspecified asthma, uncomplicated: Secondary | ICD-10-CM

## 2021-01-21 DIAGNOSIS — K219 Gastro-esophageal reflux disease without esophagitis: Secondary | ICD-10-CM

## 2021-01-21 DIAGNOSIS — J301 Allergic rhinitis due to pollen: Secondary | ICD-10-CM | POA: Diagnosis not present

## 2021-01-21 NOTE — Assessment & Plan Note (Signed)
She is experiencing wheezing but this sounds upper airway in nature based on her description, it got better when she started fluticasone nasal spray.  She needs pulmonary function testing to sort out whether she has any true obstruction.  We will try to treat her GERD (she does still have breakthrough), rhinitis.  Depending on PFT, symptoms we may need to consider changing her lisinopril

## 2021-01-21 NOTE — Assessment & Plan Note (Signed)
She still has breakthrough GERD on omeprazole 20 mg daily.  She does not want to increase this due to the risk to her renal function.

## 2021-01-21 NOTE — Progress Notes (Signed)
Subjective:    Patient ID: [name], female    DOB: 1945/04/21, 75 y.o.   MRN: [id number]  HPI 75 year old former smoker (10 pack years) who has been followed in our office for chronic bronchitis as well as pulmonary nodular disease on CT scan of the chest.  I performed bronchoscopy 03/14/2018 that was negative for malignancy.  Follow-up CT chest 08/15/2018 showed some improvement in appearance, consistent with an infectious/inflammatory process. She has a history of psoriasis and has been treated with Enbrel, also pulm embolism 2019.  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  This showed some new bilateral pulmonary nodularity with discrete nodules in the right upper lobe, middle lobe that were not PET avid of unclear significance. She is followed by Dr Julien Nordmann.   She describes today some noise that she hears at night some some whistling or squeaking, sometimes w exertion. Has cough, rarely productive, sometimes green/tan. Chronic nasal drainage and dependent on afrin - uses every day.    Review of Systems As per HPI  Past Medical History:  Diagnosis Date   ALLERGIC RHINITIS    ANEMIA-NOS    Arthritis    ASTHMA    Carpal tunnel syndrome    COLONIC POLYPS, HX OF    Coronary artery disease    "mild CAD" noted on 12/05/17 in coronary CT scan   DIABETES MELLITUS, TYPE II    diet controlled   GERD    Hand tingling    HYPERLIPIDEMIA    HYPERTENSION    Neuroendocrine tumor    OSTEOPENIA    PONV (postoperative nausea and vomiting)    " it relaxes my bladder muscles and I have been known to pee all over the place"   Psoriasis    severe, began soriatane 01/2012   Pulmonary embolism ([place])    Rectal fissure    Scoliosis    Wears glasses      Family History  Problem Relation Age of Onset   Lung cancer Father    Arthritis Other        Parents   Asthma Other        parent, other relative   Breast  cancer Other        other relative   Hypertension Other        parent, other relative   Heart disease Other        parent, other relative   Heart disease Mother    Asthma Mother    Breast cancer Maternal Aunt 68   Parkinson's disease Maternal Grandmother    Rheumatic fever Maternal Grandfather      Social History   Socioeconomic History   Marital status: Single    Spouse name: Not on file   Number of children: 1   Years of education: 16   Highest education level: Bachelor's degree (e.g., BA, AB, BS)  Occupational History   Occupation: retired Education officer, 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  Has one child and one grandchild.  Retired Careers adviser.  Education: college.    Social Determinants of Health   Financial Resource Strain: Low Risk    Difficulty of Paying Living Expenses: Not hard at all  Food Insecurity: No Food Insecurity   Worried About Charity fundraiser in the Last Year: Never true   [place] in the Last Year: Never true  Transportation Needs: No Transportation Needs   Lack of Transportation (Medical): No   Lack of Transportation (Non-Medical): No  Physical Activity: Inactive   Days of Exercise per Week: 0 days   Minutes of Exercise per Session: 0 min  Stress: No Stress Concern Present   Feeling of Stress : Not at all  Social Connections: Unknown   Frequency of Communication with Friends and Family: More than three times a week   Frequency of Social Gatherings with Friends and Family: Once a  week   Attends Religious Services: Patient refused   Active Member of Clubs or Organizations: Patient refused   Attends Archivist Meetings: Patient refused   Marital Status: Patient refused  Intimate Partner Violence: Not on file     Allergies  Allergen Reactions   Phenergan [Promethazine Hcl] Shortness Of Breath and Anxiety   Repatha [Evolocumab]     Muscle pain and weakness   Amlodipine Swelling   Clarithromycin Nausea And Vomiting    May have episodes of syncope due to prolonged vomiting    Codeine Nausea And Vomiting    May have episodes of syncope due to prolonged vomiting   Erythromycin Nausea And Vomiting   Hydralazine Hcl     Drug-induced lupus - pt wishes to avoid   Statins     Muscle pain    Tetracycline Nausea And Vomiting     Outpatient Medications Prior to Visit  Medication Sig Dispense Refill   ammonium lactate (LAC-HYDRIN) 12 % lotion APPLY TO ENTIRE BODY TWICE DAILY (Patient taking differently: Apply 1 application topically daily.) 396 g 11   apixaban (ELIQUIS) 5 MG TABS tablet Take 1 tablet (5 mg total) by mouth 2 (two) times daily. 60 tablet 5   Ascorbic Acid (VITAMIN C GUMMIE PO) Take 280 mg by mouth every evening. Each tablet 140 mg     calcium carbonate (TUMS EX) 750 MG chewable tablet Chew 750 mg by mouth 2 (two) times a week.     Cholecalciferol (VITAMIN D3 GUMMIES ADULT PO) Take 75 mcg by mouth every evening.     clindamycin (CLEOCIN T) 1 % external solution Apply 1 application topically daily as needed (wounds).      clobetasol (TEMOVATE) 0.05 % external solution APPLY TOPICALLY DAILY. TO AFFECTED AREAS (Patient taking differently: Apply 1 application topically daily.) 50 mL 5   Coenzyme Q10 (COQ10 GUMMIES ADULT PO) Take 200 mg by mouth in the morning and at bedtime.     COVID-19 mRNA bivalent vaccine, Pfizer, (PFIZER COVID-19 VAC BIVALENT) injection Inject into the muscle. 0.3 mL 0   CREON 36000-114000 units CPEP capsule Take 36,000 Units by  mouth 2 (two) times daily with a meal.     diclofenac Sodium (VOLTAREN) 1 % GEL Apply 2 g topically as needed. On hand     diphenhydrAMINE (BENADRYL) 12.5 MG chewable tablet Chew 12.5 mg by mouth at bedtime.     folic acid (FOLVITE) 1 MG  tablet TAKE 1 TABLET BY MOUTH EVERY DAY 30 tablet 5   glucose blood (ONETOUCH ULTRA) test strip USE TEST STRIPS TO CHECK BLOOD SUGAR DAILY AND AS NEEDED.  E11.9 300 strip 1   halobetasol (ULTRAVATE) 0.05 % cream APPLY TOPICALLY 2 TIMES DAILY. (Patient taking differently: Apply 1 application topically daily.) 50 g 5   icosapent Ethyl (VASCEPA) 1 g capsule Take 1 g by mouth 2 (two) times daily.     lisinopril (ZESTRIL) 10 MG tablet TAKE 1 TABLET BY MOUTH EVERY DAY 30 tablet 5   loratadine (CLARITIN) 10 MG tablet Take by mouth.     Multiple Vitamins-Minerals (ADULT GUMMY PO) Take 1 tablet by mouth every evening. Children [employer] gummie formula     nebivolol (BYSTOLIC) 2.5 MG tablet TAKE 1 TABLET BY MOUTH EVERY DAY *BRAND BYSTOLIC* 30 tablet 5   omeprazole (PRILOSEC OTC) 20 MG tablet Take 20 mg by mouth daily.     ondansetron (ZOFRAN-ODT) 8 MG disintegrating tablet Take 1 tablet (8 mg total) by mouth every 8 (eight) hours as needed. (Patient taking differently: Take 8 mg by mouth every 8 (eight) hours as needed for nausea.) 30 tablet 5   oxymetazoline (AFRIN) 0.05 % nasal spray Place 1 spray into both nostrils 2 (two) times daily.     polyethylene glycol (MIRALAX / GLYCOLAX) 17 g packet Take 17 g by mouth every 14 (fourteen) days.     rosuvastatin (CRESTOR) 5 MG tablet Take 5 mg by mouth 2 (two) times a week. Sometimes 3 times a week     vitamin B-12 (CYANOCOBALAMIN) 1000 MCG tablet Take 1,000 mcg by mouth daily.     No facility-administered medications prior to visit.         Objective:   Physical Exam  Vitals:   01/21/21 1539  BP: 134/78  Pulse: 81  Temp: 98.2 F (36.8 C)  TempSrc: Oral  SpO2: 97%  Weight: 150 lb 3.2 oz (68.1 kg)  Height: 5\' 1"   (1.549 m)   Gen: Pleasant, overweight woman, in no distress,  normal affect  ENT: No lesions,  mouth clear,  oropharynx clear, no postnasal drip  Neck: No JVD, no stridor  Lungs: No use of accessory muscles, no crackles or wheezing on normal respiration, no wheeze on forced expiration  Cardiovascular: RRR, heart sounds normal, no murmur or gallops, no peripheral edema  Musculoskeletal: No deformities, no cyanosis or clubbing  Neuro: alert, awake, non focal  Skin: Warm, no lesions or rash     Assessment & Plan:   Multiple pulmonary nodules determined by computed tomography of lung New pulmonary nodules noted on PET scan, etiology unclear.  She had nodules in the past that were biopsied and then receded in size and prominence, were negative.  Unclear whether this is the same process, new process.  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  She may ultimately require biopsy.  The nodules are small and I need to better characterize them with a repeat CT chest.  We can then talk about whether bronchoscopy is indicated.  Asthma She is experiencing wheezing but this sounds upper airway in nature based on her description, it got better when she started fluticasone nasal spray.  She needs pulmonary function testing to sort out whether she has any true obstruction.  We will try to treat her GERD (she does still have breakthrough), rhinitis.  Depending on PFT, symptoms we may need to consider changing her lisinopril  Allergic rhinitis  She uses Afrin every day, has rebounded and then requires it.  She is unsure that she can ever come off of it.  She started using fluticasone nasal spray with some benefit and her upper airway noise advised her to continue it.  GERD (gastroesophageal reflux disease) She still has breakthrough GERD on omeprazole 20 mg daily.  She does not want to increase this due to the risk to her renal function.  Time spent   Baltazar Apo, MD,  PhD 01/21/2021, 4:18 PM [facility] [phone] or if no answer before 7:00PM call [phone] For any issues after 7:00PM please call eLink [phone]

## 2021-01-21 NOTE — Assessment & Plan Note (Signed)
She uses Afrin every day, has rebounded and then requires it.  She is unsure that she can ever come off of it.  She started using fluticasone nasal spray with some benefit and her upper airway noise advised her to continue it.

## 2021-01-21 NOTE — Patient Instructions (Addendum)
We will plan to repeat your CT scan of the chest without contrast at the end of December 2022 to compare with priors.  Depending on the results we will discuss whether you may benefit from bronchoscopy. We will perform full pulmonary function testing in next office visit Continue fluticasone nasal spray (Flonase), 2 sprays each nostril once daily. Continue your omeprazole 20 mg once daily.  Take 1 hour around food. Continue your lisinopril for now.  Depending on how your cough and throat irritation due to we may need to consider adjusting this medication at some point in the future. Follow Dr. Lamonte Sakai with full pulmonary function testing after your CT scan so we can review all the testing together.

## 2021-01-21 NOTE — Assessment & Plan Note (Signed)
New pulmonary nodules noted on PET scan, etiology unclear.  She had nodules in the past that were biopsied and then receded in size and prominence, were negative.  Unclear whether this is the same process, new process.  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  She may ultimately require biopsy.  The nodules are small and I need to better characterize them with a repeat CT chest.  We can then talk about whether bronchoscopy is indicated.

## 2021-01-21 NOTE — Addendum Note (Signed)
Addended by: Gavin Potters R on: 01/21/2021 04:27 PM   Modules accepted: Orders

## 2021-02-12 ENCOUNTER — Ambulatory Visit (INDEPENDENT_AMBULATORY_CARE_PROVIDER_SITE_OTHER)
Admission: RE | Admit: 2021-02-12 | Discharge: 2021-02-12 | Disposition: A | Discharge status: Home / Self Care | Payer: Medicare Other | Source: Ambulatory Visit | Attending: Emergency Medicine | Admitting: Emergency Medicine

## 2021-02-12 ENCOUNTER — Other Ambulatory Visit: Payer: Self-pay

## 2021-02-12 DIAGNOSIS — R918 Other nonspecific abnormal finding of lung field: Secondary | ICD-10-CM

## 2021-02-19 ENCOUNTER — Other Ambulatory Visit (HOSPITAL_BASED_OUTPATIENT_CLINIC_OR_DEPARTMENT_OTHER): Payer: Self-pay

## 2021-02-19 MED FILL — Lactic Acid (Ammonium Lactate) Lotion 12%: CUTANEOUS | 7 days supply | Qty: 226 | Fill #1 | Status: CN

## 2021-03-02 ENCOUNTER — Telehealth: Payer: Self-pay | Admitting: Internal Medicine

## 2021-03-02 NOTE — Telephone Encounter (Signed)
N/A unable to leave a message for patient to call back to schedule Medicare Annual Wellness Visit   Last AWV  02/24/20  Please schedule at anytime with [facility] if patient calls the office back.    40 Minutes appointment   Any questions, please call me at [phone]

## 2021-03-02 NOTE — Telephone Encounter (Signed)
N/A unable to leave a message for patient to call back to schedule Medicare Annual Wellness Visit   Last AWV  02/24/20  Please schedule at anytime with [facility] if patient calls the office back.    40 Minutes appointment   Any questions, please call me at [phone]

## 2021-03-04 ENCOUNTER — Other Ambulatory Visit (HOSPITAL_BASED_OUTPATIENT_CLINIC_OR_DEPARTMENT_OTHER): Payer: Self-pay

## 2021-03-05 ENCOUNTER — Other Ambulatory Visit (HOSPITAL_BASED_OUTPATIENT_CLINIC_OR_DEPARTMENT_OTHER): Payer: Self-pay

## 2021-03-05 ENCOUNTER — Other Ambulatory Visit: Payer: Self-pay | Admitting: Internal Medicine

## 2021-03-05 MED FILL — Lactic Acid (Ammonium Lactate) Lotion 12%: CUTANEOUS | 7 days supply | Qty: 226 | Fill #1 | Status: CN

## 2021-03-10 ENCOUNTER — Ambulatory Visit (INDEPENDENT_AMBULATORY_CARE_PROVIDER_SITE_OTHER): Payer: Medicare Other | Admitting: Emergency Medicine

## 2021-03-10 ENCOUNTER — Encounter: Payer: Self-pay | Admitting: Emergency Medicine

## 2021-03-10 ENCOUNTER — Other Ambulatory Visit: Payer: Self-pay

## 2021-03-10 DIAGNOSIS — J45909 Unspecified asthma, uncomplicated: Secondary | ICD-10-CM

## 2021-03-10 DIAGNOSIS — R053 Chronic cough: Secondary | ICD-10-CM

## 2021-03-10 DIAGNOSIS — R918 Other nonspecific abnormal finding of lung field: Secondary | ICD-10-CM | POA: Diagnosis not present

## 2021-03-10 LAB — PULMONARY FUNCTION TEST
DL/VA % pred: 91 %
DL/VA: 3.85 ml/min/mmHg/L
DLCO cor % pred: 66 %
DLCO cor: 10.99 ml/min/mmHg
DLCO unc % pred: 66 %
DLCO unc: 10.99 ml/min/mmHg
FEF 25-75 Post: 2.05 L/sec
FEF 25-75 Pre: 1.92 L/sec
FEF2575-%Change-Post: 7 %
FEF2575-%Pred-Post: 153 %
FEF2575-%Pred-Pre: 143 %
FEV1-%Change-Post: 2 %
FEV1-%Pred-Post: 94 %
FEV1-%Pred-Pre: 91 %
FEV1-Post: 1.58 L
FEV1-Pre: 1.53 L
FEV1FVC-%Change-Post: 2 %
FEV1FVC-%Pred-Pre: 114 %
FEV6-%Change-Post: 0 %
FEV6-%Pred-Post: 84 %
FEV6-%Pred-Pre: 84 %
FEV6-Post: 1.81 L
FEV6-Pre: 1.79 L
FEV6FVC-%Pred-Post: 105 %
FEV6FVC-%Pred-Pre: 105 %
FVC-%Change-Post: 0 %
FVC-%Pred-Post: 79 %
FVC-%Pred-Pre: 79 %
FVC-Post: 1.81 L
FVC-Pre: 1.79 L
Post FEV1/FVC ratio: 87 %
Post FEV6/FVC ratio: 100 %
Pre FEV1/FVC ratio: 85 %
Pre FEV6/FVC Ratio: 100 %
RV % pred: 71 %
RV: 1.53 L
TLC % pred: 74 %
TLC: 3.32 L

## 2021-03-10 NOTE — Progress Notes (Signed)
PFT done today. 

## 2021-03-10 NOTE — Assessment & Plan Note (Signed)
No evidence for asthma on her pulmonary function testing.  Her symptoms are more consistent with upper airway irritation syndrome

## 2021-03-10 NOTE — Assessment & Plan Note (Addendum)
Contributors of rhinitis, not currently on therapy.  Also GERD.  She is on omeprazole.  She is on lisinopril but has not wanted to change this because she has failed other BP medications.  No evidence of obstructive lung disease on her PFT today.  I do not think we need to start bronchodilator therapy or ICS therapy

## 2021-03-10 NOTE — Progress Notes (Signed)
Subjective:    Patient ID: [name], female    DOB: 12-25-1945, 76 y.o.   MRN: [id number]  HPI 76 year old former smoker (10 pack years) who has been followed in our office for chronic bronchitis as well as pulmonary nodular disease on CT scan of the chest.  I performed bronchoscopy 03/14/2018 that was negative for malignancy.  Follow-up CT chest 08/15/2018 showed some improvement in appearance, consistent with an infectious/inflammatory process. She has a history of psoriasis and has been treated with Enbrel, also pulm embolism 2019.  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  This showed some new bilateral pulmonary nodularity with discrete nodules in the right upper lobe, middle lobe that were not PET avid of unclear significance. She is followed by Dr Julien Nordmann.   She describes today some noise that she hears at night some some whistling or squeaking, sometimes w exertion. Has cough, rarely productive, sometimes green/tan. Chronic nasal drainage and dependent on afrin - uses every day.    ROV 03/10/21 --follow-up visit 76 year old woman, former tobacco use has been followed in our office for chronic bronchitis and waxing and waning pulmonary nodular disease (negative biopsy 03/14/2018).  Also with a history of psoriasis previously on Enbrel, PE, neuroendocrine tumor of the duodenum. She had pulmonary nodular disease seen on PET scan 11/2018 of unclear significance, underwent repeat CT imaging on 02/12/21 to compare with 2020.  I saw her in December she was experiencing upper airway symptoms in the setting of allergic rhinitis.  I added GERD therapy, on omeprazole bid.  Of note she is on lisinopril.  Pulmonary function testing performed today and reviewed by me, shows spirometry most consistent with restrictive lung disease, no clear obstruction and no bronchodilator response.  Her lung volumes are restricted.  She has a  decreased diffusion capacity that corrects to the normal range when adjusted for alveolar volume.  Super D CT chest 02/12/2021 reviewed by me, shows scattered cylindrical bronchiectatic change bilaterally with some peribronchovascular micro macro nodularity similar to prior going back to 08/2018, largest in the left lower lobe 2.4 x 1.4 cm question mucoid impaction.  Question MAC   Review of Systems As per HPI  Past Medical History:  Diagnosis Date   ALLERGIC RHINITIS    ANEMIA-NOS    Arthritis    ASTHMA    Carpal tunnel syndrome    COLONIC POLYPS, HX OF    Coronary artery disease    "mild CAD" noted on 12/05/17 in coronary CT scan   DIABETES MELLITUS, TYPE II    diet controlled   GERD    Hand tingling    HYPERLIPIDEMIA    HYPERTENSION    Neuroendocrine tumor    OSTEOPENIA    PONV (postoperative nausea and vomiting)    " it relaxes my bladder muscles and I have been known to pee all over the place"   Psoriasis    severe, began soriatane 01/2012   Pulmonary embolism ([place])    Rectal fissure    Scoliosis    Wears glasses      Family History  Problem Relation Age of Onset   Lung cancer Father    Arthritis Other        Parents   Asthma Other        parent, other relative   Breast cancer Other        other relative   Hypertension Other  parent, other relative   Heart disease Other        parent, other relative   Heart disease Mother    Asthma Mother    Breast cancer Maternal Aunt 68   Parkinson's disease Maternal Grandmother    Rheumatic fever Maternal Grandfather      Social History   Socioeconomic History   Marital status: Single    Spouse name: Not on file   Number of children: 1   Years of education: 16   Highest education level: Bachelor's degree (e.g., BA, AB, BS)  Occupational History   Occupation: retired Education officer, 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  Has one child and one grandchild.  Retired Careers adviser.  Education: college.    Social Determinants of Health   Financial Resource Strain: Not on file  Food Insecurity: Not on file  Transportation Needs: Not on file  Physical Activity: Not on file  Stress: Not on file  Social Connections: Not on file  Intimate Partner Violence: Not on file     Allergies  Allergen Reactions   Phenergan [Promethazine Hcl] Shortness Of Breath and Anxiety   Repatha [Evolocumab]     Muscle pain and weakness   Amlodipine Swelling   Clarithromycin Nausea And Vomiting    May have episodes of syncope due to prolonged vomiting    Codeine Nausea And Vomiting    May have episodes of syncope due to prolonged vomiting   Erythromycin Nausea And Vomiting   Hydralazine Hcl     Drug-induced lupus - pt wishes to avoid   Statins     Muscle pain    Tetracycline Nausea And Vomiting     Outpatient Medications Prior to Visit  Medication Sig Dispense Refill   apixaban (ELIQUIS) 5 MG TABS tablet Take 1 tablet (5 mg total) by mouth 2 (two) times daily. 60 tablet 5   Ascorbic Acid (VITAMIN C GUMMIE PO) Take 280 mg by mouth every evening. Each tablet 140 mg     calcium carbonate (TUMS EX) 750 MG chewable tablet Chew 750 mg by mouth 2 (two) times a week.     Cholecalciferol (VITAMIN D3 GUMMIES ADULT PO) Take 75 mcg by mouth every evening.     clindamycin (CLEOCIN T) 1 % external solution Apply 1 application topically daily as needed (wounds).      Coenzyme Q10 (COQ10 GUMMIES ADULT PO) Take 200 mg by mouth in the morning and at bedtime.     CREON  36000-114000 units CPEP capsule Take 36,000 Units by mouth 2 (two) times daily with a meal.     diclofenac Sodium (VOLTAREN) 1 % GEL Apply 2 g topically as needed. On hand     diphenhydrAMINE (BENADRYL) 12.5 MG chewable tablet Chew 12.5 mg by mouth at bedtime.     folic acid (FOLVITE) 1 MG tablet TAKE 1 TABLET BY MOUTH EVERY DAY 30 tablet 5  icosapent Ethyl (VASCEPA) 1 g capsule Take 1 g by mouth 2 (two) times daily.     lisinopril (ZESTRIL) 10 MG tablet TAKE 1 TABLET BY MOUTH EVERY DAY 30 tablet 5   Multiple Vitamins-Minerals (ADULT GUMMY PO) Take 1 tablet by mouth every evening. Children [employer] gummie formula     nebivolol (BYSTOLIC) 2.5 MG tablet TAKE 1 TABLET BY MOUTH EVERY DAY *BRAND BYSTOLIC* 30 tablet 5   omeprazole (PRILOSEC OTC) 20 MG tablet Take 20 mg by mouth daily.     ondansetron (ZOFRAN-ODT) 8 MG disintegrating tablet Take 1 tablet (8 mg total) by mouth every 8 (eight) hours as needed. (Patient taking differently: Take 8 mg by mouth every 8 (eight) hours as needed for nausea.) 30 tablet 5   ONETOUCH ULTRA test strip USE TEST STRIPS TO CHECK BLOOD SUGAR DAILY AND AS NEEDED. E11.9 300 strip 1   oxymetazoline (AFRIN) 0.05 % nasal spray Place 1 spray into both nostrils 2 (two) times daily.     polyethylene glycol (MIRALAX / GLYCOLAX) 17 g packet Take 17 g by mouth every 14 (fourteen) days.     rosuvastatin (CRESTOR) 5 MG tablet Take 5 mg by mouth 2 (two) times a week. Sometimes 3 times a week     vitamin B-12 (CYANOCOBALAMIN) 1000 MCG tablet Take 1,000 mcg by mouth daily.     COVID-19 mRNA bivalent vaccine, Pfizer, (PFIZER COVID-19 VAC BIVALENT) injection Inject into the muscle. (Patient not taking: Reported on 03/10/2021) 0.3 mL 0   loratadine (CLARITIN) 10 MG tablet Take by mouth. (Patient not taking: Reported on 03/10/2021)     No facility-administered medications prior to visit.         Objective:   Physical Exam  Vitals:   03/10/21 1204  BP: (!) 146/82  Pulse: 83   Temp: 98.1 F (36.7 C)  TempSrc: Oral  SpO2: 97%  Weight: 147 lb 3.2 oz (66.8 kg)  Height: 5' (1.524 m)   Gen: Pleasant, overweight woman, in no distress,  normal affect  ENT: No lesions,  mouth clear,  oropharynx clear, no postnasal drip  Neck: No JVD, no stridor  Lungs: No use of accessory muscles, no crackles or wheezing on normal respiration, no wheeze on forced expiration  Cardiovascular: RRR, heart sounds normal, no murmur or gallops, no peripheral edema  Musculoskeletal: No deformities, no cyanosis or clubbing  Neuro: alert, awake, non focal  Skin: Warm, no lesions or rash     Assessment & Plan:   Multiple pulmonary nodules determined by computed tomography of lung Stable on repeat CT compared with 2020.  Biopsy negative by bronchoscopy at that time.  Plan to repeat her CT at the 1 year mark to ensure no interval change.  If she develops worsening cough, respiratory symptoms we could consider possible repeat bronchoscopy to evaluate for [facility].  Asthma No evidence for asthma on her pulmonary function testing.  Her symptoms are more consistent with upper airway irritation syndrome  Chronic cough Contributors of rhinitis, not currently on therapy.  Also GERD.  She is on omeprazole.  She is on lisinopril but has not wanted to change this because she has failed other BP medications.  No evidence of obstructive lung disease on her PFT today.  I do not think we need to start bronchodilator therapy or ICS therapy  Time spent 35 minutes  Baltazar Apo, MD, PhD 03/10/2021, 12:49 PM [facility] [phone] or if no answer before 7:00PM call [phone] For any  issues after 7:00PM please call eLink [phone]

## 2021-03-10 NOTE — Patient Instructions (Addendum)
We will plan to repeat your CT chest in 01/2023 to follow pulmonary nodules.  We reviewed your pulmonary function testing today.  Based on these tests I do not believe we need to start any asthma therapy. Please continue your omeprazole as you have been taking it. Cough and wheeze progress going forward then you may need to speak with Dr. Quay Burow, reconsider changing your lisinopril to an alternative.  We plan to continue it for now. Follow with Dr Lamonte Sakai in December after your CT scan of the chest to review.  Call sooner if you have any problems.

## 2021-03-10 NOTE — Assessment & Plan Note (Signed)
Stable on repeat CT compared with 2020.  Biopsy negative by bronchoscopy at that time.  Plan to repeat her CT at the 1 year mark to ensure no interval change.  If she develops worsening cough, respiratory symptoms we could consider possible repeat bronchoscopy to evaluate for [facility].

## 2021-03-10 NOTE — Addendum Note (Signed)
Addended by: Gavin Potters R on: 03/10/2021 01:37 PM   Modules accepted: Orders

## 2021-03-22 ENCOUNTER — Encounter: Payer: Self-pay | Admitting: Internal Medicine

## 2021-03-22 NOTE — Progress Notes (Signed)
Subjective:    Patient ID: [name], female    DOB: [birth year], 76 y.o.   MRN: [id number]  This visit occurred during the SARS-CoV-2 public health emergency.  Safety protocols were in place, including screening questions prior to the visit, additional usage of staff PPE, and extensive cleaning of exam room while observing appropriate contact time as indicated for disinfecting solutions.     HPI The patient is here for follow up of their chronic medical problems, including htn, DM, hld, ckd, leg edema, vit d def, h/o PE, pancreatic insuff  BP at home controlled 111/68, 129/74, 138/89, 109/74, 138/85   Lots of GI symptoms - painful  --- he thinks it is sluggish colon.  He advised miralax.  She has severe pain in central to LUQ area.  She feels like she has huge amt of gas.  Gax-ex helps.  She takes the simethicone.  She sometimes has pain in her right shoulder.  She is not taking the miralax daily due to it causing diarrhea - she stopped taking it.    She has not been compliant with a diabetic diet.  She is not exercising.    Medications and allergies reviewed with patient and updated if appropriate.  Patient Active Problem List   Diagnosis Date Noted   Chronic cough 03/10/2021   Weight gain 11/11/2020   Hypervitaminosis E 11/11/2020   Malabsorption 06/23/2020   Fatty (change of) liver, not elsewhere classified 04/16/2020   CKD (chronic kidney disease) stage 3, GFR 30-59 ml/min (HCC) - Dr Harrie Jeans, [facility] 02/24/2020   Drug-induced systemic lupus erythematosus ([place]) 02/24/2020   Aortic atherosclerosis ([place]) 54/62/7035   Folic acid deficiency 00/93/8182   Bilateral leg edema 03/19/2019   Hemorrhoids 03/19/2019   B12 deficiency 03/19/2019   Abnormal endoscopy of upper gastrointestinal tract 02/27/2019   Iron deficiency anemia 02/19/2019   Hypomagnesemia    Inflammatory arthritis - Dr Baxter Flattery 01/02/2019   Positive ANA (antinuclear antibody) 01/02/2019   Exocrine  pancreatic insufficiency - Dr Earlean Shawl 10/23/2018   Multiple pulmonary nodules determined by computed tomography of lung 09/18/2018   Syncope 08/01/2018   Pneumothorax 03/14/2018   History of pulmonary embolism 12/24/2017   Small vessel disease, cerebrovascular 07/31/2017   Hyperuricemia 05/03/2017   Arthralgia 05/01/2017   Hair loss 05/01/2017   Vitamin D deficiency 99/37/1696   Diastolic dysfunction 78/93/8101   Vertigo 12/24/2015   Fatigue 12/24/2015   Bilateral carotid artery disease, Mild 05/31/2015   Thyroid nodule 05/31/2015   Psoriasis    Scoliosis    Diabetes type 2, controlled ([place]) 06/03/2008   Dyslipidemia 06/03/2008   CARPAL TUNNEL SYNDROME 06/03/2008   HTN (hypertension) 06/03/2008   Allergic rhinitis 06/03/2008   Asthma 06/03/2008   GERD (gastroesophageal reflux disease) 06/03/2008   Osteopenia 06/03/2008   COLONIC POLYPS, HX OF 06/03/2008    Current Outpatient Medications on File Prior to Visit  Medication Sig Dispense Refill   apixaban (ELIQUIS) 5 MG TABS tablet Take 1 tablet (5 mg total) by mouth 2 (two) times daily. 60 tablet 5   Ascorbic Acid (VITAMIN C GUMMIE PO) Take 280 mg by mouth every evening. Each tablet 140 mg     calcium carbonate (TUMS EX) 750 MG chewable tablet Chew 750 mg by mouth daily.     Cholecalciferol (VITAMIN D3 GUMMIES ADULT PO) Take 75 mcg by mouth every evening.     clindamycin (CLEOCIN T) 1 % external solution Apply 1 application topically daily as needed (wounds).  Coenzyme Q10 (COQ10 GUMMIES ADULT PO) Take 400 mg by mouth in the morning and at bedtime.     CREON 36000-114000 units CPEP capsule Take 36,000 Units by mouth 2 (two) times daily with a meal.     diphenhydrAMINE (BENADRYL) 12.5 MG chewable tablet Chew 12.5 mg by mouth at bedtime.     folic acid (FOLVITE) 1 MG tablet TAKE 1 TABLET BY MOUTH EVERY DAY 30 tablet 5   halobetasol (ULTRAVATE) 0.05 % cream APPLY TO AFFECTED AREA TWICE A DAY     icosapent Ethyl (VASCEPA) 1 g  capsule Take 1 g by mouth 2 (two) times daily.     lisinopril (ZESTRIL) 10 MG tablet TAKE 1 TABLET BY MOUTH EVERY DAY 30 tablet 5   Multiple Vitamins-Minerals (ADULT GUMMY PO) Take 1 tablet by mouth every evening. Children [employer] gummie formula     nebivolol (BYSTOLIC) 2.5 MG tablet TAKE 1 TABLET BY MOUTH EVERY DAY *BRAND BYSTOLIC* 30 tablet 5   omeprazole (PRILOSEC OTC) 20 MG tablet Take 20 mg by mouth daily.     ondansetron (ZOFRAN-ODT) 8 MG disintegrating tablet Take 1 tablet (8 mg total) by mouth every 8 (eight) hours as needed. (Patient taking differently: Take 8 mg by mouth every 8 (eight) hours as needed for nausea.) 30 tablet 5   ONETOUCH ULTRA test strip USE TEST STRIPS TO CHECK BLOOD SUGAR DAILY AND AS NEEDED. E11.9 300 strip 1   oxymetazoline (AFRIN) 0.05 % nasal spray Place 1 spray into both nostrils 2 (two) times daily.     polyethylene glycol (MIRALAX / GLYCOLAX) 17 g packet Take 17 g by mouth every 14 (fourteen) days.     rosuvastatin (CRESTOR) 5 MG tablet Take 5 mg by mouth 2 (two) times a week. Sometimes 3 times a week     Simethicone (GAS-X PO) Take 250 mg by mouth daily.     vitamin B-12 (CYANOCOBALAMIN) 1000 MCG tablet Take 1,000 mcg by mouth daily.     No current facility-administered medications on file prior to visit.    Past Medical History:  Diagnosis Date   ALLERGIC RHINITIS    ANEMIA-NOS    Arthritis    ASTHMA    Carpal tunnel syndrome    COLONIC POLYPS, HX OF    Coronary artery disease    "mild CAD" noted on 12/05/17 in coronary CT scan   DIABETES MELLITUS, TYPE II    diet controlled   GERD    Hand tingling    HYPERLIPIDEMIA    HYPERTENSION    Neuroendocrine tumor    OSTEOPENIA    PONV (postoperative nausea and vomiting)    " it relaxes my bladder muscles and I have been known to pee all over the place"   Psoriasis    severe, began soriatane 01/2012   Pulmonary embolism ([place])    Rectal fissure    Scoliosis    Wears glasses     Past Surgical  History:  Procedure Laterality Date   benign rectal growth  2004   removed by Dr. Zella Richer   BIOPSY  08/09/2018   Procedure: BIOPSY;  Surgeon: Carol Ada, MD;  Location: [place];  Service: Endoscopy;;   BIOPSY  08/22/2018   Procedure: BIOPSY;  Surgeon: Juanita Craver, MD;  Location: [place];  Service: Endoscopy;;   BIOPSY  03/07/2019   Procedure: BIOPSY;  Surgeon: Irving Copas., MD;  Location: [place];  Service: Gastroenterology;;   BIOPSY  08/06/2020   Procedure: BIOPSY;  Surgeon:  Mansouraty, Telford Nab., MD;  Location: [place];  Service: Gastroenterology;;   BREAST BIOPSY Right    BREAST EXCISIONAL BIOPSY Left    BREAST SURGERY  1988   biopsy   CESAREAN SECTION     COLONOSCOPY N/A 08/22/2018   Procedure: COLONOSCOPY;  Surgeon: Juanita Craver, MD;  Location: [facility] ENDOSCOPY;  Service: Endoscopy;  Laterality: N/A;   ENDOSCOPIC MUCOSAL RESECTION N/A 03/07/2019   Procedure: ENDOSCOPIC MUCOSAL RESECTION;  Surgeon: Rush Landmark Telford Nab., MD;  Location: [place];  Service: Gastroenterology;  Laterality: N/A;   ESOPHAGOGASTRODUODENOSCOPY (EGD) WITH PROPOFOL N/A 08/04/2018   Procedure: ESOPHAGOGASTRODUODENOSCOPY (EGD) WITH PROPOFOL;  Surgeon: Ladene Artist, MD;  Location: [place];  Service: Gastroenterology;  Laterality: N/A;   ESOPHAGOGASTRODUODENOSCOPY (EGD) WITH PROPOFOL N/A 03/07/2019   Procedure: ESOPHAGOGASTRODUODENOSCOPY (EGD) WITH PROPOFOL;  Surgeon: Rush Landmark Telford Nab., MD;  Location: [place];  Service: Gastroenterology;  Laterality: N/A;   ESOPHAGOGASTRODUODENOSCOPY (EGD) WITH PROPOFOL N/A 08/06/2020   Procedure: ESOPHAGOGASTRODUODENOSCOPY (EGD) WITH PROPOFOL;  Surgeon: Rush Landmark Telford Nab., MD;  Location: [place];  Service: Gastroenterology;  Laterality: N/A;   EUS N/A 03/07/2019   Procedure: UPPER ENDOSCOPIC ULTRASOUND (EUS) RADIAL;  Surgeon: Irving Copas., MD;  Location: [place];  Service: Gastroenterology;  Laterality:  N/A;   EUS N/A 08/06/2020   Procedure: UPPER ENDOSCOPIC ULTRASOUND (EUS) RADIAL;  Surgeon: Irving Copas., MD;  Location: [place];  Service: Gastroenterology;  Laterality: N/A;   FINE NEEDLE ASPIRATION  03/07/2019   Procedure: FINE NEEDLE ASPIRATION (FNA) LINEAR;  Surgeon: Irving Copas., MD;  Location: [place];  Service: Gastroenterology;;   FINE NEEDLE ASPIRATION  08/06/2020   Procedure: FINE NEEDLE ASPIRATION;  Surgeon: Irving Copas., MD;  Location: [place];  Service: Gastroenterology;;   Otho Darner SIGMOIDOSCOPY N/A 08/04/2018   Procedure: Beryle Quant;  Surgeon: Ladene Artist, MD;  Location: [place];  Service: Gastroenterology;  Laterality: N/A;   FLEXIBLE SIGMOIDOSCOPY N/A 08/09/2018   Procedure: FLEXIBLE SIGMOIDOSCOPY;  Surgeon: Carol Ada, MD;  Location: [place];  Service: Endoscopy;  Laterality: N/A;   HEMOSTASIS CLIP PLACEMENT  03/07/2019   Procedure: HEMOSTASIS CLIP PLACEMENT;  Surgeon: Irving Copas., MD;  Location: [place];  Service: Gastroenterology;;   SUBMUCOSAL LIFTING INJECTION  03/07/2019   Procedure: SUBMUCOSAL LIFTING INJECTION;  Surgeon: Irving Copas., MD;  Location: [place];  Service: Gastroenterology;;   VIDEO BRONCHOSCOPY WITH ENDOBRONCHIAL NAVIGATION N/A 03/14/2018   Procedure: VIDEO BRONCHOSCOPY WITH ENDOBRONCHIAL NAVIGATION;  Surgeon: Collene Gobble, MD;  [facility];  Service: Thoracic;  Laterality: N/A;    Social History   Socioeconomic History   Marital status: Single    Spouse name: Not on file   Number of children: 1   Years of education: 16   Highest education level: Bachelor's degree (e.g., BA, AB, BS)  Occupational History   Occupation: retired Education officer, 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  Has one child and  one grandchild.  Retired Careers adviser.  Education: college.    Social Determinants of Health   Financial Resource Strain: Not on file  Food Insecurity: Not on file  Transportation Needs: Not on file  Physical Activity: Not on file  Stress: Not on file  Social Connections: Not on file    Family History  Problem Relation Age of Onset   Lung cancer Father    Arthritis Other        Parents   Asthma Other        parent, other relative   Breast cancer Other        other relative   Hypertension Other        parent, other relative   Heart disease Other        parent, other relative   Heart disease Mother    Asthma Mother    Breast cancer Maternal Aunt 68   Parkinson's disease Maternal Grandmother    Rheumatic fever Maternal Grandfather     Review of Systems  Constitutional:  Positive for fatigue. Negative for fever.  Respiratory:  Positive for cough (? lisinopril), shortness of breath and wheezing (occ - none recently).   Cardiovascular:  Positive for chest pain (related to gas / GI issues) and leg swelling (chronic LLE). Negative for palpitations.  Gastrointestinal:  Positive for abdominal pain, constipation and nausea (worse with food).       Gerd  Neurological:  Positive for headaches (rare).  Psychiatric/Behavioral:  Positive for dysphoric mood (occ). The patient is nervous/anxious (occ).       Objective:   Vitals:   03/23/21 0907  BP: 128/86  Pulse: 62  Temp: 98 F (36.7 C)  SpO2: 98%   BP Readings from Last 3 Encounters:  03/23/21 128/86  03/10/21 (!) 146/82  01/21/21 134/78   Wt Readings from Last 3 Encounters:  03/23/21 146 lb 11.2 oz (66.5 kg)   03/10/21 147 lb 3.2 oz (66.8 kg)  01/21/21 150 lb 3.2 oz (68.1 kg)   Body mass index is 28.65 kg/m.   Physical Exam    Constitutional: Appears well-developed and well-nourished. No distress.  HENT:  Head: Normocephalic and atraumatic.  Neck: Neck supple. No tracheal deviation present. No thyromegaly present.  No cervical lymphadenopathy Cardiovascular: Normal rate, regular rhythm and normal heart sounds.   No murmur heard. No carotid bruit .  No edema Pulmonary/Chest: Effort normal and breath sounds normal. No respiratory distress. No has no wheezes. No rales.  Skin: Skin is warm and dry. Not diaphoretic.  Psychiatric: Normal mood and affect. Behavior is normal.      Assessment & Plan:    See Problem List for Assessment and Plan of chronic medical problems.

## 2021-03-22 NOTE — Patient Instructions (Addendum)
    Blood work was ordered.     Medications changes include :  none   Your prescription(s) have been submitted to your pharmacy. Please take as directed and contact our office if you believe you are having problem(s) with the medication(s).   Please followup in 6 months  

## 2021-03-23 ENCOUNTER — Other Ambulatory Visit: Payer: Self-pay | Admitting: Internal Medicine

## 2021-03-23 ENCOUNTER — Other Ambulatory Visit: Payer: Self-pay

## 2021-03-23 ENCOUNTER — Ambulatory Visit (INDEPENDENT_AMBULATORY_CARE_PROVIDER_SITE_OTHER): Payer: Medicare Other | Admitting: Internal Medicine

## 2021-03-23 VITALS — BP 128/86 | HR 62 | Temp 98.0°F | Ht 60.0 in | Wt 146.7 lb

## 2021-03-23 DIAGNOSIS — I10 Essential (primary) hypertension: Secondary | ICD-10-CM

## 2021-03-23 DIAGNOSIS — E785 Hyperlipidemia, unspecified: Secondary | ICD-10-CM

## 2021-03-23 DIAGNOSIS — E559 Vitamin D deficiency, unspecified: Secondary | ICD-10-CM

## 2021-03-23 DIAGNOSIS — E118 Type 2 diabetes mellitus with unspecified complications: Secondary | ICD-10-CM

## 2021-03-23 DIAGNOSIS — N1831 Chronic kidney disease, stage 3a: Secondary | ICD-10-CM

## 2021-03-23 DIAGNOSIS — D508 Other iron deficiency anemias: Secondary | ICD-10-CM

## 2021-03-23 DIAGNOSIS — R110 Nausea: Secondary | ICD-10-CM

## 2021-03-23 DIAGNOSIS — K219 Gastro-esophageal reflux disease without esophagitis: Secondary | ICD-10-CM

## 2021-03-23 DIAGNOSIS — Z1231 Encounter for screening mammogram for malignant neoplasm of breast: Secondary | ICD-10-CM

## 2021-03-23 DIAGNOSIS — Z86711 Personal history of pulmonary embolism: Secondary | ICD-10-CM

## 2021-03-23 DIAGNOSIS — K5909 Other constipation: Secondary | ICD-10-CM

## 2021-03-23 DIAGNOSIS — K8681 Exocrine pancreatic insufficiency: Secondary | ICD-10-CM

## 2021-03-23 LAB — CBC WITH DIFFERENTIAL/PLATELET
Basophils Absolute: 0.1 10*3/uL (ref 0.0–0.1)
Basophils Relative: 1.5 % (ref 0.0–3.0)
Eosinophils Absolute: 0.3 10*3/uL (ref 0.0–0.7)
Eosinophils Relative: 4 % (ref 0.0–5.0)
HCT: 40.3 % (ref 36.0–46.0)
Hemoglobin: 13 g/dL (ref 12.0–15.0)
Lymphocytes Relative: 23.5 % (ref 12.0–46.0)
Lymphs Abs: 1.9 10*3/uL (ref 0.7–4.0)
MCHC: 32.4 g/dL (ref 30.0–36.0)
MCV: 90.8 fl (ref 78.0–100.0)
Monocytes Absolute: 0.5 10*3/uL (ref 0.1–1.0)
Monocytes Relative: 6.2 % (ref 3.0–12.0)
Neutro Abs: 5.2 10*3/uL (ref 1.4–7.7)
Neutrophils Relative %: 64.8 % (ref 43.0–77.0)
Platelets: 280 10*3/uL (ref 150.0–400.0)
RBC: 4.44 Mil/uL (ref 3.87–5.11)
RDW: 15.8 % — ABNORMAL HIGH (ref 11.5–15.5)
WBC: 8.1 10*3/uL (ref 4.0–10.5)

## 2021-03-23 LAB — LIPID PANEL
Cholesterol: 196 mg/dL (ref 0–200)
HDL: 52.3 mg/dL (ref >39.00)
LDL Cholesterol: 109 mg/dL — ABNORMAL HIGH (ref 0–99)
NonHDL: 144.11
Total CHOL/HDL Ratio: 4
Triglycerides: 176 mg/dL — ABNORMAL HIGH (ref 0.0–149.0)
VLDL: 35.2 mg/dL (ref 0.0–40.0)

## 2021-03-23 LAB — COMPREHENSIVE METABOLIC PANEL
ALT: 18 U/L (ref 0–35)
AST: 16 U/L (ref 0–37)
Albumin: 4.2 g/dL (ref 3.5–5.2)
Alkaline Phosphatase: 76 U/L (ref 39–117)
BUN: 15 mg/dL (ref 6–23)
CO2: 32 mEq/L (ref 19–32)
Calcium: 9.8 mg/dL (ref 8.4–10.5)
Chloride: 102 mEq/L (ref 96–112)
Creatinine, Ser: 1.11 mg/dL (ref 0.40–1.20)
GFR: 47.98 mL/min — ABNORMAL LOW (ref >60.00)
Glucose, Bld: 114 mg/dL — ABNORMAL HIGH (ref 70–99)
Potassium: 4.4 mEq/L (ref 3.5–5.1)
Sodium: 140 mEq/L (ref 135–145)
Total Bilirubin: 0.7 mg/dL (ref 0.2–1.2)
Total Protein: 7.5 g/dL (ref 6.0–8.3)

## 2021-03-23 LAB — HEMOGLOBIN A1C: Hgb A1c MFr Bld: 6.7 % — ABNORMAL HIGH (ref 4.6–6.5)

## 2021-03-23 MED ORDER — APIXABAN 5 MG PO TABS: 5.0000 mg | ORAL_TABLET | Freq: Two times a day (BID) | ORAL | 2 refills | Status: DC

## 2021-03-23 MED ORDER — ONDANSETRON 8 MG PO TBDP: 8.0000 mg | ORAL_TABLET | Freq: Three times a day (TID) | ORAL | 2 refills | Status: DC | PRN

## 2021-03-23 NOTE — Assessment & Plan Note (Signed)
Chronic Intermittent Continue Zofran as needed Possibly related to GERD, constipation, pancreatic insufficiency Following with GI

## 2021-03-23 NOTE — Assessment & Plan Note (Addendum)
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

## 2021-03-23 NOTE — Assessment & Plan Note (Signed)
Chronic Regular exercise and healthy diet encouraged Check lipid panel  Continue rosuvastatin 5 mg twice weekly

## 2021-03-23 NOTE — Assessment & Plan Note (Addendum)
Chronic Lab Results  Component Value Date   HGBA1C 6.9 (H) 11/11/2020    Controlled with lifestyle Check A1c She has not been compliant with a diabetic diet recently and is not exercising-stressed regular exercise and low carbohydrate/sugar diet

## 2021-03-23 NOTE — Assessment & Plan Note (Addendum)
Chronic GERD?  Controlled Following with GI Continue omeprazole 20 mg OTC daily

## 2021-03-23 NOTE — Assessment & Plan Note (Signed)
Chronic Continue vitamin D daily

## 2021-03-23 NOTE — Assessment & Plan Note (Signed)
Chronic Following with nephrology She does not take any NSAIDs Stressed good water intake

## 2021-03-23 NOTE — Assessment & Plan Note (Signed)
Chronic Blood pressure well controlled CMP Continue lisinopril 10 mg daily, Bystolic 2.5 mg daily

## 2021-03-23 NOTE — Assessment & Plan Note (Signed)
Chronic Following with GI-they have advised MiraLAX daily, but that gave her diarrhea and she stopped it Has follow-up with GI

## 2021-03-23 NOTE — Assessment & Plan Note (Signed)
Chronic Controlled Management per GI On [place]

## 2021-03-23 NOTE — Assessment & Plan Note (Signed)
History of iron deficiency anemia Check CBC

## 2021-03-30 ENCOUNTER — Ambulatory Visit
Admission: RE | Admit: 2021-03-30 | Discharge: 2021-03-30 | Disposition: A | Discharge status: Home / Self Care | Payer: Medicare Other | Source: Ambulatory Visit | Attending: Internal Medicine | Admitting: Internal Medicine

## 2021-03-30 DIAGNOSIS — Z1231 Encounter for screening mammogram for malignant neoplasm of breast: Secondary | ICD-10-CM

## 2021-03-30 HISTORY — DX: Nipple discharge: N64.52

## 2021-04-19 ENCOUNTER — Other Ambulatory Visit: Payer: Self-pay | Admitting: Internal Medicine

## 2021-04-30 NOTE — Progress Notes (Deleted)
?Cardiology Office Note:   ? ?Date:  04/30/2021  ? ?ID:  [name], DOB 09/02/1945, MRN [id number] ? ?PCP:  [name], MD ?  ?[facility] Providers ?Cardiologist:  Freada Bergeron, MD { ? ? ?Referring MD: [name], MD  ? ? ?History of Present Illness:   ? ?[name] is a 76 y.o. female with a hx of difficult to control hypertension, hyperlipidemia, mild nonobstructive CAD, PE, aortic atherosclerosis who was previously followed by Dr. [name] who now returns to clinic for follow-up. ? ?Per review of the record, Cardiac CTA 11/2017 with mild nonobstructive CAD. She has had difficulty with lipid control. Intolerant to pravastatin, atorvastatin, pitavastatin. She reports Repatha caused "paralysis". She has concerns about fish oil and bleeding. She was last seen 04/02/20 and referred to pharmacy. Crestor 5mg  twice per week was initiated. ? ?Was last seen by Laurann Montana where she was only tolerating crestor 2x/week due to myalgias. She was also reducing her dose of vascepa due to concern for bleed. Otherwise, she was stable from a CV standpoint.  ? ?Today, *** ? ?Past Medical History:  ?Diagnosis Date  ? ALLERGIC RHINITIS   ? ANEMIA-NOS   ? Arthritis   ? ASTHMA   ? Breast discharge   ? Carpal tunnel syndrome   ? COLONIC POLYPS, HX OF   ? Coronary artery disease   ? "mild CAD" noted on 12/05/17 in coronary CT scan  ? DIABETES MELLITUS, TYPE II   ? diet controlled  ? GERD   ? Hand tingling   ? HYPERLIPIDEMIA   ? HYPERTENSION   ? Neuroendocrine tumor   ? OSTEOPENIA   ? PONV (postoperative nausea and vomiting)   ? " it relaxes my bladder muscles and I have been known to pee all over the place"  ? Psoriasis   ? severe, began soriatane 01/2012  ? Pulmonary embolism ([place])   ? Rectal fissure   ? Scoliosis   ? Wears glasses   ? ? ?Past Surgical History:  ?Procedure Laterality Date  ? benign rectal growth  2004  ? removed by Dr. Zella Richer  ? BIOPSY  08/09/2018  ? Procedure: BIOPSY;  Surgeon:  Carol Ada, MD;  Location: [place];  Service: Endoscopy;;  ? BIOPSY  08/22/2018  ? Procedure: BIOPSY;  Surgeon: Juanita Craver, MD;  Location: [facility] ENDOSCOPY;  Service: Endoscopy;;  ? BIOPSY  03/07/2019  ? Procedure: BIOPSY;  Surgeon: Irving Copas., MD;  Location: [place];  Service: Gastroenterology;;  ? BIOPSY  08/06/2020  ? Procedure: BIOPSY;  Surgeon: Irving Copas., MD;  Location: [place];  Service: Gastroenterology;;  ? BREAST BIOPSY Right   ? BREAST EXCISIONAL BIOPSY Left   ? BREAST SURGERY  1988  ? biopsy  ? CESAREAN SECTION    ? COLONOSCOPY N/A 08/22/2018  ? Procedure: COLONOSCOPY;  Surgeon: Juanita Craver, MD;  Location: [facility] ENDOSCOPY;  Service: Endoscopy;  Laterality: N/A;  ? ENDOSCOPIC MUCOSAL RESECTION N/A 03/07/2019  ? Procedure: ENDOSCOPIC MUCOSAL RESECTION;  Surgeon: Rush Landmark Telford Nab., MD;  Location: [place];  Service: Gastroenterology;  Laterality: N/A;  ? ESOPHAGOGASTRODUODENOSCOPY (EGD) WITH PROPOFOL N/A 08/04/2018  ? Procedure: ESOPHAGOGASTRODUODENOSCOPY (EGD) WITH PROPOFOL;  Surgeon: Ladene Artist, MD;  Location: [place];  Service: Gastroenterology;  Laterality: N/A;  ? ESOPHAGOGASTRODUODENOSCOPY (EGD) WITH PROPOFOL N/A 03/07/2019  ? Procedure: ESOPHAGOGASTRODUODENOSCOPY (EGD) WITH PROPOFOL;  Surgeon: Rush Landmark Telford Nab., MD;  Location: [place];  Service: Gastroenterology;  Laterality: N/A;  ? ESOPHAGOGASTRODUODENOSCOPY (EGD)  WITH PROPOFOL N/A 08/06/2020  ? Procedure: ESOPHAGOGASTRODUODENOSCOPY (EGD) WITH PROPOFOL;  Surgeon: Rush Landmark Telford Nab., MD;  Location: [place];  Service: Gastroenterology;  Laterality: N/A;  ? EUS N/A 03/07/2019  ? Procedure: UPPER ENDOSCOPIC ULTRASOUND (EUS) RADIAL;  Surgeon: Rush Landmark Telford Nab., MD;  Location: [place];  Service: Gastroenterology;  Laterality: N/A;  ? EUS N/A 08/06/2020  ? Procedure: UPPER ENDOSCOPIC ULTRASOUND (EUS) RADIAL;  Surgeon: Rush Landmark Telford Nab., MD;  Location: [place];   Service: Gastroenterology;  Laterality: N/A;  ? FINE NEEDLE ASPIRATION  03/07/2019  ? Procedure: FINE NEEDLE ASPIRATION (FNA) LINEAR;  Surgeon: Irving Copas., MD;  Location: [place];  Service: Gastroenterology;;  ? FINE NEEDLE ASPIRATION  08/06/2020  ? Procedure: FINE NEEDLE ASPIRATION;  Surgeon: Rush Landmark Telford Nab., MD;  Location: [facility];  Service: Gastroenterology;;  ? [place] N/A 08/04/2018  ? Procedure: FLEXIBLE SIGMOIDOSCOPY;  Surgeon: Ladene Artist, MD;  Location: [place];  Service: Gastroenterology;  Laterality: N/A;  ? FLEXIBLE SIGMOIDOSCOPY N/A 08/09/2018  ? Procedure: FLEXIBLE SIGMOIDOSCOPY;  Surgeon: Carol Ada, MD;  Location: [place];  Service: Endoscopy;  Laterality: N/A;  ? HEMOSTASIS CLIP PLACEMENT  03/07/2019  ? Procedure: HEMOSTASIS CLIP PLACEMENT;  Surgeon: Irving Copas., MD;  Location: [place];  Service: Gastroenterology;;  ? SUBMUCOSAL LIFTING INJECTION  03/07/2019  ? Procedure: SUBMUCOSAL LIFTING INJECTION;  Surgeon: Irving Copas., MD;  Location: [place];  Service: Gastroenterology;;  ? VIDEO BRONCHOSCOPY WITH ENDOBRONCHIAL NAVIGATION N/A 03/14/2018  ? Procedure: VIDEO BRONCHOSCOPY WITH ENDOBRONCHIAL NAVIGATION;  Surgeon: Collene Gobble, MD;  Location: [place];  Service: Thoracic;  Laterality: N/A;  ? ? ?Current Medications: ?No outpatient medications have been marked as taking for the 05/04/21 encounter (Appointment) with Freada Bergeron, MD.  ?  ? ?Allergies:   Phenergan [promethazine hcl], Repatha [evolocumab], Amlodipine, Clarithromycin, Codeine, Erythromycin, Hydralazine hcl, Statins, and Tetracycline  ? ?Social History  ? ?Socioeconomic History  ? Marital status: Single  ?  Spouse name: Not on file  ? Number of children: 1  ? Years of education: 60  ? Highest education level: Bachelor's degree (e.g., BA, AB, BS)  ?Occupational History  ? Occupation: retired Careers adviser   ?Tobacco Use  ? Smoking status: Former  ?  Packs/day: 1.00  ?  Years: 10.00  ?  Pack years: 10.00  ?  Types: Cigarettes  ?  Quit date: 02/14/1974  ?  Years since quitting: 51.2  ? Smokeless tobacco: Never  ?Vaping Use  ? Vaping Use: Never used  ?Substance and Sexual Activity  ? Alcohol use: No  ? Drug use: No  ? Sexual activity: Not on file  ?Other Topics Concern  ? Not on file  ?Social History Narrative  ? Lives alone in a one story home.  Has one child and one grandchild.  Retired Careers adviser.  Education: college.   ? ?Social Determinants of Health  ? ?Financial Resource Strain: Not on file  ?Food Insecurity: Not on file  ?Transportation Needs: Not on file  ?Physical Activity: Not on file  ?Stress: Not on file  ?Social Connections: Not on file  ?  ? ?Family History: ?The patient's ***family history includes Arthritis in an other family member; Asthma in her mother and another family member; Breast cancer in an other family member; Breast cancer (age of onset: 26) in her maternal aunt; Heart disease in her mother and another family member; Hypertension in an other family member; Lung  cancer in her father; Parkinson's disease in her maternal grandmother; Rheumatic fever in her maternal grandfather. ? ?ROS:   ?Please see the history of present illness.    ?*** All other systems reviewed and are negative. ? ?EKGs/Labs/Other Studies Reviewed:   ? ?The following studies were reviewed today: ?CT chest: 12/22/2017 ?  ?1. In addition to the previously noted mass in the left lower lobe ?there several other smaller irregular-shaped nodules scattered ?throughout the lungs bilaterally. The irregular shape of the nodules ?would be unusual for metastatic disease. The possibility of a benign ?etiology such as multifocal bronchoceles should be considered, but ?is a diagnosis of exclusion. Malignancy should be excluded, and ?further evaluation with PET-CT is again suggested. ?2. Nonocclusive filling  defect in the distal left main pulmonary ?artery compatible with pulmonary embolism. ?3. Hepatic steatosis. ?4. Aortic atherosclerosis, in addition to left main and left ?anterior descending coronary artery disease.

## 2021-05-03 NOTE — Progress Notes (Signed)
?Cardiology Office Note:   ? ?Date:  05/04/2021  ? ?ID:  [name], DOB Feb 28, 1945, MRN [id number] ? ?PCP:  [name], MD ?  ?[facility] Providers ?Cardiologist:  Freada Bergeron, MD { ? ? ?Referring MD: [name], MD  ? ? ?History of Present Illness:   ? ?[name] is a 76 y.o. female with a hx of difficult to control hypertension, hyperlipidemia, mild nonobstructive CAD, PE, aortic atherosclerosis who was previously followed by Dr. [name] who now returns to clinic for follow-up. ? ?Per review of the record, Cardiac CTA 11/2017 with mild nonobstructive CAD. She has had difficulty with lipid control. Intolerant to pravastatin, atorvastatin, pitavastatin. She reports Repatha caused "paralysis". She has concerns about fish oil and bleeding. She was last seen 04/02/20 and referred to pharmacy. Crestor 5mg  twice per week was initiated. ? ?Was last seen by Laurann Montana where she was only tolerating crestor 2x/week due to myalgias. She was also reducing her dose of vascepa due to concern for bleed. Otherwise, she was stable from a CV standpoint.  ? ?Today, she is doing fine. 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 She sees Dr. Rogue Jury at the [place] [facility] for monitoring. We discussed the results of her recent chest CT 01/2021 given that it showed coronary and aortic calcification. ? ?She endorses chest pain which she believes may be related to gas or her hiatal hernia. The pain radiates to her shoulder and is worse in the evening. She does not notice the pain when she is walking around the grocery store.  ? ?For the last 2 years, she noticed swelling primarily in her L leg. The swelling is typically located from the knee to ankle but could radiate up to the thigh. In the past 2 months, the swelling has been progressively worsening. 2.5 weeks ago, her entire foot was swollen and her toes were purple as if "she had dropped something on  it". During this episodes, her foot felt warm to the touch and hurt to touch. Notably, she is on apixaban for history of PE. Has not missed doses. She has been told she could not take diuretics because of her stage 3a kidney disease.  ? ?She does not walk regularly. However, mild exertion will cause her to become short of breath. She states she the symptoms have progressed but she also is a lot less active than previously. ? ?She only takes her Crestor twice a week along with Vascepa in the morning. She has mild myalgias with the crestor even at twice weekly dosing. ? ?She records her blood pressure at home which ranges from 993Z to 169C systolic and 78L diastolic.  ? ?The patient denies palpitations, PND, or orthopnea Denies cough, fever, chills. Denies nausea, vomiting. Denies syncope or presyncope. Denies snoring. ? ?Past Medical History:  ?Diagnosis Date  ? ALLERGIC RHINITIS   ? ANEMIA-NOS   ? Arthritis   ? ASTHMA   ? Breast discharge   ? Carpal tunnel syndrome   ? COLONIC POLYPS, HX OF   ? Coronary artery disease   ? "mild CAD" noted on 12/05/17 in coronary CT scan  ? DIABETES MELLITUS, TYPE II   ? diet controlled  ? GERD   ? Hand tingling   ? HYPERLIPIDEMIA   ? HYPERTENSION   ? Neuroendocrine tumor   ? OSTEOPENIA   ? PONV (postoperative nausea and vomiting)   ? " it relaxes my bladder muscles  and I have been known to pee all over the place"  ? Psoriasis   ? severe, began soriatane 01/2012  ? Pulmonary embolism ([place])   ? Rectal fissure   ? Scoliosis   ? Wears glasses   ? ? ?Past Surgical History:  ?Procedure Laterality Date  ? benign rectal growth  2004  ? removed by Dr. Zella Richer  ? BIOPSY  08/09/2018  ? Procedure: BIOPSY;  Surgeon: Carol Ada, MD;  Location: [place];  Service: Endoscopy;;  ? BIOPSY  08/22/2018  ? Procedure: BIOPSY;  Surgeon: Juanita Craver, MD;  Location: [facility] ENDOSCOPY;  Service: Endoscopy;;  ? BIOPSY  03/07/2019  ? Procedure: BIOPSY;  Surgeon: Irving Copas., MD;  Location: [place];  Service: Gastroenterology;;  ? BIOPSY  08/06/2020  ? Procedure: BIOPSY;  Surgeon: Irving Copas., MD;  Location: [place];  Service: Gastroenterology;;  ? BREAST BIOPSY Right   ? BREAST EXCISIONAL BIOPSY Left   ? BREAST SURGERY  1988  ? biopsy  ? CESAREAN SECTION    ? COLONOSCOPY N/A 08/22/2018  ? Procedure: COLONOSCOPY;  Surgeon: Juanita Craver, MD;  Location: [facility] ENDOSCOPY;  Service: Endoscopy;  Laterality: N/A;  ? ENDOSCOPIC MUCOSAL RESECTION N/A 03/07/2019  ? Procedure: ENDOSCOPIC MUCOSAL RESECTION;  Surgeon: Rush Landmark Telford Nab., MD;  Location: [place];  Service: Gastroenterology;  Laterality: N/A;  ? ESOPHAGOGASTRODUODENOSCOPY (EGD) WITH PROPOFOL N/A 08/04/2018  ? Procedure: ESOPHAGOGASTRODUODENOSCOPY (EGD) WITH PROPOFOL;  Surgeon: Ladene Artist, MD;  Location: [place];  Service: Gastroenterology;  Laterality: N/A;  ? ESOPHAGOGASTRODUODENOSCOPY (EGD) WITH PROPOFOL N/A 03/07/2019  ? Procedure: ESOPHAGOGASTRODUODENOSCOPY (EGD) WITH PROPOFOL;  Surgeon: Rush Landmark Telford Nab., MD;  Location: [place];  Service: Gastroenterology;  Laterality: N/A;  ? ESOPHAGOGASTRODUODENOSCOPY (EGD) WITH PROPOFOL N/A 08/06/2020  ? Procedure: ESOPHAGOGASTRODUODENOSCOPY (EGD) WITH PROPOFOL;  Surgeon: Rush Landmark Telford Nab., MD;  Location: [place];  Service: Gastroenterology;  Laterality: N/A;  ? EUS N/A 03/07/2019  ? Procedure: UPPER ENDOSCOPIC ULTRASOUND (EUS) RADIAL;  Surgeon: Rush Landmark Telford Nab., MD;  Location: [place];  Service: Gastroenterology;  Laterality: N/A;  ? EUS N/A 08/06/2020  ? Procedure: UPPER ENDOSCOPIC ULTRASOUND (EUS) RADIAL;  Surgeon: Rush Landmark Telford Nab., MD;  Location: [place];  Service: Gastroenterology;  Laterality: N/A;  ? FINE NEEDLE ASPIRATION  03/07/2019  ? Procedure: FINE NEEDLE ASPIRATION (FNA) LINEAR;  Surgeon: Irving Copas., MD;  Location: [place];  Service: Gastroenterology;;  ? FINE NEEDLE ASPIRATION  08/06/2020  ? Procedure: FINE  NEEDLE ASPIRATION;  Surgeon: Rush Landmark Telford Nab., MD;  Location: [place];  Service: Gastroenterology;;  ? [place] N/A 08/04/2018  ? Procedure: FLEXIBLE SIGMOIDOSCOPY;  Surgeon: Ladene Artist, MD;  Location: [place];  Service: Gastroenterology;  Laterality: N/A;  ? FLEXIBLE SIGMOIDOSCOPY N/A 08/09/2018  ? Procedure: FLEXIBLE SIGMOIDOSCOPY;  Surgeon: Carol Ada, MD;  Location: [place];  Service: Endoscopy;  Laterality: N/A;  ? HEMOSTASIS CLIP PLACEMENT  03/07/2019  ? Procedure: HEMOSTASIS CLIP PLACEMENT;  Surgeon: Irving Copas., MD;  Location: [place];  Service: Gastroenterology;;  ? SUBMUCOSAL LIFTING INJECTION  03/07/2019  ? Procedure: SUBMUCOSAL LIFTING INJECTION;  Surgeon: Irving Copas., MD;  Location: [place];  Service: Gastroenterology;;  ? VIDEO BRONCHOSCOPY WITH ENDOBRONCHIAL NAVIGATION N/A 03/14/2018  ? Procedure: VIDEO BRONCHOSCOPY WITH ENDOBRONCHIAL NAVIGATION;  Surgeon: Collene Gobble, MD;  Location: [place];  Service: Thoracic;  Laterality: N/A;  ? ? ?Current Medications: ?Current Meds  ?Medication Sig  ? apixaban (ELIQUIS) 5 MG TABS tablet Take 1 tablet (5 mg total)  by mouth 2 (two) times daily.  ? Ascorbic Acid (VITAMIN C GUMMIE PO) Take 280 mg by mouth every evening. Each tablet 140 mg  ? calcium carbonate (TUMS EX) 750 MG chewable tablet Chew 750 mg by mouth daily.  ? Cholecalciferol (VITAMIN D3 GUMMIES ADULT PO) Take 75 mcg by mouth every evening.  ? clindamycin (CLEOCIN T) 1 % external solution Apply 1 application topically daily as needed (wounds).   ? Coenzyme Q10 (COQ10 GUMMIES ADULT PO) Take 400 mg by mouth in the morning and at bedtime.  ? CREON 36000-114000 units CPEP capsule Take 36,000 Units by mouth 2 (two) times daily with a meal.  ? diphenhydrAMINE (BENADRYL) 25 MG tablet Take 25 mg by mouth at bedtime.  ? folic acid (FOLVITE) 1 MG tablet TAKE 1 TABLET BY MOUTH EVERY DAY  ? halobetasol (ULTRAVATE) 0.05 % cream APPLY TO  AFFECTED AREA TWICE A DAY  ? icosapent Ethyl (VASCEPA) 1 g capsule Take 1 g by mouth 2 (two) times daily.  ? lisinopril (ZESTRIL) 10 MG tablet TAKE 1 TABLET BY MOUTH EVERY DAY  ? Multiple Vitamins-Minerals (ADUL

## 2021-05-04 ENCOUNTER — Ambulatory Visit (INDEPENDENT_AMBULATORY_CARE_PROVIDER_SITE_OTHER): Payer: Medicare Other | Admitting: Cardiology

## 2021-05-04 ENCOUNTER — Encounter: Payer: Self-pay | Admitting: *Deleted

## 2021-05-04 ENCOUNTER — Other Ambulatory Visit: Payer: Self-pay

## 2021-05-04 ENCOUNTER — Encounter: Payer: Self-pay | Admitting: Cardiology

## 2021-05-04 VITALS — BP 156/100 | HR 59 | Ht 60.0 in | Wt 146.6 lb

## 2021-05-04 DIAGNOSIS — Z86711 Personal history of pulmonary embolism: Secondary | ICD-10-CM

## 2021-05-04 DIAGNOSIS — E785 Hyperlipidemia, unspecified: Secondary | ICD-10-CM

## 2021-05-04 DIAGNOSIS — R600 Localized edema: Secondary | ICD-10-CM

## 2021-05-04 DIAGNOSIS — R079 Chest pain, unspecified: Secondary | ICD-10-CM | POA: Diagnosis not present

## 2021-05-04 DIAGNOSIS — I10 Essential (primary) hypertension: Secondary | ICD-10-CM

## 2021-05-04 DIAGNOSIS — R0602 Shortness of breath: Secondary | ICD-10-CM

## 2021-05-04 DIAGNOSIS — I25118 Atherosclerotic heart disease of native coronary artery with other forms of angina pectoris: Secondary | ICD-10-CM

## 2021-05-04 DIAGNOSIS — I700 Atherosclerosis of aorta: Secondary | ICD-10-CM

## 2021-05-04 LAB — D-DIMER, QUANTITATIVE: D-DIMER: 0.37 mg/L FEU (ref 0.00–0.49)

## 2021-05-04 MED ORDER — ICOSAPENT ETHYL 1 G PO CAPS: 1.0000 g | ORAL_CAPSULE | Freq: Two times a day (BID) | ORAL | 2 refills | Status: DC

## 2021-05-04 MED ORDER — ROSUVASTATIN CALCIUM 5 MG PO TABS: 5.0000 mg | ORAL_TABLET | ORAL | 2 refills | Status: DC

## 2021-05-04 MED ORDER — APIXABAN 5 MG PO TABS: 5.0000 mg | ORAL_TABLET | Freq: Two times a day (BID) | ORAL | 2 refills | Status: DC

## 2021-05-04 NOTE — Patient Instructions (Signed)
Medication Instructions:  ? ?Your physician recommends that you continue on your current medications as directed. Please refer to the Current Medication list given to you today. ? ?*If you need a refill on your cardiac medications before your next appointment, please call your pharmacy* ? ? ?Lab Work: ? ?TODAY--D-DIMER ? ?If you have labs (blood work) drawn today and your tests are completely normal, you will receive your results only by: ?MyChart Message (if you have MyChart) OR ?A paper copy in the mail ?If you have any lab test that is abnormal or we need to change your treatment, we will call you to review the results. ? ? ?Testing/Procedures: ? ?Your physician has requested that you have an echocardiogram. Echocardiography is a painless test that uses sound waves to create images of your heart. It provides your doctor with information about the size and shape of your heart and how well your heart?s chambers and valves are working. This procedure takes approximately one hour. There are no restrictions for this procedure. ? ?Your physician has requested that you have a lexiscan myoview. For further information please visit [email or web address]. Please follow instruction sheet, as given. ? ? ? ?Follow-Up: ?At [facility], you and your health needs are our priority.  As part of our continuing mission to provide you with exceptional heart care, we have created designated Provider Care Teams.  These Care Teams include your primary Cardiologist (physician) and Advanced Practice Providers (APPs -  Physician Assistants and Nurse Practitioners) who all work together to provide you with the care you need, when you need it. ? ?We recommend signing up for the patient portal called "MyChart".  Sign up information is provided on this After Visit Summary.  MyChart is used to connect with patients for Virtual Visits (Telemedicine).  Patients are able to view lab/test results, encounter notes, upcoming appointments, etc.   Non-urgent messages can be sent to your provider as well.   ?To learn more about what you can do with MyChart, go to [email or web address].   ? ?Your next appointment:   ?6 month(s) ? ?The format for your next appointment:   ?In Person ? ?Provider:   ?Freada Bergeron, MD { ? ? ? ?

## 2021-05-12 ENCOUNTER — Ambulatory Visit (HOSPITAL_COMMUNITY): Payer: Medicare Other | Attending: Cardiology

## 2021-05-12 ENCOUNTER — Telehealth (HOSPITAL_COMMUNITY): Payer: Self-pay | Admitting: *Deleted

## 2021-05-12 DIAGNOSIS — R0602 Shortness of breath: Secondary | ICD-10-CM | POA: Diagnosis present

## 2021-05-12 DIAGNOSIS — R079 Chest pain, unspecified: Secondary | ICD-10-CM | POA: Insufficient documentation

## 2021-05-12 LAB — ECHOCARDIOGRAM COMPLETE
Area-P 1/2: 3.12 cm2
S' Lateral: 2 cm

## 2021-05-12 NOTE — Telephone Encounter (Signed)
Patient given detailed instructions per Myocardial Perfusion Study Information Sheet for the test on 05/19/21 Patient notified to arrive 15 minutes early and that it is imperative to arrive on time for appointment to keep from having the test rescheduled. ? If you need to cancel or reschedule your appointment, please call the office within 24 hours of your appointment. . Patient verbalized understanding.[name] [name] ? ? ?

## 2021-05-18 ENCOUNTER — Other Ambulatory Visit: Payer: Self-pay

## 2021-05-18 MED ORDER — ICOSAPENT ETHYL 1 G PO CAPS: 1.0000 g | ORAL_CAPSULE | Freq: Two times a day (BID) | ORAL | 3 refills | Status: DC

## 2021-05-19 ENCOUNTER — Ambulatory Visit (HOSPITAL_COMMUNITY): Payer: Medicare Other | Attending: Cardiology

## 2021-05-19 DIAGNOSIS — R0602 Shortness of breath: Secondary | ICD-10-CM | POA: Diagnosis not present

## 2021-05-19 DIAGNOSIS — R079 Chest pain, unspecified: Secondary | ICD-10-CM | POA: Insufficient documentation

## 2021-05-19 LAB — MYOCARDIAL PERFUSION IMAGING
LV dias vol: 33 mL (ref 46–106)
LV sys vol: 5 mL
Nuc Stress EF: 85 %
Peak HR: 81 {beats}/min
Rest HR: 60 {beats}/min
Rest Nuclear Isotope Dose: 10.6 mCi
SDS: 1
SRS: 2
SSS: 3
ST Depression (mm): 0 mm
Stress Nuclear Isotope Dose: 33 mCi
TID: 0.86

## 2021-05-19 MED ORDER — REGADENOSON 0.4 MG/5ML IV SOLN
0.4000 mg | Freq: Once | INTRAVENOUS | Status: AC
  Administered 2021-05-19: 0.4 mg via INTRAVENOUS

## 2021-05-19 MED ORDER — TECHNETIUM TC 99M TETROFOSMIN IV KIT
10.6000 | PACK | Freq: Once | INTRAVENOUS | Status: AC | PRN
  Administered 2021-05-19: 10.6 via INTRAVENOUS
  Filled 2021-05-19: qty 11

## 2021-05-19 MED ORDER — TECHNETIUM TC 99M TETROFOSMIN IV KIT
33.0000 | PACK | Freq: Once | INTRAVENOUS | Status: AC | PRN
  Administered 2021-05-19: 33 via INTRAVENOUS
  Filled 2021-05-19: qty 33

## 2021-05-26 ENCOUNTER — Ambulatory Visit (INDEPENDENT_AMBULATORY_CARE_PROVIDER_SITE_OTHER): Payer: Medicare Other

## 2021-05-26 DIAGNOSIS — E785 Hyperlipidemia, unspecified: Secondary | ICD-10-CM

## 2021-05-26 DIAGNOSIS — K219 Gastro-esophageal reflux disease without esophagitis: Secondary | ICD-10-CM

## 2021-05-26 DIAGNOSIS — Z86711 Personal history of pulmonary embolism: Secondary | ICD-10-CM

## 2021-05-26 DIAGNOSIS — I10 Essential (primary) hypertension: Secondary | ICD-10-CM

## 2021-05-26 NOTE — Patient Instructions (Signed)
Visit Information ? ?Following are the goals we discussed today:  ? ?Track and Manage My Blood Pressure  ? ?Timeframe:  Long-Range Goal ?Priority:  High ?Start Date:   07/15/2020                          ?Expected End Date:  05/27/2022                 ? ?Follow Up Date 11/2021 ?  ?- check blood pressure weekly ?- write blood pressure results in a log or diary  ?  ?Why is this important?   ?You won't feel high blood pressure, but it can still hurt your blood vessels.  ?High blood pressure can cause heart or kidney problems. It can also cause a stroke.  ?Making lifestyle changes like losing a little weight or eating less salt will help.  ?Checking your blood pressure at home and at different times of the day can help to control blood pressure.  ?If the doctor prescribes medicine remember to take it the way the doctor ordered.  ?Call the office if you cannot afford the medicine or if there are questions about it.   ?  ?Notes: Patient will reach out with concerns about low blood pressures or side effects from medications should she experience any issues  ? ?Plan: Telephone follow up appointment with care management team member scheduled for:  6 months ?The patient has been provided with contact information for the care management team and has been advised to call with any health related questions or concerns.  ? ?Tomasa Blase, PharmD ?Clinical Pharmacist, [place]  ? ?Please call the care guide team at [phone] if you need to cancel or reschedule your appointment.  ? ?Patient verbalizes understanding of instructions and care plan provided today and agrees to view in [place]. Active MyChart status confirmed with patient.   ? ?

## 2021-05-26 NOTE — Progress Notes (Signed)
? ?Chronic Care Management ?Pharmacy Note ? ?05/26/2021 ?Name:  [name] MRN:  [id number] DOB:  [birth year] ? ? ?Summary: ?-Patient reports that she has been doing well since last visit, BP has been stable when she is checking at home, typically averaging 120/70s ?-LDL stable  - reports that she is taking crestor 8m 2-3 times weekly- most that she has been able to tolerate  ?-Denies any current issues with abnormal bruising or bleeding with eliquis ?-following closely with GI about creon dosing and bowel issues - reports that she recently decreased to BID dosing - feels she may have been more stable with TID dosing  ? ?Recommendations/Changes made from today's visit: ?-Recommending no changes to current medications, patient to continue to monitor blood pressure regularly and reach out to clinic should BP elevate >140/90 on average ?-Advised for patient to reach out to gastro provider about changing back to TID dosing for creon ? ?Subjective: ?Caitlin GENSONis an 76y.o. year old female who is a primary patient of Burns, SClaudina Lick MD.  The CCM team was consulted for assistance with disease management and care coordination needs.   ? ?Engaged with patient by telephone for follow up visit in response to provider referral for pharmacy case management and/or care coordination services.  ? ?Consent to Services:  ?The patient was given the following information about Chronic Care Management services today, agreed to services, and gave verbal consent: 1. CCM service includes personalized support from designated clinical staff supervised by the primary care provider, including individualized plan of care and coordination with other care providers 2. 24/7 contact phone numbers for assistance for urgent and routine care needs. 3. Service will only be billed when office clinical staff spend 20 minutes or more in a month to coordinate care. 4. Only one practitioner may furnish and bill the service in a calendar  month. 5.The patient may stop CCM services at any time (effective at the end of the month) by phone call to the office staff. 6. The patient will be responsible for cost sharing (co-pay) of up to 20% of the service fee (after annual deductible is met). Patient agreed to services and consent obtained. ? ?Patient Care Team: ?BBinnie Rail MD as PCP - General (Internal Medicine) ?PFreada Bergeron MD as PCP - Cardiology (Cardiology) ?LRolm Bookbinder MD (Dermatology) ?MMarica Otter OD (Optometry) ?MJuanita Craver MD as Consulting Physician (Gastroenterology) ?CStarling Manns MD (Orthopedic Surgery) ?STomasa Blase [facility] Centeras Pharmacist (Pharmacist) ? ?Recent office visits: ?03/23/2021 - Dr. BQuay Burow- no changes to medications - f/u in 6 months  ? ?Recent consult visits: ?05/04/2021 - Dr. PJohney Frame- Cardiology - no changes to medications - f/u in 6 months  ?03/10/2021 - Dr. BLamonte Sakai- Pulmonology - repeat CT of chest in 01/2023 - no changes to medications  ?02/24/2021 - Dr. MEarlean Shawl -[facility]- recommended miralax daily - f/u prn  ?01/21/2021 - Dr. BLamonte Sakai- Pulmonology - no changes to medications  ?12/30/2020 - Dr. MJulien Nordmann- Oncology - no changes to medications - f/u in 6 months  ? ?Hospital visits: ?None in previous 6 months ? ?Objective: ? ?Lab Results  ?Component Value Date  ? CREATININE 1.11 03/23/2021  ? BUN 15 03/23/2021  ? GFR 47.98 (L) 03/23/2021  ? GFRNONAA 50 (L) 12/30/2020  ? GFRAA 58 (L) 11/05/2019  ? NA 140 03/23/2021  ? K 4.4 03/23/2021  ? CALCIUM 9.8 03/23/2021  ? CO2 32 03/23/2021  ? GLUCOSE 114 (H)  03/23/2021  ? ? ?Lab Results  ?Component Value Date/Time  ? HGBA1C 6.7 (H) 03/23/2021 10:02 AM  ? HGBA1C 6.9 (H) 11/11/2020 09:19 AM  ? GFR 47.98 (L) 03/23/2021 10:02 AM  ? GFR 47.08 (L) 11/11/2020 09:19 AM  ? MICROALBUR <0.7 10/28/2015 09:18 AM  ? MICROALBUR <0.7 12/23/2014 12:02 PM  ?  ?Last diabetic Eye exam:  ?Lab Results  ?Component Value Date/Time  ? HMDIABEYEEXA No Retinopathy 07/01/2016 12:00 AM  ?  ?Last diabetic  Foot exam:  ?No results found for: HMDIABFOOTEX  ? ?Lab Results  ?Component Value Date  ? CHOL 196 03/23/2021  ? HDL 52.30 03/23/2021  ? LDLCALC 109 (H) 03/23/2021  ? LDLDIRECT 193.0 02/24/2020  ? TRIG 176.0 (H) 03/23/2021  ? CHOLHDL 4 03/23/2021  ? ? ? ?  Latest Ref Rng & Units 03/23/2021  ? 10:02 AM 12/30/2020  ? 10:02 AM 11/11/2020  ?  9:19 AM  ?Hepatic Function  ?Total Protein 6.0 - 8.3 g/dL 7.5   7.2   7.4    ?Albumin 3.5 - 5.2 g/dL 4.2   3.6   4.0    ?AST 0 - 37 U/L '16   17   20    ' ?ALT 0 - 35 U/L '18   22   22    ' ?Alk Phosphatase 39 - 117 U/L 76   86   76    ?Total Bilirubin 0.2 - 1.2 mg/dL 0.7   0.5   0.6    ? ? ?Lab Results  ?Component Value Date/Time  ? TSH 2.63 11/11/2020 09:19 AM  ? TSH 3.00 02/24/2020 10:01 AM  ? FREET4 0.91 11/11/2020 09:19 AM  ? FREET4 0.75 05/01/2017 09:01 AM  ? ? ? ?  Latest Ref Rng & Units 03/23/2021  ? 10:02 AM 12/30/2020  ? 10:02 AM 11/11/2020  ?  9:19 AM  ?CBC  ?WBC 4.0 - 10.5 K/uL 8.1   8.9   8.3    ?Hemoglobin 12.0 - 15.0 g/dL 13.0   12.9   12.7    ?Hematocrit 36.0 - 46.0 % 40.3   40.0   38.2    ?Platelets 150.0 - 400.0 K/uL 280.0   309   300.0    ? ? ?Lab Results  ?Component Value Date/Time  ? VD25OH 45.51 06/23/2020 08:37 AM  ? VD25OH 50.62 02/24/2020 10:01 AM  ? ? ?Clinical ASCVD: No  ?The 10-year ASCVD risk score (Arnett DK, et al., 2019) is: 57.3% ?  Values used to calculate the score: ?    Age: 57 years ?    Sex: Female ?    Is Non-Hispanic African American: No ?    Diabetic: Yes ?    Tobacco smoker: No ?    Systolic Blood Pressure: 664 mmHg ?    Is BP treated: Yes ?    HDL Cholesterol: 52.3 mg/dL ?    Total Cholesterol: 196 mg/dL   ? ? ?  03/23/2021  ?  9:18 AM 02/24/2020  ?  8:50 AM 01/02/2019  ?  2:59 PM  ?Depression screen PHQ 2/9  ?Decreased Interest 1 0 0  ?Down, Depressed, Hopeless 1 1 0  ?PHQ - 2 Score 2 1 0  ?  ? ? ?Social History  ? ?Tobacco Use  ?Smoking Status Former  ? Packs/day: 1.00  ? Years: 10.00  ? Pack years: 10.00  ? Types: Cigarettes  ? Quit date: 02/14/1974  ?  Years since quitting: 47.3  ?Smokeless Tobacco Never  ? ?BP  Readings from Last 3 Encounters:  ?05/04/21 (!) 156/100  ?03/23/21 128/86  ?03/10/21 (!) 146/82  ? ?Pulse Readings from Last 3 Encounters:  ?05/04/21 (!) 59  ?03/23/21 62  ?03/10/21 83  ? ?Wt Readings from Last 3 Encounters:  ?05/19/21 146 lb (66.2 kg)  ?05/04/21 146 lb 9.6 oz (66.5 kg)  ?03/23/21 146 lb 11.2 oz (66.5 kg)  ? ?BMI Readings from Last 3 Encounters:  ?05/19/21 28.51 kg/m?  ?05/04/21 28.63 kg/m?  ?03/23/21 28.65 kg/m?  ? ? ?Assessment/Interventions: Review of patient past medical history, allergies, medications, health status, including review of consultants reports, laboratory and other test data, was performed as part of comprehensive evaluation and provision of chronic care management services.  ? ?SDOH:  (Social Determinants of Health) assessments and interventions performed: Yes ? ?SDOH Screenings  ? ?Alcohol Screen: Not on file  ?Depression (PHQ2-9): Low Risk   ? PHQ-2 Score: 2  ?Financial Resource Strain: Not on file  ?Food Insecurity: Not on file  ?Housing: Not on file  ?Physical Activity: Not on file  ?Social Connections: Not on file  ?Stress: Not on file  ?Tobacco Use: Medium Risk  ? Smoking Tobacco Use: Former  ? Smokeless Tobacco Use: Never  ? Passive Exposure: Not on file  ?Transportation Needs: Not on file  ? ? ?[place] ? ?Allergies  ?Allergen Reactions  ? Phenergan [Promethazine Hcl] Shortness Of Breath and Anxiety  ? Repatha [Evolocumab]   ?  Muscle pain and weakness  ? Amlodipine Swelling  ? Clarithromycin Nausea And Vomiting  ?  May have episodes of syncope due to prolonged vomiting ?  ? Codeine Nausea And Vomiting  ?  May have episodes of syncope due to prolonged vomiting  ? Erythromycin Nausea And Vomiting  ? Hydralazine Hcl   ?  Drug-induced lupus - pt wishes to avoid  ? Statins   ?  Muscle pain   ? Tetracycline Nausea And Vomiting  ? ? ?Medications Reviewed Today   ? ? Reviewed by Tomasa Blase, [facility] (Pharmacist) on  05/26/21 at [place] List Status: <None>  ? ?Medication Order Taking? Sig Documenting Provider Last Dose Status Informant  ?apixaban (ELIQUIS) 5 MG TABS tablet [id number] Yes Take 1 tablet (5 mg total) b

## 2021-06-13 DIAGNOSIS — E785 Hyperlipidemia, unspecified: Secondary | ICD-10-CM

## 2021-06-13 DIAGNOSIS — N183 Chronic kidney disease, stage 3 unspecified: Secondary | ICD-10-CM | POA: Diagnosis not present

## 2021-06-13 DIAGNOSIS — I129 Hypertensive chronic kidney disease with stage 1 through stage 4 chronic kidney disease, or unspecified chronic kidney disease: Secondary | ICD-10-CM | POA: Diagnosis not present

## 2021-06-13 DIAGNOSIS — E1122 Type 2 diabetes mellitus with diabetic chronic kidney disease: Secondary | ICD-10-CM | POA: Diagnosis not present

## 2021-06-22 ENCOUNTER — Other Ambulatory Visit: Payer: Self-pay

## 2021-06-22 ENCOUNTER — Inpatient Hospital Stay: Payer: Medicare Other | Attending: Internal Medicine

## 2021-06-22 ENCOUNTER — Other Ambulatory Visit: Payer: Medicare Other

## 2021-06-22 DIAGNOSIS — I10 Essential (primary) hypertension: Secondary | ICD-10-CM | POA: Diagnosis not present

## 2021-06-22 DIAGNOSIS — R933 Abnormal findings on diagnostic imaging of other parts of digestive tract: Secondary | ICD-10-CM | POA: Insufficient documentation

## 2021-06-22 DIAGNOSIS — D3A8 Other benign neuroendocrine tumors: Secondary | ICD-10-CM

## 2021-06-22 DIAGNOSIS — Z7901 Long term (current) use of anticoagulants: Secondary | ICD-10-CM | POA: Insufficient documentation

## 2021-06-22 DIAGNOSIS — I2699 Other pulmonary embolism without acute cor pulmonale: Secondary | ICD-10-CM | POA: Diagnosis not present

## 2021-06-22 LAB — CMP (CANCER CENTER ONLY)
ALT: 19 U/L (ref 0–44)
AST: 18 U/L (ref 15–41)
Albumin: 3.7 g/dL (ref 3.5–5.0)
Alkaline Phosphatase: 65 U/L (ref 38–126)
Anion gap: 10 (ref 5–15)
BUN: 21 mg/dL (ref 8–23)
CO2: 26 mmol/L (ref 22–32)
Calcium: 9.4 mg/dL (ref 8.9–10.3)
Chloride: 103 mmol/L (ref 98–111)
Creatinine: 1.19 mg/dL — ABNORMAL HIGH (ref 0.44–1.00)
GFR, Estimated: 47 mL/min — ABNORMAL LOW (ref >60)
Glucose, Bld: 111 mg/dL — ABNORMAL HIGH (ref 70–99)
Potassium: 4.6 mmol/L (ref 3.5–5.1)
Sodium: 139 mmol/L (ref 135–145)
Total Bilirubin: 0.7 mg/dL (ref 0.3–1.2)
Total Protein: 7.2 g/dL (ref 6.5–8.1)

## 2021-06-22 LAB — CBC WITH DIFFERENTIAL (CANCER CENTER ONLY)
Abs Immature Granulocytes: 0.03 10*3/uL (ref 0.00–0.07)
Basophils Absolute: 0.1 10*3/uL (ref 0.0–0.1)
Basophils Relative: 1 %
Eosinophils Absolute: 0.4 10*3/uL (ref 0.0–0.5)
Eosinophils Relative: 4 %
HCT: 38.5 % (ref 36.0–46.0)
Hemoglobin: 12.5 g/dL (ref 12.0–15.0)
Immature Granulocytes: 0 %
Lymphocytes Relative: 23 %
Lymphs Abs: 1.8 10*3/uL (ref 0.7–4.0)
MCH: 29.8 pg (ref 26.0–34.0)
MCHC: 32.5 g/dL (ref 30.0–36.0)
MCV: 91.9 fL (ref 80.0–100.0)
Monocytes Absolute: 0.6 10*3/uL (ref 0.1–1.0)
Monocytes Relative: 7 %
Neutro Abs: 5.1 10*3/uL (ref 1.7–7.7)
Neutrophils Relative %: 65 %
Platelet Count: 295 10*3/uL (ref 150–400)
RBC: 4.19 MIL/uL (ref 3.87–5.11)
RDW: 15.9 % — ABNORMAL HIGH (ref 11.5–15.5)
WBC Count: 8 10*3/uL (ref 4.0–10.5)
nRBC: 0 % (ref 0.0–0.2)

## 2021-06-23 ENCOUNTER — Other Ambulatory Visit: Payer: Self-pay | Admitting: Internal Medicine

## 2021-06-24 LAB — CHROMOGRANIN A: Chromogranin A (ng/mL): 1243 ng/mL — ABNORMAL HIGH (ref 0.0–101.8)

## 2021-06-29 ENCOUNTER — Other Ambulatory Visit: Payer: Self-pay

## 2021-06-29 ENCOUNTER — Inpatient Hospital Stay (HOSPITAL_BASED_OUTPATIENT_CLINIC_OR_DEPARTMENT_OTHER): Payer: Medicare Other | Admitting: Internal Medicine

## 2021-06-29 VITALS — BP 158/84 | HR 66 | Temp 98.4°F | Resp 16 | Wt 147.6 lb

## 2021-06-29 DIAGNOSIS — C7A8 Other malignant neuroendocrine tumors: Secondary | ICD-10-CM

## 2021-06-29 DIAGNOSIS — D3A8 Other benign neuroendocrine tumors: Secondary | ICD-10-CM

## 2021-06-29 DIAGNOSIS — R933 Abnormal findings on diagnostic imaging of other parts of digestive tract: Secondary | ICD-10-CM | POA: Diagnosis not present

## 2021-06-29 NOTE — Progress Notes (Signed)
?    [place] ?[phone]   [phone] ? ?OFFICE PROGRESS NOTE ? ?[name], MD ?[place] [address] ? 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 ? ?PRIOR THERAPY: None ? ?CURRENT THERAPY: Xarelto 15 mg p.o. twice daily for 3 weeks followed by 20 mg p.o. daily. ? ?INTERVAL HISTORY: ?[name] 76 y.o. female returns to the clinic today for follow-up visit.  The patient is feeling fine today with no concerning complaints except for intermittent abdominal pain and feeling more tired than before.  Her gastroenterologist Dr. Earlean Shawl is retiring and she is currently searching for another gastroenterologist.  The patient has been in observation and she is feeling fine except for the above symptoms.  She was seen by Dr. Lamonte Sakai and had bronchoscopy that was unremarkable for any suspicious malignancy.  She denied having any current chest pain, shortness of breath, cough or hemoptysis.  She has no nausea, vomiting, diarrhea or constipation.  She has no sweating or hot flashes.  She has no recent weight loss.  She is here today for evaluation and repeat blood work. ? ? ?MEDICAL HISTORY: ?Past Medical History:  ?Diagnosis Date  ? ALLERGIC RHINITIS   ? ANEMIA-NOS   ? Arthritis   ? ASTHMA   ? Breast discharge   ? Carpal tunnel syndrome   ? COLONIC POLYPS, HX OF   ? Coronary artery disease   ? "mild CAD" noted on 12/05/17 in coronary CT scan  ? DIABETES MELLITUS, TYPE II   ? diet controlled  ? GERD   ? Hand tingling   ? HYPERLIPIDEMIA   ? HYPERTENSION   ? Neuroendocrine tumor   ? OSTEOPENIA   ? PONV (postoperative nausea and vomiting)   ? " it relaxes my bladder muscles and I have been known to pee all over the place"  ? Psoriasis   ? severe, began soriatane 01/2012  ? Pulmonary embolism ([place])   ? Rectal fissure   ?  Scoliosis   ? Wears glasses   ? ? ?ALLERGIES:  is allergic to phenergan [promethazine hcl], repatha [evolocumab], amlodipine, clarithromycin, codeine, erythromycin, hydralazine hcl, statins, and tetracycline. ? ?MEDICATIONS:  ?Current Outpatient Medications  ?Medication Sig Dispense Refill  ? apixaban (ELIQUIS) 5 MG TABS tablet Take 1 tablet (5 mg total) by mouth 2 (two) times daily. 180 tablet 2  ? Ascorbic Acid (VITAMIN C GUMMIE PO) Take 282 mg by mouth every evening. Each tablet 141 mg    ? calcium carbonate (TUMS EX) 750 MG chewable tablet Chew 750 mg by mouth daily.    ? Cholecalciferol (VITAMIN D3 GUMMIES ADULT PO) Take 2,000 Units by mouth every evening.    ? clindamycin (CLEOCIN T) 1 % external solution Apply 1 application topically daily as needed (wounds).     ? Coenzyme Q10 (COQ10 GUMMIES ADULT PO) Take 200 mg by mouth in the morning and at bedtime.    ? CREON 36000-114000 units CPEP capsule Take 36,000 Units by mouth 2 (two) times daily with a meal.    ? diphenhydrAMINE (BENADRYL) 25 MG tablet Take 25 mg by mouth at bedtime.    ? folic acid (FOLVITE) 1 MG tablet TAKE 1 TABLET BY MOUTH EVERY DAY 30 tablet 5  ? halobetasol (ULTRAVATE) 0.05 % cream APPLY TO AFFECTED AREA TWICE A DAY    ?  icosapent Ethyl (VASCEPA) 1 g capsule Take 1 capsule (1 g total) by mouth 2 (two) times daily. 360 capsule 3  ? lisinopril (ZESTRIL) 10 MG tablet TAKE 1 TABLET BY MOUTH EVERY DAY 30 tablet 5  ? Multiple Vitamins-Minerals (ADULT GUMMY PO) Take 1 tablet by mouth every evening. [place] gummie formula    ? nebivolol (BYSTOLIC) 2.5 MG tablet TAKE 1 TABLET BY MOUTH EVERY DAY *BRAND BYSTOLIC* 30 tablet 5  ? omeprazole (PRILOSEC OTC) 20 MG tablet Take 20 mg by mouth daily.    ? ondansetron (ZOFRAN-ODT) 8 MG disintegrating tablet Take 1 tablet (8 mg total) by mouth every 8 (eight) hours as needed for nausea. 60 tablet 2  ? ONETOUCH ULTRA test strip USE TEST STRIPS TO CHECK BLOOD SUGAR DAILY AND AS NEEDED. E11.9 300  strip 1  ? oxymetazoline (AFRIN) 0.05 % nasal spray Place 1 spray into both nostrils 2 (two) times daily.    ? polyethylene glycol (MIRALAX / GLYCOLAX) 17 g packet Take 17 g by mouth every other day.    ? rosuvastatin (CRESTOR) 5 MG tablet Take 1 tablet (5 mg total) by mouth 2 (two) times a week. Sometimes 3 times a week 45 tablet 2  ? Simethicone (GAS-X PO) Take 250 mg by mouth daily as needed.    ? vitamin B-12 (CYANOCOBALAMIN) 1000 MCG tablet Take 1,000 mcg by mouth daily.    ? ?No current facility-administered medications for this visit.  ? ? ?SURGICAL HISTORY:  ?Past Surgical History:  ?Procedure Laterality Date  ? benign rectal growth  2004  ? removed by Dr. Zella Richer  ? BIOPSY  08/09/2018  ? Procedure: BIOPSY;  Surgeon: Carol Ada, MD;  Location: [place];  Service: Endoscopy;;  ? BIOPSY  08/22/2018  ? Procedure: BIOPSY;  Surgeon: Juanita Craver, MD;  Location: [facility] ENDOSCOPY;  Service: Endoscopy;;  ? BIOPSY  03/07/2019  ? Procedure: BIOPSY;  Surgeon: Irving Copas., MD;  Location: [place];  Service: Gastroenterology;;  ? BIOPSY  08/06/2020  ? Procedure: BIOPSY;  Surgeon: Irving Copas., MD;  Location: [place];  Service: Gastroenterology;;  ? BREAST BIOPSY Right   ? BREAST EXCISIONAL BIOPSY Left   ? BREAST SURGERY  1988  ? biopsy  ? CESAREAN SECTION    ? COLONOSCOPY N/A 08/22/2018  ? Procedure: COLONOSCOPY;  Surgeon: Juanita Craver, MD;  Location[facility] ENDOSCOPY;  Service: Endoscopy;  Laterality: N/A;  ? ENDOSCOPIC MUCOSAL RESECTION N/A 03/07/2019  ? Procedure: ENDOSCOPIC MUCOSAL RESECTION;  Surgeon: Rush Landmark Telford Nab., MD;  Location: [place];  Service: Gastroenterology;  Laterality: N/A;  ? ESOPHAGOGASTRODUODENOSCOPY (EGD) WITH PROPOFOL N/A 08/04/2018  ? Procedure: ESOPHAGOGASTRODUODENOSCOPY (EGD) WITH PROPOFOL;  Surgeon: Ladene Artist, MD;  Location: [place];  Service: Gastroenterology;  Laterality: N/A;  ? ESOPHAGOGASTRODUODENOSCOPY (EGD) WITH PROPOFOL N/A 03/07/2019  ?  Procedure: ESOPHAGOGASTRODUODENOSCOPY (EGD) WITH PROPOFOL;  Surgeon: Rush Landmark Telford Nab., MD;  Location: [place];  Service: Gastroenterology;  Laterality: N/A;  ? ESOPHAGOGASTRODUODENOSCOPY (EGD) WITH PROPOFOL N/A 08/06/2020  ? Procedure: ESOPHAGOGASTRODUODENOSCOPY (EGD) WITH PROPOFOL;  Surgeon: Rush Landmark Telford Nab., MD;  Location: [place];  Service: Gastroenterology;  Laterality: N/A;  ? EUS N/A 03/07/2019  ? Procedure: UPPER ENDOSCOPIC ULTRASOUND (EUS) RADIAL;  Surgeon: Rush Landmark Telford Nab., MD;  Location: [place];  Service: Gastroenterology;  Laterality: N/A;  ? EUS N/A 08/06/2020  ? Procedure: UPPER ENDOSCOPIC ULTRASOUND (EUS) RADIAL;  Surgeon: Rush Landmark Telford Nab., MD;  Location: [place];  Service: Gastroenterology;  Laterality: N/A;  ? FINE NEEDLE  ASPIRATION  03/07/2019  ? Procedure: FINE NEEDLE ASPIRATION (FNA) LINEAR;  Surgeon: Irving Copas., MD;  Location: [place];  Service: Gastroenterology;;  ? FINE NEEDLE ASPIRATION  08/06/2020  ? Procedure: FINE NEEDLE ASPIRATION;  Surgeon: Rush Landmark Telford Nab., MD;  Location: [place];  Service: Gastroenterology;;  ? [place] N/A 08/04/2018  ? Procedure: FLEXIBLE SIGMOIDOSCOPY;  Surgeon: Ladene Artist, MD;  Location: [place];  Service: Gastroenterology;  Laterality: N/A;  ? FLEXIBLE SIGMOIDOSCOPY N/A 08/09/2018  ? Procedure: FLEXIBLE SIGMOIDOSCOPY;  Surgeon: Carol Ada, MD;  Location: [place];  Service: Endoscopy;  Laterality: N/A;  ? HEMOSTASIS CLIP PLACEMENT  03/07/2019  ? Procedure: HEMOSTASIS CLIP PLACEMENT;  Surgeon: Irving Copas., MD;  Location: [place];  Service: Gastroenterology;;  ? SUBMUCOSAL LIFTING INJECTION  03/07/2019  ? Procedure: SUBMUCOSAL LIFTING INJECTION;  Surgeon: Irving Copas., MD;  Location: [place];  Service: Gastroenterology;;  ? VIDEO BRONCHOSCOPY WITH ENDOBRONCHIAL NAVIGATION N/A 03/14/2018  ? Procedure: VIDEO BRONCHOSCOPY WITH  ENDOBRONCHIAL NAVIGATION;  Surgeon: Collene Gobble, MD;  Location: [facility];  Service: Thoracic;  Laterality: N/A;  ? ? ?REVIEW OF SYSTEMS:  Constitutional: positive for fatigue ?Eyes: negative ?Ears, nose, mouth, th

## 2021-07-02 LAB — 5 HIAA, QUANTITATIVE, URINE, 24 HOUR
5-HIAA, Ur: 1.7 mg/L
5-HIAA,Quant.,24 Hr Urine: 3.9 mg/24 hr (ref 0.0–14.9)
Total Volume: 2300

## 2021-08-19 ENCOUNTER — Telehealth: Payer: Self-pay

## 2021-08-19 NOTE — Telephone Encounter (Signed)
Patient called to inquire about referral to Dr. Leslie Andrea at [facility] for 2nd opinion. Called and spoke with referral specialist. Chart information faxed to [phone]. Specialist stated that pt would be contact within 2 days for appointment. Patient advised.

## 2021-08-24 ENCOUNTER — Ambulatory Visit: Payer: Medicare Other | Attending: Internal Medicine

## 2021-08-24 DIAGNOSIS — Z23 Encounter for immunization: Secondary | ICD-10-CM

## 2021-08-25 ENCOUNTER — Telehealth: Payer: Self-pay | Admitting: Medical Oncology

## 2021-08-25 NOTE — Telephone Encounter (Signed)
Pt notified that Dr Leamon Arnt has her records and will determine next steps.

## 2021-08-25 NOTE — Telephone Encounter (Signed)
[facility] oncology referral-Pt has not heard from Dr Leamon Arnt @ [place]. His office staff tried to contact pt July 7th . Unable to reach her.  Pt stated they had power outage that day including cell phones.   Dr Leamon Arnt' team has her information. He  will review and make recommendations on next steps. Asking if pt had bx. and I told him they were not successful.

## 2021-08-30 NOTE — Progress Notes (Signed)
   Covid-19 Vaccination Clinic  Name:  [name]    MRN: [id number] DOB: 04-Oct-1945  08/30/2021  [name] was observed post Covid-19 immunization for 15 minutes without incident. She was provided with Vaccine Information Sheet and instruction to access the V-Safe system.   [name] was instructed to call 911 with any severe reactions post vaccine: Difficulty breathing  Swelling of face and throat  A fast heartbeat  A bad rash all over body  Dizziness and weakness   Immunizations Administered     Name Date Dose VIS Date Route   Pfizer Covid-19 Vaccine Bivalent Booster 08/24/2021 11:00 AM 0.3 mL 10/14/2020 Intramuscular   Manufacturer: [place]   Lot: [id number]   [place]: [phone]

## 2021-09-03 ENCOUNTER — Other Ambulatory Visit: Payer: Self-pay | Admitting: Internal Medicine

## 2021-10-03 ENCOUNTER — Encounter: Payer: Self-pay | Admitting: Internal Medicine

## 2021-10-03 NOTE — Patient Instructions (Addendum)
Blood work was ordered.     Medications changes include :  decrease lisinopril to 5 mg daily     Return in about 6 months (around 04/07/2022) for follow up.    Health Maintenance, Female Adopting a healthy lifestyle and getting preventive care are important in promoting health and wellness. Ask your health care provider about: The right schedule for you to have regular tests and exams. Things you can do on your own to prevent diseases and keep yourself healthy. What should I know about diet, weight, and exercise? Eat a healthy diet  Eat a diet that includes plenty of vegetables, fruits, low-fat dairy products, and lean protein. Do not eat a lot of foods that are high in solid fats, added sugars, or sodium. Maintain a healthy weight Body mass index (BMI) is used to identify weight problems. It estimates body fat based on height and weight. Your health care provider can help determine your BMI and help you achieve or maintain a healthy weight. Get regular exercise Get regular exercise. This is one of the most important things you can do for your health. Most adults should: Exercise for at least 150 minutes each week. The exercise should increase your heart rate and make you sweat (moderate-intensity exercise). Do strengthening exercises at least twice a week. This is in addition to the moderate-intensity exercise. Spend less time sitting. Even light physical activity can be beneficial. Watch cholesterol and blood lipids Have your blood tested for lipids and cholesterol at 78 years of age, then have this test every 5 years. Have your cholesterol levels checked more often if: Your lipid or cholesterol levels are high. You are older than 78 years of age. You are at high risk for heart disease. What should I know about cancer screening? Depending on your health history and family history, you may need to have cancer screening at various ages. This may include screening for: Breast  cancer. Cervical cancer. Colorectal cancer. Skin cancer. Lung cancer. What should I know about heart disease, diabetes, and high blood pressure? Blood pressure and heart disease High blood pressure causes heart disease and increases the risk of stroke. This is more likely to develop in people who have high blood pressure readings or are overweight. Have your blood pressure checked: Every 3-5 years if you are 29-57 years of age. Every year if you are 23 years old or older. Diabetes Have regular diabetes screenings. This checks your fasting blood sugar level. Have the screening done: Once every three years after age 56 if you are at a normal weight and have a low risk for diabetes. More often and at a younger age if you are overweight or have a high risk for diabetes. What should I know about preventing infection? Hepatitis B If you have a higher risk for hepatitis B, you should be screened for this virus. Talk with your health care provider to find out if you are at risk for hepatitis B infection. Hepatitis C Testing is recommended for: Everyone born from [birth year] through 1967. Anyone with known risk factors for hepatitis C. Sexually transmitted infections (STIs) Get screened for STIs, including gonorrhea and chlamydia, if: You are sexually active and are younger than 78 years of age. You are older than 77 years of age and your health care provider tells you that you are at risk for this type of infection. Your sexual activity has changed since you were last screened, and you are at increased risk for chlamydia  or gonorrhea. Ask your health care provider if you are at risk. Ask your health care provider about whether you are at high risk for HIV. Your health care provider may recommend a prescription medicine to help prevent HIV infection. If you choose to take medicine to prevent HIV, you should first get tested for HIV. You should then be tested every 3 months for as long as you are taking  the medicine. Pregnancy If you are about to stop having your period (premenopausal) and you may become pregnant, seek counseling before you get pregnant. Take 400 to 800 micrograms (mcg) of folic acid every day if you become pregnant. Ask for birth control (contraception) if you want to prevent pregnancy. Osteoporosis and menopause Osteoporosis is a disease in which the bones lose minerals and strength with aging. This can result in bone fractures. If you are 80 years old or older, or if you are at risk for osteoporosis and fractures, ask your health care provider if you should: Be screened for bone loss. Take a calcium or vitamin D supplement to lower your risk of fractures. Be given hormone replacement therapy (HRT) to treat symptoms of menopause. Follow these instructions at home: Alcohol use Do not drink alcohol if: Your health care provider tells you not to drink. You are pregnant, may be pregnant, or are planning to become pregnant. If you drink alcohol: Limit how much you have to: 0-1 drink a day. Know how much alcohol is in your drink. In the [place] one drink equals one 12 oz bottle of beer (355 mL), one 5 oz glass of wine (148 mL), or one 1 oz glass of hard liquor (44 mL). Lifestyle Do not use any products that contain nicotine or tobacco. These products include cigarettes, chewing tobacco, and vaping devices, such as e-cigarettes. If you need help quitting, ask your health care provider. Do not use street drugs. Do not share needles. Ask your health care provider for help if you need support or information about quitting drugs. General instructions Schedule regular health, dental, and eye exams. Stay current with your vaccines. Tell your health care provider if: You often feel depressed. You have ever been abused or do not feel safe at home. Summary Adopting a healthy lifestyle and getting preventive care are important in promoting health and wellness. Follow your health  care provider's instructions about healthy diet, exercising, and getting tested or screened for diseases. Follow your health care provider's instructions on monitoring your cholesterol and blood pressure. This information is not intended to replace advice given to you by your health care provider. Make sure you discuss any questions you have with your health care provider. Document Revised: 06/22/2020 Document Reviewed: 06/22/2020 Elsevier Patient Education  [place].

## 2021-10-03 NOTE — Progress Notes (Unsigned)
Subjective:    Patient ID: [name], female    DOB: 25-Feb-1945, 76 y.o.   MRN: [id number]      HPI Maeola is here for a Physical exam.    Feet swelling 50% of the time.  She is just sitting - not walking or doing much which is probably causing the swelling.  Not eating correctly.   Eating too many sugars.  Skin is dry and itchy.  Having increased RUQ abdominal pain - chronic.   NET-neuroendocrine tumor of duodenum.  Seeing GI and oncology.- to see oncology and surgery at [facility] for opinion - may need surgery  BP well controlled at home - 91/77 - 122/80,  56-74 - ? Fatigue related to low BP.     Medications and allergies reviewed with patient and updated if appropriate.  Current Outpatient Medications on File Prior to Visit  Medication Sig Dispense Refill   apixaban (ELIQUIS) 5 MG TABS tablet Take 1 tablet (5 mg total) by mouth 2 (two) times daily. 180 tablet 2   Ascorbic Acid (VITAMIN C GUMMIE PO) Take 282 mg by mouth every evening. Each tablet 141 mg     calcium carbonate (TUMS EX) 750 MG chewable tablet Chew 750 mg by mouth daily.     Cholecalciferol (VITAMIN D3 GUMMIES ADULT PO) Take 2,000 Units by mouth every evening.     clindamycin (CLEOCIN T) 1 % external solution Apply 1 application topically daily as needed (wounds).      clobetasol (TEMOVATE) 0.05 % external solution Apply topically daily.     Coenzyme Q10 (COQ10 GUMMIES ADULT PO) Take 200 mg by mouth in the morning and at bedtime.     CREON 36000-114000 units CPEP capsule Take 36,000 Units by mouth 2 (two) times daily with a meal.     diphenhydrAMINE (BENADRYL) 25 MG tablet Take 25 mg by mouth at bedtime.     FINACEA 15 % gel Apply topically 2 (two) times daily.     folic acid (FOLVITE) 1 MG tablet TAKE 1 TABLET BY MOUTH EVERY DAY 30 tablet 5   halobetasol (ULTRAVATE) 0.05 % cream APPLY TO AFFECTED AREA TWICE A DAY     icosapent Ethyl (VASCEPA) 1 g capsule Take 1 capsule (1 g total) by mouth 2 (two)  times daily. 360 capsule 3   Multiple Vitamins-Minerals (ADULT GUMMY PO) Take 1 tablet by mouth every evening. Children [employer] gummie formula     nebivolol (BYSTOLIC) 2.5 MG tablet TAKE 1 TABLET BY MOUTH EVERY DAY *BRAND BYSTOLIC* 30 tablet 5   omeprazole (PRILOSEC OTC) 20 MG tablet Take 20 mg by mouth daily.     ondansetron (ZOFRAN-ODT) 8 MG disintegrating tablet Take 1 tablet (8 mg total) by mouth every 8 (eight) hours as needed for nausea. 60 tablet 2   ONETOUCH ULTRA test strip USE TEST STRIPS TO CHECK BLOOD SUGAR DAILY AND AS NEEDED. E11.9 300 strip 1   oxymetazoline (AFRIN) 0.05 % nasal spray Place 1 spray into both nostrils 2 (two) times daily.     oxymetazoline (NASAL RELIEF) 0.05 % nasal spray Place into the nose.     polyethylene glycol (MIRALAX / GLYCOLAX) 17 g packet Take 17 g by mouth every other day.     rosuvastatin (CRESTOR) 5 MG tablet Take 1 tablet (5 mg total) by mouth 2 (two) times a week. Sometimes 3 times a week 45 tablet 2   Simethicone (GAS-X PO) Take 250 mg by mouth daily as needed.  vitamin B-12 (CYANOCOBALAMIN) 1000 MCG tablet Take 1,000 mcg by mouth daily.     VTAMA 1 % CREA Apply topically.     No current facility-administered medications on file prior to visit.    Review of Systems  Constitutional:  Positive for fatigue. Negative for diaphoresis (no flushes) and fever.  Eyes:  Negative for visual disturbance.  Respiratory:  Positive for cough (dry  - since on lisinopril), shortness of breath and wheezing (occ).   Cardiovascular:  Positive for leg swelling (50% of the time). Negative for chest pain and palpitations.  Gastrointestinal:  Positive for abdominal pain, constipation (chronic) and nausea. Negative for blood in stool and diarrhea.  Genitourinary:  Negative for dysuria.  Musculoskeletal:  Positive for back pain (horrible - has to walk with walker/shopping cart). Negative for arthralgias.  Skin:  Negative for rash.  Neurological:  Positive for  headaches (rare). Negative for dizziness and light-headedness.  Psychiatric/Behavioral:  Positive for dysphoric mood. The patient is nervous/anxious.        Objective:   Vitals:   10/05/21 0839  BP: (!) 146/86  Pulse: 70  Temp: 98.1 F (36.7 C)  SpO2: 96%   Filed Weights   10/05/21 0839  Weight: 146 lb (66.2 kg)   Body mass index is 28.51 kg/m.  BP Readings from Last 3 Encounters:  10/05/21 (!) 146/86  06/29/21 (!) 158/84  05/04/21 (!) 156/100    Wt Readings from Last 3 Encounters:  10/05/21 146 lb (66.2 kg)  06/29/21 147 lb 9 oz (66.9 kg)  05/19/21 146 lb (66.2 kg)       Physical Exam Constitutional: She appears well-developed and well-nourished. No distress.  HENT:  Head: Normocephalic and atraumatic.  Right Ear: External ear normal. Normal ear canal and TM Left Ear: External ear normal.  Normal ear canal and TM Mouth/Throat: Oropharynx is clear and moist.  Eyes: Conjunctivae normal.  Neck: Neck supple. No tracheal deviation present. No thyromegaly present.  No carotid bruit  Cardiovascular: Normal rate, regular rhythm and normal heart sounds.   No murmur heard.  Trace b/l ankle edema. Pulmonary/Chest: Effort normal and breath sounds normal. No respiratory distress. She has no wheezes. She has no rales.  Breast: deferred   Abdominal: Soft. She exhibits no distension. There is no tenderness.  Lymphadenopathy: She has no cervical adenopathy.  Skin: Skin is warm and dry. She is not diaphoretic.  Psychiatric: She has a normal mood and affect. Her behavior is normal.     Lab Results  Component Value Date   WBC 8.0 06/22/2021   HGB 12.5 06/22/2021   HCT 38.5 06/22/2021   PLT 295 06/22/2021   GLUCOSE 111 (H) 06/22/2021   CHOL 196 03/23/2021   TRIG 176.0 (H) 03/23/2021   HDL 52.30 03/23/2021   LDLDIRECT 193.0 02/24/2020   LDLCALC 109 (H) 03/23/2021   ALT 19 06/22/2021   AST 18 06/22/2021   NA 139 06/22/2021   K 4.6 06/22/2021   CL 103 06/22/2021    CREATININE 1.19 (H) 06/22/2021   BUN 21 06/22/2021   CO2 26 06/22/2021   TSH 2.63 11/11/2020   INR 1.9 (H) 06/23/2020   HGBA1C 6.7 (H) 03/23/2021   MICROALBUR <0.7 10/28/2015         Assessment & Plan:   Physical exam: Screening blood work  ordered Exercise  none - encouraged regular exercise Weight  mildly overweight Substance abuse  none   Reviewed recommended immunizations.   Health Maintenance  Topic Date Due  OPHTHALMOLOGY EXAM  07/01/2017   FOOT EXAM  05/02/2018   Diabetic kidney evaluation - Urine ACR  01/31/2020   HEMOGLOBIN A1C  09/20/2021   INFLUENZA VACCINE  05/15/2022 (Originally 09/14/2021)   Zoster Vaccines- Shingrix (1 of 2) 02/11/2024 (Originally 10/23/1964)   Diabetic kidney evaluation - GFR measurement  06/23/2022   TETANUS/TDAP  06/02/2024   Pneumonia Vaccine 77+ Years old  Completed   Hepatitis C Screening  Completed   HPV VACCINES  Aged Out   DEXA SCAN  Discontinued   COLONOSCOPY (Pts 45-24yrs Insurance coverage will need to be confirmed)  Discontinued   COVID-19 Vaccine  Discontinued     Eye exam not up to date  Defers dexa - will not take medication     See Problem List for Assessment and Plan of chronic medical problems.

## 2021-10-04 ENCOUNTER — Ambulatory Visit (INDEPENDENT_AMBULATORY_CARE_PROVIDER_SITE_OTHER): Payer: Medicare Other

## 2021-10-04 DIAGNOSIS — Z0000 Encounter for general adult medical examination without abnormal findings: Secondary | ICD-10-CM

## 2021-10-04 NOTE — Progress Notes (Signed)
Subjective:   [name] is a 76 y.o. female who presents for Medicare Annual (Subsequent) preventive examination.   I connected with [name]  today by telephone and verified that I am speaking with the correct person using two identifiers. Location patient: home Location provider: work Persons participating in the virtual visit: patient, provider.   I discussed the limitations, risks, security and privacy concerns of performing an evaluation and management service by telephone and the availability of in person appointments. I also discussed with the patient that there may be a patient responsible charge related to this service. The patient expressed understanding and verbally consented to this telephonic visit.    Interactive audio and video telecommunications were attempted between this provider and patient, however failed, due to patient having technical difficulties OR patient did not have access to video capability.  We continued and completed visit with audio only.    Review of Systems     Cardiac Risk Factors include: advanced age (>27men, >28 women);diabetes mellitus;dyslipidemia;hypertension     Objective:    Today's Vitals   There is no height or weight on file to calculate BMI.     10/04/2021    9:02 AM 12/30/2020   10:36 AM 08/06/2020    6:52 AM 02/24/2020    8:50 AM 03/07/2019   10:21 AM 01/30/2019    4:37 PM 01/05/2019    2:00 AM  Advanced Directives  Does Patient Have a Medical Advance Directive? Yes Yes Yes Yes Yes No No  Type of Paramedic of [place];Living will Healthcare Power of [place] of [place];Living will    Does patient want to make changes to medical advance directive?  No - Patient declined  No - Patient declined     Copy of [place] in Chart? No - copy requested No - copy requested No - copy requested  No - copy requested    Would patient like  information on creating a medical advance directive?      No - Patient declined No - Guardian declined    Current Medications (verified) Outpatient Encounter Medications as of 10/04/2021  Medication Sig   apixaban (ELIQUIS) 5 MG TABS tablet Take 1 tablet (5 mg total) by mouth 2 (two) times daily.   Ascorbic Acid (VITAMIN C GUMMIE PO) Take 282 mg by mouth every evening. Each tablet 141 mg   calcium carbonate (TUMS EX) 750 MG chewable tablet Chew 750 mg by mouth daily.   Cholecalciferol (VITAMIN D3 GUMMIES ADULT PO) Take 2,000 Units by mouth every evening.   clindamycin (CLEOCIN T) 1 % external solution Apply 1 application topically daily as needed (wounds).    Coenzyme Q10 (COQ10 GUMMIES ADULT PO) Take 200 mg by mouth in the morning and at bedtime.   CREON 36000-114000 units CPEP capsule Take 36,000 Units by mouth 2 (two) times daily with a meal.   diphenhydrAMINE (BENADRYL) 25 MG tablet Take 25 mg by mouth at bedtime.   folic acid (FOLVITE) 1 MG tablet TAKE 1 TABLET BY MOUTH EVERY DAY   halobetasol (ULTRAVATE) 0.05 % cream APPLY TO AFFECTED AREA TWICE A DAY   icosapent Ethyl (VASCEPA) 1 g capsule Take 1 capsule (1 g total) by mouth 2 (two) times daily.   lisinopril (ZESTRIL) 10 MG tablet TAKE 1 TABLET BY MOUTH EVERY DAY   Multiple Vitamins-Minerals (ADULT GUMMY PO) Take 1 tablet by mouth every evening. [place] gummie formula  nebivolol (BYSTOLIC) 2.5 MG tablet TAKE 1 TABLET BY MOUTH EVERY DAY *BRAND BYSTOLIC*   omeprazole (PRILOSEC OTC) 20 MG tablet Take 20 mg by mouth daily.   ondansetron (ZOFRAN-ODT) 8 MG disintegrating tablet Take 1 tablet (8 mg total) by mouth every 8 (eight) hours as needed for nausea.   ONETOUCH ULTRA test strip USE TEST STRIPS TO CHECK BLOOD SUGAR DAILY AND AS NEEDED. E11.9   oxymetazoline (AFRIN) 0.05 % nasal spray Place 1 spray into both nostrils 2 (two) times daily.   polyethylene glycol (MIRALAX / GLYCOLAX) 17 g packet Take 17 g by mouth every other  day.   rosuvastatin (CRESTOR) 5 MG tablet Take 1 tablet (5 mg total) by mouth 2 (two) times a week. Sometimes 3 times a week   Simethicone (GAS-X PO) Take 250 mg by mouth daily as needed.   vitamin B-12 (CYANOCOBALAMIN) 1000 MCG tablet Take 1,000 mcg by mouth daily.   No facility-administered encounter medications on file as of 10/04/2021.    Allergies (verified) Phenergan [promethazine hcl], Repatha [evolocumab], Amlodipine, Clarithromycin, Codeine, Erythromycin, Hydralazine hcl, Statins, and Tetracycline   History: Past Medical History:  Diagnosis Date   ALLERGIC RHINITIS    ANEMIA-NOS    Arthritis    ASTHMA    Breast discharge    Carpal tunnel syndrome    COLONIC POLYPS, HX OF    Coronary artery disease    "mild CAD" noted on 12/05/17 in coronary CT scan   DIABETES MELLITUS, TYPE II    diet controlled   GERD    Hand tingling    HYPERLIPIDEMIA    HYPERTENSION    Neuroendocrine tumor    OSTEOPENIA    PONV (postoperative nausea and vomiting)    " it relaxes my bladder muscles and I have been known to pee all over the place"   Psoriasis    severe, began soriatane 01/2012   Pulmonary embolism ([place])    Rectal fissure    Scoliosis    Wears glasses    Past Surgical History:  Procedure Laterality Date   benign rectal growth  2004   removed by Dr. Zella Richer   BIOPSY  08/09/2018   Procedure: BIOPSY;  Surgeon: Carol Ada, MD;  Location: [place];  Service: Endoscopy;;   BIOPSY  08/22/2018   Procedure: BIOPSY;  Surgeon: Juanita Craver, MD;  Location: [facility] ENDOSCOPY;  Service: Endoscopy;;   BIOPSY  03/07/2019   Procedure: BIOPSY;  Surgeon: Irving Copas., MD;  Location: [place];  Service: Gastroenterology;;   BIOPSY  08/06/2020   Procedure: BIOPSY;  Surgeon: Irving Copas., MD;  Location: [facility] ENDOSCOPY;  Service: Gastroenterology;;   BREAST BIOPSY Right    BREAST EXCISIONAL BIOPSY Left    BREAST SURGERY  1988   biopsy   CESAREAN SECTION      COLONOSCOPY N/A 08/22/2018   Procedure: COLONOSCOPY;  Surgeon: Juanita Craver, MD;  Location: [facility] ENDOSCOPY;  Service: Endoscopy;  Laterality: N/A;   ENDOSCOPIC MUCOSAL RESECTION N/A 03/07/2019   Procedure: ENDOSCOPIC MUCOSAL RESECTION;  Surgeon: Rush Landmark Telford Nab., MD;  Location: [place];  Service: Gastroenterology;  Laterality: N/A;   ESOPHAGOGASTRODUODENOSCOPY (EGD) WITH PROPOFOL N/A 08/04/2018   Procedure: ESOPHAGOGASTRODUODENOSCOPY (EGD) WITH PROPOFOL;  Surgeon: Ladene Artist, MD;  Location: [place];  Service: Gastroenterology;  Laterality: N/A;   ESOPHAGOGASTRODUODENOSCOPY (EGD) WITH PROPOFOL N/A 03/07/2019   Procedure: ESOPHAGOGASTRODUODENOSCOPY (EGD) WITH PROPOFOL;  Surgeon: Rush Landmark Telford Nab., MD;  Location: [place];  Service: Gastroenterology;  Laterality: N/A;   ESOPHAGOGASTRODUODENOSCOPY (EGD)  WITH PROPOFOL N/A 08/06/2020   Procedure: ESOPHAGOGASTRODUODENOSCOPY (EGD) WITH PROPOFOL;  Surgeon: Rush Landmark Telford Nab., MD;  Location: [place];  Service: Gastroenterology;  Laterality: N/A;   EUS N/A 03/07/2019   Procedure: UPPER ENDOSCOPIC ULTRASOUND (EUS) RADIAL;  Surgeon: Irving Copas., MD;  Location: [place];  Service: Gastroenterology;  Laterality: N/A;   EUS N/A 08/06/2020   Procedure: UPPER ENDOSCOPIC ULTRASOUND (EUS) RADIAL;  Surgeon: Irving Copas., MD;  Location: [place];  Service: Gastroenterology;  Laterality: N/A;   FINE NEEDLE ASPIRATION  03/07/2019   Procedure: FINE NEEDLE ASPIRATION (FNA) LINEAR;  Surgeon: Irving Copas., MD;  Location: [place];  Service: Gastroenterology;;   FINE NEEDLE ASPIRATION  08/06/2020   Procedure: FINE NEEDLE ASPIRATION;  Surgeon: Irving Copas., MD;  Location: [place];  Service: Gastroenterology;;   Otho Darner SIGMOIDOSCOPY N/A 08/04/2018   Procedure: Beryle Quant;  Surgeon: Ladene Artist, MD;  Location: [place];  Service: Gastroenterology;  Laterality: N/A;    FLEXIBLE SIGMOIDOSCOPY N/A 08/09/2018   Procedure: FLEXIBLE SIGMOIDOSCOPY;  Surgeon: Carol Ada, MD;  Location: [place];  Service: Endoscopy;  Laterality: N/A;   HEMOSTASIS CLIP PLACEMENT  03/07/2019   Procedure: HEMOSTASIS CLIP PLACEMENT;  Surgeon: Rush Landmark Telford Nab., MD;  Location: [place];  Service: Gastroenterology;;   SUBMUCOSAL LIFTING INJECTION  03/07/2019   Procedure: SUBMUCOSAL LIFTING INJECTION;  Surgeon: Irving Copas., MD;  Location: [place];  Service: Gastroenterology;;   VIDEO BRONCHOSCOPY WITH ENDOBRONCHIAL NAVIGATION N/A 03/14/2018   Procedure: VIDEO BRONCHOSCOPY WITH ENDOBRONCHIAL NAVIGATION;  Surgeon: Collene Gobble, MD;  Location: [facility] OR;  Service: Thoracic;  Laterality: N/A;   Family History  Problem Relation Age of Onset   Lung cancer Father    Arthritis Other        Parents   Asthma Other        parent, other relative   Breast cancer Other        other relative   Hypertension Other        parent, other relative   Heart disease Other        parent, other relative   Heart disease Mother    Asthma Mother    Breast cancer Maternal Aunt 68   Parkinson's disease Maternal Grandmother    Rheumatic fever Maternal Grandfather    Social History   Socioeconomic History   Marital status: Single    Spouse name: Not on file   Number of children: 1   Years of education: 16   Highest education level: Bachelor's degree (e.g., BA, AB, BS)  Occupational History   Occupation: retired Education officer, 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  Has one child and one grandchild.  Retired Careers adviser.  Education: college.    Social Determinants of Health   Financial Resource Strain: Low Risk  (10/04/2021)   Overall Financial Resource Strain (CARDIA)    Difficulty of  Paying Living Expenses: Not hard at all  Food Insecurity: No Food Insecurity (10/04/2021)   Hunger Vital Sign    Worried About Running Out of Food in the Last Year: Never true    Ran Out of Food in the Last Year: Never true  Transportation Needs: No Transportation Needs (10/04/2021)   PRAPARE - Hydrologist (Medical): No    Lack of Transportation (Non-Medical): No  Physical Activity: Insufficiently Active (10/04/2021)   Exercise Vital Sign    Days of Exercise per Week: 3 days    Minutes of Exercise per Session: 30 min  Stress: No Stress Concern Present (10/04/2021)   [place]    Feeling of Stress : Not at all  Social Connections: Socially Isolated (10/04/2021)   Social Connection and Isolation Panel [NHANES]    Frequency of Communication with Friends and Family: Three times a week    Frequency of Social Gatherings with Friends and Family: Three times a week    Attends Religious Services: Never    Active Member of Clubs or Organizations: No    Attends Archivist Meetings: Never    Marital Status: Widowed    Tobacco Counseling Counseling given: Not Answered   Clinical Intake:  Pre-visit preparation completed: Yes  Pain : No/denies pain     Nutritional Risks: None Diabetes: No  How often do you need to have someone help you when you read instructions, pamphlets, or other written materials from your doctor or pharmacy?: 1 - Never What is the last grade level you completed in school?: college  Diabetic?yes Nutrition Risk Assessment:  Has the patient had any N/V/D within the last 2 months?  Yes   Does the patient have any non-healing wounds?  No  Has the patient had any unintentional weight loss or weight gain?  Yes   Diabetes:  Is the patient diabetic?  Yes  If diabetic, was a CBG obtained today?  No  Did the patient bring in their glucometer from home?  No  How often do you monitor your CBG's? Once a day .   Financial Strains and Diabetes Management:  Are you having any financial strains with the device, your supplies or your medication? No .  Does the patient want to be seen by Chronic Care Management for management of their diabetes?  No  Would the patient like to be referred to a Nutritionist or for Diabetic Management?  No   Diabetic Exams:  Diabetic Eye Exam: Overdue for diabetic eye exam. Pt has been advised about the importance in completing this exam. Patient advised to call and schedule an eye exam. Diabetic Foot Exam: Overdue, Pt has been advised about the importance in completing this exam. Pt is scheduled for diabetic foot exam on next office visit .   Interpreter Needed?: No  Information entered by :: L.Wilson,LPN   Activities of Daily Living    10/04/2021    9:07 AM  In your present state of health, do you have any difficulty performing the following activities:  Hearing? 0  Vision? 0  Difficulty concentrating or making decisions? 0  Walking or climbing stairs? 0  Dressing or bathing? 0  Doing errands, shopping? 0  Preparing Food and eating ? N  Using the Toilet? N  In the past six months, have you accidently leaked urine? N  Do you have problems with loss of bowel control? N  Managing your Medications? N  Managing your Finances? N  Housekeeping or managing your Housekeeping? N    Patient Care Team: Freada Bergeron, MD as PCP - Cardiology (Cardiology) Rolm Bookbinder, MD (Dermatology) Marica Otter, OD (Optometry) Juanita Craver, MD as Consulting Physician (Gastroenterology) Starling Manns, MD (Orthopedic Surgery) Szabat, Darnelle Maffucci, [facility]  (Inactive) as Pharmacist (Pharmacist)  Indicate any recent Medical Services you may have received from other than [facility] providers in the past year (date may be approximate).     Assessment:   This is a routine wellness examination for Piya.  Hearing/Vision screen Vision Screening - Comments:: Patient to schedule , wears glasses   Dietary issues and exercise activities discussed: Current Exercise Habits: Home exercise routine, Type of exercise: walking, Time (Minutes): 30, Frequency (Times/Week): 2, Weekly Exercise (Minutes/Week): 60, Intensity: Mild, Exercise limited by: orthopedic condition(s)   Goals Addressed               This Visit's Progress     Patient Stated (pt-stated)   On track     To increase my activity within my physical limitations.       Depression Screen    10/04/2021    9:07 AM 10/04/2021    9:03 AM 10/04/2021    9:00 AM 03/23/2021    9:18 AM 02/24/2020    8:50 AM 01/02/2019    2:59 PM 01/10/2018    1:22 PM  PHQ 2/9 Scores  PHQ - 2 Score 0 0 0 2 1 0 0    Fall Risk    10/04/2021    9:03 AM 03/23/2021    9:18 AM 02/24/2020    8:50 AM 01/02/2019    2:58 PM 07/31/2018   10:09 AM  Fall Risk   Falls in the past year? 0 0 1 0 1  Number falls in past yr: 0 0 0 0 0  Injury with Fall? 0 0 0 0 1  Follow up Falls evaluation completed;Education provided        FALL RISK PREVENTION PERTAINING TO THE HOME:  Any stairs in or around the home? No  If so, are there any without handrails? No  Home free of loose throw rugs in walkways, pet beds, electrical cords, etc? Yes  Adequate lighting in your home to reduce risk of falls? Yes   ASSISTIVE DEVICES UTILIZED TO PREVENT FALLS:  Life alert? No  Use of a cane, walker or w/c? Yes  Grab bars in the bathroom? No  Shower chair or bench in shower? Yes  Elevated toilet seat or a handicapped toilet? No   Cognitive Function:  Normal cognitive status assessed by telephone conversation  by this Nurse Health Advisor.  No abnormalities found.        10/04/2021    9:08 AM  6CIT Screen  What Year? 0 points  What month? 0 points  What time? 0 points  Count back from 20 0 points  Months in reverse 0 points  Repeat phrase 0 points  Total Score 0 points    Immunizations Immunization History  Administered Date(s) Administered   Fluad Quad(high Dose 65+) 10/23/2018, 11/05/2019, 11/11/2020   Influenza Split 11/29/2010, 11/15/2011   Influenza, High Dose Seasonal PF 12/28/2012, 10/28/2015, 10/31/2016, 10/30/2017   Influenza,inj,Quad PF,6+ Mos 10/22/2013, 11/07/2014   PFIZER(Purple Top)SARS-COV-2 Vaccination 03/13/2019, 04/03/2019, 10/15/2019, 06/12/2020, 10/15/2020   PPD Test 08/27/2012, 11/06/2014, 01/18/2016   Pfizer Covid-19 Vaccine Bivalent Booster 47yrs & up 11/02/2020, 08/24/2021   Pneumococcal Conjugate-13 10/22/2013   Pneumococcal Polysaccharide-23 02/14/2002, 12/23/2014   Td  02/15/2004, 06/03/2014    TDAP status: Up to date  Flu Vaccine status: Up to date  Pneumococcal vaccine status: Up to date  Covid-19 vaccine status: Completed vaccines  Qualifies for Shingles Vaccine? Yes   Zostavax completed No   Shingrix Completed?: No.    Education has been provided regarding the importance of this vaccine. Patient has been advised to call insurance company to determine out of pocket expense if they have not yet received this vaccine. Advised may also receive vaccine at local pharmacy or Health Dept. Verbalized acceptance and understanding.  Screening Tests Health Maintenance  Topic Date Due   OPHTHALMOLOGY EXAM  07/01/2017   FOOT EXAM  05/02/2018   INFLUENZA VACCINE  09/14/2021   HEMOGLOBIN A1C  09/20/2021   DEXA SCAN  11/12/2022 (Originally 03/09/2014)   Zoster Vaccines- Shingrix (1 of 2) 02/11/2024 (Originally 10/23/1964)   TETANUS/TDAP  06/02/2024   Pneumonia Vaccine 73+ Years old  Completed   Hepatitis C Screening  Completed   HPV VACCINES  Aged Out   COLONOSCOPY (Pts 45-40yrs Insurance  coverage will need to be confirmed)  Discontinued   COVID-19 Vaccine  Discontinued    Health Maintenance  Health Maintenance Due  Topic Date Due   OPHTHALMOLOGY EXAM  07/01/2017   FOOT EXAM  05/02/2018   INFLUENZA VACCINE  09/14/2021   HEMOGLOBIN A1C  09/20/2021    Colorectal cancer screening: No longer required.   Mammogram status: No longer required due to age.  Bone Density status: Ordered patient declined . Pt provided with contact info and advised to call to schedule appt.  Lung Cancer Screening: (Low Dose CT Chest recommended if Age 64-80 years, 30 pack-year currently smoking OR have quit w/in 15years.) does not qualify.   Lung Cancer Screening Referral: n/a  Additional Screening:  Hepatitis C Screening: does not qualify; Completed 04/22/2015  Vision Screening: Recommended annual ophthalmology exams for early detection of glaucoma and other disorders of the eye. Is the patient up to date with their annual eye exam?  No  Who is the provider or what is the name of the office in which the patient attends annual eye exams? Patient declined referral states she will schedule  If pt is not established with a provider, would they like to be referred to a provider to establish care? No .   Dental Screening: Recommended annual dental exams for proper oral hygiene  Community Resource Referral / Chronic Care Management: CRR required this visit?  No   CCM required this visit?  No      Plan:     I have personally reviewed and noted the following in the patient's chart:   Medical and social history Use of alcohol, tobacco or illicit drugs  Current medications and supplements including opioid prescriptions. Patient is not currently taking opioid prescriptions. Functional ability and status Nutritional status Physical activity Advanced directives List of other physicians Hospitalizations, surgeries, and ER visits in previous 12 months Vitals Screenings to include  cognitive, depression, and falls Referrals and appointments  In addition, I have reviewed and discussed with patient certain preventive protocols, quality metrics, and best practice recommendations. A written personalized care plan for preventive services as well as general preventive health recommendations were provided to patient.     Daphane Shepherd, LPN   06/24/2583   Nurse Notes: none

## 2021-10-04 NOTE — Patient Instructions (Signed)
[name] , Thank you for taking time to come for your Medicare Wellness Visit. I appreciate your ongoing commitment to your health goals. Please review the following plan we discussed and let me know if I can assist you in the future.   Screening recommendations/referrals: Colonoscopy: no longer required  Mammogram: no longer required  Bone Density: patient declined referral  Recommended yearly ophthalmology/optometry visit for glaucoma screening and checkup Recommended yearly dental visit for hygiene and checkup  Vaccinations: Influenza vaccine: completed  Pneumococcal vaccine: completed  Tdap vaccine: 06/03/2014 due 2026  Shingles vaccine: declined     Advanced directives: yes   Conditions/risks identified: none   Next appointment: none    Preventive Care 35 Years and Older, Female Preventive care refers to lifestyle choices and visits with your health care provider that can promote health and wellness. What does preventive care include? A yearly physical exam. This is also called an annual well check. Dental exams once or twice a year. Routine eye exams. Ask your health care provider how often you should have your eyes checked. Personal lifestyle choices, including: Daily care of your teeth and gums. Regular physical activity. Eating a healthy diet. Avoiding tobacco and drug use. Limiting alcohol use. Practicing safe sex. Taking low-dose aspirin every day. Taking vitamin and mineral supplements as recommended by your health care provider. What happens during an annual well check? The services and screenings done by your health care provider during your annual well check will depend on your age, overall health, lifestyle risk factors, and family history of disease. Counseling  Your health care provider may ask you questions about your: Alcohol use. Tobacco use. Drug use. Emotional well-being. Home and relationship well-being. Sexual activity. Eating  habits. History of falls. Memory and ability to understand (cognition). Work and work Statistician. Reproductive health. Screening  You may have the following tests or measurements: Height, weight, and BMI. Blood pressure. Lipid and cholesterol levels. These may be checked every 5 years, or more frequently if you are over 72 years old. Skin check. Lung cancer screening. You may have this screening every year starting at age 41 if you have a 30-pack-year history of smoking and currently smoke or have quit within the past 15 years. Fecal occult blood test (FOBT) of the stool. You may have this test every year starting at age 45. Flexible sigmoidoscopy or colonoscopy. You may have a sigmoidoscopy every 5 years or a colonoscopy every 10 years starting at age 52. Hepatitis C blood test. Hepatitis B blood test. Sexually transmitted disease (STD) testing. Diabetes screening. This is done by checking your blood sugar (glucose) after you have not eaten for a while (fasting). You may have this done every 1-3 years. Bone density scan. This is done to screen for osteoporosis. You may have this done starting at age 40. Mammogram. This may be done every 1-2 years. Talk to your health care provider about how often you should have regular mammograms. Talk with your health care provider about your test results, treatment options, and if necessary, the need for more tests. Vaccines  Your health care provider may recommend certain vaccines, such as: Influenza vaccine. This is recommended every year. Tetanus, diphtheria, and acellular pertussis (Tdap, Td) vaccine. You may need a Td booster every 10 years. Zoster vaccine. You may need this after age 29. Pneumococcal 13-valent conjugate (PCV13) vaccine. One dose is recommended after age 28. Pneumococcal polysaccharide (PPSV23) vaccine. One dose is recommended after age 18. Talk to your health care  provider about which screenings and vaccines you need and how  often you need them. This information is not intended to replace advice given to you by your health care provider. Make sure you discuss any questions you have with your health care provider. Document Released: 02/27/2015 Document Revised: 10/21/2015 Document Reviewed: 12/02/2014 Elsevier Interactive Patient Education  2017 [place] Prevention in the Home Falls can cause injuries. They can happen to people of all ages. There are many things you can do to make your home safe and to help prevent falls. What can I do on the outside of my home? Regularly fix the edges of walkways and driveways and fix any cracks. Remove anything that might make you trip as you walk through a door, such as a raised step or threshold. Trim any bushes or trees on the path to your home. Use bright outdoor lighting. Clear any walking paths of anything that might make someone trip, such as rocks or tools. Regularly check to see if handrails are loose or broken. Make sure that both sides of any steps have handrails. Any raised decks and porches should have guardrails on the edges. Have any leaves, snow, or ice cleared regularly. Use sand or salt on walking paths during winter. Clean up any spills in your garage right away. This includes oil or grease spills. What can I do in the bathroom? Use night lights. Install grab bars by the toilet and in the tub and shower. Do not use towel bars as grab bars. Use non-skid mats or decals in the tub or shower. If you need to sit down in the shower, use a plastic, non-slip stool. Keep the floor dry. Clean up any water that spills on the floor as soon as it happens. Remove soap buildup in the tub or shower regularly. Attach bath mats securely with double-sided non-slip rug tape. Do not have throw rugs and other things on the floor that can make you trip. What can I do in the bedroom? Use night lights. Make sure that you have a light by your bed that is easy to  reach. Do not use any sheets or blankets that are too big for your bed. They should not hang down onto the floor. Have a firm chair that has side arms. You can use this for support while you get dressed. Do not have throw rugs and other things on the floor that can make you trip. What can I do in the kitchen? Clean up any spills right away. Avoid walking on wet floors. Keep items that you use a lot in easy-to-reach places. If you need to reach something above you, use a strong step stool that has a grab bar. Keep electrical cords out of the way. Do not use floor polish or wax that makes floors slippery. If you must use wax, use non-skid floor wax. Do not have throw rugs and other things on the floor that can make you trip. What can I do with my stairs? Do not leave any items on the stairs. Make sure that there are handrails on both sides of the stairs and use them. Fix handrails that are broken or loose. Make sure that handrails are as long as the stairways. Check any carpeting to make sure that it is firmly attached to the stairs. Fix any carpet that is loose or worn. Avoid having throw rugs at the top or bottom of the stairs. If you do have throw rugs, attach them to the  floor with carpet tape. Make sure that you have a light switch at the top of the stairs and the bottom of the stairs. If you do not have them, ask someone to add them for you. What else can I do to help prevent falls? Wear shoes that: Do not have high heels. Have rubber bottoms. Are comfortable and fit you well. Are closed at the toe. Do not wear sandals. If you use a stepladder: Make sure that it is fully opened. Do not climb a closed stepladder. Make sure that both sides of the stepladder are locked into place. Ask someone to hold it for you, if possible. Clearly mark and make sure that you can see: Any grab bars or handrails. First and last steps. Where the edge of each step is. Use tools that help you move  around (mobility aids) if they are needed. These include: Canes. Walkers. Scooters. Crutches. Turn on the lights when you go into a dark area. Replace any light bulbs as soon as they burn out. Set up your furniture so you have a clear path. Avoid moving your furniture around. If any of your floors are uneven, fix them. If there are any pets around you, be aware of where they are. Review your medicines with your doctor. Some medicines can make you feel dizzy. This can increase your chance of falling. Ask your doctor what other things that you can do to help prevent falls. This information is not intended to replace advice given to you by your health care provider. Make sure you discuss any questions you have with your health care provider. Document Released: 11/27/2008 Document Revised: 07/09/2015 Document Reviewed: 03/07/2014 Elsevier Interactive Patient Education  2017 [employer].

## 2021-10-05 ENCOUNTER — Ambulatory Visit (INDEPENDENT_AMBULATORY_CARE_PROVIDER_SITE_OTHER): Payer: Medicare Other | Admitting: Internal Medicine

## 2021-10-05 VITALS — BP 146/86 | HR 70 | Temp 98.1°F | Ht 60.0 in | Wt 146.0 lb

## 2021-10-05 DIAGNOSIS — E118 Type 2 diabetes mellitus with unspecified complications: Secondary | ICD-10-CM | POA: Diagnosis not present

## 2021-10-05 DIAGNOSIS — Z0000 Encounter for general adult medical examination without abnormal findings: Secondary | ICD-10-CM

## 2021-10-05 DIAGNOSIS — R600 Localized edema: Secondary | ICD-10-CM

## 2021-10-05 DIAGNOSIS — E559 Vitamin D deficiency, unspecified: Secondary | ICD-10-CM | POA: Diagnosis not present

## 2021-10-05 DIAGNOSIS — E785 Hyperlipidemia, unspecified: Secondary | ICD-10-CM

## 2021-10-05 DIAGNOSIS — R5383 Other fatigue: Secondary | ICD-10-CM

## 2021-10-05 DIAGNOSIS — M85839 Other specified disorders of bone density and structure, unspecified forearm: Secondary | ICD-10-CM

## 2021-10-05 DIAGNOSIS — I10 Essential (primary) hypertension: Secondary | ICD-10-CM | POA: Diagnosis not present

## 2021-10-05 DIAGNOSIS — N1831 Chronic kidney disease, stage 3a: Secondary | ICD-10-CM

## 2021-10-05 DIAGNOSIS — E538 Deficiency of other specified B group vitamins: Secondary | ICD-10-CM | POA: Diagnosis not present

## 2021-10-05 DIAGNOSIS — F32A Depression, unspecified: Secondary | ICD-10-CM

## 2021-10-05 DIAGNOSIS — K8681 Exocrine pancreatic insufficiency: Secondary | ICD-10-CM

## 2021-10-05 DIAGNOSIS — D3A8 Other benign neuroendocrine tumors: Secondary | ICD-10-CM

## 2021-10-05 LAB — CBC WITH DIFFERENTIAL/PLATELET
Basophils Absolute: 0.1 10*3/uL (ref 0.0–0.1)
Basophils Relative: 1 % (ref 0.0–3.0)
Eosinophils Absolute: 0.3 10*3/uL (ref 0.0–0.7)
Eosinophils Relative: 3.9 % (ref 0.0–5.0)
HCT: 38.8 % (ref 36.0–46.0)
Hemoglobin: 12.8 g/dL (ref 12.0–15.0)
Lymphocytes Relative: 22 % (ref 12.0–46.0)
Lymphs Abs: 1.7 10*3/uL (ref 0.7–4.0)
MCHC: 33.1 g/dL (ref 30.0–36.0)
MCV: 92.3 fl (ref 78.0–100.0)
Monocytes Absolute: 0.5 10*3/uL (ref 0.1–1.0)
Monocytes Relative: 6.6 % (ref 3.0–12.0)
Neutro Abs: 5.2 10*3/uL (ref 1.4–7.7)
Neutrophils Relative %: 66.5 % (ref 43.0–77.0)
Platelets: 290 10*3/uL (ref 150.0–400.0)
RBC: 4.21 Mil/uL (ref 3.87–5.11)
RDW: 15.4 % (ref 11.5–15.5)
WBC: 7.8 10*3/uL (ref 4.0–10.5)

## 2021-10-05 LAB — COMPREHENSIVE METABOLIC PANEL
ALT: 20 U/L (ref 0–35)
AST: 19 U/L (ref 0–37)
Albumin: 4 g/dL (ref 3.5–5.2)
Alkaline Phosphatase: 79 U/L (ref 39–117)
BUN: 17 mg/dL (ref 6–23)
CO2: 27 mEq/L (ref 19–32)
Calcium: 9.7 mg/dL (ref 8.4–10.5)
Chloride: 100 mEq/L (ref 96–112)
Creatinine, Ser: 1.01 mg/dL (ref 0.40–1.20)
GFR: 53.53 mL/min — ABNORMAL LOW (ref >60.00)
Glucose, Bld: 116 mg/dL — ABNORMAL HIGH (ref 70–99)
Potassium: 4.5 mEq/L (ref 3.5–5.1)
Sodium: 137 mEq/L (ref 135–145)
Total Bilirubin: 0.6 mg/dL (ref 0.2–1.2)
Total Protein: 7.4 g/dL (ref 6.0–8.3)

## 2021-10-05 LAB — LIPID PANEL
Cholesterol: 205 mg/dL — ABNORMAL HIGH (ref 0–200)
HDL: 48.2 mg/dL (ref >39.00)
NonHDL: 156.98
Total CHOL/HDL Ratio: 4
Triglycerides: 239 mg/dL — ABNORMAL HIGH (ref 0.0–149.0)
VLDL: 47.8 mg/dL — ABNORMAL HIGH (ref 0.0–40.0)

## 2021-10-05 LAB — IBC PANEL
Iron: 79 ug/dL (ref 42–145)
Saturation Ratios: 16.5 % — ABNORMAL LOW (ref 20.0–50.0)
TIBC: 477.4 ug/dL — ABNORMAL HIGH (ref 250.0–450.0)
Transferrin: 341 mg/dL (ref 212.0–360.0)

## 2021-10-05 LAB — MICROALBUMIN / CREATININE URINE RATIO
Creatinine,U: 59.3 mg/dL
Microalb Creat Ratio: 1.2 mg/g (ref 0.0–30.0)
Microalb, Ur: <0.7 mg/dL (ref 0.0–1.9)

## 2021-10-05 LAB — LDL CHOLESTEROL, DIRECT: Direct LDL: 113 mg/dL

## 2021-10-05 LAB — FERRITIN: Ferritin: 19.6 ng/mL (ref 10.0–291.0)

## 2021-10-05 LAB — HEMOGLOBIN A1C: Hgb A1c MFr Bld: 6.9 % — ABNORMAL HIGH (ref 4.6–6.5)

## 2021-10-05 LAB — VITAMIN D 25 HYDROXY (VIT D DEFICIENCY, FRACTURES): VITD: 59.15 ng/mL (ref 30.00–100.00)

## 2021-10-05 LAB — VITAMIN B12: Vitamin B-12: 1146 pg/mL — ABNORMAL HIGH (ref 211–911)

## 2021-10-05 MED ORDER — LISINOPRIL 10 MG PO TABS
5.0000 mg | ORAL_TABLET | Freq: Every day | ORAL | 5 refills | Status: DC
Start: 2021-10-05 — End: 2022-02-28

## 2021-10-05 NOTE — Assessment & Plan Note (Signed)
Chronic With hyperlipidemia  Lab Results  Component Value Date   HGBA1C 6.7 (H) 03/23/2021   Sugars  controlled Check A1c, urine microalbumin today Continue diet control Stressed regular exercise, diabetic diet

## 2021-10-05 NOTE — Assessment & Plan Note (Signed)
Chronic DEXA due-she deferred Stressed regular exercise Stressed calcium and vitamin D daily

## 2021-10-05 NOTE — Assessment & Plan Note (Signed)
Chronic Following with Dr. [name] CMP, CBC, vitamin D

## 2021-10-05 NOTE — Assessment & Plan Note (Signed)
Chronic Regular exercise and healthy diet encouraged Check lipid panel  Continue Crestor 5 mg twice weekly

## 2021-10-05 NOTE — Assessment & Plan Note (Addendum)
Chronic Management per GI Currently on Creon - dose increased two months ago

## 2021-10-05 NOTE — Assessment & Plan Note (Signed)
Chronic Taking vitamin D daily Check vitamin D level  

## 2021-10-05 NOTE — Assessment & Plan Note (Addendum)
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

## 2021-10-05 NOTE — Assessment & Plan Note (Addendum)
Chronic Blood pressure over-controlled at home - has white coat htn - always elevated here CMP Continue nebivolol 2.5 mg daily Decrease lisinopril to 5 mg daily since BP on low side at home and having fatigue

## 2021-10-05 NOTE — Assessment & Plan Note (Signed)
Chronic Taking B12 supplementation Check B12 level

## 2021-10-05 NOTE — Assessment & Plan Note (Signed)
Chronic - worse ? Related to low BP  BP well controlled - too well controlled per home readings Will dec lisinopril to 5 mg daily and monitor BP Not sleeping great which may be contributing

## 2021-10-05 NOTE — Assessment & Plan Note (Signed)
Chronic - worse now - has edema 50% of the time Likely from sitting too much and being sedentary Encouraged increased walking

## 2021-10-19 ENCOUNTER — Other Ambulatory Visit: Payer: Self-pay | Admitting: Internal Medicine

## 2021-10-28 NOTE — Progress Notes (Deleted)
Cardiology Office Note:    Date:  10/28/2021   ID:  [name], DOB 10-Jul-1945, MRN [id number]  PCP:  [name], MD   [facility] Providers Cardiologist:  Freada Bergeron, MD {   Referring MD: [name], MD    History of Present Illness:    [name] is a 76 y.o. female with a hx of difficult to control hypertension, hyperlipidemia, mild nonobstructive CAD, PE, aortic atherosclerosis who was previously followed by Dr. [name] who now returns to clinic for follow-up.  Per review of the record, Cardiac CTA 11/2017 with mild nonobstructive CAD. She has had difficulty with lipid control. Intolerant to pravastatin, atorvastatin, pitavastatin. She reports Repatha caused "paralysis". She has concerns about fish oil and bleeding. She was started on crestor 5mg  twice per week.  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 Was also complaining of dyspnea on exertion. We obtained a myoview 05/2021 which was negative. TTE showed LVEF 60-65%, mild LVH, normal RV, no significant valve disease.   Today, ***  Past Medical History:  Diagnosis Date   ALLERGIC RHINITIS    ANEMIA-NOS    Arthritis    ASTHMA    Breast discharge    Carpal tunnel syndrome    COLONIC POLYPS, HX OF    Coronary artery disease    "mild CAD" noted on 12/05/17 in coronary CT scan   DIABETES MELLITUS, TYPE II    diet controlled   GERD    Hand tingling    HYPERLIPIDEMIA    HYPERTENSION    Neuroendocrine tumor    OSTEOPENIA    PONV (postoperative nausea and vomiting)    " it relaxes my bladder muscles and I have been known to pee all over the place"   Psoriasis    severe, began soriatane 01/2012   Pulmonary embolism ([place])    Rectal fissure    Scoliosis    Wears glasses     Past Surgical History:  Procedure Laterality Date   benign rectal growth  2004   removed by Dr. Zella Richer   BIOPSY  08/09/2018   Procedure: BIOPSY;   Surgeon: Carol Ada, MD;  Location: [place];  Service: Endoscopy;;   BIOPSY  08/22/2018   Procedure: BIOPSY;  Surgeon: Juanita Craver, MD;  Location: [facility] ENDOSCOPY;  Service: Endoscopy;;   BIOPSY  03/07/2019   Procedure: BIOPSY;  Surgeon: Irving Copas., MD;  Location: [place];  Service: Gastroenterology;;   BIOPSY  08/06/2020   Procedure: BIOPSY;  Surgeon: Irving Copas., MD;  Location: [facility] ENDOSCOPY;  Service: Gastroenterology;;   BREAST BIOPSY Right    BREAST EXCISIONAL BIOPSY Left    BREAST SURGERY  1988   biopsy   CESAREAN SECTION     COLONOSCOPY N/A 08/22/2018   Procedure: COLONOSCOPY;  Surgeon: Juanita Craver, MD;  Location: [facility] ENDOSCOPY;  Service: Endoscopy;  Laterality: N/A;   ENDOSCOPIC MUCOSAL RESECTION N/A 03/07/2019   Procedure: ENDOSCOPIC MUCOSAL RESECTION;  Surgeon: Rush Landmark Telford Nab., MD;  Location: [place];  Service: Gastroenterology;  Laterality: N/A;   ESOPHAGOGASTRODUODENOSCOPY (EGD) WITH PROPOFOL N/A 08/04/2018   Procedure: ESOPHAGOGASTRODUODENOSCOPY (EGD) WITH PROPOFOL;  Surgeon: Ladene Artist, MD;  Location: [place];  Service: Gastroenterology;  Laterality: N/A;   ESOPHAGOGASTRODUODENOSCOPY (EGD) WITH PROPOFOL N/A 03/07/2019   Procedure: ESOPHAGOGASTRODUODENOSCOPY (EGD) WITH PROPOFOL;  Surgeon: Rush Landmark Telford Nab., MD;  Location: [place];  Service: Gastroenterology;  Laterality: N/A;   ESOPHAGOGASTRODUODENOSCOPY (EGD)  WITH PROPOFOL N/A 08/06/2020   Procedure: ESOPHAGOGASTRODUODENOSCOPY (EGD) WITH PROPOFOL;  Surgeon: Rush Landmark Telford Nab., MD;  Location: [place];  Service: Gastroenterology;  Laterality: N/A;   EUS N/A 03/07/2019   Procedure: UPPER ENDOSCOPIC ULTRASOUND (EUS) RADIAL;  Surgeon: Irving Copas., MD;  Location: [place];  Service: Gastroenterology;  Laterality: N/A;   EUS N/A 08/06/2020   Procedure: UPPER ENDOSCOPIC ULTRASOUND (EUS) RADIAL;  Surgeon: Irving Copas., MD;  Location: [place];  Service: Gastroenterology;  Laterality: N/A;   FINE NEEDLE ASPIRATION  03/07/2019   Procedure: FINE NEEDLE ASPIRATION (FNA) LINEAR;  Surgeon: Irving Copas., MD;  Location: [place];  Service: Gastroenterology;;   FINE NEEDLE ASPIRATION  08/06/2020   Procedure: FINE NEEDLE ASPIRATION;  Surgeon: Irving Copas., MD;  Location: [place];  Service: Gastroenterology;;   Otho Darner SIGMOIDOSCOPY N/A 08/04/2018   Procedure: Beryle Quant;  Surgeon: Ladene Artist, MD;  Location: [place];  Service: Gastroenterology;  Laterality: N/A;   FLEXIBLE SIGMOIDOSCOPY N/A 08/09/2018   Procedure: FLEXIBLE SIGMOIDOSCOPY;  Surgeon: Carol Ada, MD;  Location: [place];  Service: Endoscopy;  Laterality: N/A;   HEMOSTASIS CLIP PLACEMENT  03/07/2019   Procedure: HEMOSTASIS CLIP PLACEMENT;  Surgeon: Rush Landmark Telford Nab., MD;  Location: [place];  Service: Gastroenterology;;   SUBMUCOSAL LIFTING INJECTION  03/07/2019   Procedure: SUBMUCOSAL LIFTING INJECTION;  Surgeon: Irving Copas., MD;  Location: [place];  Service: Gastroenterology;;   VIDEO BRONCHOSCOPY WITH ENDOBRONCHIAL NAVIGATION N/A 03/14/2018   Procedure: VIDEO BRONCHOSCOPY WITH ENDOBRONCHIAL NAVIGATION;  Surgeon: Collene Gobble, MD;  Location: [facility];  Service: Thoracic;  Laterality: N/A;    Current Medications: No outpatient medications have been marked as taking for the 11/08/21 encounter (Appointment) with Freada Bergeron, MD.     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  Has one child and one grandchild.  Retired Careers adviser.  Education: college.    Social Determinants of Health   Financial Resource Strain: Low Risk  (10/04/2021)   Overall Financial Resource Strain (CARDIA)    Difficulty of Paying Living Expenses: Not hard at all  Food Insecurity: No Food Insecurity (10/04/2021)   Hunger Vital Sign    Worried About Running Out of Food in the Last Year: Never true    Ran Out of Food in the Last Year: Never true  Transportation Needs: No Transportation Needs (10/04/2021)   PRAPARE - Hydrologist (Medical): No    Lack of Transportation (  Non-Medical): No  Physical Activity: Insufficiently Active (10/04/2021)   Exercise Vital Sign    Days of Exercise per Week: 3 days    Minutes of Exercise per Session: 30 min  Stress: No Stress Concern Present (10/04/2021)   [place]    Feeling of Stress : Not at all  Social Connections: Socially Isolated (10/04/2021)   Social Connection and Isolation Panel [NHANES]    Frequency of Communication with Friends and Family: Three times a week    Frequency of Social Gatherings with Friends and Family: Three times a week    Attends Religious Services: Never    Active Member of Clubs or Organizations: No    Attends Archivist Meetings: Never    Marital Status: Widowed     Family History: The patient's family history includes Arthritis in an  other family member; Asthma in her mother and another family member; Breast cancer in an other family member; Breast cancer (age of onset: 84) in her maternal aunt; Heart disease in her mother and another family member; Hypertension in an other family member; Lung cancer in her father; Parkinson's disease in her maternal grandmother; Rheumatic fever in her maternal grandfather.  ROS:   Please see the history of present illness.    Review of Systems  Constitutional:  Negative for chills and fever.  HENT:  Negative for congestion and sore throat.   Eyes:  Negative for blurred vision and double vision.  Respiratory:  Positive for shortness of breath (exertional). Negative for cough.   Cardiovascular:  Positive for chest pain and leg swelling (LLE). Negative for palpitations, orthopnea, claudication and PND.  Gastrointestinal:  Negative for heartburn and nausea.  Genitourinary:  Negative for dysuria and urgency.  Musculoskeletal:  Positive for myalgias. Negative for joint pain.  Skin:  Negative for itching and rash.  Neurological:  Positive for dizziness. Negative for headaches.  Endo/Heme/Allergies:  Does not bruise/bleed easily.  Psychiatric/Behavioral:  The patient is not nervous/anxious and does not have insomnia.    All other systems reviewed and are negative.  EKGs/Labs/Other Studies Reviewed:    The following studies were reviewed today: Myoview 05/2021:   The study is normal. The study is low risk.   No ST deviation was noted.   Left ventricular function is normal. Nuclear stress EF: 85 %. The left ventricular ejection fraction is hyperdynamic (>65%). End diastolic cavity size is normal.   Prior study available for comparison from 01/05/2016.  TTE 05/12/21: IMPRESSIONS     1. Left ventricular ejection fraction, by estimation, is 60 to 65%. The  left ventricle has normal function. The left ventricle has no regional  wall motion abnormalities. There is mild concentric left  ventricular  hypertrophy. Left ventricular diastolic  parameters were normal.   2. Right ventricular systolic function is normal. The right ventricular  size is normal.   3. The mitral valve is normal in structure. No evidence of mitral valve  regurgitation. No evidence of mitral stenosis.   4. The aortic valve is normal in structure. Aortic valve regurgitation is  not visualized. No aortic stenosis is present.   5. The inferior vena cava is normal in size with greater than 50%  respiratory variability, suggesting right atrial pressure of 3 mmHg.   CT Chest 02/12/21 IMPRESSION: 1. 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 2. Aortic atherosclerosis, in addition to left main and three-vessel coronary artery disease. Assessment for potential risk factor modification, dietary therapy or pharmacologic therapy may be warranted, if clinically indicated. 3. There are calcifications of the aortic valve. Echocardiographic correlation for evaluation of potential valvular dysfunction may be warranted if clinically indicated. 4. Large hiatal hernia. 5. Hepatic steatosis.   Aortic Atherosclerosis (ICD10-I70.0).  CT chest: 12/22/2017 1. In addition to the previously noted mass in the left lower lobe there several other smaller irregular-shaped nodules scattered throughout the lungs bilaterally. The irregular shape of the nodules would be unusual for metastatic disease. The possibility of a benign etiology such as multifocal bronchoceles should be considered, but is a diagnosis of exclusion. Malignancy should be excluded, and further evaluation with PET-CT is again suggested. 2. Nonocclusive filling defect in the distal left main pulmonary artery compatible with pulmonary embolism. 3. Hepatic steatosis. 4. Aortic atherosclerosis, in  addition to left main and left anterior descending coronary artery disease. Assessment for potential risk factor modification, dietary therapy or pharmacologic therapy may be warranted, if clinically indicated. 5. There are calcifications of the aortic valve. Echocardiographic correlation for evaluation of potential valvular dysfunction may be warranted if clinically indicated.   Coronary CTA: 12/05/2017   1. Coronary calcium score of 6. This was 42 percentile for age and sex matched control. 2. Normal coronary origin with right dominance. 3. There is mild CAD in the proximal LAD, aggressive risk factor modification is recommended SOM most probably secondary to obesity and deconditioning.   PET CT: 01/01/2018   1. For the most part the branching nodularity in the lungs is of low activity and likely benign. Given the somewhat branching tubular appearance, the possibility of multifocal bronchocele is certainly raised. The lesion along the left upper lobe paramediastinal area, and in the right upper lobe just adjacent to the fissure do have some low-grade activity (1.6 and 1.7, respectively) which may well be inflammatory but for which surveillance imaging in 6 months time would be suggested. 2. The larger lesion in the left lower lobe is not hypermetabolic and is likely benign. Bilateral tonsillar activity in the neck is likely benign. 3. No worrisome findings in the neck, abdomen/pelvis, or skeleton.    EKG:  EKG was not ordered today  Recent Labs: 11/11/2020: TSH 2.63 10/05/2021: ALT 20; BUN 17; Creatinine, Ser 1.01; Hemoglobin 12.8; Platelets 290.0; Potassium 4.5; Sodium 137  Recent Lipid Panel    Component Value Date/Time   CHOL 205 (H) 10/05/2021 0930   CHOL 218 (H) 06/16/2020 0827   TRIG 239.0 (H) 10/05/2021 0930   HDL 48.20 10/05/2021 0930   HDL 58 06/16/2020 0827   CHOLHDL 4 10/05/2021 0930   VLDL 47.8 (H) 10/05/2021 0930   LDLCALC 109 (H) 03/23/2021 1002   LDLCALC 129 (H)  06/16/2020 0827   LDLCALC 176 (H) 11/05/2019 0844   LDLDIRECT 113.0 10/05/2021 0930     Risk Assessment/Calculations:           Physical Exam:    VS:  There were no vitals taken for this visit.    Wt Readings from Last 3 Encounters:  10/05/21 146 lb (66.2 kg)  06/29/21 147 lb 9 oz (66.9 kg)  05/19/21 146 lb (66.2 kg)     GEN: Well nourished, well developed in no acute distress HEENT: Normal NECK: No JVD; No carotid bruits CARDIAC: RRR, 1/6 systolic murmur, no rubs or gallops RESPIRATORY:  Clear to auscultation without rales, wheezing or rhonchi  ABDOMEN: Soft, non-tender, non-distended MUSCULOSKELETAL:  trace L-leg edema; No deformity  SKIN: Warm and dry NEUROLOGIC:  Alert and oriented x 3 PSYCHIATRIC:  Normal affect   ASSESSMENT:    No diagnosis found.  PLAN:    In order of problems listed above:   #Dyspnea on Exertion #Chest Pain: #CAD: Cardiac CT 2019 with mild prox LAD disease. Most recent CT scan of the abdomen shows more extensive, triple vessel calcification. Myoview 05/2021 with no ischemia. TTE with LVEF 60-65%, no significant valve disease.  -Continue crestor 5mg  2x/week (unable to tolerate higher doses) -Continue vascepa 1g BID (patient decreased dose due to concern for bleeding) -Continue lisinopril 10mg  daily -Continue bystolic 2.5mg  daily  #HLD: #Aortic Atherosclerosis: #Statin intolerance: Has failed multiple statins in the past. Now on crestor 5mg  twice per week and vascepa at reduced dose.  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 Has elevated Bps in MD office. -Continue lisinopril 10mg  daily -Continue bystolic 2.5mg  daily       Medication Adjustments/Labs and Tests Ordered: Current medicines are reviewed at length with the patient today.  Concerns regarding medicines are outlined above.  No orders  of the defined types were placed in this encounter.  No orders of the defined types were placed in this encounter.   There are no Patient Instructions on file for this visit.     Signed, Freada Bergeron, MD  10/28/2021 8:06 PM    [place]

## 2021-11-08 ENCOUNTER — Ambulatory Visit: Payer: Medicare Other | Attending: Cardiology | Admitting: Cardiology

## 2021-11-08 VITALS — BP 132/84 | HR 69 | Ht 60.0 in | Wt 147.8 lb

## 2021-11-08 DIAGNOSIS — I10 Essential (primary) hypertension: Secondary | ICD-10-CM

## 2021-11-08 DIAGNOSIS — Z86711 Personal history of pulmonary embolism: Secondary | ICD-10-CM | POA: Diagnosis not present

## 2021-11-08 DIAGNOSIS — E785 Hyperlipidemia, unspecified: Secondary | ICD-10-CM | POA: Diagnosis not present

## 2021-11-08 DIAGNOSIS — I2510 Atherosclerotic heart disease of native coronary artery without angina pectoris: Secondary | ICD-10-CM | POA: Diagnosis not present

## 2021-11-08 DIAGNOSIS — R0602 Shortness of breath: Secondary | ICD-10-CM

## 2021-11-08 DIAGNOSIS — R600 Localized edema: Secondary | ICD-10-CM

## 2021-11-08 DIAGNOSIS — I700 Atherosclerosis of aorta: Secondary | ICD-10-CM

## 2021-11-08 NOTE — Patient Instructions (Signed)
Medication Instructions:   Your physician recommends that you continue on your current medications as directed. Please refer to the Current Medication list given to you today.  *If you need a refill on your cardiac medications before your next appointment, please call your pharmacy*   Follow-Up: At [facility], you and your health needs are our priority.  As part of our continuing mission to provide you with exceptional heart care, we have created designated Provider Care Teams.  These Care Teams include your primary Cardiologist (physician) and Advanced Practice Providers (APPs -  Physician Assistants and Nurse Practitioners) who all work together to provide you with the care you need, when you need it.  We recommend signing up for the patient portal called "MyChart".  Sign up information is provided on this After Visit Summary.  MyChart is used to connect with patients for Virtual Visits (Telemedicine).  Patients are able to view lab/test results, encounter notes, upcoming appointments, etc.  Non-urgent messages can be sent to your provider as well.   To learn more about what you can do with MyChart, go to [email or web address].    Your next appointment:   6 month(s)  The format for your next appointment:   In Person  Provider:   Freada Bergeron, MD     Important Information About Sugar

## 2021-11-08 NOTE — Progress Notes (Signed)
Cardiology Office Note:    Date:  11/08/2021   ID:  [name], DOB 1945-08-28, MRN [id number]  PCP:  [name], MD   [facility] Providers Cardiologist:  Freada Bergeron, MD {   Referring MD: [name], MD    History of Present Illness:    [name] is a 77 y.o. female with a hx of difficult to control hypertension, hyperlipidemia, mild nonobstructive CAD, PE, aortic atherosclerosis who was previously followed by Dr. [name] who now returns to clinic for follow-up.  Per review of the record, Cardiac CTA 11/2017 with mild nonobstructive CAD. She has had difficulty with lipid control. Intolerant to pravastatin, atorvastatin, pitavastatin. She reports Repatha caused "paralysis". She has concerns about fish oil and bleeding. She was started on crestor 5mg  twice per week.  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 Was also complaining of dyspnea on exertion. We obtained a myoview 05/2021 which was negative. TTE showed LVEF 60-65%, mild LVH, normal RV, no significant valve disease.   Today, the patient states that her blood pressure has been an issue lately. It was running as low as 90, but never above 601 systolic. Her PCP cut her lisinopril in half to 5 mg. While her blood pressure is this low, she feels worse. She could "hardly put one foot in front of the other." Typically she feels better with blood pressures of 093-235 systolic. Of note, she endorses "horrendous white coat hypertension."  Recently, she also began to have edema in her bilateral feet. She started wearing her support stockings again. About 4 days ago she returned to 10 mg Lisinopril. So far there has not been much difference in how she feels on 10 mg or 5 mg of lisinopril.  Of note, she was also started on the Vtama cream for psoriasis on the soles of her feet. She tried using Vtama on one foot, and the old prednisone on the other  foot. There was no significant change in her swelling. She has stopped using the Vtama. Since her medication changes have been happening at the same time it is difficult for her to determine the culprit medication.  Regarding her diet, she does not season her meals, nor order out. She typically monitors her sodium intake and her diet due to ongoing pancreatic issues. Every once in a while she may have low-sodium potato chips; she denies any correlation between eating chips and her recent swelling.  Additionally she complains of some costal pain, usually not occurring until much later in the day. She believes this pain may be related to her pancreatic issues or neuroendocrine tumor. She is followed by [facility]: at this point surgery is not recommended.  She denies any palpitations, shortness of breath. No lightheadedness, headaches, syncope, orthopnea, or PND.   Past Medical History:  Diagnosis Date   ALLERGIC RHINITIS    ANEMIA-NOS    Arthritis    ASTHMA    Breast discharge    Carpal tunnel syndrome    COLONIC POLYPS, HX OF    Coronary artery disease    "mild CAD" noted on 12/05/17 in coronary CT scan   DIABETES MELLITUS, TYPE II    diet controlled   GERD    Hand tingling    HYPERLIPIDEMIA    HYPERTENSION    Neuroendocrine tumor    OSTEOPENIA    PONV (postoperative nausea and vomiting)    " it relaxes my bladder  muscles and I have been known to pee all over the place"   Psoriasis    severe, began soriatane 01/2012   Pulmonary embolism ([place])    Rectal fissure    Scoliosis    Wears glasses     Past Surgical History:  Procedure Laterality Date   benign rectal growth  2004   removed by Dr. Zella Richer   BIOPSY  08/09/2018   Procedure: BIOPSY;  Surgeon: Carol Ada, MD;  Location: [place];  Service: Endoscopy;;   BIOPSY  08/22/2018   Procedure: BIOPSY;  Surgeon: Juanita Craver, MD;  Location: [place];  Service: Endoscopy;;   BIOPSY  03/07/2019   Procedure: BIOPSY;  Surgeon:  Irving Copas., MD;  Location: [place];  Service: Gastroenterology;;   BIOPSY  08/06/2020   Procedure: BIOPSY;  Surgeon: Irving Copas., MD;  Location: [place];  Service: Gastroenterology;;   BREAST BIOPSY Right    BREAST EXCISIONAL BIOPSY Left    BREAST SURGERY  1988   biopsy   CESAREAN SECTION     COLONOSCOPY N/A 08/22/2018   Procedure: COLONOSCOPY;  Surgeon: Juanita Craver, MD;  Location: [facility] ENDOSCOPY;  Service: Endoscopy;  Laterality: N/A;   ENDOSCOPIC MUCOSAL RESECTION N/A 03/07/2019   Procedure: ENDOSCOPIC MUCOSAL RESECTION;  Surgeon: Rush Landmark Telford Nab., MD;  Location: [place];  Service: Gastroenterology;  Laterality: N/A;   ESOPHAGOGASTRODUODENOSCOPY (EGD) WITH PROPOFOL N/A 08/04/2018   Procedure: ESOPHAGOGASTRODUODENOSCOPY (EGD) WITH PROPOFOL;  Surgeon: Ladene Artist, MD;  Location: [place];  Service: Gastroenterology;  Laterality: N/A;   ESOPHAGOGASTRODUODENOSCOPY (EGD) WITH PROPOFOL N/A 03/07/2019   Procedure: ESOPHAGOGASTRODUODENOSCOPY (EGD) WITH PROPOFOL;  Surgeon: Rush Landmark Telford Nab., MD;  Location: [place];  Service: Gastroenterology;  Laterality: N/A;   ESOPHAGOGASTRODUODENOSCOPY (EGD) WITH PROPOFOL N/A 08/06/2020   Procedure: ESOPHAGOGASTRODUODENOSCOPY (EGD) WITH PROPOFOL;  Surgeon: Rush Landmark Telford Nab., MD;  Location: [place];  Service: Gastroenterology;  Laterality: N/A;   EUS N/A 03/07/2019   Procedure: UPPER ENDOSCOPIC ULTRASOUND (EUS) RADIAL;  Surgeon: Irving Copas., MD;  Location: [place];  Service: Gastroenterology;  Laterality: N/A;   EUS N/A 08/06/2020   Procedure: UPPER ENDOSCOPIC ULTRASOUND (EUS) RADIAL;  Surgeon: Irving Copas., MD;  Location: [place];  Service: Gastroenterology;  Laterality: N/A;   FINE NEEDLE ASPIRATION  03/07/2019   Procedure: FINE NEEDLE ASPIRATION (FNA) LINEAR;  Surgeon: Irving Copas., MD;  Location: [place];  Service: Gastroenterology;;   FINE  NEEDLE ASPIRATION  08/06/2020   Procedure: FINE NEEDLE ASPIRATION;  Surgeon: Irving Copas., MD;  Location: [place];  Service: Gastroenterology;;   Otho Darner SIGMOIDOSCOPY N/A 08/04/2018   Procedure: Beryle Quant;  Surgeon: Ladene Artist, MD;  Location: [place];  Service: Gastroenterology;  Laterality: N/A;   FLEXIBLE SIGMOIDOSCOPY N/A 08/09/2018   Procedure: FLEXIBLE SIGMOIDOSCOPY;  Surgeon: Carol Ada, MD;  Location: [place];  Service: Endoscopy;  Laterality: N/A;   HEMOSTASIS CLIP PLACEMENT  03/07/2019   Procedure: HEMOSTASIS CLIP PLACEMENT;  Surgeon: Irving Copas., MD;  Location: [place];  Service: Gastroenterology;;   SUBMUCOSAL LIFTING INJECTION  03/07/2019   Procedure: SUBMUCOSAL LIFTING INJECTION;  Surgeon: Irving Copas., MD;  Location: [place];  Service: Gastroenterology;;   VIDEO BRONCHOSCOPY WITH ENDOBRONCHIAL NAVIGATION N/A 03/14/2018   Procedure: VIDEO BRONCHOSCOPY WITH ENDOBRONCHIAL NAVIGATION;  Surgeon: Collene Gobble, MD;  Location: [facility];  Service: Thoracic;  Laterality: N/A;    Current Medications: Current Meds  Medication Sig   apixaban (ELIQUIS) 5 MG TABS tablet Take 1 tablet (5 mg  total) by mouth 2 (two) times daily.   Ascorbic Acid (VITAMIN C GUMMIE PO) Take 282 mg by mouth every evening. Each tablet 141 mg   calcium carbonate (TUMS EX) 750 MG chewable tablet Chew 750 mg by mouth daily.   Cholecalciferol (VITAMIN D3 GUMMIES ADULT PO) Take 2,000 Units by mouth every evening.   clindamycin (CLEOCIN T) 1 % external solution Apply 1 application topically daily as needed (wounds).    clobetasol (TEMOVATE) 0.05 % external solution Apply topically daily.   Coenzyme Q10 (COQ10 GUMMIES ADULT PO) Take 200 mg by mouth in the morning and at bedtime.   CREON 36000-114000 units CPEP capsule Take 36,000 Units by mouth 2 (two) times daily with a meal.   diphenhydrAMINE (BENADRYL) 25 MG tablet Take 25 mg by mouth at  bedtime.   FINACEA 15 % gel Apply topically 2 (two) times daily.   folic acid (FOLVITE) 1 MG tablet TAKE 1 TABLET BY MOUTH EVERY DAY   halobetasol (ULTRAVATE) 0.05 % cream APPLY TO AFFECTED AREA TWICE A DAY   icosapent Ethyl (VASCEPA) 1 g capsule Take 1 capsule (1 g total) by mouth 2 (two) times daily.   lisinopril (ZESTRIL) 10 MG tablet Take 0.5 tablets (5 mg total) by mouth daily. (Patient taking differently: Take 10 mg by mouth daily.)   Multiple Vitamins-Minerals (ADULT GUMMY PO) Take 1 tablet by mouth every evening. Children [employer] gummie formula   nebivolol (BYSTOLIC) 2.5 MG tablet TAKE 1 TABLET BY MOUTH EVERY DAY *BRAND BYSTOLIC*   omeprazole (PRILOSEC OTC) 20 MG tablet Take 20 mg by mouth daily.   ondansetron (ZOFRAN-ODT) 8 MG disintegrating tablet Take 1 tablet (8 mg total) by mouth every 8 (eight) hours as needed for nausea.   ONETOUCH ULTRA test strip USE TEST STRIPS TO CHECK BLOOD SUGAR DAILY AND AS NEEDED. E11.9   oxymetazoline (AFRIN) 0.05 % nasal spray Place 1 spray into both nostrils 2 (two) times daily.   oxymetazoline (NASAL RELIEF) 0.05 % nasal spray Place into the nose.   polyethylene glycol (MIRALAX / GLYCOLAX) 17 g packet Take 17 g by mouth every other day.   rosuvastatin (CRESTOR) 5 MG tablet Take 1 tablet (5 mg total) by mouth 2 (two) times a week. Sometimes 3 times a week   Simethicone (GAS-X PO) Take 250 mg by mouth daily as needed.   vitamin B-12 (CYANOCOBALAMIN) 1000 MCG tablet Take 1,000 mcg by mouth daily.     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  Has one  child and one grandchild.  Retired Careers adviser.  Education: college.    Social Determinants of Health   Financial Resource Strain: Low Risk  (10/04/2021)   Overall Financial Resource Strain (CARDIA)    Difficulty of Paying Living Expenses: Not hard at all  Food Insecurity: No Food Insecurity (10/04/2021)   Hunger Vital Sign    Worried About Running Out of Food in the Last Year: Never true    Ran Out of Food in the Last Year: Never true  Transportation Needs: No Transportation Needs (10/04/2021)   PRAPARE - Hydrologist (Medical): No    Lack of Transportation (Non-Medical): No  Physical Activity: Insufficiently Active (10/04/2021)   Exercise Vital Sign    Days of Exercise per Week: 3 days    Minutes of Exercise per Session: 30 min  Stress: No Stress Concern Present (10/04/2021)   [place]    Feeling of Stress : Not at all  Social Connections: Socially Isolated (10/04/2021)   Social Connection and Isolation Panel [NHANES]    Frequency of Communication with Friends and Family: Three times a week    Frequency of Social Gatherings with Friends and Family: Three times a week    Attends Religious Services: Never    Active Member of Clubs or Organizations: No    Attends Archivist Meetings: Never    Marital Status: Widowed     Family History: The patient's family history includes Arthritis in an  other family member; Asthma in her mother and another family member; Breast cancer in an other family member; Breast cancer (age of onset: 30) in her maternal aunt; Heart disease in her mother and another family member; Hypertension in an other family member; Lung cancer in her father; Parkinson's disease in her maternal grandmother; Rheumatic fever in her maternal grandfather.  ROS:   Please see the history of present illness.    Review of Systems  Constitutional:  Negative for chills and fever.  HENT:  Negative for congestion and sore throat.   Eyes:  Negative for blurred vision and double vision.  Respiratory:  Negative for cough.   Cardiovascular:  Positive for chest pain (Costal pain) and leg swelling (Bilateral feet). Negative for palpitations, orthopnea, claudication and PND.  Gastrointestinal:  Negative for heartburn and nausea.  Genitourinary:  Negative for dysuria and urgency.  Musculoskeletal:  Positive for myalgias.  Skin:  Negative for itching and rash.  Neurological:  Negative for headaches.  Endo/Heme/Allergies:  Does not bruise/bleed easily.  Psychiatric/Behavioral:  The patient is not nervous/anxious and does not have insomnia.    All other systems reviewed and are negative.  EKGs/Labs/Other Studies Reviewed:    The following studies were reviewed today:  Myoview 05/2021:   The study is normal. The study is low risk.   No ST deviation was noted.   Left ventricular function is normal. Nuclear stress EF: 85 %. The left ventricular ejection fraction is hyperdynamic (>65%). End diastolic cavity size is normal.   Prior study available for comparison from 01/05/2016.  TTE 05/12/21: IMPRESSIONS   1. Left ventricular ejection fraction, by estimation, is 60 to 65%. The  left ventricle has normal function. The left ventricle has no regional  wall motion abnormalities. There is mild concentric left ventricular  hypertrophy. Left ventricular diastolic  parameters were normal.    2. Right ventricular systolic function is normal. The right ventricular  size is  normal.   3. The mitral valve is normal in structure. No evidence of mitral valve  regurgitation. No evidence of mitral stenosis.   4. The aortic valve is normal in structure. Aortic valve regurgitation is  not visualized. No aortic stenosis is present.   5. The inferior vena cava is normal in size with greater than 50%  respiratory variability, suggesting right atrial pressure of 3 mmHg.   CT Chest 02/12/21 IMPRESSION: 1. 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 2. Aortic atherosclerosis, in addition to left main and three-vessel coronary artery disease. Assessment for potential risk factor modification, dietary therapy or pharmacologic therapy may be warranted, if clinically indicated. 3. There are calcifications of the aortic valve. Echocardiographic correlation for evaluation of potential valvular dysfunction may be warranted if clinically indicated. 4. Large hiatal hernia. 5. Hepatic steatosis.   Aortic Atherosclerosis (ICD10-I70.0).  CT chest: 12/22/2017 1. In addition to the previously noted mass in the left lower lobe there several other smaller irregular-shaped nodules scattered throughout the lungs bilaterally. The irregular shape of the nodules would be unusual for metastatic disease. The possibility of a benign etiology such as multifocal bronchoceles should be considered, but is a diagnosis of exclusion. Malignancy should be excluded, and further evaluation with PET-CT is again suggested. 2. Nonocclusive filling defect in the distal left main pulmonary artery compatible with pulmonary embolism. 3. Hepatic steatosis. 4. Aortic atherosclerosis, in addition to left main and left anterior descending coronary artery disease.  Assessment for potential risk factor modification, dietary therapy or pharmacologic therapy may be warranted, if clinically indicated. 5. There are calcifications of the aortic valve. Echocardiographic correlation for evaluation of potential valvular dysfunction may be warranted if clinically indicated.   Coronary CTA: 12/05/2017   1. Coronary calcium score of 6. This was 40 percentile for age and sex matched control. 2. Normal coronary origin with right dominance. 3. There is mild CAD in the proximal LAD, aggressive risk factor modification is recommended SOM most probably secondary to obesity and deconditioning.   PET CT: 01/01/2018   1. For the most part the branching nodularity in the lungs is of low activity and likely benign. Given the somewhat branching tubular appearance, the possibility of multifocal bronchocele is certainly raised. The lesion along the left upper lobe paramediastinal area, and in the right upper lobe just adjacent to the fissure do have some low-grade activity (1.6 and 1.7, respectively) which may well be inflammatory but for which surveillance imaging in 6 months time would be suggested. 2. The larger lesion in the left lower lobe is not hypermetabolic and is likely benign. Bilateral tonsillar activity in the neck is likely benign. 3. No worrisome findings in the neck, abdomen/pelvis, or skeleton.    EKG:  EKG is personally reviewed. 11/08/2021:  Sinus rhythm. Rate 69 bpm. Poor R wave progression.   Recent Labs: 11/11/2020: TSH 2.63 10/05/2021: ALT 20; BUN 17; Creatinine, Ser 1.01; Hemoglobin 12.8; Platelets 290.0; Potassium 4.5; Sodium 137   Recent Lipid Panel    Component Value Date/Time   CHOL 205 (H) 10/05/2021 0930   CHOL 218 (H) 06/16/2020 0827   TRIG 239.0 (H) 10/05/2021 0930   HDL 48.20 10/05/2021 0930   HDL 58 06/16/2020 0827   CHOLHDL 4 10/05/2021 0930   VLDL 47.8 (H) 10/05/2021 0930   LDLCALC 109 (H) 03/23/2021 1002   LDLCALC 129 (H)  06/16/2020 0827   LDLCALC 176 (H) 11/05/2019 0844   LDLDIRECT 113.0 10/05/2021 0930     Risk Assessment/Calculations:           Physical Exam:    VS:  BP 132/84   Pulse 69   Ht 5' (1.524 m)   Wt 147 lb 12.8 oz (67 kg)   BMI 28.87 kg/m     Wt Readings from Last 3 Encounters:  11/08/21 147 lb 12.8 oz (67 kg)  10/05/21 146 lb (66.2 kg)  06/29/21 147 lb 9 oz (66.9 kg)     GEN: Well nourished, well developed in no acute distress HEENT: Normal NECK: No JVD; No carotid bruits CARDIAC: RRR, 1/6 systolic murmur, no rubs or gallops RESPIRATORY:  Clear to auscultation without rales, wheezing or rhonchi  ABDOMEN: Soft, non-tender, non-distended MUSCULOSKELETAL: Trace LE edema to mid-shin, No deformity.  SKIN: Warm and dry NEUROLOGIC:  Alert and oriented x 3 PSYCHIATRIC:  Normal affect   ASSESSMENT:    1. Coronary artery disease involving native coronary artery of native heart without angina pectoris   2. History of pulmonary embolus (PE)   3. Essential hypertension   4. Hyperlipidemia LDL goal <70   5. Aortic atherosclerosis ([place])   6. Lower extremity edema   7. SOB (shortness of breath)     PLAN:    In order of problems listed above:   #Dyspnea on Exertion #Chest Pain: #CAD: Cardiac CT 2019 with mild prox LAD disease. Most recent CT scan of the abdomen shows more extensive, triple vessel calcification. Myoview 05/2021 with no ischemia. TTE with LVEF 60-65%, no significant valve disease. Currently doing well without anginal symptoms.  -Continue crestor 5mg  2x/week (unable to tolerate higher doses) -Continue vascepa 1g BID (patient decreased dose due to concern for bleeding) -Continue lisinopril 10mg  daily for now; discussed if her SBP<110 at home to go back to 5mg  daily -Continue bystolic 2.5mg  daily  #HLD: #Aortic Atherosclerosis: #Statin intolerance: Has failed multiple statins in the past. Now on crestor 5mg  twice per week and vascepa at reduced dose.   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 Doubt her swelling is related to lisinopril dosing but may be related to psoriasis cream (?). Discussed the importance of repeating BP at home and if SBP<110, to go back down to 5mg  daily on her lisinopril. BP is currently 130s in the office after taking 10mg  today. -Continue lisinopril 10mg  daily for now; discussed if her SBP<110 at home to go back to 5mg  daily -Continue bystolic 2.5mg  daily  #LE Edema: Improved with compression socks. I do not think this is related to decreasing her lisinopril as she is diligent about her sodium intake. May be related to new psoriasis cream (?). Given that symptoms are improving, will continue to monitor at this time and use compression sock therapy.        Follow-up:  6 months.  Medication Adjustments/Labs and Tests Ordered: Current medicines are reviewed at length with the patient today.  Concerns regarding medicines are outlined above.   Orders Placed This Encounter  Procedures   EKG 12-Lead   No orders of the defined types were placed in this encounter.  Patient Instructions  Medication Instructions:   Your physician recommends that  you continue on your current medications as directed. Please refer to the Current Medication list given to you today.  *If you need a refill on your cardiac medications before your next appointment, please call your pharmacy*   Follow-Up: At [facility], you and your health needs are our priority.  As part of our continuing mission to provide you with exceptional heart care, we have created designated Provider Care Teams.  These Care Teams include your primary Cardiologist (physician) and Advanced Practice  Providers (APPs -  Physician Assistants and Nurse Practitioners) who all work together to provide you with the care you need, when you need it.  We recommend signing up for the patient portal called "MyChart".  Sign up information is provided on this After Visit Summary.  MyChart is used to connect with patients for Virtual Visits (Telemedicine).  Patients are able to view lab/test results, encounter notes, upcoming appointments, etc.  Non-urgent messages can be sent to your provider as well.   To learn more about what you can do with MyChart, go to [email or web address].    Your next appointment:   6 month(s)  The format for your next appointment:   In Person  Provider:   Freada Bergeron, MD     Important Information About Sugar        I,Mathew Stumpf,acting as a scribe for Freada Bergeron, MD.,have documented all relevant documentation on the behalf of Freada Bergeron, MD,as directed by  Freada Bergeron, MD while in the presence of Freada Bergeron, MD.  I, Freada Bergeron, MD, have reviewed all documentation for this visit. The documentation on 11/08/21 for the exam, diagnosis, procedures, and orders are all accurate and complete.   Signed, Freada Bergeron, MD  11/08/2021 10:23 AM    [place]

## 2021-11-12 ENCOUNTER — Ambulatory Visit (INDEPENDENT_AMBULATORY_CARE_PROVIDER_SITE_OTHER): Payer: Medicare Other

## 2021-11-12 DIAGNOSIS — Z23 Encounter for immunization: Secondary | ICD-10-CM | POA: Diagnosis not present

## 2021-12-09 ENCOUNTER — Telehealth: Payer: Medicare Other

## 2021-12-21 ENCOUNTER — Other Ambulatory Visit: Payer: Self-pay

## 2021-12-21 ENCOUNTER — Inpatient Hospital Stay: Payer: Medicare Other | Attending: Internal Medicine

## 2021-12-21 DIAGNOSIS — K319 Disease of stomach and duodenum, unspecified: Secondary | ICD-10-CM | POA: Diagnosis not present

## 2021-12-21 DIAGNOSIS — C7A8 Other malignant neuroendocrine tumors: Secondary | ICD-10-CM

## 2021-12-21 DIAGNOSIS — R918 Other nonspecific abnormal finding of lung field: Secondary | ICD-10-CM | POA: Insufficient documentation

## 2021-12-21 DIAGNOSIS — I2699 Other pulmonary embolism without acute cor pulmonale: Secondary | ICD-10-CM | POA: Diagnosis not present

## 2021-12-21 LAB — CMP (CANCER CENTER ONLY)
ALT: 21 U/L (ref 0–44)
AST: 18 U/L (ref 15–41)
Albumin: 4.1 g/dL (ref 3.5–5.0)
Alkaline Phosphatase: 66 U/L (ref 38–126)
Anion gap: 7 (ref 5–15)
BUN: 13 mg/dL (ref 8–23)
CO2: 29 mmol/L (ref 22–32)
Calcium: 9.6 mg/dL (ref 8.9–10.3)
Chloride: 103 mmol/L (ref 98–111)
Creatinine: 1.04 mg/dL — ABNORMAL HIGH (ref 0.44–1.00)
GFR, Estimated: 55 mL/min — ABNORMAL LOW (ref >60)
Glucose, Bld: 111 mg/dL — ABNORMAL HIGH (ref 70–99)
Potassium: 4.3 mmol/L (ref 3.5–5.1)
Sodium: 139 mmol/L (ref 135–145)
Total Bilirubin: 0.6 mg/dL (ref 0.3–1.2)
Total Protein: 7.2 g/dL (ref 6.5–8.1)

## 2021-12-21 LAB — CBC WITH DIFFERENTIAL (CANCER CENTER ONLY)
Abs Immature Granulocytes: 0.01 10*3/uL (ref 0.00–0.07)
Basophils Absolute: 0.1 10*3/uL (ref 0.0–0.1)
Basophils Relative: 1 %
Eosinophils Absolute: 0.3 10*3/uL (ref 0.0–0.5)
Eosinophils Relative: 5 %
HCT: 38.3 % (ref 36.0–46.0)
Hemoglobin: 12.8 g/dL (ref 12.0–15.0)
Immature Granulocytes: 0 %
Lymphocytes Relative: 23 %
Lymphs Abs: 1.6 10*3/uL (ref 0.7–4.0)
MCH: 31 pg (ref 26.0–34.0)
MCHC: 33.4 g/dL (ref 30.0–36.0)
MCV: 92.7 fL (ref 80.0–100.0)
Monocytes Absolute: 0.5 10*3/uL (ref 0.1–1.0)
Monocytes Relative: 7 %
Neutro Abs: 4.3 10*3/uL (ref 1.7–7.7)
Neutrophils Relative %: 64 %
Platelet Count: 300 10*3/uL (ref 150–400)
RBC: 4.13 MIL/uL (ref 3.87–5.11)
RDW: 14.6 % (ref 11.5–15.5)
WBC Count: 6.7 10*3/uL (ref 4.0–10.5)
nRBC: 0 % (ref 0.0–0.2)

## 2021-12-22 LAB — CHROMOGRANIN A: Chromogranin A (ng/mL): 2256 ng/mL — ABNORMAL HIGH (ref 0.0–101.8)

## 2021-12-24 ENCOUNTER — Other Ambulatory Visit (HOSPITAL_BASED_OUTPATIENT_CLINIC_OR_DEPARTMENT_OTHER): Payer: Self-pay

## 2021-12-24 MED ORDER — COMIRNATY 30 MCG/0.3ML IM SUSY
PREFILLED_SYRINGE | INTRAMUSCULAR | 0 refills | Status: DC
  Filled 2021-12-24: qty 0.3, 1d supply, fill #0

## 2021-12-27 DIAGNOSIS — K319 Disease of stomach and duodenum, unspecified: Secondary | ICD-10-CM | POA: Diagnosis not present

## 2021-12-28 ENCOUNTER — Other Ambulatory Visit: Payer: Self-pay

## 2021-12-28 ENCOUNTER — Inpatient Hospital Stay (HOSPITAL_BASED_OUTPATIENT_CLINIC_OR_DEPARTMENT_OTHER): Payer: Medicare Other | Admitting: Internal Medicine

## 2021-12-28 VITALS — BP 126/60 | HR 55 | Temp 98.7°F | Resp 16 | Wt 147.0 lb

## 2021-12-28 DIAGNOSIS — C7A010 Malignant carcinoid tumor of the duodenum: Secondary | ICD-10-CM

## 2021-12-28 DIAGNOSIS — K319 Disease of stomach and duodenum, unspecified: Secondary | ICD-10-CM | POA: Diagnosis not present

## 2021-12-28 NOTE — Progress Notes (Signed)
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  PRIOR THERAPY: None  CURRENT THERAPY: Xarelto 15 mg p.o. twice daily for 3 weeks followed by 20 mg p.o. daily.  INTERVAL HISTORY: [name] 76 y.o. female returns to the clinic today for follow-up visit.  The patient is feeling fine today with no concerning complaints except for intermittent abdominal pain.  She has been on treatment with Prilosec and it is improving her symptoms.  She was seen by Dr. Leslie Andrea at [facility] as well as Dr. [name] a surgeon at [facility].  They recommended for her to continue on observation.  There is no confirmation on biopsy of the carcinoid tumor of the duodenum.  The patient denied having any current chest pain, shortness of breath, cough or hemoptysis.  She has no nausea, vomiting but has intermittent abdominal pain with no diarrhea or constipation.  She has no hot flashes.  She is here today for evaluation and repeat blood work.  MEDICAL HISTORY: Past Medical History:  Diagnosis Date   ALLERGIC RHINITIS    ANEMIA-NOS    Arthritis    ASTHMA    Breast discharge    Carpal tunnel syndrome    COLONIC POLYPS, HX OF    Coronary artery disease    "mild CAD" noted on 12/05/17 in coronary CT scan   DIABETES MELLITUS, TYPE II    diet controlled   GERD    Hand tingling    HYPERLIPIDEMIA    HYPERTENSION    Neuroendocrine tumor    OSTEOPENIA    PONV (postoperative nausea and vomiting)    " it relaxes my bladder muscles and I have been known to pee all over the place"   Psoriasis    severe, began soriatane 01/2012   Pulmonary embolism (HCC)    Rectal fissure    Scoliosis     Wears glasses     ALLERGIES:  is allergic to phenergan [promethazine hcl], repatha [evolocumab], amlodipine, clarithromycin, codeine, erythromycin, hydralazine hcl, statins, and tetracycline.  MEDICATIONS:  Current Outpatient Medications  Medication Sig Dispense Refill   apixaban (ELIQUIS) 5 MG TABS tablet Take 1 tablet (5 mg total) by mouth 2 (two) times daily. 180 tablet 2   Ascorbic Acid (VITAMIN C GUMMIE PO) Take 282 mg by mouth every evening. Each tablet 141 mg     calcium carbonate (TUMS EX) 750 MG chewable tablet Chew 750 mg by mouth daily.     Cholecalciferol (VITAMIN D3 GUMMIES ADULT PO) Take 2,000 Units by mouth every evening.     clindamycin (CLEOCIN T) 1 % external solution Apply 1 application topically daily as needed (wounds).      clobetasol (TEMOVATE) 0.05 % external solution Apply topically daily.     Coenzyme Q10 (COQ10 GUMMIES ADULT PO) Take 200 mg by mouth in the morning and at bedtime.     COVID-19 mRNA vaccine 2023-2024 (COMIRNATY) syringe Inject into the muscle. 0.3 mL 0   CREON 36000-114000 units CPEP capsule Take 36,000 Units by mouth 2 (two) times daily with a meal.     diphenhydrAMINE (BENADRYL) 25 MG tablet Take 25 mg by mouth at bedtime.     FINACEA 15 % gel  Apply topically 2 (two) times daily.     folic acid (FOLVITE) 1 MG tablet TAKE 1 TABLET BY MOUTH EVERY DAY 30 tablet 5   halobetasol (ULTRAVATE) 0.05 % cream APPLY TO AFFECTED AREA TWICE A DAY     icosapent Ethyl (VASCEPA) 1 g capsule Take 1 capsule (1 g total) by mouth 2 (two) times daily. 360 capsule 3   lisinopril (ZESTRIL) 10 MG tablet Take 0.5 tablets (5 mg total) by mouth daily. (Patient taking differently: Take 10 mg by mouth daily.) 30 tablet 5   Multiple Vitamins-Minerals (ADULT GUMMY PO) Take 1 tablet by mouth every evening. Children [employer] gummie formula     nebivolol (BYSTOLIC) 2.5 MG tablet TAKE 1 TABLET BY MOUTH EVERY DAY *BRAND BYSTOLIC* (Patient not taking: Reported on 12/28/2021) 30  tablet 5   omeprazole (PRILOSEC OTC) 20 MG tablet Take 20 mg by mouth daily.     ondansetron (ZOFRAN-ODT) 8 MG disintegrating tablet Take 1 tablet (8 mg total) by mouth every 8 (eight) hours as needed for nausea. 60 tablet 2   ONETOUCH ULTRA test strip USE TEST STRIPS TO CHECK BLOOD SUGAR DAILY AND AS NEEDED. E11.9 300 strip 1   oxymetazoline (AFRIN) 0.05 % nasal spray Place 1 spray into both nostrils 2 (two) times daily.     oxymetazoline (NASAL RELIEF) 0.05 % nasal spray Place into the nose.     polyethylene glycol (MIRALAX / GLYCOLAX) 17 g packet Take 17 g by mouth every other day. (Patient not taking: Reported on 12/28/2021)     rosuvastatin (CRESTOR) 5 MG tablet Take 1 tablet (5 mg total) by mouth 2 (two) times a week. Sometimes 3 times a week 45 tablet 2   Simethicone (GAS-X PO) Take 250 mg by mouth daily as needed.     vitamin B-12 (CYANOCOBALAMIN) 1000 MCG tablet Take 1,000 mcg by mouth daily.     VTAMA 1 % CREA Apply topically. (Patient not taking: Reported on 11/08/2021)     No current facility-administered medications for this visit.    SURGICAL HISTORY:  Past Surgical History:  Procedure Laterality Date   benign rectal growth  2004   removed by Dr. Zella Richer   BIOPSY  08/09/2018   Procedure: BIOPSY;  Surgeon: Carol Ada, MD;  Location: [place];  Service: Endoscopy;;   BIOPSY  08/22/2018   Procedure: BIOPSY;  Surgeon: Juanita Craver, MD;  Location: [place];  Service: Endoscopy;;   BIOPSY  03/07/2019   Procedure: BIOPSY;  Surgeon: Irving Copas., MD;  Location: [place];  Service: Gastroenterology;;   BIOPSY  08/06/2020   Procedure: BIOPSY;  Surgeon: Irving Copas., MD;  Location: [place];  Service: Gastroenterology;;   BREAST BIOPSY Right    BREAST EXCISIONAL BIOPSY Left    BREAST SURGERY  1988   biopsy   CESAREAN SECTION     COLONOSCOPY N/A 08/22/2018   Procedure: COLONOSCOPY;  Surgeon: Juanita Craver, MD;  [facility] ENDOSCOPY;  Service:  Endoscopy;  Laterality: N/A;   ENDOSCOPIC MUCOSAL RESECTION N/A 03/07/2019   Procedure: ENDOSCOPIC MUCOSAL RESECTION;  Surgeon: Rush Landmark Telford Nab., MD;  Location: [place];  Service: Gastroenterology;  Laterality: N/A;   ESOPHAGOGASTRODUODENOSCOPY (EGD) WITH PROPOFOL N/A 08/04/2018   Procedure: ESOPHAGOGASTRODUODENOSCOPY (EGD) WITH PROPOFOL;  Surgeon: Ladene Artist, MD;  Location: [place];  Service: Gastroenterology;  Laterality: N/A;   ESOPHAGOGASTRODUODENOSCOPY (EGD) WITH PROPOFOL N/A 03/07/2019   Procedure: ESOPHAGOGASTRODUODENOSCOPY (EGD) WITH PROPOFOL;  Surgeon: Rush Landmark Telford Nab., MD;  Location: [place];  Service: Gastroenterology;  Laterality: N/A;   ESOPHAGOGASTRODUODENOSCOPY (EGD) WITH PROPOFOL N/A 08/06/2020   Procedure: ESOPHAGOGASTRODUODENOSCOPY (EGD) WITH PROPOFOL;  Surgeon: Rush Landmark Telford Nab., MD;  Location: [place];  Service: Gastroenterology;  Laterality: N/A;   EUS N/A 03/07/2019   Procedure: UPPER ENDOSCOPIC ULTRASOUND (EUS) RADIAL;  Surgeon: Irving Copas., MD;  Location: [place];  Service: Gastroenterology;  Laterality: N/A;   EUS N/A 08/06/2020   Procedure: UPPER ENDOSCOPIC ULTRASOUND (EUS) RADIAL;  Surgeon: Irving Copas., MD;  Location: [place];  Service: Gastroenterology;  Laterality: N/A;   FINE NEEDLE ASPIRATION  03/07/2019   Procedure: FINE NEEDLE ASPIRATION (FNA) LINEAR;  Surgeon: Irving Copas., MD;  Location: [place];  Service: Gastroenterology;;   FINE NEEDLE ASPIRATION  08/06/2020   Procedure: FINE NEEDLE ASPIRATION;  Surgeon: Irving Copas., MD;  Location: [place];  Service: Gastroenterology;;   Otho Darner SIGMOIDOSCOPY N/A 08/04/2018   Procedure: Beryle Quant;  Surgeon: Ladene Artist, MD;  Location: [place];  Service: Gastroenterology;  Laterality: N/A;   FLEXIBLE SIGMOIDOSCOPY N/A 08/09/2018   Procedure: FLEXIBLE SIGMOIDOSCOPY;  Surgeon: Carol Ada, MD;   Location: [place];  Service: Endoscopy;  Laterality: N/A;   HEMOSTASIS CLIP PLACEMENT  03/07/2019   Procedure: HEMOSTASIS CLIP PLACEMENT;  Surgeon: Rush Landmark Telford Nab., MD;  Location: [place];  Service: Gastroenterology;;   SUBMUCOSAL LIFTING INJECTION  03/07/2019   Procedure: SUBMUCOSAL LIFTING INJECTION;  Surgeon: Irving Copas., MD;  Location: [place];  Service: Gastroenterology;;   VIDEO BRONCHOSCOPY WITH ENDOBRONCHIAL NAVIGATION N/A 03/14/2018   Procedure: VIDEO BRONCHOSCOPY WITH ENDOBRONCHIAL NAVIGATION;  Surgeon: Collene Gobble, MD;  Location: [facility];  Service: Thoracic;  Laterality: N/A;    REVIEW OF SYSTEMS:  A comprehensive review of systems was negative except for: Constitutional: positive for fatigue Gastrointestinal: positive for abdominal pain   PHYSICAL EXAMINATION: General appearance: alert, cooperative, fatigued, and no distress Head: Normocephalic, without obvious abnormality, atraumatic Neck: no adenopathy, no JVD, supple, symmetrical, trachea midline, and thyroid not enlarged, symmetric, no tenderness/mass/nodules Lymph nodes: Cervical, supraclavicular, and axillary nodes normal. Resp: clear to auscultation bilaterally Back: symmetric, no curvature. ROM normal. No CVA tenderness. Cardio: regular rate and rhythm, S1, S2 normal, no murmur, click, rub or gallop GI: soft, non-tender; bowel sounds normal; no masses,  no organomegaly Extremities: extremities normal, atraumatic, no cyanosis or edema  ECOG PERFORMANCE STATUS: 1 - Symptomatic but completely ambulatory  Blood pressure 126/60, pulse (!) 55, temperature 98.7 F (37.1 C), temperature source Oral, resp. rate 16, weight 147 lb (66.7 kg), SpO2 99 %.  LABORATORY DATA: Lab Results  Component Value Date   WBC 6.7 12/21/2021   HGB 12.8 12/21/2021   HCT 38.3 12/21/2021   MCV 92.7 12/21/2021   PLT 300 12/21/2021      Chemistry      Component Value Date/Time   NA 139 12/21/2021 0913   NA  141 09/03/2018 0824   K 4.3 12/21/2021 0913   CL 103 12/21/2021 0913   CO2 29 12/21/2021 0913   BUN 13 12/21/2021 0913   BUN 8 09/03/2018 0824   CREATININE 1.04 (H) 12/21/2021 0913   CREATININE 1.08 (H) 11/05/2019 0844      Component Value Date/Time   CALCIUM 9.6 12/21/2021 0913   ALKPHOS 66 12/21/2021 0913   AST 18 12/21/2021 0913   ALT 21 12/21/2021 0913   BILITOT 0.6 12/21/2021 0913       RADIOGRAPHIC STUDIES: No results found.   ASSESSMENT AND PLAN: This is a very  pleasant 76 years old white female with suspicious carcinoid tumor of the duodenum as well as a small pulmonary nodules.  The patient has no clear pathology confirming her diagnosis but highly suspicious on the imaging studies. She had repeat PET dotatate scan on December 14, 2020 that showed focal radiotracer activity localized in the second portion of the duodenum corresponding to enhancing lesion on the comparison CT and consistent with a well-differentiated small neuroendocrine tumor.  There was no evidence of metastatic adenopathy in the abdomen and pelvis and no liver or mesenteric metastasis.  The patient had new bilateral pulmonary nodularity with discrete nodule in the right lung and peribronchial thickening and small nodularity in the right middle lobe and lingula but no activity associated with these lesions. She is followed by Dr. Lamonte Sakai from pulmonary medicine. She was seen recently at [facility] by Dr. Leslie Andrea as well as a GI surgeon Dr. [name] and both of them recommended for her to continue on observation. She had repeat blood work performed recently that showed no concerning finding except for further increase in her chromogranin level.  24-hour urine 5 HIAA is still pending. The patient is currently asymptomatic except for intermittent abdominal pain that could be secondary to hiatal hernia or other gastrointestinal issues.  I recommended for her to continue on observation with repeat blood  work in 6 months. If she is considered for treatment in the future, I will be happy to do it in [place].  She will continue to see Dr. Leamon Arnt on as-needed basis. For the history of pulmonary nodules, she is followed by Dr. Lamonte Sakai and she is supposed to have repeat CT scan of the chest early next months. The patient will continue her current treatment with Xarelto for the pulmonary embolism. She was advised to call immediately if she has any other concerning symptoms in the interval.  The patient voices understanding of current disease status and treatment options and is in agreement with the current care plan.  All questions were answered. The patient knows to call the clinic with any problems, questions or concerns. We can certainly see the patient much sooner if necessary. The total time spent in the appointment was 30 minutes.   Disclaimer: This note was dictated with voice recognition software. Similar sounding words can inadvertently be transcribed and may not be corrected upon review.

## 2021-12-31 LAB — 5 HIAA, QUANTITATIVE, URINE, 24 HOUR
5-HIAA, Ur: 2.7 mg/L
5-HIAA,Quant.,24 Hr Urine: 4.3 mg/24 hr (ref 0.0–14.9)
Total Volume: 1600

## 2022-01-14 ENCOUNTER — Ambulatory Visit (HOSPITAL_COMMUNITY)
Admission: RE | Admit: 2022-01-14 | Discharge: 2022-01-14 | Disposition: A | Discharge status: Home / Self Care | Payer: Medicare Other | Source: Ambulatory Visit | Attending: Emergency Medicine | Admitting: Emergency Medicine

## 2022-01-14 ENCOUNTER — Encounter (HOSPITAL_COMMUNITY): Payer: Self-pay

## 2022-01-14 DIAGNOSIS — I2510 Atherosclerotic heart disease of native coronary artery without angina pectoris: Secondary | ICD-10-CM | POA: Diagnosis not present

## 2022-01-14 DIAGNOSIS — R918 Other nonspecific abnormal finding of lung field: Secondary | ICD-10-CM | POA: Diagnosis present

## 2022-01-14 DIAGNOSIS — K449 Diaphragmatic hernia without obstruction or gangrene: Secondary | ICD-10-CM | POA: Insufficient documentation

## 2022-01-14 DIAGNOSIS — I700 Atherosclerosis of aorta: Secondary | ICD-10-CM | POA: Insufficient documentation

## 2022-01-19 DIAGNOSIS — K8020 Calculus of gallbladder without cholecystitis without obstruction: Secondary | ICD-10-CM | POA: Insufficient documentation

## 2022-01-19 DIAGNOSIS — I2699 Other pulmonary embolism without acute cor pulmonale: Secondary | ICD-10-CM | POA: Insufficient documentation

## 2022-01-19 DIAGNOSIS — D3A8 Other benign neuroendocrine tumors: Secondary | ICD-10-CM | POA: Insufficient documentation

## 2022-01-19 DIAGNOSIS — I2510 Atherosclerotic heart disease of native coronary artery without angina pectoris: Secondary | ICD-10-CM | POA: Insufficient documentation

## 2022-01-19 DIAGNOSIS — L570 Actinic keratosis: Secondary | ICD-10-CM | POA: Insufficient documentation

## 2022-01-20 ENCOUNTER — Encounter: Payer: Self-pay | Admitting: Emergency Medicine

## 2022-01-20 ENCOUNTER — Ambulatory Visit (INDEPENDENT_AMBULATORY_CARE_PROVIDER_SITE_OTHER): Payer: Medicare Other | Admitting: Emergency Medicine

## 2022-01-20 VITALS — BP 146/82 | HR 77 | Temp 98.0°F | Ht 61.0 in | Wt 147.6 lb

## 2022-01-20 DIAGNOSIS — R918 Other nonspecific abnormal finding of lung field: Secondary | ICD-10-CM | POA: Diagnosis not present

## 2022-01-20 NOTE — Patient Instructions (Addendum)
We reviewed your CT scan of the chest today. We will plan to repeat a CT chest June 2024 to follow pulmonary nodules. Follow Dr. Lamonte Sakai in June after CT so we can review the results together. Please call and make an appointment to see Dr. Lamonte Sakai if you decide to get a PET scan sooner.

## 2022-01-20 NOTE — Addendum Note (Signed)
Addended by: Gavin Potters R on: 01/20/2022 10:36 AM   Modules accepted: Orders

## 2022-01-20 NOTE — Assessment & Plan Note (Signed)
Waxing and waning pulmonary nodular disease.  Question whether this is autoimmune as her biopsies in 2020 were negative.  She is following with Dr. Julien Nordmann for her history of possible carcinoid tumor.  She may end up getting a PET scan and if so we will review.  She has an enlarging left upper lobe pulmonary nodule, 9 mm from 7 mm.  Other nodules have decreased in size.  We will plan to repeat her CT chest at the 62-month mark, review a PET scan sooner if 1 gets done.  We talked about the options for navigational bronchoscopy.  Ultimately we may decide to repeat depending on interval change in the nodule.

## 2022-01-20 NOTE — Progress Notes (Signed)
Subjective:    Patient ID: [name], female    DOB: 01-04-1946, 76 y.o.   MRN: [id number]  HPI  ROV 01/20/2022 --pleasant 76 year old woman with a history of former tobacco use (10 pack years), psoriasis (previously on Enbrel), PE, duodenal neuroendocrine tumor.  She is followed for chronic bronchitic symptoms and waxing and waning pulmonary nodular disease (biopsy negative 02/2018).  She has restrictive disease on pulmonary function testing.  Repeat CT scan of the chest 01/14/2022 reviewed by me, shows a decrease in size of left-sided pulmonary nodule to 1.6 x 0.9 cm.  Other nodules overall stable with the exception of a paramediastinal left upper lobe nodule, 9 mm, previously 7 mm.   Review of Systems As per HPI  Past Medical History:  Diagnosis Date   ALLERGIC RHINITIS    ANEMIA-NOS    Arthritis    ASTHMA    Breast discharge    Carpal tunnel syndrome    COLONIC POLYPS, HX OF    Coronary artery disease    "mild CAD" noted on 12/05/17 in coronary CT scan   DIABETES MELLITUS, TYPE II    diet controlled   GERD    Hand tingling    HYPERLIPIDEMIA    HYPERTENSION    Neuroendocrine tumor    OSTEOPENIA    PONV (postoperative nausea and vomiting)    " it relaxes my bladder muscles and I have been known to pee all over the place"   Psoriasis    severe, began soriatane 01/2012   Pulmonary embolism ([place])    Rectal fissure    Scoliosis    Wears glasses      Family History  Problem Relation Age of Onset   Lung cancer Father    Arthritis Other        Parents   Asthma Other        parent, other relative   Breast cancer Other        other relative   Hypertension Other        parent, other relative   Heart disease Other        parent, other relative   Heart disease Mother    Asthma Mother    Breast cancer Maternal Aunt 68   Parkinson's disease Maternal Grandmother    Rheumatic fever Maternal Grandfather      Social History   Socioeconomic History   Marital  status: Single    Spouse name: Not on file   Number of children: 1   Years of education: 16   Highest education level: Bachelor's degree (e.g., BA, AB, BS)  Occupational History   Occupation: retired Education officer, 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  Has one child and one grandchild.  Retired Careers adviser.  Education: college.    Social Determinants of Health   Financial Resource Strain: Low Risk  (10/04/2021)   Overall Financial Resource  Strain (CARDIA)    Difficulty of Paying Living Expenses: Not hard at all  Food Insecurity: No Food Insecurity (10/04/2021)   Hunger Vital Sign    Worried About Running Out of Food in the Last Year: Never true    Ran Out of Food in the Last Year: Never true  Transportation Needs: No Transportation Needs (10/04/2021)   PRAPARE - Hydrologist (Medical): No    Lack of Transportation (Non-Medical): No  Physical Activity: Insufficiently Active (10/04/2021)   Exercise Vital Sign    Days of Exercise per Week: 3 days    Minutes of Exercise per Session: 30 min  Stress: No Stress Concern Present (10/04/2021)   [place]    Feeling of Stress : Not at all  Social Connections: Socially Isolated (10/04/2021)   Social Connection and Isolation Panel [NHANES]    Frequency of Communication with Friends and Family: Three times a week    Frequency of Social Gatherings with Friends and Family: Three times a week     Attends Religious Services: Never    Active Member of Clubs or Organizations: No    Attends Archivist Meetings: Never    Marital Status: Widowed  Intimate Partner Violence: Not At Risk (10/04/2021)   Humiliation, Afraid, Rape, and Kick questionnaire    Fear of Current or Ex-Partner: No    Emotionally Abused: No    Physically Abused: No    Sexually Abused: No     Allergies  Allergen Reactions   Phenergan [Promethazine Hcl] Shortness Of Breath and Anxiety   Repatha [Evolocumab]     Muscle pain and weakness   Amlodipine Swelling   Clarithromycin Nausea And Vomiting    May have episodes of syncope due to prolonged vomiting    Codeine Nausea And Vomiting    May have episodes of syncope due to prolonged vomiting   Erythromycin Nausea And Vomiting   Hydralazine Hcl     Drug-induced lupus - pt wishes to avoid   Statins     Muscle pain    Tetracycline Nausea And Vomiting     Outpatient Medications Prior to Visit  Medication Sig Dispense Refill   apixaban (ELIQUIS) 5 MG TABS tablet Take 1 tablet (5 mg total) by mouth 2 (two) times daily. 180 tablet 2   Ascorbic Acid (VITAMIN C GUMMIE PO) Take 282 mg by mouth every evening. Each tablet 141 mg     calcium carbonate (TUMS EX) 750 MG chewable tablet Chew 750 mg by mouth daily.     Cholecalciferol (VITAMIN D3 GUMMIES ADULT PO) Take 2,000 Units by mouth every evening.     clindamycin (CLEOCIN T) 1 % external solution Apply 1 application topically daily as needed (wounds).      clobetasol (TEMOVATE) 0.05 % external solution Apply topically daily.     Coenzyme Q10 (COQ10 GUMMIES ADULT PO) Take 200 mg by mouth in the morning and at bedtime.     COVID-19 mRNA vaccine 2023-2024 (COMIRNATY) syringe Inject into the muscle. 0.3 mL 0   CREON 36000-114000 units CPEP capsule Take 36,000 Units by mouth 2 (two) times daily with a meal.     diphenhydrAMINE (BENADRYL) 25 MG tablet Take 25 mg by mouth at bedtime.     FINACEA 15 % gel Apply  topically 2 (two) times daily.     folic acid (FOLVITE) 1 MG tablet TAKE 1 TABLET BY MOUTH EVERY DAY 30 tablet 5  halobetasol (ULTRAVATE) 0.05 % cream APPLY TO AFFECTED AREA TWICE A DAY     icosapent Ethyl (VASCEPA) 1 g capsule Take 1 capsule (1 g total) by mouth 2 (two) times daily. 360 capsule 3   lisinopril (ZESTRIL) 10 MG tablet Take 0.5 tablets (5 mg total) by mouth daily. (Patient taking differently: Take 10 mg by mouth daily.) 30 tablet 5   Multiple Vitamins-Minerals (ADULT GUMMY PO) Take 1 tablet by mouth every evening. Children [employer] gummie formula     omeprazole (PRILOSEC OTC) 20 MG tablet Take 20 mg by mouth daily.     ondansetron (ZOFRAN-ODT) 8 MG disintegrating tablet Take 1 tablet (8 mg total) by mouth every 8 (eight) hours as needed for nausea. 60 tablet 2   ONETOUCH ULTRA test strip USE TEST STRIPS TO CHECK BLOOD SUGAR DAILY AND AS NEEDED. E11.9 300 strip 1   oxymetazoline (AFRIN) 0.05 % nasal spray Place 1 spray into both nostrils 2 (two) times daily.     oxymetazoline (NASAL RELIEF) 0.05 % nasal spray Place into the nose.     polyethylene glycol (MIRALAX / GLYCOLAX) 17 g packet Take 17 g by mouth every other day.     rosuvastatin (CRESTOR) 5 MG tablet Take 1 tablet (5 mg total) by mouth 2 (two) times a week. Sometimes 3 times a week 45 tablet 2   Simethicone (GAS-X PO) Take 250 mg by mouth daily as needed.     vitamin B-12 (CYANOCOBALAMIN) 1000 MCG tablet Take 1,000 mcg by mouth daily.     VTAMA 1 % CREA Apply topically.     nebivolol (BYSTOLIC) 2.5 MG tablet TAKE 1 TABLET BY MOUTH EVERY DAY *BRAND BYSTOLIC* (Patient not taking: Reported on 01/20/2022) 30 tablet 5   No facility-administered medications prior to visit.         Objective:   Physical Exam  Vitals:   01/20/22 0953  BP: (!) 146/82  Pulse: 77  Temp: 98 F (36.7 C)  TempSrc: Oral  SpO2: 98%  Weight: 147 lb 9.6 oz (67 kg)  Height: 5\' 1"  (1.549 m)   Gen: Pleasant, overweight woman, in no distress,   normal affect  ENT: No lesions,  mouth clear,  oropharynx clear, no postnasal drip  Neck: No JVD, no stridor  Lungs: No use of accessory muscles, no crackles or wheezing on normal respiration, no wheeze on forced expiration  Cardiovascular: RRR, heart sounds normal, no murmur or gallops, no peripheral edema  Musculoskeletal: No deformities, no cyanosis or clubbing  Neuro: alert, awake, non focal  Skin: Warm, no lesions or rash     Assessment & Plan:   Multiple pulmonary nodules determined by computed tomography of lung Waxing and waning pulmonary nodular disease.  Question whether this is autoimmune as her biopsies in 2020 were negative.  She is following with Dr. Julien Nordmann for her history of possible carcinoid tumor.  She may end up getting a PET scan and if so we will review.  She has an enlarging left upper lobe pulmonary nodule, 9 mm from 7 mm.  Other nodules have decreased in size.  We will plan to repeat her CT chest at the 80-month mark, review a PET scan sooner if 1 gets done.  We talked about the options for navigational bronchoscopy.  Ultimately we may decide to repeat depending on interval change in the nodule.  Time spent 31 minutes  Baltazar Apo, MD, PhD 01/20/2022, 10:25 AM [facility] [phone] or if no answer  before 7:00PM call [phone] For any issues after 7:00PM please call eLink [phone]

## 2022-02-27 ENCOUNTER — Other Ambulatory Visit: Payer: Self-pay | Admitting: Internal Medicine

## 2022-02-28 ENCOUNTER — Other Ambulatory Visit: Payer: Self-pay

## 2022-04-06 ENCOUNTER — Encounter: Payer: Self-pay | Admitting: Internal Medicine

## 2022-04-06 NOTE — Progress Notes (Unsigned)
Subjective:    Patient ID: [name], female    DOB: 1945/06/05, 77 y.o.   MRN: [id number]     HPI [name] is here for follow up of her chronic medical problems, including htn, DM, hld, CKD, leg edema, vit d def, h/o PE, pancreatic insuff  Pulm nodules - following with Dr Lamonte Sakai  Possible carcinoid treatment - in observation.    Medications and allergies reviewed with patient and updated if appropriate.  Current Outpatient Medications on File Prior to Visit  Medication Sig Dispense Refill   apixaban (ELIQUIS) 5 MG TABS tablet Take 1 tablet (5 mg total) by mouth 2 (two) times daily. 180 tablet 2   Ascorbic Acid (VITAMIN C GUMMIE PO) Take 282 mg by mouth every evening. Each tablet 141 mg     calcium carbonate (TUMS EX) 750 MG chewable tablet Chew 750 mg by mouth daily.     Cholecalciferol (VITAMIN D3 GUMMIES ADULT PO) Take 2,000 Units by mouth every evening.     clindamycin (CLEOCIN T) 1 % external solution Apply 1 application topically daily as needed (wounds).      clobetasol (TEMOVATE) 0.05 % external solution Apply topically daily.     Coenzyme Q10 (COQ10 GUMMIES ADULT PO) Take 200 mg by mouth in the morning and at bedtime.     COVID-19 mRNA vaccine 2023-2024 (COMIRNATY) syringe Inject into the muscle. 0.3 mL 0   CREON 36000-114000 units CPEP capsule Take 36,000 Units by mouth 2 (two) times daily with a meal.     diphenhydrAMINE (BENADRYL) 25 MG tablet Take 25 mg by mouth at bedtime.     FINACEA 15 % gel Apply topically 2 (two) times daily.     folic acid (FOLVITE) 1 MG tablet TAKE 1 TABLET BY MOUTH EVERY DAY 30 tablet 5   halobetasol (ULTRAVATE) 0.05 % cream APPLY TO AFFECTED AREA TWICE A DAY     icosapent Ethyl (VASCEPA) 1 g capsule Take 1 capsule (1 g total) by mouth 2 (two) times daily. 360 capsule 3   lisinopril (ZESTRIL) 10 MG tablet TAKE 1 TABLET BY MOUTH EVERY DAY 30 tablet 5   Multiple Vitamins-Minerals (ADULT GUMMY PO) Take 1 tablet by mouth every  evening. Children [employer] gummie formula     nebivolol (BYSTOLIC) 2.5 MG tablet TAKE 1 TABLET BY MOUTH EVERY DAY *BRAND BYSTOLIC* (Patient not taking: Reported on 01/20/2022) 30 tablet 5   omeprazole (PRILOSEC OTC) 20 MG tablet Take 20 mg by mouth daily.     ondansetron (ZOFRAN-ODT) 8 MG disintegrating tablet Take 1 tablet (8 mg total) by mouth every 8 (eight) hours as needed for nausea. 60 tablet 2   ONETOUCH ULTRA test strip USE TEST STRIPS TO CHECK BLOOD SUGAR DAILY AND AS NEEDED. E11.9 300 strip 1   oxymetazoline (AFRIN) 0.05 % nasal spray Place 1 spray into both nostrils 2 (two) times daily.     oxymetazoline (NASAL RELIEF) 0.05 % nasal spray Place into the nose.     polyethylene glycol (MIRALAX / GLYCOLAX) 17 g packet Take 17 g by mouth every other day.     rosuvastatin (CRESTOR) 5 MG tablet Take 1 tablet (5 mg total) by mouth 2 (two) times a week. Sometimes 3 times a week 45 tablet 2   Simethicone (GAS-X PO) Take 250 mg by mouth daily as needed.     vitamin B-12 (CYANOCOBALAMIN) 1000 MCG tablet Take 1,000 mcg by mouth daily.     VTAMA 1 %  CREA Apply topically.     No current facility-administered medications on file prior to visit.     Review of Systems     Objective:  There were no vitals filed for this visit. BP Readings from Last 3 Encounters:  01/20/22 (!) 146/82  12/28/21 126/60  11/08/21 132/84   Wt Readings from Last 3 Encounters:  01/20/22 147 lb 9.6 oz (67 kg)  12/28/21 147 lb (66.7 kg)  11/08/21 147 lb 12.8 oz (67 kg)   There is no height or weight on file to calculate BMI.    Physical Exam     Lab Results  Component Value Date   WBC 6.7 12/21/2021   HGB 12.8 12/21/2021   HCT 38.3 12/21/2021   PLT 300 12/21/2021   GLUCOSE 111 (H) 12/21/2021   CHOL 205 (H) 10/05/2021   TRIG 239.0 (H) 10/05/2021   HDL 48.20 10/05/2021   LDLDIRECT 113.0 10/05/2021   LDLCALC 109 (H) 03/23/2021   ALT 21 12/21/2021   AST 18 12/21/2021   NA 139 12/21/2021   K 4.3  12/21/2021   CL 103 12/21/2021   CREATININE 1.04 (H) 12/21/2021   BUN 13 12/21/2021   CO2 29 12/21/2021   TSH 2.63 11/11/2020   INR 1.9 (H) 06/23/2020   HGBA1C 6.9 (H) 10/05/2021   MICROALBUR <0.7 10/05/2021     Assessment & Plan:    See Problem List for Assessment and Plan of chronic medical problems.

## 2022-04-06 NOTE — Patient Instructions (Addendum)
      Blood work was ordered.   The lab is on the first floor.    Medications changes include :       A referral was ordered for XXX.     Someone will call you to schedule an appointment.    Return in about 6 months (around 10/06/2022) for Physical Exam.

## 2022-04-07 ENCOUNTER — Encounter: Payer: Self-pay | Admitting: Internal Medicine

## 2022-04-07 ENCOUNTER — Ambulatory Visit (INDEPENDENT_AMBULATORY_CARE_PROVIDER_SITE_OTHER): Payer: Medicare Other | Admitting: Internal Medicine

## 2022-04-07 VITALS — BP 140/82 | HR 75 | Temp 98.1°F | Ht 61.0 in | Wt 147.0 lb

## 2022-04-07 DIAGNOSIS — N1831 Chronic kidney disease, stage 3a: Secondary | ICD-10-CM | POA: Diagnosis not present

## 2022-04-07 DIAGNOSIS — I10 Essential (primary) hypertension: Secondary | ICD-10-CM

## 2022-04-07 DIAGNOSIS — E118 Type 2 diabetes mellitus with unspecified complications: Secondary | ICD-10-CM | POA: Diagnosis not present

## 2022-04-07 DIAGNOSIS — Z862 Personal history of diseases of the blood and blood-forming organs and certain disorders involving the immune mechanism: Secondary | ICD-10-CM

## 2022-04-07 DIAGNOSIS — R600 Localized edema: Secondary | ICD-10-CM

## 2022-04-07 DIAGNOSIS — E785 Hyperlipidemia, unspecified: Secondary | ICD-10-CM

## 2022-04-07 DIAGNOSIS — K8681 Exocrine pancreatic insufficiency: Secondary | ICD-10-CM

## 2022-04-07 DIAGNOSIS — D508 Other iron deficiency anemias: Secondary | ICD-10-CM

## 2022-04-07 DIAGNOSIS — E538 Deficiency of other specified B group vitamins: Secondary | ICD-10-CM

## 2022-04-07 DIAGNOSIS — E559 Vitamin D deficiency, unspecified: Secondary | ICD-10-CM | POA: Diagnosis not present

## 2022-04-07 DIAGNOSIS — Z86711 Personal history of pulmonary embolism: Secondary | ICD-10-CM

## 2022-04-07 LAB — IBC PANEL
Iron: 117 ug/dL (ref 42–145)
Saturation Ratios: 24.7 % (ref 20.0–50.0)
TIBC: 473.2 ug/dL — ABNORMAL HIGH (ref 250.0–450.0)
Transferrin: 338 mg/dL (ref 212.0–360.0)

## 2022-04-07 LAB — COMPREHENSIVE METABOLIC PANEL
ALT: 31 U/L (ref 0–35)
AST: 23 U/L (ref 0–37)
Albumin: 4 g/dL (ref 3.5–5.2)
Alkaline Phosphatase: 76 U/L (ref 39–117)
BUN: 20 mg/dL (ref 6–23)
CO2: 29 mEq/L (ref 19–32)
Calcium: 9.6 mg/dL (ref 8.4–10.5)
Chloride: 102 mEq/L (ref 96–112)
Creatinine, Ser: 1.1 mg/dL (ref 0.40–1.20)
GFR: 48.15 mL/min — ABNORMAL LOW (ref >60.00)
Glucose, Bld: 118 mg/dL — ABNORMAL HIGH (ref 70–99)
Potassium: 4.1 mEq/L (ref 3.5–5.1)
Sodium: 139 mEq/L (ref 135–145)
Total Bilirubin: 0.7 mg/dL (ref 0.2–1.2)
Total Protein: 7.1 g/dL (ref 6.0–8.3)

## 2022-04-07 LAB — VITAMIN B12: Vitamin B-12: 667 pg/mL (ref 211–911)

## 2022-04-07 LAB — CBC WITH DIFFERENTIAL/PLATELET
Basophils Absolute: 0.1 10*3/uL (ref 0.0–0.1)
Basophils Relative: 1.3 % (ref 0.0–3.0)
Eosinophils Absolute: 0.3 10*3/uL (ref 0.0–0.7)
Eosinophils Relative: 3.7 % (ref 0.0–5.0)
HCT: 37.7 % (ref 36.0–46.0)
Hemoglobin: 12.4 g/dL (ref 12.0–15.0)
Lymphocytes Relative: 20.6 % (ref 12.0–46.0)
Lymphs Abs: 1.6 10*3/uL (ref 0.7–4.0)
MCHC: 32.9 g/dL (ref 30.0–36.0)
MCV: 90.4 fl (ref 78.0–100.0)
Monocytes Absolute: 0.5 10*3/uL (ref 0.1–1.0)
Monocytes Relative: 6.7 % (ref 3.0–12.0)
Neutro Abs: 5.3 10*3/uL (ref 1.4–7.7)
Neutrophils Relative %: 67.7 % (ref 43.0–77.0)
Platelets: 317 10*3/uL (ref 150.0–400.0)
RBC: 4.17 Mil/uL (ref 3.87–5.11)
RDW: 15.9 % — ABNORMAL HIGH (ref 11.5–15.5)
WBC: 7.8 10*3/uL (ref 4.0–10.5)

## 2022-04-07 LAB — VITAMIN D 25 HYDROXY (VIT D DEFICIENCY, FRACTURES): VITD: 37.63 ng/mL (ref 30.00–100.00)

## 2022-04-07 LAB — LIPID PANEL
Cholesterol: 210 mg/dL — ABNORMAL HIGH (ref 0–200)
HDL: 59.5 mg/dL (ref >39.00)
LDL Cholesterol: 117 mg/dL — ABNORMAL HIGH (ref 0–99)
NonHDL: 150.02
Total CHOL/HDL Ratio: 4
Triglycerides: 165 mg/dL — ABNORMAL HIGH (ref 0.0–149.0)
VLDL: 33 mg/dL (ref 0.0–40.0)

## 2022-04-07 LAB — FERRITIN: Ferritin: 13.5 ng/mL (ref 10.0–291.0)

## 2022-04-07 LAB — HEMOGLOBIN A1C: Hgb A1c MFr Bld: 7 % — ABNORMAL HIGH (ref 4.6–6.5)

## 2022-04-07 MED ORDER — ONDANSETRON 8 MG PO TBDP: 8.0000 mg | ORAL_TABLET | Freq: Three times a day (TID) | ORAL | 2 refills | Status: DC | PRN

## 2022-04-07 NOTE — Assessment & Plan Note (Signed)
Chronic Taking vitamin B12 Check B12 level

## 2022-04-07 NOTE — Assessment & Plan Note (Signed)
Chronic Taking vitamin D daily Check vitamin D level  

## 2022-04-07 NOTE — Assessment & Plan Note (Signed)
Chronic Controlled Stressed regular walking, elevating legs when sitting

## 2022-04-07 NOTE — Assessment & Plan Note (Signed)
Chronic Regular exercise and healthy diet encouraged Check lipid panel  Continue Crestor 5 mg twice weekly

## 2022-04-07 NOTE — Assessment & Plan Note (Signed)
Chronic Blood pressure well controlled CMP No longer taking Bystolic Continue lisinopril 10 mg daily

## 2022-04-07 NOTE — Assessment & Plan Note (Addendum)
Chronic Saw Dr. Queen Blossom longer seen Mild, stable CMP, CBC

## 2022-04-07 NOTE — Assessment & Plan Note (Signed)
History of iron deficiency anemia CBC, iron panel

## 2022-04-07 NOTE — Assessment & Plan Note (Signed)
Chronic Unprovoked On lifelong anticoagulation Continue Eliquis 5 mg twice daily CBC, CMP

## 2022-04-07 NOTE — Assessment & Plan Note (Addendum)
Chronic Following with GI On [place] Has intermittent abdominal pain, nausea and mild constipation Scheduled for an EGD next month

## 2022-04-07 NOTE — Assessment & Plan Note (Addendum)
Chronic   Lab Results  Component Value Date   HGBA1C 6.9 (H) 10/05/2021   Sugars controlled Check A1c Continue lifestyle control Stressed regular exercise, diabetic diet-she has been eating more sugars and carbs

## 2022-04-08 ENCOUNTER — Other Ambulatory Visit: Payer: Self-pay | Admitting: Internal Medicine

## 2022-04-09 ENCOUNTER — Other Ambulatory Visit: Payer: Self-pay | Admitting: Internal Medicine

## 2022-04-11 ENCOUNTER — Other Ambulatory Visit: Payer: Self-pay

## 2022-04-25 NOTE — Progress Notes (Deleted)
Cardiology Office Note:    Date:  04/25/2022   ID:  [name], DOB [birth year], MRN [id number]  PCP:  [name], MD   [facility] Providers Cardiologist:  Freada Bergeron, MD {   Referring MD: [name], MD    History of Present Illness:    [name] is a 77 y.o. female with a hx of difficult to control hypertension, hyperlipidemia, mild nonobstructive CAD, PE, aortic atherosclerosis who was previously followed by Dr. [name] who now returns to clinic for follow-up.  Per review of the record, Cardiac CTA 11/2017 with mild nonobstructive CAD. She has had difficulty with lipid control. Intolerant to pravastatin, atorvastatin, pitavastatin. She reports Repatha caused "paralysis". She has concerns about fish oil and bleeding. She was started on crestor '5mg'$  twice per week.  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 Was also complaining of dyspnea on exertion. We obtained a myoview 05/2021 which was negative. TTE showed LVEF 60-65%, mild LVH, normal RV, no significant valve disease.   Was last seen in clinic on 11/08/21 where she was having fluctuating blood pressures. We continued the lisinopril at that time but told her she could cut back on dosing if BP was on the low side.  Today, ***   Past Medical History:  Diagnosis Date   ALLERGIC RHINITIS    ANEMIA-NOS    Arthritis    ASTHMA    Breast discharge    Carpal tunnel syndrome    COLONIC POLYPS, HX OF    Coronary artery disease    "mild CAD" noted on 12/05/17 in coronary CT scan   DIABETES MELLITUS, TYPE II    diet controlled   GERD    Hand tingling    HYPERLIPIDEMIA    HYPERTENSION    Neuroendocrine tumor    OSTEOPENIA    PONV (postoperative nausea and vomiting)    " it relaxes my bladder muscles and I have been known to pee all over the place"   Psoriasis    severe, began soriatane 01/2012   Pulmonary embolism ([place])    Rectal  fissure    Scoliosis    Wears glasses     Past Surgical History:  Procedure Laterality Date   benign rectal growth  2004   removed by Dr. Zella Richer   BIOPSY  08/09/2018   Procedure: BIOPSY;  Surgeon: Carol Ada, MD;  Location: [place];  Service: Endoscopy;;   BIOPSY  08/22/2018   Procedure: BIOPSY;  Surgeon: Juanita Craver, MD;  Location: [facility] ENDOSCOPY;  Service: Endoscopy;;   BIOPSY  03/07/2019   Procedure: BIOPSY;  Surgeon: Irving Copas., MD;  Location: [place];  Service: Gastroenterology;;   BIOPSY  08/06/2020   Procedure: BIOPSY;  Surgeon: Irving Copas., MD;  Location[facility] ENDOSCOPY;  Service: Gastroenterology;;   BREAST BIOPSY Right    BREAST EXCISIONAL BIOPSY Left    BREAST SURGERY  1988   biopsy   CESAREAN SECTION     COLONOSCOPY N/A 08/22/2018   Procedure: COLONOSCOPY;  Surgeon: Juanita Craver, MD;  Location: [facility] ENDOSCOPY;  Service: Endoscopy;  Laterality: N/A;   ENDOSCOPIC MUCOSAL RESECTION N/A 03/07/2019   Procedure: ENDOSCOPIC MUCOSAL RESECTION;  Surgeon: Rush Landmark Telford Nab., MD;  Location: [place];  Service: Gastroenterology;  Laterality: N/A;   ESOPHAGOGASTRODUODENOSCOPY (EGD) WITH PROPOFOL N/A 08/04/2018   Procedure: ESOPHAGOGASTRODUODENOSCOPY (EGD) WITH PROPOFOL;  Surgeon: Ladene Artist, MD;  Location: [place];  Service: Gastroenterology;  Laterality: N/A;   ESOPHAGOGASTRODUODENOSCOPY (EGD) WITH PROPOFOL N/A 03/07/2019   Procedure: ESOPHAGOGASTRODUODENOSCOPY (EGD) WITH PROPOFOL;  Surgeon: Rush Landmark Telford Nab., MD;  Location: [place];  Service: Gastroenterology;  Laterality: N/A;   ESOPHAGOGASTRODUODENOSCOPY (EGD) WITH PROPOFOL N/A 08/06/2020   Procedure: ESOPHAGOGASTRODUODENOSCOPY (EGD) WITH PROPOFOL;  Surgeon: Rush Landmark Telford Nab., MD;  Location: [place];  Service: Gastroenterology;  Laterality: N/A;   EUS N/A 03/07/2019   Procedure: UPPER ENDOSCOPIC ULTRASOUND (EUS) RADIAL;  Surgeon: Irving Copas., MD;   Location: [place];  Service: Gastroenterology;  Laterality: N/A;   EUS N/A 08/06/2020   Procedure: UPPER ENDOSCOPIC ULTRASOUND (EUS) RADIAL;  Surgeon: Irving Copas., MD;  Location: [place];  Service: Gastroenterology;  Laterality: N/A;   FINE NEEDLE ASPIRATION  03/07/2019   Procedure: FINE NEEDLE ASPIRATION (FNA) LINEAR;  Surgeon: Irving Copas., MD;  Location: [place];  Service: Gastroenterology;;   FINE NEEDLE ASPIRATION  08/06/2020   Procedure: FINE NEEDLE ASPIRATION;  Surgeon: Irving Copas., MD;  Location: [place];  Service: Gastroenterology;;   Otho Darner SIGMOIDOSCOPY N/A 08/04/2018   Procedure: Beryle Quant;  Surgeon: Ladene Artist, MD;  Location: [place];  Service: Gastroenterology;  Laterality: N/A;   FLEXIBLE SIGMOIDOSCOPY N/A 08/09/2018   Procedure: FLEXIBLE SIGMOIDOSCOPY;  Surgeon: Carol Ada, MD;  Location: [place];  Service: Endoscopy;  Laterality: N/A;   HEMOSTASIS CLIP PLACEMENT  03/07/2019   Procedure: HEMOSTASIS CLIP PLACEMENT;  Surgeon: Rush Landmark Telford Nab., MD;  Location: [place];  Service: Gastroenterology;;   SUBMUCOSAL LIFTING INJECTION  03/07/2019   Procedure: SUBMUCOSAL LIFTING INJECTION;  Surgeon: Irving Copas., MD;  Location: [place];  Service: Gastroenterology;;   VIDEO BRONCHOSCOPY WITH ENDOBRONCHIAL NAVIGATION N/A 03/14/2018   Procedure: VIDEO BRONCHOSCOPY WITH ENDOBRONCHIAL NAVIGATION;  Surgeon: Collene Gobble, MD;  Location: [facility];  Service: Thoracic;  Laterality: N/A;    Current Medications: No outpatient medications have been marked as taking for the 04/26/22 encounter (Appointment) with Freada Bergeron, MD.     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  Has one child and one grandchild.  Retired Careers adviser.  Education: college.    Social Determinants of Health   Financial Resource Strain: Low Risk  (10/04/2021)   Overall Financial Resource Strain (CARDIA)    Difficulty of Paying Living Expenses: Not hard at all  Food Insecurity: No Food Insecurity (10/04/2021)   Hunger Vital Sign    Worried About Running Out of Food in the Last Year: Never true  Ran Out of Food in the Last Year: Never true  Transportation Needs: No Transportation Needs (10/04/2021)   PRAPARE - Hydrologist (Medical): No    Lack of Transportation (Non-Medical): No  Physical Activity: Insufficiently Active (10/04/2021)   Exercise Vital Sign    Days of Exercise per Week: 3 days    Minutes of Exercise per Session: 30 min  Stress: No Stress Concern Present (10/04/2021)   [place]    Feeling of Stress : Not at all  Social Connections: Socially Isolated (10/04/2021)   Social Connection and Isolation Panel [NHANES]    Frequency of Communication with Friends and Family: Three times a week    Frequency of Social Gatherings with Friends and Family: Three times a week    Attends Religious  Services: Never    Active Member of Clubs or Organizations: No    Attends Archivist Meetings: Never    Marital Status: Widowed     Family History: The patient's family history includes Arthritis in an other family member; Asthma in her mother and another family member; Breast cancer in an other family member; Breast cancer (age of onset: 63) in her maternal aunt; Heart disease in her mother and another family member; Hypertension in an other family member; Lung cancer in her father; Parkinson's disease in her maternal grandmother; Rheumatic fever in her maternal grandfather.  ROS:   Please see the history of present illness.    Review of Systems  Constitutional:  Negative for chills and fever.  HENT:  Negative for congestion and sore throat.   Eyes:  Negative for blurred vision and double vision.  Respiratory:  Negative for cough.   Cardiovascular:  Positive for chest pain (Costal pain) and leg swelling (Bilateral feet). Negative for palpitations, orthopnea, claudication and PND.  Gastrointestinal:  Negative for heartburn and nausea.  Genitourinary:  Negative for dysuria and urgency.  Musculoskeletal:  Positive for myalgias.  Skin:  Negative for itching and rash.  Neurological:  Negative for headaches.  Endo/Heme/Allergies:  Does not bruise/bleed easily.  Psychiatric/Behavioral:  The patient is not nervous/anxious and does not have insomnia.    All other systems reviewed and are negative.  EKGs/Labs/Other Studies Reviewed:    The following studies were reviewed today:  Myoview 05/2021:   The study is normal. The study is low risk.   No ST deviation was noted.   Left ventricular function is normal. Nuclear stress EF: 85 %. The left ventricular ejection fraction is hyperdynamic (>65%). End diastolic cavity size is normal.   Prior study available for comparison from 01/05/2016.  TTE 05/12/21: IMPRESSIONS   1. Left ventricular ejection fraction, by estimation, is 60 to  65%. The  left ventricle has normal function. The left ventricle has no regional  wall motion abnormalities. There is mild concentric left ventricular  hypertrophy. Left ventricular diastolic  parameters were normal.   2. Right ventricular systolic function is normal. The right ventricular  size is normal.   3. The mitral valve is normal in structure. No evidence of mitral valve  regurgitation. No evidence of mitral stenosis.   4. The aortic valve is normal in structure. Aortic valve regurgitation is  not visualized. No aortic stenosis is present.   5. The inferior vena cava is normal in size with greater than 50%  respiratory variability, suggesting right atrial pressure of 3 mmHg.   CT Chest 02/12/21 IMPRESSION: 1. 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 2. Aortic atherosclerosis, in addition to left main and three-vessel coronary artery disease. Assessment for potential risk factor modification, dietary therapy or pharmacologic therapy may be warranted, if clinically indicated. 3. There are calcifications of the aortic valve. Echocardiographic correlation for evaluation of potential valvular dysfunction may be warranted if clinically indicated. 4. Large hiatal hernia. 5. Hepatic steatosis.   Aortic Atherosclerosis (ICD10-I70.0).  CT chest: 12/22/2017 1. In addition to the previously noted mass in the left lower lobe there several other smaller irregular-shaped nodules scattered throughout the lungs bilaterally. The irregular shape of the nodules would be unusual for metastatic disease. The possibility of a benign etiology such as multifocal bronchoceles should be considered, but is a diagnosis of exclusion. Malignancy should be excluded, and further evaluation with PET-CT is again suggested. 2. Nonocclusive  filling defect in the distal left main pulmonary artery compatible with pulmonary embolism. 3. Hepatic steatosis. 4. Aortic atherosclerosis, in addition to left main and left anterior descending coronary artery disease. Assessment for potential risk factor modification, dietary therapy or pharmacologic therapy may be warranted, if clinically indicated. 5. There are calcifications of the aortic valve. Echocardiographic correlation for evaluation of potential valvular dysfunction may be warranted if clinically indicated.   Coronary CTA: 12/05/2017   1. Coronary calcium score of 6. This was 45 percentile for age and sex matched control. 2. Normal coronary origin with right dominance. 3. There is mild CAD in the proximal LAD, aggressive risk factor modification is recommended SOM most probably secondary to obesity and deconditioning.   PET CT: 01/01/2018   1. For the most part the branching nodularity in the lungs is of low activity and likely benign. Given the somewhat branching tubular appearance, the possibility of multifocal bronchocele is certainly raised. The lesion along the left upper lobe paramediastinal area, and in the right upper lobe just adjacent to the fissure do have some low-grade activity (1.6 and 1.7, respectively) which may well be inflammatory but for which surveillance imaging in 6 months time would be suggested. 2. The larger lesion in the left lower lobe is not hypermetabolic and is likely benign. Bilateral tonsillar activity in the neck is likely benign. 3. No worrisome findings in the neck, abdomen/pelvis, or skeleton.    EKG:  EKG is personally reviewed. 11/08/2021:  Sinus rhythm. Rate 69 bpm. Poor R wave progression.   Recent Labs: 04/07/2022: ALT 31; BUN 20; Creatinine, Ser 1.10; Hemoglobin 12.4; Platelets 317.0; Potassium 4.1; Sodium 139   Recent Lipid Panel    Component Value Date/Time   CHOL 210 (H) 04/07/2022 0902   CHOL 218 (H) 06/16/2020 0827   TRIG  165.0 (H) 04/07/2022 0902   HDL 59.50 04/07/2022 0902   HDL 58 06/16/2020 0827   CHOLHDL 4 04/07/2022 0902   VLDL 33.0 04/07/2022 0902   LDLCALC 117 (H) 04/07/2022 0902   LDLCALC 129 (H) 06/16/2020 0827   LDLCALC 176 (H) 11/05/2019 0844   LDLDIRECT 113.0 10/05/2021 0930     Risk Assessment/Calculations:           Physical Exam:    VS:  There were no vitals taken for this visit.    Wt Readings from Last 3 Encounters:  04/07/22 147 lb (66.7 kg)  01/20/22 147 lb 9.6 oz (67 kg)  12/28/21 147 lb (66.7 kg)     GEN: Well nourished, well  developed in no acute distress HEENT: Normal NECK: No JVD; No carotid bruits CARDIAC: RRR, 1/6 systolic murmur, no rubs or gallops RESPIRATORY:  Clear to auscultation without rales, wheezing or rhonchi  ABDOMEN: Soft, non-tender, non-distended MUSCULOSKELETAL: Trace LE edema to mid-shin, No deformity.  SKIN: Warm and dry NEUROLOGIC:  Alert and oriented x 3 PSYCHIATRIC:  Normal affect   ASSESSMENT:    No diagnosis found.   PLAN:    In order of problems listed above:   #Dyspnea on Exertion #Chest Pain: #CAD: Cardiac CT 2019 with mild prox LAD disease. Most recent CT scan of the abdomen shows more extensive, triple vessel calcification. Myoview 05/2021 with no ischemia. TTE with LVEF 60-65%, no significant valve disease. Currently doing well without anginal symptoms.  -Continue crestor '5mg'$  2x/week (unable to tolerate higher doses) -Continue vascepa 1g BID (patient decreased dose due to concern for bleeding) -Continue lisinopril '10mg'$  daily for now; discussed if her SBP<110 at home to go back to '5mg'$  daily -Continue bystolic 2.'5mg'$  daily  #HLD: #Aortic Atherosclerosis: #Statin intolerance: Has failed multiple statins in the past. Now on crestor '5mg'$  twice per week and vascepa at reduced dose.  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 Doubt her swelling is related to lisinopril dosing but may be related to psoriasis cream (?). Discussed the importance of repeating BP at home and if SBP<110, to go back down to '5mg'$  daily on her lisinopril. BP is currently 130s in the office after taking '10mg'$  today. -Continue lisinopril '10mg'$  daily for now; discussed if her SBP<110 at home to go back to '5mg'$  daily -Continue bystolic 2.'5mg'$  daily  #LE Edema: Improved with compression sock therapy.       Follow-up:  6 months.  Medication Adjustments/Labs and Tests Ordered: Current medicines are reviewed at length with the patient today.  Concerns regarding medicines are outlined above.   No orders of the defined types were placed in this encounter.  No orders of the defined types were placed in this encounter.  There are no Patient Instructions on file for this visit.    Signed, Freada Bergeron, MD  04/25/2022 7:30 AM    [place]

## 2022-04-26 ENCOUNTER — Ambulatory Visit: Payer: Medicare Other | Admitting: Cardiology

## 2022-04-26 NOTE — Progress Notes (Unsigned)
Cardiology Office Note:    Date:  04/27/2022   ID:  [name], DOB 28-Dec-1945, MRN [id number]  PCP:  [name], MD   [facility] Providers Cardiologist:  Freada Bergeron, MD {   Referring MD: [name], MD    History of Present Illness:    [name] is a 76 y.o. female with a hx of difficult to control hypertension, hyperlipidemia, mild nonobstructive CAD, PE, aortic atherosclerosis who was previously followed by Dr. [name] who now returns to clinic for follow-up.  Per review of the record, Cardiac CTA 11/2017 with mild nonobstructive CAD. She has had difficulty with lipid control. Intolerant to pravastatin, atorvastatin, pitavastatin. She reports Repatha caused "paralysis". She has concerns about fish oil and bleeding. She was started on crestor '5mg'$  twice per week.  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 Was also complaining of dyspnea on exertion. We obtained a myoview 05/2021 which was negative. TTE showed LVEF 60-65%, mild LVH, normal RV, no significant valve disease.   Was last seen in clinic on 11/08/21 where she was having fluctuating blood pressures. We continued the lisinopril at that time but told her she could cut back on dosing if BP was on the low side.  Today, the patient overall feels well today. Having episodes where she "feels like she is holding her breath but she is not." Symptoms usually triggered with light activity. No associated chest pain, palpitations, lightheadedness, or dizziness. Myoview was negative last year. She thinks this may be related to her lungs vs underlying neuroendocrine tumor and has pulmonary and Hematology follow-up.   Otherwise, compliant with all medications. BP is well controlled today.    Past Medical History:  Diagnosis Date   ALLERGIC RHINITIS    ANEMIA-NOS    Arthritis    ASTHMA    Breast discharge    Carpal tunnel syndrome    COLONIC  POLYPS, HX OF    Coronary artery disease    "mild CAD" noted on 12/05/17 in coronary CT scan   DIABETES MELLITUS, TYPE II    diet controlled   GERD    Hand tingling    HYPERLIPIDEMIA    HYPERTENSION    Neuroendocrine tumor    OSTEOPENIA    PONV (postoperative nausea and vomiting)    " it relaxes my bladder muscles and I have been known to pee all over the place"   Psoriasis    severe, began soriatane 01/2012   Pulmonary embolism ([place])    Rectal fissure    Scoliosis    Wears glasses     Past Surgical History:  Procedure Laterality Date   benign rectal growth  2004   removed by Dr. Zella Richer   BIOPSY  08/09/2018   Procedure: BIOPSY;  Surgeon: Carol Ada, MD;  Location: [place];  Service: Endoscopy;;   BIOPSY  08/22/2018   Procedure: BIOPSY;  Surgeon: Juanita Craver, MD;  Location: [place];  Service: Endoscopy;;   BIOPSY  03/07/2019   Procedure: BIOPSY;  Surgeon: Irving Copas., MD;  Location: [place];  Service: Gastroenterology;;   BIOPSY  08/06/2020   Procedure: BIOPSY;  Surgeon: Irving Copas., MD;  Location: [place];  Service: Gastroenterology;;   BREAST BIOPSY Right    BREAST EXCISIONAL BIOPSY Left    BREAST SURGERY  1988   biopsy   CESAREAN SECTION     COLONOSCOPY N/A 08/22/2018   Procedure: COLONOSCOPY;  Surgeon: Juanita Craver, MD;  Location: [facility] ENDOSCOPY;  Service: Endoscopy;  Laterality: N/A;   ENDOSCOPIC MUCOSAL RESECTION N/A 03/07/2019   Procedure: ENDOSCOPIC MUCOSAL RESECTION;  Surgeon: Rush Landmark Telford Nab., MD;  Location: [place];  Service: Gastroenterology;  Laterality: N/A;   ESOPHAGOGASTRODUODENOSCOPY (EGD) WITH PROPOFOL N/A 08/04/2018   Procedure: ESOPHAGOGASTRODUODENOSCOPY (EGD) WITH PROPOFOL;  Surgeon: Ladene Artist, MD;  Location: [place];  Service: Gastroenterology;  Laterality: N/A;   ESOPHAGOGASTRODUODENOSCOPY (EGD) WITH PROPOFOL N/A 03/07/2019   Procedure: ESOPHAGOGASTRODUODENOSCOPY (EGD) WITH PROPOFOL;   Surgeon: Rush Landmark Telford Nab., MD;  Location: [place];  Service: Gastroenterology;  Laterality: N/A;   ESOPHAGOGASTRODUODENOSCOPY (EGD) WITH PROPOFOL N/A 08/06/2020   Procedure: ESOPHAGOGASTRODUODENOSCOPY (EGD) WITH PROPOFOL;  Surgeon: Rush Landmark Telford Nab., MD;  Location: [place];  Service: Gastroenterology;  Laterality: N/A;   EUS N/A 03/07/2019   Procedure: UPPER ENDOSCOPIC ULTRASOUND (EUS) RADIAL;  Surgeon: Irving Copas., MD;  Location: [place];  Service: Gastroenterology;  Laterality: N/A;   EUS N/A 08/06/2020   Procedure: UPPER ENDOSCOPIC ULTRASOUND (EUS) RADIAL;  Surgeon: Irving Copas., MD;  Location: [place];  Service: Gastroenterology;  Laterality: N/A;   FINE NEEDLE ASPIRATION  03/07/2019   Procedure: FINE NEEDLE ASPIRATION (FNA) LINEAR;  Surgeon: Irving Copas., MD;  Location: [place];  Service: Gastroenterology;;   FINE NEEDLE ASPIRATION  08/06/2020   Procedure: FINE NEEDLE ASPIRATION;  Surgeon: Irving Copas., MD;  Location: [place];  Service: Gastroenterology;;   Otho Darner SIGMOIDOSCOPY N/A 08/04/2018   Procedure: Beryle Quant;  Surgeon: Ladene Artist, MD;  Location: [place];  Service: Gastroenterology;  Laterality: N/A;   FLEXIBLE SIGMOIDOSCOPY N/A 08/09/2018   Procedure: FLEXIBLE SIGMOIDOSCOPY;  Surgeon: Carol Ada, MD;  Location: [place];  Service: Endoscopy;  Laterality: N/A;   HEMOSTASIS CLIP PLACEMENT  03/07/2019   Procedure: HEMOSTASIS CLIP PLACEMENT;  Surgeon: Irving Copas., MD;  Location: [place];  Service: Gastroenterology;;   SUBMUCOSAL LIFTING INJECTION  03/07/2019   Procedure: SUBMUCOSAL LIFTING INJECTION;  Surgeon: Irving Copas., MD;  Location: [place];  Service: Gastroenterology;;   VIDEO BRONCHOSCOPY WITH ENDOBRONCHIAL NAVIGATION N/A 03/14/2018   Procedure: VIDEO BRONCHOSCOPY WITH ENDOBRONCHIAL NAVIGATION;  Surgeon: Collene Gobble, MD;  Location: [facility];  Service: Thoracic;  Laterality: N/A;    Current Medications: Current Meds  Medication Sig   Ascorbic Acid (VITAMIN C GUMMIE PO) Take 282 mg by mouth every evening. Each tablet 141 mg   calcium carbonate (TUMS EX) 750 MG chewable tablet Chew 750 mg by mouth daily.   Cholecalciferol (VITAMIN D3 GUMMIES ADULT PO) Take 2,000 Units by mouth every evening.   clindamycin (CLEOCIN T) 1 % external solution Apply 1 application topically daily as needed (wounds).    clobetasol (TEMOVATE) 0.05 % external solution Apply topically daily.   Coenzyme Q10 (COQ10 GUMMIES ADULT PO) Take 200 mg by mouth in the morning and at bedtime.   CREON 36000-114000 units CPEP capsule Take 36,000 Units by mouth 2 (two) times daily with a meal.   diphenhydrAMINE (BENADRYL) 25 MG tablet Take 25 mg by mouth at bedtime.   ELIQUIS 5 MG TABS tablet TAKE 1 TABLET BY MOUTH TWICE A DAY   FINACEA 15 % gel Apply topically 2 (two) times daily.   folic acid (FOLVITE) 1 MG tablet TAKE 1 TABLET BY MOUTH EVERY DAY   glucose blood (ONETOUCH ULTRA) test strip TEST DAILY AND AS NEEDED   halobetasol (ULTRAVATE) 0.05 % cream APPLY TO AFFECTED AREA TWICE A DAY  lisinopril (ZESTRIL) 10 MG tablet TAKE 1 TABLET BY MOUTH EVERY DAY   Multiple Vitamins-Minerals (ADULT GUMMY PO) Take 1 tablet by mouth every evening. Children [employer] gummie formula   omeprazole (PRILOSEC OTC) 20 MG tablet Take 20 mg by mouth daily.   ondansetron (ZOFRAN-ODT) 8 MG disintegrating tablet Take 1 tablet (8 mg total) by mouth every 8 (eight) hours as needed for nausea.   oxymetazoline (NASAL RELIEF) 0.05 % nasal spray Place into the nose.   polyethylene glycol (MIRALAX / GLYCOLAX) 17 g packet Take 17 g by mouth every other day.   rosuvastatin (CRESTOR) 5 MG tablet Take 1 tablet (5 mg total) by mouth 2 (two) times a week. Sometimes 3 times a week   Simethicone (GAS-X PO) Take 250 mg by mouth daily as needed.   vitamin B-12 (CYANOCOBALAMIN) 1000 MCG tablet Take  1,000 mcg by mouth daily.   VTAMA 1 % CREA Apply topically.   [DISCONTINUED] icosapent Ethyl (VASCEPA) 1 g capsule Take 1 capsule (1 g total) by mouth 2 (two) times daily.     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  Has one child and one grandchild.  Retired Careers adviser.  Education: college.    Social Determinants of Health   Financial Resource Strain: Low Risk  (10/04/2021)   Overall Financial Resource Strain (CARDIA)    Difficulty of Paying Living Expenses: Not hard at all  Food Insecurity: No Food Insecurity (10/04/2021)   Hunger Vital Sign    Worried About Running Out of Food in the Last Year: Never true    Ran Out of Food in the Last Year: Never true  Transportation Needs: No Transportation Needs (10/04/2021)   PRAPARE - Hydrologist (Medical): No    Lack of Transportation (Non-Medical): No  Physical Activity: Insufficiently Active (10/04/2021)   Exercise Vital Sign    Days of Exercise per Week: 3 days    Minutes of Exercise per Session: 30 min   Stress: No Stress Concern Present (10/04/2021)   [place]    Feeling of Stress : Not at all  Social Connections: Socially Isolated (10/04/2021)   Social Connection and Isolation Panel [NHANES]    Frequency of Communication with Friends and Family: Three times a week    Frequency of Social Gatherings with Friends and Family: Three times a week    Attends Religious Services: Never    Active Member of  Clubs or Organizations: No    Attends Archivist Meetings: Never    Marital Status: Widowed     Family History: The patient's family history includes Arthritis in an other family member; Asthma in her mother and another family member; Breast cancer in an other family member; Breast cancer (age of onset: 30) in her maternal aunt; Heart disease in her mother and another family member; Hypertension in an other family member; Lung cancer in her father; Parkinson's disease in her maternal grandmother; Rheumatic fever in her maternal grandfather.  ROS:   Please see the history of present illness.    Review of Systems  Constitutional:  Negative for chills and fever.  HENT:  Negative for congestion and sore throat.   Eyes:  Negative for blurred vision and double vision.  Respiratory:  Negative for cough.   Cardiovascular:  Positive for leg swelling (Bilateral feet). Negative for chest pain, palpitations, orthopnea, claudication and PND.  Gastrointestinal:  Positive for abdominal pain. Negative for heartburn and nausea.  Genitourinary:  Negative for dysuria and urgency.  Musculoskeletal:  Positive for joint pain and myalgias.  Skin:  Negative for itching and rash.  Neurological:  Negative for loss of consciousness.  Endo/Heme/Allergies:  Does not bruise/bleed easily.  Psychiatric/Behavioral:  The patient is not nervous/anxious and does not have insomnia.    All other systems reviewed and are negative.  EKGs/Labs/Other  Studies Reviewed:    The following studies were reviewed today:  Myoview 05/2021:   The study is normal. The study is low risk.   No ST deviation was noted.   Left ventricular function is normal. Nuclear stress EF: 85 %. The left ventricular ejection fraction is hyperdynamic (>65%). End diastolic cavity size is normal.   Prior study available for comparison from 01/05/2016.  TTE 05/12/21: IMPRESSIONS   1. Left ventricular ejection fraction, by estimation, is 60 to 65%. The  left ventricle has normal function. The left ventricle has no regional  wall motion abnormalities. There is mild concentric left ventricular  hypertrophy. Left ventricular diastolic  parameters were normal.   2. Right ventricular systolic function is normal. The right ventricular  size is normal.   3. The mitral valve is normal in structure. No evidence of mitral valve  regurgitation. No evidence of mitral stenosis.   4. The aortic valve is normal in structure. Aortic valve regurgitation is  not visualized. No aortic stenosis is present.   5. The inferior vena cava is normal in size with greater than 50%  respiratory variability, suggesting right atrial pressure of 3 mmHg.   CT Chest 02/12/21 IMPRESSION: 1. 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 2. Aortic atherosclerosis, in addition to left main and three-vessel coronary artery disease. Assessment for potential risk factor modification, dietary therapy or pharmacologic therapy may be warranted, if clinically indicated. 3. There are calcifications of the aortic valve. Echocardiographic correlation for evaluation of potential valvular dysfunction may be warranted if clinically indicated. 4. Large hiatal hernia. 5. Hepatic steatosis.   Aortic Atherosclerosis (ICD10-I70.0).  CT chest:  12/22/2017 1. In addition to the previously noted mass in the left lower lobe there several other smaller irregular-shaped nodules scattered throughout the lungs bilaterally. The irregular shape of the nodules would be unusual for metastatic disease. The possibility of a benign etiology such as multifocal bronchoceles should be considered, but is a  diagnosis of exclusion. Malignancy should be excluded, and further evaluation with PET-CT is again suggested. 2. Nonocclusive filling defect in the distal left main pulmonary artery compatible with pulmonary embolism. 3. Hepatic steatosis. 4. Aortic atherosclerosis, in addition to left main and left anterior descending coronary artery disease. Assessment for potential risk factor modification, dietary therapy or pharmacologic therapy may be warranted, if clinically indicated. 5. There are calcifications of the aortic valve. Echocardiographic correlation for evaluation of potential valvular dysfunction may be warranted if clinically indicated.   Coronary CTA: 12/05/2017   1. Coronary calcium score of 6. This was 43 percentile for age and sex matched control. 2. Normal coronary origin with right dominance. 3. There is mild CAD in the proximal LAD, aggressive risk factor modification is recommended SOM most probably secondary to obesity and deconditioning.   PET CT: 01/01/2018   1. For the most part the branching nodularity in the lungs is of low activity and likely benign. Given the somewhat branching tubular appearance, the possibility of multifocal bronchocele is certainly raised. The lesion along the left upper lobe paramediastinal area, and in the right upper lobe just adjacent to the fissure do have some low-grade activity (1.6 and 1.7, respectively) which may well be inflammatory but for which surveillance imaging in 6 months time would be suggested. 2. The larger lesion in the left lower lobe is not hypermetabolic and is likely benign.  Bilateral tonsillar activity in the neck is likely benign. 3. No worrisome findings in the neck, abdomen/pelvis, or skeleton.    EKG:  EKG is personally reviewed. 11/08/2021:  Sinus rhythm. Rate 69 bpm. Poor R wave progression.   Recent Labs: 04/07/2022: ALT 31; BUN 20; Creatinine, Ser 1.10; Hemoglobin 12.4; Platelets 317.0; Potassium 4.1; Sodium 139   Recent Lipid Panel    Component Value Date/Time   CHOL 210 (H) 04/07/2022 0902   CHOL 218 (H) 06/16/2020 0827   TRIG 165.0 (H) 04/07/2022 0902   HDL 59.50 04/07/2022 0902   HDL 58 06/16/2020 0827   CHOLHDL 4 04/07/2022 0902   VLDL 33.0 04/07/2022 0902   LDLCALC 117 (H) 04/07/2022 0902   LDLCALC 129 (H) 06/16/2020 0827   LDLCALC 176 (H) 11/05/2019 0844   LDLDIRECT 113.0 10/05/2021 0930     Risk Assessment/Calculations:           Physical Exam:    VS:  BP 134/76   Pulse 91   Ht '5\' 1"'$  (1.549 m)   Wt 146 lb 6.4 oz (66.4 kg)   BMI 27.66 kg/m     Wt Readings from Last 3 Encounters:  04/27/22 146 lb 6.4 oz (66.4 kg)  04/07/22 147 lb (66.7 kg)  01/20/22 147 lb 9.6 oz (67 kg)     GEN: Well nourished, well developed in no acute distress HEENT: Normal NECK: No JVD; No carotid bruits CARDIAC: RRR, 1/6 systolic murmur, no rubs or gallops RESPIRATORY:  Clear to auscultation without rales, wheezing or rhonchi  ABDOMEN: Soft, non-tender, non-distended MUSCULOSKELETAL: Trace LE edema to mid-shin, No deformity.  SKIN: Warm and dry NEUROLOGIC:  Alert and oriented x 3 PSYCHIATRIC:  Normal affect   ASSESSMENT:    1. Coronary artery disease involving native coronary artery of native heart without angina pectoris   2. Essential hypertension   3. History of pulmonary embolus (PE)   4. Aortic atherosclerosis ([place])   5. Lower extremity edema   6. Statin intolerance   7. SOB (shortness of breath)     PLAN:  In order of problems listed above:   #Dyspnea on Exertion #Chest Pain: #CAD: Cardiac CT 2019 with mild prox LAD  disease. Most recent CT scan of the abdomen shows more extensive, triple vessel calcification. Myoview 05/2021 with no ischemia. TTE with LVEF 60-65%, no significant valve disease. Currently doing well without anginal symptoms.  -Continue crestor '5mg'$  2x/week (unable to tolerate higher doses) -Continue vascepa 1g BID (patient decreased dose due to concern for bleeding) -Continue lisinopril '10mg'$  daily for now; discussed if her SBP<110 at home to go back to '5mg'$  daily -Off bystolic as made psoriasis worse  #HLD: #Aortic Atherosclerosis: #Statin intolerance: Has failed multiple statins in the past. Now on crestor '5mg'$  twice per week and vascepa at reduced dose.  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 Doubt her swelling is related to lisinopril dosing but may be related to psoriasis cream (?). Discussed the importance of repeating BP at home and if SBP<110, to go back down to '5mg'$  daily on her lisinopril. BP is currently 130s in the office after taking '10mg'$  today. -Continue lisinopril '10mg'$  daily for now; discussed if her SBP<110 at home to go back to '5mg'$  daily -Continue bystolic 2.'5mg'$  daily  #LE Edema: Improved with compression sock therapy.       Follow-up:  6 months.  Medication Adjustments/Labs and Tests Ordered: Current medicines are reviewed at length with the patient today.  Concerns regarding medicines are outlined above.   No orders of the defined types were placed in this encounter.  No orders of the defined types were placed in this encounter.  There are no Patient Instructions on file for this visit.    Signed, Freada Bergeron, MD  04/27/2022 8:24 AM    [place]

## 2022-04-27 ENCOUNTER — Encounter (HOSPITAL_BASED_OUTPATIENT_CLINIC_OR_DEPARTMENT_OTHER): Payer: Self-pay | Admitting: Cardiology

## 2022-04-27 ENCOUNTER — Ambulatory Visit (INDEPENDENT_AMBULATORY_CARE_PROVIDER_SITE_OTHER): Payer: Medicare Other | Admitting: Cardiology

## 2022-04-27 VITALS — BP 134/76 | HR 91 | Ht 61.0 in | Wt 146.4 lb

## 2022-04-27 DIAGNOSIS — I10 Essential (primary) hypertension: Secondary | ICD-10-CM

## 2022-04-27 DIAGNOSIS — Z86711 Personal history of pulmonary embolism: Secondary | ICD-10-CM

## 2022-04-27 DIAGNOSIS — R0602 Shortness of breath: Secondary | ICD-10-CM

## 2022-04-27 DIAGNOSIS — I700 Atherosclerosis of aorta: Secondary | ICD-10-CM

## 2022-04-27 DIAGNOSIS — I2510 Atherosclerotic heart disease of native coronary artery without angina pectoris: Secondary | ICD-10-CM | POA: Diagnosis not present

## 2022-04-27 DIAGNOSIS — R600 Localized edema: Secondary | ICD-10-CM

## 2022-04-27 DIAGNOSIS — Z789 Other specified health status: Secondary | ICD-10-CM

## 2022-04-27 MED ORDER — VASCEPA 1 G PO CAPS: 2.0000 g | ORAL_CAPSULE | Freq: Two times a day (BID) | ORAL | 3 refills | Status: DC

## 2022-04-27 NOTE — Patient Instructions (Signed)
Medication Instructions:  Your physician recommends that you continue on your current medications as directed. Please refer to the Current Medication list given to you today.  *If you need a refill on your cardiac medications before your next appointment, please call your pharmacy*  Follow-Up: At [facility], you and your health needs are our priority.  As part of our continuing mission to provide you with exceptional heart care, we have created designated Provider Care Teams.  These Care Teams include your primary Cardiologist (physician) and Advanced Practice Providers (APPs -  Physician Assistants and Nurse Practitioners) who all work together to provide you with the care you need, when you need it.  We recommend signing up for the patient portal called "MyChart".  Sign up information is provided on this After Visit Summary.  MyChart is used to connect with patients for Virtual Visits (Telemedicine).  Patients are able to view lab/test results, encounter notes, upcoming appointments, etc.  Non-urgent messages can be sent to your provider as well.   To learn more about what you can do with MyChart, go to [email or web address].    Your next appointment:   6 month(s)  Provider:   Gwyndolyn Kaufman, MD

## 2022-05-06 ENCOUNTER — Other Ambulatory Visit: Payer: Self-pay | Admitting: Internal Medicine

## 2022-05-09 ENCOUNTER — Other Ambulatory Visit: Payer: Self-pay | Admitting: *Deleted

## 2022-05-09 MED ORDER — ROSUVASTATIN CALCIUM 5 MG PO TABS: 5.0000 mg | ORAL_TABLET | ORAL | 3 refills | Status: DC

## 2022-06-16 ENCOUNTER — Encounter: Payer: Self-pay | Admitting: Internal Medicine

## 2022-06-16 NOTE — Progress Notes (Signed)
Subjective:    Patient ID: [name], female    DOB: [birth year], 77 y.o.   MRN: [id number]      HPI [name] is here for  Chief Complaint  Patient presents with   Foot Pain    Right big toe     Right big toe pain -  pain started Tuesday of last week - she woke up with it.  No obvious injuries.  At one point it was red and is still a little swollen.  Pain is in MTP joint.  Advil and prednisone has helped.  Took 5 mg of old prednisone she had at home, 1/2 advil once a day.  She really cannot take either one of them-prednisone is better her stomach and her neuroendocrine tumor and Advil is better her stomach and kidney function.  Symptoms are not improving.    Medications and allergies reviewed with patient and updated if appropriate.  Current Outpatient Medications on File Prior to Visit  Medication Sig Dispense Refill   Ascorbic Acid (VITAMIN C GUMMIE PO) Take 282 mg by mouth every evening. Each tablet 141 mg     calcium carbonate (TUMS EX) 750 MG chewable tablet Chew 750 mg by mouth daily.     Cholecalciferol (VITAMIN D3 GUMMIES ADULT PO) Take 2,000 Units by mouth every evening.     clindamycin (CLEOCIN T) 1 % external solution Apply 1 application topically daily as needed (wounds).      clobetasol (TEMOVATE) 0.05 % external solution Apply topically daily.     Coenzyme Q10 (COQ10 GUMMIES ADULT PO) Take 200 mg by mouth in the morning and at bedtime.     CREON 36000-114000 units CPEP capsule Take 36,000 Units by mouth 2 (two) times daily with a meal.     diphenhydrAMINE (BENADRYL) 25 MG tablet Take 25 mg by mouth at bedtime.     ELIQUIS 5 MG TABS tablet TAKE 1 TABLET BY MOUTH TWICE A DAY 180 tablet 2   FINACEA 15 % gel Apply topically 2 (two) times daily.     folic acid (FOLVITE) 1 MG tablet TAKE 1 TABLET BY MOUTH EVERY DAY 30 tablet 5   glucose blood (ONETOUCH ULTRA) test strip TEST DAILY AND AS NEEDED 300 strip 1   halobetasol (ULTRAVATE) 0.05 % cream APPLY TO  AFFECTED AREA TWICE A DAY     lisinopril (ZESTRIL) 10 MG tablet TAKE 1 TABLET BY MOUTH EVERY DAY 30 tablet 5   Multiple Vitamins-Minerals (ADULT GUMMY PO) Take 1 tablet by mouth every evening. Children [employer] gummie formula     omeprazole (PRILOSEC OTC) 20 MG tablet Take 20 mg by mouth daily.     ondansetron (ZOFRAN-ODT) 8 MG disintegrating tablet Take 1 tablet (8 mg total) by mouth every 8 (eight) hours as needed for nausea. 60 tablet 2   oxymetazoline (NASAL RELIEF) 0.05 % nasal spray Place into the nose.     polyethylene glycol (MIRALAX / GLYCOLAX) 17 g packet Take 17 g by mouth every other day.     rosuvastatin (CRESTOR) 5 MG tablet Take 1 tablet (5 mg total) by mouth 2 (two) times a week. Sometimes 3 times a week 45 tablet 3   Simethicone (GAS-X PO) Take 250 mg by mouth daily as needed.     VASCEPA 1 g capsule Take 2 capsules (2 g total) by mouth 2 (two) times daily. 360 capsule 3   vitamin B-12 (CYANOCOBALAMIN) 1000 MCG tablet Take 1,000 mcg by mouth daily.  VTAMA 1 % CREA Apply topically.     No current facility-administered medications on file prior to visit.    Review of Systems  Constitutional:  Negative for fever.       Objective:   Vitals:   06/17/22 0816 06/17/22 0858  BP: (!) 148/90 (!) 142/84  Pulse: 83   Temp: 98.4 F (36.9 C)   SpO2: 95%    BP Readings from Last 3 Encounters:  06/17/22 (!) 148/90  06/17/22 (!) 142/84  04/27/22 134/76   Wt Readings from Last 3 Encounters:  06/17/22 154 lb (69.9 kg)  06/17/22 154 lb (69.9 kg)  04/27/22 146 lb 6.4 oz (66.4 kg)   Body mass index is 29.1 kg/m.    Physical Exam Constitutional:      General: She is not in acute distress.    Appearance: Normal appearance. She is not ill-appearing.  HENT:     Head: Normocephalic and atraumatic.  Musculoskeletal:        General: Swelling (mild R big toe) and tenderness (right first MTP joint) present.  Skin:    General: Skin is warm and dry.     Findings: Erythema  (all toes are erythematous - worse at big toe - erythema is chronic) present.  Neurological:     Mental Status: She is alert.            CrCl cannot be calculated (Patient's most recent lab result is older than the maximum 21 days allowed.).    Assessment & Plan:    See Problem List for Assessment and Plan of chronic medical problems.

## 2022-06-16 NOTE — Patient Instructions (Addendum)
Have an xray today downstairs.     Medications changes include :   prednisone if needed     A referral was ordered for sports med.     Someone will call you to schedule an appointment.    Return if symptoms worsen or fail to improve.    Gout  Gout is a condition that causes painful swelling of the joints. Gout is a type of inflammation of the joints (arthritis). This condition is caused by having too much uric acid in the body. Uric acid is a chemical that forms when the body breaks down substances called purines. Purines are important for building body proteins. When the body has too much uric acid, sharp crystals can form and build up inside the joints. This causes pain and swelling. Gout attacks can happen quickly and may be very painful (acute gout). Over time, the attacks can affect more joints and become more frequent (chronic gout). Gout can also cause uric acid to build up under the skin and inside the kidneys. What are the causes? This condition is caused by too much uric acid in your blood. This can happen because: Your kidneys do not remove enough uric acid from your blood. This is the most common cause. Your body makes too much uric acid. This can happen with some cancers and cancer treatments. It can also occur if your body is breaking down too many red blood cells (hemolytic anemia). You eat too many foods that are high in purines. These foods include organ meats and some seafood. Alcohol, especially beer, is also high in purines. A gout attack may be triggered by trauma or stress. What increases the risk? The following factors may make you more likely to develop this condition: Having a family history of gout. Being female and middle-aged. Being female and having gone through menopause. Taking certain medicines, including aspirin, cyclosporine, diuretics, levodopa, and niacin. Having an organ transplant. Having certain conditions, such as: Being obese. Lead  poisoning. Kidney disease. A skin condition called psoriasis. Other factors include: Losing weight too quickly. Being dehydrated. Frequently drinking alcohol, especially beer. Frequently drinking beverages that are sweetened with a type of sugar called fructose. What are the signs or symptoms? An attack of acute gout happens quickly. It usually occurs in just one joint. The most common place is the big toe. Attacks often start at night. Other joints that may be affected include joints of the feet, ankle, knee, fingers, wrist, or elbow. Symptoms of this condition may include: Severe pain. Warmth. Swelling. Stiffness. Tenderness. The affected joint may be very painful to touch. Shiny, red, or purple skin. Chills and fever. Chronic gout may cause symptoms more frequently. More joints may be involved. You may also have white or yellow lumps (tophi) on your hands or feet or in other areas near your joints. How is this diagnosed? This condition is diagnosed based on your symptoms, your medical history, and a physical exam. You may have tests, such as: Blood tests to measure uric acid levels. Removal of joint fluid with a thin needle (aspiration) to look for uric acid crystals. X-rays to look for joint damage. How is this treated? Treatment for this condition has two phases: treating an acute attack and preventing future attacks. Acute gout treatment may include medicines to reduce pain and swelling, including: NSAIDs, such as ibuprofen. Steroids. These are strong anti-inflammatory medicines that can be taken by mouth (orally) or injected into a joint. Colchicine. This medicine relieves  pain and swelling when it is taken soon after an attack. It can be given by mouth or through an IV. Preventive treatment may include: Daily use of smaller doses of NSAIDs or colchicine. Use of a medicine that reduces uric acid levels in your blood, such as allopurinol. Changes to your diet. You may need to see  a dietitian about what to eat and drink to prevent gout. Follow these instructions at home: During a gout attack  If directed, put ice on the affected area. To do this: Put ice in a plastic bag. Place a towel between your skin and the bag. Leave the ice on for 20 minutes, 2-3 times a day. Remove the ice if your skin turns bright red. This is very important. If you cannot feel pain, heat, or cold, you have a greater risk of damage to the area. Raise (elevate) the affected joint above the level of your heart as often as possible. Rest the joint as much as possible. If the affected joint is in your leg, you may be given crutches to use. Follow instructions from your health care provider about eating or drinking restrictions. Avoiding future gout attacks Follow a low-purine diet as told by your dietitian or health care provider. Avoid foods and drinks that are high in purines, including liver, kidney, anchovies, asparagus, herring, mushrooms, mussels, and beer. Maintain a healthy weight or lose weight if you are overweight. If you want to lose weight, talk with your health care provider. Do not lose weight too quickly. Start or maintain an exercise program as told by your health care provider. Eating and drinking Avoid drinking beverages that contain fructose. Drink enough fluids to keep your urine pale yellow. If you drink alcohol: Limit how much you have to: 0-1 drink a day for women who are not pregnant. 0-2 drinks a day for men. Know how much alcohol is in a drink. In the [place] one drink equals one 12 oz bottle of beer (355 mL), one 5 oz glass of wine (148 mL), or one 1 oz glass of hard liquor (44 mL). General instructions Take over-the-counter and prescription medicines only as told by your health care provider. Ask your health care provider if the medicine prescribed to you requires you to avoid driving or using machinery. Return to your normal activities as told by your health care  provider. Ask your health care provider what activities are safe for you. Keep all follow-up visits. This is important. Where to find more information [employer]: www.niams.http://www.myers.net/ Contact a health care provider if you have: Another gout attack. Continuing symptoms of a gout attack after 10 days of treatment. Side effects from your medicines. Chills or a fever. Burning pain when you urinate. Pain in your lower back or abdomen. Get help right away if you: Have severe or uncontrolled pain. Cannot urinate. Summary Gout is painful swelling of the joints caused by having too much uric acid in the body. The most common site for gout to occur is in the big toe, but it can affect other joints in the body. Medicines and dietary changes can help to prevent and treat gout attacks. This information is not intended to replace advice given to you by your health care provider. Make sure you discuss any questions you have with your health care provider. Document Revised: 11/04/2020 Document Reviewed: 11/04/2020 Elsevier Patient Education  2023 [employer].

## 2022-06-17 ENCOUNTER — Ambulatory Visit (INDEPENDENT_AMBULATORY_CARE_PROVIDER_SITE_OTHER): Payer: Medicare Other

## 2022-06-17 ENCOUNTER — Other Ambulatory Visit: Payer: Self-pay

## 2022-06-17 ENCOUNTER — Ambulatory Visit (INDEPENDENT_AMBULATORY_CARE_PROVIDER_SITE_OTHER): Payer: Medicare Other | Admitting: Internal Medicine

## 2022-06-17 ENCOUNTER — Ambulatory Visit (INDEPENDENT_AMBULATORY_CARE_PROVIDER_SITE_OTHER): Payer: Medicare Other | Admitting: Family Medicine

## 2022-06-17 VITALS — BP 142/84 | HR 83 | Temp 98.4°F | Ht 61.0 in | Wt 154.0 lb

## 2022-06-17 VITALS — BP 148/90 | HR 83 | Ht 61.0 in | Wt 154.0 lb

## 2022-06-17 DIAGNOSIS — I10 Essential (primary) hypertension: Secondary | ICD-10-CM

## 2022-06-17 DIAGNOSIS — M10371 Gout due to renal impairment, right ankle and foot: Secondary | ICD-10-CM | POA: Diagnosis not present

## 2022-06-17 DIAGNOSIS — M109 Gout, unspecified: Secondary | ICD-10-CM

## 2022-06-17 LAB — BASIC METABOLIC PANEL
BUN: 17 mg/dL (ref 6–23)
CO2: 32 mEq/L (ref 19–32)
Calcium: 9.6 mg/dL (ref 8.4–10.5)
Chloride: 100 mEq/L (ref 96–112)
Creatinine, Ser: 1.08 mg/dL (ref 0.40–1.20)
GFR: 49.15 mL/min — ABNORMAL LOW (ref >60.00)
Glucose, Bld: 106 mg/dL — ABNORMAL HIGH (ref 70–99)
Potassium: 4.8 mEq/L (ref 3.5–5.1)
Sodium: 140 mEq/L (ref 135–145)

## 2022-06-17 LAB — URIC ACID: Uric Acid, Serum: 6.8 mg/dL (ref 2.4–7.0)

## 2022-06-17 MED ORDER — COLCHICINE 0.6 MG PO TABS: 0.3000 mg | ORAL_TABLET | Freq: Every day | ORAL | 2 refills | Status: DC | PRN

## 2022-06-17 MED ORDER — PREDNISONE 10 MG PO TABS: ORAL_TABLET | ORAL | 0 refills | Status: DC

## 2022-06-17 NOTE — Assessment & Plan Note (Signed)
Acute Symptoms and exam consistent with acute gout right big toe MTP joint At this point I do not think colchicine will be effective since it has been going on for over a week Our choice is between a steroid injection, oral prednisone or oral NSAIDs-none of which are great options for her given her medical problems She will see Dr. Denyse Amass today for steroid injection and hopefully that will help She is anxious about if this does not help-I will give her a written prescription for prednisone to take if needed even injection does not help, but I do think it will Prednisone 20 mg daily x 3 days then 10 mg daily x 3 days This is her first gout attack so we will hold off on preventative measures unless this recurs X-ray today to rule out other causes-doubt this is her psoriasis, osteoarthritis or injury given history

## 2022-06-17 NOTE — Progress Notes (Signed)
I, Stevenson Clinch, CMA acting as a Neurosurgeon for Clementeen Graham, MD.  Subjective:    CC: R foot pain  HPI: Pt is a 77 y/o female c/o R foot pain x 2 weeks. Pt locates pain to medial aspect of the foot and great toe. Will occasionally have pain at the lateral and posterior aspects of the knee. Notes some warmth, swelling, and erythema over the past week. Stopped eating most meats 2-3 months ago. Does not drink alcohol. No injury. Has neuro-endocrine turmor.  R foot swelling: yes Aggravates: WB, ambulation Treatments tried: NSAIDs  Pertinent review of Systems: No fevers or chills  Relevant historical information: Neuroendocrine tumor as noted above.  Very sensitive stomach. Psoriasis CKD  Objective:    Vitals:   06/17/22 0908  BP: (!) 148/90  Pulse: 83  SpO2: 95%   General: Well Developed, well nourished, and in no acute distress.   MSK: Right great toe erythematous and tender to palpation.  Decreased range of motion.  Lab and Radiology Results  Procedure: Real-time Ultrasound Guided Injection of right first MTP Device: Philips Affiniti 50G Images permanently stored and available for review in PACS Verbal informed consent obtained.  Discussed risks and benefits of procedure. Warned about infection, bleeding, hyperglycemia damage to structures among others. Patient expresses understanding and agreement Time-out conducted.   Noted no overlying erythema, induration, or other signs of local infection.   Skin prepped in a sterile fashion.   Local anesthesia: Topical Ethyl chloride.   With sterile technique and under real time ultrasound guidance: 40 mg of Kenalog and 1 mL lidocaine injected into first MTP. Fluid seen entering the MTP joint.   Completed without difficulty   Pain immediately resolved suggesting accurate placement of the medication.   Advised to call if fevers/chills, erythema, induration, drainage, or persistent bleeding.   Images permanently stored and  available for review in the ultrasound unit.  Impression: Technically successful ultrasound guided injection.    X-ray images right great toe obtained today personally independently interpreted.  Mild degenerative changes first MTP.  No acute fractures are visible. Await formal radiology review    Impression and Recommendations:    Assessment and Plan: 77 y.o. female with right great toe pain thought to be due to gout.  Will check uric acid and metabolic panel today especially given her history of CKD.  Will go ahead and prescribe low-dose colchicine at 0.3 mg daily as needed.  Anticipate starting allopurinol or Uloric based on uric acid levels.  For now plan for steroid injection and reduce weightbearing or mobilization using a rollator walker or cam walker boot.. We talked about opiate pain medications and she declined.  Recheck in 2 to 3 weeks.  PDMP not reviewed this encounter. Orders Placed This Encounter  Procedures   [place] LIMITED JOINT SPACE STRUCTURES LOW RIGHT(NO LINKED CHARGES)    Order Specific Question:   Reason for Exam (SYMPTOM  OR DIAGNOSIS REQUIRED)    Answer:   right great toe pain    Order Specific Question:   Preferred imaging location?    Answer:   [facility]   Uric acid    Standing Status:   Future    Number of Occurrences:   1    Standing Expiration Date:   06/17/2023   Basic metabolic panel    Standing Status:   Future    Number of Occurrences:   1    Standing Expiration Date:   06/17/2023   Meds ordered this encounter  Medications   colchicine 0.6 MG tablet    Sig: Take 0.5 tablets (0.3 mg total) by mouth daily as needed (gout or psuedogout pain).    Dispense:  30 tablet    Refill:  2    Discussed warning signs or symptoms. Please see discharge instructions. Patient expresses understanding.   The above documentation has been reviewed and is accurate and complete Clementeen Graham, M.D.

## 2022-06-17 NOTE — Patient Instructions (Addendum)
Thank you for coming in today.   You received an injection today. Seek immediate medical attention if the joint becomes red, extremely painful, or is oozing fluid.   Please get labs today before you leave   Check back in 2-3 weeks

## 2022-06-17 NOTE — Assessment & Plan Note (Signed)
Chronic Blood pressure elevated here as usual-typically better controlled at home Continue to monitor at home No change in medication Continue lisinopril 10 mg daily

## 2022-06-20 NOTE — Progress Notes (Signed)
Kidney function is stable from 2 months ago.  Uric acid is a little elevated at 6.8.  Will talk about starting treatment for uric acid at your recheck on the 20th.

## 2022-06-21 ENCOUNTER — Inpatient Hospital Stay: Payer: Medicare Other | Attending: Internal Medicine

## 2022-06-21 ENCOUNTER — Other Ambulatory Visit: Payer: Self-pay

## 2022-06-21 DIAGNOSIS — K5900 Constipation, unspecified: Secondary | ICD-10-CM | POA: Insufficient documentation

## 2022-06-21 DIAGNOSIS — R933 Abnormal findings on diagnostic imaging of other parts of digestive tract: Secondary | ICD-10-CM | POA: Insufficient documentation

## 2022-06-21 DIAGNOSIS — R918 Other nonspecific abnormal finding of lung field: Secondary | ICD-10-CM | POA: Insufficient documentation

## 2022-06-21 DIAGNOSIS — C7A010 Malignant carcinoid tumor of the duodenum: Secondary | ICD-10-CM

## 2022-06-21 DIAGNOSIS — I2699 Other pulmonary embolism without acute cor pulmonale: Secondary | ICD-10-CM | POA: Diagnosis not present

## 2022-06-21 DIAGNOSIS — Z7901 Long term (current) use of anticoagulants: Secondary | ICD-10-CM | POA: Insufficient documentation

## 2022-06-21 LAB — CMP (CANCER CENTER ONLY)
ALT: 32 U/L (ref 0–44)
AST: 29 U/L (ref 15–41)
Albumin: 3.8 g/dL (ref 3.5–5.0)
Alkaline Phosphatase: 71 U/L (ref 38–126)
Anion gap: 7 (ref 5–15)
BUN: 20 mg/dL (ref 8–23)
CO2: 28 mmol/L (ref 22–32)
Calcium: 9.7 mg/dL (ref 8.9–10.3)
Chloride: 102 mmol/L (ref 98–111)
Creatinine: 0.97 mg/dL (ref 0.44–1.00)
GFR, Estimated: 60 mL/min — ABNORMAL LOW (ref >60)
Glucose, Bld: 124 mg/dL — ABNORMAL HIGH (ref 70–99)
Potassium: 4.4 mmol/L (ref 3.5–5.1)
Sodium: 137 mmol/L (ref 135–145)
Total Bilirubin: 0.9 mg/dL (ref 0.3–1.2)
Total Protein: 7.4 g/dL (ref 6.5–8.1)

## 2022-06-21 LAB — CBC WITH DIFFERENTIAL (CANCER CENTER ONLY)
Abs Immature Granulocytes: 0.04 10*3/uL (ref 0.00–0.07)
Basophils Absolute: 0.1 10*3/uL (ref 0.0–0.1)
Basophils Relative: 1 %
Eosinophils Absolute: 0.2 10*3/uL (ref 0.0–0.5)
Eosinophils Relative: 2 %
HCT: 39.5 % (ref 36.0–46.0)
Hemoglobin: 13.1 g/dL (ref 12.0–15.0)
Immature Granulocytes: 0 %
Lymphocytes Relative: 20 %
Lymphs Abs: 2.1 10*3/uL (ref 0.7–4.0)
MCH: 29.8 pg (ref 26.0–34.0)
MCHC: 33.2 g/dL (ref 30.0–36.0)
MCV: 89.8 fL (ref 80.0–100.0)
Monocytes Absolute: 0.7 10*3/uL (ref 0.1–1.0)
Monocytes Relative: 7 %
Neutro Abs: 7.3 10*3/uL (ref 1.7–7.7)
Neutrophils Relative %: 70 %
Platelet Count: 333 10*3/uL (ref 150–400)
RBC: 4.4 MIL/uL (ref 3.87–5.11)
RDW: 15.6 % — ABNORMAL HIGH (ref 11.5–15.5)
WBC Count: 10.5 10*3/uL (ref 4.0–10.5)
nRBC: 0 % (ref 0.0–0.2)

## 2022-06-23 ENCOUNTER — Other Ambulatory Visit: Payer: Self-pay | Admitting: *Deleted

## 2022-06-23 DIAGNOSIS — C7A010 Malignant carcinoid tumor of the duodenum: Secondary | ICD-10-CM

## 2022-06-23 DIAGNOSIS — R933 Abnormal findings on diagnostic imaging of other parts of digestive tract: Secondary | ICD-10-CM | POA: Diagnosis not present

## 2022-06-23 LAB — CHROMOGRANIN A: Chromogranin A (ng/mL): 2722 ng/mL — ABNORMAL HIGH (ref 0.0–101.8)

## 2022-06-26 LAB — 5 HIAA, QUANTITATIVE, URINE, 24 HOUR
5-HIAA, Ur: 4.1 mg/L
5-HIAA,Quant.,24 Hr Urine: 5.4 mg/24 hr (ref 0.0–14.9)
Total Volume: 1325

## 2022-06-28 ENCOUNTER — Other Ambulatory Visit: Payer: Self-pay

## 2022-06-28 ENCOUNTER — Inpatient Hospital Stay (HOSPITAL_BASED_OUTPATIENT_CLINIC_OR_DEPARTMENT_OTHER): Payer: Medicare Other | Admitting: Internal Medicine

## 2022-06-28 VITALS — BP 182/104 | HR 93 | Temp 98.1°F | Resp 16 | Wt 142.4 lb

## 2022-06-28 DIAGNOSIS — C7A019 Malignant carcinoid tumor of the small intestine, unspecified portion: Secondary | ICD-10-CM

## 2022-06-28 DIAGNOSIS — R933 Abnormal findings on diagnostic imaging of other parts of digestive tract: Secondary | ICD-10-CM | POA: Diagnosis not present

## 2022-06-28 NOTE — Progress Notes (Signed)
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  PRIOR THERAPY: None  CURRENT THERAPY: Xarelto 15 mg p.o. twice daily for 3 weeks followed by 20 mg p.o. daily.  INTERVAL HISTORY: [name] 77 y.o. female returns to the clinic today for follow-up visit.  The patient is feeling fine today with no concerning complaints except for intermittent constipation.  She denied having any recent weight loss or night sweats and no diarrhea.  She has no nausea, vomiting, abdominal pain or bleeding issues.  She has no chest pain, shortness of breath, cough or hemoptysis.  She has no headache or visual changes.  She is here today for evaluation with repeat blood work and recommendation regarding her history of carcinoid tumor of the duodenum.  MEDICAL HISTORY: Past Medical History:  Diagnosis Date   ALLERGIC RHINITIS    ANEMIA-NOS    Arthritis    ASTHMA    Breast discharge    Carpal tunnel syndrome    COLONIC POLYPS, HX OF    Coronary artery disease    "mild CAD" noted on 12/05/17 in coronary CT scan   DIABETES MELLITUS, TYPE II    diet controlled   GERD    Hand tingling    HYPERLIPIDEMIA    HYPERTENSION    Neuroendocrine tumor    OSTEOPENIA    PONV (postoperative nausea and vomiting)    " it relaxes my bladder muscles and I have been known to pee all over the place"   Psoriasis    severe, began soriatane 01/2012   Pulmonary embolism (HCC)    Rectal fissure    Scoliosis    Wears glasses     ALLERGIES:  is allergic to phenergan [promethazine hcl], repatha [evolocumab], amlodipine, clarithromycin, codeine, erythromycin, hydralazine hcl, statins, and tetracycline.  MEDICATIONS:   Current Outpatient Medications  Medication Sig Dispense Refill   apixaban (ELIQUIS) 5 MG TABS tablet Take 1 tablet (5 mg total) by mouth 2 (two) times daily. 180 tablet 2   Ascorbic Acid (VITAMIN C GUMMIE PO) Take 282 mg by mouth every evening. Each tablet 141 mg     calcium carbonate (TUMS EX) 750 MG chewable tablet Chew 750 mg by mouth daily.     Cholecalciferol (VITAMIN D3 GUMMIES ADULT PO) Take 2,000 Units by mouth every evening.     clindamycin (CLEOCIN T) 1 % external solution Apply 1 application topically daily as needed (wounds).      clobetasol (TEMOVATE) 0.05 % external solution Apply topically daily.     Coenzyme Q10 (COQ10 GUMMIES ADULT PO) Take 200 mg by mouth in the morning and at bedtime.     COVID-19 mRNA vaccine 2023-2024 (COMIRNATY) syringe Inject into the muscle. 0.3 mL 0   CREON 36000-114000 units CPEP capsule Take 36,000 Units by mouth 2 (two) times daily with a meal.     diphenhydrAMINE (BENADRYL) 25 MG tablet Take 25 mg by mouth at bedtime.     FINACEA 15 % gel Apply topically 2 (two) times daily.     folic acid (FOLVITE) 1 MG tablet TAKE 1 TABLET BY MOUTH EVERY DAY 30 tablet 5   halobetasol (ULTRAVATE) 0.05 % cream APPLY TO AFFECTED AREA TWICE A DAY  icosapent Ethyl (VASCEPA) 1 g capsule Take 1 capsule (1 g total) by mouth 2 (two) times daily. 360 capsule 3   lisinopril (ZESTRIL) 10 MG tablet Take 0.5 tablets (5 mg total) by mouth daily. (Patient taking differently: Take 10 mg by mouth daily.) 30 tablet 5   Multiple Vitamins-Minerals (ADULT GUMMY PO) Take 1 tablet by mouth every evening. Children [employer] gummie formula     nebivolol (BYSTOLIC) 2.5 MG tablet TAKE 1 TABLET BY MOUTH EVERY DAY *BRAND BYSTOLIC* (Patient not taking: Reported on 12/28/2021) 30 tablet 5   omeprazole (PRILOSEC OTC) 20 MG tablet Take 20 mg by mouth daily.     ondansetron (ZOFRAN-ODT) 8 MG disintegrating tablet Take 1 tablet (8 mg total) by mouth every 8 (eight) hours as needed for nausea.  60 tablet 2   ONETOUCH ULTRA test strip USE TEST STRIPS TO CHECK BLOOD SUGAR DAILY AND AS NEEDED. E11.9 300 strip 1   oxymetazoline (AFRIN) 0.05 % nasal spray Place 1 spray into both nostrils 2 (two) times daily.     oxymetazoline (NASAL RELIEF) 0.05 % nasal spray Place into the nose.     polyethylene glycol (MIRALAX / GLYCOLAX) 17 g packet Take 17 g by mouth every other day. (Patient not taking: Reported on 12/28/2021)     rosuvastatin (CRESTOR) 5 MG tablet Take 1 tablet (5 mg total) by mouth 2 (two) times a week. Sometimes 3 times a week 45 tablet 2   Simethicone (GAS-X PO) Take 250 mg by mouth daily as needed.     vitamin B-12 (CYANOCOBALAMIN) 1000 MCG tablet Take 1,000 mcg by mouth daily.     VTAMA 1 % CREA Apply topically. (Patient not taking: Reported on 11/08/2021)     No current facility-administered medications for this visit.    SURGICAL HISTORY:  Past Surgical History:  Procedure Laterality Date   benign rectal growth  2004   removed by Dr. Abbey Chatters   BIOPSY  08/09/2018   Procedure: BIOPSY;  Surgeon: [name], MD;  Location: [facility] ENDOSCOPY;  Service: Endoscopy;;   BIOPSY  08/22/2018   Procedure: BIOPSY;  Surgeon: Charna Elizabeth, MD;  Location: [facility] ENDOSCOPY;  Service: Endoscopy;;   BIOPSY  03/07/2019   Procedure: BIOPSY;  Surgeon: Lemar Lofty., MD;  Location: [facility] ENDOSCOPY;  Service: Gastroenterology;;   BIOPSY  08/06/2020   Procedure: BIOPSY;  Surgeon: Lemar Lofty., MD;  Location: [facility] ENDOSCOPY;  Service: Gastroenterology;;   BREAST BIOPSY Right    BREAST EXCISIONAL BIOPSY Left    BREAST SURGERY  1988   biopsy   CESAREAN SECTION     COLONOSCOPY N/A 08/22/2018   Procedure: COLONOSCOPY;  Surgeon: Charna Elizabeth, MD;  Location: [facility] ENDOSCOPY;  Service: Endoscopy;  Laterality: N/A;   ENDOSCOPIC MUCOSAL RESECTION N/A 03/07/2019   Procedure: ENDOSCOPIC MUCOSAL RESECTION;  Surgeon: Meridee Score Netty Starring., MD;  Location: [facility] ENDOSCOPY;  Service: Gastroenterology;   Laterality: N/A;   ESOPHAGOGASTRODUODENOSCOPY (EGD) WITH PROPOFOL N/A 08/04/2018   Procedure: ESOPHAGOGASTRODUODENOSCOPY (EGD) WITH PROPOFOL;  Surgeon: Meryl Dare, MD;  Location: [facility] ENDOSCOPY;  Service: Gastroenterology;  Laterality: N/A;   ESOPHAGOGASTRODUODENOSCOPY (EGD) WITH PROPOFOL N/A 03/07/2019   Procedure: ESOPHAGOGASTRODUODENOSCOPY (EGD) WITH PROPOFOL;  Surgeon: Meridee Score Netty Starring., MD;  Location: [facility] ENDOSCOPY;  Service: Gastroenterology;  Laterality: N/A;   ESOPHAGOGASTRODUODENOSCOPY (EGD) WITH PROPOFOL N/A 08/06/2020   Procedure: ESOPHAGOGASTRODUODENOSCOPY (EGD) WITH PROPOFOL;  Surgeon: Meridee Score Netty Starring., MD;  Location: [facility] ENDOSCOPY;  Service: Gastroenterology;  Laterality: N/A;   EUS N/A 03/07/2019   Procedure:  UPPER ENDOSCOPIC ULTRASOUND (EUS) RADIAL;  Surgeon: Meridee Score Netty Starring., MD;  Location: [facility] ENDOSCOPY;  Service: Gastroenterology;  Laterality: N/A;   EUS N/A 08/06/2020   Procedure: UPPER ENDOSCOPIC ULTRASOUND (EUS) RADIAL;  Surgeon: Lemar Lofty., MD;  Location: [facility] ENDOSCOPY;  Service: Gastroenterology;  Laterality: N/A;   FINE NEEDLE ASPIRATION  03/07/2019   Procedure: FINE NEEDLE ASPIRATION (FNA) LINEAR;  Surgeon: Lemar Lofty., MD;  Location: [facility] ENDOSCOPY;  Service: Gastroenterology;;   FINE NEEDLE ASPIRATION  08/06/2020   Procedure: FINE NEEDLE ASPIRATION;  Surgeon: Lemar Lofty., MD;  Location: [facility] ENDOSCOPY;  Service: Gastroenterology;;   Wenda Low SIGMOIDOSCOPY N/A 08/04/2018   Procedure: Arnell Sieving;  Surgeon: Meryl Dare, MD;  Location: [facility] ENDOSCOPY;  Service: Gastroenterology;  Laterality: N/A;   FLEXIBLE SIGMOIDOSCOPY N/A 08/09/2018   Procedure: FLEXIBLE SIGMOIDOSCOPY;  Surgeon: [name], MD;  Location: [facility] ENDOSCOPY;  Service: Endoscopy;  Laterality: N/A;   HEMOSTASIS CLIP PLACEMENT  03/07/2019   Procedure: HEMOSTASIS CLIP PLACEMENT;  Surgeon: Meridee Score Netty Starring., MD;  Location: [facility] ENDOSCOPY;  Service:  Gastroenterology;;   SUBMUCOSAL LIFTING INJECTION  03/07/2019   Procedure: SUBMUCOSAL LIFTING INJECTION;  Surgeon: Lemar Lofty., MD;  Location: [facility] ENDOSCOPY;  Service: Gastroenterology;;   VIDEO BRONCHOSCOPY WITH ENDOBRONCHIAL NAVIGATION N/A 03/14/2018   Procedure: VIDEO BRONCHOSCOPY WITH ENDOBRONCHIAL NAVIGATION;  Surgeon: Leslye Peer, MD;  Location: [facility] OR;  Service: Thoracic;  Laterality: N/A;    REVIEW OF SYSTEMS:  Constitutional: positive for fatigue Eyes: negative Ears, nose, mouth, throat, and face: negative Respiratory: negative Cardiovascular: negative Gastrointestinal: positive for constipation Genitourinary:negative Integument/breast: negative Hematologic/lymphatic: negative Musculoskeletal:negative Neurological: negative Behavioral/Psych: negative Endocrine: negative Allergic/Immunologic: negative   PHYSICAL EXAMINATION: General appearance: alert, cooperative, fatigued, and no distress Head: Normocephalic, without obvious abnormality, atraumatic Neck: no adenopathy, no JVD, supple, symmetrical, trachea midline, and thyroid not enlarged, symmetric, no tenderness/mass/nodules Lymph nodes: Cervical, supraclavicular, and axillary nodes normal. Resp: clear to auscultation bilaterally Back: symmetric, no curvature. ROM normal. No CVA tenderness. Cardio: regular rate and rhythm, S1, S2 normal, no murmur, click, rub or gallop GI: soft, non-tender; bowel sounds normal; no masses,  no organomegaly Extremities: extremities normal, atraumatic, no cyanosis or edema Neurologic: Alert and oriented X 3, normal strength and tone. Normal symmetric reflexes. Normal coordination and gait  ECOG PERFORMANCE STATUS: 1 - Symptomatic but completely ambulatory  Blood pressure 126/60, pulse (!) 55, temperature 98.7 F (37.1 C), temperature source Oral, resp. rate 16, weight 147 lb (66.7 kg), SpO2 99 %.  LABORATORY DATA: Lab Results  Component Value Date   WBC 6.7 12/21/2021    HGB 12.8 12/21/2021   HCT 38.3 12/21/2021   MCV 92.7 12/21/2021   PLT 300 12/21/2021      Chemistry      Component Value Date/Time   NA 139 12/21/2021 0913   NA 141 09/03/2018 0824   K 4.3 12/21/2021 0913   CL 103 12/21/2021 0913   CO2 29 12/21/2021 0913   BUN 13 12/21/2021 0913   BUN 8 09/03/2018 0824   CREATININE 1.04 (H) 12/21/2021 0913   CREATININE 1.08 (H) 11/05/2019 0844      Component Value Date/Time   CALCIUM 9.6 12/21/2021 0913   ALKPHOS 66 12/21/2021 0913   AST 18 12/21/2021 0913   ALT 21 12/21/2021 0913   BILITOT 0.6 12/21/2021 0913       RADIOGRAPHIC STUDIES: No results found.   ASSESSMENT AND PLAN: This is a very pleasant 77 years old white female with suspicious carcinoid tumor  of the duodenum as well as a small pulmonary nodules.  The patient has no clear pathology confirming her diagnosis but highly suspicious on the imaging studies. She had repeat PET dotatate scan on December 14, 2020 that showed focal radiotracer activity localized in the second portion of the duodenum corresponding to enhancing lesion on the comparison CT and consistent with a well-differentiated small neuroendocrine tumor.  There was no evidence of metastatic adenopathy in the abdomen and pelvis and no liver or mesenteric metastasis.  The patient had new bilateral pulmonary nodularity with discrete nodule in the right lung and peribronchial thickening and small nodularity in the right middle lobe and lingula but no activity associated with these lesions. She is followed by Dr. Delton Coombes from pulmonary medicine. She was seen recently at [facility] by Dr. Reginia Naas as well as a GI surgeon Dr. Derek Mound and both of them recommended for her to continue on observation. The patient is feeling fine today with no concerning complaints except for constipation.  She had repeat blood work the last few days.  It showed significant elevation of the chromogranin A up to 2722.  The 24-hour urine  for 5 HIAA is normal. I had a lengthy discussion with the patient today about her current condition and her concern about possibility of worsening disease. She is interested in proceeding with due to dotatate PET scan to rule out any worsening of her disease or metastasis.  I will order the scan to be done in the next few weeks and the patient will come back for follow-up visit after her return from vacation in around 6 weeks for evaluation and discussion of her PET scan results and further recommendation regarding her condition. For the history of pulmonary nodules, she is followed by Dr. Delton Coombes and she is supposed to have repeat CT scan of the chest early next months. The patient will continue her current treatment with Xarelto for the pulmonary embolism. The patient was advised to call immediately if she has any other concerning symptoms in the interval. The patient voices understanding of current disease status and treatment options and is in agreement with the current care plan.  All questions were answered. The patient knows to call the clinic with any problems, questions or concerns. We can certainly see the patient much sooner if necessary. The total time spent in the appointment was 30 minutes.   Disclaimer: This note was dictated with voice recognition software. Similar sounding words can inadvertently be transcribed and may not be corrected upon review.

## 2022-07-04 ENCOUNTER — Ambulatory Visit (INDEPENDENT_AMBULATORY_CARE_PROVIDER_SITE_OTHER): Payer: Medicare Other | Admitting: Family Medicine

## 2022-07-04 VITALS — BP 152/96 | HR 88 | Ht 61.0 in | Wt 141.0 lb

## 2022-07-04 DIAGNOSIS — M109 Gout, unspecified: Secondary | ICD-10-CM

## 2022-07-04 NOTE — Progress Notes (Signed)
   Rubin Payor, PhD, LAT, ATC acting as a scribe for Clementeen Graham, MD.  [name] is a 77 y.o. female who presents to [facility] today for f/u R foot pain, due to a gout flare. Pt was last seen by Dr. Denyse Amass on 06/17/22 and was given a R 1st MTP steroid injection, advised to reduce weight bearing, and labs were obtained. Today, pt reports R Great toe pain is completely resolved. No pain noted.   Dx imaging: 06/17/22 R Great toe XR & labs  Pertinent review of systems: No fevers or chills  Relevant historical information: Diabetes.  Carcinoid intestinal tumor.  Gout   Exam:  BP (!) 152/96   Pulse 88   Ht 5\' 1"  (1.549 m)   Wt 141 lb (64 kg)   SpO2 98%   BMI 26.64 kg/m  General: Well Developed, well nourished, and in no acute distress.   MSK: Right great toe normal appearing normal motion    Lab and Radiology Results  EXAM: RIGHT GREAT TOE three views   COMPARISON:  None Available.   FINDINGS: There is no evidence of fracture or dislocation. There is no evidence of arthropathy or other focal bone abnormality. Soft tissues are unremarkable.   IMPRESSION: Negative.     Electronically Signed   By: Layla Maw M.D.   On: 06/21/2022 21:27   I, Clementeen Graham, personally (independently) visualized and performed the interpretation of the images attached in this note.  Lab Results  Component Value Date   LABURIC 6.8 06/17/2022       Assessment and Plan: 77 y.o. female with right great toe pain thought to be due to gout.  She had complete resolution of symptoms with steroid injection.  This is her first episode of gout.  Uric acid is mildly elevated at 6.8.  We talked about options.  Plan for watchful waiting for now.  She can restart colchicine if needed.  If this keeps happening we want to use a uric acid lowering medicine like allopurinol or Uloric.  For now watchful waiting check back as needed. Total encounter time 20  minutes including face-to-face time with the patient and, reviewing past medical record, and charting on the date of service.      PDMP not reviewed this encounter. No orders of the defined types were placed in this encounter.  No orders of the defined types were placed in this encounter.    Discussed warning signs or symptoms. Please see discharge instructions. Patient expresses understanding.   The above documentation has been reviewed and is accurate and complete Clementeen Graham, M.D.

## 2022-07-04 NOTE — Patient Instructions (Signed)
Thank you for coming in today.   We can do more if we need to for gout.   Next step would be uric acid lowering medicine like Allopurinol.  Ok to use colchicine again in the future if you need to.   I can see you for other problem along the way if you need to.

## 2022-07-08 ENCOUNTER — Other Ambulatory Visit: Payer: Self-pay | Admitting: Internal Medicine

## 2022-07-12 ENCOUNTER — Telehealth: Payer: Self-pay | Admitting: Emergency Medicine

## 2022-07-15 NOTE — Telephone Encounter (Signed)
Dr. Delton Coombes, please advise on the message from pt. Not wanting to have CT since she is having the PET.

## 2022-07-15 NOTE — Telephone Encounter (Signed)
That sounds fine. We can review

## 2022-07-21 ENCOUNTER — Ambulatory Visit (HOSPITAL_BASED_OUTPATIENT_CLINIC_OR_DEPARTMENT_OTHER): Payer: Medicare Other

## 2022-08-01 ENCOUNTER — Other Ambulatory Visit: Payer: Self-pay | Admitting: *Deleted

## 2022-08-01 DIAGNOSIS — C7A019 Malignant carcinoid tumor of the small intestine, unspecified portion: Secondary | ICD-10-CM

## 2022-08-02 ENCOUNTER — Other Ambulatory Visit: Payer: Self-pay

## 2022-08-02 ENCOUNTER — Inpatient Hospital Stay: Payer: Medicare Other | Attending: Internal Medicine

## 2022-08-02 DIAGNOSIS — C7A019 Malignant carcinoid tumor of the small intestine, unspecified portion: Secondary | ICD-10-CM

## 2022-08-02 DIAGNOSIS — C7A8 Other malignant neuroendocrine tumors: Secondary | ICD-10-CM | POA: Insufficient documentation

## 2022-08-02 DIAGNOSIS — R918 Other nonspecific abnormal finding of lung field: Secondary | ICD-10-CM | POA: Diagnosis not present

## 2022-08-02 LAB — CMP (CANCER CENTER ONLY)
ALT: 21 U/L (ref 0–44)
AST: 22 U/L (ref 15–41)
Albumin: 3.8 g/dL (ref 3.5–5.0)
Alkaline Phosphatase: 60 U/L (ref 38–126)
Anion gap: 6 (ref 5–15)
BUN: 13 mg/dL (ref 8–23)
CO2: 30 mmol/L (ref 22–32)
Calcium: 9.4 mg/dL (ref 8.9–10.3)
Chloride: 103 mmol/L (ref 98–111)
Creatinine: 1.02 mg/dL — ABNORMAL HIGH (ref 0.44–1.00)
GFR, Estimated: 56 mL/min — ABNORMAL LOW (ref >60)
Glucose, Bld: 119 mg/dL — ABNORMAL HIGH (ref 70–99)
Potassium: 4.4 mmol/L (ref 3.5–5.1)
Sodium: 139 mmol/L (ref 135–145)
Total Bilirubin: 0.6 mg/dL (ref 0.3–1.2)
Total Protein: 6.6 g/dL (ref 6.5–8.1)

## 2022-08-02 LAB — CBC WITH DIFFERENTIAL (CANCER CENTER ONLY)
Abs Immature Granulocytes: 0.03 10*3/uL (ref 0.00–0.07)
Basophils Absolute: 0.1 10*3/uL (ref 0.0–0.1)
Basophils Relative: 1 %
Eosinophils Absolute: 0.3 10*3/uL (ref 0.0–0.5)
Eosinophils Relative: 4 %
HCT: 37.9 % (ref 36.0–46.0)
Hemoglobin: 12.2 g/dL (ref 12.0–15.0)
Immature Granulocytes: 0 %
Lymphocytes Relative: 20 %
Lymphs Abs: 1.5 10*3/uL (ref 0.7–4.0)
MCH: 29.7 pg (ref 26.0–34.0)
MCHC: 32.2 g/dL (ref 30.0–36.0)
MCV: 92.2 fL (ref 80.0–100.0)
Monocytes Absolute: 0.6 10*3/uL (ref 0.1–1.0)
Monocytes Relative: 8 %
Neutro Abs: 4.8 10*3/uL (ref 1.7–7.7)
Neutrophils Relative %: 67 %
Platelet Count: 315 10*3/uL (ref 150–400)
RBC: 4.11 MIL/uL (ref 3.87–5.11)
RDW: 15.6 % — ABNORMAL HIGH (ref 11.5–15.5)
WBC Count: 7.3 10*3/uL (ref 4.0–10.5)
nRBC: 0 % (ref 0.0–0.2)

## 2022-08-03 ENCOUNTER — Encounter (HOSPITAL_COMMUNITY)
Admission: RE | Admit: 2022-08-03 | Discharge: 2022-08-03 | Disposition: A | Discharge status: Home / Self Care | Payer: Medicare Other | Source: Ambulatory Visit | Attending: Internal Medicine | Admitting: Internal Medicine

## 2022-08-03 DIAGNOSIS — C7A019 Malignant carcinoid tumor of the small intestine, unspecified portion: Secondary | ICD-10-CM | POA: Insufficient documentation

## 2022-08-03 MED ORDER — COPPER CU 64 DOTATATE 1 MCI/ML IV SOLN
4.0000 | Freq: Once | INTRAVENOUS | Status: AC
  Administered 2022-08-03: 3.93 via INTRAVENOUS

## 2022-08-04 ENCOUNTER — Other Ambulatory Visit: Payer: Self-pay

## 2022-08-04 ENCOUNTER — Inpatient Hospital Stay (HOSPITAL_BASED_OUTPATIENT_CLINIC_OR_DEPARTMENT_OTHER): Payer: Medicare Other | Admitting: Internal Medicine

## 2022-08-04 VITALS — BP 154/87 | HR 80 | Temp 97.5°F | Resp 17 | Ht 61.0 in | Wt 142.1 lb

## 2022-08-04 DIAGNOSIS — D3A8 Other benign neuroendocrine tumors: Secondary | ICD-10-CM | POA: Diagnosis not present

## 2022-08-04 DIAGNOSIS — C7A8 Other malignant neuroendocrine tumors: Secondary | ICD-10-CM | POA: Diagnosis not present

## 2022-08-04 NOTE — Progress Notes (Signed)
[facility] [phone]   [phone]  OFFICE PROGRESS NOTE  Pincus Sanes, MD 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  PRIOR THERAPY: None  CURRENT THERAPY: Xarelto 15 mg p.o. twice daily for 3 weeks followed by 20 mg p.o. daily.  INTERVAL HISTORY: [name] 77 y.o. female returns to the clinic today for follow-up visit.  The patient is feeling fine today with no concerning complaints except for intermittent nausea and she is currently using ondansetron on as-needed basis usually once a day.  She denied having any chest pain, shortness of breath, cough or hemoptysis.  She has no hot flashes.  She denied having any fever or chills.  She has intermittent abdominal aches but no diarrhea or constipation.  She has no recent weight loss or night sweats.  She returned from a vacation to the beach recently.  She had dotatate PET scan performed yesterday and she is here for evaluation and recommendation regarding her condition.  MEDICAL HISTORY: Past Medical History:  Diagnosis Date   ALLERGIC RHINITIS    ANEMIA-NOS    Arthritis    ASTHMA    Breast discharge    Carpal tunnel syndrome    COLONIC POLYPS, HX OF    Coronary artery disease    "mild CAD" noted on 12/05/17 in coronary CT scan   DIABETES MELLITUS, TYPE II    diet controlled   GERD    Hand tingling    HYPERLIPIDEMIA    HYPERTENSION    Neuroendocrine tumor    OSTEOPENIA    PONV (postoperative nausea and vomiting)    " it relaxes my bladder muscles and I have been known to pee all over the place"   Psoriasis    severe, began soriatane 01/2012   Pulmonary embolism (HCC)    Rectal fissure    Scoliosis    Wears glasses     ALLERGIES:  is allergic to phenergan [promethazine hcl], benicar  [olmesartan], repatha [evolocumab], amlodipine, clarithromycin, codeine, erythromycin, hydralazine hcl, statins, and tetracycline.  MEDICATIONS:  Current Outpatient Medications  Medication Sig Dispense Refill   Ascorbic Acid (VITAMIN C GUMMIE PO) Take 282 mg by mouth every evening. Each tablet 141 mg     calcium carbonate (TUMS EX) 750 MG chewable tablet Chew 750 mg by mouth daily.     Cholecalciferol (VITAMIN D3 GUMMIES ADULT PO) Take 2,000 Units by mouth every evening.     clindamycin (CLEOCIN T) 1 % external solution Apply 1 application topically daily as needed (wounds).      clobetasol (TEMOVATE) 0.05 % external solution Apply topically daily.     Coenzyme Q10 (COQ10 GUMMIES ADULT PO) Take 200 mg by mouth in the morning and at bedtime.     colchicine 0.6 MG tablet Take 0.5 tablets (0.3 mg total) by mouth daily as needed (gout or psuedogout pain). 30 tablet 2   CREON 36000-114000 units CPEP capsule Take 36,000 Units by mouth 2 (two) times daily with a meal.     diphenhydrAMINE (BENADRYL) 25 MG tablet Take 25 mg by mouth at bedtime.     ELIQUIS 5 MG TABS tablet TAKE 1 TABLET BY MOUTH TWICE A DAY 180 tablet 2   FINACEA 15 % gel Apply topically 2 (two) times daily.     folic acid (  FOLVITE) 1 MG tablet TAKE 1 TABLET BY MOUTH EVERY DAY 90 tablet 1   glucose blood (ONETOUCH ULTRA) test strip TEST DAILY AND AS NEEDED 300 strip 1   halobetasol (ULTRAVATE) 0.05 % cream APPLY TO AFFECTED AREA TWICE A DAY     lisinopril (ZESTRIL) 10 MG tablet TAKE 1 TABLET BY MOUTH EVERY DAY 90 tablet 1   Multiple Vitamins-Minerals (ADULT GUMMY PO) Take 1 tablet by mouth every evening. Children [employer] gummie formula     omeprazole (PRILOSEC OTC) 20 MG tablet Take 20 mg by mouth daily. Takes 10 mg in am and 10 mg in pm     ondansetron (ZOFRAN-ODT) 8 MG disintegrating tablet Take 1 tablet (8 mg total) by mouth every 8 (eight) hours as needed for nausea. 60 tablet 2   oxymetazoline (NASAL RELIEF) 0.05 % nasal spray  Place into the nose.     polyethylene glycol (MIRALAX / GLYCOLAX) 17 g packet Take 17 g by mouth daily as needed.     rosuvastatin (CRESTOR) 5 MG tablet Take 1 tablet (5 mg total) by mouth 2 (two) times a week. Sometimes 3 times a week 45 tablet 3   Simethicone (GAS-X PO) Take 250 mg by mouth daily as needed.     VASCEPA 1 g capsule Take 2 capsules (2 g total) by mouth 2 (two) times daily. 360 capsule 3   vitamin B-12 (CYANOCOBALAMIN) 1000 MCG tablet Take 1,000 mcg by mouth daily.     No current facility-administered medications for this visit.    SURGICAL HISTORY:  Past Surgical History:  Procedure Laterality Date   benign rectal growth  2004   removed by Dr. Abbey Chatters   BIOPSY  08/09/2018   Procedure: BIOPSY;  Surgeon: [name], MD;  Location: [facility] ENDOSCOPY;  Service: Endoscopy;;   BIOPSY  08/22/2018   Procedure: BIOPSY;  Surgeon: Charna Elizabeth, MD;  Location: [facility] ENDOSCOPY;  Service: Endoscopy;;   BIOPSY  03/07/2019   Procedure: BIOPSY;  Surgeon: Lemar Lofty., MD;  Location: [facility] ENDOSCOPY;  Service: Gastroenterology;;   BIOPSY  08/06/2020   Procedure: BIOPSY;  Surgeon: Lemar Lofty., MD;  Location: [facility] ENDOSCOPY;  Service: Gastroenterology;;   BREAST BIOPSY Right    BREAST EXCISIONAL BIOPSY Left    BREAST SURGERY  1988   biopsy   CESAREAN SECTION     COLONOSCOPY N/A 08/22/2018   Procedure: COLONOSCOPY;  Surgeon: Charna Elizabeth, MD;  Location: [facility] ENDOSCOPY;  Service: Endoscopy;  Laterality: N/A;   ENDOSCOPIC MUCOSAL RESECTION N/A 03/07/2019   Procedure: ENDOSCOPIC MUCOSAL RESECTION;  Surgeon: Meridee Score Netty Starring., MD;  Location: [facility] ENDOSCOPY;  Service: Gastroenterology;  Laterality: N/A;   ESOPHAGOGASTRODUODENOSCOPY (EGD) WITH PROPOFOL N/A 08/04/2018   Procedure: ESOPHAGOGASTRODUODENOSCOPY (EGD) WITH PROPOFOL;  Surgeon: Meryl Dare, MD;  Location: [facility] ENDOSCOPY;  Service: Gastroenterology;  Laterality: N/A;   ESOPHAGOGASTRODUODENOSCOPY (EGD) WITH PROPOFOL N/A  03/07/2019   Procedure: ESOPHAGOGASTRODUODENOSCOPY (EGD) WITH PROPOFOL;  Surgeon: Meridee Score Netty Starring., MD;  Location: [facility] ENDOSCOPY;  Service: Gastroenterology;  Laterality: N/A;   ESOPHAGOGASTRODUODENOSCOPY (EGD) WITH PROPOFOL N/A 08/06/2020   Procedure: ESOPHAGOGASTRODUODENOSCOPY (EGD) WITH PROPOFOL;  Surgeon: Meridee Score Netty Starring., MD;  Location: [facility] ENDOSCOPY;  Service: Gastroenterology;  Laterality: N/A;   EUS N/A 03/07/2019   Procedure: UPPER ENDOSCOPIC ULTRASOUND (EUS) RADIAL;  Surgeon: Lemar Lofty., MD;  Location: [facility] ENDOSCOPY;  Service: Gastroenterology;  Laterality: N/A;   EUS N/A 08/06/2020   Procedure: UPPER ENDOSCOPIC ULTRASOUND (EUS) RADIAL;  Surgeon: Meridee Score Netty Starring., MD;  Location: [facility]  ENDOSCOPY;  Service: Gastroenterology;  Laterality: N/A;   FINE NEEDLE ASPIRATION  03/07/2019   Procedure: FINE NEEDLE ASPIRATION (FNA) LINEAR;  Surgeon: Lemar Lofty., MD;  Location: [facility] ENDOSCOPY;  Service: Gastroenterology;;   FINE NEEDLE ASPIRATION  08/06/2020   Procedure: FINE NEEDLE ASPIRATION;  Surgeon: Lemar Lofty., MD;  Location: [facility] ENDOSCOPY;  Service: Gastroenterology;;   Wenda Low SIGMOIDOSCOPY N/A 08/04/2018   Procedure: Arnell Sieving;  Surgeon: Meryl Dare, MD;  Location: [facility] ENDOSCOPY;  Service: Gastroenterology;  Laterality: N/A;   FLEXIBLE SIGMOIDOSCOPY N/A 08/09/2018   Procedure: FLEXIBLE SIGMOIDOSCOPY;  Surgeon: [name], MD;  Location: [facility] ENDOSCOPY;  Service: Endoscopy;  Laterality: N/A;   HEMOSTASIS CLIP PLACEMENT  03/07/2019   Procedure: HEMOSTASIS CLIP PLACEMENT;  Surgeon: Meridee Score Netty Starring., MD;  Location: [facility] ENDOSCOPY;  Service: Gastroenterology;;   SUBMUCOSAL LIFTING INJECTION  03/07/2019   Procedure: SUBMUCOSAL LIFTING INJECTION;  Surgeon: Lemar Lofty., MD;  Location: [facility] ENDOSCOPY;  Service: Gastroenterology;;   VIDEO BRONCHOSCOPY WITH ENDOBRONCHIAL NAVIGATION N/A 03/14/2018   Procedure: VIDEO BRONCHOSCOPY  WITH ENDOBRONCHIAL NAVIGATION;  Surgeon: Leslye Peer, MD;  Location: [facility] OR;  Service: Thoracic;  Laterality: N/A;    REVIEW OF SYSTEMS:  Constitutional: positive for fatigue Eyes: negative Ears, nose, mouth, throat, and face: negative Respiratory: negative Cardiovascular: negative Gastrointestinal: positive for nausea Genitourinary:negative Integument/breast: negative Hematologic/lymphatic: negative Musculoskeletal:negative Neurological: negative Behavioral/Psych: negative Endocrine: negative Allergic/Immunologic: negative   PHYSICAL EXAMINATION: General appearance: alert, cooperative, fatigued, and no distress Head: Normocephalic, without obvious abnormality, atraumatic Neck: no adenopathy, no JVD, supple, symmetrical, trachea midline, and thyroid not enlarged, symmetric, no tenderness/mass/nodules Lymph nodes: Cervical, supraclavicular, and axillary nodes normal. Resp: clear to auscultation bilaterally Back: symmetric, no curvature. ROM normal. No CVA tenderness. Cardio: regular rate and rhythm, S1, S2 normal, no murmur, click, rub or gallop GI: soft, non-tender; bowel sounds normal; no masses,  no organomegaly Extremities: extremities normal, atraumatic, no cyanosis or edema Neurologic: Alert and oriented X 3, normal strength and tone. Normal symmetric reflexes. Normal coordination and gait  ECOG PERFORMANCE STATUS: 1 - Symptomatic but completely ambulatory  Blood pressure (!) 154/87, pulse 80, temperature (!) 97.5 F (36.4 C), temperature source Temporal, resp. rate 17, height 5\' 1"  (1.549 m), weight 142 lb 1.6 oz (64.5 kg), SpO2 99 %.  LABORATORY DATA: Lab Results  Component Value Date   WBC 7.3 08/02/2022   HGB 12.2 08/02/2022   HCT 37.9 08/02/2022   MCV 92.2 08/02/2022   PLT 315 08/02/2022      Chemistry      Component Value Date/Time   NA 139 08/02/2022 0832   NA 141 09/03/2018 0824   K 4.4 08/02/2022 0832   CL 103 08/02/2022 0832   CO2 30 08/02/2022  0832   BUN 13 08/02/2022 0832   BUN 8 09/03/2018 0824   CREATININE 1.02 (H) 08/02/2022 0832   CREATININE 1.08 (H) 11/05/2019 0844      Component Value Date/Time   CALCIUM 9.4 08/02/2022 0832   ALKPHOS 60 08/02/2022 0832   AST 22 08/02/2022 0832   ALT 21 08/02/2022 0832   BILITOT 0.6 08/02/2022 0832       RADIOGRAPHIC STUDIES: NM PET DOTATATE SKULL BASE TO MID THIGH  Result Date: 08/04/2022 CLINICAL DATA:  Carcinoid tumor of the duodenum EXAM: NUCLEAR MEDICINE PET SKULL BASE TO THIGH TECHNIQUE: 3.93 mCi Cu 64 DOTATATE was injected intravenously. Full-ring PET imaging was performed from the skull base to thigh after the radiotracer. CT data was obtained and used  for attenuation correction and anatomic localization. COMPARISON:  12/14/2020. FINDINGS: NECK No radiotracer activity in neck lymph nodes. Incidental CT findings: None CHEST No radiotracer accumulation within mediastinal or hilar lymph nodes. No suspicious pulmonary nodules on the CT scan. Incidental CT finding:2 persistent nodules within the anteromedial left upper lobe measure up to 5 mm and are unchanged from the comparison exam. No corresponding increased uptake identified on the PET images. These are favored to represent a benign process. Aortic atherosclerosis and coronary artery calcifications. ABDOMEN/PELVIS Previous tracer avid lesion within the second portion of the duodenum is again seen with SUV max of 17.05, image 87 of the fused PET-CT images. On the previous exam this had an SUV max of 19.3. No tracer avid abdominopelvic lymph nodes. No focal areas of increased uptake identified within the liver. Physiologic activity noted in the liver, spleen, adrenal glands and kidneys. Incidental CT findings:Aortic atherosclerosis. Gallstones. Stable, irregular exophytic cyst off the lateral cortex of the right kidney is again seen measuring 1.9 x 1.6 cm without significant tracer uptake. SKELETON No focal activity to suggest skeletal  metastasis. Incidental CT findings:None IMPRESSION: 1. Persistent tracer avid lesion within the second portion of the duodenum is again noted compatible with neuroendocrine tumor. Similar degree of FDG uptake with SUV max equal to 17.05 (formally 19.3). 2. No signs of tracer avid metastatic disease. 3. Stable 5 mm left upper lobe lung nodules without significant tracer uptake. These are favored to represent a benign process. 4. Gallstones. 5.  Aortic Atherosclerosis (ICD10-I70.0). Electronically Signed   By: Signa Kell M.D.   On: 08/04/2022 09:29     ASSESSMENT AND PLAN: This is a very pleasant 77 years old white female with suspicious carcinoid tumor of the duodenum as well as a small pulmonary nodules.  The patient has no clear pathology confirming her diagnosis but highly suspicious on the imaging studies. She had repeat PET dotatate scan on December 14, 2020 that showed focal radiotracer activity localized in the second portion of the duodenum corresponding to enhancing lesion on the comparison CT and consistent with a well-differentiated small neuroendocrine tumor.  There was no evidence of metastatic adenopathy in the abdomen and pelvis and no liver or mesenteric metastasis.  The patient had new bilateral pulmonary nodularity with discrete nodule in the right lung and peribronchial thickening and small nodularity in the right middle lobe and lingula but no activity associated with these lesions. She is followed by Dr. Delton Coombes from pulmonary medicine. She was seen recently at [facility] by Dr. Reginia Naas as well as a GI surgeon Dr. Derek Mound and both of them recommended for her to continue on observation. The patient had repeat dotatate PET scan performed yesterday.  The final report was not available at the time of the visit but it became available later today at the time of this dictation.  Initially I was concerned about involvement of her liver with neuroendocrine carcinoma but the  final report showed physiologic activity in the liver and only persistent tracer avid lesion within the second portion of the duodenum compatible with neuroendocrine tumor with similar degree of FDG uptake with SUV max equal to 17.05.  There was no signs of tracer avid metastatic disease.  The patient also had a stable 5 mm left upper lobe nodule without significant tracer uptake and favored to be a benign process. The patient is very anxious and worried about her condition and wanted to see Dr. Reginia Naas again at [facility] for reevaluation and  recommendation regarding her condition. She was interested in discussion of her treatment options and I explained to her that observation is a good option as long as the patient is asymptomatic but also consideration of treatment with octreotide is an option. I will also refer her to nuclear medicine to discuss any other possible option of treatment based on the recent dotatate PET scan. I will arrange for the patient to come back for follow-up visit in 4 months for evaluation with repeat blood work. She was advised to call immediately if she has any other concerning symptoms in the interval. The patient voices understanding of current disease status and treatment options and is in agreement with the current care plan.  All questions were answered. The patient knows to call the clinic with any problems, questions or concerns. We can certainly see the patient much sooner if necessary. The total time spent in the appointment was 35 minutes.   Disclaimer: This note was dictated with voice recognition software. Similar sounding words can inadvertently be transcribed and may not be corrected upon review.

## 2022-08-10 ENCOUNTER — Telehealth: Payer: Self-pay | Admitting: Medical Oncology

## 2022-08-11 NOTE — Telephone Encounter (Signed)
She does not see the need to go to [facility] .

## 2022-08-12 NOTE — Telephone Encounter (Signed)
Per Dr. Arbutus Ped, pt notified that she should keep her appts in sept.   Does she need a 24 hour urine?

## 2022-08-15 ENCOUNTER — Other Ambulatory Visit: Payer: Self-pay | Admitting: Medical Oncology

## 2022-08-15 ENCOUNTER — Telehealth: Payer: Self-pay | Admitting: Internal Medicine

## 2022-08-15 DIAGNOSIS — C7A019 Malignant carcinoid tumor of the small intestine, unspecified portion: Secondary | ICD-10-CM

## 2022-08-15 NOTE — Telephone Encounter (Signed)
Called patient regarding September appointments, patient is notified.  

## 2022-09-14 ENCOUNTER — Encounter (INDEPENDENT_AMBULATORY_CARE_PROVIDER_SITE_OTHER): Payer: Self-pay

## 2022-09-19 ENCOUNTER — Telehealth: Payer: Self-pay | Admitting: Orthopaedic Surgery

## 2022-09-19 ENCOUNTER — Ambulatory Visit (INDEPENDENT_AMBULATORY_CARE_PROVIDER_SITE_OTHER): Payer: Medicare Other

## 2022-09-19 ENCOUNTER — Ambulatory Visit (INDEPENDENT_AMBULATORY_CARE_PROVIDER_SITE_OTHER): Payer: Medicare Other | Admitting: Student

## 2022-09-19 ENCOUNTER — Encounter (HOSPITAL_BASED_OUTPATIENT_CLINIC_OR_DEPARTMENT_OTHER): Payer: Self-pay | Admitting: Student

## 2022-09-19 DIAGNOSIS — S62327A Displaced fracture of shaft of fifth metacarpal bone, left hand, initial encounter for closed fracture: Secondary | ICD-10-CM | POA: Diagnosis not present

## 2022-09-19 DIAGNOSIS — M79642 Pain in left hand: Secondary | ICD-10-CM | POA: Diagnosis not present

## 2022-09-19 NOTE — Progress Notes (Signed)
Chief Complaint: Left hand pain     History of Present Illness:    [name] is a 77 y.o. female presenting today for evaluation of pain in her left hand.  This began 2 days ago after she sustained a fall just prior to leaving from the beach.  Patient was dragging a cooler down a flight of stairs backwards when she missed the last 1-2 stairs.  She is unsure of exactly how her hand was injured during the fall.  Most of the pain is located on the ulnar aspect of the hand and reports today that it is mild in severity.  She is on 5 mg Eliquis and has bruising extending throughout the hand.  She has taken some Tylenol for pain.  Did have some numbness located along the ulnar aspect of the fifth metacarpal which has improved.   Surgical History:   None  PMH/PSH/Family History/Social History/Meds/Allergies:    Past Medical History:  Diagnosis Date   ALLERGIC RHINITIS    ANEMIA-NOS    Arthritis    ASTHMA    Breast discharge    Carpal tunnel syndrome    COLONIC POLYPS, HX OF    Coronary artery disease    "mild CAD" noted on 12/05/17 in coronary CT scan   DIABETES MELLITUS, TYPE II    diet controlled   GERD    Hand tingling    HYPERLIPIDEMIA    HYPERTENSION    Neuroendocrine tumor    OSTEOPENIA    PONV (postoperative nausea and vomiting)    " it relaxes my bladder muscles and I have been known to pee all over the place"   Psoriasis    severe, began soriatane 01/2012   Pulmonary embolism (HCC)    Rectal fissure    Scoliosis    Wears glasses    Past Surgical History:  Procedure Laterality Date   benign rectal growth  2004   removed by Dr. Abbey Chatters   BIOPSY  08/09/2018   Procedure: BIOPSY;  Surgeon: [name], MD;  Location: [facility] ENDOSCOPY;  Service: Endoscopy;;   BIOPSY  08/22/2018   Procedure: BIOPSY;  Surgeon: Charna Elizabeth, MD;  Location: [facility] ENDOSCOPY;  Service: Endoscopy;;   BIOPSY  03/07/2019   Procedure: BIOPSY;  Surgeon:  Lemar Lofty., MD;  Location: [facility] ENDOSCOPY;  Service: Gastroenterology;;   BIOPSY  08/06/2020   Procedure: BIOPSY;  Surgeon: Lemar Lofty., MD;  Location: [facility] ENDOSCOPY;  Service: Gastroenterology;;   BREAST BIOPSY Right    BREAST EXCISIONAL BIOPSY Left    BREAST SURGERY  1988   biopsy   CESAREAN SECTION     COLONOSCOPY N/A 08/22/2018   Procedure: COLONOSCOPY;  Surgeon: Charna Elizabeth, MD;  Location: [facility] ENDOSCOPY;  Service: Endoscopy;  Laterality: N/A;   ENDOSCOPIC MUCOSAL RESECTION N/A 03/07/2019   Procedure: ENDOSCOPIC MUCOSAL RESECTION;  Surgeon: Meridee Score Netty Starring., MD;  Location: [facility] ENDOSCOPY;  Service: Gastroenterology;  Laterality: N/A;   ESOPHAGOGASTRODUODENOSCOPY (EGD) WITH PROPOFOL N/A 08/04/2018   Procedure: ESOPHAGOGASTRODUODENOSCOPY (EGD) WITH PROPOFOL;  Surgeon: Meryl Dare, MD;  Location: [facility] ENDOSCOPY;  Service: Gastroenterology;  Laterality: N/A;   ESOPHAGOGASTRODUODENOSCOPY (EGD) WITH PROPOFOL N/A 03/07/2019   Procedure: ESOPHAGOGASTRODUODENOSCOPY (EGD) WITH PROPOFOL;  Surgeon: Meridee Score Netty Starring., MD;  Location: [facility] ENDOSCOPY;  Service: Gastroenterology;  Laterality: N/A;   ESOPHAGOGASTRODUODENOSCOPY (EGD)  WITH PROPOFOL N/A 08/06/2020   Procedure: ESOPHAGOGASTRODUODENOSCOPY (EGD) WITH PROPOFOL;  Surgeon: Meridee Score Netty Starring., MD;  Location: [facility] ENDOSCOPY;  Service: Gastroenterology;  Laterality: N/A;   EUS N/A 03/07/2019   Procedure: UPPER ENDOSCOPIC ULTRASOUND (EUS) RADIAL;  Surgeon: Lemar Lofty., MD;  Location: [facility] ENDOSCOPY;  Service: Gastroenterology;  Laterality: N/A;   EUS N/A 08/06/2020   Procedure: UPPER ENDOSCOPIC ULTRASOUND (EUS) RADIAL;  Surgeon: Lemar Lofty., MD;  Location: [facility] ENDOSCOPY;  Service: Gastroenterology;  Laterality: N/A;   FINE NEEDLE ASPIRATION  03/07/2019   Procedure: FINE NEEDLE ASPIRATION (FNA) LINEAR;  Surgeon: Lemar Lofty., MD;  Location: [facility] ENDOSCOPY;  Service: Gastroenterology;;   FINE  NEEDLE ASPIRATION  08/06/2020   Procedure: FINE NEEDLE ASPIRATION;  Surgeon: Lemar Lofty., MD;  Location: [facility] ENDOSCOPY;  Service: Gastroenterology;;   Wenda Low SIGMOIDOSCOPY N/A 08/04/2018   Procedure: Arnell Sieving;  Surgeon: Meryl Dare, MD;  Location: [facility] ENDOSCOPY;  Service: Gastroenterology;  Laterality: N/A;   FLEXIBLE SIGMOIDOSCOPY N/A 08/09/2018   Procedure: FLEXIBLE SIGMOIDOSCOPY;  Surgeon: [name], MD;  Location: [facility] ENDOSCOPY;  Service: Endoscopy;  Laterality: N/A;   HEMOSTASIS CLIP PLACEMENT  03/07/2019   Procedure: HEMOSTASIS CLIP PLACEMENT;  Surgeon: Lemar Lofty., MD;  Location: [facility] ENDOSCOPY;  Service: Gastroenterology;;   SUBMUCOSAL LIFTING INJECTION  03/07/2019   Procedure: SUBMUCOSAL LIFTING INJECTION;  Surgeon: Lemar Lofty., MD;  Location: [facility] ENDOSCOPY;  Service: Gastroenterology;;   VIDEO BRONCHOSCOPY WITH ENDOBRONCHIAL NAVIGATION N/A 03/14/2018   Procedure: VIDEO BRONCHOSCOPY WITH ENDOBRONCHIAL NAVIGATION;  Surgeon: Leslye Peer, MD;  Location: [facility] OR;  Service: Thoracic;  Laterality: N/A;   Social History   Socioeconomic History   Marital status: Single    Spouse name: Not on file   Number of children: 1   Years of education: 16   Highest education level: Bachelor's degree (e.g., BA, AB, BS)  Occupational History   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  Has one child and one grandchild.  Retired Engineer, water.  Education: college.    Social Determinants of Health   Financial Resource Strain: Low Risk  (10/04/2021)   Overall Financial Resource Strain (CARDIA)    Difficulty of Paying Living Expenses: Not hard at all  Food Insecurity: No Food Insecurity (10/04/2021)   Hunger Vital Sign    Worried About Running Out of Food in the Last Year: Never true    Ran Out of Food in the Last Year: Never true  Transportation Needs: No Transportation Needs (10/04/2021)   PRAPARE - Administrator, Civil Service (Medical): No    Lack of Transportation (Non-Medical): No  Physical Activity: Insufficiently Active (10/04/2021)   Exercise Vital Sign    Days of Exercise per Week: 3 days    Minutes of Exercise per Session: 30 min  Stress: No  Stress Concern Present (10/04/2021)   [employer] of Occupational Health - Occupational Stress Questionnaire    Feeling of Stress : Not at all  Social Connections: Socially Isolated (10/04/2021)   Social Connection and Isolation Panel [NHANES]    Frequency of Communication with Friends and Family: Three times a week    Frequency of Social Gatherings with Friends and Family: Three times a week    Attends Religious Services: Never    Active Member of Clubs or Organizations: No    Attends Banker Meetings: Never    Marital Status: Widowed   Family History  Problem Relation Age of Onset   Lung cancer Father    Arthritis Other        Parents   Asthma Other        parent, other relative   Breast cancer Other        other relative   Hypertension Other        parent, other relative   Heart disease Other        parent, other relative   Heart disease Mother    Asthma Mother    Breast cancer Maternal Aunt 60   Parkinson's disease Maternal Grandmother    Rheumatic fever Maternal Grandfather    Allergies  Allergen Reactions   Phenergan [Promethazine Hcl] Shortness Of Breath and Anxiety   Benicar [Olmesartan]  Other (See Comments)    Colitis-sprue enteropathy   Repatha [Evolocumab]     Muscle pain and weakness   Amlodipine Swelling   Clarithromycin Nausea And Vomiting    May have episodes of syncope due to prolonged vomiting    Codeine Nausea And Vomiting    May have episodes of syncope due to prolonged vomiting   Erythromycin Nausea And Vomiting   Hydralazine Hcl     Drug-induced lupus - pt wishes to avoid   Statins     Muscle pain    Tetracycline Nausea And Vomiting   Current Outpatient Medications  Medication Sig Dispense Refill   Ascorbic Acid (VITAMIN C GUMMIE PO) Take 282 mg by mouth every evening. Each tablet 141 mg     calcium carbonate (TUMS EX) 750 MG chewable tablet Chew 750 mg by mouth daily.     Cholecalciferol (VITAMIN D3 GUMMIES ADULT PO) Take 2,000 Units by mouth every evening.     clindamycin (CLEOCIN T) 1 % external solution Apply 1 application topically daily as needed (wounds).      clobetasol (TEMOVATE) 0.05 % external solution Apply topically daily.     Coenzyme Q10 (COQ10 GUMMIES ADULT PO) Take 200 mg by mouth in the morning and at bedtime.     CREON 36000-114000 units CPEP capsule Take 36,000 Units by mouth 3 (three) times daily before meals.     diphenhydrAMINE (BENADRYL) 25 MG tablet Take 25 mg by mouth at bedtime.     ELIQUIS 5 MG TABS tablet TAKE 1 TABLET BY MOUTH TWICE A DAY 180 tablet 2   FINACEA 15 % gel Apply topically 2 (two) times daily.     folic acid (FOLVITE) 1 MG tablet TAKE 1 TABLET BY MOUTH EVERY DAY 90 tablet 1   glucose blood (ONETOUCH ULTRA) test strip TEST DAILY AND AS NEEDED 300 strip 1   halobetasol (ULTRAVATE) 0.05 % cream APPLY TO AFFECTED AREA TWICE A DAY     lisinopril (ZESTRIL) 10 MG tablet TAKE 1 TABLET BY MOUTH EVERY DAY 90 tablet 1   Multiple Vitamins-Minerals (ADULT GUMMY PO) Take 1  tablet by mouth every evening. Children [employer] gummie formula     omeprazole (PRILOSEC OTC) 20 MG tablet Take 20 mg by mouth daily. Takes 10 mg in am  and 10 mg in pm     ondansetron (ZOFRAN-ODT) 8 MG disintegrating tablet Take 1 tablet (8 mg total) by mouth every 8 (eight) hours as needed for nausea. 60 tablet 2   oxymetazoline (NASAL RELIEF) 0.05 % nasal spray Place into the nose.     polyethylene glycol (MIRALAX / GLYCOLAX) 17 g packet Take 17 g by mouth daily as needed.     rosuvastatin (CRESTOR) 5 MG tablet Take 1 tablet (5 mg total) by mouth 2 (two) times a week. Sometimes 3 times a week 45 tablet 3   Simethicone (GAS-X PO) Take 250 mg by mouth daily as needed.     VASCEPA 1 g capsule Take 2 capsules (2 g total) by mouth 2 (two) times daily. 360 capsule 3   vitamin B-12 (CYANOCOBALAMIN) 1000 MCG tablet Take 1,000 mcg by mouth daily.     No current facility-administered medications for this visit.   No results found.  Review of Systems:   A ROS was performed including pertinent positives and negatives as documented in the HPI.  Physical Exam :   Constitutional: NAD and appears stated age Neurological: Alert and oriented Psych: Appropriate affect and cooperative There were no vitals taken for this visit.   Comprehensive Musculoskeletal Exam:    Significant ecchymosis concentrated over the fifth metacarpal continuing over the dorsal aspect of the left hand to the third metacarpal.  Full active range of motion of the left wrist from 45 degrees extension to 60 degrees flexion.  Tenderness palpation over the shaft of the fifth metacarpal.  All five fingers warm and well-perfused.  Fifth finger very limited with flexion while trying to form a fist.  Radial pulse 2+.  Distal neurosensory exam is intact.  Imaging:   Xray (left hand 3 views): Nondisplaced oblique 5th metacarpal shaft fracture    I personally reviewed and interpreted the radiographs.   Assessment:   77 y.o. female with a fifth metacarpal shaft fracture due to a fall sustained 2 days ago.  This is nondisplaced and I believe she will heal in very well with conservative  management.  I will place her in a removable ulnar gutter splint today that she can remove for hygiene.  Recommend Tylenol as needed for pain control.  Will plan to see her back in 4 weeks at which time we will repeat x-rays to ensure proper healing.  Can consider weaning her out of the brace at that time if there is evidence of adequate callus formation.   Plan :    -Placed in removable ulnar gutter splint and plan return to clinic in 4 weeks for reassessment     I personally saw and evaluated the patient, and participated in the management and treatment plan.  Hazle Nordmann, PA-C Orthopedics  This document was dictated using Conservation officer, historic buildings. A reasonable attempt at proof reading has been made to minimize errors.

## 2022-10-07 ENCOUNTER — Other Ambulatory Visit: Payer: Self-pay | Admitting: Internal Medicine

## 2022-10-07 ENCOUNTER — Ambulatory Visit (INDEPENDENT_AMBULATORY_CARE_PROVIDER_SITE_OTHER): Payer: Medicare Other

## 2022-10-07 VITALS — BP 115/84 | HR 71 | Ht 61.0 in | Wt 142.0 lb

## 2022-10-07 DIAGNOSIS — Z0000 Encounter for general adult medical examination without abnormal findings: Secondary | ICD-10-CM | POA: Diagnosis not present

## 2022-10-07 DIAGNOSIS — Z1231 Encounter for screening mammogram for malignant neoplasm of breast: Secondary | ICD-10-CM

## 2022-10-07 NOTE — Progress Notes (Signed)
Subjective:   [name] is a 77 y.o. female who presents for Medicare Annual (Subsequent) preventive examination.  Visit Complete: Virtual  I connected with  [name] on 10/07/22 by a audio enabled telemedicine application and verified that I am speaking with the correct person using two identifiers.  Patient Location: Home  Provider Location: Office/Clinic  I discussed the limitations of evaluation and management by telemedicine. The patient expressed understanding and agreed to proceed.  Vital Signs: Vital signs are patient reported.   Review of Systems    Cardiac Risk Factors include: advanced age (>56men, >62 women);hypertension;Other (see comment), Risk factor comments: CAD, Pulmonary Embolism     Objective:    Today's Vitals   10/07/22 0937  BP: 115/84  Pulse: 71  SpO2: 96%  Weight: 142 lb (64.4 kg)  Height: 5\' 1"  (1.549 m)   Body mass index is 26.83 kg/m.     10/07/2022    9:57 AM 08/04/2022    8:52 AM 10/04/2021    9:02 AM 12/30/2020   10:36 AM 08/06/2020    6:52 AM 02/24/2020    8:50 AM 03/07/2019   10:21 AM  Advanced Directives  Does Patient Have a Medical Advance Directive? Yes Yes Yes Yes Yes Yes Yes  Type of Estate agent of [place];Living will Healthcare Power of [place];Living will Healthcare Power of [place];Living will Healthcare Power of [employer] Power of [employer] Power of [place];Living will  Does patient want to make changes to medical advance directive? No - Patient declined   No - Patient declined  No - Patient declined   Copy of Healthcare Power of Attorney in Chart? Yes - validated most recent copy scanned in chart (See row information)  No - copy requested No - copy requested No - copy requested  No - copy requested  Would patient like information on creating a medical advance directive?  No - Patient declined         Current Medications (verified) Outpatient Encounter  Medications as of 10/07/2022  Medication Sig   Ascorbic Acid (VITAMIN C GUMMIE PO) Take 282 mg by mouth every evening. Each tablet 141 mg   calcium carbonate (TUMS EX) 750 MG chewable tablet Chew 750 mg by mouth daily.   Cholecalciferol (VITAMIN D3 GUMMIES ADULT PO) Take 2,000 Units by mouth every evening.   clindamycin (CLEOCIN T) 1 % external solution Apply 1 application topically daily as needed (wounds).    clobetasol (TEMOVATE) 0.05 % external solution Apply topically daily.   Coenzyme Q10 (COQ10 GUMMIES ADULT PO) Take 200 mg by mouth in the morning and at bedtime.   CREON 36000-114000 units CPEP capsule Take 36,000 Units by mouth 3 (three) times daily before meals.   diphenhydrAMINE (BENADRYL) 25 MG tablet Take 25 mg by mouth at bedtime.   ELIQUIS 5 MG TABS tablet TAKE 1 TABLET BY MOUTH TWICE A DAY   FINACEA 15 % gel Apply topically 2 (two) times daily.   folic acid (FOLVITE) 1 MG tablet TAKE 1 TABLET BY MOUTH EVERY DAY   glucose blood (ONETOUCH ULTRA) test strip TEST DAILY AND AS NEEDED   halobetasol (ULTRAVATE) 0.05 % cream APPLY TO AFFECTED AREA TWICE A DAY   lisinopril (ZESTRIL) 10 MG tablet TAKE 1 TABLET BY MOUTH EVERY DAY   Multiple Vitamins-Minerals (ADULT GUMMY PO) Take 1 tablet by mouth every evening. Children [employer] gummie formula   omeprazole (PRILOSEC OTC) 20 MG tablet Take 20 mg by mouth  daily. Takes 10 mg in am and 10 mg in pm   ondansetron (ZOFRAN-ODT) 8 MG disintegrating tablet Take 1 tablet (8 mg total) by mouth every 8 (eight) hours as needed for nausea.   oxymetazoline (NASAL RELIEF) 0.05 % nasal spray Place into the nose.   polyethylene glycol (MIRALAX / GLYCOLAX) 17 g packet Take 17 g by mouth daily as needed.   rosuvastatin (CRESTOR) 5 MG tablet Take 1 tablet (5 mg total) by mouth 2 (two) times a week. Sometimes 3 times a week   Simethicone (GAS-X PO) Take 250 mg by mouth daily as needed.   VASCEPA 1 g capsule Take 2 capsules (2 g total) by mouth 2 (two) times  daily.   vitamin B-12 (CYANOCOBALAMIN) 1000 MCG tablet Take 1,000 mcg by mouth daily.   No facility-administered encounter medications on file as of 10/07/2022.    Allergies (verified) Phenergan [promethazine hcl], Benicar [olmesartan], Repatha [evolocumab], Amlodipine, Clarithromycin, Codeine, Erythromycin, Hydralazine hcl, Statins, and Tetracycline   History: Past Medical History:  Diagnosis Date   ALLERGIC RHINITIS    ANEMIA-NOS    Arthritis    ASTHMA    Breast discharge    Carpal tunnel syndrome    COLONIC POLYPS, HX OF    Coronary artery disease    "mild CAD" noted on 12/05/17 in coronary CT scan   DIABETES MELLITUS, TYPE II    diet controlled   GERD    Hand tingling    HYPERLIPIDEMIA    HYPERTENSION    Neuroendocrine tumor    OSTEOPENIA    PONV (postoperative nausea and vomiting)    " it relaxes my bladder muscles and I have been known to pee all over the place"   Psoriasis    severe, began soriatane 01/2012   Pulmonary embolism (HCC)    Rectal fissure    Scoliosis    Wears glasses    Past Surgical History:  Procedure Laterality Date   benign rectal growth  2004   removed by Dr. Abbey Chatters   BIOPSY  08/09/2018   Procedure: BIOPSY;  Surgeon: [name], MD;  Location: [facility] ENDOSCOPY;  Service: Endoscopy;;   BIOPSY  08/22/2018   Procedure: BIOPSY;  Surgeon: Charna Elizabeth, MD;  Location: [facility] ENDOSCOPY;  Service: Endoscopy;;   BIOPSY  03/07/2019   Procedure: BIOPSY;  Surgeon: Lemar Lofty., MD;  Location: [facility] ENDOSCOPY;  Service: Gastroenterology;;   BIOPSY  08/06/2020   Procedure: BIOPSY;  Surgeon: Lemar Lofty., MD;  Location: [facility] ENDOSCOPY;  Service: Gastroenterology;;   BREAST BIOPSY Right    BREAST EXCISIONAL BIOPSY Left    BREAST SURGERY  1988   biopsy   CESAREAN SECTION     COLONOSCOPY N/A 08/22/2018   Procedure: COLONOSCOPY;  Surgeon: Charna Elizabeth, MD;  Location: [facility] ENDOSCOPY;  Service: Endoscopy;  Laterality: N/A;   ENDOSCOPIC MUCOSAL  RESECTION N/A 03/07/2019   Procedure: ENDOSCOPIC MUCOSAL RESECTION;  Surgeon: Meridee Score Netty Starring., MD;  Location: [facility] ENDOSCOPY;  Service: Gastroenterology;  Laterality: N/A;   ESOPHAGOGASTRODUODENOSCOPY (EGD) WITH PROPOFOL N/A 08/04/2018   Procedure: ESOPHAGOGASTRODUODENOSCOPY (EGD) WITH PROPOFOL;  Surgeon: Meryl Dare, MD;  Location: [facility] ENDOSCOPY;  Service: Gastroenterology;  Laterality: N/A;   ESOPHAGOGASTRODUODENOSCOPY (EGD) WITH PROPOFOL N/A 03/07/2019   Procedure: ESOPHAGOGASTRODUODENOSCOPY (EGD) WITH PROPOFOL;  Surgeon: Meridee Score Netty Starring., MD;  Location: [facility] ENDOSCOPY;  Service: Gastroenterology;  Laterality: N/A;   ESOPHAGOGASTRODUODENOSCOPY (EGD) WITH PROPOFOL N/A 08/06/2020   Procedure: ESOPHAGOGASTRODUODENOSCOPY (EGD) WITH PROPOFOL;  Surgeon: Meridee Score Netty Starring., MD;  Location: [facility]  ENDOSCOPY;  Service: Gastroenterology;  Laterality: N/A;   EUS N/A 03/07/2019   Procedure: UPPER ENDOSCOPIC ULTRASOUND (EUS) RADIAL;  Surgeon: Lemar Lofty., MD;  Location: [facility] ENDOSCOPY;  Service: Gastroenterology;  Laterality: N/A;   EUS N/A 08/06/2020   Procedure: UPPER ENDOSCOPIC ULTRASOUND (EUS) RADIAL;  Surgeon: Lemar Lofty., MD;  Location: [facility] ENDOSCOPY;  Service: Gastroenterology;  Laterality: N/A;   FINE NEEDLE ASPIRATION  03/07/2019   Procedure: FINE NEEDLE ASPIRATION (FNA) LINEAR;  Surgeon: Lemar Lofty., MD;  Location: [facility] ENDOSCOPY;  Service: Gastroenterology;;   FINE NEEDLE ASPIRATION  08/06/2020   Procedure: FINE NEEDLE ASPIRATION;  Surgeon: Lemar Lofty., MD;  Location: [facility] ENDOSCOPY;  Service: Gastroenterology;;   Wenda Low SIGMOIDOSCOPY N/A 08/04/2018   Procedure: Arnell Sieving;  Surgeon: Meryl Dare, MD;  Location: [facility] ENDOSCOPY;  Service: Gastroenterology;  Laterality: N/A;   FLEXIBLE SIGMOIDOSCOPY N/A 08/09/2018   Procedure: FLEXIBLE SIGMOIDOSCOPY;  Surgeon: [name], MD;  Location: [facility] ENDOSCOPY;  Service: Endoscopy;  Laterality:  N/A;   HEMOSTASIS CLIP PLACEMENT  03/07/2019   Procedure: HEMOSTASIS CLIP PLACEMENT;  Surgeon: Meridee Score Netty Starring., MD;  Location: [facility] ENDOSCOPY;  Service: Gastroenterology;;   SUBMUCOSAL LIFTING INJECTION  03/07/2019   Procedure: SUBMUCOSAL LIFTING INJECTION;  Surgeon: Lemar Lofty., MD;  Location: [facility] ENDOSCOPY;  Service: Gastroenterology;;   VIDEO BRONCHOSCOPY WITH ENDOBRONCHIAL NAVIGATION N/A 03/14/2018   Procedure: VIDEO BRONCHOSCOPY WITH ENDOBRONCHIAL NAVIGATION;  Surgeon: Leslye Peer, MD;  Location: [facility] OR;  Service: Thoracic;  Laterality: N/A;   Family History  Problem Relation Age of Onset   Lung cancer Father    Arthritis Other        Parents   Asthma Other        parent, other relative   Breast cancer Other        other relative   Hypertension Other        parent, other relative   Heart disease Other        parent, other relative   Heart disease Mother    Asthma Mother    Breast cancer Maternal Aunt 67   Parkinson's disease Maternal Grandmother    Rheumatic fever Maternal Grandfather    Social History   Socioeconomic History   Marital status: Single    Spouse name: Not on file   Number of children: 1   Years of education: 16   Highest education level: Bachelor's degree (e.g., BA, AB, BS)  Occupational History   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  Has one child and one grandchild.  Retired Psychologist, occupational.  Education: college.    Social Determinants of Health   Financial Resource Strain: Low Risk  (10/04/2021)   Overall Financial Resource Strain (CARDIA)    Difficulty of Paying Living Expenses: Not hard at all  Food Insecurity: No Food  Insecurity (10/04/2021)   Hunger Vital Sign    Worried About Running Out of Food in the Last Year: Never true    Ran Out of Food in the Last Year: Never true  Transportation Needs: No Transportation Needs (10/04/2021)   PRAPARE - Administrator, Civil Service (Medical): No    Lack of Transportation (Non-Medical): No  Physical Activity: Insufficiently Active (10/04/2021)   Exercise Vital Sign    Days of Exercise per Week: 3 days    Minutes of Exercise per Session: 30 min  Stress: No Stress Concern Present (10/04/2021)   [employer] of Occupational Health - Occupational Stress Questionnaire    Feeling of Stress : Not at all  Social Connections: Socially Isolated (10/04/2021)   Social Connection and Isolation Panel [NHANES]    Frequency of Communication with Friends and Family: Three times a week    Frequency of Social Gatherings with Friends and Family: Three times a week    Attends Religious Services: Never    Active Member of Clubs or Organizations: No    Attends Banker Meetings: Never    Marital Status: Widowed    Tobacco Counseling Counseling given: Not Answered   Clinical Intake:  Pre-visit preparation completed: Yes  Pain : No/denies pain     BMI - recorded: 26.83 Nutritional Status: BMI 25 -29 Overweight Nutritional Risks: Nausea/ vomitting/ diarrhea (Nausea) Diabetes: Yes CBG done?: No Did pt. bring in CBG monitor from home?: No  How often do you need to have someone help you when you read instructions, pamphlets, or other written materials from your doctor or pharmacy?: 1 - Never  Interpreter Needed?: No  Information entered by :: Tiago Humphrey, RMA   Activities of Daily Living     10/07/2022    9:50 AM  In your present state of health, do you have any difficulty performing the following activities:  Hearing? 0  Vision? 0  Difficulty concentrating or making decisions? 0  Walking or climbing stairs? 0  Comment per pt-can do it but it is hard  Dressing or bathing? 1  Doing errands, shopping? 0  Preparing Food and eating ? N  Using the Toilet? N  In the past six months, have you accidently leaked urine? N  Do you have problems with loss of bowel control? N  Managing your Medications? N  Managing your Finances? N  Housekeeping or managing your Housekeeping? N    Patient Care Team: Pincus Sanes, MD as PCP - General (Internal Medicine) Meriam Sprague, MD as PCP - Cardiology (Cardiology) Venancio Poisson, MD (Dermatology) Blima Ledger, OD (Optometry) Patricia Nettle, MD (Orthopedic Surgery) Szabat, Vinnie Level, [facility] (Inactive) as Pharmacist (Pharmacist) Mar Daring, MD as Referring Physician (Gastroenterology)  Indicate any recent Medical Services you may have received from other than [facility] providers in the past year (date may be approximate).     Assessment:   This is a routine wellness examination for Kateri.  Hearing/Vision screen Hearing Screening - Comments:: Denies hearing difficulties    Vision Screening - Comments:: Wears eyeglasses  Dietary issues and exercise activities discussed:     Goals Addressed               This Visit's Progress     Patient Stated (pt-stated)   Not on track     To increase my activity within my physical limitations.      Depression Screen    06/17/2022    8:32 AM 04/07/2022  8:11 AM 10/04/2021    9:07 AM 10/04/2021    9:03 AM 10/04/2021    9:00 AM 03/23/2021    9:18 AM 02/24/2020    8:50 AM  PHQ 2/9 Scores  PHQ - 2 Score 0 0 0 0 0 2 1  PHQ- 9 Score 0 3         Fall Risk    10/07/2022    9:58 AM 06/17/2022    8:32 AM 04/07/2022    8:11 AM 10/04/2021    9:03 AM 03/23/2021    9:18 AM  Fall Risk    Falls in the past year? 1 0 0 0 0  Number falls in past yr: 0 0 0 0 0  Injury with Fall? 1 0 0 0 0  Comment Broke a bone in her lt hand.      Risk for fall due to : No Fall Risks No Fall Risks No Fall Risks    Follow up Falls evaluation completed;Falls prevention discussed Falls evaluation completed Falls evaluation completed Falls evaluation completed;Education provided     MEDICARE RISK AT HOME: Medicare Risk at Home Any stairs in or around the home?: No Home free of loose throw rugs in walkways, pet beds, electrical cords, etc?: Yes Adequate lighting in your home to reduce risk of falls?: Yes Life alert?: No Use of a cane, walker or w/c?: Yes (walker sometimes) Grab bars in the bathroom?: No Shower chair or bench in shower?: Yes Elevated toilet seat or a handicapped toilet?: No  TIMED UP AND GO:  Was the test performed?  No    Cognitive Function:        10/07/2022   10:00 AM 10/04/2021    9:08 AM  6CIT Screen  What Year? 0 points 0 points  What month? 0 points 0 points  What time? 0 points 0 points  Count back from 20  0 points  Months in reverse  0 points  Repeat phrase  0 points  Total Score  0 points    Immunizations Immunization History  Administered Date(s) Administered   COVID-19, mRNA, vaccine(Comirnaty)12 years and older 12/24/2021   Fluad Quad(high Dose 65+) 10/23/2018, 11/05/2019, 11/11/2020, 11/12/2021   Influenza Split 11/29/2010, 11/15/2011   Influenza, High Dose Seasonal PF 12/28/2012, 10/28/2015, 10/31/2016, 10/30/2017   Influenza,inj,Quad PF,6+ Mos 10/22/2013, 11/07/2014   PFIZER(Purple Top)SARS-COV-2 Vaccination 03/13/2019, 04/03/2019, 10/15/2019, 06/12/2020, 10/15/2020   PPD Test 08/27/2012, 11/06/2014, 01/18/2016   Pfizer Covid-19 Vaccine Bivalent Booster 66yrs & up 11/02/2020, 08/24/2021   Pneumococcal Conjugate-13 10/22/2013   Pneumococcal Polysaccharide-23 02/14/2002, 12/23/2014   Td 02/15/2004, 06/03/2014    TDAP status: Up to  date  Flu Vaccine status: Due, Education has been provided regarding the importance of this vaccine. Advised may receive this vaccine at local pharmacy or Health Dept. Aware to provide a copy of the vaccination record if obtained from local pharmacy or Health Dept. Verbalized acceptance and understanding.  Pneumococcal vaccine status: Up to date  Covid-19 vaccine status: Information provided on how to obtain vaccines.   Qualifies for Shingles Vaccine? Yes   Zostavax completed Yes   Shingrix Completed?: No.    Education has been provided regarding the importance of this vaccine. Patient has been advised to call insurance company to determine out of pocket expense if they have not yet received this vaccine. Advised may also receive vaccine at local pharmacy or Health Dept. Verbalized acceptance and understanding.  Screening Tests Health Maintenance  Topic Date Due   OPHTHALMOLOGY EXAM  07/01/2017   FOOT EXAM  05/02/2018   Diabetic kidney evaluation - Urine ACR  10/06/2022   INFLUENZA VACCINE  09/15/2022   HEMOGLOBIN A1C  10/06/2022   Zoster Vaccines- Shingrix (1 of 2) 02/11/2024 (Originally 10/23/1964)   Diabetic kidney evaluation - eGFR measurement  08/02/2023   Medicare Annual Wellness (AWV)  10/07/2023   DTaP/Tdap/Td (3 - Tdap) 06/02/2024   Pneumonia Vaccine 74+ Years old  Completed   Hepatitis C Screening  Completed   HPV VACCINES  Aged Out   DEXA SCAN  Discontinued   Colonoscopy  Discontinued   COVID-19 Vaccine  Discontinued    Health Maintenance  Health Maintenance Due  Topic Date Due   OPHTHALMOLOGY EXAM  07/01/2017   FOOT EXAM  05/02/2018   Diabetic kidney evaluation - Urine ACR  10/06/2022   INFLUENZA VACCINE  09/15/2022   HEMOGLOBIN A1C  10/06/2022    Colorectal cancer screening: No longer required.   Mammogram status: Completed 03/30/2021. Repeat every year  Bone Density status: Completed 03/26/2012. Results reflect: Bone density results: OSTEOPENIA. Repeat every 2  years.  Lung Cancer Screening: (Low Dose CT Chest recommended if Age 69-80 years, 20 pack-year currently smoking OR have quit w/in 15years.) does not qualify.   Lung Cancer Screening Referral: N/A  Additional Screening:  Hepatitis C Screening: does qualify; Completed 04/22/15  Vision Screening: Recommended annual ophthalmology exams for early detection of glaucoma and other disorders of the eye. Is the patient up to date with their annual eye exam?  No  Who is the provider or what is the name of the office in which the patient attends annual eye exams? Dr. Hyacinth Meeker If pt is not established with a provider, would they like to be referred to a provider to establish care? No .   Dental Screening: Recommended annual dental exams for proper oral hygiene  Diabetic Foot Exam: Diabetic Foot Exam: Completed 06/17/2022   Community Resource Referral / Chronic Care Management: CRR required this visit?  No   CCM required this visit?  No     Plan:     I have personally reviewed and noted the following in the patient's chart:   Medical and social history Use of alcohol, tobacco or illicit drugs  Current medications and supplements including opioid prescriptions. Patient is not currently taking opioid prescriptions. Functional ability and status Nutritional status Physical activity Advanced directives List of other physicians Hospitalizations, surgeries, and ER visits in previous 12 months Vitals Screenings to include cognitive, depression, and falls Referrals and appointments  In addition, I have reviewed and discussed with patient certain preventive protocols, quality metrics, and best practice recommendations. A written personalized care plan for preventive services as well as general preventive health recommendations were provided to patient.     Sinan Tuch L Delmos Velaquez, CMA   10/07/2022   After Visit Summary: (MyChart) Due to this being a telephonic visit, the after visit summary with  patients personalized plan was offered to patient via MyChart   Nurse Notes: Patient is due for a mammogram this year, which she stated that she would call and schedule her appointment soon.  She is also due for a annual eye examination along with a DEXA.  Patient stated that she would call Dr. Hyacinth Meeker to schedule her eye exam, however declines the DEXA screening.

## 2022-10-07 NOTE — Patient Instructions (Signed)
Ms. Kerkstra , Thank you for taking time to come for your Medicare Wellness Visit. I appreciate your ongoing commitment to your health goals. Please review the following plan we discussed and let me know if I can assist you in the future.   Referrals/Orders/Follow-Ups/Clinician Recommendations: Remember to call and get your Mammogram scheduled for this year.  Also call Dr. Hyacinth Meeker to schedule your yearly eye exam.  It will soon be time for your Flu and Covid vaccines, as you can get those at your local pharmacy.  It was nice talking to you today.  Each day, aim for 6 glasses of water, plenty of protein in your diet and try to get up and walk/ stretch every hour for 5-10 minutes at a time.    This is a list of the screening recommended for you and due dates:  Health Maintenance  Topic Date Due   Eye exam for diabetics  07/01/2017   Yearly kidney health urinalysis for diabetes  10/06/2022   Flu Shot  09/15/2022   Hemoglobin A1C  10/06/2022   Zoster (Shingles) Vaccine (1 of 2) 02/11/2024*   Complete foot exam   06/22/2023   Yearly kidney function blood test for diabetes  08/02/2023   Medicare Annual Wellness Visit  10/07/2023   DTaP/Tdap/Td vaccine (3 - Tdap) 06/02/2024   Pneumonia Vaccine  Completed   Hepatitis C Screening  Completed   HPV Vaccine  Aged Out   DEXA scan (bone density measurement)  Discontinued   Colon Cancer Screening  Discontinued   COVID-19 Vaccine  Discontinued  *Topic was postponed. The date shown is not the original due date.    Advanced directives: (In Chart) A copy of your advanced directives are scanned into your chart should your provider ever need it.  Next Medicare Annual Wellness Visit scheduled for next year: Yes

## 2022-10-10 ENCOUNTER — Encounter: Payer: Self-pay | Admitting: Internal Medicine

## 2022-10-10 NOTE — Patient Instructions (Addendum)
      Blood work was ordered.   The lab is on the first floor.    Medications changes include :       A referral was ordered and someone will call you to schedule an appointment.     Return in about 6 months (around 04/13/2023) for Physical Exam.

## 2022-10-10 NOTE — Progress Notes (Unsigned)
Subjective:    Patient ID: [name], female    DOB: 1945-10-10, 77 y.o.   MRN: [id number]      HPI Rigley is here for a Physical exam and her chronic medical problems.    Tired, legs are weak.    Not doing anything she should - eating too many sweets, not exercising.  She is not taking good care of herself.  Has large hiatal hernia.  GI think she should have surgery.  She does have intermittent epigastric pain  Drinking about 5 glasses of water a day  Medications and allergies reviewed with patient and updated if appropriate.  Current Outpatient Medications on File Prior to Visit  Medication Sig Dispense Refill   Ascorbic Acid (VITAMIN C GUMMIE PO) Take 282 mg by mouth every evening. Each tablet 141 mg     calcium carbonate (TUMS EX) 750 MG chewable tablet Chew 750 mg by mouth daily.     Cholecalciferol (VITAMIN D3 GUMMIES ADULT PO) Take 2,000 Units by mouth every evening.     clindamycin (CLEOCIN T) 1 % external solution Apply 1 application topically daily as needed (wounds).      clobetasol (TEMOVATE) 0.05 % external solution Apply topically daily.     Coenzyme Q10 (COQ10 GUMMIES ADULT PO) Take 200 mg by mouth in the morning and at bedtime.     CREON 36000-114000 units CPEP capsule Take 36,000 Units by mouth 3 (three) times daily before meals.     diphenhydrAMINE (BENADRYL) 25 MG tablet Take 25 mg by mouth at bedtime.     ELIQUIS 5 MG TABS tablet TAKE 1 TABLET BY MOUTH TWICE A DAY 180 tablet 2   FINACEA 15 % gel Apply topically 2 (two) times daily.     folic acid (FOLVITE) 1 MG tablet TAKE 1 TABLET BY MOUTH EVERY DAY 90 tablet 1   glucose blood (ONETOUCH ULTRA) test strip TEST DAILY AND AS NEEDED 300 strip 1   halobetasol (ULTRAVATE) 0.05 % cream APPLY TO AFFECTED AREA TWICE A DAY     lisinopril (ZESTRIL) 10 MG tablet TAKE 1 TABLET BY MOUTH EVERY DAY 90 tablet 1   Multiple Vitamins-Minerals (ADULT GUMMY PO) Take 1 tablet by mouth every evening. Children [employer] gummie formula     omeprazole (PRILOSEC OTC) 20 MG tablet Take 20 mg by mouth daily. Takes 10 mg in am and 10 mg in pm     ondansetron (ZOFRAN-ODT) 8 MG disintegrating tablet Take 1 tablet (8 mg total) by mouth every 8 (eight) hours as needed for nausea. 60 tablet 2   oxymetazoline (NASAL RELIEF) 0.05 % nasal spray Place into the nose.     polyethylene glycol (MIRALAX / GLYCOLAX) 17 g packet Take 17 g by mouth daily as needed.     rosuvastatin (CRESTOR) 5 MG tablet Take 1 tablet (5 mg total) by mouth 2 (two) times a week. Sometimes 3 times a week 45 tablet 3   Simethicone (GAS-X PO) Take 250 mg by mouth daily as needed.     VASCEPA 1 g capsule Take 2 capsules (2 g total) by mouth 2 (two) times daily. 360 capsule 3   vitamin B-12 (CYANOCOBALAMIN) 1000 MCG tablet Take 1,000 mcg by mouth daily.     No current facility-administered medications on file prior to visit.    Review of Systems  Constitutional:  Positive for fatigue. Negative for fever.  Eyes:  Positive for visual disturbance (occular migraines).  Respiratory:  Positive for  cough (? lisinopril) and shortness of breath. Negative for wheezing.   Cardiovascular:  Positive for leg swelling (right foot - chronic). Negative for chest pain and palpitations.  Gastrointestinal:  Positive for abdominal pain (occ from large hiatal hernia), constipation and nausea. Negative for diarrhea.       Occ gerd  Genitourinary:  Negative for dysuria.  Musculoskeletal:  Positive for arthralgias. Negative for myalgias.  Skin:  Positive for rash (psoriasis).  Neurological:  Negative for light-headedness and headaches.  Psychiatric/Behavioral:  Negative for dysphoric mood. The patient is not nervous/anxious.        Objective:   Vitals:   10/11/22 0800  BP: (!) 144/76  Pulse: 84  Temp: 98 F (36.7 C)  SpO2: 94%   Filed Weights   10/11/22 0800  Weight: 142 lb 9.6 oz (64.7 kg)   Body mass index is 26.94 kg/m.  BP Readings from Last 3  Encounters:  10/11/22 (!) 144/76  10/07/22 115/84  08/04/22 (!) 154/87    Wt Readings from Last 3 Encounters:  10/11/22 142 lb 9.6 oz (64.7 kg)  10/07/22 142 lb (64.4 kg)  08/04/22 142 lb 1.6 oz (64.5 kg)       Physical Exam Constitutional: She appears well-developed and well-nourished. No distress.  HENT:  Head: Normocephalic and atraumatic.  Right Ear: External ear normal. Normal ear canal and TM Left Ear: External ear normal.  Normal ear canal and TM Mouth/Throat: Oropharynx is clear and moist.  Eyes: Conjunctivae normal.  Neck: Neck supple. No tracheal deviation present. No thyromegaly present.  No carotid bruit  Cardiovascular: Normal rate, regular rhythm and normal heart sounds.   No murmur heard.  No edema. Pulmonary/Chest: Effort normal and breath sounds normal. No respiratory distress. She has no wheezes. She has no rales.  Breast: deferred   Abdominal: Soft. She exhibits no distension. There is no tenderness.  Lymphadenopathy: She has no cervical adenopathy.  Skin: Skin is warm and dry. She is not diaphoretic.  Psychiatric: She has a normal mood and affect. Her behavior is normal.     Lab Results  Component Value Date   WBC 7.3 08/02/2022   HGB 12.2 08/02/2022   HCT 37.9 08/02/2022   PLT 315 08/02/2022   GLUCOSE 119 (H) 08/02/2022   CHOL 210 (H) 04/07/2022   TRIG 165.0 (H) 04/07/2022   HDL 59.50 04/07/2022   LDLDIRECT 113.0 10/05/2021   LDLCALC 117 (H) 04/07/2022   ALT 21 08/02/2022   AST 22 08/02/2022   NA 139 08/02/2022   K 4.4 08/02/2022   CL 103 08/02/2022   CREATININE 1.02 (H) 08/02/2022   BUN 13 08/02/2022   CO2 30 08/02/2022   TSH 2.63 11/11/2020   INR 1.9 (H) 06/23/2020   HGBA1C 7.0 (H) 04/07/2022   MICROALBUR <0.7 10/05/2021         Assessment & Plan:   Physical exam: Screening blood work  ordered Exercise  none Weight  is ok Substance abuse  none   Reviewed recommended immunizations.   Health Maintenance  Topic Date Due    OPHTHALMOLOGY EXAM  07/01/2017   Diabetic kidney evaluation - Urine ACR  10/06/2022   INFLUENZA VACCINE  09/15/2022   HEMOGLOBIN A1C  10/06/2022   Zoster Vaccines- Shingrix (1 of 2) 02/11/2024 (Originally 10/23/1964)   FOOT EXAM  06/22/2023   Diabetic kidney evaluation - eGFR measurement  08/02/2023   Medicare Annual Wellness (AWV)  10/07/2023   DTaP/Tdap/Td (3 - Tdap) 06/02/2024   Pneumonia Vaccine 65+  Years old  Completed   Hepatitis C Screening  Completed   HPV VACCINES  Aged Out   DEXA SCAN  Discontinued   Colonoscopy  Discontinued   COVID-19 Vaccine  Discontinued          See Problem List for Assessment and Plan of chronic medical problems.

## 2022-10-11 ENCOUNTER — Ambulatory Visit (INDEPENDENT_AMBULATORY_CARE_PROVIDER_SITE_OTHER): Payer: Medicare Other | Admitting: Internal Medicine

## 2022-10-11 VITALS — BP 140/78 | HR 84 | Temp 98.0°F | Ht 61.0 in | Wt 142.6 lb

## 2022-10-11 DIAGNOSIS — R600 Localized edema: Secondary | ICD-10-CM

## 2022-10-11 DIAGNOSIS — K8681 Exocrine pancreatic insufficiency: Secondary | ICD-10-CM

## 2022-10-11 DIAGNOSIS — R29898 Other symptoms and signs involving the musculoskeletal system: Secondary | ICD-10-CM

## 2022-10-11 DIAGNOSIS — N1831 Chronic kidney disease, stage 3a: Secondary | ICD-10-CM

## 2022-10-11 DIAGNOSIS — E118 Type 2 diabetes mellitus with unspecified complications: Secondary | ICD-10-CM | POA: Diagnosis not present

## 2022-10-11 DIAGNOSIS — I10 Essential (primary) hypertension: Secondary | ICD-10-CM | POA: Diagnosis not present

## 2022-10-11 DIAGNOSIS — E785 Hyperlipidemia, unspecified: Secondary | ICD-10-CM | POA: Diagnosis not present

## 2022-10-11 DIAGNOSIS — E559 Vitamin D deficiency, unspecified: Secondary | ICD-10-CM

## 2022-10-11 LAB — CBC WITH DIFFERENTIAL/PLATELET
Basophils Absolute: 0.1 10*3/uL (ref 0.0–0.1)
Basophils Relative: 1.3 % (ref 0.0–3.0)
Eosinophils Absolute: 0.3 10*3/uL (ref 0.0–0.7)
Eosinophils Relative: 3.7 % (ref 0.0–5.0)
HCT: 39.2 % (ref 36.0–46.0)
Hemoglobin: 12.6 g/dL (ref 12.0–15.0)
Lymphocytes Relative: 20.3 % (ref 12.0–46.0)
Lymphs Abs: 1.7 10*3/uL (ref 0.7–4.0)
MCHC: 32.1 g/dL (ref 30.0–36.0)
MCV: 90 fl (ref 78.0–100.0)
Monocytes Absolute: 0.5 10*3/uL (ref 0.1–1.0)
Monocytes Relative: 5.6 % (ref 3.0–12.0)
Neutro Abs: 5.7 10*3/uL (ref 1.4–7.7)
Neutrophils Relative %: 69.1 % (ref 43.0–77.0)
Platelets: 325 10*3/uL (ref 150.0–400.0)
RBC: 4.35 Mil/uL (ref 3.87–5.11)
RDW: 15.8 % — ABNORMAL HIGH (ref 11.5–15.5)
WBC: 8.3 10*3/uL (ref 4.0–10.5)

## 2022-10-11 LAB — LIPID PANEL
Cholesterol: 217 mg/dL — ABNORMAL HIGH (ref 0–200)
HDL: 55.9 mg/dL (ref >39.00)
LDL Cholesterol: 124 mg/dL — ABNORMAL HIGH (ref 0–99)
NonHDL: 160.62
Total CHOL/HDL Ratio: 4
Triglycerides: 183 mg/dL — ABNORMAL HIGH (ref 0.0–149.0)
VLDL: 36.6 mg/dL (ref 0.0–40.0)

## 2022-10-11 LAB — COMPREHENSIVE METABOLIC PANEL
ALT: 20 U/L (ref 0–35)
AST: 20 U/L (ref 0–37)
Albumin: 3.9 g/dL (ref 3.5–5.2)
Alkaline Phosphatase: 73 U/L (ref 39–117)
BUN: 19 mg/dL (ref 6–23)
CO2: 31 mEq/L (ref 19–32)
Calcium: 9.7 mg/dL (ref 8.4–10.5)
Chloride: 99 mEq/L (ref 96–112)
Creatinine, Ser: 1 mg/dL (ref 0.40–1.20)
GFR: 53.79 mL/min — ABNORMAL LOW (ref >60.00)
Glucose, Bld: 134 mg/dL — ABNORMAL HIGH (ref 70–99)
Potassium: 4.2 mEq/L (ref 3.5–5.1)
Sodium: 139 mEq/L (ref 135–145)
Total Bilirubin: 0.6 mg/dL (ref 0.2–1.2)
Total Protein: 7.3 g/dL (ref 6.0–8.3)

## 2022-10-11 LAB — HEMOGLOBIN A1C: Hgb A1c MFr Bld: 6.8 % — ABNORMAL HIGH (ref 4.6–6.5)

## 2022-10-11 LAB — MICROALBUMIN / CREATININE URINE RATIO
Creatinine,U: 95.8 mg/dL
Microalb Creat Ratio: 0.7 mg/g (ref 0.0–30.0)
Microalb, Ur: <0.7 mg/dL (ref 0.0–1.9)

## 2022-10-11 LAB — MAGNESIUM: Magnesium: 1.3 mg/dL — ABNORMAL LOW (ref 1.5–2.5)

## 2022-10-11 NOTE — Assessment & Plan Note (Addendum)
Chronic Taking vitamin D daily 

## 2022-10-11 NOTE — Assessment & Plan Note (Signed)
H/o low magnesium Some cramping Not currently taking magnesium  Check level

## 2022-10-11 NOTE — Assessment & Plan Note (Signed)
Chronic No longer seeing nephrology Mild, stable CMP, CBC

## 2022-10-11 NOTE — Assessment & Plan Note (Signed)
Chronic Controlled Stressed regular walking, elevating legs when sitting

## 2022-10-11 NOTE — Assessment & Plan Note (Addendum)
Chronic With hyperlipidemia  Lab Results  Component Value Date   HGBA1C 7.0 (H) 04/07/2022   Sugars not ideally controlled She has not been compliant with a diabetic diet Check A1c, Urine microalbumin Continue lifestyle control Stressed regular exercise, diabetic diet-she has been eating more sugars and carbs

## 2022-10-11 NOTE — Assessment & Plan Note (Addendum)
Chronic Following with GI On Creon Has intermittent abdominal pain, nausea and constipation

## 2022-10-11 NOTE — Assessment & Plan Note (Signed)
Chronic Blood pressure elevated here as usual-typically better controlled at home Continue to monitor at home No change in medication Continue lisinopril 10 mg daily

## 2022-10-11 NOTE — Assessment & Plan Note (Signed)
New Has been experiencing bilateral leg weakness-feels less balanced and legs feel weak.  She is not exercising She does have chronic back pain which could be related as well Discussed physical therapy-she deferred for now, but if she wants to do that she will let me know Encouraged more regular exercise and working on her balance

## 2022-10-11 NOTE — Assessment & Plan Note (Signed)
Chronic Regular exercise and healthy diet encouraged Check lipid panel  Continue Crestor 5 mg twice weekly 

## 2022-10-13 ENCOUNTER — Other Ambulatory Visit: Payer: Self-pay

## 2022-10-13 MED ORDER — MAGNESIUM OXIDE 400 MG PO TABS: 1.0000 | ORAL_TABLET | Freq: Every day | ORAL | 1 refills | Status: DC

## 2022-10-14 ENCOUNTER — Telehealth: Payer: Self-pay | Admitting: Internal Medicine

## 2022-10-14 NOTE — Telephone Encounter (Signed)
Called patient regarding upcoming September appointments, patient is notified.  

## 2022-10-18 ENCOUNTER — Ambulatory Visit (HOSPITAL_BASED_OUTPATIENT_CLINIC_OR_DEPARTMENT_OTHER): Payer: Medicare Other

## 2022-10-18 ENCOUNTER — Encounter (HOSPITAL_BASED_OUTPATIENT_CLINIC_OR_DEPARTMENT_OTHER): Payer: Self-pay | Admitting: Student

## 2022-10-18 ENCOUNTER — Ambulatory Visit (INDEPENDENT_AMBULATORY_CARE_PROVIDER_SITE_OTHER): Payer: Medicare Other | Admitting: Student

## 2022-10-18 DIAGNOSIS — S62327A Displaced fracture of shaft of fifth metacarpal bone, left hand, initial encounter for closed fracture: Secondary | ICD-10-CM | POA: Diagnosis not present

## 2022-10-18 DIAGNOSIS — S62327D Displaced fracture of shaft of fifth metacarpal bone, left hand, subsequent encounter for fracture with routine healing: Secondary | ICD-10-CM | POA: Diagnosis not present

## 2022-10-18 NOTE — Progress Notes (Signed)
Chief Complaint: Left hand pain     History of Present Illness:   10/18/22: Patient presents today for 4-week follow-up after left fifth metacarpal shaft fracture.  Reports that overall she is doing well.  She has some mild, achy pain with an occasional brief sharp pain.  She has been wearing the removable ulnar gutter splint other than for hygiene.  Denies any numbness or tingling.    09/19/22: AASIA DOWLEN is a 77 y.o. female presenting today for evaluation of pain in her left hand.  This began 2 days ago after she sustained a fall just prior to leaving from the beach.  Patient was dragging a cooler down a flight of stairs backwards when she missed the last 1-2 stairs.  She is unsure of exactly how her hand was injured during the fall.  Most of the pain is located on the ulnar aspect of the hand and reports today that it is mild in severity.  She is on 5 mg Eliquis and has bruising extending throughout the hand.  She has taken some Tylenol for pain.  Did have some numbness located along the ulnar aspect of the fifth metacarpal which has improved.   Surgical History:   None  PMH/PSH/Family History/Social History/Meds/Allergies:    Past Medical History:  Diagnosis Date   ALLERGIC RHINITIS    ANEMIA-NOS    Arthritis    ASTHMA    Breast discharge    Carpal tunnel syndrome    COLONIC POLYPS, HX OF    Coronary artery disease    "mild CAD" noted on 12/05/17 in coronary CT scan   DIABETES MELLITUS, TYPE II    diet controlled   GERD    Hand tingling    HYPERLIPIDEMIA    HYPERTENSION    Neuroendocrine tumor    OSTEOPENIA    PONV (postoperative nausea and vomiting)    " it relaxes my bladder muscles and I have been known to pee all over the place"   Psoriasis    severe, began soriatane 01/2012   Pulmonary embolism (HCC)    Rectal fissure    Scoliosis    Wears glasses    Past Surgical History:  Procedure Laterality Date   benign rectal  growth  2004   removed by Dr. Abbey Chatters   BIOPSY  08/09/2018   Procedure: BIOPSY;  Surgeon: [name], MD;  Location: [facility] ENDOSCOPY;  Service: Endoscopy;;   BIOPSY  08/22/2018   Procedure: BIOPSY;  Surgeon: Charna Elizabeth, MD;  Location: [facility] ENDOSCOPY;  Service: Endoscopy;;   BIOPSY  03/07/2019   Procedure: BIOPSY;  Surgeon: Lemar Lofty., MD;  Location: [facility] ENDOSCOPY;  Service: Gastroenterology;;   BIOPSY  08/06/2020   Procedure: BIOPSY;  Surgeon: Lemar Lofty., MD;  Location: [facility] ENDOSCOPY;  Service: Gastroenterology;;   BREAST BIOPSY Right    BREAST EXCISIONAL BIOPSY Left    BREAST SURGERY  1988   biopsy   CESAREAN SECTION     COLONOSCOPY N/A 08/22/2018   Procedure: COLONOSCOPY;  Surgeon: Charna Elizabeth, MD;  Location: [facility] ENDOSCOPY;  Service: Endoscopy;  Laterality: N/A;   ENDOSCOPIC MUCOSAL RESECTION N/A 03/07/2019   Procedure: ENDOSCOPIC MUCOSAL RESECTION;  Surgeon: Meridee Score Netty Starring., MD;  Location: [facility] ENDOSCOPY;  Service: Gastroenterology;  Laterality: N/A;   ESOPHAGOGASTRODUODENOSCOPY (EGD) WITH PROPOFOL N/A 08/04/2018  Procedure: ESOPHAGOGASTRODUODENOSCOPY (EGD) WITH PROPOFOL;  Surgeon: Meryl Dare, MD;  Location: [facility] ENDOSCOPY;  Service: Gastroenterology;  Laterality: N/A;   ESOPHAGOGASTRODUODENOSCOPY (EGD) WITH PROPOFOL N/A 03/07/2019   Procedure: ESOPHAGOGASTRODUODENOSCOPY (EGD) WITH PROPOFOL;  Surgeon: Meridee Score Netty Starring., MD;  Location: [facility] ENDOSCOPY;  Service: Gastroenterology;  Laterality: N/A;   ESOPHAGOGASTRODUODENOSCOPY (EGD) WITH PROPOFOL N/A 08/06/2020   Procedure: ESOPHAGOGASTRODUODENOSCOPY (EGD) WITH PROPOFOL;  Surgeon: Meridee Score Netty Starring., MD;  Location: [facility] ENDOSCOPY;  Service: Gastroenterology;  Laterality: N/A;   EUS N/A 03/07/2019   Procedure: UPPER ENDOSCOPIC ULTRASOUND (EUS) RADIAL;  Surgeon: Lemar Lofty., MD;  Location: [facility] ENDOSCOPY;  Service: Gastroenterology;  Laterality: N/A;   EUS N/A 08/06/2020   Procedure: UPPER  ENDOSCOPIC ULTRASOUND (EUS) RADIAL;  Surgeon: Lemar Lofty., MD;  Location: [facility] ENDOSCOPY;  Service: Gastroenterology;  Laterality: N/A;   FINE NEEDLE ASPIRATION  03/07/2019   Procedure: FINE NEEDLE ASPIRATION (FNA) LINEAR;  Surgeon: Lemar Lofty., MD;  Location: [facility] ENDOSCOPY;  Service: Gastroenterology;;   FINE NEEDLE ASPIRATION  08/06/2020   Procedure: FINE NEEDLE ASPIRATION;  Surgeon: Lemar Lofty., MD;  Location: [facility] ENDOSCOPY;  Service: Gastroenterology;;   Wenda Low SIGMOIDOSCOPY N/A 08/04/2018   Procedure: Arnell Sieving;  Surgeon: Meryl Dare, MD;  Location: [facility] ENDOSCOPY;  Service: Gastroenterology;  Laterality: N/A;   FLEXIBLE SIGMOIDOSCOPY N/A 08/09/2018   Procedure: FLEXIBLE SIGMOIDOSCOPY;  Surgeon: [name], MD;  Location: [facility] ENDOSCOPY;  Service: Endoscopy;  Laterality: N/A;   HEMOSTASIS CLIP PLACEMENT  03/07/2019   Procedure: HEMOSTASIS CLIP PLACEMENT;  Surgeon: Lemar Lofty., MD;  Location: [facility] ENDOSCOPY;  Service: Gastroenterology;;   SUBMUCOSAL LIFTING INJECTION  03/07/2019   Procedure: SUBMUCOSAL LIFTING INJECTION;  Surgeon: Lemar Lofty., MD;  Location: [facility] ENDOSCOPY;  Service: Gastroenterology;;   VIDEO BRONCHOSCOPY WITH ENDOBRONCHIAL NAVIGATION N/A 03/14/2018   Procedure: VIDEO BRONCHOSCOPY WITH ENDOBRONCHIAL NAVIGATION;  Surgeon: Leslye Peer, MD;  Location: [facility] OR;  Service: Thoracic;  Laterality: N/A;   Social History   Socioeconomic History   Marital status: Single    Spouse name: Not on file   Number of children: 1   Years of education: 16   Highest education level: Bachelor's degree (e.g., BA, AB, BS)  Occupational History   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  Has one child and one grandchild.  Retired Development worker, international aid.  Education: college.    Social Determinants of Health   Financial Resource Strain: Low Risk  (10/07/2022)   Overall Financial Resource Strain (CARDIA)    Difficulty of Paying Living Expenses: Not hard at all  Food Insecurity: No Food Insecurity (10/07/2022)   Hunger Vital Sign    Worried About Running Out of Food in the Last Year: Never true    Ran Out of Food in the Last Year: Never true  Transportation Needs: No Transportation Needs (  10/07/2022)   PRAPARE - Administrator, Civil Service (Medical): No    Lack of Transportation (Non-Medical): No  Physical Activity: Inactive (10/07/2022)   Exercise Vital Sign    Days of Exercise per Week: 0 days    Minutes of Exercise per Session: 0 min  Stress: No Stress Concern Present (10/07/2022)   [employer] of Occupational Health - Occupational Stress Questionnaire    Feeling of Stress : Only a little  Social Connections: Socially Isolated (10/07/2022)   Social Connection and Isolation Panel [NHANES]    Frequency of Communication with Friends and Family: More than three times a week    Frequency of Social Gatherings with Friends and Family: Once a week    Attends Religious Services: Never    Database administrator or Organizations: No    Attends Banker Meetings: Never    Marital Status: Widowed   Family History  Problem Relation Age of Onset   Lung cancer Father    Arthritis Other        Parents   Asthma Other        parent, other relative   Breast cancer Other        other relative   Hypertension Other        parent, other relative   Heart  disease Other        parent, other relative   Heart disease Mother    Asthma Mother    Breast cancer Maternal Aunt 48   Parkinson's disease Maternal Grandmother    Rheumatic fever Maternal Grandfather    Allergies  Allergen Reactions   Phenergan [Promethazine Hcl] Shortness Of Breath and Anxiety   Benicar [Olmesartan] Other (See Comments)    Colitis-sprue enteropathy   Repatha [Evolocumab]     Muscle pain and weakness   Amlodipine Swelling   Clarithromycin Nausea And Vomiting    May have episodes of syncope due to prolonged vomiting    Codeine Nausea And Vomiting    May have episodes of syncope due to prolonged vomiting   Erythromycin Nausea And Vomiting   Hydralazine Hcl     Drug-induced lupus - pt wishes to avoid   Statins     Muscle pain    Tetracycline Nausea And Vomiting   Current Outpatient Medications  Medication Sig Dispense Refill   Ascorbic Acid (VITAMIN C GUMMIE PO) Take 282 mg by mouth every evening. Each tablet 141 mg     calcium carbonate (TUMS EX) 750 MG chewable tablet Chew 750 mg by mouth daily.     Cholecalciferol (VITAMIN D3 GUMMIES ADULT PO) Take 2,000 Units by mouth every evening.     clindamycin (CLEOCIN T) 1 % external solution Apply 1 application topically daily as needed (wounds).      clobetasol (TEMOVATE) 0.05 % external solution Apply topically daily.     Coenzyme Q10 (COQ10 GUMMIES ADULT PO) Take 200 mg by mouth in the morning and at bedtime.     CREON 36000-114000 units CPEP capsule Take 36,000 Units by mouth 3 (three) times daily before meals.     diphenhydrAMINE (BENADRYL) 25 MG tablet Take 25 mg by mouth at bedtime.     ELIQUIS 5 MG TABS tablet TAKE 1 TABLET BY MOUTH TWICE A DAY 180 tablet 2   FINACEA 15 % gel Apply topically 2 (two) times daily.     folic acid (FOLVITE) 1 MG tablet TAKE 1 TABLET BY MOUTH EVERY DAY 90 tablet 1  glucose blood (ONETOUCH ULTRA) test strip TEST DAILY AND AS NEEDED 300 strip 1   halobetasol (ULTRAVATE) 0.05 %  cream APPLY TO AFFECTED AREA TWICE A DAY     lisinopril (ZESTRIL) 10 MG tablet TAKE 1 TABLET BY MOUTH EVERY DAY 90 tablet 1   magnesium oxide (MAG-OX) 400 MG tablet Take 1 tablet (400 mg total) by mouth daily. 90 tablet 1   Multiple Vitamins-Minerals (ADULT GUMMY PO) Take 1 tablet by mouth every evening. Children [employer] gummie formula     omeprazole (PRILOSEC OTC) 20 MG tablet Take 20 mg by mouth daily. Takes 10 mg in am and 10 mg in pm     ondansetron (ZOFRAN-ODT) 8 MG disintegrating tablet Take 1 tablet (8 mg total) by mouth every 8 (eight) hours as needed for nausea. 60 tablet 2   oxymetazoline (NASAL RELIEF) 0.05 % nasal spray Place into the nose.     polyethylene glycol (MIRALAX / GLYCOLAX) 17 g packet Take 17 g by mouth daily as needed.     rosuvastatin (CRESTOR) 5 MG tablet Take 1 tablet (5 mg total) by mouth 2 (two) times a week. Sometimes 3 times a week 45 tablet 3   Simethicone (GAS-X PO) Take 250 mg by mouth daily as needed.     VASCEPA 1 g capsule Take 2 capsules (2 g total) by mouth 2 (two) times daily. 360 capsule 3   vitamin B-12 (CYANOCOBALAMIN) 1000 MCG tablet Take 1,000 mcg by mouth daily.     No current facility-administered medications for this visit.   No results found.  Review of Systems:   A ROS was performed including pertinent positives and negatives as documented in the HPI.  Physical Exam :   Constitutional: NAD and appears stated age Neurological: Alert and oriented Psych: Appropriate affect and cooperative There were no vitals taken for this visit.   Comprehensive Musculoskeletal Exam:    No significant tenderness noted over the fifth metacarpal or throughout the rest of the hand.  Flexion of the fifth finger remains somewhat limited, however this has improved some since last visit.  No evidence of erythema or ecchymosis.  Radial pulse 2+.  Neurosensory exam intact.  Imaging:   Xray (left hand 3 views): Oblique fifth metacarpal shaft fracture with  minimal displacement    I personally reviewed and interpreted the radiographs.   Assessment:   77 y.o. female 4 weeks status post fifth metacarpal shaft fracture.  She is overall doing better at today's visit with mild pain levels and better function.  X-rays taken today show minimal evidence of callus formation, however she is osteopenic and I suspect that this process will be a bit slower.  I would like her to remain in the ulnar gutter splint for a few more weeks.  Will continue current management plan for at least 3 more weeks until I see her back for follow-up assessment.  Plan :    -Remain in removable ulnar gutter splint -Return to clinic in 3 weeks for reassessment     I personally saw and evaluated the patient, and participated in the management and treatment plan.  Hazle Nordmann, PA-C Orthopedics

## 2022-10-25 ENCOUNTER — Inpatient Hospital Stay: Payer: Medicare Other | Attending: Internal Medicine

## 2022-10-25 DIAGNOSIS — K5989 Other specified functional intestinal disorders: Secondary | ICD-10-CM | POA: Diagnosis present

## 2022-10-25 DIAGNOSIS — Z7901 Long term (current) use of anticoagulants: Secondary | ICD-10-CM | POA: Diagnosis not present

## 2022-10-25 DIAGNOSIS — D3A8 Other benign neuroendocrine tumors: Secondary | ICD-10-CM

## 2022-10-25 DIAGNOSIS — M109 Gout, unspecified: Secondary | ICD-10-CM | POA: Diagnosis not present

## 2022-10-25 DIAGNOSIS — Z79899 Other long term (current) drug therapy: Secondary | ICD-10-CM | POA: Insufficient documentation

## 2022-10-25 DIAGNOSIS — R600 Localized edema: Secondary | ICD-10-CM | POA: Diagnosis not present

## 2022-10-25 DIAGNOSIS — Z86711 Personal history of pulmonary embolism: Secondary | ICD-10-CM | POA: Insufficient documentation

## 2022-10-25 DIAGNOSIS — K449 Diaphragmatic hernia without obstruction or gangrene: Secondary | ICD-10-CM | POA: Diagnosis not present

## 2022-10-25 DIAGNOSIS — R911 Solitary pulmonary nodule: Secondary | ICD-10-CM | POA: Insufficient documentation

## 2022-10-25 LAB — CBC WITH DIFFERENTIAL (CANCER CENTER ONLY)
Abs Immature Granulocytes: 0.02 10*3/uL (ref 0.00–0.07)
Basophils Absolute: 0.1 10*3/uL (ref 0.0–0.1)
Basophils Relative: 2 %
Eosinophils Absolute: 0.3 10*3/uL (ref 0.0–0.5)
Eosinophils Relative: 4 %
HCT: 39.7 % (ref 36.0–46.0)
Hemoglobin: 12.8 g/dL (ref 12.0–15.0)
Immature Granulocytes: 0 %
Lymphocytes Relative: 25 %
Lymphs Abs: 1.9 10*3/uL (ref 0.7–4.0)
MCH: 29 pg (ref 26.0–34.0)
MCHC: 32.2 g/dL (ref 30.0–36.0)
MCV: 89.8 fL (ref 80.0–100.0)
Monocytes Absolute: 0.6 10*3/uL (ref 0.1–1.0)
Monocytes Relative: 8 %
Neutro Abs: 4.6 10*3/uL (ref 1.7–7.7)
Neutrophils Relative %: 61 %
Platelet Count: 294 10*3/uL (ref 150–400)
RBC: 4.42 MIL/uL (ref 3.87–5.11)
RDW: 15.4 % (ref 11.5–15.5)
WBC Count: 7.5 10*3/uL (ref 4.0–10.5)
nRBC: 0 % (ref 0.0–0.2)

## 2022-10-25 LAB — CMP (CANCER CENTER ONLY)
ALT: 24 U/L (ref 0–44)
AST: 23 U/L (ref 15–41)
Albumin: 4.1 g/dL (ref 3.5–5.0)
Alkaline Phosphatase: 68 U/L (ref 38–126)
Anion gap: 9 (ref 5–15)
BUN: 20 mg/dL (ref 8–23)
CO2: 29 mmol/L (ref 22–32)
Calcium: 9.4 mg/dL (ref 8.9–10.3)
Chloride: 102 mmol/L (ref 98–111)
Creatinine: 1.1 mg/dL — ABNORMAL HIGH (ref 0.44–1.00)
GFR, Estimated: 51 mL/min — ABNORMAL LOW (ref >60)
Glucose, Bld: 129 mg/dL — ABNORMAL HIGH (ref 70–99)
Potassium: 4 mmol/L (ref 3.5–5.1)
Sodium: 140 mmol/L (ref 135–145)
Total Bilirubin: 0.6 mg/dL (ref 0.3–1.2)
Total Protein: 7.5 g/dL (ref 6.5–8.1)

## 2022-10-25 LAB — LACTATE DEHYDROGENASE: LDH: 137 U/L (ref 98–192)

## 2022-10-26 LAB — CHROMOGRANIN A: Chromogranin A (ng/mL): 1488 ng/mL — ABNORMAL HIGH (ref 0.0–101.8)

## 2022-10-27 ENCOUNTER — Other Ambulatory Visit: Payer: Self-pay | Admitting: *Deleted

## 2022-10-27 DIAGNOSIS — C7A019 Malignant carcinoid tumor of the small intestine, unspecified portion: Secondary | ICD-10-CM

## 2022-10-27 DIAGNOSIS — K5989 Other specified functional intestinal disorders: Secondary | ICD-10-CM | POA: Diagnosis not present

## 2022-10-31 ENCOUNTER — Ambulatory Visit (HOSPITAL_BASED_OUTPATIENT_CLINIC_OR_DEPARTMENT_OTHER): Payer: Medicare Other | Admitting: Cardiology

## 2022-10-31 LAB — 5 HIAA, QUANTITATIVE, URINE, 24 HOUR
5-HIAA, Ur: 3.2 mg/L
5-HIAA,Quant.,24 Hr Urine: 3.8 mg/(24.h) (ref 0.0–14.9)
Total Volume: 1200

## 2022-11-01 ENCOUNTER — Ambulatory Visit: Payer: Medicare Other | Admitting: Internal Medicine

## 2022-11-01 ENCOUNTER — Inpatient Hospital Stay (HOSPITAL_BASED_OUTPATIENT_CLINIC_OR_DEPARTMENT_OTHER): Payer: Medicare Other | Admitting: Internal Medicine

## 2022-11-01 ENCOUNTER — Ambulatory Visit: Payer: Medicare Other | Admitting: Physician Assistant

## 2022-11-01 VITALS — BP 136/62 | HR 84 | Temp 98.4°F | Resp 15 | Ht 61.0 in | Wt 144.8 lb

## 2022-11-01 DIAGNOSIS — D3A8 Other benign neuroendocrine tumors: Secondary | ICD-10-CM | POA: Diagnosis not present

## 2022-11-01 DIAGNOSIS — K5989 Other specified functional intestinal disorders: Secondary | ICD-10-CM | POA: Diagnosis not present

## 2022-11-01 NOTE — Progress Notes (Signed)
[facility] [phone]   [phone]) [phone]  OFFICE PROGRESS NOTE  Pincus Sanes, MD [address] 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  PRIOR THERAPY: None  CURRENT THERAPY: Xarelto 15 mg p.o. twice daily for 3 weeks followed by 20 mg p.o. daily.  INTERVAL HISTORY: [name] 77 y.o. female   Discussed the use of AI scribe software for clinical note transcription with the patient, who gave verbal consent to proceed.  History of Present Illness   The patient, with a history of a suspected neuroendocrine tumor, presents for a follow-up visit after a recent PET scan. She reports no new complaints since the last visit. The patient had been concerned about potential liver cancer after a PET scan showed activity in the liver, but subsequent reports indicated this was normal liver activity. The patient decided against further consultation with a radiologist or a visit to [facility], as there was no change in her condition.  The patient has been experiencing daily nausea, managed with ondansetron, and occasional severe stomach pain, which seems to improve with Benadryl, chamomile tea, and heat application. She has also noticed that certain foods, particularly beef and high-fiber items, exacerbate her symptoms. She has been taking pancreatic enzymes with meals to aid digestion.  The patient also reports a diagnosis of a large hiatal hernia, which her doctor has suggested may require surgery. However, the patient is hesitant due to her age and the potential for a difficult recovery. She has been experiencing muscle cramps, for which she has started taking magnesium, and recently had a bout of gout, which she has managed by avoiding shrimp.  The patient has noticed an increase in weight  and has been experiencing issues with balance. She has also been dealing with swelling in the left ankle and foot, which has been ongoing for several years. Despite these issues, the patient's lab results have improved since her last visit.        MEDICAL HISTORY: Past Medical History:  Diagnosis Date   ALLERGIC RHINITIS    ANEMIA-NOS    Arthritis    ASTHMA    Breast discharge    Carpal tunnel syndrome    COLONIC POLYPS, HX OF    Coronary artery disease    "mild CAD" noted on 12/05/17 in coronary CT scan   DIABETES MELLITUS, TYPE II    diet controlled   GERD    Hand tingling    HYPERLIPIDEMIA    HYPERTENSION    Neuroendocrine tumor    OSTEOPENIA    PONV (postoperative nausea and vomiting)    " it relaxes my bladder muscles and I have been known to pee all over the place"   Psoriasis    severe, began soriatane 01/2012   Pulmonary embolism (HCC)    Rectal fissure    Scoliosis    Wears glasses     ALLERGIES:  is allergic to phenergan [promethazine hcl], benicar [olmesartan], repatha [evolocumab], amlodipine, clarithromycin, codeine, erythromycin, hydralazine hcl, statins, and tetracycline.  MEDICATIONS:  Current Outpatient Medications  Medication Sig Dispense Refill   Ascorbic Acid (VITAMIN C GUMMIE PO) Take 282 mg by mouth every evening. Each tablet 141 mg     calcium carbonate (TUMS EX) 750 MG chewable tablet Chew 750 mg by mouth daily.     Cholecalciferol (  VITAMIN D3 GUMMIES ADULT PO) Take 2,000 Units by mouth every evening.     clindamycin (CLEOCIN T) 1 % external solution Apply 1 application topically daily as needed (wounds).      clobetasol (TEMOVATE) 0.05 % external solution Apply topically daily.     Coenzyme Q10 (COQ10 GUMMIES ADULT PO) Take 200 mg by mouth in the morning and at bedtime.     CREON 36000-114000 units CPEP capsule Take 36,000 Units by mouth 3 (three) times daily before meals.     diphenhydrAMINE (BENADRYL) 25 MG tablet Take 25 mg by mouth at  bedtime.     ELIQUIS 5 MG TABS tablet TAKE 1 TABLET BY MOUTH TWICE A DAY 180 tablet 2   FINACEA 15 % gel Apply topically 2 (two) times daily.     folic acid (FOLVITE) 1 MG tablet TAKE 1 TABLET BY MOUTH EVERY DAY 90 tablet 1   glucose blood (ONETOUCH ULTRA) test strip TEST DAILY AND AS NEEDED 300 strip 1   halobetasol (ULTRAVATE) 0.05 % cream APPLY TO AFFECTED AREA TWICE A DAY     lisinopril (ZESTRIL) 10 MG tablet TAKE 1 TABLET BY MOUTH EVERY DAY 90 tablet 1   magnesium oxide (MAG-OX) 400 MG tablet Take 1 tablet (400 mg total) by mouth daily. 90 tablet 1   Multiple Vitamins-Minerals (ADULT GUMMY PO) Take 1 tablet by mouth every evening. Children [employer] gummie formula     omeprazole (PRILOSEC OTC) 20 MG tablet Take 20 mg by mouth daily. Takes 10 mg in am and 10 mg in pm     ondansetron (ZOFRAN-ODT) 8 MG disintegrating tablet Take 1 tablet (8 mg total) by mouth every 8 (eight) hours as needed for nausea. 60 tablet 2   oxymetazoline (NASAL RELIEF) 0.05 % nasal spray Place into the nose.     polyethylene glycol (MIRALAX / GLYCOLAX) 17 g packet Take 17 g by mouth daily as needed.     rosuvastatin (CRESTOR) 5 MG tablet Take 1 tablet (5 mg total) by mouth 2 (two) times a week. Sometimes 3 times a week 45 tablet 3   Simethicone (GAS-X PO) Take 250 mg by mouth daily as needed.     VASCEPA 1 g capsule Take 2 capsules (2 g total) by mouth 2 (two) times daily. 360 capsule 3   vitamin B-12 (CYANOCOBALAMIN) 1000 MCG tablet Take 1,000 mcg by mouth daily.     No current facility-administered medications for this visit.    SURGICAL HISTORY:  Past Surgical History:  Procedure Laterality Date   benign rectal growth  2004   removed by Dr. Abbey Chatters   BIOPSY  08/09/2018   Procedure: BIOPSY;  Surgeon: [name], MD;  Location[facility] END[facility];  Service: Endoscopy;;   BIOPSY  08/22/2018   Procedure: BIOPSY;  Surgeon: Charna Elizabeth, MD;  Location: [facility] ENDOSCOPY;  Service: Endoscopy;;   BIOPSY  03/07/2019    Procedure: BIOPSY;  Surgeon: Lemar Lofty., MD;  Location: [facility] ENDOSCOPY;  Service: Gastroenterology;;   BIOPSY  08/06/2020   Procedure: BIOPSY;  Surgeon: Lemar Lofty., MD;  Location: [facility] ENDOSCOPY;  Service: Gastroenterology;;   BREAST BIOPSY Right    BREAST EXCISIONAL BIOPSY Left    BREAST SURGERY  1988   biopsy   CESAREAN SECTION     COLONOSCOPY N/A 08/22/2018   Procedure: COLONOSCOPY;  Surgeon: Charna Elizabeth, MD;  Location: [facility] ENDOSCOPY;  Service: Endoscopy;  Laterality: N/A;   ENDOSCOPIC MUCOSAL RESECTION N/A 03/07/2019   Procedure: ENDOSCOPIC MUCOSAL RESECTION;  Surgeon: Lemar Lofty., MD;  Location: [facility] ENDOSCOPY;  Service: Gastroenterology;  Laterality: N/A;   ESOPHAGOGASTRODUODENOSCOPY (EGD) WITH PROPOFOL N/A 08/04/2018   Procedure: ESOPHAGOGASTRODUODENOSCOPY (EGD) WITH PROPOFOL;  Surgeon: Meryl Dare, MD;  Location: [facility] ENDOSCOPY;  Service: Gastroenterology;  Laterality: N/A;   ESOPHAGOGASTRODUODENOSCOPY (EGD) WITH PROPOFOL N/A 03/07/2019   Procedure: ESOPHAGOGASTRODUODENOSCOPY (EGD) WITH PROPOFOL;  Surgeon: Meridee Score Netty Starring., MD;  Location: [facility] ENDOSCOPY;  Service: Gastroenterology;  Laterality: N/A;   ESOPHAGOGASTRODUODENOSCOPY (EGD) WITH PROPOFOL N/A 08/06/2020   Procedure: ESOPHAGOGASTRODUODENOSCOPY (EGD) WITH PROPOFOL;  Surgeon: Meridee Score Netty Starring., MD;  Location: [facility] ENDOSCOPY;  Service: Gastroenterology;  Laterality: N/A;   EUS N/A 03/07/2019   Procedure: UPPER ENDOSCOPIC ULTRASOUND (EUS) RADIAL;  Surgeon: Lemar Lofty., MD;  Location: [facility] ENDOSCOPY;  Service: Gastroenterology;  Laterality: N/A;   EUS N/A 08/06/2020   Procedure: UPPER ENDOSCOPIC ULTRASOUND (EUS) RADIAL;  Surgeon: Lemar Lofty., MD;  Location: [facility] ENDOSCOPY;  Service: Gastroenterology;  Laterality: N/A;   FINE NEEDLE ASPIRATION  03/07/2019   Procedure: FINE NEEDLE ASPIRATION (FNA) LINEAR;  Surgeon: Lemar Lofty., MD;  Location: [facility] ENDOSCOPY;  Service:  Gastroenterology;;   FINE NEEDLE ASPIRATION  08/06/2020   Procedure: FINE NEEDLE ASPIRATION;  Surgeon: Lemar Lofty., MD;  Location: [facility] ENDOSCOPY;  Service: Gastroenterology;;   Wenda Low SIGMOIDOSCOPY N/A 08/04/2018   Procedure: Arnell Sieving;  Surgeon: Meryl Dare, MD;  Location: [facility] ENDOSCOPY;  Service: Gastroenterology;  Laterality: N/A;   FLEXIBLE SIGMOIDOSCOPY N/A 08/09/2018   Procedure: FLEXIBLE SIGMOIDOSCOPY;  Surgeon: [name], MD;  Location: [facility] ENDOSCOPY;  Service: Endoscopy;  Laterality: N/A;   HEMOSTASIS CLIP PLACEMENT  03/07/2019   Procedure: HEMOSTASIS CLIP PLACEMENT;  Surgeon: Meridee Score Netty Starring., MD;  Location: [facility] ENDOSCOPY;  Service: Gastroenterology;;   SUBMUCOSAL LIFTING INJECTION  03/07/2019   Procedure: SUBMUCOSAL LIFTING INJECTION;  Surgeon: Lemar Lofty., MD;  Location: [facility] ENDOSCOPY;  Service: Gastroenterology;;   VIDEO BRONCHOSCOPY WITH ENDOBRONCHIAL NAVIGATION N/A 03/14/2018   Procedure: VIDEO BRONCHOSCOPY WITH ENDOBRONCHIAL NAVIGATION;  Surgeon: Leslye Peer, MD;  Location: [facility] OR;  Service: Thoracic;  Laterality: N/A;    REVIEW OF SYSTEMS:  Constitutional: positive for fatigue Eyes: negative Ears, nose, mouth, throat, and face: negative Respiratory: negative Cardiovascular: negative Gastrointestinal: positive for constipation and nausea Genitourinary:negative Integument/breast: negative Hematologic/lymphatic: negative Musculoskeletal:negative Neurological: negative Behavioral/Psych: negative Endocrine: negative Allergic/Immunologic: negative   PHYSICAL EXAMINATION: General appearance: alert, cooperative, fatigued, and no distress Head: Normocephalic, without obvious abnormality, atraumatic Neck: no adenopathy, no JVD, supple, symmetrical, trachea midline, and thyroid not enlarged, symmetric, no tenderness/mass/nodules Lymph nodes: Cervical, supraclavicular, and axillary nodes normal. Resp: clear to auscultation  bilaterally Back: symmetric, no curvature. ROM normal. No CVA tenderness. Cardio: regular rate and rhythm, S1, S2 normal, no murmur, click, rub or gallop GI: soft, non-tender; bowel sounds normal; no masses,  no organomegaly Extremities: extremities normal, atraumatic, no cyanosis or edema Neurologic: Alert and oriented X 3, normal strength and tone. Normal symmetric reflexes. Normal coordination and gait  ECOG PERFORMANCE STATUS: 1 - Symptomatic but completely ambulatory  Blood pressure 136/62, pulse 84, temperature 98.4 F (36.9 C), temperature source Oral, resp. rate 15, height 5\' 1"  (1.549 m), weight 144 lb 12.8 oz (65.7 kg), SpO2 98%.  LABORATORY DATA: Lab Results  Component Value Date   WBC 7.5 10/25/2022   HGB 12.8 10/25/2022   HCT 39.7 10/25/2022   MCV 89.8 10/25/2022   PLT 294 10/25/2022      Chemistry      Component Value Date/Time  NA 140 10/25/2022 0908   NA 141 09/03/2018 0824   K 4.0 10/25/2022 0908   CL 102 10/25/2022 0908   CO2 29 10/25/2022 0908   BUN 20 10/25/2022 0908   BUN 8 09/03/2018 0824   CREATININE 1.10 (H) 10/25/2022 0908   CREATININE 1.08 (H) 11/05/2019 0844      Component Value Date/Time   CALCIUM 9.4 10/25/2022 0908   ALKPHOS 68 10/25/2022 0908   AST 23 10/25/2022 0908   ALT 24 10/25/2022 0908   BILITOT 0.6 10/25/2022 0908       RADIOGRAPHIC STUDIES: DG Hand Complete Left  Result Date: 10/26/2022 CLINICAL DATA:  Follow up fracture. EXAM: LEFT HAND - COMPLETE 3 VIEW COMPARISON:  09/19/2022. FINDINGS: Osseous structures are osteopenic. Healing fracture of the distal cortex fifth metacarpal with callus formation. Osseous structures are otherwise intact. Sclerosis at the base of the thumb consistent with osteoarthritis. IMPRESSION: Osteopenia. Osteoarthritis base of the thumb. Healing nondisplaced fracture fifth metacarpal. Electronically Signed   By: Layla Maw M.D.   On: 10/26/2022 09:26     ASSESSMENT AND PLAN: This is a very  pleasant 76 years old white female with suspicious carcinoid tumor of the duodenum as well as a small pulmonary nodules.  The patient has no clear pathology confirming her diagnosis but highly suspicious on the imaging studies. She had repeat PET dotatate scan on December 14, 2020 that showed focal radiotracer activity localized in the second portion of the duodenum corresponding to enhancing lesion on the comparison CT and consistent with a well-differentiated small neuroendocrine tumor.  There was no evidence of metastatic adenopathy in the abdomen and pelvis and no liver or mesenteric metastasis.  The patient had new bilateral pulmonary nodularity with discrete nodule in the right lung and peribronchial thickening and small nodularity in the right middle lobe and lingula but no activity associated with these lesions. She is followed by Dr. Delton Coombes from pulmonary medicine. She was seen recently at [facility] by Dr. Reginia Naas as well as a GI surgeon Dr. Derek Mound and both of them recommended for her to continue on observation.    Suspected Neuroendocrine Tumor No definitive diagnosis despite multiple attempts. Chromogranin A levels fluctuating, but currently decreasing. No symptoms of hot flashes, night sweats, or diarrhea. -Continue current management and monitoring of symptoms. -Repeat blood work in 6 months.  Gastrointestinal Symptoms Daily nausea managed with ondansetron. Occasional severe stomach pain relieved by Benadryl, hot chamomile tea, and heat application. -Continue current management and monitoring of symptoms.  Hiatal Hernia Large, asymptomatic hiatal hernia. Patient not interested in surgical intervention at this time. -Continue monitoring for symptoms.  Lower Extremity Edema Chronic left-sided ankle and foot swelling. -Continue monitoring for changes or worsening of symptoms.  Balance Issues Recent worsening of balance, possibly requiring ear adjustment. -Consider  referral for balance assessment if symptoms persist or worsen.  Gout Recent episode of gout managed with cortisone shot and dietary changes. -Continue current management and monitoring of symptoms.  Follow-up in 6 months with labs to be done 1 week prior to appointment.      The patient is very anxious and worried about her condition and wanted to see Dr. Reginia Naas again at [facility] for reevaluation and recommendation regarding her condition.  The patient voices understanding of current disease status and treatment options and is in agreement with the current care plan.  All questions were answered. The patient knows to call the clinic with any problems, questions or concerns. We can  certainly see the patient much sooner if necessary. The total time spent in the appointment was 30 minutes.   Disclaimer: This note was dictated with voice recognition software. Similar sounding words can inadvertently be transcribed and may not be corrected upon review.

## 2022-11-03 ENCOUNTER — Ambulatory Visit
Admission: RE | Admit: 2022-11-03 | Discharge: 2022-11-03 | Disposition: A | Discharge status: Home / Self Care | Payer: Medicare Other | Source: Ambulatory Visit | Attending: Internal Medicine | Admitting: Internal Medicine

## 2022-11-03 ENCOUNTER — Other Ambulatory Visit: Payer: Medicare Other

## 2022-11-03 ENCOUNTER — Ambulatory Visit: Payer: Medicare Other | Admitting: Internal Medicine

## 2022-11-03 ENCOUNTER — Ambulatory Visit: Payer: Medicare Other

## 2022-11-03 DIAGNOSIS — Z1231 Encounter for screening mammogram for malignant neoplasm of breast: Secondary | ICD-10-CM

## 2022-11-08 ENCOUNTER — Other Ambulatory Visit (HOSPITAL_BASED_OUTPATIENT_CLINIC_OR_DEPARTMENT_OTHER): Payer: Self-pay

## 2022-11-08 ENCOUNTER — Encounter (HOSPITAL_BASED_OUTPATIENT_CLINIC_OR_DEPARTMENT_OTHER): Payer: Self-pay | Admitting: Student

## 2022-11-08 ENCOUNTER — Ambulatory Visit (HOSPITAL_BASED_OUTPATIENT_CLINIC_OR_DEPARTMENT_OTHER): Payer: Medicare Other | Admitting: Student

## 2022-11-08 ENCOUNTER — Ambulatory Visit (HOSPITAL_BASED_OUTPATIENT_CLINIC_OR_DEPARTMENT_OTHER): Payer: Medicare Other

## 2022-11-08 DIAGNOSIS — S62327D Displaced fracture of shaft of fifth metacarpal bone, left hand, subsequent encounter for fracture with routine healing: Secondary | ICD-10-CM

## 2022-11-08 MED ORDER — COVID-19 MRNA VAC-TRIS(PFIZER) 30 MCG/0.3ML IM SUSY
0.3000 mL | PREFILLED_SYRINGE | Freq: Once | INTRAMUSCULAR | 0 refills | Status: AC
  Filled 2022-11-08: qty 0.3, 1d supply, fill #0

## 2022-11-08 NOTE — Progress Notes (Signed)
Chief Complaint: Left hand pain     History of Present Illness:   11/08/22: [name] is here today for follow-up of a left fifth metatarsal shaft fracture that occurred on 09/17/2022.  Overall she is continued to improve since last visit.  Denies any consistent pain other than a very occasional sharp, stabbing sensation which occurs more on the thumb side.  She has been coming out of the removable splint some and her range of motion is improving.  Still wears the splint at night, while driving, and when leaving the house.  Denies any other changes in symptoms.   09/19/22: [name] is a 77 y.o. female presenting today for evaluation of pain in her left hand.  This began 2 days ago after she sustained a fall just prior to leaving from the beach.  Patient was dragging a cooler down a flight of stairs backwards when she missed the last 1-2 stairs.  She is unsure of exactly how her hand was injured during the fall.  Most of the pain is located on the ulnar aspect of the hand and reports today that it is mild in severity.  She is on 5 mg Eliquis and has bruising extending throughout the hand.  She has taken some Tylenol for pain.  Did have some numbness located along the ulnar aspect of the fifth metacarpal which has improved.   Surgical History:   None  PMH/PSH/Family History/Social History/Meds/Allergies:    Past Medical History:  Diagnosis Date   ALLERGIC RHINITIS    ANEMIA-NOS    Arthritis    ASTHMA    Breast discharge    Carpal tunnel syndrome    COLONIC POLYPS, HX OF    Coronary artery disease    "mild CAD" noted on 12/05/17 in coronary CT scan   DIABETES MELLITUS, TYPE II    diet controlled   GERD    Hand tingling    HYPERLIPIDEMIA    HYPERTENSION    Neuroendocrine tumor    OSTEOPENIA    PONV (postoperative nausea and vomiting)    " it relaxes my bladder muscles and I have been known to pee all over the place"   Psoriasis    severe,  began soriatane 01/2012   Pulmonary embolism (HCC)    Rectal fissure    Scoliosis    Wears glasses    Past Surgical History:  Procedure Laterality Date   benign rectal growth  2004   removed by Dr. Abbey Chatters   BIOPSY  08/09/2018   Procedure: BIOPSY;  Surgeon: [name], MD;  Location: [facility] ENDOSCOPY;  Service: Endoscopy;;   BIOPSY  08/22/2018   Procedure: BIOPSY;  Surgeon: Charna Elizabeth, MD;  Location: [facility] ENDOSCOPY;  Service: Endoscopy;;   BIOPSY  03/07/2019   Procedure: BIOPSY;  Surgeon: Lemar Lofty., MD;  Location: [facility] ENDOSCOPY;  Service: Gastroenterology;;   BIOPSY  08/06/2020   Procedure: BIOPSY;  Surgeon: Lemar Lofty., MD;  Location: [facility] ENDOSCOPY;  Service: Gastroenterology;;   BREAST BIOPSY Right    BREAST EXCISIONAL BIOPSY Left    BREAST SURGERY  1988   biopsy   CESAREAN SECTION     COLONOSCOPY N/A 08/22/2018   Procedure: COLONOSCOPY;  Surgeon: Charna Elizabeth, MD;  Location: [facility] ENDOSCOPY;  Service: Endoscopy;  Laterality: N/A;   ENDOSCOPIC MUCOSAL RESECTION N/A  03/07/2019   Procedure: ENDOSCOPIC MUCOSAL RESECTION;  Surgeon: Meridee Score Netty Starring., MD;  Location: [facility] ENDOSCOPY;  Service: Gastroenterology;  Laterality: N/A;   ESOPHAGOGASTRODUODENOSCOPY (EGD) WITH PROPOFOL N/A 08/04/2018   Procedure: ESOPHAGOGASTRODUODENOSCOPY (EGD) WITH PROPOFOL;  Surgeon: Meryl Dare, MD;  Location: [facility] ENDOSCOPY;  Service: Gastroenterology;  Laterality: N/A;   ESOPHAGOGASTRODUODENOSCOPY (EGD) WITH PROPOFOL N/A 03/07/2019   Procedure: ESOPHAGOGASTRODUODENOSCOPY (EGD) WITH PROPOFOL;  Surgeon: Meridee Score Netty Starring., MD;  Location: [facility] ENDOSCOPY;  Service: Gastroenterology;  Laterality: N/A;   ESOPHAGOGASTRODUODENOSCOPY (EGD) WITH PROPOFOL N/A 08/06/2020   Procedure: ESOPHAGOGASTRODUODENOSCOPY (EGD) WITH PROPOFOL;  Surgeon: Meridee Score Netty Starring., MD;  Location: [facility] ENDOSCOPY;  Service: Gastroenterology;  Laterality: N/A;   EUS N/A 03/07/2019   Procedure: UPPER ENDOSCOPIC  ULTRASOUND (EUS) RADIAL;  Surgeon: Lemar Lofty., MD;  Location: [facility] ENDOSCOPY;  Service: Gastroenterology;  Laterality: N/A;   EUS N/A 08/06/2020   Procedure: UPPER ENDOSCOPIC ULTRASOUND (EUS) RADIAL;  Surgeon: Lemar Lofty., MD;  Location: [facility] ENDOSCOPY;  Service: Gastroenterology;  Laterality: N/A;   FINE NEEDLE ASPIRATION  03/07/2019   Procedure: FINE NEEDLE ASPIRATION (FNA) LINEAR;  Surgeon: Lemar Lofty., MD;  Location: [facility] ENDOSCOPY;  Service: Gastroenterology;;   FINE NEEDLE ASPIRATION  08/06/2020   Procedure: FINE NEEDLE ASPIRATION;  Surgeon: Lemar Lofty., MD;  Location: [facility] ENDOSCOPY;  Service: Gastroenterology;;   Wenda Low SIGMOIDOSCOPY N/A 08/04/2018   Procedure: Arnell Sieving;  Surgeon: Meryl Dare, MD;  Location: [facility] ENDOSCOPY;  Service: Gastroenterology;  Laterality: N/A;   FLEXIBLE SIGMOIDOSCOPY N/A 08/09/2018   Procedure: FLEXIBLE SIGMOIDOSCOPY;  Surgeon: [name], MD;  Location: [facility] ENDOSCOPY;  Service: Endoscopy;  Laterality: N/A;   HEMOSTASIS CLIP PLACEMENT  03/07/2019   Procedure: HEMOSTASIS CLIP PLACEMENT;  Surgeon: Lemar Lofty., MD;  Location: [facility] ENDOSCOPY;  Service: Gastroenterology;;   SUBMUCOSAL LIFTING INJECTION  03/07/2019   Procedure: SUBMUCOSAL LIFTING INJECTION;  Surgeon: Lemar Lofty., MD;  Location: [facility] ENDOSCOPY;  Service: Gastroenterology;;   VIDEO BRONCHOSCOPY WITH ENDOBRONCHIAL NAVIGATION N/A 03/14/2018   Procedure: VIDEO BRONCHOSCOPY WITH ENDOBRONCHIAL NAVIGATION;  Surgeon: Leslye Peer, MD;  Location: [facility] OR;  Service: Thoracic;  Laterality: N/A;   Social History   Socioeconomic History   Marital status: Single    Spouse name: Not on file   Number of children: 1   Years of education: 16   Highest education level: Bachelor's degree (e.g., BA, AB, BS)  Occupational History   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  Has one child and one grandchild.  Retired Development worker, international aid.  Education: college.    Social Determinants of Health   Financial Resource Strain: Low Risk  (10/07/2022)   Overall Financial Resource Strain (CARDIA)    Difficulty of Paying Living Expenses: Not hard at all  Food Insecurity: No Food Insecurity (10/07/2022)   Hunger Vital Sign  Worried About Programme researcher, broadcasting/film/video in the Last Year: Never true    Ran Out of Food in the Last Year: Never true  Transportation Needs: No Transportation Needs (10/07/2022)   PRAPARE - Administrator, Civil Service (Medical): No    Lack of Transportation (Non-Medical): No  Physical Activity: Inactive (10/07/2022)   Exercise Vital Sign    Days of Exercise per Week: 0 days    Minutes of Exercise per Session: 0 min  Stress: No Stress Concern Present (10/07/2022)   [employer] of Occupational Health - Occupational Stress Questionnaire    Feeling of Stress : Only a little  Social Connections: Socially Isolated (10/07/2022)   Social Connection and Isolation Panel [NHANES]    Frequency of Communication with Friends and Family: More than three times a week    Frequency of Social Gatherings with Friends and Family: Once a week    Attends Religious Services: Never    Database administrator or Organizations: No    Attends Banker Meetings: Never    Marital Status: Widowed   Family History  Problem Relation Age of Onset   Lung cancer Father    Arthritis  Other        Parents   Asthma Other        parent, other relative   Breast cancer Other        other relative   Hypertension Other        parent, other relative   Heart disease Other        parent, other relative   Heart disease Mother    Asthma Mother    Breast cancer Maternal Aunt 26   Parkinson's disease Maternal Grandmother    Rheumatic fever Maternal Grandfather    Allergies  Allergen Reactions   Phenergan [Promethazine Hcl] Shortness Of Breath and Anxiety   Benicar [Olmesartan] Other (See Comments)    Colitis-sprue enteropathy   Repatha [Evolocumab]     Muscle pain and weakness   Amlodipine Swelling   Clarithromycin Nausea And Vomiting    May have episodes of syncope due to prolonged vomiting    Codeine Nausea And Vomiting    May have episodes of syncope due to prolonged vomiting   Erythromycin Nausea And Vomiting   Hydralazine Hcl     Drug-induced lupus - pt wishes to avoid   Statins     Muscle pain    Tetracycline Nausea And Vomiting   Current Outpatient Medications  Medication Sig Dispense Refill   Ascorbic Acid (VITAMIN C GUMMIE PO) Take 282 mg by mouth every evening. Each tablet 141 mg     calcium carbonate (TUMS EX) 750 MG chewable tablet Chew 750 mg by mouth daily.     Cholecalciferol (VITAMIN D3 GUMMIES ADULT PO) Take 2,000 Units by mouth every evening.     clindamycin (CLEOCIN T) 1 % external solution Apply 1 application topically daily as needed (wounds).      clobetasol (TEMOVATE) 0.05 % external solution Apply topically daily.     Coenzyme Q10 (COQ10 GUMMIES ADULT PO) Take 200 mg by mouth in the morning and at bedtime.     COVID-19 mRNA vaccine, Pfizer, (COMIRNATY) syringe Inject 0.3 mLs into the muscle once for 1 dose. 0.3 mL 0   CREON 36000-114000 units CPEP capsule Take 36,000 Units by mouth 3 (three) times daily before meals.     diphenhydrAMINE (BENADRYL) 25 MG tablet Take 25 mg by mouth at bedtime.  ELIQUIS 5 MG TABS tablet TAKE 1 TABLET BY  MOUTH TWICE A DAY 180 tablet 2   FINACEA 15 % gel Apply topically 2 (two) times daily.     folic acid (FOLVITE) 1 MG tablet TAKE 1 TABLET BY MOUTH EVERY DAY 90 tablet 1   glucose blood (ONETOUCH ULTRA) test strip TEST DAILY AND AS NEEDED 300 strip 1   halobetasol (ULTRAVATE) 0.05 % cream APPLY TO AFFECTED AREA TWICE A DAY     lisinopril (ZESTRIL) 10 MG tablet TAKE 1 TABLET BY MOUTH EVERY DAY 90 tablet 1   magnesium oxide (MAG-OX) 400 MG tablet Take 1 tablet (400 mg total) by mouth daily. 90 tablet 1   Multiple Vitamins-Minerals (ADULT GUMMY PO) Take 1 tablet by mouth every evening. Children [employer] gummie formula     omeprazole (PRILOSEC OTC) 20 MG tablet Take 20 mg by mouth daily. Takes 10 mg in am and 10 mg in pm     ondansetron (ZOFRAN-ODT) 8 MG disintegrating tablet Take 1 tablet (8 mg total) by mouth every 8 (eight) hours as needed for nausea. 60 tablet 2   oxymetazoline (NASAL RELIEF) 0.05 % nasal spray Place into the nose.     polyethylene glycol (MIRALAX / GLYCOLAX) 17 g packet Take 17 g by mouth daily as needed.     rosuvastatin (CRESTOR) 5 MG tablet Take 1 tablet (5 mg total) by mouth 2 (two) times a week. Sometimes 3 times a week 45 tablet 3   Simethicone (GAS-X PO) Take 250 mg by mouth daily as needed.     VASCEPA 1 g capsule Take 2 capsules (2 g total) by mouth 2 (two) times daily. 360 capsule 3   vitamin B-12 (CYANOCOBALAMIN) 1000 MCG tablet Take 1,000 mcg by mouth daily.     No current facility-administered medications for this visit.   No results found.  Review of Systems:   A ROS was performed including pertinent positives and negatives as documented in the HPI.  Physical Exam :   Constitutional: NAD and appears stated age Neurological: Alert and oriented Psych: Appropriate affect and cooperative There were no vitals taken for this visit.   Comprehensive Musculoskeletal Exam:    No tenderness palpation throughout the left hand and wrist.  Patient is able to  perform a fist with good strength, however this does cause slight discomfort over the fifth metacarpal.  Active wrist ROM to 60 degrees extension and 80 degrees flexion.  Radial pulse 2+.  Neurosensory is intact.   Imaging:   Xray (left hand 3 views): Healing fifth metacarpal shaft fracture without further displacement.    I personally reviewed and interpreted the radiographs.   Assessment:   77 y.o. female now 8 weeks status post fifth metacarpal shaft fracture.  Today's radiographs do show improved callus and healing.  Given this as well as her improvement in symptoms of mobility, I believe she can continue weaning out of the splint as tolerated.  Encouraged for her to do this over the span of a few weeks, beginning with lighter intensity activities out of the splint with goal of being without the splint completely within 1 month.  At this point she can return to clinic as needed.  Plan :    -Return to clinic as needed     I personally saw and evaluated the patient, and participated in the management and treatment plan.  Hazle Nordmann, PA-C Orthopedics

## 2022-12-08 ENCOUNTER — Ambulatory Visit (INDEPENDENT_AMBULATORY_CARE_PROVIDER_SITE_OTHER): Payer: Medicare Other | Admitting: Cardiology

## 2022-12-08 ENCOUNTER — Other Ambulatory Visit (HOSPITAL_BASED_OUTPATIENT_CLINIC_OR_DEPARTMENT_OTHER): Payer: Self-pay

## 2022-12-08 ENCOUNTER — Encounter (HOSPITAL_BASED_OUTPATIENT_CLINIC_OR_DEPARTMENT_OTHER): Payer: Self-pay | Admitting: Cardiology

## 2022-12-08 VITALS — BP 124/88 | HR 75 | Ht 61.0 in | Wt 147.3 lb

## 2022-12-08 DIAGNOSIS — I2510 Atherosclerotic heart disease of native coronary artery without angina pectoris: Secondary | ICD-10-CM

## 2022-12-08 DIAGNOSIS — I700 Atherosclerosis of aorta: Secondary | ICD-10-CM | POA: Diagnosis not present

## 2022-12-08 DIAGNOSIS — I10 Essential (primary) hypertension: Secondary | ICD-10-CM

## 2022-12-08 DIAGNOSIS — E7800 Pure hypercholesterolemia, unspecified: Secondary | ICD-10-CM

## 2022-12-08 DIAGNOSIS — R0609 Other forms of dyspnea: Secondary | ICD-10-CM

## 2022-12-08 DIAGNOSIS — Z789 Other specified health status: Secondary | ICD-10-CM

## 2022-12-08 MED ORDER — FLUAD 0.5 ML IM SUSY
0.5000 mL | PREFILLED_SYRINGE | Freq: Once | INTRAMUSCULAR | 0 refills | Status: AC
  Filled 2022-12-08: qty 0.5, 1d supply, fill #0

## 2022-12-08 NOTE — Progress Notes (Signed)
Cardiology Office Note:  .    Date:  12/08/2022  ID:  [name], DOB 03-27-1945, MRN [id number] PCP: Pincus Sanes, MD  [facility] Providers Cardiologist:  Jodelle Red, MD     History of Present Illness: [name] is a 77 y.o. female with a hx of mild nonobstructive CAD, aortic atherosclerosis, PE, hypertension, hyperlipidemia, here for follow-up and to establish care with me. She is formerly a patient of Dr. Laurance Flatten, last seen by her 04/27/2022. At that visit she complained of episodes feeling "like she is holding her breath but she is not," usually triggered with light activity. Blood pressure was well controlled. Continued on rosuvastatin 5 mg 2x per week (unable to tolerate higher doses), continued vascepa 1g BID (patient decreased dose due to concern for bleeding). Also continued lisinopril 10 mg daily; had previously tried 5 mg due to low blood pressures but she developed swelling in her feet and went back up to 10 mg. BP was 130s systolic in the office after taking 10 mg lisinopril.  Was previously seen by Dr. 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 She complained of DOE. Myoview 05/2021 which was negative. TTE showed LVEF 60-65%, mild LVH, normal RV, no significant valve disease. In 10/2021, blood pressures were fluctuating. Lisinopril was continued with instructions to cut back on dosing if BP was too low.  Cardiovascular risk factors: Prior clinical ASCVD: Cardiac CTA 11/2017 with mild nonobstructive CAD.  Comorbid conditions: Hypertension - in the office her blood pressure is 124/88. Hyperlipidemia - Intolerant to pravastatin, atorvastatin, pitavastatin. She reports Repatha caused "paralysis".  Metabolic syndrome/Obesity:  Current weight 147 lbs. She reports that she is gaining weight, mostly due to her diet. She is considering hypnotherapy for weight loss.  Chronic inflammatory  conditions: None. Tobacco use history: Former smoker. Prior pertinent testing and/or incidental findings: Myoview 05/2021 which was negative. TTE 05/12/2021 showed LVEF 60-65%, mild LVH, normal RV, no significant valve disease. Exercise level: Not much formal exercise. When she is walking a lot she complains of "breathlessness" and fatigue. In the past she used to walk 3 miles a day, which gradually decreased to 2 then 1 mile. Currently she is able to walk maybe 1/2 mile before feeling short winded. She confirms that she would be able to keep going for a short time if needed. Additionally she complains of chronic back issues; no longer walks without a rollator, or using the shopping cart at the grocery store. She notes that her overall balance is worsening again. After bending over or when lying down she may develop some dizziness which she describes as "an off balance disorientation" rather than a movement/swirling. Current diet: She states she is struggling with willpower and dietary indiscretions. Occasionally she experiences a pain in her chest that she knows to be of gastric etiology since it resolves with OTC medication use (Tums, Gas-x, etc.).   Sometimes has LE edema, worse in left foot as it has appeared bigger in the past few years. When swelling seems more severe she will use compression hose.  She denies any palpitations, headaches, syncope, orthopnea, or PND.  ROS:  Please see the history of present illness. ROS otherwise negative except as noted.  (+) Exertional shortness of breath (+) Fatigue (+) Weight gain (+) Chronic back pain (+) Imbalance (+) Positional dizziness (+) Occasional epigastric pain (+) Intermittent LE edema  Studies Reviewed: .  Lexiscan Myoview  05/19/2021:   The study is normal. The study is low risk.   No ST deviation was noted.   Left ventricular function is normal. Nuclear stress EF: 85 %. The left ventricular ejection fraction is hyperdynamic  (>65%). End diastolic cavity size is normal.   Prior study available for comparison from 01/05/2016.  Echo  05/12/2021:  1. Left ventricular ejection fraction, by estimation, is 60 to 65%. The  left ventricle has normal function. The left ventricle has no regional  wall motion abnormalities. There is mild concentric left ventricular  hypertrophy. Left ventricular diastolic  parameters were normal.   2. Right ventricular systolic function is normal. The right ventricular  size is normal.   3. The mitral valve is normal in structure. No evidence of mitral valve  regurgitation. No evidence of mitral stenosis.   4. The aortic valve is normal in structure. Aortic valve regurgitation is  not visualized. No aortic stenosis is present.   5. The inferior vena cava is normal in size with greater than 50%  respiratory variability, suggesting right atrial pressure of 3 mmHg.   Physical Exam:    VS:  BP 124/88   Pulse 75   Ht 5\' 1"  (1.549 m)   Wt 147 lb 4.8 oz (66.8 kg)   SpO2 100%   BMI 27.83 kg/m    Wt Readings from Last 3 Encounters:  12/08/22 147 lb 4.8 oz (66.8 kg)  11/01/22 144 lb 12.8 oz (65.7 kg)  10/11/22 142 lb 9.6 oz (64.7 kg)    GEN: Well nourished, well developed in no acute distress HEENT: Normal, moist mucous membranes NECK: No JVD CARDIAC: regular rhythm, normal S1 and S2, no rubs or gallops. No murmur. VASCULAR: Radial and DP pulses 2+ bilaterally. No carotid bruits RESPIRATORY:  Clear to auscultation without rales, wheezing or rhonchi  ABDOMEN: Soft, non-tender, non-distended MUSCULOSKELETAL:  Ambulates independently SKIN: Warm and dry, no edema NEUROLOGIC:  Alert and oriented x 3. No focal neuro deficits noted. PSYCHIATRIC:  Normal affect   ASSESSMENT AND PLAN: .    Nonobstructive CAD Aortic atherosclerosis Hypercholesterolemia Statin, PCKS9i intolerance -myoview without ischemia, CT with nonobstructive disease -cannot tolerate other statins or repatha.  Tolerates rosuvastatin twice weekly  Dyspnea on exertion -nonlimiting, discussed increasing activity  Hypertension -has mild cough on lisinopril, but it is manageable. She has not tolerated several other meds in the past. She is ok with continuing the lisinopril for now, will contact us if cough worsens  History of PE, on lifelong anticoagulation -continue apixaban  Dispo: Follow-up in 6 months, or sooner as needed.  I,Mathew Stumpf,acting as a Neurosurgeon for [employer], MD.,have documented all relevant documentation on the behalf of Jodelle Red, MD,as directed by  Jodelle Red, MD while in the presence of Jodelle Red, MD.  I, Jodelle Red, MD, have reviewed all documentation for this visit. The documentation on 12/08/22 for the exam, diagnosis, procedures, and orders are all accurate and complete.   Signed, Jodelle Red, MD

## 2022-12-08 NOTE — Patient Instructions (Signed)

## 2022-12-15 ENCOUNTER — Encounter: Payer: Self-pay | Admitting: Emergency Medicine

## 2022-12-15 ENCOUNTER — Ambulatory Visit: Payer: Medicare Other | Admitting: Emergency Medicine

## 2022-12-15 VITALS — BP 128/70 | HR 95 | Temp 98.1°F | Ht 61.0 in | Wt 147.2 lb

## 2022-12-15 DIAGNOSIS — R918 Other nonspecific abnormal finding of lung field: Secondary | ICD-10-CM

## 2022-12-15 DIAGNOSIS — J301 Allergic rhinitis due to pollen: Secondary | ICD-10-CM

## 2022-12-15 NOTE — Assessment & Plan Note (Signed)
Stable on PET scan 07/2022.  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  We will continue to follow, next CT chest to be done in June 2025

## 2022-12-15 NOTE — Assessment & Plan Note (Signed)
Contributing to cough.  She has been using a nasal steroid on an as-needed basis, discussed possibly going to every day during the allergy season

## 2022-12-15 NOTE — Progress Notes (Signed)
Subjective:    Patient ID: [name], female    DOB: [birth year], 77 y.o.   MRN: [id number]  HPI  ROV 01/20/2022 --pleasant 77 year old woman with a history of former tobacco use (10 pack years), psoriasis (previously on Enbrel), PE, duodenal neuroendocrine tumor.  She is followed for chronic bronchitic symptoms and waxing and waning pulmonary nodular disease (biopsy negative 02/2018).  She has restrictive disease on pulmonary function testing.  Repeat CT scan of the chest 01/14/2022 reviewed by me, shows a decrease in size of left-sided pulmonary nodule to 1.6 x 0.9 cm.  Other nodules overall stable with the exception of a paramediastinal left upper lobe nodule, 9 mm, previously 7 mm.  ROV 12/15/2022 --follow-up visit for 77 year old woman with a history of former tobacco use (10 pack years), psoriasis (no longer on Enbrel), PE, duodenal neuroendocrine tumor.  I have followed her for waxing and waning pulmonary nodular disease (negative biopsy 02/2018) as well as restrictive lung disease on pulmonary function testing.  Most recent imaging includes a dotatate PET scan 08/03/2022 that showed that her pulmonary nodules were unchanged in the anterior medial left upper lobe up to 5 mm without any PET uptake. She has been dealing with cough, non-productive. Has seemed to improve some in the last week. She started a nasal steroid about a week ago.    Review of Systems As per HPI  Past Medical History:  Diagnosis Date   ALLERGIC RHINITIS    ANEMIA-NOS    Arthritis    ASTHMA    Breast discharge    Carpal tunnel syndrome    COLONIC POLYPS, HX OF    Coronary artery disease    "mild CAD" noted on 12/05/17 in coronary CT scan   DIABETES MELLITUS, TYPE II    diet controlled   GERD    Hand tingling    HYPERLIPIDEMIA    HYPERTENSION    Neuroendocrine tumor    OSTEOPENIA    PONV (postoperative nausea and vomiting)    " it relaxes my bladder muscles and I have been known to pee all over the  place"   Psoriasis    severe, began soriatane 01/2012   Pulmonary embolism (HCC)    Rectal fissure    Scoliosis    Wears glasses      Family History  Problem Relation Age of Onset   Lung cancer Father    Arthritis Other        Parents   Asthma Other        parent, other relative   Breast cancer Other        other relative   Hypertension Other        parent, other relative   Heart disease Other        parent, other relative   Heart disease Mother    Asthma Mother    Breast cancer Maternal Aunt 22   Parkinson's disease Maternal Grandmother    Rheumatic fever Maternal Grandfather      Social History   Socioeconomic History   Marital status: Single    Spouse name: Not on file   Number of children: 1   Years of education: 16   Highest education level: Bachelor's degree (e.g., BA, AB, BS)  Occupational History   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  Has one child and one grandchild.  Retired Development worker, international aid.  Education: college.    Social Determinants of Health   Financial Resource Strain: Low Risk  (10/07/2022)   Overall Financial Resource Strain (CARDIA)    Difficulty of Paying Living Expenses: Not hard at all  Food Insecurity: No Food Insecurity (10/07/2022)   Hunger Vital Sign    Worried About Running Out of Food in the Last Year: Never true    Ran Out of Food in the Last Year: Never true  Transportation Needs: No  Transportation Needs (10/07/2022)   PRAPARE - Administrator, Civil Service (Medical): No    Lack of Transportation (Non-Medical): No  Physical Activity: Inactive (10/07/2022)   Exercise Vital Sign    Days of Exercise per Week: 0 days    Minutes of Exercise per Session: 0 min  Stress: No Stress Concern Present (10/07/2022)   [employer] of Occupational Health - Occupational Stress Questionnaire    Feeling of Stress : Only a little  Social Connections: Socially Isolated (10/07/2022)   Social Connection and Isolation Panel [NHANES]    Frequency of Communication with Friends and Family: More than three times a week    Frequency of Social Gatherings with Friends and Family: Once a week    Attends Religious Services: Never    Database administrator or Organizations: No    Attends Banker Meetings: Never    Marital Status: Widowed  Intimate Partner Violence: Patient Unable To Answer (10/07/2022)   Humiliation, Afraid, Rape, and Kick questionnaire    Fear of Current or Ex-Partner: Patient unable to answer    Emotionally Abused: Patient unable to answer    Physically Abused: Patient unable to answer    Sexually Abused: Patient unable to answer     Allergies  Allergen Reactions   Phenergan [Promethazine Hcl] Shortness Of Breath and Anxiety   Benicar [Olmesartan] Other (See Comments)    Colitis-sprue enteropathy   Repatha [Evolocumab]     Muscle pain and weakness   Amlodipine Swelling   Clarithromycin Nausea And Vomiting    May have episodes of syncope due to prolonged vomiting    Codeine Nausea And Vomiting    May have episodes of syncope due to prolonged vomiting   Erythromycin Nausea And Vomiting   Hydralazine Hcl     Drug-induced lupus - pt wishes to avoid   Statins     Muscle pain    Tetracycline Nausea And Vomiting     Outpatient Medications Prior to Visit  Medication Sig Dispense Refill   Ascorbic Acid (VITAMIN C GUMMIE PO) Take 282 mg by mouth  every evening. Each tablet 141 mg     calcium carbonate (TUMS EX) 750 MG chewable tablet Chew 750 mg by mouth daily.     Cholecalciferol (VITAMIN D3 GUMMIES ADULT PO) Take 2,000 Units by mouth every evening.     clindamycin (CLEOCIN T) 1 % external solution Apply 1 application topically daily as needed (wounds).  clobetasol (TEMOVATE) 0.05 % external solution Apply topically daily.     Coenzyme Q10 (COQ10 GUMMIES ADULT PO) Take 200 mg by mouth in the morning and at bedtime.     CREON 36000-114000 units CPEP capsule Take 36,000 Units by mouth 3 (three) times daily before meals.     diphenhydrAMINE (BENADRYL) 25 MG tablet Take 25 mg by mouth at bedtime.     ELIQUIS 5 MG TABS tablet TAKE 1 TABLET BY MOUTH TWICE A DAY 180 tablet 2   FINACEA 15 % gel Apply topically 2 (two) times daily.     folic acid (FOLVITE) 1 MG tablet TAKE 1 TABLET BY MOUTH EVERY DAY 90 tablet 1   glucose blood (ONETOUCH ULTRA) test strip TEST DAILY AND AS NEEDED 300 strip 1   halobetasol (ULTRAVATE) 0.05 % cream APPLY TO AFFECTED AREA TWICE A DAY     lisinopril (ZESTRIL) 10 MG tablet TAKE 1 TABLET BY MOUTH EVERY DAY 90 tablet 1   magnesium oxide (MAG-OX) 400 MG tablet Take 1 tablet (400 mg total) by mouth daily. 90 tablet 1   Multiple Vitamins-Minerals (ADULT GUMMY PO) Take 1 tablet by mouth every evening. Children [employer] gummie formula     omeprazole (PRILOSEC OTC) 20 MG tablet Take 20 mg by mouth daily. Takes 10 mg in am and 10 mg in pm     ondansetron (ZOFRAN-ODT) 8 MG disintegrating tablet Take 1 tablet (8 mg total) by mouth every 8 (eight) hours as needed for nausea. 60 tablet 2   oxymetazoline (NASAL RELIEF) 0.05 % nasal spray Place into the nose.     polyethylene glycol (MIRALAX / GLYCOLAX) 17 g packet Take 17 g by mouth daily as needed.     rosuvastatin (CRESTOR) 5 MG tablet Take 1 tablet (5 mg total) by mouth 2 (two) times a week. Sometimes 3 times a week 45 tablet 3   Simethicone (GAS-X PO) Take 250 mg by  mouth daily as needed.     VASCEPA 1 g capsule Take 2 capsules (2 g total) by mouth 2 (two) times daily. 360 capsule 3   vitamin B-12 (CYANOCOBALAMIN) 1000 MCG tablet Take 1,000 mcg by mouth daily.     No facility-administered medications prior to visit.         Objective:   Physical Exam  Vitals:   12/15/22 1418  BP: 128/70  Pulse: 95  Temp: 98.1 F (36.7 C)  TempSrc: Oral  SpO2: 93%  Weight: 147 lb 3.2 oz (66.8 kg)  Height: 5\' 1"  (1.549 m)   Gen: Pleasant, overweight woman, in no distress,  normal affect  ENT: No lesions,  mouth clear,  oropharynx clear, no postnasal drip  Neck: No JVD, no stridor  Lungs: No use of accessory muscles, no crackles or wheezing on normal respiration, no wheeze on forced expiration  Cardiovascular: RRR, heart sounds normal, no murmur or gallops, no peripheral edema  Musculoskeletal: No deformities, no cyanosis or clubbing  Neuro: alert, awake, non focal  Skin: Warm, no lesions or rash     Assessment & Plan:   Multiple pulmonary nodules determined by computed tomography of lung Stable on PET scan 07/2022.  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  We will continue to follow, next CT chest to be done in June 2025  Allergic rhinitis Contributing to cough.  She has been using a nasal steroid on an as-needed basis, discussed possibly going to every day during the allergy season  Levy Pupa, MD, PhD 12/15/2022, 2:39 PM [facility] [phone] or if no answer before 7:00PM call [phone] For any issues after 7:00PM please call eLink [phone]

## 2022-12-15 NOTE — Patient Instructions (Signed)
We reviewed your PET scan today We will plan for a repeat CT scan of the chest without contrast in June 2025 to follow small pulmonary nodules Continue to use your steroid nasal spray for nasal congestion, coughing.  You might want to consider taking it daily through the fall allergy season Depending on how your cough does we may have to consider whether you can stay on your lisinopril Follow Dr. Delton Coombes in June 2025 after your CT chest or sooner if you have any problems

## 2022-12-17 ENCOUNTER — Other Ambulatory Visit: Payer: Self-pay | Admitting: Internal Medicine

## 2023-03-01 ENCOUNTER — Other Ambulatory Visit: Payer: Self-pay | Admitting: Internal Medicine

## 2023-03-16 ENCOUNTER — Other Ambulatory Visit: Payer: Self-pay | Admitting: Internal Medicine

## 2023-04-13 ENCOUNTER — Encounter: Payer: Self-pay | Admitting: Internal Medicine

## 2023-04-13 NOTE — Progress Notes (Unsigned)
 Subjective:    Patient ID: [name], female    DOB: 1945-03-26, 78 y.o.   MRN: [id number]     HPI [name] is here for follow up of her chronic medical problems.   Tired all the time.   Only sleeping 4 hrs / night.  Sometimes difficulty falling asleep.  Sometimes wakes early.  She is sedentary.  BP well controlled at home  2 days ago saw GI for dysphagia-she recommended a timed barium esophagram-ideal if I ordered it at home.  If that was normal then she would recommend a video swallow study with speech therapy.  Taking vitamin c, D, MVI, magnesium, B12 and Co-Q10  Medications and allergies reviewed with patient and updated if appropriate.  Current Outpatient Medications on File Prior to Visit  Medication Sig Dispense Refill   Ascorbic Acid (VITAMIN C GUMMIE PO) Take 282 mg by mouth every evening. Each tablet 141 mg     calcium carbonate (TUMS EX) 750 MG chewable tablet Chew 750 mg by mouth daily.     Cholecalciferol (VITAMIN D3 GUMMIES ADULT PO) Take 2,000 Units by mouth every evening.     clindamycin (CLEOCIN T) 1 % external solution Apply 1 application topically daily as needed (wounds).      clobetasol (TEMOVATE) 0.05 % external solution Apply topically daily.     Coenzyme Q10 (COQ10 GUMMIES ADULT PO) Take 200 mg by mouth in the morning and at bedtime.     colchicine 0.6 MG tablet TAKE 0.5 TABLET BY MOUTH DAILY AS NEEDED (GOUT OR PSUEDOGOUT PAIN).     CREON 36000-114000 units CPEP capsule Take 36,000 Units by mouth 3 (three) times daily before meals.     diphenhydrAMINE (BENADRYL) 25 MG tablet Take 25 mg by mouth at bedtime.     ELIQUIS 5 MG TABS tablet TAKE 1 TABLET BY MOUTH TWICE A DAY 180 tablet 2   FINACEA 15 % gel Apply topically 2 (two) times daily.     folic acid (FOLVITE) 1 MG tablet TAKE 1 TABLET BY MOUTH EVERY DAY 90 tablet 1   glucose blood (ONETOUCH ULTRA) test strip TEST DAILY AND AS NEEDED 300 strip 1   halobetasol (ULTRAVATE) 0.05 % cream  APPLY TO AFFECTED AREA TWICE A DAY     lisinopril (ZESTRIL) 10 MG tablet TAKE 1 TABLET BY MOUTH EVERY DAY 90 tablet 1   Multiple Vitamins-Minerals (ADULT GUMMY PO) Take 1 tablet by mouth every evening. Children [employer] gummie formula     omeprazole (PRILOSEC OTC) 20 MG tablet Take 20 mg by mouth daily. Takes 10 mg in am and 10 mg in pm     ondansetron (ZOFRAN-ODT) 8 MG disintegrating tablet TAKE 1 TABLET BY MOUTH EVERY 8 HOURS AS NEEDED FOR NAUSEA 60 tablet 2   oxymetazoline (NASAL RELIEF) 0.05 % nasal spray Place into the nose.     polyethylene glycol (MIRALAX / GLYCOLAX) 17 g packet Take 17 g by mouth daily as needed.     rosuvastatin (CRESTOR) 5 MG tablet Take 1 tablet (5 mg total) by mouth 2 (two) times a week. Sometimes 3 times a week 45 tablet 3   Simethicone (GAS-X PO) Take 250 mg by mouth daily as needed.     vitamin B-12 (CYANOCOBALAMIN) 1000 MCG tablet Take 1,000 mcg by mouth daily.     magnesium oxide (MAG-OX) 400 MG tablet Take 1 tablet (400 mg total) by mouth daily. (Patient not taking: Reported on 04/14/2023) 90 tablet 1  VASCEPA 1 g capsule Take 2 capsules (2 g total) by mouth 2 (two) times daily. 360 capsule 3   No current facility-administered medications on file prior to visit.     Review of Systems  Constitutional:  Positive for fatigue. Negative for fever.  HENT:  Positive for trouble swallowing (pills, occ saliva).   Eyes:  Positive for visual disturbance (occular migraines).  Respiratory:  Positive for cough (related to lisinopril most likely) and shortness of breath (with any exertion). Negative for wheezing.   Cardiovascular:  Negative for chest pain, palpitations and leg swelling.       Feels skipped beat at times when she feels her pulse  Gastrointestinal:  Positive for abdominal pain (sometimes), anal bleeding (releated to fissure) and constipation. Negative for blood in stool and diarrhea.       Occ gerd  Musculoskeletal:  Positive for back pain.   Neurological:  Positive for dizziness (occ) and light-headedness (occ). Negative for headaches.  Psychiatric/Behavioral:  Positive for sleep disturbance.        Objective:   Vitals:   04/14/23 0834  BP: 132/76  Pulse: 76  Temp: 98 F (36.7 C)  SpO2: 96%   BP Readings from Last 3 Encounters:  04/14/23 132/76  12/15/22 128/70  12/08/22 124/88   Wt Readings from Last 3 Encounters:  04/14/23 149 lb (67.6 kg)  12/15/22 147 lb 3.2 oz (66.8 kg)  12/08/22 147 lb 4.8 oz (66.8 kg)   Body mass index is 28.15 kg/m.    Physical Exam Constitutional:      General: She is not in acute distress.    Appearance: Normal appearance.  HENT:     Head: Normocephalic and atraumatic.  Eyes:     Conjunctiva/sclera: Conjunctivae normal.  Cardiovascular:     Rate and Rhythm: Normal rate and regular rhythm.     Heart sounds: Normal heart sounds.  Pulmonary:     Effort: Pulmonary effort is normal. No respiratory distress.     Breath sounds: Normal breath sounds. No wheezing.  Musculoskeletal:     Cervical back: Neck supple.     Right lower leg: No edema.     Left lower leg: No edema.  Lymphadenopathy:     Cervical: No cervical adenopathy.  Skin:    General: Skin is warm and dry.     Findings: No rash.  Neurological:     Mental Status: She is alert. Mental status is at baseline.  Psychiatric:        Mood and Affect: Mood normal.        Behavior: Behavior normal.        Lab Results  Component Value Date   WBC 7.5 10/25/2022   HGB 12.8 10/25/2022   HCT 39.7 10/25/2022   PLT 294 10/25/2022   GLUCOSE 129 (H) 10/25/2022   CHOL 217 (H) 10/11/2022   TRIG 183.0 (H) 10/11/2022   HDL 55.90 10/11/2022   LDLDIRECT 113.0 10/05/2021   LDLCALC 124 (H) 10/11/2022   ALT 24 10/25/2022   AST 23 10/25/2022   NA 140 10/25/2022   K 4.0 10/25/2022   CL 102 10/25/2022   CREATININE 1.10 (H) 10/25/2022   BUN 20 10/25/2022   CO2 29 10/25/2022   TSH 2.63 11/11/2020   INR 1.9 (H) 06/23/2020    HGBA1C 6.8 (H) 10/11/2022   MICROALBUR <0.7 10/11/2022     Assessment & Plan:    See Problem List for Assessment and Plan of chronic medical problems.

## 2023-04-13 NOTE — Patient Instructions (Addendum)
      Blood work was ordered.       Medications changes include :   None    An esophagram was ordered and someone will call you to schedule an appointment.     Return in about 6 months (around 10/12/2023) for Physical Exam.

## 2023-04-14 ENCOUNTER — Encounter: Payer: Self-pay | Admitting: Internal Medicine

## 2023-04-14 ENCOUNTER — Ambulatory Visit (INDEPENDENT_AMBULATORY_CARE_PROVIDER_SITE_OTHER): Payer: Medicare Other | Admitting: Internal Medicine

## 2023-04-14 VITALS — BP 132/76 | HR 76 | Temp 98.0°F | Ht 61.0 in | Wt 149.0 lb

## 2023-04-14 DIAGNOSIS — R5382 Chronic fatigue, unspecified: Secondary | ICD-10-CM | POA: Diagnosis not present

## 2023-04-14 DIAGNOSIS — E785 Hyperlipidemia, unspecified: Secondary | ICD-10-CM

## 2023-04-14 DIAGNOSIS — R1310 Dysphagia, unspecified: Secondary | ICD-10-CM

## 2023-04-14 DIAGNOSIS — K8681 Exocrine pancreatic insufficiency: Secondary | ICD-10-CM

## 2023-04-14 DIAGNOSIS — M109 Gout, unspecified: Secondary | ICD-10-CM

## 2023-04-14 DIAGNOSIS — N1831 Chronic kidney disease, stage 3a: Secondary | ICD-10-CM | POA: Diagnosis not present

## 2023-04-14 DIAGNOSIS — R600 Localized edema: Secondary | ICD-10-CM

## 2023-04-14 DIAGNOSIS — E118 Type 2 diabetes mellitus with unspecified complications: Secondary | ICD-10-CM

## 2023-04-14 DIAGNOSIS — I10 Essential (primary) hypertension: Secondary | ICD-10-CM

## 2023-04-14 DIAGNOSIS — E559 Vitamin D deficiency, unspecified: Secondary | ICD-10-CM

## 2023-04-14 LAB — LIPID PANEL
Cholesterol: 214 mg/dL — ABNORMAL HIGH (ref 0–200)
HDL: 59.3 mg/dL (ref >39.00)
LDL Cholesterol: 126 mg/dL — ABNORMAL HIGH (ref 0–99)
NonHDL: 155.1
Total CHOL/HDL Ratio: 4
Triglycerides: 145 mg/dL (ref 0.0–149.0)
VLDL: 29 mg/dL (ref 0.0–40.0)

## 2023-04-14 LAB — CBC WITH DIFFERENTIAL/PLATELET
Basophils Absolute: 0.1 10*3/uL (ref 0.0–0.1)
Basophils Relative: 1.2 % (ref 0.0–3.0)
Eosinophils Absolute: 0.4 10*3/uL (ref 0.0–0.7)
Eosinophils Relative: 5.3 % — ABNORMAL HIGH (ref 0.0–5.0)
HCT: 40.7 % (ref 36.0–46.0)
Hemoglobin: 12.9 g/dL (ref 12.0–15.0)
Lymphocytes Relative: 22.3 % (ref 12.0–46.0)
Lymphs Abs: 1.7 10*3/uL (ref 0.7–4.0)
MCHC: 31.8 g/dL (ref 30.0–36.0)
MCV: 89.8 fl (ref 78.0–100.0)
Monocytes Absolute: 0.5 10*3/uL (ref 0.1–1.0)
Monocytes Relative: 6.1 % (ref 3.0–12.0)
Neutro Abs: 5.1 10*3/uL (ref 1.4–7.7)
Neutrophils Relative %: 65.1 % (ref 43.0–77.0)
Platelets: 273 10*3/uL (ref 150.0–400.0)
RBC: 4.53 Mil/uL (ref 3.87–5.11)
RDW: 16.6 % — ABNORMAL HIGH (ref 11.5–15.5)
WBC: 7.8 10*3/uL (ref 4.0–10.5)

## 2023-04-14 LAB — COMPREHENSIVE METABOLIC PANEL
ALT: 35 U/L (ref 0–35)
AST: 34 U/L (ref 0–37)
Albumin: 4.2 g/dL (ref 3.5–5.2)
Alkaline Phosphatase: 64 U/L (ref 39–117)
BUN: 21 mg/dL (ref 6–23)
CO2: 30 meq/L (ref 19–32)
Calcium: 9.8 mg/dL (ref 8.4–10.5)
Chloride: 100 meq/L (ref 96–112)
Creatinine, Ser: 1.16 mg/dL (ref 0.40–1.20)
GFR: 44.85 mL/min — ABNORMAL LOW (ref >60.00)
Glucose, Bld: 119 mg/dL — ABNORMAL HIGH (ref 70–99)
Potassium: 4.9 meq/L (ref 3.5–5.1)
Sodium: 140 meq/L (ref 135–145)
Total Bilirubin: 0.7 mg/dL (ref 0.2–1.2)
Total Protein: 7.5 g/dL (ref 6.0–8.3)

## 2023-04-14 LAB — VITAMIN D 25 HYDROXY (VIT D DEFICIENCY, FRACTURES): VITD: 50.61 ng/mL (ref 30.00–100.00)

## 2023-04-14 LAB — T4, FREE: Free T4: 0.88 ng/dL (ref 0.60–1.60)

## 2023-04-14 LAB — HEMOGLOBIN A1C: Hgb A1c MFr Bld: 7.2 % — ABNORMAL HIGH (ref 4.6–6.5)

## 2023-04-14 LAB — URIC ACID: Uric Acid, Serum: 7.9 mg/dL — ABNORMAL HIGH (ref 2.4–7.0)

## 2023-04-14 LAB — TSH: TSH: 2.52 u[IU]/mL (ref 0.35–5.50)

## 2023-04-14 LAB — T3, FREE: T3, Free: 3.2 pg/mL (ref 2.3–4.2)

## 2023-04-14 NOTE — Assessment & Plan Note (Addendum)
 Chronic Likely multifactorial Will check tfts Stressed that she needs to set herself a bedtime and make sure she is in bed long enough to get good sleep Advised increasing exercise if possible during the day

## 2023-04-14 NOTE — Assessment & Plan Note (Signed)
 Chronic With hyperlipidemia  Lab Results  Component Value Date   HGBA1C 6.8 (H) 10/11/2022   Sugars controlled She has not been compliant with a diabetic diet Check A1c Continue lifestyle control Stressed regular exercise, diabetic diet

## 2023-04-14 NOTE — Assessment & Plan Note (Addendum)
 Chronic Has had two episodes of gout since she was here last Taking colchicine as needed which helps Will check uric acid level Would prefer to not start allopurinol

## 2023-04-14 NOTE — Assessment & Plan Note (Signed)
 Chronic Taking vitamin D daily Check vitamin D level

## 2023-04-14 NOTE — Assessment & Plan Note (Signed)
 Chronic Following with GI On Creon Has intermittent abdominal pain, nausea and constipation

## 2023-04-14 NOTE — Assessment & Plan Note (Addendum)
 Saw GI yesterday-she wanted her to have a timed barium esophagram If that was normal then she would recommend a video swallow study with speech therapy Ideally she would like me to order these tests out of convenience-will try to order this for her and send results to GI

## 2023-04-14 NOTE — Assessment & Plan Note (Signed)
Chronic Controlled Stressed regular walking, elevating legs when sitting

## 2023-04-14 NOTE — Assessment & Plan Note (Signed)
 Chronic No longer seeing nephrology Mild, stable CMP, CBC

## 2023-04-14 NOTE — Assessment & Plan Note (Signed)
Chronic Regular exercise and healthy diet encouraged Check lipid panel  Continue Crestor 5 mg twice weekly 

## 2023-04-14 NOTE — Assessment & Plan Note (Addendum)
 Chronic Blood pressure okay here -typically better controlled at home Continue to monitor at home No change in medication Continue lisinopril 10 mg daily CMP

## 2023-04-16 ENCOUNTER — Encounter: Payer: Self-pay | Admitting: Internal Medicine

## 2023-04-24 ENCOUNTER — Other Ambulatory Visit: Payer: Self-pay

## 2023-04-24 ENCOUNTER — Inpatient Hospital Stay: Payer: Medicare Other | Attending: Internal Medicine

## 2023-04-24 DIAGNOSIS — D3A010 Benign carcinoid tumor of the duodenum: Secondary | ICD-10-CM | POA: Insufficient documentation

## 2023-04-24 DIAGNOSIS — C7A019 Malignant carcinoid tumor of the small intestine, unspecified portion: Secondary | ICD-10-CM

## 2023-04-24 DIAGNOSIS — Z7901 Long term (current) use of anticoagulants: Secondary | ICD-10-CM | POA: Insufficient documentation

## 2023-04-24 DIAGNOSIS — D3A8 Other benign neuroendocrine tumors: Secondary | ICD-10-CM

## 2023-04-24 DIAGNOSIS — R110 Nausea: Secondary | ICD-10-CM | POA: Insufficient documentation

## 2023-04-24 DIAGNOSIS — R5383 Other fatigue: Secondary | ICD-10-CM | POA: Insufficient documentation

## 2023-04-24 DIAGNOSIS — Z86718 Personal history of other venous thrombosis and embolism: Secondary | ICD-10-CM | POA: Insufficient documentation

## 2023-04-24 LAB — CMP (CANCER CENTER ONLY)
ALT: 30 U/L (ref 0–44)
AST: 25 U/L (ref 15–41)
Albumin: 4.1 g/dL (ref 3.5–5.0)
Alkaline Phosphatase: 69 U/L (ref 38–126)
Anion gap: 8 (ref 5–15)
BUN: 19 mg/dL (ref 8–23)
CO2: 29 mmol/L (ref 22–32)
Calcium: 9.2 mg/dL (ref 8.9–10.3)
Chloride: 101 mmol/L (ref 98–111)
Creatinine: 1.2 mg/dL — ABNORMAL HIGH (ref 0.44–1.00)
GFR, Estimated: 46 mL/min — ABNORMAL LOW (ref >60)
Glucose, Bld: 121 mg/dL — ABNORMAL HIGH (ref 70–99)
Potassium: 4 mmol/L (ref 3.5–5.1)
Sodium: 138 mmol/L (ref 135–145)
Total Bilirubin: 0.4 mg/dL (ref 0.0–1.2)
Total Protein: 7.2 g/dL (ref 6.5–8.1)

## 2023-04-24 LAB — CBC WITH DIFFERENTIAL (CANCER CENTER ONLY)
Abs Immature Granulocytes: 0.02 10*3/uL (ref 0.00–0.07)
Basophils Absolute: 0.1 10*3/uL (ref 0.0–0.1)
Basophils Relative: 1 %
Eosinophils Absolute: 0.3 10*3/uL (ref 0.0–0.5)
Eosinophils Relative: 4 %
HCT: 39.8 % (ref 36.0–46.0)
Hemoglobin: 12.6 g/dL (ref 12.0–15.0)
Immature Granulocytes: 0 %
Lymphocytes Relative: 22 %
Lymphs Abs: 1.6 10*3/uL (ref 0.7–4.0)
MCH: 28.6 pg (ref 26.0–34.0)
MCHC: 31.7 g/dL (ref 30.0–36.0)
MCV: 90.2 fL (ref 80.0–100.0)
Monocytes Absolute: 0.6 10*3/uL (ref 0.1–1.0)
Monocytes Relative: 8 %
Neutro Abs: 4.6 10*3/uL (ref 1.7–7.7)
Neutrophils Relative %: 65 %
Platelet Count: 333 10*3/uL (ref 150–400)
RBC: 4.41 MIL/uL (ref 3.87–5.11)
RDW: 15.9 % — ABNORMAL HIGH (ref 11.5–15.5)
WBC Count: 7.1 10*3/uL (ref 4.0–10.5)
nRBC: 0 % (ref 0.0–0.2)

## 2023-04-26 DIAGNOSIS — D3A010 Benign carcinoid tumor of the duodenum: Secondary | ICD-10-CM | POA: Diagnosis not present

## 2023-04-26 LAB — CHROMOGRANIN A: Chromogranin A (ng/mL): 2336 ng/mL — ABNORMAL HIGH (ref 0.0–101.8)

## 2023-05-01 ENCOUNTER — Inpatient Hospital Stay (HOSPITAL_BASED_OUTPATIENT_CLINIC_OR_DEPARTMENT_OTHER): Payer: Medicare Other | Admitting: Internal Medicine

## 2023-05-01 VITALS — BP 139/84 | HR 85 | Temp 98.3°F | Resp 17 | Ht 61.0 in | Wt 150.9 lb

## 2023-05-01 DIAGNOSIS — D3A8 Other benign neuroendocrine tumors: Secondary | ICD-10-CM | POA: Diagnosis not present

## 2023-05-01 DIAGNOSIS — D3A010 Benign carcinoid tumor of the duodenum: Secondary | ICD-10-CM | POA: Diagnosis not present

## 2023-05-01 LAB — 5 HIAA, QUANTITATIVE, URINE, 24 HOUR
5-HIAA, Ur: 5.2 mg/L
5-HIAA,Quant.,24 Hr Urine: 5.2 mg/(24.h) (ref 0.0–14.9)
Total Volume: 1000

## 2023-05-01 NOTE — Progress Notes (Signed)
 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  PRIOR THERAPY: None  CURRENT THERAPY: Eliquis 5 mg p.o. twice daily  INTERVAL HISTORY: [name] 78 y.o. female returns to the clinic today for follow-up visit.Discussed the use of AI scribe software for clinical note transcription with the patient, who gave verbal consent to proceed.  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  She experiences significant fatigue, describing it as being so tired she can hardly put one foot in front of the other. This fatigue persists despite normal laboratory results, including no signs of anemia, normal thyroid function, and stable chromogranin levels. No diarrhea, hot flashes, significant weight loss, or respiratory symptoms are present, which could be associated with her neuroendocrine tumor.  She experiences daily nausea and takes medication for it. She is under the care of a gastroenterologist at [facility] for this issue.  Her medical history includes a pulmonary embolism in 2019. She was initially on Xarelto but is now taking Eliquis for anticoagulation.  She has lung nodules that have been stable for a long time, with plans to see her pulmonologist in May or June for further evaluation.  In terms of physical activity, she engages in walking at the grocery store five days a week. She has a bad back and needs to hold onto something while walking.         MEDICAL HISTORY: Past Medical History:  Diagnosis Date   ALLERGIC RHINITIS    ANEMIA-NOS    Arthritis     ASTHMA    Breast discharge    Carpal tunnel syndrome    COLONIC POLYPS, HX OF    Coronary artery disease    "mild CAD" noted on 12/05/17 in coronary CT scan   DIABETES MELLITUS, TYPE II    diet controlled   GERD    Hand tingling    HYPERLIPIDEMIA    HYPERTENSION    Neuroendocrine tumor    OSTEOPENIA    PONV (postoperative nausea and vomiting)    " it relaxes my bladder muscles and I have been known to pee all over the place"   Psoriasis    severe, began soriatane 01/2012   Pulmonary embolism (HCC)    Rectal fissure    Scoliosis    Wears glasses     ALLERGIES:  is allergic to phenergan [promethazine hcl], benicar [olmesartan], repatha [evolocumab], amlodipine, clarithromycin, codeine, erythromycin, hydralazine hcl, statins, and tetracycline.  MEDICATIONS:  Current Outpatient Medications  Medication Sig Dispense Refill   Ascorbic Acid (VITAMIN C GUMMIE PO) Take 282 mg by mouth every evening. Each tablet 141 mg     calcium carbonate (TUMS EX) 750 MG chewable tablet Chew 750 mg by mouth daily.     Cholecalciferol (VITAMIN D3 GUMMIES ADULT PO) Take 2,000 Units by mouth every evening.     clindamycin (CLEOCIN T) 1 % external solution Apply 1 application topically daily as needed (wounds).      clobetasol (TEMOVATE) 0.05 % external solution Apply topically daily.     Coenzyme Q10 (COQ10 GUMMIES ADULT PO) Take 200 mg by  mouth in the morning and at bedtime.     colchicine 0.6 MG tablet TAKE 0.5 TABLET BY MOUTH DAILY AS NEEDED (GOUT OR PSUEDOGOUT PAIN).     CREON 36000-114000 units CPEP capsule Take 36,000 Units by mouth 3 (three) times daily before meals.     diphenhydrAMINE (BENADRYL) 25 MG tablet Take 25 mg by mouth at bedtime.     ELIQUIS 5 MG TABS tablet TAKE 1 TABLET BY MOUTH TWICE A DAY 180 tablet 2   FINACEA 15 % gel Apply topically 2 (two) times daily.     folic acid (FOLVITE) 1 MG tablet TAKE 1 TABLET BY MOUTH EVERY DAY 90 tablet 1   glucose blood (ONETOUCH ULTRA) test strip  TEST DAILY AND AS NEEDED 300 strip 1   halobetasol (ULTRAVATE) 0.05 % cream APPLY TO AFFECTED AREA TWICE A DAY     lisinopril (ZESTRIL) 10 MG tablet TAKE 1 TABLET BY MOUTH EVERY DAY 90 tablet 1   Multiple Vitamins-Minerals (ADULT GUMMY PO) Take 1 tablet by mouth every evening. Children [employer] gummie formula     omeprazole (PRILOSEC OTC) 20 MG tablet Take 20 mg by mouth daily. Takes 10 mg in am and 10 mg in pm     ondansetron (ZOFRAN-ODT) 8 MG disintegrating tablet TAKE 1 TABLET BY MOUTH EVERY 8 HOURS AS NEEDED FOR NAUSEA 60 tablet 2   oxymetazoline (NASAL RELIEF) 0.05 % nasal spray Place into the nose.     polyethylene glycol (MIRALAX / GLYCOLAX) 17 g packet Take 17 g by mouth daily as needed.     rosuvastatin (CRESTOR) 5 MG tablet Take 1 tablet (5 mg total) by mouth 2 (two) times a week. Sometimes 3 times a week 45 tablet 3   Simethicone (GAS-X PO) Take 250 mg by mouth daily as needed.     VASCEPA 1 g capsule Take 2 capsules (2 g total) by mouth 2 (two) times daily. 360 capsule 3   vitamin B-12 (CYANOCOBALAMIN) 1000 MCG tablet Take 1,000 mcg by mouth daily.     No current facility-administered medications for this visit.    SURGICAL HISTORY:  Past Surgical History:  Procedure Laterality Date   benign rectal growth  2004   removed by Dr. Abbey Chatters   BIOPSY  08/09/2018   Procedure: BIOPSY;  Surgeon: [name], MD;  Location: [facility] ENDOSCOPY;  Service: Endoscopy;;   BIOPSY  08/22/2018   Procedure: BIOPSY;  Surgeon: Charna Elizabeth, MD;  Location: [facility] ENDOSCOPY;  Service: Endoscopy;;   BIOPSY  03/07/2019   Procedure: BIOPSY;  Surgeon: Lemar Lofty., MD;  Location: [facility] ENDOSCOPY;  Service: Gastroenterology;;   BIOPSY  08/06/2020   Procedure: BIOPSY;  Surgeon: Lemar Lofty., MD;  Location: [facility] ENDOSCOPY;  Service: Gastroenterology;;   BREAST BIOPSY Right    BREAST EXCISIONAL BIOPSY Left    BREAST SURGERY  1988   biopsy   CESAREAN SECTION     COLONOSCOPY N/A 08/22/2018    Procedure: COLONOSCOPY;  Surgeon: Charna Elizabeth, MD;  Location: [facility] ENDOSCOPY;  Service: Endoscopy;  Laterality: N/A;   ENDOSCOPIC MUCOSAL RESECTION N/A 03/07/2019   Procedure: ENDOSCOPIC MUCOSAL RESECTION;  Surgeon: Meridee Score Netty Starring., MD;  Location: [facility] ENDOSCOPY;  Service: Gastroenterology;  Laterality: N/A;   ESOPHAGOGASTRODUODENOSCOPY (EGD) WITH PROPOFOL N/A 08/04/2018   Procedure: ESOPHAGOGASTRODUODENOSCOPY (EGD) WITH PROPOFOL;  Surgeon: Meryl Dare, MD;  Location: [facility] ENDOSCOPY;  Service: Gastroenterology;  Laterality: N/A;   ESOPHAGOGASTRODUODENOSCOPY (EGD) WITH PROPOFOL N/A 03/07/2019   Procedure: ESOPHAGOGASTRODUODENOSCOPY (EGD) WITH PROPOFOL;  Surgeon: Lemar Lofty., MD;  Location: [facility] ENDOSCOPY;  Service: Gastroenterology;  Laterality: N/A;   ESOPHAGOGASTRODUODENOSCOPY (EGD) WITH PROPOFOL N/A 08/06/2020   Procedure: ESOPHAGOGASTRODUODENOSCOPY (EGD) WITH PROPOFOL;  Surgeon: Meridee Score Netty Starring., MD;  Location: [facility] ENDOSCOPY;  Service: Gastroenterology;  Laterality: N/A;   EUS N/A 03/07/2019   Procedure: UPPER ENDOSCOPIC ULTRASOUND (EUS) RADIAL;  Surgeon: Lemar Lofty., MD;  Location: [facility] ENDOSCOPY;  Service: Gastroenterology;  Laterality: N/A;   EUS N/A 08/06/2020   Procedure: UPPER ENDOSCOPIC ULTRASOUND (EUS) RADIAL;  Surgeon: Lemar Lofty., MD;  Location: [facility] ENDOSCOPY;  Service: Gastroenterology;  Laterality: N/A;   FINE NEEDLE ASPIRATION  03/07/2019   Procedure: FINE NEEDLE ASPIRATION (FNA) LINEAR;  Surgeon: Lemar Lofty., MD;  Location: [facility] ENDOSCOPY;  Service: Gastroenterology;;   FINE NEEDLE ASPIRATION  08/06/2020   Procedure: FINE NEEDLE ASPIRATION;  Surgeon: Lemar Lofty., MD;  Location: [facility] ENDOSCOPY;  Service: Gastroenterology;;   Wenda Low SIGMOIDOSCOPY N/A 08/04/2018   Procedure: Arnell Sieving;  Surgeon: Meryl Dare, MD;  Location: [facility] ENDOSCOPY;  Service: Gastroenterology;  Laterality: N/A;   FLEXIBLE SIGMOIDOSCOPY  N/A 08/09/2018   Procedure: FLEXIBLE SIGMOIDOSCOPY;  Surgeon: [name], MD;  Location: [facility] ENDOSCOPY;  Service: Endoscopy;  Laterality: N/A;   HEMOSTASIS CLIP PLACEMENT  03/07/2019   Procedure: HEMOSTASIS CLIP PLACEMENT;  Surgeon: Meridee Score Netty Starring., MD;  Location: [facility] ENDOSCOPY;  Service: Gastroenterology;;   SUBMUCOSAL LIFTING INJECTION  03/07/2019   Procedure: SUBMUCOSAL LIFTING INJECTION;  Surgeon: Lemar Lofty., MD;  Location: [facility] ENDOSCOPY;  Service: Gastroenterology;;   VIDEO BRONCHOSCOPY WITH ENDOBRONCHIAL NAVIGATION N/A 03/14/2018   Procedure: VIDEO BRONCHOSCOPY WITH ENDOBRONCHIAL NAVIGATION;  Surgeon: Leslye Peer, MD;  Location: [facility] OR;  Service: Thoracic;  Laterality: N/A;    REVIEW OF SYSTEMS:  A comprehensive review of systems was negative except for: Constitutional: positive for fatigue   PHYSICAL EXAMINATION: General appearance: alert, cooperative, fatigued, and no distress Head: Normocephalic, without obvious abnormality, atraumatic Neck: no adenopathy, no JVD, supple, symmetrical, trachea midline, and thyroid not enlarged, symmetric, no tenderness/mass/nodules Lymph nodes: Cervical, supraclavicular, and axillary nodes normal. Resp: clear to auscultation bilaterally Back: symmetric, no curvature. ROM normal. No CVA tenderness. Cardio: regular rate and rhythm, S1, S2 normal, no murmur, click, rub or gallop GI: soft, non-tender; bowel sounds normal; no masses,  no organomegaly Extremities: extremities normal, atraumatic, no cyanosis or edema  ECOG PERFORMANCE STATUS: 1 - Symptomatic but completely ambulatory  Blood pressure 139/84, pulse 85, temperature 98.3 F (36.8 C), temperature source Temporal, resp. rate 17, height 5\' 1"  (1.549 m), weight 150 lb 14.4 oz (68.4 kg), SpO2 100%.  LABORATORY DATA: Lab Results  Component Value Date   WBC 7.1 04/24/2023   HGB 12.6 04/24/2023   HCT 39.8 04/24/2023   MCV 90.2 04/24/2023   PLT 333 04/24/2023       Chemistry      Component Value Date/Time   NA 138 04/24/2023 0845   NA 141 09/03/2018 0824   K 4.0 04/24/2023 0845   CL 101 04/24/2023 0845   CO2 29 04/24/2023 0845   BUN 19 04/24/2023 0845   BUN 8 09/03/2018 0824   CREATININE 1.20 (H) 04/24/2023 0845   CREATININE 1.08 (H) 11/05/2019 0844      Component Value Date/Time   CALCIUM 9.2 04/24/2023 0845   ALKPHOS 69 04/24/2023 0845   AST 25 04/24/2023 0845   ALT 30 04/24/2023 0845   BILITOT 0.4 04/24/2023 0845       RADIOGRAPHIC STUDIES: No results  found.   ASSESSMENT AND PLAN: This is a very pleasant 78 years old white female with suspicious carcinoid tumor of the duodenum as well as a small pulmonary nodules.  The patient has no clear pathology confirming her diagnosis but highly suspicious on the imaging studies. She had repeat PET dotatate scan on December 14, 2020 that showed focal radiotracer activity localized in the second portion of the duodenum corresponding to enhancing lesion on the comparison CT and consistent with a well-differentiated small neuroendocrine tumor.  There was no evidence of metastatic adenopathy in the abdomen and pelvis and no liver or mesenteric metastasis.  The patient had new bilateral pulmonary nodularity with discrete nodule in the right lung and peribronchial thickening and small nodularity in the right middle lobe and lingula but no activity associated with these lesions. She is followed by Dr. Delton Coombes from pulmonary medicine. She was seen recently at [facility] by Dr. Reginia Naas as well as a GI surgeon Dr. Derek Mound and both of them recommended for her to continue on observation.     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 Chromogranin levels have been fluctuating, but 5-HIAA levels in urine are normal, indicating stability. Fatigue and nausea are  present but not directly attributed to the tumor. Octreotide injections were discussed as a potential treatment to alleviate symptoms like diarrhea and hot flashes, but she is not experiencing these symptoms, and the injections would not alter the disease course. - Monitor symptoms and lab results every six months - Repeat blood work in six months  Pulmonary embolism She is on Eliquis for anticoagulation since a pulmonary embolism in 2019. There is no indication of current embolic events, and she is under the care of a pulmonologist for lung nodules, which are well-managed. - Continue Eliquis as prescribed - Follow up with pulmonologist for lung nodules in May or June  Fatigue Significant fatigue is reported, not explained by anemia, thyroid dysfunction, or vitamin B12 deficiency. Exercise is suggested to help manage fatigue, and she is considering physical therapy once flu rates decrease. - Encourage regular exercise - Consider physical therapy when flu rates decrease  Nausea She experiences daily nausea and takes medication for it. The cause of the nausea is not identified and is not directly linked to the carcinoid tumor. - Continue current medication for nausea   The patient was advised to call immediately if she has any concerning symptoms in the interval.  The patient voices understanding of current disease status and treatment options and is in agreement with the current care plan.  All questions were answered. The patient knows to call the clinic with any problems, questions or concerns. We can certainly see the patient much sooner if necessary. The total time spent in the appointment was 20 minutes.   Disclaimer: This note was dictated with voice recognition software. Similar sounding words can inadvertently be transcribed and may not be corrected upon review.

## 2023-05-26 ENCOUNTER — Other Ambulatory Visit: Payer: Self-pay

## 2023-05-26 MED ORDER — ROSUVASTATIN CALCIUM 5 MG PO TABS: 5.0000 mg | ORAL_TABLET | ORAL | 1 refills | Status: AC

## 2023-06-14 NOTE — Progress Notes (Signed)
 Cardiology Office Note:  .   Date:  06/15/2023  ID:  [name], DOB 1945/04/14, MRN [id number] PCP: Colene Dauphin, MD  [facility] Providers Cardiologist:  Sheryle Donning, MD    Patient Profile: .      PMH Nonobstructive CAD on coronary CTA 2019 Aortic atherosclerosis PE on chronic anticoagulation Hypertension Hyperlipidemia Statin intolerance Pulmonary nodules  Neuroendocrine tumor in duodenum - conservative management  She initially established with cardiology and was seen by Dr. Nicholette Barley in 2019 for worsening shortness of breath on exertion.  She was also having difficulty controlling her blood pressure with SBP as high as 200 mmHg.  She underwent coronary CTA 11/2017 which revealed CAC score of 6 (85th percentile), mild CAD in the proximal LAD.  She reported intolerance to pravastatin , atorvastatin, and [place] statin.  She reported Repatha  caused "paralysis."  At follow-up visit 10/29/2020 with Neomi Banks, NP, she reported tolerating rosuvastatin  2 days a week but had myalgias when she takes it 3 days a week.  She had self reduced her dose of Vascepa  due to concern for bleeding.  She reported previous sleep study showed mild sleep apnea but no indication for CPAP.  She says she would not wear CPAP if sleep apnea were diagnosed.  She established with Dr. Ardell Beauvais and underwent echo 05/12/2021 which revealed normal heart function and no significant valve disease.  Lexiscan  Myoview  05/19/2021 revealed normal heart function, low risk study.  She had reported "feeling like she is holding her breath but she is not."  This was usually triggered with light activity.  Last cardiology visit was 12/08/2022 with Dr. Veryl Gottron, transitioned following Dr. Pemberton's departure.  She was able to walk 1/2 mile before feeling short winded.  She also has chronic back pain and felt that her balance was worse.  She reported mild cough on lisinopril  but felt it was manageable.  No  concerning cardiac symptoms.  She was encouraged to gradually increase activity and return in 6 months for follow-up.       History of Present Illness: .    History of Present Illness [name] "[name]" is a pleasant 78 year old female who is here today for follow-up of shortness of breath. She reports worsening shortness of breath with minimal exertion for the past 3-4 months. She reports she was exhausted after walking 700 on the day prior.  She reports that she has to sit and take deep breaths to recover.  She uses a rolling walker due to severe back problems.  In 2019, she was able to walk 2 to 3 miles a day, but has not been as active since that time. History of lung nodules which are being monitored by pulmonology. 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  She denies chest or left arm discomfort, orthopnea, PND, edema, presyncope, syncope, or palpitations. She experiences balance issues and disorientation, which she associates with small vessel disease. Her thinking is less clear, and she sometimes feels disoriented while driving, especially at night.  She has stopped driving at night.  Reports multiple medication intolerances but feels she is tolerating current therapy without any concerning side effects. Reports weight gain of 15 lbs since 2020 which she attributes to a pancreatic supplement and gastrointestinal issues. Home BP 100-126/73-90 mmHg.    Discussed the use of AI scribe software for clinical note transcription with the patient, who gave verbal  consent to proceed.   ROS: See HPI       Studies Reviewed: Aaron Aas        No results found for: "LIPOA"   Risk Assessment/Calculations:     HYPERTENSION CONTROL Vitals:   06/15/23 0948 06/15/23 1023  BP: (!) 148/90 (!) 142/94    The patient's blood pressure is elevated above target today.  In order to address the patient's elevated  BP: Blood pressure will be monitored at home to determine if medication changes need to be made.; The blood pressure is usually elevated in clinic.  Blood pressures monitored at home have been optimal.          Physical Exam:   VS:  BP (!) 142/94   Pulse 70   Ht 5\' 1"  (1.549 m)   Wt 150 lb 12.8 oz (68.4 kg)   SpO2 96%   BMI 28.49 kg/m    Wt Readings from Last 3 Encounters:  06/15/23 150 lb 12.8 oz (68.4 kg)  05/01/23 150 lb 14.4 oz (68.4 kg)  04/14/23 149 lb (67.6 kg)    GEN: Well nourished, well developed in no acute distress NECK: No JVD; No carotid bruits CARDIAC: RRR, no murmurs, rubs, gallops RESPIRATORY:  Clear to auscultation without rales, wheezing or rhonchi  ABDOMEN: Soft, non-tender, non-distended EXTREMITIES:  No edema; No deformity     ASSESSMENT AND PLAN: .    Assessment & Plan Hypertension   Blood pressure is elevated in the office, consistent with white coat hypertension, but home readings are better controlled. BP remained elevated on my recheck.  Continue current antihypertensive therapy of lisinopril .  Hyperlipidemia LDL Goal < 70 Lipid panel completed 04/14/2023 with total cholesterol 214, HDL 59, LDL 126, and triglycerides 145.  Emphasized the importance of LDL goal < 70. She takes Crestor  2-3 times weekly and has not been able to tolerate higher dose. Nexletol was discussed as an option, but she is not ready to agree. Provide information on Nexletol for her review. She lowered her dose of Vascepa  2/2 gout exacerbation. Continue Vascepa .   Shortness of Breath/History of PE  She reports worsening dyspnea on exertion over the past 3-4 months. This is likely multifactorial in the setting of deconditioning from decreased activity, history of chronic cough thought to be secondary to allergic rhinitis, history of PE on chronic anticoagulation, and multiple pulmonary nodules.  Echo 04/2021 revealed normal LVEF, normal diastolic function and no significant valve disease.  No orthopnea, PND, edema, or weight gain. No chest pain or symptoms concerning for angina. No increased work of breathing. No acute concerns for PE today. Nonobstructive CAD on CCTA 11/2017. Encouraged her to continue to work on increasing physical activity and follow-up with pulmonology. Consider physical therapy to improve strength and endurance.  CAD Coronary CTA 11/2017 with CAC score of 6 (85th percentile) these, with mild CAD in proximal LAD. She has shortness of breath that is atypical for angina. No chest pain.  No indication for further ischemia evaluation at this time.  Severe back problems   Severe back issues require a rollator, contributing to decreased activity and potential deconditioning. Encouraged her to reach out to PCP who previously offered referral to physical therapy to address back pain and improve mobility.  Cognitive Decline Reports of fuzzy thinking and disorientation, especially while driving, but no formal cognitive decline is noted in assessments. Consider family assessment of driving comfort during the day. Consider further cognitive testing. 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 No acute  concerns today. Management per PCP.  Gout   Possibly exacerbated by Vascepa . Colchicine  was previously used but affected kidney function. Advised she may continue with reduced Vascepa  dosage.           Disposition:6 months with Dr. Veryl Gottron  Signed, Slater Duncan, NP-C

## 2023-06-15 ENCOUNTER — Ambulatory Visit (HOSPITAL_BASED_OUTPATIENT_CLINIC_OR_DEPARTMENT_OTHER): Admitting: Nurse Practitioner

## 2023-06-15 ENCOUNTER — Encounter (HOSPITAL_BASED_OUTPATIENT_CLINIC_OR_DEPARTMENT_OTHER): Payer: Self-pay | Admitting: Nurse Practitioner

## 2023-06-15 VITALS — BP 142/94 | HR 70 | Ht 61.0 in | Wt 150.8 lb

## 2023-06-15 DIAGNOSIS — R4189 Other symptoms and signs involving cognitive functions and awareness: Secondary | ICD-10-CM | POA: Diagnosis not present

## 2023-06-15 DIAGNOSIS — I10 Essential (primary) hypertension: Secondary | ICD-10-CM

## 2023-06-15 DIAGNOSIS — E785 Hyperlipidemia, unspecified: Secondary | ICD-10-CM | POA: Diagnosis not present

## 2023-06-15 DIAGNOSIS — I2510 Atherosclerotic heart disease of native coronary artery without angina pectoris: Secondary | ICD-10-CM | POA: Diagnosis not present

## 2023-06-15 DIAGNOSIS — Z86711 Personal history of pulmonary embolism: Secondary | ICD-10-CM

## 2023-06-15 DIAGNOSIS — R0602 Shortness of breath: Secondary | ICD-10-CM

## 2023-06-15 DIAGNOSIS — G8929 Other chronic pain: Secondary | ICD-10-CM

## 2023-06-15 DIAGNOSIS — M549 Dorsalgia, unspecified: Secondary | ICD-10-CM

## 2023-06-15 DIAGNOSIS — M109 Gout, unspecified: Secondary | ICD-10-CM

## 2023-06-15 MED ORDER — VASCEPA 1 G PO CAPS: 1.0000 g | ORAL_CAPSULE | Freq: Two times a day (BID) | ORAL | 2 refills | Status: AC

## 2023-06-15 NOTE — Patient Instructions (Signed)
 Medication Instructions:   CHANGE Vascepa  one (1) tablet by mouth ( 1 g) twice daily.   *If you need a refill on your cardiac medications before your next appointment, please call your pharmacy*  Lab Work:  None ordered.  If you have labs (blood work) drawn today and your tests are completely normal, you will receive your results only by: MyChart Message (if you have MyChart) OR A paper copy in the mail If you have any lab test that is abnormal or we need to change your treatment, we will call you to review the results.  Testing/Procedures:  None ordered.  Follow-Up: At [facility], you and your health needs are our priority.  As part of our continuing mission to provide you with exceptional heart care, our providers are all part of one team.  This team includes your primary Cardiologist (physician) and Advanced Practice Providers or APPs (Physician Assistants and Nurse Practitioners) who all work together to provide you with the care you need, when you need it.  Your next appointment:   5 month(s)  Provider:   Sheryle Donning, MD    We recommend signing up for the patient portal called "MyChart".  Sign up information is provided on this After Visit Summary.  MyChart is used to connect with patients for Virtual Visits (Telemedicine).  Patients are able to view lab/test results, encounter notes, upcoming appointments, etc.  Non-urgent messages can be sent to your provider as well.   To learn more about what you can do with MyChart, go to [email or web address].   Other Instructions  Bempedoic Acid Tablets What is this medication? BEMPEDOIC ACID (BEM pe DOE ik AS id) treats high cholesterol and reduces the risk of heart attack. It works by reducing the amount of cholesterol made by your body. Changes to diet and exercise are often combined with this medication. This medicine may be used for other purposes; ask your health care provider or pharmacist if you have  questions. COMMON BRAND NAME(S): NEXLETOL What should I tell my care team before I take this medication? They need to know if you have any of these conditions: Gout Kidney problems Liver disease Tendon problems An unusual or allergic reaction to bempedoic acid, other medications, foods, dyes, or preservatives Pregnant or trying to become pregnant Breast-feeding How should I use this medication? Take this medication by mouth. Take it as directed on the prescription label at the same time every day. You can take it with or without food. If it upsets your stomach, take it with food. Keep taking it unless your care team tells you to stop. Talk to your care team about the use of this medication in children. Special care may be needed. Overdosage: If you think you have taken too much of this medicine contact a poison control center or emergency room at once. NOTE: This medicine is only for you. Do not share this medicine with others. What if I miss a dose? If you miss a dose, take it as soon as you can. If it is almost time for your next dose, take only that dose. Do not take double or extra doses. What may interact with this medication? Pravastatin  Simvastatin This list may not describe all possible interactions. Give your health care provider a list of all the medicines, herbs, non-prescription drugs, or dietary supplements you use. Also tell them if you smoke, drink alcohol, or use illegal drugs. Some items may interact with your medicine. What should I watch for while using  this medication? Visit your care team for regular checks on your progress. Tell your care team if your symptoms do not start to get better or if they get worse. Your care team may tell you to stop taking this medication if you develop muscle problems. If your muscle problems do not go away after stopping this medication, contact your care team. Taking this medication is only part of a total heart healthy program. Ask your  care team if there are other changes you can make to improve your overall health. What side effects may I notice from receiving this medication? Side effects that you should report to your care team as soon as possible: Allergic reactions--skin rash, itching, hives, swelling of the face, lips, tongue, or throat High uric acid level--severe pain, redness, warmth, or swelling in joints, pain or trouble passing urine, pain in the lower back or sides Joint, muscle, or tendon pain, swelling, or stiffness Side effects that usually do not require medical attention (report to your care team if they continue or are bothersome): Diarrhea Muscle spasms Stomach pain This list may not describe all possible side effects. Call your doctor for medical advice about side effects. You may report side effects to FDA at 1-800-FDA-1088. Where should I keep my medication? Keep out of the reach of children and pets. Store between 15 and 30 degrees C (59 and 86 degrees F). Keep this medication in the original container. Protect from moisture. Keep the container tightly closed. Do not throw out the packet in the container. It keeps the medication dry. Get rid of any unused medication after the expiration date. To get rid of medications that are no longer needed or have expired: Take the medication to a medication take-back program. Check with your pharmacy or law enforcement to find a location. If you cannot return the medication, check the label or package insert to see if the medication should be thrown out in the trash or flushed down the toilet. If you are not sure, ask your care team. If it is safe to put it in the trash, empty the medication out of the container. Mix the medication with cat litter, dirt, coffee grounds, or other unwanted substance. Seal the mixture in a bag or container. Put it in the trash. NOTE: This sheet is a summary. It may not cover all possible information. If you have questions about this  medicine, talk to your doctor, pharmacist, or health care provider.  2024 Elsevier/Gold Standard (2023-01-13 00:00:00)

## 2023-08-01 ENCOUNTER — Ambulatory Visit (HOSPITAL_BASED_OUTPATIENT_CLINIC_OR_DEPARTMENT_OTHER): Admitting: Family

## 2023-08-04 ENCOUNTER — Other Ambulatory Visit: Payer: Self-pay | Admitting: Internal Medicine

## 2023-10-03 ENCOUNTER — Other Ambulatory Visit: Payer: Self-pay | Admitting: Internal Medicine

## 2023-10-13 ENCOUNTER — Encounter: Payer: Medicare Other | Admitting: Internal Medicine

## 2023-10-25 ENCOUNTER — Inpatient Hospital Stay: Attending: Internal Medicine

## 2023-10-25 DIAGNOSIS — D649 Anemia, unspecified: Secondary | ICD-10-CM | POA: Insufficient documentation

## 2023-10-25 DIAGNOSIS — D3A8 Other benign neuroendocrine tumors: Secondary | ICD-10-CM | POA: Insufficient documentation

## 2023-10-25 LAB — CBC WITH DIFFERENTIAL (CANCER CENTER ONLY)
Abs Immature Granulocytes: 0.03 K/uL (ref 0.00–0.07)
Basophils Absolute: 0.1 K/uL (ref 0.0–0.1)
Basophils Relative: 1 %
Eosinophils Absolute: 0.3 K/uL (ref 0.0–0.5)
Eosinophils Relative: 4 %
HCT: 36.6 % (ref 36.0–46.0)
Hemoglobin: 11.8 g/dL — ABNORMAL LOW (ref 12.0–15.0)
Immature Granulocytes: 0 %
Lymphocytes Relative: 23 %
Lymphs Abs: 1.6 K/uL (ref 0.7–4.0)
MCH: 28.3 pg (ref 26.0–34.0)
MCHC: 32.2 g/dL (ref 30.0–36.0)
MCV: 87.8 fL (ref 80.0–100.0)
Monocytes Absolute: 0.5 K/uL (ref 0.1–1.0)
Monocytes Relative: 8 %
Neutro Abs: 4.4 K/uL (ref 1.7–7.7)
Neutrophils Relative %: 64 %
Platelet Count: 315 K/uL (ref 150–400)
RBC: 4.17 MIL/uL (ref 3.87–5.11)
RDW: 17.3 % — ABNORMAL HIGH (ref 11.5–15.5)
WBC Count: 7 K/uL (ref 4.0–10.5)
nRBC: 0 % (ref 0.0–0.2)

## 2023-10-25 LAB — CMP (CANCER CENTER ONLY)
ALT: 31 U/L (ref 0–44)
AST: 29 U/L (ref 15–41)
Albumin: 4 g/dL (ref 3.5–5.0)
Alkaline Phosphatase: 75 U/L (ref 38–126)
Anion gap: 7 (ref 5–15)
BUN: 22 mg/dL (ref 8–23)
CO2: 30 mmol/L (ref 22–32)
Calcium: 9.5 mg/dL (ref 8.9–10.3)
Chloride: 102 mmol/L (ref 98–111)
Creatinine: 1.11 mg/dL — ABNORMAL HIGH (ref 0.44–1.00)
GFR, Estimated: 50 mL/min — ABNORMAL LOW (ref >60)
Glucose, Bld: 114 mg/dL — ABNORMAL HIGH (ref 70–99)
Potassium: 4.8 mmol/L (ref 3.5–5.1)
Sodium: 139 mmol/L (ref 135–145)
Total Bilirubin: 0.4 mg/dL (ref 0.0–1.2)
Total Protein: 7.3 g/dL (ref 6.5–8.1)

## 2023-10-26 LAB — CHROMOGRANIN A: Chromogranin A (ng/mL): 2397 ng/mL — ABNORMAL HIGH (ref 0.0–101.8)

## 2023-11-01 ENCOUNTER — Inpatient Hospital Stay (HOSPITAL_BASED_OUTPATIENT_CLINIC_OR_DEPARTMENT_OTHER): Admitting: Internal Medicine

## 2023-11-01 VITALS — BP 139/74 | HR 92 | Temp 97.4°F | Resp 17 | Ht 61.0 in | Wt 146.9 lb

## 2023-11-01 DIAGNOSIS — D3A8 Other benign neuroendocrine tumors: Secondary | ICD-10-CM

## 2023-11-01 NOTE — Progress Notes (Signed)
 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  PRIOR THERAPY: None  CURRENT THERAPY: Eliquis  5 mg p.o. twice daily  INTERVAL HISTORY: [name] 78 y.o. female returns to the clinic today for follow-up visit. Discussed the use of AI scribe software for clinical note transcription with the patient, who gave verbal consent to proceed.  History of Present Illness [name] is an 78 year old female who presents for evaluation with repeat blood work.  She has experienced increased nausea over the past six months, progressing from occasional to daily occurrences. Ondansetron  is taken daily, sometimes twice a day, to manage the nausea. Additionally, she experiences episodes of vomiting approximately once a week to once every week and a half.  She has had to quit eating meat due to it causing gout and stomach pain, which she attributes to a tick bite from a few years ago that has led to a decreased tolerance for meat. Occasionally, she consumes chicken.  She has a history of a pulmonary embolism diagnosed in November 2019, for which she was treated with Eliquis . She is scheduled for a barium swallow test due to recent episodes of choking.  No chest pain, breathing issues, night sweats, or unintentional weight loss. She wants to lose weight.    MEDICAL HISTORY: Past Medical History:  Diagnosis Date   ALLERGIC RHINITIS    ANEMIA-NOS    Arthritis    ASTHMA    Breast discharge    Carpal tunnel syndrome    COLONIC POLYPS, HX OF    Coronary artery disease    mild CAD noted on 12/05/17 in coronary CT scan   DIABETES MELLITUS, TYPE II    diet controlled   GERD     Hand tingling    HYPERLIPIDEMIA    HYPERTENSION    Neuroendocrine tumor    OSTEOPENIA    PONV (postoperative nausea and vomiting)     it relaxes my bladder muscles and I have been known to pee all over the place   Psoriasis    severe, began soriatane 01/2012   Pulmonary embolism (HCC)    Rectal fissure    Scoliosis    Wears glasses     ALLERGIES:  is allergic to phenergan  [promethazine  hcl], benicar  [olmesartan ], repatha  [evolocumab ], amlodipine , clarithromycin, codeine, erythromycin, hydralazine  hcl, statins, and tetracycline.  MEDICATIONS:  Current Outpatient Medications  Medication Sig Dispense Refill   Ascorbic Acid (VITAMIN C GUMMIE PO) Take 282 mg by mouth every evening. Each tablet 141 mg     calcium  carbonate (TUMS EX) 750 MG chewable tablet Chew 750 mg by mouth daily.     Cholecalciferol  (VITAMIN D3 GUMMIES ADULT PO) Take 2,000 Units by mouth every evening.     clindamycin  (CLEOCIN  T) 1 % external solution Apply 1 application topically daily as needed (wounds).      clobetasol  (TEMOVATE ) 0.05 % external solution Apply topically daily.     Coenzyme Q10 (COQ10 GUMMIES ADULT PO) Take 200 mg by mouth in the morning and at bedtime.     CREON  36000-114000 units CPEP capsule Take 36,000 Units by mouth 3 (three) times daily before meals.     diphenhydrAMINE  (BENADRYL ) 25 MG tablet Take  25 mg by mouth at bedtime.     ELIQUIS  5 MG TABS tablet TAKE 1 TABLET BY MOUTH TWICE A DAY 180 tablet 2   folic acid  (FOLVITE ) 1 MG tablet TAKE 1 TABLET BY MOUTH EVERY DAY 90 tablet 1   glucose blood (ONETOUCH ULTRA) test strip TEST DAILY AND AS NEEDED 300 strip 1   halobetasol  (ULTRAVATE ) 0.05 % cream APPLY TO AFFECTED AREA TWICE A DAY     lisinopril  (ZESTRIL ) 10 MG tablet TAKE 1 TABLET BY MOUTH EVERY DAY 90 tablet 1   Multiple Vitamins-Minerals (ADULT GUMMY PO) Take 1 tablet by mouth every evening. Children [employer] gummie formula     omeprazole  (PRILOSEC OTC) 20 MG tablet Take 20 mg by  mouth daily. Takes 10 mg in am and 10 mg in pm     ondansetron  (ZOFRAN -ODT) 8 MG disintegrating tablet TAKE 1 TABLET BY MOUTH EVERY 8 HOURS AS NEEDED FOR NAUSEA 60 tablet 2   oxymetazoline  (NASAL RELIEF) 0.05 % nasal spray Place into the nose.     polyethylene glycol (MIRALAX / GLYCOLAX) 17 g packet Take 17 g by mouth daily as needed.     rosuvastatin  (CRESTOR ) 5 MG tablet Take 1 tablet (5 mg total) by mouth 2 (two) times a week. Sometimes 3 times a week 45 tablet 1   Simethicone (GAS-X PO) Take 250 mg by mouth daily as needed.     VASCEPA  1 g capsule Take 1 capsule (1 g total) by mouth 2 (two) times daily. 180 capsule 2   vitamin B-12 (CYANOCOBALAMIN ) 1000 MCG tablet Take 1,000 mcg by mouth daily.     No current facility-administered medications for this visit.    SURGICAL HISTORY:  Past Surgical History:  Procedure Laterality Date   benign rectal growth  2004   removed by Dr. Lily   BIOPSY  08/09/2018   Procedure: BIOPSY;  Surgeon: Rollin Dover, MD;  Location: [facility] ENDOSCOPY;  Service: Endoscopy;;   BIOPSY  08/22/2018   Procedure: BIOPSY;  Surgeon: Kristie Lamprey, MD;  Location: [facility] ENDOSCOPY;  Service: Endoscopy;;   BIOPSY  03/07/2019   Procedure: BIOPSY;  Surgeon: Wilhelmenia Aloha Raddle., MD;  Location: [facility] ENDOSCOPY;  Service: Gastroenterology;;   BIOPSY  08/06/2020   Procedure: BIOPSY;  Surgeon: Wilhelmenia Aloha Raddle., MD;  Location: [facility] ENDOSCOPY;  Service: Gastroenterology;;   BREAST BIOPSY Right    BREAST EXCISIONAL BIOPSY Left    BREAST SURGERY  1988   biopsy   CESAREAN SECTION     COLONOSCOPY N/A 08/22/2018   Procedure: COLONOSCOPY;  Surgeon: Kristie Lamprey, MD;  Location: [facility] ENDOSCOPY;  Service: Endoscopy;  Laterality: N/A;   ENDOSCOPIC MUCOSAL RESECTION N/A 03/07/2019   Procedure: ENDOSCOPIC MUCOSAL RESECTION;  Surgeon: Wilhelmenia Aloha Raddle., MD;  Location: [facility] ENDOSCOPY;  Service: Gastroenterology;  Laterality: N/A;   ESOPHAGOGASTRODUODENOSCOPY (EGD) WITH PROPOFOL  N/A  08/04/2018   Procedure: ESOPHAGOGASTRODUODENOSCOPY (EGD) WITH PROPOFOL ;  Surgeon: Aneita Gwendlyn DASEN, MD;  Location: [facility] ENDOSCOPY;  Service: Gastroenterology;  Laterality: N/A;   ESOPHAGOGASTRODUODENOSCOPY (EGD) WITH PROPOFOL  N/A 03/07/2019   Procedure: ESOPHAGOGASTRODUODENOSCOPY (EGD) WITH PROPOFOL ;  Surgeon: Wilhelmenia Aloha Raddle., MD;  Location: [facility] ENDOSCOPY;  Service: Gastroenterology;  Laterality: N/A;   ESOPHAGOGASTRODUODENOSCOPY (EGD) WITH PROPOFOL  N/A 08/06/2020   Procedure: ESOPHAGOGASTRODUODENOSCOPY (EGD) WITH PROPOFOL ;  Surgeon: Wilhelmenia Aloha Raddle., MD;  Location: [facility] ENDOSCOPY;  Service: Gastroenterology;  Laterality: N/A;   EUS N/A 03/07/2019   Procedure: UPPER ENDOSCOPIC ULTRASOUND (EUS) RADIAL;  Surgeon: Wilhelmenia Aloha Raddle., MD;  Location: [facility] ENDOSCOPY;  Service:  Gastroenterology;  Laterality: N/A;   EUS N/A 08/06/2020   Procedure: UPPER ENDOSCOPIC ULTRASOUND (EUS) RADIAL;  Surgeon: Wilhelmenia Aloha Raddle., MD;  Location: [facility] ENDOSCOPY;  Service: Gastroenterology;  Laterality: N/A;   FINE NEEDLE ASPIRATION  03/07/2019   Procedure: FINE NEEDLE ASPIRATION (FNA) LINEAR;  Surgeon: Wilhelmenia Aloha Raddle., MD;  Location: [facility] ENDOSCOPY;  Service: Gastroenterology;;   FINE NEEDLE ASPIRATION  08/06/2020   Procedure: FINE NEEDLE ASPIRATION;  Surgeon: Wilhelmenia Aloha Raddle., MD;  Location: [facility] ENDOSCOPY;  Service: Gastroenterology;;   ENID SIGMOIDOSCOPY N/A 08/04/2018   Procedure: ENID MORIN;  Surgeon: Aneita Gwendlyn DASEN, MD;  Location: [facility] ENDOSCOPY;  Service: Gastroenterology;  Laterality: N/A;   FLEXIBLE SIGMOIDOSCOPY N/A 08/09/2018   Procedure: FLEXIBLE SIGMOIDOSCOPY;  Surgeon: Rollin Dover, MD;  Location: [facility] ENDOSCOPY;  Service: Endoscopy;  Laterality: N/A;   HEMOSTASIS CLIP PLACEMENT  03/07/2019   Procedure: HEMOSTASIS CLIP PLACEMENT;  Surgeon: Wilhelmenia Aloha Raddle., MD;  Location: [facility] ENDOSCOPY;  Service: Gastroenterology;;   SUBMUCOSAL LIFTING INJECTION  03/07/2019    Procedure: SUBMUCOSAL LIFTING INJECTION;  Surgeon: Wilhelmenia Aloha Raddle., MD;  Location: [facility] ENDOSCOPY;  Service: Gastroenterology;;   VIDEO BRONCHOSCOPY WITH ENDOBRONCHIAL NAVIGATION N/A 03/14/2018   Procedure: VIDEO BRONCHOSCOPY WITH ENDOBRONCHIAL NAVIGATION;  Surgeon: Shelah Lamar RAMAN, MD;  Location: [facility] OR;  Service: Thoracic;  Laterality: N/A;    REVIEW OF SYSTEMS:  Constitutional: negative Eyes: negative Ears, nose, mouth, throat, and face: negative Respiratory: negative Cardiovascular: negative Gastrointestinal: positive for dyspepsia, nausea, and vomiting Genitourinary:negative Integument/breast: negative Hematologic/lymphatic: negative Musculoskeletal:negative Neurological: negative Behavioral/Psych: negative Endocrine: negative Allergic/Immunologic: negative   PHYSICAL EXAMINATION: General appearance: alert, cooperative, fatigued, and no distress Head: Normocephalic, without obvious abnormality, atraumatic Neck: no adenopathy, no JVD, supple, symmetrical, trachea midline, and thyroid  not enlarged, symmetric, no tenderness/mass/nodules Lymph nodes: Cervical, supraclavicular, and axillary nodes normal. Resp: clear to auscultation bilaterally Back: symmetric, no curvature. ROM normal. No CVA tenderness. Cardio: regular rate and rhythm, S1, S2 normal, no murmur, click, rub or gallop GI: soft, non-tender; bowel sounds normal; no masses,  no organomegaly Extremities: extremities normal, atraumatic, no cyanosis or edema Neurologic: Alert and oriented X 3, normal strength and tone. Normal symmetric reflexes. Normal coordination and gait  ECOG PERFORMANCE STATUS: 1 - Symptomatic but completely ambulatory  Blood pressure 139/74, pulse 92, temperature (!) 97.4 F (36.3 C), resp. rate 17, height 5' 1 (1.549 m), weight 146 lb 14.4 oz (66.6 kg), SpO2 97%.  LABORATORY DATA: Lab Results  Component Value Date   WBC 7.0 10/25/2023   HGB 11.8 (L) 10/25/2023   HCT 36.6 10/25/2023   MCV  87.8 10/25/2023   PLT 315 10/25/2023      Chemistry      Component Value Date/Time   NA 139 10/25/2023 0905   NA 141 09/03/2018 0824   K 4.8 10/25/2023 0905   CL 102 10/25/2023 0905   CO2 30 10/25/2023 0905   BUN 22 10/25/2023 0905   BUN 8 09/03/2018 0824   CREATININE 1.11 (H) 10/25/2023 0905   CREATININE 1.08 (H) 11/05/2019 0844      Component Value Date/Time   CALCIUM  9.5 10/25/2023 0905   ALKPHOS 75 10/25/2023 0905   AST 29 10/25/2023 0905   ALT 31 10/25/2023 0905   BILITOT 0.4 10/25/2023 0905       RADIOGRAPHIC STUDIES: No results found.   ASSESSMENT AND PLAN: This is a very pleasant 78 years old white female with suspicious carcinoid tumor of the duodenum as well as a small pulmonary nodules.  The  patient has no clear pathology confirming her diagnosis but highly suspicious on the imaging studies. She had repeat PET dotatate scan on December 14, 2020 that showed focal radiotracer activity localized in the second portion of the duodenum corresponding to enhancing lesion on the comparison CT and consistent with a well-differentiated small neuroendocrine tumor.  There was no evidence of metastatic adenopathy in the abdomen and pelvis and no liver or mesenteric metastasis.  The patient had new bilateral pulmonary nodularity with discrete nodule in the right lung and peribronchial thickening and small nodularity in the right middle lobe and lingula but no activity associated with these lesions. She is followed by Dr. Shelah from pulmonary medicine. She was seen recently at [facility] by Dr. Ozell Seat as well as a GI surgeon Dr. Ozell Elder and both of them recommended for her to continue on observation. The patient is currently on observation and she has no concerning complaints. Repeat blood work is unremarkable except for the persistently elevated serum chromogranin. 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 Chromogranin level is slightly elevated but stable, indicating no significant progression. Inconclusive biopsies prevent definitive diagnosis, but clinical features suggest its presence. Potential metastasis to the liver, pancreas, gastrointestinal tract, or lung exists. - Continue observation unless significant symptoms develop such as night sweats, hot flashes, diarrhea, or significant weight loss. - Plan follow-up in six months with repeat blood work.  History of pulmonary embolism Previously treated with Eliquis . No current symptoms suggestive of recurrence such as chest pain or dyspnea.  Chronic nausea and vomiting Chronic nausea has increased to daily occurrences with weekly vomiting episodes. Ondansetron  is used daily, sometimes twice, for symptom management. Etiology is unclear, possibly related to the suspected carcinoid tumor. - Proceed with barium swallow test to evaluate for causes of choking and nausea.  Mild anemia Mild anemia with hemoglobin level at 11.8, slightly below normal. - Monitor hemoglobin levels during follow-up.  Chronic kidney disease, unspecified stage Indicated by serum creatinine level of 1.11, slightly above normal. Consistent finding likely related to history of kidney infections or UTIs. - Monitor kidney function during follow-up. She was advised to call immediately if she has any other concerning symptoms in the interval. The patient voices understanding of current disease status and treatment options and is in agreement with the current care plan.  All questions were answered. The patient knows to call the clinic with any problems, questions or concerns. We can certainly see the patient much sooner if necessary. The total time spent in the appointment was 30 minutes.   Disclaimer: This note was dictated with voice recognition software. Similar  sounding words can inadvertently be transcribed and may not be corrected upon review.

## 2023-11-06 ENCOUNTER — Other Ambulatory Visit (HOSPITAL_BASED_OUTPATIENT_CLINIC_OR_DEPARTMENT_OTHER): Payer: Self-pay

## 2023-11-06 MED ORDER — COMIRNATY 30 MCG/0.3ML IM SUSY
0.3000 mL | PREFILLED_SYRINGE | Freq: Once | INTRAMUSCULAR | 0 refills | Status: AC
  Filled 2023-11-06: qty 0.3, 1d supply, fill #0

## 2023-11-09 ENCOUNTER — Encounter (HOSPITAL_BASED_OUTPATIENT_CLINIC_OR_DEPARTMENT_OTHER): Payer: Self-pay | Admitting: Cardiology

## 2023-11-09 ENCOUNTER — Ambulatory Visit (INDEPENDENT_AMBULATORY_CARE_PROVIDER_SITE_OTHER): Admitting: Cardiology

## 2023-11-09 VITALS — BP 134/72 | HR 92 | Resp 17 | Ht 61.0 in | Wt 147.0 lb

## 2023-11-09 DIAGNOSIS — R0609 Other forms of dyspnea: Secondary | ICD-10-CM

## 2023-11-09 DIAGNOSIS — I10 Essential (primary) hypertension: Secondary | ICD-10-CM | POA: Diagnosis not present

## 2023-11-09 DIAGNOSIS — E785 Hyperlipidemia, unspecified: Secondary | ICD-10-CM | POA: Diagnosis not present

## 2023-11-09 DIAGNOSIS — Z7901 Long term (current) use of anticoagulants: Secondary | ICD-10-CM

## 2023-11-09 DIAGNOSIS — I2510 Atherosclerotic heart disease of native coronary artery without angina pectoris: Secondary | ICD-10-CM

## 2023-11-09 DIAGNOSIS — Z789 Other specified health status: Secondary | ICD-10-CM

## 2023-11-09 DIAGNOSIS — Z86711 Personal history of pulmonary embolism: Secondary | ICD-10-CM

## 2023-11-09 NOTE — Progress Notes (Signed)
 Cardiology Office Note:  .    Date:  11/09/2023  ID:  [name], DOB Feb 06, 1946, MRN [id number] PCP: Geofm Glade PARAS, MD  [facility] Providers Cardiologist:  Shelda Bruckner, MD     History of Present Illness: [name] [name] is a 78 y.o. female with a hx of mild nonobstructive CAD, aortic atherosclerosis, PE, hypertension, hyperlipidemia, here for follow-up. She is formerly a patient of Dr. Powell Sorrow, and she established care with me on 12/08/22.  CV history: Myoview  05/2021 was negative. TTE showed LVEF 60-65%, mild LVH, normal RV, no significant valve disease. Cardiac CTA 11/2017 with mild nonobstructive CAD. 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   Today: She has stopped medications and is gradually adding medications back in. She had two gout attacks and is concerned about it being vascepa . She stopped statin and vascepa  in June. Restarted taking vascepa  this week, just once/day. If she tolerates this, then she will restart statin. She has also changed her eating.   Had esophagram last week, noted large hiatal hernia. She does not want to have surgery if possible.  Notes fatigue. Minimally active. Walks around grocery store. Discussed gradually increasing activity as tolerated.  Checks blood pressure intermittently at home, doesn't have a log today but usually brings to her PCP visits.   ROS:  Denies chest pain, shortness of breath at rest. No PND, orthopnea, LE edema or unexpected weight gain. No syncope or palpitations. ROS otherwise negative except as noted.   Studies Reviewed: [name]    EKG Interpretation Date/Time:  Thursday November 09 2023 11:01:59 EDT Ventricular Rate:  84 PR Interval:  156 QRS Duration:  72 QT Interval:  366 QTC Calculation: 432 R Axis:   -14  Text Interpretation: Normal sinus rhythm Inferior infarct (cited on or before 09-Nov-2023) Confirmed by Bruckner Shelda  [phone]) on 11/09/2023 11:08:21 AM    Physical Exam:    VS:  BP 134/72   Pulse 92   Resp 17   Ht 5' 1 (1.549 m)   Wt 147 lb (66.7 kg)   SpO2 97%   BMI 27.78 kg/m    Wt Readings from Last 3 Encounters:  11/09/23 147 lb (66.7 kg)  11/01/23 146 lb 14.4 oz (66.6 kg)  06/15/23 150 lb 12.8 oz (68.4 kg)    GEN: Well nourished, well developed in no acute distress HEENT: Normal, moist mucous membranes NECK: No JVD CARDIAC: regular rhythm, normal S1 and S2, no rubs or gallops. No murmur. VASCULAR: Radial and DP pulses 2+ bilaterally. No carotid bruits RESPIRATORY:  Clear to auscultation without rales, wheezing or rhonchi  ABDOMEN: Soft, non-tender, non-distended MUSCULOSKELETAL:  Ambulates independently SKIN: Warm and dry, no edema NEUROLOGIC:  Alert and oriented x 3. No focal neuro deficits noted. PSYCHIATRIC:  Normal affect   ASSESSMENT AND PLAN: .    Nonobstructive CAD Aortic atherosclerosis Hypercholesterolemia Statin, PCKS9i intolerance -myoview  without ischemia, CT with nonobstructive disease -cannot tolerate other statins or repatha . Tolerates rosuvastatin  twice weekly but only if she takes vascepa  -she has had gout, concerned this was vascepa , so she stopped this and has only recently restarted. If she tolerates vasepa she will retrial the statin  Dyspnea on exertion Generalized fatigue -nonlimiting, discussed increasing activity  Hypertension -has mild cough on lisinopril , but it is manageable. She has not tolerated several other meds in the past. She is ok with continuing the lisinopril  for now, will contact us  if cough worsens  History  of PE, on lifelong anticoagulation -continue apixaban   Dispo: Follow-up in 6 months with APP and 1 year with me, or sooner as needed.  Signed, Shelda Bruckner, MD

## 2023-11-09 NOTE — Patient Instructions (Signed)
 Medication Instructions:  No changes *If you need a refill on your cardiac medications before your next appointment, please call your pharmacy*  Lab Work: none If you have labs (blood work) drawn today and your tests are completely normal, you will receive your results only by: MyChart Message (if you have MyChart) OR A paper copy in the mail If you have any lab test that is abnormal or we need to change your treatment, we will call you to review the results.  Testing/Procedures: none  Follow-Up: At [facility], you and your health needs are our priority.  As part of our continuing mission to provide you with exceptional heart care, our providers are all part of one team.  This team includes your primary Cardiologist (physician) and Advanced Practice Providers or APPs (Physician Assistants and Nurse Practitioners) who all work together to provide you with the care you need, when you need it.  Your next appointment:   6 month(s)  Provider:   Rosaline Bane, NP

## 2023-11-30 ENCOUNTER — Encounter

## 2023-12-01 ENCOUNTER — Other Ambulatory Visit (HOSPITAL_BASED_OUTPATIENT_CLINIC_OR_DEPARTMENT_OTHER): Payer: Self-pay

## 2023-12-01 ENCOUNTER — Encounter: Admitting: Internal Medicine

## 2023-12-01 MED ORDER — FLUZONE HIGH-DOSE 0.5 ML IM SUSY
0.5000 mL | PREFILLED_SYRINGE | Freq: Once | INTRAMUSCULAR | 0 refills | Status: AC
  Filled 2023-12-01: qty 0.5, 1d supply, fill #0

## 2023-12-10 ENCOUNTER — Other Ambulatory Visit: Payer: Self-pay | Admitting: Internal Medicine

## 2024-01-03 ENCOUNTER — Telehealth: Payer: Self-pay

## 2024-01-03 DIAGNOSIS — R918 Other nonspecific abnormal finding of lung field: Secondary | ICD-10-CM

## 2024-01-03 NOTE — Telephone Encounter (Signed)
 Copied from CRM [id number]. Topic: Clinical - Request for Lab/Test Order >> Jan 03, 2024 10:48 AM Celestine FALCON wrote: Reason for CRM: Pt saw Dr. Shelah on 12/15/2022 and he had notes stating to have a repeat CT scan of the chest without contrast in June of 2025, but I do not see an order in the pt's chart for the CT. There's also a note to schedule a follow up with him after the CT.   Please call the pt back at [phone] cannot leave a vm. Pt stated to call multiple times if possible.    Pt is overdue for f/u & CT scan. Dr. Shelah when would you like pt to have CT completed.

## 2024-01-03 NOTE — Telephone Encounter (Signed)
 Yes please order a CT scan of the chest with contrast to follow-up pulmonary nodules and then a follow-up with RB to review that study.

## 2024-01-03 NOTE — Telephone Encounter (Signed)
 Called and spoke with the pt. Pt has been ov for 12/18 to review scan. New order has been placed.   Routing to [facility], please schedule ASAP. Thanks!

## 2024-01-04 NOTE — Telephone Encounter (Signed)
 Spoke with patient regarding the Monday 01/08/24 9:20 CT Chest appointment at [facility] [address]--arrival time is 9:00 am with insurance cards and photo ID---24 hour cancellation. Reschedule notice to avoid $75.00 No Show fee. Patient voiced her understanding

## 2024-01-08 ENCOUNTER — Ambulatory Visit
Admission: RE | Admit: 2024-01-08 | Discharge: 2024-01-08 | Disposition: A | Discharge status: Home / Self Care | Source: Ambulatory Visit | Attending: Emergency Medicine | Admitting: Emergency Medicine

## 2024-01-08 DIAGNOSIS — R918 Other nonspecific abnormal finding of lung field: Secondary | ICD-10-CM

## 2024-01-15 ENCOUNTER — Encounter: Payer: Self-pay | Admitting: Internal Medicine

## 2024-01-15 NOTE — Patient Instructions (Addendum)

## 2024-01-15 NOTE — Progress Notes (Unsigned)
 Subjective:    Patient ID: [name], female    DOB: [birth year], 78 y.o.   MRN: [id number]      HPI [name] is here for a Physical exam and her chronic medical problems.   Large hiatal hernia.  Vomits a few days a week which is new  Eats 4 small meals.  Does not eat meat.    Having increased gout attacks - June and two since then.  She has changed her diet.  She does not want to take any more medication.  Increased back pain-think she probably needs to go back to see a specialist.  Increased sleeping problems.  Not falling asleep until 2-3 am.  Starting to take magnesium .  Does not want a sleep medication.   Feels tired most days - today she does not feel that way.    Medications and allergies reviewed with patient and updated if appropriate.  Current Outpatient Medications on File Prior to Visit  Medication Sig Dispense Refill   CREON  36000-114000 units CPEP capsule Take 36,000 Units by mouth 3 (three) times daily before meals.     Ascorbic Acid (VITAMIN C GUMMIE PO) Take 282 mg by mouth every evening. Each tablet 141 mg     calcium  carbonate (TUMS EX) 750 MG chewable tablet Chew 750 mg by mouth daily.     Cholecalciferol  (VITAMIN D3 GUMMIES ADULT PO) Take 2,000 Units by mouth every evening.     clobetasol  (TEMOVATE ) 0.05 % external solution Apply topically daily.     Coenzyme Q10 (COQ10 GUMMIES ADULT PO) Take 200 mg by mouth in the morning and at bedtime. (Patient not taking: Reported on 01/16/2024)     diphenhydrAMINE  (BENADRYL ) 25 MG tablet Take 25 mg by mouth at bedtime.     ELIQUIS  5 MG TABS tablet TAKE 1 TABLET BY MOUTH TWICE A DAY 180 tablet 2   glucose blood (ONETOUCH ULTRA) test strip TEST DAILY AND AS NEEDED 300 strip 1   halobetasol  (ULTRAVATE ) 0.05 % cream APPLY TO AFFECTED AREA TWICE A DAY     lisinopril  (ZESTRIL ) 10 MG tablet TAKE 1 TABLET BY MOUTH EVERY DAY 90 tablet 1   Multiple Vitamins-Minerals (ADULT GUMMY PO) Take 1 tablet by mouth every  evening. Children [employer] gummie formula     omeprazole  (PRILOSEC OTC) 20 MG tablet Take 20 mg by mouth daily. Takes 10 mg in am and 10 mg in pm     ondansetron  (ZOFRAN -ODT) 8 MG disintegrating tablet TAKE 1 TABLET BY MOUTH EVERY 8 HOURS AS NEEDED FOR NAUSEA 60 tablet 2   oxymetazoline  (NASAL RELIEF) 0.05 % nasal spray Place into the nose.     polyethylene glycol (MIRALAX / GLYCOLAX) 17 g packet Take 17 g by mouth daily as needed.     rosuvastatin  (CRESTOR ) 5 MG tablet Take 1 tablet (5 mg total) by mouth 2 (two) times a week. Sometimes 3 times a week (Patient not taking: Reported on 01/16/2024) 45 tablet 1   Simethicone (GAS-X PO) Take 250 mg by mouth daily as needed.     VASCEPA  1 g capsule Take 1 capsule (1 g total) by mouth 2 (two) times daily. (Patient not taking: Reported on 01/16/2024) 180 capsule 2   vitamin B-12 (CYANOCOBALAMIN ) 1000 MCG tablet Take 1,000 mcg by mouth daily.     No current facility-administered medications on file prior to visit.    Review of Systems  Constitutional:  Negative for fever.  Eyes:  Positive for visual disturbance.  Respiratory:  Positive for cough (related to lisinopril ) and shortness of breath. Negative for wheezing.   Cardiovascular:  Positive for leg swelling (sometimes). Negative for chest pain and palpitations.  Gastrointestinal:  Positive for abdominal pain (with vomiting), constipation (mild) and vomiting (3 / week). Negative for blood in stool and diarrhea.       Occ gerd  Genitourinary:  Negative for dysuria.  Musculoskeletal:  Positive for arthralgias and back pain (chronic - worse).  Skin:  Positive for rash (psoriasis).  Neurological:  Positive for light-headedness (rare). Negative for headaches.  Psychiatric/Behavioral:  Positive for sleep disturbance. Negative for dysphoric mood. The patient is nervous/anxious.        Objective:   Vitals:   01/16/24 1354  BP: 134/80  Pulse: 84  Temp: 98.4 F (36.9 C)  SpO2: 97%   Filed  Weights   01/16/24 1354  Weight: 143 lb (64.9 kg)   Body mass index is 27.02 kg/m.  BP Readings from Last 3 Encounters:  01/16/24 134/80  01/16/24 128/79  11/09/23 134/72    Wt Readings from Last 3 Encounters:  01/16/24 143 lb (64.9 kg)  01/16/24 147 lb (66.7 kg)  11/09/23 147 lb (66.7 kg)       Physical Exam Constitutional: She appears well-developed and well-nourished. No distress.  HENT:  Head: Normocephalic and atraumatic.  Right Ear: External ear normal. Normal ear canal and TM Left Ear: External ear normal.  Normal ear canal and TM Mouth/Throat: Oropharynx is clear and moist.  Eyes: Conjunctivae normal.  Neck: Neck supple. No tracheal deviation present. No thyromegaly present.  No carotid bruit  Cardiovascular: Normal rate, regular rhythm and normal heart sounds.   No murmur heard.  No edema. Pulmonary/Chest: Effort normal and breath sounds normal. No respiratory distress. She has no wheezes. She has no rales.  Breast: deferred   Abdominal: Soft. She exhibits no distension. There is no tenderness.  Lymphadenopathy: She has no cervical adenopathy.  Skin: Skin is warm and dry. She is not diaphoretic.  Psychiatric: She has a normal mood and affect. Her behavior is normal.     Lab Results  Component Value Date   WBC 7.0 10/25/2023   HGB 11.8 (L) 10/25/2023   HCT 36.6 10/25/2023   PLT 315 10/25/2023   GLUCOSE 114 (H) 10/25/2023   CHOL 214 (H) 04/14/2023   TRIG 145.0 04/14/2023   HDL 59.30 04/14/2023   LDLDIRECT 113.0 10/05/2021   LDLCALC 126 (H) 04/14/2023   ALT 31 10/25/2023   AST 29 10/25/2023   NA 139 10/25/2023   K 4.8 10/25/2023   CL 102 10/25/2023   CREATININE 1.11 (H) 10/25/2023   BUN 22 10/25/2023   CO2 30 10/25/2023   TSH 2.52 04/14/2023   INR 1.9 (H) 06/23/2020   HGBA1C 7.2 (H) 04/14/2023         Assessment & Plan:   Physical exam: Screening blood work  ordered Exercise  walks the store 5 days a week  Weight  is ok Substance abuse   none   Due for eye appt - will schedule Due for mammo - will schedule  Reviewed recommended immunizations.   Health Maintenance  Topic Date Due   Diabetic kidney evaluation - Urine ACR  Never done   OPHTHALMOLOGY EXAM  07/01/2017   FOOT EXAM  06/22/2023   HEMOGLOBIN A1C  10/12/2023   Zoster Vaccines- Shingrix (1 of 2) 02/11/2024 (Originally 10/23/1964)   DTaP/Tdap/Td (3 - Tdap) 06/02/2024   Diabetic kidney evaluation -  eGFR measurement  10/24/2024   Medicare Annual Wellness (AWV)  01/15/2025   Pneumococcal Vaccine: 50+ Years  Completed   Influenza Vaccine  Completed   Meningococcal B Vaccine  Aged Out   Mammogram  Discontinued   Bone Density Scan  Discontinued   Colonoscopy  Discontinued   COVID-19 Vaccine  Discontinued   Hepatitis C Screening  Discontinued          See Problem List for Assessment and Plan of chronic medical problems.

## 2024-01-16 ENCOUNTER — Ambulatory Visit

## 2024-01-16 ENCOUNTER — Ambulatory Visit: Admitting: Internal Medicine

## 2024-01-16 VITALS — BP 128/79 | HR 75 | Temp 98.1°F | Ht 61.0 in | Wt 147.0 lb

## 2024-01-16 VITALS — BP 134/80 | HR 84 | Temp 98.4°F | Ht 61.0 in | Wt 143.0 lb

## 2024-01-16 DIAGNOSIS — R600 Localized edema: Secondary | ICD-10-CM

## 2024-01-16 DIAGNOSIS — K8681 Exocrine pancreatic insufficiency: Secondary | ICD-10-CM

## 2024-01-16 DIAGNOSIS — E1169 Type 2 diabetes mellitus with other specified complication: Secondary | ICD-10-CM

## 2024-01-16 DIAGNOSIS — N1831 Chronic kidney disease, stage 3a: Secondary | ICD-10-CM

## 2024-01-16 DIAGNOSIS — K449 Diaphragmatic hernia without obstruction or gangrene: Secondary | ICD-10-CM | POA: Insufficient documentation

## 2024-01-16 DIAGNOSIS — E559 Vitamin D deficiency, unspecified: Secondary | ICD-10-CM

## 2024-01-16 DIAGNOSIS — Z86711 Personal history of pulmonary embolism: Secondary | ICD-10-CM

## 2024-01-16 DIAGNOSIS — E538 Deficiency of other specified B group vitamins: Secondary | ICD-10-CM

## 2024-01-16 DIAGNOSIS — E1159 Type 2 diabetes mellitus with other circulatory complications: Secondary | ICD-10-CM

## 2024-01-16 DIAGNOSIS — D3A8 Other benign neuroendocrine tumors: Secondary | ICD-10-CM

## 2024-01-16 DIAGNOSIS — K219 Gastro-esophageal reflux disease without esophagitis: Secondary | ICD-10-CM

## 2024-01-16 DIAGNOSIS — Z0000 Encounter for general adult medical examination without abnormal findings: Secondary | ICD-10-CM

## 2024-01-16 DIAGNOSIS — E1122 Type 2 diabetes mellitus with diabetic chronic kidney disease: Secondary | ICD-10-CM

## 2024-01-16 DIAGNOSIS — M109 Gout, unspecified: Secondary | ICD-10-CM

## 2024-01-16 NOTE — Assessment & Plan Note (Signed)
 Chronic Following with GI On Creon  Has intermittent abdominal pain, nausea and constipation and more recently has had vomiting episodes which is likely related to her large hiatal hernia

## 2024-01-16 NOTE — Assessment & Plan Note (Signed)
 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

## 2024-01-16 NOTE — Assessment & Plan Note (Signed)
 Chronic Taking vitamin D daily Check vitamin D level

## 2024-01-16 NOTE — Patient Instructions (Signed)
 [name],  Thank you for taking the time for your Medicare Wellness Visit. I appreciate your continued commitment to your health goals. Please review the care plan we discussed, and feel free to reach out if I can assist you further.  Please note that Annual Wellness Visits do not include a physical exam. Some assessments may be limited, especially if the visit was conducted virtually. If needed, we may recommend an in-person follow-up with your provider.  Ongoing Care Seeing your primary care provider every 3 to 6 months helps us  monitor your health and provide consistent, personalized care. You are due for an eye exam and a mammogram.  You are also due for a foot exam, a kidney evaluation (urine sample) and a A1C check, which all will be done during your office visit today with Dr. Geofm.  Each day, aim for 6 glasses of water, plenty of protein in your diet and try to get up and walk/ stretch every hour for 5-10 minutes at a time.    Referrals If a referral was made during today's visit and you haven't received any updates within two weeks, please contact the referred provider directly to check on the status.  Recommended Screenings:  Health Maintenance  Topic Date Due   Yearly kidney health urinalysis for diabetes  Never done   Eye exam for diabetics  07/01/2017   Complete foot exam   06/22/2023   Medicare Annual Wellness Visit  10/07/2023   Hemoglobin A1C  10/12/2023   Zoster (Shingles) Vaccine (1 of 2) 02/11/2024*   DTaP/Tdap/Td vaccine (3 - Tdap) 06/02/2024   Yearly kidney function blood test for diabetes  10/24/2024   Pneumococcal Vaccine for age over 65  Completed   Flu Shot  Completed   Meningitis B Vaccine  Aged Out   Breast Cancer Screening  Discontinued   Osteoporosis screening with Bone Density Scan  Discontinued   Colon Cancer Screening  Discontinued   COVID-19 Vaccine  Discontinued   Hepatitis C Screening  Discontinued  *Topic was postponed. The date shown is not the  original due date.       01/16/2024    8:22 AM  Advanced Directives  Does Patient Have a Medical Advance Directive? Yes  Type of Advance Directive Healthcare Power of Attorney    Vision: Annual vision screenings are recommended for early detection of glaucoma, cataracts, and diabetic retinopathy. These exams can also reveal signs of chronic conditions such as diabetes and high blood pressure.  Dental: Annual dental screenings help detect early signs of oral cancer, gum disease, and other conditions linked to overall health, including heart disease and diabetes.  Please see the attached documents for additional preventive care recommendations.

## 2024-01-16 NOTE — Assessment & Plan Note (Signed)
Chronic GERD?  Controlled Following with GI Continue omeprazole 20 mg OTC daily

## 2024-01-16 NOTE — Assessment & Plan Note (Signed)
 Chronic Associated with chronic kidney disease stage 3a, hypertension, hyperlipidemia  Lab Results  Component Value Date   HGBA1C 7.2 (H) 04/14/2023   Sugars adequately controlled for her age  She has not been compliant with a diabetic diet Check A1c, urine microalbumin/creatinine ratio Continue lifestyle control Stressed regular exercise, diabetic diet

## 2024-01-16 NOTE — Assessment & Plan Note (Signed)
 Large hiatal hernia She does eat 4 small meals a day Unfortunately in the last week or so has started with vomiting a few times a week This is associated with abdominal pain Follows with GI If symptoms persist may need to follow-up with them sooner

## 2024-01-16 NOTE — Assessment & Plan Note (Signed)
Chronic Taking vitamin B12 Check B12 level

## 2024-01-16 NOTE — Assessment & Plan Note (Signed)
 Chronic Blood pressure okay here Continue to monitor at home No change in medication Continue lisinopril  10 mg daily CMP

## 2024-01-16 NOTE — Progress Notes (Signed)
 Chief Complaint  Patient presents with   Medicare Wellness     Subjective:   [name] is a 78 y.o. female who presents for a Medicare Annual Wellness Visit.  Visit info / Clinical Intake: Medicare Wellness Visit Type:: Subsequent Annual Wellness Visit Persons participating in visit and providing information:: patient Medicare Wellness Visit Mode:: Telephone If telephone:: video declined Since this visit was completed virtually, some vitals may be partially provided or unavailable. Missing vitals are due to the limitations of the virtual format.: Documented vitals are patient reported If Telephone or Video please confirm:: I connected with patient using audio/video enable telemedicine. I verified patient identity with two identifiers, discussed telehealth limitations, and patient agreed to proceed. Patient Location:: Home Provider Location:: Home Interpreter Needed?: No Pre-visit prep was completed: no AWV questionnaire completed by patient prior to visit?: no Living arrangements:: (!) lives alone Patient's Overall Health Status Rating: (!) fair Typical amount of pain: none Does pain affect daily life?: no Are you currently prescribed opioids?: no  Dietary Habits and Nutritional Risks How many meals a day?: 4 (small meals) Eats fruit and vegetables daily?: (!) no Most meals are obtained by: preparing own meals In the last 2 weeks, have you had any of the following?: (!) nausea, vomiting, diarrhea Diabetic:: (!) yes Any non-healing wounds?: no How often do you check your BS?: as needed Would you like to be referred to a Nutritionist or for Diabetic Management? : no  Functional Status Activities of Daily Living (to include ambulation/medication): Independent Ambulation: Independent with device- listed below Home Assistive Devices/Equipment: Walker (specify Type); Eyeglasses Medication Administration: Independent Home Management (perform basic housework or  laundry): Independent Manage your own finances?: yes Primary transportation is: driving Concerns about vision?: no *vision screening is required for WTM* (per pt- worsening) Concerns about hearing?: no  Fall Screening Falls in the past year?: 1 Number of falls in past year: 0 (trip over dog's gate in house/Daughter's house) Was there an injury with Fall?: 0 Fall Risk Category Calculator: 1 Patient Fall Risk Level: Low Fall Risk  Fall Risk Patient at Risk for Falls Due to: No Fall Risks Fall risk Follow up: Falls evaluation completed; Falls prevention discussed  Home and Transportation Safety: All rugs have non-skid backing?: N/A, no rugs All stairs or steps have railings?: N/A, no stairs Grab bars in the bathtub or shower?: yes Have non-skid surface in bathtub or shower?: yes Good home lighting?: yes Regular seat belt use?: yes Hospital stays in the last year:: no  Cognitive Assessment Difficulty concentrating, remembering, or making decisions? : yes Will 6CIT or Mini Cog be Completed: yes What year is it?: 0 points What month is it?: 0 points Give patient an address phrase to remember (5 components): [address], [place] About what time is it?: 0 points Count backwards from 20 to 1: 0 points Say the months of the year in reverse: 0 points Repeat the address phrase from earlier: 0 points 6 CIT Score: 0 points  Advance Directives (For Healthcare) Does Patient Have a Medical Advance Directive?: Yes Type of Advance Directive: Healthcare Power of Attorney  Reviewed/Updated  Reviewed/Updated: Reviewed All (Medical, Surgical, Family, Medications, Allergies, Care Teams, Patient Goals)    Allergies (verified) Phenergan  [promethazine  hcl], Benicar  [olmesartan ], Repatha  [evolocumab ], Amlodipine , Clarithromycin, Codeine, Erythromycin, Hydralazine  hcl, Statins, and Tetracycline   Current Medications (verified) Outpatient Encounter Medications as of 01/16/2024  Medication  Sig   Ascorbic Acid (VITAMIN C GUMMIE PO) Take 282 mg  by mouth every evening. Each tablet 141 mg   calcium  carbonate (TUMS EX) 750 MG chewable tablet Chew 750 mg by mouth daily.   Cholecalciferol  (VITAMIN D3 GUMMIES ADULT PO) Take 2,000 Units by mouth every evening.   clobetasol  (TEMOVATE ) 0.05 % external solution Apply topically daily.   diphenhydrAMINE  (BENADRYL ) 25 MG tablet Take 25 mg by mouth at bedtime.   ELIQUIS  5 MG TABS tablet TAKE 1 TABLET BY MOUTH TWICE A DAY   halobetasol  (ULTRAVATE ) 0.05 % cream APPLY TO AFFECTED AREA TWICE A DAY   lisinopril  (ZESTRIL ) 10 MG tablet TAKE 1 TABLET BY MOUTH EVERY DAY   Multiple Vitamins-Minerals (ADULT GUMMY PO) Take 1 tablet by mouth every evening. Children [employer] gummie formula   omeprazole  (PRILOSEC OTC) 20 MG tablet Take 20 mg by mouth daily. Takes 10 mg in am and 10 mg in pm   ondansetron  (ZOFRAN -ODT) 8 MG disintegrating tablet TAKE 1 TABLET BY MOUTH EVERY 8 HOURS AS NEEDED FOR NAUSEA   oxymetazoline  (NASAL RELIEF) 0.05 % nasal spray Place into the nose.   polyethylene glycol (MIRALAX / GLYCOLAX) 17 g packet Take 17 g by mouth daily as needed.   Simethicone (GAS-X PO) Take 250 mg by mouth daily as needed.   vitamin B-12 (CYANOCOBALAMIN ) 1000 MCG tablet Take 1,000 mcg by mouth daily.   Coenzyme Q10 (COQ10 GUMMIES ADULT PO) Take 200 mg by mouth in the morning and at bedtime. (Patient not taking: Reported on 01/16/2024)   CREON  36000-114000 units CPEP capsule Take 36,000 Units by mouth 3 (three) times daily before meals. (Patient not taking: Reported on 01/16/2024)   glucose blood (ONETOUCH ULTRA) test strip TEST DAILY AND AS NEEDED   ondansetron  (ZOFRAN ) 4 MG tablet Take by mouth once. (Patient not taking: Reported on 01/16/2024)   rosuvastatin  (CRESTOR ) 5 MG tablet Take 1 tablet (5 mg total) by mouth 2 (two) times a week. Sometimes 3 times a week (Patient not taking: Reported on 01/16/2024)   VASCEPA  1 g capsule Take 1 capsule (1 g total) by mouth  2 (two) times daily. (Patient not taking: Reported on 01/16/2024)   No facility-administered encounter medications on file as of 01/16/2024.    History: Past Medical History:  Diagnosis Date   ALLERGIC RHINITIS    ANEMIA-NOS    Arthritis    ASTHMA    Breast discharge    Carpal tunnel syndrome    COLONIC POLYPS, HX OF    Coronary artery disease    mild CAD noted on 12/05/17 in coronary CT scan   DIABETES MELLITUS, TYPE II    diet controlled   GERD    Hand tingling    HYPERLIPIDEMIA    HYPERTENSION    Neuroendocrine tumor (HCC)    OSTEOPENIA    PONV (postoperative nausea and vomiting)     it relaxes my bladder muscles and I have been known to pee all over the place   Psoriasis    severe, began soriatane 01/2012   Pulmonary embolism (HCC)    Rectal fissure    Scoliosis    Wears glasses    Past Surgical History:  Procedure Laterality Date   benign rectal growth  2004   removed by Dr. Lily   BIOPSY  08/09/2018   Procedure: BIOPSY;  Surgeon: Rollin Dover, MD;  Location: [facility] ENDOSCOPY;  Service: Endoscopy;;   BIOPSY  08/22/2018   Procedure: BIOPSY;  Surgeon: Kristie Lamprey, MD;  Location: [facility] ENDOSCOPY;  Service: Endoscopy;;   BIOPSY  03/07/2019  Procedure: BIOPSY;  Surgeon: Wilhelmenia Aloha Raddle., MD;  Location: [facility] ENDOSCOPY;  Service: Gastroenterology;;   BIOPSY  08/06/2020   Procedure: BIOPSY;  Surgeon: Wilhelmenia Aloha Raddle., MD;  Location: [facility] ENDOSCOPY;  Service: Gastroenterology;;   BREAST BIOPSY Right    BREAST EXCISIONAL BIOPSY Left    BREAST SURGERY  1988   biopsy   CESAREAN SECTION     COLONOSCOPY N/A 08/22/2018   Procedure: COLONOSCOPY;  Surgeon: Kristie Lamprey, MD;  Location: [facility] ENDOSCOPY;  Service: Endoscopy;  Laterality: N/A;   ENDOSCOPIC MUCOSAL RESECTION N/A 03/07/2019   Procedure: ENDOSCOPIC MUCOSAL RESECTION;  Surgeon: Wilhelmenia Aloha Raddle., MD;  Location: [facility] ENDOSCOPY;  Service: Gastroenterology;  Laterality: N/A;   ESOPHAGOGASTRODUODENOSCOPY (EGD)  WITH PROPOFOL  N/A 08/04/2018   Procedure: ESOPHAGOGASTRODUODENOSCOPY (EGD) WITH PROPOFOL ;  Surgeon: Aneita Gwendlyn DASEN, MD;  Location: [facility] ENDOSCOPY;  Service: Gastroenterology;  Laterality: N/A;   ESOPHAGOGASTRODUODENOSCOPY (EGD) WITH PROPOFOL  N/A 03/07/2019   Procedure: ESOPHAGOGASTRODUODENOSCOPY (EGD) WITH PROPOFOL ;  Surgeon: Wilhelmenia Aloha Raddle., MD;  Location: [facility] ENDOSCOPY;  Service: Gastroenterology;  Laterality: N/A;   ESOPHAGOGASTRODUODENOSCOPY (EGD) WITH PROPOFOL  N/A 08/06/2020   Procedure: ESOPHAGOGASTRODUODENOSCOPY (EGD) WITH PROPOFOL ;  Surgeon: Wilhelmenia Aloha Raddle., MD;  Location: [facility] ENDOSCOPY;  Service: Gastroenterology;  Laterality: N/A;   EUS N/A 03/07/2019   Procedure: UPPER ENDOSCOPIC ULTRASOUND (EUS) RADIAL;  Surgeon: Wilhelmenia Aloha Raddle., MD;  Location: [facility] ENDOSCOPY;  Service: Gastroenterology;  Laterality: N/A;   EUS N/A 08/06/2020   Procedure: UPPER ENDOSCOPIC ULTRASOUND (EUS) RADIAL;  Surgeon: Wilhelmenia Aloha Raddle., MD;  Location: [facility] ENDOSCOPY;  Service: Gastroenterology;  Laterality: N/A;   FINE NEEDLE ASPIRATION  03/07/2019   Procedure: FINE NEEDLE ASPIRATION (FNA) LINEAR;  Surgeon: Wilhelmenia Aloha Raddle., MD;  Location: [facility] ENDOSCOPY;  Service: Gastroenterology;;   FINE NEEDLE ASPIRATION  08/06/2020   Procedure: FINE NEEDLE ASPIRATION;  Surgeon: Wilhelmenia Aloha Raddle., MD;  Location: [facility] ENDOSCOPY;  Service: Gastroenterology;;   ENID SIGMOIDOSCOPY N/A 08/04/2018   Procedure: ENID MORIN;  Surgeon: Aneita Gwendlyn DASEN, MD;  Location: [facility] ENDOSCOPY;  Service: Gastroenterology;  Laterality: N/A;   FLEXIBLE SIGMOIDOSCOPY N/A 08/09/2018   Procedure: FLEXIBLE SIGMOIDOSCOPY;  Surgeon: Rollin Dover, MD;  Location: [facility] ENDOSCOPY;  Service: Endoscopy;  Laterality: N/A;   HEMOSTASIS CLIP PLACEMENT  03/07/2019   Procedure: HEMOSTASIS CLIP PLACEMENT;  Surgeon: Wilhelmenia Aloha Raddle., MD;  Location: [facility] ENDOSCOPY;  Service: Gastroenterology;;   SUBMUCOSAL LIFTING INJECTION   03/07/2019   Procedure: SUBMUCOSAL LIFTING INJECTION;  Surgeon: Wilhelmenia Aloha Raddle., MD;  Location: [facility] ENDOSCOPY;  Service: Gastroenterology;;   VIDEO BRONCHOSCOPY WITH ENDOBRONCHIAL NAVIGATION N/A 03/14/2018   Procedure: VIDEO BRONCHOSCOPY WITH ENDOBRONCHIAL NAVIGATION;  Surgeon: Shelah Lamar RAMAN, MD;  Location: [facility] OR;  Service: Thoracic;  Laterality: N/A;   Family History  Problem Relation Age of Onset   Lung cancer Father    Arthritis Other        Parents   Asthma Other        parent, other relative   Breast cancer Other        other relative   Hypertension Other        parent, other relative   Heart disease Other        parent, other relative   Heart disease Mother    Asthma Mother    Breast cancer Maternal Aunt 70   Parkinson's disease Maternal Grandmother    Rheumatic fever Maternal Grandfather    Social History   Occupational History   Occupation: retired development worker, international aid  Tobacco  Use   Smoking status: Former    Current packs/day: 0.00    Average packs/day: 1 pack/day for 10.0 years (10.0 ttl pk-yrs)    Types: Cigarettes    Start date: 02/15/1964    Quit date: 02/14/1974    Years since quitting: 49.9   Smokeless tobacco: Never  Vaping Use   Vaping status: Never Used  Substance and Sexual Activity   Alcohol use: No   Drug use: No   Sexual activity: Not on file   Tobacco Counseling Counseling given: Not Answered  SDOH Screenings   Food Insecurity: No Food Insecurity (01/16/2024)  Housing: Unknown (01/16/2024)  Transportation Needs: No Transportation Needs (01/16/2024)  Utilities: Not At Risk (01/16/2024)  Alcohol Screen: Low Risk  (10/07/2022)  Depression (PHQ2-9): Medium Risk (01/16/2024)  Financial Resource Strain: Low Risk  (10/07/2022)  Physical Activity: Inactive (01/16/2024)  Social Connections: Socially Isolated (01/16/2024)  Stress: Stress Concern Present (01/16/2024)  Tobacco Use: Medium Risk (01/16/2024)  Health Literacy:  Adequate Health Literacy (01/16/2024)   See flowsheets for full screening details  Depression Screen PHQ 2 & 9 Depression Scale- Over the past 2 weeks, how often have you been bothered by any of the following problems? Little interest or pleasure in doing things: 1 (per pt- does not feel good) Feeling down, depressed, or hopeless (PHQ Adolescent also includes...irritable): 0 PHQ-2 Total Score: 1 Trouble falling or staying asleep, or sleeping too much: 1 Feeling tired or having little energy: 3 (feeling tired) Poor appetite or overeating (PHQ Adolescent also includes...weight loss): 0 Feeling bad about yourself - or that you are a failure or have let yourself or your family down: 0 Trouble concentrating on things, such as reading the newspaper or watching television (PHQ Adolescent also includes...like school work): 0 Moving or speaking so slowly that other people could have noticed. Or the opposite - being so fidgety or restless that you have been moving around a lot more than usual: 0 Thoughts that you would be better off dead, or of hurting yourself in some way: 0 PHQ-9 Total Score: 5 If you checked off any problems, how difficult have these problems made it for you to do your work, take care of things at home, or get along with other people?: Somewhat difficult (to do thing that she needs to do-per pt)     Goals Addressed               This Visit's Progress     Patient Stated (pt-stated)   Not on track     To increase my activity within my physical limitations.             Objective:    Today's Vitals   01/16/24 0812  BP: 128/79  Pulse: 75  Temp: 98.1 F (36.7 C)  SpO2: 97%  Weight: 147 lb (66.7 kg)  Height: 5' 1 (1.549 m)   Body mass index is 27.78 kg/m.  Hearing/Vision screen Hearing Screening - Comments:: Denies hearing difficulties   Vision Screening - Comments:: Wears eyeglasses/ [facility]/Not UTD Immunizations and Health Maintenance Health  Maintenance  Topic Date Due   Diabetic kidney evaluation - Urine ACR  Never done   OPHTHALMOLOGY EXAM  07/01/2017   FOOT EXAM  06/22/2023   HEMOGLOBIN A1C  10/12/2023   Zoster Vaccines- Shingrix (1 of 2) 02/11/2024 (Originally 10/23/1964)   DTaP/Tdap/Td (3 - Tdap) 06/02/2024   Diabetic kidney evaluation - eGFR measurement  10/24/2024   Medicare Annual Wellness (AWV)  01/15/2025  Pneumococcal Vaccine: 50+ Years  Completed   Influenza Vaccine  Completed   Meningococcal B Vaccine  Aged Out   Mammogram  Discontinued   Bone Density Scan  Discontinued   Colonoscopy  Discontinued   COVID-19 Vaccine  Discontinued   Hepatitis C Screening  Discontinued        Assessment/Plan:  This is a routine wellness examination for Chanelle.  Patient Care Team: Geofm Glade PARAS, MD as PCP - General (Internal Medicine) Lonni Slain, MD as PCP - Cardiology (Cardiology) Cary Doffing, MD (Dermatology) Cleotilde Sewer, OD (Optometry) Gust Royden ORN, MD (Orthopedic Surgery) Szabat, Toribio BROCKS, [facility] (Inactive) as Pharmacist (Pharmacist) Kendall Hoy Jansky, MD as Referring Physician (Gastroenterology) Jerrell Elspeth SAUNDERS, MD as Referring Physician  I have personally reviewed and noted the following in the patient's chart:   Medical and social history Use of alcohol, tobacco or illicit drugs  Current medications and supplements including opioid prescriptions. Functional ability and status Nutritional status Physical activity Advanced directives List of other physicians Hospitalizations, surgeries, and ER visits in previous 12 months Vitals Screenings to include cognitive, depression, and falls Referrals and appointments  No orders of the defined types were placed in this encounter.  In addition, I have reviewed and discussed with patient certain preventive protocols, quality metrics, and best practice recommendations. A written personalized care plan for preventive services as well as general  preventive health recommendations were provided to patient.   Ellice Boultinghouse L Terrica Duecker, CMA   01/16/2024   Return in 1 year (on 01/15/2025).  After Visit Summary: (MyChart) Due to this being a telephonic visit, the after visit summary with patients personalized plan was offered to patient via MyChart   Nurse Notes: Patient is due for a diabetic eye exam and has not had a mammogram this year yet.  Patient is also due for a foot exax, a UACR and a A1c check, which all can be done today during her office visit.  Patient stated that she has had several gout flares since her las visit in Feb 2025 and more stomach problems.

## 2024-01-16 NOTE — Assessment & Plan Note (Signed)
 Chronic Regular exercise and healthy diet encouraged, but she is limited in exercise and diet because of GI side effects Check lipid panel Not currently taking the Vascepa  or Crestor -plans on restarting the Vascepa  which will allow her to restart the Crestor  twice weekly

## 2024-01-16 NOTE — Assessment & Plan Note (Signed)
 History of low magnesium  Check level

## 2024-01-16 NOTE — Assessment & Plan Note (Signed)
 Chronic Has had at least 3 episodes of gout since she was here last Not wanted to take any other medication Will continue to work on her diet

## 2024-01-16 NOTE — Assessment & Plan Note (Signed)
Chronic Unprovoked On lifelong anticoagulation Continue Eliquis 5 mg twice daily CBC, CMP

## 2024-01-16 NOTE — Assessment & Plan Note (Signed)
 Chronic No longer seeing nephrology Mild, stable CMP, CBC

## 2024-01-16 NOTE — Assessment & Plan Note (Signed)
Chronic Controlled Stressed regular walking, elevating legs when sitting

## 2024-01-17 ENCOUNTER — Other Ambulatory Visit

## 2024-01-17 DIAGNOSIS — N1831 Chronic kidney disease, stage 3a: Secondary | ICD-10-CM

## 2024-01-17 DIAGNOSIS — E538 Deficiency of other specified B group vitamins: Secondary | ICD-10-CM

## 2024-01-17 DIAGNOSIS — E1122 Type 2 diabetes mellitus with diabetic chronic kidney disease: Secondary | ICD-10-CM | POA: Diagnosis not present

## 2024-01-17 DIAGNOSIS — E785 Hyperlipidemia, unspecified: Secondary | ICD-10-CM

## 2024-01-17 DIAGNOSIS — E1159 Type 2 diabetes mellitus with other circulatory complications: Secondary | ICD-10-CM

## 2024-01-17 DIAGNOSIS — E559 Vitamin D deficiency, unspecified: Secondary | ICD-10-CM

## 2024-01-17 DIAGNOSIS — I152 Hypertension secondary to endocrine disorders: Secondary | ICD-10-CM

## 2024-01-17 DIAGNOSIS — E1169 Type 2 diabetes mellitus with other specified complication: Secondary | ICD-10-CM

## 2024-01-17 LAB — CBC WITH DIFFERENTIAL/PLATELET
Basophils Absolute: 0.1 K/uL (ref 0.0–0.1)
Basophils Relative: 1.3 % (ref 0.0–3.0)
Eosinophils Absolute: 0.3 K/uL (ref 0.0–0.7)
Eosinophils Relative: 4.8 % (ref 0.0–5.0)
HCT: 36.7 % (ref 36.0–46.0)
Hemoglobin: 12 g/dL (ref 12.0–15.0)
Lymphocytes Relative: 25.8 % (ref 12.0–46.0)
Lymphs Abs: 1.7 K/uL (ref 0.7–4.0)
MCHC: 32.6 g/dL (ref 30.0–36.0)
MCV: 86.8 fl (ref 78.0–100.0)
Monocytes Absolute: 0.5 K/uL (ref 0.1–1.0)
Monocytes Relative: 7.8 % (ref 3.0–12.0)
Neutro Abs: 4.1 K/uL (ref 1.4–7.7)
Neutrophils Relative %: 60.3 % (ref 43.0–77.0)
Platelets: 305 K/uL (ref 150.0–400.0)
RBC: 4.22 Mil/uL (ref 3.87–5.11)
RDW: 16.8 % — ABNORMAL HIGH (ref 11.5–15.5)
WBC: 6.7 K/uL (ref 4.0–10.5)

## 2024-01-17 LAB — LIPID PANEL
Cholesterol: 266 mg/dL — ABNORMAL HIGH (ref 0–200)
HDL: 45.4 mg/dL (ref >39.00)
LDL Cholesterol: 182 mg/dL — ABNORMAL HIGH (ref 0–99)
NonHDL: 220.91
Total CHOL/HDL Ratio: 6
Triglycerides: 197 mg/dL — ABNORMAL HIGH (ref 0.0–149.0)
VLDL: 39.4 mg/dL (ref 0.0–40.0)

## 2024-01-17 LAB — COMPREHENSIVE METABOLIC PANEL WITH GFR
ALT: 29 U/L (ref 0–35)
AST: 28 U/L (ref 0–37)
Albumin: 3.9 g/dL (ref 3.5–5.2)
Alkaline Phosphatase: 67 U/L (ref 39–117)
BUN: 21 mg/dL (ref 6–23)
CO2: 29 meq/L (ref 19–32)
Calcium: 9.6 mg/dL (ref 8.4–10.5)
Chloride: 102 meq/L (ref 96–112)
Creatinine, Ser: 1.07 mg/dL (ref 0.40–1.20)
GFR: 49.15 mL/min — ABNORMAL LOW (ref >60.00)
Glucose, Bld: 118 mg/dL — ABNORMAL HIGH (ref 70–99)
Potassium: 4.1 meq/L (ref 3.5–5.1)
Sodium: 139 meq/L (ref 135–145)
Total Bilirubin: 0.6 mg/dL (ref 0.2–1.2)
Total Protein: 6.9 g/dL (ref 6.0–8.3)

## 2024-01-17 LAB — MAGNESIUM: Magnesium: 1.4 mg/dL — ABNORMAL LOW (ref 1.5–2.5)

## 2024-01-17 LAB — VITAMIN D 25 HYDROXY (VIT D DEFICIENCY, FRACTURES): VITD: 70.64 ng/mL (ref 30.00–100.00)

## 2024-01-17 LAB — MICROALBUMIN / CREATININE URINE RATIO
Creatinine,U: 182.1 mg/dL
Microalb Creat Ratio: 18.8 mg/g (ref 0.0–30.0)
Microalb, Ur: 3.4 mg/dL — ABNORMAL HIGH (ref 0.0–1.9)

## 2024-01-17 LAB — HEMOGLOBIN A1C: Hgb A1c MFr Bld: 6.6 % — ABNORMAL HIGH (ref 4.6–6.5)

## 2024-01-17 LAB — VITAMIN B12: Vitamin B-12: >1500 pg/mL — ABNORMAL HIGH (ref 211–911)

## 2024-01-20 ENCOUNTER — Ambulatory Visit: Payer: Self-pay | Admitting: Internal Medicine

## 2024-02-01 ENCOUNTER — Ambulatory Visit: Admitting: Emergency Medicine

## 2024-02-01 ENCOUNTER — Encounter: Payer: Self-pay | Admitting: Emergency Medicine

## 2024-02-01 VITALS — BP 132/70 | HR 99 | Temp 98.6°F | Ht 61.0 in | Wt 146.8 lb

## 2024-02-01 DIAGNOSIS — R918 Other nonspecific abnormal finding of lung field: Secondary | ICD-10-CM

## 2024-02-01 NOTE — Patient Instructions (Signed)
 We reviewed your CT scan of the chest today.  This is stable compared with your prior scans.  Good news. We will plan to repeat your CT chest in November 2026. Please follow Dr. Shelah in December 2026 so we can review your CT chest. Continue to follow with Dr. Sherrod and GI surgery as planned.

## 2024-02-01 NOTE — Assessment & Plan Note (Signed)
 We reviewed your CT scan of the chest today.  This is stable compared with your prior scans.  Good news. We will plan to repeat your CT chest in November 2026. Please follow Dr. Shelah in December 2026 so we can review your CT chest. Continue to follow with Dr. Sherrod and GI surgery as planned.

## 2024-02-01 NOTE — Progress Notes (Signed)
 Subjective:    Patient ID: [name], female    DOB: [birth year], 78 y.o.   MRN: [id number]  HPI  ROV 02/01/2024 --follow-up visit for 78 year old woman with a history of minimal former tobacco, psoriasis, PE, duodenal neuroendocrine tumor.  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  She follows with Dr. Sherrod.  She also has chronic cough in the setting of allergies.  She has been on observation with most recent imaging 01/08/2024 as below. She has fatigue, deals w hiatal hernia, has some cough on lisinopril . She has new problems w gout.   CT scan of the chest 01/04/2024 reviewed by me, shows stable 7 mm left upper lobe nodule, stable 4 mm left upper lobe nodule, stable 8 mm nodule, stable 4 mm right lower lobe nodule.  There is some left lower lobe atelectasis in the setting of a hiatal hernia   Review of Systems As per HPI  Past Medical History:  Diagnosis Date   ALLERGIC RHINITIS    ANEMIA-NOS    Arthritis    ASTHMA    Breast discharge    Carpal tunnel syndrome    COLONIC POLYPS, HX OF    Coronary artery disease    mild CAD noted on 12/05/17 in coronary CT scan   DIABETES MELLITUS, TYPE II    diet controlled   GERD    Hand tingling    HYPERLIPIDEMIA    HYPERTENSION    Neuroendocrine tumor (HCC)    OSTEOPENIA    PONV (postoperative nausea and vomiting)     it relaxes my bladder muscles and I have been known to pee all over the place   Psoriasis    severe, began soriatane 01/2012   Pulmonary embolism (HCC)    Rectal fissure    Scoliosis    Wears glasses      Family History  Problem Relation Age of Onset   Lung cancer Father    Arthritis Other        Parents   Asthma Other        parent, other relative   Breast cancer Other        other relative   Hypertension Other        parent, other relative   Heart disease Other        parent, other relative    Heart disease Mother    Asthma Mother    Breast cancer Maternal Aunt 53   Parkinson's disease Maternal Grandmother    Rheumatic fever Maternal Grandfather      Social History   Socioeconomic History   Marital status: Single    Spouse name: Not on file   Number of children: 1   Years of education: 16   Highest education level: Bachelor's degree (e.g., BA, AB, BS)  Occupational History   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  Retired development worker, international aid.  Education: college.    Social Drivers of Health   Tobacco Use: Medium Risk (02/01/2024)   Patient History    Smoking Tobacco Use: Former    Smokeless Tobacco Use: Never    Passive Exposure: Not on file  Financial Resource Strain: Low Risk (10/07/2022)   Overall Financial Resource Strain (CARDIA)    Difficulty of Paying Living Expenses: Not hard at all  Food Insecurity: No Food Insecurity (01/16/2024)   Epic    Worried About Programme Researcher, Broadcasting/film/video in the Last Year: Never true    Ran Out of Food in the Last Year: Never true  Transportation Needs: No Transportation Needs (01/16/2024)   Epic    Lack of Transportation (Medical): No    Lack of Transportation (Non-Medical): No  Physical Activity: Inactive (01/16/2024)   Exercise Vital Sign    Days of Exercise per Week: 0 days    Minutes of Exercise per Session: 0 min  Stress: Stress Concern Present  (01/16/2024)   [employer] of Occupational Health - Occupational Stress Questionnaire    Feeling of Stress: To some extent  Social Connections: Socially Isolated (01/16/2024)   Social Connection and Isolation Panel    Frequency of Communication with Friends and Family: More than three times a week    Frequency of Social Gatherings with Friends and Family: Once a week    Attends Religious Services: Never    Database Administrator or Organizations: No    Attends Banker Meetings: Never    Marital Status: Widowed  Intimate Partner Violence: Patient Unable To Answer (01/16/2024)   Epic    Fear of Current or Ex-Partner: Patient unable to answer    Emotionally Abused: Patient unable to answer    Physically Abused: Patient unable to answer    Sexually Abused: Patient unable to answer  Depression (PHQ2-9): Medium Risk (01/16/2024)   Depression (PHQ2-9)    PHQ-2 Score: 5  Alcohol Screen: Low Risk (10/07/2022)   Alcohol Screen    Last Alcohol Screening Score (AUDIT): 0  Housing: Unknown (01/16/2024)   Epic    Unable to Pay for Housing in the Last Year: No    Number of Times Moved in the Last Year: Not on file    Homeless in the Last Year: No  Utilities: Not At Risk (01/16/2024)   Epic    Threatened with loss of utilities: No  Health Literacy: Adequate Health Literacy (01/16/2024)   B1300 Health Literacy    Frequency of need for help with medical instructions: Never     Allergies  Allergen Reactions   Phenergan  [Promethazine  Hcl] Shortness Of Breath and Anxiety   Benicar  [Olmesartan ] Other (See Comments)    Colitis-sprue enteropathy   Repatha  [Evolocumab ]     Muscle pain and weakness   Amlodipine  Swelling   Clarithromycin Nausea And Vomiting    May have episodes of syncope due to prolonged vomiting    Codeine Nausea And Vomiting    May have episodes of syncope due to prolonged vomiting   Erythromycin Nausea And Vomiting   Hydralazine  Hcl     Drug-induced lupus - pt  wishes to avoid   Statins     Muscle pain    Tetracycline Nausea And Vomiting     Outpatient Medications Prior to Visit  Medication Sig Dispense  Refill   Ascorbic Acid (VITAMIN C GUMMIE PO) Take 282 mg by mouth every evening. Each tablet 141 mg     calcium  carbonate (TUMS EX) 750 MG chewable tablet Chew 750 mg by mouth daily.     Cholecalciferol  (VITAMIN D3 GUMMIES ADULT PO) Take 2,000 Units by mouth every evening.     clobetasol  (TEMOVATE ) 0.05 % external solution Apply topically daily.     CREON  36000-114000 units CPEP capsule Take 36,000 Units by mouth 3 (three) times daily before meals.     diphenhydrAMINE  (BENADRYL ) 25 MG tablet Take 25 mg by mouth at bedtime.     ELIQUIS  5 MG TABS tablet TAKE 1 TABLET BY MOUTH TWICE A DAY 180 tablet 2   glucose blood (ONETOUCH ULTRA) test strip TEST DAILY AND AS NEEDED 300 strip 1   halobetasol  (ULTRAVATE ) 0.05 % cream APPLY TO AFFECTED AREA TWICE A DAY     lisinopril  (ZESTRIL ) 10 MG tablet TAKE 1 TABLET BY MOUTH EVERY DAY 90 tablet 1   Multiple Vitamins-Minerals (ADULT GUMMY PO) Take 1 tablet by mouth every evening. Children [employer] gummie formula     omeprazole  (PRILOSEC OTC) 20 MG tablet Take 20 mg by mouth daily. Takes 10 mg in am and 10 mg in pm     ondansetron  (ZOFRAN -ODT) 8 MG disintegrating tablet TAKE 1 TABLET BY MOUTH EVERY 8 HOURS AS NEEDED FOR NAUSEA 60 tablet 2   oxymetazoline  (NASAL RELIEF) 0.05 % nasal spray Place into the nose.     polyethylene glycol (MIRALAX / GLYCOLAX) 17 g packet Take 17 g by mouth daily as needed.     Simethicone (GAS-X PO) Take 250 mg by mouth daily as needed.     vitamin B-12 (CYANOCOBALAMIN ) 1000 MCG tablet Take 1,000 mcg by mouth daily.     Coenzyme Q10 (COQ10 GUMMIES ADULT PO) Take 200 mg by mouth in the morning and at bedtime. (Patient not taking: Reported on 02/01/2024)     rosuvastatin  (CRESTOR ) 5 MG tablet Take 1 tablet (5 mg total) by mouth 2 (two) times a week. Sometimes 3 times a week (Patient not  taking: Reported on 01/16/2024) 45 tablet 1   VASCEPA  1 g capsule Take 1 capsule (1 g total) by mouth 2 (two) times daily. (Patient not taking: Reported on 02/01/2024) 180 capsule 2   No facility-administered medications prior to visit.         Objective:   Physical Exam  Vitals:   02/01/24 1020  BP: 132/70  Pulse: 99  Temp: 98.6 F (37 C)  SpO2: (!) 86%  Weight: 146 lb 12.8 oz (66.6 kg)  Height: 5' 1 (1.549 m)   Gen: Pleasant, overweight woman, in no distress,  normal affect  ENT: No lesions,  mouth clear,  oropharynx clear, no postnasal drip  Neck: No JVD, no stridor  Lungs: No use of accessory muscles, no crackles or wheezing on normal respiration, no wheeze on forced expiration  Cardiovascular: RRR, heart sounds normal, no murmur or gallops, no peripheral edema  Musculoskeletal: No deformities, no cyanosis or clubbing  Neuro: alert, awake, non focal  Skin: Warm, no lesions or rash     Assessment & Plan:   Multiple pulmonary nodules determined by computed tomography of lung We reviewed your CT scan of the chest today.  This is stable compared with your prior scans.  Good news. We will plan to repeat your CT chest in November 2026. Please follow Dr. Shelah in December 2026 so we can review  your CT chest. Continue to follow with Dr. Sherrod and GI surgery as planned.   I personally spent a total of 20 minutes in the care of the patient today including preparing to see the patient, getting/reviewing separately obtained history, performing a medically appropriate exam/evaluation, counseling and educating, placing orders, documenting clinical information in the EHR, independently interpreting results, and communicating results.   Lamar Chris, MD, PhD 02/01/2024, 10:44 AM [facility] [phone] or if no answer before 7:00PM call [phone] For any issues after 7:00PM please call eLink [phone]

## 2024-03-22 ENCOUNTER — Other Ambulatory Visit: Payer: Self-pay | Admitting: Internal Medicine

## 2024-04-24 ENCOUNTER — Other Ambulatory Visit

## 2024-05-01 ENCOUNTER — Ambulatory Visit: Admitting: Internal Medicine

## 2024-05-08 ENCOUNTER — Ambulatory Visit (HOSPITAL_BASED_OUTPATIENT_CLINIC_OR_DEPARTMENT_OTHER): Admitting: Cardiology

## 2024-07-12 ENCOUNTER — Ambulatory Visit: Admitting: Internal Medicine

## 2025-01-06 ENCOUNTER — Other Ambulatory Visit
